# Patient Record
Sex: Male | Born: 1955
Health system: Southern US, Community
[De-identification: ages and names within clinical notes are randomized; demographics above are authoritative.]

## PROBLEM LIST (undated history)

## (undated) DIAGNOSIS — N189 Chronic kidney disease, unspecified: Secondary | ICD-10-CM

## (undated) DIAGNOSIS — I10 Essential (primary) hypertension: Secondary | ICD-10-CM

## (undated) DIAGNOSIS — D649 Anemia, unspecified: Secondary | ICD-10-CM

## (undated) DIAGNOSIS — G4733 Obstructive sleep apnea (adult) (pediatric): Secondary | ICD-10-CM

## (undated) DIAGNOSIS — G709 Myoneural disorder, unspecified: Secondary | ICD-10-CM

## (undated) DIAGNOSIS — K219 Gastro-esophageal reflux disease without esophagitis: Secondary | ICD-10-CM

## (undated) DIAGNOSIS — Z87442 Personal history of urinary calculi: Secondary | ICD-10-CM

## (undated) DIAGNOSIS — G4730 Sleep apnea, unspecified: Secondary | ICD-10-CM

## (undated) DIAGNOSIS — M1990 Unspecified osteoarthritis, unspecified site: Secondary | ICD-10-CM

## (undated) DIAGNOSIS — M810 Age-related osteoporosis without current pathological fracture: Secondary | ICD-10-CM

## (undated) DIAGNOSIS — L905 Scar conditions and fibrosis of skin: Secondary | ICD-10-CM

## (undated) DIAGNOSIS — M4800 Spinal stenosis, site unspecified: Secondary | ICD-10-CM

## (undated) DIAGNOSIS — L282 Other prurigo: Secondary | ICD-10-CM

## (undated) HISTORY — DX: Anemia, unspecified: D64.9

## (undated) HISTORY — DX: Unspecified osteoarthritis, unspecified site: M19.90

## (undated) HISTORY — PX: SPINE SURGERY: SHX786

## (undated) HISTORY — DX: Obstructive sleep apnea (adult) (pediatric): G47.33

## (undated) HISTORY — DX: Myoneural disorder, unspecified: G70.9

## (undated) HISTORY — DX: Sleep apnea, unspecified: G47.30

## (undated) HISTORY — DX: Morbid (severe) obesity due to excess calories: E66.01

## (undated) HISTORY — PX: CIRCUMCISION: SUR203

## (undated) HISTORY — DX: Spinal stenosis, site unspecified: M48.00

---

## 2000-10-14 DIAGNOSIS — L282 Other prurigo: Secondary | ICD-10-CM

## 2000-10-14 HISTORY — DX: Other prurigo: L28.2

## 2003-03-16 ENCOUNTER — Encounter: Payer: Self-pay | Admitting: Pulmonary Disease

## 2003-04-11 ENCOUNTER — Encounter: Payer: Self-pay | Admitting: Pulmonary Disease

## 2003-07-07 ENCOUNTER — Ambulatory Visit (HOSPITAL_COMMUNITY): Admission: RE | Admit: 2003-07-07 | Discharge: 2003-07-07 | Payer: Self-pay | Admitting: *Deleted

## 2003-07-07 ENCOUNTER — Encounter (INDEPENDENT_AMBULATORY_CARE_PROVIDER_SITE_OTHER): Payer: Self-pay | Admitting: Specialist

## 2008-03-07 ENCOUNTER — Inpatient Hospital Stay (HOSPITAL_COMMUNITY): Admission: EM | Admit: 2008-03-07 | Discharge: 2008-03-13 | Payer: Self-pay | Admitting: Emergency Medicine

## 2008-03-11 ENCOUNTER — Ambulatory Visit: Payer: Self-pay | Admitting: Vascular Surgery

## 2008-03-11 ENCOUNTER — Encounter (INDEPENDENT_AMBULATORY_CARE_PROVIDER_SITE_OTHER): Payer: Self-pay | Admitting: Internal Medicine

## 2008-08-07 ENCOUNTER — Emergency Department (HOSPITAL_COMMUNITY): Admission: EM | Admit: 2008-08-07 | Discharge: 2008-08-07 | Payer: Self-pay | Admitting: Emergency Medicine

## 2008-08-10 ENCOUNTER — Inpatient Hospital Stay (HOSPITAL_COMMUNITY): Admission: EM | Admit: 2008-08-10 | Discharge: 2008-08-23 | Payer: Self-pay | Admitting: Emergency Medicine

## 2008-08-10 ENCOUNTER — Ambulatory Visit: Payer: Self-pay | Admitting: Pulmonary Disease

## 2008-08-15 ENCOUNTER — Ambulatory Visit: Payer: Self-pay | Admitting: Thoracic Surgery

## 2008-08-30 ENCOUNTER — Ambulatory Visit: Payer: Self-pay | Admitting: Pulmonary Disease

## 2008-08-30 DIAGNOSIS — J869 Pyothorax without fistula: Secondary | ICD-10-CM | POA: Insufficient documentation

## 2008-08-30 DIAGNOSIS — G4730 Sleep apnea, unspecified: Secondary | ICD-10-CM | POA: Insufficient documentation

## 2008-08-30 DIAGNOSIS — L282 Other prurigo: Secondary | ICD-10-CM | POA: Insufficient documentation

## 2008-08-31 ENCOUNTER — Telehealth (INDEPENDENT_AMBULATORY_CARE_PROVIDER_SITE_OTHER): Payer: Self-pay | Admitting: *Deleted

## 2009-02-08 ENCOUNTER — Ambulatory Visit (HOSPITAL_COMMUNITY): Admission: RE | Admit: 2009-02-08 | Discharge: 2009-02-08 | Payer: Self-pay | Admitting: Urology

## 2009-02-08 ENCOUNTER — Encounter (INDEPENDENT_AMBULATORY_CARE_PROVIDER_SITE_OTHER): Payer: Self-pay | Admitting: Urology

## 2009-03-01 ENCOUNTER — Ambulatory Visit (HOSPITAL_COMMUNITY): Admission: RE | Admit: 2009-03-01 | Discharge: 2009-03-01 | Payer: Self-pay | Admitting: Family Medicine

## 2009-03-01 ENCOUNTER — Encounter (INDEPENDENT_AMBULATORY_CARE_PROVIDER_SITE_OTHER): Payer: Self-pay | Admitting: Family Medicine

## 2009-03-01 ENCOUNTER — Ambulatory Visit: Payer: Self-pay | Admitting: Cardiovascular Disease

## 2009-05-28 ENCOUNTER — Encounter: Admission: RE | Admit: 2009-05-28 | Discharge: 2009-05-28 | Payer: Self-pay | Admitting: Family Medicine

## 2010-01-01 ENCOUNTER — Encounter: Admission: RE | Admit: 2010-01-01 | Discharge: 2010-01-01 | Payer: Self-pay | Admitting: Specialist

## 2010-01-23 ENCOUNTER — Inpatient Hospital Stay (HOSPITAL_COMMUNITY): Admission: RE | Admit: 2010-01-23 | Discharge: 2010-02-07 | Payer: Self-pay | Admitting: Specialist

## 2010-01-23 HISTORY — PX: BACK SURGERY: SHX140

## 2010-01-25 ENCOUNTER — Ambulatory Visit: Payer: Self-pay | Admitting: Physical Medicine & Rehabilitation

## 2010-01-26 ENCOUNTER — Ambulatory Visit: Payer: Self-pay | Admitting: Infectious Diseases

## 2010-11-04 ENCOUNTER — Encounter: Payer: Self-pay | Admitting: Specialist

## 2011-01-01 LAB — BASIC METABOLIC PANEL
CO2: 26 mEq/L (ref 19–32)
Calcium: 8.4 mg/dL (ref 8.4–10.5)
Creatinine, Ser: 1.06 mg/dL (ref 0.4–1.5)
GFR calc Af Amer: >60 mL/min (ref >60)
Glucose, Bld: 114 mg/dL — ABNORMAL HIGH (ref 70–99)
Potassium: 3.5 mEq/L (ref 3.5–5.1)
Sodium: 137 mEq/L (ref 135–145)

## 2011-01-01 LAB — GLUCOSE, CAPILLARY
Glucose-Capillary: 103 mg/dL — ABNORMAL HIGH (ref 70–99)
Glucose-Capillary: 104 mg/dL — ABNORMAL HIGH (ref 70–99)
Glucose-Capillary: 105 mg/dL — ABNORMAL HIGH (ref 70–99)
Glucose-Capillary: 107 mg/dL — ABNORMAL HIGH (ref 70–99)
Glucose-Capillary: 109 mg/dL — ABNORMAL HIGH (ref 70–99)
Glucose-Capillary: 109 mg/dL — ABNORMAL HIGH (ref 70–99)
Glucose-Capillary: 117 mg/dL — ABNORMAL HIGH (ref 70–99)
Glucose-Capillary: 90 mg/dL (ref 70–99)
Glucose-Capillary: 94 mg/dL (ref 70–99)

## 2011-01-01 LAB — CBC
HCT: 27.6 % — ABNORMAL LOW (ref 39.0–52.0)
Hemoglobin: 9.2 g/dL — ABNORMAL LOW (ref 13.0–17.0)
MCHC: 33.2 g/dL (ref 30.0–36.0)
MCV: 77.8 fL — ABNORMAL LOW (ref 78.0–100.0)
MCV: 78 fL (ref 78.0–100.0)
Platelets: 322 10*3/uL (ref 150–400)
RDW: 16.3 % — ABNORMAL HIGH (ref 11.5–15.5)
WBC: 7.1 10*3/uL (ref 4.0–10.5)

## 2011-01-01 LAB — HIV ANTIBODY (ROUTINE TESTING W REFLEX): HIV: NONREACTIVE

## 2011-01-01 LAB — VANCOMYCIN, TROUGH: Vancomycin Tr: 34.1 ug/mL (ref 10.0–20.0)

## 2011-01-02 LAB — CBC
HCT: 31.2 % — ABNORMAL LOW (ref 39.0–52.0)
HCT: 31.6 % — ABNORMAL LOW (ref 39.0–52.0)
HCT: 38.6 % — ABNORMAL LOW (ref 39.0–52.0)
Hemoglobin: 10.3 g/dL — ABNORMAL LOW (ref 13.0–17.0)
Hemoglobin: 12.5 g/dL — ABNORMAL LOW (ref 13.0–17.0)
Platelets: 160 10*3/uL (ref 150–400)
Platelets: 249 10*3/uL (ref 150–400)
RBC: 4 MIL/uL — ABNORMAL LOW (ref 4.22–5.81)
RBC: 4.05 MIL/uL — ABNORMAL LOW (ref 4.22–5.81)
RDW: 16.7 % — ABNORMAL HIGH (ref 11.5–15.5)
RDW: 16.8 % — ABNORMAL HIGH (ref 11.5–15.5)
WBC: 4.5 10*3/uL (ref 4.0–10.5)
WBC: 5.8 10*3/uL (ref 4.0–10.5)
WBC: 6.6 10*3/uL (ref 4.0–10.5)

## 2011-01-02 LAB — COMPREHENSIVE METABOLIC PANEL
ALT: 14 U/L (ref 0–53)
AST: 17 U/L (ref 0–37)
Albumin: 3.9 g/dL (ref 3.5–5.2)
Alkaline Phosphatase: 99 U/L (ref 39–117)
BUN: 14 mg/dL (ref 6–23)
Chloride: 105 mEq/L (ref 96–112)
Potassium: 3.9 mEq/L (ref 3.5–5.1)
Sodium: 141 mEq/L (ref 135–145)
Total Bilirubin: 0.7 mg/dL (ref 0.3–1.2)
Total Protein: 7.3 g/dL (ref 6.0–8.3)

## 2011-01-02 LAB — DIFFERENTIAL
Basophils Absolute: 0 10*3/uL (ref 0.0–0.1)
Basophils Relative: 1 % (ref 0–1)
Eosinophils Absolute: 0.2 10*3/uL (ref 0.0–0.7)
Eosinophils Relative: 5 % (ref 0–5)
Monocytes Absolute: 0.5 10*3/uL (ref 0.1–1.0)
Monocytes Relative: 10 % (ref 3–12)
Neutro Abs: 2.6 10*3/uL (ref 1.7–7.7)

## 2011-01-02 LAB — BASIC METABOLIC PANEL
BUN: 12 mg/dL (ref 6–23)
CO2: 28 mEq/L (ref 19–32)
Chloride: 104 mEq/L (ref 96–112)
Creatinine, Ser: 0.87 mg/dL (ref 0.4–1.5)
GFR calc Af Amer: >60 mL/min (ref >60)
GFR calc Af Amer: >60 mL/min (ref >60)
GFR calc non Af Amer: >60 mL/min (ref >60)
Potassium: 3.5 mEq/L (ref 3.5–5.1)
Potassium: 4 mEq/L (ref 3.5–5.1)

## 2011-01-02 LAB — TYPE AND SCREEN
ABO/RH(D): A POS
Antibody Screen: NEGATIVE
Antibody Screen: NEGATIVE

## 2011-01-02 LAB — ABO/RH: ABO/RH(D): A POS

## 2011-01-02 LAB — URINALYSIS, ROUTINE W REFLEX MICROSCOPIC
Glucose, UA: NEGATIVE mg/dL
Hgb urine dipstick: NEGATIVE
Nitrite: NEGATIVE
Protein, ur: NEGATIVE mg/dL
Specific Gravity, Urine: 1.012 (ref 1.005–1.030)
Specific Gravity, Urine: 1.031 — ABNORMAL HIGH (ref 1.005–1.030)
Urobilinogen, UA: 1 mg/dL (ref 0.0–1.0)
pH: 5.5 (ref 5.0–8.0)

## 2011-01-02 LAB — URINE MICROSCOPIC-ADD ON

## 2011-01-02 LAB — URINE CULTURE

## 2011-01-02 LAB — PROTIME-INR: Prothrombin Time: 13 seconds (ref 11.6–15.2)

## 2011-01-02 LAB — MRSA PCR SCREENING: MRSA by PCR: NEGATIVE

## 2011-01-02 LAB — POCT I-STAT 4, (NA,K, GLUC, HGB,HCT)
Hemoglobin: 10.5 g/dL — ABNORMAL LOW (ref 13.0–17.0)
Potassium: 4 mEq/L (ref 3.5–5.1)

## 2011-01-23 ENCOUNTER — Inpatient Hospital Stay (INDEPENDENT_AMBULATORY_CARE_PROVIDER_SITE_OTHER)
Admission: RE | Admit: 2011-01-23 | Discharge: 2011-01-23 | Disposition: A | Discharge status: Home / Self Care | Payer: Federal, State, Local not specified - PPO | Source: Ambulatory Visit | Attending: Family Medicine | Admitting: Family Medicine

## 2011-01-23 DIAGNOSIS — R509 Fever, unspecified: Secondary | ICD-10-CM

## 2011-01-23 LAB — CBC
HCT: 36.4 % — ABNORMAL LOW (ref 39.0–52.0)
MCHC: 32.5 g/dL (ref 30.0–36.0)
MCV: 76.9 fL — ABNORMAL LOW (ref 78.0–100.0)
Platelets: 218 10*3/uL (ref 150–400)
RBC: 4.74 MIL/uL (ref 4.22–5.81)

## 2011-01-23 LAB — BASIC METABOLIC PANEL
BUN: 16 mg/dL (ref 6–23)
CO2: 27 mEq/L (ref 19–32)
Chloride: 103 mEq/L (ref 96–112)
Creatinine, Ser: 0.79 mg/dL (ref 0.4–1.5)
Potassium: 3.6 mEq/L (ref 3.5–5.1)

## 2011-02-26 NOTE — Op Note (Signed)
[name] [name], [name] [name]                ACCOUNT NO.:  [id number]   MEDICAL RECORD NO.:  [id number]          PATIENT TYPE:  AMB   LOCATION:  DAY                          FACILITY:  [facility]   PHYSICIAN:  [name] [name], M.D.  DATE OF BIRTH:  [birth year]   DATE OF PROCEDURE:  02/08/2009  DATE OF DISCHARGE:                               OPERATIVE REPORT   PREOPERATIVE DIAGNOSIS:  Phimosis secondary to massive penile skin  edema.   POSTOPERATIVE DIAGNOSIS:  Phimosis secondary to massive penile skin  edema.   PROCEDURE PERFORMED:  Complex circumcision.   SURGEON:  [name] [name], M.D.   RESIDENTGeorgeanna [name].   ANESTHESIA:  MAC.   DRAINS:  None.   COMPLICATIONS:  None.   ESTIMATED BLOOD LOSS:  Minimal.   INDICATIONS:  The patient is a 55 year old African American male who was  referred by his primary care doctor to Dr. Isabel [name] for evaluation of  erectile dysfunction and penile skin edema with resultant phimosis.  On  evaluation, the patient was found to have massively edematous distal  penile skin.  As a result, his foreskin could not be retracted and he  had phimosis.  The patient also has erectile dysfunction and some issues  with his urinating where he dribbles post urination secondary to his  penile skin changes.  Different treatment options were discussed with  him.  The patient opted for circumcision.  The risks and benefits of the  procedure were explained.  Informed consent obtained.   DESCRIPTION OF PROCEDURE IN DETAIL:  The patient was brought to the  operating room and placed in the supine position.  The patient was  administered MAC anesthesia by the anesthesia team.  Proper time-outs  were performed identifying patient, procedure, and the site.  The  patient was given appropriate preoperative antibiotics.  The patient's  genital area was prepped and draped.  The patient was given penile  block.  After testing that the block had taken effect, we began by  noting a  position along the penile skin of his glans distal and the  glans was marked on his foreskin.  We then began by clamping the dorsal  skin between 2 Kelly clamps.  We used Bovie electrocautery to divide in  between.  His distal foreskin, while using Bovie electrocautery to  divide, was found to be fibrotic and edematous.  Once we had the dorsal  slit down enough that we could retract the foreskin, we retracted the  foreskin and evaluated his glans, which appeared normal in appearance.  We then marked out our mucosal collar.  Incision was made with a  surgical knife on the mucosal collar.  We then evaluated his penile skin  and marked the amount of foreskin to be resected.  Again, we made a  circumferential incision on the penile skin.  We then continued our  dorsal resection of the penile skin and joined our incision on the  penile skin.  We then circumferentially resected the excess foreskin via  Bovie electrocautery.  We then achieved hemostasis with the help of  Bovie  electrocautery.  We then began suturing the residual penile skin  to the mucosal collar using 4-0 Vicryl suture.  We first applied 4 stay  sutures at 12 o'clock, 6 o'clock, 9 o'clock, and 3 o'clock positions.  We then applied interrupted sutures of 4-0 Vicryl in between these  sutures.  At that point, once we had applied all the sutures, we cut the  stay sutures.  We then cleaned the patient's penis and applied Xeroform  dressing with bacitracin ointment and a coban dressing over it.  We  secured the coban dressing at the base of the penis.  This marked the  end of the procedure.  The patient was subsequently transferred in  stable condition to the recovery room.  Of note, Dr. Isabel [name] was present  and available for all aspects of the case. This case was much more  complicated and difficult then a routine circumcision due to the  severity of the penile thickening and edema.     ______________________________  [name] [name], M.D.  Electronically Signed    JJ/MEDQ  D:  02/08/2009  T:  02/08/2009  Job:  [id number]

## 2011-02-26 NOTE — Discharge Summary (Signed)
[name] [name]                ACCOUNT NO.:  [id number]   MEDICAL RECORD NO.:  [id number]          PATIENT TYPE:  INP   LOCATION:  3711                         FACILITY:  [facility]   PHYSICIAN:  Oretha Milch, MD      DATE OF BIRTH:  August 22, 1956   DATE OF ADMISSION:  08/10/2008  DATE OF DISCHARGE:  08/23/2008                               DISCHARGE SUMMARY   DISCHARGE DIAGNOSES:  Persistent left empyema with group B strep.   HISTORY OF PRESENT ILLNESS:  [name] [name] is a 55 year old morbidly obese  African American gentleman who presented with left shoulder pain, left  chest pain for two weeks and increasing dyspnea.  In the emergency  department, he was noted to have a temperature of 102 and chest x-ray  was consistent with a loculated empyema.  A left  chest tube was  inserted per critical care.  He was admitted for further evaluation and  treatment.   PAST MEDICAL HISTORY:  1. Chronic leg edema.  2. Degenerative joint arthritis.  3. Morbid obesity.   ALLERGIES:  No known drug allergies.   LABORATORY DATA:  Cultures from the chest tube demonstrated group B  strep.   RADIOGRAPHIC DATA:  Last chest x-ray on August 23, 2008 shows no  significant change.  Stable pleural fluid in the left lung base over the  left apex.  Central venous mild interstitial edema.  CT scan on November  7 demonstrated CT-guided drainage of left pleural empyema.  Sodium 136,  potassium 4, chloride 100, CO2 30, BUN 11, creatinine 0.88, glucose 101,  hemoglobin 9.8, hematocrit 29.9, WBC 6.4, platelets 432, MCV 73.5.  Calcium 8.5.  Total glucose  was less than 20 pleural fluid.  Total  protein was 5.3 in pleural fluid.  LDH of fluid was 14,912.  Fluid pH  was not done.   HOSPITAL COURSE:  1. Left empyema with group B strep.  He presented to the [facility]  with the aforementioned symptoms. He had a left chest      tube placed per Dr. Koren Bound and Zenia Resides, nurse      practitioner  on August 10, 2008.  It was draining a significant      amount of fluid.  This was positive for group B strep.  Despite the      empyema being drained, there was a loculated area, anterior portion      of the left lung.  Therefore, a consult was called with Dr. Karle Plumber.  It was his suggestion the patient was taken the      interventional radiology and a had a pig-tailed catheter placed      from November 7 to November 9 for further drainage of the fluid      which did not grow any further group B strep.  At that time he was      continued on antimicrobial therapy and will be discharged after      being treated with vancomycin, Zosyn and Unasyn and changed  to      Cleocin.  He will be on  Cleocin 600 mg p.o. b.i.d. for further      eight days to complete 21 days of antimicrobial therapy.  He will      follow up with Dr. Cyril Mourning in the office on August 30, 2008      at 10 a.m.  for chest x-ray and further evaluation of his empyema.      He will also follow up with Dr. Karle Plumber.  He is to call for      an appointment.  He is to follow with Dr. Lucie Leather and Dr. Loleta Chance in      the near future as they manage his other health problems.  2. Degenerative joint disease.  He remains on Ultram and he was      started no Clinoril.  3. For his morbid obesity, he has been suggested on diet control.  4. For his leg swelling, again he was given suggestions but not to      take Lasix at this time.   He has followup appointments.  He is on a low sodium, low carbohydrate  diet.  His left chest tube insertion site has that remain.  These will  be removed on his followup visit with Dr. Vassie Loll.   DISPOSITION:  Condition on discharge is improved.      Devra Dopp, MSN, ACNP      Oretha Milch, MD  Electronically Signed    SM/MEDQ  D:  08/23/2008  T:  08/23/2008  Job:  [id number]   cc:   Ines Bloomer, M.D.  Annia Friendly. Loleta Chance, MD  Jessica Priest, M.D.

## 2011-02-26 NOTE — Consult Note (Signed)
[name] [name], [name] [name]                ACCOUNT NO.:  [id number]   MEDICAL RECORD NO.:  [id number]          PATIENT TYPE:  INP   LOCATION:                               FACILITY:  [facility]   PHYSICIAN:  Ines Bloomer, M.D. DATE OF BIRTH:  28-Jul-1956   DATE OF CONSULTATION:  DATE OF DISCHARGE:                                 CONSULTATION   CHIEF COMPLAINT:  Empyema.   This 55 year old morbidly obese African American male was known to have  a left shoulder and left chest pain for two weeks with increased  dyspnea.  He came to the emergency room with a temp of 102, a loculated  consistent with an empyema.  Left chest tube was inserted.  A cultured  out Group B strep.  He has been on prednisone and by Dr. Lucie Leather has been  treated with degenerative arthritis, also he has poor dentition and  multiple skin lesions.  He has also had a history of ethanol abuse in  the past.  Apparently, he has a history of rheumatoid arthritis and a  history of collagen vascular disease.   PAST MEDICAL HISTORY:  Significant for chronic leg edema, degenerative  joint arthritis, and morbid obesity.   Takes furosemide, potassium, meloxicam, ibuprofen, and Vicodin.  In the  hospital, he has been started on heparin, Clinoril, Protonix, Ultram,  Unasyn, Lortab, Ambien, and fentanyl.   He has no allergies.  He also has hypertension.   SOCIAL HISTORY:  Negative for smoking and positive for rare alcohol  abuse.   FAMILY HISTORY:  Noncontributory.   REVIEW OF SYSTEMS:  CARDIAC:  No angina or atrial fibrillation.  PULMONARY:  See history of present illness.  No hemoptysis.  GI:  No  nausea, vomiting, constipation, or diarrhea.  GU:  No dysuria or  frequent urination.  VASCULAR:  No claudication, DVT, or TIA.  NEUROLOGICAL:  No dizziness, headaches, blackout, or seizures.  MUSCULOSKELETAL:  See history of present illness.  EYES/ENT:  No change  in his eyesight or hearing.  HEMATOLOGICAL:  He has had anemia.   PHYSICAL EXAMINATION:  VITAL SIGNS:  His blood pressure is 140/80, temp  99, respirations 18, and sat 92%.  GENERAL:  Alert, African American male, in no acute distress.  HEAD, EYES, EARS, NOSE, AND THROAT:  Unremarkable except for poor  dentition.  NECK:  Supple without thyromegaly.  There is no supraclavicular or  axillary adenopathy.  CHEST:  Decreased breath sounds on the left.  HEART:  Regular rate and sinus rhythm.  No murmur.  ABDOMEN:  Mildly obese, no palpable mass.  EXTREMITIES:  2+ edema, also 1+ pulses, multiple areas of excoriation  secondary to chronic itching.  NEUROLOGICAL:  He is oriented x3.   IMPRESSION:  1. Morbid obesity.  2. Left chest empyema.  3. History of ethanol abuse.  4. History of rheumatoid arthritis __________ of active disease..  5. History of chronic prednisone therapy.  6. Possible skin excoriation.      Ines Bloomer, M.D.  Electronically Signed     DPB/MEDQ  D:  08/15/2008  T:  08/16/2008  Job:  [id number]

## 2011-02-26 NOTE — H&P (Signed)
[name] [name], [name] [name]                ACCOUNT NO.:  [id number]   MEDICAL RECORD NO.:  [id number]          PATIENT TYPE:  EMS   LOCATION:  MAJO                         FACILITY:  [facility]   PHYSICIAN:  Hettie Holstein, D.O.    DATE OF BIRTH:  06-23-1956   DATE OF ADMISSION:  03/07/2008  DATE OF DISCHARGE:                              HISTORY & PHYSICAL   PRIMARY CARE PHYSICIAN:  Juline Patch, M.D.   PRIMARY ORTHOPEDIC SURGEON:  Dr. Oleta Mouse.   CHIEF COMPLAINT:  Weakness status post fall and pain in the left lower  extremity.   HISTORY OF PRESENT ILLNESS:  [name] [name] is a very pleasant though  morbidly obese 55 year old male with history significant for  hypertension and morbid obesity, who had chronic back pain who sustained  a fall last Saturday where he had been ambulating with a walker back  from his bathroom and fell to the ground on his buttocks.  He did  attempt at some prescribed nonsteroidal anti-inflammatories in any  event.  He reports that he was not able to get out of his bed for the  past 3 days because of extremity weakness.  He had sustained a abrasion  and a wound on his left knee area.  In the emergency department, he was  seen in evaluation by Dr. Bruce Donath.  A rectal exam was performed,  which was unremarkable.  [name] [name] maintains that his legs have been  weak and on clinical examination is the most specifically his proximal  musculature.  However, this only involves his lower extremities and  upper extremity proximal muscles are normal as well as his distal  strength in his lower extremities in any event.   PAST MEDICAL HISTORY:  Significant for chronic nodular and pruritc skin.   CONDITION:  1. I believe this may be prurigo for which he follows with a      dermatologist.  2. Hypertension.  3. Obesity.  4. Chronic back pain.   MEDICATIONS:  1. That he has been taking is diazepam 5 mg p.r.n.  2. Lasix 40 mg twice daily.  3. Klor-Con 20 mEq twice daily.  4. Mobic  7.5 mg twice a day.   ALLERGIES:  No known drug allergies.   SOCIAL HISTORY:  Does not work, has a 98 year old daughter.  He is  married.  He drinks only very seldom alcohol.  No recreational drugs.   FAMILY HISTORY:  His mother died at age 57 with complications of  diabetes.  His father died in his 33s with some heart disease.   REVIEW OF SYSTEMS:  Unremarkable with exception of constipation.  He has  had no bloody stools.  No urinary difficulty.  No shortness of breath or  chest pain and otherwise further review of 12 systems is negative.   PHYSICAL EXAMINATION:  VITAL SIGNS: His blood pressure is 104/67,  temperature 97.8, heart rate 86, respirations 16, O2 saturation 98%.  GENERAL: The patient to be alert and oriented x3.  HEENT: Reveals head to be normocephalic, atraumatic.  Extraocular  muscles appear to be intact.  NECK:  Supple, nontender.  No palpable thyromegaly or mass.  CARDIOVASCULAR: Reveals normal S1 and S2, very distant heart sounds  related to his large habits.  LUNGS:  Clear bilaterally and there is no dullness to percussion  exhibits normal effort.  ABDOMEN:  Soft, obese, and nontender.  LOWER EXTREMITIES: Reveal a lesion on his left lower extremity.  There  is no drainage that appears to be some early scabbing and clear dry  serofibrinous tissues was appears to be forming.  There is left lower  extremity warmth and erythema extending about his the middle of his left  leg.  There is no calf tenderness.  NEUROLOGIC: Revealed focal deficits on his examination, is quite  difficult to ascertain. He has about +3/5 muscle strength of his right  and left lower extremities.  Movement is quite limited due to this  morbid obesity.  However, distal musculature appears to be intact and  his deep tendon reflexes are present.  Patellar appeared to be intact.  I did not understand [name] [name], his habitus that puts him at the risk of  in-hospital fall.  He weighs over 400+ pounds  in event.   LABORATORY DATA:  Is as follows; sodium was 139, potassium 40, BUN 26,  creatinine 1.0, glucose is 118, ionized calcium within  normal range.  WBC of 54, hemoglobin 12, platelet count 228, and MCV of 80.   IMAGING STUDIES:  Were limited due to [name] [name] habitus unfortunately.   ASSESSMENT:  1. Cellulitis, left lower extremity.  2. Proximal lower extremity weakness.  3. Morbid obesity.  4. Nodular and pruritc dermatitis (probably prurigo).  5. Hypertension.  6. Immobility secondary to morbid obesity.  7. Constipation.   PLAN:  At this time [name] [name] will be admitted.  We will employ the  assistance of physical therapy to assess his mobility and certainly  believe great deal of his limited mobility is related to his morbid  obesity and probable deconditioning.  He was dependent upon walker prior  to arrival.  This certainly may be a decompensation exacerbated by his  recent left lower extremity injury.  We will initiate IV antibiotics and  continue his home medications.      Hettie Holstein, D.O.  Electronically Signed     ESS/MEDQ  D:  03/07/2008  T:  03/07/2008  Job:  [id number]   cc:   Juline Patch, M.D.

## 2011-02-26 NOTE — Discharge Summary (Signed)
[name] [name], [name] [name]                ACCOUNT NO.:  [id number]   MEDICAL RECORD NO.:  [id number]          PATIENT TYPE:  INP   LOCATION:  5529                         FACILITY:  [facility]   PHYSICIAN:  Altha Harm, MDDATE OF BIRTH:  1956/01/11   DATE OF ADMISSION:  03/07/2008  DATE OF DISCHARGE:  03/13/2008                               DISCHARGE SUMMARY   DISCHARGE DIAGNOSES:  1. Cellulitis of the left lower extremity.  2. Osteoarthritis of the left knee.  3. Morbid obesity.  4. Chronic back pain.  5. Prurigo.  6. Lower extremity edema, chronic.   DISCHARGE MEDICATIONS:  1. Doxycycline 100 mg p.o. b.i.d.  2. Percocet 7.5/500 mg p.o. q.4 h. p.r.n.  3. Lasix 40 mg p.o. b.i.d.  4. Potassium 20 mEq p.o. b.i.d.  5. Diazepam 5 mg p.o. t.i.d. p.r.n.  6. Meloxicam 7.5 mg p.o. one to two tablets daily.  7. Protopic topical cream applied up to t.i.d.   CONSULTANTS:  None.   PROCEDURES:  None.   DIAGNOSTIC STUDIES:  1. Duplex ultrasound of the left extremity which shows no evidence of      DVT.  2. X-ray of the left knee which shows advanced degenerative disease.      No acute findings.   CODE STATUS:  Full code.   ALLERGIES:  NO KNOWN DRUG ALLERGIES.   CHIEF COMPLAINT:  Weakness, status post fall and pain in the left lower  extremity.   HISTORY OF PRESENT ILLNESS:  Please see the H&P dictated by Dr. Garnette Czech  for details of the HPI.   HOSPITAL COURSE:  PROBLEM #1 - CELLULITIS OF THE LEFT LOWER EXTREMITY:  The patient was admitted after falling and inability to ambulate.  Upon  admission, he was found to have a cellulitis of the left lower extremity  and was started on IV antibiotics of Keflex.  The patient was  transitioned from IV Keflex to oral antibiotic; however, he had  worsening of symptoms on the oral antibiotics.  He was resumed on IV  clindamycin for 48 hours; this was then changed to p.o. form of  medications and the patient continued to improve without any  difficulty.  Thus, the patient is being discharged home on doxycycline 100 mg p.o.  b.i.d.  It is likely the patient does have some staph infection in the  left lower extremity where he has a cellulitis.  The only other issue  affecting the patient was in an area of wound in the left knee which was  secondary to the fall.  The patient was seen by wound/ostomy care nurse  and she recommended stanozolol to debride the wound daily.  The patient  is to apply stanozolol to the wound on a daily.  We will ask the home  care nurses to go out to the home to assess the patient's wound as it  improves.   PROBLEM #2 - OSTEOARTHRITIS OF THE LEFT KNEE:  The patient has had  difficulty ambulating and in particular, had a lot of pain.  Physical  Therapy saw the patient and recommended that he have bariatric  immobilizer to the left knee; however, there was none available here in  the hospital.  I am recommending that the patient have a neoprene sleeve  at the very least to the left knee and be referred for outpatient  physical therapy; however, I will defer that to his primary care  physician.   The patient states that is in the process of having a workup of his  chronic back pain and open MRI as an outpatient, which he will pursue  with his primary care physician as an outpatient.   DISCHARGE MEDICATIONS:  In terms of his other chronic medications, they  remain the same and the patient is resumed on his usual medications.   DIETARY RESTRICTIONS:  I have discussed with the patient his need for  pursuing a diet which would promote weight loss, which would be a  calorie-restricted diet.   PHYSICAL RESTRICTIONS:  As tolerated.  The patient will need a walker  for ambulating and we will order before the patient leaves for home.   FOLLOWUP:  The patient is to follow up with his primary care physician,  Dr. Ricki Miller, in 3-5 days to assess the resolution of the cellulitis and to  further evaluate his  chronic pain.      Altha Harm, MD  Electronically Signed     MAM/MEDQ  D:  03/13/2008  T:  03/13/2008  Job:  [id number]   cc:   Juline Patch, M.D.

## 2011-02-26 NOTE — H&P (Signed)
[name], [name]                ACCOUNT NO.:  [id number]   MEDICAL RECORD NO.:  [id number]          PATIENT TYPE:  INP   LOCATION:  3707                         FACILITY:  [facility]   PHYSICIAN:  Casimiro Needle B. Sherene Sires, MD, FCCPDATE OF BIRTH:  [birth year]   DATE OF ADMISSION:  08/10/2008  DATE OF DISCHARGE:                              HISTORY & PHYSICAL   CHIEF COMPLAINT:  Chest pain and dyspnea.   HISTORY:  This is a 55 year old black male with morbid obesity  complicated by degenerative arthritis mostly in the knees with  increasing left shoulder and posterior chest pain for the last 2 weeks,  worse with deep breathing, abruptly worse with dyspnea and fever the  last 24 hours, and came to the emergency room with temperature of 102  and evidence of a loculated left pleural effusion consistent with an  empyema.   The patient has a chronic skin rash that he scratches all the time with  multiple areas of broken skin, but denies any painful skin lesions.  He  denies any recent dental work or dental problems, but has poor dentition  (see exam).  He also been a heavy drinker with last drink about 2 weeks  ago got tired.  He has no history of any DTs, other rheumatoid  complaints including rheumatoid arthritis or history of a collagen  vascular disease.  Previously on prednisone under Dr. Kathyrn Lass care.  No  prednisone for the last 6 months.   PAST HISTORY:  Significant for morbid obesity, chronic leg edema, and  degenerative arthritis.   Medications include furosemide, potassium, meloxicam, ibuprofen, and  Vicodin.  The patient says the only medicine he ever takes is the one  that starts with a D.  He denies taking any Valium or nerve pills.   ALLERGIES:  None known.   SOCIAL HISTORY:  He denies history of smoking.  He has been a drinker as  noted above.   Family history is negative for respiratory disease, rheumatologic  disease, positive diabetes in mother, negative for cancer.   REVIEW OF SYSTEMS:  Taken detail and negative except as outlined above.   PHYSICAL EXAMINATION:  GENERAL:  This is a massively obese black male,  able to speak in full sentences.  VITAL SIGNS:  He is afebrile.  His temperature is 102 with a blood  pressure repeated at 134/74.  HEENT:  Remarkable for poor dentition, but no obvious abscesses.  NECK:  Supple without cervical adenopathy or tenderness.  Trachea is  midline.  There is no thyromegaly.  LUNGS:  Diminished breath sounds on the left with dullness.  HEART:  Regular rhythm with distant S1 and S2.  No rub.  ABDOMEN:  Massively obese, but otherwise benign.  EXTREMITIES:  Warm without calf tenderness, cyanosis, or clubbing.  There was trace edema bilaterally.  He had multiple excoriations on both  upper extremities with hyperpigmentation as well consistent with chronic  secondary changes from scratching.  No areas of tenderness or erythema.  He had full range of motion in the left shoulder.   Chest x-ray showed a definite loculated  left pleural effusion tracking  up along the lateral chest wall.   Lab studies are pending.   IMPRESSION:  Classic empyema in this patient with history of heavy  alcohol use, poor dentition, and also multiple skin lesions placing him  at risk of hematogenous seeding of the pleural space.  I strongly favor  aspiration mechanism given the appearance of the x-ray.   RECOMMENDATION:  1. Proceed with a diagnostic thoracentesis, but most likely this      patient will need decortication.  2. Massive obesity.  We will make his medical care difficult.  A TSH      will be checked for reversible etiology.      Charlaine Dalton. Sherene Sires, MD, [facility]  Electronically Signed     MBW/MEDQ  D:  08/10/2008  T:  08/10/2008  Job:  [id number]

## 2011-03-01 NOTE — Op Note (Signed)
   NAMEYOUSEF, HUGE                            ACCOUNT NO.:  [id number]   MEDICAL RECORD NO.:  [id number]                   PATIENT TYPE:  AMB   LOCATION:  ENDO                                 FACILITY:  [facility]   PHYSICIAN:  Georgiana Spinner, M.D.                 DATE OF BIRTH:  [birth year]   DATE OF PROCEDURE:  07/07/2003  DATE OF DISCHARGE:                                 OPERATIVE REPORT   PROCEDURE PERFORMED:  Colonoscopy.   ENDOSCOPIST:  Georgiana Spinner, M.D.   INDICATIONS FOR PROCEDURE:  Rectal bleeding.   ANESTHESIA:  None further given.   DESCRIPTION OF PROCEDURE:  With the patient mildly sedated in the left  lateral decubitus position, the Olympus video colonoscope was inserted in  the rectum and passed under direct vision to the cecum, identified by the  ileocecal valve and appendiceal orifice, both of which were photographed.  From this point the colonoscope was slowly withdrawn taking circumferential  views of the entire colonic mucosa stopping only in the rectum which  appeared normal on direct and retroflex view.  The endoscope was  straightened and withdrawn.  The patient's vital signs and pulse oximeter  remained stable.  The patient tolerated the procedure well without apparent  complications.   FINDINGS:  Unremarkable examination.   PLAN:  See endoscopy note for further details.                                                 Georgiana Spinner, M.D.    GMO/MEDQ  D:  07/07/2003  T:  07/08/2003  Job:  [id number]

## 2011-03-01 NOTE — Op Note (Signed)
   [name], [name]                            ACCOUNT NO.:  [id number]   MEDICAL RECORD NO.:  [id number]                   PATIENT TYPE:  AMB   LOCATION:  ENDO                                 FACILITY:  [facility]   PHYSICIAN:  Georgiana Spinner, M.D.                 DATE OF BIRTH:  15-Jun-1956   DATE OF PROCEDURE:  DATE OF DISCHARGE:                                 OPERATIVE REPORT   PROCEDURE:  Upper endoscopy.   INDICATIONS:  Hemoccult positivity.   ANESTHESIA:  Demerol 70, Versed 7 mg.   PROCEDURE:  With the patient mildly sedated in the left lateral decubitus  position, the Olympus videoscopic endoscope was inserted in the mouth and  passed under direct vision through the esophagus, appeared normal, into the  stomach.  The fundus and body appeared normal.  The antrum showed some small  ulcerations with surrounding erythema.  These were photographed and  biopsied.  The duodenal bulb and second portion of the duodenum appeared  normal.  From this point, the endoscope was slowly withdrawn, taking  circumferential views of the duodenal mucosa through the endoscope and  pulled back and the stomach placed in retroflexion, viewing the stomach from  below.  The endoscope was then straightened and withdrawn, taking  circumferential views in the remaining gastric and esophageal mucosa.  The  patient's vital signs and pulse oximeter remained stable.  The patient  tolerated the procedure well without apparent complications.   FINDINGS:  Ulcerations of the antrum, biopsied.  Await biopsy report.  The  patient will call me for results and follow up with me as an outpatient.  Would avoid NSAIDs and start the patient on PPI.                                               Georgiana Spinner, M.D.    GMO/MEDQ  D:  07/07/2003  T:  07/08/2003  Job:  [id number]

## 2011-03-01 NOTE — Assessment & Plan Note (Signed)
OFFICE VISIT   EVELYN, MOCH  DOB:  12/07/1955                                        September 12, 2008  CHART #:  [id number]   The patient was seen in consultation for an empyema while at [facility] and was set up to follow up of for his empyema after discharge.  He was called on and was set up for 2 appointments and he canceled both  of them and has called and states that he does not want to reschedule  and does not want to see [place] again.  He was informed that this was  without a  followup visit.  He could have a relapse of his empyema.  Hopefully, he is aware of that.   Ines Bloomer, M.D.  Electronically Signed   DPB/MEDQ  D:  09/12/2008  T:  09/12/2008  Job:  [id number]

## 2011-07-10 LAB — BASIC METABOLIC PANEL
BUN: 15
BUN: 15
BUN: 15
CO2: 28
CO2: 30
Calcium: 8.8
Calcium: 9.1
Creatinine, Ser: 0.89
Creatinine, Ser: 0.92
Creatinine, Ser: 0.99
GFR calc Af Amer: >60
GFR calc non Af Amer: >60
GFR calc non Af Amer: >60
GFR calc non Af Amer: >60
Glucose, Bld: 118 — ABNORMAL HIGH
Glucose, Bld: 120 — ABNORMAL HIGH
Potassium: 4
Sodium: 137

## 2011-07-10 LAB — DIFFERENTIAL
Basophils Absolute: 0
Basophils Absolute: 0
Basophils Relative: 0
Eosinophils Absolute: 0.1
Eosinophils Relative: 2
Eosinophils Relative: 4
Lymphocytes Relative: 24
Lymphs Abs: 0.9
Lymphs Abs: 1.1
Monocytes Absolute: 0.5
Neutro Abs: 3.8
Neutrophils Relative %: 59
Neutrophils Relative %: 76

## 2011-07-10 LAB — POCT I-STAT, CHEM 8
Creatinine, Ser: 1
HCT: 39
Hemoglobin: 13.3
Potassium: 4.2
Sodium: 139
TCO2: 31

## 2011-07-10 LAB — CBC
HCT: 37.2 — ABNORMAL LOW
Hemoglobin: 11.5 — ABNORMAL LOW
MCHC: 33.2
MCV: 80.2
Platelets: 190
Platelets: 198
Platelets: 228
RBC: 4.52
RDW: 15.5
RDW: 16 — ABNORMAL HIGH
WBC: 4.5
WBC: 5.4

## 2011-07-10 LAB — HEMOGLOBIN A1C
Hgb A1c MFr Bld: 6.4 — ABNORMAL HIGH
Mean Plasma Glucose: 151

## 2011-07-10 LAB — SEDIMENTATION RATE: Sed Rate: 13

## 2011-07-16 LAB — GLUCOSE, SEROUS FLUID: Glucose, Fluid: <20 mg/dL

## 2011-07-16 LAB — BLOOD GAS, ARTERIAL
Bicarbonate: 27.7 mEq/L — ABNORMAL HIGH (ref 20.0–24.0)
O2 Content: 6 L/min
Patient temperature: 98.6
pCO2 arterial: 45.2 mmHg — ABNORMAL HIGH (ref 35.0–45.0)
pH, Arterial: 7.404 (ref 7.350–7.450)
pO2, Arterial: 96.9 mmHg (ref 80.0–100.0)

## 2011-07-16 LAB — DIFFERENTIAL
Basophils Absolute: 0 10*3/uL (ref 0.0–0.1)
Basophils Relative: 0 % (ref 0–1)
Lymphocytes Relative: 4 % — ABNORMAL LOW (ref 12–46)
Monocytes Relative: 7 % (ref 3–12)
Neutro Abs: 24.7 10*3/uL — ABNORMAL HIGH (ref 1.7–7.7)

## 2011-07-16 LAB — BODY FLUID CULTURE: Culture: NO GROWTH

## 2011-07-16 LAB — POCT I-STAT 3, ART BLOOD GAS (G3+)
Bicarbonate: 25.7 mEq/L — ABNORMAL HIGH (ref 20.0–24.0)
O2 Saturation: 96 %
Patient temperature: 37
TCO2: 27 mmol/L (ref 0–100)
pH, Arterial: 7.443 (ref 7.350–7.450)

## 2011-07-16 LAB — BASIC METABOLIC PANEL
CO2: 30 mEq/L (ref 19–32)
Chloride: 102 mEq/L (ref 96–112)
Chloride: 100 mEq/L (ref 96–112)
Chloride: 100 mEq/L (ref 96–112)
GFR calc Af Amer: >60 mL/min (ref >60)
GFR calc Af Amer: >60 mL/min (ref >60)
GFR calc non Af Amer: >60 mL/min (ref >60)
GFR calc non Af Amer: >60 mL/min (ref >60)
Glucose, Bld: 113 mg/dL — ABNORMAL HIGH (ref 70–99)
Potassium: 4.1 mEq/L (ref 3.5–5.1)
Potassium: 4 mEq/L (ref 3.5–5.1)
Potassium: 4 mEq/L (ref 3.5–5.1)
Potassium: 4.2 mEq/L (ref 3.5–5.1)
Sodium: 135 mEq/L (ref 135–145)
Sodium: 136 mEq/L (ref 135–145)
Sodium: 136 mEq/L (ref 135–145)
Sodium: 137 mEq/L (ref 135–145)

## 2011-07-16 LAB — CBC
HCT: 36.3 % — ABNORMAL LOW (ref 39.0–52.0)
HCT: 29.9 % — ABNORMAL LOW (ref 39.0–52.0)
HCT: 30.8 % — ABNORMAL LOW (ref 39.0–52.0)
HCT: 31.6 % — ABNORMAL LOW (ref 39.0–52.0)
Hemoglobin: 11.9 g/dL — ABNORMAL LOW (ref 13.0–17.0)
Hemoglobin: 10.2 g/dL — ABNORMAL LOW (ref 13.0–17.0)
Hemoglobin: 9.8 g/dL — ABNORMAL LOW (ref 13.0–17.0)
Hemoglobin: 9.9 g/dL — ABNORMAL LOW (ref 13.0–17.0)
MCHC: 32.4 g/dL (ref 30.0–36.0)
MCHC: 32.6 g/dL (ref 30.0–36.0)
MCV: 74.1 fL — ABNORMAL LOW (ref 78.0–100.0)
MCV: 74.8 fL — ABNORMAL LOW (ref 78.0–100.0)
MCV: 73.5 fL — ABNORMAL LOW (ref 78.0–100.0)
MCV: 74.6 fL — ABNORMAL LOW (ref 78.0–100.0)
Platelets: 319 10*3/uL (ref 150–400)
RBC: 4.91 MIL/uL (ref 4.22–5.81)
RBC: 4.07 MIL/uL — ABNORMAL LOW (ref 4.22–5.81)
RDW: 20 % — ABNORMAL HIGH (ref 11.5–15.5)
RDW: 19.1 % — ABNORMAL HIGH (ref 11.5–15.5)
WBC: 27.7 10*3/uL — ABNORMAL HIGH (ref 4.0–10.5)
WBC: 8.1 10*3/uL (ref 4.0–10.5)

## 2011-07-16 LAB — BODY FLUID CELL COUNT WITH DIFFERENTIAL
Eos, Fluid: 2 %
Lymphs, Fluid: 0 %
Neutrophil Count, Fluid: 96 % — ABNORMAL HIGH (ref 0–25)
Total Nucleated Cell Count, Fluid: 16850 cu mm — ABNORMAL HIGH (ref 0–1000)

## 2011-07-16 LAB — ANAEROBIC CULTURE

## 2011-07-16 LAB — AMMONIA: Ammonia: 22 umol/L (ref 11–35)

## 2011-07-16 LAB — PROTEIN, BODY FLUID: Total protein, fluid: 5.3 g/dL

## 2011-07-16 LAB — LACTATE DEHYDROGENASE, PLEURAL OR PERITONEAL FLUID: LD, Fluid: 2869 U/L — ABNORMAL HIGH (ref 3–23)

## 2011-07-16 LAB — B-NATRIURETIC PEPTIDE (CONVERTED LAB): Pro B Natriuretic peptide (BNP): 190 pg/mL — ABNORMAL HIGH (ref 0.0–100.0)

## 2011-07-16 LAB — IRON AND TIBC
Saturation Ratios: 7 % — ABNORMAL LOW (ref 20–55)
TIBC: 170 ug/dL — ABNORMAL LOW (ref 215–435)
UIBC: 158 ug/dL

## 2011-07-16 LAB — POCT CARDIAC MARKERS: Myoglobin, poc: 344 ng/mL (ref 12–200)

## 2011-07-16 LAB — COMPREHENSIVE METABOLIC PANEL
ALT: 20 U/L (ref 0–53)
AST: 25 U/L (ref 0–37)
Albumin: 2.2 g/dL — ABNORMAL LOW (ref 3.5–5.2)
Calcium: 8.2 mg/dL — ABNORMAL LOW (ref 8.4–10.5)
Creatinine, Ser: 1.06 mg/dL (ref 0.4–1.5)
GFR calc Af Amer: >60 mL/min (ref >60)
GFR calc non Af Amer: >60 mL/min (ref >60)
Sodium: 136 mEq/L (ref 135–145)
Total Protein: 6.6 g/dL (ref 6.0–8.3)

## 2011-07-16 LAB — TRIGLYCERIDES, BODY FLUIDS: Triglycerides, Fluid: 60 mg/dL

## 2011-07-16 LAB — RETICULOCYTES: Retic Count, Absolute: 29.7 10*3/uL (ref 19.0–186.0)

## 2011-07-16 LAB — APTT: aPTT: 27 seconds (ref 24–37)

## 2011-07-16 LAB — PH, BODY FLUID

## 2011-07-16 LAB — CULTURE, ROUTINE-ABSCESS: Culture: NO GROWTH

## 2012-03-31 ENCOUNTER — Other Ambulatory Visit: Payer: Self-pay | Admitting: Gastroenterology

## 2012-04-09 ENCOUNTER — Ambulatory Visit (INDEPENDENT_AMBULATORY_CARE_PROVIDER_SITE_OTHER): Payer: Federal, State, Local not specified - PPO | Admitting: General Surgery

## 2012-04-09 ENCOUNTER — Encounter (INDEPENDENT_AMBULATORY_CARE_PROVIDER_SITE_OTHER): Payer: Self-pay | Admitting: General Surgery

## 2012-04-09 DIAGNOSIS — M129 Arthropathy, unspecified: Secondary | ICD-10-CM

## 2012-04-09 DIAGNOSIS — M1990 Unspecified osteoarthritis, unspecified site: Secondary | ICD-10-CM

## 2012-04-09 NOTE — Progress Notes (Signed)
Patient ID: [name] [name], male   DOB: [birth year], 56 y.o.   MRN: [id number]  Chief Complaint  Patient presents with  . Weight Loss Surgery    Bariatric Initial-Gastric Sleeve    HPI [name] [name] is a 56 y.o. male.   HPIThis patient is here for his initial weight loss surgery evaluation. He has a BMI of 60 with comorbidities of hypertension, sleep apnea, and back pain and spinal stenosis. He struggled his weight over the last 15 years. He has been active in sports prior to this until he began having back and hip problems and was eventually diagnosed with spinal stenosis which has limited his ability to exercise and he has gained weight since then. This has been mainly of the last 2 years. He is currently on a workout regimen with [facility] physical therapy and attends at least twice a week and tries to attend more often than this. However, he is limited to outside activities given his need for a walker. He denies any heartburn and reflux and recently underwent an EGD by Dr. Laural Benes in June for evaluation of anemia. This was noted to be essentially normal. He does use a CPAP machine for his obstructive sleep apnea.  Past Medical History  Diagnosis Date  . Anemia   . Arthritis   . Spinal stenosis     Past Surgical History  Procedure Date  . Back surgery 2011    No family history on file.  Social History History  Substance Use Topics  . Smoking status: Former Smoker    Types: Cigarettes    Quit date: 04/09/1974  . Smokeless tobacco: Not on file  . Alcohol Use: Yes    Allergies  Allergen Reactions  . Unisom (Doxylamine Succinate (Sleep)) Hives    Current Outpatient Prescriptions  Medication Sig Dispense Refill  . clobetasol ointment (TEMOVATE) 0.05 %       . doxepin (SINEQUAN) 10 MG capsule       . ferrous sulfate 325 (65 FE) MG tablet Take 325 mg by mouth daily with breakfast.      . Flaxseed, Linseed, (FLAX SEED OIL PO) Take by mouth.      Marland Kitchen oxaprozin (DAYPRO) 600 MG tablet  Take 600 mg by mouth daily.      Marland Kitchen oxyCODONE (OXY IR/ROXICODONE) 5 MG immediate release tablet       . polyethylene glycol-electrolytes (NULYTELY/GOLYTELY) 420 G solution       . pregabalin (LYRICA) 75 MG capsule Take 75 mg by mouth 2 (two) times daily.      Rolene Arbour BOWEL PREP SOLN         Review of Systems Review of Systems All other review of systems negative or noncontributory except as stated in the HPI  Blood pressure 152/94, pulse 76, temperature 98.4 F (36.9 C), temperature source Temporal, height 5\' 10"  (1.778 m), weight 419 lb 12.8 oz (190.42 kg), SpO2 96.00%.  Physical Exam Physical Exam Physical Exam  Vitals reviewed. Constitutional: He is oriented to person, place, and time. He appears well-developed and well-nourished. No distress.  HENT:  Head: Normocephalic and atraumatic.  Mouth/Throat: No oropharyngeal exudate.  Eyes: Conjunctivae and EOM are normal. Pupils are equal, round, and reactive to light. Right eye exhibits no discharge. Left eye exhibits no discharge. No scleral icterus.  Neck: Normal range of motion. No tracheal deviation present.  Cardiovascular: Normal rate, regular rhythm and normal heart sounds.   Pulmonary/Chest: Effort normal and breath sounds normal. No stridor. No respiratory distress.  He has no wheezes. He has no rales. He exhibits no tenderness.  Abdominal: Soft. Bowel sounds are normal. He exhibits no distension and no mass. There is no tenderness. There is no rebound and no guarding.  Musculoskeletal: Normal range of motion. He exhibits no edema and no tenderness.  Neurological: He is alert and oriented to person, place, and time. He does have a difficult time with gait but due to back pain. Skin: Skin is warm and dry. No rash noted. He is not diaphoretic. No erythema. No pallor.  Psychiatric: He has a normal mood and affect. His behavior is normal. Judgment and thought content normal.    Data Reviewed   Assessment    Morbid obesity with  BMI of 60 with comorbidities of hypertension, obstructive sleep apnea, and spinal stenosis and skin disorder. We discussed the LAP-BAND, sleeve gastrectomy, and Roux-en-Y gastric bypass. We discussed the pros and cons of each and the recent benefits and he is interested in proceeding with vertical sleeve gastrectomy. It sounds like you to be a fine candidate for this and we will proceed with workup. My concern for him is mainly his difficulty with mobility which may limit his results and limit his weight loss. He will really to adhere to the postoperative diet and control his calorie intake to be successful with this. Hopefully with some weight loss, this will improve his mobility. The risks of infection, bleeding, pain, scarring, weight regain, too little or too much weight loss, vitamin deficiencies and need for lifelong vitamin supplementation, hair loss, need for protein supplementation, leaks, stricture, reflux, food intolerance, need for reoperation and conversion to roux Y gastric bypass, need for open surgery, injury to spleen or surrounding structures, DVT's, PE, and death again discussed with the patient and the patient expressed understanding and desires to proceed with laparoscopic vertical sleeve gastrectomy, possible open, intraoperative endoscopy.     Plan    We will start him on the bariatric workup including nutrition labs, upper GI, psychology and nutrition evaluations.       Lodema Pilot DAVID 04/09/2012, 11:54 AM

## 2012-04-20 ENCOUNTER — Ambulatory Visit (HOSPITAL_COMMUNITY)
Admission: RE | Admit: 2012-04-20 | Discharge: 2012-04-20 | Disposition: A | Discharge status: Home / Self Care | Payer: Federal, State, Local not specified - PPO | Source: Ambulatory Visit | Attending: General Surgery | Admitting: General Surgery

## 2012-04-20 DIAGNOSIS — M1990 Unspecified osteoarthritis, unspecified site: Secondary | ICD-10-CM

## 2012-04-20 DIAGNOSIS — Z01818 Encounter for other preprocedural examination: Secondary | ICD-10-CM | POA: Insufficient documentation

## 2012-04-20 DIAGNOSIS — M129 Arthropathy, unspecified: Secondary | ICD-10-CM | POA: Insufficient documentation

## 2012-04-20 DIAGNOSIS — K219 Gastro-esophageal reflux disease without esophagitis: Secondary | ICD-10-CM | POA: Insufficient documentation

## 2012-05-01 ENCOUNTER — Encounter: Payer: Federal, State, Local not specified - PPO | Attending: General Surgery | Admitting: *Deleted

## 2012-05-01 ENCOUNTER — Encounter: Payer: Self-pay | Admitting: *Deleted

## 2012-05-01 DIAGNOSIS — Z713 Dietary counseling and surveillance: Secondary | ICD-10-CM | POA: Insufficient documentation

## 2012-05-01 DIAGNOSIS — Z01818 Encounter for other preprocedural examination: Secondary | ICD-10-CM | POA: Insufficient documentation

## 2012-05-01 LAB — CBC WITH DIFFERENTIAL/PLATELET
Basophils Absolute: 0 10*3/uL (ref 0.0–0.1)
Basophils Relative: 0 % (ref 0–1)
Eosinophils Absolute: 0.2 10*3/uL (ref 0.0–0.7)
Lymphs Abs: 1.7 10*3/uL (ref 0.7–4.0)
MCH: 24.4 pg — ABNORMAL LOW (ref 26.0–34.0)
MCHC: 32.4 g/dL (ref 30.0–36.0)
Neutrophils Relative %: 52 % (ref 43–77)
Platelets: 257 10*3/uL (ref 150–400)
RBC: 5 MIL/uL (ref 4.22–5.81)

## 2012-05-01 LAB — COMPREHENSIVE METABOLIC PANEL
ALT: 11 U/L (ref 0–53)
AST: 14 U/L (ref 0–37)
Alkaline Phosphatase: 96 U/L (ref 39–117)
Sodium: 140 mEq/L (ref 135–145)
Total Bilirubin: 0.3 mg/dL (ref 0.3–1.2)
Total Protein: 7.1 g/dL (ref 6.0–8.3)

## 2012-05-01 LAB — LIPID PANEL
HDL: 49 mg/dL (ref >39)
LDL Cholesterol: 94 mg/dL (ref 0–99)
Triglycerides: 50 mg/dL (ref <150)
VLDL: 10 mg/dL (ref 0–40)

## 2012-05-02 ENCOUNTER — Encounter: Payer: Self-pay | Admitting: *Deleted

## 2012-05-02 NOTE — Patient Instructions (Signed)
   Follow Pre-Op Nutrition Goals to prepare for Gastric Sleeve Surgery.   Call [facility] at [phone] once you have been given your surgery date to enrolled in the Pre-Op Nutrition Class. You will need to attend this nutrition class 3-4 weeks prior to your surgery.  

## 2012-05-02 NOTE — Progress Notes (Signed)
  Pre-Op Assessment Visit:  Pre-Operative Gastric Sleeve Surgery  Medical Nutrition Therapy:  Appt start time: 0930   End time:  1030.  Patient was seen on 05/01/12 for Pre-Operative Gastric Sleeve Nutrition Assessment. Assessment and letter of approval faxed to [facility] coordinator on 05/04/12.  Approval letter sent to [facility] and will be available in the chart under the media tab.  Handouts given during visit include:  Pre-Op Goals   Bariatric Surgery Protein Shakes  Patient to call for Pre-Op and Post-Op Nutrition Education at the [facility] when surgery is scheduled.  

## 2012-11-19 ENCOUNTER — Other Ambulatory Visit (INDEPENDENT_AMBULATORY_CARE_PROVIDER_SITE_OTHER): Payer: Self-pay | Admitting: General Surgery

## 2012-11-19 DIAGNOSIS — E669 Obesity, unspecified: Secondary | ICD-10-CM

## 2012-11-26 ENCOUNTER — Encounter (HOSPITAL_COMMUNITY): Payer: Self-pay | Admitting: Pharmacy Technician

## 2012-12-02 ENCOUNTER — Encounter (INDEPENDENT_AMBULATORY_CARE_PROVIDER_SITE_OTHER): Payer: Self-pay | Admitting: General Surgery

## 2012-12-02 ENCOUNTER — Encounter (HOSPITAL_COMMUNITY): Payer: Self-pay

## 2012-12-02 ENCOUNTER — Encounter (HOSPITAL_COMMUNITY)
Admission: RE | Admit: 2012-12-02 | Discharge: 2012-12-02 | Disposition: A | Discharge status: Home / Self Care | Payer: Federal, State, Local not specified - PPO | Source: Ambulatory Visit | Attending: General Surgery | Admitting: General Surgery

## 2012-12-02 ENCOUNTER — Ambulatory Visit (HOSPITAL_COMMUNITY)
Admission: RE | Admit: 2012-12-02 | Discharge: 2012-12-02 | Disposition: A | Discharge status: Home / Self Care | Payer: Federal, State, Local not specified - PPO | Source: Ambulatory Visit | Attending: General Surgery | Admitting: General Surgery

## 2012-12-02 ENCOUNTER — Ambulatory Visit (INDEPENDENT_AMBULATORY_CARE_PROVIDER_SITE_OTHER): Payer: Federal, State, Local not specified - PPO | Admitting: General Surgery

## 2012-12-02 VITALS — BP 156/92 | HR 66 | Temp 97.8°F | Resp 20 | Ht 70.0 in | Wt >= 6400 oz

## 2012-12-02 DIAGNOSIS — Z01818 Encounter for other preprocedural examination: Secondary | ICD-10-CM | POA: Insufficient documentation

## 2012-12-02 DIAGNOSIS — Z01812 Encounter for preprocedural laboratory examination: Secondary | ICD-10-CM | POA: Insufficient documentation

## 2012-12-02 DIAGNOSIS — E669 Obesity, unspecified: Secondary | ICD-10-CM

## 2012-12-02 DIAGNOSIS — J9819 Other pulmonary collapse: Secondary | ICD-10-CM | POA: Insufficient documentation

## 2012-12-02 HISTORY — DX: Other prurigo: L28.2

## 2012-12-02 HISTORY — DX: Essential (primary) hypertension: I10

## 2012-12-02 HISTORY — DX: Gastro-esophageal reflux disease without esophagitis: K21.9

## 2012-12-02 LAB — CBC WITH DIFFERENTIAL/PLATELET
Eosinophils Absolute: 0.2 10*3/uL (ref 0.0–0.7)
HCT: 38 % — ABNORMAL LOW (ref 39.0–52.0)
Hemoglobin: 12.3 g/dL — ABNORMAL LOW (ref 13.0–17.0)
Lymphs Abs: 1.5 10*3/uL (ref 0.7–4.0)
MCH: 25 pg — ABNORMAL LOW (ref 26.0–34.0)
MCV: 77.2 fL — ABNORMAL LOW (ref 78.0–100.0)
Monocytes Relative: 8 % (ref 3–12)
Neutrophils Relative %: 62 % (ref 43–77)
RBC: 4.92 MIL/uL (ref 4.22–5.81)

## 2012-12-02 LAB — COMPREHENSIVE METABOLIC PANEL
Alkaline Phosphatase: 82 U/L (ref 39–117)
BUN: 19 mg/dL (ref 6–23)
GFR calc Af Amer: >90 mL/min (ref >90)
Glucose, Bld: 87 mg/dL (ref 70–99)
Potassium: 3.8 mEq/L (ref 3.5–5.1)
Total Bilirubin: 0.4 mg/dL (ref 0.3–1.2)
Total Protein: 7.9 g/dL (ref 6.0–8.3)

## 2012-12-02 LAB — SURGICAL PCR SCREEN: MRSA, PCR: NEGATIVE

## 2012-12-02 NOTE — Progress Notes (Signed)
Patient ID: [name] [name], male   DOB: [birth year], 57 y.o.   MRN: [id number]  No chief complaint on file.   HPI [name] [name] is a 57 y.o. male.  This patient is here for his preop weight loss surgery visit.  He has BMI 61 with comorbidities of arthritis, OSA.  He is interested in the sleeve gastrectomy for obesity.  He has attended our info session and has had UGI and labs.  He has been cleared by nutrition and psychologist. He denies any reflux. He has lost 7 lbs on the preop diet. HPI  Past Medical History  Diagnosis Date  . Anemia   . Arthritis   . Spinal stenosis   . Obstructive sleep apnea   . Morbid obesity     Past Surgical History  Procedure Laterality Date  . Back surgery  01/23/2010    No family history on file.  Social History History  Substance Use Topics  . Smoking status: Former Smoker    Types: Cigarettes    Quit date: 04/09/1974  . Smokeless tobacco: Not on file  . Alcohol Use: Yes    Allergies  Allergen Reactions  . Unisom (Doxylamine Succinate (Sleep)) Hives    Current Outpatient Prescriptions  Medication Sig Dispense Refill  . calcium carbonate (TUMS - DOSED IN MG ELEMENTAL CALCIUM) 500 MG chewable tablet Chew 1 tablet by mouth daily as needed for heartburn.      . clobetasol ointment (TEMOVATE) 0.05 % Apply 1 application topically 2 (two) times daily as needed (apply after shower).       . ferrous sulfate 325 (65 FE) MG tablet Take 325 mg by mouth daily with breakfast.      . Multiple Vitamin (MULTIVITAMIN WITH MINERALS) TABS Take 1 tablet by mouth daily.      Marland Kitchen oxaprozin (DAYPRO) 600 MG tablet Take 600 mg by mouth daily as needed (for inflammation).       Marland Kitchen oxyCODONE (OXY IR/ROXICODONE) 5 MG immediate release tablet 5 mg as needed.       . pregabalin (LYRICA) 75 MG capsule Take 75 mg by mouth 2 (two) times daily as needed (for inflammation).        No current facility-administered medications for this visit.    Review of Systems Review of  Systems All other review of systems negative or noncontributory except as stated in the HPI  There were no vitals taken for this visit.  Physical Exam Physical Exam Physical Exam  Vitals reviewed. Constitutional: He is oriented to person, place, and time. He appears well-developed and well-nourished. No distress.  HENT:  Head: Normocephalic and atraumatic.  Mouth/Throat: No oropharyngeal exudate.  Eyes: Conjunctivae and EOM are normal. Pupils are equal, round, and reactive to light. Right eye exhibits no discharge. Left eye exhibits no discharge. No scleral icterus.  Neck: Normal range of motion. No tracheal deviation present.  Cardiovascular: Normal rate, regular rhythm and normal heart sounds.   Pulmonary/Chest: Effort normal and breath sounds normal. No stridor. No respiratory distress. He has no wheezes. He has no rales. He exhibits no tenderness.  Abdominal: Soft. Bowel sounds are normal. He exhibits no distension and no mass. There is no tenderness. There is no rebound and no guarding.  Musculoskeletal: Normal range of motion. He exhibits no edema and no tenderness.  Neurological: He is alert and oriented to person, place, and time.  Skin: Skin is warm and dry. No rash noted. He is not diaphoretic. No erythema. No pallor.  Psychiatric: He  has a normal mood and affect. His behavior is normal. Judgment and thought content normal.    Data Reviewed   Assessment    Morbid obesity with BMI 60 and HTN, arthritis, and OSA He remains interested in the vertical sleeve gastrectomy. He is scheduled for surgery next week. He is on the preoperative diet and has lost about 7 pounds. I reviewed with him the perioperative course and we again discussed the surgical options including the risks and benefits of the procedure. The risks of infection, bleeding, pain, scarring, weight regain, too little or too much weight loss, vitamin deficiencies and need for lifelong vitamin supplementation, hair  loss, need for protein supplementation, leaks, stricture, reflux, food intolerance, need for reoperation and conversion to roux Y gastric bypass, need for open surgery, injury to spleen or surrounding structures, DVT's, PE, and death again discussed with the patient and the patient expressed understanding and desires to proceed with laparoscopic vertical sleeve gastrectomy, possible open, intraoperative endoscopy.     Plan    We will proceed with vertical sleeve gastrectomy next week        Osmani Kersten DAVID 12/02/2012, 8:34 AM

## 2012-12-02 NOTE — Progress Notes (Signed)
ekg 7/13  EPIC

## 2012-12-02 NOTE — Patient Instructions (Addendum)
[name] [name]  12/02/2012   Your procedure is scheduled on:  12/07/12  monday  Report to [facility] at 0745      AM.  Call this number if you have problems the morning of surgery: [phone]     Bring cpap mask and tubing with you to hospital  Remember:   Do not eat food  Or drink :After Midnight.sunday night   Take these medicines the morning of surgery with A SIP OF WATER:  Oxy ir if needed   .  Contacts, dentures or partial plates can not be worn to surgery  Leave suitcase in the car. After surgery it may be brought to your room.  For patients admitted to the hospital, checkout time is 11:00 AM day of  discharge.             SPECIAL INSTRUCTIONS- SEE [place] PREPARING FOR SURGERY INSTRUCTION SHEET-     DO NOT WEAR JEWELRY, LOTIONS, POWDERS, OR PERFUMES.  WOMEN-- DO NOT SHAVE LEGS OR UNDERARMS FOR 12 HOURS BEFORE SHOWERS. MEN MAY SHAVE FACE.  Patients discharged the day of surgery will not be allowed to drive home. IF going home the day of surgery, you must have a driver and someone to stay with you for the first 24 hours  Name and phone number of your driver:      WIFE  [name]                                                                 Please read over the following fact sheets that you were given: MRSA Information, Incentive Spirometry Sheet, Blood Transfusion Sheet  Information                                                                                   [name] [name]  PST 336  [id number]                 FAILURE TO FOLLOW THESE INSTRUCTIONS MAY RESULT IN  CANCELLATION   OF YOUR SURGERY                                                  Patient Signature _____________________________

## 2012-12-02 NOTE — Progress Notes (Signed)
Pt states has had prurigo on his skin x 10 years- states is not infectious.  Applies topical med prn.  Spoke with JENNIFER in [facility] regarding need for bariatric bed and trapeze post op

## 2012-12-03 ENCOUNTER — Encounter: Payer: Federal, State, Local not specified - PPO | Attending: General Surgery | Admitting: *Deleted

## 2012-12-03 DIAGNOSIS — Z01818 Encounter for other preprocedural examination: Secondary | ICD-10-CM | POA: Insufficient documentation

## 2012-12-03 DIAGNOSIS — Z713 Dietary counseling and surveillance: Secondary | ICD-10-CM | POA: Insufficient documentation

## 2012-12-05 NOTE — Progress Notes (Signed)
Bariatric Class:  Appt start time: 0930 end time:  1030.  Pre-Operative Nutrition Class  Patient was seen on 12/03/12 for Pre-Operative Bariatric Surgery Education at [facility].   Surgery date: 12/07/12 Surgery type: Sleeve Start weight at [facility]: 422.5 lbs (05/01/12)  Weight today: 425.6 lbs Weight change: n/a Total weight lost: n/a BMI: 61.1  Samples given per MNT protocol: Bariatric Advantage Multivitamin Lot # [id number] Exp: 06/15  Bariatric Advantage Calcium Citrate Lot # [id number] Exp: 10/15  Bariatric Advantage Sublingual B12 Lot # [id number] Exp: 10/15  Celebrate Vitamins Complete Multivitamin Lot # [id number] Exp: 11/14  Celebrate Vitamins Calcium Citrate Lot # [id number] Exp: 08/15  Corliss Marcus Protein Powder Lot # [id number] Exp: 06/15  Premier Protein Shake Lot # [id number] Exp: 10/26/13  The following the learning objective met by the patient during this course:  Identifies Pre-Op Dietary Goals and will begin 2 weeks pre-operatively  Identifies appropriate sources of fluids and proteins   States protein recommendations and appropriate sources pre and post-operatively  Identifies Post-Operative Dietary Goals and will follow for 2 weeks post-operatively  Identifies appropriate multivitamin and calcium sources  Describes the need for physical activity post-operatively and will follow MD recommendations  States when to call healthcare provider regarding medication questions or post-operative complications  Handouts given during class include:  Pre-Op Bariatric Surgery Diet Handout  Protein Shake Handout  Post-Op Bariatric Surgery Nutrition Handout  [facility] Program Information Flyer  Support Group Information Flyer  [facility] Bariatric Supplements Price List  Follow-Up Plan: Patient will follow-up at [facility] 2 weeks post operatively for diet advancement per MD.

## 2012-12-07 ENCOUNTER — Ambulatory Visit (HOSPITAL_COMMUNITY): Payer: Federal, State, Local not specified - PPO | Admitting: Anesthesiology

## 2012-12-07 ENCOUNTER — Encounter (HOSPITAL_COMMUNITY): Payer: Self-pay | Admitting: Anesthesiology

## 2012-12-07 ENCOUNTER — Inpatient Hospital Stay (HOSPITAL_COMMUNITY)
Admission: RE | Admit: 2012-12-07 | Discharge: 2012-12-09 | DRG: 293 | Disposition: A | Discharge status: Home / Self Care | Payer: Federal, State, Local not specified - PPO | Source: Ambulatory Visit | Attending: General Surgery | Admitting: General Surgery

## 2012-12-07 ENCOUNTER — Encounter (HOSPITAL_COMMUNITY): Payer: Self-pay | Admitting: *Deleted

## 2012-12-07 ENCOUNTER — Encounter: Payer: Self-pay | Admitting: *Deleted

## 2012-12-07 ENCOUNTER — Encounter (HOSPITAL_COMMUNITY)
Admission: RE | Disposition: A | Discharge status: Home / Self Care | Payer: Self-pay | Source: Ambulatory Visit | Attending: General Surgery

## 2012-12-07 DIAGNOSIS — G4733 Obstructive sleep apnea (adult) (pediatric): Secondary | ICD-10-CM | POA: Diagnosis present

## 2012-12-07 DIAGNOSIS — M129 Arthropathy, unspecified: Secondary | ICD-10-CM

## 2012-12-07 DIAGNOSIS — Z6841 Body Mass Index (BMI) 40.0 and over, adult: Secondary | ICD-10-CM

## 2012-12-07 DIAGNOSIS — Z87891 Personal history of nicotine dependence: Secondary | ICD-10-CM

## 2012-12-07 DIAGNOSIS — K219 Gastro-esophageal reflux disease without esophagitis: Secondary | ICD-10-CM | POA: Diagnosis present

## 2012-12-07 DIAGNOSIS — Z79899 Other long term (current) drug therapy: Secondary | ICD-10-CM

## 2012-12-07 DIAGNOSIS — E669 Obesity, unspecified: Secondary | ICD-10-CM

## 2012-12-07 DIAGNOSIS — I10 Essential (primary) hypertension: Secondary | ICD-10-CM | POA: Diagnosis present

## 2012-12-07 HISTORY — PX: ESOPHAGOGASTRODUODENOSCOPY: SHX5428

## 2012-12-07 HISTORY — PX: LAPAROSCOPIC GASTRIC SLEEVE RESECTION: SHX5895

## 2012-12-07 SURGERY — GASTRECTOMY, SLEEVE, LAPAROSCOPIC
Anesthesia: General | Site: Esophagus | Wound class: Clean Contaminated

## 2012-12-07 MED ORDER — LIDOCAINE HCL (CARDIAC) 20 MG/ML IV SOLN
INTRAVENOUS | Status: DC | PRN
  Administered 2012-12-07: 30 mg via INTRAVENOUS

## 2012-12-07 MED ORDER — SUCCINYLCHOLINE CHLORIDE 20 MG/ML IJ SOLN
INTRAMUSCULAR | Status: DC | PRN
  Administered 2012-12-07: 200 mg via INTRAVENOUS

## 2012-12-07 MED ORDER — FENTANYL CITRATE 0.05 MG/ML IJ SOLN
INTRAMUSCULAR | Status: DC | PRN
  Administered 2012-12-07: 25 ug via INTRAVENOUS
  Administered 2012-12-07 (×2): 50 ug via INTRAVENOUS
  Administered 2012-12-07: 100 ug via INTRAVENOUS
  Administered 2012-12-07: 25 ug via INTRAVENOUS

## 2012-12-07 MED ORDER — CLOBETASOL PROPIONATE 0.05 % EX OINT
1.0000 "application " | TOPICAL_OINTMENT | Freq: Two times a day (BID) | CUTANEOUS | Status: DC | PRN
  Filled 2012-12-07: qty 15

## 2012-12-07 MED ORDER — MORPHINE SULFATE 4 MG/ML IJ SOLN
INTRAMUSCULAR | Status: AC
  Administered 2012-12-07: 4 mg
  Filled 2012-12-07: qty 1

## 2012-12-07 MED ORDER — LIDOCAINE-EPINEPHRINE 1 %-1:100000 IJ SOLN
INTRAMUSCULAR | Status: DC | PRN
  Administered 2012-12-07: 22 mL

## 2012-12-07 MED ORDER — BUPIVACAINE HCL (PF) 0.25 % IJ SOLN
INTRAMUSCULAR | Status: AC
  Filled 2012-12-07: qty 30

## 2012-12-07 MED ORDER — TISSEEL VH 10 ML EX KIT
PACK | CUTANEOUS | Status: AC
  Filled 2012-12-07: qty 1

## 2012-12-07 MED ORDER — ACETAMINOPHEN 160 MG/5ML PO SOLN
650.0000 mg | ORAL | Status: DC | PRN
  Filled 2012-12-07: qty 20.3

## 2012-12-07 MED ORDER — TISSEEL VH 10 ML EX KIT
PACK | CUTANEOUS | Status: DC | PRN
  Administered 2012-12-07: 10 mL

## 2012-12-07 MED ORDER — PROPOFOL 10 MG/ML IV EMUL
INTRAVENOUS | Status: DC | PRN
  Administered 2012-12-07: 250 mg via INTRAVENOUS

## 2012-12-07 MED ORDER — HYDROMORPHONE HCL PF 1 MG/ML IJ SOLN
INTRAMUSCULAR | Status: AC
  Filled 2012-12-07: qty 1

## 2012-12-07 MED ORDER — POTASSIUM CHLORIDE IN NACL 20-0.9 MEQ/L-% IV SOLN
INTRAVENOUS | Status: DC
  Administered 2012-12-07 – 2012-12-09 (×5): via INTRAVENOUS
  Filled 2012-12-07 (×7): qty 1000

## 2012-12-07 MED ORDER — ONDANSETRON HCL 4 MG/2ML IJ SOLN
INTRAMUSCULAR | Status: DC | PRN
  Administered 2012-12-07 (×2): 2 mg via INTRAVENOUS

## 2012-12-07 MED ORDER — LACTATED RINGERS IV SOLN
INTRAVENOUS | Status: DC | PRN
  Administered 2012-12-07 (×2): via INTRAVENOUS

## 2012-12-07 MED ORDER — OXYCODONE-ACETAMINOPHEN 5-325 MG/5ML PO SOLN: 5.0000 mL | ORAL | Status: DC | PRN

## 2012-12-07 MED ORDER — KETAMINE HCL 10 MG/ML IJ SOLN
INTRAMUSCULAR | Status: DC | PRN
  Administered 2012-12-07 (×2): 5 mg via INTRAVENOUS

## 2012-12-07 MED ORDER — UNJURY CHICKEN SOUP POWDER: 2.0000 [oz_av] | Freq: Four times a day (QID) | ORAL | Status: DC

## 2012-12-07 MED ORDER — PROMETHAZINE HCL 25 MG/ML IJ SOLN: 6.2500 mg | INTRAMUSCULAR | Status: DC | PRN

## 2012-12-07 MED ORDER — BUPIVACAINE HCL 0.25 % IJ SOLN
INTRAMUSCULAR | Status: DC | PRN
  Administered 2012-12-07: 22 mL

## 2012-12-07 MED ORDER — SODIUM CHLORIDE 0.9 % IV SOLN
INTRAVENOUS | Status: AC
  Filled 2012-12-07: qty 1

## 2012-12-07 MED ORDER — MIDAZOLAM HCL 5 MG/5ML IJ SOLN
INTRAMUSCULAR | Status: DC | PRN
  Administered 2012-12-07: 1 mg via INTRAVENOUS

## 2012-12-07 MED ORDER — ENOXAPARIN SODIUM 40 MG/0.4ML ~~LOC~~ SOLN
40.0000 mg | Freq: Two times a day (BID) | SUBCUTANEOUS | Status: DC
  Administered 2012-12-08 – 2012-12-09 (×3): 40 mg via SUBCUTANEOUS
  Filled 2012-12-07 (×5): qty 0.4

## 2012-12-07 MED ORDER — SODIUM CHLORIDE 0.9 % IV SOLN
1.0000 g | INTRAVENOUS | Status: AC
  Administered 2012-12-07: 1 g via INTRAVENOUS

## 2012-12-07 MED ORDER — HEPARIN SODIUM (PORCINE) 5000 UNIT/ML IJ SOLN
5000.0000 [IU] | Freq: Once | INTRAMUSCULAR | Status: AC
  Administered 2012-12-07: 5000 [IU] via SUBCUTANEOUS
  Filled 2012-12-07: qty 1

## 2012-12-07 MED ORDER — PNEUMOCOCCAL VAC POLYVALENT 25 MCG/0.5ML IJ INJ
0.5000 mL | INJECTION | INTRAMUSCULAR | Status: AC
  Administered 2012-12-08: 0.5 mL via INTRAMUSCULAR
  Filled 2012-12-07 (×2): qty 0.5

## 2012-12-07 MED ORDER — HYDROMORPHONE HCL PF 1 MG/ML IJ SOLN
0.2500 mg | INTRAMUSCULAR | Status: DC | PRN
  Administered 2012-12-07 (×4): 0.5 mg via INTRAVENOUS

## 2012-12-07 MED ORDER — MORPHINE SULFATE 10 MG/ML IJ SOLN
2.0000 mg | INTRAMUSCULAR | Status: DC | PRN
Start: 2012-12-07 — End: 2012-12-09
  Administered 2012-12-08 (×2): 4 mg via INTRAVENOUS
  Filled 2012-12-07 (×2): qty 1

## 2012-12-07 MED ORDER — UNJURY VANILLA POWDER: 2.0000 [oz_av] | Freq: Four times a day (QID) | ORAL | Status: DC

## 2012-12-07 MED ORDER — LACTATED RINGERS IR SOLN
Status: DC | PRN
  Administered 2012-12-07: 1000 mL

## 2012-12-07 MED ORDER — CISATRACURIUM BESYLATE (PF) 10 MG/5ML IV SOLN
INTRAVENOUS | Status: DC | PRN
  Administered 2012-12-07: 12 mg via INTRAVENOUS

## 2012-12-07 MED ORDER — LACTATED RINGERS IV SOLN
INTRAVENOUS | Status: DC
  Administered 2012-12-07: 1000 mL via INTRAVENOUS

## 2012-12-07 MED ORDER — ACETAMINOPHEN 10 MG/ML IV SOLN
INTRAVENOUS | Status: AC
Start: 2012-12-07 — End: 2012-12-07
  Filled 2012-12-07: qty 100

## 2012-12-07 MED ORDER — ONDANSETRON HCL 4 MG/2ML IJ SOLN: 4.0000 mg | INTRAMUSCULAR | Status: DC | PRN

## 2012-12-07 MED ORDER — ACETAMINOPHEN 10 MG/ML IV SOLN
INTRAVENOUS | Status: DC | PRN
  Administered 2012-12-07: 1000 mg via INTRAVENOUS

## 2012-12-07 MED ORDER — DEXAMETHASONE SODIUM PHOSPHATE 4 MG/ML IJ SOLN
INTRAMUSCULAR | Status: DC | PRN
  Administered 2012-12-07 (×2): 5 mg via INTRAVENOUS

## 2012-12-07 MED ORDER — UNJURY CHOCOLATE CLASSIC POWDER
2.0000 [oz_av] | Freq: Four times a day (QID) | ORAL | Status: DC
  Administered 2012-12-09: 2 [oz_av] via ORAL

## 2012-12-07 MED ORDER — LIDOCAINE-EPINEPHRINE 1 %-1:100000 IJ SOLN
INTRAMUSCULAR | Status: AC
  Filled 2012-12-07: qty 1

## 2012-12-07 SURGICAL SUPPLY — 50 items
APPLICATOR COTTON TIP 6IN STRL (MISCELLANEOUS) ×6 IMPLANT
APPLIER CLIP ROT 10 11.4 M/L (STAPLE)
CABLE HIGH FREQUENCY MONO STRZ (ELECTRODE) ×3 IMPLANT
CANISTER SUCTION 2500CC (MISCELLANEOUS) ×3 IMPLANT
CHLORAPREP W/TINT 26ML (MISCELLANEOUS) ×6 IMPLANT
CLIP APPLIE ROT 10 11.4 M/L (STAPLE) IMPLANT
CLOTH BEACON ORANGE TIMEOUT ST (SAFETY) ×3 IMPLANT
DERMABOND ADVANCED (GAUZE/BANDAGES/DRESSINGS) ×1
DERMABOND ADVANCED .7 DNX12 (GAUZE/BANDAGES/DRESSINGS) ×2 IMPLANT
DEVICE SUTURE ENDOST 10MM (ENDOMECHANICALS) IMPLANT
DRAIN CHANNEL 19F RND (DRAIN) ×3 IMPLANT
DRAPE INCISE IOBAN 66X45 STRL (DRAPES) ×3 IMPLANT
DRAPE LAPAROSCOPIC ABDOMINAL (DRAPES) ×3 IMPLANT
DRAPE UTILITY 15X26 (DRAPE) ×6 IMPLANT
ELECT REM PT RETURN 9FT ADLT (ELECTROSURGICAL) ×3
ELECTRODE REM PT RTRN 9FT ADLT (ELECTROSURGICAL) ×2 IMPLANT
EVACUATOR DRAINAGE 10X20 100CC (DRAIN) ×2 IMPLANT
EVACUATOR SILICONE 100CC (DRAIN) ×4 IMPLANT
GLOVE SURG SS PI 7.5 STRL IVOR (GLOVE) ×6 IMPLANT
GOWN STRL NON-REIN LRG LVL3 (GOWN DISPOSABLE) ×3 IMPLANT
GOWN STRL REIN XL XLG (GOWN DISPOSABLE) ×15 IMPLANT
HANDLE STAPLE EGIA 4 XL (STAPLE) ×3 IMPLANT
HOVERMATT SINGLE USE (MISCELLANEOUS) ×3 IMPLANT
KIT BASIN OR (CUSTOM PROCEDURE TRAY) ×3 IMPLANT
MARKER SKIN DUAL TIP RULER LAB (MISCELLANEOUS) ×3 IMPLANT
NEEDLE SPNL 22GX3.5 QUINCKE BK (NEEDLE) ×3 IMPLANT
NS IRRIG 1000ML POUR BTL (IV SOLUTION) ×3 IMPLANT
PENCIL BUTTON HOLSTER BLD 10FT (ELECTRODE) ×3 IMPLANT
POUCH SPECIMEN RETRIEVAL 10MM (ENDOMECHANICALS) IMPLANT
RELOAD EGIA 60 MED/THCK PURPLE (STAPLE) ×9 IMPLANT
RELOAD TRI 2.0 60 XTHK VAS SUL (STAPLE) ×6 IMPLANT
SCISSORS LAP 5X35 DISP (ENDOMECHANICALS) IMPLANT
SEALANT SURGICAL APPL DUAL CAN (MISCELLANEOUS) ×3 IMPLANT
SET IRRIG TUBING LAPAROSCOPIC (IRRIGATION / IRRIGATOR) ×3 IMPLANT
SHEARS CURVED HARMONIC AC 45CM (MISCELLANEOUS) ×3 IMPLANT
SLEEVE ENDOPATH XCEL 5M (ENDOMECHANICALS) ×9 IMPLANT
SOLUTION ANTI FOG 6CC (MISCELLANEOUS) ×3 IMPLANT
SPONGE GAUZE 4X4 12PLY (GAUZE/BANDAGES/DRESSINGS) IMPLANT
SPONGE LAP 18X18 X RAY DECT (DISPOSABLE) IMPLANT
SUT ETHILON 2 0 PS N (SUTURE) ×3 IMPLANT
SUT MNCRL AB 4-0 PS2 18 (SUTURE) ×3 IMPLANT
SUT VICRYL 0 UR6 27IN ABS (SUTURE) ×3 IMPLANT
SYR 50ML LL SCALE MARK (SYRINGE) ×3 IMPLANT
TRAY FOLEY CATH 14FRSI W/METER (CATHETERS) ×3 IMPLANT
TRAY LAP CHOLE (CUSTOM PROCEDURE TRAY) ×3 IMPLANT
TROCAR BLADELESS 15MM (ENDOMECHANICALS) ×3 IMPLANT
TROCAR BLADELESS OPT 5 100 (ENDOMECHANICALS) ×6 IMPLANT
TUBING CONNECTING 10 (TUBING) ×3 IMPLANT
TUBING ENDO SMARTCAP (MISCELLANEOUS) ×3 IMPLANT
TUBING FILTER THERMOFLATOR (ELECTROSURGICAL) ×3 IMPLANT

## 2012-12-07 NOTE — Anesthesia Postprocedure Evaluation (Signed)
  Anesthesia Post-op Note  Patient: [name] [name]  Procedure(s) Performed: Procedure(s) (LRB): LAPAROSCOPIC GASTRIC SLEEVE RESECTION (N/A) ESOPHAGOGASTRODUODENOSCOPY (EGD) (N/A)  Patient Location: PACU  Anesthesia Type: General  Level of Consciousness: awake and alert   Airway and Oxygen Therapy: Patient Spontanous Breathing  Post-op Pain: mild  Post-op Assessment: Post-op Vital signs reviewed, Patient's Cardiovascular Status Stable, Respiratory Function Stable, Patent Airway and No signs of Nausea or vomiting  Last Vitals:  Filed Vitals:   12/07/12 1330  BP: 142/78  Pulse: 60  Temp:   Resp: 13    Post-op Vital Signs: stable   Complications: No apparent anesthesia complications. To floor with pulse oximetry, CO2 monitoring, oxygen.

## 2012-12-07 NOTE — Interval H&P Note (Signed)
History and Physical Interval Note:  12/07/2012 10:12 AM  Scot Jun  has presented today for surgery, with the diagnosis of morbid obesity  The various methods of treatment have been discussed with the patient and family. After consideration of risks, benefits and other options for treatment, the patient has consented to  Procedure(s) with comments: LAPAROSCOPIC GASTRIC SLEEVE RESECTION (N/A) - laparoscopic sleeve gastrectomy with EGD ESOPHAGOGASTRODUODENOSCOPY (EGD) (N/A) as a surgical intervention .  The patient's history has been reviewed, patient examined, no change in status, stable for surgery.  I have reviewed the patient's chart and labs.  Questions were answered to the patient's satisfaction.  He was seen and evaluated in the preop area.  Risks of the procedure again discussed in lay terms.  The risks of infection, bleeding, pain, scarring, weight regain, too little or too much weight loss, vitamin deficiencies and need for lifelong vitamin supplementation, hair loss, need for protein supplementation, leaks, stricture, reflux, food intolerance, need for reoperation and conversion to roux Y gastric bypass, need for open surgery, injury to spleen or surrounding structures, DVT's, PE, and death again discussed with the patient and the patient expressed understanding and desires to proceed with laparoscopic vertical sleeve gastrectomy, possible open, intraoperative endoscopy.    Lodema Pilot DAVID

## 2012-12-07 NOTE — Patient Instructions (Signed)
Follow:   Pre-Op Diet per MD 2 weeks prior to surgery  Phase 2- Liquids (clear/full) 2 weeks after surgery  Vitamin/Mineral/Calcium guidelines for purchasing bariatric supplements  Exercise guidelines pre and post-op per MD  Follow-up at [facility] in 2 weeks post-op for diet advancement. Contact Doneen Ollinger as needed with questions/concerns. 

## 2012-12-07 NOTE — H&P (View-Only) (Signed)
Patient ID: [name] [name], male   DOB: 12/21/1955, 57 y.o.   MRN: [id number]  No chief complaint on file.   HPI [name] [name] is a 57 y.o. male.  This patient is here for his preop weight loss surgery visit.  He has BMI 61 with comorbidities of arthritis, OSA.  He is interested in the sleeve gastrectomy for obesity.  He has attended our info session and has had UGI and labs.  He has been cleared by nutrition and psychologist. He denies any reflux. He has lost 7 lbs on the preop diet. HPI  Past Medical History  Diagnosis Date  . Anemia   . Arthritis   . Spinal stenosis   . Obstructive sleep apnea   . Morbid obesity     Past Surgical History  Procedure Laterality Date  . Back surgery  01/23/2010    No family history on file.  Social History History  Substance Use Topics  . Smoking status: Former Smoker    Types: Cigarettes    Quit date: 04/09/1974  . Smokeless tobacco: Not on file  . Alcohol Use: Yes    Allergies  Allergen Reactions  . Unisom (Doxylamine Succinate (Sleep)) Hives    Current Outpatient Prescriptions  Medication Sig Dispense Refill  . calcium carbonate (TUMS - DOSED IN MG ELEMENTAL CALCIUM) 500 MG chewable tablet Chew 1 tablet by mouth daily as needed for heartburn.      . clobetasol ointment (TEMOVATE) 0.05 % Apply 1 application topically 2 (two) times daily as needed (apply after shower).       . ferrous sulfate 325 (65 FE) MG tablet Take 325 mg by mouth daily with breakfast.      . Multiple Vitamin (MULTIVITAMIN WITH MINERALS) TABS Take 1 tablet by mouth daily.      . oxaprozin (DAYPRO) 600 MG tablet Take 600 mg by mouth daily as needed (for inflammation).       . oxyCODONE (OXY IR/ROXICODONE) 5 MG immediate release tablet 5 mg as needed.       . pregabalin (LYRICA) 75 MG capsule Take 75 mg by mouth 2 (two) times daily as needed (for inflammation).        No current facility-administered medications for this visit.    Review of Systems Review of  Systems All other review of systems negative or noncontributory except as stated in the HPI  There were no vitals taken for this visit.  Physical Exam Physical Exam Physical Exam  Vitals reviewed. Constitutional: He is oriented to person, place, and time. He appears well-developed and well-nourished. No distress.  HENT:  Head: Normocephalic and atraumatic.  Mouth/Throat: No oropharyngeal exudate.  Eyes: Conjunctivae and EOM are normal. Pupils are equal, round, and reactive to light. Right eye exhibits no discharge. Left eye exhibits no discharge. No scleral icterus.  Neck: Normal range of motion. No tracheal deviation present.  Cardiovascular: Normal rate, regular rhythm and normal heart sounds.   Pulmonary/Chest: Effort normal and breath sounds normal. No stridor. No respiratory distress. He has no wheezes. He has no rales. He exhibits no tenderness.  Abdominal: Soft. Bowel sounds are normal. He exhibits no distension and no mass. There is no tenderness. There is no rebound and no guarding.  Musculoskeletal: Normal range of motion. He exhibits no edema and no tenderness.  Neurological: He is alert and oriented to person, place, and time.  Skin: Skin is warm and dry. No rash noted. He is not diaphoretic. No erythema. No pallor.  Psychiatric: He   has a normal mood and affect. His behavior is normal. Judgment and thought content normal.    Data Reviewed   Assessment    Morbid obesity with BMI 60 and HTN, arthritis, and OSA He remains interested in the vertical sleeve gastrectomy. He is scheduled for surgery next week. He is on the preoperative diet and has lost about 7 pounds. I reviewed with him the perioperative course and we again discussed the surgical options including the risks and benefits of the procedure. The risks of infection, bleeding, pain, scarring, weight regain, too little or too much weight loss, vitamin deficiencies and need for lifelong vitamin supplementation, hair  loss, need for protein supplementation, leaks, stricture, reflux, food intolerance, need for reoperation and conversion to roux Y gastric bypass, need for open surgery, injury to spleen or surrounding structures, DVT's, PE, and death again discussed with the patient and the patient expressed understanding and desires to proceed with laparoscopic vertical sleeve gastrectomy, possible open, intraoperative endoscopy.     Plan    We will proceed with vertical sleeve gastrectomy next week        Travor Royce DAVID 12/02/2012, 8:34 AM    

## 2012-12-07 NOTE — Transfer of Care (Signed)
Immediate Anesthesia Transfer of Care Note  Patient: [name] [name]  Procedure(s) Performed: Procedure(s) with comments: LAPAROSCOPIC GASTRIC SLEEVE RESECTION (N/A) - laparoscopic sleeve gastrectomy with EGD ESOPHAGOGASTRODUODENOSCOPY (EGD) (N/A)  Patient Location: PACU  Anesthesia Type:General  Level of Consciousness: awake, alert , oriented and patient cooperative  Airway & Oxygen Therapy: Patient Spontanous Breathing and Patient connected to face mask oxygen  Post-op Assessment: Report given to PACU RN, Post -op Vital signs reviewed and stable and Patient moving all extremities  Post vital signs: Reviewed and stable  Complications: No apparent anesthesia complications

## 2012-12-07 NOTE — Anesthesia Preprocedure Evaluation (Signed)
Anesthesia Evaluation  Patient identified by MRN, date of birth, ID band Patient awake    Reviewed: Allergy & Precautions, H&P , NPO status , Patient's Chart, lab work & pertinent test results  Airway Mallampati: II TM Distance: >3 FB Neck ROM: Full    Dental no notable dental hx.    Pulmonary sleep apnea ,  breath sounds clear to auscultation  Pulmonary exam normal       Cardiovascular hypertension, Rhythm:Regular Rate:Normal     Neuro/Psych negative neurological ROS  negative psych ROS   GI/Hepatic Neg liver ROS, GERD-  ,  Endo/Other  Morbid obesity  Renal/GU negative Renal ROS  negative genitourinary   Musculoskeletal negative musculoskeletal ROS (+)   Abdominal (+) + obese,   Peds negative pediatric ROS (+)  Hematology negative hematology ROS (+)   Anesthesia Other Findings   Reproductive/Obstetrics negative OB ROS                           Anesthesia Physical Anesthesia Plan  ASA: III  Anesthesia Plan: General   Post-op Pain Management:    Induction: Intravenous  Airway Management Planned: Oral ETT  Additional Equipment:   Intra-op Plan:   Post-operative Plan: Extubation in OR  Informed Consent: I have reviewed the patients History and Physical, chart, labs and discussed the procedure including the risks, benefits and alternatives for the proposed anesthesia with the patient or authorized representative who has indicated his/her understanding and acceptance.   Dental advisory given  Plan Discussed with: CRNA  Anesthesia Plan Comments:         Anesthesia Quick Evaluation

## 2012-12-07 NOTE — Brief Op Note (Signed)
12/07/2012  12:57 PM  PATIENT:  [name] [name]  57 y.o. male  PRE-OPERATIVE DIAGNOSIS:  morbid obesity  POST-OPERATIVE DIAGNOSIS:  morbid obesity  PROCEDURE:  Procedure(s) with comments: LAPAROSCOPIC GASTRIC SLEEVE RESECTION (N/A) - laparoscopic sleeve gastrectomy with EGD ESOPHAGOGASTRODUODENOSCOPY (EGD) (N/A)  SURGEON:  Surgeon(s) and Role:    * Lodema Pilot, DO - Primary    * Mariella Saa, MD - Assisting  PHYSICIAN ASSISTANT:   ASSISTANTS: [place]   ANESTHESIA:   general  EBL:  Total I/O In: 300 [I.V.:300] Out: 210 [Urine:150; Other:10; Blood:50]  BLOOD ADMINISTERED:none  DRAINS: (22F ) Jackson-Pratt drain(s) with closed bulb suction in the sleeve staple line   LOCAL MEDICATIONS USED:  MARCAINE    and LIDOCAINE   SPECIMEN:  Source of Specimen:  sleeve gastrectomy  DISPOSITION OF SPECIMEN:  PATHOLOGY  COUNTS:  YES  TOURNIQUET:  * No tourniquets in log *  DICTATION: .Other Dictation: Dictation Number dictated  PLAN OF CARE: Admit to inpatient   PATIENT DISPOSITION:  PACU - hemodynamically stable.   Delay start of Pharmacological VTE agent (>24hrs) due to surgical blood loss or risk of bleeding: no

## 2012-12-08 ENCOUNTER — Encounter (HOSPITAL_COMMUNITY): Payer: Self-pay | Admitting: General Surgery

## 2012-12-08 ENCOUNTER — Inpatient Hospital Stay (HOSPITAL_COMMUNITY): Payer: Federal, State, Local not specified - PPO

## 2012-12-08 LAB — CBC WITH DIFFERENTIAL/PLATELET
Eosinophils Absolute: 0 10*3/uL (ref 0.0–0.7)
Lymphs Abs: 0.8 10*3/uL (ref 0.7–4.0)
MCH: 24.8 pg — ABNORMAL LOW (ref 26.0–34.0)
Neutrophils Relative %: 78 % — ABNORMAL HIGH (ref 43–77)
Platelets: 244 10*3/uL (ref 150–400)
RBC: 4.76 MIL/uL (ref 4.22–5.81)
WBC: 6.2 10*3/uL (ref 4.0–10.5)

## 2012-12-08 LAB — COMPREHENSIVE METABOLIC PANEL
ALT: 26 U/L (ref 0–53)
Albumin: 3.6 g/dL (ref 3.5–5.2)
Alkaline Phosphatase: 82 U/L (ref 39–117)
GFR calc Af Amer: >90 mL/min (ref >90)
Glucose, Bld: 113 mg/dL — ABNORMAL HIGH (ref 70–99)
Potassium: 3.8 mEq/L (ref 3.5–5.1)
Sodium: 138 mEq/L (ref 135–145)
Total Protein: 7.2 g/dL (ref 6.0–8.3)

## 2012-12-08 MED ORDER — IOHEXOL 300 MG/ML  SOLN
50.0000 mL | Freq: Once | INTRAMUSCULAR | Status: AC | PRN
  Administered 2012-12-08: 70 mL via ORAL

## 2012-12-08 NOTE — Progress Notes (Signed)
1 Day Post-Op  Subjective: Denies any pain.  No nausea or complaints  Objective: Vital signs in last 24 hours: Temp:  [97.5 F (36.4 C)-98.9 F (37.2 C)] 98.9 F (37.2 C) (02/25 0617) Pulse Rate:  [53-83] 70 (02/25 0617) Resp:  [12-20] 20 (02/25 0617) BP: (125-176)/(65-97) 154/73 mmHg (02/25 0617) SpO2:  [95 %-100 %] 99 % (02/25 0617) Weight:  [423 lb (191.872 kg)] 423 lb (191.872 kg) (02/24 1415) Last BM Date: 12/07/12  Intake/Output from previous day: 02/24 0701 - 02/25 0700 In: 3083.3 [I.V.:3083.3] Out: 1190 [Urine:1000; Drains:130; Blood:50] Intake/Output this shift:    General appearance: alert, cooperative and no distress Resp: nonlabored Cardio: normal rate, regular GI: soft, NT, ND, wounds without infection, JP ss Extremities: SCD's bilat LE  Lab Results:   Recent Labs  12/08/12 0410  WBC 6.2  HGB 11.8*  HCT 37.0*  PLT 244   BMET  Recent Labs  12/08/12 0410  NA 138  K 3.8  CL 103  CO2 27  GLUCOSE 113*  BUN 16  CREATININE 0.82  CALCIUM 8.6   PT/INR No results found for this basename: LABPROT, INR,  in the last 72 hours ABG No results found for this basename: PHART, PCO2, PO2, HCO3,  in the last 72 hours  Studies/Results: No results found.  Anti-infectives: Anti-infectives   Start     Dose/Rate Route Frequency Ordered Stop   12/07/12 0742  ertapenem [facility]) 1 g in sodium chloride 0.9 % 50 mL IVPB     1 g 100 mL/hr over 30 Minutes Intravenous On call to O.R. 12/07/12 0742 12/07/12 1030      Assessment/Plan: s/p Procedure(s) with comments: LAPAROSCOPIC GASTRIC SLEEVE RESECTION (N/A) - laparoscopic sleeve gastrectomy with EGD ESOPHAGOGASTRODUODENOSCOPY (EGD) (N/A) He looks great this am.  feels well too.  HR normal.  HGB stable.  planning for UGI this am and advance diet as tolerated.  LOS: 1 day    Lodema Pilot DAVID 12/08/2012

## 2012-12-08 NOTE — Progress Notes (Signed)
Still doing well.  No pain. Tolerating liquids. HR 71. Abdomen appropriate

## 2012-12-08 NOTE — Progress Notes (Signed)
Patient is alert and oriented.  VSS.  Patient is sitting up in bed, denies any pain.  Patient has ambulated in hallway, using incentive spirometry, and wearing compression hose while in bed.  Patient denies passing gas or bowel movement.  Discharge instructions given to patient for review, will go over in detail prior to discharge.    Quenton Fetter, RN

## 2012-12-08 NOTE — Care Management Note (Signed)
    Page 1 of 1   12/08/2012     11:41:36 AM   CARE MANAGEMENT NOTE 12/08/2012  Patient:  [name] [name], [name] [name]   Account Number:  [id number]  Date Initiated:  12/08/2012  Documentation initiated by:  Lorenda Ishihara  Subjective/Objective Assessment:   57 yo male admitted s/p lap gastric sleeve. PTA lived at home with spouse.     Action/Plan:   Home when stable   Anticipated DC Date:  12/10/2012   Anticipated DC Plan:  HOME/SELF CARE      DC Planning Services  CM consult      Choice offered to / List presented to:             Status of service:  Completed, signed off Medicare Important Message given?   (If response is "NO", the following Medicare IM given date fields will be blank) Date Medicare IM given:   Date Additional Medicare IM given:    Discharge Disposition:  HOME/SELF CARE  Per UR Regulation:  Reviewed for med. necessity/level of care/duration of stay  If discussed at Long Length of Stay Meetings, dates discussed:    Comments:

## 2012-12-08 NOTE — Progress Notes (Signed)
He still feels well.  No complaints.  Had an assisted "fall" earlier in radiology but did not have any injury.  UGI looks okay.  Will try some clears and see how he does with this.  Continue to mobilize.

## 2012-12-08 NOTE — Op Note (Signed)
NAMEDONZELL, COLLER                ACCOUNT NO.:  [id number]  MEDICAL RECORD NO.:  [id number]  LOCATION:  1527                         FACILITY:  [facility]  PHYSICIAN:  Lodema Pilot, MD       DATE OF BIRTH:  Aug 21, 1956  DATE OF PROCEDURE:  12/07/2012 DATE OF DISCHARGE:                              OPERATIVE REPORT   PROCEDURE: 1. Laparoscopic vertical sleeve gastrectomy. 2. Intraoperative upper endoscopy.  PREOPERATIVE DIAGNOSIS:  Morbid obesity.  POSTOPERATIVE DIAGNOSIS:  Morbid obesity.  SURGEON:  Lodema Pilot, MD  ASSISTANT:  Dr. Johna Sheriff.  ANESTHESIA:  General endotracheal anesthesia with 50 mL 1% lidocaine with epinephrine and 0.25% Marcaine in 50:50 mixture.  FLUIDS:  1300 mL of crystalloid.  ESTIMATED BLOOD LOSS:  50-75 mL.  DRAINS:  A 19-French Blake drain placed along the staple line.  SPECIMENS:  Greater curvature of the stomach sent to Pathology for permanent sectioning.  COMPLICATIONS:  None apparent.  FINDINGS:  Vertical sleeve gastrectomy with 34-French bougie, 19-French Blake drain placed along the sleeve staple line.  No evidence of intraoperative leakage.  No evidence of stricture or intraluminal bleeding.  INDICATION FOR PROCEDURE:  Mr. Deutschman is a 57 year old male with a BMI of 60, who has failed medical weight loss attempts and needs the durable weight loss solution.  OPERATIVE DETAILS:  Mr. Crapps was seen and evaluated in the preoperative area and risks and benefits of procedure were discussed in lay terms. Informed consent was obtained.  He was given subcu heparin and prophylactic antibiotics and taken to the operating room, placed on the table in supine position.  General endotracheal anesthesia obtained and Foley catheter was placed.  His abdomen was prepped and draped in a standard surgical fashion.  He had several lesions noted in the skin from his chronic dermatologic condition.  So, I placed an Ioban drape as well to minimize the skin  contact with the wound.  Then the left upper quadrant was accessed with a 5-mm Optiview trocar on the first attempt and pneumoperitoneum was obtained.  Laparoscope was introduced and there was no evidence of bleeding or bowel injury and a 5-mm left rectus port was placed.  A 15-mm right rectus port and a 5 mm right upper quadrant port were placed all under direct visualization.  A Nathanson liver retractor was placed through a separate stab incision to retract the left lobe of the liver.  He had a significant amount of intra-abdominal fat.  We identified the pylorus and measured it out 5 cm from the pylorus and then began dividing the short gastric vessels using the Harmonic scalpel.  I continued the dissection along the greater curvature of the stomach using Harmonic Scalpel to divide the bowel suture and carried this up around the spleen and onto the left crus.  I cleared off it and we mobilized the greater curvature from the from the left crus of diaphragm and from the spleen and divided any posterior gastric attachments using sharp dissection.  After we were confident that we had completely mobilized the stomach.  We checked for any hiatal hernias and none were identified.  Then, we again measured 5 cm from the pylorus and began  dividing the stomach and creating the sleeve. The first 2 firings of the stapler were taken with 60 mm black Tri-Staple loads each.  All the tubes were removed from the stomach and the first firing was taking care to avoid narrowing the stomach at the angularis incisure.  The second black Tri-Staple load was placed on the stomach and angled towards the angle of His and a bougie was passed by the CRNA through the mouth and esophagus and into the stomach and a second firing was taken.  I continued the division of the sleeve in the stomach.  At this point, transitioning tubes purple 60 mm Tri-Staple loads and carrying this up towards the angle of His.  Care was  taken to avoid Christmas tree formation of the staple line and to have even lateral traction to minimize spiraling of the staple line and not to hug the bougie too tightly.  The stomach was completely transected and the final firing of the stapler.  Care was taken to not incorporate any esophagus in the stapler.  Angling slightly off towards the angle of His.  The stomach was completely transected and hemostasis was noted to be adequate along the staple line.  At this point, I filled the abdomen with sterile saline, and Dr. Johna Sheriff passed the endoscope through the mouth and esophagus into the sleeve.  He advanced this while he insufflated with air.  I was checking for any air bubbles and none were identified.  The sleeve was easily navigated with the scope. There was no evidence of internal bleeding.  No evidence of stricturing and the scope was easily driven to the pylorus and antrum.  He suctioned the air and the scope was removed.  I then applied Tisseel fibrin glue along the staple line.  Then, the 15-mm port site was enlarged to accommodate retrieval of the resected stomach and the stomach was removed and sent for Pathology.  The trocar was placed back in the abdomen and the abdomen was noted to be hemostatic.  There was no evidence of bleeding or bowel injury.  Then, a 19-French Blake drain was passed into the abdomen and placed just posterior to the 3 staple lines and exited through the left upper quadrant trocar site.  It was sutured in place with a nylon drain stitch.  The omentum was placed up over the drain and over the sleeve.  The [facility] liver retractor was removed and I then approximated the fascia at the stomach extraction site with interrupted 0 Vicryl sutures in an open fashion.  Sutures were secured and the abdomen was re-insufflated with carbon dioxide gas.  The abdominal wall closed was noted be adequate and there was no evidence of bleeding or bowel injury and the  sleeves and drain appeared to be appropriate.  The final trocars removed under direct visualization and the wounds were irrigated with sterile saline solution and injected with a total of 50 mL of 1% lidocaine with epinephrine and 0.25% Marcaine in a 50:50 mixture.  Skin was washed and dried and Dermabond was applied.  All sponge, needle, and instrument counts were correct in the case.  The patient tolerated the procedure well without apparent complication.  The Foley catheter was removed and he was hemodynamically stable and ready for transfer to recovery room in stable condition.          ______________________________ Lodema Pilot, MD     BL/MEDQ  D:  12/07/2012  T:  12/08/2012  Job:  [id number]

## 2012-12-09 MED ORDER — OXYCODONE-ACETAMINOPHEN 5-325 MG/5ML PO SOLN: 5.0000 mL | ORAL | Status: DC | PRN

## 2012-12-09 MED ORDER — ONDANSETRON 4 MG PO TBDP: 4.0000 mg | ORAL_TABLET | Freq: Three times a day (TID) | ORAL | Status: DC | PRN

## 2012-12-09 NOTE — Progress Notes (Signed)
2 Days Post-Op  Subjective: He continues to feel well.  Tolerating liquids. No pain or nausea  Objective: Vital signs in last 24 hours: Temp:  [97.8 F (36.6 C)-98.9 F (37.2 C)] 98.9 F (37.2 C) (02/26 0650) Pulse Rate:  [66-79] 69 (02/26 0650) Resp:  [18] 18 (02/26 0650) BP: (125-178)/(76-91) 178/91 mmHg (02/26 0650) SpO2:  [94 %-99 %] 97 % (02/26 0650) Last BM Date: 12/07/12  Intake/Output from previous day: 02/25 0701 - 02/26 0700 In: 2989.6 [I.V.:2989.6] Out: 850 [Urine:770; Drains:80] Intake/Output this shift:    General appearance: alert, cooperative and no distress Resp: nonlabored Cardio: normal rate, regular GI: soft, no significant tenderness, wounds okay, he does have a skin blister inferior and lateral to extraction site, JP with thin serous output, no peritoneal signs Extremities: SCD's bilat LE  Lab Results:   Recent Labs  12/08/12 0410  WBC 6.2  HGB 11.8*  HCT 37.0*  PLT 244   BMET  Recent Labs  12/08/12 0410  NA 138  K 3.8  CL 103  CO2 27  GLUCOSE 113*  BUN 16  CREATININE 0.82  CALCIUM 8.6   PT/INR No results found for this basename: LABPROT, INR,  in the last 72 hours ABG No results found for this basename: PHART, PCO2, PO2, HCO3,  in the last 72 hours  Studies/Results: Dg Ugi W/water Sol Cm  12/08/2012  *RADIOLOGY REPORT*  Clinical Data:  Postop from sleeve gastrectomy.  Nausea.  UPPER GI SERIES WITH KUB  Technique:  Routine upper GI series was performed with water- soluble Omnipaque-300  Fluoroscopy Time: 1.3 minutes  Comparison:  None.  Findings: Scout radiograph shows no evidence of dilated bowel loops.  Upper GI series performed with Omnipaque contrast shows esophageal dysmotility and esophageal stasis.  There is no evidence of esophageal mass or stricture.  The gastric sleeve is normal and the location.  There is prompt passage of contrast through the gastric sleeve, with contrast opacification of the duodenum.  There is no evidence  of contrast leak or extravasation.  IMPRESSION: Expected postoperative appearance status post sleeve gastrectomy. No evidence of postop leak or obstruction.   Original Report Authenticated By: Myles Rosenthal, M.D.     Anti-infectives: Anti-infectives   Start     Dose/Rate Route Frequency Ordered Stop   12/07/12 0742  ertapenem [facility]) 1 g in sodium chloride 0.9 % 50 mL IVPB     1 g 100 mL/hr over 30 Minutes Intravenous On call to O.R. 12/07/12 0742 12/07/12 1030      Assessment/Plan: s/p Procedure(s) with comments: LAPAROSCOPIC GASTRIC SLEEVE RESECTION (N/A) - laparoscopic sleeve gastrectomy with EGD ESOPHAGOGASTRODUODENOSCOPY (EGD) (N/A) He continues to look and feel well.  No evidence of any postop complications.  He should be okay for discharge to home later today.D/c instructions reviewed   LOS: 2 days    Lodema Pilot DAVID 12/09/2012

## 2012-12-09 NOTE — Progress Notes (Signed)
Was the fall witnessed: Yes  Patient condition before and after the fall: Amuulating   Patient's reaction to the fall:Understanding   Name of the doctor that was notified including date and time:Dr. Layne Benton, and Dr. Biagio Quint  Any interventions and vital signs:N/A

## 2012-12-09 NOTE — Progress Notes (Signed)
Pt for d/c home today. Dermabond intact to abdomen. IV d/c'd. Tolerated protein shake w/o problems. Wife at bedside to assist with d/c. D/C instructions & RX given with verbalized understanding. Bariatric RN gave d/c instructions to both pt & witfe as well.

## 2012-12-09 NOTE — Discharge Summary (Signed)
Physician Discharge Summary  Patient ID: [name] [name] MRN: [id number] DOB/AGE: Jan 27, 1956 57 y.o.  Admit date: 12/07/2012 Discharge date: 12/09/2012  Admission Diagnoses: obesity  Discharge Diagnoses: obesity Active Problems:   * No active hospital problems. *   Discharged Condition: stable  Hospital Course: to OR 12/07/12 for lap sleeve gastrectomy. No apparent complications. UGI obtained on POD 1 which was normal postop.  Diet advanced and he tolerated this well.  He was stable and ready for discharge on POD 2.  Consults: None  Significant Diagnostic Studies: radiology: UGI  Treatments: surgery: 12/07/12 sleeve gastrectomy  Disposition:   Discharge Orders   Future Appointments Provider Department Dept Phone   12/22/2012 4:00 PM [facility] Post-Op Class [facility] [phone]   12/25/2012 8:30 AM Lodema Pilot, DO [facility], [place] [phone]   Future Orders Complete By Expires     Call MD for:  difficulty breathing, headache or visual disturbances  As directed     Call MD for:  persistant dizziness or light-headedness  As directed     Call MD for:  persistant nausea and vomiting  As directed     Call MD for:  redness, tenderness, or signs of infection (pain, swelling, redness, odor or green/yellow discharge around incision site)  As directed     Call MD for:  severe uncontrolled pain  As directed     Call MD for:  temperature >100.4  As directed     Discharge instructions  As directed     Comments:      Call [phone] for follow up appointment with Dr. Biagio Quint in 3 weeks. May shower tomorrow.  Cover drain site with clean gauze until there is no leakage. Increase activity as tolerated.  Ambulate with assistance. May start protein supplements and vitamins. Use your CPAP machine as already prescribed. Full liquid diet x1 week, then pureed diet x1 week, then soft diet x1 week, then slowly advance to high protein, low fat, low  carbohydrate diet as tolerated    Increase activity slowly  As directed         Medication List    STOP taking these medications       oxyCODONE 5 MG immediate release tablet  Commonly known as:  Oxy IR/ROXICODONE      TAKE these medications       calcium carbonate 500 MG chewable tablet  Commonly known as:  TUMS - dosed in mg elemental calcium  Chew 1 tablet by mouth daily as needed for heartburn.     clobetasol ointment 0.05 %  Commonly known as:  TEMOVATE  Apply 1 application topically 2 (two) times daily as needed (apply after shower).     ferrous sulfate 325 (65 FE) MG tablet  Take 325 mg by mouth daily with breakfast.     multivitamin with minerals Tabs  Take 1 tablet by mouth daily.     ondansetron 4 MG disintegrating tablet  Commonly known as:  ZOFRAN ODT  Take 1 tablet (4 mg total) by mouth every 8 (eight) hours as needed for nausea.     oxaprozin 600 MG tablet  Commonly known as:  DAYPRO  Take 600 mg by mouth daily as needed (for inflammation). Will stop 12/06/12     oxyCODONE-acetaminophen 5-325 MG/5ML solution  Commonly known as:  ROXICET  Take 5-10 mLs by mouth every 4 (four) hours as needed.     pregabalin 75 MG capsule  Commonly known as:  LYRICA  Take 75 mg by mouth 2 (two) times daily as needed (for inflammation).         SignedLodema Pilot DAVID 12/09/2012, 7:44 AM

## 2012-12-09 NOTE — Progress Notes (Signed)
Patient is alert and oriented, sitting up in bed.  VSS. Patient denies passing gas or bowel movement.  Patient is ambulating in hallway, using incentive spirometry, and wearing compression hose while in bed.  Patient denies abdominal pain.  Discharge instructions listed below reviewed with patient and spouse.  Patient verbalized understanding.  Patient is aware of support group and BELT program.  Patient has follow appointments with [facility] and the [facility].    Quenton Fetter, Rn  GASTRIC BYPASS/SLEEVE DISCHARGE INSTRUCTIONS  Drs. Fredrik Rigger, Hoxworth, Wilson, and Chewey Call if you have any problems.   Call [phone] and ask for the surgeon on call.    If you need immediate assistance come to the ER at [facility]. Tell the ER personnel that you are a new post-op gastric bypass patient. Signs and symptoms to report:   Severe vomiting or nausea. If you cannot tolerate clear liquids for longer than 1 day, you need to call your surgeon.    Abdominal pain which does not get better after taking your pain medication   Fever greater than 101 F degree   Difficulty breathing   Chest pain    Redness, swelling, drainage, or foul odor at incision sites    If your incisions open or pull apart   Swelling or pain in calf (lower leg)   Diarrhea, frequent watery, uncontrolled bowel movements.   Constipation, (no bowel movements for 3 days) if this occurs, Take Milk of Magnesia, 2 tablespoons by mouth, 3 times a day for 2 days if needed.  Call your doctor if constipation continues. Stop taking Milk of Magnesia once you have had a bowel movement. You may also use Miralax according to the label instructions.   Anything you consider "abnormal for you".   Normal side effects after Surgery:   Unable to sleep at night or concentrate   Irritability   Being tearful (crying) or depressed   These are common complaints, possibly related to your anesthesia, stress of surgery and change in lifestyle, that usually go away  a few weeks after surgery.  If these feelings continue, call your medical doctor.  Wound Care You may have surgical glue, steri-strips, or staples over your incisions after surgery.  Surgical glue:  Looks like a clear film over your incisions and will wear off gradually. Steri-strips: Strips of tape over your incisions. You may notice a yellowish color on the skin underneath the steri-strips. This is a substance used to make the steri-strips stick better. Do not pull the steri-strips off - let them fall off.  Staples: Cherlynn Polo may be removed before you leave the hospital. If you go home with staples, call [facility] [phone]) for an appointment with your surgeon's nurse to have staples removed in 7 - 10 days. Showering: You may shower two days after your surgery unless otherwise instructed by your surgeon. Wash gently around wounds with warm soapy water, rinse well, and gently pat dry.  If you have a drain, you may need someone to hold this while you shower. Avoid tub baths until staples are removed and incisions are healed.    Medications   Medications should be liquid or crushed if larger than the size of a dime.  Extended release pills should not be crushed.   Depending on the size and number of medications you take, you may need to stagger/change the time you take your medications so that you do not over-fill your pouch.    Make sure you follow-up with your  primary care physician to make medication adjustments needed during rapid weight loss and life-style adjustment.   If you are diabetic, follow up with the doctor that prescribes your diabetes medication(s) within one week after surgery and check your blood sugar regularly.   Do not drive while taking narcotics!   Do not take acetaminophen (Tylenol) and Roxicet or Lortab Elixir at the same time since these pain medications contain acetaminophen.  Diet at home: (First 2 Weeks) You will see the nutritionist two weeks after  your surgery. She will advance your diet if you are tolerating liquids well. Once at home, if you have severe vomiting or nausea and cannot tolerate clear liquids lasting longer than 1 day, call your surgeon.  Begin high protein shake 2 ounces every 3 hours, 5 - 6 times per day.  Gradually increase the amount you drink as tolerated.  You may find it easier to slowly sip shakes throughout the day.  It is important to get your proteins in first.   Protein Shake   Drink at least 2 ounces of shake 5-6 times per day   Each serving of protein shakes should have a minimum of 15 grams of protein and no more than 5 grams of carbohydrate    Increase the amount of protein shake you drink as tolerated   Protein powder may be added to fluids such as non-fat milk or Lactaid milk (limit to 20 grams added protein powder per serving   The initial goal is to drink at least 8 ounces of protein shake/drink per day (or as directed by the nutritionist). Some examples of protein shakes are [employer], [employer]illard[employer], EAS Edge HP, and Unjury. Hydration   Gradually increase the amount of water and other liquids as tolerated (See Acceptable Fluids)   Gradually increase the amount of protein shake as tolerated     Sip fluids slowly and throughout the day   May use Sugar substitutes, use sparingly (limit to 6 - 8 packets per day). Your fluid goal is 64 ounces of fluid daily. It may take a few weeks to build up to this.         32 oz (or more) should be clear liquids and 32 oz (or more) should be full liquids.         Liquids should not contain sugar, caffeine, or carbonation! Acceptable Fluids Clear Liquids:   Water or Sugar-free flavored water, Fruit H2O   Decaffeinated coffee or tea (sugar-free)   Crystal Lite, Wyler's Lite, Minute Maid Lite   Sugar-free Jell-O   Bouillon or broth   Sugar-free Popsicle:   *Less than 20 calories each; Limit 1 per day   Full Liquids:              Protein Shakes/Drinks + 2  choices per day of other full liquids shown below.    Other full liquids must be: No more than 12 grams of Carbs per serving,  No more than 3 grams of Fat per serving   Strained low-fat cream soup   Non-Fat milk   Fat-free Lactaid Milk   Sugar-free yogurt (Dannon Lite & Fit) Vitamins and Minerals (Start 1 day after surgery unless otherwise directed)   2 Chewable Multivitamin / Multimineral Supplement (i.e. Centrum for Adults)   Chewable Calcium Citrate with Vitamin D-3. Take 1500 mg each day.           (Example: 3 Chewable Calcium Plus 600 with Vitamin D-3 can be found at [facility])  Vitamin B-12, 350 - 500 micrograms (oral tablet) each day   Do not mix multivitamins containing iron with calcium supplements; take 2 hours   apart   Do not substitute Tums (calcium carbonate) for your calcium   Menstruating women and those at risk for anemia may need extra iron. Talk with your doctor to see if you need additional iron.    If you need extra iron:  Total daily Iron recommendations (including Vitamins) = 50 - 100 mg Iron/day Do not stop taking or change any vitamins or minerals until you talk to your nutritionist or surgeon. Your nutritionist and / or physician must approve all vitamin and mineral supplements. Exercise For maximum success, begin exercising as soon as your doctor recommends. Make sure your physician approves any physical activity.   Depending on fitness level, begin with a simple walking program   Walk 5-15 minutes each day, 7 days per week.    Slowly increase until you are walking 30-45 minutes per day   Consider joining our BELT program. [phone] or email belt@[employer] .edu Things to remember:    You may have sexual relations when you feel comfortable. It is VERY important for male patients to use a reliable birth control method. Fertility often increases after surgery. Do not get pregnant for at least 18 months.   It is very important to keep all follow up appointments  with your surgeon, nutritionist, primary care physician, and behavioral health practitioner. After the first year, please follow up with your bariatric surgeon at least once a year in order to maintain best weight loss results.  [facility]: [phone] [facility]: [phone]   Free counseling is available for you and your family through collaboration between [facility] and [place]. Please call [phone] and leave a message.    Consider purchasing a medical alert bracelet that says you had gastric bypass surgery.    The [facility] has a free Bariatric Surgery Support Group that meets monthly, the 3rd Thursday, 6 pm, Classroom #1, [place]. You may register online at [email or web address], but registration is not necessary. Select Classes and Support Groups, Bariatric Surgery, or Call [phone]   Do not return to work or drive until cleared by your surgeon   Use your CPAP when sleeping if applicable   Do not lift anything greater than ten pounds for at least two weeks

## 2012-12-13 ENCOUNTER — Emergency Department (HOSPITAL_COMMUNITY)
Admission: EM | Admit: 2012-12-13 | Discharge: 2012-12-13 | Disposition: A | Discharge status: Home / Self Care | Payer: Federal, State, Local not specified - PPO | Attending: Emergency Medicine | Admitting: Emergency Medicine

## 2012-12-13 ENCOUNTER — Emergency Department (HOSPITAL_COMMUNITY): Payer: Federal, State, Local not specified - PPO

## 2012-12-13 DIAGNOSIS — Z872 Personal history of diseases of the skin and subcutaneous tissue: Secondary | ICD-10-CM | POA: Insufficient documentation

## 2012-12-13 DIAGNOSIS — Z8719 Personal history of other diseases of the digestive system: Secondary | ICD-10-CM | POA: Insufficient documentation

## 2012-12-13 DIAGNOSIS — Z79899 Other long term (current) drug therapy: Secondary | ICD-10-CM | POA: Insufficient documentation

## 2012-12-13 DIAGNOSIS — F17200 Nicotine dependence, unspecified, uncomplicated: Secondary | ICD-10-CM | POA: Insufficient documentation

## 2012-12-13 DIAGNOSIS — Y838 Other surgical procedures as the cause of abnormal reaction of the patient, or of later complication, without mention of misadventure at the time of the procedure: Secondary | ICD-10-CM | POA: Insufficient documentation

## 2012-12-13 DIAGNOSIS — T8131XA Disruption of external operation (surgical) wound, not elsewhere classified, initial encounter: Secondary | ICD-10-CM | POA: Insufficient documentation

## 2012-12-13 DIAGNOSIS — I10 Essential (primary) hypertension: Secondary | ICD-10-CM | POA: Insufficient documentation

## 2012-12-13 DIAGNOSIS — Z87891 Personal history of nicotine dependence: Secondary | ICD-10-CM | POA: Insufficient documentation

## 2012-12-13 DIAGNOSIS — Z8669 Personal history of other diseases of the nervous system and sense organs: Secondary | ICD-10-CM | POA: Insufficient documentation

## 2012-12-13 DIAGNOSIS — D649 Anemia, unspecified: Secondary | ICD-10-CM | POA: Insufficient documentation

## 2012-12-13 DIAGNOSIS — Z8739 Personal history of other diseases of the musculoskeletal system and connective tissue: Secondary | ICD-10-CM | POA: Insufficient documentation

## 2012-12-13 DIAGNOSIS — M129 Arthropathy, unspecified: Secondary | ICD-10-CM | POA: Insufficient documentation

## 2012-12-13 NOTE — ED Notes (Addendum)
Pt presents post op 2/24 from gastric sleeve surgery. Now has one open wound just above his umbilius. States wound opened on Friday evening and he started putting dry gauze over it. Has not spoken w/ his surgeon

## 2012-12-13 NOTE — ED Notes (Signed)
Dry 4x4 dressing applied to abdomen. Tolerated well

## 2012-12-13 NOTE — ED Provider Notes (Signed)
History     CSN: [id number]  Arrival date & time 12/13/12  4098   First MD Initiated Contact with Patient 12/13/12 [phone]      Chief Complaint  Patient presents with  . Post-op Problem    (Consider location/radiation/quality/duration/timing/severity/associated sxs/prior treatment) The history is provided by the patient.  [name] [name] is a 57 y.o. male history of morbid obesity status post gastric sleeve on February 24 here presenting with wound dehiscence. He had gastric sleeve surgery with no postop complications. Went home 6 days ago was tolerating by mouth. About 3 days ago he noticed that the wound is coming apart. Denies any abdominal pain or vomiting. No bowel coming out of the wound. Hasn't called his surgeon yet.     Past Medical History  Diagnosis Date  . Anemia   . Arthritis   . Spinal stenosis   . Obstructive sleep apnea   . Morbid obesity   . Hypertension   . GERD (gastroesophageal reflux disease)   . Prurigo 2002    Past Surgical History  Procedure Laterality Date  . Back surgery  01/23/2010    lumbar  . Laparoscopic gastric sleeve resection N/A 12/07/2012    Procedure: LAPAROSCOPIC GASTRIC SLEEVE RESECTION;  Surgeon: Lodema Pilot, DO;  Location: [facility];  Service: General;  Laterality: N/A;  laparoscopic sleeve gastrectomy with EGD  . Esophagogastroduodenoscopy N/A 12/07/2012    Procedure: ESOPHAGOGASTRODUODENOSCOPY (EGD);  Surgeon: Lodema Pilot, DO;  Location: [facility];  Service: General;  Laterality: N/A;    No family history on file.  History  Substance Use Topics  . Smoking status: Former Smoker    Types: Cigarettes    Quit date: 04/09/1974  . Smokeless tobacco: Never Used  . Alcohol Use: No      Review of Systems  Skin: Positive for wound.  All other systems reviewed and are negative.    Allergies  Unisom  Home Medications   Current Outpatient Rx  Name  Route  Sig  Dispense  Refill  . calcium citrate-vitamin D (CITRACAL+D) 315-200 MG-UNIT  per tablet   Oral   Take 1 tablet by mouth 3 (three) times daily.         . clobetasol ointment (TEMOVATE) 0.05 %   Topical   Apply 1 application topically 2 (two) times daily as needed (apply after shower).          . Multiple Vitamin (MULTIVITAMIN WITH MINERALS) TABS   Oral   Take 1 tablet by mouth daily.         Marland Kitchen oxyCODONE-acetaminophen (ROXICET) 5-325 MG/5ML solution   Oral   Take 5-10 mLs by mouth every 4 (four) hours as needed.   500 mL   0   . vitamin B-12 (CYANOCOBALAMIN) 1000 MCG tablet   Oral   Take 1,000 mcg by mouth daily.           BP 100/77  Pulse 74  Temp(Src) 97.9 F (36.6 C) (Oral)  Resp 18  SpO2 94%  Physical Exam  Nursing note and vitals reviewed. Constitutional: He is oriented to person, place, and time. He appears well-developed and well-nourished.  Obese, NAD   HENT:  Head: Normocephalic.  Mouth/Throat: Oropharynx is clear and moist.  Eyes: Conjunctivae are normal. Pupils are equal, round, and reactive to light.  Neck: Normal range of motion. Neck supple.  Cardiovascular: Normal rate, regular rhythm and normal heart sounds.   Pulmonary/Chest: Effort normal and breath sounds normal. No respiratory distress. He has no wheezes.  He has no rales.  Abdominal: Soft. Bowel sounds are normal.  Nontender. There is a 2 inch horizontal wound with dehiscence. Doesn't seem to involve the peritoneum. Not actively bleeding or having purulent drainage. No surrounding erythema.    Musculoskeletal: Normal range of motion.  Neurological: He is alert and oriented to person, place, and time.  Skin: Skin is warm and dry.  See abdominal exam   Psychiatric: He has a normal mood and affect. His behavior is normal. Judgment and thought content normal.    ED Course  Procedures (including critical care time)  Labs Reviewed - No data to display Dg Abd Acute W/chest  12/13/2012  *RADIOLOGY REPORT*  Clinical Data: Post gastric sleeve.  Pain.  ACUTE ABDOMEN SERIES  (ABDOMEN 2 VIEW & CHEST 1 VIEW)  Comparison: 12/08/2012  Findings: There is a nonobstructive bowel gas pattern.  No free air organomegaly.  Oral contrast material within decompressed colon.  Heart is normal size.  Small left pleural effusion with left base atelectasis.  IMPRESSION: No evidence of bowel obstruction or free air.  Small left pleural effusion with left base atelectasis.   Original Report Authenticated By: Charlett Nose, M.D.      No diagnosis found.    MDM  [name] [name] is a 57 y.o. male here with wound dehiscence. Xray showed no obstruction and patient is tolerating PO well and I am not concerned for SBO. The wound seemed superficial and doesn't appear to be infected. I called Dr. Luisa Hart who is covering the group. He said that patient can f/u in clinic tomorrow. Return precautions given.         Richardean Canal, MD 12/13/12 1026

## 2012-12-22 ENCOUNTER — Encounter (INDEPENDENT_AMBULATORY_CARE_PROVIDER_SITE_OTHER): Payer: Self-pay | Admitting: General Surgery

## 2012-12-22 ENCOUNTER — Ambulatory Visit (INDEPENDENT_AMBULATORY_CARE_PROVIDER_SITE_OTHER): Payer: Federal, State, Local not specified - PPO | Admitting: General Surgery

## 2012-12-22 ENCOUNTER — Encounter: Payer: Federal, State, Local not specified - PPO | Attending: General Surgery | Admitting: *Deleted

## 2012-12-22 VITALS — BP 150/78 | HR 84 | Temp 98.8°F | Resp 18 | Ht 70.0 in | Wt >= 6400 oz

## 2012-12-22 DIAGNOSIS — Z01818 Encounter for other preprocedural examination: Secondary | ICD-10-CM | POA: Insufficient documentation

## 2012-12-22 DIAGNOSIS — Z713 Dietary counseling and surveillance: Secondary | ICD-10-CM | POA: Insufficient documentation

## 2012-12-22 DIAGNOSIS — Z5189 Encounter for other specified aftercare: Secondary | ICD-10-CM

## 2012-12-22 NOTE — Progress Notes (Signed)
Subjective:     Patient ID: Brix Brearley, male   DOB: [birth year], 57 y.o.   MRN: [id number]  HPI This patient follows up 2 weeks status post laparoscopic vertical sleeve gastrectomy. He has been doing remarkably well. He has had good weight loss and has no food intolerances. He is walking and he says his bowels are functioning normally. He has no pain or nausea or vomiting or fevers or chills. Denies any neurologic problems  Review of Systems     Objective:   Physical Exam No distress and nontoxic-appearing His abdomen is soft and nontender and exam his incisions are healing well. He did have his extraction site opened up and he's been doing dressing changes for this. There is no evidence of residual infection and I packed this with a wet-to-dry gauze today. It is our a fairly superficial and should take much longer to heal.     Assessment:     Status post vertical sleeve gastrectomy-doing well He is doing very well and has lost about 16 pounds since his procedure. He has no food intolerances and no evidence of any postoperative complication. I recommended that he gradually increase his ambulation as much as tolerated and continue with his vitamins and protein supplementation. Otherwise I really have nothing else to add.     Plan:     Increase ambulation and physical activity and he will followup in about one month

## 2012-12-23 ENCOUNTER — Encounter: Payer: Self-pay | Admitting: *Deleted

## 2012-12-23 NOTE — Patient Instructions (Signed)
Patient to follow Phase 3A-Soft, High Protein Diet and follow-up at [facility] in 6 weeks for 2 months post-op nutrition visit for diet advancement. 

## 2012-12-23 NOTE — Progress Notes (Signed)
Bariatric Class:  Appt start time: 1600   End time: 1700.  2 Week Post-Operative Nutrition Class  Patient was seen on 12/22/12 for Post[facility]ative [facility]   Surgery date: 12/07/12  Surgery type: Sleeve  Start weight at [facility]: 422.5 lbs (05/01/12)  Pre-Op Class:  425.6 lbs (12/03/12)  Weight today: 404.0 lbs Weight change:  21.6 lbs Total weight lost: 21.6 lbs  Weight loss goal: 200-250 lbs % goal met: 10-12%  TANITA  BODY COMP RESULTS  12/22/12   BMI (kg/m^2) 58.0   Fat Mass (lbs) 172.5   Fat Free Mass (lbs) 231.5   Total Body Water (lbs) 169.5   The following the learning objectives were met by the patient during this course:  Identifies Phase 3A (Soft, High Proteins) Dietary Goals and will begin from 2 weeks post-operatively to 2 months post-operatively  Identifies appropriate sources of fluids and proteins   States protein recommendations and appropriate sources post-operatively  Identifies the need for appropriate texture modifications, mastication, and bite sizes when consuming solids  Identifies appropriate multivitamin and calcium sources post-operatively  Describes the need for physical activity post-operatively and will follow MD recommendations  States when to call healthcare provider regarding medication questions or post-operative complications  Handouts given during class include:  Phase 3A: Soft, High Protein Diet Handout  Follow-Up Plan: Patient will follow-up at [facility] in 6 weeks for 2 months post-op nutrition visit for diet advancement per MD.

## 2012-12-25 ENCOUNTER — Ambulatory Visit (INDEPENDENT_AMBULATORY_CARE_PROVIDER_SITE_OTHER): Payer: Federal, State, Local not specified - PPO | Admitting: General Surgery

## 2013-01-21 ENCOUNTER — Ambulatory Visit (INDEPENDENT_AMBULATORY_CARE_PROVIDER_SITE_OTHER): Payer: Federal, State, Local not specified - PPO | Admitting: General Surgery

## 2013-01-21 ENCOUNTER — Encounter (INDEPENDENT_AMBULATORY_CARE_PROVIDER_SITE_OTHER): Payer: Self-pay | Admitting: General Surgery

## 2013-01-21 VITALS — BP 132/84 | HR 71 | Temp 97.0°F | Resp 18 | Ht 70.0 in | Wt 386.6 lb

## 2013-01-21 DIAGNOSIS — Z5189 Encounter for other specified aftercare: Secondary | ICD-10-CM

## 2013-01-21 DIAGNOSIS — Z4889 Encounter for other specified surgical aftercare: Secondary | ICD-10-CM

## 2013-01-21 LAB — CBC WITH DIFFERENTIAL/PLATELET
Basophils Relative: 0 % (ref 0–1)
Eosinophils Absolute: 0.2 10*3/uL (ref 0.0–0.7)
Hemoglobin: 12.2 g/dL — ABNORMAL LOW (ref 13.0–17.0)
Lymphs Abs: 1.7 10*3/uL (ref 0.7–4.0)
MCHC: 33.5 g/dL (ref 30.0–36.0)
Monocytes Relative: 10 % (ref 3–12)
Neutro Abs: 3.4 10*3/uL (ref 1.7–7.7)
Neutrophils Relative %: 57 % (ref 43–77)
Platelets: 260 10*3/uL (ref 150–400)
RBC: 4.91 MIL/uL (ref 4.22–5.81)

## 2013-01-21 LAB — COMPREHENSIVE METABOLIC PANEL
ALT: 29 U/L (ref 0–53)
AST: 20 U/L (ref 0–37)
Albumin: 3.8 g/dL (ref 3.5–5.2)
Alkaline Phosphatase: 93 U/L (ref 39–117)
Glucose, Bld: 84 mg/dL (ref 70–99)
Potassium: 4.1 mEq/L (ref 3.5–5.3)
Sodium: 141 mEq/L (ref 135–145)
Total Bilirubin: 0.5 mg/dL (ref 0.3–1.2)
Total Protein: 6.9 g/dL (ref 6.0–8.3)

## 2013-01-21 NOTE — Progress Notes (Signed)
Subjective:     Patient ID: [name] [name], male   DOB: 1956-04-19, 57 y.o.   MRN: [id number]  HPI This patient follows up about 6 weeks status post vertical sleeve gastrectomy. He is doing very well has no complaints. He feels very well and has more energy. He has lost about 37 pounds. He had a postoperative wound infection which has subsequently healed. He has no food intolerances and states his bowels are functioning normally. He is taking vitamins and protein supplements and again, has no complaints.  Review of Systems     Objective:   Physical Exam No distress and nontoxic-appearing His incisions are healing well without sign of infection. His extraction site has completely healed    Assessment:     Status post vertical sleeve gastrectomy-doing well He is doing very well from weight loss standpoint. He has no complaints. I think that he is doing very well and seems very motivated. I recommended that he continue with his physical activity and high-protein diet and I will see him back in about 2 months for repeat evaluation. We will go and check some basic labs at this time.     Plan:     Continue with exercise and low-calorie diet and I'll see him back in 2 months

## 2013-02-02 ENCOUNTER — Encounter: Payer: Federal, State, Local not specified - PPO | Attending: General Surgery | Admitting: *Deleted

## 2013-02-02 ENCOUNTER — Encounter: Payer: Self-pay | Admitting: *Deleted

## 2013-02-02 DIAGNOSIS — Z01818 Encounter for other preprocedural examination: Secondary | ICD-10-CM | POA: Insufficient documentation

## 2013-02-02 DIAGNOSIS — Z713 Dietary counseling and surveillance: Secondary | ICD-10-CM | POA: Insufficient documentation

## 2013-02-02 NOTE — Patient Instructions (Addendum)
Goals:  Follow Phase 3B: High Protein + Non-Starchy Vegetables  Eat 3-6 small meals/snacks, every 3-5 hrs  Increase lean protein foods to meet 80g goal  Increase fluid intake to 64oz +  Avoid drinking 15 minutes before, during and 30 minutes after eating  Aim for >30 min of physical activity daily 

## 2013-02-02 NOTE — Progress Notes (Signed)
  Follow-up visit:  8 Weeks Post-Operative Gastric Sleeve Surgery  Medical Nutrition Therapy:  Appt start time: 0800   End time:  0830.  Primary concerns today: Post-operative Bariatric Surgery Nutrition Management. Doing well with no problems reported. Has lost 40 lbs of FAT MASS since 12/03/12!!  Surgery date: 12/07/12  Surgery type: Sleeve  Start weight at [facility]: 422.5 lbs (05/01/12)  Pre-Op Class:  425.6 lbs (12/03/12)  Weight today: 375.5 lbs Weight change: 28.5 lbs Total weight lost: 50.1 lbs  Weight loss goal: 200-250 lbs % goal met: 22-29%  TANITA  BODY COMP RESULTS  12/22/12 02/02/13   BMI (kg/m^2) 58.0 53.9   Fat Mass (lbs) 172.5 132.5   Fat Free Mass (lbs) 231.5 243.0   Total Body Water (lbs) 169.5 178.0   Fluid intake: > 64 oz Estimated total protein intake: 70-80g  Medications: See med list Supplementation: Taking regularly; Only 1 MVI noted  Using straws: No Drinking while eating: No Hair loss: No Carbonated beverages: No N/V/D/C: No Dumping syndrome: No  Recent physical activity:  5 days a week at home; strength training and walking  Progress Towards Goal(s):  In progress.  Handouts given during visit include:  Phase 3B: High Protein + Non-Starchy Vegetables   Nutritional Diagnosis:  [place]-3.3 Overweight/obesity related to past poor dietary habits and physical inactivity as evidenced by patient w/ recent gastric sleeve surgery following dietary guidelines for continued weight loss.    Intervention:  Nutrition education/diet advancement.  Monitoring/Evaluation:  Dietary intake, exercise, and body weight. Follow up in 1 months for 3 month post-op visit.

## 2013-03-02 ENCOUNTER — Ambulatory Visit: Payer: Federal, State, Local not specified - PPO | Admitting: *Deleted

## 2013-03-04 ENCOUNTER — Encounter: Payer: Self-pay | Admitting: *Deleted

## 2013-03-04 ENCOUNTER — Encounter: Payer: Federal, State, Local not specified - PPO | Attending: General Surgery | Admitting: *Deleted

## 2013-03-04 DIAGNOSIS — Z01818 Encounter for other preprocedural examination: Secondary | ICD-10-CM | POA: Insufficient documentation

## 2013-03-04 DIAGNOSIS — Z713 Dietary counseling and surveillance: Secondary | ICD-10-CM | POA: Insufficient documentation

## 2013-03-04 NOTE — Patient Instructions (Addendum)
Goals:  Follow Phase 3B: High Protein + Non-Starchy Vegetables  Eat 3-6 small meals/snacks, every 3-5 hrs  Increase lean protein foods to meet 80-100g goal  May add 15 grams of carbohydrate (fruit, whole grain, starchy vegetable) with meals  Always have a protein serving (6-10g protein) with carbs   Avoid drinking 15 minutes before, during and 30 minutes after eating  Aim for >30 min of physical activity daily

## 2013-03-04 NOTE — Progress Notes (Signed)
  Follow-up visit:  12 Weeks Post-Operative Gastric Sleeve Surgery  Medical Nutrition Therapy:  Appt start time:  400   End time:  430.  Primary concerns today: Post-operative Bariatric Surgery Nutrition Management. Doing well with no problems reported. Next goal is to be <350 lbs.  Surgery date: 12/07/12  Surgery type: Sleeve Gastrectomy  Start weight at [facility]: 422.5 lbs (05/01/12)  Pre-Op Class:  425.6 lbs (12/03/12)  Weight today: 372.0 lbs Weight change: 3.5 lbs Total weight lost: 53.6 lbs  Weight loss goal: 200-250 lbs % goal met: 24-31%  TANITA  BODY COMP RESULTS  12/22/12 02/02/13 03/04/13   BMI (kg/m^2) 58.0 53.9 53.4   Fat Mass (lbs) 172.5 132.5 131.5   Fat Free Mass (lbs) 231.5 243.0 240.5   Total Body Water (lbs) 169.5 178.0 176.0   Fluid intake: > 64 oz Estimated total protein intake: 70-80g  Medications: See med list Supplementation: Taking regularly  Using straws: No Drinking while eating: No Hair loss: No Carbonated beverages: No N/V/D/C:  One episode of GERD in April after eating sardines. No problems since.  Dumping syndrome: No  Recent physical activity:  Strength training and cardio at gym 4 days/week and 3 days at home; uses 8 lb kettle ball to do standing sit ups @ 5 sets of 10 reps  Progress Towards Goal(s):  In progress.   Nutritional Diagnosis:  [place]-3.3 Overweight/obesity related to past poor dietary habits and physical inactivity as evidenced by patient w/ recent gastric sleeve surgery following dietary guidelines for continued weight loss.    Intervention:  Nutrition education/reinforcement.  Monitoring/Evaluation:  Dietary intake, exercise, and body weight. Follow up in 3 months for 6 month post-op visit.

## 2013-03-26 ENCOUNTER — Encounter (INDEPENDENT_AMBULATORY_CARE_PROVIDER_SITE_OTHER): Payer: Self-pay | Admitting: General Surgery

## 2013-03-26 ENCOUNTER — Ambulatory Visit (INDEPENDENT_AMBULATORY_CARE_PROVIDER_SITE_OTHER): Payer: Federal, State, Local not specified - PPO | Admitting: General Surgery

## 2013-03-26 VITALS — BP 134/86 | HR 81 | Temp 97.8°F | Resp 16 | Ht 70.0 in | Wt 359.4 lb

## 2013-03-26 DIAGNOSIS — K912 Postsurgical malabsorption, not elsewhere classified: Secondary | ICD-10-CM

## 2013-03-26 DIAGNOSIS — Z09 Encounter for follow-up examination after completed treatment for conditions other than malignant neoplasm: Secondary | ICD-10-CM

## 2013-03-26 LAB — CBC WITH DIFFERENTIAL/PLATELET
Basophils Absolute: 0 10*3/uL (ref 0.0–0.1)
Basophils Relative: 0 % (ref 0–1)
Eosinophils Absolute: 0.1 10*3/uL (ref 0.0–0.7)
Eosinophils Relative: 4 % (ref 0–5)
Lymphs Abs: 1.4 10*3/uL (ref 0.7–4.0)
MCH: 24.5 pg — ABNORMAL LOW (ref 26.0–34.0)
MCHC: 32.7 g/dL (ref 30.0–36.0)
MCV: 74.8 fL — ABNORMAL LOW (ref 78.0–100.0)
Neutrophils Relative %: 47 % (ref 43–77)
Platelets: 236 10*3/uL (ref 150–400)
RBC: 5.03 MIL/uL (ref 4.22–5.81)
RDW: 18.3 % — ABNORMAL HIGH (ref 11.5–15.5)

## 2013-03-26 LAB — COMPREHENSIVE METABOLIC PANEL
ALT: 9 U/L (ref 0–53)
Alkaline Phosphatase: 106 U/L (ref 39–117)
CO2: 29 mEq/L (ref 19–32)
Creat: 0.86 mg/dL (ref 0.50–1.35)
Sodium: 141 mEq/L (ref 135–145)
Total Bilirubin: 0.6 mg/dL (ref 0.3–1.2)
Total Protein: 7 g/dL (ref 6.0–8.3)

## 2013-03-26 LAB — LIPID PANEL
HDL: 41 mg/dL (ref >39)
Total CHOL/HDL Ratio: 3.1 Ratio
VLDL: 10 mg/dL (ref 0–40)

## 2013-03-26 LAB — VITAMIN B12: Vitamin B-12: 509 pg/mL (ref 211–911)

## 2013-03-26 LAB — IRON AND TIBC: Iron: 38 ug/dL — ABNORMAL LOW (ref 42–165)

## 2013-03-26 LAB — FOLATE: Folate: >20 ng/mL

## 2013-03-26 NOTE — Progress Notes (Signed)
Subjective:     Patient ID: Brison Fiumara, male   DOB: 27-Apr-1956, 57 y.o.   MRN: [id number]  HPI This patient follows up today 3 months status post vertical sleeve gastrectomy. He has lost 64 pounds since his procedure and feels well. He says that his arthritis is much better his legs feel "90% better". He is lifting weights frequently and not doing much cardio.  He says that he is eating less than 1000 Harry's per day but did eat a whole chicken sandwich yesterday. He says it was "a struggle."  He is taking his protein supplements and vitamin supplements and his bowels are functioning and he denies any abdominal pain. Has no complaints.  Review of Systems     Objective:   Physical Exam NAD, nontoxic Abdomen soft, NT    Assessment:     Status post vertical sleeve gastrectomy-doing well He is doing very well from this procedure although he has had some episodes of stagnation with his weight loss. His doing resistance training and not doing much cardio and I recommended that he increased the cardio aspect of his training.  I also recommended that he continue with less than 1000 calories per day. Thank you for leaving a little too much and eating more for closure still than for satisfaction.    Plan:     Decreased caloric intake and increase cardio and continue with his exercise regimen. We will check some nutrition labs and I will see him back in another 3 months

## 2013-03-31 LAB — VITAMIN B1: Vitamin B1 (Thiamine): 16 nmol/L (ref 8–30)

## 2013-06-03 ENCOUNTER — Ambulatory Visit: Payer: Federal, State, Local not specified - PPO | Admitting: *Deleted

## 2013-12-27 ENCOUNTER — Encounter (HOSPITAL_COMMUNITY): Payer: Self-pay | Admitting: Emergency Medicine

## 2013-12-27 ENCOUNTER — Inpatient Hospital Stay (HOSPITAL_COMMUNITY)
Admission: EM | Admit: 2013-12-27 | Discharge: 2013-12-30 | DRG: 872 | Disposition: A | Discharge status: Home / Self Care | Payer: Federal, State, Local not specified - PPO | Attending: Internal Medicine | Admitting: Internal Medicine

## 2013-12-27 ENCOUNTER — Emergency Department (HOSPITAL_COMMUNITY): Payer: Federal, State, Local not specified - PPO

## 2013-12-27 DIAGNOSIS — R55 Syncope and collapse: Secondary | ICD-10-CM

## 2013-12-27 DIAGNOSIS — N390 Urinary tract infection, site not specified: Secondary | ICD-10-CM

## 2013-12-27 DIAGNOSIS — I10 Essential (primary) hypertension: Secondary | ICD-10-CM | POA: Diagnosis present

## 2013-12-27 DIAGNOSIS — N1330 Unspecified hydronephrosis: Secondary | ICD-10-CM | POA: Diagnosis present

## 2013-12-27 DIAGNOSIS — E872 Acidosis, unspecified: Secondary | ICD-10-CM

## 2013-12-27 DIAGNOSIS — Z87891 Personal history of nicotine dependence: Secondary | ICD-10-CM

## 2013-12-27 DIAGNOSIS — N10 Acute tubulo-interstitial nephritis: Secondary | ICD-10-CM

## 2013-12-27 DIAGNOSIS — R6520 Severe sepsis without septic shock: Secondary | ICD-10-CM

## 2013-12-27 DIAGNOSIS — Z6841 Body Mass Index (BMI) 40.0 and over, adult: Secondary | ICD-10-CM

## 2013-12-27 DIAGNOSIS — N132 Hydronephrosis with renal and ureteral calculous obstruction: Secondary | ICD-10-CM

## 2013-12-27 DIAGNOSIS — I959 Hypotension, unspecified: Secondary | ICD-10-CM | POA: Diagnosis present

## 2013-12-27 DIAGNOSIS — A419 Sepsis, unspecified organism: Principal | ICD-10-CM | POA: Diagnosis present

## 2013-12-27 DIAGNOSIS — Z888 Allergy status to other drugs, medicaments and biological substances status: Secondary | ICD-10-CM

## 2013-12-27 DIAGNOSIS — N201 Calculus of ureter: Secondary | ICD-10-CM

## 2013-12-27 DIAGNOSIS — G4733 Obstructive sleep apnea (adult) (pediatric): Secondary | ICD-10-CM | POA: Diagnosis present

## 2013-12-27 DIAGNOSIS — M129 Arthropathy, unspecified: Secondary | ICD-10-CM | POA: Diagnosis present

## 2013-12-27 DIAGNOSIS — N179 Acute kidney failure, unspecified: Secondary | ICD-10-CM

## 2013-12-27 DIAGNOSIS — Z79899 Other long term (current) drug therapy: Secondary | ICD-10-CM

## 2013-12-27 DIAGNOSIS — N200 Calculus of kidney: Secondary | ICD-10-CM | POA: Diagnosis present

## 2013-12-27 DIAGNOSIS — K219 Gastro-esophageal reflux disease without esophagitis: Secondary | ICD-10-CM | POA: Diagnosis present

## 2013-12-27 LAB — URINE MICROSCOPIC-ADD ON

## 2013-12-27 LAB — HEPATIC FUNCTION PANEL
ALT: 18 U/L (ref 0–53)
AST: 31 U/L (ref 0–37)
Albumin: 3.5 g/dL (ref 3.5–5.2)
Alkaline Phosphatase: 211 U/L — ABNORMAL HIGH (ref 39–117)
BILIRUBIN TOTAL: 0.9 mg/dL (ref 0.3–1.2)
Bilirubin, Direct: 0.3 mg/dL (ref 0.0–0.3)
Indirect Bilirubin: 0.6 mg/dL (ref 0.3–0.9)
Total Protein: 8.2 g/dL (ref 6.0–8.3)

## 2013-12-27 LAB — URINALYSIS, ROUTINE W REFLEX MICROSCOPIC
Glucose, UA: NEGATIVE mg/dL
Ketones, ur: NEGATIVE mg/dL
NITRITE: NEGATIVE
PH: 5 (ref 5.0–8.0)
PROTEIN: 100 mg/dL — AB
Specific Gravity, Urine: 1.025 (ref 1.005–1.030)
Urobilinogen, UA: 0.2 mg/dL (ref 0.0–1.0)

## 2013-12-27 LAB — BASIC METABOLIC PANEL
BUN: 27 mg/dL — AB (ref 6–23)
CALCIUM: 9.3 mg/dL (ref 8.4–10.5)
CHLORIDE: 99 meq/L (ref 96–112)
CO2: 19 mEq/L (ref 19–32)
Creatinine, Ser: 2.72 mg/dL — ABNORMAL HIGH (ref 0.50–1.35)
GFR calc non Af Amer: 25 mL/min — ABNORMAL LOW (ref >90)
GFR, EST AFRICAN AMERICAN: 28 mL/min — AB (ref >90)
Glucose, Bld: 131 mg/dL — ABNORMAL HIGH (ref 70–99)
Potassium: 3.3 mEq/L — ABNORMAL LOW (ref 3.7–5.3)
Sodium: 138 mEq/L (ref 137–147)

## 2013-12-27 LAB — I-STAT CG4 LACTIC ACID, ED: Lactic Acid, Venous: 3.88 mmol/L — ABNORMAL HIGH (ref 0.5–2.2)

## 2013-12-27 LAB — CBC
HCT: 37.7 % — ABNORMAL LOW (ref 39.0–52.0)
Hemoglobin: 13 g/dL (ref 13.0–17.0)
MCH: 27.3 pg (ref 26.0–34.0)
MCHC: 34.5 g/dL (ref 30.0–36.0)
MCV: 79.2 fL (ref 78.0–100.0)
PLATELETS: 106 10*3/uL — AB (ref 150–400)
RBC: 4.76 MIL/uL (ref 4.22–5.81)
RDW: 14.8 % (ref 11.5–15.5)
WBC: 9 10*3/uL (ref 4.0–10.5)

## 2013-12-27 LAB — PRO B NATRIURETIC PEPTIDE: Pro B Natriuretic peptide (BNP): 1097 pg/mL — ABNORMAL HIGH (ref 0–125)

## 2013-12-27 LAB — CBG MONITORING, ED: GLUCOSE-CAPILLARY: 123 mg/dL — AB (ref 70–99)

## 2013-12-27 LAB — I-STAT TROPONIN, ED: TROPONIN I, POC: 0.05 ng/mL (ref 0.00–0.08)

## 2013-12-27 MED ORDER — IOHEXOL 300 MG/ML  SOLN: 50.0000 mL | Freq: Once | INTRAMUSCULAR | Status: AC | PRN

## 2013-12-27 MED ORDER — CEFTRIAXONE SODIUM 1 G IJ SOLR
1.0000 g | Freq: Once | INTRAMUSCULAR | Status: AC
  Administered 2013-12-27: 1 g via INTRAVENOUS
  Filled 2013-12-27: qty 10

## 2013-12-27 MED ORDER — HEPARIN SODIUM (PORCINE) 5000 UNIT/ML IJ SOLN
5000.0000 [IU] | Freq: Three times a day (TID) | INTRAMUSCULAR | Status: DC
  Administered 2013-12-28 – 2013-12-30 (×6): 5000 [IU] via SUBCUTANEOUS
  Filled 2013-12-27 (×11): qty 1

## 2013-12-27 MED ORDER — SODIUM CHLORIDE 0.9 % IV BOLUS (SEPSIS)
1000.0000 mL | Freq: Once | INTRAVENOUS | Status: AC
  Administered 2013-12-27: 1000 mL via INTRAVENOUS

## 2013-12-27 MED ORDER — ACETAMINOPHEN 500 MG PO TABS
1000.0000 mg | ORAL_TABLET | Freq: Four times a day (QID) | ORAL | Status: DC | PRN
  Administered 2013-12-28 – 2013-12-30 (×5): 1000 mg via ORAL
  Filled 2013-12-27 (×5): qty 2

## 2013-12-27 MED ORDER — TECHNETIUM TO 99M ALBUMIN AGGREGATED
5.5000 | Freq: Once | INTRAVENOUS | Status: AC | PRN
  Administered 2013-12-27: 5.5 via INTRAVENOUS

## 2013-12-27 MED ORDER — SODIUM CHLORIDE 0.9 % IJ SOLN: 3.0000 mL | Freq: Two times a day (BID) | INTRAMUSCULAR | Status: DC

## 2013-12-27 MED ORDER — TECHNETIUM TC 99M DIETHYLENETRIAME-PENTAACETIC ACID: 38.9000 | Freq: Once | INTRAVENOUS | Status: AC | PRN

## 2013-12-27 MED ORDER — SODIUM CHLORIDE 0.9 % IV SOLN
INTRAVENOUS | Status: DC
  Administered 2013-12-28 (×3): via INTRAVENOUS

## 2013-12-27 MED ORDER — DEXTROSE 5 % IV SOLN
1.0000 g | INTRAVENOUS | Status: DC
  Administered 2013-12-28 – 2013-12-29 (×2): 1 g via INTRAVENOUS
  Filled 2013-12-27 (×5): qty 10

## 2013-12-27 NOTE — ED Notes (Signed)
Pt states he has had sob, chills and dizziness since Saturday. Pt states it started suddenly at noon on Saturday. Pt denies any hx of heart/lung problems. Pt states he has passed out 3 times since then. Pt also complain of abd pain. No n/v/d. Pt hypotensive.

## 2013-12-27 NOTE — ED Notes (Signed)
Pt off the floor for lung scan.

## 2013-12-27 NOTE — ED Notes (Signed)
Pt ret from lung scan at this time, VS obtained.  Dr. Betsey Holiday aware.  Pt reports dec pain, denies other complaints at this time.  Skin PWD.  Self repositioning for comfort.  NAD.

## 2013-12-27 NOTE — H&P (Signed)
Urology History and Physical Exam  CC: Left ureter stone. SIRS.  HPI: 58 year old male presents to the ER with a 3 day history of fever to 101, and abdominal and left flank pain. CT scan tonight reveals bilateral nephrolithiasis. He is a left obstructing ureter stone. This is associated with stranding of the left kidney. He does not have leukocytosis, but he has an elevated lactic acid to 3.88. He never had kidney stones before. Nothing makes it better or worse. I've explained the patient that he needs systemic inflammatory response criteria. He is admitted to the hospital service. I've explained to the patient that I would recommend drainage of his kidney. This can be done with cystoscopy and retrograde ureter stent placement versus her kidneys nephrostolithotomy. We discussed how performed. We discussed risk and benefits, side effects. I recommend proceeding to the operating room for cystoscopy, left ureter stent placement, possible left retrograde pyelogram. We discussed the risk and benefits, side effects as well as likelihood of achieving goals, which include but are not limited to, bleeding, infection, allergic reaction, heart attack, stroke, death, inability to place stent, stent discomfort, and pain. I explained to him this would not treat the stone and that we will have returned a later time to treat the stone with his infection is cleared.  PMH: Past Medical History  Diagnosis Date  . Anemia   . Arthritis   . Spinal stenosis   . Obstructive sleep apnea   . Morbid obesity   . Hypertension   . GERD (gastroesophageal reflux disease)   . Prurigo 2002    PSH: Past Surgical History  Procedure Laterality Date  . Back surgery  01/23/2010    lumbar  . Laparoscopic gastric sleeve resection N/A 12/07/2012    Procedure: LAPAROSCOPIC GASTRIC SLEEVE RESECTION;  Surgeon: Lodema PilotBrian Layton, DO;  Location: [facility];  Service: General;  Laterality: N/A;  laparoscopic sleeve gastrectomy with EGD  .  Esophagogastroduodenoscopy N/A 12/07/2012    Procedure: ESOPHAGOGASTRODUODENOSCOPY (EGD);  Surgeon: Lodema PilotBrian Layton, DO;  Location: [facility];  Service: General;  Laterality: N/A;    Allergies: Allergies  Allergen Reactions  . Unisom [Doxylamine Succinate (Sleep)] Hives    Medications:  (Not in a hospital admission)   Social History: History   Social History  . Marital Status: Married    Spouse Name: N/A    Number of Children: N/A  . Years of Education: N/A   Occupational History  . Not on file.   Social History Main Topics  . Smoking status: Former Smoker    Types: Cigarettes    Quit date: 04/09/1974  . Smokeless tobacco: Never Used  . Alcohol Use: No  . Drug Use: Yes    Special: Marijuana  . Sexual Activity: Not on file   Other Topics Concern  . Not on file   Social History Narrative  . No narrative on file    Family History: History reviewed. No pertinent family history.  Review of Systems: Positive: Sweats, fever, abdominal pain, chills. Negative: Nausea, chest pain, itching.  A further 10 point review of systems was negative except what is listed in the HPI.  Physical Exam: Filed Vitals:   12/27/13 2330  BP: 81/48  Pulse:   Temp:   Resp: 15    General: No acute distress.  Awake. Head:  Normocephalic.  Atraumatic. ENT:  EOMI.  Mucous membranes moist Neck:  Supple.  No lymphadenopathy. CV:  S1 present. S2 present. Regular rate. Pulmonary: Equal effort bilaterally.  Clear  to auscultation bilaterally. Abdomen: Soft.  Non- tender to palpation. Skin:  Normal turgor.  No visible rash. Extremity: No gross deformity of bilateral upper extremities.  No gross deformity of    bilateral lower extremities. Neurologic: Alert. Appropriate mood.    Studies:  Recent Labs     12/27/13  1715  HGB  13.0  WBC  9.0  PLT  106*    Recent Labs     12/27/13  1552  NA  138  K  3.3*  CL  99  CO2  19  BUN  27*  CREATININE  2.72*  CALCIUM  9.3  GFRNONAA   25*  GFRAA  28*     No results found for this basename: PT, INR, APTT,  in the last 72 hours   No components found with this basename: ABG,     Assessment:  Left ureter stone. SIRS.  Plan: Admit to [facility] hospitalist.  Cipro IV on call to OR.  I have notified the OR emergently.  To OR for cystoscopy, left ureter stent placement, possible left retrograde pyelogram.

## 2013-12-27 NOTE — ED Notes (Signed)
Pt c/o 10/10 diffuse lower abd pain, Dr. Blinda LeatherwoodPollina made aware, no further orders at this time.  NAD.

## 2013-12-27 NOTE — ED Notes (Signed)
Pt aware of need for urine spec, sts unable to provide at this time.  

## 2013-12-27 NOTE — ED Provider Notes (Addendum)
CSN: [id number]     Arrival date & time 12/27/13  1527 History   First MD Initiated Contact with Patient 12/27/13 1555     Chief Complaint  Patient presents with  . Shortness of Breath  . Loss of Consciousness     (Consider location/radiation/quality/duration/timing/severity/associated sxs/prior Treatment) HPI Comments: Patient brought to the emergency department for evaluation of weakness and syncope. Patient reports that he has been sick for 2 days. He has been experiencing chills, sweats, generalized weakness. He has been feeling dizzy. Patient reports that he has passed out 3 times. He has been experiencing abdominal pain. He had nausea and vomiting 2 days ago, none since. He has not had diarrhea. Patient does endorse shortness of breath but denies chest pain.  Patient is a 58 y.o. male presenting with shortness of breath and syncope.  Shortness of Breath Associated symptoms: abdominal pain, diaphoresis, syncope and vomiting   Associated symptoms: no chest pain   Loss of Consciousness Associated symptoms: diaphoresis, nausea, shortness of breath and vomiting   Associated symptoms: no chest pain     Past Medical History  Diagnosis Date  . Anemia   . Arthritis   . Spinal stenosis   . Obstructive sleep apnea   . Morbid obesity   . Hypertension   . GERD (gastroesophageal reflux disease)   . Prurigo 2002   Past Surgical History  Procedure Laterality Date  . Back surgery  01/23/2010    lumbar  . Laparoscopic gastric sleeve resection N/A 12/07/2012    Procedure: LAPAROSCOPIC GASTRIC SLEEVE RESECTION;  Surgeon: Lodema Pilot, DO;  Location: [facility];  Service: General;  Laterality: N/A;  laparoscopic sleeve gastrectomy with EGD  . Esophagogastroduodenoscopy N/A 12/07/2012    Procedure: ESOPHAGOGASTRODUODENOSCOPY (EGD);  Surgeon: Lodema Pilot, DO;  Location: [facility];  Service: General;  Laterality: N/A;   History reviewed. No pertinent family history. History  Substance Use Topics  .  Smoking status: Former Smoker    Types: Cigarettes    Quit date: 04/09/1974  . Smokeless tobacco: Never Used  . Alcohol Use: No    Review of Systems  Constitutional: Positive for chills and diaphoresis.  Respiratory: Positive for shortness of breath.   Cardiovascular: Positive for syncope. Negative for chest pain.  Gastrointestinal: Positive for nausea, vomiting and abdominal pain.  Neurological: Positive for syncope.  All other systems reviewed and are negative.      Allergies  Unisom  Home Medications   Current Outpatient Rx  Name  Route  Sig  Dispense  Refill  . acetaminophen (TYLENOL) 500 MG tablet   Oral   Take 1,000 mg by mouth every 6 (six) hours as needed for mild pain or headache.         . calcium citrate-vitamin D (CITRACAL+D) 315-200 MG-UNIT per tablet   Oral   Take 1 tablet by mouth 2 (two) times daily.          . Cyanocobalamin (NASCOBAL) 500 MCG/0.1ML SOLN   Right Nare   Place 500 mcg into right nostril every Saturday.         . Multiple Vitamin (MULTIVITAMIN WITH MINERALS) TABS   Oral   Take 1 tablet by mouth 2 (two) times daily.          Marland Kitchen oxyCODONE (OXY IR/ROXICODONE) 5 MG immediate release tablet   Oral   Take 5 mg by mouth every 6 (six) hours as needed for severe pain.          . vitamin B-12 (  CYANOCOBALAMIN) 1000 MCG tablet   Oral   Take 1,000 mcg by mouth daily.          BP 81/48  Pulse 87  Temp(Src) 98.8 F (37.1 C) (Oral)  Resp 15  SpO2 98% Physical Exam  Constitutional: He is oriented to person, place, and time. He appears well-developed and well-nourished. He appears distressed.  HENT:  Head: Normocephalic and atraumatic.  Right Ear: Hearing normal.  Left Ear: Hearing normal.  Nose: Nose normal.  Mouth/Throat: Oropharynx is clear and moist and mucous membranes are normal.  Eyes: Conjunctivae and EOM are normal. Pupils are equal, round, and reactive to light.  Neck: Normal range of motion. Neck supple.   Cardiovascular: Regular rhythm, S1 normal and S2 normal.  Exam reveals no gallop and no friction rub.   No murmur heard. Pulmonary/Chest: Effort normal and breath sounds normal. No respiratory distress. He exhibits no tenderness.  Abdominal: Soft. Normal appearance and bowel sounds are normal. There is no hepatosplenomegaly. There is no tenderness. There is no rebound, no guarding, no tenderness at McBurney's point and negative Murphy's sign. No hernia.  Musculoskeletal: Normal range of motion.  Neurological: He is alert and oriented to person, place, and time. He has normal strength. No cranial nerve deficit or sensory deficit. Coordination normal. GCS eye subscore is 4. GCS verbal subscore is 5. GCS motor subscore is 6.  Skin: Skin is warm, dry and intact. No rash noted. No cyanosis.  Psychiatric: He has a normal mood and affect. His speech is normal and behavior is normal. Thought content normal.    ED Course  Procedures (including critical care time) Labs Review Labs Reviewed  PRO B NATRIURETIC PEPTIDE - Abnormal; Notable for the following:    Pro B Natriuretic peptide (BNP) 1097.0 (*)    All other components within normal limits  BASIC METABOLIC PANEL - Abnormal; Notable for the following:    Potassium 3.3 (*)    Glucose, Bld 131 (*)    BUN 27 (*)    Creatinine, Ser 2.72 (*)    GFR calc non Af Amer 25 (*)    GFR calc Af Amer 28 (*)    All other components within normal limits  HEPATIC FUNCTION PANEL - Abnormal; Notable for the following:    Alkaline Phosphatase 211 (*)    All other components within normal limits  CBC - Abnormal; Notable for the following:    HCT 37.7 (*)    Platelets 106 (*)    All other components within normal limits  URINALYSIS, ROUTINE W REFLEX MICROSCOPIC - Abnormal; Notable for the following:    Color, Urine AMBER (*)    APPearance TURBID (*)    Hgb urine dipstick LARGE (*)    Bilirubin Urine SMALL (*)    Protein, ur 100 (*)    Leukocytes, UA  MODERATE (*)    All other components within normal limits  URINE MICROSCOPIC-ADD ON - Abnormal; Notable for the following:    Bacteria, UA MANY (*)    Casts GRANULAR CAST (*)    All other components within normal limits  CBG MONITORING, ED - Abnormal; Notable for the following:    Glucose-Capillary 123 (*)    All other components within normal limits  I-STAT CG4 LACTIC ACID, ED - Abnormal; Notable for the following:    Lactic Acid, Venous 3.88 (*)    All other components within normal limits  I-STAT TROPOININ, ED   Imaging Review Ct Abdomen Pelvis Wo Contrast  12/27/2013  CLINICAL DATA:  Pain.  Hypotension.  EXAM: CT ABDOMEN AND PELVIS WITHOUT CONTRAST  TECHNIQUE: Multidetector CT imaging of the abdomen and pelvis was performed following the standard protocol without intravenous contrast.  COMPARISON:  DG ABD ACUTE W/CHEST dated 12/13/2012  FINDINGS: Liver normal. Loops spleen normal. The pancreas normal. No biliary distention. Gallbladder sludge cannot be excluded. No gallbladder distention or gallbladder wall thickening. No pericholecystic fluid collections.  Mild right adrenal fullness consistent with adrenal hyperplasia. Nonobstructive right nephrolithiasis. 6 mm stone within the proximal left ureter with mild hydronephrosis. Nonobstructive left nephrolithiasis. Simple cyst left kidney. Left kidney is swollen. This could be from hydronephrosis and/or pyelonephritis. Perinephric fluid is present. The bladder is contracted. Bladder wall thickening is most likely secondary to contraction, bladder pathology cannot be excluded. Prostate is not enlarged. No pathologic pelvic fluid collections.  Shotty inguinal lymph nodes.  Aorta normal caliber.  Appendix normal. No bowel distention. No free air. Small midline hernia with herniation of fat. Mild soft tissue prominence in this region, most likely related to scarring.  Heart size normal. Mild pericardial thickening versus tiny effusion noted. Small  pleural effusions. Bibasilar atelectasis. Severe degenerative changes lumbar spine.  IMPRESSION: 1. 6 mm stone proximal left ureter with mild hydronephrosis. Prominent left renal swelling is present. This could be secondary to the hydronephrosis or process such as pyelonephritis.  2. Bilateral nonobstructive nephrolithiasis. 3. Right adrenal hyperplasia. 4. Mild pericardial thickening versus tiny effusion.   Electronically Signed   By: Maisie Fus  Register   On: 12/27/2013 18:44   Nm Pulmonary Perf And Vent  12/27/2013   CLINICAL DATA:  Shortness of breath and syncope.  EXAM: NUCLEAR MEDICINE VENTILATION - PERFUSION LUNG SCAN  TECHNIQUE: Ventilation images were obtained in multiple projections using inhaled aerosol technetium 99 M DTPA. Perfusion images were obtained in multiple projections after intravenous injection of Tc-89m MAA.  RADIOPHARMACEUTICALS:  38.9 mCi Tc-63m DTPA aerosol and 5.5 mCi Tc-30m MAA  COMPARISON:  12/27/2013  FINDINGS: Ventilation: No focal ventilation defect.  Perfusion: No wedge shaped peripheral perfusion defects to suggest acute pulmonary embolism.  IMPRESSION: Very low probability for acute pulmonary embolus.   Electronically Signed   By: Signa Kell M.D.   On: 12/27/2013 19:25   Dg Chest Port 1 View  12/27/2013   CLINICAL DATA:  Shortness of breath and chest pain  EXAM: PORTABLE CHEST - 1 VIEW  COMPARISON:  12/13/2012  FINDINGS: Cardiac shadow is stable. The lungs are clear bilaterally. No acute bony abnormality is seen.  IMPRESSION: No acute abnormality noted.   Electronically Signed   By: Alcide Clever M.D.   On: 12/27/2013 16:29     EKG Interpretation None      Date: 12/27/2013  Rate: 102  Rhythm: sinus tachycardia  QRS Axis: normal  Intervals: normal  ST/T Wave abnormalities: normal  Conduction Disutrbances:none  Narrative Interpretation:   Old EKG Reviewed: unchanged    MDM   Final diagnoses:  Ureterolithiasis  UTI (lower urinary tract infection)   Hypotension  AKI (acute kidney injury)  Syncope  Sepsis    Patient presented to the ER with complaints of chills, sweats, dizziness and passing out. He has been feeling ill for the last 2 days. Patient has now noticed that he has been having diffuse pain in the abdomen and has also noted shortness of breath. Arrival to the ER, patient was in great distress. He was diaphoretic and hypotensive. It was initially difficult to identify the cause of all of the patient's  symptoms. He did not have an abdominal exam that would support peritonitis. With his syncope, hypotension and shortness of breath, PE was considered. Acute abdominal process was still considered based on the patient's abdominal pain. He underwent VQ scan and an abdomen CT. VQ scan was low probability for PE. Abdominal CT dated show left ureteral stone with hydronephrosis which would explain the patient's abdominal pain. This did not, however, explain his hypotension. Patient appears to be dehydrated with acute kidney injury. BUN and creatinine are significantly elevated over baseline. It took a while to get a urinalysis, but this is consistent with infection. This is likely the source of the patient's sepsis.  Based on the concern for urinary infection, possible pyelonephritis behind a ureteral stone, urology consult placed. Doctor Margarita GrizzleWoodruff will take the patient to the operating room.  Patient also discussed with Doctor Julian ReilGardner, on-call for hospitalist service. He'll admit the patient for ongoing management.  CRITICAL CARE Performed by: Gilda CreasePOLLINA, Vito Beg J.   Total critical care time: 30min  Critical care time was exclusive of separately billable procedures and treating other patients.  Critical care was necessary to treat or prevent imminent or life-threatening deterioration.  Critical care was time spent personally by me on the following activities: development of treatment plan with patient and/or surrogate as well as nursing,  discussions with consultants, evaluation of patient's response to treatment, examination of patient, obtaining history from patient or surrogate, ordering and performing treatments and interventions, ordering and review of laboratory studies, ordering and review of radiographic studies, pulse oximetry and re-evaluation of patient's condition.   Gilda Creasehristopher J. Chauncey Bruno, MD 12/27/13 [id number]  Gilda Creasehristopher J. Argenis Kumari, MD 12/28/13 [phone]

## 2013-12-27 NOTE — ED Notes (Signed)
Pt given crackers and gingerale with verbal ok Dr. Blinda LeatherwoodPollina.  Tolerating well at this time.

## 2013-12-27 NOTE — H&P (Signed)
[facility] History and Physical  [name] [name] BOF:[id number] DOB: 21-Jun-1956 DOA: 12/27/2013  Referring physician: EDP PCP: Katy Apo, MD   Chief Complaint: SOB, LOC   HPI: [name] [name] is a 57 y.o. male who presents to the ED for evaluation of generalized weakness and syncope.  Patient reports that he has been feeling ill for the past 2 days.  He describes, chills, sweats, generalized weakness.  3 episodes of syncope at home.  He has had abdominal pain with N/V 2 days ago but none since.  No diarrhea, no chest pain, has had SOB.  Work up in the ED demonstrates severe sepsis, borderline shock, with hypotension that has marginally improved with 2L IVF but patient MAP still below 65.  UTI as seen on UA, and an obstructing L ureteral stone with hydronephrosis appear to be the most likely culprit.  Review of Systems: Systems reviewed.  As above, otherwise negative  Past Medical History  Diagnosis Date  . Anemia   . Arthritis   . Spinal stenosis   . Obstructive sleep apnea   . Morbid obesity   . Hypertension   . GERD (gastroesophageal reflux disease)   . Prurigo 2002   Past Surgical History  Procedure Laterality Date  . Back surgery  01/23/2010    lumbar  . Laparoscopic gastric sleeve resection N/A 12/07/2012    Procedure: LAPAROSCOPIC GASTRIC SLEEVE RESECTION;  Surgeon: Lodema Pilot, DO;  Location: [facility];  Service: General;  Laterality: N/A;  laparoscopic sleeve gastrectomy with EGD  . Esophagogastroduodenoscopy N/A 12/07/2012    Procedure: ESOPHAGOGASTRODUODENOSCOPY (EGD);  Surgeon: Lodema Pilot, DO;  Location: [facility];  Service: General;  Laterality: N/A;   Social History:  reports that he quit smoking about 39 years ago. His smoking use included Cigarettes. He smoked 0.00 packs per day. He has never used smokeless tobacco. He reports that he uses illicit drugs (Marijuana). He reports that he does not drink alcohol.  Allergies  Allergen Reactions  . Unisom [Doxylamine  Succinate (Sleep)] Hives    History reviewed. No pertinent family history.   Prior to Admission medications   Medication Sig Start Date End Date Taking? Authorizing Provider  acetaminophen (TYLENOL) 500 MG tablet Take 1,000 mg by mouth every 6 (six) hours as needed for mild pain or headache.   Yes Historical Provider, MD  calcium citrate-vitamin D (CITRACAL+D) 315-200 MG-UNIT per tablet Take 1 tablet by mouth 2 (two) times daily.    Yes Historical Provider, MD  Cyanocobalamin (NASCOBAL) 500 MCG/0.1ML SOLN Place 500 mcg into right nostril every Saturday.   Yes Historical Provider, MD  Multiple Vitamin (MULTIVITAMIN WITH MINERALS) TABS Take 1 tablet by mouth 2 (two) times daily.    Yes Historical Provider, MD  oxyCODONE (OXY IR/ROXICODONE) 5 MG immediate release tablet Take 5 mg by mouth every 6 (six) hours as needed for severe pain.  11/05/13  Yes Historical Provider, MD  vitamin B-12 (CYANOCOBALAMIN) 1000 MCG tablet Take 1,000 mcg by mouth daily.   Yes Historical Provider, MD   Physical Exam: Filed Vitals:   12/27/13 2330  BP: 81/48  Pulse:   Temp:   Resp: 15    BP 81/48  Pulse 87  Temp(Src) 98.8 F (37.1 C) (Oral)  Resp 15  SpO2 98%  General Appearance:    Alert, oriented, no distress, appears stated age  Head:    Normocephalic, atraumatic  Eyes:    PERRL, EOMI, sclera non-icteric        Nose:   Nares  without drainage or epistaxis. Mucosa, turbinates normal  Throat:   Moist mucous membranes. Oropharynx without erythema or exudate.  Neck:   Supple. No carotid bruits.  No thyromegaly.  No lymphadenopathy.   Back:     No CVA tenderness, no spinal tenderness  Lungs:     Clear to auscultation bilaterally, without wheezes, rhonchi or rales  Chest wall:    No tenderness to palpitation  Heart:    Regular rate and rhythm without murmurs, gallops, rubs  Abdomen:     Soft, non-tender, nondistended, normal bowel sounds, no organomegaly  Genitalia:    deferred  Rectal:    deferred   Extremities:   No clubbing, cyanosis or edema.  Pulses:   2+ and symmetric all extremities  Skin:   Skin color, texture, turgor normal, no rashes or lesions  Lymph nodes:   Cervical, supraclavicular, and axillary nodes normal  Neurologic:   CNII-XII intact. Normal strength, sensation and reflexes      throughout    Labs on Admission:  Basic Metabolic Panel:  Recent Labs Lab 12/27/13 1552  NA 138  K 3.3*  CL 99  CO2 19  GLUCOSE 131*  BUN 27*  CREATININE 2.72*  CALCIUM 9.3   Liver Function Tests:  Recent Labs Lab 12/27/13 1552  AST 31  ALT 18  ALKPHOS 211*  BILITOT 0.9  PROT 8.2  ALBUMIN 3.5   No results found for this basename: LIPASE, AMYLASE,  in the last 168 hours No results found for this basename: AMMONIA,  in the last 168 hours CBC:  Recent Labs Lab 12/27/13 1715  WBC 9.0  HGB 13.0  HCT 37.7*  MCV 79.2  PLT 106*   Cardiac Enzymes: No results found for this basename: CKTOTAL, CKMB, CKMBINDEX, TROPONINI,  in the last 168 hours  BNP (last 3 results)  Recent Labs  12/27/13 1552  PROBNP 1097.0*   CBG:  Recent Labs Lab 12/27/13 1549  GLUCAP 123*    Radiological Exams on Admission: Ct Abdomen Pelvis Wo Contrast  12/27/2013   CLINICAL DATA:  Pain.  Hypotension.  EXAM: CT ABDOMEN AND PELVIS WITHOUT CONTRAST  TECHNIQUE: Multidetector CT imaging of the abdomen and pelvis was performed following the standard protocol without intravenous contrast.  COMPARISON:  DG ABD ACUTE W/CHEST dated 12/13/2012  FINDINGS: Liver normal. Loops spleen normal. The pancreas normal. No biliary distention. Gallbladder sludge cannot be excluded. No gallbladder distention or gallbladder wall thickening. No pericholecystic fluid collections.  Mild right adrenal fullness consistent with adrenal hyperplasia. Nonobstructive right nephrolithiasis. 6 mm stone within the proximal left ureter with mild hydronephrosis. Nonobstructive left nephrolithiasis. Simple cyst left kidney. Left  kidney is swollen. This could be from hydronephrosis and/or pyelonephritis. Perinephric fluid is present. The bladder is contracted. Bladder wall thickening is most likely secondary to contraction, bladder pathology cannot be excluded. Prostate is not enlarged. No pathologic pelvic fluid collections.  Shotty inguinal lymph nodes.  Aorta normal caliber.  Appendix normal. No bowel distention. No free air. Small midline hernia with herniation of fat. Mild soft tissue prominence in this region, most likely related to scarring.  Heart size normal. Mild pericardial thickening versus tiny effusion noted. Small pleural effusions. Bibasilar atelectasis. Severe degenerative changes lumbar spine.  IMPRESSION: 1. 6 mm stone proximal left ureter with mild hydronephrosis. Prominent left renal swelling is present. This could be secondary to the hydronephrosis or process such as pyelonephritis.  2. Bilateral nonobstructive nephrolithiasis. 3. Right adrenal hyperplasia. 4. Mild pericardial thickening versus tiny effusion.  Electronically Signed   By: Maisie Fus  Register   On: 12/27/2013 18:44   Nm Pulmonary Perf And Vent  12/27/2013   CLINICAL DATA:  Shortness of breath and syncope.  EXAM: NUCLEAR MEDICINE VENTILATION - PERFUSION LUNG SCAN  TECHNIQUE: Ventilation images were obtained in multiple projections using inhaled aerosol technetium 99 M DTPA. Perfusion images were obtained in multiple projections after intravenous injection of Tc-53m MAA.  RADIOPHARMACEUTICALS:  38.9 mCi Tc-14m DTPA aerosol and 5.5 mCi Tc-50m MAA  COMPARISON:  12/27/2013  FINDINGS: Ventilation: No focal ventilation defect.  Perfusion: No wedge shaped peripheral perfusion defects to suggest acute pulmonary embolism.  IMPRESSION: Very low probability for acute pulmonary embolus.   Electronically Signed   By: Signa Kell M.D.   On: 12/27/2013 19:25   Dg Chest Port 1 View  12/27/2013   CLINICAL DATA:  Shortness of breath and chest pain  EXAM: PORTABLE  CHEST - 1 VIEW  COMPARISON:  12/13/2012  FINDINGS: Cardiac shadow is stable. The lungs are clear bilaterally. No acute bony abnormality is seen.  IMPRESSION: No acute abnormality noted.   Electronically Signed   By: Alcide Clever M.D.   On: 12/27/2013 16:29    EKG: Independently reviewed.  Assessment/Plan Principal Problem:   Severe sepsis with acute organ dysfunction Active Problems:   Acute kidney failure   Hypotension   Lactic acidosis   Sepsis secondary to UTI   1. Severe sepsis with acute organ dysfunction - rocephin, 2L IVF so far, 3rd L bolus ordered, marginal improvement in BPs with SBP up to the 80s from 60s on arrival, will continue IVF resuscitation, goal MAP > 65, if unable to achieve after 4L consult PCCM re: vasopressors for ongoing hypotension. 2. UTI with hydronephrosis and L sided stone - apparently the culprit and would explain his abdominal pain which is colicky and non-peritoneal.  Urology is on the way in to evaluate and likely take patient emergently to the OR this evening for ureteral stent placement.  Agree that this is the best course of action in this patient. 3. Lactic acidosis - repeat lactate in AM, then Q6H 4. AKF - likely pre-renal due to sepsis, creatinine 2.7 today with baseline between 0.8-0.9.  Strict intake and output, goal MAP 65, continue IVF resuscitation and again, if after 2 more L bolus goal is not reached then consult PCCM regarding vasopressor recommendations.    Code Status: Full Code  Family Communication: No family in room Disposition Plan: Admit to SDU   Time spent: 70 min  GARDNER, JARED M. [facility] Pager [phone]  If 7AM-7PM, please contact the day team taking care of the patient [email or web address] Password [facility] 12/27/2013, 11:44 PM

## 2013-12-27 NOTE — ED Notes (Signed)
Pt to radiology.

## 2013-12-27 NOTE — ED Notes (Signed)
Initial Contact - pt from triage to RM23 with family, changed to hospital gown.  Pt reports dizziness, chills and feeling SOB x2 days.  Pt denies hx of similar.  Speaking full/clear sentences, rr even/un-lab, lsctab, dim throughout.  Pt denies cough.  A+Ox4.  Neuros grossly intact.  Pt reports intermittent dizziness.  MAEI, skin pale/warm/diaphoretic.  Pt denies cp/palpitations, SR/ST on monitor, no ectopy.

## 2013-12-28 ENCOUNTER — Encounter (HOSPITAL_COMMUNITY)
Admission: EM | Disposition: A | Discharge status: Home / Self Care | Payer: Self-pay | Source: Home / Self Care | Attending: Internal Medicine

## 2013-12-28 ENCOUNTER — Encounter (HOSPITAL_COMMUNITY): Payer: Self-pay | Admitting: Certified Registered"

## 2013-12-28 ENCOUNTER — Inpatient Hospital Stay (HOSPITAL_COMMUNITY): Payer: Federal, State, Local not specified - PPO | Admitting: Certified Registered"

## 2013-12-28 ENCOUNTER — Encounter (HOSPITAL_COMMUNITY): Payer: Federal, State, Local not specified - PPO | Admitting: Certified Registered"

## 2013-12-28 ENCOUNTER — Ambulatory Visit: Admit: 2013-12-28 | Payer: Self-pay | Admitting: Urology

## 2013-12-28 DIAGNOSIS — N132 Hydronephrosis with renal and ureteral calculous obstruction: Secondary | ICD-10-CM

## 2013-12-28 DIAGNOSIS — N10 Acute tubulo-interstitial nephritis: Secondary | ICD-10-CM

## 2013-12-28 HISTORY — PX: CYSTOSCOPY WITH STENT PLACEMENT: SHX5790

## 2013-12-28 LAB — LACTIC ACID, PLASMA
LACTIC ACID, VENOUS: 2.8 mmol/L — AB (ref 0.5–2.2)
LACTIC ACID, VENOUS: 2.3 mmol/L — AB (ref 0.5–2.2)
LACTIC ACID, VENOUS: 2 mmol/L (ref 0.5–2.2)
LACTIC ACID, VENOUS: 1.5 mmol/L (ref 0.5–2.2)

## 2013-12-28 LAB — COMPREHENSIVE METABOLIC PANEL
ALT: 22 U/L (ref 0–53)
AST: 34 U/L (ref 0–37)
Albumin: 2.9 g/dL — ABNORMAL LOW (ref 3.5–5.2)
Alkaline Phosphatase: 92 U/L (ref 39–117)
BILIRUBIN TOTAL: 0.6 mg/dL (ref 0.3–1.2)
BUN: 33 mg/dL — AB (ref 6–23)
CALCIUM: 8.4 mg/dL (ref 8.4–10.5)
CO2: 23 mEq/L (ref 19–32)
CREATININE: 3.41 mg/dL — AB (ref 0.50–1.35)
Chloride: 100 mEq/L (ref 96–112)
GFR calc non Af Amer: 19 mL/min — ABNORMAL LOW (ref >90)
GFR, EST AFRICAN AMERICAN: 22 mL/min — AB (ref >90)
GLUCOSE: 130 mg/dL — AB (ref 70–99)
Potassium: 3.7 mEq/L (ref 3.7–5.3)
Sodium: 138 mEq/L (ref 137–147)
Total Protein: 6.9 g/dL (ref 6.0–8.3)

## 2013-12-28 LAB — CBC
HEMATOCRIT: 38.3 % — AB (ref 39.0–52.0)
Hemoglobin: 12.7 g/dL — ABNORMAL LOW (ref 13.0–17.0)
MCH: 27 pg (ref 26.0–34.0)
MCHC: 33.2 g/dL (ref 30.0–36.0)
MCV: 81.5 fL (ref 78.0–100.0)
PLATELETS: DECREASED 10*3/uL (ref 150–400)
RBC: 4.7 MIL/uL (ref 4.22–5.81)
RDW: 15.4 % (ref 11.5–15.5)
WBC: 18.1 10*3/uL — ABNORMAL HIGH (ref 4.0–10.5)

## 2013-12-28 LAB — MRSA PCR SCREENING: MRSA BY PCR: NEGATIVE

## 2013-12-28 SURGERY — CYSTOSCOPY, WITH STENT INSERTION
Anesthesia: General | Site: Ureter | Laterality: Left

## 2013-12-28 MED ORDER — PROPOFOL 10 MG/ML IV BOLUS
INTRAVENOUS | Status: AC
  Filled 2013-12-28: qty 20

## 2013-12-28 MED ORDER — PROPOFOL 10 MG/ML IV BOLUS
INTRAVENOUS | Status: DC | PRN
  Administered 2013-12-28: 200 mg via INTRAVENOUS

## 2013-12-28 MED ORDER — CIPROFLOXACIN IN D5W 400 MG/200ML IV SOLN
400.0000 mg | INTRAVENOUS | Status: AC
  Administered 2013-12-28: 400 mg via INTRAVENOUS

## 2013-12-28 MED ORDER — MIDAZOLAM HCL 2 MG/2ML IJ SOLN
INTRAMUSCULAR | Status: AC
  Filled 2013-12-28: qty 2

## 2013-12-28 MED ORDER — BELLADONNA ALKALOIDS-OPIUM 16.2-60 MG RE SUPP
RECTAL | Status: DC | PRN
  Administered 2013-12-28: 1 via RECTAL

## 2013-12-28 MED ORDER — BELLADONNA ALKALOIDS-OPIUM 16.2-60 MG RE SUPP
RECTAL | Status: AC
  Filled 2013-12-28: qty 1

## 2013-12-28 MED ORDER — METOCLOPRAMIDE HCL 5 MG/ML IJ SOLN
INTRAMUSCULAR | Status: AC
  Filled 2013-12-28: qty 2

## 2013-12-28 MED ORDER — FENTANYL CITRATE 0.05 MG/ML IJ SOLN
INTRAMUSCULAR | Status: AC
  Filled 2013-12-28: qty 2

## 2013-12-28 MED ORDER — SODIUM CHLORIDE 0.9 % IR SOLN
Status: DC | PRN
  Administered 2013-12-28: 3000 mL

## 2013-12-28 MED ORDER — METOCLOPRAMIDE HCL 5 MG/ML IJ SOLN
INTRAMUSCULAR | Status: DC | PRN
  Administered 2013-12-28: 10 mg via INTRAVENOUS

## 2013-12-28 MED ORDER — ONDANSETRON HCL 4 MG/2ML IJ SOLN
INTRAMUSCULAR | Status: DC | PRN
  Administered 2013-12-28: 4 mg via INTRAVENOUS

## 2013-12-28 MED ORDER — LIDOCAINE HCL 2 % EX GEL
CUTANEOUS | Status: DC | PRN
  Administered 2013-12-28: 1 via URETHRAL

## 2013-12-28 MED ORDER — FENTANYL CITRATE 0.05 MG/ML IJ SOLN: 25.0000 ug | INTRAMUSCULAR | Status: DC | PRN

## 2013-12-28 MED ORDER — CIPROFLOXACIN IN D5W 400 MG/200ML IV SOLN
INTRAVENOUS | Status: AC
  Filled 2013-12-28: qty 200

## 2013-12-28 MED ORDER — IOHEXOL 300 MG/ML  SOLN
INTRAMUSCULAR | Status: DC | PRN
  Administered 2013-12-28: 50 mL

## 2013-12-28 MED ORDER — FENTANYL CITRATE 0.05 MG/ML IJ SOLN
INTRAMUSCULAR | Status: DC | PRN
  Administered 2013-12-28 (×2): 50 ug via INTRAVENOUS

## 2013-12-28 MED ORDER — MIDAZOLAM HCL 5 MG/5ML IJ SOLN
INTRAMUSCULAR | Status: DC | PRN
  Administered 2013-12-28: 2 mg via INTRAVENOUS

## 2013-12-28 MED ORDER — PROMETHAZINE HCL 25 MG/ML IJ SOLN: 6.2500 mg | INTRAMUSCULAR | Status: DC | PRN

## 2013-12-28 MED ORDER — 0.9 % SODIUM CHLORIDE (POUR BTL) OPTIME
TOPICAL | Status: DC | PRN
  Administered 2013-12-28: 1000 mL

## 2013-12-28 MED ORDER — LIDOCAINE HCL 2 % EX GEL
CUTANEOUS | Status: AC
  Filled 2013-12-28: qty 10

## 2013-12-28 MED ORDER — EPHEDRINE SULFATE 50 MG/ML IJ SOLN
INTRAMUSCULAR | Status: DC | PRN
  Administered 2013-12-28: 10 mg via INTRAVENOUS
  Administered 2013-12-28: 5 mg via INTRAVENOUS

## 2013-12-28 SURGICAL SUPPLY — 7 items
BAG URINE DRAINAGE (UROLOGICAL SUPPLIES) ×3 IMPLANT
CATH FOLEY 2WAY SLVR  5CC 18FR (CATHETERS) ×2
CATH FOLEY 2WAY SLVR 5CC 18FR (CATHETERS) ×1 IMPLANT
DRAPE CAMERA CLOSED 9X96 (DRAPES) ×3 IMPLANT
GUIDEWIRE STR DUAL SENSOR (WIRE) ×3 IMPLANT
PACK CYSTO (CUSTOM PROCEDURE TRAY) ×3 IMPLANT
STENT CONTOUR 6FRX28X.038 (STENTS) ×3 IMPLANT

## 2013-12-28 NOTE — Anesthesia Postprocedure Evaluation (Signed)
  Anesthesia Post-op Note  Patient: [name] [name]  Procedure(s) Performed: Procedure(s) (LRB): CYSTOSCOPY WITH STENT PLACEMENT left retrograde pyleogram (Left)  Patient Location: PACU  Anesthesia Type: General  Level of Consciousness: awake and alert   Airway and Oxygen Therapy: Patient Spontanous Breathing  Post-op Pain: mild  Post-op Assessment: Post-op Vital signs reviewed, Patient's Cardiovascular Status Stable, Respiratory Function Stable, Patent Airway and No signs of Nausea or vomiting  Last Vitals:  Filed Vitals:   12/28/13 0700  BP: 98/65  Pulse: 90  Temp:   Resp: 16    Post-op Vital Signs: stable   Complications: No apparent anesthesia complications. To stepdown per plan.

## 2013-12-28 NOTE — Transfer of Care (Signed)
Immediate Anesthesia Transfer of Care Note  Patient: [name] [name]  Procedure(s) Performed: Procedure(s) (LRB): CYSTOSCOPY WITH STENT PLACEMENT left retrograde pyleogram (Left)  Patient Location: PACU  Anesthesia Type: General  Level of Consciousness: sedated, patient cooperative and responds to stimulation  Airway & Oxygen Therapy: Patient Spontanous Breathing and Patient connected to face mask oxgen  Post-op Assessment: Report given to PACU RN and Post -op Vital signs reviewed and stable  Post vital signs: Reviewed and stable  Complications: No apparent anesthesia complications

## 2013-12-28 NOTE — Preoperative (Signed)
Beta Blockers   Reason not to administer Beta Blockers:Not Applicable 

## 2013-12-28 NOTE — Anesthesia Preprocedure Evaluation (Addendum)
Anesthesia Evaluation  Patient identified by MRN, date of birth, ID band Patient awake    Reviewed: Allergy & Precautions, H&P , NPO status , Patient's Chart, lab work & pertinent test results  Airway Mallampati: II TM Distance: >3 FB Neck ROM: Full    Dental no notable dental hx.    Pulmonary sleep apnea , former smoker,  breath sounds clear to auscultation  Pulmonary exam normal       Cardiovascular Exercise Tolerance: Good hypertension, Pt. on medications Rhythm:Regular Rate:Normal     Neuro/Psych negative neurological ROS  negative psych ROS   GI/Hepatic negative GI ROS, Neg liver ROS, GERD-  ,  Endo/Other  negative endocrine ROS  Renal/GU Renal disease  negative genitourinary   Musculoskeletal negative musculoskeletal ROS (+)   Abdominal (+) + obese,   Peds negative pediatric ROS (+)  Hematology  (+) anemia ,   Anesthesia Other Findings   Reproductive/Obstetrics negative OB ROS                          Anesthesia Physical Anesthesia Plan  ASA: III  Anesthesia Plan: General   Post-op Pain Management:    Induction: Intravenous  Airway Management Planned: LMA  Additional Equipment:   Intra-op Plan:   Post-operative Plan: Extubation in OR  Informed Consent: I have reviewed the patients History and Physical, chart, labs and discussed the procedure including the risks, benefits and alternatives for the proposed anesthesia with the patient or authorized representative who has indicated his/her understanding and acceptance.   Dental advisory given  Plan Discussed with: CRNA  Anesthesia Plan Comments:         Anesthesia Quick Evaluation

## 2013-12-28 NOTE — Op Note (Signed)
Urology Operative Report  Date of Procedure: 12/28/13  Surgeon: Rolan Bucco, MD Assistant:  None  Preoperative Diagnosis: Left ureter stone. SIRS. Postoperative Diagnosis:  Same  Procedure(s): Left ureter stent placement. Left retrograde pyelogram with interpretation. Cystoscopy.  Estimated blood loss: None  Specimen: None  Drains: 90ZE catheter  Complications: None  Findings: Mild left hydronephrosis. Return of purulent material with stent placement.  History of present illness: 58 year old male presents to the ER with abdominal pain. He met SIRS criteria. CT scan revealed a left obstructing proximal ureter stone. He elected to proceed to the operating room for stent placement.   Procedure in detail: After informed consent was obtained, the patient was taken to the operating room. They were placed in the supine position. SCDs were turned on and in place. IV antibiotics were infused, and general anesthesia was induced. A timeout was performed in which the correct patient, surgical site, and procedure were identified and agreed upon by the team.  The patient was placed in a dorsolithotomy position, making sure to pad all pertinent neurovascular pressure points. The genitals were prepped and draped in the usual sterile fashion.  A rigid cystoscope was advanced through the urethra and into the bladder. The bladder was drained and then fully distended and evaluated in a systematic fashion to visualize the entire surface of the bladder. It was negative bladder tumors.  Attention was turned to the left ureter orifice. It was cannulated with a 5 [place] ureter catheter. I injected 10 cc of Omnipaque to obtain a left retrograde pyelogram. It was difficult to note any filling defect. There was noted to be some of hydronephrosis on the left side.  A sensor wire was passed through the left ureter orifice and into the left renal pelvis on fluoroscopy. A 6 x 28 double-J ureter stent  without tether was placed over the wire with ease. A good curl deployed in the left renal pelvis and a curl was noted in the bladder. There was return of purulent material.  The cystoscope was removed and I placed 10 cc of lidocaine jelly to the urethra. I then placed an 55 French Foley catheter with 10 cc of sterile water was placed into the bladder. I placed a belladonna and opium suppository into the rectum.  Scrotum and penis were negative for edema or signs of infection.  This completed the procedure. He's placed back in supine position, anesthesia reversed, and he was taken to the [facility] in stable condition. He will be admitted to the hospitalist service.  All counts were correct at the end of the case.

## 2013-12-28 NOTE — Progress Notes (Signed)
[facility] PROGRESS NOTE  [name] [name] ZOX:[id number] DOB: April 26, 1956 DOA: 12/27/2013 PCP: Katy Apo, MD  Assessment/Plan: Severe Sepsis -Due to pyelonephritis with obstructing ureteral stone. -Still hypotensive today. -Ordering fluid boluses. -See no need for vasopressors at present. -Keep in SDU.  Pyelonephritis/Left Ureteral Stone -s/p urgent left ureter stent placement/cystoscopy overnight. -Appreciate GU input. -Continue rocephin pending cx data.  ARF -2/2 above. -Hopefully will improve with fluid resuscitation.   Code Status: Full Code Family Communication: Patient only  Disposition Plan: Home when ready   Consultants:  GU, Dr. Margarita Grizzle   Antibiotics:  Rocephin 3/16   Subjective: Pain is improved. Hungry and wants to eat.  Objective: Filed Vitals:   12/28/13 0958 12/28/13 1000 12/28/13 1045 12/28/13 1200  BP: 82/49 89/42 95/50  105/56  Pulse: 87 85 92 86  Temp:    99 F (37.2 C)  TempSrc:    Oral  Resp: 10 10 21 14   Height:      Weight:      SpO2: 96% 97% 99% 96%    Intake/Output Summary (Last 24 hours) at 12/28/13 1614 Last data filed at 12/28/13 0957  Gross per 24 hour  Intake 1962.5 ml  Output    230 ml  Net 1732.5 ml   Filed Weights   12/28/13 0252  Weight: 142.9 kg (315 lb 0.6 oz)    Exam:   General:  AA Ox3  Cardiovascular: RRR  Respiratory: CTA B  Abdomen: obese/S/NT/ND  Extremities: no C/C/E   Neurologic:  Intact, non-focal  Data Reviewed: Basic Metabolic Panel:  Recent Labs Lab 12/27/13 1552 12/28/13 0335  NA 138 138  K 3.3* 3.7  CL 99 100  CO2 19 23  GLUCOSE 131* 130*  BUN 27* 33*  CREATININE 2.72* 3.41*  CALCIUM 9.3 8.4   Liver Function Tests:  Recent Labs Lab 12/27/13 1552 12/28/13 0335  AST 31 34  ALT 18 22  ALKPHOS 211* 92  BILITOT 0.9 0.6  PROT 8.2 6.9  ALBUMIN 3.5 2.9*   No results found for this basename: LIPASE, AMYLASE,  in the last 168 hours No results found for this  basename: AMMONIA,  in the last 168 hours CBC:  Recent Labs Lab 12/27/13 1715 12/28/13 0335  WBC 9.0 18.1*  HGB 13.0 12.7*  HCT 37.7* 38.3*  MCV 79.2 81.5  PLT 106* PLATELET CLUMPS NOTED ON SMEAR, COUNT APPEARS DECREASED   Cardiac Enzymes: No results found for this basename: CKTOTAL, CKMB, CKMBINDEX, TROPONINI,  in the last 168 hours BNP (last 3 results)  Recent Labs  12/27/13 1552  PROBNP 1097.0*   CBG:  Recent Labs Lab 12/27/13 1549  GLUCAP 123*    Recent Results (from the past 240 hour(s))  MRSA PCR SCREENING     Status: None   Collection Time    12/28/13  3:07 AM      Result Value Ref Range Status   MRSA by PCR NEGATIVE  NEGATIVE Final   Comment:            The GeneXpert MRSA Assay (FDA     approved for NASAL specimens     only), is one component of a     comprehensive MRSA colonization     surveillance program. It is not     intended to diagnose MRSA     infection nor to guide or     monitor treatment for     MRSA infections.     Studies: Ct Abdomen Pelvis Wo Contrast  12/27/2013  CLINICAL DATA:  Pain.  Hypotension.  EXAM: CT ABDOMEN AND PELVIS WITHOUT CONTRAST  TECHNIQUE: Multidetector CT imaging of the abdomen and pelvis was performed following the standard protocol without intravenous contrast.  COMPARISON:  DG ABD ACUTE W/CHEST dated 12/13/2012  FINDINGS: Liver normal. Loops spleen normal. The pancreas normal. No biliary distention. Gallbladder sludge cannot be excluded. No gallbladder distention or gallbladder wall thickening. No pericholecystic fluid collections.  Mild right adrenal fullness consistent with adrenal hyperplasia. Nonobstructive right nephrolithiasis. 6 mm stone within the proximal left ureter with mild hydronephrosis. Nonobstructive left nephrolithiasis. Simple cyst left kidney. Left kidney is swollen. This could be from hydronephrosis and/or pyelonephritis. Perinephric fluid is present. The bladder is contracted. Bladder wall thickening is  most likely secondary to contraction, bladder pathology cannot be excluded. Prostate is not enlarged. No pathologic pelvic fluid collections.  Shotty inguinal lymph nodes.  Aorta normal caliber.  Appendix normal. No bowel distention. No free air. Small midline hernia with herniation of fat. Mild soft tissue prominence in this region, most likely related to scarring.  Heart size normal. Mild pericardial thickening versus tiny effusion noted. Small pleural effusions. Bibasilar atelectasis. Severe degenerative changes lumbar spine.  IMPRESSION: 1. 6 mm stone proximal left ureter with mild hydronephrosis. Prominent left renal swelling is present. This could be secondary to the hydronephrosis or process such as pyelonephritis.  2. Bilateral nonobstructive nephrolithiasis. 3. Right adrenal hyperplasia. 4. Mild pericardial thickening versus tiny effusion.   Electronically Signed   By: Maisie Fushomas  Register   On: 12/27/2013 18:44   Nm Pulmonary Perf And Vent  12/27/2013   CLINICAL DATA:  Shortness of breath and syncope.  EXAM: NUCLEAR MEDICINE VENTILATION - PERFUSION LUNG SCAN  TECHNIQUE: Ventilation images were obtained in multiple projections using inhaled aerosol technetium 99 M DTPA. Perfusion images were obtained in multiple projections after intravenous injection of Tc-5729m MAA.  RADIOPHARMACEUTICALS:  38.9 mCi Tc-9129m DTPA aerosol and 5.5 mCi Tc-4329m MAA  COMPARISON:  12/27/2013  FINDINGS: Ventilation: No focal ventilation defect.  Perfusion: No wedge shaped peripheral perfusion defects to suggest acute pulmonary embolism.  IMPRESSION: Very low probability for acute pulmonary embolus.   Electronically Signed   By: Signa Kellaylor  Stroud M.D.   On: 12/27/2013 19:25   Dg Chest Port 1 View  12/27/2013   CLINICAL DATA:  Shortness of breath and chest pain  EXAM: PORTABLE CHEST - 1 VIEW  COMPARISON:  12/13/2012  FINDINGS: Cardiac shadow is stable. The lungs are clear bilaterally. No acute bony abnormality is seen.  IMPRESSION: No  acute abnormality noted.   Electronically Signed   By: Alcide CleverMark  Lukens M.D.   On: 12/27/2013 16:29    Scheduled Meds: . cefTRIAXone (ROCEPHIN)  IV  1 g Intravenous Q24H  . heparin  5,000 Units Subcutaneous 3 times per day  . sodium chloride  3 mL Intravenous Q12H   Continuous Infusions: . sodium chloride 150 mL/hr at 12/28/13 1434    Principal Problem:   Severe sepsis with acute organ dysfunction Active Problems:   Acute kidney failure   Hypotension   Lactic acidosis   Sepsis secondary to UTI   Acute pyelonephritis   Ureteral stone with hydronephrosis    Time spent: 35 minutes. Greater than 50% of this time was spent in direct contact with the patient coordinating care.    Chaya JanHERNANDEZ ACOSTA,ESTELA  [facility] Pager [phone]  If 7PM-7AM, please contact night-coverage at [email or web address], password [email or web address] 12/28/2013, 4:14 PM  LOS: 1 day

## 2013-12-28 NOTE — ED Notes (Signed)
Blood cultures obtained after antibiotics given. Dr. Julian ReilGardner made aware

## 2013-12-28 NOTE — Progress Notes (Signed)
CARE MANAGEMENT NOTE 12/28/2013  Patient:  [name] [name],[name] [name]   Account Number:  [id number]  Date Initiated:  12/28/2013  Documentation initiated by:  Gleason Ardoin  Subjective/Objective Assessment:   urolithiasis with obstruction, required or emergently for stent placement, hypotensive postop and placed in sdu with iv fld boluses, sirs present.     Action/Plan:   home when stable, has family support, is normally independant in his care.   Anticipated DC Date:  12/31/2013   Anticipated DC Plan:  HOME/SELF CARE  In-house referral  NA      DC Planning Services  NA      [facility] Choice  NA   Choice offered to / List presented to:  NA   DME arranged  NA      DME agency  NA     HH arranged  NA      HH agency  NA   Status of service:  In process, will continue to follow Medicare Important Message given?  NA - LOS <3 / Initial given by admissions (If response is "NO", the following Medicare IM given date fields will be blank) Date Medicare IM given:   Date Additional Medicare IM given:    Discharge Disposition:    Per UR Regulation:  Reviewed for med. necessity/level of care/duration of stay  If discussed at Long Length of Stay Meetings, dates discussed:    Comments:  [id number]/Darshana Curnutt Stark JockDavis, RN, BSN, [place] [phone] Chart Reviewed for discharge and hospital needs. Discharge needs at time of review:  None present will follow for needs. Review of patient progress due on [id number].

## 2013-12-29 ENCOUNTER — Encounter (HOSPITAL_COMMUNITY): Payer: Self-pay | Admitting: Urology

## 2013-12-29 DIAGNOSIS — I959 Hypotension, unspecified: Secondary | ICD-10-CM

## 2013-12-29 LAB — LACTIC ACID, PLASMA
LACTIC ACID, VENOUS: 1.3 mmol/L (ref 0.5–2.2)
LACTIC ACID, VENOUS: 1.1 mmol/L (ref 0.5–2.2)
Lactic Acid, Venous: 1.5 mmol/L (ref 0.5–2.2)

## 2013-12-29 LAB — BASIC METABOLIC PANEL
BUN: 38 mg/dL — ABNORMAL HIGH (ref 6–23)
CO2: 20 mEq/L (ref 19–32)
Calcium: 8 mg/dL — ABNORMAL LOW (ref 8.4–10.5)
Chloride: 108 mEq/L (ref 96–112)
Creatinine, Ser: 2.82 mg/dL — ABNORMAL HIGH (ref 0.50–1.35)
GFR calc Af Amer: 27 mL/min — ABNORMAL LOW (ref >90)
GFR calc non Af Amer: 23 mL/min — ABNORMAL LOW (ref >90)
Glucose, Bld: 114 mg/dL — ABNORMAL HIGH (ref 70–99)
Potassium: 3.4 mEq/L — ABNORMAL LOW (ref 3.7–5.3)
SODIUM: 141 meq/L (ref 137–147)

## 2013-12-29 LAB — CBC
HCT: 36.4 % — ABNORMAL LOW (ref 39.0–52.0)
Hemoglobin: 12.1 g/dL — ABNORMAL LOW (ref 13.0–17.0)
MCH: 26.4 pg (ref 26.0–34.0)
MCHC: 33.2 g/dL (ref 30.0–36.0)
MCV: 79.5 fL (ref 78.0–100.0)
PLATELETS: 111 10*3/uL — AB (ref 150–400)
RBC: 4.58 MIL/uL (ref 4.22–5.81)
RDW: 15.7 % — ABNORMAL HIGH (ref 11.5–15.5)
WBC: 14.8 10*3/uL — AB (ref 4.0–10.5)

## 2013-12-29 LAB — URINE CULTURE: Colony Count: 30000

## 2013-12-29 NOTE — Progress Notes (Signed)
[facility] PROGRESS NOTE  [name] [name] UJW:[id number] DOB: 02/09/1956 DOA: 12/27/2013 PCP: Katy Apo, MD  Assessment/Plan: Severe Sepsis -Secondary to pyelonephritis with obstructing ureteral stone. -Labile BP - Stable at present -Keep in SDU. - On rocephin  Pyelonephritis/Left Ureteral Stone -s/p urgent left ureter stent placement/cystoscopy overnight. -Appreciate GU input. -Continue rocephin pending cx data.  ARF -2/2 above. -Improving - Cont monitor renal fx  Code Status: Full Code Family Communication: Patient only  Disposition Plan: Home when ready   Consultants:  GU, Dr. Margarita Grizzle   Antibiotics:  Rocephin 3/16 >>>  Subjective: No acute events overnight  Objective: Filed Vitals:   12/29/13 0600 12/29/13 0700 12/29/13 0759 12/29/13 0800  BP:      Pulse: 74 70 77 77  Temp:      TempSrc:      Resp: 21 14 18 18   Height:      Weight:      SpO2: 100% 96% 100% 100%    Intake/Output Summary (Last 24 hours) at 12/29/13 0805 Last data filed at 12/29/13 0700  Gross per 24 hour  Intake 4407.5 ml  Output   1150 ml  Net 3257.5 ml   Filed Weights   12/28/13 0252 12/29/13 0400  Weight: 142.9 kg (315 lb 0.6 oz) 144.9 kg (319 lb 7.1 oz)    Exam:   General:  AA Ox3  Cardiovascular: RRR  Respiratory: CTA B  Abdomen: obese/S/NT/ND  Extremities: no C/C/E   Neurologic:  Intact, non-focal  Data Reviewed: Basic Metabolic Panel:  Recent Labs Lab 12/27/13 1552 12/28/13 0335 12/29/13 0515  NA 138 138 141  K 3.3* 3.7 3.4*  CL 99 100 108  CO2 19 23 20   GLUCOSE 131* 130* 114*  BUN 27* 33* 38*  CREATININE 2.72* 3.41* 2.82*  CALCIUM 9.3 8.4 8.0*   Liver Function Tests:  Recent Labs Lab 12/27/13 1552 12/28/13 0335  AST 31 34  ALT 18 22  ALKPHOS 211* 92  BILITOT 0.9 0.6  PROT 8.2 6.9  ALBUMIN 3.5 2.9*   No results found for this basename: LIPASE, AMYLASE,  in the last 168 hours No results found for this basename: AMMONIA,  in  the last 168 hours CBC:  Recent Labs Lab 12/27/13 1715 12/28/13 0335 12/29/13 0515  WBC 9.0 18.1* 14.8*  HGB 13.0 12.7* 12.1*  HCT 37.7* 38.3* 36.4*  MCV 79.2 81.5 79.5  PLT 106* PLATELET CLUMPS NOTED ON SMEAR, COUNT APPEARS DECREASED 111*   Cardiac Enzymes: No results found for this basename: CKTOTAL, CKMB, CKMBINDEX, TROPONINI,  in the last 168 hours BNP (last 3 results)  Recent Labs  12/27/13 1552  PROBNP 1097.0*   CBG:  Recent Labs Lab 12/27/13 1549  GLUCAP 123*    Recent Results (from the past 240 hour(s))  MRSA PCR SCREENING     Status: None   Collection Time    12/28/13  3:07 AM      Result Value Ref Range Status   MRSA by PCR NEGATIVE  NEGATIVE Final   Comment:            The GeneXpert MRSA Assay (FDA     approved for NASAL specimens     only), is one component of a     comprehensive MRSA colonization     surveillance program. It is not     intended to diagnose MRSA     infection nor to guide or     monitor treatment for     MRSA infections.  Studies: Ct Abdomen Pelvis Wo Contrast  12/27/2013   CLINICAL DATA:  Pain.  Hypotension.  EXAM: CT ABDOMEN AND PELVIS WITHOUT CONTRAST  TECHNIQUE: Multidetector CT imaging of the abdomen and pelvis was performed following the standard protocol without intravenous contrast.  COMPARISON:  DG ABD ACUTE W/CHEST dated 12/13/2012  FINDINGS: Liver normal. Loops spleen normal. The pancreas normal. No biliary distention. Gallbladder sludge cannot be excluded. No gallbladder distention or gallbladder wall thickening. No pericholecystic fluid collections.  Mild right adrenal fullness consistent with adrenal hyperplasia. Nonobstructive right nephrolithiasis. 6 mm stone within the proximal left ureter with mild hydronephrosis. Nonobstructive left nephrolithiasis. Simple cyst left kidney. Left kidney is swollen. This could be from hydronephrosis and/or pyelonephritis. Perinephric fluid is present. The bladder is contracted. Bladder  wall thickening is most likely secondary to contraction, bladder pathology cannot be excluded. Prostate is not enlarged. No pathologic pelvic fluid collections.  Shotty inguinal lymph nodes.  Aorta normal caliber.  Appendix normal. No bowel distention. No free air. Small midline hernia with herniation of fat. Mild soft tissue prominence in this region, most likely related to scarring.  Heart size normal. Mild pericardial thickening versus tiny effusion noted. Small pleural effusions. Bibasilar atelectasis. Severe degenerative changes lumbar spine.  IMPRESSION: 1. 6 mm stone proximal left ureter with mild hydronephrosis. Prominent left renal swelling is present. This could be secondary to the hydronephrosis or process such as pyelonephritis.  2. Bilateral nonobstructive nephrolithiasis. 3. Right adrenal hyperplasia. 4. Mild pericardial thickening versus tiny effusion.   Electronically Signed   By: Maisie Fushomas  Register   On: 12/27/2013 18:44   Nm Pulmonary Perf And Vent  12/27/2013   CLINICAL DATA:  Shortness of breath and syncope.  EXAM: NUCLEAR MEDICINE VENTILATION - PERFUSION LUNG SCAN  TECHNIQUE: Ventilation images were obtained in multiple projections using inhaled aerosol technetium 99 M DTPA. Perfusion images were obtained in multiple projections after intravenous injection of Tc-4073m MAA.  RADIOPHARMACEUTICALS:  38.9 mCi Tc-5673m DTPA aerosol and 5.5 mCi Tc-4373m MAA  COMPARISON:  12/27/2013  FINDINGS: Ventilation: No focal ventilation defect.  Perfusion: No wedge shaped peripheral perfusion defects to suggest acute pulmonary embolism.  IMPRESSION: Very low probability for acute pulmonary embolus.   Electronically Signed   By: Signa Kellaylor  Stroud M.D.   On: 12/27/2013 19:25   Dg Chest Port 1 View  12/27/2013   CLINICAL DATA:  Shortness of breath and chest pain  EXAM: PORTABLE CHEST - 1 VIEW  COMPARISON:  12/13/2012  FINDINGS: Cardiac shadow is stable. The lungs are clear bilaterally. No acute bony abnormality is seen.   IMPRESSION: No acute abnormality noted.   Electronically Signed   By: Alcide CleverMark  Lukens M.D.   On: 12/27/2013 16:29    Scheduled Meds: . cefTRIAXone (ROCEPHIN)  IV  1 g Intravenous Q24H  . heparin  5,000 Units Subcutaneous 3 times per day  . sodium chloride  3 mL Intravenous Q12H   Continuous Infusions: . sodium chloride 150 mL/hr at 12/28/13 1434    Principal Problem:   Severe sepsis with acute organ dysfunction Active Problems:   Acute kidney failure   Hypotension   Lactic acidosis   Sepsis secondary to UTI   Acute pyelonephritis   Ureteral stone with hydronephrosis    Time spent: 35 minutes. Greater than 50% of this time was spent in direct contact with the patient coordinating care.    Jerald KiefCHIU, STEPHEN K  [facility] Pager [phone]  If 7PM-7AM, please contact night-coverage at [email or web address], password [email or web address] 12/29/2013, 8:05 AM  LOS: 2 days

## 2013-12-29 NOTE — Progress Notes (Signed)
Gave report to 4W

## 2013-12-29 NOTE — Progress Notes (Signed)
Urology Progress Note  Subjective:     No acute urologic events overnight. No high fevers, but remain ill.  We discussed management options for his stone which include observation, shockwave lithotripsy, and ureteroscopy. He is unlikely to pass the stone on his. I do not recommend shockwave lithotripsy in stones that have been infected as I feel this increases the risk of repeat episode of sepsis. We discussed ureteroscopy along with risks, benefits, side effects which include but are not limited to bleeding, infection, allergic reaction, heart attack, stroke, death, ureteral avulsion, ureter injury, ureter stricture, and need for repeat procedure.   ROS: Positive: abdominal discomfort.  Objective:  Patient Vitals for the past 24 hrs:  BP Temp Temp src Pulse Resp SpO2 Weight  12/29/13 0800 - 98.9 F (37.2 C) Oral 77 18 100 % -  12/29/13 0759 - - - 77 18 100 % -  12/29/13 0700 - - - 70 14 96 % -  12/29/13 0600 - - - 74 21 100 % -  12/29/13 0500 - - - 86 19 98 % -  12/29/13 0400 - 98.2 F (36.8 C) Oral 75 20 95 % 144.9 kg (319 lb 7.1 oz)  12/29/13 0300 - - - 81 23 96 % -  12/29/13 0200 - - - 88 19 95 % -  12/29/13 0000 - 99.1 F (37.3 C) Oral - - - -  12/28/13 2330 124/68 mmHg - - 87 20 97 % -  12/28/13 2000 117/61 mmHg 99.9 F (37.7 C) Oral 80 19 100 % -  12/28/13 1649 92/44 mmHg - - 89 28 96 % -  12/28/13 1600 - 99.5 F (37.5 C) Oral - - - -  12/28/13 1500 91/33 mmHg - - 89 7 97 % -  12/28/13 1400 106/57 mmHg - - 96 16 98 % -  12/28/13 1200 105/56 mmHg 99 F (37.2 C) Oral 86 14 96 % -    Physical Exam: General:  No acute distress, awake Cardiovascular:    [x]   S1/S2 present, RRR  []   Irregularly irregular Chest:  CTA-B Abdomen:               []  Soft, appropriately TTP  [x]  Soft, NTTP  []  Soft, appropriately TTP, incision(s) clean/dry/intact  Genitourinary: Foley in place. Draining clear yellow urine.     I/O last 3 completed shifts: In: 5870 [P.O.:300;  I.V.:5120; Other:400; IV Piggyback:50] Out: 1380 [Urine:1380]  Recent Labs     12/28/13  0335  12/29/13  0515  HGB  12.7*  12.1*  WBC  18.1*  14.8*  PLT  PLATELET CLUMPS NOTED ON SMEAR, COUNT APPEARS DECREASED  111*    Recent Labs     12/28/13  0335  12/29/13  0515  NA  138  141  K  3.7  3.4*  CL  100  108  CO2  23  20  BUN  33*  38*  CREATININE  3.41*  2.82*  CALCIUM  8.4  8.0*  GFRNONAA  19*  23*  GFRAA  22*  27*     No results found for this basename: PT, INR, APTT,  in the last 72 hours   No components found with this basename: ABG,     Length of stay: 2 days.  Assessment: Left ureter stone. UTI. SIRS. POD#2 Left ureter stent placement.   Plan: He would like to be scheduled for cystoscopy, left ureteroscopy, laser lithotripsy, left retrograde PolyGram, and possible left ureter stent placement.  I will likely schedule this for 3-4 weeks from now to allow him to clear his infection.  Have him followup with me in clinic as an outpatient to ensure infection is cleared.  He did not wish to remove his catheter days cc he's not up and moving much. Okay to discontinue catheter at the discretion of the primary team.  Please call with questions.   Natalia Leatherwood, MD [phone]

## 2013-12-30 LAB — BASIC METABOLIC PANEL
BUN: 31 mg/dL — AB (ref 6–23)
CO2: 22 mEq/L (ref 19–32)
CREATININE: 1.94 mg/dL — AB (ref 0.50–1.35)
Calcium: 8.5 mg/dL (ref 8.4–10.5)
Chloride: 109 mEq/L (ref 96–112)
GFR, EST AFRICAN AMERICAN: 43 mL/min — AB (ref >90)
GFR, EST NON AFRICAN AMERICAN: 37 mL/min — AB (ref >90)
Glucose, Bld: 101 mg/dL — ABNORMAL HIGH (ref 70–99)
Potassium: 3.9 mEq/L (ref 3.7–5.3)
Sodium: 144 mEq/L (ref 137–147)

## 2013-12-30 LAB — CBC
HCT: 37.6 % — ABNORMAL LOW (ref 39.0–52.0)
Hemoglobin: 12.2 g/dL — ABNORMAL LOW (ref 13.0–17.0)
MCH: 26.1 pg (ref 26.0–34.0)
MCHC: 32.4 g/dL (ref 30.0–36.0)
MCV: 80.5 fL (ref 78.0–100.0)
PLATELETS: 119 10*3/uL — AB (ref 150–400)
RBC: 4.67 MIL/uL (ref 4.22–5.81)
RDW: 15.8 % — ABNORMAL HIGH (ref 11.5–15.5)
WBC: 14.7 10*3/uL — AB (ref 4.0–10.5)

## 2013-12-30 MED ORDER — CIPROFLOXACIN HCL 500 MG PO TABS: 500.0000 mg | ORAL_TABLET | Freq: Two times a day (BID) | ORAL | Status: DC

## 2013-12-30 NOTE — Progress Notes (Deleted)
Urology Progress Note  Subjective:     No acute urologic events overnight. Negative flank pain. She does not recall being told that she had hydronephrosis.  ROS: Positive: abdominal discomfort.  Objective:  Patient Vitals for the past 24 hrs:  BP Temp Temp src Pulse Resp SpO2  12/30/13 0500 151/96 mmHg 98.8 F (37.1 C) Oral 69 20 100 %  12/29/13 2055 158/89 mmHg 100.1 F (37.8 C) Oral 77 20 97 %  12/29/13 1434 133/83 mmHg 99.8 F (37.7 C) Oral 86 20 99 %  12/29/13 1300 - - - 63 21 98 %    Physical Exam: General:  No acute distress, awake Cardiovascular:    [x]   S1/S2 present, RRR  []   Irregularly irregular Chest:  CTA-B Abdomen:               []  Soft, appropriately TTP  [x]  Soft, NTTP  []  Soft, appropriately TTP, incision(s) clean/dry/intact  Genitourinary: Foley in place. Draining clear yellow urine.     I/O last 3 completed shifts: In: 5052.5 [P.O.:480; I.V.:4472.5; IV Piggyback:100] Out: 3295 [Urine:3295]  Recent Labs     12/29/13  0515  12/30/13  0453  HGB  12.1*  12.2*  WBC  14.8*  14.7*  PLT  111*  119*    Recent Labs     12/29/13  0515  12/30/13  0453  NA  141  144  K  3.4*  3.9  CL  108  109  CO2  20  22  BUN  38*  31*  CREATININE  2.82*  1.94*  CALCIUM  8.0*  8.5  GFRNONAA  23*  37*  GFRAA  27*  43*     No results found for this basename: PT, INR, APTT,  in the last 72 hours   No components found with this basename: ABG,   12/29/13: renal US shows continued right hydronephrosis, essentially unchanged.  Length of stay: 3 days.  Assessment: Left ureter stone. UTI. SIRS. POD#3 Left ureter stent placement.   Plan: This is likely a chronic issue. I explained to the patient we need to find out if she is truly obstructed. This will require a MAG3 lasix renogram. Renal function needs to continue to improve before that can be done.   I explained the risks of long term renal obstruction.  If her renal function continues to improve at its  current rate, may be able to obtain MAG3 lasix renogram while in the hospital. Will follow and order when appropriate.   Natalia Leatherwoodaniel Saxton Chain, MD [phone]

## 2013-12-30 NOTE — Evaluation (Signed)
Physical Therapy One Time Evaluation Patient Details Name: [name] [name] MRN: [id number] DOB: Feb 26, 1956 Today's Date: 12/30/2013 Time: [id number] PT Time Calculation (min): 15 min  PT Assessment / Plan / Recommendation History of Present Illness  Pt is a 58 year old male admitted with fever, sepsis, and found to have pyelonephritis/Left Ureteral Stone, also has hx of arthritis and spinal stenosis  Clinical Impression  Patient evaluated by Physical Therapy with no further acute PT needs identified. All education has been completed and the patient has no further questions.  Pt able to tolerate hallway ambulation with RW.  Pt encouraged to slowly progress mobility.  Pt reports he uses his garage gym to work out 3x/week and educated to not yet return to weight lifting and resistive exercises until discussing with MD on his f/u appt however encouraged to continue ambulation and non-resistive/AROM exercises as tolerated.  Pt declines any follow-up Physical Therapy or equipment needs. PT is signing off. Thank you for this referral.     PT Assessment  Patent does not need any further PT services    Follow Up Recommendations  No PT follow up    Does the patient have the potential to tolerate intense rehabilitation      Barriers to Discharge        Equipment Recommendations  None recommended by PT    Recommendations for Other Services     Frequency      Precautions / Restrictions     Pertinent Vitals/Pain Very agreeable to therapy and mobilization, no specific c/o pain voiced during session      Mobility  Bed Mobility General bed mobility comments: pt up in recliner on arrival Transfers Overall transfer level: Modified independent Ambulation/Gait Ambulation/Gait assistance: Supervision;Modified independent (Device/Increase time) Ambulation Distance (Feet): 400 Feet Assistive device: Rolling walker (2 wheeled) Gait Pattern/deviations: Step-through pattern Gait velocity  interpretation: Below normal speed for age/gender General Gait Details: pt reports slow pace due to only his 2nd time ambulating during this admission, states he doesn't normally use assistive device however felt he needed RW today    Exercises     PT Diagnosis:    PT Problem List:   PT Treatment Interventions:       PT Goals(Current goals can be found in the care plan section) Acute Rehab PT Goals PT Goal Formulation: No goals set, d/c therapy  Visit Information  Last PT Received On: 12/30/13 Assistance Needed: +1 History of Present Illness: Pt is a 58 year old male admitted with fever, sepsis, and found to have pyelonephritis/Left Ureteral Stone, also has hx of arthritis and spinal stenosis       Prior Functioning  Home Living Family/patient expects to be discharged to:: Private residence Living Arrangements: Spouse/significant other Type of Home: House Home Layout: One level Home Equipment: Environmental consultantWalker - 2 wheels;Cane - single point Prior Function Level of Independence: Independent Comments: pt reports he has gym set up in his garage and works out 3x/week Communication Communication: No difficulties    Copywriter, advertisingCognition  Cognition Arousal/Alertness: Awake/alert Behavior During Therapy: WFL for tasks assessed/performed Overall Cognitive Status: Within Functional Limits for tasks assessed    Extremity/Trunk Assessment Lower Extremity Assessment Lower Extremity Assessment: Overall WFL for tasks assessed   Balance    End of Session PT - End of Session Activity Tolerance: Patient tolerated treatment well Patient left: in chair;with call bell/phone within reach  GP     Adorian Gwynne,KATHrine E 12/30/2013, 3:36 PM Zenovia JarredKati Angee Gupton, PT, DPT 12/30/2013 Pager: [phone]

## 2013-12-30 NOTE — Discharge Summary (Signed)
Physician Discharge Summary  [name] [name] ZOX:[id number] DOB: Jan 11, 1956 DOA: 12/27/2013  PCP: Katy ApoPOLITE,RONALD D, MD  Admit date: 12/27/2013 Discharge date: 12/30/2013  Time spent: 35 minutes  Recommendations for Outpatient Follow-up:  Follow up with PCP in 1-2 weeks Follow up with Dr. Margarita GrizzleWoodruff as scheduled in 3-4 weeks  Discharge Diagnoses:  Principal Problem:   Severe sepsis with acute organ dysfunction Active Problems:   Acute kidney failure   Hypotension   Lactic acidosis   Sepsis secondary to UTI   Acute pyelonephritis   Ureteral stone with hydronephrosis   Discharge Condition: Improved  Diet recommendation: Regular  Filed Weights   12/28/13 0252 12/29/13 0400  Weight: 142.9 kg (315 lb 0.6 oz) 144.9 kg (319 lb 7.1 oz)    History of present illness:  58 year old male presents to the ER with a 3 day history of fever to 101, and abdominal and left flank pain. CT scan tonight reveals bilateral nephrolithiasis. He is a left obstructing ureter stone. This is associated with stranding of the left kidney. He does not have leukocytosis, but he has an elevated lactic acid to 3.88. He never had kidney stones before. Nothing makes it better or worse. I've explained the patient that he needs systemic inflammatory response criteria. He is admitted to the hospital service. I've explained to the patient that I would recommend drainage of his kidney. This can be done with cystoscopy and retrograde ureter stent placement versus her kidneys nephrostolithotomy. We discussed how performed. We discussed risk and benefits, side effects. I recommend proceeding to the operating room for cystoscopy, left ureter stent placement, possible left retrograde pyelogram. We discussed the risk and benefits, side effects as well as likelihood of achieving goals, which include but are not limited to, bleeding, infection, allergic reaction, heart attack, stroke, death, inability to place stent, stent discomfort, and  pain. I explained to him this would not treat the stone and that we will have returned a later time to treat the stone with his infection is cleared.  Hospital Course:  Severe Sepsis  -Secondary to pyelonephritis with obstructing ureteral stone.  -Improved s/p stent placement and abx -Was initially in SDU, transitioned to the medical floor - On rocephin initially, to be transitioned to PO cipro on discharge Pyelonephritis/Left Ureteral Stone  -s/p urgent left ureter stent placement/cystoscopy on 3/17.  -Appreciate GU input.  ARF  -2/2 above.  -Improving s/p stent placement  Procedures:  L ueter stent placement w/ pyelogram, cystoscopy by Dr. Margarita GrizzleWoodruff 12/28/13  Consultations:  Urology - Dr. Margarita GrizzleWoodruff  Discharge Exam: Filed Vitals:   12/29/13 1300 12/29/13 1434 12/29/13 2055 12/30/13 0500  BP:  133/83 158/89 151/96  Pulse: 63 86 77 69  Temp:  99.8 F (37.7 C) 100.1 F (37.8 C) 98.8 F (37.1 C)  TempSrc:  Oral Oral Oral  Resp: 21 20 20 20   Height:      Weight:      SpO2: 98% 99% 97% 100%    General: awake, in nad Cardiovascular: regular, s1, s2 Respiratory: normal resp effort, no wheezing  Discharge Instructions     Medication List         acetaminophen 500 MG tablet  Commonly known as:  TYLENOL  Take 1,000 mg by mouth every 6 (six) hours as needed for mild pain or headache.     calcium citrate-vitamin D 315-200 MG-UNIT per tablet  Commonly known as:  CITRACAL+D  Take 1 tablet by mouth 2 (two) times daily.  ciprofloxacin 500 MG tablet  Commonly known as:  CIPRO  Take 1 tablet (500 mg total) by mouth 2 (two) times daily.     multivitamin with minerals Tabs tablet  Take 1 tablet by mouth 2 (two) times daily.     oxyCODONE 5 MG immediate release tablet  Commonly known as:  Oxy IR/ROXICODONE  Take 5 mg by mouth every 6 (six) hours as needed for severe pain.     NASCOBAL 500 MCG/0.1ML Soln  Generic drug:  Cyanocobalamin  Place 500 mcg into right nostril  every Saturday.     vitamin B-12 1000 MCG tablet  Commonly known as:  CYANOCOBALAMIN  Take 1,000 mcg by mouth daily.       Allergies  Allergen Reactions  . Unisom [Doxylamine Succinate (Sleep)] Hives   Follow-up Information   Follow up with POLITE,RONALD D, MD. Schedule an appointment as soon as possible for a visit in 1 week.   Specialty:  Internal Medicine   Contact information:   [address] [place] [address] [phone]       Follow up with Milford Cage, MD. Call in 3 weeks.   Specialty:  Urology   Contact information:   [address] [facility]  [place] [address] [phone]        The results of significant diagnostics from this hospitalization (including imaging, microbiology, ancillary and laboratory) are listed below for reference.    Significant Diagnostic Studies: Ct Abdomen Pelvis Wo Contrast  12/27/2013   CLINICAL DATA:  Pain.  Hypotension.  EXAM: CT ABDOMEN AND PELVIS WITHOUT CONTRAST  TECHNIQUE: Multidetector CT imaging of the abdomen and pelvis was performed following the standard protocol without intravenous contrast.  COMPARISON:  DG ABD ACUTE W/CHEST dated 12/13/2012  FINDINGS: Liver normal. Loops spleen normal. The pancreas normal. No biliary distention. Gallbladder sludge cannot be excluded. No gallbladder distention or gallbladder wall thickening. No pericholecystic fluid collections.  Mild right adrenal fullness consistent with adrenal hyperplasia. Nonobstructive right nephrolithiasis. 6 mm stone within the proximal left ureter with mild hydronephrosis. Nonobstructive left nephrolithiasis. Simple cyst left kidney. Left kidney is swollen. This could be from hydronephrosis and/or pyelonephritis. Perinephric fluid is present. The bladder is contracted. Bladder wall thickening is most likely secondary to contraction, bladder pathology cannot be excluded. Prostate is not enlarged. No pathologic  pelvic fluid collections.  Shotty inguinal lymph nodes.  Aorta normal caliber.  Appendix normal. No bowel distention. No free air. Small midline hernia with herniation of fat. Mild soft tissue prominence in this region, most likely related to scarring.  Heart size normal. Mild pericardial thickening versus tiny effusion noted. Small pleural effusions. Bibasilar atelectasis. Severe degenerative changes lumbar spine.  IMPRESSION: 1. 6 mm stone proximal left ureter with mild hydronephrosis. Prominent left renal swelling is present. This could be secondary to the hydronephrosis or process such as pyelonephritis.  2. Bilateral nonobstructive nephrolithiasis. 3. Right adrenal hyperplasia. 4. Mild pericardial thickening versus tiny effusion.   Electronically Signed   By: Maisie Fus  Register   On: 12/27/2013 18:44   Nm Pulmonary Perf And Vent  12/27/2013   CLINICAL DATA:  Shortness of breath and syncope.  EXAM: NUCLEAR MEDICINE VENTILATION - PERFUSION LUNG SCAN  TECHNIQUE: Ventilation images were obtained in multiple projections using inhaled aerosol technetium 99 M DTPA. Perfusion images were obtained in multiple projections after intravenous injection of Tc-66m MAA.  RADIOPHARMACEUTICALS:  38.9 mCi Tc-79m DTPA aerosol and 5.5 mCi Tc-65m MAA  COMPARISON:  12/27/2013  FINDINGS: Ventilation: No focal ventilation defect.  Perfusion: No wedge shaped peripheral perfusion defects to suggest acute pulmonary embolism.  IMPRESSION: Very low probability for acute pulmonary embolus.   Electronically Signed   By: Signa Kell M.D.   On: 12/27/2013 19:25   Dg Chest Port 1 View  12/27/2013   CLINICAL DATA:  Shortness of breath and chest pain  EXAM: PORTABLE CHEST - 1 VIEW  COMPARISON:  12/13/2012  FINDINGS: Cardiac shadow is stable. The lungs are clear bilaterally. No acute bony abnormality is seen.  IMPRESSION: No acute abnormality noted.   Electronically Signed   By: Alcide Clever M.D.   On: 12/27/2013 16:29     Microbiology: Recent Results (from the past 240 hour(s))  CULTURE, BLOOD (ROUTINE X 2)     Status: None   Collection Time    12/28/13 12:15 AM      Result Value Ref Range Status   Specimen Description BLOOD LEFT ANTECUBITAL   Final   Special Requests BOTTLES DRAWN AEROBIC AND ANAEROBIC 5CC   Final   Culture  Setup Time     Final   Value: 12/28/2013 04:35     Performed at [facility]   Culture     Final   Value:        BLOOD CULTURE RECEIVED NO GROWTH TO DATE CULTURE WILL BE HELD FOR 5 DAYS BEFORE ISSUING A FINAL NEGATIVE REPORT     Performed at [facility]   Report Status PENDING   Incomplete  CULTURE, BLOOD (ROUTINE X 2)     Status: None   Collection Time    12/28/13 12:20 AM      Result Value Ref Range Status   Specimen Description BLOOD BLOOD LEFT FOREARM   Final   Special Requests BOTTLES DRAWN AEROBIC AND ANAEROBIC 5CC   Final   Culture  Setup Time     Final   Value: 12/28/2013 04:35     Performed at [facility]   Culture     Final   Value:        BLOOD CULTURE RECEIVED NO GROWTH TO DATE CULTURE WILL BE HELD FOR 5 DAYS BEFORE ISSUING A FINAL NEGATIVE REPORT     Performed at [facility]   Report Status PENDING   Incomplete  URINE CULTURE     Status: None   Collection Time    12/28/13  2:50 AM      Result Value Ref Range Status   Specimen Description URINE, CATHETERIZED   Final   Special Requests A   Final   Culture  Setup Time     Final   Value: 12/28/2013 08:39     Performed at [facility] Count     Final   Value: 30,000 COLONIES/ML     Performed at [facility]   Culture     Final   Value: ESCHERICHIA COLI     Performed at [facility]   Report Status 12/29/2013 FINAL   Final   Organism ID, Bacteria ESCHERICHIA COLI   Final  MRSA PCR SCREENING     Status: None   Collection Time    12/28/13  3:07 AM      Result Value Ref Range Status   MRSA by PCR NEGATIVE  NEGATIVE Final   Comment:             The GeneXpert MRSA Assay (FDA     approved  for NASAL specimens     only), is one component of a     comprehensive MRSA colonization     surveillance program. It is not     intended to diagnose MRSA     infection nor to guide or     monitor treatment for     MRSA infections.     Labs: Basic Metabolic Panel:  Recent Labs Lab 12/27/13 1552 12/28/13 0335 12/29/13 0515 12/30/13 0453  NA 138 138 141 144  [name] 3.3* 3.7 3.4* 3.9  CL 99 100 108 109  CO2 19 23 20 22   GLUCOSE 131* 130* 114* 101*  BUN 27* 33* 38* 31*  CREATININE 2.72* 3.41* 2.82* 1.94*  CALCIUM 9.3 8.4 8.0* 8.5   Liver Function Tests:  Recent Labs Lab 12/27/13 1552 12/28/13 0335  AST 31 34  ALT 18 22  ALKPHOS 211* 92  BILITOT 0.9 0.6  PROT 8.2 6.9  ALBUMIN 3.5 2.9*   No results found for this basename: LIPASE, AMYLASE,  in the last 168 hours No results found for this basename: AMMONIA,  in the last 168 hours CBC:  Recent Labs Lab 12/27/13 1715 12/28/13 0335 12/29/13 0515 12/30/13 0453  WBC 9.0 18.1* 14.8* 14.7*  HGB 13.0 12.7* 12.1* 12.2*  HCT 37.7* 38.3* 36.4* 37.6*  MCV 79.2 81.5 79.5 80.5  PLT 106* PLATELET CLUMPS NOTED ON SMEAR, COUNT APPEARS DECREASED 111* 119*   Cardiac Enzymes: No results found for this basename: CKTOTAL, CKMB, CKMBINDEX, TROPONINI,  in the last 168 hours BNP: BNP (last 3 results)  Recent Labs  12/27/13 1552  PROBNP 1097.0*   CBG:  Recent Labs Lab 12/27/13 1549  GLUCAP 123*    Signed:  Kysen Wetherington [name]  [facility] 12/30/2013, 1:41 PM

## 2014-01-03 ENCOUNTER — Other Ambulatory Visit: Payer: Self-pay | Admitting: Urology

## 2014-01-03 LAB — CULTURE, BLOOD (ROUTINE X 2)
CULTURE: NO GROWTH
Culture: NO GROWTH

## 2014-01-05 ENCOUNTER — Encounter (HOSPITAL_COMMUNITY)
Admission: RE | Admit: 2014-01-05 | Discharge: 2014-01-05 | Disposition: A | Discharge status: Home / Self Care | Payer: Federal, State, Local not specified - PPO | Source: Ambulatory Visit | Attending: Urology | Admitting: Urology

## 2014-01-05 ENCOUNTER — Encounter (HOSPITAL_COMMUNITY): Payer: Self-pay

## 2014-01-05 ENCOUNTER — Encounter (HOSPITAL_COMMUNITY): Payer: Self-pay | Admitting: Pharmacy Technician

## 2014-01-05 DIAGNOSIS — Z01810 Encounter for preprocedural cardiovascular examination: Secondary | ICD-10-CM | POA: Insufficient documentation

## 2014-01-05 DIAGNOSIS — Z01812 Encounter for preprocedural laboratory examination: Secondary | ICD-10-CM | POA: Insufficient documentation

## 2014-01-05 HISTORY — DX: Personal history of urinary calculi: Z87.442

## 2014-01-05 HISTORY — DX: Chronic kidney disease, unspecified: N18.9

## 2014-01-05 HISTORY — DX: Age-related osteoporosis without current pathological fracture: M81.0

## 2014-01-05 HISTORY — DX: Scar conditions and fibrosis of skin: L90.5

## 2014-01-05 LAB — CBC
HCT: 34.5 % — ABNORMAL LOW (ref 39.0–52.0)
Hemoglobin: 11.4 g/dL — ABNORMAL LOW (ref 13.0–17.0)
MCH: 26.1 pg (ref 26.0–34.0)
MCHC: 33 g/dL (ref 30.0–36.0)
MCV: 79.1 fL (ref 78.0–100.0)
Platelets: 357 10*3/uL (ref 150–400)
RBC: 4.36 MIL/uL (ref 4.22–5.81)
RDW: 15.5 % (ref 11.5–15.5)
WBC: 6.5 10*3/uL (ref 4.0–10.5)

## 2014-01-05 LAB — BASIC METABOLIC PANEL
BUN: 16 mg/dL (ref 6–23)
CO2: 26 mEq/L (ref 19–32)
Calcium: 8.6 mg/dL (ref 8.4–10.5)
Chloride: 103 mEq/L (ref 96–112)
Creatinine, Ser: 0.96 mg/dL (ref 0.50–1.35)
GFR calc non Af Amer: >90 mL/min (ref >90)
Glucose, Bld: 90 mg/dL (ref 70–99)
POTASSIUM: 3.8 meq/L (ref 3.7–5.3)
SODIUM: 140 meq/L (ref 137–147)

## 2014-01-05 NOTE — Patient Instructions (Addendum)
[name] [name]  01/05/2014                           YOUR PROCEDURE IS SCHEDULED ON: 01/20/14               PLEASE REPORT TO SHORT STAY CENTER AT : 5:30 AM               CALL THIS NUMBER IF ANY PROBLEMS THE DAY OF SURGERY :               [phone]                                REMEMBER:   Do not eat food or drink liquids AFTER MIDNIGHT                 Take these medicines the morning of surgery with A SIP OF WATER: OXYCODONE IF NEEDED FOR PAIN   Do not wear jewelry, make-up   Do not wear lotions, powders, or perfumes.   Do not shave legs or underarms 12 hrs. before surgery (men may shave face)  Do not bring valuables to the hospital.  Contacts, dentures or bridgework may not be worn into surgery.  Leave suitcase in the car. After surgery it may be brought to your room.  For patients admitted to the hospital more than one night, checkout time is            11:00 AM                                                       The day of discharge.   Patients discharged the day of surgery will not be allowed to drive home.            If going home same day of surgery, must have someone stay with you              FIRST 24 hrs at home and arrange for some one to drive you              home from hospital.    Special Instructions             Please read over the following fact sheets that you were given:               1. [name] PREPARING FOR SURGERY SHEET                                                X_____________________________________________________________________        Failure to follow these instructions may result in cancellation of your surgery

## 2014-01-19 MED ORDER — DEXTROSE 5 % IV SOLN
3.0000 g | INTRAVENOUS | Status: AC
  Administered 2014-01-20: 3 g via INTRAVENOUS
  Filled 2014-01-19 (×2): qty 3000

## 2014-01-19 NOTE — Anesthesia Preprocedure Evaluation (Signed)
Anesthesia Evaluation  Patient identified by MRN, date of birth, ID band Patient awake    Reviewed: Allergy & Precautions, H&P , NPO status , Patient's Chart, lab work & pertinent test results  Airway Mallampati: II TM Distance: >3 FB Neck ROM: Full    Dental no notable dental hx.    Pulmonary sleep apnea , former smoker,  breath sounds clear to auscultation  Pulmonary exam normal       Cardiovascular Exercise Tolerance: Good hypertension, Pt. on medications Rhythm:Regular Rate:Normal     Neuro/Psych negative neurological ROS  negative psych ROS   GI/Hepatic negative GI ROS, Neg liver ROS, GERD-  ,  Endo/Other  Morbid obesity  Renal/GU Renal disease     Musculoskeletal negative musculoskeletal ROS (+)   Abdominal (+) + obese,   Peds  Hematology  (+) anemia ,   Anesthesia Other Findings   Reproductive/Obstetrics negative OB ROS                           Anesthesia Physical  Anesthesia Plan  ASA: III  Anesthesia Plan: General   Post-op Pain Management:    Induction: Intravenous  Airway Management Planned: LMA  Additional Equipment:   Intra-op Plan:   Post-operative Plan: Extubation in OR  Informed Consent: I have reviewed the patients History and Physical, chart, labs and discussed the procedure including the risks, benefits and alternatives for the proposed anesthesia with the patient or authorized representative who has indicated his/her understanding and acceptance.   Dental advisory given  Plan Discussed with: CRNA  Anesthesia Plan Comments:         Anesthesia Quick Evaluation

## 2014-01-19 NOTE — H&P (Signed)
Urology History and Physical Exam  CC: Left nephrolithiasis. Left ureter stone.  HPI:  58 year old male presents today for a left ureter stone as well as left nephrolithiasis.  This was discovered on CT scan 12/27/13.  The ureter stones located in the proximal left ureter.  It is approximately once in meter in size.  It was associated with an Escherichia coli urinary tract infection.  He had a ureter stent placed 12/28/13.  Urinary tract infection was treated appropriately while he was in the hospital.  The left ureter stone was associated with hydronephrosis.  He also has bilateral nephrolithiasis.  He has a stone in the left lower pole which is 0.9 cm in size.  It is not obstructing.  Nothing makes it better or worse.  We have discussed management options along with risks, benefits, side effects, alternatives, and likelihood of achieving goals.  He presents today for cystoscopy, left ureteroscopy, laser lithotripsy, possible left retrograde pyelogram, and possible left ureter stent exchange.  Preoperative urine culture on 01/10/14 showed insignificant growth with 3000 colony-forming units, but because his stone has been associated with UTI, he was started on Keflex 4 days prior to surgery.  PMH: Past Medical History  Diagnosis Date  . Anemia   . Arthritis   . Spinal stenosis   . Morbid obesity   . Hypertension   . Prurigo 2002  . Osteoporosis   . Scars     ON ARMS FROM CHEMICAL EXPLOSION 1999  . History of kidney stones   . Obstructive sleep apnea     does not need c pap since 110 lb wt loss  . Chronic kidney disease     HX acute kidney failure / acute pyelonephritis / hydronephrosis / severe sepsis per discharge summary 12/27/13    PSH: Past Surgical History  Procedure Laterality Date  . Back surgery  01/23/2010    lumbar  . Laparoscopic gastric sleeve resection N/A 12/07/2012    Procedure: LAPAROSCOPIC GASTRIC SLEEVE RESECTION;  Surgeon: Lodema PilotBrian Layton, DO;  Location: [facility];  Service:  General;  Laterality: N/A;  laparoscopic sleeve gastrectomy with EGD  . Esophagogastroduodenoscopy N/A 12/07/2012    Procedure: ESOPHAGOGASTRODUODENOSCOPY (EGD);  Surgeon: Lodema PilotBrian Layton, DO;  Location: [facility];  Service: General;  Laterality: N/A;  . Cystoscopy with stent placement Left 12/28/2013    Procedure: CYSTOSCOPY WITH STENT PLACEMENT left retrograde pyleogram;  Surgeon: Milford Cageaniel Young Maxamilian Amadon, MD;  Location: [facility];  Service: Urology;  Laterality: Left;  . Circumcision      Allergies: Allergies  Allergen Reactions  . Unasyn [Ampicillin-Sulbactam Sodium] Hives    Medications: No prescriptions prior to admission     Social History: History   Social History  . Marital Status: Married    Spouse Name: N/A    Number of Children: N/A  . Years of Education: N/A   Occupational History  . Not on file.   Social History Main Topics  . Smoking status: Former Smoker    Types: Cigarettes    Quit date: 04/09/1974  . Smokeless tobacco: Never Used  . Alcohol Use: No  . Drug Use: No  . Sexual Activity: Not on file   Other Topics Concern  . Not on file   Social History Narrative  . No narrative on file    Family History: No family history on file.  Review of Systems: Positive: None. Negative: Fever, SOB, or chest pain.  A further 10 point review of systems was negative except what is listed in the  HPI.  Physical Exam: Filed Vitals:   01/20/14 0539  BP: 146/85  Pulse: 68  Temp: 97.8 F (36.6 C)  Resp: 18    General: No acute distress.  Awake. Head:  Normocephalic.  Atraumatic. ENT:  EOMI.  Mucous membranes moist Neck:  Supple.  No lymphadenopathy. CV:  S1 present. S2 present. Regular rate. Pulmonary: Equal effort bilaterally.  Clear to auscultation bilaterally. Abdomen: Soft.  Non- tender to palpation. Skin:  Normal turgor.  No visible rash. Extremity: No gross deformity of bilateral upper extremities.  No gross deformity of    bilateral lower  extremities. Neurologic: Alert. Appropriate mood.    Studies:  No results found for this basename: HGB, WBC, PLT,  in the last 72 hours  No results found for this basename: NA, K, CL, CO2, BUN, CREATININE, CALCIUM, MAGNESIUM, GFRNONAA, GFRAA,  in the last 72 hours   No results found for this basename: PT, INR, APTT,  in the last 72 hours   No components found with this basename: ABG,     Assessment:  Left nephrolithiasis. Left ureter stone.  Plan: To OR for cystoscopy, left ureteroscopy, laser lithotripsy, possible left retrograde pyelogram, and possible left ureter stent exchange.

## 2014-01-20 ENCOUNTER — Ambulatory Visit (HOSPITAL_COMMUNITY)
Admission: RE | Admit: 2014-01-20 | Discharge: 2014-01-20 | Disposition: A | Discharge status: Home / Self Care | Payer: Federal, State, Local not specified - PPO | Source: Ambulatory Visit | Attending: Urology | Admitting: Urology

## 2014-01-20 ENCOUNTER — Encounter (HOSPITAL_COMMUNITY): Payer: Federal, State, Local not specified - PPO | Admitting: Anesthesiology

## 2014-01-20 ENCOUNTER — Encounter (HOSPITAL_COMMUNITY): Payer: Self-pay | Admitting: *Deleted

## 2014-01-20 ENCOUNTER — Encounter (HOSPITAL_COMMUNITY)
Admission: RE | Disposition: A | Discharge status: Home / Self Care | Payer: Self-pay | Source: Ambulatory Visit | Attending: Urology

## 2014-01-20 ENCOUNTER — Ambulatory Visit (HOSPITAL_COMMUNITY): Payer: Federal, State, Local not specified - PPO | Admitting: Anesthesiology

## 2014-01-20 DIAGNOSIS — M810 Age-related osteoporosis without current pathological fracture: Secondary | ICD-10-CM | POA: Insufficient documentation

## 2014-01-20 DIAGNOSIS — N132 Hydronephrosis with renal and ureteral calculous obstruction: Secondary | ICD-10-CM

## 2014-01-20 DIAGNOSIS — K219 Gastro-esophageal reflux disease without esophagitis: Secondary | ICD-10-CM | POA: Insufficient documentation

## 2014-01-20 DIAGNOSIS — G4733 Obstructive sleep apnea (adult) (pediatric): Secondary | ICD-10-CM | POA: Insufficient documentation

## 2014-01-20 DIAGNOSIS — Z87891 Personal history of nicotine dependence: Secondary | ICD-10-CM | POA: Insufficient documentation

## 2014-01-20 DIAGNOSIS — I10 Essential (primary) hypertension: Secondary | ICD-10-CM | POA: Insufficient documentation

## 2014-01-20 DIAGNOSIS — N200 Calculus of kidney: Secondary | ICD-10-CM | POA: Insufficient documentation

## 2014-01-20 DIAGNOSIS — N201 Calculus of ureter: Secondary | ICD-10-CM | POA: Insufficient documentation

## 2014-01-20 DIAGNOSIS — D649 Anemia, unspecified: Secondary | ICD-10-CM | POA: Insufficient documentation

## 2014-01-20 DIAGNOSIS — Z87440 Personal history of urinary (tract) infections: Secondary | ICD-10-CM | POA: Insufficient documentation

## 2014-01-20 HISTORY — PX: CYSTOSCOPY WITH RETROGRADE PYELOGRAM, URETEROSCOPY AND STENT PLACEMENT: SHX5789

## 2014-01-20 HISTORY — PX: HOLMIUM LASER APPLICATION: SHX5852

## 2014-01-20 SURGERY — CYSTOURETEROSCOPY, WITH RETROGRADE PYELOGRAM AND STENT INSERTION
Anesthesia: General | Laterality: Left

## 2014-01-20 MED ORDER — PROMETHAZINE HCL 25 MG/ML IJ SOLN: 6.2500 mg | INTRAMUSCULAR | Status: DC | PRN

## 2014-01-20 MED ORDER — HYDROMORPHONE HCL PF 1 MG/ML IJ SOLN: 0.2500 mg | INTRAMUSCULAR | Status: DC | PRN

## 2014-01-20 MED ORDER — OXYCODONE HCL 5 MG PO TABS: 5.0000 mg | ORAL_TABLET | Freq: Once | ORAL | Status: DC | PRN

## 2014-01-20 MED ORDER — SODIUM CHLORIDE 0.9 % IJ SOLN
INTRAMUSCULAR | Status: AC
  Filled 2014-01-20: qty 10

## 2014-01-20 MED ORDER — PROPOFOL 10 MG/ML IV BOLUS
INTRAVENOUS | Status: DC | PRN
  Administered 2014-01-20: 200 mg via INTRAVENOUS

## 2014-01-20 MED ORDER — PROPOFOL 10 MG/ML IV BOLUS
INTRAVENOUS | Status: AC
  Filled 2014-01-20: qty 20

## 2014-01-20 MED ORDER — MIDAZOLAM HCL 5 MG/5ML IJ SOLN
INTRAMUSCULAR | Status: DC | PRN
  Administered 2014-01-20: 2 mg via INTRAVENOUS

## 2014-01-20 MED ORDER — ONDANSETRON HCL 4 MG/2ML IJ SOLN
INTRAMUSCULAR | Status: AC
  Filled 2014-01-20: qty 2

## 2014-01-20 MED ORDER — IOHEXOL 350 MG/ML SOLN
INTRAVENOUS | Status: DC | PRN
  Administered 2014-01-20: 15 mL

## 2014-01-20 MED ORDER — ONDANSETRON HCL 4 MG/2ML IJ SOLN
INTRAMUSCULAR | Status: DC | PRN
  Administered 2014-01-20: 4 mg via INTRAVENOUS

## 2014-01-20 MED ORDER — LIDOCAINE HCL 2 % EX GEL
CUTANEOUS | Status: DC | PRN
  Administered 2014-01-20: 1 via URETHRAL

## 2014-01-20 MED ORDER — BELLADONNA ALKALOIDS-OPIUM 16.2-60 MG RE SUPP
RECTAL | Status: DC | PRN
  Administered 2014-01-20: 1 via RECTAL

## 2014-01-20 MED ORDER — BELLADONNA ALKALOIDS-OPIUM 16.2-60 MG RE SUPP
RECTAL | Status: AC
  Filled 2014-01-20: qty 1

## 2014-01-20 MED ORDER — EPHEDRINE SULFATE 50 MG/ML IJ SOLN
INTRAMUSCULAR | Status: AC
  Filled 2014-01-20: qty 1

## 2014-01-20 MED ORDER — LIDOCAINE HCL 2 % EX GEL
CUTANEOUS | Status: AC
  Filled 2014-01-20: qty 10

## 2014-01-20 MED ORDER — MIDAZOLAM HCL 2 MG/2ML IJ SOLN
INTRAMUSCULAR | Status: AC
  Filled 2014-01-20: qty 2

## 2014-01-20 MED ORDER — LACTATED RINGERS IV SOLN
INTRAVENOUS | Status: DC | PRN
  Administered 2014-01-20: 07:00:00 via INTRAVENOUS

## 2014-01-20 MED ORDER — MEPERIDINE HCL 50 MG/ML IJ SOLN: 6.2500 mg | INTRAMUSCULAR | Status: DC | PRN

## 2014-01-20 MED ORDER — CIPROFLOXACIN HCL 500 MG PO TABS: 500.0000 mg | ORAL_TABLET | Freq: Two times a day (BID) | ORAL | Status: DC

## 2014-01-20 MED ORDER — FENTANYL CITRATE 0.05 MG/ML IJ SOLN
INTRAMUSCULAR | Status: DC | PRN
  Administered 2014-01-20: 25 ug via INTRAVENOUS
  Administered 2014-01-20: 50 ug via INTRAVENOUS
  Administered 2014-01-20: 25 ug via INTRAVENOUS

## 2014-01-20 MED ORDER — SODIUM CHLORIDE 0.9 % IR SOLN
Status: DC | PRN
Start: 2014-01-20 — End: 2014-01-20
  Administered 2014-01-20: 3000 mL via INTRAVESICAL

## 2014-01-20 MED ORDER — OXYCODONE HCL 5 MG/5ML PO SOLN
5.0000 mg | Freq: Once | ORAL | Status: DC | PRN
  Filled 2014-01-20: qty 5

## 2014-01-20 MED ORDER — SENNOSIDES-DOCUSATE SODIUM 8.6-50 MG PO TABS: 1.0000 | ORAL_TABLET | Freq: Two times a day (BID) | ORAL | Status: DC

## 2014-01-20 MED ORDER — LACTATED RINGERS IV SOLN: INTRAVENOUS | Status: DC

## 2014-01-20 MED ORDER — FENTANYL CITRATE 0.05 MG/ML IJ SOLN
INTRAMUSCULAR | Status: AC
  Filled 2014-01-20: qty 2

## 2014-01-20 MED ORDER — OXYCODONE HCL 5 MG PO TABS: 5.0000 mg | ORAL_TABLET | Freq: Four times a day (QID) | ORAL | Status: DC | PRN

## 2014-01-20 MED ORDER — LIDOCAINE HCL (CARDIAC) 20 MG/ML IV SOLN
INTRAVENOUS | Status: DC | PRN
  Administered 2014-01-20: 50 mg via INTRAVENOUS

## 2014-01-20 MED ORDER — EPHEDRINE SULFATE 50 MG/ML IJ SOLN
INTRAMUSCULAR | Status: DC | PRN
  Administered 2014-01-20 (×3): 10 mg via INTRAVENOUS

## 2014-01-20 SURGICAL SUPPLY — 33 items
BAG URO CATCHER STRL LF (DRAPE) ×3 IMPLANT
BASKET LASER NITINOL 1.9FR (BASKET) IMPLANT
BASKET STNLS GEMINI 4WIRE 3FR (BASKET) IMPLANT
BASKET ZERO TIP NITINOL 2.4FR (BASKET) IMPLANT
CATH CLEAR GEL 3F BACKSTOP (CATHETERS) IMPLANT
CATH URET 5FR 28IN CONE TIP (BALLOONS)
CATH URET 5FR 28IN OPEN ENDED (CATHETERS) ×3 IMPLANT
CATH URET 5FR 70CM CONE TIP (BALLOONS) IMPLANT
CATH URET DUAL LUMEN 6-10FR 50 (CATHETERS) IMPLANT
CLOTH BEACON ORANGE TIMEOUT ST (SAFETY) ×3 IMPLANT
DRAPE CAMERA CLOSED 9X96 (DRAPES) ×3 IMPLANT
ELECT REM PT RETURN 9FT ADLT (ELECTROSURGICAL) ×3
ELECTRODE REM PT RTRN 9FT ADLT (ELECTROSURGICAL) ×1 IMPLANT
EXTRACTOR STONE NITINOL NGAGE (UROLOGICAL SUPPLIES) ×3 IMPLANT
FIBER LASER FLEXIVA 200 (UROLOGICAL SUPPLIES) ×3 IMPLANT
FIBER LASER FLEXIVA 365 (UROLOGICAL SUPPLIES) IMPLANT
GLOVE BIOGEL M 7.0 STRL (GLOVE) ×3 IMPLANT
GOWN STRL REUS W/TWL LRG LVL3 (GOWN DISPOSABLE) ×3 IMPLANT
GOWN STRL REUS W/TWL XL LVL3 (GOWN DISPOSABLE) ×3 IMPLANT
GUIDEWIRE ANG ZIPWIRE 038X150 (WIRE) IMPLANT
GUIDEWIRE STR DUAL SENSOR (WIRE) ×3 IMPLANT
IV NS IRRIG 3000ML ARTHROMATIC (IV SOLUTION) ×3 IMPLANT
MANIFOLD NEPTUNE II (INSTRUMENTS) ×3 IMPLANT
PACK CYSTO (CUSTOM PROCEDURE TRAY) ×3 IMPLANT
SCRUB PCMX 4 OZ (MISCELLANEOUS) ×3 IMPLANT
SHEATH ACCESS URETERAL 38CM (SHEATH) IMPLANT
SHEATH ACCESS URETERAL 54CM (SHEATH) ×3 IMPLANT
SHEATH URET ACCESS 12FR/35CM (UROLOGICAL SUPPLIES) IMPLANT
SHEATH URET ACCESS 12FR/55CM (UROLOGICAL SUPPLIES) IMPLANT
STENT CONTOUR 6FRX28X.038 (STENTS) ×3 IMPLANT
SYRINGE IRR TOOMEY STRL 70CC (SYRINGE) IMPLANT
TUBING CONNECTING 10 (TUBING) ×2 IMPLANT
TUBING CONNECTING 10' (TUBING) ×1

## 2014-01-20 NOTE — Anesthesia Postprocedure Evaluation (Signed)
Anesthesia Post Note  Patient: [name] [name]  Procedure(s) Performed: Procedure(s) (LRB): CYSTOSCOPY WITH RETROGRADE PYELOGRAM, URETEROSCOPY AND STENT EXCHANGE (Left) HOLMIUM LASER APPLICATION (Left)  Anesthesia type: General  Patient location: PACU  Post pain: Pain level controlled  Post assessment: Post-op Vital signs reviewed  Last Vitals: BP 135/69  Pulse 60  Temp(Src) 36.3 C (Oral)  Resp 20  SpO2 99%  Post vital signs: Reviewed  Level of consciousness: sedated  Complications: No apparent anesthesia complications

## 2014-01-20 NOTE — Op Note (Signed)
Urology Operative Report  Date of Procedure: 01/20/14  Surgeon: Natalia Leatherwood, MD Assistant:  None  Preoperative Diagnosis: Left ureter stone. Left nephrolithiasis. Postoperative Diagnosis:  Same  Procedure(s): Left ureteroscopy with laser lithotripsy and stone removal. Left ureter stent placement (6 x 28, with tether). Left retrograde pyelogram with interpretation. Left ureter stent removal. Cystoscopy.  Estimated blood loss: Minimal  Specimen: Stones sent to [facility] lab for chemical analysis.  Drains: None  Complications: None  Findings: Left kidney stone had migrated into ureter behind the left ureter stone. No filling defects (other than stones) on retrograde pyelogram.  History of present illness: 58 year old male presents today for a left ureter stone and left nephrolithiasis. He presented to the hospital with urinary tract infection and required a left ureter stent placement. He presents today for ureteroscopy. His urinary tract infection has been treated appropriately and cleared.   Procedure in detail: After informed consent was obtained, the patient was taken to the operating room. They were placed in the supine position. SCDs were turned on and in place. IV antibiotics were infused, and general anesthesia was induced. A timeout was performed in which the correct patient, surgical site, and procedure were identified and agreed upon by the team.  The patient was placed in a dorsolithotomy position, making sure to pad all pertinent neurovascular pressure points. The genitals were prepped and draped in the usual sterile fashion.  A rigid cystoscope was best to the urethra and into the bladder. Attention was turned left ureter orifice. The stent was grasped with a grasper pulled the urethral meatus. A sensor wire was placed through this and up the left ureter and into the left renal pelvis. This was secured as a safety wire.  I obtained a left retrograde pyelogram by inserting  a 5 [place] ureter catheter into the left ureter and injecting 8 cc of Omnipaque. There was noted to be a filling defect consistent with the stone in the left mid ureter. Negative hydronephrosis.  I then placed a second sensor wire as a working wire up the left ureter. I placed a 12/14 ureter access sheath over the working wire into the left ureter under fluoroscopy with ease. The obturator and wire were removed.  I navigated the flexible digital ureter scope through the sheath and into the ureter. I then navigated this up into the proximal to mid ureter Y. encountered 2 stones.  Lithotripsy was carried out with a 200  holmium laser filament at 0.5 J and 20 Hz. Stone fragments were broken up into smaller pieces and then removed with a basket.  I then navigated the ureteroscope into the left renal pelvis and evaluated this in a systematic fashion to visualize the entire kidney. There was a stone fragment which had migrated up into the kidney and was removed with a basket. As noted, the left renal stone had migrated into the ureter and I perform lithotripsy on both the stones in the ureter.  I obtained another left retrograde pyelogram by positioning the left ureter scope into the left proximal ureter and injecting 10 cc of Omnipaque. No filling defects were noted. The upper pole the long infundibulum and did not drain as well as the rest of the calyces.  I then withdrew the ureter access sheath and ureter scope and visualized the entire surface of the ureter, this was negative for any injury. The mucosa remained intact.  I placed a ureter stent by loading the safety wire through a cystoscope and placing a 6 x  28 double-J stent with tethering place. This was deployed with a good curl in the left renal pelvis on fluoroscopy and a curl in the bladder. Stone fragments were washed out of the bladder.  There was an abrasion at the bladder neck noted from placing the cystoscope. Bugbee electrode was used to  gently fulgurate this area. There was good hemostasis.  I drained the bladder and removed the cystoscope. I placed 10 cc of lidocaine jelly into the urethra and the string was secured to the patient's penis. I placed a belladonna and opium suppository into the rectum. This completed the procedure, he was placed back in supine position, anesthesia was reversed, and he was taken to the PACU in stable condition.  All counts were correct at the end of the case.  He will remove the stent itself this coming Monday. He was given a prescription of ciprofloxacin to begin the day before stent removal.

## 2014-01-20 NOTE — Discharge Instructions (Signed)
DISCHARGE INSTRUCTIONS FOR KIDNEY STONES OR URETERAL STENT  MEDICATIONS:   1. Resume all your other meds from home.  ACTIVITY 1. No strenuous activity x 1week 2. No driving while on narcotic pain medications 3. Drink plenty of water 4. Continue to walk at home - you can still get blood clots when you are at home, so keep active, but don't over do it. 5. May return to work in 3 days.  BATHING 1. You can shower. 2. If you have a string coming from your urethra:  The stent string is attached to your ureteral stent.  Do not pull on this.  If the stent gets pulled our partially before it is time to remove it, go ahead and remove the entire stent.  Call if you develop significant pain that lasts more than an hour.    SIGNS/SYMPTOMS TO CALL: 1. Please call us if you have a fever greater than 101.5, uncontrolled  nausea/vomiting, uncontrolled pain, dizziness, unable to urinate, chest pain, shortness of breath, leg swelling, leg pain, redness around wound, drainage from wound, or any other concerns or questions.  You can reach us at [phone].  FOLLOW-UP 1. If you have a string attached to your stent, you may remove it on Monday, 01/24/14.  To do this, pull the string until the stent is completely removed.  You may feel an odd sensation in your back.

## 2014-01-20 NOTE — Transfer of Care (Signed)
Immediate Anesthesia Transfer of Care Note  Patient: [name] [name]  Procedure(s) Performed: Procedure(s) with comments: CYSTOSCOPY WITH RETROGRADE PYELOGRAM, URETEROSCOPY AND STENT EXCHANGE (Left) - bugbee bladder fulguration HOLMIUM LASER APPLICATION (Left)  Patient Location: PACU  Anesthesia Type:General  Level of Consciousness: awake, alert  and oriented  Airway & Oxygen Therapy: Patient Spontanous Breathing and Patient connected to face mask oxygen  Post-op Assessment: Report given to PACU RN and Post -op Vital signs reviewed and stable  Post vital signs: Reviewed and stable  Complications: No apparent anesthesia complications

## 2014-01-21 ENCOUNTER — Encounter (HOSPITAL_COMMUNITY): Payer: Self-pay | Admitting: Urology

## 2016-03-21 ENCOUNTER — Emergency Department (HOSPITAL_COMMUNITY): Payer: No Typology Code available for payment source

## 2016-03-21 ENCOUNTER — Encounter (HOSPITAL_COMMUNITY): Payer: Self-pay | Admitting: Emergency Medicine

## 2016-03-21 ENCOUNTER — Inpatient Hospital Stay (HOSPITAL_COMMUNITY)
Admission: EM | Admit: 2016-03-21 | Discharge: 2016-03-25 | DRG: 418 | Disposition: A | Discharge status: Home / Self Care | Payer: No Typology Code available for payment source | Attending: Surgery | Admitting: Surgery

## 2016-03-21 DIAGNOSIS — K8000 Calculus of gallbladder with acute cholecystitis without obstruction: Secondary | ICD-10-CM | POA: Diagnosis present

## 2016-03-21 DIAGNOSIS — Z880 Allergy status to penicillin: Secondary | ICD-10-CM

## 2016-03-21 DIAGNOSIS — K819 Cholecystitis, unspecified: Secondary | ICD-10-CM

## 2016-03-21 DIAGNOSIS — N189 Chronic kidney disease, unspecified: Secondary | ICD-10-CM | POA: Diagnosis present

## 2016-03-21 DIAGNOSIS — I129 Hypertensive chronic kidney disease with stage 1 through stage 4 chronic kidney disease, or unspecified chronic kidney disease: Secondary | ICD-10-CM | POA: Diagnosis present

## 2016-03-21 DIAGNOSIS — K8020 Calculus of gallbladder without cholecystitis without obstruction: Secondary | ICD-10-CM

## 2016-03-21 DIAGNOSIS — G4733 Obstructive sleep apnea (adult) (pediatric): Secondary | ICD-10-CM | POA: Diagnosis present

## 2016-03-21 DIAGNOSIS — M4800 Spinal stenosis, site unspecified: Secondary | ICD-10-CM | POA: Diagnosis present

## 2016-03-21 DIAGNOSIS — R1011 Right upper quadrant pain: Secondary | ICD-10-CM | POA: Diagnosis not present

## 2016-03-21 DIAGNOSIS — Z87891 Personal history of nicotine dependence: Secondary | ICD-10-CM

## 2016-03-21 DIAGNOSIS — R7989 Other specified abnormal findings of blood chemistry: Secondary | ICD-10-CM | POA: Diagnosis present

## 2016-03-21 DIAGNOSIS — K851 Biliary acute pancreatitis without necrosis or infection: Principal | ICD-10-CM | POA: Diagnosis present

## 2016-03-21 DIAGNOSIS — M810 Age-related osteoporosis without current pathological fracture: Secondary | ICD-10-CM | POA: Diagnosis present

## 2016-03-21 DIAGNOSIS — K858 Other acute pancreatitis without necrosis or infection: Secondary | ICD-10-CM

## 2016-03-21 DIAGNOSIS — Z87442 Personal history of urinary calculi: Secondary | ICD-10-CM

## 2016-03-21 DIAGNOSIS — Z9884 Bariatric surgery status: Secondary | ICD-10-CM

## 2016-03-21 LAB — CBC WITH DIFFERENTIAL/PLATELET
Basophils Absolute: 0 10*3/uL (ref 0.0–0.1)
Basophils Relative: 0 %
Eosinophils Absolute: 0 10*3/uL (ref 0.0–0.7)
Eosinophils Relative: 1 %
HCT: 43.6 % (ref 39.0–52.0)
Hemoglobin: 14.5 g/dL (ref 13.0–17.0)
LYMPHS PCT: 15 %
Lymphs Abs: 1.3 10*3/uL (ref 0.7–4.0)
MCH: 27.1 pg (ref 26.0–34.0)
MCHC: 33.3 g/dL (ref 30.0–36.0)
MCV: 81.5 fL (ref 78.0–100.0)
Monocytes Absolute: 0.8 10*3/uL (ref 0.1–1.0)
Monocytes Relative: 9 %
NEUTROS ABS: 6.8 10*3/uL (ref 1.7–7.7)
NEUTROS PCT: 75 %
Platelets: 221 10*3/uL (ref 150–400)
RBC: 5.35 MIL/uL (ref 4.22–5.81)
RDW: 15.5 % (ref 11.5–15.5)
WBC: 8.9 10*3/uL (ref 4.0–10.5)

## 2016-03-21 LAB — HEPATIC FUNCTION PANEL
ALT: 42 U/L (ref 17–63)
AST: 91 U/L — ABNORMAL HIGH (ref 15–41)
Albumin: 4.2 g/dL (ref 3.5–5.0)
Alkaline Phosphatase: 130 U/L — ABNORMAL HIGH (ref 38–126)
BILIRUBIN INDIRECT: 0.6 mg/dL (ref 0.3–0.9)
BILIRUBIN TOTAL: 1.6 mg/dL — AB (ref 0.3–1.2)
Bilirubin, Direct: 1 mg/dL — ABNORMAL HIGH (ref 0.1–0.5)
TOTAL PROTEIN: 7.6 g/dL (ref 6.5–8.1)

## 2016-03-21 LAB — BASIC METABOLIC PANEL
Anion gap: 7 (ref 5–15)
BUN: 16 mg/dL (ref 6–20)
CO2: 26 mmol/L (ref 22–32)
Calcium: 9 mg/dL (ref 8.9–10.3)
Chloride: 104 mmol/L (ref 101–111)
Creatinine, Ser: 0.88 mg/dL (ref 0.61–1.24)
GFR calc Af Amer: >60 mL/min (ref >60)
GFR calc non Af Amer: >60 mL/min (ref >60)
Glucose, Bld: 113 mg/dL — ABNORMAL HIGH (ref 65–99)
Potassium: 3.6 mmol/L (ref 3.5–5.1)
Sodium: 137 mmol/L (ref 135–145)

## 2016-03-21 LAB — CBC
HCT: 41.3 % (ref 39.0–52.0)
HEMOGLOBIN: 14.1 g/dL (ref 13.0–17.0)
MCH: 27.2 pg (ref 26.0–34.0)
MCHC: 34.1 g/dL (ref 30.0–36.0)
MCV: 79.6 fL (ref 78.0–100.0)
PLATELETS: 180 10*3/uL (ref 150–400)
RBC: 5.19 MIL/uL (ref 4.22–5.81)
RDW: 15.1 % (ref 11.5–15.5)
WBC: 7.6 10*3/uL (ref 4.0–10.5)

## 2016-03-21 LAB — URINALYSIS, ROUTINE W REFLEX MICROSCOPIC
Bilirubin Urine: NEGATIVE
GLUCOSE, UA: NEGATIVE mg/dL
Hgb urine dipstick: NEGATIVE
Ketones, ur: NEGATIVE mg/dL
Nitrite: NEGATIVE
PROTEIN: NEGATIVE mg/dL
Specific Gravity, Urine: 1.016 (ref 1.005–1.030)
pH: 6.5 (ref 5.0–8.0)

## 2016-03-21 LAB — URINE MICROSCOPIC-ADD ON

## 2016-03-21 LAB — LIPASE, BLOOD: LIPASE: 390 U/L — AB (ref 11–51)

## 2016-03-21 LAB — C-REACTIVE PROTEIN: CRP: 1.5 mg/dL — AB (ref <1.0)

## 2016-03-21 LAB — CREATININE, SERUM
CREATININE: 0.92 mg/dL (ref 0.61–1.24)
GFR calc Af Amer: >60 mL/min (ref >60)
GFR calc non Af Amer: >60 mL/min (ref >60)

## 2016-03-21 MED ORDER — ONDANSETRON HCL 4 MG/2ML IJ SOLN
4.0000 mg | Freq: Once | INTRAMUSCULAR | Status: AC
  Administered 2016-03-21: 4 mg via INTRAVENOUS
  Filled 2016-03-21: qty 2

## 2016-03-21 MED ORDER — METHOCARBAMOL 500 MG PO TABS: 500.0000 mg | ORAL_TABLET | Freq: Four times a day (QID) | ORAL | Status: DC | PRN

## 2016-03-21 MED ORDER — HYDROMORPHONE HCL 1 MG/ML IJ SOLN
1.0000 mg | Freq: Once | INTRAMUSCULAR | Status: AC
  Administered 2016-03-21: 1 mg via INTRAVENOUS
  Filled 2016-03-21: qty 1

## 2016-03-21 MED ORDER — ENOXAPARIN SODIUM 40 MG/0.4ML ~~LOC~~ SOLN
40.0000 mg | SUBCUTANEOUS | Status: DC
  Administered 2016-03-21 – 2016-03-24 (×4): 40 mg via SUBCUTANEOUS
  Filled 2016-03-21 (×6): qty 0.4

## 2016-03-21 MED ORDER — MORPHINE SULFATE (PF) 4 MG/ML IV SOLN
4.0000 mg | Freq: Once | INTRAVENOUS | Status: AC
  Administered 2016-03-21: 4 mg via INTRAVENOUS
  Filled 2016-03-21: qty 1

## 2016-03-21 MED ORDER — DIPHENHYDRAMINE HCL 50 MG/ML IJ SOLN
25.0000 mg | Freq: Four times a day (QID) | INTRAMUSCULAR | Status: DC | PRN
  Administered 2016-03-22 (×2): 25 mg via INTRAVENOUS
  Filled 2016-03-21 (×2): qty 1

## 2016-03-21 MED ORDER — HYDROMORPHONE HCL 1 MG/ML IJ SOLN
0.5000 mg | INTRAMUSCULAR | Status: DC | PRN
  Administered 2016-03-22 – 2016-03-25 (×7): 1 mg via INTRAVENOUS
  Filled 2016-03-21 (×7): qty 1

## 2016-03-21 MED ORDER — DIPHENHYDRAMINE HCL 25 MG PO CAPS: 25.0000 mg | ORAL_CAPSULE | Freq: Four times a day (QID) | ORAL | Status: DC | PRN

## 2016-03-21 MED ORDER — POTASSIUM CHLORIDE IN NACL 20-0.9 MEQ/L-% IV SOLN
INTRAVENOUS | Status: DC
  Administered 2016-03-21 – 2016-03-23 (×4): via INTRAVENOUS
  Filled 2016-03-21 (×10): qty 1000

## 2016-03-21 MED ORDER — HYDRALAZINE HCL 20 MG/ML IJ SOLN: 10.0000 mg | INTRAMUSCULAR | Status: DC | PRN

## 2016-03-21 MED ORDER — CIPROFLOXACIN IN D5W 400 MG/200ML IV SOLN
400.0000 mg | Freq: Two times a day (BID) | INTRAVENOUS | Status: DC
  Administered 2016-03-21 – 2016-03-25 (×9): 400 mg via INTRAVENOUS
  Filled 2016-03-21 (×9): qty 200

## 2016-03-21 MED ORDER — ONDANSETRON HCL 4 MG/2ML IJ SOLN
4.0000 mg | Freq: Four times a day (QID) | INTRAMUSCULAR | Status: DC | PRN
Start: 2016-03-21 — End: 2016-03-25

## 2016-03-21 NOTE — ED Provider Notes (Signed)
CSN: [id number]     Arrival date & time 03/21/16  2500 History   First MD Initiated Contact with Patient 03/21/16 [phone]     Chief Complaint  Patient presents with  . Flank Pain     (Consider location/radiation/quality/duration/timing/severity/associated sxs/prior Treatment) Patient is a 60 y.o. male presenting with flank pain. The history is provided by the patient and medical records.  Flank Pain   60 y.o. M with hx of anemia, arthritis, spinal stenosis, HTN, kidney stones requiring stenting, presenting to the ED for bilateral flank pain, R > L.  He states this began last night, has been persistent throughout the night.  States pain is sharp in nature, no radiation.  States he has associated nausea and vomiting.  No fever, chills, sweats.  States hard to get comfortable, could not sleep last night due to pain.  Hx of kidney stones in the past with similar symptoms.  States he did requiring stenting in the past for his kidney stones due to size.  No intervention tried PTA.  VSS.  Past Medical History  Diagnosis Date  . Anemia   . Arthritis   . Spinal stenosis   . Morbid obesity ([place])   . Hypertension   . Prurigo 2002  . Osteoporosis   . Scars     ON ARMS FROM CHEMICAL EXPLOSION 1999  . History of kidney stones   . Obstructive sleep apnea     does not need c pap since 110 lb wt loss  . Chronic kidney disease     HX acute kidney failure / acute pyelonephritis / hydronephrosis / severe sepsis per discharge summary 12/27/13   Past Surgical History  Procedure Laterality Date  . Back surgery  01/23/2010    lumbar  . Laparoscopic gastric sleeve resection N/A 12/07/2012    Procedure: LAPAROSCOPIC GASTRIC SLEEVE RESECTION;  Surgeon: Madilyn Hook, DO;  Location: [facility];  Service: General;  Laterality: N/A;  laparoscopic sleeve gastrectomy with EGD  . Esophagogastroduodenoscopy N/A 12/07/2012    Procedure: ESOPHAGOGASTRODUODENOSCOPY (EGD);  Surgeon: Madilyn Hook, DO;  Location: [facility];   Service: General;  Laterality: N/A;  . Cystoscopy with stent placement Left 12/28/2013    Procedure: CYSTOSCOPY WITH STENT PLACEMENT left retrograde pyleogram;  Surgeon: Molli Hazard, MD;  Location: [facility];  Service: Urology;  Laterality: Left;  . Circumcision    . Cystoscopy with retrograde pyelogram, ureteroscopy and stent placement Left 01/20/2014    Procedure: CYSTOSCOPY WITH RETROGRADE PYELOGRAM, URETEROSCOPY AND STENT EXCHANGE;  Surgeon: Molli Hazard, MD;  Location: [facility];  Service: Urology;  Laterality: Left;  bugbee bladder fulguration  . Holmium laser application Left 05/22/8915    Procedure: HOLMIUM LASER APPLICATION;  Surgeon: Molli Hazard, MD;  Location: [facility];  Service: Urology;  Laterality: Left;   History reviewed. No pertinent family history. Social History  Substance Use Topics  . Smoking status: Former Smoker    Types: Cigarettes    Quit date: 04/09/1974  . Smokeless tobacco: Never Used  . Alcohol Use: No    Review of Systems  Genitourinary: Positive for flank pain.  All other systems reviewed and are negative.     Allergies  Unasyn  Home Medications   Prior to Admission medications   Medication Sig Start Date End Date Taking? Authorizing Provider  acetaminophen (TYLENOL) 500 MG tablet Take 1,000 mg by mouth every 6 (six) hours as needed for mild pain or headache.    Historical Provider, MD  calcium citrate-vitamin D (  CITRACAL+D) 315-200 MG-UNIT per tablet Take 1 tablet by mouth 2 (two) times daily.     Historical Provider, MD  ciprofloxacin (CIPRO) 500 MG tablet Take 1 tablet (500 mg total) by mouth 2 (two) times daily. Begin on Monday, 01/23/14. 01/20/14   Rolan Bucco, MD  Cyanocobalamin (NASCOBAL) 500 MCG/0.1ML SOLN Place 500 mcg into right nostril every Saturday.    Historical Provider, MD  Multiple Vitamin (MULTIVITAMIN WITH MINERALS) TABS Take 1 tablet by mouth 2 (two) times daily.     Historical Provider, MD  oxyCODONE (OXY  IR/ROXICODONE) 5 MG immediate release tablet Take 1-2 tablets (5-10 mg total) by mouth every 6 (six) hours as needed for severe pain. 01/20/14   Rolan Bucco, MD  senna-docusate (SENOKOT S) 8.6-50 MG per tablet Take 1 tablet by mouth 2 (two) times daily. 01/20/14   Rolan Bucco, MD  vitamin B-12 (CYANOCOBALAMIN) 1000 MCG tablet Take 1,000 mcg by mouth daily.    Historical Provider, MD   BP 169/83 mmHg  Pulse 53  Resp 26  SpO2 97%   Physical Exam  Constitutional: He is oriented to person, place, and time. He appears well-developed and well-nourished. No distress.  Morbidly obese Appears uncomfortable, writhing in bed  HENT:  Head: Normocephalic and atraumatic.  Mouth/Throat: Oropharynx is clear and moist.  Eyes: Conjunctivae and EOM are normal. Pupils are equal, round, and reactive to light.  Neck: Normal range of motion.  Cardiovascular: Normal rate, regular rhythm and normal heart sounds.   Pulmonary/Chest: Effort normal and breath sounds normal. No respiratory distress. He has no wheezes.  Abdominal: Soft. Bowel sounds are normal. There is no tenderness. There is CVA tenderness. There is no guarding.  Bilateral CVA tenderness, R > L  Musculoskeletal: Normal range of motion.  Neurological: He is alert and oriented to person, place, and time.  Skin: Skin is warm and dry. He is not diaphoretic.  Psychiatric: He has a normal mood and affect.  Nursing note and vitals reviewed.   ED Course  Procedures (including critical care time) Labs Review Labs Reviewed  URINALYSIS, ROUTINE W REFLEX MICROSCOPIC (NOT AT [facility]) - Abnormal; Notable for the following:    Leukocytes, UA TRACE (*)    All other components within normal limits  BASIC METABOLIC PANEL - Abnormal; Notable for the following:    Glucose, Bld 113 (*)    All other components within normal limits  HEPATIC FUNCTION PANEL - Abnormal; Notable for the following:    AST 91 (*)    Alkaline Phosphatase 130 (*)    Total Bilirubin  1.6 (*)    Bilirubin, Direct 1.0 (*)    All other components within normal limits  LIPASE, BLOOD - Abnormal; Notable for the following:    Lipase 390 (*)    All other components within normal limits  URINE MICROSCOPIC-ADD ON - Abnormal; Notable for the following:    Squamous Epithelial / LPF 0-5 (*)    Bacteria, UA RARE (*)    All other components within normal limits  CBC WITH DIFFERENTIAL/PLATELET  CBC  CREATININE, SERUM  C-REACTIVE PROTEIN    Imaging Review US Abdomen Limited  03/21/2016  CLINICAL DATA:  Acute back pain, abnormal gallbladder on CT scan EXAM: US ABDOMEN LIMITED - RIGHT UPPER QUADRANT COMPARISON:  CT scan 03/21/2016 FINDINGS: Gallbladder: Gallstones are noted within gallbladder the largest measures 1.8 cm. There is thickening of gallbladder wall up to 6.6 mm. Findings are suspicious for acute cholecystitis and clinical correlation is necessary. Common bile  duct: Diameter: 10.4 mm in diameter prominent in size. Liver: No focal hepatic mass. Mild intrahepatic biliary ductal dilatation. Mild increased echogenicity of the liver suspicious for fatty infiltration. IMPRESSION: Gallstones are noted within gallbladder the largest measures 1.8 cm. There is thickening of gallbladder wall up to 6.6 mm. Findings are suspicious for acute cholecystitis and clinical correlation is necessary. Prominent size CBD measures 3.4 mm in diameter. Mild intrahepatic biliary ductal dilatation. Mild increased echogenicity of the liver suspicious for fatty infiltration. Electronically Signed   By: Lahoma Crocker M.D.   On: 03/21/2016 09:52   Ct Renal Stone Study  03/21/2016  CLINICAL DATA:  Right flank pain which began last evening. No urinary symptoms. Nausea and vomiting this morning. EXAM: CT ABDOMEN AND PELVIS WITHOUT CONTRAST TECHNIQUE: Multidetector CT imaging of the abdomen and pelvis was performed following the standard protocol without IV contrast. COMPARISON:  12/27/2013 FINDINGS: Lower chest: The lung  bases are clear of acute process. Minimal atelectasis. The heart is upper limits of normal in size. No pericardial effusion. The distal esophagus is grossly normal. Surgical changes involving the GE junction and stomach are noted. Probable partial gastrectomy. Hepatobiliary: Moderate periportal edema. The gallbladder is distended. No obvious gallstones. Possible inflammatory change around the gallbladder neck with inflammation gallbladder fossa. Could not exclude acute cholecystitis. No intra or extrahepatic biliary dilatation. Pancreas: No mass, inflammation or ductal dilatation. Spleen: Normal size.  No focal lesions. Adrenals/Urinary Tract: Nodularity of bilateral adrenal glands consistent with benign adenomas. There is a midpole right renal calculus but no obstructing ureteral calculi or bladder calculi. The left kidney is unremarkable. No hydronephrosis or renal mass. Stomach/Bowel: Stable surgical changes involving the stomach. The duodenum, small bowel and colon are grossly normal without oral contrast. No inflammatory changes of mass lesions or obstructive findings. The terminal ileum is normal. The appendix is normal. Vascular/Lymphatic: The aorta is normal in caliber. Stable atherosclerotic calcifications. Stable hazy interstitial change in the small bowel mesenteric and small scattered mesenteric lymph nodes no mass or overt adenopathy. Other: No pelvic mass or adenopathy. No free pelvic fluid collections. The bladder, prostate gland and seminal vesicles are unremarkable. No inguinal mass or adenopathy. Musculoskeletal: Stable advanced degenerative changes along the spine, particularly the facet joints. Evidence of previous lumbar surgery with decompressive laminectomies. IMPRESSION: 1. Findings suspicious for acute cholecystitis. Recommend right upper quadrant ultrasound examination for further evaluation. No intra or extrahepatic biliary dilatation. Moderate periportal edema. 2. Midpole right renal  calculus but no obstructing ureteral calculi or bladder calculi. 3. No obvious acute bowel process. 4. Stable surgical changes involving stomach and spine. Electronically Signed   By: Marijo Sanes M.D.   On: 03/21/2016 08:31   I have personally reviewed and evaluated these images and lab results as part of my medical decision-making.   EKG Interpretation None      MDM   Final diagnoses:  Cholecystitis  Gallstones  Other acute pancreatitis   60 year old male here with right flank pain which began last night. Reports nausea vomiting this morning. Patient appears uncomfortable but is in no acute distress. He does have right CVA and flank tenderness. Remainder of abdomen is overall benign. Will obtain labs, CT renal study. Dilaudid given for pain.  8:48 AM CT findings with concern for possible cholecystitis.  On repeat evaluation, patient moderately more comfortable but still reports pain.  He is not really tender in the RUQ, continues to be more so on his right flank/back which may be referred pain.  Will  obtain RUQ ultrasound.  Add hepatic function panel and lipase.  Morphine ordered.  Right upper quadrant ultrasound with gallstones noted as well as thickening of the gallbladder wall.  Alk phos, AST, and bili and lipase elevated, likely gallstone induced pancreatitis. Patient reports some continued improvement of his pain but still appears slightly uncomfortable. Additional medications ordered. Will discuss with general surgery.  General surgery has evaluated patient in the ED, will admit for further management.  Larene Pickett, PA-C 03/21/16 1242  Virgel Manifold, MD 03/22/16 [phone]

## 2016-03-21 NOTE — ED Notes (Signed)
Patient here with complaints of right flank pain 10/10 that started last night. Denies urinary symptoms. Nausea/vomiting this morning.

## 2016-03-21 NOTE — ED Notes (Signed)
Patient transported to CT 

## 2016-03-21 NOTE — H&P (Signed)
[facility] Admission Note  [name] [name] January 16, 1956  [id number].   Requesting MD: Dr. Wilson Singer Chief Complaint/Reason for Consult: Cholecystitis  HPI:  60 y/o morbidly obese male with hx of gastric sleeve 2014 Dr. Lilyan Punt, anemia, arthritis, spinal stenosis, HTN, kidney stones requiring stenting, presenting to [facility] for right flank pain, RUQ abdominal and epigastric abdominal pain radiating to the mid-back. He states this began 2 weeks ago, but at the time it was just back/flank pain, but the pain worsened yesterday and through the night.  States pain is sharp in nature.  States he has associated nausea and vomiting. No fever, chills, sweats. No CP/SOB, changes in bowel/bladder habits.  Does admit to dark urine.  States hard to get comfortable, he thought it was his kidney stones again.  States he did requiring stenting in the past for his kidney stones due to size. No precipitating/alleviating factors.  No other abdominal surgeries.  He has difficult mobilizing due to back pain from spinal stenosis and he walks with a walker.  Relatively good exercise tolerance.  He walks on the treadmill each day, but hasn't been able to for the last 2 weeks due to back pain.     ROS: All systems reviewed and otherwise negative except for as above  History reviewed. No pertinent family history.  Past Medical History  Diagnosis Date  . Anemia   . Arthritis   . Spinal stenosis   . Morbid obesity ([place])   . Hypertension   . Prurigo 2002  . Osteoporosis   . Scars     ON ARMS FROM CHEMICAL EXPLOSION 1999  . History of kidney stones   . Obstructive sleep apnea     does not need c pap since 110 lb wt loss  . Chronic kidney disease     HX acute kidney failure / acute pyelonephritis / hydronephrosis / severe sepsis per discharge summary 12/27/13    Past Surgical History  Procedure Laterality Date  . Back surgery  01/23/2010    lumbar  . Laparoscopic gastric sleeve resection N/A 12/07/2012     Procedure: LAPAROSCOPIC GASTRIC SLEEVE RESECTION;  Surgeon: Madilyn Hook, DO;  Location: [facility];  Service: General;  Laterality: N/A;  laparoscopic sleeve gastrectomy with EGD  . Esophagogastroduodenoscopy N/A 12/07/2012    Procedure: ESOPHAGOGASTRODUODENOSCOPY (EGD);  Surgeon: Madilyn Hook, DO;  Location: [facility];  Service: General;  Laterality: N/A;  . Cystoscopy with stent placement Left 12/28/2013    Procedure: CYSTOSCOPY WITH STENT PLACEMENT left retrograde pyleogram;  Surgeon: Molli Hazard, MD;  Location: [facility];  Service: Urology;  Laterality: Left;  . Circumcision    . Cystoscopy with retrograde pyelogram, ureteroscopy and stent placement Left 01/20/2014    Procedure: CYSTOSCOPY WITH RETROGRADE PYELOGRAM, URETEROSCOPY AND STENT EXCHANGE;  Surgeon: Molli Hazard, MD;  Location: [facility];  Service: Urology;  Laterality: Left;  bugbee bladder fulguration  . Holmium laser application Left 11/22/5282    Procedure: HOLMIUM LASER APPLICATION;  Surgeon: Molli Hazard, MD;  Location: [facility];  Service: Urology;  Laterality: Left;    Social History:  reports that he quit smoking about 41 years ago. His smoking use included Cigarettes. He has never used smokeless tobacco. He reports that he does not drink alcohol or use illicit drugs.  Allergies:  Allergies  Allergen Reactions  . Unasyn [Ampicillin-Sulbactam Sodium] Hives     (Not in a hospital admission)  Blood pressure 169/83, pulse 53, resp. rate 26, SpO2 97 %. Physical Exam: General:  pleasant, morbidly obese, WD/WN AA male who is laying in bed in NAD HEENT: head is normocephalic, atraumatic.  Sclera are noninjected.  PERRL.  Ears and nose without any masses or lesions.  Mouth is pink and moist Heart: regular, rate, and rhythm.  No obvious murmurs, gallops, or rubs noted.  Palpable pedal pulses bilaterally Lungs: CTAB, no wheezes, rhonchi, or rales noted.  Respiratory effort nonlabored Abd: Obese, soft, ND, tender in RUQ  and epigastrium, +BS, scars hard to see, large panus MS: all 4 extremities are symmetrical with no cyanosis, clubbing, or edema. Skin: warm and dry with no masses, lesions, or rashes Psych: A&Ox3 with an appropriate affect.   Results for orders placed or performed during the hospital encounter of 03/21/16 (from the past 48 hour(s))  CBC with Differential     Status: None   Collection Time: 03/21/16  7:54 AM  Result Value Ref Range   WBC 8.9 4.0 - 10.5 K/uL   RBC 5.35 4.22 - 5.81 MIL/uL   Hemoglobin 14.5 13.0 - 17.0 g/dL   HCT 43.6 39.0 - 52.0 %   MCV 81.5 78.0 - 100.0 fL   MCH 27.1 26.0 - 34.0 pg   MCHC 33.3 30.0 - 36.0 g/dL   RDW 15.5 11.5 - 15.5 %   Platelets 221 150 - 400 K/uL   Neutrophils Relative % 75 %   Neutro Abs 6.8 1.7 - 7.7 K/uL   Lymphocytes Relative 15 %   Lymphs Abs 1.3 0.7 - 4.0 K/uL   Monocytes Relative 9 %   Monocytes Absolute 0.8 0.1 - 1.0 K/uL   Eosinophils Relative 1 %   Eosinophils Absolute 0.0 0.0 - 0.7 K/uL   Basophils Relative 0 %   Basophils Absolute 0.0 0.0 - 0.1 K/uL  Basic metabolic panel     Status: Abnormal   Collection Time: 03/21/16  8:30 AM  Result Value Ref Range   Sodium 137 135 - 145 mmol/L   Potassium 3.6 3.5 - 5.1 mmol/L   Chloride 104 101 - 111 mmol/L   CO2 26 22 - 32 mmol/L   Glucose, Bld 113 (H) 65 - 99 mg/dL   BUN 16 6 - 20 mg/dL   Creatinine, Ser 0.88 0.61 - 1.24 mg/dL   Calcium 9.0 8.9 - 10.3 mg/dL   GFR calc non Af Amer >60 >60 mL/min   GFR calc Af Amer >60 >60 mL/min    Comment: (NOTE) The eGFR has been calculated using the CKD EPI equation. This calculation has not been validated in all clinical situations. eGFR's persistently <60 mL/min signify possible Chronic Kidney Disease.    Anion gap 7 5 - 15  Urinalysis, Routine w reflex microscopic (not at [facility])     Status: Abnormal   Collection Time: 03/21/16  8:35 AM  Result Value Ref Range   Color, Urine YELLOW YELLOW   APPearance CLEAR CLEAR   Specific Gravity, Urine  1.016 1.005 - 1.030   pH 6.5 5.0 - 8.0   Glucose, UA NEGATIVE NEGATIVE mg/dL   Hgb urine dipstick NEGATIVE NEGATIVE   Bilirubin Urine NEGATIVE NEGATIVE   Ketones, ur NEGATIVE NEGATIVE mg/dL   Protein, ur NEGATIVE NEGATIVE mg/dL   Nitrite NEGATIVE NEGATIVE   Leukocytes, UA TRACE (A) NEGATIVE  Urine microscopic-add on     Status: Abnormal   Collection Time: 03/21/16  8:35 AM  Result Value Ref Range   Squamous Epithelial / LPF 0-5 (A) NONE SEEN   WBC, UA 0-5 0 - 5 WBC/hpf  RBC / HPF 0-5 0 - 5 RBC/hpf   Bacteria, UA RARE (A) NONE SEEN   Urine-Other MUCOUS PRESENT   Hepatic function panel     Status: Abnormal   Collection Time: 03/21/16  8:41 AM  Result Value Ref Range   Total Protein 7.6 6.5 - 8.1 g/dL   Albumin 4.2 3.5 - 5.0 g/dL   AST 91 (H) 15 - 41 U/L   ALT 42 17 - 63 U/L   Alkaline Phosphatase 130 (H) 38 - 126 U/L   Total Bilirubin 1.6 (H) 0.3 - 1.2 mg/dL   Bilirubin, Direct 1.0 (H) 0.1 - 0.5 mg/dL   Indirect Bilirubin 0.6 0.3 - 0.9 mg/dL  Lipase, blood     Status: Abnormal   Collection Time: 03/21/16  8:41 AM  Result Value Ref Range   Lipase 390 (H) 11 - 51 U/L   US Abdomen Limited  03/21/2016  CLINICAL DATA:  Acute back pain, abnormal gallbladder on CT scan EXAM: US ABDOMEN LIMITED - RIGHT UPPER QUADRANT COMPARISON:  CT scan 03/21/2016 FINDINGS: Gallbladder: Gallstones are noted within gallbladder the largest measures 1.8 cm. There is thickening of gallbladder wall up to 6.6 mm. Findings are suspicious for acute cholecystitis and clinical correlation is necessary. Common bile duct: Diameter: 10.4 mm in diameter prominent in size. Liver: No focal hepatic mass. Mild intrahepatic biliary ductal dilatation. Mild increased echogenicity of the liver suspicious for fatty infiltration. IMPRESSION: Gallstones are noted within gallbladder the largest measures 1.8 cm. There is thickening of gallbladder wall up to 6.6 mm. Findings are suspicious for acute cholecystitis and clinical  correlation is necessary. Prominent size CBD measures 3.4 mm in diameter. Mild intrahepatic biliary ductal dilatation. Mild increased echogenicity of the liver suspicious for fatty infiltration. Electronically Signed   By: Lahoma Crocker M.D.   On: 03/21/2016 09:52   Ct Renal Stone Study  03/21/2016  CLINICAL DATA:  Right flank pain which began last evening. No urinary symptoms. Nausea and vomiting this morning. EXAM: CT ABDOMEN AND PELVIS WITHOUT CONTRAST TECHNIQUE: Multidetector CT imaging of the abdomen and pelvis was performed following the standard protocol without IV contrast. COMPARISON:  12/27/2013 FINDINGS: Lower chest: The lung bases are clear of acute process. Minimal atelectasis. The heart is upper limits of normal in size. No pericardial effusion. The distal esophagus is grossly normal. Surgical changes involving the GE junction and stomach are noted. Probable partial gastrectomy. Hepatobiliary: Moderate periportal edema. The gallbladder is distended. No obvious gallstones. Possible inflammatory change around the gallbladder neck with inflammation gallbladder fossa. Could not exclude acute cholecystitis. No intra or extrahepatic biliary dilatation. Pancreas: No mass, inflammation or ductal dilatation. Spleen: Normal size.  No focal lesions. Adrenals/Urinary Tract: Nodularity of bilateral adrenal glands consistent with benign adenomas. There is a midpole right renal calculus but no obstructing ureteral calculi or bladder calculi. The left kidney is unremarkable. No hydronephrosis or renal mass. Stomach/Bowel: Stable surgical changes involving the stomach. The duodenum, small bowel and colon are grossly normal without oral contrast. No inflammatory changes of mass lesions or obstructive findings. The terminal ileum is normal. The appendix is normal. Vascular/Lymphatic: The aorta is normal in caliber. Stable atherosclerotic calcifications. Stable hazy interstitial change in the small bowel mesenteric and  small scattered mesenteric lymph nodes no mass or overt adenopathy. Other: No pelvic mass or adenopathy. No free pelvic fluid collections. The bladder, prostate gland and seminal vesicles are unremarkable. No inguinal mass or adenopathy. Musculoskeletal: Stable advanced degenerative changes along the spine, particularly the  facet joints. Evidence of previous lumbar surgery with decompressive laminectomies. IMPRESSION: 1. Findings suspicious for acute cholecystitis. Recommend right upper quadrant ultrasound examination for further evaluation. No intra or extrahepatic biliary dilatation. Moderate periportal edema. 2. Midpole right renal calculus but no obstructing ureteral calculi or bladder calculi. 3. No obvious acute bowel process. 4. Stable surgical changes involving stomach and spine. Electronically Signed   By: Marijo Sanes M.D.   On: 03/21/2016 08:31      Assessment/Plan Acute calculous cholecystitis Gallstone pancreatitis  Plan: 1.  Admit to [facility] 2.  NPO, bowel rest, IVF, pain control, antiemetics, antibiotics (cipro) 3.  Medical management of pancreatitis for now, eventual lap chole this admission  Nat Christen, [facility] 03/21/2016, 10:45 AM Pager: [phone] (7am - 4:30pm M-F; 7am - 11:30am Sa/Su)

## 2016-03-22 DIAGNOSIS — I129 Hypertensive chronic kidney disease with stage 1 through stage 4 chronic kidney disease, or unspecified chronic kidney disease: Secondary | ICD-10-CM | POA: Diagnosis present

## 2016-03-22 DIAGNOSIS — Z87442 Personal history of urinary calculi: Secondary | ICD-10-CM | POA: Diagnosis not present

## 2016-03-22 DIAGNOSIS — G4733 Obstructive sleep apnea (adult) (pediatric): Secondary | ICD-10-CM | POA: Diagnosis present

## 2016-03-22 DIAGNOSIS — R1011 Right upper quadrant pain: Secondary | ICD-10-CM | POA: Diagnosis present

## 2016-03-22 DIAGNOSIS — R7989 Other specified abnormal findings of blood chemistry: Secondary | ICD-10-CM | POA: Diagnosis present

## 2016-03-22 DIAGNOSIS — Z87891 Personal history of nicotine dependence: Secondary | ICD-10-CM | POA: Diagnosis not present

## 2016-03-22 DIAGNOSIS — N189 Chronic kidney disease, unspecified: Secondary | ICD-10-CM | POA: Diagnosis present

## 2016-03-22 DIAGNOSIS — K851 Biliary acute pancreatitis without necrosis or infection: Secondary | ICD-10-CM | POA: Diagnosis present

## 2016-03-22 DIAGNOSIS — Z9884 Bariatric surgery status: Secondary | ICD-10-CM | POA: Diagnosis not present

## 2016-03-22 DIAGNOSIS — Z880 Allergy status to penicillin: Secondary | ICD-10-CM | POA: Diagnosis not present

## 2016-03-22 DIAGNOSIS — M4800 Spinal stenosis, site unspecified: Secondary | ICD-10-CM | POA: Diagnosis present

## 2016-03-22 DIAGNOSIS — K8000 Calculus of gallbladder with acute cholecystitis without obstruction: Secondary | ICD-10-CM | POA: Diagnosis present

## 2016-03-22 DIAGNOSIS — M810 Age-related osteoporosis without current pathological fracture: Secondary | ICD-10-CM | POA: Diagnosis present

## 2016-03-22 LAB — COMPREHENSIVE METABOLIC PANEL
ALK PHOS: 124 U/L (ref 38–126)
ALT: 38 U/L (ref 17–63)
AST: 44 U/L — ABNORMAL HIGH (ref 15–41)
Albumin: 3.8 g/dL (ref 3.5–5.0)
Anion gap: 6 (ref 5–15)
BILIRUBIN TOTAL: 2.2 mg/dL — AB (ref 0.3–1.2)
BUN: 14 mg/dL (ref 6–20)
CALCIUM: 8.6 mg/dL — AB (ref 8.9–10.3)
CO2: 26 mmol/L (ref 22–32)
CREATININE: 1.05 mg/dL (ref 0.61–1.24)
Chloride: 107 mmol/L (ref 101–111)
GFR calc non Af Amer: >60 mL/min (ref >60)
GLUCOSE: 120 mg/dL — AB (ref 65–99)
Potassium: 3.8 mmol/L (ref 3.5–5.1)
SODIUM: 139 mmol/L (ref 135–145)
Total Protein: 7.1 g/dL (ref 6.5–8.1)

## 2016-03-22 LAB — CBC
HCT: 41.2 % (ref 39.0–52.0)
Hemoglobin: 13.4 g/dL (ref 13.0–17.0)
MCH: 26.6 pg (ref 26.0–34.0)
MCHC: 32.5 g/dL (ref 30.0–36.0)
MCV: 81.7 fL (ref 78.0–100.0)
PLATELETS: 200 10*3/uL (ref 150–400)
RBC: 5.04 MIL/uL (ref 4.22–5.81)
RDW: 15.6 % — ABNORMAL HIGH (ref 11.5–15.5)
WBC: 7.4 10*3/uL (ref 4.0–10.5)

## 2016-03-22 LAB — LIPASE, BLOOD: LIPASE: 1217 U/L — AB (ref 11–51)

## 2016-03-22 NOTE — Progress Notes (Signed)
[facility] Progress Note     Subjective: Pt still having pain, but overall a bit better today.  NPO.  No N/V.  No Bm since 03/20/16.    Objective: Vital signs in last 24 hours: Temp:  [98.3 F (36.8 C)-99.2 F (37.3 C)] 98.6 F (37 C) (06/09 0508) Pulse Rate:  [56-86] 73 (06/09 0134) Resp:  [18-20] 20 (06/09 0508) BP: (109-149)/(68-85) 109/68 mmHg (06/09 0508) SpO2:  [96 %-100 %] 97 % (06/09 0508) Last BM Date: 03/20/16  Intake/Output from previous day: 06/08 0701 - 06/09 0700 In: 1405 [I.V.:1005; IV Piggyback:400] Out: 475 [Urine:475] Intake/Output this shift:    PE: Gen:  Alert, NAD, pleasant Abd: Obese, soft, distended, tender to palpation in epigastrium/RUQ, +BS, no HSM   Lab Results:   Recent Labs  03/21/16 1150 03/22/16 0515  WBC 7.6 7.4  HGB 14.1 13.4  HCT 41.3 41.2  PLT 180 200   BMET  Recent Labs  03/21/16 0830 03/21/16 1150 03/22/16 0515  NA 137  --  139  K 3.6  --  3.8  CL 104  --  107  CO2 26  --  26  GLUCOSE 113*  --  120*  BUN 16  --  14  CREATININE 0.88 0.92 1.05  CALCIUM 9.0  --  8.6*   PT/INR No results for input(s): LABPROT, INR in the last 72 hours. CMP     Component Value Date/Time   NA 139 03/22/2016 0515   K 3.8 03/22/2016 0515   CL 107 03/22/2016 0515   CO2 26 03/22/2016 0515   GLUCOSE 120* 03/22/2016 0515   BUN 14 03/22/2016 0515   CREATININE 1.05 03/22/2016 0515   CREATININE 0.86 03/26/2013 0907   CALCIUM 8.6* 03/22/2016 0515   PROT 7.1 03/22/2016 0515   ALBUMIN 3.8 03/22/2016 0515   AST 44* 03/22/2016 0515   ALT 38 03/22/2016 0515   ALKPHOS 124 03/22/2016 0515   BILITOT 2.2* 03/22/2016 0515   GFRNONAA >60 03/22/2016 0515   GFRAA >60 03/22/2016 0515   Lipase     Component Value Date/Time   LIPASE 1217* 03/22/2016 0515       Studies/Results: US Abdomen Limited  03/21/2016  CLINICAL DATA:  Acute back pain, abnormal gallbladder on CT scan EXAM: US ABDOMEN LIMITED - RIGHT UPPER QUADRANT  COMPARISON:  CT scan 03/21/2016 FINDINGS: Gallbladder: Gallstones are noted within gallbladder the largest measures 1.8 cm. There is thickening of gallbladder wall up to 6.6 mm. Findings are suspicious for acute cholecystitis and clinical correlation is necessary. Common bile duct: Diameter: 10.4 mm in diameter prominent in size. Liver: No focal hepatic mass. Mild intrahepatic biliary ductal dilatation. Mild increased echogenicity of the liver suspicious for fatty infiltration. IMPRESSION: Gallstones are noted within gallbladder the largest measures 1.8 cm. There is thickening of gallbladder wall up to 6.6 mm. Findings are suspicious for acute cholecystitis and clinical correlation is necessary. Prominent size CBD measures 3.4 mm in diameter. Mild intrahepatic biliary ductal dilatation. Mild increased echogenicity of the liver suspicious for fatty infiltration. Electronically Signed   By: Natasha Mead M.D.   On: 03/21/2016 09:52   Ct Renal Stone Study  03/21/2016  CLINICAL DATA:  Right flank pain which began last evening. No urinary symptoms. Nausea and vomiting this morning. EXAM: CT ABDOMEN AND PELVIS WITHOUT CONTRAST TECHNIQUE: Multidetector CT imaging of the abdomen and pelvis was performed following the standard protocol without IV contrast. COMPARISON:  12/27/2013 FINDINGS: Lower chest: The lung bases are clear of  acute process. Minimal atelectasis. The heart is upper limits of normal in size. No pericardial effusion. The distal esophagus is grossly normal. Surgical changes involving the GE junction and stomach are noted. Probable partial gastrectomy. Hepatobiliary: Moderate periportal edema. The gallbladder is distended. No obvious gallstones. Possible inflammatory change around the gallbladder neck with inflammation gallbladder fossa. Could not exclude acute cholecystitis. No intra or extrahepatic biliary dilatation. Pancreas: No mass, inflammation or ductal dilatation. Spleen: Normal size.  No focal  lesions. Adrenals/Urinary Tract: Nodularity of bilateral adrenal glands consistent with benign adenomas. There is a midpole right renal calculus but no obstructing ureteral calculi or bladder calculi. The left kidney is unremarkable. No hydronephrosis or renal mass. Stomach/Bowel: Stable surgical changes involving the stomach. The duodenum, small bowel and colon are grossly normal without oral contrast. No inflammatory changes of mass lesions or obstructive findings. The terminal ileum is normal. The appendix is normal. Vascular/Lymphatic: The aorta is normal in caliber. Stable atherosclerotic calcifications. Stable hazy interstitial change in the small bowel mesenteric and small scattered mesenteric lymph nodes no mass or overt adenopathy. Other: No pelvic mass or adenopathy. No free pelvic fluid collections. The bladder, prostate gland and seminal vesicles are unremarkable. No inguinal mass or adenopathy. Musculoskeletal: Stable advanced degenerative changes along the spine, particularly the facet joints. Evidence of previous lumbar surgery with decompressive laminectomies. IMPRESSION: 1. Findings suspicious for acute cholecystitis. Recommend right upper quadrant ultrasound examination for further evaluation. No intra or extrahepatic biliary dilatation. Moderate periportal edema. 2. Midpole right renal calculus but no obstructing ureteral calculi or bladder calculi. 3. No obvious acute bowel process. 4. Stable surgical changes involving stomach and spine. Electronically Signed   By: Rudie MeyerP.  Gallerani M.D.   On: 03/21/2016 08:31    Anti-infectives: Anti-infectives    Start     Dose/Rate Route Frequency Ordered Stop   03/21/16 1130  ciprofloxacin (CIPRO) IVPB 400 mg     400 mg 200 mL/hr over 60 Minutes Intravenous Every 12 hours 03/21/16 1121         Assessment/Plan Acute calculous cholecystitis Gallstone pancreatitis CBD and hepatic ductal dilatation H/o gastric sleeve - Dr. Biagio QuintLayton 11/2012  Plan: 1.  Admit to [facility] 2. NPO, bowel rest, IVF, pain control, antiemetics, antibiotics (cipro) 3. Medical management of pancreatitis for now, eventual lap chole this admission 4.  Lipase went up to 1217, CRP is 1.5, will need to wait until pancreatitis resolved before lap chole is considered. 5.  Bili went up to 2.2, will consult GI for consideration of MRCP vs ERCP given CBD dilatation and hepatic ductal dilatation.       Nonie HoyerMegan N Mionna Advincula 03/22/2016, 8:09 AM Pager: [phone]  (7am - 4:30pm M-F; 7am - 11:30am Sa/Su)

## 2016-03-22 NOTE — Consult Note (Signed)
[facility] Consultation Note  Referring Provider: Luretha Murphy, MD   Primary Care Physician:  Katy Apo, MD  Reason for Consultation:  Gallstone pancreatitis  HPI: [name] [name] is a 60 y.o. male admitted for right upper quadrant pain and back pain.  Initially thought symptoms were from his spinal stenosis.  Upon evaluation, found to have gallstones, possible cholecystitis, and pancreatitis.  Patient's pain has improved today compared to yesterday.  No nausea, vomiting, blood in stool.  Does have intermittent GERD symptoms.  History of gastric sleeve few years ago.     Past Medical History  Diagnosis Date  . Anemia   . Arthritis   . Spinal stenosis   . Morbid obesity (HCC)   . Hypertension   . Prurigo 2002  . Osteoporosis   . Scars     ON ARMS FROM CHEMICAL EXPLOSION 1999  . History of kidney stones   . Obstructive sleep apnea     does not need c pap since 110 lb wt loss  . Chronic kidney disease     HX acute kidney failure / acute pyelonephritis / hydronephrosis / severe sepsis per discharge summary 12/27/13    Past Surgical History  Procedure Laterality Date  . Back surgery  01/23/2010    lumbar  . Laparoscopic gastric sleeve resection N/A 12/07/2012    Procedure: LAPAROSCOPIC GASTRIC SLEEVE RESECTION;  Surgeon: Lodema Pilot, DO;  Location: [facility];  Service: General;  Laterality: N/A;  laparoscopic sleeve gastrectomy with EGD  . Esophagogastroduodenoscopy N/A 12/07/2012    Procedure: ESOPHAGOGASTRODUODENOSCOPY (EGD);  Surgeon: Lodema Pilot, DO;  Location: [facility];  Service: General;  Laterality: N/A;  . Cystoscopy with stent placement Left 12/28/2013    Procedure: CYSTOSCOPY WITH STENT PLACEMENT left retrograde pyleogram;  Surgeon: Milford Cage, MD;  Location: [facility];  Service: Urology;  Laterality: Left;  . Circumcision    . Cystoscopy with retrograde pyelogram, ureteroscopy and stent placement Left 01/20/2014    Procedure: CYSTOSCOPY WITH RETROGRADE  PYELOGRAM, URETEROSCOPY AND STENT EXCHANGE;  Surgeon: Milford Cage, MD;  Location: [facility];  Service: Urology;  Laterality: Left;  bugbee bladder fulguration  . Holmium laser application Left 01/20/2014    Procedure: HOLMIUM LASER APPLICATION;  Surgeon: Milford Cage, MD;  Location: [facility];  Service: Urology;  Laterality: Left;    Prior to Admission medications   Medication Sig Start Date End Date Taking? Authorizing Provider  acetaminophen (TYLENOL) 500 MG tablet Take 1,000 mg by mouth every 6 (six) hours as needed for mild pain or headache.   Yes Historical Provider, MD  calcium citrate-vitamin D (CITRACAL+D) 315-200 MG-UNIT per tablet Take 1 tablet by mouth 2 (two) times daily.    Yes Historical Provider, MD  CALCIUM PO Take 1 tablet by mouth daily.   Yes Historical Provider, MD  HYDROcodone-acetaminophen (NORCO/VICODIN) 5-325 MG tablet Take 1 tablet by mouth every 12 (twelve) hours as needed. pain 03/04/16  Yes Historical Provider, MD  ibuprofen (ADVIL,MOTRIN) 200 MG tablet Take 200-400 mg by mouth every 6 (six) hours as needed for moderate pain.   Yes Historical Provider, MD  Multiple Vitamin (MULTIVITAMIN WITH MINERALS) TABS Take 1 tablet by mouth daily.    Yes Historical Provider, MD  SYNVISC ONE 48 MG/6ML SOSY Inject 6 mLs into the skin once. Received on 03/15/2016 03/15/16  Yes Historical Provider, MD  vitamin B-12 (CYANOCOBALAMIN) 1000 MCG tablet Take 1,000 mcg by mouth daily.   Yes Historical Provider, MD    Current Facility-Administered Medications  Medication Dose Route Frequency Provider Last Rate Last Dose  . 0.9 % NaCl with KCl 20 mEq/ L  infusion   Intravenous Continuous Nonie HoyerMegan N Baird, PA-C 75 mL/hr at 03/21/16 1636    . ciprofloxacin (CIPRO) IVPB 400 mg  400 mg Intravenous Q12H Nonie HoyerMegan N Baird, PA-C 200 mL/hr at 03/21/16 2239 400 mg at 03/21/16 2239  . diphenhydrAMINE (BENADRYL) capsule 25 mg  25 mg Oral Q6H PRN Nonie HoyerMegan N Baird, PA-C       Or  . diphenhydrAMINE  (BENADRYL) injection 25 mg  25 mg Intravenous Q6H PRN Nonie HoyerMegan N Baird, PA-C   25 mg at 03/22/16 0515  . enoxaparin (LOVENOX) injection 40 mg  40 mg Subcutaneous Q24H Nonie HoyerMegan N Baird, PA-C   40 mg at 03/21/16 1636  . hydrALAZINE (APRESOLINE) injection 10 mg  10 mg Intravenous Q2H PRN Nonie HoyerMegan N Baird, PA-C      . HYDROmorphone (DILAUDID) injection 0.5-1 mg  0.5-1 mg Intravenous Q2H PRN Nonie HoyerMegan N Baird, PA-C   1 mg at 03/22/16 0505  . methocarbamol (ROBAXIN) tablet 500 mg  500 mg Oral Q6H PRN Nonie HoyerMegan N Baird, PA-C      . ondansetron [facility](ZOFRAN) injection 4 mg  4 mg Intravenous Q6H PRN Nonie HoyerMegan N Baird, PA-C        Allergies as of 03/21/2016 - Review Complete 03/21/2016  Allergen Reaction Noted  . Unasyn [ampicillin-sulbactam sodium] Hives, Itching, Swelling, and Rash 01/05/2014    History reviewed. No pertinent family history.  Social History   Social History  . Marital Status: Married    Spouse Name: N/A  . Number of Children: N/A  . Years of Education: N/A   Occupational History  . Not on file.   Social History Main Topics  . Smoking status: Former Smoker    Types: Cigarettes    Quit date: 04/09/1974  . Smokeless tobacco: Never Used  . Alcohol Use: No  . Drug Use: No  . Sexual Activity: Not on file   Other Topics Concern  . Not on file   Social History Narrative    Review of Systems: As per HPI, all others negative  Physical Exam: Vital signs in last 24 hours: Temp:  [98.3 F (36.8 C)-99.2 F (37.3 C)] 98.6 F (37 C) (06/09 0508) Pulse Rate:  [56-86] 73 (06/09 0134) Resp:  [18-20] 20 (06/09 0508) BP: (109-149)/(68-85) 109/68 mmHg (06/09 0508) SpO2:  [96 %-100 %] 97 % (06/09 0508) Last BM Date: 03/20/16 General:   Alert,  Well-developed, morbidly obese, NAD Head:  Normocephalic and atraumatic. Eyes:  Sclera clear, no icterus.   Conjunctiva pink. Ears:  Normal auditory acuity. Nose:  No deformity, discharge,  or lesions. Mouth:  No deformity or lesions.  Oropharynx pink &  moist. Neck:  Thick but supple; no masses or thyromegaly. Lungs:  Clear throughout to auscultation.   No wheezes, crackles, or rhonchi. No acute distress. Heart:  Regular rate and rhythm; no murmurs, clicks, rubs,  or gallops. Abdomen:  Soft,morbidly obese, right upper quadrant tenderness with some voluntary guarding, No masses, hepatosplenomegaly or hernias noted. No peritonitis Msk:  Symmetrical without gross deformities. Normal posture. Pulses:  Normal pulses noted. Extremities:  Without clubbing or edema. Neurologic:  Alert and  oriented x4; diffusely weak, otherwise grossly normal neurologically. Skin:  Intact without significant lesions or rashes. Psych:  Alert and cooperative. Normal mood and affect.   Lab Results:  Recent Labs  03/21/16 0754 03/21/16 1150 03/22/16 0515  WBC 8.9 7.6 7.4  HGB 14.5 14.1 13.4  HCT 43.6 41.3 41.2  PLT 221 180 200   BMET  Recent Labs  03/21/16 0830 03/21/16 1150 03/22/16 0515  NA 137  --  139  K 3.6  --  3.8  CL 104  --  107  CO2 26  --  26  GLUCOSE 113*  --  120*  BUN 16  --  14  CREATININE 0.88 0.92 1.05  CALCIUM 9.0  --  8.6*   LFT  Recent Labs  03/21/16 0841 03/22/16 0515  PROT 7.6 7.1  ALBUMIN 4.2 3.8  AST 91* 44*  ALT 42 38  ALKPHOS 130* 124  BILITOT 1.6* 2.2*  BILIDIR 1.0*  --   IBILI 0.6  --    PT/INR No results for input(s): LABPROT, INR in the last 72 hours.  Lipase     Component Value Date/Time   LIPASE 1217* 03/22/2016 0515     Studies/Results: US Abdomen Limited  03/21/2016  CLINICAL DATA:  Acute back pain, abnormal gallbladder on CT scan EXAM: US ABDOMEN LIMITED - RIGHT UPPER QUADRANT COMPARISON:  CT scan 03/21/2016 FINDINGS: Gallbladder: Gallstones are noted within gallbladder the largest measures 1.8 cm. There is thickening of gallbladder wall up to 6.6 mm. Findings are suspicious for acute cholecystitis and clinical correlation is necessary. Common bile duct: Diameter: 10.4 mm in diameter prominent  in size. Liver: No focal hepatic mass. Mild intrahepatic biliary ductal dilatation. Mild increased echogenicity of the liver suspicious for fatty infiltration. IMPRESSION: Gallstones are noted within gallbladder the largest measures 1.8 cm. There is thickening of gallbladder wall up to 6.6 mm. Findings are suspicious for acute cholecystitis and clinical correlation is necessary. Prominent size CBD measures 3.4 mm in diameter. Mild intrahepatic biliary ductal dilatation. Mild increased echogenicity of the liver suspicious for fatty infiltration. Electronically Signed   By: Natasha Mead M.D.   On: 03/21/2016 09:52   Ct Renal Stone Study  03/21/2016  CLINICAL DATA:  Right flank pain which began last evening. No urinary symptoms. Nausea and vomiting this morning. EXAM: CT ABDOMEN AND PELVIS WITHOUT CONTRAST TECHNIQUE: Multidetector CT imaging of the abdomen and pelvis was performed following the standard protocol without IV contrast. COMPARISON:  12/27/2013 FINDINGS: Lower chest: The lung bases are clear of acute process. Minimal atelectasis. The heart is upper limits of normal in size. No pericardial effusion. The distal esophagus is grossly normal. Surgical changes involving the GE junction and stomach are noted. Probable partial gastrectomy. Hepatobiliary: Moderate periportal edema. The gallbladder is distended. No obvious gallstones. Possible inflammatory change around the gallbladder neck with inflammation gallbladder fossa. Could not exclude acute cholecystitis. No intra or extrahepatic biliary dilatation. Pancreas: No mass, inflammation or ductal dilatation. Spleen: Normal size.  No focal lesions. Adrenals/Urinary Tract: Nodularity of bilateral adrenal glands consistent with benign adenomas. There is a midpole right renal calculus but no obstructing ureteral calculi or bladder calculi. The left kidney is unremarkable. No hydronephrosis or renal mass. Stomach/Bowel: Stable surgical changes involving the stomach.  The duodenum, small bowel and colon are grossly normal without oral contrast. No inflammatory changes of mass lesions or obstructive findings. The terminal ileum is normal. The appendix is normal. Vascular/Lymphatic: The aorta is normal in caliber. Stable atherosclerotic calcifications. Stable hazy interstitial change in the small bowel mesenteric and small scattered mesenteric lymph nodes no mass or overt adenopathy. Other: No pelvic mass or adenopathy. No free pelvic fluid collections. The bladder, prostate gland and seminal vesicles are unremarkable. No inguinal mass or  adenopathy. Musculoskeletal: Stable advanced degenerative changes along the spine, particularly the facet joints. Evidence of previous lumbar surgery with decompressive laminectomies. IMPRESSION: 1. Findings suspicious for acute cholecystitis. Recommend right upper quadrant ultrasound examination for further evaluation. No intra or extrahepatic biliary dilatation. Moderate periportal edema. 2. Midpole right renal calculus but no obstructing ureteral calculi or bladder calculi. 3. No obvious acute bowel process. 4. Stable surgical changes involving stomach and spine. Electronically Signed   By: Rudie Meyer M.D.   On: 03/21/2016 08:31   Impression:  1.  Gallstone pancreatitis. 2.  Possible cholecystitis. 3.  Elevated LFTs. 4.  Prominent bile duct. 5.  Obesity, history of gastric sleeve surgery. 6.  Multiple other medical problems.  Plan:  1.  Diet per surgical team; probably no more than sips of clears. 2.  Follow LFTs.  If LFTs go down on their own, might consider cholecystectomy with IOC; if LFTs do not go down over the next couple days, might have to consider ERCP. 3.  Medical management of pancreatitis. 4.  Patient will need a couple days for his pancreatitis to "cool down" prior to consideration of ERCP (which, as above, I would only consider if patient's LFTs do not go down on their own over the next few days).  Given  patient's gastric sleeve (which would likely not preclude ERCP but may make it more difficult) and patient's morbid obesity and history of multiple other medical problems, would probably be best to wait at least until Monday before performing ERCP (unless patient develops fulminant cholangitis, which he at present does not have), so the full complement of medical and ancillary staff are available if needs arise. 5.  Risks (up to and including bleeding, infection, perforation, pancreatitis that can be complicated by infected necrosis and death), benefits (removal of stones, alleviating blockage, decreasing risk of cholangitis or choledocholithiasis-related pancreatitis), and alternatives (watchful waiting, percutaneous transhepatic cholangiography) of ERCP were explained to patient/family in detail and patient elects to proceed.  Patient aware that we are holding off on ERCP for now, pending improvement of his pancreatitis. 6.  [facility] will follow.     Freddy Jaksch  03/22/2016, 9:05 AM  Pager [phone] If no answer or after 5 PM call [phone]

## 2016-03-23 LAB — COMPREHENSIVE METABOLIC PANEL
ALBUMIN: 3.3 g/dL — AB (ref 3.5–5.0)
ALT: 23 U/L (ref 17–63)
AST: 18 U/L (ref 15–41)
Alkaline Phosphatase: 91 U/L (ref 38–126)
Anion gap: 6 (ref 5–15)
BILIRUBIN TOTAL: 1.4 mg/dL — AB (ref 0.3–1.2)
BUN: 12 mg/dL (ref 6–20)
CHLORIDE: 108 mmol/L (ref 101–111)
CO2: 25 mmol/L (ref 22–32)
Calcium: 8.2 mg/dL — ABNORMAL LOW (ref 8.9–10.3)
Creatinine, Ser: 0.94 mg/dL (ref 0.61–1.24)
GFR calc Af Amer: >60 mL/min (ref >60)
GFR calc non Af Amer: >60 mL/min (ref >60)
GLUCOSE: 98 mg/dL (ref 65–99)
POTASSIUM: 3.7 mmol/L (ref 3.5–5.1)
SODIUM: 139 mmol/L (ref 135–145)
Total Protein: 6.4 g/dL — ABNORMAL LOW (ref 6.5–8.1)

## 2016-03-23 LAB — CBC
HEMATOCRIT: 38.4 % — AB (ref 39.0–52.0)
HEMOGLOBIN: 12.6 g/dL — AB (ref 13.0–17.0)
MCH: 27 pg (ref 26.0–34.0)
MCHC: 32.8 g/dL (ref 30.0–36.0)
MCV: 82.4 fL (ref 78.0–100.0)
Platelets: 181 10*3/uL (ref 150–400)
RBC: 4.66 MIL/uL (ref 4.22–5.81)
RDW: 15.6 % — ABNORMAL HIGH (ref 11.5–15.5)
WBC: 9.9 10*3/uL (ref 4.0–10.5)

## 2016-03-23 LAB — LIPASE, BLOOD: Lipase: 44 U/L (ref 11–51)

## 2016-03-23 NOTE — Progress Notes (Signed)
Plan lap chole tomorrow.  His wife was at the bedside.  I discussed with the patient the indications and risks of gall bladder surgery.  The primary risks of gall bladder surgery include, but are not limited to, bleeding, infection, common bile duct injury, and open surgery.  We discussed the typical post-operative recovery course. I tried to answer the patient's questions.  He is at some increased risk for open surgery because of prior surgery.  He understands this.  Plan surgery tomorrow AM around 9.  Dr. Toth will probably be primary surgeon.  [name] Kinavid Gifford Ballon, MD, LancCarolyn[facility] Pager: [phone] Office phone:  [phone]

## 2016-03-23 NOTE — Progress Notes (Signed)
Subjective: Abdominal pain much improved.  Objective: Vital signs in last 24 hours: Temp:  [99.5 F (37.5 C)-99.9 F (37.7 C)] 99.9 F (37.7 C) (06/10 0507) Pulse Rate:  [73-86] 86 (06/10 0507) Resp:  [18-20] 18 (06/10 0507) BP: (135-188)/(77-106) 135/77 mmHg (06/10 0507) SpO2:  [96 %-99 %] 96 % (06/10 0507) Weight change:  Last BM Date: 03/20/16 (per pt)  PE: GEN:  Obese, NAD, pleasant and friendly  Lab Results: CBC    Component Value Date/Time   WBC 9.9 03/23/2016 0528   RBC 4.66 03/23/2016 0528   RBC 4.24 08/16/2008 0535   HGB 12.6* 03/23/2016 0528   HCT 38.4* 03/23/2016 0528   PLT 181 03/23/2016 0528   MCV 82.4 03/23/2016 0528   MCH 27.0 03/23/2016 0528   MCHC 32.8 03/23/2016 0528   RDW 15.6* 03/23/2016 0528   LYMPHSABS 1.3 03/21/2016 0754   MONOABS 0.8 03/21/2016 0754   EOSABS 0.0 03/21/2016 0754   BASOSABS 0.0 03/21/2016 0754   CMP     Component Value Date/Time   NA 139 03/23/2016 0528   K 3.7 03/23/2016 0528   CL 108 03/23/2016 0528   CO2 25 03/23/2016 0528   GLUCOSE 98 03/23/2016 0528   BUN 12 03/23/2016 0528   CREATININE 0.94 03/23/2016 0528   CREATININE 0.86 03/26/2013 0907   CALCIUM 8.2* 03/23/2016 0528   PROT 6.4* 03/23/2016 0528   ALBUMIN 3.3* 03/23/2016 0528   AST 18 03/23/2016 0528   ALT 23 03/23/2016 0528   ALKPHOS 91 03/23/2016 0528   BILITOT 1.4* 03/23/2016 0528   GFRNONAA >60 03/23/2016 0528   GFRAA >60 03/23/2016 0528   Assessment:  1.  Gallstone pancreatitis.  Clinically much improved. 2.  Elevated LFTs, much improved. 3.  Obesity with history of gastric sleeve procedure.  Plan:  1.  In light of improvement of pain and decrease in LFTs, cholecystectomy with IOC would seem next most appropriate step in management. 2.  [facility] will follow at a distance, pending intraoperative cholangiogram findings.   Freddy JakschOUTLAW,Armine Rizzolo M 03/23/2016, 10:11 AM   Pager [phone] If no answer or after 5 PM call [phone]

## 2016-03-23 NOTE — Progress Notes (Signed)
Subjective: No complaints. Feels better. Minimal abdominal pain  Objective: Vital signs in last 24 hours: Temp:  [99.5 F (37.5 C)-99.9 F (37.7 C)] 99.9 F (37.7 C) (06/10 0507) Pulse Rate:  [73-86] 86 (06/10 0507) Resp:  [18-20] 18 (06/10 0507) BP: (135-188)/(77-106) 135/77 mmHg (06/10 0507) SpO2:  [96 %-99 %] 96 % (06/10 0507) Last BM Date: 03/22/16  Intake/Output from previous day: 06/09 0701 - 06/10 0700 In: 1100 [I.V.:900; IV Piggyback:200] Out: 450 [Urine:450] Intake/Output this shift:    Resp: clear to auscultation bilaterally Cardio: regular rate and rhythm GI: soft, nontender.  Lab Results:   Recent Labs  03/22/16 0515 03/23/16 0528  WBC 7.4 9.9  HGB 13.4 12.6*  HCT 41.2 38.4*  PLT 200 181   BMET  Recent Labs  03/22/16 0515 03/23/16 0528  NA 139 139  K 3.8 3.7  CL 107 108  CO2 26 25  GLUCOSE 120* 98  BUN 14 12  CREATININE 1.05 0.94  CALCIUM 8.6* 8.2*   PT/INR No results for input(s): LABPROT, INR in the last 72 hours. ABG No results for input(s): PHART, HCO3 in the last 72 hours.  Invalid input(s): PCO2, PO2  Studies/Results: [place] Abdomen Limited  03/21/2016  CLINICAL DATA:  Acute back pain, abnormal gallbladder on CT scan EXAM: US ABDOMEN LIMITED - RIGHT UPPER QUADRANT COMPARISON:  CT scan 03/21/2016 FINDINGS: Gallbladder: Gallstones are noted within gallbladder the largest measures 1.8 cm. There is thickening of gallbladder wall up to 6.6 mm. Findings are suspicious for acute cholecystitis and clinical correlation is necessary. Common bile duct: Diameter: 10.4 mm in diameter prominent in size. Liver: No focal hepatic mass. Mild intrahepatic biliary ductal dilatation. Mild increased echogenicity of the liver suspicious for fatty infiltration. IMPRESSION: Gallstones are noted within gallbladder the largest measures 1.8 cm. There is thickening of gallbladder wall up to 6.6 mm. Findings are suspicious for acute cholecystitis and clinical  correlation is necessary. Prominent size CBD measures 3.4 mm in diameter. Mild intrahepatic biliary ductal dilatation. Mild increased echogenicity of the liver suspicious for fatty infiltration. Electronically Signed   By: Natasha MeadLiviu  Pop M.D.   On: 03/21/2016 09:52   Ct Renal Stone Study  03/21/2016  CLINICAL DATA:  Right flank pain which began last evening. No urinary symptoms. Nausea and vomiting this morning. EXAM: CT ABDOMEN AND PELVIS WITHOUT CONTRAST TECHNIQUE: Multidetector CT imaging of the abdomen and pelvis was performed following the standard protocol without IV contrast. COMPARISON:  12/27/2013 FINDINGS: Lower chest: The lung bases are clear of acute process. Minimal atelectasis. The heart is upper limits of normal in size. No pericardial effusion. The distal esophagus is grossly normal. Surgical changes involving the GE junction and stomach are noted. Probable partial gastrectomy. Hepatobiliary: Moderate periportal edema. The gallbladder is distended. No obvious gallstones. Possible inflammatory change around the gallbladder neck with inflammation gallbladder fossa. Could not exclude acute cholecystitis. No intra or extrahepatic biliary dilatation. Pancreas: No mass, inflammation or ductal dilatation. Spleen: Normal size.  No focal lesions. Adrenals/Urinary Tract: Nodularity of bilateral adrenal glands consistent with benign adenomas. There is a midpole right renal calculus but no obstructing ureteral calculi or bladder calculi. The left kidney is unremarkable. No hydronephrosis or renal mass. Stomach/Bowel: Stable surgical changes involving the stomach. The duodenum, small bowel and colon are grossly normal without oral contrast. No inflammatory changes of mass lesions or obstructive findings. The terminal ileum is normal. The appendix is normal. Vascular/Lymphatic: The aorta is normal in caliber. Stable atherosclerotic calcifications. Stable hazy interstitial  change in the small bowel mesenteric and  small scattered mesenteric lymph nodes no mass or overt adenopathy. Other: No pelvic mass or adenopathy. No free pelvic fluid collections. The bladder, prostate gland and seminal vesicles are unremarkable. No inguinal mass or adenopathy. Musculoskeletal: Stable advanced degenerative changes along the spine, particularly the facet joints. Evidence of previous lumbar surgery with decompressive laminectomies. IMPRESSION: 1. Findings suspicious for acute cholecystitis. Recommend right upper quadrant ultrasound examination for further evaluation. No intra or extrahepatic biliary dilatation. Moderate periportal edema. 2. Midpole right renal calculus but no obstructing ureteral calculi or bladder calculi. 3. No obvious acute bowel process. 4. Stable surgical changes involving stomach and spine. Electronically Signed   By: Rudie Meyer M.D.   On: 03/21/2016 08:31    Anti-infectives: Anti-infectives    Start     Dose/Rate Route Frequency Ordered Stop   03/21/16 1130  ciprofloxacin (CIPRO) IVPB 400 mg     400 mg 200 mL/hr over 60 Minutes Intravenous Every 12 hours 03/21/16 1121        Assessment/Plan: s/p * No surgery found * continue bowel rest  Lipase and lft's have normalized. Will consider lap chole soon Continue cipro Risks and benefits of the surgery to remove the gallbladder discussed with the patient and he understands and wishes to proceed   LOS: 1 day    TOTH III,Domingos Riggi S 03/23/2016

## 2016-03-24 ENCOUNTER — Inpatient Hospital Stay (HOSPITAL_COMMUNITY): Payer: No Typology Code available for payment source | Admitting: Anesthesiology

## 2016-03-24 ENCOUNTER — Inpatient Hospital Stay (HOSPITAL_COMMUNITY): Payer: No Typology Code available for payment source

## 2016-03-24 ENCOUNTER — Encounter (HOSPITAL_COMMUNITY): Admission: EM | Disposition: A | Discharge status: Home / Self Care | Payer: Self-pay | Source: Home / Self Care

## 2016-03-24 HISTORY — PX: CHOLECYSTECTOMY: SHX55

## 2016-03-24 LAB — SURGICAL PCR SCREEN
MRSA, PCR: INVALID — AB
STAPHYLOCOCCUS AUREUS: INVALID — AB

## 2016-03-24 SURGERY — LAPAROSCOPIC CHOLECYSTECTOMY WITH INTRAOPERATIVE CHOLANGIOGRAM
Anesthesia: General | Site: Abdomen

## 2016-03-24 MED ORDER — PHENYLEPHRINE 40 MCG/ML (10ML) SYRINGE FOR IV PUSH (FOR BLOOD PRESSURE SUPPORT)
PREFILLED_SYRINGE | INTRAVENOUS | Status: AC
  Filled 2016-03-24: qty 10

## 2016-03-24 MED ORDER — PROPOFOL 10 MG/ML IV BOLUS
INTRAVENOUS | Status: AC
  Filled 2016-03-24: qty 20

## 2016-03-24 MED ORDER — SUCCINYLCHOLINE CHLORIDE 20 MG/ML IJ SOLN
INTRAMUSCULAR | Status: DC | PRN
  Administered 2016-03-24: 160 mg via INTRAVENOUS

## 2016-03-24 MED ORDER — ROCURONIUM BROMIDE 100 MG/10ML IV SOLN
INTRAVENOUS | Status: AC
  Filled 2016-03-24: qty 1

## 2016-03-24 MED ORDER — MORPHINE SULFATE (PF) 2 MG/ML IV SOLN: 1.0000 mg | INTRAVENOUS | Status: DC | PRN

## 2016-03-24 MED ORDER — OXYCODONE HCL 5 MG PO TABS: 5.0000 mg | ORAL_TABLET | Freq: Once | ORAL | Status: DC | PRN

## 2016-03-24 MED ORDER — PHENYLEPHRINE HCL 10 MG/ML IJ SOLN
INTRAMUSCULAR | Status: DC | PRN
  Administered 2016-03-24: 80 ug via INTRAVENOUS
  Administered 2016-03-24: 40 ug via INTRAVENOUS

## 2016-03-24 MED ORDER — ACETAMINOPHEN 160 MG/5ML PO SOLN: 325.0000 mg | ORAL | Status: DC | PRN

## 2016-03-24 MED ORDER — ONDANSETRON HCL 4 MG/2ML IJ SOLN
INTRAMUSCULAR | Status: DC | PRN
  Administered 2016-03-24: 4 mg via INTRAVENOUS

## 2016-03-24 MED ORDER — ROCURONIUM BROMIDE 100 MG/10ML IV SOLN
INTRAVENOUS | Status: DC | PRN
Start: 2016-03-24 — End: 2016-03-24
  Administered 2016-03-24: 50 mg via INTRAVENOUS

## 2016-03-24 MED ORDER — PHENYLEPHRINE HCL 10 MG/ML IJ SOLN
10.0000 mg | INTRAVENOUS | Status: DC | PRN
  Administered 2016-03-24: 50 ug/min via INTRAVENOUS

## 2016-03-24 MED ORDER — FENTANYL CITRATE (PF) 100 MCG/2ML IJ SOLN
25.0000 ug | INTRAMUSCULAR | Status: DC | PRN
  Administered 2016-03-24 (×2): 50 ug via INTRAVENOUS

## 2016-03-24 MED ORDER — IOPAMIDOL (ISOVUE-300) INJECTION 61%
INTRAVENOUS | Status: DC | PRN
  Administered 2016-03-24: 20 mL

## 2016-03-24 MED ORDER — MIDAZOLAM HCL 2 MG/2ML IJ SOLN
INTRAMUSCULAR | Status: AC
  Filled 2016-03-24: qty 2

## 2016-03-24 MED ORDER — SUGAMMADEX SODIUM 200 MG/2ML IV SOLN
INTRAVENOUS | Status: DC | PRN
  Administered 2016-03-24: 300 mg via INTRAVENOUS

## 2016-03-24 MED ORDER — ACETAMINOPHEN 325 MG PO TABS: 325.0000 mg | ORAL_TABLET | ORAL | Status: DC | PRN

## 2016-03-24 MED ORDER — LIDOCAINE HCL (CARDIAC) 20 MG/ML IV SOLN
INTRAVENOUS | Status: DC | PRN
  Administered 2016-03-24: 100 mg via INTRATRACHEAL

## 2016-03-24 MED ORDER — BUPIVACAINE-EPINEPHRINE (PF) 0.25% -1:200000 IJ SOLN
INTRAMUSCULAR | Status: AC
  Filled 2016-03-24: qty 30

## 2016-03-24 MED ORDER — PROPOFOL 10 MG/ML IV BOLUS
INTRAVENOUS | Status: DC | PRN
  Administered 2016-03-24: 200 mg via INTRAVENOUS

## 2016-03-24 MED ORDER — FENTANYL CITRATE (PF) 100 MCG/2ML IJ SOLN
INTRAMUSCULAR | Status: AC
  Filled 2016-03-24: qty 2

## 2016-03-24 MED ORDER — LIDOCAINE HCL (CARDIAC) 20 MG/ML IV SOLN
INTRAVENOUS | Status: AC
  Filled 2016-03-24: qty 5

## 2016-03-24 MED ORDER — SUGAMMADEX SODIUM 500 MG/5ML IV SOLN
INTRAVENOUS | Status: AC
  Filled 2016-03-24: qty 5

## 2016-03-24 MED ORDER — LACTATED RINGERS IV SOLN
INTRAVENOUS | Status: DC | PRN
  Administered 2016-03-24: 1000 mL via INTRAVENOUS

## 2016-03-24 MED ORDER — 0.9 % SODIUM CHLORIDE (POUR BTL) OPTIME
TOPICAL | Status: DC | PRN
  Administered 2016-03-24: 1000 mL

## 2016-03-24 MED ORDER — OXYCODONE HCL 5 MG/5ML PO SOLN: 5.0000 mg | Freq: Once | ORAL | Status: DC | PRN

## 2016-03-24 MED ORDER — ONDANSETRON HCL 4 MG/2ML IJ SOLN
INTRAMUSCULAR | Status: AC
  Filled 2016-03-24: qty 2

## 2016-03-24 MED ORDER — HYDROCODONE-ACETAMINOPHEN 5-325 MG PO TABS
1.0000 | ORAL_TABLET | ORAL | Status: DC | PRN
  Administered 2016-03-25: 1 via ORAL
  Filled 2016-03-24: qty 1

## 2016-03-24 MED ORDER — FENTANYL CITRATE (PF) 250 MCG/5ML IJ SOLN
INTRAMUSCULAR | Status: AC
  Filled 2016-03-24: qty 5

## 2016-03-24 MED ORDER — DEXAMETHASONE SODIUM PHOSPHATE 10 MG/ML IJ SOLN
INTRAMUSCULAR | Status: DC | PRN
  Administered 2016-03-24: 10 mg via INTRAVENOUS

## 2016-03-24 MED ORDER — DEXAMETHASONE SODIUM PHOSPHATE 10 MG/ML IJ SOLN
INTRAMUSCULAR | Status: AC
  Filled 2016-03-24: qty 1

## 2016-03-24 MED ORDER — BUPIVACAINE-EPINEPHRINE 0.25% -1:200000 IJ SOLN
INTRAMUSCULAR | Status: DC | PRN
  Administered 2016-03-24: 20 mL

## 2016-03-24 MED ORDER — MIDAZOLAM HCL 5 MG/5ML IJ SOLN
INTRAMUSCULAR | Status: DC | PRN
  Administered 2016-03-24: 2 mg via INTRAVENOUS

## 2016-03-24 MED ORDER — CIPROFLOXACIN IN D5W 400 MG/200ML IV SOLN
INTRAVENOUS | Status: AC
  Filled 2016-03-24: qty 200

## 2016-03-24 MED ORDER — IOPAMIDOL (ISOVUE-300) INJECTION 61%
INTRAVENOUS | Status: AC
  Filled 2016-03-24: qty 50

## 2016-03-24 MED ORDER — FENTANYL CITRATE (PF) 250 MCG/5ML IJ SOLN
INTRAMUSCULAR | Status: DC | PRN
  Administered 2016-03-24: 50 ug via INTRAVENOUS
  Administered 2016-03-24: 100 ug via INTRAVENOUS
  Administered 2016-03-24 (×2): 50 ug via INTRAVENOUS

## 2016-03-24 MED ORDER — LACTATED RINGERS IV SOLN
INTRAVENOUS | Status: DC | PRN
  Administered 2016-03-24 (×2): via INTRAVENOUS

## 2016-03-24 SURGICAL SUPPLY — 32 items
APPLIER CLIP 5 13 M/L LIGAMAX5 (MISCELLANEOUS) ×3
CABLE HIGH FREQUENCY MONO STRZ (ELECTRODE) ×3 IMPLANT
CATH REDDICK CHOLANGI 4FR 50CM (CATHETERS) ×3 IMPLANT
CHLORAPREP W/TINT 26ML (MISCELLANEOUS) ×3 IMPLANT
CLIP APPLIE 5 13 M/L LIGAMAX5 (MISCELLANEOUS) ×1 IMPLANT
COVER MAYO STAND STRL (DRAPES) ×3 IMPLANT
COVER SURGICAL LIGHT HANDLE (MISCELLANEOUS) ×3 IMPLANT
DECANTER SPIKE VIAL GLASS SM (MISCELLANEOUS) ×3 IMPLANT
DRAPE C-ARM 42X120 X-RAY (DRAPES) ×3 IMPLANT
DRAPE LAPAROSCOPIC ABDOMINAL (DRAPES) ×3 IMPLANT
ELECT REM PT RETURN 9FT ADLT (ELECTROSURGICAL) ×3
ELECTRODE REM PT RTRN 9FT ADLT (ELECTROSURGICAL) ×1 IMPLANT
ENDOLOOP SUT PDS II  0 18 (SUTURE) ×2
ENDOLOOP SUT PDS II 0 18 (SUTURE) ×1 IMPLANT
GLOVE BIO SURGEON STRL SZ7.5 (GLOVE) ×3 IMPLANT
GOWN STRL REUS W/TWL XL LVL3 (GOWN DISPOSABLE) ×9 IMPLANT
HEMOSTAT SURGICEL 4X8 (HEMOSTASIS) IMPLANT
IV CATH 14GX2 1/4 (CATHETERS) ×3 IMPLANT
KIT BASIN OR (CUSTOM PROCEDURE TRAY) ×3 IMPLANT
LIQUID BAND (GAUZE/BANDAGES/DRESSINGS) ×3 IMPLANT
POUCH SPECIMEN RETRIEVAL 10MM (ENDOMECHANICALS) ×3 IMPLANT
SCISSORS LAP 5X35 DISP (ENDOMECHANICALS) ×3 IMPLANT
SET IRRIG TUBING LAPAROSCOPIC (IRRIGATION / IRRIGATOR) ×3 IMPLANT
SLEEVE XCEL OPT CAN 5 100 (ENDOMECHANICALS) ×6 IMPLANT
SUT MNCRL AB 4-0 PS2 18 (SUTURE) ×3 IMPLANT
TAPE CLOTH 4X10 WHT NS (GAUZE/BANDAGES/DRESSINGS) IMPLANT
TOWEL OR 17X26 10 PK STRL BLUE (TOWEL DISPOSABLE) ×3 IMPLANT
TOWEL OR NON WOVEN STRL DISP B (DISPOSABLE) ×3 IMPLANT
TRAY LAPAROSCOPIC (CUSTOM PROCEDURE TRAY) ×3 IMPLANT
TROCAR BLADELESS OPT 5 100 (ENDOMECHANICALS) ×3 IMPLANT
TROCAR XCEL BLUNT TIP 100MML (ENDOMECHANICALS) ×3 IMPLANT
TUBING INSUF HEATED (TUBING) ×3 IMPLANT

## 2016-03-24 NOTE — Progress Notes (Signed)
Patient ID: [name] [name], male   DOB: 11-15-1955, 60 y.o.   MRN: [id number] Risks and benefits of the surgery discussed with the patient and he understands and wishes to proceed

## 2016-03-24 NOTE — Anesthesia Procedure Notes (Signed)
Procedure Name: Intubation Date/Time: 03/24/2016 9:48 AM Performed by: [place]UST[place], Freida Nebel C Patient Re-evaluated:Patient Re-evaluated prior to inductionOxygen Delivery Method: Circle system utilized Preoxygenation: Pre-oxygenation with 100% oxygen Intubation Type: IV induction and Inhalational induction Ventilation: Mask ventilation without difficulty Laryngoscope Size: Mac and 4 Grade View: Grade II Tube type: Oral Number of attempts: 1 Airway Equipment and Method: Stylet Placement Confirmation: ETT inserted through vocal cords under direct vision,  positive ETCO2,  breath sounds checked- equal and bilateral and CO2 detector Secured at: 21 cm Tube secured with: Tape Dental Injury: Teeth and Oropharynx as per pre-operative assessment

## 2016-03-24 NOTE — Transfer of Care (Signed)
Immediate Anesthesia Transfer of Care Note  Patient: [name] [name]  Procedure(s) Performed: Procedure(s): LAPAROSCOPIC CHOLECYSTECTOMY WITH INTRAOPERATIVE CHOLANGIOGRAM (N/A)  Patient Location: PACU  Anesthesia Type:General  Level of Consciousness: awake, alert  and oriented  Airway & Oxygen Therapy: Patient Spontanous Breathing and Patient connected to face mask oxygen  Post-op Assessment: Report given to RN and Post -op Vital signs reviewed and stable  Post vital signs: Reviewed and stable  Last Vitals:  Filed Vitals:   03/23/16 2259 03/24/16 0644  BP:  142/78  Pulse:  86  Temp: 37.2 C 37.2 C  Resp:  18    Last Pain:  Filed Vitals:   03/24/16 0646  PainSc: 1       Patients Stated Pain Goal: 1 (03/23/16 1953)  Complications: No apparent anesthesia complications 

## 2016-03-24 NOTE — Op Note (Signed)
03/21/2016 - 03/24/2016  11:43 AM  PATIENT:  [name] [name]  60 y.o. male  PRE-OPERATIVE DIAGNOSIS:  Gallstone pancreatitis  POST-OPERATIVE DIAGNOSIS:  Gallstone pancreatitis  PROCEDURE:  Procedure(s): LAPAROSCOPIC CHOLECYSTECTOMY WITH INTRAOPERATIVE CHOLANGIOGRAM (N/A)  SURGEON:  Surgeon(s) and Role:    * Griselda Miner, MD - Primary    * Ovidio Kin, MD - Assisting  PHYSICIAN ASSISTANT:   ASSISTANTS: Dr. Ezzard Standing   ANESTHESIA:   general  EBL:  Total I/O In: 1000 [I.V.:1000] Out: -   BLOOD ADMINISTERED:none  DRAINS: none   LOCAL MEDICATIONS USED:  MARCAINE     SPECIMEN:  Source of Specimen:  gallbladder  DISPOSITION OF SPECIMEN:  PATHOLOGY  COUNTS:  YES  TOURNIQUET:  * No tourniquets in log *  DICTATION: .Dragon Dictation   Procedure: After informed consent was obtained the patient was brought to the operating room and placed in the supine position on the operating room table. After adequate induction of general anesthesia the patient's abdomen was prepped with ChloraPrep allowed to dry and draped in usual sterile manner. The area below the umbilicus was infiltrated with quarter percent  Marcaine. A small incision was made with a 15 blade knife. The incision was carried down through the subcutaneous tissue bluntly with a hemostat and [employer] retractors. The linea alba was identified. The linea alba was incised with a 15 blade knife and each side was grasped with Coker clamps. The preperitoneal space was then probed with a hemostat until the peritoneum was opened and access was gained to the abdominal cavity. A 0 Vicryl pursestring stitch was placed in the fascia surrounding the opening. A Hassan cannula was then placed through the opening and anchored in place with the previously placed Vicryl purse string stitch. The abdomen was insufflated with carbon dioxide without difficulty. A laparoscope was inserted through the [facility] cannula in the right upper quadrant was inspected.  Next the epigastric region was infiltrated with % Marcaine. A small incision was made with a 15 blade knife. A 5 mm port was placed bluntly through this incision into the abdominal cavity under direct vision. Next 2 sites were chosen laterally on the right side of the abdomen for placement of 5 mm ports. Each of these areas was infiltrated with quarter percent Marcaine. Small stab incisions were made with a 15 blade knife. 5 mm ports were then placed bluntly through these incisions into the abdominal cavity under direct vision without difficulty. A blunt grasper was placed through the lateralmost 5 mm port and used to grasp the dome of the gallbladder and elevated anteriorly and superiorly. The gallbladder was quite thick walled and edematous.  Another blunt grasper was placed through the other 5 mm port and used to retract the body and neck of the gallbladder. A dissector was placed through the epigastric port and using the electrocautery the peritoneal reflection at the gallbladder neck was opened. Blunt dissection was then carried out in this area until the gallbladder neck-cystic duct junction was readily identified and a good window was created. A single clip was placed on the gallbladder neck. A small  ductotomy was made just below the clip with laparoscopic scissors. A 14-gauge Angiocath was then placed through the anterior abdominal wall under direct vision. A Reddick cholangiogram catheter was then placed through the Angiocath and flushed. The catheter was then placed in the cystic duct and anchored in place with a clip. A cholangiogram was obtained that showed no filling defects good emptying into the duodenum an adequate  length on the cystic duct. The anchoring clip and catheters were then removed from the patient. 2 clips were placed proximally on the cystic duct and the duct was divided between the 2 sets of clips. Several stones were spilled so the epigastric port was changed to a 10mm port and a  stone scooper was used to retrieve all the stones.  Posterior to this the cystic artery was identified and again dissected bluntly in a circumferential manner until a good window  was created. 2 clips were placed proximally and one distally on the artery and the artery was divided between the 2 sets of clips. Next a laparoscopic hook cautery device was used to separate the gallbladder from the liver bed. Prior to completely detaching the gallbladder from the liver bed the liver bed was inspected and several small bleeding points were coagulated with the electrocautery until the area was completely hemostatic. The gallbladder was then detached the rest of it from the liver bed without difficulty. A laparoscopic bag was inserted through the hassan port. The laparoscope was moved to the epigastric port. The gallbladder was placed within the bag and the bag was sealed.  The bag with the gallbladder was then [facility] through the infraumbilical port without difficulty. The fascial defect was then closed with the previously placed Vicryl pursestring stitch as well as with another figure-of-eight 0 Vicryl stitch. The liver bed was inspected again and found to be hemostatic. The abdomen was irrigated with copious amounts of saline until the effluent was clear. The ports were then removed under direct vision without difficulty and were found to be hemostatic. The gas was allowed to escape. The skin incisions were all closed with interrupted 4-0 Monocryl subcuticular stitches. Dermabond dressings were applied. The patient tolerated the procedure well. At the end of the case all needle sponge and instrument counts were correct. The patient was then awakened and taken to recovery in stable condition  PLAN OF CARE: Admit to inpatient   PATIENT DISPOSITION:  PACU - hemodynamically stable.   Delay start of Pharmacological VTE agent (>24hrs) due to surgical blood loss or risk of bleeding: no

## 2016-03-24 NOTE — Anesthesia Postprocedure Evaluation (Signed)
Anesthesia Post Note  Patient: [name] [name]  Procedure(s) Performed: Procedure(s) (LRB): LAPAROSCOPIC CHOLECYSTECTOMY WITH INTRAOPERATIVE CHOLANGIOGRAM (N/A)  Patient location during evaluation: PACU Anesthesia Type: General Level of consciousness: awake Pain management: pain level controlled Vital Signs Assessment: post-procedure vital signs reviewed and stable Respiratory status: spontaneous breathing Cardiovascular status: stable Postop Assessment: no signs of nausea or vomiting Anesthetic complications: no    Last Vitals:  Filed Vitals:   03/24/16 1829 03/24/16 2152  BP: 138/66 165/88  Pulse: 89 76  Temp:  37.2 C  Resp:  20    Last Pain:  Filed Vitals:   03/24/16 2203  PainSc: 5                  Devonda Pequignot

## 2016-03-24 NOTE — Anesthesia Preprocedure Evaluation (Signed)
Anesthesia Evaluation  Patient identified by MRN, date of birth, ID band Patient awake    Reviewed: Allergy & Precautions, NPO status , Patient's Chart, lab work & pertinent test results  History of Anesthesia Complications Negative for: history of anesthetic complications  Airway Mallampati: III  TM Distance: >3 FB Neck ROM: Full    Dental  (+) Teeth Intact   Pulmonary neg shortness of breath, neg sleep apnea, neg COPD, neg recent URI, former smoker,    breath sounds clear to auscultation       Cardiovascular hypertension, (-) angina(-) Past MI and (-) CHF (-) dysrhythmias  Rhythm:Regular     Neuro/Psych negative neurological ROS  negative psych ROS   GI/Hepatic negative GI ROS, Neg liver ROS,   Endo/Other  Morbid obesity  Renal/GU negative Renal ROS     Musculoskeletal  (+) Arthritis ,   Abdominal   Peds  Hematology negative hematology ROS (+)   Anesthesia Other Findings   Reproductive/Obstetrics                             Anesthesia Physical Anesthesia Plan  ASA: II  Anesthesia Plan: General   Post-op Pain Management:    Induction: Intravenous  Airway Management Planned: Oral ETT  Additional Equipment: None  Intra-op Plan:   Post-operative Plan: Extubation in OR  Informed Consent: I have reviewed the patients History and Physical, chart, labs and discussed the procedure including the risks, benefits and alternatives for the proposed anesthesia with the patient or authorized representative who has indicated his/her understanding and acceptance.   Dental advisory given  Plan Discussed with: CRNA  Anesthesia Plan Comments:         Anesthesia Quick Evaluation

## 2016-03-24 NOTE — Transfer of Care (Signed)
Immediate Anesthesia Transfer of Care Note  Patient: [name] [name]  Procedure(s) Performed: Procedure(s): LAPAROSCOPIC CHOLECYSTECTOMY WITH INTRAOPERATIVE CHOLANGIOGRAM (N/A)  Patient Location: PACU  Anesthesia Type:General  Level of Consciousness: awake, alert  and oriented  Airway & Oxygen Therapy: Patient Spontanous Breathing and Patient connected to face mask oxygen  Post-op Assessment: Report given to RN and Post -op Vital signs reviewed and stable  Post vital signs: Reviewed and stable  Last Vitals:  Filed Vitals:   03/23/16 2259 03/24/16 0644  BP:  142/78  Pulse:  86  Temp: 37.2 C 37.2 C  Resp:  18    Last Pain:  Filed Vitals:   03/24/16 0646  PainSc: 1       Patients Stated Pain Goal: 1 (03/23/16 1953)  Complications: No apparent anesthesia complications

## 2016-03-25 ENCOUNTER — Encounter (HOSPITAL_COMMUNITY): Payer: Self-pay | Admitting: General Surgery

## 2016-03-25 LAB — C-REACTIVE PROTEIN: CRP: 12.8 mg/dL — ABNORMAL HIGH (ref <1.0)

## 2016-03-25 MED ORDER — HYDROCODONE-ACETAMINOPHEN 5-325 MG PO TABS: 1.0000 | ORAL_TABLET | ORAL | Status: DC | PRN

## 2016-03-25 NOTE — Discharge Instructions (Signed)
Laparoscopic Cholecystectomy, Care After °Refer to this sheet in the next few weeks. These instructions provide you with information about caring for yourself after your procedure. Your health care provider may also give you more specific instructions. Your treatment has been planned according to current medical practices, but problems sometimes occur. Call your health care provider if you have any problems or questions after your procedure. °WHAT TO EXPECT AFTER THE PROCEDURE °After your procedure, it is common to have: °· Pain at your incision sites. You will be given pain medicines to control your pain. °· Mild nausea or vomiting. This should improve after the first 24 hours. °· Bloating and possible shoulder pain from the gas that was used during the procedure. This will improve after the first 24 hours. °HOME CARE INSTRUCTIONS °Incision Care °· Follow instructions from your health care provider about how to take care of your incisions. Make sure you: °¨ Wash your hands with soap and water before you change your bandage (dressing). If soap and water are not available, use hand sanitizer. °¨ Change your dressing as told by your health care provider. °¨ Leave stitches (sutures), skin glue, or adhesive strips in place. These skin closures may need to be in place for 2 weeks or longer. If adhesive strip edges start to loosen and curl up, you may trim the loose edges. Do not remove adhesive strips completely unless your health care provider tells you to do that. °· Do not take baths, swim, or use a hot tub until your health care provider approves. Ask your health care provider if you can take showers. You may only be allowed to take sponge baths for bathing. °General Instructions °· Take over-the-counter and prescription medicines only as told by your health care provider. °· Do not drive or operate heavy machinery while taking prescription pain medicine. °· Return to your normal diet as told by your health care  provider. °· Do not lift anything that is heavier than 10 lb (4.5 kg). °· Do not play contact sports for one week or until your health care provider approves. °SEEK MEDICAL CARE IF:  °· You have redness, swelling, or pain at the site of your incision. °· You have fluid, blood, or pus coming from your incision. °· You notice a bad smell coming from your incision area. °· Your surgical incisions break open. °· You have a fever. °SEEK IMMEDIATE MEDICAL CARE IF: °· You develop a rash. °· You have difficulty breathing. °· You have chest pain. °· You have increasing pain in your shoulders (shoulder strap areas). °· You faint or have dizzy episodes while you are standing. °· You have severe pain in your abdomen. °· You have nausea or vomiting that lasts for more than one day. °  °This information is not intended to replace advice given to you by your health care provider. Make sure you discuss any questions you have with your health care provider. °  °Document Released: 09/30/2005 Document Revised: 06/21/2015 Document Reviewed: 05/12/2013 °Elsevier Interactive Patient Education ©2016 [employer] °CCS ______[facility] °LAPAROSCOPIC SURGERY: POST OP INSTRUCTIONS °Always review your discharge instruction sheet given to you by the facility where your surgery was performed. °IF YOU HAVE DISABILITY OR FAMILY LEAVE FORMS, YOU MUST BRING THEM TO THE OFFICE FOR PROCESSING.   °DO NOT GIVE THEM TO YOUR DOCTOR. ° °1. A prescription for pain medication may be given to you upon discharge.  Take your pain medication as prescribed, if needed.  If narcotic   pain medicine is not needed, then you may take acetaminophen (Tylenol) or ibuprofen (Advil) as needed. °2. Take your usually prescribed medications unless otherwise directed. °3. If you need a refill on your pain medication, please contact your pharmacy.  They will contact our office to request authorization. Prescriptions will not be filled after 5pm or on  week-ends. °4. You should follow a light diet the first few days after arrival home, such as soup and crackers, etc.  Be sure to include lots of fluids daily. °5. Most patients will experience some swelling and bruising in the area of the incisions.  Ice packs will help.  Swelling and bruising can take several days to resolve.  °6. It is common to experience some constipation if taking pain medication after surgery.  Increasing fluid intake and taking a stool softener (such as Colace) will usually help or prevent this problem from occurring.  A mild laxative (Milk of Magnesia or Miralax) should be taken according to package instructions if there are no bowel movements after 48 hours. °7. Unless discharge instructions indicate otherwise, you may remove your bandages 24-48 hours after surgery, and you may shower at that time.  You may have steri-strips (small skin tapes) in place directly over the incision.  These strips should be left on the skin for 7-10 days.  If your surgeon used skin glue on the incision, you may shower in 24 hours.  The glue will flake off over the next 2-3 weeks.  Any sutures or staples will be removed at the office during your follow-up visit. °8. ACTIVITIES:  You may resume regular (light) daily activities beginning the next day--such as daily self-care, walking, climbing stairs--gradually increasing activities as tolerated.  You may have sexual intercourse when it is comfortable.  Refrain from any heavy lifting or straining until approved by your doctor. °a. You may drive when you are no longer taking prescription pain medication, you can comfortably wear a seatbelt, and you can safely maneuver your car and apply brakes. °b. RETURN TO WORK:  __________________________________________________________ °9. You should see your doctor in the office for a follow-up appointment approximately 2-3 weeks after your surgery.  Make sure that you call for this appointment within a day or two after you  arrive home to insure a convenient appointment time. °10. OTHER INSTRUCTIONS: __________________________________________________________________________________________________________________________ __________________________________________________________________________________________________________________________ °WHEN TO CALL YOUR DOCTOR: °1. Fever over 101.0 °2. Inability to urinate °3. Continued bleeding from incision. °4. Increased pain, redness, or drainage from the incision. °5. Increasing abdominal pain ° °The clinic staff is available to answer your questions during regular business hours.  Please don’t hesitate to call and ask to speak to one of the nurses for clinical concerns.  If you have a medical emergency, go to the nearest emergency room or call 911.  A surgeon from [facility] is always on call at the hospital. °[address], [address], [place], [place]  [address] ? [address] [address], [place], [place]   [address] °[phone] ? [phone] ? FAX [phone] °Web site: [email or web address] ° °

## 2016-03-25 NOTE — Progress Notes (Signed)
1 Day Post-Op  Subjective: He feels pretty good, has walked, has not tried oral pain med.  Sites look fine and he is voiding well.    Objective: Vital signs in last 24 hours: Temp:  [97.8 F (36.6 C)-98.9 F (37.2 C)] 97.9 F (36.6 C) (06/12 0806) Pulse Rate:  [65-98] 98 (06/12 0806) Resp:  [12-20] 19 (06/12 0806) BP: (138-172)/(66-101) 155/88 mmHg (06/12 0806) SpO2:  [95 %-100 %] 95 % (06/12 0806) Last BM Date: 03/24/16 720 PO Urine 1250 Afebrile, VSS BP up some No labs  Intake/Output from previous day: 06/11 0701 - 06/12 0700 In: 2220 [P.O.:720; I.V.:1500] Out: 1300 [Urine:1250; Blood:50] Intake/Output this shift: Total I/O In: 240 [P.O.:240] Out: -   General appearance: alert, cooperative and no distress Resp: clear to auscultation bilaterally GI: soft, sore, sites look good.  Lab Results:   Recent Labs  03/23/16 0528  WBC 9.9  HGB 12.6*  HCT 38.4*  PLT 181    BMET  Recent Labs  03/23/16 0528  NA 139  K 3.7  CL 108  CO2 25  GLUCOSE 98  BUN 12  CREATININE 0.94  CALCIUM 8.2*   PT/INR No results for input(s): LABPROT, INR in the last 72 hours.   Recent Labs Lab 03/21/16 0841 03/22/16 0515 03/23/16 0528  AST 91* 44* 18  ALT 42 38 23  ALKPHOS 130* 124 91  BILITOT 1.6* 2.2* 1.4*  PROT 7.6 7.1 6.4*  ALBUMIN 4.2 3.8 3.3*     Lipase     Component Value Date/Time   LIPASE 44 03/23/2016 0528     Studies/Results: Dg Cholangiogram Operative  03/24/2016  CLINICAL DATA:  Intraoperative cholangiogram. Evaluate for common bile duct stones. FLUOROSCOPY TIME:  45 seconds. EXAM: INTRAOPERATIVE CHOLANGIOGRAM TECHNIQUE: Cholangiographic images from the C-arm fluoroscopic device were submitted for interpretation post-operatively. Please see the procedural report for the amount of contrast and the fluoroscopy time utilized. COMPARISON:  None. FINDINGS: No filling defects are seen within the common bile duct by the end of the study. IMPRESSION: No filling  defects are seen in the common bile duct by the end of the study. Electronically Signed   By: Gerome Samavid  Williams III M.D   On: 03/24/2016 11:01   Prior to Admission medications   Medication Sig Start Date End Date Taking? Authorizing Provider  acetaminophen (TYLENOL) 500 MG tablet Take 1,000 mg by mouth every 6 (six) hours as needed for mild pain or headache.   Yes Historical Provider, MD  calcium citrate-vitamin D (CITRACAL+D) 315-200 MG-UNIT per tablet Take 1 tablet by mouth 2 (two) times daily.    Yes Historical Provider, MD  CALCIUM PO Take 1 tablet by mouth daily.   Yes Historical Provider, MD  HYDROcodone-acetaminophen (NORCO/VICODIN) 5-325 MG tablet Take 1 tablet by mouth every 12 (twelve) hours as needed. pain 03/04/16  Yes Historical Provider, MD  ibuprofen (ADVIL,MOTRIN) 200 MG tablet Take 200-400 mg by mouth every 6 (six) hours as needed for moderate pain.   Yes Historical Provider, MD  Multiple Vitamin (MULTIVITAMIN WITH MINERALS) TABS Take 1 tablet by mouth daily.    Yes Historical Provider, MD  SYNVISC ONE 48 MG/6ML SOSY Inject 6 mLs into the skin once. Received on 03/15/2016 03/15/16  Yes Historical Provider, MD  vitamin B-12 (CYANOCOBALAMIN) 1000 MCG tablet Take 1,000 mcg by mouth daily.   Yes Historical Provider, MD     Medications: . ciprofloxacin  400 mg Intravenous Q12H  . enoxaparin (LOVENOX) injection  40 mg Subcutaneous Q24H   .  0.9 % NaCl with KCl 20 mEq / L 75 mL/hr at 03/23/16 1509   Morbid obesity s/p Gastric sleeve 2014 OSA Spinal stenosis Hypertension arthritis Chronic kidney disease   Assessment/Plan Acute calculous cholecystitis Gallstone pancreatitis S/p laparoscopic cholecystectomy with IOC, 03/24/16, Dr. Ezzard Standing FEN:  Soft diet/IV fluids ID:  Day 5 Cipro DVT:  Lovenox/SCD  Plan:  If he does well with lunch home later today.     LOS: 3 days    JENNINGS,WILLARD 03/25/2016 [phone]  Agree with above. He plans to go home today.  Ovidio Kin,  MD, [facility] Pager: [phone] Office phone:  [phone]

## 2016-03-25 NOTE — Progress Notes (Signed)
Pt discharged from the unit via wheelchair. Discharge instructions reviewed with the pt. No questions or concerns from the pt at this time.  Cort Dragoo W Earsel Shouse, RN

## 2016-03-25 NOTE — Progress Notes (Signed)
Pt d/c instructions and scripts given with no questions/concerns voiced by pt.  Pt awaiting his wife to arrive to transport him home.

## 2016-03-25 NOTE — Discharge Summary (Signed)
Physician Discharge Summary  Patient ID: [name] [name] MRN: [id number] DOB/AGE: 11/11/1955 60 y.o.  Admit date: 03/21/2016 Discharge date: 03/25/2016  Admission Diagnoses:  Acute calculous cholecystitis Gallstone pancreatitis CBD and hepatic ductal dilatation H/o gastric sleeve - Dr. Biagio QuintLayton 11/2012 Morbid obesity   Discharge Diagnoses:  Same  Active Problems:   Biliary calculus with acute cholecystitis   PROCEDURES: S/p laparoscopic cholecystectomy with IOC, 03/24/16, Dr. Carie CaddyPaul Toth  Hospital Course:  60 y/o morbidly obese male with hx of gastric sleeve 2014 Dr. Biagio QuintLayton, anemia, arthritis, spinal stenosis, HTN, kidney stones requiring stenting, [facility], RUQ abdominal and epigastric abdominal pain radiating to the mid-back. He states this began 2 weeks ago, but at the time it was just back/flank pain, but the pain worsened yesterday and through the night. States pain is sharp in nature. States he has associated nausea and vomiting. No fever, chills, sweats. No CP/SOB, changes in bowel/bladder habits. Does admit to dark urine. States hard to get comfortable, he thought it was his kidney stones again. States he did requiring stenting in the past for his kidney stones due to size. No precipitating/alleviating factors. No other abdominal surgeries. He has difficult mobilizing due to back pain from spinal stenosis and he walks with a walker. Relatively good exercise tolerance. He walks on the treadmill each day, but hasn't been able to for the last 2 weeks due to back pain.  He was admitted and placed on fluids and bowel rest.  His symptoms from pancreatitis improved and he was taken to the OR on 03/24/16.  Post op he has done well, tolerating diet and ambulating.  He was ready to go home the following AM.  He was seen by Dr. Ezzard StandingNewman and has some issues with his weight loss.  Dr. Ezzard StandingNewman has agreed to follow up and help with his weight loss issues.     Condition on d/C:  Improved  CBC Latest Ref Rng 03/23/2016 03/22/2016 03/21/2016  WBC 4.0 - 10.5 K/uL 9.9 7.4 7.6  Hemoglobin 13.0 - 17.0 g/dL 12.6(L) 13.4 14.1  Hematocrit 39.0 - 52.0 % 38.4(L) 41.2 41.3  Platelets 150 - 400 K/uL 181 200 180   CMP Latest Ref Rng 03/23/2016 03/22/2016 03/21/2016  Glucose 65 - 99 mg/dL 98 409(W120(H) -  BUN 6 - 20 mg/dL 12 14 -  Creatinine 1.190.61 - 1.24 mg/dL 1.470.94 8.291.05 5.620.92  Sodium 135 - 145 mmol/L 139 139 -  Potassium 3.5 - 5.1 mmol/L 3.7 3.8 -  Chloride 101 - 111 mmol/L 108 107 -  CO2 22 - 32 mmol/L 25 26 -  Calcium 8.9 - 10.3 mg/dL 8.2(L) 8.6(L) -  Total Protein 6.5 - 8.1 g/dL 6.4(L) 7.1 -  Total Bilirubin 0.3 - 1.2 mg/dL 1.3(Y1.4(H) 2.2(H) -  Alkaline Phos 38 - 126 U/L 91 124 -  AST 15 - 41 U/L 18 44(H) -  ALT 17 - 63 U/L 23 38 -   Lipase:  1217 on 03/21/17   Lipase:  44 on 03/22/16  Disposition: 01-Home or Self Care     Medication List    TAKE these medications        acetaminophen 500 MG tablet  Commonly known as:  TYLENOL  Take 1,000 mg by mouth every 6 (six) hours as needed for mild pain or headache.     calcium citrate-vitamin D 315-200 MG-UNIT tablet  Commonly known as:  CITRACAL+D  Take 1 tablet by mouth 2 (two) times daily.  CALCIUM PO  Take 1 tablet by mouth daily.     HYDROcodone-acetaminophen 5-325 MG tablet  Commonly known as:  NORCO/VICODIN  Take 1-2 tablets by mouth every 4 (four) hours as needed for moderate pain.     ibuprofen 200 MG tablet  Commonly known as:  ADVIL,MOTRIN  Take 200-400 mg by mouth every 6 (six) hours as needed for moderate pain.     multivitamin with minerals Tabs tablet  Take 1 tablet by mouth daily.     SYNVISC ONE 48 MG/6ML Sosy  Generic drug:  Hylan  Inject 6 mLs into the skin once. Received on 03/15/2016     vitamin B-12 1000 MCG tablet  Commonly known as:  CYANOCOBALAMIN  Take 1,000 mcg by mouth daily.           Follow-up Information    Follow up with Katy Apo, MD.   Specialty:   Internal Medicine   Why:  Call for follow up and any medical issues.  Let him know about your surgery.   Contact information:   [address] [place] [address] [phone]       Follow up with Kandis Cocking, MD On 04/03/2016.   Specialty:  General Surgery   Why:  Call and make an appointment for follow up in 2 weeks.   Contact information:   [address] [place] [address] [phone]       Signed: Sherrie George 03/25/2016, 12:18 PM  I agree with above. He has done well from the gall bladder surgery.  I also talked to him about the C. Dif study and he will look into it.  He also has had trouble with his lap sleeve gastrectomy and I said that I would see him back to talk about weight loss - he had a sleeve by Dr. Biagio Quint.  We can get him to see the dietitian to get him back on course.  Ovidio Kin, MD, [facility] Surgery Pager: [phone] Office phone:  [phone]

## 2016-03-25 NOTE — Care Management Note (Signed)
Case Management Note  Patient Details  Name: [name] [name] MRN: [id number] Date of Birth: 25-Apr-1956  Subjective/Objective:                    Action/Plan:d/c home no needs or orders.   Expected Discharge Date:   (unknown)               Expected Discharge Plan:  Home/Self Care  In-House Referral:     Discharge planning Services  CM Consult  Post Acute Care Choice:    Choice offered to:     DME Arranged:    DME Agency:     HH Arranged:    HH Agency:     Status of Service:  Completed, signed off  Medicare Important Message Given:    Date Medicare IM Given:    Medicare IM give by:    Date Additional Medicare IM Given:    Additional Medicare Important Message give by:     If discussed at Long Length of Stay Meetings, dates discussed:    Additional Comments:  Lanier ClamMahabir, Desteni Piscopo, RN 03/25/2016, 1:11 PM

## 2016-03-26 LAB — MRSA CULTURE: CULTURE: NOT DETECTED

## 2016-05-27 ENCOUNTER — Other Ambulatory Visit: Payer: Self-pay | Admitting: Physical Medicine and Rehabilitation

## 2016-05-27 DIAGNOSIS — M545 Low back pain: Secondary | ICD-10-CM

## 2016-06-03 ENCOUNTER — Encounter (HOSPITAL_COMMUNITY): Payer: Self-pay

## 2016-06-18 ENCOUNTER — Ambulatory Visit
Admission: RE | Admit: 2016-06-18 | Discharge: 2016-06-18 | Disposition: A | Discharge status: Home / Self Care | Payer: No Typology Code available for payment source | Source: Ambulatory Visit | Attending: Physical Medicine and Rehabilitation | Admitting: Physical Medicine and Rehabilitation

## 2016-06-18 DIAGNOSIS — M545 Low back pain: Secondary | ICD-10-CM

## 2016-07-19 ENCOUNTER — Encounter (HOSPITAL_COMMUNITY): Payer: Self-pay

## 2016-09-23 ENCOUNTER — Telehealth (INDEPENDENT_AMBULATORY_CARE_PROVIDER_SITE_OTHER): Payer: Self-pay | Admitting: Physical Medicine and Rehabilitation

## 2016-09-26 ENCOUNTER — Telehealth (INDEPENDENT_AMBULATORY_CARE_PROVIDER_SITE_OTHER): Payer: Self-pay

## 2016-09-26 NOTE — Telephone Encounter (Signed)
Right L5 and S1 tf esi

## 2016-09-26 NOTE — Telephone Encounter (Signed)
Routed to Amy for auth.

## 2016-09-26 NOTE — Telephone Encounter (Signed)
Patient left vm requesting another injection. Had MRI 06/19/16. Last injection was 01/29/16 for Rt S1 Tf. Repeat?

## 2016-09-26 NOTE — Telephone Encounter (Signed)
Called [facility] and per automated system no precert required. Tried pulling up on [employer] website to see if handled by them and they do not, Pt scheduled for 09/30/16.

## 2016-09-30 ENCOUNTER — Encounter (INDEPENDENT_AMBULATORY_CARE_PROVIDER_SITE_OTHER): Payer: Self-pay | Admitting: Physical Medicine and Rehabilitation

## 2016-09-30 ENCOUNTER — Ambulatory Visit (INDEPENDENT_AMBULATORY_CARE_PROVIDER_SITE_OTHER): Payer: No Typology Code available for payment source | Admitting: Physical Medicine and Rehabilitation

## 2016-09-30 VITALS — BP 163/90 | HR 68 | Temp 97.9°F

## 2016-09-30 DIAGNOSIS — M5416 Radiculopathy, lumbar region: Secondary | ICD-10-CM | POA: Diagnosis not present

## 2016-09-30 MED ORDER — METHYLPREDNISOLONE ACETATE 80 MG/ML IJ SUSP
80.0000 mg | Freq: Once | INTRAMUSCULAR | Status: AC
  Administered 2016-09-30: 80 mg

## 2016-09-30 MED ORDER — LIDOCAINE HCL (PF) 1 % IJ SOLN: 0.3300 mL | Freq: Once | INTRAMUSCULAR | Status: DC

## 2016-09-30 MED ORDER — HYDROCODONE-ACETAMINOPHEN 5-325 MG PO TABS
1.0000 | ORAL_TABLET | Freq: Three times a day (TID) | ORAL | 0 refills | Status: DC | PRN
Start: 2016-09-30 — End: 2016-12-05

## 2016-09-30 NOTE — Patient Instructions (Signed)

## 2016-09-30 NOTE — Procedures (Signed)
Lumbosacral Transforaminal Epidural Steroid Injection - Infraneural Approach with Fluoroscopic Guidance  Patient: [name] [name]      Date of Birth: 03/29/1956 MRN: [id number] PCP: Katy ApoPOLITE,RONALD D, MD      Visit Date: 09/30/2016   Universal Protocol:    Date/Time: 12/18/178:54 AM  Consent Given By: the patient  Position: PRONE   Additional Comments: Vital signs were monitored before and after the procedure. Patient was prepped and draped in the usual sterile fashion. The correct patient, procedure, and site was verified.   Injection Procedure Details:  Procedure Site One Meds Administered:  Meds ordered this encounter  Medications  . lidocaine (PF) (XYLOCAINE) 1 % injection 0.3 mL  . methylPREDNISolone acetate (DEPO-MEDROL) injection 80 mg      Laterality: Right  Location/Site:  L5-S1 S1-2  Needle size: 22 G  Patient does require a 6 inch spinal needle for the L5 placement  Needle type: Spinal  Needle Placement: Transforaminal  Findings:  -Contrast Used: 1 mL iohexol 180 mg iodine/mL   -Comments: Excellent flow of contrast along the nerve and into the epidural space.  Procedure Details: After squaring off the end-plates of the desired vertebral level to get a true AP view, the C-arm was obliqued to the painful side so that the superior articulating process is positioned about 1/3 the length of the inferior endplate.  The needle was aimed toward the junction of the superior articular process and the transverse process of the inferior vertebrae. The needle's initial entry is in the lower third of the foramen through Kambin's triangle. The soft tissues overlying this target were infiltrated with 2-3 ml. of 1% Lidocaine without Epinephrine.  The spinal needle was then inserted and advanced toward the target using a "trajectory" view along the fluoroscope beam.  Under AP and lateral visualization, the needle was advanced so it did not puncture dura and did not traverse  medially beyond the 6 o'clock position of the pedicle. Bi-planar projections were used to confirm position. Aspiration was confirmed to be negative for CSF and/or blood. A 1-2 ml. volume of Isovue-250 was injected and flow of contrast was noted at each level. Radiographs were obtained for documentation purposes.   After attaining the desired flow of contrast documented above, a 0.5 to 1.0 ml test dose of 0.25% Marcaine was injected into each respective transforaminal space.  The patient was observed for 90 seconds post injection.  After no sensory deficits were reported, and normal lower extremity motor function was noted,   the above injectate was administered so that equal amounts of the injectate were placed at each foramen (level) into the transforaminal epidural space.   Additional Comments:  The patient tolerated the procedure well Dressing: Band-Aid    Post-procedure details: Patient was observed during the procedure. Post-procedure instructions were reviewed.  Patient left the clinic in stable condition.

## 2016-09-30 NOTE — Progress Notes (Signed)
[name] Junrnest Manning - 60 y.o. male MRN [id number]  Date of birth: [birth year]  Office Visit Note: Visit Date: 09/30/2016 PCP: Katy ApoPOLITE,RONALD D, MD Referred by: Renford DillsPolite, Ronald, MD  Subjective: Chief Complaint  Patient presents with  . Lower Back - Pain   HPI: [name] is a 60 year old gentleman who is well-known to us with history of multilevel stenosis status post laminectomies and decompression. He's had continued right hip and leg pain since that time. He intermittently gets good relief with epidural injection from an S1 approach. He's battled obesity and morbid obesity for some time. He comes in today tell me that unfortunately has gained a lot of his weight back. He still not as big as he was only first saw him. He is still ambulating with a cane. He still tries to maintain some activity level and strengthening. When we originally saw him he had bad problems with his knees and he has continued to work this orthopedic complaints. He is present today with his wife who provided some of the history. He's had no focal weakness or trauma. He has had this worsening right low back and leg pain. A repeat MRI was completed since I last saw him. This is reviewed below. This does show a level of narrowing on the right at L4-5. He is multilevel degenerative disc changes and facet hypertrophy. He does not have any central canal stenosis at this point. He's had no groin pain. He continues to take some medications at times. He has had hydrocodone in the past and is something that off and on I have given him a very small amount. He does ask for a refill medication of hydrocodone today. I have not seen him in quite some time and we did review the controlled substance list and I will give him a short-term prescription. He has tried other medications with no relief. He has had multiple bouts of physical therapy just not recently.    Driver present, no blood thinners, no contrast allergy. Right sided lower back and right  leg pain. No numbness or tingling now. Pain is at a 2 or 3. Walking/ movement makes it worse. Ambulates with cane. Review of Systems  Constitutional: Negative for chills, fever, malaise/fatigue and weight loss.  HENT: Negative for hearing loss and sinus pain.   Eyes: Negative for blurred vision, double vision and photophobia.  Respiratory: Negative for cough and shortness of breath.   Cardiovascular: Negative for chest pain, palpitations and leg swelling.  Gastrointestinal: Negative for abdominal pain, nausea and vomiting.  Genitourinary: Negative for flank pain.  Musculoskeletal: Positive for back pain and joint pain. Negative for myalgias.  Skin: Negative for itching and rash.  Neurological: Negative for tremors, focal weakness and weakness.  Endo/Heme/Allergies: Negative.   Psychiatric/Behavioral: Negative for depression.  All other systems reviewed and are negative.  Otherwise per HPI.  Assessment & Plan: Visit Diagnoses:  1. Lumbar radiculopathy   2. Morbid (severe) obesity due to excess calories (HCC)     Plan: Findings:  Chronic pain syndrome and postlaminectomy syndrome and chronic worsening left hip and leg pain consistent with an L5 and S1-type distribution. New MRI does show changes at that level. He has nothing with focal compression however. I am going to complete a left L5 and S1 transforaminal injection today due to the severity of his symptoms and the fact that he is asking for opioid medication. I did give him a 7 day supply of opioid to hopefully help him until the injection  does seem to help. I did encourage weight loss and we did talk about dietary restrictions and low carbohydrate dieting. I encouraged him to stay active. I spent more than 25 minutes speaking face-to-face with the patient with 50% of the time in counseling.    Meds & Orders:  Meds ordered this encounter  Medications  . lidocaine (PF) (XYLOCAINE) 1 % injection 0.3 mL  . methylPREDNISolone acetate  (DEPO-MEDROL) injection 80 mg  . HYDROcodone-acetaminophen (NORCO/VICODIN) 5-325 MG tablet    Sig: Take 1 tablet by mouth every 8 (eight) hours as needed for moderate pain.    Dispense:  21 tablet    Refill:  0    Orders Placed This Encounter  Procedures  . Epidural Steroid injection    Follow-up: Return if symptoms worsen or fail to improve, 2 weeks.   Procedures: No procedures performed  Lumbosacral Transforaminal Epidural Steroid Injection - Infraneural Approach with Fluoroscopic Guidance  Patient: [name] [name]      Date of Birth: 1956-04-26 MRN: [id number] PCP: Katy ApoPOLITE,RONALD D, MD      Visit Date: 09/30/2016   Universal Protocol:    Date/Time: 12/18/178:54 AM  Consent Given By: the patient  Position: PRONE   Additional Comments: Vital signs were monitored before and after the procedure. Patient was prepped and draped in the usual sterile fashion. The correct patient, procedure, and site was verified.   Injection Procedure Details:  Procedure Site One Meds Administered:  Meds ordered this encounter  Medications  . lidocaine (PF) (XYLOCAINE) 1 % injection 0.3 mL  . methylPREDNISolone acetate (DEPO-MEDROL) injection 80 mg      Laterality: Right  Location/Site:  L5-S1 S1-2  Needle size: 22 G  Patient does require a 6 inch spinal needle for the L5 placement  Needle type: Spinal  Needle Placement: Transforaminal  Findings:  -Contrast Used: 1 mL iohexol 180 mg iodine/mL   -Comments: Excellent flow of contrast along the nerve and into the epidural space.  Procedure Details: After squaring off the end-plates of the desired vertebral level to get a true AP view, the C-arm was obliqued to the painful side so that the superior articulating process is positioned about 1/3 the length of the inferior endplate.  The needle was aimed toward the junction of the superior articular process and the transverse process of the inferior vertebrae. The needle's initial entry is  in the lower third of the foramen through Kambin's triangle. The soft tissues overlying this target were infiltrated with 2-3 ml. of 1% Lidocaine without Epinephrine.  The spinal needle was then inserted and advanced toward the target using a "trajectory" view along the fluoroscope beam.  Under AP and lateral visualization, the needle was advanced so it did not puncture dura and did not traverse medially beyond the 6 o'clock position of the pedicle. Bi-planar projections were used to confirm position. Aspiration was confirmed to be negative for CSF and/or blood. A 1-2 ml. volume of Isovue-250 was injected and flow of contrast was noted at each level. Radiographs were obtained for documentation purposes.   After attaining the desired flow of contrast documented above, a 0.5 to 1.0 ml test dose of 0.25% Marcaine was injected into each respective transforaminal space.  The patient was observed for 90 seconds post injection.  After no sensory deficits were reported, and normal lower extremity motor function was noted,   the above injectate was administered so that equal amounts of the injectate were placed at each foramen (level) into the transforaminal epidural space.  Additional Comments:  The patient tolerated the procedure well Dressing: Band-Aid    Post-procedure details: Patient was observed during the procedure. Post-procedure instructions were reviewed.  Patient left the clinic in stable condition.     Clinical History: Lumbar spine MRI dated 06/20/2016  L1-2: Moderate facet hypertrophy has progressed. Mild left subarticular and moderate left foraminal narrowing is stable. Mild right foraminal narrowing is present.  L2-3: A broad-based disc protrusion is asymmetric to the left. Moderate facet hypertrophy has progressed, left greater than right. Moderate left subarticular and foraminal narrowing is present. Mild right subarticular and foraminal narrowing is noted.  L3-4: A  broad-based disc protrusion is present. The patient is status post laminectomy. Advanced facet hypertrophy has progressed. Mild subarticular narrowing is worse on the right. Mild foraminal narrowing is present bilaterally.  L4-5: A wide laminectomy is noted. The central canal is decompressed. Residual recurrent rightward disc protrusion has progressed. The posterior canal is open. Moderate right subarticular stenosis is present. Severe right and moderate left foraminal narrowing is noted. This has progressed.  L5-S1: A wide laminectomy is present. Progressive facet degenerative changes are noted. Moderate left and mild right foraminal stenosis has progressed.  He reports that he quit smoking about 42 years ago. His smoking use included Cigarettes. He has never used smokeless tobacco. No results for input(s): HGBA1C, LABURIC in the last 8760 hours.  Objective:  VS:  HT:    WT:   BMI:     BP:(!) 163/90  HR:68bpm  TEMP:97.9 F (36.6 C)(Oral)  RESP:99 % Physical Exam  Constitutional: He is oriented to person, place, and time. He appears well-developed and well-nourished. No distress.  Obese  HENT:  Head: Normocephalic and atraumatic.  Eyes: Conjunctivae are normal. Pupils are equal, round, and reactive to light.  Cardiovascular: Regular rhythm and intact distal pulses.   Pulmonary/Chest: Effort normal and breath sounds normal.  Musculoskeletal:  The patient ambulates with a walker. He does ambulate with a forward flexed spine. He has good distal strength.  Neurological: He is alert and oriented to person, place, and time.  Skin: Skin is warm.  Chronic skin condition with some small open areas of disorders. There is no sign of infection or drainage.  Psychiatric: He has a normal mood and affect.    Ortho Exam Imaging: No results found.  Past Medical/Family/Surgical/Social History: Medications & Allergies reviewed per EMR Patient Active Problem List   Diagnosis Date Noted    . Lumbar radiculopathy 09/30/2016  . Morbid (severe) obesity due to excess calories (HCC) 09/30/2016  . Biliary calculus with acute cholecystitis 03/21/2016  . Acute pyelonephritis 12/28/2013  . Ureteral stone with hydronephrosis 12/28/2013  . Acute kidney failure (HCC) 12/27/2013  . Severe sepsis with acute organ dysfunction (HCC) 12/27/2013  . Hypotension 12/27/2013  . Lactic acidosis 12/27/2013  . Sepsis secondary to UTI (HCC) 12/27/2013  . EMPYEMA CHEST 08/30/2008  . PRURIGO 08/30/2008  . OBSTRUCTIVE SLEEP APNEA 08/30/2008   Past Medical History:  Diagnosis Date  . Anemia   . Arthritis   . Chronic kidney disease    HX acute kidney failure / acute pyelonephritis / hydronephrosis / severe sepsis per discharge summary 12/27/13  . History of kidney stones   . Hypertension   . Morbid obesity (HCC)   . Obstructive sleep apnea    does not need c pap since 110 lb wt loss  . Osteoporosis   . Prurigo 2002  . Scars    ON ARMS FROM CHEMICAL EXPLOSION 1999  .  Spinal stenosis    History reviewed. No pertinent family history. Past Surgical History:  Procedure Laterality Date  . BACK SURGERY  01/23/2010   lumbar  . CHOLECYSTECTOMY N/A 03/24/2016   Procedure: LAPAROSCOPIC CHOLECYSTECTOMY WITH INTRAOPERATIVE CHOLANGIOGRAM;  Surgeon: Chevis Pretty III, MD;  Location: [facility];  Service: General;  Laterality: N/A;  . CIRCUMCISION    . CYSTOSCOPY WITH RETROGRADE PYELOGRAM, URETEROSCOPY AND STENT PLACEMENT Left 01/20/2014   Procedure: CYSTOSCOPY WITH RETROGRADE PYELOGRAM, URETEROSCOPY AND STENT EXCHANGE;  Surgeon: Milford Cage, MD;  Location: [facility];  Service: Urology;  Laterality: Left;  bugbee bladder fulguration  . CYSTOSCOPY WITH STENT PLACEMENT Left 12/28/2013   Procedure: CYSTOSCOPY WITH STENT PLACEMENT left retrograde pyleogram;  Surgeon: Milford Cage, MD;  Location: [facility];  Service: Urology;  Laterality: Left;  . ESOPHAGOGASTRODUODENOSCOPY N/A 12/07/2012   Procedure:  ESOPHAGOGASTRODUODENOSCOPY (EGD);  Surgeon: Lodema Pilot, DO;  Location: [facility];  Service: General;  Laterality: N/A;  . HOLMIUM LASER APPLICATION Left 01/20/2014   Procedure: HOLMIUM LASER APPLICATION;  Surgeon: Milford Cage, MD;  Location: [facility];  Service: Urology;  Laterality: Left;  . LAPAROSCOPIC GASTRIC SLEEVE RESECTION N/A 12/07/2012   Procedure: LAPAROSCOPIC GASTRIC SLEEVE RESECTION;  Surgeon: Lodema Pilot, DO;  Location: [facility];  Service: General;  Laterality: N/A;  laparoscopic sleeve gastrectomy with EGD   Social History   Occupational History  . Not on file.   Social History Main Topics  . Smoking status: Former Smoker    Types: Cigarettes    Quit date: 04/09/1974  . Smokeless tobacco: Never Used  . Alcohol use No  . Drug use: No  . Sexual activity: Not on file

## 2016-10-15 ENCOUNTER — Ambulatory Visit (INDEPENDENT_AMBULATORY_CARE_PROVIDER_SITE_OTHER): Payer: No Typology Code available for payment source | Admitting: Orthopaedic Surgery

## 2016-10-15 DIAGNOSIS — M25562 Pain in left knee: Secondary | ICD-10-CM | POA: Diagnosis not present

## 2016-10-15 DIAGNOSIS — M1712 Unilateral primary osteoarthritis, left knee: Secondary | ICD-10-CM

## 2016-10-15 DIAGNOSIS — G8929 Other chronic pain: Secondary | ICD-10-CM

## 2016-10-15 MED ORDER — METHYLPREDNISOLONE ACETATE 40 MG/ML IJ SUSP
40.0000 mg | INTRAMUSCULAR | Status: AC | PRN
  Administered 2016-10-15: 40 mg via INTRA_ARTICULAR

## 2016-10-15 MED ORDER — LIDOCAINE HCL 1 % IJ SOLN
3.0000 mL | INTRAMUSCULAR | Status: AC | PRN
  Administered 2016-10-15: 3 mL

## 2016-10-15 NOTE — Progress Notes (Signed)
Office Visit Note   Patient: [name] [name]           Date of Birth: 07-08-1956           MRN: [id number] Visit Date: 10/15/2016              Requested by: Renford Dills, MD [address] [place], [place] [address] PCP: Katy Apo, MD   Assessment & Plan: Visit Diagnoses:  1. Chronic pain of left knee   2. Unilateral primary osteoarthritis, left knee     Plan: He tolerated the steroid injection well on his left knee. My next step would be I hyaluronic acid injection down the road if his pain comes back again. Worse case near would be a knee replacement but since conservative treatment is still managing well we will maintain this course.  Follow-Up Instructions: Return if symptoms worsen or fail to improve.   Orders:  Orders Placed This Encounter  Procedures  . Large Joint Injection/Arthrocentesis   No orders of the defined types were placed in this encounter.     Procedures: Large Joint Inj Date/Time: 10/15/2016 10:19 AM Performed by: [name] Authorized by: [name]   Location:  Knee Site:  L knee Ultrasound Guidance: No   Fluoroscopic Guidance: No   Arthrogram: No   Medications:  3 mL lidocaine 1 %; 40 mg methylPREDNISolone acetate 40 MG/ML     Clinical Data: No additional findings.   Subjective: No chief complaint on file. The patient is well-known to me. He has known moderate osteoarthritis and degenerative joint disease left knee. We last saw him in July 2017 and he had a Synvisc injection then that did help. He comes in today wanting to have a steroid injection in his left knee to help temporize recent symptoms. He actually had a fall on that knee a couple months ago where he had a large abrasion over his patella. He wanted to wait to that he'll before getting a steroid injection today.  HPI  Review of Systems  Negative for chest pain, headache, fever, chills, nausea, vomiting, shortness of  breath Objective: Vital Signs: There were no vitals taken for this visit.  Physical Exam His alert and oriented 3. Exam related with a rolling walker. Ortho Exam Image of his left knee shows a moderate varus deformity with patellofemoral crepitation but no effusion. His knee feels MC stable. Specialty Comments:  No specialty comments available.  Imaging: No results found.   PMFS History: Patient Active Problem List   Diagnosis Date Noted  . Lumbar radiculopathy 09/30/2016  . Morbid (severe) obesity due to excess calories (HCC) 09/30/2016  . Biliary calculus with acute cholecystitis 03/21/2016  . Acute pyelonephritis 12/28/2013  . Ureteral stone with hydronephrosis 12/28/2013  . Acute kidney failure (HCC) 12/27/2013  . Severe sepsis with acute organ dysfunction (HCC) 12/27/2013  . Hypotension 12/27/2013  . Lactic acidosis 12/27/2013  . Sepsis secondary to UTI (HCC) 12/27/2013  . EMPYEMA CHEST 08/30/2008  . PRURIGO 08/30/2008  . OBSTRUCTIVE SLEEP APNEA 08/30/2008   Past Medical History:  Diagnosis Date  . Anemia   . Arthritis   . Chronic kidney disease    HX acute kidney failure / acute pyelonephritis / hydronephrosis / severe sepsis per discharge summary 12/27/13  . History of kidney stones   . Hypertension   . Morbid obesity (HCC)   . Obstructive sleep apnea    does not need c pap since 110 lb wt loss  .  Osteoporosis   . Prurigo 2002  . Scars    ON ARMS FROM CHEMICAL EXPLOSION 1999  . Spinal stenosis     No family history on file.  Past Surgical History:  Procedure Laterality Date  . BACK SURGERY  01/23/2010   lumbar  . CHOLECYSTECTOMY N/A 03/24/2016   Procedure: LAPAROSCOPIC CHOLECYSTECTOMY WITH INTRAOPERATIVE CHOLANGIOGRAM;  Surgeon: Chevis PrettyPaul Toth III, MD;  Location: [facility];  Service: General;  Laterality: N/A;  . CIRCUMCISION    . CYSTOSCOPY WITH RETROGRADE PYELOGRAM, URETEROSCOPY AND STENT PLACEMENT Left 01/20/2014   Procedure: CYSTOSCOPY WITH RETROGRADE  PYELOGRAM, URETEROSCOPY AND STENT EXCHANGE;  Surgeon: Milford Cageaniel Young Woodruff, MD;  Location: [facility];  Service: Urology;  Laterality: Left;  bugbee bladder fulguration  . CYSTOSCOPY WITH STENT PLACEMENT Left 12/28/2013   Procedure: CYSTOSCOPY WITH STENT PLACEMENT left retrograde pyleogram;  Surgeon: Milford Cageaniel Young Woodruff, MD;  Location: [facility];  Service: Urology;  Laterality: Left;  . ESOPHAGOGASTRODUODENOSCOPY N/A 12/07/2012   Procedure: ESOPHAGOGASTRODUODENOSCOPY (EGD);  Surgeon: Lodema PilotBrian Layton, DO;  Location: [facility];  Service: General;  Laterality: N/A;  . HOLMIUM LASER APPLICATION Left 01/20/2014   Procedure: HOLMIUM LASER APPLICATION;  Surgeon: Milford Cageaniel Young Woodruff, MD;  Location: [facility];  Service: Urology;  Laterality: Left;  . LAPAROSCOPIC GASTRIC SLEEVE RESECTION N/A 12/07/2012   Procedure: LAPAROSCOPIC GASTRIC SLEEVE RESECTION;  Surgeon: Lodema PilotBrian Layton, DO;  Location: [facility];  Service: General;  Laterality: N/A;  laparoscopic sleeve gastrectomy with EGD   Social History   Occupational History  . Not on file.   Social History Main Topics  . Smoking status: Former Smoker    Types: Cigarettes    Quit date: 04/09/1974  . Smokeless tobacco: Never Used  . Alcohol use No  . Drug use: No  . Sexual activity: Not on file

## 2016-12-03 ENCOUNTER — Telehealth (INDEPENDENT_AMBULATORY_CARE_PROVIDER_SITE_OTHER): Payer: Self-pay

## 2016-12-03 NOTE — Telephone Encounter (Signed)
Patient left voice mail requesting a refill on his hydrocodone, I called him back to advise you were not in the office today and see how he did with the last inj. He says he had great relief  but fell while on vacation last week and has been in a great deal of pain since then.

## 2016-12-04 NOTE — Telephone Encounter (Signed)
I am not going to refill Hydrocodone, we need to OV to discuss pain mgt.

## 2016-12-04 NOTE — Telephone Encounter (Signed)
Scheduled for 12/05/16 at 1330.

## 2016-12-05 ENCOUNTER — Encounter (INDEPENDENT_AMBULATORY_CARE_PROVIDER_SITE_OTHER): Payer: Self-pay | Admitting: Physical Medicine and Rehabilitation

## 2016-12-05 ENCOUNTER — Ambulatory Visit (INDEPENDENT_AMBULATORY_CARE_PROVIDER_SITE_OTHER): Payer: No Typology Code available for payment source | Admitting: Physical Medicine and Rehabilitation

## 2016-12-05 VITALS — BP 161/89 | HR 83

## 2016-12-05 DIAGNOSIS — M961 Postlaminectomy syndrome, not elsewhere classified: Secondary | ICD-10-CM | POA: Diagnosis not present

## 2016-12-05 DIAGNOSIS — G8929 Other chronic pain: Secondary | ICD-10-CM | POA: Diagnosis not present

## 2016-12-05 DIAGNOSIS — M5416 Radiculopathy, lumbar region: Secondary | ICD-10-CM | POA: Diagnosis not present

## 2016-12-05 DIAGNOSIS — M5441 Lumbago with sciatica, right side: Secondary | ICD-10-CM

## 2016-12-05 DIAGNOSIS — M5442 Lumbago with sciatica, left side: Secondary | ICD-10-CM | POA: Diagnosis not present

## 2016-12-05 MED ORDER — PREDNISONE 50 MG PO TABS: ORAL_TABLET | ORAL | 0 refills | Status: DC

## 2016-12-05 MED ORDER — HYDROCODONE-ACETAMINOPHEN 5-325 MG PO TABS: 1.0000 | ORAL_TABLET | Freq: Three times a day (TID) | ORAL | 0 refills | Status: DC | PRN

## 2016-12-05 NOTE — Progress Notes (Signed)
[name] [name] - 61 y.o. male MRN [id number]  Date of birth: [birth year]  Office Visit Note: Visit Date: 12/05/2016 PCP: [name] Apo, MD Referred by: [name] Dills, MD  Subjective: Chief Complaint  Patient presents with  . Lower Back - Pain   HPI: [name] [name] is a 61 year old gentleman that I have known over many years. He was originally followed by Dr. Prince [name]. He has had a history of morbid obesity as well as significant osteoarthritis of the knees bilaterally and problems with his ankles. The biggest thing we've seen him for in the past was multilevel severe stenosis which she did have surgery in 2011 with wide decompression without fusion. He is actually done fairly well with that although he has had over the years right-sided low back and buttock pain that would periodically flareup. The last time we saw him was 09/30/2016 and completed an epidural injection that he said helped him quite a bit. We have actually updated his MRI and September 2017. This is reviewed below. He does have some lateral recess narrowing at L4-5 and some progressive changes in terms of facet arthropathy throughout but central canal is still decompressed. Nonetheless he comes in today after having a fall on Saturday. He says he fell to the right side and hit his lower back and now the pain is again radiating down the leg. He is numbness in the posterior lateral leg down to the foot and more of an L5 distribution. He feels like the symptoms are in the same area as before just more intense. He is ambulating with a walker and before he was using a cane. He is very slow to arise from a seated position. Unfortunately has gained a lot of weight back over the last couple of years since we've seen him. He denies any focal weakness or bowel or bladder difficulty this new. He has not had any fevers chills or night sweats. He denies any groin pain. He can ambulate with the cane and actually drove himself here today.   Review of  Systems  Constitutional: Negative for chills, fever, malaise/fatigue and weight loss.  HENT: Negative for hearing loss and sinus pain.   Eyes: Negative for blurred vision, double vision and photophobia.  Respiratory: Negative for cough and shortness of breath.   Cardiovascular: Negative for chest pain, palpitations and leg swelling.  Gastrointestinal: Negative for abdominal pain, nausea and vomiting.  Genitourinary: Negative for flank pain.  Musculoskeletal: Positive for back pain and falls. Negative for myalgias.  Skin: Negative for itching and rash.  Neurological: Negative for tremors, focal weakness and weakness.  Endo/Heme/Allergies: Negative.   Psychiatric/Behavioral: Negative for depression.  All other systems reviewed and are negative.  Otherwise per HPI.  Assessment & Plan: Visit Diagnoses:  1. Lumbar radiculopathy   2. Post laminectomy syndrome   3. Chronic bilateral low back pain with bilateral sciatica     Plan: Findings:  Chronic worsening severe right radicular leg pain and more of an L5 distribution. MRI from 2017 does show residual recurrent right disc protrusion with facet arthropathy and moderate right lateral recess stenosis and fairly severe foraminal stenosis. He actually did well with epidural injection below this area but began starting hurting again after a fall. This was a ground-level fall and he is bearing weight. Examination does not show any signs of fracture at this point. I did not get x-rays today as they just were not warranted. No point tenderness he can ambulate and a bare body weight. He is  a large individual. At this point I want to give him prednisone for just a few days to see if that calms things down. I also want to refill his hydrocodone. He does take this very sparingly but I did refill this in December. Prior to that he has not had any hydrocodone from [place] that it is somewhat of an issue that he is asking for now. We will do this one time for just a  few days course. If he is not getting much better at that point we will look at epidural injection versus repeating lumbar spine MRI. He may be someone that we do need to refer back to a spine surgeon.    Meds & Orders:  Meds ordered this encounter  Medications  . predniSONE (DELTASONE) 50 MG tablet    Sig: Take 1 tablet daily with food for 5 days until finished    Dispense:  5 tablet    Refill:  0  . HYDROcodone-acetaminophen (NORCO/VICODIN) 5-325 MG tablet    Sig: Take 1 tablet by mouth every 8 (eight) hours as needed for moderate pain.    Dispense:  30 tablet    Refill:  0   No orders of the defined types were placed in this encounter.   Follow-up: No Follow-up on file.   Procedures: No procedures performed  No notes on file   Clinical History: Lumbar spine MRI dated 06/20/2016  L1-2: Moderate facet hypertrophy has progressed. Mild left subarticular and moderate left foraminal narrowing is stable. Mild right foraminal narrowing is present.  L2-3: A broad-based disc protrusion is asymmetric to the left. Moderate facet hypertrophy has progressed, left greater than right. Moderate left subarticular and foraminal narrowing is present. Mild right subarticular and foraminal narrowing is noted.  L3-4: A broad-based disc protrusion is present. The patient is status post laminectomy. Advanced facet hypertrophy has progressed. Mild subarticular narrowing is worse on the right. Mild foraminal narrowing is present bilaterally.  L4-5: A wide laminectomy is noted. The central canal is decompressed. Residual recurrent rightward disc protrusion has progressed. The posterior canal is open. Moderate right subarticular stenosis is present. Severe right and moderate left foraminal narrowing is noted. This has progressed.  L5-S1: A wide laminectomy is present. Progressive facet degenerative changes are noted. Moderate left and mild right foraminal stenosis has progressed.  He  reports that he quit smoking about 42 years ago. His smoking use included Cigarettes. He has never used smokeless tobacco. No results for input(s): HGBA1C, LABURIC in the last 8760 hours.  Objective:  VS:  HT:    WT:   BMI:     BP:(!) 161/89  HR:83bpm  TEMP: ( )  RESP:  Physical Exam  Constitutional: He is oriented to person, place, and time. He appears well-developed and well-nourished. No distress.  Morbidly obese  HENT:  Head: Normocephalic and atraumatic.  Eyes: Conjunctivae are normal. Pupils are equal, round, and reactive to light.  Neck: Normal range of motion. Neck supple.  Cardiovascular: Regular rhythm and intact distal pulses.   Pulmonary/Chest: Effort normal. No respiratory distress.  Musculoskeletal:  The patient ambulates with a walker. He has difficulty arising from a seated position more so mechanically from his knees but also due to pain. He does have some pain with extension rotation to the right more than left. He has good distal strength. He has a negative slump test bilaterally.  Neurological: He is alert and oriented to person, place, and time. He exhibits normal muscle tone. Coordination normal.  Skin: Skin is warm and dry. No rash noted. No erythema.  Psychiatric: He has a normal mood and affect.  Nursing note and vitals reviewed.   Ortho Exam Imaging: No results found.  Past Medical/Family/Surgical/Social History: Medications & Allergies reviewed per EMR Patient Active Problem List   Diagnosis Date Noted  . Lumbar radiculopathy 09/30/2016  . Morbid (severe) obesity due to excess calories (HCC) 09/30/2016  . Biliary calculus with acute cholecystitis 03/21/2016  . Acute pyelonephritis 12/28/2013  . Ureteral stone with hydronephrosis 12/28/2013  . Acute kidney failure (HCC) 12/27/2013  . Severe sepsis with acute organ dysfunction (HCC) 12/27/2013  . Hypotension 12/27/2013  . Lactic acidosis 12/27/2013  . Sepsis secondary to UTI (HCC) 12/27/2013  .  EMPYEMA CHEST 08/30/2008  . PRURIGO 08/30/2008  . OBSTRUCTIVE SLEEP APNEA 08/30/2008   Past Medical History:  Diagnosis Date  . Anemia   . Arthritis   . Chronic kidney disease    HX acute kidney failure / acute pyelonephritis / hydronephrosis / severe sepsis per discharge summary 12/27/13  . History of kidney stones   . Hypertension   . Morbid obesity (HCC)   . Obstructive sleep apnea    does not need c pap since 110 lb wt loss  . Osteoporosis   . Prurigo 2002  . Scars    ON ARMS FROM CHEMICAL EXPLOSION 1999  . Spinal stenosis    History reviewed. No pertinent family history. Past Surgical History:  Procedure Laterality Date  . BACK SURGERY  01/23/2010   lumbar  . CHOLECYSTECTOMY N/A 03/24/2016   Procedure: LAPAROSCOPIC CHOLECYSTECTOMY WITH INTRAOPERATIVE CHOLANGIOGRAM;  Surgeon: Chevis PrettyPaul Toth III, MD;  Location: [facility];  Service: General;  Laterality: N/A;  . CIRCUMCISION    . CYSTOSCOPY WITH RETROGRADE PYELOGRAM, URETEROSCOPY AND STENT PLACEMENT Left 01/20/2014   Procedure: CYSTOSCOPY WITH RETROGRADE PYELOGRAM, URETEROSCOPY AND STENT EXCHANGE;  Surgeon: Milford Cageaniel Young Woodruff, MD;  Location: [facility];  Service: Urology;  Laterality: Left;  bugbee bladder fulguration  . CYSTOSCOPY WITH STENT PLACEMENT Left 12/28/2013   Procedure: CYSTOSCOPY WITH STENT PLACEMENT left retrograde pyleogram;  Surgeon: Milford Cageaniel Young Woodruff, MD;  Location: [facility];  Service: Urology;  Laterality: Left;  . ESOPHAGOGASTRODUODENOSCOPY N/A 12/07/2012   Procedure: ESOPHAGOGASTRODUODENOSCOPY (EGD);  Surgeon: Lodema PilotBrian Layton, DO;  Location: [facility];  Service: General;  Laterality: N/A;  . HOLMIUM LASER APPLICATION Left 01/20/2014   Procedure: HOLMIUM LASER APPLICATION;  Surgeon: Milford Cageaniel Young Woodruff, MD;  Location: [facility];  Service: Urology;  Laterality: Left;  . LAPAROSCOPIC GASTRIC SLEEVE RESECTION N/A 12/07/2012   Procedure: LAPAROSCOPIC GASTRIC SLEEVE RESECTION;  Surgeon: Lodema PilotBrian Layton, DO;  Location: [facility];  Service:  General;  Laterality: N/A;  laparoscopic sleeve gastrectomy with EGD   Social History   Occupational History  . Not on file.   Social History Main Topics  . Smoking status: Former Smoker    Types: Cigarettes    Quit date: 04/09/1974  . Smokeless tobacco: Never Used  . Alcohol use No  . Drug use: No  . Sexual activity: Not on file

## 2016-12-09 ENCOUNTER — Encounter (INDEPENDENT_AMBULATORY_CARE_PROVIDER_SITE_OTHER): Payer: Self-pay | Admitting: Physical Medicine and Rehabilitation

## 2016-12-20 ENCOUNTER — Telehealth (INDEPENDENT_AMBULATORY_CARE_PROVIDER_SITE_OTHER): Payer: Self-pay

## 2016-12-20 NOTE — Telephone Encounter (Signed)
Ok for L5 transforaminal esi to side that is problematic

## 2016-12-20 NOTE — Telephone Encounter (Signed)
Pt left voice mail requesting another injection. States he did not want to keep taking medicine and feels that it is time for an injection instead.

## 2016-12-24 NOTE — Telephone Encounter (Signed)
Pt scheduled for 01/01/17 @ 2:30 w/driver and no BTS

## 2017-01-01 ENCOUNTER — Ambulatory Visit (INDEPENDENT_AMBULATORY_CARE_PROVIDER_SITE_OTHER): Payer: Self-pay

## 2017-01-01 ENCOUNTER — Ambulatory Visit (INDEPENDENT_AMBULATORY_CARE_PROVIDER_SITE_OTHER): Payer: No Typology Code available for payment source | Admitting: Physical Medicine and Rehabilitation

## 2017-01-01 VITALS — BP 152/88 | HR 81

## 2017-01-01 DIAGNOSIS — M5416 Radiculopathy, lumbar region: Secondary | ICD-10-CM | POA: Diagnosis not present

## 2017-01-01 MED ORDER — LIDOCAINE HCL (PF) 1 % IJ SOLN
0.3300 mL | Freq: Once | INTRAMUSCULAR | Status: AC
  Administered 2017-01-01: 0.3 mL

## 2017-01-01 MED ORDER — METHYLPREDNISOLONE ACETATE 80 MG/ML IJ SUSP
80.0000 mg | Freq: Once | INTRAMUSCULAR | Status: AC
  Administered 2017-01-01: 80 mg

## 2017-01-01 MED ORDER — MELOXICAM 15 MG PO TABS
15.0000 mg | ORAL_TABLET | Freq: Every day | ORAL | 2 refills | Status: DC
Start: 2017-01-01 — End: 2017-06-05

## 2017-01-01 NOTE — Patient Instructions (Signed)

## 2017-01-01 NOTE — Progress Notes (Signed)
[name] [name] - 61 y.o. male MRN [id number]  Date of birth: 1955-10-23  Office Visit Note: Visit Date: 01/01/2017 PCP: Katy Apo, MD Referred by: Renford Dills, MD  Subjective: Chief Complaint  Patient presents with  . Lower Back - Pain   HPI: [name] [name] is a pleasant 61 year old gentleman complaining of increasing worsening severe side low back pain. Radiates down leg to foot at times. Pain comes and goes. He has a history with [place] with having prior lumbar laminectomies with residual foraminal narrowing and lateral recess narrowing as well as facet arthropathy. He has had issues in the past with morbid obesity and deconditioning. Unfortunately seems to be gaining weight a candidate becoming less condition. Also counseled him recently on the use of Vicodin which he requested last time. He reports that he did take the prednisone that was prescribed at the last visit and it helped some. Was also  taking 1 hydrocodone a day and felt like this was too much and that is why he called to make another appointment.  He states that his wife had given him some Meloxicam of hers around a week ago and he says he took that for around 5 days and had great relief. Has not had to take anything for pain the last 2 days. He does not carry a diagnosis of diabetes or hypertension. He has had no stroke or headache issues.    ROS Otherwise per HPI.  Assessment & Plan: Visit Diagnoses:  1. Lumbar radiculopathy     Plan: Findings:  61 L5 transforaminal epidural steroid injection. I did write a prescription for meloxicam 50 mg to be taken once a day or he can take half a tablet twice a day. He is going to do this for the next few weeks of her SCI he does overall with that medication and the injection. He'll come back to see [place] of his pain returns. I do not think he would be a very good candidate for prolonged opioid management although if that is something that appeals to him that we would try to make the  appropriate referral. I feel like we need to regroup with physical therapy at some point as well. He is doing exercises in the pool.    Meds & Orders:  Meds ordered this encounter  Medications  . lidocaine (PF) (XYLOCAINE) 1 % injection 0.3 mL  . methylPREDNISolone acetate (DEPO-MEDROL) injection 80 mg  . meloxicam (MOBIC) 15 MG tablet    Sig: Take 1 tablet (15 mg total) by mouth daily. May tak 1/2 tab by mouth twice per day    Dispense:  30 tablet    Refill:  2    Orders Placed This Encounter  Procedures  . XR C-ARM NO REPORT  . Epidural Steroid injection    Follow-up: Return if symptoms worsen or fail to improve, 2 weeks.   Procedures: No procedures performed  Lumbosacral Transforaminal Epidural Steroid Injection - Infraneural Approach with Fluoroscopic Guidance  Patient: [name] [name]      Date of Birth: 16-Nov-1955 MRN: [id number] PCP: Katy Apo, MD      Visit Date: 01/01/2017   Universal Protocol:    Date/Time: 03/23/181:28 PM  Consent Given By: the patient  Position: PRONE   Additional Comments: Vital signs were monitored before and after the procedure. Patient was prepped and draped in the usual sterile fashion. The correct patient, procedure, and site was verified.   Injection Procedure Details:  Procedure Site One Meds Administered:  Meds ordered  this encounter  Medications  . lidocaine (PF) (XYLOCAINE) 1 % injection 0.3 mL  . methylPREDNISolone acetate (DEPO-MEDROL) injection 80 mg  . meloxicam (MOBIC) 15 MG tablet    Sig: Take 1 tablet (15 mg total) by mouth daily. May tak 1/2 tab by mouth twice per day    Dispense:  30 tablet    Refill:  2      Laterality: Right  Location/Site:  L5-S1  Needle size: 22 G  Needle type: Spinal  Needle Placement: Transforaminal  Findings:  -Contrast Used: 1 mL iohexol 180 mg iodine/mL   -Comments: Excellent flow of contrast along the nerve and into the epidural space.  Procedure Details: After squaring  off the end-plates of the desired vertebral level to get a true AP view, the C-arm was obliqued to the painful side so that the superior articulating process is positioned about 1/3 the length of the inferior endplate.  The needle was aimed toward the junction of the superior articular process and the transverse process of the inferior vertebrae. The needle's initial entry is in the lower third of the foramen through Kambin's triangle. The soft tissues overlying this target were infiltrated with 2-3 ml. of 1% Lidocaine without Epinephrine.  The spinal needle was then inserted and advanced toward the target using a "trajectory" view along the fluoroscope beam.  Under AP and lateral visualization, the needle was advanced so it did not puncture dura and did not traverse medially beyond the 6 o'clock position of the pedicle. Bi-planar projections were used to confirm position. Aspiration was confirmed to be negative for CSF and/or blood. A 1-2 ml. volume of Isovue-250 was injected and flow of contrast was noted at each level. Radiographs were obtained for documentation purposes.   After attaining the desired flow of contrast documented above, a 0.5 to 1.0 ml test dose of 0.25% Marcaine was injected into each respective transforaminal space.  The patient was observed for 90 seconds post injection.  After no sensory deficits were reported, and normal lower extremity motor function was noted,   the above injectate was administered so that equal amounts of the injectate were placed at each foramen (level) into the transforaminal epidural space.   Additional Comments:  The patient tolerated the procedure well Dressing: Band-Aid    Post-procedure details: Patient was observed during the procedure. Post-procedure instructions were reviewed.  Patient left the clinic in stable condition.   Clinical History: Lumbar spine MRI dated 06/20/2016  L1-2: Moderate facet hypertrophy has progressed. Mild  left subarticular and moderate left foraminal narrowing is stable. Mild right foraminal narrowing is present.  L2-3: A broad-based disc protrusion is asymmetric to the left. Moderate facet hypertrophy has progressed, left greater than right. Moderate left subarticular and foraminal narrowing is present. Mild right subarticular and foraminal narrowing is noted.  L3-4: A broad-based disc protrusion is present. The patient is status post laminectomy. Advanced facet hypertrophy has progressed. Mild subarticular narrowing is worse on the right. Mild foraminal narrowing is present bilaterally.  L4-5: A wide laminectomy is noted. The central canal is decompressed. Residual recurrent rightward disc protrusion has progressed. The posterior canal is open. Moderate right subarticular stenosis is present. Severe right and moderate left foraminal narrowing is noted. This has progressed.  L5-S1: A wide laminectomy is present. Progressive facet degenerative changes are noted. Moderate left and mild right foraminal stenosis has progressed.  He reports that he quit smoking about 42 years ago. His smoking use included Cigarettes. He has never used smokeless  tobacco. No results for input(s): HGBA1C, LABURIC in the last 8760 hours.  Objective:  VS:  HT:    WT:   BMI:     BP:(!) 152/88  HR:81bpm  TEMP: ( )  RESP:99 % Physical Exam  Musculoskeletal:  Patient ambulates with difficulty using a walker. Prior to this he was using no walker for ambulation. He has had no focal weakness.    Ortho Exam Imaging: No results found.  Past Medical/Family/Surgical/Social History: Medications & Allergies reviewed per EMR Patient Active Problem List   Diagnosis Date Noted  . Lumbar radiculopathy 09/30/2016  . Morbid (severe) obesity due to excess calories (HCC) 09/30/2016  . Biliary calculus with acute cholecystitis 03/21/2016  . Acute pyelonephritis 12/28/2013  . Ureteral stone with hydronephrosis  12/28/2013  . Acute kidney failure (HCC) 12/27/2013  . Severe sepsis with acute organ dysfunction (HCC) 12/27/2013  . Hypotension 12/27/2013  . Lactic acidosis 12/27/2013  . Sepsis secondary to UTI (HCC) 12/27/2013  . EMPYEMA CHEST 08/30/2008  . PRURIGO 08/30/2008  . OBSTRUCTIVE SLEEP APNEA 08/30/2008   Past Medical History:  Diagnosis Date  . Anemia   . Arthritis   . Chronic kidney disease    HX acute kidney failure / acute pyelonephritis / hydronephrosis / severe sepsis per discharge summary 12/27/13  . History of kidney stones   . Hypertension   . Morbid obesity (HCC)   . Obstructive sleep apnea    does not need c pap since 110 lb wt loss  . Osteoporosis   . Prurigo 2002  . Scars    ON ARMS FROM CHEMICAL EXPLOSION 1999  . Spinal stenosis    No family history on file. Past Surgical History:  Procedure Laterality Date  . BACK SURGERY  01/23/2010   lumbar  . CHOLECYSTECTOMY N/A 03/24/2016   Procedure: LAPAROSCOPIC CHOLECYSTECTOMY WITH INTRAOPERATIVE CHOLANGIOGRAM;  Surgeon: Chevis Pretty III, MD;  Location: [facility];  Service: General;  Laterality: N/A;  . CIRCUMCISION    . CYSTOSCOPY WITH RETROGRADE PYELOGRAM, URETEROSCOPY AND STENT PLACEMENT Left 01/20/2014   Procedure: CYSTOSCOPY WITH RETROGRADE PYELOGRAM, URETEROSCOPY AND STENT EXCHANGE;  Surgeon: Milford Cage, MD;  Location: [facility];  Service: Urology;  Laterality: Left;  bugbee bladder fulguration  . CYSTOSCOPY WITH STENT PLACEMENT Left 12/28/2013   Procedure: CYSTOSCOPY WITH STENT PLACEMENT left retrograde pyleogram;  Surgeon: Milford Cage, MD;  Location: [facility];  Service: Urology;  Laterality: Left;  . ESOPHAGOGASTRODUODENOSCOPY N/A 12/07/2012   Procedure: ESOPHAGOGASTRODUODENOSCOPY (EGD);  Surgeon: Lodema Pilot, DO;  Location: [facility];  Service: General;  Laterality: N/A;  . HOLMIUM LASER APPLICATION Left 01/20/2014   Procedure: HOLMIUM LASER APPLICATION;  Surgeon: Milford Cage, MD;  Location: [facility];   Service: Urology;  Laterality: Left;  . LAPAROSCOPIC GASTRIC SLEEVE RESECTION N/A 12/07/2012   Procedure: LAPAROSCOPIC GASTRIC SLEEVE RESECTION;  Surgeon: Lodema Pilot, DO;  Location: [facility];  Service: General;  Laterality: N/A;  laparoscopic sleeve gastrectomy with EGD   Social History   Occupational History  . Not on file.   Social History Main Topics  . Smoking status: Former Smoker    Types: Cigarettes    Quit date: 04/09/1974  . Smokeless tobacco: Never Used  . Alcohol use No  . Drug use: No  . Sexual activity: Not on file

## 2017-01-03 NOTE — Procedures (Signed)
Lumbosacral Transforaminal Epidural Steroid Injection - Infraneural Approach with Fluoroscopic Guidance  Patient: [name] [name]      Date of Birth: 02-13-1956 MRN: [id number] PCP: Katy ApoPOLITE,RONALD D, MD      Visit Date: 01/01/2017   Universal Protocol:    Date/Time: 03/23/181:28 PM  Consent Given By: the patient  Position: PRONE   Additional Comments: Vital signs were monitored before and after the procedure. Patient was prepped and draped in the usual sterile fashion. The correct patient, procedure, and site was verified.   Injection Procedure Details:  Procedure Site One Meds Administered:  Meds ordered this encounter  Medications  . lidocaine (PF) (XYLOCAINE) 1 % injection 0.3 mL  . methylPREDNISolone acetate (DEPO-MEDROL) injection 80 mg  . meloxicam (MOBIC) 15 MG tablet    Sig: Take 1 tablet (15 mg total) by mouth daily. May tak 1/2 tab by mouth twice per day    Dispense:  30 tablet    Refill:  2      Laterality: Right  Location/Site:  L5-S1  Needle size: 22 G  Needle type: Spinal  Needle Placement: Transforaminal  Findings:  -Contrast Used: 1 mL iohexol 180 mg iodine/mL   -Comments: Excellent flow of contrast along the nerve and into the epidural space.  Procedure Details: After squaring off the end-plates of the desired vertebral level to get a true AP view, the C-arm was obliqued to the painful side so that the superior articulating process is positioned about 1/3 the length of the inferior endplate.  The needle was aimed toward the junction of the superior articular process and the transverse process of the inferior vertebrae. The needle's initial entry is in the lower third of the foramen through Kambin's triangle. The soft tissues overlying this target were infiltrated with 2-3 ml. of 1% Lidocaine without Epinephrine.  The spinal needle was then inserted and advanced toward the target using a "trajectory" view along the fluoroscope beam.  Under AP and lateral  visualization, the needle was advanced so it did not puncture dura and did not traverse medially beyond the 6 o'clock position of the pedicle. Bi-planar projections were used to confirm position. Aspiration was confirmed to be negative for CSF and/or blood. A 1-2 ml. volume of Isovue-250 was injected and flow of contrast was noted at each level. Radiographs were obtained for documentation purposes.   After attaining the desired flow of contrast documented above, a 0.5 to 1.0 ml test dose of 0.25% Marcaine was injected into each respective transforaminal space.  The patient was observed for 90 seconds post injection.  After no sensory deficits were reported, and normal lower extremity motor function was noted,   the above injectate was administered so that equal amounts of the injectate were placed at each foramen (level) into the transforaminal epidural space.   Additional Comments:  The patient tolerated the procedure well Dressing: Band-Aid    Post-procedure details: Patient was observed during the procedure. Post-procedure instructions were reviewed.  Patient left the clinic in stable condition.

## 2017-01-29 ENCOUNTER — Ambulatory Visit (INDEPENDENT_AMBULATORY_CARE_PROVIDER_SITE_OTHER): Payer: No Typology Code available for payment source | Admitting: Physician Assistant

## 2017-01-29 DIAGNOSIS — M1712 Unilateral primary osteoarthritis, left knee: Secondary | ICD-10-CM | POA: Diagnosis not present

## 2017-01-29 MED ORDER — METHYLPREDNISOLONE ACETATE 40 MG/ML IJ SUSP
40.0000 mg | INTRAMUSCULAR | Status: AC | PRN
  Administered 2017-01-29: 40 mg via INTRA_ARTICULAR

## 2017-01-29 MED ORDER — LIDOCAINE HCL 1 % IJ SOLN
3.0000 mL | INTRAMUSCULAR | Status: AC | PRN
  Administered 2017-01-29: 3 mL

## 2017-01-29 NOTE — Progress Notes (Signed)
Office Visit Note   Patient: [name] [name]           Date of Birth: 05-04-1956           MRN: [id number] Visit Date: 01/29/2017              Requested by: [name] Dills, MD [address] [place], [place] [address] PCP: [name] Apo, MD   Assessment & Plan: Visit Diagnoses: No diagnosis found.  Plan: He'll continue work on Dance movement psychotherapist. We'll see if we can have him approved for a supplemental injection in his knee and call him when these are available. Questions encouraged and answered  Follow-Up Instructions: Return for Supplemental injection.   Orders:  Orders Placed This Encounter  Procedures  . Large Joint Injection/Arthrocentesis   No orders of the defined types were placed in this encounter.     Procedures: Large Joint Inj Date/Time: 01/29/2017 10:31 AM Performed by: [name] [name] Authorized by: [name] [name]   Consent Given by:  Patient Indications:  Pain Location:  Knee Site:  L knee Needle Size:  22 G Needle Length:  3.5 inches Approach:  Anterolateral Ultrasound Guidance: No   Fluoroscopic Guidance: No   Medications:  40 mg methylPREDNISolone acetate 40 MG/ML; 3 mL lidocaine 1 % Aspiration Attempted: No   Patient tolerance:  Patient tolerated the procedure well with no immediate complications     Clinical Data: No additional findings.   Subjective: Left knee pain  HPI [name] [name] is well known to [name] [name] has moderate arthritis and degenerative changes of his left knee. He last had a left knee injection in the left knee by [name] [name] on 10/15/2016. He reports he was doing well until he fell on the beach in February. He comes in today requesting a injection in the knee. He also would like to get a supplemental injection in the knee in the future. Review of Systems   Objective: Vital Signs: There were no vitals taken for this visit.  Physical Exam  Constitutional: He is oriented to person, place, and time. He  appears well-developed and well-nourished. No distress.  Neurological: He is alert and oriented to person, place, and time.  Psychiatric: He has a normal mood and affect.    Ortho Exam Left knee full extension and flexion. No tenderness along medial lateral joint line. Mild peripatellar tenderness with palpation No instability valgus varus stressing. No effusion abnormal warmth. Specialty Comments:  No specialty comments available.  Imaging: No results found.   PMFS History: Patient Active Problem List   Diagnosis Date Noted  . Lumbar radiculopathy 09/30/2016  . Morbid (severe) obesity due to excess calories (HCC) 09/30/2016  . Biliary calculus with acute cholecystitis 03/21/2016  . Acute pyelonephritis 12/28/2013  . Ureteral stone with hydronephrosis 12/28/2013  . Acute kidney failure (HCC) 12/27/2013  . Severe sepsis with acute organ dysfunction (HCC) 12/27/2013  . Hypotension 12/27/2013  . Lactic acidosis 12/27/2013  . Sepsis secondary to UTI (HCC) 12/27/2013  . EMPYEMA CHEST 08/30/2008  . PRURIGO 08/30/2008  . OBSTRUCTIVE SLEEP APNEA 08/30/2008   Past Medical History:  Diagnosis Date  . Anemia   . Arthritis   . Chronic kidney disease    HX acute kidney failure / acute pyelonephritis / hydronephrosis / severe sepsis per discharge summary 12/27/13  . History of kidney stones   . Hypertension   . Morbid obesity (HCC)   . Obstructive sleep apnea    does not need c pap  since 110 lb wt loss  . Osteoporosis   . Prurigo 2002  . Scars    ON ARMS FROM CHEMICAL EXPLOSION 1999  . Spinal stenosis     No family history on file.  Past Surgical History:  Procedure Laterality Date  . BACK SURGERY  01/23/2010   lumbar  . CHOLECYSTECTOMY N/A 03/24/2016   Procedure: LAPAROSCOPIC CHOLECYSTECTOMY WITH INTRAOPERATIVE CHOLANGIOGRAM;  Surgeon: [name] Pretty III, MD;  Location: [facility];  Service: General;  Laterality: N/A;  . CIRCUMCISION    . CYSTOSCOPY WITH RETROGRADE PYELOGRAM,  URETEROSCOPY AND STENT PLACEMENT Left 01/20/2014   Procedure: CYSTOSCOPY WITH RETROGRADE PYELOGRAM, URETEROSCOPY AND STENT EXCHANGE;  Surgeon: [name] Cage, MD;  Location: [facility];  Service: Urology;  Laterality: Left;  bugbee bladder fulguration  . CYSTOSCOPY WITH STENT PLACEMENT Left 12/28/2013   Procedure: CYSTOSCOPY WITH STENT PLACEMENT left retrograde pyleogram;  Surgeon: [name] Cage, MD;  Location: [facility];  Service: Urology;  Laterality: Left;  . ESOPHAGOGASTRODUODENOSCOPY N/A 12/07/2012   Procedure: ESOPHAGOGASTRODUODENOSCOPY (EGD);  Surgeon: [name] Pilot, DO;  Location: [facility];  Service: General;  Laterality: N/A;  . HOLMIUM LASER APPLICATION Left 01/20/2014   Procedure: HOLMIUM LASER APPLICATION;  Surgeon: [name] Cage, MD;  Location: [facility];  Service: Urology;  Laterality: Left;  . LAPAROSCOPIC GASTRIC SLEEVE RESECTION N/A 12/07/2012   Procedure: LAPAROSCOPIC GASTRIC SLEEVE RESECTION;  Surgeon: [name] Pilot, DO;  Location: [facility];  Service: General;  Laterality: N/A;  laparoscopic sleeve gastrectomy with EGD   Social History   Occupational History  . Not on file.   Social History Main Topics  . Smoking status: Former Smoker    Types: Cigarettes    Quit date: 04/09/1974  . Smokeless tobacco: Never Used  . Alcohol use No  . Drug use: No  . Sexual activity: Not on file

## 2017-03-06 ENCOUNTER — Telehealth (INDEPENDENT_AMBULATORY_CARE_PROVIDER_SITE_OTHER): Payer: Self-pay | Admitting: Orthopaedic Surgery

## 2017-03-06 NOTE — Telephone Encounter (Signed)
Patient states at his last visit he was told about getting the Synvisc or Monovisc in his left knee and he would like to go ahead with the process.

## 2017-03-25 ENCOUNTER — Ambulatory Visit (INDEPENDENT_AMBULATORY_CARE_PROVIDER_SITE_OTHER): Payer: No Typology Code available for payment source | Admitting: Physician Assistant

## 2017-03-26 ENCOUNTER — Ambulatory Visit (INDEPENDENT_AMBULATORY_CARE_PROVIDER_SITE_OTHER): Payer: No Typology Code available for payment source | Admitting: Physician Assistant

## 2017-03-26 DIAGNOSIS — M1712 Unilateral primary osteoarthritis, left knee: Secondary | ICD-10-CM

## 2017-03-26 MED ORDER — HYDROCODONE-ACETAMINOPHEN 5-325 MG PO TABS: 1.0000 | ORAL_TABLET | Freq: Three times a day (TID) | ORAL | 0 refills | Status: DC | PRN

## 2017-03-26 NOTE — Progress Notes (Signed)
Patient ID: [name] [name], male   DOB: October 29, 1955, 61 y.o.   MRN: [id number]  Mr. Brundage well-known Dr. Eliberto IvoryBlackman's service has known moderate osteoarthritis left knee. Comes in today for left knee model this injection. He's had no change in overall symptoms in the knee.  Left knee overall good range of motion. No abnormal warmth erythema or effusion. Small abrasions over the medial anterior aspect of the knee. No gross signs of infection.  Procedure left knee is prepped with Betadine and apical adhesiveness as can and then by a sample of monitoring this which was obtained for his use personally is injected in the left knee from anterior lateral approach patient tolerates well.  Plan: Work on Dance movement psychotherapistquad strengthening. See him back in 8 weeks to check the efficacy of the Mono-Visc.  He is having some low back pain and is wondering get back with Dr. Alvester MorinNewton for possible epidural steroid injections again. Did ask for some Norco did agree to give him 1 prescription for Norco due to his low back pain with radicular symptoms down the right leg.

## 2017-03-31 ENCOUNTER — Telehealth (INDEPENDENT_AMBULATORY_CARE_PROVIDER_SITE_OTHER): Payer: Self-pay | Admitting: Physical Medicine and Rehabilitation

## 2017-03-31 NOTE — Telephone Encounter (Signed)
Ok if helped but has been an issue with wanting increasing pain meds at the time,

## 2017-04-01 NOTE — Telephone Encounter (Signed)
Pain is in same area on same side, and last injection helped. Scheduled for 04/10/17 at 1530 with driver and no blood thinners.

## 2017-04-10 ENCOUNTER — Encounter (INDEPENDENT_AMBULATORY_CARE_PROVIDER_SITE_OTHER): Payer: Self-pay | Admitting: Physical Medicine and Rehabilitation

## 2017-04-10 ENCOUNTER — Ambulatory Visit (INDEPENDENT_AMBULATORY_CARE_PROVIDER_SITE_OTHER): Payer: No Typology Code available for payment source | Admitting: Physical Medicine and Rehabilitation

## 2017-04-10 ENCOUNTER — Ambulatory Visit (INDEPENDENT_AMBULATORY_CARE_PROVIDER_SITE_OTHER): Payer: Self-pay

## 2017-04-10 VITALS — BP 148/84 | HR 70

## 2017-04-10 DIAGNOSIS — M961 Postlaminectomy syndrome, not elsewhere classified: Secondary | ICD-10-CM

## 2017-04-10 DIAGNOSIS — M5416 Radiculopathy, lumbar region: Secondary | ICD-10-CM

## 2017-04-10 MED ORDER — METHYLPREDNISOLONE ACETATE 80 MG/ML IJ SUSP
80.0000 mg | Freq: Once | INTRAMUSCULAR | Status: AC
  Administered 2017-04-10: 80 mg

## 2017-04-10 MED ORDER — LIDOCAINE HCL (PF) 1 % IJ SOLN
2.0000 mL | Freq: Once | INTRAMUSCULAR | Status: AC
  Administered 2017-04-10: 2 mL

## 2017-04-10 NOTE — Patient Instructions (Signed)

## 2017-04-10 NOTE — Progress Notes (Deleted)
Increased right side low back pain for 1 to 2 weeks. Pain down leg to foot and tingling in foot into toes.

## 2017-04-11 NOTE — Procedures (Signed)
Lumbosacral Transforaminal Epidural Steroid Injection - Infraneural Approach with Fluoroscopic Guidance  Patient: [name] [name]      Date of Birth: 1956/08/19 MRN: [id number] PCP: Renford DillsPolite, Ronald, MD      Visit Date: 04/10/2017   Mr. [name] SheehanMeehan is a 61 year old gentleman with chronic worsening multiple orthopedic complaints and morbid obesity. His brief review of history is that he lost some weight and was doing better after lumbar decompression. He follows with Dr. Lewanda RifeBlackman andGil Clark, P.A.-C for his knees. We completed right L5 transforaminal epidural injection a couple months ago with good relief but short-lived results. MRI is reviewed again today and he does have a focal area impacting the L5 nerve root on the right. No decompressed areas otherwise. The last time I saw him is well-healed was asking for more hydrocodone. We did give him a short prescription. Now it. She did get a prescription from Rexene EdisonGil Clark. I've discussed with him and his wife on both occasions and is not or ligament be able to live on pain medications. He needs to really go through again in strength in his self and try to lose weight and become more active. He may be a surgical candidate for the L5 problem and we are to repeat the injection today. He may actually be a good candidate for medication such as duloxetine or Cymbalta. This may give him some pain relief of both his knees and back. We'll see a does with the injection and have a plan point forward. He has good distal strength. He ambulates with a walker.  Universal Protocol:     Consent Given By: the patient  Position: PRONE   Additional Comments: Vital signs were monitored before and after the procedure. Patient was prepped and draped in the usual sterile fashion. The correct patient, procedure, and site was verified.   Injection Procedure Details:  Procedure Site One Meds Administered:  Meds ordered this encounter  Medications  . lidocaine (PF) (XYLOCAINE) 1 %  injection 2 mL  . methylPREDNISolone acetate (DEPO-MEDROL) injection 80 mg      Laterality: Right  Location/Site:  L5-S1  Needle size: 22 G  Needle type: Spinal  Needle Placement: Transforaminal  Findings:  -Contrast Used: 1 mL iohexol 180 mg iodine/mL   -Comments: Excellent flow of contrast along the nerve and into the epidural space.  Procedure Details: After squaring off the end-plates of the desired vertebral level to get a true AP view, the C-arm was obliqued to the painful side so that the superior articulating process is positioned about 1/3 the length of the inferior endplate.  The needle was aimed toward the junction of the superior articular process and the transverse process of the inferior vertebrae. The needle's initial entry is in the lower third of the foramen through Kambin's triangle. The soft tissues overlying this target were infiltrated with 2-3 ml. of 1% Lidocaine without Epinephrine.  The spinal needle was then inserted and advanced toward the target using a "trajectory" view along the fluoroscope beam.  Under AP and lateral visualization, the needle was advanced so it did not puncture dura and did not traverse medially beyond the 6 o'clock position of the pedicle. Bi-planar projections were used to confirm position. Aspiration was confirmed to be negative for CSF and/or blood. A 1-2 ml. volume of Isovue-250 was injected and flow of contrast was noted at each level. Radiographs were obtained for documentation purposes.   After attaining the desired flow of contrast documented above, a 0.5 to 1.0 ml test  dose of 0.25% Marcaine was injected into each respective transforaminal space.  The patient was observed for 90 seconds post injection.  After no sensory deficits were reported, and normal lower extremity motor function was noted,   the above injectate was administered so that equal amounts of the injectate were placed at each foramen (level) into the transforaminal  epidural space.   Additional Comments:  The patient tolerated the procedure well Dressing: Band-Aid    Post-procedure details: Patient was observed during the procedure. Post-procedure instructions were reviewed.  Patient left the clinic in stable condition.

## 2017-04-22 ENCOUNTER — Telehealth (INDEPENDENT_AMBULATORY_CARE_PROVIDER_SITE_OTHER): Payer: Self-pay | Admitting: Physical Medicine and Rehabilitation

## 2017-04-23 MED ORDER — DULOXETINE HCL 60 MG PO CPEP: 60.0000 mg | ORAL_CAPSULE | Freq: Every day | ORAL | 3 refills | Status: DC

## 2017-04-23 NOTE — Telephone Encounter (Signed)
As per our last visit the patient did call back and we are prescribing duloxetine 60 mg once per day. I'll have the patient come back and see us in 3 weeks.

## 2017-04-23 NOTE — Telephone Encounter (Signed)
I went ahead and started Cymbalta/duloxetine 60 mg once per day. The patient may experience some dry mouth and he can tell him that. I want to see him back in 3 weeks.

## 2017-04-23 NOTE — Telephone Encounter (Signed)
Patient advised and scheduled for an OV in 3 weeks.

## 2017-05-15 ENCOUNTER — Ambulatory Visit (INDEPENDENT_AMBULATORY_CARE_PROVIDER_SITE_OTHER): Payer: No Typology Code available for payment source | Admitting: Physical Medicine and Rehabilitation

## 2017-05-19 ENCOUNTER — Telehealth (INDEPENDENT_AMBULATORY_CARE_PROVIDER_SITE_OTHER): Payer: Self-pay | Admitting: Radiology

## 2017-05-19 NOTE — Telephone Encounter (Signed)
Patient would like to reschedule appt he missed last week.

## 2017-05-19 NOTE — Telephone Encounter (Signed)
Pt scheduled for 05/27/17 @ 9:30

## 2017-05-26 ENCOUNTER — Ambulatory Visit (INDEPENDENT_AMBULATORY_CARE_PROVIDER_SITE_OTHER): Payer: No Typology Code available for payment source | Admitting: Physician Assistant

## 2017-05-27 ENCOUNTER — Encounter (INDEPENDENT_AMBULATORY_CARE_PROVIDER_SITE_OTHER): Payer: Self-pay | Admitting: Physical Medicine and Rehabilitation

## 2017-05-27 ENCOUNTER — Ambulatory Visit (INDEPENDENT_AMBULATORY_CARE_PROVIDER_SITE_OTHER): Payer: No Typology Code available for payment source | Admitting: Physical Medicine and Rehabilitation

## 2017-05-27 DIAGNOSIS — M5442 Lumbago with sciatica, left side: Secondary | ICD-10-CM

## 2017-05-27 DIAGNOSIS — M5416 Radiculopathy, lumbar region: Secondary | ICD-10-CM | POA: Diagnosis not present

## 2017-05-27 DIAGNOSIS — M5441 Lumbago with sciatica, right side: Secondary | ICD-10-CM | POA: Diagnosis not present

## 2017-05-27 DIAGNOSIS — G8929 Other chronic pain: Secondary | ICD-10-CM

## 2017-05-27 DIAGNOSIS — M961 Postlaminectomy syndrome, not elsewhere classified: Secondary | ICD-10-CM | POA: Diagnosis not present

## 2017-05-27 DIAGNOSIS — G894 Chronic pain syndrome: Secondary | ICD-10-CM

## 2017-05-27 NOTE — Progress Notes (Deleted)
3 week rov--Right l5-s1 Trans injection. States that his back is not great and any better, states doing worst. Meds not doing to much. States that he is going to need more.

## 2017-05-29 ENCOUNTER — Encounter (INDEPENDENT_AMBULATORY_CARE_PROVIDER_SITE_OTHER): Payer: Self-pay | Admitting: Physical Medicine and Rehabilitation

## 2017-05-29 NOTE — Progress Notes (Signed)
[name] [name] - 61 y.o. male MRN [id number]  Date of birth: 12-Sep-1956  Office Visit Note: Visit Date: 05/27/2017 PCP: Renford Dills, MD Referred by: Renford Dills, MD  Subjective: Chief Complaint  Patient presents with  . Lower Back - Follow-up   HPI: [name] [name] is a pleasant 61 year old gentleman originally seen in the office by Dr. Lavada Mesi who has suffered many complications from really morbid obesity. We initially saw him for his lumbar spine issues which included severe stenosis. He went on to have a decompression laminectomy performed at multiple levels by Dr. Otelia Sergeant with decent relief of his spine pain for quite a while. Most recently we've completed L5 transforaminal injections for his right low back and hip pain. He comes in today after right L5 transforaminal injection a couple months ago was still no relief of his back pain. We started him on Cymbalta he's been taking that now for about 4 weeks. He said initially it made him a little bit dizzy but that has gone away. He does feel like his helped his back and knee pain in general. He is not taking any narcotics at this point but it is something that he feels is beneficial for him. He also has been seeing Dr. Magnus Ivan for his knees. His knees are still doing a problem. He has gained a lot of weight back. He's had no new falls or trauma. He is back to using the walker and wheelchair and really not doing a lot of activity. He reports this is because of the pain. At the same time he reports that the medication has helped his back and knee pain. He denies any numbness or paresthesias or focal weakness. He feels like his right hip and leg are in a give out at times. He reports his pain is severe. He also takes Mobic. He does not take Tylenol regularly.    Review of Systems  Constitutional: Negative for chills, fever, malaise/fatigue and weight loss.  HENT: Negative for hearing loss and sinus pain.   Eyes: Negative for blurred vision,  double vision and photophobia.  Respiratory: Negative for cough and shortness of breath.   Cardiovascular: Negative for chest pain, palpitations and leg swelling.  Gastrointestinal: Negative for abdominal pain, nausea and vomiting.  Genitourinary: Negative for flank pain.  Musculoskeletal: Positive for back pain and joint pain. Negative for myalgias.  Skin: Negative for itching and rash.  Neurological: Negative for tremors, focal weakness and weakness.  Endo/Heme/Allergies: Negative.   Psychiatric/Behavioral: Negative for depression.  All other systems reviewed and are negative.  Otherwise per HPI.  Assessment & Plan: Visit Diagnoses:  1. Lumbar radiculopathy   2. Post laminectomy syndrome   3. Chronic bilateral low back pain with bilateral sciatica   4. Morbid (severe) obesity due to excess calories (HCC)   5. Chronic pain syndrome     Plan: Findings:  Chronic worsening mostly right low back and hip pain but generalized pain with the low back and knees. His case is very common. By morbid obesity. Done well for a while and had lost some weight and became mobile but now this is really gone back the other direction. I'll ask about physical therapy which he says he went through at least a few months ago since been fairly recent. He reports feeling weakness in the legs just try to get going. Today on exam he is not exhibiting any neurogenic weakness. MRI from 2017 does not show any severe compression centrally of the lumbar spine.  There is progressive disc herniation rightward at L4-5 which I think is causing his symptoms. I will have him see Dr. Otelia SergeantNitka for evaluation. Within a continue with the Cymbalta. He is getting a continue with the meloxicam which is followed by his primary doctor. I will also ask him to try to take Tylenol 2-3 times per day scheduled to see if that would add to this. In terms of chronic opioid medication I don't feel like to be a great candidate all the referral to pain  management may be something to look at. We talked at length today more than 15 minutes just on activity level and how to get strength of the legs. Even though I explained these to him his comment is that his brother-in-law is a physical therapist who has been helping him but it doesn't sound like [name] [name] is actually been really trying to get stronger. I've encouraged him to do this as I think is paramount for his health.    Meds & Orders: No orders of the defined types were placed in this encounter.  No orders of the defined types were placed in this encounter.   Follow-up: Return if symptoms worsen or fail to improve.   Procedures: No procedures performed  No notes on file   Clinical History: Lumbar spine MRI dated 06/20/2016  L1-2: Moderate facet hypertrophy has progressed. Mild left subarticular and moderate left foraminal narrowing is stable. Mild right foraminal narrowing is present.  L2-3: A broad-based disc protrusion is asymmetric to the left. Moderate facet hypertrophy has progressed, left greater than right. Moderate left subarticular and foraminal narrowing is present. Mild right subarticular and foraminal narrowing is noted.  L3-4: A broad-based disc protrusion is present. The patient is status post laminectomy. Advanced facet hypertrophy has progressed. Mild subarticular narrowing is worse on the right. Mild foraminal narrowing is present bilaterally.  L4-5: A wide laminectomy is noted. The central canal is decompressed. Residual recurrent rightward disc protrusion has progressed. The posterior canal is open. Moderate right subarticular stenosis is present. Severe right and moderate left foraminal narrowing is noted. This has progressed.  L5-S1: A wide laminectomy is present. Progressive facet degenerative changes are noted. Moderate left and mild right foraminal stenosis has progressed.  He reports that he quit smoking about 43 years ago. His smoking use  included Cigarettes. He has never used smokeless tobacco. No results for input(s): HGBA1C, LABURIC in the last 8760 hours.  Objective:  VS:  HT:    WT:   BMI:     BP:   HR: bpm  TEMP: ( )  RESP:  Physical Exam  Constitutional: He is oriented to person, place, and time. He appears well-developed and well-nourished. No distress.  Morbidly obese  HENT:  Head: Normocephalic and atraumatic.  Eyes: Pupils are equal, round, and reactive to light. Conjunctivae are normal.  Neck: Normal range of motion. Neck supple.  Cardiovascular: Regular rhythm and intact distal pulses.   Pulmonary/Chest: Effort normal. No respiratory distress.  Abdominal: Soft. He exhibits no distension.  Musculoskeletal:  Patient has a very difficult time standing due to body habitus. He has some pain with rotation against extension of the spine. He has some tenderness over the trochanters. He has good distal strength bilaterally.  Neurological: He is alert and oriented to person, place, and time. He exhibits normal muscle tone.  Skin: Skin is warm and dry. No erythema.  Psychiatric: He has a normal mood and affect.  Nursing note and vitals reviewed.  Ortho Exam Imaging: No results found.  Past Medical/Family/Surgical/Social History: Medications & Allergies reviewed per EMR Patient Active Problem List   Diagnosis Date Noted  . Lumbar radiculopathy 09/30/2016  . Morbid (severe) obesity due to excess calories (HCC) 09/30/2016  . Biliary calculus with acute cholecystitis 03/21/2016  . Acute pyelonephritis 12/28/2013  . Ureteral stone with hydronephrosis 12/28/2013  . Acute kidney failure (HCC) 12/27/2013  . Severe sepsis with acute organ dysfunction (HCC) 12/27/2013  . Hypotension 12/27/2013  . Lactic acidosis 12/27/2013  . Sepsis secondary to UTI (HCC) 12/27/2013  . EMPYEMA CHEST 08/30/2008  . PRURIGO 08/30/2008  . OBSTRUCTIVE SLEEP APNEA 08/30/2008   Past Medical History:  Diagnosis Date  . Anemia   .  Arthritis   . Chronic kidney disease    HX acute kidney failure / acute pyelonephritis / hydronephrosis / severe sepsis per discharge summary 12/27/13  . History of kidney stones   . Hypertension   . Morbid obesity (HCC)   . Obstructive sleep apnea    does not need c pap since 110 lb wt loss  . Osteoporosis   . Prurigo 2002  . Scars    ON ARMS FROM CHEMICAL EXPLOSION 1999  . Spinal stenosis    No family history on file. Past Surgical History:  Procedure Laterality Date  . BACK SURGERY  01/23/2010   lumbar  . CHOLECYSTECTOMY N/A 03/24/2016   Procedure: LAPAROSCOPIC CHOLECYSTECTOMY WITH INTRAOPERATIVE CHOLANGIOGRAM;  Surgeon: Chevis Pretty III, MD;  Location: [facility];  Service: General;  Laterality: N/A;  . CIRCUMCISION    . CYSTOSCOPY WITH RETROGRADE PYELOGRAM, URETEROSCOPY AND STENT PLACEMENT Left 01/20/2014   Procedure: CYSTOSCOPY WITH RETROGRADE PYELOGRAM, URETEROSCOPY AND STENT EXCHANGE;  Surgeon: Milford Cage, MD;  Location: [facility];  Service: Urology;  Laterality: Left;  bugbee bladder fulguration  . CYSTOSCOPY WITH STENT PLACEMENT Left 12/28/2013   Procedure: CYSTOSCOPY WITH STENT PLACEMENT left retrograde pyleogram;  Surgeon: Milford Cage, MD;  Location: [facility];  Service: Urology;  Laterality: Left;  . ESOPHAGOGASTRODUODENOSCOPY N/A 12/07/2012   Procedure: ESOPHAGOGASTRODUODENOSCOPY (EGD);  Surgeon: Lodema Pilot, DO;  Location: [facility];  Service: General;  Laterality: N/A;  . HOLMIUM LASER APPLICATION Left 01/20/2014   Procedure: HOLMIUM LASER APPLICATION;  Surgeon: Milford Cage, MD;  Location: [facility];  Service: Urology;  Laterality: Left;  . LAPAROSCOPIC GASTRIC SLEEVE RESECTION N/A 12/07/2012   Procedure: LAPAROSCOPIC GASTRIC SLEEVE RESECTION;  Surgeon: Lodema Pilot, DO;  Location: [facility];  Service: General;  Laterality: N/A;  laparoscopic sleeve gastrectomy with EGD   Social History   Occupational History  . Not on file.   Social History Main Topics  .  Smoking status: Former Smoker    Types: Cigarettes    Quit date: 04/09/1974  . Smokeless tobacco: Never Used  . Alcohol use No  . Drug use: No  . Sexual activity: Not on file

## 2017-06-04 ENCOUNTER — Ambulatory Visit (INDEPENDENT_AMBULATORY_CARE_PROVIDER_SITE_OTHER): Payer: No Typology Code available for payment source | Admitting: Physician Assistant

## 2017-06-04 ENCOUNTER — Ambulatory Visit (INDEPENDENT_AMBULATORY_CARE_PROVIDER_SITE_OTHER): Payer: No Typology Code available for payment source

## 2017-06-04 DIAGNOSIS — M25551 Pain in right hip: Secondary | ICD-10-CM

## 2017-06-04 DIAGNOSIS — M5416 Radiculopathy, lumbar region: Secondary | ICD-10-CM

## 2017-06-04 NOTE — Progress Notes (Signed)
Office Visit Note   Patient: [name] [name]           Date of Birth: 09/13/1956           MRN: [id number] Visit Date: 06/04/2017              Requested by: Renford Dills, MD [address] [place], [place] [address] PCP: Renford Dills, MD   Assessment & Plan: Visit Diagnoses:  1. Pain in right hip   2. Lumbar radiculopathy     Plan: We explained to Mr.Spratt that more likely his right hip pain and right buttocks pain is coming from his low back. Recommending continuing physical therapy. Also would have him at aquatic therapy. He'll follow up with [place] on an as-needed basis. He understands that he can have moderate this injections in the left knee no more frequent than every 6 months.   Follow-Up Instructions: Return if symptoms worsen or fail to improve.   Orders:  Orders Placed This Encounter  Procedures  . XR HIP UNILAT W OR W/O PELVIS 1V RIGHT   No orders of the defined types were placed in this encounter.     Procedures: No procedures performed   Clinical Data: No additional findings.   Subjective: No chief complaint on file.   HPI  Mr. Kazi turns today follow-up of this left knee status post multiple this injection 03/26/2017. He states the knee is doing great since the injection. His concern now is his low back pain and also his right hip and buttocks pain. He denies any injury. He does present today in a wheelchair. He is wondering if he notes pain is coming from his back or his hip. He's had no new injury. Last back injection was performed by Dr. Alvester Morin on 04/10/2017, this was a L5 transforaminal injection. He still having pain though. Dr. Alvester Morin as suggested he follow with Dr.Nitka who has performed decompression laminectomy at multiple levels on him in the past.  Review of Systems The history of present illness otherwise negative  Objective: Vital Signs: There were no vitals taken for this visit.  Physical Exam  Constitutional: He is oriented  to person, place, and time. He appears well-developed and well-nourished. No distress.  Pulmonary/Chest: Effort normal.  Neurological: He is alert and oriented to person, place, and time.  Skin: He is not diaphoretic.  Psychiatric: He has a normal mood and affect. His behavior is normal.    Ortho Exam Right hip he has good range of motion of the hip with some mild discomfort with extremes. No tenderness with palpation over the trochanteric region. Specialty Comments:  No specialty comments available.  Imaging: Xr Hip Unilat W Or W/o Pelvis 1v Right  Result Date: 06/04/2017 AP pelvis and lateral view of the right hip: No acute fractures. Bilateral hips well-maintained. No bony abnormalities.    PMFS History: Patient Active Problem List   Diagnosis Date Noted  . Lumbar radiculopathy 09/30/2016  . Morbid (severe) obesity due to excess calories (HCC) 09/30/2016  . Biliary calculus with acute cholecystitis 03/21/2016  . Acute pyelonephritis 12/28/2013  . Ureteral stone with hydronephrosis 12/28/2013  . Acute kidney failure (HCC) 12/27/2013  . Severe sepsis with acute organ dysfunction (HCC) 12/27/2013  . Hypotension 12/27/2013  . Lactic acidosis 12/27/2013  . Sepsis secondary to UTI (HCC) 12/27/2013  . EMPYEMA CHEST 08/30/2008  . PRURIGO 08/30/2008  . OBSTRUCTIVE SLEEP APNEA 08/30/2008   Past Medical History:  Diagnosis Date  . Anemia   .  Arthritis   . Chronic kidney disease    HX acute kidney failure / acute pyelonephritis / hydronephrosis / severe sepsis per discharge summary 12/27/13  . History of kidney stones   . Hypertension   . Morbid obesity (HCC)   . Obstructive sleep apnea    does not need c pap since 110 lb wt loss  . Osteoporosis   . Prurigo 2002  . Scars    ON ARMS FROM CHEMICAL EXPLOSION 1999  . Spinal stenosis     No family history on file.  Past Surgical History:  Procedure Laterality Date  . BACK SURGERY  01/23/2010   lumbar  . CHOLECYSTECTOMY N/A  03/24/2016   Procedure: LAPAROSCOPIC CHOLECYSTECTOMY WITH INTRAOPERATIVE CHOLANGIOGRAM;  Surgeon: Chevis Pretty III, MD;  Location: [facility];  Service: General;  Laterality: N/A;  . CIRCUMCISION    . CYSTOSCOPY WITH RETROGRADE PYELOGRAM, URETEROSCOPY AND STENT PLACEMENT Left 01/20/2014   Procedure: CYSTOSCOPY WITH RETROGRADE PYELOGRAM, URETEROSCOPY AND STENT EXCHANGE;  Surgeon: Milford Cage, MD;  Location: [facility];  Service: Urology;  Laterality: Left;  bugbee bladder fulguration  . CYSTOSCOPY WITH STENT PLACEMENT Left 12/28/2013   Procedure: CYSTOSCOPY WITH STENT PLACEMENT left retrograde pyleogram;  Surgeon: Milford Cage, MD;  Location: [facility];  Service: Urology;  Laterality: Left;  . ESOPHAGOGASTRODUODENOSCOPY N/A 12/07/2012   Procedure: ESOPHAGOGASTRODUODENOSCOPY (EGD);  Surgeon: Lodema Pilot, DO;  Location: [facility];  Service: General;  Laterality: N/A;  . HOLMIUM LASER APPLICATION Left 01/20/2014   Procedure: HOLMIUM LASER APPLICATION;  Surgeon: Milford Cage, MD;  Location: [facility];  Service: Urology;  Laterality: Left;  . LAPAROSCOPIC GASTRIC SLEEVE RESECTION N/A 12/07/2012   Procedure: LAPAROSCOPIC GASTRIC SLEEVE RESECTION;  Surgeon: Lodema Pilot, DO;  Location: [facility];  Service: General;  Laterality: N/A;  laparoscopic sleeve gastrectomy with EGD   Social History   Occupational History  . Not on file.   Social History Main Topics  . Smoking status: Former Smoker    Types: Cigarettes    Quit date: 04/09/1974  . Smokeless tobacco: Never Used  . Alcohol use No  . Drug use: No  . Sexual activity: Not on file

## 2017-06-05 ENCOUNTER — Telehealth (INDEPENDENT_AMBULATORY_CARE_PROVIDER_SITE_OTHER): Payer: Self-pay | Admitting: Physical Medicine and Rehabilitation

## 2017-06-05 MED ORDER — MELOXICAM 15 MG PO TABS: 15.0000 mg | ORAL_TABLET | Freq: Every day | ORAL | 2 refills | Status: DC

## 2017-06-05 NOTE — Telephone Encounter (Signed)
refilled 

## 2017-06-05 NOTE — Telephone Encounter (Signed)
One time refill for meloxicam

## 2017-07-03 ENCOUNTER — Ambulatory Visit (INDEPENDENT_AMBULATORY_CARE_PROVIDER_SITE_OTHER): Payer: No Typology Code available for payment source | Admitting: Orthopaedic Surgery

## 2017-07-07 ENCOUNTER — Encounter (HOSPITAL_COMMUNITY): Payer: Self-pay

## 2017-07-10 ENCOUNTER — Ambulatory Visit (INDEPENDENT_AMBULATORY_CARE_PROVIDER_SITE_OTHER): Payer: No Typology Code available for payment source | Admitting: Specialist

## 2017-07-10 ENCOUNTER — Ambulatory Visit (INDEPENDENT_AMBULATORY_CARE_PROVIDER_SITE_OTHER): Payer: No Typology Code available for payment source

## 2017-07-10 ENCOUNTER — Encounter (INDEPENDENT_AMBULATORY_CARE_PROVIDER_SITE_OTHER): Payer: Self-pay | Admitting: Specialist

## 2017-07-10 VITALS — BP 154/88 | HR 76

## 2017-07-10 DIAGNOSIS — Z9884 Bariatric surgery status: Secondary | ICD-10-CM

## 2017-07-10 DIAGNOSIS — M5416 Radiculopathy, lumbar region: Secondary | ICD-10-CM | POA: Diagnosis not present

## 2017-07-10 DIAGNOSIS — M4807 Spinal stenosis, lumbosacral region: Secondary | ICD-10-CM

## 2017-07-10 MED ORDER — TRAMADOL-ACETAMINOPHEN 37.5-325 MG PO TABS: 1.0000 | ORAL_TABLET | Freq: Four times a day (QID) | ORAL | 0 refills | Status: DC | PRN

## 2017-07-10 NOTE — Progress Notes (Signed)
Office Visit Note   Patient: [name] [name]           Date of Birth: 01-Aug-1956           MRN: [id number] Visit Date: 07/10/2017              Requested by: Renford Dills, MD [address] [place], [place] [address] PCP: Renford Dills, MD   Assessment & Plan: Visit Diagnoses:  1. Lumbar radiculopathy   2. Spinal stenosis of lumbosacral region   3. Hx of gastric bypass     Plan: Avoid bending, stooping and avoid lifting weights greater than 10 lbs. Avoid prolong standing and walking. Avoid frequent bending and stooping  No lifting greater than 10 lbs. May use ice or moist heat for pain. Weight loss is of benefit. Handicap license is approved. MRI of the lumbar spine ordered Follow-Up Instructions: Return in about 3 weeks (around 07/31/2017).   Orders:  Orders Placed This Encounter  Procedures  . XR Lumbar Spine 2-3 Views   No orders of the defined types were placed in this encounter.     Procedures: No procedures performed   Clinical Data: No additional findings.   Subjective: Chief Complaint  Patient presents with  . Lower Back - Pain    61 year old male with at least 6 month history of increasing right hip and right leg pain and weakness. Underwent MRI of the lumbar spine in 06/2016 and this showed a worsening HNP right L4-5 with right sided nerve compression. At first with severe right leg posterior leg pain into the right lateral toes. Worse with bending, stooping, lifting and sitting. No using a wheel chair intermittantly. Has fallen several times this year at least 4 times. And Last summer had 2 bad falls due to right leg weakness. Bowel and bladder functions normally. Unable to grocery shop or go to [employer] or [employer] without using an electric cart to mobilize. He has had numerous ESIs by Dr. Alvester Morin, he is taking mobic and ibuprofen. He was started on a capsule duloxitine by Dr. Alvester Morin. He is sleeping welling, he is limited in standing and  walking. He was able to walk at [facility] with a walker in the past and stopped walking in [place] about 2 years ago. Uses a W/C in the house with cooking. Underwent gastric sleeve surgery in 2014, lost upwards of 100 lbs and has regained 50lbs some due to loss of activity. He is on Vitamin supplements. No testing of B12 of folate or calcium or vitaminD. Has been hospitalized for kidney infection 2015 and for a cholecystitis 2014. Kidney function is normal right now.     Review of Systems  Constitutional: Positive for activity change, fatigue and unexpected weight change.  HENT: Negative.   Eyes: Negative for photophobia, pain, redness and visual disturbance.  Respiratory: Positive for apnea and shortness of breath. Negative for wheezing and stridor.   Cardiovascular: Negative for chest pain, palpitations and leg swelling.  Gastrointestinal: Negative.   Genitourinary: Positive for frequency. Negative for difficulty urinating, dysuria, enuresis, flank pain and hematuria.  Musculoskeletal: Positive for arthralgias, back pain and gait problem. Negative for joint swelling and myalgias.  Skin: Positive for rash and wound. Negative for color change and pallor.  Neurological: Positive for dizziness, weakness and numbness. Negative for tremors, seizures, syncope, facial asymmetry, speech difficulty, light-headedness and headaches.  Hematological: Negative for adenopathy. Does not bruise/bleed easily.  Psychiatric/Behavioral: Negative for agitation, behavioral problems, confusion, decreased concentration,  dysphoric mood, hallucinations, self-injury, sleep disturbance and suicidal ideas. The patient is not nervous/anxious and is not hyperactive.      Objective: Vital Signs: BP (!) 154/88 (BP Location: Left Arm, Patient Position: Sitting)   Pulse 76   Physical Exam  Constitutional: He is oriented to person, place, and time. He appears well-developed and well-nourished. No distress.  HENT:  Head:  Normocephalic and atraumatic.  Eyes: Pupils are equal, round, and reactive to light. EOM are normal. Right eye exhibits no discharge. Left eye exhibits no discharge. No scleral icterus.  Neck: Normal range of motion. Neck supple. No tracheal deviation present. No thyromegaly present.  Cardiovascular: Exam reveals no gallop and no friction rub.   No murmur heard. Pulmonary/Chest: Effort normal and breath sounds normal.  Abdominal: Soft. Bowel sounds are normal. He exhibits no distension and no mass. There is no tenderness. There is no rebound and no guarding.  Musculoskeletal: He exhibits no tenderness or deformity.  Neurological: He is alert and oriented to person, place, and time. No cranial nerve deficit.  Skin: Skin is warm and dry. He is not diaphoretic.  Psychiatric: He has a normal mood and affect. His behavior is normal. Judgment and thought content normal.    Back Exam   Tenderness  The patient is experiencing tenderness in the lumbar.  Range of Motion  Extension: abnormal  Flexion: abnormal  Lateral Bend Right: abnormal  Lateral Bend Left: abnormal  Rotation Right: abnormal  Rotation Left: abnormal   Muscle Strength  Right Quadriceps:  5/5  Left Quadriceps:  5/5  Right Hamstrings:  5/5  Left Hamstrings:  5/5   Tests  Straight leg raise right: negative Straight leg raise left: negative  Reflexes  Patellar: 0/4 Achilles: 0/4 Biceps: 0/4 Babinski's sign: normal   Other  Toe Walk: abnormal Heel Walk: abnormal Sensation: decreased Gait: drop-foot  Erythema: no back redness Scars: present  Comments:  Left leg foot DF 3/5 ;right foot DF 4+/5 SLR negative. Knee extension normal .       Specialty Comments:  No specialty comments available.  Imaging: Xr Lumbar Spine 2-3 Views  Result Date: 07/10/2017 AP and lateral flexion and extension radiographs with moderately severe degenerative disc narrowing L5-S1 and Minimal at L4-5, severe spondylosis of the  facets L3-4, L4-5 and L5-S1. There is diffuse spondylosis changes of the lower dorsal spine with DDD and anterior osteophyes across the lower 4 thoracic levels and the T-L segment. Mild spondylosis and degenerative disc changes. L1-2 and L2-3. Central laminectomy L3-S1.    PMFS History: Patient Active Problem List   Diagnosis Date Noted  . Lumbar radiculopathy 09/30/2016  . Morbid (severe) obesity due to excess calories (HCC) 09/30/2016  . Biliary calculus with acute cholecystitis 03/21/2016  . Acute pyelonephritis 12/28/2013  . Ureteral stone with hydronephrosis 12/28/2013  . Acute kidney failure (HCC) 12/27/2013  . Severe sepsis with acute organ dysfunction (HCC) 12/27/2013  . Hypotension 12/27/2013  . Lactic acidosis 12/27/2013  . Sepsis secondary to UTI (HCC) 12/27/2013  . EMPYEMA CHEST 08/30/2008  . PRURIGO 08/30/2008  . OBSTRUCTIVE SLEEP APNEA 08/30/2008   Past Medical History:  Diagnosis Date  . Anemia   . Arthritis   . Chronic kidney disease    HX acute kidney failure / acute pyelonephritis / hydronephrosis / severe sepsis per discharge summary 12/27/13  . History of kidney stones   . Hypertension   . Morbid obesity (HCC)   . Obstructive sleep apnea    does not need  c pap since 110 lb wt loss  . Osteoporosis   . Prurigo 2002  . Scars    ON ARMS FROM CHEMICAL EXPLOSION 1999  . Spinal stenosis     No family history on file.  Past Surgical History:  Procedure Laterality Date  . BACK SURGERY  01/23/2010   lumbar  . CHOLECYSTECTOMY N/A 03/24/2016   Procedure: LAPAROSCOPIC CHOLECYSTECTOMY WITH INTRAOPERATIVE CHOLANGIOGRAM;  Surgeon: Chevis Pretty III, MD;  Location: [facility];  Service: General;  Laterality: N/A;  . CIRCUMCISION    . CYSTOSCOPY WITH RETROGRADE PYELOGRAM, URETEROSCOPY AND STENT PLACEMENT Left 01/20/2014   Procedure: CYSTOSCOPY WITH RETROGRADE PYELOGRAM, URETEROSCOPY AND STENT EXCHANGE;  Surgeon: Milford Cage, MD;  Location: [facility];  Service: Urology;   Laterality: Left;  bugbee bladder fulguration  . CYSTOSCOPY WITH STENT PLACEMENT Left 12/28/2013   Procedure: CYSTOSCOPY WITH STENT PLACEMENT left retrograde pyleogram;  Surgeon: Milford Cage, MD;  Location: [facility];  Service: Urology;  Laterality: Left;  . ESOPHAGOGASTRODUODENOSCOPY N/A 12/07/2012   Procedure: ESOPHAGOGASTRODUODENOSCOPY (EGD);  Surgeon: Lodema Pilot, DO;  Location: [facility];  Service: General;  Laterality: N/A;  . HOLMIUM LASER APPLICATION Left 01/20/2014   Procedure: HOLMIUM LASER APPLICATION;  Surgeon: Milford Cage, MD;  Location: [facility];  Service: Urology;  Laterality: Left;  . LAPAROSCOPIC GASTRIC SLEEVE RESECTION N/A 12/07/2012   Procedure: LAPAROSCOPIC GASTRIC SLEEVE RESECTION;  Surgeon: Lodema Pilot, DO;  Location: [facility];  Service: General;  Laterality: N/A;  laparoscopic sleeve gastrectomy with EGD   Social History   Occupational History  . Not on file.   Social History Main Topics  . Smoking status: Former Smoker    Types: Cigarettes    Quit date: 04/09/1974  . Smokeless tobacco: Never Used  . Alcohol use No  . Drug use: No  . Sexual activity: Not on file

## 2017-07-10 NOTE — Patient Instructions (Addendum)
Avoid bending, stooping and avoid lifting weights greater than 10 lbs. °Avoid prolong standing and walking. °Avoid frequent bending and stooping  °No lifting greater than 10 lbs. °May use ice or moist heat for pain. °Weight loss is of benefit. °Handicap license is approved. °MRI of the lumbar spine ordered °

## 2017-07-14 LAB — VITAMIN B12: VITAMIN B 12: 269 pg/mL (ref 200–1100)

## 2017-07-14 LAB — VITAMIN D 1,25 DIHYDROXY
VITAMIN D3 1, 25 (OH): 59 pg/mL
Vitamin D 1, 25 (OH)2 Total: 59 pg/mL (ref 18–72)

## 2017-07-14 LAB — HEMOGLOBIN A1C
Hgb A1c MFr Bld: 5.1 % of total Hgb (ref <5.7)
Mean Plasma Glucose: 100 (calc)
eAG (mmol/L): 5.5 (calc)

## 2017-07-14 LAB — SEDIMENTATION RATE: Sed Rate: 9 mm/h (ref 0–20)

## 2017-07-14 LAB — VITAMIN D 25 HYDROXY (VIT D DEFICIENCY, FRACTURES): VIT D 25 HYDROXY: 13 ng/mL — AB (ref 30–100)

## 2017-07-24 ENCOUNTER — Ambulatory Visit (INDEPENDENT_AMBULATORY_CARE_PROVIDER_SITE_OTHER): Payer: No Typology Code available for payment source | Admitting: Surgery

## 2017-07-29 ENCOUNTER — Ambulatory Visit
Admission: RE | Admit: 2017-07-29 | Discharge: 2017-07-29 | Disposition: A | Discharge status: Home / Self Care | Payer: No Typology Code available for payment source | Source: Ambulatory Visit | Attending: Specialist | Admitting: Specialist

## 2017-07-29 DIAGNOSIS — M4807 Spinal stenosis, lumbosacral region: Secondary | ICD-10-CM

## 2017-07-29 MED ORDER — GADOBENATE DIMEGLUMINE 529 MG/ML IV SOLN
20.0000 mL | Freq: Once | INTRAVENOUS | Status: AC | PRN
  Administered 2017-07-29: 20 mL via INTRAVENOUS

## 2017-08-07 ENCOUNTER — Telehealth (INDEPENDENT_AMBULATORY_CARE_PROVIDER_SITE_OTHER): Payer: Self-pay | Admitting: *Deleted

## 2017-08-07 NOTE — Telephone Encounter (Signed)
Danelle Earthlyoel from [facility] called stating pt insurance denied his case for cpt code [id number], pt has already had the imaging done with the authorization, pt was authorized for [phone] [id number] before imaging got changed. Danelle Earthlyoel is needing a P2P to get approval for billing purposes. I sent a inbasket message to Carlena SaxChristy W for [employer] to do a P2P.

## 2017-08-13 ENCOUNTER — Telehealth (INDEPENDENT_AMBULATORY_CARE_PROVIDER_SITE_OTHER): Payer: Self-pay | Admitting: Specialist

## 2017-08-13 NOTE — Telephone Encounter (Signed)
Put on Cancellation

## 2017-08-13 NOTE — Telephone Encounter (Signed)
Please let pt know if an earlier appt opens up  °

## 2017-08-18 ENCOUNTER — Encounter (INDEPENDENT_AMBULATORY_CARE_PROVIDER_SITE_OTHER): Payer: Self-pay | Admitting: Specialist

## 2017-08-18 ENCOUNTER — Ambulatory Visit (INDEPENDENT_AMBULATORY_CARE_PROVIDER_SITE_OTHER): Payer: No Typology Code available for payment source | Admitting: Specialist

## 2017-08-18 VITALS — BP 140/93 | HR 76 | Ht 70.0 in | Wt 320.0 lb

## 2017-08-18 DIAGNOSIS — M4815 Ankylosing hyperostosis [Forestier], thoracolumbar region: Secondary | ICD-10-CM | POA: Diagnosis not present

## 2017-08-18 DIAGNOSIS — M5136 Other intervertebral disc degeneration, lumbar region: Secondary | ICD-10-CM | POA: Diagnosis not present

## 2017-08-18 DIAGNOSIS — M51369 Other intervertebral disc degeneration, lumbar region without mention of lumbar back pain or lower extremity pain: Secondary | ICD-10-CM

## 2017-08-18 DIAGNOSIS — M4807 Spinal stenosis, lumbosacral region: Secondary | ICD-10-CM | POA: Diagnosis not present

## 2017-08-18 DIAGNOSIS — M48062 Spinal stenosis, lumbar region with neurogenic claudication: Secondary | ICD-10-CM | POA: Diagnosis not present

## 2017-08-18 MED ORDER — TRAMADOL-ACETAMINOPHEN 37.5-325 MG PO TABS: 1.0000 | ORAL_TABLET | Freq: Four times a day (QID) | ORAL | 0 refills | Status: DC | PRN

## 2017-08-18 NOTE — Addendum Note (Signed)
Addended by: Vira BrownsNITKA, Tarhonda Hollenberg on: 08/18/2017 11:49 AM   Modules accepted: Orders

## 2017-08-18 NOTE — Progress Notes (Signed)
Office Visit Note   Patient: [name] [name]           Date of Birth: 1956/07/06           MRN: [id number] Visit Date: 08/18/2017              Requested by: Renford Dills, MD [address] [place], [place] [address] PCP: Renford Dills, MD   Assessment & Plan: Visit Diagnoses:  1. Lumbar stenosis with neurogenic claudication   2. Forestier's disease of thoracolumbar region   3. Degenerative disc disease, lumbar     Plan:Avoid bending, stooping and avoid lifting weights greater than 10 lbs. Avoid prolong standing and walking. Avoid frequent bending and stooping  No lifting greater than 10 lbs. May use ice or moist heat for pain. Weight loss is of benefit. Handicap license is approved. Lumbar myelogram and post myelogram CT scan to assess the areas of spinal narrowing unfortunately the MRI does not have good enough quality.  Follow-Up Instructions: Return in about 4 weeks (around 09/15/2017).   Orders:  No orders of the defined types were placed in this encounter.  No orders of the defined types were placed in this encounter.     Procedures: No procedures performed   Clinical Data: No additional findings.   Subjective: Chief Complaint  Patient presents with  . Lower Back - Follow-up    MRI Review L-spine    61 year old male with a history of previous lumbar laminectomies L4-5 and L5-S1 in 2011 now wth recurrent neurogenic claudication in right side greater than left and into the posterior thigh and Buttocks. Pain after walking only a short distance. No bowel or bladder difficulties. No cough or sneeze pain. Sitting relieves the pain and taking medications for pain helps.     Review of Systems  Constitutional: Negative.   HENT: Negative.   Eyes: Negative.   Respiratory: Negative.   Cardiovascular: Negative.   Gastrointestinal: Negative.   Endocrine: Negative.   Genitourinary: Negative.   Musculoskeletal: Negative.   Skin: Negative.     Allergic/Immunologic: Negative.   Neurological: Negative.   Hematological: Negative.   Psychiatric/Behavioral: Negative.      Objective: Vital Signs: BP (!) 140/93 (BP Location: Left Arm, Patient Position: Sitting)   Pulse 76   Ht 5\' 10"  (1.778 m)   Wt (!) 320 lb (145.2 kg)   BMI 45.92 kg/m   Physical Exam  Constitutional: He is oriented to person, place, and time. He appears well-developed and well-nourished.  HENT:  Head: Normocephalic and atraumatic.  Eyes: EOM are normal. Pupils are equal, round, and reactive to light.  Neck: Normal range of motion. Neck supple.  Pulmonary/Chest: Effort normal and breath sounds normal.  Abdominal: Soft. Bowel sounds are normal.  Musculoskeletal: Normal range of motion.  Neurological: He is alert and oriented to person, place, and time.  Skin: Skin is warm and dry.  Psychiatric: He has a normal mood and affect. His behavior is normal. Judgment and thought content normal.    Back Exam   Muscle Strength  Right Quadriceps:  4/5   Tests  Straight leg raise right: negative Straight leg raise left: negative  Other  Scars: present  Comments:  Left foot DF is 4/5, right 5-/5. Knee left with 4+/5 right is 5/5.      Specialty Comments:  No specialty comments available.  Imaging: No results found.   PMFS History: Patient Active Problem List   Diagnosis Date Noted  .  Lumbar stenosis with neurogenic claudication 08/18/2017  . Forestier's disease of thoracolumbar region 08/18/2017  . Degenerative disc disease, lumbar 08/18/2017  . Lumbar radiculopathy 09/30/2016  . Morbid (severe) obesity due to excess calories (HCC) 09/30/2016  . Biliary calculus with acute cholecystitis 03/21/2016  . Acute pyelonephritis 12/28/2013  . Ureteral stone with hydronephrosis 12/28/2013  . Acute kidney failure (HCC) 12/27/2013  . Severe sepsis with acute organ dysfunction (HCC) 12/27/2013  . Hypotension 12/27/2013  . Lactic acidosis 12/27/2013   . Sepsis secondary to UTI (HCC) 12/27/2013  . EMPYEMA CHEST 08/30/2008  . PRURIGO 08/30/2008  . OBSTRUCTIVE SLEEP APNEA 08/30/2008   Past Medical History:  Diagnosis Date  . Anemia   . Arthritis   . Chronic kidney disease    HX acute kidney failure / acute pyelonephritis / hydronephrosis / severe sepsis per discharge summary 12/27/13  . History of kidney stones   . Hypertension   . Morbid obesity (HCC)   . Obstructive sleep apnea    does not need c pap since 110 lb wt loss  . Osteoporosis   . Prurigo 2002  . Scars    ON ARMS FROM CHEMICAL EXPLOSION 1999  . Spinal stenosis     History reviewed. No pertinent family history.  Past Surgical History:  Procedure Laterality Date  . BACK SURGERY  01/23/2010   lumbar  . CIRCUMCISION     Social History   Occupational History  . Not on file  Tobacco Use  . Smoking status: Former Smoker    Types: Cigarettes    Last attempt to quit: 04/09/1974    Years since quitting: 43.3  . Smokeless tobacco: Never Used  Substance and Sexual Activity  . Alcohol use: No  . Drug use: No  . Sexual activity: Not on file

## 2017-08-18 NOTE — Patient Instructions (Addendum)
Avoid bending, stooping and avoid lifting weights greater than 10 lbs. Avoid prolong standing and walking. Avoid frequent bending and stooping  No lifting greater than 10 lbs. May use ice or moist heat for pain. Weight loss is of benefit. Handicap license is approved. Lumbar myelogram and post myelogram CT scan to assess the areas of spinal narrowing unfortunately the MRI does not have good enough quality.

## 2017-09-09 ENCOUNTER — Ambulatory Visit
Admission: RE | Admit: 2017-09-09 | Discharge: 2017-09-09 | Disposition: A | Discharge status: Home / Self Care | Payer: No Typology Code available for payment source | Source: Ambulatory Visit | Attending: Specialist | Admitting: Specialist

## 2017-09-09 DIAGNOSIS — M5136 Other intervertebral disc degeneration, lumbar region: Secondary | ICD-10-CM

## 2017-09-09 DIAGNOSIS — M4815 Ankylosing hyperostosis [Forestier], thoracolumbar region: Secondary | ICD-10-CM

## 2017-09-09 DIAGNOSIS — M4807 Spinal stenosis, lumbosacral region: Secondary | ICD-10-CM

## 2017-09-09 DIAGNOSIS — M48062 Spinal stenosis, lumbar region with neurogenic claudication: Secondary | ICD-10-CM

## 2017-09-09 MED ORDER — DIAZEPAM 5 MG PO TABS
10.0000 mg | ORAL_TABLET | Freq: Once | ORAL | Status: AC
  Administered 2017-09-09: 10 mg via ORAL

## 2017-09-09 MED ORDER — IOPAMIDOL (ISOVUE-M 200) INJECTION 41%
18.0000 mL | Freq: Once | INTRAMUSCULAR | Status: AC
  Administered 2017-09-09: 18 mL via INTRATHECAL

## 2017-09-09 MED ORDER — ONDANSETRON HCL 4 MG/2ML IJ SOLN
4.0000 mg | Freq: Once | INTRAMUSCULAR | Status: AC
  Administered 2017-09-09: 4 mg via INTRAMUSCULAR

## 2017-09-09 MED ORDER — MEPERIDINE HCL 100 MG/ML IJ SOLN
100.0000 mg | Freq: Once | INTRAMUSCULAR | Status: AC
  Administered 2017-09-09: 100 mg via INTRAMUSCULAR

## 2017-09-09 NOTE — Discharge Instructions (Signed)
Myelogram Discharge Instructions  1. Go home and rest quietly for the next 24 hours.  It is important to lie flat for the next 24 hours.  Get up only to go to the restroom.  You may lie in the bed or on a couch on your back, your stomach, your left side or your right side.  You may have one pillow under your head.  You may have pillows between your knees while you are on your side or under your knees while you are on your back.  2. DO NOT drive today.  Recline the seat as far back as it will go, while still wearing your seat belt, on the way home.  3. You may get up to go to the bathroom as needed.  You may sit up for 10 minutes to eat.  You may resume your normal diet and medications unless otherwise indicated.  Drink plenty of extra fluids today and tomorrow.  4. The incidence of a spinal headache with nausea and/or vomiting is about 5% (one in 20 patients).  If you develop a headache, lie flat and drink plenty of fluids until the headache goes away.  Caffeinated beverages may be helpful.  If you develop severe nausea and vomiting or a headache that does not go away with flat bed rest, call [phone].  5. You may resume normal activities after your 24 hours of bed rest is over; however, do not exert yourself strongly or do any heavy lifting tomorrow.  6. Call your physician for a follow-up appointment.    You may resume Cymbalta and Tramadol on Wednesday, September 10, 2017 after 9:30a.m.

## 2017-09-11 ENCOUNTER — Ambulatory Visit (INDEPENDENT_AMBULATORY_CARE_PROVIDER_SITE_OTHER): Payer: No Typology Code available for payment source | Admitting: Specialist

## 2017-09-14 ENCOUNTER — Other Ambulatory Visit (INDEPENDENT_AMBULATORY_CARE_PROVIDER_SITE_OTHER): Payer: Self-pay | Admitting: Physical Medicine and Rehabilitation

## 2017-09-15 NOTE — Telephone Encounter (Signed)
Please advise 

## 2017-09-17 ENCOUNTER — Ambulatory Visit (INDEPENDENT_AMBULATORY_CARE_PROVIDER_SITE_OTHER): Payer: No Typology Code available for payment source | Admitting: Specialist

## 2017-09-17 ENCOUNTER — Encounter (INDEPENDENT_AMBULATORY_CARE_PROVIDER_SITE_OTHER): Payer: Self-pay | Admitting: Specialist

## 2017-09-17 VITALS — BP 160/104 | HR 81 | Ht 70.0 in | Wt 320.0 lb

## 2017-09-17 DIAGNOSIS — M48062 Spinal stenosis, lumbar region with neurogenic claudication: Secondary | ICD-10-CM | POA: Diagnosis not present

## 2017-09-17 DIAGNOSIS — R29898 Other symptoms and signs involving the musculoskeletal system: Secondary | ICD-10-CM | POA: Diagnosis not present

## 2017-09-17 MED ORDER — TRAMADOL-ACETAMINOPHEN 37.5-325 MG PO TABS: 1.0000 | ORAL_TABLET | Freq: Four times a day (QID) | ORAL | 0 refills | Status: DC | PRN

## 2017-09-17 NOTE — Progress Notes (Signed)
Office Visit Note   Patient: [name] [name]           Date of Birth: [birth year]           MRN: [id number] Visit Date: 09/17/2017              Requested by: Renford DillsPolite, Ronald, MD [address] [place], [place] [address] PCP: Renford DillsPolite, Ronald, MD   Assessment & Plan: Visit Diagnoses:  1. Spinal stenosis of lumbar region with neurogenic claudication   2. Left leg weakness     Plan: Avoid bending, stooping and avoid lifting weights greater than 10 lbs. Avoid prolong standing and walking. Order for a new walker with wheels. Surgery scheduling secretary Tivis RingerSherri Billings, will call you in the next week to schedule for surgery.  Surgery recommended is a four level lumbar laminectomies L2-3, L3-4, L4-5 and L5-S1  this would be done with microscope and cell saver.Take hydrocodone for for pain. Risk of surgery includes risk of infection 1 in 300 patients, bleeding 1/2% chance you would need a transfusion.   Risk to the nerves is one in 10,000. A corset just for comfort post op is recommended.  Follow-Up Instructions: Return in about 4 weeks (around 10/15/2017) for post op follow up.   Orders:  No orders of the defined types were placed in this encounter.  No orders of the defined types were placed in this encounter.     Procedures: No procedures performed   Clinical Data: No additional findings.   Subjective: Chief Complaint  Patient presents with  . Lower Back - Follow-up    CT Myelogram review    61 year old male with increasing difficulties with standing and walking. Unable to stand and walk even to the front of the office from the exam room without sitting. Sitting is not painful. After  Sitting for a long time even sitting hurts with numbness into the right leg and weakness left greater than right . No bowel and bladder difficulties. He has arthritis in the left knee, wants an appointment in the left knee for osteoarthritis. He has been have steady decline in his  standing and walking tolerance. No heart, lung or kidney disease. In past he underwent left and central laminectomies L3-4. L4-5 and L5-S1. He wishes to consider intervention.    Review of Systems  Constitutional: Negative for activity change, appetite change and unexpected weight change.  HENT: Positive for congestion. Negative for dental problem, ear discharge, ear pain, facial swelling, hearing loss, mouth sores, nosebleeds, postnasal drip, rhinorrhea, sinus pressure, sinus pain, sneezing, sore throat, tinnitus, trouble swallowing and voice change.   Eyes: Negative.  Negative for photophobia, pain, discharge, redness, itching and visual disturbance.  Respiratory: Negative.  Negative for apnea, cough, choking, chest tightness and shortness of breath.   Cardiovascular: Negative.  Negative for chest pain, palpitations and leg swelling.  Gastrointestinal: Negative.  Negative for abdominal distention, abdominal pain, anal bleeding, blood in stool, constipation, diarrhea and nausea.  Endocrine: Negative.   Genitourinary: Negative.  Negative for difficulty urinating, discharge, dysuria, enuresis, flank pain, frequency, genital sores and hematuria.  Musculoskeletal: Positive for arthralgias, back pain, gait problem and joint swelling. Negative for myalgias and neck pain.  Skin: Negative.  Negative for color change, pallor, rash and wound.  Allergic/Immunologic: Negative.  Negative for environmental allergies and food allergies.  Neurological: Negative for dizziness, tremors, seizures, syncope, facial asymmetry, speech difficulty, weakness, light-headedness, numbness and headaches.  Hematological: Negative.  Negative for adenopathy. Does  not bruise/bleed easily.  Psychiatric/Behavioral: Negative.  Negative for agitation, behavioral problems, confusion, decreased concentration, dysphoric mood, hallucinations, self-injury, sleep disturbance and suicidal ideas. The patient is not nervous/anxious and is not  hyperactive.      Objective: Vital Signs: BP (!) 160/104 (BP Location: Left Arm, Patient Position: Sitting)   Pulse 81   Ht 5\' 10"  (1.778 m)   Wt (!) 320 lb (145.2 kg)   BMI 45.92 kg/m   Physical Exam  Constitutional: He is oriented to person, place, and time. He appears well-developed and well-nourished.  HENT:  Head: Normocephalic and atraumatic.  Eyes: EOM are normal. Pupils are equal, round, and reactive to light.  Neck: Normal range of motion. Neck supple.  Pulmonary/Chest: Effort normal and breath sounds normal.  Abdominal: Soft. Bowel sounds are normal.  Neurological: He is alert and oriented to person, place, and time.  Skin: Skin is warm and dry.  Psychiatric: He has a normal mood and affect. His behavior is normal. Judgment and thought content normal.    Back Exam   Range of Motion  Extension: abnormal  Flexion: normal  Lateral bend right: abnormal  Lateral bend left: abnormal  Rotation right: abnormal  Rotation left: abnormal   Muscle Strength  Right Quadriceps:  5/5  Left Quadriceps:  4/5  Right Hamstrings:  5/5  Left Hamstrings:  5/5   Tests  Straight leg raise right: negative Straight leg raise left: negative  Reflexes  Patellar: normal Achilles: normal Biceps: normal Babinski's sign: normal   Other  Toe walk: normal Heel walk: normal Sensation: normal Gait: normal   Comments:  :eft Foot DF 3/5, right foot is 5-/5 left knee strength is 4+/5 right is 5/5.       Specialty Comments:  No specialty comments available.  Imaging: No results found.   PMFS History: Patient Active Problem List   Diagnosis Date Noted  . Lumbar stenosis with neurogenic claudication 08/18/2017  . Forestier's disease of thoracolumbar region 08/18/2017  . Degenerative disc disease, lumbar 08/18/2017  . Lumbar radiculopathy 09/30/2016  . Morbid (severe) obesity due to excess calories (HCC) 09/30/2016  . Biliary calculus with acute cholecystitis 03/21/2016    . Acute pyelonephritis 12/28/2013  . Ureteral stone with hydronephrosis 12/28/2013  . Acute kidney failure (HCC) 12/27/2013  . Severe sepsis with acute organ dysfunction (HCC) 12/27/2013  . Hypotension 12/27/2013  . Lactic acidosis 12/27/2013  . Sepsis secondary to UTI (HCC) 12/27/2013  . EMPYEMA CHEST 08/30/2008  . PRURIGO 08/30/2008  . OBSTRUCTIVE SLEEP APNEA 08/30/2008   Past Medical History:  Diagnosis Date  . Anemia   . Arthritis   . Chronic kidney disease    HX acute kidney failure / acute pyelonephritis / hydronephrosis / severe sepsis per discharge summary 12/27/13  . History of kidney stones   . Hypertension   . Morbid obesity (HCC)   . Obstructive sleep apnea    does not need c pap since 110 lb wt loss  . Osteoporosis   . Prurigo 2002  . Scars    ON ARMS FROM CHEMICAL EXPLOSION 1999  . Spinal stenosis     History reviewed. No pertinent family history.  Past Surgical History:  Procedure Laterality Date  . BACK SURGERY  01/23/2010   lumbar  . CHOLECYSTECTOMY N/A 03/24/2016   Procedure: LAPAROSCOPIC CHOLECYSTECTOMY WITH INTRAOPERATIVE CHOLANGIOGRAM;  Surgeon: Chevis Pretty III, MD;  Location: [facility];  Service: General;  Laterality: N/A;  . CIRCUMCISION    . CYSTOSCOPY WITH RETROGRADE PYELOGRAM,  URETEROSCOPY AND STENT PLACEMENT Left 01/20/2014   Procedure: CYSTOSCOPY WITH RETROGRADE PYELOGRAM, URETEROSCOPY AND STENT EXCHANGE;  Surgeon: Milford Cageaniel Young Woodruff, MD;  Location: [facility];  Service: Urology;  Laterality: Left;  bugbee bladder fulguration  . CYSTOSCOPY WITH STENT PLACEMENT Left 12/28/2013   Procedure: CYSTOSCOPY WITH STENT PLACEMENT left retrograde pyleogram;  Surgeon: Milford Cageaniel Young Woodruff, MD;  Location: [facility];  Service: Urology;  Laterality: Left;  . ESOPHAGOGASTRODUODENOSCOPY N/A 12/07/2012   Procedure: ESOPHAGOGASTRODUODENOSCOPY (EGD);  Surgeon: Lodema PilotBrian Layton, DO;  Location: [facility];  Service: General;  Laterality: N/A;  . HOLMIUM LASER APPLICATION Left 01/20/2014    Procedure: HOLMIUM LASER APPLICATION;  Surgeon: Milford Cageaniel Young Woodruff, MD;  Location: [facility];  Service: Urology;  Laterality: Left;  . LAPAROSCOPIC GASTRIC SLEEVE RESECTION N/A 12/07/2012   Procedure: LAPAROSCOPIC GASTRIC SLEEVE RESECTION;  Surgeon: Lodema PilotBrian Layton, DO;  Location: [facility];  Service: General;  Laterality: N/A;  laparoscopic sleeve gastrectomy with EGD   Social History   Occupational History  . Not on file  Tobacco Use  . Smoking status: Former Smoker    Types: Cigarettes    Last attempt to quit: 04/09/1974    Years since quitting: 43.4  . Smokeless tobacco: Never Used  Substance and Sexual Activity  . Alcohol use: No  . Drug use: No  . Sexual activity: Not on file

## 2017-09-17 NOTE — Patient Instructions (Signed)
Avoid bending, stooping and avoid lifting weights greater than 10 lbs. Avoid prolong standing and walking. Order for a new walker with wheels. Surgery scheduling secretary Tivis RingerSherri Billings, will call you in the next week to schedule for surgery.  Surgery recommended is a four level lumbar laminectomies L2-3, L3-4, L4-5 and L5-S1  this would be done with microscope and cell saver.Take hydrocodone for for pain. Risk of surgery includes risk of infection 1 in 300 patients, bleeding 1/2% chance you would need a transfusion.   Risk to the nerves is one in 10,000. A corset just for comfort post op is recommended.

## 2017-09-24 ENCOUNTER — Ambulatory Visit (INDEPENDENT_AMBULATORY_CARE_PROVIDER_SITE_OTHER): Payer: No Typology Code available for payment source | Admitting: Physician Assistant

## 2017-09-24 ENCOUNTER — Encounter (INDEPENDENT_AMBULATORY_CARE_PROVIDER_SITE_OTHER): Payer: Self-pay | Admitting: Physician Assistant

## 2017-09-24 DIAGNOSIS — M1712 Unilateral primary osteoarthritis, left knee: Secondary | ICD-10-CM

## 2017-09-24 MED ORDER — METHYLPREDNISOLONE ACETATE 40 MG/ML IJ SUSP
40.0000 mg | INTRAMUSCULAR | Status: AC | PRN
  Administered 2017-09-24: 40 mg via INTRA_ARTICULAR

## 2017-09-24 MED ORDER — LIDOCAINE HCL 1 % IJ SOLN
3.0000 mL | INTRAMUSCULAR | Status: AC | PRN
  Administered 2017-09-24: 3 mL

## 2017-09-24 NOTE — Progress Notes (Signed)
   Procedure Note  Patient: [name] [name]             Date of Birth: [birth year]           MRN: [id number]             Visit Date: 09/24/2017  HPI:  [name] well-known to Dr. Magnus IvanBlackman service has known osteoarthritis of his left knee.  He comes in today wanting an injection in his  left knee.  Last injection was Monovisc injection on 03/26/2017 and did well until recently.  He has had no acute injury to the knee.  He does have upcoming surgery for his lumbar spine with Dr. Otelia SergeantNitka.  Procedures: Visit Diagnoses: Primary osteoarthritis of left knee - Plan: Large Joint Inj: L knee  Large Joint Inj: L knee on 09/24/2017 4:32 PM Indications: pain Details: 22 G 1.5 in needle, anterolateral approach  Arthrogram: No  Medications: 3 mL lidocaine 1 %; 40 mg methylPREDNISolone acetate 40 MG/ML Outcome: tolerated well, no immediate complications Procedure, treatment alternatives, risks and benefits explained, specific risks discussed. Consent was given by the patient. Immediately prior to procedure a time out was called to verify the correct patient, procedure, equipment, support staff and site/side marked as required. Patient was prepped and draped in the usual sterile fashion.     Plan: See him back on an as-needed basis.  He is reminded that he should call if he wants to have the Monovisc injection so this can be ordered for him prior to coming in..Marland Kitchen

## 2017-10-09 ENCOUNTER — Ambulatory Visit (INDEPENDENT_AMBULATORY_CARE_PROVIDER_SITE_OTHER): Payer: No Typology Code available for payment source | Admitting: Specialist

## 2017-10-18 ENCOUNTER — Other Ambulatory Visit (INDEPENDENT_AMBULATORY_CARE_PROVIDER_SITE_OTHER): Payer: Self-pay | Admitting: Physical Medicine and Rehabilitation

## 2017-10-18 ENCOUNTER — Other Ambulatory Visit (INDEPENDENT_AMBULATORY_CARE_PROVIDER_SITE_OTHER): Payer: Self-pay | Admitting: Specialist

## 2017-10-20 ENCOUNTER — Ambulatory Visit (INDEPENDENT_AMBULATORY_CARE_PROVIDER_SITE_OTHER): Payer: No Typology Code available for payment source | Admitting: Specialist

## 2017-10-20 NOTE — Telephone Encounter (Signed)
ltracet reillf request

## 2017-10-20 NOTE — Telephone Encounter (Signed)
Please advise 

## 2017-10-21 NOTE — Telephone Encounter (Signed)
Called to [facility] 

## 2017-11-12 ENCOUNTER — Other Ambulatory Visit (INDEPENDENT_AMBULATORY_CARE_PROVIDER_SITE_OTHER): Payer: Self-pay | Admitting: Specialist

## 2017-11-13 ENCOUNTER — Encounter (HOSPITAL_COMMUNITY): Payer: Self-pay

## 2017-11-13 ENCOUNTER — Encounter (HOSPITAL_COMMUNITY)
Admission: RE | Admit: 2017-11-13 | Discharge: 2017-11-13 | Disposition: A | Discharge status: Home / Self Care | Payer: No Typology Code available for payment source | Source: Ambulatory Visit | Attending: Specialist | Admitting: Specialist

## 2017-11-13 DIAGNOSIS — Z01812 Encounter for preprocedural laboratory examination: Secondary | ICD-10-CM | POA: Diagnosis present

## 2017-11-13 DIAGNOSIS — Z01810 Encounter for preprocedural cardiovascular examination: Secondary | ICD-10-CM | POA: Insufficient documentation

## 2017-11-13 DIAGNOSIS — I10 Essential (primary) hypertension: Secondary | ICD-10-CM | POA: Diagnosis present

## 2017-11-13 LAB — SURGICAL PCR SCREEN
MRSA, PCR: NEGATIVE
STAPHYLOCOCCUS AUREUS: POSITIVE — AB

## 2017-11-13 LAB — COMPREHENSIVE METABOLIC PANEL
ALK PHOS: 101 U/L (ref 38–126)
ALT: 14 U/L — ABNORMAL LOW (ref 17–63)
AST: 27 U/L (ref 15–41)
Albumin: 3.7 g/dL (ref 3.5–5.0)
Anion gap: 10 (ref 5–15)
BUN: 20 mg/dL (ref 6–20)
CALCIUM: 8.7 mg/dL — AB (ref 8.9–10.3)
CO2: 25 mmol/L (ref 22–32)
CREATININE: 1.02 mg/dL (ref 0.61–1.24)
Chloride: 104 mmol/L (ref 101–111)
GFR calc non Af Amer: >60 mL/min (ref >60)
GLUCOSE: 97 mg/dL (ref 65–99)
Potassium: 3.6 mmol/L (ref 3.5–5.1)
SODIUM: 139 mmol/L (ref 135–145)
Total Bilirubin: 0.8 mg/dL (ref 0.3–1.2)
Total Protein: 7.7 g/dL (ref 6.5–8.1)

## 2017-11-13 LAB — APTT: aPTT: 28 seconds (ref 24–36)

## 2017-11-13 LAB — PROTIME-INR
INR: 1
Prothrombin Time: 13.1 seconds (ref 11.4–15.2)

## 2017-11-13 NOTE — Pre-Procedure Instructions (Signed)
    [name] [name]  11/13/2017      [facility] [phone] - [place] - [address] AT [facility] [address] [place] [id number] Phone: [phone] Fax: [phone]    Your procedure is scheduled on 11/17/17.  [facility] at 530 A.M.  Call this number if you have problems the morning of surgery:  [phone]   Remember:  Do not eat food or drink liquids after midnight.  Take these medicines the morning of surgery with A SIP OF WATER ----tylenol,tramadol   Do not wear jewelry, make-up or nail polish.  Do not wear lotions, powders, or perfumes, or deodorant.  Do not shave 48 hours prior to surgery.  Men may shave face and neck.  Do not bring valuables to the hospital.  [facility] is not responsible for any belongings or valuables.  Contacts, dentures or bridgework may not be worn into surgery.  Leave your suitcase in the car.  After surgery it may be brought to your room.  For patients admitted to the hospital, discharge time will be determined by your treatment team.  Patients discharged the day of surgery will not be allowed to drive home.   Name and phone number of your driver:   Special instructions:  Do not take any aspirin,anti-inflammatories,vitamins,or herbal supplements 5-7 days prior to surgery.  Please read over the following fact sheets that you were given. MRSA Information

## 2017-11-13 NOTE — Progress Notes (Signed)
Main lab called back and stated that the label was not on the tube for the CBC. Patient will need a CBC dos. Order placed for RN to release.

## 2017-11-14 MED ORDER — VANCOMYCIN HCL 10 G IV SOLR
1500.0000 mg | INTRAVENOUS | Status: AC
  Administered 2017-11-17: 1500 mg via INTRAVENOUS
  Filled 2017-11-14: qty 1500

## 2017-11-17 ENCOUNTER — Inpatient Hospital Stay (HOSPITAL_COMMUNITY): Payer: No Typology Code available for payment source

## 2017-11-17 ENCOUNTER — Inpatient Hospital Stay (HOSPITAL_COMMUNITY): Payer: No Typology Code available for payment source | Admitting: Anesthesiology

## 2017-11-17 ENCOUNTER — Inpatient Hospital Stay (HOSPITAL_COMMUNITY)
Admission: RE | Admit: 2017-11-17 | Discharge: 2017-11-20 | DRG: 516 | Disposition: A | Discharge status: Home Health | Payer: No Typology Code available for payment source | Source: Ambulatory Visit | Attending: Specialist | Admitting: Specialist

## 2017-11-17 ENCOUNTER — Other Ambulatory Visit: Payer: Self-pay

## 2017-11-17 ENCOUNTER — Encounter (HOSPITAL_COMMUNITY)
Admission: RE | Disposition: A | Discharge status: Home Health | Payer: Self-pay | Source: Ambulatory Visit | Attending: Specialist

## 2017-11-17 ENCOUNTER — Encounter (HOSPITAL_COMMUNITY): Payer: Self-pay | Admitting: General Practice

## 2017-11-17 DIAGNOSIS — I129 Hypertensive chronic kidney disease with stage 1 through stage 4 chronic kidney disease, or unspecified chronic kidney disease: Secondary | ICD-10-CM | POA: Diagnosis present

## 2017-11-17 DIAGNOSIS — Z79899 Other long term (current) drug therapy: Secondary | ICD-10-CM | POA: Diagnosis not present

## 2017-11-17 DIAGNOSIS — M48061 Spinal stenosis, lumbar region without neurogenic claudication: Secondary | ICD-10-CM | POA: Diagnosis present

## 2017-11-17 DIAGNOSIS — M4807 Spinal stenosis, lumbosacral region: Secondary | ICD-10-CM | POA: Diagnosis present

## 2017-11-17 DIAGNOSIS — Z6841 Body Mass Index (BMI) 40.0 and over, adult: Secondary | ICD-10-CM | POA: Diagnosis not present

## 2017-11-17 DIAGNOSIS — N189 Chronic kidney disease, unspecified: Secondary | ICD-10-CM | POA: Diagnosis present

## 2017-11-17 DIAGNOSIS — M48062 Spinal stenosis, lumbar region with neurogenic claudication: Secondary | ICD-10-CM | POA: Diagnosis not present

## 2017-11-17 DIAGNOSIS — M549 Dorsalgia, unspecified: Secondary | ICD-10-CM | POA: Diagnosis present

## 2017-11-17 DIAGNOSIS — Z888 Allergy status to other drugs, medicaments and biological substances status: Secondary | ICD-10-CM | POA: Diagnosis not present

## 2017-11-17 DIAGNOSIS — M25511 Pain in right shoulder: Secondary | ICD-10-CM | POA: Diagnosis not present

## 2017-11-17 DIAGNOSIS — M810 Age-related osteoporosis without current pathological fracture: Secondary | ICD-10-CM | POA: Diagnosis present

## 2017-11-17 DIAGNOSIS — G4733 Obstructive sleep apnea (adult) (pediatric): Secondary | ICD-10-CM | POA: Diagnosis present

## 2017-11-17 DIAGNOSIS — Z87442 Personal history of urinary calculi: Secondary | ICD-10-CM

## 2017-11-17 DIAGNOSIS — Z9889 Other specified postprocedural states: Secondary | ICD-10-CM

## 2017-11-17 DIAGNOSIS — Z87891 Personal history of nicotine dependence: Secondary | ICD-10-CM

## 2017-11-17 DIAGNOSIS — Z419 Encounter for procedure for purposes other than remedying health state, unspecified: Secondary | ICD-10-CM

## 2017-11-17 HISTORY — PX: LUMBAR LAMINECTOMY: SHX95

## 2017-11-17 LAB — CBC
HEMATOCRIT: 43.6 % (ref 39.0–52.0)
Hemoglobin: 14.4 g/dL (ref 13.0–17.0)
MCH: 28.5 pg (ref 26.0–34.0)
MCHC: 33 g/dL (ref 30.0–36.0)
MCV: 86.3 fL (ref 78.0–100.0)
Platelets: 220 10*3/uL (ref 150–400)
RBC: 5.05 MIL/uL (ref 4.22–5.81)
RDW: 15 % (ref 11.5–15.5)
WBC: 4 10*3/uL (ref 4.0–10.5)

## 2017-11-17 SURGERY — MICRODISCECTOMY LUMBAR LAMINECTOMY
Anesthesia: General | Site: Back

## 2017-11-17 MED ORDER — ADULT MULTIVITAMIN W/MINERALS CH
1.0000 | ORAL_TABLET | Freq: Every day | ORAL | Status: DC
  Administered 2017-11-18 – 2017-11-20 (×3): 1 via ORAL
  Filled 2017-11-17 (×3): qty 1

## 2017-11-17 MED ORDER — THROMBIN (RECOMBINANT) 20000 UNITS EX SOLR
CUTANEOUS | Status: DC | PRN
  Administered 2017-11-17: 10 mL via TOPICAL

## 2017-11-17 MED ORDER — HYDROCORTISONE 1 % EX CREA
1.0000 "application " | TOPICAL_CREAM | Freq: Two times a day (BID) | CUTANEOUS | Status: DC
  Administered 2017-11-17 – 2017-11-20 (×6): 1 via TOPICAL
  Filled 2017-11-17: qty 28

## 2017-11-17 MED ORDER — SUGAMMADEX SODIUM 200 MG/2ML IV SOLN
INTRAVENOUS | Status: DC | PRN
  Administered 2017-11-17: 294 mg via INTRAVENOUS

## 2017-11-17 MED ORDER — ROCURONIUM BROMIDE 100 MG/10ML IV SOLN
INTRAVENOUS | Status: DC | PRN
  Administered 2017-11-17 (×2): 10 mg via INTRAVENOUS
  Administered 2017-11-17: 40 mg via INTRAVENOUS
  Administered 2017-11-17 (×2): 20 mg via INTRAVENOUS

## 2017-11-17 MED ORDER — TRANEXAMIC ACID 1000 MG/10ML IV SOLN
1000.0000 mg | INTRAVENOUS | Status: AC
  Administered 2017-11-17: 1000 mg via INTRAVENOUS
  Filled 2017-11-17: qty 1100

## 2017-11-17 MED ORDER — ONDANSETRON HCL 4 MG/2ML IJ SOLN: 4.0000 mg | Freq: Four times a day (QID) | INTRAMUSCULAR | Status: DC | PRN

## 2017-11-17 MED ORDER — PROPOFOL 10 MG/ML IV BOLUS
INTRAVENOUS | Status: AC
  Filled 2017-11-17: qty 40

## 2017-11-17 MED ORDER — CALCIUM CITRATE-VITAMIN D 315-200 MG-UNIT PO TABS: 1.0000 | ORAL_TABLET | Freq: Every day | ORAL | Status: DC

## 2017-11-17 MED ORDER — OXYCODONE HCL 5 MG PO TABS: 10.0000 mg | ORAL_TABLET | ORAL | Status: DC | PRN

## 2017-11-17 MED ORDER — KETAMINE HCL-SODIUM CHLORIDE 100-0.9 MG/10ML-% IV SOSY
PREFILLED_SYRINGE | INTRAVENOUS | Status: AC
  Filled 2017-11-17: qty 10

## 2017-11-17 MED ORDER — ONDANSETRON HCL 4 MG/2ML IJ SOLN
INTRAMUSCULAR | Status: DC | PRN
  Administered 2017-11-17: 4 mg via INTRAVENOUS

## 2017-11-17 MED ORDER — ACETAMINOPHEN 10 MG/ML IV SOLN
INTRAVENOUS | Status: AC
  Filled 2017-11-17: qty 100

## 2017-11-17 MED ORDER — OXYCODONE HCL 5 MG PO TABS
5.0000 mg | ORAL_TABLET | Freq: Once | ORAL | Status: AC | PRN
  Administered 2017-11-17: 5 mg via ORAL

## 2017-11-17 MED ORDER — SODIUM CHLORIDE 0.9 % IV SOLN: INTRAVENOUS | Status: DC

## 2017-11-17 MED ORDER — OXYCODONE HCL 5 MG PO TABS
ORAL_TABLET | ORAL | Status: AC
  Filled 2017-11-17: qty 1

## 2017-11-17 MED ORDER — CALCIUM CARBONATE-VITAMIN D 500-200 MG-UNIT PO TABS
1.0000 | ORAL_TABLET | Freq: Every day | ORAL | Status: DC
  Administered 2017-11-18 – 2017-11-20 (×3): 1 via ORAL
  Filled 2017-11-17 (×3): qty 1

## 2017-11-17 MED ORDER — EPHEDRINE SULFATE 50 MG/ML IJ SOLN
INTRAMUSCULAR | Status: DC | PRN
  Administered 2017-11-17 (×5): 10 mg via INTRAVENOUS

## 2017-11-17 MED ORDER — CHLORHEXIDINE GLUCONATE 4 % EX LIQD: 60.0000 mL | Freq: Once | CUTANEOUS | Status: DC

## 2017-11-17 MED ORDER — ALBUMIN HUMAN 5 % IV SOLN
INTRAVENOUS | Status: DC | PRN
Start: 2017-11-17 — End: 2017-11-17
  Administered 2017-11-17 (×3): via INTRAVENOUS

## 2017-11-17 MED ORDER — BUPIVACAINE HCL (PF) 0.5 % IJ SOLN
INTRAMUSCULAR | Status: AC
  Filled 2017-11-17: qty 30

## 2017-11-17 MED ORDER — HYDROCODONE-ACETAMINOPHEN 10-325 MG PO TABS
1.0000 | ORAL_TABLET | ORAL | Status: DC | PRN
  Administered 2017-11-17 – 2017-11-19 (×2): 1 via ORAL
  Filled 2017-11-17 (×2): qty 1

## 2017-11-17 MED ORDER — PROPOFOL 500 MG/50ML IV EMUL
INTRAVENOUS | Status: DC | PRN
  Administered 2017-11-17: 20 ug/kg/min via INTRAVENOUS

## 2017-11-17 MED ORDER — PROPOFOL 10 MG/ML IV BOLUS
INTRAVENOUS | Status: AC
  Filled 2017-11-17: qty 20

## 2017-11-17 MED ORDER — 0.9 % SODIUM CHLORIDE (POUR BTL) OPTIME
TOPICAL | Status: DC | PRN
  Administered 2017-11-17 (×2): 1000 mL

## 2017-11-17 MED ORDER — DEXAMETHASONE SODIUM PHOSPHATE 10 MG/ML IJ SOLN
INTRAMUSCULAR | Status: DC | PRN
  Administered 2017-11-17: 8 mg via INTRAVENOUS

## 2017-11-17 MED ORDER — PHENYLEPHRINE HCL 10 MG/ML IJ SOLN
INTRAMUSCULAR | Status: DC | PRN
  Administered 2017-11-17 (×5): 80 ug via INTRAVENOUS
  Administered 2017-11-17: 40 ug via INTRAVENOUS
  Administered 2017-11-17: 100 ug via INTRAVENOUS
  Administered 2017-11-17 (×2): 80 ug via INTRAVENOUS

## 2017-11-17 MED ORDER — FENTANYL CITRATE (PF) 100 MCG/2ML IJ SOLN
INTRAMUSCULAR | Status: DC | PRN
  Administered 2017-11-17: 100 ug via INTRAVENOUS
  Administered 2017-11-17: 50 ug via INTRAVENOUS

## 2017-11-17 MED ORDER — ONDANSETRON HCL 4 MG/2ML IJ SOLN: 4.0000 mg | Freq: Once | INTRAMUSCULAR | Status: DC | PRN

## 2017-11-17 MED ORDER — PROPOFOL 10 MG/ML IV BOLUS
INTRAVENOUS | Status: DC | PRN
  Administered 2017-11-17: 10 mg via INTRAVENOUS
  Administered 2017-11-17: 50 mg via INTRAVENOUS
  Administered 2017-11-17: 20 mg via INTRAVENOUS
  Administered 2017-11-17: 200 mg via INTRAVENOUS
  Administered 2017-11-17 (×2): 20 mg via INTRAVENOUS

## 2017-11-17 MED ORDER — SODIUM CHLORIDE 0.9% FLUSH
3.0000 mL | Freq: Two times a day (BID) | INTRAVENOUS | Status: DC
  Administered 2017-11-17 – 2017-11-19 (×5): 3 mL via INTRAVENOUS

## 2017-11-17 MED ORDER — GLYCOPYRROLATE 0.2 MG/ML IJ SOLN
INTRAMUSCULAR | Status: DC | PRN
  Administered 2017-11-17: 0.2 mg via INTRAVENOUS

## 2017-11-17 MED ORDER — DOCUSATE SODIUM 100 MG PO CAPS
100.0000 mg | ORAL_CAPSULE | Freq: Two times a day (BID) | ORAL | Status: DC
  Administered 2017-11-17 – 2017-11-20 (×6): 100 mg via ORAL
  Filled 2017-11-17 (×6): qty 1

## 2017-11-17 MED ORDER — ACETAMINOPHEN 10 MG/ML IV SOLN
INTRAVENOUS | Status: DC | PRN
Start: 2017-11-17 — End: 2017-11-17
  Administered 2017-11-17: 1000 mg via INTRAVENOUS

## 2017-11-17 MED ORDER — LACTATED RINGERS IV SOLN
INTRAVENOUS | Status: DC | PRN
  Administered 2017-11-17 (×2): via INTRAVENOUS

## 2017-11-17 MED ORDER — VITAMIN B-12 1000 MCG PO TABS
1000.0000 ug | ORAL_TABLET | Freq: Every day | ORAL | Status: DC
  Administered 2017-11-18 – 2017-11-20 (×3): 1000 ug via ORAL
  Filled 2017-11-17 (×3): qty 1

## 2017-11-17 MED ORDER — PROPOFOL 1000 MG/100ML IV EMUL
INTRAVENOUS | Status: AC
  Filled 2017-11-17: qty 200

## 2017-11-17 MED ORDER — GABAPENTIN 100 MG PO CAPS
100.0000 mg | ORAL_CAPSULE | Freq: Three times a day (TID) | ORAL | Status: DC
  Administered 2017-11-17 – 2017-11-20 (×9): 100 mg via ORAL
  Filled 2017-11-17 (×9): qty 1

## 2017-11-17 MED ORDER — MIDAZOLAM HCL 5 MG/5ML IJ SOLN
INTRAMUSCULAR | Status: DC | PRN
  Administered 2017-11-17: 1 mg via INTRAVENOUS

## 2017-11-17 MED ORDER — MORPHINE SULFATE (PF) 2 MG/ML IV SOLN: 1.0000 mg | INTRAVENOUS | Status: DC | PRN

## 2017-11-17 MED ORDER — SODIUM CHLORIDE 0.9 % IV SOLN
250.0000 mL | INTRAVENOUS | Status: DC
  Administered 2017-11-17: 250 mL via INTRAVENOUS

## 2017-11-17 MED ORDER — ALUM & MAG HYDROXIDE-SIMETH 200-200-20 MG/5ML PO SUSP: 30.0000 mL | Freq: Four times a day (QID) | ORAL | Status: DC | PRN

## 2017-11-17 MED ORDER — BUPIVACAINE HCL 0.5 % IJ SOLN
INTRAMUSCULAR | Status: DC | PRN
  Administered 2017-11-17: 20 mL

## 2017-11-17 MED ORDER — KETAMINE HCL 10 MG/ML IJ SOLN
INTRAMUSCULAR | Status: DC | PRN
  Administered 2017-11-17: 10 mg via INTRAVENOUS
  Administered 2017-11-17: 25 mg via INTRAVENOUS

## 2017-11-17 MED ORDER — OXYCODONE HCL 5 MG/5ML PO SOLN: 5.0000 mg | Freq: Once | ORAL | Status: AC | PRN

## 2017-11-17 MED ORDER — ACETAMINOPHEN 325 MG PO TABS
650.0000 mg | ORAL_TABLET | ORAL | Status: DC | PRN
  Administered 2017-11-19: 650 mg via ORAL
  Filled 2017-11-17: qty 2

## 2017-11-17 MED ORDER — FENTANYL CITRATE (PF) 250 MCG/5ML IJ SOLN
INTRAMUSCULAR | Status: AC
  Filled 2017-11-17: qty 5

## 2017-11-17 MED ORDER — SODIUM CHLORIDE 0.9% FLUSH
3.0000 mL | INTRAVENOUS | Status: DC | PRN
Start: 2017-11-17 — End: 2017-11-20

## 2017-11-17 MED ORDER — FENTANYL CITRATE (PF) 100 MCG/2ML IJ SOLN
INTRAMUSCULAR | Status: AC
  Filled 2017-11-17: qty 2

## 2017-11-17 MED ORDER — BISACODYL 5 MG PO TBEC
5.0000 mg | DELAYED_RELEASE_TABLET | Freq: Every day | ORAL | Status: DC | PRN
  Administered 2017-11-19: 5 mg via ORAL
  Filled 2017-11-17: qty 1

## 2017-11-17 MED ORDER — MENTHOL 3 MG MT LOZG
1.0000 | LOZENGE | OROMUCOSAL | Status: DC | PRN
Start: 2017-11-17 — End: 2017-11-20

## 2017-11-17 MED ORDER — PHENYLEPHRINE HCL 10 MG/ML IJ SOLN
INTRAVENOUS | Status: DC | PRN
  Administered 2017-11-17: 25 ug/min via INTRAVENOUS

## 2017-11-17 MED ORDER — CLINDAMYCIN PHOSPHATE 900 MG/6ML IV SOLN
900.0000 mg | INTRAVENOUS | Status: AC
  Administered 2017-11-17: 900 mg via INTRAVENOUS
  Filled 2017-11-17: qty 6

## 2017-11-17 MED ORDER — FENTANYL CITRATE (PF) 100 MCG/2ML IJ SOLN
25.0000 ug | INTRAMUSCULAR | Status: DC | PRN
  Administered 2017-11-17: 50 ug via INTRAVENOUS

## 2017-11-17 MED ORDER — POLYETHYLENE GLYCOL 3350 17 G PO PACK: 17.0000 g | PACK | Freq: Every day | ORAL | Status: DC | PRN

## 2017-11-17 MED ORDER — THROMBIN (RECOMBINANT) 20000 UNITS EX SOLR
CUTANEOUS | Status: AC
  Filled 2017-11-17: qty 20000

## 2017-11-17 MED ORDER — ACETAMINOPHEN 650 MG RE SUPP: 650.0000 mg | RECTAL | Status: DC | PRN

## 2017-11-17 MED ORDER — FLEET ENEMA 7-19 GM/118ML RE ENEM: 1.0000 | ENEMA | Freq: Once | RECTAL | Status: DC | PRN

## 2017-11-17 MED ORDER — SUCCINYLCHOLINE CHLORIDE 200 MG/10ML IV SOSY
PREFILLED_SYRINGE | INTRAVENOUS | Status: DC | PRN
  Administered 2017-11-17: 200 mg via INTRAVENOUS

## 2017-11-17 MED ORDER — METHOCARBAMOL 500 MG PO TABS
500.0000 mg | ORAL_TABLET | Freq: Four times a day (QID) | ORAL | Status: DC | PRN
  Administered 2017-11-17 – 2017-11-19 (×5): 500 mg via ORAL
  Filled 2017-11-17 (×5): qty 1

## 2017-11-17 MED ORDER — DULOXETINE HCL 60 MG PO CPEP: 60.0000 mg | ORAL_CAPSULE | Freq: Every day | ORAL | Status: DC

## 2017-11-17 MED ORDER — KETOROLAC TROMETHAMINE 15 MG/ML IJ SOLN
15.0000 mg | Freq: Four times a day (QID) | INTRAMUSCULAR | Status: AC
  Administered 2017-11-17 – 2017-11-18 (×4): 15 mg via INTRAVENOUS
  Filled 2017-11-17 (×4): qty 1

## 2017-11-17 MED ORDER — VANCOMYCIN HCL IN DEXTROSE 1-5 GM/200ML-% IV SOLN
1000.0000 mg | Freq: Once | INTRAVENOUS | Status: AC
  Administered 2017-11-17: 1000 mg via INTRAVENOUS
  Filled 2017-11-17: qty 200

## 2017-11-17 MED ORDER — BUPIVACAINE LIPOSOME 1.3 % IJ SUSP
20.0000 mL | INTRAMUSCULAR | Status: AC
  Administered 2017-11-17: 20 mL
  Filled 2017-11-17: qty 20

## 2017-11-17 MED ORDER — PHENOL 1.4 % MT LIQD: 1.0000 | OROMUCOSAL | Status: DC | PRN

## 2017-11-17 MED ORDER — HYDROCODONE-ACETAMINOPHEN 7.5-325 MG PO TABS
1.0000 | ORAL_TABLET | Freq: Four times a day (QID) | ORAL | Status: DC
  Administered 2017-11-17 – 2017-11-20 (×12): 1 via ORAL
  Filled 2017-11-17 (×12): qty 1

## 2017-11-17 MED ORDER — TRAMADOL-ACETAMINOPHEN 37.5-325 MG PO TABS: 1.0000 | ORAL_TABLET | Freq: Four times a day (QID) | ORAL | Status: DC | PRN

## 2017-11-17 MED ORDER — METHOCARBAMOL 1000 MG/10ML IJ SOLN: 500.0000 mg | Freq: Four times a day (QID) | INTRAVENOUS | Status: DC | PRN

## 2017-11-17 MED ORDER — ONDANSETRON HCL 4 MG PO TABS: 4.0000 mg | ORAL_TABLET | Freq: Four times a day (QID) | ORAL | Status: DC | PRN

## 2017-11-17 MED ORDER — MIDAZOLAM HCL 2 MG/2ML IJ SOLN
INTRAMUSCULAR | Status: AC
  Filled 2017-11-17: qty 2

## 2017-11-17 SURGICAL SUPPLY — 50 items
BENZOIN TINCTURE PRP APPL 2/3 (GAUZE/BANDAGES/DRESSINGS) ×3 IMPLANT
BUR SABER RD CUTTING 3.0 (BURR) ×2 IMPLANT
BUR SABER RD CUTTING 3.0MM (BURR) ×1
CANISTER SUCT 3000ML PPV (MISCELLANEOUS) ×3 IMPLANT
CLOSURE WOUND 1/2 X4 (GAUZE/BANDAGES/DRESSINGS) ×1
COVER MAYO STAND STRL (DRAPES) ×3 IMPLANT
COVER SURGICAL LIGHT HANDLE (MISCELLANEOUS) ×3 IMPLANT
DRAPE C-ARM 42X72 X-RAY (DRAPES) IMPLANT
DRAPE HALF SHEET 40X57 (DRAPES) IMPLANT
DRAPE MICROSCOPE LEICA (MISCELLANEOUS) ×3 IMPLANT
DRAPE SURG 17X23 STRL (DRAPES) ×12 IMPLANT
DRSG MEPILEX BORDER 4X12 (GAUZE/BANDAGES/DRESSINGS) ×3 IMPLANT
DRSG MEPILEX BORDER 4X4 (GAUZE/BANDAGES/DRESSINGS) IMPLANT
DRSG MEPILEX BORDER 4X8 (GAUZE/BANDAGES/DRESSINGS) IMPLANT
DURAPREP 26ML APPLICATOR (WOUND CARE) ×3 IMPLANT
ELECT BLADE 4.0 EZ CLEAN MEGAD (MISCELLANEOUS) ×3
ELECT CAUTERY BLADE 6.4 (BLADE) ×3 IMPLANT
ELECT REM PT RETURN 9FT ADLT (ELECTROSURGICAL) ×3
ELECTRODE BLDE 4.0 EZ CLN MEGD (MISCELLANEOUS) ×1 IMPLANT
ELECTRODE REM PT RTRN 9FT ADLT (ELECTROSURGICAL) ×1 IMPLANT
GLOVE BIOGEL PI IND STRL 8 (GLOVE) ×1 IMPLANT
GLOVE BIOGEL PI INDICATOR 8 (GLOVE) ×2
GLOVE ECLIPSE 8.5 STRL (GLOVE) ×3 IMPLANT
GLOVE ORTHO TXT STRL SZ7.5 (GLOVE) ×3 IMPLANT
GLOVE SURG 8.5 LATEX PF (GLOVE) ×3 IMPLANT
GOWN STRL REUS W/ TWL LRG LVL3 (GOWN DISPOSABLE) ×1 IMPLANT
GOWN STRL REUS W/TWL 2XL LVL3 (GOWN DISPOSABLE) ×6 IMPLANT
GOWN STRL REUS W/TWL LRG LVL3 (GOWN DISPOSABLE) ×2
KIT BASIN OR (CUSTOM PROCEDURE TRAY) ×3 IMPLANT
KIT ROOM TURNOVER OR (KITS) ×3 IMPLANT
NEEDLE SPNL 18GX3.5 QUINCKE PK (NEEDLE) ×6 IMPLANT
NS IRRIG 1000ML POUR BTL (IV SOLUTION) ×3 IMPLANT
PACK LAMINECTOMY ORTHO (CUSTOM PROCEDURE TRAY) ×3 IMPLANT
PAD ARMBOARD 7.5X6 YLW CONV (MISCELLANEOUS) ×6 IMPLANT
PATTIES SURGICAL .5 X.5 (GAUZE/BANDAGES/DRESSINGS) IMPLANT
PATTIES SURGICAL .75X.75 (GAUZE/BANDAGES/DRESSINGS) IMPLANT
SPONGE LAP 4X18 X RAY DECT (DISPOSABLE) IMPLANT
SPONGE SURGIFOAM ABS GEL 100 (HEMOSTASIS) ×3 IMPLANT
STRIP CLOSURE SKIN 1/2X4 (GAUZE/BANDAGES/DRESSINGS) ×2 IMPLANT
SUT VIC AB 1 CTX 36 (SUTURE) ×2
SUT VIC AB 1 CTX36XBRD ANBCTR (SUTURE) ×1 IMPLANT
SUT VIC AB 2-0 CT1 27 (SUTURE) ×2
SUT VIC AB 2-0 CT1 TAPERPNT 27 (SUTURE) ×1 IMPLANT
SUT VIC AB 3-0 X1 27 (SUTURE) ×3 IMPLANT
SUT VICRYL 0 UR6 27IN ABS (SUTURE) ×3 IMPLANT
SYR 20CC LL (SYRINGE) ×3 IMPLANT
SYR CONTROL 10ML LL (SYRINGE) ×3 IMPLANT
TOWEL OR 17X24 6PK STRL BLUE (TOWEL DISPOSABLE) ×3 IMPLANT
TOWEL OR 17X26 10 PK STRL BLUE (TOWEL DISPOSABLE) ×3 IMPLANT
WATER STERILE IRR 1000ML POUR (IV SOLUTION) ×3 IMPLANT

## 2017-11-17 NOTE — Interval H&P Note (Signed)
History and Physical Interval Note:  11/17/2017 7:25 AM  [name] [name]  has presented today for surgery, with the diagnosis of recurrent lumbar spinal stenosis L3-4, L4-5 and L5-S1, new lumbar spinal stenosis L2-3  The various methods of treatment have been discussed with the patient and family. After consideration of risks, benefits and other options for treatment, the patient has consented to  Procedure(s): L2-3 LAMINECTOMY AND REDO LAMINECTOMIES  L3-4, L4-5 AND L5-S1 (N/A) as a surgical intervention .  The patient's history has been reviewed, patient examined, no change in status, stable for surgery.  I have reviewed the patient's chart and labs.  Questions were answered to the patient's satisfaction.     Vira BrownsJames Hermina Barnard

## 2017-11-17 NOTE — Progress Notes (Signed)
Pharmacy Progress Note:  Pharmacy consulted to dose vancomycin for surgical prophylaxis. Patient received vancomycin 1500 mg IV once pre-op. He has no drains in place post op. Will schedule vancomycin 1 gm IV once 12 hours after previous dose and sign off.   Vinnie LevelBenjamin Mionna Advincula, PharmD., BCPS Clinical Pharmacist Pager [phone]

## 2017-11-17 NOTE — Progress Notes (Signed)
Pt has spoken with Dr Noreene LarssonJoslin x 2 re: r shoulder pain / his only complaint / no treatment ordered..hospital/o prior shoulder pain and is assumed may be rellated to positioniong

## 2017-11-17 NOTE — Op Note (Signed)
11/17/2017  1:05 PM  PATIENT:  [name] [name]  62 y.o. male  MRN: [id number]  OPERATIVE REPORT  PRE-OPERATIVE DIAGNOSIS:  recurrent lumbar spinal stenosis L3-4, L4-5 and L5-S1, new lumbar spinal stenosis L2-3  POST-OPERATIVE DIAGNOSIS:  recurrent lumbar spinal stenosis L3-4, L4-5 and L5-S1, new lumbar spinal stenosis L2-3  PROCEDURE:  Procedure(s): L2-3 LAMINECTOMY AND REDO LAMINECTOMIES  L3-4, L4-5 AND L5-S1    SURGEON:  Jessy Oto, MD     ASSISTANT:  Benjiman Core, PA-C  (Present throughout the entire procedure and necessary for completion of procedure in a timely manner)     ANESTHESIA:  General,    COMPLICATIONS:  None.     EBL 925CC  CELL SAVER RETURNED: 250CC  DRAINS: Foley to SD.       PROCEDURE: The patient was met in the holding area, and the appropriateL2-3 to right L4-5 levels identified and marked with an "X" and my initials. The patient was then transported to OR and was placed under general anesthesia without difficulty. Then placed on the operative table in a prone position. The [facility] spine OR table was used. The patient received appropriate preoperative antibiotic prophylaxis vancomycin. Nursing staff inserted a Foley catheter under sterile conditions prior to turning. Standard prep with DuraPrep solution from the mid dorsal spine to the mid sacral level.Time-out procedure was called and correct .  Patient was draped in the usual manner. Iodine Vi-Drape was used.Incision was placed at the the expected levels. Intraoperative lateral radiograph demonstrated the upper clamp at the L2-3 level and the lower needle placed posterior to the S1-S2 level. Initial incision was made at the very superior aspect of the previous incision scar and this was at first palpable process representing L1 and L2 and at the lowest aspect of the incision at the expected level of S1-S2 approximately 41/2 - 6 inches in length. The incision were infiltrated with Marcaine half percent 1:1 Exparel  1.3% . Total of 20 cc were used. Skin subcutaneous layers divide down to the lumbodorsal fascia this was incised in both L2 spinous process and lumbosacral spinous process and clamps placed at the interspinous process at L2-3 and S1-S2. A lateral radiograph demonstrated the upper clamp at the L2-3 interspinous space. Lower clamp was at the S1 spinous process. The L2-3 to L5-S1 levels were then exposed using Cobb elevators and electrocautery. These areas were then packed.Viper retractor was inserted at the incision site. Leksell rongeur used to remove the spinous process of L2 and the inferior 40% of L1.  Leksell rongeur was then used to further remove bone down to the ligamentum flavum and the central portions of the lamina and base of the spinous process at L1 and L2 were thinned. This facets were then exposed out laterally and were hypertrophic.The scar at the previous central laminectomy L2-3, L3-4, L4-5 and L5-S1 was removed where it bordered the residual laminectomy and facets at each level. A high speed 3 mm round cutting  burr thenused to thin the medial residual lamina at each level and centrally at L1,L2 and L3.  Osteotomes then used medial aspect of the L1-2, L2-3, L3-4 facets, L4-5 and L5-S1  facets bilaterally resecting approximately 10-15% of the facet bilaterally. Kerrisons were then used to resect remaining medial portions of central portions of the lamina of L4 and L3. Ligamentum flavum at the L1-2, L2-3 L3-4 and L4-5 levels were then carefully resected centrally preserving at least 7-8 mm with at the pars level L2, L# L4 and L5. The  central laminectomy was further widened performing more resection of the medial aspect of facet at both sides L1-2, L2-3, L3-4, L4-5 and L5-S1. Note that loupe magnification and headlight was used for this portion procedure.the central portions of the ligamentum flavum at the L2-3 level were resected and the medial aspect of the L2-3 facet resected over about 5-10%.  Hypertrophic flava was found to be present. The operating room microscope used then carefullyassist with the left side decompressed at each level beginning at the left L1-2 neuroforamen resect bone over the superior lamina of L2 and decompressing the left L2 foramen and reflected portion ligamentum flavum off the medial aspect of the left L1-2 facet. Woodson nerve probe could be passed out both the L1 and L2 neuroforamen. The medial aspect of facet at the L2-3 level was then carefully evaluated and hypertrophic ligamentum flavum resected using Kerrisons decompressing the lateral recess on this right side. This was done such that [facility] nerve probe could be used to pass the outer recess demonstrating patency and decompression of the L3 nerve root. Attention then turned to the L3-4 were further debridement of the lateral recess and hypertrophic ligamentum flavum and reflected portions of ligamentum flavum were carried out. Decompression was carried out in similar fashion on the right side from the right L2 nerve root to the L5-S1level. At the right L2-3 and L3-4 and L4-5 levels severe lateral recess stenosis was noted lateral recess was decompressed and the L2,L3, L4 and L5 nerve roots, These roots were freed up and hyper trophic ligamentum flavum and reflected flavum from the medial and ventral aspect of each facet was noted to be impressing on the nerve root at each of the respective neuroforamen. Under the OR microscope Derricho retractor used to retract the thecal sac on the right side and the right L4-5 facet partially resected. Retracting carefully then the right L5 and L5  nerve root were decompressed with kerrisons and  micropituitary rongeur used to remove osteotomized bone debris . Further decompression was then carried out the right side of the spinal canal decompressing the L1, L2, L3 and L4 and L5 and S1 nerve roots. Following this then a hockey-stick nerve probe could be passed out easily bilateral  L2-L3 and L4 and L5 and S1 neuroforamen. Following decompression bilaterally thrombin-soaked Gelfoam was applied to the laminotomy defects and these areas packed with small sponges. Adequate hemostasis was obtained at all levels. The viper retractor was then removed. At the end of the laminectomy procedure an approximately 15 mm the central laminotomy was present with normal pulsation of the thecal sac present. Following additional irrigation and with no active bleeding present at any level level, the incision was then closed by first approximating her lumbar muscles the midline with interrupted #1 Vicryl sutures loosely the lumbodorsal fascia then approximated with interrupted #1 Vicryl sutures. Deep subcutaneous layers approximated with interrupted #1-0 Vicryl suture more superficial layers with interrupted 2-0 Vicryl suture the skin closed with a running subcutaneous stitch of 4-0 Vicryl at both levels. All Mepilex bandage is applied to both sites. All instrument and sponge counts were correct. Patient was then reactivated returned to supine position and extubated. He was then returned to recovery room in satisfactory condition.  Benjiman Core, PA-C for the duties of assistant surgeon throughout this case He assisted using loupe and microscope magnification retracting delicate neural structures suctioning over the counter neural structures throughout the cane bilaterally at the lower bar segment and superiorly. Is present from the beginning of the  case to the end of the case. Assisted in positioning the patient having in positioning the arm and legs. He also participated in removal the patient from the operating table.   Basil Dess  11/17/2017, 1:05 PM

## 2017-11-17 NOTE — Anesthesia Postprocedure Evaluation (Signed)
Anesthesia Post Note  Patient: Scot Junrnest Westbrook  Procedure(s) Performed: L2-3 LAMINECTOMY AND REDO LAMINECTOMIES  L3-4, L4-5 AND L5-S1 (N/A Back)     Patient location during evaluation: PACU Anesthesia Type: General Level of consciousness: awake and alert Pain management: pain level controlled Vital Signs Assessment: post-procedure vital signs reviewed and stable Respiratory status: spontaneous breathing, nonlabored ventilation, respiratory function stable and patient connected to nasal cannula oxygen Cardiovascular status: blood pressure returned to baseline and stable Postop Assessment: no apparent nausea or vomiting Anesthetic complications: no    Last Vitals:  Vitals:   11/17/17 1358 11/17/17 1400  BP: 124/80 (!) 136/53  Pulse: 80 88  Resp: 18 16  Temp:  36.9 C  SpO2: 98% 98%    Last Pain:  Vitals:   11/17/17 1400  PainSc: 3                  Kenyona Rena COKER

## 2017-11-17 NOTE — Brief Op Note (Signed)
11/17/2017  12:51 PM  PATIENT:  [name] [name]  62 y.o. male  PRE-OPERATIVE DIAGNOSIS:  recurrent lumbar spinal stenosis L3-4, L4-5 and L5-S1, new lumbar spinal stenosis L2-3  POST-OPERATIVE DIAGNOSIS:  recurrent lumbar spinal stenosis L3-4, L4-5 and L5-S1, new lumbar spinal stenosis L2-3  PROCEDURE:  Procedure(s): L2-3 LAMINECTOMY AND REDO LAMINECTOMIES  L3-4, L4-5 AND L5-S1 (N/A)  SURGEON:  Surgeon(s) and Role:    * Kerrin ChampagneNitka, James E, MD - Primary  PHYSICIAN ASSISTANT: Zonia KiefJames Owens, PA-C ANESTHESIA:   local and general, Dr. Noreene LarssonJoslin.  EBL:  925 mL   BLOOD ADMINISTERED:250 CC CELLSAVER  DRAINS: Urinary Catheter (Foley)   LOCAL MEDICATIONS USED:  MARCAINE 0.5% 1:1 EXPAREL 1.3% Amount: 20 ml  SPECIMEN:  No Specimen  DISPOSITION OF SPECIMEN:  N/A  COUNTS:  YES  TOURNIQUET:  * No tourniquets in log *  DICTATION: .Dragon Dictation  PLAN OF CARE: Admit to inpatient   PATIENT DISPOSITION:  PACU - hemodynamically stable.   Delay start of Pharmacological VTE agent (>24hrs) due to surgical blood loss or risk of bleeding: yes

## 2017-11-17 NOTE — Progress Notes (Signed)
Anesthesiology Note:  62 year old male with morbid obesity S/P gastric sleeve, OSA on C-PAP, H/O sepsis and pyelonephritis with acute renal failure from ureteral stone 12/2013. He gave no previous history of shoulder pain  Today he underwent  L2-3, L3-4, L4-5, L5-S1 laminectomies for spinal stenosis. Surgery lasted 6 hours. Following induction of general anesthesia, he was placed in the prone position with usual padding and positioning precautions. Arms were placed at 90 degrees forward and padded per routine. Surgery was uneventful. In the recovery room, he has complained of right shoulder pain. Able to move the shoulder without difficulty.   Impression: Post-op R. Shoulder pain following 6 hour lumbar decompression with both arms forward at 90 degrees.  Plan: Observe, sling for comfort as needed. Morphine and oxycodone ordered for pain. Will follow.  [name] [name]

## 2017-11-17 NOTE — Anesthesia Preprocedure Evaluation (Signed)
Anesthesia Evaluation  Patient identified by MRN, date of birth, ID band Patient awake    Reviewed: Allergy & Precautions, NPO status , Patient's Chart, lab work & pertinent test results  Airway Mallampati: II  TM Distance: >3 FB Neck ROM: Full    Dental  (+) Teeth Intact, Dental Advisory Given   Pulmonary former smoker,    breath sounds clear to auscultation       Cardiovascular hypertension,  Rhythm:Regular Rate:Normal     Neuro/Psych    GI/Hepatic   Endo/Other    Renal/GU      Musculoskeletal   Abdominal (+) + obese,   Peds  Hematology   Anesthesia Other Findings   Reproductive/Obstetrics                            Anesthesia Physical Anesthesia Plan  ASA: III  Anesthesia Plan: General   Post-op Pain Management:    Induction: Intravenous  PONV Risk Score and Plan: Ondansetron and Dexamethasone  Airway Management Planned: Oral ETT  Additional Equipment:   Intra-op Plan:   Post-operative Plan: Extubation in OR  Informed Consent: I have reviewed the patients History and Physical, chart, labs and discussed the procedure including the risks, benefits and alternatives for the proposed anesthesia with the patient or authorized representative who has indicated his/her understanding and acceptance.     Dental advisory given  Plan Discussed with: CRNA and Anesthesiologist  Anesthesia Plan Comments:         Anesthesia Quick Evaluation  

## 2017-11-17 NOTE — Evaluation (Signed)
Physical Therapy Evaluation Patient Details Name: Abdias Hickam MRN: [id number] DOB: [birth year] Today's Date: 11/17/2017   History of Present Illness  Pt s/p L2-3 LAMINECTOMY AND REDO LAMINECTOMIES  L3-4, L4-5 AND L5-S1. PMH - lumbar laminectomy, Obesity, OSA, ckd, arthritis  Clinical Impression  Pt presents to PT with decr mobility due to LE weakness. Pt with lt knee beginning to buckle after short distance ambulation. Expect pt will make steady progress but will likely need to amb short frequent distances instead of longer distances. Wife very supportive.    Follow Up Recommendations Home health PT;Supervision/Assistance - 24 hour    Equipment Recommendations  None recommended by PT    Recommendations for Other Services       Precautions / Restrictions Precautions Precautions: Back;Fall Restrictions Weight Bearing Restrictions: No      Mobility  Bed Mobility Overal bed mobility: Needs Assistance Bed Mobility: Sidelying to Sit;Sit to Sidelying   Sidelying to sit: Min assist     Sit to sidelying: Min assist General bed mobility comments: Assist to have pt pull trunk up into sitting. Assist with bring legs back up into bed. Difficult for pt to get onto side due to body habitus and size of bed  Transfers Overall transfer level: Needs assistance Equipment used: Rolling walker (2 wheeled) Transfers: Sit to/from Stand Sit to Stand: Min assist         General transfer comment: Pt with extended time to rise. Assist to bring hips up  Ambulation/Gait Ambulation/Gait assistance: Min assist;Mod assist Ambulation Distance (Feet): 12 Feet(x 2) Assistive device: Rolling walker (2 wheeled) Gait Pattern/deviations: Step-through pattern;Decreased step length - right;Decreased step length - left;Trunk flexed Gait velocity: decr Gait velocity interpretation: Below normal speed for age/gender General Gait Details: Assist for balance and support. Pt with lt knee in partial flexion  with stance. Began to have incr flexion/buckling as distance increased neccesitating having a recliner brought to pt quickly so he could sit  Information systems manager Rankin (Stroke Patients Only)       Balance Overall balance assessment: Needs assistance Sitting-balance support: No upper extremity supported;Feet supported Sitting balance-Leahy Scale: Good     Standing balance support: Bilateral upper extremity supported Standing balance-Leahy Scale: Poor Standing balance comment: walker and min assist for static standing                             Pertinent Vitals/Pain Pain Assessment: Faces Faces Pain Scale: Hurts little more Pain Location: rt shoulder Pain Descriptors / Indicators: Sore Pain Intervention(s): Limited activity within patient's tolerance;Monitored during session    Home Living Family/patient expects to be discharged to:: Private residence Living Arrangements: Spouse/significant other Available Help at Discharge: Family;Available 24 hours/day Type of Home: House Home Access: Stairs to enter   [employer] of Steps: 1 Home Layout: One level Home Equipment: Walker - 2 wheels;Bedside commode;Wheelchair - manual      Prior Function Level of Independence: Needs assistance   Gait / Transfers Assistance Needed: amb with rolling walker. Distance limited           Hand Dominance        Extremity/Trunk Assessment   Upper Extremity Assessment Upper Extremity Assessment: Defer to OT evaluation    Lower Extremity Assessment Lower Extremity Assessment: LLE deficits/detail LLE Deficits / Details: functionally lt quad weakness with knee beginning to buckle with amb  Communication   Communication: No difficulties  Cognition Arousal/Alertness: Awake/alert Behavior During Therapy: WFL for tasks assessed/performed Overall Cognitive Status: Within Functional Limits for tasks assessed                                         General Comments      Exercises     Assessment/Plan    PT Assessment Patient needs continued PT services  PT Problem List Decreased strength;Decreased activity tolerance;Decreased balance;Decreased mobility;Decreased knowledge of precautions;Obesity       PT Treatment Interventions DME instruction;Gait training;Functional mobility training;Therapeutic activities;Therapeutic exercise;Balance training;Patient/family education    PT Goals (Current goals can be found in the Care Plan section)  Acute Rehab PT Goals Patient Stated Goal: return home PT Goal Formulation: With patient Time For Goal Achievement: 11/24/17 Potential to Achieve Goals: Good    Frequency Min 5X/week   Barriers to discharge Other (comment) morbid obesity    Co-evaluation               AM-PAC PT "6 Clicks" Daily Activity  Outcome Measure Difficulty turning over in bed (including adjusting bedclothes, sheets and blankets)?: Unable Difficulty moving from lying on back to sitting on the side of the bed? : Unable Difficulty sitting down on and standing up from a chair with arms (e.g., wheelchair, bedside commode, etc,.)?: Unable Help needed moving to and from a bed to chair (including a wheelchair)?: A Little Help needed walking in hospital room?: A Lot Help needed climbing 3-5 steps with a railing? : Total 6 Click Score: 9    End of Session Equipment Utilized During Treatment: Gait belt Activity Tolerance: Patient limited by fatigue Patient left: in bed;with call bell/phone within reach;with family/visitor present Nurse Communication: Mobility status PT Visit Diagnosis: Unsteadiness on feet (R26.81);Other abnormalities of gait and mobility (R26.89);Muscle weakness (generalized) (M62.81)    Time: [id number] PT Time Calculation (min) (ACUTE ONLY): 29 min   Charges:   PT Evaluation $PT Eval Moderate Complexity: 1 Mod PT Treatments $Gait Training:  8-22 mins   PT G Codes:        [facility] Jeromiah Ohalloran PT [phone]   Angelina OkCary W [facility]aycok 11/17/2017, 4:50 PM

## 2017-11-17 NOTE — Anesthesia Procedure Notes (Signed)
Procedure Name: Intubation Date/Time: 11/17/2017 7:43 AM Performed by: Wilder GladeWinn, Dhillon Comunale G, CRNA Pre-anesthesia Checklist: Patient identified, Emergency Drugs available, Suction available, Patient being monitored and Timeout performed Patient Re-evaluated:Patient Re-evaluated prior to induction Oxygen Delivery Method: Circle system utilized Preoxygenation: Pre-oxygenation with 100% oxygen Induction Type: IV induction Ventilation: Mask ventilation without difficulty Laryngoscope Size: Miller and 2 Grade View: Grade I Tube type: Oral Tube size: 8.0 mm Number of attempts: 1 (brief atraumatic ) Airway Equipment and Method: Stylet Placement Confirmation: ETT inserted through vocal cords under direct vision,  positive ETCO2 and breath sounds checked- equal and bilateral Secured at: 24 cm Tube secured with: Tape Dental Injury: Teeth and Oropharynx as per pre-operative assessment

## 2017-11-17 NOTE — H&P (Addendum)
[name] [name] is an 62 y.o. male.   Chief Complaint: back pain and leg pain HPI: patient with hx of L2-S1 stenosis and above complaint presents for surgical intervention.  Failed conservative treatment.    Past Medical History:  Diagnosis Date  . Anemia   . Arthritis   . Chronic kidney disease    HX acute kidney failure / acute pyelonephritis / hydronephrosis / severe sepsis per discharge summary 12/27/13  . History of kidney stones   . Hypertension   . Morbid obesity (HCC)   . Obstructive sleep apnea    does not need c pap since 110 lb wt loss  . Osteoporosis   . Prurigo 2002  . Scars    ON ARMS FROM CHEMICAL EXPLOSION 1999  . Spinal stenosis     Past Surgical History:  Procedure Laterality Date  . BACK SURGERY  01/23/2010   lumbar  . CHOLECYSTECTOMY N/A 03/24/2016   Procedure: LAPAROSCOPIC CHOLECYSTECTOMY WITH INTRAOPERATIVE CHOLANGIOGRAM;  Surgeon: Chevis Pretty III, MD;  Location: [facility];  Service: General;  Laterality: N/A;  . CIRCUMCISION    . CYSTOSCOPY WITH RETROGRADE PYELOGRAM, URETEROSCOPY AND STENT PLACEMENT Left 01/20/2014   Procedure: CYSTOSCOPY WITH RETROGRADE PYELOGRAM, URETEROSCOPY AND STENT EXCHANGE;  Surgeon: Milford Cage, MD;  Location: [facility];  Service: Urology;  Laterality: Left;  bugbee bladder fulguration  . CYSTOSCOPY WITH STENT PLACEMENT Left 12/28/2013   Procedure: CYSTOSCOPY WITH STENT PLACEMENT left retrograde pyleogram;  Surgeon: Milford Cage, MD;  Location: [facility];  Service: Urology;  Laterality: Left;  . ESOPHAGOGASTRODUODENOSCOPY N/A 12/07/2012   Procedure: ESOPHAGOGASTRODUODENOSCOPY (EGD);  Surgeon: Lodema Pilot, DO;  Location: [facility];  Service: General;  Laterality: N/A;  . HOLMIUM LASER APPLICATION Left 01/20/2014   Procedure: HOLMIUM LASER APPLICATION;  Surgeon: Milford Cage, MD;  Location: [facility];  Service: Urology;  Laterality: Left;  . LAPAROSCOPIC GASTRIC SLEEVE RESECTION N/A 12/07/2012   Procedure: LAPAROSCOPIC GASTRIC SLEEVE  RESECTION;  Surgeon: Lodema Pilot, DO;  Location: [facility];  Service: General;  Laterality: N/A;  laparoscopic sleeve gastrectomy with EGD    No family history on file. Social History:  reports that he quit smoking about 43 years ago. His smoking use included cigarettes. he has never used smokeless tobacco. He reports that he does not drink alcohol or use drugs.  Allergies:  Allergies  Allergen Reactions  . Unasyn [Ampicillin-Sulbactam Sodium] Hives, Itching, Swelling and Rash    Medications Prior to Admission  Medication Sig Dispense Refill  . acetaminophen (TYLENOL) 500 MG tablet Take 1,000 mg by mouth every 6 (six) hours as needed for mild pain or headache.    . calcium citrate-vitamin D (CITRACAL+D) 315-200 MG-UNIT per tablet Take 1 tablet by mouth daily.     . hydrocortisone cream 1 % Apply 1 application topically 2 (two) times daily as needed for itching.    Marland Kitchen ibuprofen (ADVIL,MOTRIN) 200 MG tablet Take 400 mg by mouth every 6 (six) hours as needed for moderate pain.     . meloxicam (MOBIC) 15 MG tablet TAKE 1 TABLET(15 MG) BY MOUTH DAILY. MAY TAKE 1/2 TABLET BY MOUTH 2 TIMES PER DAY (Patient taking differently: Take 15 mg by mouth daily) 30 tablet 0  . Multiple Vitamin (MULTIVITAMIN WITH MINERALS) TABS Take 1 tablet by mouth daily.     . traMADol-acetaminophen (ULTRACET) 37.5-325 MG tablet TAKE 1 TABLET BY MOUTH EVERY 6 HOURS AS NEEDED FOR MODERATE PAIN. 30 tablet 0  . vitamin B-12 (CYANOCOBALAMIN) 1000 MCG tablet Take  1,000 mcg by mouth daily.    . DULoxetine (CYMBALTA) 60 MG capsule TAKE 1 CAPSULE(60 MG) BY MOUTH DAILY (Patient not taking: Reported on 11/11/2017) 30 capsule 0    No results found for this or any previous visit (from the past 48 hour(s)). No results found.  Review of Systems  Constitutional: Negative.   HENT: Negative.   Eyes: Negative.   Respiratory: Negative.   Cardiovascular: Negative.   Gastrointestinal: Negative.   Genitourinary: Negative.    Musculoskeletal: Positive for back pain.  Skin: Negative.   Neurological: Positive for tingling.  Psychiatric/Behavioral: Negative.     Blood pressure (!) 156/91, pulse 63, temperature 98.1 F (36.7 C), resp. rate 20, SpO2 99 %. Physical Exam  Constitutional: He is oriented to person, place, and time. No distress.  HENT:  Head: Normocephalic.  Eyes: Pupils are equal, round, and reactive to light.  Respiratory: No respiratory distress.  GI: He exhibits no distension.  Musculoskeletal:    Range of Motion  Extension: abnormal  Flexion: normal  Lateral bend right: abnormal  Lateral bend left: abnormal  Rotation right: abnormal  Rotation left: abnormal   Muscle Strength  Right Quadriceps:  5/5  Left Quadriceps:  4/5  Right Hamstrings:  5/5  Left Hamstrings:  5/5   Tests  Straight leg raise right: negative Straight leg raise left: negative  Reflexes  Patellar: normal Achilles: normal Biceps: normal Babinski's sign: normal   Other  Toe walk: normal Heel walk: normal Sensation: normal Gait: normal   Comments:  :eft Foot DF 3/5, right foot is 5-/5 left knee strength is 4+/5 right is 5/5.     Neurological: He is alert and oriented to person, place, and time.  Skin: Skin is warm and dry.  Psychiatric: He has a normal mood and affect.     Assessment/Plan L2-S1 stenosis    We will proceed with L2-3 LAMINECTOMY AND REDO LAMINECTOMIES L3-4, L4-5 AND L5-S1 as scheduled..  Surgical procedure discussed in detail.  All questions answered.  Zonia KiefJames Owens, PA-C 11/17/2017, 6:27 AM

## 2017-11-17 NOTE — Transfer of Care (Signed)
Immediate Anesthesia Transfer of Care Note  Patient: [name] [name]  Procedure(s) Performed: L2-3 LAMINECTOMY AND REDO LAMINECTOMIES  L3-4, L4-5 AND L5-S1 (N/A Back)  Patient Location: PACU  Anesthesia Type:General  Level of Consciousness: awake, alert , oriented and patient cooperative  Airway & Oxygen Therapy: Patient Spontanous Breathing and Patient connected to face mask oxygen  Post-op Assessment: Report given to RN and Post -op Vital signs reviewed and stable  Post vital signs: Reviewed and stable  Last Vitals:  Vitals:   11/17/17 0601 11/17/17 1323  BP: (!) 156/91   Pulse: 63 84  Resp: 20 18  Temp: 36.7 C 36.5 C  SpO2: 99% 98%    Last Pain:  Vitals:   11/17/17 0642  PainSc: 5       Patients Stated Pain Goal: 3 (11/17/17 [id number])  Complications: No apparent anesthesia complications

## 2017-11-18 ENCOUNTER — Encounter (HOSPITAL_COMMUNITY): Payer: Self-pay | Admitting: Specialist

## 2017-11-18 ENCOUNTER — Other Ambulatory Visit: Payer: Self-pay

## 2017-11-18 LAB — CBC
HCT: 36 % — ABNORMAL LOW (ref 39.0–52.0)
HEMOGLOBIN: 11.6 g/dL — AB (ref 13.0–17.0)
MCH: 28 pg (ref 26.0–34.0)
MCHC: 32.2 g/dL (ref 30.0–36.0)
MCV: 86.7 fL (ref 78.0–100.0)
Platelets: 192 10*3/uL (ref 150–400)
RBC: 4.15 MIL/uL — ABNORMAL LOW (ref 4.22–5.81)
RDW: 15.1 % (ref 11.5–15.5)
WBC: 7.4 10*3/uL (ref 4.0–10.5)

## 2017-11-18 LAB — BASIC METABOLIC PANEL
Anion gap: 11 (ref 5–15)
BUN: 25 mg/dL — AB (ref 6–20)
CHLORIDE: 104 mmol/L (ref 101–111)
CO2: 24 mmol/L (ref 22–32)
CREATININE: 1.33 mg/dL — AB (ref 0.61–1.24)
Calcium: 8.6 mg/dL — ABNORMAL LOW (ref 8.9–10.3)
GFR calc Af Amer: >60 mL/min (ref >60)
GFR calc non Af Amer: 57 mL/min — ABNORMAL LOW (ref >60)
Glucose, Bld: 108 mg/dL — ABNORMAL HIGH (ref 65–99)
Potassium: 3.7 mmol/L (ref 3.5–5.1)
Sodium: 139 mmol/L (ref 135–145)

## 2017-11-18 NOTE — Progress Notes (Signed)
Placed patient on CPAP for the night via auto-mode with oxygen set at 2lpm  

## 2017-11-18 NOTE — Care Management Note (Signed)
Case Management Note  Patient Details  Name: [name] [name] MRN: [id number] Date of Birth: 1955-11-05  Subjective/Objective:  62 yr old male s/p L2-3 Laminectomy, and redo Laminectomies L3-4,4-5,L5-S1.                  Action/Plan: Case manager spoke with patient and wife concerning discharge plan and DME. Choice for Home Health Agency was offered, patient says they have used [facility] with previous surgery and want to do so now. CM called referral to Shon Milletan Phillips RN, [facility] Liaison. Patient will have family support at discharge.   Expected Discharge Date:   11/19/17               Expected Discharge Plan:  Home w Home Health Services  In-House Referral:  NA  Discharge planning Services  CM Consult  Post Acute Care Choice:  Home Health Choice offered to:  Patient, Spouse  DME Arranged:  (Has RW and 3in1) DME Agency:  NA  HH Arranged:  PT HH Agency:  [facility]  Status of Service:  Completed, signed off  If discussed at Long Length of Stay Meetings, dates discussed:    Additional Comments:  Durenda GuthrieBrady, Vicky Mccanless Naomi, RN 11/18/2017, 2:59 PM

## 2017-11-18 NOTE — Progress Notes (Signed)
Physical Therapy Treatment Patient Details Name: [name] [name] MRN: [id number] DOB: 10-05-1956 Today's Date: 11/18/2017    History of Present Illness Pt s/p L2-3 LAMINECTOMY AND REDO LAMINECTOMIES  L3-4, L4-5 AND L5-S1. PMH - lumbar laminectomy, Obesity, OSA, ckd, arthritis    PT Comments    Patient is progressing toward mobility goals. Pt was able to ambulate 5330ft bouts X4 with min A +2 and RW. Pt continues to demonstrate LE weakness. Continue to progress as tolerated.    Follow Up Recommendations  Home health PT;Supervision/Assistance - 24 hour     Equipment Recommendations  None recommended by PT    Recommendations for Other Services       Precautions / Restrictions Precautions Precautions: Back;Fall Precaution Comments: back precautions reviewed with pt and family member    Mobility  Bed Mobility               General bed mobility comments: pt sitting OOB in chair upon arrival  Transfers Overall transfer level: Needs assistance Equipment used: Rolling walker (2 wheeled) Transfers: Sit to/from Stand Sit to Stand: Min assist;Mod assist         General transfer comment: assist to power up into standing and to gain balance upon standing; pt stood X3 from recliner and needed increased assistance with last transfer  Ambulation/Gait Ambulation/Gait assistance: Min assist;+2 safety/equipment Ambulation Distance (Feet): (4630ft X 4 (13120ft total)) Assistive device: Rolling walker (2 wheeled) Gait Pattern/deviations: Step-through pattern;Decreased step length - right;Decreased step length - left;Trunk flexed Gait velocity: decr   General Gait Details: assist needed for balance due to bilat LE weakness (L > R); cues for posture and proximity to [employer]   Stairs            Wheelchair Mobility    Modified Rankin (Stroke Patients Only)       Balance Overall balance assessment: Needs assistance Sitting-balance support: No upper extremity supported;Feet  supported Sitting balance-Leahy Scale: Good     Standing balance support: Bilateral upper extremity supported Standing balance-Leahy Scale: Poor Standing balance comment: walker and min assist for static standing                            Cognition Arousal/Alertness: Awake/alert Behavior During Therapy: WFL for tasks assessed/performed Overall Cognitive Status: Within Functional Limits for tasks assessed                                        Exercises      General Comments        Pertinent Vitals/Pain Pain Assessment: No/denies pain    Home Living                      Prior Function            PT Goals (current goals can now be found in the care plan section) Acute Rehab PT Goals Patient Stated Goal: return home PT Goal Formulation: With patient Time For Goal Achievement: 11/24/17 Potential to Achieve Goals: Good Progress towards PT goals: Progressing toward goals    Frequency    Min 5X/week      PT Plan Current plan remains appropriate    Co-evaluation              AM-PAC PT "6 Clicks" Daily Activity  Outcome Measure  Difficulty turning over in bed (including  adjusting bedclothes, sheets and blankets)?: Unable Difficulty moving from lying on back to sitting on the side of the bed? : Unable Difficulty sitting down on and standing up from a chair with arms (e.g., wheelchair, bedside commode, etc,.)?: Unable Help needed moving to and from a bed to chair (including a wheelchair)?: A Little Help needed walking in hospital room?: A Lot Help needed climbing 3-5 steps with a railing? : Total 6 Click Score: 9    End of Session Equipment Utilized During Treatment: Gait belt Activity Tolerance: Patient tolerated treatment well Patient left: with call bell/phone within reach;with family/visitor present;in chair Nurse Communication: Mobility status PT Visit Diagnosis: Unsteadiness on feet (R26.81);Other abnormalities  of gait and mobility (R26.89);Muscle weakness (generalized) (M62.81)     Time: 1610-9604 PT Time Calculation (min) (ACUTE ONLY): 25 min  Charges:  $Gait Training: 8-22 mins $Therapeutic Activity: 8-22 mins                    G Codes:       Erline Levine, PTA Pager: [phone]     Carolynne Edouard 11/18/2017, 11:42 AM

## 2017-11-18 NOTE — Evaluation (Signed)
Occupational Therapy Evaluation Patient Details Name: [name] [name] MRN: [id number] DOB: [birth year] Today's Date: 11/18/2017    History of Present Illness Pt s/p L2-3 LAMINECTOMY AND REDO LAMINECTOMIES  L3-4, L4-5 AND L5-S1. PMH - lumbar laminectomy, Obesity, OSA, ckd, arthritis   Clinical Impression   Patient is s/p L2-3 Laminectomy and L3-4 4-5 L5-S1  surgery resulting in functional limitations due to the deficits listed below (see OT problem list). Pt will require (A) for LB bathing/ dressing currently but can use AE to maximize independence. Pt has AE already at home from previous surgery. In fact reports 3 reachers at home.  Patient will benefit from skilled OT acutely to increase independence and safety with ADLS to allow discharge home without follow .     Follow Up Recommendations  No OT follow up    Equipment Recommendations  None recommended by OT    Recommendations for Other Services       Precautions / Restrictions Precautions Precautions: Back;Fall Precaution Comments: back precautions reviewed with pt and family member handout provided Restrictions Weight Bearing Restrictions: No      Mobility Bed Mobility               General bed mobility comments: in chair on arrival  Transfers                      Balance                                           ADL either performed or assessed with clinical judgement   ADL Overall ADL's : Needs assistance/impaired Eating/Feeding: Independent   Grooming: [name]/dry hands   Upper Body Bathing: Independent   Lower Body Bathing: Maximal assistance Lower Body Bathing Details (indicate cue type and reason): will need AE and had AE at home from previous surg. pt familiar with sequence                 Tub/ Shower Transfer: Walk-in shower;Shower Dealer Details (indicate cue type and reason): pt educated with demo on shower transfer with wife  present. pt advised to enter backward   General ADL Comments: pt provided handout and reviewed in detail. pt reports ability to don LB clothing but when asked to demo attempting to reach for feet and again educated no bending. pt states "oh yeah" pt has AE from previous surgery adn aware of hte functional use for AE  Back handout provided and reviewed adls in detail. Pt educated on:  avoid sitting for long periods of time, correct bed positioning for sleeping, correct sequence for bed mobility, avoiding lifting more than 5 pounds and never [name] directly over incision.     Vision Baseline Vision/History: Wears glasses Wears Glasses: At all times       Perception     Praxis      Pertinent Vitals/Pain Pain Assessment: Faces Faces Pain Scale: Hurts a little bit Pain Location: back Pain Descriptors / Indicators: Sore     Hand Dominance Left   Extremity/Trunk Assessment Upper Extremity Assessment Upper Extremity Assessment: Generalized weakness   Lower Extremity Assessment Lower Extremity Assessment: Defer to PT evaluation   Cervical / Trunk Assessment Cervical / Trunk Assessment: Other exceptions(s/p back surg)   Communication Communication Communication: No difficulties   Cognition Arousal/Alertness: Awake/alert Behavior During Therapy: WFL for tasks assessed/performed Overall Cognitive Status:  Within Functional Limits for tasks assessed                                     General Comments  educated on showering and positioning    Exercises     Shoulder Instructions      Home Living Family/patient expects to be discharged to:: Private residence Living Arrangements: Spouse/significant other Available Help at Discharge: Family;Available 24 hours/day Type of Home: House Home Access: Stairs to enter Entergy CorporationEntrance Stairs-Number of Steps: 1   Home Layout: One level     Bathroom Shower/Tub: Producer, television/film/videoWalk-in shower   Bathroom Toilet: Handicapped height     Home  Equipment: Environmental consultantWalker - 2 wheels;Bedside commode;Wheelchair - manual          Prior Functioning/Environment Level of Independence: Needs assistance  Gait / Transfers Assistance Needed: amb with rolling walker. Distance limited              OT Problem List: Decreased strength;Decreased activity tolerance;Impaired balance (sitting and/or standing);Decreased safety awareness;Decreased knowledge of use of DME or AE;Decreased knowledge of precautions;Obesity;Pain      OT Treatment/Interventions: Self-care/ADL training;Therapeutic exercise;DME and/or AE instruction;Therapeutic activities;Balance training;Patient/family education    OT Goals(Current goals can be found in the care plan section) Acute Rehab OT Goals Patient Stated Goal: return home OT Goal Formulation: With patient Time For Goal Achievement: 12/02/17 Potential to Achieve Goals: Good  OT Frequency: Min 2X/week   Barriers to D/C:            Co-evaluation              AM-PAC PT "6 Clicks" Daily Activity     Outcome Measure Help from another person eating meals?: None Help from another person taking care of personal grooming?: A Little Help from another person toileting, which includes using toliet, bedpan, or urinal?: A Little Help from another person bathing (including washing, rinsing, drying)?: A Little Help from another person to put on and taking off regular upper body clothing?: A Little Help from another person to put on and taking off regular lower body clothing?: A Little 6 Click Score: 19   End of Session Equipment Utilized During Treatment: Gait belt;Rolling walker Nurse Communication: Mobility status;Precautions  Activity Tolerance: Patient tolerated treatment well Patient left: in chair;with call bell/phone within reach;with family/visitor present  OT Visit Diagnosis: Unsteadiness on feet (R26.81)                Time: 9147-82951316-1345 OT Time Calculation (min): 29 min Charges:  OT General Charges $OT  Visit: 1 Visit OT Evaluation $OT Eval Moderate Complexity: 1 Mod OT Treatments $Self Care/Home Management : 8-22 mins G-Codes:      [name] FlowJones, [name]   OTR/L Pager: [phone] Office: [phone] .   [name] [name], [name] [name] B 11/18/2017, 3:38 PM

## 2017-11-19 NOTE — Progress Notes (Addendum)
0700 Bedside shift report, pt sitting up in chair, A&Ox4, denies pain at time of shift change. Updated with POC, fall precautions in place, WCTM.   0900 Pt assessed, see flowsheet, and medicated see MAR. Dressing to back with drainage present, pt stated MD changed this AM. Wound care consult ordered for possible wound vac application. Wife at bedside, updated and all questions answered. Fall precautions in place, [facility]   1130 Pt still sitting up in chair, visiting with spouse at bedside. Denies pain, no complaints. Updated with POC. Fall precautions in place, [facility].  1245 RN received call from Pender[place]Dawn, [place] WOC. Awaiting dressing to be sent up from OR so can can place to pt's back. Will call Dawn when dressing arrives.   1415 Pt resting in bed, NAD, updated with status of wound dressing. Still awaiting dressing/wound vac from OR. No complaints, WCTM.   1600 Dawn, RN, WOC here to apply wound vac. Pt tolerated well. No complaints.   1700 Pt called RN to room, wound vac beeping, RN reinforced dressing to make air tight seal.   1915 Pt resting in bed, bedside shift report to Victorino DikeJennifer, Charity fundraiserN. Wound vac intact, no complaints.

## 2017-11-19 NOTE — Progress Notes (Signed)
Occupational Therapy Treatment Patient Details Name: [name] [name] MRN: [id number] DOB: Aug 07, 1956 Today's Date: 11/19/2017    History of present illness Pt s/p L2-3 LAMINECTOMY AND REDO LAMINECTOMIES  L3-4, L4-5 AND L5-S1. PMH - lumbar laminectomy, Obesity, OSA, ckd, arthritis   OT comments  Pt able to recall 3/3 back precautions. Able to perform bed mobility with min guard assist overall and cues for proper log roll technique. Pt required min assist for functional mobility with use of RW. Reviewed back and ADL education with pt and wife. Encouraged frequent mobility throughout the day upon return home; pt verbalized understanding. D/c plan remains appropriate. Will continue to follow acutely.   Follow Up Recommendations  No OT follow up    Equipment Recommendations  None recommended by OT    Recommendations for Other Services      Precautions / Restrictions Precautions Precautions: Back;Fall Precaution Booklet Issued: No Precaution Comments: Pt able to recall 3/3 back precautions Required Braces or Orthoses: ("No brace needed" orders) Restrictions Weight Bearing Restrictions: No       Mobility Bed Mobility Overal bed mobility: Needs Assistance Bed Mobility: Rolling;Sidelying to Sit;Sit to Sidelying Rolling: Supervision Sidelying to sit: Min guard     Sit to sidelying: Min guard General bed mobility comments: Cues throughout for log roll technique. Min guard for safety with bed mobililty. HOB flat with use of bed rails  Transfers Overall transfer level: Needs assistance Equipment used: Rolling walker (2 wheeled) Transfers: Sit to/from Stand Sit to Stand: Min guard         General transfer comment: Increased time and effort, cues for hand placement    Balance Overall balance assessment: Needs assistance Sitting-balance support: Feet supported;No upper extremity supported Sitting balance-Leahy Scale: Good     Standing balance support: Bilateral upper extremity  supported Standing balance-Leahy Scale: Poor Standing balance comment: RW for support                           ADL either performed or assessed with clinical judgement   ADL Overall ADL's : Needs assistance/impaired                         Toilet Transfer: Minimal assistance;Ambulation;RW Toilet Transfer Details (indicate cue type and reason): Simulated by sit to stand from chair with functional mobility         Functional mobility during ADLs: Minimal assistance;Rolling walker General ADL Comments: Educated pt on log roll technique for bed mobility, frequent mobility throughout the day upon return home, pillow between knees if in sidelying, pillow behind back if sitting in chair for posture     Vision       Perception     Praxis      Cognition Arousal/Alertness: Awake/alert Behavior During Therapy: WFL for tasks assessed/performed Overall Cognitive Status: Within Functional Limits for tasks assessed                                          Exercises     Shoulder Instructions       General Comments      Pertinent Vitals/ Pain       Pain Assessment: Faces Faces Pain Scale: Hurts a little bit Pain Location: back Pain Descriptors / Indicators: Sore Pain Intervention(s): Monitored during session  Home Living  Prior Functioning/Environment              Frequency  Min 2X/week        Progress Toward Goals  OT Goals(current goals can now be found in the care plan section)  Progress towards OT goals: Progressing toward goals  Acute Rehab OT Goals Patient Stated Goal: return home OT Goal Formulation: With patient  Plan Discharge plan remains appropriate    Co-evaluation                 AM-PAC PT "6 Clicks" Daily Activity     Outcome Measure   Help from another person eating meals?: None Help from another person taking care of personal  grooming?: A Little Help from another person toileting, which includes using toliet, bedpan, or urinal?: A Little Help from another person bathing (including washing, rinsing, drying)?: A Little Help from another person to put on and taking off regular upper body clothing?: A Little Help from another person to put on and taking off regular lower body clothing?: A Little 6 Click Score: 19    End of Session Equipment Utilized During Treatment: Rolling walker  OT Visit Diagnosis: Unsteadiness on feet (R26.81)   Activity Tolerance Patient tolerated treatment well   Patient Left in chair;with call bell/phone within reach;with family/visitor present   Nurse Communication          Time: 8295-62131042-1059 OT Time Calculation (min): 17 min  Charges: OT General Charges $OT Visit: 1 Visit OT Treatments $Therapeutic Activity: 8-22 mins  Courtenay Hirth A. Brett Albinooffey, M.S., OTR/L Pager: [phone]   Gaye AlkenBailey A Hakan Nudelman 11/19/2017, 11:07 AM

## 2017-11-19 NOTE — Consult Note (Addendum)
WOC Nurse wound consult note Pt is followed by neurosurgical team for assessment and plan of care. Reason for Consult: Requested to apply Prevena Vac dressing to back.  Discussed plan of care with Dr Otelia SergeantNitka and he provided the [facility] Increvena dressing supplies from the OR to the bedside. Wound type: Post-op incision to middle back; staples intact and well-approximated, small amt pink drainage leaking from incision area. Periwound: Intact skin surrounding Dressing procedure/placement/frequency: Applied Prevena dressing to portable machine and obtained good seal at 125mm cont suction.  Reviewed alarms and troubleshooting.  Prevena should remain in place up to 7 days for optimal plan of care.  Pt plans to discharge home tomorrow and should contact Neurosurgery team for further questions. Please re-consult if further assistance is needed.  Thank-you,  Cammie Mcgeeawn Brookie Wayment MSN, RN, CWOCN, LouisaWCN-AP, CNS [phone]

## 2017-11-19 NOTE — Progress Notes (Signed)
Placed patient on CPAP for the night via auto-mode with oxygen set at 2lpm  

## 2017-11-19 NOTE — Plan of Care (Signed)
  Education: Knowledge of General Education information will improve 11/19/2017 0532 - Progressing by Olena Materobinson, Kaylyne Axton G, RN Note POC reviewed with pt.

## 2017-11-19 NOTE — Progress Notes (Signed)
     Subjective:  1 Days Post-Op Patient reports pain as moderate.    Objective:   VITALS:  Temp:  [98.2 F (36.8 C)-101.5 F (38.6 C)] 98.4 F (36.9 C) (02/06 0647) Pulse Rate:  [75-94] 86 (02/06 0647) Resp:  [16-19] 18 (02/06 0647) BP: (104-156)/(48-83) 127/72 (02/06 0647) SpO2:  [94 %-100 %] 100 % (02/06 0647)  Neurologically intact ABD soft Neurovascular intact Sensation intact distally Intact pulses distally Dorsiflexion/Plantar flexion intact Incision: moderate drainage Compartment soft Dressing changed on commode but no leg numbness or weakness in legs    LABS Recent Labs    11/17/17 0625 11/18/17 0609  HGB 14.4 11.6*  WBC 4.0 7.4  PLT 220 192   Recent Labs    11/18/17 0609  NA 139  K 3.7  CL 104  CO2 24  BUN 25*  CREATININE 1.33*  GLUCOSE 108*   No results for input(s): LABPT, INR in the last 72 hours.   Assessment/Plan: 2 Days Post-Op Procedure(s) (LRB): L2-3 LAMINECTOMY AND REDO LAMINECTOMIES  L3-4, L4-5 AND L5-S1 (N/A)  Advance diet Up with therapy    Vira BrownsJames Eunice Winecoff 11/19/2017, 8:40 AM

## 2017-11-19 NOTE — Progress Notes (Signed)
PT Cancellation Note  Patient Details Name: Scot Junrnest Withington MRN: [id number] DOB: [birth year]   Cancelled Treatment:    Reason Eval/Treat Not Completed: Patient at procedure or test/unavailable Patient getting prevena vac placed. PT will check on pt later as time allows.    Derek MoundKellyn R Gordy Goar Rashawn Rayman, PTA Pager: [phone]   11/19/2017, 3:19 PM

## 2017-11-19 NOTE — Progress Notes (Signed)
Wound care VAC, Incisional prevene VAC placed in room. For  Placement.      Subjective: 2 Days Post-Op Procedure(s) (LRB): L2-3 LAMINECTOMY AND REDO LAMINECTOMIES  L3-4, L4-5 AND L5-S1 (N/A)  Patient reports pain as moderate.    Objective:   VITALS:  Temp:  [98.4 F (36.9 C)-101.5 F (38.6 C)] 99 F (37.2 C) (02/06 1100) Pulse Rate:  [75-94] 93 (02/06 1100) Resp:  [16-19] 18 (02/06 1100) BP: (104-156)/(48-79) 127/65 (02/06 1100) SpO2:  [96 %-100 %] 100 % (02/06 1100)  Neurologically intact ABD soft Neurovascular intact Sensation intact distally Intact pulses distally Dorsiflexion/Plantar flexion intact Incision: moderate drainage and weak left foot DF and left Knee extension c/w L4 rad chronic   LABS Recent Labs    11/17/17 0625 11/18/17 0609  HGB 14.4 11.6*  WBC 4.0 7.4  PLT 220 192   Recent Labs    11/18/17 0609  NA 139  K 3.7  CL 104  CO2 24  BUN 25*  CREATININE 1.33*  GLUCOSE 108*   No results for input(s): LABPT, INR in the last 72 hours.   Assessment/Plan: 2 Days Post-Op Procedure(s) (LRB): L2-3 LAMINECTOMY AND REDO LAMINECTOMIES  L3-4, L4-5 AND L5-S1 (N/A) Moderate drainage from incision, deep incision >40 BMI   Advance diet Up with therapy D/C IV fluids Will ask wound care team to place a prevene VAC. Vac brought from OR to room for use.  Vira BrownsJames Jacari Iannello 11/19/2017, 2:50 PM

## 2017-11-20 MED ORDER — OXYCODONE-ACETAMINOPHEN 7.5-325 MG PO TABS: 1.0000 | ORAL_TABLET | Freq: Four times a day (QID) | ORAL | 0 refills | Status: DC | PRN

## 2017-11-20 MED ORDER — MAGNESIUM CITRATE PO SOLN
0.5000 | Freq: Once | ORAL | Status: AC
  Administered 2017-11-20: 0.5 via ORAL
  Filled 2017-11-20: qty 296

## 2017-11-20 MED ORDER — METHOCARBAMOL 500 MG PO TABS: 500.0000 mg | ORAL_TABLET | Freq: Four times a day (QID) | ORAL | 0 refills | Status: DC | PRN

## 2017-11-20 MED FILL — Sodium Chloride IV Soln 0.9%: INTRAVENOUS | Qty: 1000 | Status: AC

## 2017-11-20 MED FILL — Heparin Sodium (Porcine) Inj 1000 Unit/ML: INTRAMUSCULAR | Qty: 30 | Status: AC

## 2017-11-20 MED FILL — Sodium Chloride Irrigation Soln 0.9%: Qty: 3000 | Status: AC

## 2017-11-20 NOTE — Progress Notes (Signed)
Patient is discharging with written and verbal instructions.  Home Health has been scheduled for follow up.  Patient is being discharged via wheel chair with his wife to the home.  Verbalizes understanding instructions and importance of follow up.

## 2017-11-20 NOTE — Progress Notes (Signed)
Physical Therapy Treatment Patient Details Name: [name] [name] MRN: [id number] DOB: [birth year] Today's Date: 11/20/2017    History of Present Illness Pt s/p L2-3 LAMINECTOMY AND REDO LAMINECTOMIES  L3-4, L4-5 AND L5-S1. PMH - lumbar laminectomy, Obesity, OSA, ckd, arthritis    PT Comments    This session focused on functional transfers and ambulation. Pt tolerated session well with pain centralized to incision site. Pt required min/mod A for sit to stand transfers (pt and wife reported this is an improvement from transfers PTA) and min guard/min A +2 for safe ambulation.Pt tolerated increased gait distance with less seated rest breaks. Pt does continue to demonstrate L knee instability at times. Continue to progress as tolerated with anticipated d/c to SNF for further skilled PT services.      Follow Up Recommendations  Home health PT;Supervision/Assistance - 24 hour     Equipment Recommendations  None recommended by PT    Recommendations for Other Services       Precautions / Restrictions Precautions Precautions: Back;Fall Precaution Booklet Issued: No Precaution Comments: Pt able to recall 3/3 back precautions Required Braces or Orthoses: ("No brace needed" orders) Restrictions Weight Bearing Restrictions: No    Mobility  Bed Mobility               General bed mobility comments: pt OOB in chair upon arrival  Transfers Overall transfer level: Needs assistance Equipment used: Rolling walker (2 wheeled) Transfers: Sit to/from Stand Sit to Stand: Min assist;Mod assist         General transfer comment: pt required varying assistance levels during session; cues for safe hand placement intially with carry over demonstrated; pt required less assist when using momentum to power up  Ambulation/Gait Ambulation/Gait assistance: Min assist;+2 safety/equipment;Min guard Ambulation Distance (Feet): (~163ft total with 2 seated rest breaks) Assistive device: Rolling walker (2  wheeled) Gait Pattern/deviations: Step-through pattern;Decreased step length - right;Decreased step length - left;Trunk flexed Gait velocity: decr   General Gait Details: +2 for chair follow; assist for balance at times as pt does continue to demonstrate L knee instability at times however less frequently than previous session   Stairs            Wheelchair Mobility    Modified Rankin (Stroke Patients Only)       Balance Overall balance assessment: Needs assistance Sitting-balance support: Feet supported;No upper extremity supported Sitting balance-Leahy Scale: Good     Standing balance support: Bilateral upper extremity supported Standing balance-Leahy Scale: Poor Standing balance comment: RW for support                            Cognition Arousal/Alertness: Awake/alert Behavior During Therapy: WFL for tasks assessed/performed Overall Cognitive Status: Within Functional Limits for tasks assessed                                        Exercises      General Comments        Pertinent Vitals/Pain Pain Assessment: Faces Faces Pain Scale: Hurts a little bit Pain Location: back Pain Descriptors / Indicators: Sore Pain Intervention(s): Limited activity within patient's tolerance;Monitored during session;Repositioned;Premedicated before session    Home Living                      Prior Function  PT Goals (current goals can now be found in the care plan section) Acute Rehab PT Goals Patient Stated Goal: return home PT Goal Formulation: With patient Time For Goal Achievement: 11/24/17 Potential to Achieve Goals: Good Progress towards PT goals: Progressing toward goals    Frequency    Min 5X/week      PT Plan Current plan remains appropriate    Co-evaluation              AM-PAC PT "6 Clicks" Daily Activity  Outcome Measure  Difficulty turning over in bed (including adjusting bedclothes, sheets  and blankets)?: A Lot Difficulty moving from lying on back to sitting on the side of the bed? : Unable Difficulty sitting down on and standing up from a chair with arms (e.g., wheelchair, bedside commode, etc,.)?: Unable Help needed moving to and from a bed to chair (including a wheelchair)?: A Little Help needed walking in hospital room?: A Lot Help needed climbing 3-5 steps with a railing? : Total 6 Click Score: 10    End of Session Equipment Utilized During Treatment: Gait belt Activity Tolerance: Patient tolerated treatment well Patient left: with call bell/phone within reach;with family/visitor present;in chair Nurse Communication: Mobility status PT Visit Diagnosis: Unsteadiness on feet (R26.81);Other abnormalities of gait and mobility (R26.89);Muscle weakness (generalized) (M62.81)     Time: 1130-1200 PT Time Calculation (min) (ACUTE ONLY): 30 min  Charges:  $Gait Training: 8-22 mins $Therapeutic Activity: 8-22 mins                    G Codes:       Erline LevineKellyn Maddisen Vought, PTA Pager: [phone]     Carolynne EdouardKellyn R Pj Zehner 11/20/2017, 12:16 PM

## 2017-11-20 NOTE — Progress Notes (Signed)
Subjective: Doing well.  Pain controlled.  preop leg pain gone.  small BM.  No voiding issues.    Objective: Vital signs in last 24 hours: Temp:  [99 F (37.2 C)-99.1 F (37.3 C)] 99 F (37.2 C) (02/07 0626) Pulse Rate:  [75-100] 75 (02/07 0626) Resp:  [18] 18 (02/07 0626) BP: (127-158)/(65-94) 158/94 (02/07 0626) SpO2:  [100 %] 100 % (02/07 0626)  Intake/Output from previous day: 02/06 0701 - 02/07 0700 In: 480 [P.O.:480] Out: -  Intake/Output this shift: No intake/output data recorded.  Recent Labs    11/18/17 0609  HGB 11.6*   Recent Labs    11/18/17 0609  WBC 7.4  RBC 4.15*  HCT 36.0*  PLT 192   Recent Labs    11/18/17 0609  NA 139  K 3.7  CL 104  CO2 24  BUN 25*  CREATININE 1.33*  GLUCOSE 108*  CALCIUM 8.6*   No results for input(s): LABPT, INR in the last 72 hours.  Exam: Very pleasant BM alert and oriented, NAD.  prevena wound vac on.  bilat calves nontender.   Left gastroc and ant tib weakness.   Assessment/Plan: Give mag citrate 1/2 bottle this morning.  Plan for D/C home this afternoon.  Scripts on chart for percocet and robaxin.  F/u with Dr Otelia SergeantNitka one week and we will remove wound vac at that time.     [name] [name] 11/20/2017, 8:59 AM

## 2017-11-21 ENCOUNTER — Telehealth (INDEPENDENT_AMBULATORY_CARE_PROVIDER_SITE_OTHER): Payer: Self-pay | Admitting: *Deleted

## 2017-11-21 NOTE — Telephone Encounter (Signed)
Jerilynn called from [facility] asking for verbal approval for PT 1 week 1, 3 week 1 and 2 week 1. CB # [phone]

## 2017-11-21 NOTE — Telephone Encounter (Signed)
I called and gave verbal auth to Lehman BrothersJerrilynn

## 2017-11-23 ENCOUNTER — Other Ambulatory Visit (INDEPENDENT_AMBULATORY_CARE_PROVIDER_SITE_OTHER): Payer: Self-pay | Admitting: Physical Medicine and Rehabilitation

## 2017-11-24 ENCOUNTER — Telehealth (INDEPENDENT_AMBULATORY_CARE_PROVIDER_SITE_OTHER): Payer: Self-pay | Admitting: Specialist

## 2017-11-24 NOTE — Telephone Encounter (Signed)
A physical therapist with [facility] (couldn't understand her name on voicemail) Wanted to let Dr. Otelia SergeantNitka know patient had a fall yesterday afternoon in his living room while walking with his walker, states he got weak and slowly went down and landed on his bottom. PT states he has no injuries, patient has follow up in office on Thursday. If you have any questions # [phone]

## 2017-11-24 NOTE — Telephone Encounter (Signed)
A physical therapist with [facility] (couldn't understand her name on voicemail) Wanted to let Dr. Otelia SergeantNitka know patient had a fall yesterday afternoon in his living room while walking with his walker, states he got weak and slowly went down and landed on his bottom. PT states he has no injuries, patient has follow up in office on Thursday

## 2017-11-24 NOTE — Telephone Encounter (Signed)
Please advise 

## 2017-11-24 NOTE — Telephone Encounter (Signed)
IF there is any concernwill see sooner but he is scheduled for an appointment in 3 days to remove the VAC.

## 2017-11-27 ENCOUNTER — Inpatient Hospital Stay (INDEPENDENT_AMBULATORY_CARE_PROVIDER_SITE_OTHER): Payer: No Typology Code available for payment source | Admitting: Surgery

## 2017-11-27 ENCOUNTER — Encounter (INDEPENDENT_AMBULATORY_CARE_PROVIDER_SITE_OTHER): Payer: Self-pay | Admitting: Specialist

## 2017-11-27 ENCOUNTER — Ambulatory Visit (INDEPENDENT_AMBULATORY_CARE_PROVIDER_SITE_OTHER): Payer: No Typology Code available for payment source | Admitting: Specialist

## 2017-11-27 VITALS — BP 116/61 | HR 82 | Ht 70.0 in | Wt 320.0 lb

## 2017-11-27 DIAGNOSIS — Z9889 Other specified postprocedural states: Secondary | ICD-10-CM

## 2017-11-27 DIAGNOSIS — M1712 Unilateral primary osteoarthritis, left knee: Secondary | ICD-10-CM

## 2017-11-27 MED ORDER — METHYLPREDNISOLONE ACETATE 40 MG/ML IJ SUSP
40.0000 mg | INTRAMUSCULAR | Status: AC | PRN
  Administered 2017-11-27: 40 mg via INTRA_ARTICULAR

## 2017-11-27 MED ORDER — BUPIVACAINE HCL 0.5 % IJ SOLN
3.0000 mL | INTRAMUSCULAR | Status: AC | PRN
  Administered 2017-11-27: 3 mL via INTRA_ARTICULAR

## 2017-11-27 NOTE — Progress Notes (Signed)
   Procedure Note  Patient: [name] [name]             Date of Birth: 1955-11-13           MRN: [id number]             Visit Date: 11/27/2017  Procedures: Visit Diagnoses: Unilateral primary osteoarthritis, left knee  S/P lumbar laminectomy  Large Joint Inj: L knee on 11/27/2017 4:08 PM Indications: pain Details: 25 G 1.5 in needle, anterolateral approach  Arthrogram: No  Medications: 40 mg methylPREDNISolone acetate 40 MG/ML; 3 mL bupivacaine 0.5 % Outcome: tolerated well, no immediate complications  bandaid applied. Procedure, treatment alternatives, risks and benefits explained, specific risks discussed. Consent was given by the patient. Immediately prior to procedure a time out was called to verify the correct patient, procedure, equipment, support staff and site/side marked as required. Patient was prepped and draped in the usual sterile fashion.

## 2017-11-27 NOTE — Progress Notes (Signed)
Post-Op Visit Note   Patient: [name] [name]           Date of Birth: February 07, 1956           MRN: [id number] Visit Date: 11/27/2017 PCP: Renford Dills, MD   Assessment & Plan: 10 days post lumbar laminectomy L2 to S1  Chief Complaint: Lumbar surgery with draining incision post op. Chief Complaint  Patient presents with  . Lower Back - Routine Post Op, Wound Check  10 days post op L2 to S1 redo lumbar laminectomies for recurrent stenosis, draining seroma post op a prevena wound VAC placed and he returns 1 week post VAC placement to have VAC removed.  Incision is dry and the VAC is removed stopped draining 2 days ago. Weak left foot DF 4/5 and left knee extension 5-/5. SLR negative. Complaints of left knee pain with history of OA. Requested injection in the hospital but not done will perform today. Visit Diagnoses: No diagnosis found.  Plan:    No lifting greater than 10 lbs. Avoid bending, stooping and twisting. Walk in house for first week them may start to get out slowly increasing distance up to one mile by 3 weeks post op. Keep incision dry for 3 days, may use tegaderm or similar water impervious dressing. Obtain AFO from [employer] for the right foot.  Knee is suffering from osteoarthritis, only real proven treatments are Weight loss, NSIADsc and exercise. Well padded shoes help. Ice the knee 2-3 times a day 15-20 mins at a time.   Follow-Up Instructions: No Follow-up on file.   Orders:  No orders of the defined types were placed in this encounter.  No orders of the defined types were placed in this encounter.   Imaging: No results found.  PMFS History: Patient Active Problem List   Diagnosis Date Noted  . Spinal stenosis of lumbar region 11/17/2017    Priority: High    Class: Chronic  . Status post lumbar laminectomy 11/17/2017  . Lumbar stenosis with neurogenic claudication 08/18/2017  . Forestier's disease of thoracolumbar region 08/18/2017  . Degenerative  disc disease, lumbar 08/18/2017  . Lumbar radiculopathy 09/30/2016  . Morbid (severe) obesity due to excess calories (HCC) 09/30/2016  . Biliary calculus with acute cholecystitis 03/21/2016  . Acute pyelonephritis 12/28/2013  . Ureteral stone with hydronephrosis 12/28/2013  . Acute kidney failure (HCC) 12/27/2013  . Severe sepsis with acute organ dysfunction (HCC) 12/27/2013  . Hypotension 12/27/2013  . Lactic acidosis 12/27/2013  . Sepsis secondary to UTI (HCC) 12/27/2013  . EMPYEMA CHEST 08/30/2008  . PRURIGO 08/30/2008  . OBSTRUCTIVE SLEEP APNEA 08/30/2008   Past Medical History:  Diagnosis Date  . Anemia   . Arthritis   . Chronic kidney disease    HX acute kidney failure / acute pyelonephritis / hydronephrosis / severe sepsis per discharge summary 12/27/13  . History of kidney stones   . Hypertension   . Morbid obesity (HCC)   . Obstructive sleep apnea    does not need c pap since 110 lb wt loss  . Osteoporosis   . Prurigo 2002  . Scars    ON ARMS FROM CHEMICAL EXPLOSION 1999  . Spinal stenosis     History reviewed. No pertinent family history.  Past Surgical History:  Procedure Laterality Date  . BACK SURGERY  01/23/2010   lumbar  . CHOLECYSTECTOMY N/A 03/24/2016   Procedure: LAPAROSCOPIC CHOLECYSTECTOMY WITH INTRAOPERATIVE CHOLANGIOGRAM;  Surgeon: Chevis Pretty III, MD;  Location: [facility];  Service: General;  Laterality: N/A;  . CIRCUMCISION    . CYSTOSCOPY WITH RETROGRADE PYELOGRAM, URETEROSCOPY AND STENT PLACEMENT Left 01/20/2014   Procedure: CYSTOSCOPY WITH RETROGRADE PYELOGRAM, URETEROSCOPY AND STENT EXCHANGE;  Surgeon: Milford Cageaniel Young Woodruff, MD;  Location: [facility];  Service: Urology;  Laterality: Left;  bugbee bladder fulguration  . CYSTOSCOPY WITH STENT PLACEMENT Left 12/28/2013   Procedure: CYSTOSCOPY WITH STENT PLACEMENT left retrograde pyleogram;  Surgeon: Milford Cageaniel Young Woodruff, MD;  Location: [facility];  Service: Urology;  Laterality: Left;  . ESOPHAGOGASTRODUODENOSCOPY  N/A 12/07/2012   Procedure: ESOPHAGOGASTRODUODENOSCOPY (EGD);  Surgeon: Lodema PilotBrian Layton, DO;  Location: [facility];  Service: General;  Laterality: N/A;  . HOLMIUM LASER APPLICATION Left 01/20/2014   Procedure: HOLMIUM LASER APPLICATION;  Surgeon: Milford Cageaniel Young Woodruff, MD;  Location: [facility];  Service: Urology;  Laterality: Left;  . LAPAROSCOPIC GASTRIC SLEEVE RESECTION N/A 12/07/2012   Procedure: LAPAROSCOPIC GASTRIC SLEEVE RESECTION;  Surgeon: Lodema PilotBrian Layton, DO;  Location: [facility];  Service: General;  Laterality: N/A;  laparoscopic sleeve gastrectomy with EGD  . LUMBAR LAMINECTOMY N/A 11/17/2017   Procedure: L2-3 LAMINECTOMY AND REDO LAMINECTOMIES  L3-4, L4-5 AND L5-S1;  Surgeon: Kerrin ChampagneNitka, James E, MD;  Location: [facility];  Service: Orthopedics;  Laterality: N/A;   Social History   Occupational History  . Not on file  Tobacco Use  . Smoking status: Former Smoker    Types: Cigarettes    Last attempt to quit: 04/09/1974    Years since quitting: 43.6  . Smokeless tobacco: Never Used  Substance and Sexual Activity  . Alcohol use: No  . Drug use: No  . Sexual activity: Not on file

## 2017-11-27 NOTE — Patient Instructions (Signed)
No lifting greater than 10 lbs. Avoid bending, stooping and twisting. Walk in house for first week them may start to get out slowly increasing distance up to one mile by 3 weeks post op. Keep incision dry for 3 days, may use tegaderm or similar water impervious dressing. Obtain AFO from [employer] for the right foot.  Knee is suffering from osteoarthritis, only real proven treatments are Weight loss, NSIADsc and exercise. Well padded shoes help. Ice the knee 2-3 times a day 15-20 mins at a time.

## 2017-12-03 ENCOUNTER — Ambulatory Visit (INDEPENDENT_AMBULATORY_CARE_PROVIDER_SITE_OTHER): Payer: No Typology Code available for payment source | Admitting: Physician Assistant

## 2017-12-04 ENCOUNTER — Encounter (INDEPENDENT_AMBULATORY_CARE_PROVIDER_SITE_OTHER): Payer: Self-pay | Admitting: Specialist

## 2017-12-04 ENCOUNTER — Ambulatory Visit (INDEPENDENT_AMBULATORY_CARE_PROVIDER_SITE_OTHER): Payer: No Typology Code available for payment source | Admitting: Surgery

## 2017-12-04 VITALS — BP 117/70 | HR 80 | Ht 70.0 in | Wt 320.0 lb

## 2017-12-04 DIAGNOSIS — Z9889 Other specified postprocedural states: Secondary | ICD-10-CM

## 2017-12-04 MED ORDER — METHOCARBAMOL 500 MG PO TABS: 500.0000 mg | ORAL_TABLET | Freq: Four times a day (QID) | ORAL | 0 refills | Status: DC | PRN

## 2017-12-04 MED ORDER — OXYCODONE-ACETAMINOPHEN 7.5-325 MG PO TABS: 1.0000 | ORAL_TABLET | Freq: Four times a day (QID) | ORAL | 0 refills | Status: DC | PRN

## 2017-12-04 NOTE — Progress Notes (Signed)
Patient comes in today for wound check.  States that he is doing well.  Has not had any drainage.  Needs refill of pain medication and muscle relaxer.  Exam Pleasant black male alert and oriented in no acute distress.  Wound is healing well.  No drainage or signs of infection.  Staples removed and Steri-Strips applied.  Patient will follow up with me in 1 week again for wound check to make sure that everything is still healing well.  He will hold off on showering.  Do not apply any creams or ointments to his incision.

## 2017-12-11 ENCOUNTER — Ambulatory Visit (INDEPENDENT_AMBULATORY_CARE_PROVIDER_SITE_OTHER): Payer: No Typology Code available for payment source | Admitting: Surgery

## 2017-12-11 ENCOUNTER — Encounter (INDEPENDENT_AMBULATORY_CARE_PROVIDER_SITE_OTHER): Payer: Self-pay | Admitting: Surgery

## 2017-12-11 VITALS — BP 117/77 | HR 86 | Ht 70.0 in | Wt 318.0 lb

## 2017-12-11 DIAGNOSIS — M48062 Spinal stenosis, lumbar region with neurogenic claudication: Secondary | ICD-10-CM

## 2017-12-11 NOTE — Progress Notes (Signed)
Patient returns today for wound check.  States that he is doing well.  Has not had any drainage from his back.  He is very pleased with his surgical result up to this point.  Exam Surgical incision is well-healed.  No signs of infection.  Plan Patient states that he has finished home health therapy.  He will continue to gradually increase his walking and activity over the next few weeks.  Follow-up with Dr. Otelia SergeantNitka in 4 weeks for recheck.  Return sooner if needed.

## 2017-12-12 NOTE — Discharge Summary (Signed)
Patient ID: [name] [name] MRN: [id number] DOB/AGE: [birth year] 62 y.o.  Admit date: 11/17/2017 Discharge date: 12/12/2017  Admission Diagnoses:  Principal Problem:   Spinal stenosis of lumbar region Active Problems:   Status post lumbar laminectomy   Discharge Diagnoses:  Principal Problem:   Spinal stenosis of lumbar region Active Problems:   Status post lumbar laminectomy  status post Procedure(s): L2-3 LAMINECTOMY AND REDO LAMINECTOMIES  L3-4, L4-5 AND L5-S1  Past Medical History:  Diagnosis Date  . Anemia   . Arthritis   . Chronic kidney disease    HX acute kidney failure / acute pyelonephritis / hydronephrosis / severe sepsis per discharge summary 12/27/13  . History of kidney stones   . Hypertension   . Morbid obesity (HCC)   . Obstructive sleep apnea    does not need c pap since 110 lb wt loss  . Osteoporosis   . Prurigo 2002  . Scars    ON ARMS FROM CHEMICAL EXPLOSION 1999  . Spinal stenosis     Surgeries: Procedure(s): L2-3 LAMINECTOMY AND REDO LAMINECTOMIES  L3-4, L4-5 AND L5-S1 on 11/17/2017   Consultants:   Discharged Condition: Improved  Hospital Course: [name] Junrnest Kurkowski is an 62 y.o. male who was admitted 11/17/2017 for operative treatment of Spinal stenosis of lumbar region. Patient failed conservative treatments (please see the history and physical for the specifics) and had severe unremitting pain that affects sleep, daily activities and work/hobbies. After pre-op clearance, the patient was taken to the operating room on 11/17/2017 and underwent  Procedure(s): L2-3 LAMINECTOMY AND REDO LAMINECTOMIES  L3-4, L4-5 AND L5-S1.    Patient was given perioperative antibiotics:  Anti-infectives (From admission, onward)   Start     Dose/Rate Route Frequency Ordered Stop   11/17/17 2000  vancomycin (VANCOCIN) IVPB 1000 mg/200 mL premix     1,000 mg 200 mL/hr over 60 Minutes Intravenous  Once 11/17/17 1522 11/18/17 0045   11/17/17 0845  clindamycin (CLEOCIN) 900 mg in  dextrose 5 % 100 mL IVPB     900 mg 200 mL/hr over 30 Minutes Intravenous To Surgery 11/17/17 0832 11/17/17 0906   11/17/17 0530  vancomycin (VANCOCIN) 1,500 mg in sodium chloride 0.9 % 500 mL IVPB     1,500 mg 250 mL/hr over 120 Minutes Intravenous To [facility]Stay Surgical 11/14/17 1117 11/17/17 0929       Patient was given sequential compression devices and early ambulation to prevent DVT.   Patient benefited maximally from hospital stay and there were no complications. At the time of discharge, the patient was urinating/moving their bowels without difficulty, tolerating a regular diet, pain is controlled with oral pain medications and they have been cleared by PT/OT.   Recent vital signs: No data found.   Recent laboratory studies: No results for input(s): WBC, HGB, HCT, PLT, NA, K, CL, CO2, BUN, CREATININE, GLUCOSE, INR, CALCIUM in the last 72 hours.  Invalid input(s): PT, 2   Discharge Medications:   Allergies as of 11/20/2017      Reactions   Unasyn [ampicillin-sulbactam Sodium] Hives, Itching, Swelling, Rash      Medication List    STOP taking these medications   acetaminophen 500 MG tablet Commonly known as:  TYLENOL   ibuprofen 200 MG tablet Commonly known as:  ADVIL,MOTRIN   traMADol-acetaminophen 37.5-325 MG tablet Commonly known as:  ULTRACET     TAKE these medications   calcium citrate-vitamin D 315-200 MG-UNIT tablet Commonly known as:  CITRACAL+D Take 1 tablet by mouth  daily.   hydrocortisone cream 1 % Apply 1 application topically 2 (two) times daily as needed for itching.   multivitamin with minerals Tabs tablet Take 1 tablet by mouth daily.   vitamin B-12 1000 MCG tablet Commonly known as:  CYANOCOBALAMIN Take 1,000 mcg by mouth daily.       Diagnostic Studies: Dg Lumbar Spine 1 View  Result Date: 11/17/2017 CLINICAL DATA:  Localization film EXAM: LUMBAR SPINE - 1 VIEW COMPARISON:  CT 09/09/2017 FINDINGS: Posterior surgical instruments are  directed at the S1 and L3 vertebral bodies. IMPRESSION: Intraoperative localization as above. Electronically Signed   By: Charlett Nose M.D.   On: 11/17/2017 16:59    Discharge Instructions    Call MD / Call 911   Complete by:  As directed    If you experience chest pain or shortness of breath, CALL 911 and be transported to the hospital emergency room.  If you develope a fever above 101 F, pus (white drainage) or increased drainage or redness at the wound, or calf pain, call your surgeon's office.   Constipation Prevention   Complete by:  As directed    Drink plenty of fluids.  Prune juice may be helpful.  You may use a stool softener, such as Colace (over the counter) 100 mg twice a day.  Use MiraLax (over the counter) for constipation as needed.   Diet - low sodium heart healthy   Complete by:  As directed    Discharge instructions   Complete by:  As directed    No lifting greater than 10 lbs. Avoid bending, stooping and twisting. Walk in house for first week them may start to get out slowly increasing distance up to one block by 3-4 weeks post op. Keep incision dry for 3 days, may use tegaderm or similar water impervious dressing. If there is concern about the [facility] dressing please call our office [phone]   Driving restrictions   Complete by:  As directed    No driving for 3 weeks   Increase activity slowly as tolerated   Complete by:  As directed    Lifting restrictions   Complete by:  As directed    No lifting for 8 weeks      Follow-up Information    Kerrin Champagne, MD Follow up in 1 week(s).   Specialty:  Orthopedic Surgery Why:  For wound re-check Contact information: [address] [place] [address] [phone]        [facility] Follow up.   Specialty:  Home Health Services Why:  A representative from [facility] will contact you to arrange start date and time for your therapy.  Contact information: [address] [place] [address] [phone]          Zonia Kief PA-C

## 2018-01-12 ENCOUNTER — Telehealth (INDEPENDENT_AMBULATORY_CARE_PROVIDER_SITE_OTHER): Payer: Self-pay | Admitting: Specialist

## 2018-01-12 NOTE — Telephone Encounter (Signed)
11/27/2017 OV Note faxed [employer] [phone]

## 2018-01-15 ENCOUNTER — Ambulatory Visit (INDEPENDENT_AMBULATORY_CARE_PROVIDER_SITE_OTHER): Payer: Self-pay

## 2018-01-15 ENCOUNTER — Encounter (INDEPENDENT_AMBULATORY_CARE_PROVIDER_SITE_OTHER): Payer: Self-pay | Admitting: Specialist

## 2018-01-15 ENCOUNTER — Ambulatory Visit (INDEPENDENT_AMBULATORY_CARE_PROVIDER_SITE_OTHER): Payer: No Typology Code available for payment source | Admitting: Specialist

## 2018-01-15 VITALS — BP 154/95 | HR 84 | Ht 70.0 in | Wt 318.0 lb

## 2018-01-15 DIAGNOSIS — M25562 Pain in left knee: Secondary | ICD-10-CM | POA: Diagnosis not present

## 2018-01-15 DIAGNOSIS — R29898 Other symptoms and signs involving the musculoskeletal system: Secondary | ICD-10-CM

## 2018-01-15 DIAGNOSIS — M1712 Unilateral primary osteoarthritis, left knee: Secondary | ICD-10-CM | POA: Diagnosis not present

## 2018-01-15 DIAGNOSIS — Z9889 Other specified postprocedural states: Secondary | ICD-10-CM

## 2018-01-15 DIAGNOSIS — G8929 Other chronic pain: Secondary | ICD-10-CM | POA: Diagnosis not present

## 2018-01-15 MED ORDER — TRAMADOL HCL 50 MG PO TABS: 100.0000 mg | ORAL_TABLET | Freq: Four times a day (QID) | ORAL | 0 refills | Status: DC | PRN

## 2018-01-15 NOTE — Progress Notes (Signed)
Office Visit Note   Patient: [name] [name]           Date of Birth: 1956/01/08           MRN: [id number] Visit Date: 01/15/2018              Requested by: Renford Dills, MD [address] [place], [place] [address] PCP: Renford Dills, MD   Assessment & Plan: Visit Diagnoses:  1. Status post lumbar laminectomy   2. Weakness of both lower extremities   3. Chronic pain of left knee   4. Unilateral primary osteoarthritis, left knee     Plan: Knee is suffering from osteoarthritis, only real proven treatments are Weight loss, NSIADs like meloxicam and exercise. Well padded shoes help. Ice the knee 2-3 times a day 15-20 mins at a time. Avoid frequent bending and stooping  No lifting greater than 10 lbs. May use ice or moist heat for pain. Weight loss is of benefit. Handicap license is approved. Start a therapy program directed at trying to improve his leg strength now that he has undergone lumbar decompressive surgery. He has a left AFO.  Would like to get him out of a wheel chair and using a walker and cane. Unfortunately the hip arthritis and knee arthritis he has may be the limiting factor, pool exercises is a cosideration and he may be able to continue at the [facility] or [facility] after starting a progressive ambulation program. He would not be a candidate for a TKR or THR unless he can effectively decrease his body mass index  Follow-Up Instructions: No follow-ups on file.   Orders:  Orders Placed This Encounter  Procedures  . XR Knee 1-2 Views Left   No orders of the defined types were placed in this encounter.     Procedures: Large Joint Inj: L knee on 01/15/2018 5:08 PM Indications: pain Details: 25 G 1.5 in needle, anterolateral approach  Arthrogram: No  Medications: 40 mg methylPREDNISolone acetate 40 MG/ML; 4 mL bupivacaine 0.25 % Outcome: tolerated well, no immediate complications  Bandaid applied.  Procedure, treatment alternatives, risks and benefits  explained, specific risks discussed. Consent was given by the patient. Immediately prior to procedure a time out was called to verify the correct patient, procedure, equipment, support staff and site/side marked as required. Patient was prepped and draped in the usual sterile fashion.       Clinical Data: No additional findings.   Subjective: Chief Complaint  Patient presents with  . Lower Back - Routine Post Op    62 year old male with history of lumbar laminectomy L3-4, L4-5 and L5-S1. He is still having weakness left leg and PT helped at home and recommended more PT on an outpatient basis. Complains of persisting left knee pain previous injection at his last visit helped.   Review of Systems  Constitutional: Negative.   HENT: Negative.   Eyes: Negative.   Respiratory: Negative.   Cardiovascular: Negative.   Gastrointestinal: Negative.   Endocrine: Negative.   Genitourinary: Negative.   Musculoskeletal: Negative.   Skin: Negative.   Allergic/Immunologic: Negative.   Neurological: Negative.   Hematological: Negative.   Psychiatric/Behavioral: Negative.      Objective: Vital Signs: BP (!) 154/95 (BP Location: Left Arm, Patient Position: Sitting)   Pulse 84   Ht 5\' 10"  (1.778 m)   Wt (!) 318 lb (144.2 kg)   BMI 45.63 kg/m   Physical Exam  Constitutional: He is oriented to person, place,  and time. He appears well-developed and well-nourished.  HENT:  Head: Normocephalic and atraumatic.  Eyes: Pupils are equal, round, and reactive to light. EOM are normal.  Neck: Normal range of motion. Neck supple.  Pulmonary/Chest: Effort normal and breath sounds normal.  Abdominal: Soft. Bowel sounds are normal.  Musculoskeletal:       Left knee: He exhibits effusion.  Neurological: He is alert and oriented to person, place, and time.  Skin: Skin is warm and dry.  Psychiatric: He has a normal mood and affect. His behavior is normal. Judgment and thought content normal.     Left Knee Exam   Muscle Strength  The patient has normal left knee strength.  Tenderness  The patient is experiencing tenderness in the medial joint line and patella.  Range of Motion  Extension:  -5 abnormal  Flexion: 110   Tests  McMurray:  Medial - negative Lateral - negative Varus: positive Valgus: negative Lachman:  Anterior - negative    Posterior - negative Drawer:  Anterior - negative     Posterior - negative Pivot shift: negative Patellar apprehension: positive  Other  Erythema: absent Scars: absent Sensation: normal Pulse: present Swelling: mild Effusion: effusion present   Back Exam   Tenderness  The patient is experiencing tenderness in the lumbar.  Range of Motion  Extension: normal  Flexion: normal   Muscle Strength  Right Quadriceps:  5/5  Left Quadriceps:  5/5  Right Hamstrings:  5/5  Left Hamstrings:  5/5   Reflexes  Patellar: normal Achilles: normal Babinski's sign: normal   Other  Toe walk: normal Heel walk: abnormal Sensation: decreased Gait: abnormal   Comments:  Left knee effusion tender medial and lateral left knee joint,       Specialty Comments:  No specialty comments available.  Imaging: No results found.   PMFS History: Patient Active Problem List   Diagnosis Date Noted  . Spinal stenosis of lumbar region 11/17/2017    Priority: High    Class: Chronic  . Status post lumbar laminectomy 11/17/2017  . Lumbar stenosis with neurogenic claudication 08/18/2017  . Forestier's disease of thoracolumbar region 08/18/2017  . Degenerative disc disease, lumbar 08/18/2017  . Lumbar radiculopathy 09/30/2016  . Morbid (severe) obesity due to excess calories (HCC) 09/30/2016  . Biliary calculus with acute cholecystitis 03/21/2016  . Acute pyelonephritis 12/28/2013  . Ureteral stone with hydronephrosis 12/28/2013  . Acute kidney failure (HCC) 12/27/2013  . Severe sepsis with acute organ dysfunction (HCC) 12/27/2013  .  Hypotension 12/27/2013  . Lactic acidosis 12/27/2013  . Sepsis secondary to UTI (HCC) 12/27/2013  . EMPYEMA CHEST 08/30/2008  . PRURIGO 08/30/2008  . OBSTRUCTIVE SLEEP APNEA 08/30/2008   Past Medical History:  Diagnosis Date  . Anemia   . Arthritis   . Chronic kidney disease    HX acute kidney failure / acute pyelonephritis / hydronephrosis / severe sepsis per discharge summary 12/27/13  . History of kidney stones   . Hypertension   . Morbid obesity (HCC)   . Obstructive sleep apnea    does not need c pap since 110 lb wt loss  . Osteoporosis   . Prurigo 2002  . Scars    ON ARMS FROM CHEMICAL EXPLOSION 1999  . Spinal stenosis     No family history on file.  Past Surgical History:  Procedure Laterality Date  . BACK SURGERY  01/23/2010   lumbar  . CHOLECYSTECTOMY N/A 03/24/2016   Procedure: LAPAROSCOPIC CHOLECYSTECTOMY WITH INTRAOPERATIVE CHOLANGIOGRAM;  Surgeon: Chevis PrettyPaul Toth III, MD;  Location: [facility];  Service: General;  Laterality: N/A;  . CIRCUMCISION    . CYSTOSCOPY WITH RETROGRADE PYELOGRAM, URETEROSCOPY AND STENT PLACEMENT Left 01/20/2014   Procedure: CYSTOSCOPY WITH RETROGRADE PYELOGRAM, URETEROSCOPY AND STENT EXCHANGE;  Surgeon: Milford Cageaniel Young Woodruff, MD;  Location: [facility];  Service: Urology;  Laterality: Left;  bugbee bladder fulguration  . CYSTOSCOPY WITH STENT PLACEMENT Left 12/28/2013   Procedure: CYSTOSCOPY WITH STENT PLACEMENT left retrograde pyleogram;  Surgeon: Milford Cageaniel Young Woodruff, MD;  Location: [facility];  Service: Urology;  Laterality: Left;  . ESOPHAGOGASTRODUODENOSCOPY N/A 12/07/2012   Procedure: ESOPHAGOGASTRODUODENOSCOPY (EGD);  Surgeon: Lodema PilotBrian Layton, DO;  Location: [facility];  Service: General;  Laterality: N/A;  . HOLMIUM LASER APPLICATION Left 01/20/2014   Procedure: HOLMIUM LASER APPLICATION;  Surgeon: Milford Cageaniel Young Woodruff, MD;  Location: [facility];  Service: Urology;  Laterality: Left;  . LAPAROSCOPIC GASTRIC SLEEVE RESECTION N/A 12/07/2012   Procedure: LAPAROSCOPIC  GASTRIC SLEEVE RESECTION;  Surgeon: Lodema PilotBrian Layton, DO;  Location: [facility];  Service: General;  Laterality: N/A;  laparoscopic sleeve gastrectomy with EGD  . LUMBAR LAMINECTOMY N/A 11/17/2017   Procedure: L2-3 LAMINECTOMY AND REDO LAMINECTOMIES  L3-4, L4-5 AND L5-S1;  Surgeon: Kerrin ChampagneNitka, James E, MD;  Location: [facility];  Service: Orthopedics;  Laterality: N/A;   Social History   Occupational History  . Not on file  Tobacco Use  . Smoking status: Former Smoker    Types: Cigarettes    Last attempt to quit: 04/09/1974    Years since quitting: 43.8  . Smokeless tobacco: Never Used  Substance and Sexual Activity  . Alcohol use: No  . Drug use: No  . Sexual activity: Not on file

## 2018-01-16 ENCOUNTER — Encounter (INDEPENDENT_AMBULATORY_CARE_PROVIDER_SITE_OTHER): Payer: Self-pay | Admitting: Specialist

## 2018-01-16 MED ORDER — METHYLPREDNISOLONE ACETATE 40 MG/ML IJ SUSP
40.0000 mg | INTRAMUSCULAR | Status: AC | PRN
  Administered 2018-01-15: 40 mg via INTRA_ARTICULAR

## 2018-01-16 MED ORDER — BUPIVACAINE HCL 0.25 % IJ SOLN
4.0000 mL | INTRAMUSCULAR | Status: AC | PRN
  Administered 2018-01-15: 4 mL via INTRA_ARTICULAR

## 2018-01-16 NOTE — Patient Instructions (Addendum)
  Knee is suffering from osteoarthritis, only real proven treatments are Weight loss, NSIADs like meloxicam and exercise. Well padded shoes help. Ice the knee 2-3 times a day 15-20 mins at a time. Avoid frequent bending and stooping  No lifting greater than 10 lbs. May use ice or moist heat for pain. Weight loss is of benefit. Handicap license is approved. Start a therapy program directed at trying to improve his leg strength now that he has undergone lumbar decompressive surgery. He has a left AFO.  Would like to get him out of a wheel chair and using a walker and cane. Unfortunately the hip arthritis and knee arthritis he has may be the limiting factor, pool exercises is a cosideration and he may be able to [facility] at [facility] or [facility] after starting a progressive ambulation program. He would not be a candidate for a TKR or THR unless he can effectively decrease his body mass index

## 2018-01-28 ENCOUNTER — Other Ambulatory Visit (INDEPENDENT_AMBULATORY_CARE_PROVIDER_SITE_OTHER): Payer: Self-pay | Admitting: Physical Medicine and Rehabilitation

## 2018-01-28 NOTE — Telephone Encounter (Signed)
He needs to talk with his PCP about prescribing this, it is not something to do everyday for a longtime without his PCP monitoring

## 2018-01-28 NOTE — Telephone Encounter (Signed)
Please advise 

## 2018-02-03 ENCOUNTER — Encounter: Payer: Self-pay | Admitting: Physical Therapy

## 2018-02-03 ENCOUNTER — Ambulatory Visit: Payer: No Typology Code available for payment source | Attending: Specialist | Admitting: Physical Therapy

## 2018-02-03 DIAGNOSIS — G8929 Other chronic pain: Secondary | ICD-10-CM | POA: Diagnosis present

## 2018-02-03 DIAGNOSIS — M545 Low back pain: Secondary | ICD-10-CM | POA: Insufficient documentation

## 2018-02-03 DIAGNOSIS — M6281 Muscle weakness (generalized): Secondary | ICD-10-CM | POA: Diagnosis present

## 2018-02-03 DIAGNOSIS — R262 Difficulty in walking, not elsewhere classified: Secondary | ICD-10-CM | POA: Insufficient documentation

## 2018-02-04 ENCOUNTER — Encounter: Payer: Self-pay | Admitting: Physical Therapy

## 2018-02-04 NOTE — Telephone Encounter (Signed)
Left message advising patient to follow up with his PCP regarding this prescription.

## 2018-02-04 NOTE — Addendum Note (Signed)
Addended by: Dessie ComaARROLL, Emmalynn Pinkham J on: 02/04/2018 02:01 PM   Modules accepted: Orders

## 2018-02-04 NOTE — Therapy (Addendum)
[facility] [address] [place], [place], [address] Phone: [phone]   Fax:  [phone]  Physical Therapy Evaluation  Patient Details  Name: [name] [name] MRN: [id number] Date of Birth: 04-May-1956 Referring Provider: Dr Vira Browns    Encounter Date: 02/03/2018  PT End of Session - 02/04/18 1046    Visit Number  1    Number of Visits  16    Date for PT Re-Evaluation  04/01/18    Authorization Type  [employer]     PT Start Time  1500    PT Stop Time  1544    PT Time Calculation (min)  44 min    Activity Tolerance  Patient tolerated treatment well    Behavior During Therapy  [facility] for tasks assessed/performed       Past Medical History:  Diagnosis Date  . Anemia   . Arthritis   . Chronic kidney disease    HX acute kidney failure / acute pyelonephritis / hydronephrosis / severe sepsis per discharge summary 12/27/13  . History of kidney stones   . Hypertension   . Morbid obesity (HCC)   . Obstructive sleep apnea    does not need c pap since 110 lb wt loss  . Osteoporosis   . Prurigo 2002  . Scars    ON ARMS FROM CHEMICAL EXPLOSION 1999  . Spinal stenosis     Past Surgical History:  Procedure Laterality Date  . BACK SURGERY  01/23/2010   lumbar  . CHOLECYSTECTOMY N/A 03/24/2016   Procedure: LAPAROSCOPIC CHOLECYSTECTOMY WITH INTRAOPERATIVE CHOLANGIOGRAM;  Surgeon: Chevis Pretty III, MD;  Location: [facility];  Service: General;  Laterality: N/A;  . CIRCUMCISION    . CYSTOSCOPY WITH RETROGRADE PYELOGRAM, URETEROSCOPY AND STENT PLACEMENT Left 01/20/2014   Procedure: CYSTOSCOPY WITH RETROGRADE PYELOGRAM, URETEROSCOPY AND STENT EXCHANGE;  Surgeon: Milford Cage, MD;  Location: [facility];  Service: Urology;  Laterality: Left;  bugbee bladder fulguration  . CYSTOSCOPY WITH STENT PLACEMENT Left 12/28/2013   Procedure: CYSTOSCOPY WITH STENT PLACEMENT left retrograde pyleogram;  Surgeon: Milford Cage, MD;  Location: [facility];   Service: Urology;  Laterality: Left;  . ESOPHAGOGASTRODUODENOSCOPY N/A 12/07/2012   Procedure: ESOPHAGOGASTRODUODENOSCOPY (EGD);  Surgeon: Lodema Pilot, DO;  [facility]: [facility];  Service: General;  Laterality: N/A;  . HOLMIUM LASER APPLICATION Left 01/20/2014   Procedure: HOLMIUM LASER APPLICATION;  Surgeon: Milford Cage, MD;  [facility];  Service: Urology;  Laterality: Left;  . LAPAROSCOPIC GASTRIC SLEEVE RESECTION N/A 12/07/2012   Procedure: LAPAROSCOPIC GASTRIC SLEEVE RESECTION;  Surgeon: Lodema Pilot, DO;  [facility]: [facility];  Service: General;  Laterality: N/A;  laparoscopic sleeve gastrectomy with EGD  . LUMBAR LAMINECTOMY N/A 11/17/2017   Procedure: L2-3 LAMINECTOMY AND REDO LAMINECTOMIES  L3-4, L4-5 AND L5-S1;  Surgeon: Kerrin Champagne, MD;  Location: [facility];  Service: Orthopedics;  Laterality: N/A;    There were no vitals filed for this visit.   Subjective Assessment - 02/03/18 1507    Subjective  Patient had low back surgery on 11/17/2017 to relive nerve root pressure that was effecting his right leg. He is currently using a wheelchair for primary mobility. He also has left knee pain. He has had three incedences recently where he flet the right leg gave out on him.  He has been having home health therapy which has helped.      Limitations  Standing;Walking    How long can you sit comfortably?  unable without significant UE  assist     How long can you stand comfortably?  Unable to maintain standing witout the walker 2-3 minutes with the walker     How long can you walk comfortably?  20-30 feet using the rolling walker     Currently in Pain?  Yes    Pain Score  4     Pain Location  Back    Pain Orientation  Left    Pain Descriptors / Indicators  Aching    Pain Type  Chronic pain;Surgical pain    Pain Onset  More than a month ago    Pain Frequency  Constant    Aggravating Factors   Standing and walking     Multiple Pain Sites  Yes    Pain Score  5    Pain Location  Back     Pain Orientation  Left    Pain Descriptors / Indicators  Aching    Pain Type  Chronic pain    Pain Onset  More than a month ago    Pain Frequency  Constant    Aggravating Factors   Nothing specific     Pain Relieving Factors  Pain medication     Effect of Pain on Daily Activities  Unable to walk distances. Using a walker.          [facility] PT Assessment - 02/04/18 0001      Assessment   Medical Diagnosis  Low Back Sx/ Left leg weakness    Referring Provider  Dr Vira Browns     Onset Date/Surgical Date  -- 11/17/2017 was surgery. Long standing low back pain     Hand Dominance  Left    Next MD Visit  -- end of May     Prior Therapy  10 visitxs of home health for his back       Precautions   Precautions  None      Restrictions   Weight Bearing Restrictions  No      Balance Screen   Has the patient fallen in the past 6 months  Yes    How many times?  3    Has the patient had a decrease in activity level because of a fear of falling?   Yes    Is the patient reluctant to leave their home because of a fear of falling?   No      Home Public house manager residence    Additional Comments  No steps in his house       Prior Function   Level of Independence  Independent    Vocation  On disability    Leisure  walking, going to the gym, cutting the grass       Cognition   Overall Cognitive Status  Within Functional Limits for tasks assessed    Attention  Focused    Focused Attention  Appears intact    Memory  Appears intact    Awareness  Appears intact    Problem Solving  Appears intact      Observation/Other Assessments   Observations  morbid obesity       Coordination   Gross Motor Movements are Fluid and Coordinated  Yes    Fine Motor Movements are Fluid and Coordinated  Yes      Posture/Postural Control   Posture Comments  rounded shoulders; forward head; flexed trunk in sitting and standing       AROM   Overall AROM Comments  lumbar active ROM not  measured because patient unable to maintain standing poisition without significant UE assist.       PROM   Overall PROM Comments  normal passive motion of bilateral hip pain with left internal rotation      Strength   Strength Assessment Site  Hip;Knee    Right/Left Hip  Right;Left    Right Hip Flexion  3+/5    Right Hip ABduction  3+/5    Right Hip ADduction  4/5    Left Hip Flexion  4+/5    Left Hip ABduction  4+/5    Left Hip ADduction  4+/5    Right/Left Knee  Right;Left    Right Knee Flexion  4/5    Right Knee Extension  3+/5    Left Knee Flexion  5/5    Left Knee Extension  5/5      Flexibility   Soft Tissue Assessment /Muscle Length  yes    Hamstrings  increased hamstring strength on the left       Palpation   Palpation comment  mild tendernes to palpation in the left lumbar psine       Special Tests   Other special tests  SLR (-) bilateral       Transfers   Comments  CGA for sit to stand transfer; cuing to back up to the tabel       Ambulation/Gait   Gait Comments  Puts significant weight into upper extremitys with ambualtion. Does not fully extensed bilateral lower extremitys with ambualtion.       High Level Balance   High Level Balance Comments  moderate assistance for narrow base of support; nothing else tested 2nd to high level assitance neeed.                 Objective measurements completed on examination: See above findings.      [facility] Adult PT Treatment/Exercise - 02/04/18 0001      Knee/Hip Exercises: Seated   Long Arc Quad Limitations  2x10 cuing to not make left knee hurt     Other Seated Knee/Hip Exercises  clamshell 2x10 yellow; ball squeeze 2x10     Other Seated Knee/Hip Exercises  2x10 bilateral              PT Education - 02/04/18 1044    Education provided  Yes    Education Details  reviewed symptom mangement; improtance of improving balcne;     Person(s) Educated  Patient    Methods  Explanation;Demonstration;Tactile  cues;Verbal cues    Comprehension  Verbalized understanding;Returned demonstration;Verbal cues required;Tactile cues required       PT Short Term Goals - 02/04/18 1053      PT SHORT TERM GOAL #1   Title  Patient will stand up straight in walker without cuing for 2 minutes    Time  4    Period  Weeks    Status  New    Target Date  03/04/18      PT SHORT TERM GOAL #2   Title  Patient will increase gross right leg strength to 4/5 `    Time  4    Status  New      PT SHORT TERM GOAL #3   Title  Patient will transfer sit to stand mod I     Time  4    Status  New    Target Date  03/04/18      PT SHORT TERM GOAL #  4   Title  Patient will be independent with inital HEP     Time  4    Period  Weeks    Status  New    Target Date  03/04/18        PT Long Term Goals - 02/04/18 1054      PT LONG TERM GOAL #1   Title  Patient will ambaulte 1000' without increased pain with LRAD     Time  8    Period  Weeks    Status  New    Target Date  04/01/18      PT LONG TERM GOAL #2   Title  Patient will be indepdnent with exercise program to promote leg strength and improve gait distance.     Time  8    Period  Weeks    Status  New    Target Date  04/01/18      PT LONG TERM GOAL #3   Title  Patient will demsotrate a 53% limitation on FOTO     Time  8    Period  Weeks    Status  New    Target Date  04/01/18             Plan - 02/04/18 1049    Clinical Impression Statement  Patient is a 62 year old male S/P L2-L3 Lumbar lamenectomy on 11/17/2017. He had significant right leg weakness prior to the surgery per patient. t this time he feels like his right leg weakness is improving. He continues to have weakness and he has baseline degeneration of his left knee. He has limited lumbar movement. He requireds guarding for balance and decreased ability to transfer without guarding. He would benefit from skilled therapy to improve over all balance and stability.     History and Personal  Factors relevant to plan of care:  morbid obesity, osteoperosis;     Clinical Presentation  Evolving    Clinical Presentation due to:  weakness and decreased balance effecting patients safety     Clinical Decision Making  Moderate    PT Frequency  2x / week    PT Duration  8 weeks    PT Treatment/Interventions  ADLs/Self Care Home Management;Electrical Stimulation;Cryotherapy;Iontophoresis 4mg /ml Dexamethasone;Ultrasound;DME Instruction;Gait training;Stair training;Therapeutic exercise;Therapeutic activities;Patient/family education;Manual techniques;Passive range of motion;Dry needling;Taping;Splinting    PT Next Visit Plan  gait training in the parellel bars; right lower extremity strengthening, core strengthening; consider supine march; standing weight shift.     PT Home Exercise Plan  seated clam shell; seated march ; LAQ; ball squeeze     Consulted and Agree with Plan of Care  Patient       Patient will benefit from skilled therapeutic intervention in order to improve the following deficits and impairments:  Pain, Decreased mobility, Decreased range of motion, Decreased endurance, Decreased activity tolerance, Decreased strength, Decreased safety awareness, Postural dysfunction  Visit Diagnosis: Difficulty in walking, not elsewhere classified - Plan: PT plan of care cert/re-cert  Muscle weakness (generalized) - Plan: PT plan of care cert/re-cert  Chronic bilateral low back pain without sciatica - Plan: PT plan of care cert/re-cert     Problem List Patient Active Problem List   Diagnosis Date Noted  . Spinal stenosis of lumbar region 11/17/2017    Class: Chronic  . Status post lumbar laminectomy 11/17/2017  . Lumbar stenosis with neurogenic claudication 08/18/2017  . Forestier's disease of thoracolumbar region 08/18/2017  . Degenerative disc disease, lumbar 08/18/2017  .  Lumbar radiculopathy 09/30/2016  . Morbid (severe) obesity due to excess calories (HCC) 09/30/2016  .  Biliary calculus with acute cholecystitis 03/21/2016  . Acute pyelonephritis 12/28/2013  . Ureteral stone with hydronephrosis 12/28/2013  . Acute kidney failure (HCC) 12/27/2013  . Severe sepsis with acute organ dysfunction (HCC) 12/27/2013  . Hypotension 12/27/2013  . Lactic acidosis 12/27/2013  . Sepsis secondary to UTI (HCC) 12/27/2013  . EMPYEMA CHEST 08/30/2008  . PRURIGO 08/30/2008  . OBSTRUCTIVE SLEEP APNEA 08/30/2008    Dessie Coma PT DPT  02/04/2018, 2:01 PM  [facility] [address] [place], [place], [address] Phone: [phone]   Fax:  [phone]  Name: Tee Richeson MRN: [id number] Date of Birth: [birth year]

## 2018-02-05 NOTE — Telephone Encounter (Signed)
11/27/2017 OV  Note refaxed to [employer] [phone]

## 2018-02-16 ENCOUNTER — Encounter (INDEPENDENT_AMBULATORY_CARE_PROVIDER_SITE_OTHER): Payer: Self-pay | Admitting: Specialist

## 2018-02-16 ENCOUNTER — Ambulatory Visit (INDEPENDENT_AMBULATORY_CARE_PROVIDER_SITE_OTHER): Payer: No Typology Code available for payment source | Admitting: Specialist

## 2018-02-16 ENCOUNTER — Ambulatory Visit: Payer: No Typology Code available for payment source | Attending: Specialist | Admitting: Physical Therapy

## 2018-02-16 ENCOUNTER — Telehealth (INDEPENDENT_AMBULATORY_CARE_PROVIDER_SITE_OTHER): Payer: Self-pay | Admitting: *Deleted

## 2018-02-16 ENCOUNTER — Encounter: Payer: Self-pay | Admitting: Physical Therapy

## 2018-02-16 VITALS — BP 143/83 | HR 82 | Ht 70.0 in | Wt 318.0 lb

## 2018-02-16 DIAGNOSIS — Z9889 Other specified postprocedural states: Secondary | ICD-10-CM | POA: Diagnosis not present

## 2018-02-16 DIAGNOSIS — M5416 Radiculopathy, lumbar region: Secondary | ICD-10-CM

## 2018-02-16 DIAGNOSIS — R262 Difficulty in walking, not elsewhere classified: Secondary | ICD-10-CM | POA: Diagnosis present

## 2018-02-16 DIAGNOSIS — M6281 Muscle weakness (generalized): Secondary | ICD-10-CM | POA: Diagnosis present

## 2018-02-16 DIAGNOSIS — M545 Low back pain: Secondary | ICD-10-CM | POA: Diagnosis present

## 2018-02-16 DIAGNOSIS — G8929 Other chronic pain: Secondary | ICD-10-CM | POA: Insufficient documentation

## 2018-02-16 MED ORDER — MELOXICAM 15 MG PO TABS: 15.0000 mg | ORAL_TABLET | Freq: Every day | ORAL | 4 refills | Status: DC

## 2018-02-16 NOTE — Patient Instructions (Signed)
Avoid frequent bending and stooping  No lifting greater than 10 lbs. May use ice or moist heat for pain. Weight loss is of benefit. Handicap license is approved.   

## 2018-02-16 NOTE — Therapy (Signed)
[facility] [address] [place], [place], [address] Phone: [phone]   Fax:  [phone]  Physical Therapy Treatment  Patient Details  Name: [name] [name] MRN: [id number] Date of Birth: 07-11-1956 Referring Provider: Dr Vira Browns    Encounter Date: 02/16/2018  PT End of Session - 02/16/18 1212    Visit Number  2    Number of Visits  16    Date for PT Re-Evaluation  04/01/18    Authorization Type  [employer]HC     PT Start Time  1145    PT Stop Time  1228    PT Time Calculation (min)  43 min    Activity Tolerance  Patient tolerated treatment well    Behavior During Therapy  [facility] for tasks assessed/performed       Past Medical History:  Diagnosis Date  . Anemia   . Arthritis   . Chronic kidney disease    HX acute kidney failure / acute pyelonephritis / hydronephrosis / severe sepsis per discharge summary 12/27/13  . History of kidney stones   . Hypertension   . Morbid obesity (HCC)   . Obstructive sleep apnea    does not need c pap since 110 lb wt loss  . Osteoporosis   . Prurigo 2002  . Scars    ON ARMS FROM CHEMICAL EXPLOSION 1999  . Spinal stenosis     Past Surgical History:  Procedure Laterality Date  . BACK SURGERY  01/23/2010   lumbar  . CHOLECYSTECTOMY N/A 03/24/2016   Procedure: LAPAROSCOPIC CHOLECYSTECTOMY WITH INTRAOPERATIVE CHOLANGIOGRAM;  Surgeon: Chevis Pretty III, MD;  [facility]: [facility];  Service: General;  Laterality: N/A;  . CIRCUMCISION    . CYSTOSCOPY WITH RETROGRADE PYELOGRAM, URETEROSCOPY AND STENT PLACEMENT Left 01/20/2014   Procedure: CYSTOSCOPY WITH RETROGRADE PYELOGRAM, URETEROSCOPY AND STENT EXCHANGE;  Surgeon: Milford Cage, MD;  [facility]: [facility];  Service: Urology;  Laterality: Left;  bugbee bladder fulguration  . CYSTOSCOPY WITH STENT PLACEMENT Left 12/28/2013   Procedure: CYSTOSCOPY WITH STENT PLACEMENT left retrograde pyleogram;  Surgeon: Milford Cage, MD;  [facility]: [facility];  Service:  Urology;  Laterality: Left;  . ESOPHAGOGASTRODUODENOSCOPY N/A 12/07/2012   Procedure: ESOPHAGOGASTRODUODENOSCOPY (EGD);  Surgeon: Lodema Pilot, DO;  [facility]: [facility];  Service: General;  Laterality: N/A;  . HOLMIUM LASER APPLICATION Left 01/20/2014   Procedure: HOLMIUM LASER APPLICATION;  Surgeon: Milford Cage, MD;  [facility]: [facility];  Service: Urology;  Laterality: Left;  . LAPAROSCOPIC GASTRIC SLEEVE RESECTION N/A 12/07/2012   Procedure: LAPAROSCOPIC GASTRIC SLEEVE RESECTION;  Surgeon: Lodema Pilot, DO;  [facility]: [facility];  Service: General;  Laterality: N/A;  laparoscopic sleeve gastrectomy with EGD  . LUMBAR LAMINECTOMY N/A 11/17/2017   Procedure: L2-3 LAMINECTOMY AND REDO LAMINECTOMIES  L3-4, L4-5 AND L5-S1;  Surgeon: Kerrin Champagne, MD;  Location: [facility];  Service: Orthopedics;  Laterality: N/A;    There were no vitals filed for this visit.  Subjective Assessment - 02/16/18 1156    Subjective  Patient comes in usingthe walker today. He has not been using the walker. He has been working on his exercises 2x per day.     Limitations  Standing;Walking    How long can you sit comfortably?  unable without significant UE assist     How long can you stand comfortably?  Unable to maintain standing witout the walker 2-3 minutes with the walker     How long can you walk comfortably?  20-30 feet using the  rolling walker     Currently in Pain?  No/denies                       [facility] Adult PT Treatment/Exercise - 02/16/18 0001      Exercises   Other Exercises   gait training in parallel bars w/ cues to correct standing posture x2      Lumbar Exercises: Supine   Ab Set  10 reps;5 seconds    Clam  20 reps;Limitations    Clam Limitations  red TB    Bent Knee Raise  20 reps w TrA cueing       Knee/Hip Exercises: Standing   Other Standing Knee Exercises  Stanging weight shift forward/backward 3x10 ; lateral 3x10      Knee/Hip Exercises: Seated   Long Arc Quad Limitations  2x10               PT Education - 02/16/18 1207    Education provided  Yes    Education Details  reviewed standing exercisesfor HEP     Person(s) Educated  Patient    Methods  Explanation;Demonstration;Tactile cues;Verbal cues    Comprehension  Verbalized understanding;Returned demonstration;Verbal cues required;Tactile cues required       PT Short Term Goals - 02/16/18 1214      PT SHORT TERM GOAL #1   Title  Patient will stand up straight in walker without cuing for 2 minutes    Baseline  fatigues in standing     Time  4    Period  Weeks    Status  On-going      PT SHORT TERM GOAL #2   Title  Patient will increase gross right leg strength to 4/5 `    Baseline  working on strengthening     Time  4    Period  Weeks    Status  On-going      PT SHORT TERM GOAL #3   Title  Patient will transfer sit to stand mod I     Baseline  wokring on sit to stand     Time  4    Period  Weeks    Status  On-going      PT SHORT TERM GOAL #4   Title  Patient will be independent with inital HEP     Baseline  working on basic HEP at home     Time  4    Period  Weeks    Status  On-going        PT Long Term Goals - 02/04/18 1054      PT LONG TERM GOAL #1   Title  Patient will ambaulte 1000' without increased pain with LRAD     Time  8    Period  Weeks    Status  New    Target Date  04/01/18      PT LONG TERM GOAL #2   Title  Patient will be indepdnent with exercise program to promote leg strength and improve gait distance.     Time  8    Period  Weeks    Status  New    Target Date  04/01/18      PT LONG TERM GOAL #3   Title  Patient will demsotrate a 53% limitation on FOTO     Time  8    Period  Weeks    Status  New    Target Date  04/01/18  Plan - 02/16/18 1213    Clinical Impression Statement  Patient was fatigued with standing exercises but was able to tolerate. Therapy gave him an updated HEP. He reports he has not had any ain since the last visit.  Therapy reviewed symptom mangment. Patient reported no pain at the end of treatment. Adendum: At the end of the treatment the patint had a witnessed fall. He reports he feels like he blacked out. He reported no pain or headache. His blood pressure was measured at 158/102. He was going to see dr Otelia Sergeant. Dr Rogelia Mire office was contacted.      Clinical Presentation  Evolving    Clinical Decision Making  Moderate    Rehab Potential  Good    PT Duration  8 weeks    PT Treatment/Interventions  ADLs/Self Care Home Management;Electrical Stimulation;Cryotherapy;Iontophoresis /ml Dexamethasone;Ultrasound;DME Instruction;Gait training;Stair training;Therapeutic exercise;Therapeutic activities;Patient/family education;Manual techniques;Passive range of motion;Dry needling;Taping;Splinting    PT Next Visit Plan  gait training in the parellel bars; right lower extremity strengthening, core strengthening; consider supine march; standing weight shift.     PT Home Exercise Plan  seated clam shell; seated march ; LAQ; ball squeeze     Consulted and Agree with Plan of Care  Patient       Patient will benefit from skilled therapeutic intervention in order to improve the following deficits and impairments:  Pain, Decreased mobility, Decreased range of motion, Decreased endurance, Decreased activity tolerance, Decreased strength, Decreased safety awareness, Postural dysfunction  Visit Diagnosis: Difficulty in walking, not elsewhere classified  Muscle weakness (generalized)  Chronic bilateral low back pain without sciatica     Problem List Patient Active Problem List   Diagnosis Date Noted  . Spinal stenosis of lumbar region 11/17/2017    Class: Chronic  . Status post lumbar laminectomy 11/17/2017  . Lumbar stenosis with neurogenic claudication 08/18/2017  . Forestier's disease of thoracolumbar region 08/18/2017  . Degenerative disc disease, lumbar 08/18/2017  . Lumbar radiculopathy 09/30/2016  . Morbid  (severe) obesity due to excess calories (HCC) 09/30/2016  . Biliary calculus with acute cholecystitis 03/21/2016  . Acute pyelonephritis 12/28/2013  . Ureteral stone with hydronephrosis 12/28/2013  . Acute kidney failure (HCC) 12/27/2013  . Severe sepsis with acute organ dysfunction (HCC) 12/27/2013  . Hypotension 12/27/2013  . Lactic acidosis 12/27/2013  . Sepsis secondary to UTI (HCC) 12/27/2013  . EMPYEMA CHEST 08/30/2008  . PRURIGO 08/30/2008  . OBSTRUCTIVE SLEEP APNEA 08/30/2008    Dessie Coma PT DPT  02/16/2018, 1:42 PM Elisabeth Pigeon SPT  02/16/2018   During this treatment session, the therapist was present, participating in and directing the treatment.  [facility] [address] [place], [place], [address] Phone: [phone]   Fax:  [phone]  Name: [name] [name] MRN: [id number] Date of Birth: 1956/06/23

## 2018-02-16 NOTE — Telephone Encounter (Signed)
Noted  

## 2018-02-16 NOTE — Progress Notes (Signed)
Office Visit Note   Patient: [name] [name]           Date of Birth: Oct 14, 1956           MRN: [id number] Visit Date: 02/16/2018              Requested by: Renford Dills, MD [address] [place], [place] [address] PCP: Renford Dills, MD   Assessment & Plan: Visit Diagnoses:  1. Status post lumbar laminectomy   2. Chronic left lumbar radiculopathy     Plan: Avoid frequent bending and stooping  No lifting greater than 10 lbs. May use ice or moist heat for pain. Weight loss is of benefit. Handicap license is approved.  Follow-Up Instructions: Return in about 6 weeks (around 03/30/2018).   Orders:  No orders of the defined types were placed in this encounter.  Meds ordered this encounter  Medications  . meloxicam (MOBIC) 15 MG tablet    Sig: Take 1 tablet (15 mg total) by mouth daily.    Dispense:  90 tablet    Refill:  4      Procedures: No procedures performed   Clinical Data: No additional findings.   Subjective: Chief Complaint  Patient presents with  . Lower Back - Follow-up    Per Onalee Hua pt had a fall today after his PT, he was checking out & had fallen backwards & hit his head really hard. Staff advised pt to go to ED but declined due to his appt w/Jemery Stacey today at 2pm, he denies H/A or back pain. They took Bp & was elevated.    62 year old male with history multiple level lumbar laminectomy 11/17/2017 L2-3, L3-4, L4-5 and L5-S1. He has history of previous lumbar laminectomy in 2012. He reports that he is Making progress in his standing and walking. He reports walking the parallel bars and he is cutting back on the use of the wheel chair. Using it as least as possible, uses it for cooking In the kitchen. He is doing the family cooking. He does his laundry. Has been going to PT 2 x per week, M and W. In the meantime he is on a home PT program.     Review of Systems  Constitutional: Positive for activity change and unexpected weight change.  HENT:  Negative for congestion, drooling, ear discharge, ear pain, facial swelling, hearing loss, mouth sores, postnasal drip, sinus pressure, sinus pain, sore throat, tinnitus and voice change.   Eyes: Negative.  Negative for photophobia, pain, discharge, redness, itching and visual disturbance.  Respiratory: Positive for shortness of breath. Negative for apnea, cough, choking, chest tightness and wheezing.   Cardiovascular: Negative.  Negative for chest pain, palpitations and leg swelling.  Gastrointestinal: Negative.  Negative for abdominal distention, abdominal pain, anal bleeding, blood in stool, constipation, diarrhea and nausea.  Endocrine: Negative.  Negative for cold intolerance, heat intolerance, polydipsia and polyphagia.  Genitourinary: Negative for discharge, dysuria, enuresis, flank pain, frequency, genital sores, hematuria and penile pain.  Musculoskeletal: Negative for arthralgias, back pain, gait problem, joint swelling, myalgias, neck pain and neck stiffness.  Skin: Negative.  Negative for color change, pallor, rash and wound.  Allergic/Immunologic: Negative.  Negative for environmental allergies, food allergies and immunocompromised state.  Neurological: Positive for dizziness, weakness, light-headedness and numbness. Negative for tremors, seizures, syncope, facial asymmetry, speech difficulty and headaches.  Hematological: Negative.  Negative for adenopathy. Does not bruise/bleed easily.  Psychiatric/Behavioral: Negative.  Negative for agitation, behavioral problems, confusion, decreased  concentration, dysphoric mood, hallucinations, self-injury, sleep disturbance and suicidal ideas. The patient is not nervous/anxious and is not hyperactive.      Objective: Vital Signs: BP (!) 143/83 (BP Location: Right Arm, Patient Position: Sitting)   Pulse 82   Ht  (1.778 m)   Wt (!) 318 lb (144.2 kg)   BMI 45.63 kg/m   Physical Exam  Constitutional: He is oriented to person, place, and  time. He appears well-developed and well-nourished.  HENT:  Head: Normocephalic and atraumatic.  Eyes: Pupils are equal, round, and reactive to light. EOM are normal.  Neck: Normal range of motion. Neck supple.  Pulmonary/Chest: Effort normal and breath sounds normal.  Abdominal: Soft. Bowel sounds are normal.  Neurological: He is alert and oriented to person, place, and time.  Skin: Skin is warm and dry.  Psychiatric: He has a normal mood and affect. His behavior is normal. Judgment and thought content normal.    Back Exam   Tenderness  The patient is experiencing tenderness in the lumbar.  Range of Motion  Extension: abnormal  Lateral bend right: normal  Rotation right: normal   Muscle Strength  Right Quadriceps:  5/5  Left Quadriceps:  5/5  Right Hamstrings:  5/5  Left Hamstrings:  5/5   Reflexes  Patellar:  Hyporeflexic abnormal Achilles: Hyporeflexic Babinski's sign: normal   Other  Toe walk: abnormal Heel walk: abnormal Gait: abnormal  Erythema: no back redness Scars: present  Comments:  Left foot DF 4/5, remainder is normal.      Specialty Comments:  No specialty comments available.  Imaging: No results found.   PMFS History: Patient Active Problem List   Diagnosis Date Noted  . Spinal stenosis of lumbar region 11/17/2017    Priority: High    Class: Chronic  . Status post lumbar laminectomy 11/17/2017  . Lumbar stenosis with neurogenic claudication 08/18/2017  . Forestier's disease of thoracolumbar region 08/18/2017  . Degenerative disc disease, lumbar 08/18/2017  . Lumbar radiculopathy 09/30/2016  . Morbid (severe) obesity due to excess calories (HCC) 09/30/2016  . Biliary calculus with acute cholecystitis 03/21/2016  . Acute pyelonephritis 12/28/2013  . Ureteral stone with hydronephrosis 12/28/2013  . Acute kidney failure (HCC) 12/27/2013  . Severe sepsis with acute organ dysfunction (HCC) 12/27/2013  . Hypotension 12/27/2013  . Lactic  acidosis 12/27/2013  . Sepsis secondary to UTI (HCC) 12/27/2013  . EMPYEMA CHEST 08/30/2008  . PRURIGO 08/30/2008  . OBSTRUCTIVE SLEEP APNEA 08/30/2008   Past Medical History:  Diagnosis Date  . Anemia   . Arthritis   . Chronic kidney disease    HX acute kidney failure / acute pyelonephritis / hydronephrosis / severe sepsis per discharge summary 12/27/13  . History of kidney stones   . Hypertension   . Morbid obesity (HCC)   . Obstructive sleep apnea    does not need c pap since 110 lb wt loss  . Osteoporosis   . Prurigo 2002  . Scars    ON ARMS FROM CHEMICAL EXPLOSION 1999  . Spinal stenosis     History reviewed. No pertinent family history.  Past Surgical History:  Procedure Laterality Date  . BACK SURGERY  01/23/2010   lumbar  . CHOLECYSTECTOMY N/A 03/24/2016   Procedure: LAPAROSCOPIC CHOLECYSTECTOMY WITH INTRAOPERATIVE CHOLANGIOGRAM;  Surgeon: Chevis Pretty III, MD;  Location: [facility];  Service: General;  Laterality: N/A;  . CIRCUMCISION    . CYSTOSCOPY WITH RETROGRADE PYELOGRAM, URETEROSCOPY AND STENT PLACEMENT Left 01/20/2014   Procedure: CYSTOSCOPY  WITH RETROGRADE PYELOGRAM, URETEROSCOPY AND STENT EXCHANGE;  Surgeon: Milford Cage, MD;  Location: [facility];  Service: Urology;  Laterality: Left;  bugbee bladder fulguration  . CYSTOSCOPY WITH STENT PLACEMENT Left 12/28/2013   Procedure: CYSTOSCOPY WITH STENT PLACEMENT left retrograde pyleogram;  Surgeon: Milford Cage, MD;  Location: [facility];  Service: Urology;  Laterality: Left;  . ESOPHAGOGASTRODUODENOSCOPY N/A 12/07/2012   Procedure: ESOPHAGOGASTRODUODENOSCOPY (EGD);  Surgeon: Lodema Pilot, DO;  Location: [facility];  Service: General;  Laterality: N/A;  . HOLMIUM LASER APPLICATION Left 01/20/2014   Procedure: HOLMIUM LASER APPLICATION;  Surgeon: Milford Cage, MD;  Location: [facility];  Service: Urology;  Laterality: Left;  . LAPAROSCOPIC GASTRIC SLEEVE RESECTION N/A 12/07/2012   Procedure: LAPAROSCOPIC GASTRIC SLEEVE  RESECTION;  Surgeon: Lodema Pilot, DO;  Location: [facility];  Service: General;  Laterality: N/A;  laparoscopic sleeve gastrectomy with EGD  . LUMBAR LAMINECTOMY N/A 11/17/2017   Procedure: L2-3 LAMINECTOMY AND REDO LAMINECTOMIES  L3-4, L4-5 AND L5-S1;  Surgeon: Kerrin Champagne, MD;  Location: [facility];  Service: Orthopedics;  Laterality: N/A;   Social History   Occupational History  . Not on file  Tobacco Use  . Smoking status: Former Smoker    Types: Cigarettes    Last attempt to quit: 04/09/1974    Years since quitting: 43.8  . Smokeless tobacco: Never Used  Substance and Sexual Activity  . Alcohol use: No  . Drug use: No  . Sexual activity: Not on file

## 2018-02-16 NOTE — Telephone Encounter (Signed)
Received vm on triage phone from Onalee Hua with [facility] outpt pharmacy informing us that pt had a fall today p his PT, he was checking out and had fallen backwards and hit his head really hard. Staff advised pt to go to ED but declined stating he has an appt with [place] today at 2pm, Onalee Hua said he denies H/A or back pain. They took pts' Bp and was elevated. Onalee Hua just wanted to let us know.

## 2018-02-18 ENCOUNTER — Ambulatory Visit: Payer: No Typology Code available for payment source | Admitting: Physical Therapy

## 2018-02-18 DIAGNOSIS — R262 Difficulty in walking, not elsewhere classified: Secondary | ICD-10-CM

## 2018-02-18 DIAGNOSIS — M6281 Muscle weakness (generalized): Secondary | ICD-10-CM

## 2018-02-18 DIAGNOSIS — G8929 Other chronic pain: Secondary | ICD-10-CM

## 2018-02-18 DIAGNOSIS — M545 Low back pain: Secondary | ICD-10-CM

## 2018-02-18 NOTE — Therapy (Signed)
[facility] [address] [place], [place], [address] Phone: [phone]   Fax:  [phone]  Physical Therapy Treatment  Patient Details  Name: [name] [name] MRN: [id number] Date of Birth: 06-14-1956 Referring Provider: Dr Vira Browns    Encounter Date: 02/18/2018  PT End of Session - 02/18/18 0852    Visit Number  3    Number of Visits  16    Date for PT Re-Evaluation  04/01/18    Authorization Type  [employer]     PT Start Time  0840    PT Stop Time  0922    PT Time Calculation (min)  42 min    Activity Tolerance  Patient tolerated treatment well    Behavior During Therapy  [facility] for tasks assessed/performed       Past Medical History:  Diagnosis Date  . Anemia   . Arthritis   . Chronic kidney disease    HX acute kidney failure / acute pyelonephritis / hydronephrosis / severe sepsis per discharge summary 12/27/13  . History of kidney stones   . Hypertension   . Morbid obesity (HCC)   . Obstructive sleep apnea    does not need c pap since 110 lb wt loss  . Osteoporosis   . Prurigo 2002  . Scars    ON ARMS FROM CHEMICAL EXPLOSION 1999  . Spinal stenosis     Past Surgical History:  Procedure Laterality Date  . BACK SURGERY  01/23/2010   lumbar  . CHOLECYSTECTOMY N/A 03/24/2016   Procedure: LAPAROSCOPIC CHOLECYSTECTOMY WITH INTRAOPERATIVE CHOLANGIOGRAM;  Surgeon: Chevis Pretty III, MD;  Location: [facility];  Service: General;  Laterality: N/A;  . CIRCUMCISION    . CYSTOSCOPY WITH RETROGRADE PYELOGRAM, URETEROSCOPY AND STENT PLACEMENT Left 01/20/2014   Procedure: CYSTOSCOPY WITH RETROGRADE PYELOGRAM, URETEROSCOPY AND STENT EXCHANGE;  Surgeon: Milford Cage, MD;  Location: [facility];  Service: Urology;  Laterality: Left;  bugbee bladder fulguration  . CYSTOSCOPY WITH STENT PLACEMENT Left 12/28/2013   Procedure: CYSTOSCOPY WITH STENT PLACEMENT left retrograde pyleogram;  Surgeon: Milford Cage, MD;  Location: [facility];  Service:  Urology;  Laterality: Left;  . ESOPHAGOGASTRODUODENOSCOPY N/A 12/07/2012   Procedure: ESOPHAGOGASTRODUODENOSCOPY (EGD);  Surgeon: Lodema Pilot, DO;  Location: [facility];  Service: General;  Laterality: N/A;  . HOLMIUM LASER APPLICATION Left 01/20/2014   Procedure: HOLMIUM LASER APPLICATION;  Surgeon: Milford Cage, MD;  Location: [facility];  Service: Urology;  Laterality: Left;  . LAPAROSCOPIC GASTRIC SLEEVE RESECTION N/A 12/07/2012   Procedure: LAPAROSCOPIC GASTRIC SLEEVE RESECTION;  Surgeon: Lodema Pilot, DO;  Location: [facility];  Service: General;  Laterality: N/A;  laparoscopic sleeve gastrectomy with EGD  . LUMBAR LAMINECTOMY N/A 11/17/2017   Procedure: L2-3 LAMINECTOMY AND REDO LAMINECTOMIES  L3-4, L4-5 AND L5-S1;  Surgeon: Kerrin Champagne, MD;  Location: [facility];  Service: Orthopedics;  Laterality: N/A;    There were no vitals filed for this visit.  Subjective Assessment - 02/18/18 0843    Subjective  Patient reported some knee pain after his fall. He has taken some ibuprofin an the knee improved. The back has been a little sore from time to time but is better now.     How long can you sit comfortably?  unable without significant UE assist     How long can you stand comfortably?  Unable to maintain standing witout the walker 2-3 minutes with the walker     How long can you walk comfortably?  20-30  feet using the rolling walker     Currently in Pain?  No/denies                       [facility] Adult PT Treatment/Exercise - 02/18/18 0001      Lumbar Exercises: Stretches   Active Hamstring Stretch  2 reps;20 seconds      Lumbar Exercises: Supine   Clam  20 reps;Limitations    Clam Limitations  green TB    Bent Knee Raise  20 reps w TrA cueing     Other Supine Lumbar Exercises  supine ball squeeze 2x10       Knee/Hip Exercises: Standing   Other Standing Knee Exercises  Stanging weight shift forward/backward 3x10 ; lateral 3x10      Knee/Hip Exercises: Seated   Other Seated  Knee/Hip Exercises  side to side weight shift 2x10; Forward and back x7 with a pop in the knee.              PT Education - 02/18/18 0851    Education provided  Yes    Education Details  reviewed postrual correction exercises     Person(s) Educated  Patient    Methods  Explanation;Demonstration;Tactile cues;Verbal cues    Comprehension  Verbalized understanding;Returned demonstration;Verbal cues required;Tactile cues required;Need further instruction       PT Short Term Goals - 02/16/18 1214      PT SHORT TERM GOAL #1   Title  Patient will stand up straight in walker without cuing for 2 minutes    Baseline  fatigues in standing     Time  4    Period  Weeks    Status  On-going      PT SHORT TERM GOAL #2   Title  Patient will increase gross right leg strength to 4/5 `    Baseline  working on strengthening     Time  4    Period  Weeks    Status  On-going      PT SHORT TERM GOAL #3   Title  Patient will transfer sit to stand mod I     Baseline  wokring on sit to stand     Time  4    Period  Weeks    Status  On-going      PT SHORT TERM GOAL #4   Title  Patient will be independent with inital HEP     Baseline  working on basic HEP at home     Time  4    Period  Weeks    Status  On-going        PT Long Term Goals - 02/04/18 1054      PT LONG TERM GOAL #1   Title  Patient will ambaulte 1000' without increased pain with LRAD     Time  8    Period  Weeks    Status  New    Target Date  04/01/18      PT LONG TERM GOAL #2   Title  Patient will be indepdnent with exercise program to promote leg strength and improve gait distance.     Time  8    Period  Weeks    Status  New    Target Date  04/01/18      PT LONG TERM GOAL #3   Title  Patient will demsotrate a 53% limitation on FOTO     Time  8    Period  Weeks    Status  New    Target Date  04/01/18            Plan - 02/18/18 0857    Clinical Impression Statement  Patient tolerated treatment better  today. He was less fatigued with exercises. Therapy gave hoim postural exercises for his HEP. He required mod cuing for technique with ther-ex. Therapy avoided exercises with excessive knee movement. He did feel a pop and some pain with standing weight shift but the pain went away Edwards. No syncope or excessive sweating today.     Clinical Presentation  Evolving    Clinical Decision Making  Moderate    Rehab Potential  Good    PT Frequency  2x / week    PT Duration  8 weeks    PT Treatment/Interventions  ADLs/Self Care Home Management;Electrical Stimulation;Cryotherapy;Iontophoresis /ml Dexamethasone;Ultrasound;DME Instruction;Gait training;Stair training;Therapeutic exercise;Therapeutic activities;Patient/family education;Manual techniques;Passive range of motion;Dry needling;Taping;Splinting    PT Next Visit Plan  gait training in the parellel bars; right lower extremity strengthening, core strengthening; consider supine march; standing weight shift.     PT Home Exercise Plan  seated clam shell; seated march ; LAQ; ball squeeze     Consulted and Agree with Plan of Care  Patient       Patient will benefit from skilled therapeutic intervention in order to improve the following deficits and impairments:  Pain, Decreased mobility, Decreased range of motion, Decreased endurance, Decreased activity tolerance, Decreased strength, Decreased safety awareness, Postural dysfunction  Visit Diagnosis: Difficulty in walking, not elsewhere classified  Muscle weakness (generalized)  Chronic bilateral low back pain without sciatica     Problem List Patient Active Problem List   Diagnosis Date Noted  . Spinal stenosis of lumbar region 11/17/2017    Class: Chronic  . Status post lumbar laminectomy 11/17/2017  . Lumbar stenosis with neurogenic claudication 08/18/2017  . Forestier's disease of thoracolumbar region 08/18/2017  . Degenerative disc disease, lumbar 08/18/2017  . Lumbar radiculopathy  09/30/2016  . Morbid (severe) obesity due to excess calories (HCC) 09/30/2016  . Biliary calculus with acute cholecystitis 03/21/2016  . Acute pyelonephritis 12/28/2013  . Ureteral stone with hydronephrosis 12/28/2013  . Acute kidney failure (HCC) 12/27/2013  . Severe sepsis with acute organ dysfunction (HCC) 12/27/2013  . Hypotension 12/27/2013  . Lactic acidosis 12/27/2013  . Sepsis secondary to UTI (HCC) 12/27/2013  . EMPYEMA CHEST 08/30/2008  . PRURIGO 08/30/2008  . OBSTRUCTIVE SLEEP APNEA 08/30/2008    Dessie Coma  PT DPT  02/18/2018, 11:23 AM   Elisabeth Pigeon SPT 02/18/2018   During this treatment session, the therapist was present, participating in and directing the treatment.   [facility] [address] [place], [place], [address] Phone: [phone]   Fax:  [phone]  Name: Vincenzo Stave MRN: [id number] Date of Birth: 07-Jan-1956

## 2018-02-21 ENCOUNTER — Other Ambulatory Visit (INDEPENDENT_AMBULATORY_CARE_PROVIDER_SITE_OTHER): Payer: Self-pay | Admitting: Physical Medicine and Rehabilitation

## 2018-02-23 ENCOUNTER — Ambulatory Visit: Payer: No Typology Code available for payment source | Admitting: Physical Therapy

## 2018-02-23 NOTE — Telephone Encounter (Signed)
Please advise 

## 2018-02-25 ENCOUNTER — Encounter: Payer: Self-pay | Admitting: Physical Therapy

## 2018-02-25 ENCOUNTER — Ambulatory Visit: Payer: No Typology Code available for payment source | Admitting: Physical Therapy

## 2018-02-25 DIAGNOSIS — R262 Difficulty in walking, not elsewhere classified: Secondary | ICD-10-CM

## 2018-02-25 DIAGNOSIS — M6281 Muscle weakness (generalized): Secondary | ICD-10-CM

## 2018-02-25 DIAGNOSIS — G8929 Other chronic pain: Secondary | ICD-10-CM

## 2018-02-25 DIAGNOSIS — M545 Low back pain: Secondary | ICD-10-CM

## 2018-02-26 NOTE — Therapy (Signed)
[facility]patient [facility] [address] [place], [place], [address] Phone: [phone]   Fax:  [phone]  Physical Therapy Treatment  Patient Details  Name: [name] [name] MRN: [id number] Date of Birth: [birth year] Referring Provider: Dr Vira Browns    Encounter Date: 02/25/2018  PT End of Session - 02/25/18 1510    Visit Number  4    Number of Visits  16    Date for PT Re-Evaluation  04/01/18    Authorization Type  [employer]     PT Start Time  0300    PT Stop Time  0344    PT Time Calculation (min)  44 min    Activity Tolerance  Patient tolerated treatment well    Behavior During Therapy  [facility] for tasks assessed/performed       Past Medical History:  Diagnosis Date  . Anemia   . Arthritis   . Chronic kidney disease    HX acute kidney failure / acute pyelonephritis / hydronephrosis / severe sepsis per discharge summary 12/27/13  . History of kidney stones   . Hypertension   . Morbid obesity (HCC)   . Obstructive sleep apnea    does not need c pap since 110 lb wt loss  . Osteoporosis   . Prurigo 2002  . Scars    ON ARMS FROM CHEMICAL EXPLOSION 1999  . Spinal stenosis     Past Surgical History:  Procedure Laterality Date  . BACK SURGERY  01/23/2010   lumbar  . CHOLECYSTECTOMY N/A 03/24/2016   Procedure: LAPAROSCOPIC CHOLECYSTECTOMY WITH INTRAOPERATIVE CHOLANGIOGRAM;  Surgeon: Chevis Pretty III, MD;  [facility];  Service: General;  Laterality: N/A;  . CIRCUMCISION    . CYSTOSCOPY WITH RETROGRADE PYELOGRAM, URETEROSCOPY AND STENT PLACEMENT Left 01/20/2014   Procedure: CYSTOSCOPY WITH RETROGRADE PYELOGRAM, URETEROSCOPY AND STENT EXCHANGE;  Surgeon: Milford Cage, MD;  [facility];  Service: Urology;  Laterality: Left;  bugbee bladder fulguration  . CYSTOSCOPY WITH STENT PLACEMENT Left 12/28/2013   Procedure: CYSTOSCOPY WITH STENT PLACEMENT left retrograde pyleogram;  Surgeon: Milford Cage, MD;  [facility];  Service:  Urology;  Laterality: Left;  . ESOPHAGOGASTRODUODENOSCOPY N/A 12/07/2012   Procedure: ESOPHAGOGASTRODUODENOSCOPY (EGD);  Surgeon: Lodema Pilot, DO;  [facility];  Service: General;  Laterality: N/A;  . HOLMIUM LASER APPLICATION Left 01/20/2014   Procedure: HOLMIUM LASER APPLICATION;  Surgeon: Milford Cage, MD;  [facility];  Service: Urology;  Laterality: Left;  . LAPAROSCOPIC GASTRIC SLEEVE RESECTION N/A 12/07/2012   Procedure: LAPAROSCOPIC GASTRIC SLEEVE RESECTION;  Surgeon: Lodema Pilot, DO;  [facility];  Service: General;  Laterality: N/A;  laparoscopic sleeve gastrectomy with EGD  . LUMBAR LAMINECTOMY N/A 11/17/2017   Procedure: L2-3 LAMINECTOMY AND REDO LAMINECTOMIES  L3-4, L4-5 AND L5-S1;  Surgeon: Kerrin Champagne, MD;  [facility];  Service: Orthopedics;  Laterality: N/A;    There were no vitals filed for this visit.  Subjective Assessment - 02/25/18 1504    Subjective  Patient reports his back has been a little sore. 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 His pain is on the right side.     How long can you sit comfortably?  unable without significant UE assist     How long can you stand comfortably?  Unable to maintain standing witout the walker 2-3 minutes with the walker     How long can you walk comfortably?  20-30 feet using the  rolling walker     Currently in Pain?  Yes    Pain Score  4     Pain Location  Back    Pain Orientation  Right    Pain Descriptors / Indicators  Aching    Pain Type  Chronic pain    Pain Onset  More than a month ago    Pain Frequency  Constant    Aggravating Factors   standing and walking                        [facility] Adult PT Treatment/Exercise - 02/26/18 0001      Lumbar Exercises: Stretches   Active Hamstring Stretch  2 reps;20 seconds    Lower Trunk Rotation  Limitations    Lower Trunk Rotation Limitations  2x10 with cuing to keep hips flat. Patient had difficulty controlling his  right LE       Lumbar Exercises: Aerobic   Nustep  5 min seated       Lumbar Exercises: Supine   Ab Set  10 reps;5 seconds    Clam  20 reps;Limitations    Clam Limitations  green TB    Bent Knee Raise  20 reps w TrA cueing     Other Supine Lumbar Exercises  supine ball squeeze 2x10       Knee/Hip Exercises: Standing   Other Standing Knee Exercises  Stanging weight shift forward/backward 3x10 ; lateral 3x10    Other Standing Knee Exercises  tennis ball release       Knee/Hip Exercises: Seated   Long Arc Quad Limitations  2x10              PT Education - 02/25/18 1509    Education provided  Yes    Education Details  symptom management     Person(s) Educated  Patient    Methods  Explanation;Demonstration;Tactile cues;Verbal cues    Comprehension  Verbalized understanding;Verbal cues required;Tactile cues required;Returned demonstration       PT Short Term Goals - 02/26/18 1043      PT SHORT TERM GOAL #1   Title  Patient will stand up straight in walker without cuing for 2 minutes    Baseline  fatigues in standing     Period  Weeks    Status  On-going      PT SHORT TERM GOAL #2   Title  Patient will increase gross right leg strength to 4/5 `    Baseline  working on strengthening     Time  4    Period  Weeks    Status  On-going      PT SHORT TERM GOAL #3   Title  Patient will transfer sit to stand mod I     Baseline  wokring on sit to stand     Time  4    Period  Weeks    Status  On-going      PT SHORT TERM GOAL #4   Title  Patient will be independent with inital HEP     Baseline  indepdnent with intial HEP     Time  4    Period  Weeks    Status  Achieved        PT Long Term Goals - 02/04/18 1054      PT LONG TERM GOAL #1   Title  Patient will ambaulte 1000' without increased pain with LRAD     Time  8  Period  Weeks    Status  New    Target Date  04/01/18      PT LONG TERM GOAL #2   Title  Patient will be indepdnent with exercise program to  promote leg strength and improve gait distance.     Time  8    Period  Weeks    Status  New    Target Date  04/01/18      PT LONG TERM GOAL #3   Title  Patient will demsotrate a 53% limitation on FOTO     Time  8    Period  Weeks    Status  New    Target Date  04/01/18            Plan - 02/26/18 1038    Clinical Impression Statement  Therapy added the new step to POC, He had no increase in pain. patient was also given a tennis ball for trigger point release. He reported nopain after treament. Therapy hopes to add in some tanding hip exercises when able.     Clinical Presentation  Evolving    Clinical Decision Making  Moderate    Rehab Potential  Good    PT Frequency  2x / week    PT Duration  8 weeks    PT Treatment/Interventions  ADLs/Self Care Home Management;Electrical Stimulation;Cryotherapy;Iontophoresis /ml Dexamethasone;Ultrasound;DME Instruction;Gait training;Stair training;Therapeutic exercise;Therapeutic activities;Patient/family education;Manual techniques;Passive range of motion;Dry needling;Taping;Splinting    PT Next Visit Plan  gait training in the parellel bars; right lower extremity strengthening, core strengthening; consider supine march; standing weight shift.     PT Home Exercise Plan  seated clam shell; seated march ; LAQ; ball squeeze     Consulted and Agree with Plan of Care  Patient       Patient will benefit from skilled therapeutic intervention in order to improve the following deficits and impairments:  Pain, Decreased mobility, Decreased range of motion, Decreased endurance, Decreased activity tolerance, Decreased strength, Decreased safety awareness, Postural dysfunction  Visit Diagnosis: Difficulty in walking, not elsewhere classified  Muscle weakness (generalized)  Chronic bilateral low back pain without sciatica     Problem List Patient Active Problem List   Diagnosis Date Noted  . Spinal stenosis of lumbar region 11/17/2017     Class: Chronic  . Status post lumbar laminectomy 11/17/2017  . Lumbar stenosis with neurogenic claudication 08/18/2017  . Forestier's disease of thoracolumbar region 08/18/2017  . Degenerative disc disease, lumbar 08/18/2017  . Lumbar radiculopathy 09/30/2016  . Morbid (severe) obesity due to excess calories (HCC) 09/30/2016  . Biliary calculus with acute cholecystitis 03/21/2016  . Acute pyelonephritis 12/28/2013  . Ureteral stone with hydronephrosis 12/28/2013  . Acute kidney failure (HCC) 12/27/2013  . Severe sepsis with acute organ dysfunction (HCC) 12/27/2013  . Hypotension 12/27/2013  . Lactic acidosis 12/27/2013  . Sepsis secondary to UTI (HCC) 12/27/2013  . EMPYEMA CHEST 08/30/2008  . PRURIGO 08/30/2008  . OBSTRUCTIVE SLEEP APNEA 08/30/2008    Dessie Coma  PT DPT  02/26/2018, 12:31 PM  [facility] [address] [place], [place], [address] Phone: [phone]   Fax:  [phone]  Name: [name] [name] MRN: [id number] Date of Birth: 01-10-1956

## 2018-03-02 ENCOUNTER — Ambulatory Visit: Payer: No Typology Code available for payment source | Admitting: Physical Therapy

## 2018-03-02 ENCOUNTER — Encounter: Payer: Self-pay | Admitting: Physical Therapy

## 2018-03-02 DIAGNOSIS — R262 Difficulty in walking, not elsewhere classified: Secondary | ICD-10-CM

## 2018-03-02 DIAGNOSIS — M545 Low back pain: Secondary | ICD-10-CM

## 2018-03-02 DIAGNOSIS — M6281 Muscle weakness (generalized): Secondary | ICD-10-CM

## 2018-03-02 DIAGNOSIS — G8929 Other chronic pain: Secondary | ICD-10-CM

## 2018-03-02 NOTE — Therapy (Signed)
[facility] Outpatient Rehabilitation [facility] [address] [place], [place], [address] Phone: [phone]   Fax:  [phone]  Physical Therapy Treatment  Patient Details  Name: [name] [name] MRN: [id number] Date of Birth: Jun 10, 1956 Referring Provider: Dr Vira Browns    Encounter Date: 03/02/2018  PT End of Session - 03/02/18 1508    Visit Number  5    Number of Visits  16    Date for PT Re-Evaluation  04/01/18    Authorization Type  UHC     PT Start Time  0300    PT Stop Time  0342    PT Time Calculation (min)  42 min    Activity Tolerance  Patient tolerated treatment well    Behavior During Therapy  [facility] for tasks assessed/performed       Past Medical History:  Diagnosis Date  . Anemia   . Arthritis   . Chronic kidney disease    HX acute kidney failure / acute pyelonephritis / hydronephrosis / severe sepsis per discharge summary 12/27/13  . History of kidney stones   . Hypertension   . Morbid obesity (HCC)   . Obstructive sleep apnea    does not need c pap since 110 lb wt loss  . Osteoporosis   . Prurigo 2002  . Scars    ON ARMS FROM CHEMICAL EXPLOSION 1999  . Spinal stenosis     Past Surgical History:  Procedure Laterality Date  . BACK SURGERY  01/23/2010   lumbar  . CHOLECYSTECTOMY N/A 03/24/2016   Procedure: LAPAROSCOPIC CHOLECYSTECTOMY WITH INTRAOPERATIVE CHOLANGIOGRAM;  Surgeon: Chevis Pretty III, MD;  [facility];  Service: General;  Laterality: N/A;  . CIRCUMCISION    . CYSTOSCOPY WITH RETROGRADE PYELOGRAM, URETEROSCOPY AND STENT PLACEMENT Left 01/20/2014   Procedure: CYSTOSCOPY WITH RETROGRADE PYELOGRAM, URETEROSCOPY AND STENT EXCHANGE;  Surgeon: Milford Cage, MD;  [facility];  Service: Urology;  Laterality: Left;  bugbee bladder fulguration  . CYSTOSCOPY WITH STENT PLACEMENT Left 12/28/2013   Procedure: CYSTOSCOPY WITH STENT PLACEMENT left retrograde pyleogram;  Surgeon: Milford Cage, MD;  [facility];  Service:  Urology;  Laterality: Left;  . ESOPHAGOGASTRODUODENOSCOPY N/A 12/07/2012   Procedure: ESOPHAGOGASTRODUODENOSCOPY (EGD);  Surgeon: Lodema Pilot, DO;  [facility];  Service: General;  Laterality: N/A;  . HOLMIUM LASER APPLICATION Left 01/20/2014   Procedure: HOLMIUM LASER APPLICATION;  Surgeon: Milford Cage, MD;  [facility];  Service: Urology;  Laterality: Left;  . LAPAROSCOPIC GASTRIC SLEEVE RESECTION N/A 12/07/2012   Procedure: LAPAROSCOPIC GASTRIC SLEEVE RESECTION;  Surgeon: Lodema Pilot, DO;  [facility];  Service: General;  Laterality: N/A;  laparoscopic sleeve gastrectomy with EGD  . LUMBAR LAMINECTOMY N/A 11/17/2017   Procedure: L2-3 LAMINECTOMY AND REDO LAMINECTOMIES  L3-4, L4-5 AND L5-S1;  Surgeon: Kerrin Champagne, MD;  [facility];  Service: Orthopedics;  Laterality: N/A;    There were no vitals filed for this visit.  Subjective Assessment - 03/02/18 1505    Subjective  Patient reports no significant back pain at this time. He was able to complete exercises over the past few days.                        [facility] Adult PT Treatment/Exercise - 03/02/18 0001      Lumbar Exercises: Stretches   Active Hamstring Stretch  2 reps;20 seconds    Lower Trunk Rotation  Limitations    Lower Trunk Rotation Limitations  2x10 with cuing to keep hips flat. Patient had difficulty controlling his right LE       Lumbar Exercises: Aerobic   Nustep  5 min seated       Lumbar Exercises: Supine   Ab Set  10 reps;5 seconds    Clam  20 reps;Limitations    Clam Limitations  green TB    Bent Knee Raise  20 reps w TrA cueing     Other Supine Lumbar Exercises  supine ball squeeze 2x10       Knee/Hip Exercises: Standing   Other Standing Knee Exercises  Stanging weight shift forward/backward 3x10 ; lateral 3x10 Standing march     Other Standing Knee Exercises  tennis ball release       Knee/Hip Exercises: Seated   Long Arc Quad Limitations  2x10                 PT Short Term Goals - 02/26/18 1043      PT SHORT TERM GOAL #1   Title  Patient will stand up straight in walker without cuing for 2 minutes    Baseline  fatigues in standing     Period  Weeks    Status  On-going      PT SHORT TERM GOAL #2   Title  Patient will increase gross right leg strength to 4/5 `    Baseline  working on strengthening     Time  4    Period  Weeks    Status  On-going      PT SHORT TERM GOAL #3   Title  Patient will transfer sit to stand mod I     Baseline  wokring on sit to stand     Time  4    Period  Weeks    Status  On-going      PT SHORT TERM GOAL #4   Title  Patient will be independent with inital HEP     Baseline  indepdnent with intial HEP     Time  4    Period  Weeks    Status  Achieved        PT Long Term Goals - 02/04/18 1054      PT LONG TERM GOAL #1   Title  Patient will ambaulte 1000' without increased pain with LRAD     Time  8    Period  Weeks    Status  New    Target Date  04/01/18      PT LONG TERM GOAL #2   Title  Patient will be indepdnent with exercise program to promote leg strength and improve gait distance.     Time  8    Period  Weeks    Status  New    Target Date  04/01/18      PT LONG TERM GOAL #3   Title  Patient will demsotrate a 53% limitation on FOTO     Time  8    Period  Weeks    Status  New    Target Date  04/01/18            Plan - 03/02/18 1524    Clinical Impression Statement  Patient tolerated ther-ex well. Therapy added the nu-step back in. He reported no increase in pain. He has been working on his standing exercises at home. Continue to progress standing exercises at tolerated.     Clinical Presentation  Evolving    Clinical Decision Making  Moderate    Rehab Potential  Good    PT Frequency  2x / week    PT Duration  8 weeks    PT Treatment/Interventions  ADLs/Self Care Home Management;Electrical Stimulation;Cryotherapy;Iontophoresis /ml  Dexamethasone;Ultrasound;DME Instruction;Gait training;Stair training;Therapeutic exercise;Therapeutic activities;Patient/family education;Manual techniques;Passive range of motion;Dry needling;Taping;Splinting    PT Next Visit Plan  gait training in the parellel bars; right lower extremity strengthening, core strengthening; consider supine march; standing weight shift.     PT Home Exercise Plan  seated clam shell; seated march ; LAQ; ball squeeze     Consulted and Agree with Plan of Care  Patient       Patient will benefit from skilled therapeutic intervention in order to improve the following deficits and impairments:  Pain, Decreased mobility, Decreased range of motion, Decreased endurance, Decreased activity tolerance, Decreased strength, Decreased safety awareness, Postural dysfunction  Visit Diagnosis: Difficulty in walking, not elsewhere classified  Muscle weakness (generalized)  Chronic bilateral low back pain without sciatica     Problem List Patient Active Problem List   Diagnosis Date Noted  . Spinal stenosis of lumbar region 11/17/2017    Class: Chronic  . Status post lumbar laminectomy 11/17/2017  . Lumbar stenosis with neurogenic claudication 08/18/2017  . Forestier's disease of thoracolumbar region 08/18/2017  . Degenerative disc disease, lumbar 08/18/2017  . Lumbar radiculopathy 09/30/2016  . Morbid (severe) obesity due to excess calories (HCC) 09/30/2016  . Biliary calculus with acute cholecystitis 03/21/2016  . Acute pyelonephritis 12/28/2013  . Ureteral stone with hydronephrosis 12/28/2013  . Acute kidney failure (HCC) 12/27/2013  . Severe sepsis with acute organ dysfunction (HCC) 12/27/2013  . Hypotension 12/27/2013  . Lactic acidosis 12/27/2013  . Sepsis secondary to UTI (HCC) 12/27/2013  . EMPYEMA CHEST 08/30/2008  . PRURIGO 08/30/2008  . OBSTRUCTIVE SLEEP APNEA 08/30/2008    Dessie Coma PT DPT  03/02/2018, 4:32 PM  [facility] [address] [place], [place], [address] Phone: [phone]   Fax:  [phone]  Name: Anita Mcadory MRN: [id number] Date of Birth: 07-20-1956

## 2018-03-04 ENCOUNTER — Ambulatory Visit: Payer: No Typology Code available for payment source | Admitting: Physical Therapy

## 2018-03-04 ENCOUNTER — Encounter: Payer: Self-pay | Admitting: Physical Therapy

## 2018-03-04 DIAGNOSIS — M545 Low back pain, unspecified: Secondary | ICD-10-CM

## 2018-03-04 DIAGNOSIS — R262 Difficulty in walking, not elsewhere classified: Secondary | ICD-10-CM | POA: Diagnosis not present

## 2018-03-04 DIAGNOSIS — G8929 Other chronic pain: Secondary | ICD-10-CM

## 2018-03-04 DIAGNOSIS — M6281 Muscle weakness (generalized): Secondary | ICD-10-CM

## 2018-03-04 NOTE — Therapy (Signed)
[place], [place], [address] Phone: [phone]   Fax:  [phone]  Physical Therapy Treatment  Patient Details  Name: Ramin Zoll MRN: [id number] Date of Birth: Dec 30, 1955 Referring Provider: Dr Basil Dess    Encounter Date: 03/04/2018  PT End of Session - 03/04/18 1652    Visit Number  6    Number of Visits  16    Date for PT Re-Evaluation  04/01/18    PT Start Time  1502    PT Stop Time  1545    PT Time Calculation (min)  43 min    Activity Tolerance  Patient tolerated treatment well    Behavior During Therapy  [facility] for tasks assessed/performed       Past Medical History:  Diagnosis Date  . Anemia   . Arthritis   . Chronic kidney disease    HX acute kidney failure / acute pyelonephritis / hydronephrosis / severe sepsis per discharge summary 12/27/13  . History of kidney stones   . Hypertension   . Morbid obesity ([place])   . Obstructive sleep apnea    does not need c pap since 110 lb wt loss  . Osteoporosis   . Prurigo 2002  . Scars    ON ARMS FROM CHEMICAL EXPLOSION 1999  . Spinal stenosis     Past Surgical History:  Procedure Laterality Date  . BACK SURGERY  01/23/2010   lumbar  . CHOLECYSTECTOMY N/A 03/24/2016   Procedure: LAPAROSCOPIC CHOLECYSTECTOMY WITH INTRAOPERATIVE CHOLANGIOGRAM;  Surgeon: Autumn Messing III, MD;  [facility];  Service: General;  Laterality: N/A;  . CIRCUMCISION    . CYSTOSCOPY WITH RETROGRADE PYELOGRAM, URETEROSCOPY AND STENT PLACEMENT Left 01/20/2014   Procedure: CYSTOSCOPY WITH RETROGRADE PYELOGRAM, URETEROSCOPY AND STENT EXCHANGE;  Surgeon: Molli Hazard, MD;  [facility];  Service: Urology;  Laterality: Left;  bugbee bladder fulguration  . CYSTOSCOPY WITH STENT PLACEMENT Left 12/28/2013   Procedure: CYSTOSCOPY WITH STENT PLACEMENT left retrograde pyleogram;  Surgeon: Molli Hazard, MD;  [facility];  Service: Urology;  Laterality: Left;   . ESOPHAGOGASTRODUODENOSCOPY N/A 12/07/2012   Procedure: ESOPHAGOGASTRODUODENOSCOPY (EGD);  Surgeon: Madilyn Hook, DO;  [facility];  Service: General;  Laterality: N/A;  . HOLMIUM LASER APPLICATION Left 0/11/4095   Procedure: HOLMIUM LASER APPLICATION;  Surgeon: Molli Hazard, MD;  [facility];  Service: Urology;  Laterality: Left;  . LAPAROSCOPIC GASTRIC SLEEVE RESECTION N/A 12/07/2012   Procedure: LAPAROSCOPIC GASTRIC SLEEVE RESECTION;  Surgeon: Madilyn Hook, DO;  [facility];  Service: General;  Laterality: N/A;  laparoscopic sleeve gastrectomy with EGD  . LUMBAR LAMINECTOMY N/A 11/17/2017   Procedure: L2-3 LAMINECTOMY AND REDO LAMINECTOMIES  L3-4, L4-5 AND L5-S1;  Surgeon: Jessy Oto, MD;  Location: [place];  Service: Orthopedics;  Laterality: N/A;    There were no vitals filed for this visit.  Subjective Assessment - 03/04/18 1506    Subjective  No pain meds this week.  last wwas ibuprophen on Sunday.  Left knee is stronger.  i don't have to think about it collapsing every step.  I feel more comfortable moving and walking    Currently in Pain?  No/denies    Pain Location  Back                       [facility] Adult PT Treatment/Exercise - 03/04/18 0001      Ambulation/Gait   Gait Comments  in  parallel barswalking knees bent,  with purposeful weight shifts and ankle,kneeglut activation and YOGA walking.  Posture with YOGA walking improved demonstrating improved use of muscles.        Lumbar Exercises: Stretches   Gastroc Stretch  3 reps;30 seconds incline board      Lumbar Exercises: Supine   Ab Set  10 reps with breaths    Bent Knee Raise  10 reps    Bridge  10 reps legs on bolster    Other Supine Lumbar Exercises  abdominal isometrics  10 x both and single hand presses      Lumbar Exercises: Sidelying   Clam  10 reps both.  min assist with hip positioning.      Knee/Hip Exercises: Seated   Marching  10 reps    Hamstring Curl  10 reps;2 sets     Hamstring Limitations  blue band red band      Ankle Exercises: Seated   Other Seated Ankle Exercises  AROM   IV/EV 10 X each manual for tibia,  decreased ROM/ STRENgth noted,  DF blue band 10 x each             PT Education - 03/04/18 1651    Education provided  Yes    Education Details  gait    Person(s) Educated  Patient    Methods  Explanation;Demonstration;Verbal cues    Comprehension  Verbalized understanding;Returned demonstration       PT Short Term Goals - 03/04/18 1654      PT SHORT TERM GOAL #1   Baseline  able to do  at end of session.  not timed    Time  4    Period  Weeks    Status  Partially Met      PT SHORT TERM GOAL #2   Title  Patient will increase gross right leg strength to 4/5 `    Baseline  working on strengthening ,   feet IV/EV  3/5    Time  4    Period  Weeks    Status  On-going      PT SHORT TERM GOAL #3   Title  Patient will transfer sit to stand mod I     Baseline  able to do     Time  4    Period  Weeks    Status  Achieved      PT SHORT TERM GOAL #4   Title  Patient will be independent with inital HEP     Baseline  indepdnent with intial HEP     Time  4    Period  Weeks    Status  Achieved        PT Long Term Goals - 02/04/18 1054      PT LONG TERM GOAL #1   Title  Patient will ambaulte 1000' without increased pain with LRAD     Time  8    Period  Weeks    Status  New    Target Date  04/01/18      PT LONG TERM GOAL #2   Title  Patient will be indepdnent with exercise program to promote leg strength and improve gait distance.     Time  8    Period  Weeks    Status  New    Target Date  04/01/18      PT LONG TERM GOAL #3   Title  Patient will demsotrate a 53% limitation on FOTO  Time  8    Period  Weeks    Status  New    Target Date  04/01/18            Plan - 03/04/18 1652    Clinical Impression Statement  Patient has no pain this week.  He was able to walk with improved posture at end of session  with "YOGA"  walking.  He is motivated and is consistant with HEP.STG#1 partially met and STG#4 met.     PT Next Visit Plan  gait training in the parellel bars; right lower extremity strengthening, core strengthening; consider supine march; standing weight shift.     PT Home Exercise Plan  seated clam shell; seated march ; LAQ; ball squeeze     Consulted and Agree with Plan of Care  Patient       Patient will benefit from skilled therapeutic intervention in order to improve the following deficits and impairments:     Visit Diagnosis: Difficulty in walking, not elsewhere classified  Muscle weakness (generalized)  Chronic bilateral low back pain without sciatica     Problem List Patient Active Problem List   Diagnosis Date Noted  . Spinal stenosis of lumbar region 11/17/2017    Class: Chronic  . Status post lumbar laminectomy 11/17/2017  . Lumbar stenosis with neurogenic claudication 08/18/2017  . Forestier's disease of thoracolumbar region 08/18/2017  . Degenerative disc disease, lumbar 08/18/2017  . Lumbar radiculopathy 09/30/2016  . Morbid (severe) obesity due to excess calories ([place]) 09/30/2016  . Biliary calculus with acute cholecystitis 03/21/2016  . Acute pyelonephritis 12/28/2013  . Ureteral stone with hydronephrosis 12/28/2013  . Acute kidney failure ([place]) 12/27/2013  . Severe sepsis with acute organ dysfunction ([place]) 12/27/2013  . Hypotension 12/27/2013  . Lactic acidosis 12/27/2013  . Sepsis secondary to UTI ([place]) 12/27/2013  . EMPYEMA CHEST 08/30/2008  . PRURIGO 08/30/2008  . OBSTRUCTIVE SLEEP APNEA 08/30/2008    Cinde Ebert PTA 03/04/2018, 4:58 PM  [facility] [address] [place], [place], [address] Phone: [phone]   Fax:  [phone]  Name: Reagen Haberman MRN: [id number] Date of Birth: 07-04-1956

## 2018-03-10 ENCOUNTER — Ambulatory Visit: Payer: No Typology Code available for payment source | Admitting: Physical Therapy

## 2018-03-12 ENCOUNTER — Ambulatory Visit: Payer: No Typology Code available for payment source | Admitting: Physical Therapy

## 2018-03-12 ENCOUNTER — Encounter: Payer: Self-pay | Admitting: Physical Therapy

## 2018-03-12 DIAGNOSIS — G8929 Other chronic pain: Secondary | ICD-10-CM

## 2018-03-12 DIAGNOSIS — M6281 Muscle weakness (generalized): Secondary | ICD-10-CM

## 2018-03-12 DIAGNOSIS — R262 Difficulty in walking, not elsewhere classified: Secondary | ICD-10-CM

## 2018-03-12 DIAGNOSIS — M545 Low back pain, unspecified: Secondary | ICD-10-CM

## 2018-03-12 NOTE — Therapy (Addendum)
[place], [place], [address] Phone: [phone]   Fax:  [phone]  Physical Therapy Treatment/Discharge  Patient Details  Name: [name] [name] MRN: [id number] Date of Birth: June 13, 1956 Referring Provider: Dr Basil Dess    Encounter Date: 03/12/2018  PT End of Session - 03/12/18 1558    Visit Number  7    Number of Visits  16    Date for PT Re-Evaluation  04/01/18    PT Start Time  1503    PT Stop Time  1545    PT Time Calculation (min)  42 min    Activity Tolerance  Patient tolerated treatment well    Behavior During Therapy  [facility] for tasks assessed/performed       Past Medical History:  Diagnosis Date  . Anemia   . Arthritis   . Chronic kidney disease    HX acute kidney failure / acute pyelonephritis / hydronephrosis / severe sepsis per discharge summary 12/27/13  . History of kidney stones   . Hypertension   . Morbid obesity ([place])   . Obstructive sleep apnea    does not need c pap since 110 lb wt loss  . Osteoporosis   . Prurigo 2002  . Scars    ON ARMS FROM CHEMICAL EXPLOSION 1999  . Spinal stenosis     Past Surgical History:  Procedure Laterality Date  . BACK SURGERY  01/23/2010   lumbar  . CHOLECYSTECTOMY N/A 03/24/2016   Procedure: LAPAROSCOPIC CHOLECYSTECTOMY WITH INTRAOPERATIVE CHOLANGIOGRAM;  Surgeon: Autumn Messing III, MD;  Location: [facility];  Service: General;  Laterality: N/A;  . CIRCUMCISION    . CYSTOSCOPY WITH RETROGRADE PYELOGRAM, URETEROSCOPY AND STENT PLACEMENT Left 01/20/2014   Procedure: CYSTOSCOPY WITH RETROGRADE PYELOGRAM, URETEROSCOPY AND STENT EXCHANGE;  Surgeon: Molli Hazard, MD;  [facility];  Service: Urology;  Laterality: Left;  bugbee bladder fulguration  . CYSTOSCOPY WITH STENT PLACEMENT Left 12/28/2013   Procedure: CYSTOSCOPY WITH STENT PLACEMENT left retrograde pyleogram;  Surgeon: Molli Hazard, MD;  [facility];  Service: Urology;   Laterality: Left;  . ESOPHAGOGASTRODUODENOSCOPY N/A 12/07/2012   Procedure: ESOPHAGOGASTRODUODENOSCOPY (EGD);  Surgeon: Madilyn Hook, DO;  [facility];  Service: General;  Laterality: N/A;  . HOLMIUM LASER APPLICATION Left 05/15/9936   Procedure: HOLMIUM LASER APPLICATION;  Surgeon: Molli Hazard, MD;  [facility];  Service: Urology;  Laterality: Left;  . LAPAROSCOPIC GASTRIC SLEEVE RESECTION N/A 12/07/2012   Procedure: LAPAROSCOPIC GASTRIC SLEEVE RESECTION;  Surgeon: Madilyn Hook, DO;  [facility];  Service: General;  Laterality: N/A;  laparoscopic sleeve gastrectomy with EGD  . LUMBAR LAMINECTOMY N/A 11/17/2017   Procedure: L2-3 LAMINECTOMY AND REDO LAMINECTOMIES  L3-4, L4-5 AND L5-S1;  Surgeon: Jessy Oto, MD;  Location: [place];  Service: Orthopedics;  Laterality: N/A;    There were no vitals filed for this visit.  Subjective Assessment - 03/12/18 1509    Subjective  No pain,  no falls.  I am walking as much as I .  i use the cardio glider , just started this weekend  Total an hour 10 minutes as a time    Currently in Pain?  No/denies                       [facility] Adult PT Treatment/Exercise - 03/12/18 0001      Ambulation/Gait   Gait Comments  in parallel bars walking knees bent,  with purposeful  weight shifts and ankle,kneeglut activation and YOGA walking.   heel lifts attempts,  toe lifts small.       Lumbar Exercises: Aerobic   Nustep  UE/LR 6 minutes L5      Lumbar Exercises: Seated   Other Seated Lumbar Exercises  gluteal walking"  forward and reverse and to right and left,  cued for minimal use of hands.  difficult    Other Seated Lumbar Exercises  Palof press 10 X each side, blue band      Knee/Hip Exercises: Standing   Functional Squat  10 reps small dips,  mod use of hands      Knee/Hip Exercises: Seated   Hamstring Curl  10 reps;2 sets    Hamstring Limitations  blue band             PT Education - 03/12/18 1557    Education  provided  Yes    Education Details  exercise form,, gait    Person(s) Educated  Patient    Methods  Demonstration;Explanation;Tactile cues;Verbal cues    Comprehension  Verbalized understanding;Returned demonstration       PT Short Term Goals - 03/12/18 1601      PT SHORT TERM GOAL #1   Title  Patient will stand up straight in walker without cuing for 2 minutes    Baseline  able to do in clinic and home    Time  4    Period  Weeks    Status  Achieved      PT SHORT TERM GOAL #2   Title  Patient will increase gross right leg strength to 4/5 `    Baseline  able to work blue band for hamstring curl    Time  4    Period  Weeks    Status  On-going      PT SHORT TERM GOAL #3   Title  Patient will transfer sit to stand mod I     Baseline  able to do     Time  4    Status  Achieved      PT SHORT TERM GOAL #4   Title  Patient will be independent with inital HEP     Baseline  indepdnent with intial HEP     Time  4    Period  Weeks    Status  Achieved        PT Long Term Goals - 02/04/18 1054      PT LONG TERM GOAL #1   Title  Patient will ambaulte 1000' without increased pain with LRAD     Time  8    Period  Weeks    Status  New    Target Date  04/01/18      PT LONG TERM GOAL #2   Title  Patient will be indepdnent with exercise program to promote leg strength and improve gait distance.     Time  8    Period  Weeks    Status  New    Target Date  04/01/18      PT LONG TERM GOAL #3   Title  Patient will demsotrate a 53% limitation on FOTO     Time  8    Period  Weeks    Status  New    Target Date  04/01/18            Plan - 03/12/18 1558    Clinical Impression Statement  No pain in back.  he  does get shoulder pain with reaching left shoulder up wall (5/10 he thinks he has a rotator cuff tear) 2 people noted that his walking posture had improved since last visit. Patient continues to work hard however he fatigues quickly and requires frequent rests.  Patient  needed to cancel one visit last week due to stomach problems from something he ate.     PT Next Visit Plan  gait training in the parellel bars; right lower extremity strengthening, core strengthening; consider supine march; standing weight shift. Check strength goal.    PT Home Exercise Plan  seated clam shell; seated march ; LAQ; ball squeeze     Consulted and Agree with Plan of Care  Patient       Patient will benefit from skilled therapeutic intervention in order to improve the following deficits and impairments:     Visit Diagnosis: Difficulty in walking, not elsewhere classified  Muscle weakness (generalized)  Chronic bilateral low back pain without sciatica  PHYSICAL THERAPY DISCHARGE SUMMARY  Visits from Start of Care: 7  Current functional level related to goals / functional outcomes: Improved posture and endurance with gait    Remaining deficits: Fatigue with gait    Education / Equipment: HEP Plan: Patient agrees to discharge.  Patient goals were met. Patient is being discharged due to not returning since the last visit.  ?????       Problem List Patient Active Problem List   Diagnosis Date Noted  . Spinal stenosis of lumbar region 11/17/2017    Class: Chronic  . Status post lumbar laminectomy 11/17/2017  . Lumbar stenosis with neurogenic claudication 08/18/2017  . Forestier's disease of thoracolumbar region 08/18/2017  . Degenerative disc disease, lumbar 08/18/2017  . Lumbar radiculopathy 09/30/2016  . Morbid (severe) obesity due to excess calories ([place]) 09/30/2016  . Biliary calculus with acute cholecystitis 03/21/2016  . Acute pyelonephritis 12/28/2013  . Ureteral stone with hydronephrosis 12/28/2013  . Acute kidney failure ([place]) 12/27/2013  . Severe sepsis with acute organ dysfunction ([place]) 12/27/2013  . Hypotension 12/27/2013  . Lactic acidosis 12/27/2013  . Sepsis secondary to UTI ([place]) 12/27/2013  . EMPYEMA CHEST 08/30/2008  . PRURIGO 08/30/2008   . OBSTRUCTIVE SLEEP APNEA 08/30/2008   Carolyne Littles PT DPT  05/19/2018 09:12   Cantrell Larouche PTA 03/12/2018, 4:04 PM  [facility] [address] [place], [place], [address] Phone: [phone]   Fax:  [phone]  Name: [name] [name] MRN: [id number] Date of Birth: 1956-09-20

## 2018-03-25 ENCOUNTER — Ambulatory Visit (INDEPENDENT_AMBULATORY_CARE_PROVIDER_SITE_OTHER): Payer: Self-pay

## 2018-03-25 ENCOUNTER — Ambulatory Visit (INDEPENDENT_AMBULATORY_CARE_PROVIDER_SITE_OTHER): Payer: No Typology Code available for payment source | Admitting: Physician Assistant

## 2018-03-25 ENCOUNTER — Encounter (INDEPENDENT_AMBULATORY_CARE_PROVIDER_SITE_OTHER): Payer: Self-pay | Admitting: Physician Assistant

## 2018-03-25 DIAGNOSIS — G8929 Other chronic pain: Secondary | ICD-10-CM

## 2018-03-25 DIAGNOSIS — M25511 Pain in right shoulder: Secondary | ICD-10-CM | POA: Diagnosis not present

## 2018-03-25 NOTE — Progress Notes (Signed)
Office Visit Note   Patient: [name] [name]           Date of Birth: 08/21/1956           MRN: [id number] Visit Date: 03/25/2018              Requested by: Renford Dills, MD [address] [place], [place] [address] PCP: Renford Dills, MD   Assessment & Plan: Visit Diagnoses:  1. Chronic right shoulder pain   2. Acute pain of right shoulder     Plan: Plan: Recommend MRI arthrogram of the right shoulder evaluate rotator cuff for planning of right total shoulder replacement.  Have him follow-up with Dr. August Saucer after the study to go over the results and discuss shoulder replacement in greater length.  Questions were encouraged and answered at length today.  Follow-Up Instructions: Return for FOLLOW UP WITH DR. DEAN  AFTER MRI.   Orders:  Orders Placed This Encounter  Procedures  . XR Shoulder Right  . Arthrogram  . MR SHOULDER RIGHT W CONTRAST   No orders of the defined types were placed in this encounter.     Procedures: No procedures performed   Clinical Data: No additional findings.   Subjective: Chief Complaint  Patient presents with  . Right Shoulder - Pain    HPI [name] [name] is well-known to Dr. Magnus Ivan service comes in today due to right shoulder pain that is been ongoing for at least the last 3 months.  He is status post multiple level laminectomy by Dr. Alvester Morin approximately 3 months out now.  He states that he has had no pain in the shoulder prior to 3 months ago.  He has had no injury.  He states the pain is been horrible over the last 2 to 3 days.  He is right-hand dominant.  He has decreased range of motion of the shoulder.  Has had no treatment for this.  Denies any radicular symptoms down the right arm. Review of Systems Please see HPI otherwise negative  Objective: Vital Signs: There were no vitals taken for this visit.  Physical Exam General: Well-developed well-nourished male in no acute distress mood and affect appropriate.  Psych alert  and oriented x3. Ortho Exam Bilateral shoulders internal rotation 5 out of 5 strength bilaterally.  He does use his deltoid with external rotation despite multiple attempts at trying to externally rotate against resistance.  He has good strength using his deltoid.  He has diminished forward flexion and external rotation of the right shoulder.  Left shoulder he has full forward flexion and external rotation causes some discomfort. Specialty Comments:  No specialty comments available.  Imaging: Xr Shoulder Right  Result Date: 03/25/2018 Right shoulder 3 views shows loose bodies versus synovial chondromatosis about the humeral head.  Glenoid humeral joint space is greatly diminished consistent with severe arthritis.  Humeral heads well located.  No acute fractures.    PMFS History: Patient Active Problem List   Diagnosis Date Noted  . Spinal stenosis of lumbar region 11/17/2017    Class: Chronic  . Status post lumbar laminectomy 11/17/2017  . Lumbar stenosis with neurogenic claudication 08/18/2017  . Forestier's disease of thoracolumbar region 08/18/2017  . Degenerative disc disease, lumbar 08/18/2017  . Lumbar radiculopathy 09/30/2016  . Morbid (severe) obesity due to excess calories ([facility]) 09/30/2016  . Biliary calculus with acute cholecystitis 03/21/2016  . Acute pyelonephritis 12/28/2013  . Ureteral stone with hydronephrosis 12/28/2013  . Acute kidney failure ([facility]) 12/27/2013  .  Severe sepsis with acute organ dysfunction ([facility]) 12/27/2013  . Hypotension 12/27/2013  . Lactic acidosis 12/27/2013  . Sepsis secondary to UTI ([facility]) 12/27/2013  . EMPYEMA CHEST 08/30/2008  . PRURIGO 08/30/2008  . OBSTRUCTIVE SLEEP APNEA 08/30/2008   Past Medical History:  Diagnosis Date  . Anemia   . Arthritis   . Chronic kidney disease    HX acute kidney failure / acute pyelonephritis / hydronephrosis / severe sepsis per discharge summary 12/27/13  . History of kidney stones   . Hypertension   .  Morbid obesity ([facility])   . Obstructive sleep apnea    does not need c pap since 110 lb wt loss  . Osteoporosis   . Prurigo 2002  . Scars    ON ARMS FROM CHEMICAL EXPLOSION 1999  . Spinal stenosis     No family history on file.  Past Surgical History:  Procedure Laterality Date  . BACK SURGERY  01/23/2010   lumbar  . CHOLECYSTECTOMY N/A 03/24/2016   Procedure: LAPAROSCOPIC CHOLECYSTECTOMY WITH INTRAOPERATIVE CHOLANGIOGRAM;  Surgeon: Chevis PrettyPaul Toth III, MD;  [facility];  Service: General;  Laterality: N/A;  . CIRCUMCISION    . CYSTOSCOPY WITH RETROGRADE PYELOGRAM, URETEROSCOPY AND STENT PLACEMENT Left 01/20/2014   Procedure: CYSTOSCOPY WITH RETROGRADE PYELOGRAM, URETEROSCOPY AND STENT EXCHANGE;  Surgeon: Milford Cageaniel Young Woodruff, MD;  [facility];  Service: Urology;  Laterality: Left;  bugbee bladder fulguration  . CYSTOSCOPY WITH STENT PLACEMENT Left 12/28/2013   Procedure: CYSTOSCOPY WITH STENT PLACEMENT left retrograde pyleogram;  Surgeon: Milford Cageaniel Young Woodruff, MD;  [facility];  Service: Urology;  Laterality: Left;  . ESOPHAGOGASTRODUODENOSCOPY N/A 12/07/2012   Procedure: ESOPHAGOGASTRODUODENOSCOPY (EGD);  Surgeon: Lodema PilotBrian Layton, DO;  [facility];  Service: General;  Laterality: N/A;  . HOLMIUM LASER APPLICATION Left 01/20/2014   Procedure: HOLMIUM LASER APPLICATION;  Surgeon: Milford Cageaniel Young Woodruff, MD;  [facility];  Service: Urology;  Laterality: Left;  . LAPAROSCOPIC GASTRIC SLEEVE RESECTION N/A 12/07/2012   Procedure: LAPAROSCOPIC GASTRIC SLEEVE RESECTION;  Surgeon: Lodema PilotBrian Layton, DO;  [facility];  Service: General;  Laterality: N/A;  laparoscopic sleeve gastrectomy with EGD  . LUMBAR LAMINECTOMY N/A 11/17/2017   Procedure: L2-3 LAMINECTOMY AND REDO LAMINECTOMIES  L3-4, L4-5 AND L5-S1;  Surgeon: Kerrin ChampagneNitka, James E, MD;  [facility];  Service: Orthopedics;  Laterality: N/A;   Social History   Occupational History  . Not on file  Tobacco Use  . Smoking status: Former  Smoker    Types: Cigarettes    Last attempt to quit: 04/09/1974    Years since quitting: 43.9  . Smokeless tobacco: Never Used  Substance and Sexual Activity  . Alcohol use: No  . Drug use: No  . Sexual activity: Not on file

## 2018-04-02 ENCOUNTER — Ambulatory Visit (INDEPENDENT_AMBULATORY_CARE_PROVIDER_SITE_OTHER): Payer: No Typology Code available for payment source | Admitting: Specialist

## 2018-04-08 ENCOUNTER — Ambulatory Visit (INDEPENDENT_AMBULATORY_CARE_PROVIDER_SITE_OTHER): Payer: No Typology Code available for payment source | Admitting: Orthopedic Surgery

## 2018-04-10 ENCOUNTER — Ambulatory Visit
Admission: RE | Admit: 2018-04-10 | Discharge: 2018-04-10 | Disposition: A | Discharge status: Home / Self Care | Payer: No Typology Code available for payment source | Source: Ambulatory Visit | Attending: Physician Assistant | Admitting: Physician Assistant

## 2018-04-10 DIAGNOSIS — M25511 Pain in right shoulder: Secondary | ICD-10-CM

## 2018-04-10 MED ORDER — IOPAMIDOL (ISOVUE-M 200) INJECTION 41%
15.0000 mL | Freq: Once | INTRAMUSCULAR | Status: AC
  Administered 2018-04-10: 15 mL via INTRA_ARTICULAR

## 2018-04-15 ENCOUNTER — Ambulatory Visit (INDEPENDENT_AMBULATORY_CARE_PROVIDER_SITE_OTHER): Payer: No Typology Code available for payment source | Admitting: Orthopedic Surgery

## 2018-04-27 ENCOUNTER — Ambulatory Visit (INDEPENDENT_AMBULATORY_CARE_PROVIDER_SITE_OTHER): Payer: No Typology Code available for payment source | Admitting: Orthopedic Surgery

## 2018-04-27 ENCOUNTER — Encounter (INDEPENDENT_AMBULATORY_CARE_PROVIDER_SITE_OTHER): Payer: Self-pay | Admitting: Orthopedic Surgery

## 2018-04-27 DIAGNOSIS — M25511 Pain in right shoulder: Secondary | ICD-10-CM

## 2018-04-27 DIAGNOSIS — G8929 Other chronic pain: Secondary | ICD-10-CM | POA: Diagnosis not present

## 2018-04-27 DIAGNOSIS — M19011 Primary osteoarthritis, right shoulder: Secondary | ICD-10-CM | POA: Diagnosis not present

## 2018-04-28 ENCOUNTER — Encounter (INDEPENDENT_AMBULATORY_CARE_PROVIDER_SITE_OTHER): Payer: Self-pay | Admitting: Orthopedic Surgery

## 2018-04-28 NOTE — Progress Notes (Signed)
Office Visit Note   Patient: [name] [name]           Date of Birth: Sep 20, 1956           MRN: [id number] Visit Date: 04/27/2018 Requested by: Renford DillsPolite, Ronald, MD [address] [place], [place] [id number] PCP: Renford DillsPolite, Ronald, MD  Subjective: Chief Complaint  Patient presents with  . Right Shoulder - Follow-up    HPI: [name] [name] is a patient with bilateral shoulder pain.  The right when actually hurts him more than the left.  Last week he had no symptoms in the right shoulder but most days he is symptomatic.  Patient states that the shoulder pain will wake him from sleep at night.  He has not had any injections.  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  He is taking ibuprofen alternating with Mobic.  He had back surgery and does weight-bear through his shoulders with a walker.  MRI scan of the shoulder has been performed.  It shows severe glenohumeral arthritis with multiple large loose bodies including some within the biceps tendon sheath.  He has a little bit of supraspinatus tendinosis as well.              ROS: All systems reviewed are negative as they relate to the chief complaint within the history of present illness.  Patient denies  fevers or chills.   Assessment & Plan: Visit Diagnoses:  1. Chronic right shoulder pain   2. Arthritis of right shoulder region     Plan: Impression is right shoulder arthritis on the right-hand side with rotator cuff arthropathy on the left.  I am to try him with an injection done by Dr. Alvester MorinNewton into the right shoulder glenohumeral joint.  We will see how long that lasts.  I think he may be heading for shoulder replacement in the future.  I would likely use a convertible glenoid for [name] [name] because of the supraspinatus tendinosis.  I will see him back a month after the injection with Dr. Alvester MorinNewton.  Follow-Up Instructions: Return if symptoms worsen or fail to improve.   Orders:  Orders Placed This Encounter  Procedures  . Ambulatory  referral to Physical Medicine Rehab   No orders of the defined types were placed in this encounter.     Procedures: No procedures performed   Clinical Data: No additional findings.  Objective: Vital Signs: There were no vitals taken for this visit.  Physical Exam:   Constitutional: Patient appears well-developed HEENT:  Head: Normocephalic Eyes:EOM are normal Neck: Normal range of motion Cardiovascular: Normal rate Pulmonary/chest: Effort normal Neurologic: Patient is alert Skin: Skin is warm Psychiatric: Patient has normal mood and affect    Ortho Exam: Orthopedic exam demonstrates pretty reasonable cervical spine range of motion with palpable radial pulses bilaterally.  On the left-hand side the patient has about 45 degrees of active forward flexion and abduction.  On the right he has forward flexion abduction just to or slightly past 90 degrees but it is painful.  External rotation on the right at 15 degrees of abduction is only about 20 degrees.  Cuff strength is intact on the right to supraspinatus supraspinatus and subscap muscle testing.  No real tenderness over the biceps tendon anteriorly.  Specialty Comments:  No specialty comments available.  Imaging: No results found.   PMFS History: Patient Active Problem List   Diagnosis Date Noted  . Spinal stenosis of lumbar region 11/17/2017    Class:  Chronic  . Status post lumbar laminectomy 11/17/2017  . Lumbar stenosis with neurogenic claudication 08/18/2017  . Forestier's disease of thoracolumbar region 08/18/2017  . Degenerative disc disease, lumbar 08/18/2017  . Lumbar radiculopathy 09/30/2016  . Morbid (severe) obesity due to excess calories ([facility]) 09/30/2016  . Biliary calculus with acute cholecystitis 03/21/2016  . Acute pyelonephritis 12/28/2013  . Ureteral stone with hydronephrosis 12/28/2013  . Acute kidney failure ([facility]) 12/27/2013  . Severe sepsis with acute organ dysfunction ([facility]) 12/27/2013  .  Hypotension 12/27/2013  . Lactic acidosis 12/27/2013  . Sepsis secondary to UTI ([facility]) 12/27/2013  . EMPYEMA CHEST 08/30/2008  . PRURIGO 08/30/2008  . OBSTRUCTIVE SLEEP APNEA 08/30/2008   Past Medical History:  Diagnosis Date  . Anemia   . Arthritis   . Chronic kidney disease    HX acute kidney failure / acute pyelonephritis / hydronephrosis / severe sepsis per discharge summary 12/27/13  . History of kidney stones   . Hypertension   . Morbid obesity ([facility])   . Obstructive sleep apnea    does not need c pap since 110 lb wt loss  . Osteoporosis   . Prurigo 2002  . Scars    ON ARMS FROM CHEMICAL EXPLOSION 1999  . Spinal stenosis     History reviewed. No pertinent family history.  Past Surgical History:  Procedure Laterality Date  . BACK SURGERY  01/23/2010   lumbar  . CHOLECYSTECTOMY N/A 03/24/2016   Procedure: LAPAROSCOPIC CHOLECYSTECTOMY WITH INTRAOPERATIVE CHOLANGIOGRAM;  Surgeon: Chevis Pretty III, MD;  [facility];  Service: General;  Laterality: N/A;  . CIRCUMCISION    . CYSTOSCOPY WITH RETROGRADE PYELOGRAM, URETEROSCOPY AND STENT PLACEMENT Left 01/20/2014   Procedure: CYSTOSCOPY WITH RETROGRADE PYELOGRAM, URETEROSCOPY AND STENT EXCHANGE;  Surgeon: Milford Cage, MD;  [facility];  Service: Urology;  Laterality: Left;  bugbee bladder fulguration  . CYSTOSCOPY WITH STENT PLACEMENT Left 12/28/2013   Procedure: CYSTOSCOPY WITH STENT PLACEMENT left retrograde pyleogram;  Surgeon: Milford Cage, MD;  [facility];  Service: Urology;  Laterality: Left;  . ESOPHAGOGASTRODUODENOSCOPY N/A 12/07/2012   Procedure: ESOPHAGOGASTRODUODENOSCOPY (EGD);  Surgeon: Lodema Pilot, DO;  [facility];  Service: General;  Laterality: N/A;  . HOLMIUM LASER APPLICATION Left 01/20/2014   Procedure: HOLMIUM LASER APPLICATION;  Surgeon: Milford Cage, MD;  [facility];  Service: Urology;  Laterality: Left;  . LAPAROSCOPIC GASTRIC SLEEVE RESECTION N/A 12/07/2012    Procedure: LAPAROSCOPIC GASTRIC SLEEVE RESECTION;  Surgeon: Lodema Pilot, DO;  [facility];  Service: General;  Laterality: N/A;  laparoscopic sleeve gastrectomy with EGD  . LUMBAR LAMINECTOMY N/A 11/17/2017   Procedure: L2-3 LAMINECTOMY AND REDO LAMINECTOMIES  L3-4, L4-5 AND L5-S1;  Surgeon: Kerrin Champagne, MD;  [facility];  Service: Orthopedics;  Laterality: N/A;   Social History   Occupational History  . Not on file  Tobacco Use  . Smoking status: Former Smoker    Types: Cigarettes    Last attempt to quit: 04/09/1974    Years since quitting: 44.0  . Smokeless tobacco: Never Used  Substance and Sexual Activity  . Alcohol use: No  . Drug use: No  . Sexual activity: Not on file

## 2018-05-11 ENCOUNTER — Ambulatory Visit (INDEPENDENT_AMBULATORY_CARE_PROVIDER_SITE_OTHER): Payer: No Typology Code available for payment source | Admitting: Physical Medicine and Rehabilitation

## 2018-05-11 ENCOUNTER — Encounter (INDEPENDENT_AMBULATORY_CARE_PROVIDER_SITE_OTHER): Payer: Self-pay | Admitting: Physical Medicine and Rehabilitation

## 2018-05-11 ENCOUNTER — Ambulatory Visit (INDEPENDENT_AMBULATORY_CARE_PROVIDER_SITE_OTHER): Payer: Self-pay

## 2018-05-11 DIAGNOSIS — M25511 Pain in right shoulder: Secondary | ICD-10-CM

## 2018-05-11 DIAGNOSIS — G8929 Other chronic pain: Secondary | ICD-10-CM

## 2018-05-11 NOTE — Progress Notes (Signed)
[name] [name] - 62 y.o. male MRN [id number]  Date of birth: 1956/09/04  Office Visit Note: Visit Date: 05/11/2018 PCP: Renford Dills, MD Referred by: Renford Dills, MD  Subjective: Chief Complaint  Patient presents with  . Right Shoulder - Pain  . Right Arm - Pain, Numbness, Tingling   HPI: [name] [name] is a 62 year old gentleman who is well-known to our office as we have treated him for his lower back problems in the past.  He comes in today with chronic worsening right shoulder pain.  Dr. August Saucer request diagnostic and hopefully therapeutic anesthetic arthrogram of the glenohumeral joint on the right.  He has pretty significant osteoarthritis with loose bodies.   ROS Otherwise per HPI.  Assessment & Plan: Visit Diagnoses:  1. Chronic right shoulder pain     Plan: Findings:  Diagnostic and therapeutic anesthetic arthrogram of the glenohumeral joint on the right.  Patient had significantly increased range of motion decrease pain after anesthetic portion of the injection.    Meds & Orders: No orders of the defined types were placed in this encounter.   Orders Placed This Encounter  Procedures  . Large Joint Inj: R glenohumeral  . XR C-ARM NO REPORT    Follow-up: Return in about 1 month (around 06/08/2018) for Dr. August Saucer.   Procedures: Large Joint Inj: R glenohumeral on 05/11/2018 8:19 AM Indications: pain and diagnostic evaluation Details: 22 G 3.5 in needle, anteromedial approach  Arthrogram: Yes  Medications: 3 mL bupivacaine 0.5 %; 60 mg triamcinolone acetonide 40 MG/ML  Arthrogram demonstrated excellent flow of contrast throughout the joint surface without extravasation or obvious defect.  The patient had relief of symptoms during the anesthetic phase of the injection.  Procedure, treatment alternatives, risks and benefits explained, specific risks discussed. Consent was given by the patient. Immediately prior to procedure a time out was called to verify the correct patient,  procedure, equipment, support staff and site/side marked as required. Patient was prepped and draped in the usual sterile fashion.      No notes on file   Clinical History: Lumbar spine MRI dated 06/20/2016  L1-2: Moderate facet hypertrophy has progressed. Mild left subarticular and moderate left foraminal narrowing is stable. Mild right foraminal narrowing is present.  L2-3: A broad-based disc protrusion is asymmetric to the left. Moderate facet hypertrophy has progressed, left greater than right. Moderate left subarticular and foraminal narrowing is present. Mild right subarticular and foraminal narrowing is noted.  L3-4: A broad-based disc protrusion is present. The patient is status post laminectomy. Advanced facet hypertrophy has progressed. Mild subarticular narrowing is worse on the right. Mild foraminal narrowing is present bilaterally.  L4-5: A wide laminectomy is noted. The central canal is decompressed. Residual recurrent rightward disc protrusion has progressed. The posterior canal is open. Moderate right subarticular stenosis is present. Severe right and moderate left foraminal narrowing is noted. This has progressed.  L5-S1: A wide laminectomy is present. Progressive facet degenerative changes are noted. Moderate left and mild right foraminal stenosis has progressed.   He reports that he quit smoking about 44 years ago. His smoking use included cigarettes. He has never used smokeless tobacco.  Recent Labs    07/10/17 1107  HGBA1C 5.1    Objective:  VS:  HT:    WT:   BMI:     BP:   HR: bpm  TEMP: ( )  RESP:  Physical Exam  Ortho Exam Imaging: Xr C-arm No Report  Result Date: 05/11/2018 Please see Notes  tab for imaging impression.   Past Medical/Family/Surgical/Social History: Medications & Allergies reviewed per EMR, new medications updated. Patient Active Problem List   Diagnosis Date Noted  . Spinal stenosis of lumbar region 11/17/2017     Class: Chronic  . Status post lumbar laminectomy 11/17/2017  . Lumbar stenosis with neurogenic claudication 08/18/2017  . Forestier's disease of thoracolumbar region 08/18/2017  . Degenerative disc disease, lumbar 08/18/2017  . Lumbar radiculopathy 09/30/2016  . Morbid (severe) obesity due to excess calories (HCC) 09/30/2016  . Biliary calculus with acute cholecystitis 03/21/2016  . Acute pyelonephritis 12/28/2013  . Ureteral stone with hydronephrosis 12/28/2013  . Acute kidney failure (HCC) 12/27/2013  . Severe sepsis with acute organ dysfunction (HCC) 12/27/2013  . Hypotension 12/27/2013  . Lactic acidosis 12/27/2013  . Sepsis secondary to UTI (HCC) 12/27/2013  . EMPYEMA CHEST 08/30/2008  . PRURIGO 08/30/2008  . OBSTRUCTIVE SLEEP APNEA 08/30/2008   Past Medical History:  Diagnosis Date  . Anemia   . Arthritis   . Chronic kidney disease    HX acute kidney failure / acute pyelonephritis / hydronephrosis / severe sepsis per discharge summary 12/27/13  . History of kidney stones   . Hypertension   . Morbid obesity (HCC)   . Obstructive sleep apnea    does not need c pap since 110 lb wt loss  . Osteoporosis   . Prurigo 2002  . Scars    ON ARMS FROM CHEMICAL EXPLOSION 1999  . Spinal stenosis    History reviewed. No pertinent family history. Past Surgical History:  Procedure Laterality Date  . BACK SURGERY  01/23/2010   lumbar  . CHOLECYSTECTOMY N/A 03/24/2016   Procedure: LAPAROSCOPIC CHOLECYSTECTOMY WITH INTRAOPERATIVE CHOLANGIOGRAM;  Surgeon: Chevis PrettyPaul Toth III, MD;  Location: [facility];  Service: General;  Laterality: N/A;  . CIRCUMCISION    . CYSTOSCOPY WITH RETROGRADE PYELOGRAM, URETEROSCOPY AND STENT PLACEMENT Left 01/20/2014   Procedure: CYSTOSCOPY WITH RETROGRADE PYELOGRAM, URETEROSCOPY AND STENT EXCHANGE;  Surgeon: Milford Cageaniel Young Woodruff, MD;  Location: [facility];  Service: Urology;  Laterality: Left;  bugbee bladder fulguration  . CYSTOSCOPY WITH STENT PLACEMENT Left 12/28/2013    Procedure: CYSTOSCOPY WITH STENT PLACEMENT left retrograde pyleogram;  Surgeon: Milford Cageaniel Young Woodruff, MD;  Location: [facility];  Service: Urology;  Laterality: Left;  . ESOPHAGOGASTRODUODENOSCOPY N/A 12/07/2012   Procedure: ESOPHAGOGASTRODUODENOSCOPY (EGD);  Surgeon: Lodema PilotBrian Layton, DO;  Location: [facility];  Service: General;  Laterality: N/A;  . HOLMIUM LASER APPLICATION Left 01/20/2014   Procedure: HOLMIUM LASER APPLICATION;  Surgeon: Milford Cageaniel Young Woodruff, MD;  [facility]: [facility];  Service: Urology;  Laterality: Left;  . LAPAROSCOPIC GASTRIC SLEEVE RESECTION N/A 12/07/2012   Procedure: LAPAROSCOPIC GASTRIC SLEEVE RESECTION;  Surgeon: Lodema PilotBrian Layton, DO;  Location: [facility];  Service: General;  Laterality: N/A;  laparoscopic sleeve gastrectomy with EGD  . LUMBAR LAMINECTOMY N/A 11/17/2017   Procedure: L2-3 LAMINECTOMY AND REDO LAMINECTOMIES  L3-4, L4-5 AND L5-S1;  Surgeon: Kerrin ChampagneNitka, James E, MD;  Location: [facility];  Service: Orthopedics;  Laterality: N/A;   Social History   Occupational History  . Not on file  Tobacco Use  . Smoking status: Former Smoker    Types: Cigarettes    Last attempt to quit: 04/09/1974    Years since quitting: 44.1  . Smokeless tobacco: Never Used  Substance and Sexual Activity  . Alcohol use: No  . Drug use: No  . Sexual activity: Not on file

## 2018-05-11 NOTE — Patient Instructions (Signed)

## 2018-05-11 NOTE — Progress Notes (Signed)
 .  Numeric Pain Rating Scale and Functional Assessment Average Pain 5   In the last MONTH (on 0-10 scale) has pain interfered with the following?  1. General activity like being  able to carry out your everyday physical activities such as walking, climbing stairs, carrying groceries, or moving a chair?  Rating(4)   -Dye Allergies.  

## 2018-05-12 MED ORDER — TRIAMCINOLONE ACETONIDE 40 MG/ML IJ SUSP
60.0000 mg | INTRAMUSCULAR | Status: AC | PRN
  Administered 2018-05-11: 60 mg via INTRA_ARTICULAR

## 2018-05-12 MED ORDER — BUPIVACAINE HCL 0.5 % IJ SOLN
3.0000 mL | INTRAMUSCULAR | Status: AC | PRN
  Administered 2018-05-11: 3 mL via INTRA_ARTICULAR

## 2018-05-19 ENCOUNTER — Encounter: Payer: Self-pay | Admitting: Physical Therapy

## 2018-05-23 ENCOUNTER — Other Ambulatory Visit (INDEPENDENT_AMBULATORY_CARE_PROVIDER_SITE_OTHER): Payer: Self-pay | Admitting: Physical Medicine and Rehabilitation

## 2018-05-25 NOTE — Telephone Encounter (Signed)
Please advise 

## 2018-06-10 ENCOUNTER — Ambulatory Visit (INDEPENDENT_AMBULATORY_CARE_PROVIDER_SITE_OTHER): Payer: No Typology Code available for payment source | Admitting: Orthopedic Surgery

## 2018-06-10 ENCOUNTER — Encounter (INDEPENDENT_AMBULATORY_CARE_PROVIDER_SITE_OTHER): Payer: Self-pay | Admitting: Orthopedic Surgery

## 2018-06-10 DIAGNOSIS — M25511 Pain in right shoulder: Secondary | ICD-10-CM

## 2018-06-10 DIAGNOSIS — G8929 Other chronic pain: Secondary | ICD-10-CM

## 2018-06-10 NOTE — Progress Notes (Signed)
re

## 2018-06-10 NOTE — Progress Notes (Signed)
Office Visit Note   Patient: [name] [name]           Date of Birth: 11-17-1955           MRN: [id number] Visit Date: 06/10/2018 Requested by: Renford Dills, MD [address] [place], [place] [address] PCP: Renford Dills, MD  Subjective: Chief Complaint  Patient presents with  . Right Shoulder - Follow-up    HPI: [name] [name] is a patient with right shoulder glenohumeral arthritis on the right and rotator cuff arthropathy on the left.  He had an injection a month ago in the glenohumeral joint by Dr. Alvester Morin.  Takes Mobic as needed.  He states he is feeling better.  Has occasional ache but no real pain.  He does have a weightbearing shoulder with the use of the walker as well as when he gets up.              ROS: All systems reviewed are negative as they relate to the chief complaint within the history of present illness.  Patient denies  fevers or chills.   Assessment & Plan: Visit Diagnoses: No diagnosis found.  Plan: Impression is bilateral shoulder arthritis with osteoarthritis on the right and rotator cuff arthropathy on the left.  Left foot is not symptomatic and the right one has improved significantly following injection.  I think we could do about 2 to 3 injections/year in the joint for pain relief.  We will plan on having him see Dr. Alvester Morin again in November for another right shoulder injection.  I will see him back as needed.  Follow-Up Instructions: No follow-ups on file.   Orders:  No orders of the defined types were placed in this encounter.  No orders of the defined types were placed in this encounter.     Procedures: No procedures performed   Clinical Data: No additional findings.  Objective: Vital Signs: There were no vitals taken for this visit.  Physical Exam:   Constitutional: Patient appears well-developed HEENT:  Head: Normocephalic Eyes:EOM are normal Neck: Normal range of motion Cardiovascular: Normal rate Pulmonary/chest: Effort  normal Neurologic: Patient is alert Skin: Skin is warm Psychiatric: Patient has normal mood and affect    Ortho Exam: Ortho exam demonstrates some restricted range of motion on the right consistent with his known diagnosis of arthritis.  Deltoid is functional.  He does do a fair amount of weightbearing in that right shoulder when he gets up from a chair.  He also weight bears a significant degree when he is using his walker.  Motion is restricted below shoulder level with both arms.  Specialty Comments:  No specialty comments available.  Imaging: No results found.   PMFS History: Patient Active Problem List   Diagnosis Date Noted  . Spinal stenosis of lumbar region 11/17/2017    Class: Chronic  . Status post lumbar laminectomy 11/17/2017  . Lumbar stenosis with neurogenic claudication 08/18/2017  . Forestier's disease of thoracolumbar region 08/18/2017  . Degenerative disc disease, lumbar 08/18/2017  . Lumbar radiculopathy 09/30/2016  . Morbid (severe) obesity due to excess calories (HCC) 09/30/2016  . Biliary calculus with acute cholecystitis 03/21/2016  . Acute pyelonephritis 12/28/2013  . Ureteral stone with hydronephrosis 12/28/2013  . Acute kidney failure (HCC) 12/27/2013  . Severe sepsis with acute organ dysfunction (HCC) 12/27/2013  . Hypotension 12/27/2013  . Lactic acidosis 12/27/2013  . Sepsis secondary to UTI (HCC) 12/27/2013  . EMPYEMA CHEST 08/30/2008  . PRURIGO 08/30/2008  . OBSTRUCTIVE  SLEEP APNEA 08/30/2008   Past Medical History:  Diagnosis Date  . Anemia   . Arthritis   . Chronic kidney disease    HX acute kidney failure / acute pyelonephritis / hydronephrosis / severe sepsis per discharge summary 12/27/13  . History of kidney stones   . Hypertension   . Morbid obesity (HCC)   . Obstructive sleep apnea    does not need c pap since 110 lb wt loss  . Osteoporosis   . Prurigo 2002  . Scars    ON ARMS FROM CHEMICAL EXPLOSION 1999  . Spinal stenosis       History reviewed. No pertinent family history.  Past Surgical History:  Procedure Laterality Date  . BACK SURGERY  01/23/2010   lumbar  . CHOLECYSTECTOMY N/A 03/24/2016   Procedure: LAPAROSCOPIC CHOLECYSTECTOMY WITH INTRAOPERATIVE CHOLANGIOGRAM;  Surgeon: Chevis PrettyPaul Toth III, MD;  Location: [facility];  Service: General;  Laterality: N/A;  . CIRCUMCISION    . CYSTOSCOPY WITH RETROGRADE PYELOGRAM, URETEROSCOPY AND STENT PLACEMENT Left 01/20/2014   Procedure: CYSTOSCOPY WITH RETROGRADE PYELOGRAM, URETEROSCOPY AND STENT EXCHANGE;  Surgeon: Milford Cageaniel Young Woodruff, MD;  [facility];  Service: Urology;  Laterality: Left;  bugbee bladder fulguration  . CYSTOSCOPY WITH STENT PLACEMENT Left 12/28/2013   Procedure: CYSTOSCOPY WITH STENT PLACEMENT left retrograde pyleogram;  Surgeon: Milford Cageaniel Young Woodruff, MD;  [facility];  Service: Urology;  Laterality: Left;  . ESOPHAGOGASTRODUODENOSCOPY N/A 12/07/2012   Procedure: ESOPHAGOGASTRODUODENOSCOPY (EGD);  Surgeon: Lodema PilotBrian Layton, DO;  [facility];  Service: General;  Laterality: N/A;  . HOLMIUM LASER APPLICATION Left 01/20/2014   Procedure: HOLMIUM LASER APPLICATION;  Surgeon: Milford Cageaniel Young Woodruff, MD;  [facility];  Service: Urology;  Laterality: Left;  . LAPAROSCOPIC GASTRIC SLEEVE RESECTION N/A 12/07/2012   Procedure: LAPAROSCOPIC GASTRIC SLEEVE RESECTION;  Surgeon: Lodema PilotBrian Layton, DO;  Location: [facility];  Service: General;  Laterality: N/A;  laparoscopic sleeve gastrectomy with EGD  . LUMBAR LAMINECTOMY N/A 11/17/2017   Procedure: L2-3 LAMINECTOMY AND REDO LAMINECTOMIES  L3-4, L4-5 AND L5-S1;  Surgeon: Kerrin ChampagneNitka, James E, MD;  Location: [facility];  Service: Orthopedics;  Laterality: N/A;   Social History   Occupational History  . Not on file  Tobacco Use  . Smoking status: Former Smoker    Types: Cigarettes    Last attempt to quit: 04/09/1974    Years since quitting: 44.2  . Smokeless tobacco: Never Used  Substance and Sexual Activity  . Alcohol use: No   . Drug use: No  . Sexual activity: Not on file

## 2018-06-19 ENCOUNTER — Ambulatory Visit (INDEPENDENT_AMBULATORY_CARE_PROVIDER_SITE_OTHER): Payer: Self-pay | Admitting: Physical Medicine and Rehabilitation

## 2018-06-27 ENCOUNTER — Other Ambulatory Visit: Payer: Self-pay

## 2018-06-27 ENCOUNTER — Encounter (HOSPITAL_COMMUNITY): Payer: Self-pay | Admitting: Emergency Medicine

## 2018-06-27 ENCOUNTER — Inpatient Hospital Stay (HOSPITAL_COMMUNITY)
Admission: EM | Admit: 2018-06-27 | Discharge: 2018-07-09 | DRG: 853 | Disposition: A | Discharge status: Rehabilitation Facility | Payer: No Typology Code available for payment source | Attending: Specialist | Admitting: Specialist

## 2018-06-27 ENCOUNTER — Emergency Department (HOSPITAL_COMMUNITY): Payer: No Typology Code available for payment source

## 2018-06-27 DIAGNOSIS — Z23 Encounter for immunization: Secondary | ICD-10-CM | POA: Diagnosis not present

## 2018-06-27 DIAGNOSIS — Z87891 Personal history of nicotine dependence: Secondary | ICD-10-CM | POA: Diagnosis not present

## 2018-06-27 DIAGNOSIS — G061 Intraspinal abscess and granuloma: Secondary | ICD-10-CM | POA: Diagnosis present

## 2018-06-27 DIAGNOSIS — Z87442 Personal history of urinary calculi: Secondary | ICD-10-CM

## 2018-06-27 DIAGNOSIS — M4722 Other spondylosis with radiculopathy, cervical region: Secondary | ICD-10-CM | POA: Diagnosis not present

## 2018-06-27 DIAGNOSIS — M79672 Pain in left foot: Secondary | ICD-10-CM

## 2018-06-27 DIAGNOSIS — E440 Moderate protein-calorie malnutrition: Secondary | ICD-10-CM | POA: Diagnosis not present

## 2018-06-27 DIAGNOSIS — R296 Repeated falls: Secondary | ICD-10-CM | POA: Diagnosis present

## 2018-06-27 DIAGNOSIS — Z6841 Body Mass Index (BMI) 40.0 and over, adult: Secondary | ICD-10-CM | POA: Diagnosis not present

## 2018-06-27 DIAGNOSIS — E1122 Type 2 diabetes mellitus with diabetic chronic kidney disease: Secondary | ICD-10-CM | POA: Diagnosis present

## 2018-06-27 DIAGNOSIS — M5136 Other intervertebral disc degeneration, lumbar region: Secondary | ICD-10-CM | POA: Diagnosis present

## 2018-06-27 DIAGNOSIS — M4802 Spinal stenosis, cervical region: Secondary | ICD-10-CM | POA: Diagnosis present

## 2018-06-27 DIAGNOSIS — M19011 Primary osteoarthritis, right shoulder: Secondary | ICD-10-CM | POA: Diagnosis not present

## 2018-06-27 DIAGNOSIS — R52 Pain, unspecified: Secondary | ICD-10-CM

## 2018-06-27 DIAGNOSIS — Z9049 Acquired absence of other specified parts of digestive tract: Secondary | ICD-10-CM | POA: Diagnosis not present

## 2018-06-27 DIAGNOSIS — M4714 Other spondylosis with myelopathy, thoracic region: Secondary | ICD-10-CM | POA: Diagnosis present

## 2018-06-27 DIAGNOSIS — D72829 Elevated white blood cell count, unspecified: Secondary | ICD-10-CM

## 2018-06-27 DIAGNOSIS — Z833 Family history of diabetes mellitus: Secondary | ICD-10-CM

## 2018-06-27 DIAGNOSIS — M25462 Effusion, left knee: Secondary | ICD-10-CM

## 2018-06-27 DIAGNOSIS — M25532 Pain in left wrist: Secondary | ICD-10-CM

## 2018-06-27 DIAGNOSIS — R7881 Bacteremia: Secondary | ICD-10-CM

## 2018-06-27 DIAGNOSIS — M009 Pyogenic arthritis, unspecified: Secondary | ICD-10-CM | POA: Diagnosis not present

## 2018-06-27 DIAGNOSIS — M21371 Foot drop, right foot: Secondary | ICD-10-CM | POA: Diagnosis present

## 2018-06-27 DIAGNOSIS — M6250 Muscle wasting and atrophy, not elsewhere classified, unspecified site: Secondary | ICD-10-CM | POA: Diagnosis present

## 2018-06-27 DIAGNOSIS — G4733 Obstructive sleep apnea (adult) (pediatric): Secondary | ICD-10-CM | POA: Diagnosis not present

## 2018-06-27 DIAGNOSIS — D638 Anemia in other chronic diseases classified elsewhere: Secondary | ICD-10-CM | POA: Diagnosis present

## 2018-06-27 DIAGNOSIS — M19012 Primary osteoarthritis, left shoulder: Secondary | ICD-10-CM | POA: Diagnosis not present

## 2018-06-27 DIAGNOSIS — G8220 Paraplegia, unspecified: Secondary | ICD-10-CM | POA: Diagnosis present

## 2018-06-27 DIAGNOSIS — M25512 Pain in left shoulder: Secondary | ICD-10-CM

## 2018-06-27 DIAGNOSIS — R339 Retention of urine, unspecified: Secondary | ICD-10-CM | POA: Diagnosis present

## 2018-06-27 DIAGNOSIS — N319 Neuromuscular dysfunction of bladder, unspecified: Secondary | ICD-10-CM | POA: Diagnosis present

## 2018-06-27 DIAGNOSIS — M75122 Complete rotator cuff tear or rupture of left shoulder, not specified as traumatic: Secondary | ICD-10-CM | POA: Diagnosis present

## 2018-06-27 DIAGNOSIS — N170 Acute kidney failure with tubular necrosis: Secondary | ICD-10-CM | POA: Diagnosis not present

## 2018-06-27 DIAGNOSIS — M25511 Pain in right shoulder: Secondary | ICD-10-CM

## 2018-06-27 DIAGNOSIS — Z9884 Bariatric surgery status: Secondary | ICD-10-CM | POA: Diagnosis not present

## 2018-06-27 DIAGNOSIS — I129 Hypertensive chronic kidney disease with stage 1 through stage 4 chronic kidney disease, or unspecified chronic kidney disease: Secondary | ICD-10-CM | POA: Diagnosis not present

## 2018-06-27 DIAGNOSIS — R531 Weakness: Secondary | ICD-10-CM | POA: Diagnosis present

## 2018-06-27 DIAGNOSIS — Z881 Allergy status to other antibiotic agents status: Secondary | ICD-10-CM | POA: Diagnosis not present

## 2018-06-27 DIAGNOSIS — R338 Other retention of urine: Secondary | ICD-10-CM

## 2018-06-27 DIAGNOSIS — N179 Acute kidney failure, unspecified: Secondary | ICD-10-CM

## 2018-06-27 DIAGNOSIS — N182 Chronic kidney disease, stage 2 (mild): Secondary | ICD-10-CM | POA: Diagnosis not present

## 2018-06-27 DIAGNOSIS — Z791 Long term (current) use of non-steroidal anti-inflammatories (NSAID): Secondary | ICD-10-CM

## 2018-06-27 DIAGNOSIS — G9763 Postprocedural seroma of a nervous system organ or structure following a nervous system procedure: Secondary | ICD-10-CM

## 2018-06-27 DIAGNOSIS — M25432 Effusion, left wrist: Secondary | ICD-10-CM

## 2018-06-27 DIAGNOSIS — N200 Calculus of kidney: Secondary | ICD-10-CM | POA: Diagnosis present

## 2018-06-27 DIAGNOSIS — M5412 Radiculopathy, cervical region: Secondary | ICD-10-CM

## 2018-06-27 DIAGNOSIS — T40605A Adverse effect of unspecified narcotics, initial encounter: Secondary | ICD-10-CM | POA: Diagnosis not present

## 2018-06-27 DIAGNOSIS — A4101 Sepsis due to Methicillin susceptible Staphylococcus aureus: Principal | ICD-10-CM | POA: Diagnosis present

## 2018-06-27 DIAGNOSIS — K5903 Drug induced constipation: Secondary | ICD-10-CM | POA: Diagnosis not present

## 2018-06-27 DIAGNOSIS — M810 Age-related osteoporosis without current pathological fracture: Secondary | ICD-10-CM | POA: Diagnosis present

## 2018-06-27 DIAGNOSIS — G959 Disease of spinal cord, unspecified: Secondary | ICD-10-CM

## 2018-06-27 MED ORDER — OXYCODONE-ACETAMINOPHEN 5-325 MG PO TABS
1.0000 | ORAL_TABLET | Freq: Once | ORAL | Status: AC
  Administered 2018-06-27: 1 via ORAL
  Filled 2018-06-27: qty 1

## 2018-06-27 MED ORDER — OXYCODONE-ACETAMINOPHEN 5-325 MG PO TABS
2.0000 | ORAL_TABLET | Freq: Once | ORAL | Status: AC
  Administered 2018-06-27: 2 via ORAL
  Filled 2018-06-27: qty 2

## 2018-06-27 MED ORDER — KETOROLAC TROMETHAMINE 30 MG/ML IJ SOLN
60.0000 mg | Freq: Once | INTRAMUSCULAR | Status: AC
  Administered 2018-06-27: 60 mg via INTRAMUSCULAR
  Filled 2018-06-27: qty 2

## 2018-06-27 NOTE — ED Notes (Signed)
Contacted MRI regarding delay. Shared situation with pt.

## 2018-06-27 NOTE — ED Provider Notes (Signed)
[facility]-EMERGENCY DEPT Provider Note   CSN: [id number] Arrival date & time: 06/27/18  1318     History   Chief Complaint Chief Complaint  Patient presents with  . Fall  . Hip Pain    HPI [name] [name] is a 62 y.o. male.  HPI   He presents for evaluation of worsening right lower back pain, radiating to both legs as a numb feeling for the last several months.  Today he got out of bed and tried to walk but fell to the floor because of a sense of weakness in his right leg.  He states that he has "paralysis of both legs", for the last 2 days.  He denies fever, chills, nausea or vomiting.  He feels he worsened his low back pain when he fell today.  He denies bowel or bladder incontinence.  He had lumbar surgery, January 2019 and went to rehab afterwards.  He has been using a walker, to ambulate since that time.  There are no other known modifying factors.   Past Medical History:  Diagnosis Date  . Anemia   . Arthritis   . Chronic kidney disease    HX acute kidney failure / acute pyelonephritis / hydronephrosis / severe sepsis per discharge summary 12/27/13  . History of kidney stones   . Hypertension   . Morbid obesity (HCC)   . Obstructive sleep apnea    does not need c pap since 110 lb wt loss  . Osteoporosis   . Prurigo 2002  . Scars    ON ARMS FROM CHEMICAL EXPLOSION 1999  . Spinal stenosis     Patient Active Problem List   Diagnosis Date Noted  . Spinal stenosis of lumbar region 11/17/2017    Class: Chronic  . Status post lumbar laminectomy 11/17/2017  . Lumbar stenosis with neurogenic claudication 08/18/2017  . Forestier's disease of thoracolumbar region 08/18/2017  . Degenerative disc disease, lumbar 08/18/2017  . Lumbar radiculopathy 09/30/2016  . Morbid (severe) obesity due to excess calories (HCC) 09/30/2016  . Biliary calculus with acute cholecystitis 03/21/2016  . Acute pyelonephritis 12/28/2013  . Ureteral stone with hydronephrosis  12/28/2013  . Acute kidney failure (HCC) 12/27/2013  . Severe sepsis with acute organ dysfunction (HCC) 12/27/2013  . Hypotension 12/27/2013  . Lactic acidosis 12/27/2013  . Sepsis secondary to UTI (HCC) 12/27/2013  . EMPYEMA CHEST 08/30/2008  . PRURIGO 08/30/2008  . OBSTRUCTIVE SLEEP APNEA 08/30/2008    Past Surgical History:  Procedure Laterality Date  . BACK SURGERY  01/23/2010   lumbar  . CHOLECYSTECTOMY N/A 03/24/2016   Procedure: LAPAROSCOPIC CHOLECYSTECTOMY WITH INTRAOPERATIVE CHOLANGIOGRAM;  Surgeon: Chevis Pretty III, MD;  Location: [facility];  Service: General;  Laterality: N/A;  . CIRCUMCISION    . CYSTOSCOPY WITH RETROGRADE PYELOGRAM, URETEROSCOPY AND STENT PLACEMENT Left 01/20/2014   Procedure: CYSTOSCOPY WITH RETROGRADE PYELOGRAM, URETEROSCOPY AND STENT EXCHANGE;  Surgeon: Milford Cage, MD;  Location: [facility];  Service: Urology;  Laterality: Left;  bugbee bladder fulguration  . CYSTOSCOPY WITH STENT PLACEMENT Left 12/28/2013   Procedure: CYSTOSCOPY WITH STENT PLACEMENT left retrograde pyleogram;  Surgeon: Milford Cage, MD;  Location: [facility];  Service: Urology;  Laterality: Left;  . ESOPHAGOGASTRODUODENOSCOPY N/A 12/07/2012   Procedure: ESOPHAGOGASTRODUODENOSCOPY (EGD);  Surgeon: Lodema Pilot, DO;  Location: [facility];  Service: General;  Laterality: N/A;  . HOLMIUM LASER APPLICATION Left 01/20/2014   Procedure: HOLMIUM LASER APPLICATION;  Surgeon: Milford Cage, MD;  [facility]: [facility];  Service:  Urology;  Laterality: Left;  . LAPAROSCOPIC GASTRIC SLEEVE RESECTION N/A 12/07/2012   Procedure: LAPAROSCOPIC GASTRIC SLEEVE RESECTION;  Surgeon: Lodema PilotBrian Layton, DO;  Location: [facility];  Service: General;  Laterality: N/A;  laparoscopic sleeve gastrectomy with EGD  . LUMBAR LAMINECTOMY N/A 11/17/2017   Procedure: L2-3 LAMINECTOMY AND REDO LAMINECTOMIES  L3-4, L4-5 AND L5-S1;  Surgeon: Kerrin ChampagneNitka, James E, MD;  Location: [facility];  Service: Orthopedics;  Laterality: N/A;        Home  Medications    Prior to Admission medications   Medication Sig Start Date End Date Taking? Authorizing Provider  calcium citrate-vitamin D (CITRACAL+D) 315-200 MG-UNIT per tablet Take 1 tablet by mouth daily.    Yes [provider]  DULoxetine (CYMBALTA) 60 MG capsule TAKE 1 CAPSULE(60 MG) BY MOUTH DAILY 05/25/18  Yes Tyrell AntonioNewton, Frederic, MD  meloxicam (MOBIC) 15 MG tablet Take 1 tablet (15 mg total) by mouth daily. 02/16/18  Yes Kerrin ChampagneNitka, James E, MD  vitamin B-12 (CYANOCOBALAMIN) 1000 MCG tablet Take 1,000 mcg by mouth daily.   Yes [provider]    Family History History reviewed. No pertinent family history.  Social History Social History   Tobacco Use  . Smoking status: Former Smoker    Types: Cigarettes    Last attempt to quit: 04/09/1974    Years since quitting: 44.2  . Smokeless tobacco: Never Used  Substance Use Topics  . Alcohol use: No  . Drug use: No     Allergies   Unasyn [ampicillin-sulbactam sodium]   Review of Systems Review of Systems  All other systems reviewed and are negative.    Physical Exam Updated Vital Signs BP 127/70 (BP Location: Right Arm)   Pulse (!) 102   Temp 98.4 F (36.9 C) (Oral)   Resp 18   SpO2 98%   Physical Exam  Constitutional: He is oriented to person, place, and time. He appears well-developed and well-nourished.  HENT:  Head: Normocephalic and atraumatic.  Right Ear: External ear normal.  Left Ear: External ear normal.  Eyes: Pupils are equal, round, and reactive to light. Conjunctivae and EOM are normal.  Neck: Normal range of motion and phonation normal. Neck supple.  Cardiovascular: Normal rate, regular rhythm and normal heart sounds.  Pulmonary/Chest: Effort normal and breath sounds normal. He exhibits no bony tenderness.  Abdominal: Soft. There is no tenderness.  Musculoskeletal: Normal range of motion.  Decrease active mobility legs bilaterally with increase in low back pain with attempts to move legs.   No right hip deformity or localized tenderness at the right hip.  Neurological: He is alert and oriented to person, place, and time. No cranial nerve deficit. He exhibits normal muscle tone. Coordination normal.  Skin: Skin is warm, dry and intact.  Excoriations with superficial ulcers of the lower legs bilaterally.  Psychiatric: He has a normal mood and affect. His behavior is normal. Judgment and thought content normal.  Nursing note and vitals reviewed.    ED Treatments / Results  Labs (all labs ordered are listed, but only abnormal results are displayed) Labs Reviewed - No data to display  EKG None  Radiology No results found.  Procedures Procedures (including critical care time)  Medications Ordered in ED Medications  ketorolac (TORADOL) 30 MG/ML injection 60 mg (60 mg Intramuscular Given 06/27/18 1410)  oxyCODONE-acetaminophen (PERCOCET/ROXICET) 5-325 MG per tablet 1 tablet (1 tablet Oral Given 06/27/18 1410)     Initial Impression / Assessment and Plan / ED Course  I have  reviewed the triage vital signs and the nursing notes.  Pertinent labs & imaging results that were available during my care of the patient were reviewed by me and considered in my medical decision making (see chart for details).  Clinical Course as of Jun 27 1634  Sat Jun 27, 2018  1354 Initial evaluation is concerning for lumbar myelopathy therefore will evaluate with lumbar MR.   [EW]  1608 Attempt to do MRI lumbar, failed because the patient would not fit on the table.  We are proceeding to assess for possibility of doing the procedure at [facility], urgently.  Physical examination unchanged at this time.   [EW]  1634 Case discussed with Dr. Lockie Mola, at [facility] ED, he accepts patient in transfer.   [EW]    Clinical Course User Index [EW] Mancel Bale, MD     Patient Vitals for the past 24 hrs:  BP Temp Temp src Pulse Resp SpO2  06/27/18 1613 127/70 - - (!) 102 18 98 %    06/27/18 1602 - - - 100 - 98 %  06/27/18 1600 127/70 - - - - -  06/27/18 1400 (!) 152/88 - - (!) 114 16 96 %  06/27/18 1345 (!) 149/92 - - - - -  06/27/18 1330 136/66 - - - - -  06/27/18 1326 (!) 146/88 98.4 F (36.9 C) Oral (!) 108 18 100 %    4:35 PM Reevaluation with update and discussion. After initial assessment and treatment, an updated evaluation reveals patient remains fairly comfortable, he understands he needs transfer for MRI imaging to evaluate for the cause of his pain and disability. Mancel Bale   Medical Decision Making: Evaluation consistent with lumbar myelopathy, requiring urgent assessment with MRI imaging.  Patient is status post prior lumbar surgery.  CRITICAL CARE-no Performed by: Mancel Bale   Nursing Notes Reviewed/ Care Coordinated Applicable Imaging Reviewed Interpretation of Laboratory Data incorporated into ED treatment   Plan-transfer to [facility] emergency department for lumbar MRI imaging.   Final Clinical Impressions(s) / ED Diagnoses   Final diagnoses:  Lumbar myelopathy [facility])    ED Discharge Orders    None       Mancel Bale, MD 06/27/18 [phone]

## 2018-06-27 NOTE — ED Triage Notes (Signed)
Per [facility] here with mechanical fall but cc is hip pain starting Thursday. No other complaints present.

## 2018-06-27 NOTE — ED Notes (Signed)
Patient transported to MRI 

## 2018-06-27 NOTE — ED Provider Notes (Signed)
Sent for MRI back for bilateral LE myelopathy. Pain treated in ER. Nontoxic. Well appearing. MRI pending.  Care to Dr Manus Gunningancour @11 :33 PM    Azalia Bilisampos, Shelagh Rayman, MD 06/27/18 2333

## 2018-06-27 NOTE — ED Notes (Signed)
Bed: WA03 Expected date:  Expected time:  Means of arrival:  Comments: Fall

## 2018-06-28 ENCOUNTER — Inpatient Hospital Stay (HOSPITAL_COMMUNITY): Payer: No Typology Code available for payment source

## 2018-06-28 ENCOUNTER — Emergency Department (HOSPITAL_COMMUNITY): Payer: No Typology Code available for payment source

## 2018-06-28 DIAGNOSIS — M25511 Pain in right shoulder: Secondary | ICD-10-CM | POA: Diagnosis not present

## 2018-06-28 DIAGNOSIS — M25512 Pain in left shoulder: Secondary | ICD-10-CM

## 2018-06-28 DIAGNOSIS — M009 Pyogenic arthritis, unspecified: Secondary | ICD-10-CM | POA: Diagnosis present

## 2018-06-28 DIAGNOSIS — Z4789 Encounter for other orthopedic aftercare: Secondary | ICD-10-CM | POA: Diagnosis not present

## 2018-06-28 DIAGNOSIS — G4733 Obstructive sleep apnea (adult) (pediatric): Secondary | ICD-10-CM | POA: Diagnosis present

## 2018-06-28 DIAGNOSIS — G959 Disease of spinal cord, unspecified: Secondary | ICD-10-CM | POA: Diagnosis not present

## 2018-06-28 DIAGNOSIS — M25432 Effusion, left wrist: Secondary | ICD-10-CM | POA: Diagnosis not present

## 2018-06-28 DIAGNOSIS — M19012 Primary osteoarthritis, left shoulder: Secondary | ICD-10-CM | POA: Diagnosis present

## 2018-06-28 DIAGNOSIS — Z791 Long term (current) use of non-steroidal anti-inflammatories (NSAID): Secondary | ICD-10-CM | POA: Diagnosis not present

## 2018-06-28 DIAGNOSIS — R338 Other retention of urine: Secondary | ICD-10-CM

## 2018-06-28 DIAGNOSIS — M4714 Other spondylosis with myelopathy, thoracic region: Secondary | ICD-10-CM | POA: Diagnosis present

## 2018-06-28 DIAGNOSIS — R7881 Bacteremia: Secondary | ICD-10-CM | POA: Diagnosis not present

## 2018-06-28 DIAGNOSIS — D62 Acute posthemorrhagic anemia: Secondary | ICD-10-CM | POA: Diagnosis not present

## 2018-06-28 DIAGNOSIS — R339 Retention of urine, unspecified: Secondary | ICD-10-CM | POA: Diagnosis present

## 2018-06-28 DIAGNOSIS — R5381 Other malaise: Secondary | ICD-10-CM | POA: Diagnosis not present

## 2018-06-28 DIAGNOSIS — M4722 Other spondylosis with radiculopathy, cervical region: Secondary | ICD-10-CM | POA: Diagnosis present

## 2018-06-28 DIAGNOSIS — I10 Essential (primary) hypertension: Secondary | ICD-10-CM | POA: Diagnosis not present

## 2018-06-28 DIAGNOSIS — R531 Weakness: Secondary | ICD-10-CM | POA: Diagnosis present

## 2018-06-28 DIAGNOSIS — G8220 Paraplegia, unspecified: Secondary | ICD-10-CM | POA: Diagnosis present

## 2018-06-28 DIAGNOSIS — M00062 Staphylococcal arthritis, left knee: Secondary | ICD-10-CM | POA: Diagnosis not present

## 2018-06-28 DIAGNOSIS — N182 Chronic kidney disease, stage 2 (mild): Secondary | ICD-10-CM | POA: Diagnosis present

## 2018-06-28 DIAGNOSIS — Z87442 Personal history of urinary calculi: Secondary | ICD-10-CM | POA: Diagnosis not present

## 2018-06-28 DIAGNOSIS — G8929 Other chronic pain: Secondary | ICD-10-CM

## 2018-06-28 DIAGNOSIS — I129 Hypertensive chronic kidney disease with stage 1 through stage 4 chronic kidney disease, or unspecified chronic kidney disease: Secondary | ICD-10-CM | POA: Diagnosis present

## 2018-06-28 DIAGNOSIS — G894 Chronic pain syndrome: Secondary | ICD-10-CM | POA: Diagnosis not present

## 2018-06-28 DIAGNOSIS — N170 Acute kidney failure with tubular necrosis: Secondary | ICD-10-CM | POA: Diagnosis not present

## 2018-06-28 DIAGNOSIS — M75122 Complete rotator cuff tear or rupture of left shoulder, not specified as traumatic: Secondary | ICD-10-CM | POA: Diagnosis not present

## 2018-06-28 DIAGNOSIS — M4815 Ankylosing hyperostosis [Forestier], thoracolumbar region: Secondary | ICD-10-CM | POA: Diagnosis not present

## 2018-06-28 DIAGNOSIS — G061 Intraspinal abscess and granuloma: Secondary | ICD-10-CM | POA: Diagnosis present

## 2018-06-28 DIAGNOSIS — D72828 Other elevated white blood cell count: Secondary | ICD-10-CM

## 2018-06-28 DIAGNOSIS — M810 Age-related osteoporosis without current pathological fracture: Secondary | ICD-10-CM | POA: Diagnosis present

## 2018-06-28 DIAGNOSIS — D638 Anemia in other chronic diseases classified elsewhere: Secondary | ICD-10-CM | POA: Diagnosis not present

## 2018-06-28 DIAGNOSIS — E440 Moderate protein-calorie malnutrition: Secondary | ICD-10-CM | POA: Diagnosis not present

## 2018-06-28 DIAGNOSIS — Z9884 Bariatric surgery status: Secondary | ICD-10-CM | POA: Diagnosis not present

## 2018-06-28 DIAGNOSIS — A4101 Sepsis due to Methicillin susceptible Staphylococcus aureus: Secondary | ICD-10-CM | POA: Diagnosis present

## 2018-06-28 DIAGNOSIS — R0989 Other specified symptoms and signs involving the circulatory and respiratory systems: Secondary | ICD-10-CM | POA: Diagnosis not present

## 2018-06-28 DIAGNOSIS — M5412 Radiculopathy, cervical region: Secondary | ICD-10-CM | POA: Diagnosis not present

## 2018-06-28 DIAGNOSIS — M7989 Other specified soft tissue disorders: Secondary | ICD-10-CM | POA: Diagnosis not present

## 2018-06-28 DIAGNOSIS — M25462 Effusion, left knee: Secondary | ICD-10-CM | POA: Diagnosis not present

## 2018-06-28 DIAGNOSIS — Z23 Encounter for immunization: Secondary | ICD-10-CM | POA: Diagnosis not present

## 2018-06-28 DIAGNOSIS — Z881 Allergy status to other antibiotic agents status: Secondary | ICD-10-CM | POA: Diagnosis not present

## 2018-06-28 DIAGNOSIS — M5136 Other intervertebral disc degeneration, lumbar region: Secondary | ICD-10-CM | POA: Diagnosis present

## 2018-06-28 DIAGNOSIS — E876 Hypokalemia: Secondary | ICD-10-CM | POA: Diagnosis not present

## 2018-06-28 DIAGNOSIS — N179 Acute kidney failure, unspecified: Secondary | ICD-10-CM | POA: Diagnosis not present

## 2018-06-28 DIAGNOSIS — M19011 Primary osteoarthritis, right shoulder: Secondary | ICD-10-CM | POA: Diagnosis present

## 2018-06-28 DIAGNOSIS — K5900 Constipation, unspecified: Secondary | ICD-10-CM | POA: Diagnosis not present

## 2018-06-28 DIAGNOSIS — M4626 Osteomyelitis of vertebra, lumbar region: Secondary | ICD-10-CM | POA: Diagnosis not present

## 2018-06-28 DIAGNOSIS — Z9049 Acquired absence of other specified parts of digestive tract: Secondary | ICD-10-CM | POA: Diagnosis not present

## 2018-06-28 DIAGNOSIS — G9763 Postprocedural seroma of a nervous system organ or structure following a nervous system procedure: Secondary | ICD-10-CM

## 2018-06-28 DIAGNOSIS — D72829 Elevated white blood cell count, unspecified: Secondary | ICD-10-CM | POA: Diagnosis not present

## 2018-06-28 DIAGNOSIS — Z87891 Personal history of nicotine dependence: Secondary | ICD-10-CM | POA: Diagnosis not present

## 2018-06-28 DIAGNOSIS — Z6841 Body Mass Index (BMI) 40.0 and over, adult: Secondary | ICD-10-CM | POA: Diagnosis not present

## 2018-06-28 DIAGNOSIS — B9561 Methicillin susceptible Staphylococcus aureus infection as the cause of diseases classified elsewhere: Secondary | ICD-10-CM | POA: Diagnosis not present

## 2018-06-28 LAB — CBC WITH DIFFERENTIAL/PLATELET
BASOS PCT: 0 %
Basophils Absolute: 0 10*3/uL (ref 0.0–0.1)
EOS ABS: 0 10*3/uL (ref 0.0–0.7)
EOS PCT: 0 %
HEMATOCRIT: 44.5 % (ref 39.0–52.0)
HEMOGLOBIN: 14.2 g/dL (ref 13.0–17.0)
LYMPHS PCT: 2 %
Lymphs Abs: 0.3 10*3/uL — ABNORMAL LOW (ref 0.7–4.0)
MCH: 28.5 pg (ref 26.0–34.0)
MCHC: 31.9 g/dL (ref 30.0–36.0)
MCV: 89.4 fL (ref 78.0–100.0)
Monocytes Absolute: 0.7 10*3/uL (ref 0.1–1.0)
Monocytes Relative: 5 %
NEUTROS ABS: 12.5 10*3/uL — AB (ref 1.7–7.7)
Neutrophils Relative %: 93 %
Platelets: 137 10*3/uL — ABNORMAL LOW (ref 150–400)
RBC: 4.98 MIL/uL (ref 4.22–5.81)
RDW: 15.6 % — ABNORMAL HIGH (ref 11.5–15.5)
WBC: 13.5 10*3/uL — ABNORMAL HIGH (ref 4.0–10.5)

## 2018-06-28 LAB — URINALYSIS, ROUTINE W REFLEX MICROSCOPIC
BILIRUBIN URINE: NEGATIVE
Glucose, UA: NEGATIVE mg/dL
Ketones, ur: NEGATIVE mg/dL
Leukocytes, UA: NEGATIVE
Nitrite: NEGATIVE
PROTEIN: 30 mg/dL — AB
SPECIFIC GRAVITY, URINE: 1.025 (ref 1.005–1.030)
pH: 5 (ref 5.0–8.0)

## 2018-06-28 LAB — PROTIME-INR
INR: 1.05
Prothrombin Time: 13.7 seconds (ref 11.4–15.2)

## 2018-06-28 LAB — COMPREHENSIVE METABOLIC PANEL
ALT: 21 U/L (ref 0–44)
ANION GAP: 13 (ref 5–15)
AST: 47 U/L — ABNORMAL HIGH (ref 15–41)
Albumin: 3 g/dL — ABNORMAL LOW (ref 3.5–5.0)
Alkaline Phosphatase: 93 U/L (ref 38–126)
BUN: 26 mg/dL — ABNORMAL HIGH (ref 6–20)
CHLORIDE: 101 mmol/L (ref 98–111)
CO2: 27 mmol/L (ref 22–32)
Calcium: 9 mg/dL (ref 8.9–10.3)
Creatinine, Ser: 1.77 mg/dL — ABNORMAL HIGH (ref 0.61–1.24)
GFR calc non Af Amer: 40 mL/min — ABNORMAL LOW (ref >60)
GFR, EST AFRICAN AMERICAN: 46 mL/min — AB (ref >60)
Glucose, Bld: 147 mg/dL — ABNORMAL HIGH (ref 70–99)
Potassium: 4 mmol/L (ref 3.5–5.1)
SODIUM: 141 mmol/L (ref 135–145)
Total Bilirubin: 1.7 mg/dL — ABNORMAL HIGH (ref 0.3–1.2)
Total Protein: 6.9 g/dL (ref 6.5–8.1)

## 2018-06-28 LAB — SEDIMENTATION RATE: SED RATE: 56 mm/h — AB (ref 0–16)

## 2018-06-28 LAB — C-REACTIVE PROTEIN: CRP: 30.9 mg/dL — ABNORMAL HIGH (ref <1.0)

## 2018-06-28 LAB — PROCALCITONIN: Procalcitonin: 3.69 ng/mL

## 2018-06-28 MED ORDER — SODIUM CHLORIDE 0.9 % IV SOLN
INTRAVENOUS | Status: DC
  Administered 2018-06-28 – 2018-06-29 (×2): via INTRAVENOUS

## 2018-06-28 MED ORDER — FLEET ENEMA 7-19 GM/118ML RE ENEM: 1.0000 | ENEMA | Freq: Once | RECTAL | Status: DC | PRN

## 2018-06-28 MED ORDER — BISACODYL 5 MG PO TBEC: 5.0000 mg | DELAYED_RELEASE_TABLET | Freq: Every day | ORAL | Status: DC | PRN

## 2018-06-28 MED ORDER — MORPHINE SULFATE (PF) 2 MG/ML IV SOLN: 1.0000 mg | INTRAVENOUS | Status: DC | PRN

## 2018-06-28 MED ORDER — DOCUSATE SODIUM 100 MG PO CAPS
100.0000 mg | ORAL_CAPSULE | Freq: Two times a day (BID) | ORAL | Status: DC
  Administered 2018-06-29 – 2018-07-02 (×6): 100 mg via ORAL
  Filled 2018-06-28 (×7): qty 1

## 2018-06-28 MED ORDER — SODIUM CHLORIDE 0.9 % IV BOLUS
1000.0000 mL | Freq: Once | INTRAVENOUS | Status: AC
  Administered 2018-06-28: 1000 mL via INTRAVENOUS

## 2018-06-28 MED ORDER — ACETAMINOPHEN 650 MG RE SUPP: 650.0000 mg | Freq: Four times a day (QID) | RECTAL | Status: DC | PRN

## 2018-06-28 MED ORDER — MIDAZOLAM HCL 2 MG/2ML IJ SOLN
INTRAMUSCULAR | Status: AC
  Filled 2018-06-28: qty 6

## 2018-06-28 MED ORDER — FENTANYL CITRATE (PF) 100 MCG/2ML IJ SOLN
INTRAMUSCULAR | Status: AC
  Filled 2018-06-28: qty 4

## 2018-06-28 MED ORDER — METHOCARBAMOL 500 MG PO TABS
500.0000 mg | ORAL_TABLET | Freq: Four times a day (QID) | ORAL | Status: DC | PRN
  Administered 2018-06-28 – 2018-07-08 (×7): 500 mg via ORAL
  Filled 2018-06-28 (×7): qty 1

## 2018-06-28 MED ORDER — HYDROCODONE-ACETAMINOPHEN 7.5-325 MG PO TABS
1.0000 | ORAL_TABLET | Freq: Four times a day (QID) | ORAL | Status: DC
  Administered 2018-06-29 – 2018-07-04 (×18): 1 via ORAL
  Filled 2018-06-28 (×19): qty 1

## 2018-06-28 MED ORDER — SODIUM CHLORIDE 0.9 % IV SOLN
30.0000 mg/kg | Freq: Once | INTRAVENOUS | Status: AC
  Administered 2018-06-28: 2190 mg via INTRAVENOUS
  Filled 2018-06-28: qty 17.52

## 2018-06-28 MED ORDER — OXYCODONE-ACETAMINOPHEN 5-325 MG PO TABS: 2.0000 | ORAL_TABLET | Freq: Once | ORAL | Status: DC

## 2018-06-28 MED ORDER — METHOCARBAMOL 1000 MG/10ML IJ SOLN
500.0000 mg | Freq: Four times a day (QID) | INTRAVENOUS | Status: DC | PRN
  Filled 2018-06-28: qty 5

## 2018-06-28 MED ORDER — SODIUM CHLORIDE 0.9 % IV SOLN
5.4000 mg/kg/h | INTRAVENOUS | Status: DC
  Administered 2018-06-28: 5.4 mg/kg/h via INTRAVENOUS
  Filled 2018-06-28 (×2): qty 40

## 2018-06-28 MED ORDER — DIPHENHYDRAMINE HCL 12.5 MG/5ML PO ELIX: 12.5000 mg | ORAL_SOLUTION | ORAL | Status: DC | PRN

## 2018-06-28 MED ORDER — ZOLPIDEM TARTRATE 5 MG PO TABS: 5.0000 mg | ORAL_TABLET | Freq: Every evening | ORAL | Status: DC | PRN

## 2018-06-28 MED ORDER — ONDANSETRON HCL 4 MG PO TABS: 4.0000 mg | ORAL_TABLET | Freq: Four times a day (QID) | ORAL | Status: DC | PRN

## 2018-06-28 MED ORDER — FENTANYL CITRATE (PF) 100 MCG/2ML IJ SOLN
50.0000 ug | Freq: Once | INTRAMUSCULAR | Status: AC
  Administered 2018-06-28: 50 ug via INTRAVENOUS
  Filled 2018-06-28: qty 2

## 2018-06-28 MED ORDER — LIDOCAINE HCL 1 % IJ SOLN
INTRAMUSCULAR | Status: AC
  Filled 2018-06-28: qty 20

## 2018-06-28 MED ORDER — ACETAMINOPHEN 325 MG PO TABS: 650.0000 mg | ORAL_TABLET | Freq: Four times a day (QID) | ORAL | Status: DC | PRN

## 2018-06-28 MED ORDER — SODIUM CHLORIDE 0.9 % IV SOLN
INTRAVENOUS | Status: DC
  Administered 2018-06-28 – 2018-07-05 (×9): via INTRAVENOUS

## 2018-06-28 MED ORDER — HYDROCODONE-ACETAMINOPHEN 10-325 MG PO TABS
1.0000 | ORAL_TABLET | ORAL | Status: DC | PRN
  Administered 2018-06-28 – 2018-07-02 (×3): 1 via ORAL
  Filled 2018-06-28 (×3): qty 1

## 2018-06-28 MED ORDER — INFLUENZA VAC SPLIT QUAD 0.5 ML IM SUSY
0.5000 mL | PREFILLED_SYRINGE | INTRAMUSCULAR | Status: AC
  Administered 2018-06-29: 0.5 mL via INTRAMUSCULAR
  Filled 2018-06-28: qty 0.5

## 2018-06-28 MED ORDER — ACETAMINOPHEN 500 MG PO TABS
1000.0000 mg | ORAL_TABLET | Freq: Four times a day (QID) | ORAL | Status: DC
  Administered 2018-06-28 – 2018-07-09 (×26): 1000 mg via ORAL
  Filled 2018-06-28 (×31): qty 2

## 2018-06-28 MED ORDER — ONDANSETRON HCL 4 MG/2ML IJ SOLN
4.0000 mg | Freq: Four times a day (QID) | INTRAMUSCULAR | Status: DC | PRN
  Administered 2018-06-29: 4 mg via INTRAVENOUS

## 2018-06-28 MED ORDER — FENTANYL CITRATE (PF) 100 MCG/2ML IJ SOLN
INTRAMUSCULAR | Status: AC | PRN
  Administered 2018-06-28 (×3): 50 ug via INTRAVENOUS

## 2018-06-28 MED ORDER — POLYETHYLENE GLYCOL 3350 17 G PO PACK: 17.0000 g | PACK | Freq: Every day | ORAL | Status: DC | PRN

## 2018-06-28 NOTE — Consult Note (Signed)
Chief Complaint: Patient was seen in consultation today for epidural abscess of lumbar spine.  Referring Physician(s): Kerrin Champagne  Supervising Physician: Simonne Come  Patient Status: [facility] - In-pt  History of Present Illness: [name] [name] is a 62 y.o. male with a past medical history of hypertension, nephrolithiasis, CKD, osteoporosis, spinal stenosis, arthritis, OSA, morbid obesity, and anemia. He presented to ED 06/27/2018 with complaint of fall and hip pain. He was found to have a epidural abscess of lumbar spine and is being admitted for management.  MRI thoracic/lumbar spine 06/28/2018: 1. No fracture, malalignment or acute osseous process. 2. Subcentimeter cord edema T7-8, less likely myelomalacia. 12 mm segment suspected myelomalacia T9-10. No syrinx. 3. Degenerative change of the lumbar spine resulting in moderate canal stenosis T9-10 and T10-11. Multilevel mild canal stenosis. 4. Multilevel neural foraminal narrowing: Severe at T2-3, T9-10 and T10-11. 5. Moderate to severe paraspinal muscle atrophy. 6. Habitus and motion degraded examination. Similar grade 1 L2-3 retrolisthesis and grade 1 L4-5 anterolisthesis. No fracture. 7. L2-3 through L4-5 laminectomies. 2.9 x 4.3 x 6.1 cm fluid collection along surgical approach contiguous with RIGHT L4-5 facet effusion, septic arthropathy possible. Consider sampling. 8. Moderate to severe suspected canal stenosis L4-5 impart due to fluid collection, potentially overestimated. Moderate canal stenosis L5-S1. 9. Neural foraminal narrowing all lumbar levels: Severe at L4-5. 10. Severe paraspinal muscle atrophy, early LEFT psoas muscle atrophy.  IR requested by Dr. Otelia Sergeant for possible image-guided lumbar 4-5 aspiration. Patient awake and alert laying in bed. Complains of low back pain, rated 5/10 at this time. Denies fever, chills, chest pain, dyspnea, abdominal pain, headache, or dizziness.   Past Medical History:  Diagnosis Date  .  Anemia   . Arthritis   . Chronic kidney disease    HX acute kidney failure / acute pyelonephritis / hydronephrosis / severe sepsis per discharge summary 12/27/13  . History of kidney stones   . Hypertension   . Morbid obesity (HCC)   . Obstructive sleep apnea    does not need c pap since 110 lb wt loss  . Osteoporosis   . Prurigo 2002  . Scars    ON ARMS FROM CHEMICAL EXPLOSION 1999  . Spinal stenosis     Past Surgical History:  Procedure Laterality Date  . BACK SURGERY  01/23/2010   lumbar  . CHOLECYSTECTOMY N/A 03/24/2016   Procedure: LAPAROSCOPIC CHOLECYSTECTOMY WITH INTRAOPERATIVE CHOLANGIOGRAM;  Surgeon: Chevis Pretty III, MD;  Location: [facility];  Service: General;  Laterality: N/A;  . CIRCUMCISION    . CYSTOSCOPY WITH RETROGRADE PYELOGRAM, URETEROSCOPY AND STENT PLACEMENT Left 01/20/2014   Procedure: CYSTOSCOPY WITH RETROGRADE PYELOGRAM, URETEROSCOPY AND STENT EXCHANGE;  Surgeon: Milford Cage, MD;  Location: [facility];  Service: Urology;  Laterality: Left;  bugbee bladder fulguration  . CYSTOSCOPY WITH STENT PLACEMENT Left 12/28/2013   Procedure: CYSTOSCOPY WITH STENT PLACEMENT left retrograde pyleogram;  Surgeon: Milford Cage, MD;  Location: [facility];  Service: Urology;  Laterality: Left;  . ESOPHAGOGASTRODUODENOSCOPY N/A 12/07/2012   Procedure: ESOPHAGOGASTRODUODENOSCOPY (EGD);  Surgeon: Lodema Pilot, DO;  Location: [facility];  Service: General;  Laterality: N/A;  . HOLMIUM LASER APPLICATION Left 01/20/2014   Procedure: HOLMIUM LASER APPLICATION;  Surgeon: Milford Cage, MD;  Location: [facility];  Service: Urology;  Laterality: Left;  . LAPAROSCOPIC GASTRIC SLEEVE RESECTION N/A 12/07/2012   Procedure: LAPAROSCOPIC GASTRIC SLEEVE RESECTION;  Surgeon: Lodema Pilot, DO;  Location: [facility];  Service: General;  Laterality: N/A;  laparoscopic sleeve gastrectomy  with EGD  . LUMBAR LAMINECTOMY N/A 11/17/2017   Procedure: L2-3 LAMINECTOMY AND REDO LAMINECTOMIES  L3-4, L4-5 AND L5-S1;   Surgeon: Kerrin Champagne, MD;  Location: [facility];  Service: Orthopedics;  Laterality: N/A;    Allergies: Unasyn [ampicillin-sulbactam sodium]  Medications: Prior to Admission medications   Medication Sig Start Date End Date Taking? Authorizing Provider  calcium citrate-vitamin D (CITRACAL+D) 315-200 MG-UNIT per tablet Take 1 tablet by mouth daily.    Yes [provider]  DULoxetine (CYMBALTA) 60 MG capsule TAKE 1 CAPSULE(60 MG) BY MOUTH DAILY 05/25/18  Yes Tyrell Antonio, MD  meloxicam (MOBIC) 15 MG tablet Take 1 tablet (15 mg total) by mouth daily. 02/16/18  Yes Kerrin Champagne, MD  vitamin B-12 (CYANOCOBALAMIN) 1000 MCG tablet Take 1,000 mcg by mouth daily.   Yes [provider]     History reviewed. No pertinent family history.  Social History   Socioeconomic History  . Marital status: Married    Spouse name: Not on file  . Number of children: Not on file  . Years of education: Not on file  . Highest education level: Not on file  Occupational History  . Not on file  Social Needs  . Financial resource strain: Not on file  . Food insecurity:    Worry: Not on file    Inability: Not on file  . Transportation needs:    Medical: Not on file    Non-medical: Not on file  Tobacco Use  . Smoking status: Former Smoker    Types: Cigarettes    Last attempt to quit: 04/09/1974    Years since quitting: 44.2  . Smokeless tobacco: Never Used  Substance and Sexual Activity  . Alcohol use: No  . Drug use: No  . Sexual activity: Not on file  Lifestyle  . Physical activity:    Days per week: Not on file    Minutes per session: Not on file  . Stress: Not on file  Relationships  . Social connections:    Talks on phone: Not on file    Gets together: Not on file    Attends religious service: Not on file    Active member of club or organization: Not on file    Attends meetings of clubs or organizations: Not on file    Relationship status: Not on file  Other Topics  Concern  . Not on file  Social History Narrative  . Not on file     Review of Systems: A 12 point ROS discussed and pertinent positives are indicated in the HPI above.  All other systems are negative.  Review of Systems  Constitutional: Negative for chills and fever.  Respiratory: Negative for shortness of breath and wheezing.   Cardiovascular: Negative for chest pain and palpitations.  Gastrointestinal: Negative for abdominal pain.  Musculoskeletal: Positive for back pain.  Neurological: Negative for dizziness and headaches.  Psychiatric/Behavioral: Negative for behavioral problems and confusion.    Vital Signs: BP (!) 170/95   Pulse 89   Temp 98 F (36.7 C) (Oral)   Resp 18   SpO2 100%   Physical Exam  Constitutional: He is oriented to person, place, and time. He appears well-developed and well-nourished. No distress.  Cardiovascular: Normal rate, regular rhythm and normal heart sounds.  No murmur heard. Pulmonary/Chest: Effort normal and breath sounds normal. No respiratory distress. He has no wheezes.  Musculoskeletal:  Mild-moderate tenderness of midline lower spine.  Neurological: He is alert and oriented to  person, place, and time.  Skin: Skin is warm and dry.  Psychiatric: He has a normal mood and affect. His behavior is normal. Judgment and thought content normal.  Nursing note and vitals reviewed.    MD Evaluation Airway: WNL Heart: WNL Abdomen: WNL Chest/ Lungs: WNL ASA  Classification: 3 Mallampati/Airway Score: One   Imaging: Mr Thoracic Spine Wo Contrast  Result Date: 06/28/2018 CLINICAL DATA:  Worsening low back pain radiating to bilateral lower extremity with numbness for several months. RIGHT leg weakness today, bilateral lower extremity paralysis for 2 days. Status post lumbar spine surgery February 2019. EXAM: MRI THORACIC AND LUMBAR SPINE WITHOUT CONTRAST TECHNIQUE: Multiplanar and multiecho pulse sequences of the thoracic and lumbar spine were  obtained without intravenous contrast. COMPARISON:  MRI lumbar spine July 29, 2017 FINDINGS: MRI THORACIC SPINE FINDINGS ALIGNMENT: Maintenance of the thoracic kyphosis. No malalignment. VERTEBRAE/DISCS: Vertebral bodies are intact. Severe disc height loss T3-4 through T 910 with diffuse disc desiccation and moderate subacute discogenic endplate changes. No acute or abnormal bone marrow signal. Borderline congenital canal narrowing on the basis of foreshortened pedicles. CORD: Subcentimeter holo cord T2 bright signal at T7-8. 12 mm segment T2 bright signal spinal cord at T9-10, potential myelomalacia. No syrinx. PREVERTEBRAL AND PARASPINAL SOFT TISSUES: Symmetric moderate to severe paraspinal muscle atrophy. DISC LEVELS: T1-2: No disc bulge. Mild facet arthropathy. No canal stenosis. Mild neural foraminal narrowing. T2-3: Small broad-based disc bulge with superimposed 4 mm LEFT central disc protrusion. Mild facet arthropathy. Moderate canal stenosis. Severe RIGHT and moderate LEFT neural foraminal narrowing. T3-4, T4-5: No disc bulge, canal stenosis nor neural foraminal narrowing. T5-6: Bilateral subarticular disc protrusions measuring to 5 mm on the LEFT. Mild facet arthropathy. Mild canal stenosis. Moderate to severe RIGHT and moderate LEFT neural foraminal narrowing. T6-7: Small broad-based disc bulge minimal facet arthropathy. No canal stenosis. Moderate RIGHT neural foraminal narrowing. T7-8: 3 mm broad-based disc bulge. Mild facet arthropathy. Mild canal stenosis. Mild neural foraminal narrowing. T8-9: Small RIGHT subarticular disc protrusion. No canal stenosis. Moderate RIGHT and mild LEFT neural foraminal narrowing. T9-10: 5 mm broad-based disc bulge, moderate facet arthropathy. Moderate canal stenosis. Severe bilateral neural foraminal narrowing. T10-11: 3 mm broad-based disc bulge. Severe facet arthropathy. Moderate canal stenosis. Severe bilateral neural foraminal narrowing. T11-12: Annular bulging.  Mild facet arthropathy. No canal stenosis or neural foraminal narrowing. T12-L1: No disc bulge. Moderate facet arthropathy without canal stenosis. Mild LEFT neural foraminal narrowing. MRI LUMBAR SPINE FINDINGS-large body habitus results in decreased signal to noise ratio, mild motion degraded examination. SEGMENTATION: For the purposes of this report, the last well-formed intervertebral disc is reported as L5-S1. ALIGNMENT: Maintained lumbar lordosis. Similar minimal grade 1 L5-S1 anterolisthesis. Grade 1 L2-3 retrolisthesis. VERTEBRAE:Vertebral bodies are intact. Intervertebral discs demonstrate normal morphology, mild desiccation and superimposed mild disc edema lower lumbar discs. Moderate subacute discogenic endplate changes L5-S1. Multilevel mild subacute to chronic discogenic endplate changes. No suspicious bone marrow signal. CONUS MEDULLARIS AND CAUDA EQUINA: Conus medullaris terminates at L1-2 and demonstrates normal signal. Cauda equina not discretely identified. PARASPINAL AND OTHER SOFT TISSUES: Severe paraspinal muscle denervation/atrophy. Asymmetric LEFT distal iliopsoas muscle atrophy. 2.9 x 4.3 x 6.1 cm central paraspinal fluid collection L4 through S1 along surgical approach. DISC LEVELS: L1-2: Small broad-based disc bulge. Moderate RIGHT and moderate to severe LEFT facet arthropathy. No canal stenosis. Mild LEFT neural foraminal narrowing. L2-3: Retrolisthesis. Small broad-based disc bulge. Status post LEFT hemilaminectomy. Moderate facet arthropathy without canal stenosis. Moderate bilateral neural foraminal narrowing. L3-4:  No disc bulge. LEFT and possible RIGHT laminectomies. Severe facet arthropathy. No canal stenosis. Mild neural foraminal narrowing. L4-5: LEFT and possibly RIGHT laminectomies. Severe facet arthropathy with RIGHT facet effusion contiguous with paraspinal fluid collection. RIGHT facet is widened to 3 mm. 5 mm central disc protrusion versus extrusion. Moderate to severe canal  stenosis predominately due to fluid collection suspected though, this may be overestimated. Severe bilateral neural foraminal narrowing. L5-S1: Anterolisthesis. Annular bulging. Severe facet arthropathy. Moderate canal stenosis. Moderate to severe bilateral neural foraminal narrowing. IMPRESSION: MR THORACIC SPINE IMPRESSION 1. No fracture, malalignment or acute osseous process. 2. Subcentimeter cord edema T7-8, less likely myelomalacia. 12 mm segment suspected myelomalacia T9-10. No syrinx. 3. Degenerative change of the lumbar spine resulting in moderate canal stenosis T9-10 and T10-11. Multilevel mild canal stenosis. 4. Multilevel neural foraminal narrowing: Severe at T2-3, T9-10 and T10-11. 5. Moderate to severe paraspinal muscle atrophy. MR LUMBAR SPINE IMPRESSION 1. Habitus and motion degraded examination. Similar grade 1 L2-3 retrolisthesis and grade 1 L4-5 anterolisthesis. No fracture. 2. L2-3 through L4-5 laminectomies. 2.9 x 4.3 x 6.1 cm fluid collection along surgical approach contiguous with RIGHT L4-5 facet effusion, septic arthropathy possible. Consider sampling. 3. Moderate to severe suspected canal stenosis L4-5 impart due to fluid collection, potentially overestimated. Moderate canal stenosis L5-S1. 4. Neural foraminal narrowing all lumbar levels: Severe at L4-5. 5. Severe paraspinal muscle atrophy, early LEFT psoas muscle atrophy. Electronically Signed   By: Awilda Metroourtnay  Bloomer M.D.   On: 06/28/2018 03:10   Mr Lumbar Spine Wo Contrast  Result Date: 06/28/2018 CLINICAL DATA:  Worsening low back pain radiating to bilateral lower extremity with numbness for several months. RIGHT leg weakness today, bilateral lower extremity paralysis for 2 days. Status post lumbar spine surgery February 2019. EXAM: MRI THORACIC AND LUMBAR SPINE WITHOUT CONTRAST TECHNIQUE: Multiplanar and multiecho pulse sequences of the thoracic and lumbar spine were obtained without intravenous contrast. COMPARISON:  MRI lumbar  spine July 29, 2017 FINDINGS: MRI THORACIC SPINE FINDINGS ALIGNMENT: Maintenance of the thoracic kyphosis. No malalignment. VERTEBRAE/DISCS: Vertebral bodies are intact. Severe disc height loss T3-4 through T 910 with diffuse disc desiccation and moderate subacute discogenic endplate changes. No acute or abnormal bone marrow signal. Borderline congenital canal narrowing on the basis of foreshortened pedicles. CORD: Subcentimeter holo cord T2 bright signal at T7-8. 12 mm segment T2 bright signal spinal cord at T9-10, potential myelomalacia. No syrinx. PREVERTEBRAL AND PARASPINAL SOFT TISSUES: Symmetric moderate to severe paraspinal muscle atrophy. DISC LEVELS: T1-2: No disc bulge. Mild facet arthropathy. No canal stenosis. Mild neural foraminal narrowing. T2-3: Small broad-based disc bulge with superimposed 4 mm LEFT central disc protrusion. Mild facet arthropathy. Moderate canal stenosis. Severe RIGHT and moderate LEFT neural foraminal narrowing. T3-4, T4-5: No disc bulge, canal stenosis nor neural foraminal narrowing. T5-6: Bilateral subarticular disc protrusions measuring to 5 mm on the LEFT. Mild facet arthropathy. Mild canal stenosis. Moderate to severe RIGHT and moderate LEFT neural foraminal narrowing. T6-7: Small broad-based disc bulge minimal facet arthropathy. No canal stenosis. Moderate RIGHT neural foraminal narrowing. T7-8: 3 mm broad-based disc bulge. Mild facet arthropathy. Mild canal stenosis. Mild neural foraminal narrowing. T8-9: Small RIGHT subarticular disc protrusion. No canal stenosis. Moderate RIGHT and mild LEFT neural foraminal narrowing. T9-10: 5 mm broad-based disc bulge, moderate facet arthropathy. Moderate canal stenosis. Severe bilateral neural foraminal narrowing. T10-11: 3 mm broad-based disc bulge. Severe facet arthropathy. Moderate canal stenosis. Severe bilateral neural foraminal narrowing. T11-12: Annular bulging. Mild facet arthropathy. No canal stenosis or  neural foraminal  narrowing. T12-L1: No disc bulge. Moderate facet arthropathy without canal stenosis. Mild LEFT neural foraminal narrowing. MRI LUMBAR SPINE FINDINGS-large body habitus results in decreased signal to noise ratio, mild motion degraded examination. SEGMENTATION: For the purposes of this report, the last well-formed intervertebral disc is reported as L5-S1. ALIGNMENT: Maintained lumbar lordosis. Similar minimal grade 1 L5-S1 anterolisthesis. Grade 1 L2-3 retrolisthesis. VERTEBRAE:Vertebral bodies are intact. Intervertebral discs demonstrate normal morphology, mild desiccation and superimposed mild disc edema lower lumbar discs. Moderate subacute discogenic endplate changes L5-S1. Multilevel mild subacute to chronic discogenic endplate changes. No suspicious bone marrow signal. CONUS MEDULLARIS AND CAUDA EQUINA: Conus medullaris terminates at L1-2 and demonstrates normal signal. Cauda equina not discretely identified. PARASPINAL AND OTHER SOFT TISSUES: Severe paraspinal muscle denervation/atrophy. Asymmetric LEFT distal iliopsoas muscle atrophy. 2.9 x 4.3 x 6.1 cm central paraspinal fluid collection L4 through S1 along surgical approach. DISC LEVELS: L1-2: Small broad-based disc bulge. Moderate RIGHT and moderate to severe LEFT facet arthropathy. No canal stenosis. Mild LEFT neural foraminal narrowing. L2-3: Retrolisthesis. Small broad-based disc bulge. Status post LEFT hemilaminectomy. Moderate facet arthropathy without canal stenosis. Moderate bilateral neural foraminal narrowing. L3-4: No disc bulge. LEFT and possible RIGHT laminectomies. Severe facet arthropathy. No canal stenosis. Mild neural foraminal narrowing. L4-5: LEFT and possibly RIGHT laminectomies. Severe facet arthropathy with RIGHT facet effusion contiguous with paraspinal fluid collection. RIGHT facet is widened to 3 mm. 5 mm central disc protrusion versus extrusion. Moderate to severe canal stenosis predominately due to fluid collection suspected  though, this may be overestimated. Severe bilateral neural foraminal narrowing. L5-S1: Anterolisthesis. Annular bulging. Severe facet arthropathy. Moderate canal stenosis. Moderate to severe bilateral neural foraminal narrowing. IMPRESSION: MR THORACIC SPINE IMPRESSION 1. No fracture, malalignment or acute osseous process. 2. Subcentimeter cord edema T7-8, less likely myelomalacia. 12 mm segment suspected myelomalacia T9-10. No syrinx. 3. Degenerative change of the lumbar spine resulting in moderate canal stenosis T9-10 and T10-11. Multilevel mild canal stenosis. 4. Multilevel neural foraminal narrowing: Severe at T2-3, T9-10 and T10-11. 5. Moderate to severe paraspinal muscle atrophy. MR LUMBAR SPINE IMPRESSION 1. Habitus and motion degraded examination. Similar grade 1 L2-3 retrolisthesis and grade 1 L4-5 anterolisthesis. No fracture. 2. L2-3 through L4-5 laminectomies. 2.9 x 4.3 x 6.1 cm fluid collection along surgical approach contiguous with RIGHT L4-5 facet effusion, septic arthropathy possible. Consider sampling. 3. Moderate to severe suspected canal stenosis L4-5 impart due to fluid collection, potentially overestimated. Moderate canal stenosis L5-S1. 4. Neural foraminal narrowing all lumbar levels: Severe at L4-5. 5. Severe paraspinal muscle atrophy, early LEFT psoas muscle atrophy. Electronically Signed   By: Awilda Metro M.D.   On: 06/28/2018 03:10    Labs:  CBC: Recent Labs    11/17/17 0625 11/18/17 0609 06/28/18 0002  WBC 4.0 7.4 13.5*  HGB 14.4 11.6* 14.2  HCT 43.6 36.0* 44.5  PLT 220 192 137*    COAGS: Recent Labs    11/13/17 1512  INR 1.00  APTT 28    BMP: Recent Labs    11/13/17 1512 11/18/17 0609 06/28/18 0002  NA 139 139 141  K 3.6 3.7 4.0  CL 104 104 101  CO2 25 24 27   GLUCOSE 97 108* 147*  BUN 20 25* 26*  CALCIUM 8.7* 8.6* 9.0  CREATININE 1.02 1.33* 1.77*  GFRNONAA >60 57* 40*  GFRAA >60 >60 46*    LIVER FUNCTION TESTS: Recent Labs     11/13/17 1512 06/28/18 0002  BILITOT 0.8 1.7*  AST 27 47*  ALT 14* 21  ALKPHOS 101 93  PROT 7.7 6.9  ALBUMIN 3.7 3.0*    TUMOR MARKERS: No results for input(s): AFPTM, CEA, CA199, CHROMGRNA in the last 8760 hours.  Assessment and Plan:  Epidural abscess of lumbar spine. Plan for image-guided lumbar 4-5 aspiration today with Dr. Grace Isaac. Patient just ate- ok to proceed with just Fentanyl per Dr. Grace Isaac. Denies fever. He does not take blood thinners. INR pending.  Risks and benefits discussed with the patient including, but not limited to bleeding, infection, damage to adjacent structures or low yield requiring additional tests. All of the patient's questions were answered, patient is agreeable to proceed. Consent signed and in chart.   Thank you for this interesting consult.  I greatly enjoyed meeting [name] [name] and look forward to participating in their care.  A copy of this report was sent to the requesting provider on this date.  Electronically Signed: Elwin Mocha, PA-C 06/28/2018, 1:34 PM   I spent a total of 20 Minutes in face to face in clinical consultation, greater than 50% of which was counseling/coordinating care for epidural abscess of lumbar spine.

## 2018-06-28 NOTE — Consult Note (Addendum)
Reason for Consult:Weakness both lower extremities. Referring Physician: Dr. Zenia Resides,  Zollie Beckers, MD.  Consulting Physician:James Louanne Skye, MD  Orthopedic Diagnosis:1)Flaccid paralysis both lower extremities, left arm weakness C5, C6. Thoracic myelopathy, MRI thoracic spine with intense T2 holocord fingings at T9-10.  Chronic lumbar radiculopathy. Clinically worsening lower extremity weakness and left arm weakness new in  C5, C6 pattern.  S/P redo multilevel lumbar central laminectomies L2-3, L3-4, L4-5 and L5-S1 12/06/2017. Fluid collection at the L4-5 level may represent infection or seroma. Clinically no warm or fluctuance over the lumbar area, no significant, lumbosacral  tenderness.  2)History of Urosepsis 2015 with nephrolithiasis, need to assess for UTI, may have both UTI and lumbar infection though the lumbar spine clinically is benign. Last void was early this AM ?2 AM,  No void since then and no urge to void. 3)Left knee effusion, bilateral knee osteoarthritis and bilateral hip osteoarthritis. 4) Morbid Obesity There is no height or weight on file to calculate BMI.  5)Renal insufficiency, Cr 1.7, reports poor po intake past 2 days.  6) Bilateral shoulder osteoarthritis.   YHC:WCBJSE Kishi is an 62 y.o. male.History of lumbar multilevel central laminectomy for recurrent lumbar spinal Stenosis 11/2107, surgery L2-3 through L5-S1. Previous lumbar laminectomy L4-5 and L5-S1 in the distance past. He had severe weakness preop in both legs, with right foot drop. He has used a walker for short distances up to 20 feet And a wheel chair in his house. The electric scooter in the grocery shop. He has had multiple falls over the past one  Year the most recent occurring this past week. He has severe bilateral knee and hip osteoarthritis which also limits his Ability to stand and walk. Due to his size he is not considered a surgical candidate for knee or hip replacement. He underwent Previous  bariatric surgery and lost nearly 100 lbs but had regained much of the weight he lost. He underwent PT following  Surgery and reported only modest improvement in his ability to ambulate. 2 days ago he began experiencing worsening  Weakness in his legs and difficulty getting out of bed due to legs being weak. He reports that he took ibuprofen and mobic And had some relief of pain but remained in bed most of Friday 2 days ago. Yesterday he reports that he attempted to get up Out of bed and found the weakness in his legs to be profound. He is having back pain at the L-S junction. He has been seen Over the past 2-3 months by Dr. Marlou Sa for bilateral shoulder pain and weakness and has severe osteoarthritis of both shoulders. Dr. Marlou Sa is considering a shoulder replacement but is concerned due to his size and obvious dependency of his arms for  Assisting in transfers. MR. Deloatch has noticed weakness in the left arm and numbness since the most recent fall this\ Past week. No chills, but felt like he could have a fever, did not check with thermometer. Here he is afebrile, no history of Diabetes.      Past Medical History:  Diagnosis Date  . Anemia   . Arthritis   . Chronic kidney disease    HX acute kidney failure / acute pyelonephritis / hydronephrosis / severe sepsis per discharge summary 12/27/13  . History of kidney stones   . Hypertension   . Morbid obesity ([place])   . Obstructive sleep apnea    does not need c pap since 110 lb wt loss  . Osteoporosis   .  Prurigo 2002  . Scars    ON ARMS FROM CHEMICAL EXPLOSION 1999  . Spinal stenosis     Past Surgical History:  Procedure Laterality Date  . BACK SURGERY  01/23/2010   lumbar  . CHOLECYSTECTOMY N/A 03/24/2016   Procedure: LAPAROSCOPIC CHOLECYSTECTOMY WITH INTRAOPERATIVE CHOLANGIOGRAM;  Surgeon: Autumn Messing III, MD;  Location: [facility];  Service: General;  Laterality: N/A;  . CIRCUMCISION    . CYSTOSCOPY WITH RETROGRADE PYELOGRAM, URETEROSCOPY AND  STENT PLACEMENT Left 01/20/2014   Procedure: CYSTOSCOPY WITH RETROGRADE PYELOGRAM, URETEROSCOPY AND STENT EXCHANGE;  Surgeon: Molli Hazard, MD;  Location: [facility];  Service: Urology;  Laterality: Left;  bugbee bladder fulguration  . CYSTOSCOPY WITH STENT PLACEMENT Left 12/28/2013   Procedure: CYSTOSCOPY WITH STENT PLACEMENT left retrograde pyleogram;  Surgeon: Molli Hazard, MD;  Location: [facility];  Service: Urology;  Laterality: Left;  . ESOPHAGOGASTRODUODENOSCOPY N/A 12/07/2012   Procedure: ESOPHAGOGASTRODUODENOSCOPY (EGD);  Surgeon: Madilyn Hook, DO;  Location: [facility];  Service: General;  Laterality: N/A;  . HOLMIUM LASER APPLICATION Left 03/19/2946   Procedure: HOLMIUM LASER APPLICATION;  Surgeon: Molli Hazard, MD;  Location: [facility];  Service: Urology;  Laterality: Left;  . LAPAROSCOPIC GASTRIC SLEEVE RESECTION N/A 12/07/2012   Procedure: LAPAROSCOPIC GASTRIC SLEEVE RESECTION;  Surgeon: Madilyn Hook, DO;  Location: [facility];  Service: General;  Laterality: N/A;  laparoscopic sleeve gastrectomy with EGD  . LUMBAR LAMINECTOMY N/A 11/17/2017   Procedure: L2-3 LAMINECTOMY AND REDO LAMINECTOMIES  L3-4, L4-5 AND L5-S1;  Surgeon: Jessy Oto, MD;  Location: [facility];  Service: Orthopedics;  Laterality: N/A;    History reviewed. No pertinent family history.  Social History:  reports that he quit smoking about 44 years ago. His smoking use included cigarettes. He has never used smokeless tobacco. He reports that he does not drink alcohol or use drugs.  Allergies:  Allergies  Allergen Reactions  . Unasyn [Ampicillin-Sulbactam Sodium] Hives, Itching, Swelling and Rash    Medications:  I have reviewed the patient's current medications. Prior to Admission:  Medications Prior to Admission  Medication Sig Dispense Refill Last Dose  . calcium citrate-vitamin D (CITRACAL+D) 315-200 MG-UNIT per tablet Take 1 tablet by mouth daily.    06/26/2018 at Unknown time  . DULoxetine (CYMBALTA) 60  MG capsule TAKE 1 CAPSULE(60 MG) BY MOUTH DAILY 90 capsule 0 06/26/2018 at Unknown time  . meloxicam (MOBIC) 15 MG tablet Take 1 tablet (15 mg total) by mouth daily. 90 tablet 4 06/26/2018 at Unknown time  . vitamin B-12 (CYANOCOBALAMIN) 1000 MCG tablet Take 1,000 mcg by mouth daily.   06/26/2018 at Unknown time   Scheduled: . acetaminophen  1,000 mg Oral Q6H  . docusate sodium  100 mg Oral BID  . HYDROcodone-acetaminophen  1 tablet Oral Q6H  . [START ON 06/29/2018] Influenza vac split quadrivalent PF  0.5 mL Intramuscular Tomorrow-1000   Continuous: . sodium chloride 125 mL/hr at 06/28/18 0831  . sodium chloride 125 mL/hr at 06/28/18 1412  . methocarbamol (ROBAXIN) IV    . methylPREDNISolone (SOLU-MEDROL) LOAD IVPB     Followed by  . methylPREDNISolone (STEROID PROTOCOL) infusion      Results for orders placed or performed during the hospital encounter of 06/27/18 (from the past 48 hour(s))  CBC with Differential/Platelet     Status: Abnormal   Collection Time: 06/28/18 12:02 AM  Result Value Ref Range   WBC 13.5 (H) 4.0 - 10.5 K/uL   RBC 4.98 4.22 - 5.81 MIL/uL  Hemoglobin 14.2 13.0 - 17.0 g/dL   HCT 44.5 39.0 - 52.0 %   MCV 89.4 78.0 - 100.0 fL   MCH 28.5 26.0 - 34.0 pg   MCHC 31.9 30.0 - 36.0 g/dL   RDW 15.6 (H) 11.5 - 15.5 %   Platelets 137 (L) 150 - 400 K/uL   Neutrophils Relative % 93 %   Lymphocytes Relative 2 %   Monocytes Relative 5 %   Eosinophils Relative 0 %   Basophils Relative 0 %   Neutro Abs 12.5 (H) 1.7 - 7.7 K/uL   Lymphs Abs 0.3 (L) 0.7 - 4.0 K/uL   Monocytes Absolute 0.7 0.1 - 1.0 K/uL   Eosinophils Absolute 0.0 0.0 - 0.7 K/uL   Basophils Absolute 0.0 0.0 - 0.1 K/uL   WBC Morphology MILD LEFT SHIFT (1-5% METAS, OCC MYELO, OCC BANDS)     Comment: Performed at [facility] [address] [place], [place] [address]  Comprehensive metabolic panel     Status: Abnormal   Collection Time: 06/28/18 12:02 AM  Result Value Ref Range   Sodium 141 135 -  145 mmol/L   Potassium 4.0 3.5 - 5.1 mmol/L    Comment: SLIGHT HEMOLYSIS   Chloride 101 98 - 111 mmol/L   CO2 27 22 - 32 mmol/L   Glucose, Bld 147 (H) 70 - 99 mg/dL   BUN 26 (H) 6 - 20 mg/dL   Creatinine, Ser 1.77 (H) 0.61 - 1.24 mg/dL   Calcium 9.0 8.9 - 10.3 mg/dL   Total Protein 6.9 6.5 - 8.1 g/dL   Albumin 3.0 (L) 3.5 - 5.0 g/dL   AST 47 (H) 15 - 41 U/L   ALT 21 0 - 44 U/L   Alkaline Phosphatase 93 38 - 126 U/L   Total Bilirubin 1.7 (H) 0.3 - 1.2 mg/dL   GFR calc non Af Amer 40 (L) >60 mL/min   GFR calc Af Amer 46 (L) >60 mL/min    Comment: (NOTE) The eGFR has been calculated using the CKD EPI equation. This calculation has not been validated in all clinical situations. eGFR's persistently <60 mL/min signify possible Chronic Kidney Disease.    Anion gap 13 5 - 15    Comment: Performed at [facility] [address], [place], [place] [address]  C-reactive protein     Status: Abnormal   Collection Time: 06/28/18  9:15 AM  Result Value Ref Range   CRP 30.9 (H) <1.0 mg/dL    Comment: Performed at [facility] [address] [place], [place] [address]  Sedimentation rate     Status: Abnormal   Collection Time: 06/28/18  9:15 AM  Result Value Ref Range   Sed Rate 56 (H) 0 - 16 mm/hr    Comment: Performed at [facility] [address] [place], [place] [address]    Mr Thoracic Spine Wo Contrast  Result Date: 06/28/2018 CLINICAL DATA:  Worsening low back pain radiating to bilateral lower extremity with numbness for several months. RIGHT leg weakness today, bilateral lower extremity paralysis for 2 days. Status post lumbar spine surgery February 2019. EXAM: MRI THORACIC AND LUMBAR SPINE WITHOUT CONTRAST TECHNIQUE: Multiplanar and multiecho pulse sequences of the thoracic and lumbar spine were obtained without intravenous contrast. COMPARISON:  MRI lumbar spine July 29, 2017 FINDINGS: MRI THORACIC SPINE FINDINGS ALIGNMENT: Maintenance of the thoracic  kyphosis. No malalignment. VERTEBRAE/DISCS: Vertebral bodies are intact. Severe disc height loss T3-4 through T 910  with diffuse disc desiccation and moderate subacute discogenic endplate changes. No acute or abnormal bone marrow signal. Borderline congenital canal narrowing on the basis of foreshortened pedicles. CORD: Subcentimeter holo cord T2 bright signal at T7-8. 12 mm segment T2 bright signal spinal cord at T9-10, potential myelomalacia. No syrinx. PREVERTEBRAL AND PARASPINAL SOFT TISSUES: Symmetric moderate to severe paraspinal muscle atrophy. DISC LEVELS: T1-2: No disc bulge. Mild facet arthropathy. No canal stenosis. Mild neural foraminal narrowing. T2-3: Small broad-based disc bulge with superimposed 4 mm LEFT central disc protrusion. Mild facet arthropathy. Moderate canal stenosis. Severe RIGHT and moderate LEFT neural foraminal narrowing. T3-4, T4-5: No disc bulge, canal stenosis nor neural foraminal narrowing. T5-6: Bilateral subarticular disc protrusions measuring to 5 mm on the LEFT. Mild facet arthropathy. Mild canal stenosis. Moderate to severe RIGHT and moderate LEFT neural foraminal narrowing. T6-7: Small broad-based disc bulge minimal facet arthropathy. No canal stenosis. Moderate RIGHT neural foraminal narrowing. T7-8: 3 mm broad-based disc bulge. Mild facet arthropathy. Mild canal stenosis. Mild neural foraminal narrowing. T8-9: Small RIGHT subarticular disc protrusion. No canal stenosis. Moderate RIGHT and mild LEFT neural foraminal narrowing. T9-10: 5 mm broad-based disc bulge, moderate facet arthropathy. Moderate canal stenosis. Severe bilateral neural foraminal narrowing. T10-11: 3 mm broad-based disc bulge. Severe facet arthropathy. Moderate canal stenosis. Severe bilateral neural foraminal narrowing. T11-12: Annular bulging. Mild facet arthropathy. No canal stenosis or neural foraminal narrowing. T12-L1: No disc bulge. Moderate facet arthropathy without canal stenosis. Mild LEFT neural  foraminal narrowing. MRI LUMBAR SPINE FINDINGS-large body habitus results in decreased signal to noise ratio, mild motion degraded examination. SEGMENTATION: For the purposes of this report, the last well-formed intervertebral disc is reported as L5-S1. ALIGNMENT: Maintained lumbar lordosis. Similar minimal grade 1 L5-S1 anterolisthesis. Grade 1 L2-3 retrolisthesis. VERTEBRAE:Vertebral bodies are intact. Intervertebral discs demonstrate normal morphology, mild desiccation and superimposed mild disc edema lower lumbar discs. Moderate subacute discogenic endplate changes Z6-S0. Multilevel mild subacute to chronic discogenic endplate changes. No suspicious bone marrow signal. CONUS MEDULLARIS AND CAUDA EQUINA: Conus medullaris terminates at L1-2 and demonstrates normal signal. Cauda equina not discretely identified. PARASPINAL AND OTHER SOFT TISSUES: Severe paraspinal muscle denervation/atrophy. Asymmetric LEFT distal iliopsoas muscle atrophy. 2.9 x 4.3 x 6.1 cm central paraspinal fluid collection L4 through S1 along surgical approach. DISC LEVELS: L1-2: Small broad-based disc bulge. Moderate RIGHT and moderate to severe LEFT facet arthropathy. No canal stenosis. Mild LEFT neural foraminal narrowing. L2-3: Retrolisthesis. Small broad-based disc bulge. Status post LEFT hemilaminectomy. Moderate facet arthropathy without canal stenosis. Moderate bilateral neural foraminal narrowing. L3-4: No disc bulge. LEFT and possible RIGHT laminectomies. Severe facet arthropathy. No canal stenosis. Mild neural foraminal narrowing. L4-5: LEFT and possibly RIGHT laminectomies. Severe facet arthropathy with RIGHT facet effusion contiguous with paraspinal fluid collection. RIGHT facet is widened to 3 mm. 5 mm central disc protrusion versus extrusion. Moderate to severe canal stenosis predominately due to fluid collection suspected though, this may be overestimated. Severe bilateral neural foraminal narrowing. L5-S1: Anterolisthesis.  Annular bulging. Severe facet arthropathy. Moderate canal stenosis. Moderate to severe bilateral neural foraminal narrowing. IMPRESSION: MR THORACIC SPINE IMPRESSION 1. No fracture, malalignment or acute osseous process. 2. Subcentimeter cord edema T7-8, less likely myelomalacia. 12 mm segment suspected myelomalacia T9-10. No syrinx. 3. Degenerative change of the lumbar spine resulting in moderate canal stenosis T9-10 and T10-11. Multilevel mild canal stenosis. 4. Multilevel neural foraminal narrowing: Severe at T2-3, T9-10 and T10-11. 5. Moderate to severe paraspinal muscle atrophy. MR LUMBAR SPINE IMPRESSION 1. Habitus and motion  degraded examination. Similar grade 1 L2-3 retrolisthesis and grade 1 L4-5 anterolisthesis. No fracture. 2. L2-3 through L4-5 laminectomies. 2.9 x 4.3 x 6.1 cm fluid collection along surgical approach contiguous with RIGHT L4-5 facet effusion, septic arthropathy possible. Consider sampling. 3. Moderate to severe suspected canal stenosis L4-5 impart due to fluid collection, potentially overestimated. Moderate canal stenosis L5-S1. 4. Neural foraminal narrowing all lumbar levels: Severe at L4-5. 5. Severe paraspinal muscle atrophy, early LEFT psoas muscle atrophy. Electronically Signed   By: Elon Alas M.D.   On: 06/28/2018 03:10   Mr Lumbar Spine Wo Contrast  Result Date: 06/28/2018 CLINICAL DATA:  Worsening low back pain radiating to bilateral lower extremity with numbness for several months. RIGHT leg weakness today, bilateral lower extremity paralysis for 2 days. Status post lumbar spine surgery February 2019. EXAM: MRI THORACIC AND LUMBAR SPINE WITHOUT CONTRAST TECHNIQUE: Multiplanar and multiecho pulse sequences of the thoracic and lumbar spine were obtained without intravenous contrast. COMPARISON:  MRI lumbar spine July 29, 2017 FINDINGS: MRI THORACIC SPINE FINDINGS ALIGNMENT: Maintenance of the thoracic kyphosis. No malalignment. VERTEBRAE/DISCS: Vertebral bodies are  intact. Severe disc height loss T3-4 through T 910 with diffuse disc desiccation and moderate subacute discogenic endplate changes. No acute or abnormal bone marrow signal. Borderline congenital canal narrowing on the basis of foreshortened pedicles. CORD: Subcentimeter holo cord T2 bright signal at T7-8. 12 mm segment T2 bright signal spinal cord at T9-10, potential myelomalacia. No syrinx. PREVERTEBRAL AND PARASPINAL SOFT TISSUES: Symmetric moderate to severe paraspinal muscle atrophy. DISC LEVELS: T1-2: No disc bulge. Mild facet arthropathy. No canal stenosis. Mild neural foraminal narrowing. T2-3: Small broad-based disc bulge with superimposed 4 mm LEFT central disc protrusion. Mild facet arthropathy. Moderate canal stenosis. Severe RIGHT and moderate LEFT neural foraminal narrowing. T3-4, T4-5: No disc bulge, canal stenosis nor neural foraminal narrowing. T5-6: Bilateral subarticular disc protrusions measuring to 5 mm on the LEFT. Mild facet arthropathy. Mild canal stenosis. Moderate to severe RIGHT and moderate LEFT neural foraminal narrowing. T6-7: Small broad-based disc bulge minimal facet arthropathy. No canal stenosis. Moderate RIGHT neural foraminal narrowing. T7-8: 3 mm broad-based disc bulge. Mild facet arthropathy. Mild canal stenosis. Mild neural foraminal narrowing. T8-9: Small RIGHT subarticular disc protrusion. No canal stenosis. Moderate RIGHT and mild LEFT neural foraminal narrowing. T9-10: 5 mm broad-based disc bulge, moderate facet arthropathy. Moderate canal stenosis. Severe bilateral neural foraminal narrowing. T10-11: 3 mm broad-based disc bulge. Severe facet arthropathy. Moderate canal stenosis. Severe bilateral neural foraminal narrowing. T11-12: Annular bulging. Mild facet arthropathy. No canal stenosis or neural foraminal narrowing. T12-L1: No disc bulge. Moderate facet arthropathy without canal stenosis. Mild LEFT neural foraminal narrowing. MRI LUMBAR SPINE FINDINGS-large body habitus  results in decreased signal to noise ratio, mild motion degraded examination. SEGMENTATION: For the purposes of this report, the last well-formed intervertebral disc is reported as L5-S1. ALIGNMENT: Maintained lumbar lordosis. Similar minimal grade 1 L5-S1 anterolisthesis. Grade 1 L2-3 retrolisthesis. VERTEBRAE:Vertebral bodies are intact. Intervertebral discs demonstrate normal morphology, mild desiccation and superimposed mild disc edema lower lumbar discs. Moderate subacute discogenic endplate changes W6-F6. Multilevel mild subacute to chronic discogenic endplate changes. No suspicious bone marrow signal. CONUS MEDULLARIS AND CAUDA EQUINA: Conus medullaris terminates at L1-2 and demonstrates normal signal. Cauda equina not discretely identified. PARASPINAL AND OTHER SOFT TISSUES: Severe paraspinal muscle denervation/atrophy. Asymmetric LEFT distal iliopsoas muscle atrophy. 2.9 x 4.3 x 6.1 cm central paraspinal fluid collection L4 through S1 along surgical approach. DISC LEVELS: L1-2: Small broad-based disc bulge. Moderate RIGHT  and moderate to severe LEFT facet arthropathy. No canal stenosis. Mild LEFT neural foraminal narrowing. L2-3: Retrolisthesis. Small broad-based disc bulge. Status post LEFT hemilaminectomy. Moderate facet arthropathy without canal stenosis. Moderate bilateral neural foraminal narrowing. L3-4: No disc bulge. LEFT and possible RIGHT laminectomies. Severe facet arthropathy. No canal stenosis. Mild neural foraminal narrowing. L4-5: LEFT and possibly RIGHT laminectomies. Severe facet arthropathy with RIGHT facet effusion contiguous with paraspinal fluid collection. RIGHT facet is widened to 3 mm. 5 mm central disc protrusion versus extrusion. Moderate to severe canal stenosis predominately due to fluid collection suspected though, this may be overestimated. Severe bilateral neural foraminal narrowing. L5-S1: Anterolisthesis. Annular bulging. Severe facet arthropathy. Moderate canal stenosis.  Moderate to severe bilateral neural foraminal narrowing. IMPRESSION: MR THORACIC SPINE IMPRESSION 1. No fracture, malalignment or acute osseous process. 2. Subcentimeter cord edema T7-8, less likely myelomalacia. 12 mm segment suspected myelomalacia T9-10. No syrinx. 3. Degenerative change of the lumbar spine resulting in moderate canal stenosis T9-10 and T10-11. Multilevel mild canal stenosis. 4. Multilevel neural foraminal narrowing: Severe at T2-3, T9-10 and T10-11. 5. Moderate to severe paraspinal muscle atrophy. MR LUMBAR SPINE IMPRESSION 1. Habitus and motion degraded examination. Similar grade 1 L2-3 retrolisthesis and grade 1 L4-5 anterolisthesis. No fracture. 2. L2-3 through L4-5 laminectomies. 2.9 x 4.3 x 6.1 cm fluid collection along surgical approach contiguous with RIGHT L4-5 facet effusion, septic arthropathy possible. Consider sampling. 3. Moderate to severe suspected canal stenosis L4-5 impart due to fluid collection, potentially overestimated. Moderate canal stenosis L5-S1. 4. Neural foraminal narrowing all lumbar levels: Severe at L4-5. 5. Severe paraspinal muscle atrophy, early LEFT psoas muscle atrophy. Electronically Signed   By: Elon Alas M.D.   On: 06/28/2018 03:10    Review of Systems  Constitutional: Positive for malaise/fatigue. Negative for chills, diaphoresis, fever and weight loss.  HENT: Negative.   Eyes: Negative for blurred vision, double vision, photophobia, pain, discharge and redness.  Respiratory: Negative.   Cardiovascular: Negative for chest pain, palpitations, orthopnea, claudication, leg swelling and PND.  Gastrointestinal: Negative for abdominal pain, blood in stool, constipation, diarrhea, heartburn, melena, nausea and vomiting.  Genitourinary: Positive for flank pain. Negative for dysuria, frequency, hematuria and urgency.  Musculoskeletal: Positive for back pain, falls and joint pain. Negative for myalgias and neck pain.  Skin: Negative for itching and  rash.  Neurological: Positive for tingling, sensory change, focal weakness, seizures and weakness. Negative for dizziness, tremors, speech change, loss of consciousness and headaches.  Endo/Heme/Allergies: Negative for environmental allergies and polydipsia. Does not bruise/bleed easily.  Psychiatric/Behavioral: Negative for depression, hallucinations, memory loss, substance abuse and suicidal ideas. The patient is not nervous/anxious and does not have insomnia.    Blood pressure (!) 132/94, pulse 81, temperature 98 F (36.7 C), temperature source Oral, resp. rate 18, SpO2 96 %. Physical Exam   hysical Exam: General: Alert, no acute distress Cardiovascular: No pedal edema Respiratory: No cyanosis, no use of accessory musculature GI: No organomegaly, abdomen is soft and non-tender, large, distented and tympanic to percussion, decreased bowel sounds, has not voided since incontinence during MRI of the thoracic and lumbar areas in scanner, order for UA 06/27/2018 at 2300, no Report.  Skin: Areas of scar over the thorax and arms and legs, previous lqarge areas of skin sores are healed. Neurologic: Decreased sensation left hand and bilateral lower extremities. Weak diffusely both legs proximal and distal right greater than left. Right foot DF and PF 0/5, right knee extension 1/5, left leg with left foot DF  2/5, left knee extension 2/5 hip flexion is weak 2/5 bilaterally. Cervical spine ROM  Is pain free, There is weakness left biceps 2/5, right biceps 5/5 Numbness left C5  Triceps 5/5 finger extension 5/5 bilaterally Psychiatric: Patient is competent for consent with normal mood and affect Lymphatic: No axillary or cervical lymphadenopathy   Orthopaedic Exam: Morbidly Obese Neurologic: Decreased sensation left hand and bilateral lower extremities. Weak diffusely both legs proximal and distal right greater than left. Right foot DF and PF 0/5, right knee extension 1/5, left leg with left foot DF 2/5,  left knee extension 2/5 hip flexion is weak 2/5 bilaterally. Cervical spine ROM  Is pain free, There is weakness left biceps 2/5, right biceps 5/5 Numbness left C5  Triceps 5/5 finger extension 5/5 bilaterally  Assessment/Plan:1)Flaccid paralysis both lower extremities, left arm weakness C5, C6. Thoracic myelopathy, chronic lumbar radiculopathy. Clinically worsening lower extremity  Weakness and left arm weakness new in  C5, C6 pattern.  S/P redo multilevel lumbar central laminectomies L2-3, L3-4, L4-5 and L5-S1 12/06/2017. Fluid collection at the L4-5 level may represent infection or seroma. Clinically no warm or fluctuance over the lumbar area, no significant, lumbosacral  tenderness.  2)History of Urosepsis 2015 with nephrolithiasis, need to assess for UTI, may have both UTI and lumbar infection though the lumbar spine clinically is benign. Last void was early this AM ?2 AM,  No void since then and no urge to void. 3)Left knee effusion, bilateral knee osteoarthritis and bilateral hip osteoarthritis. 4) Morbid Obesity There is no height or weight on file to calculate BMI.  5)Renal insufficiency, Cr 1.7, reports poor po intake past 2 days.  6) Bilateral shoulder osteoarthritis.    Plan:Plan: I will admit Mr. Kronberg to my service as he has likely thoracic myelopathy and chronic lumbar radiculopathy, clinically there is concern for a cervical radiculopathy as well. Acute spinal cord injury steroid protocol will be started, discussed with neurologist on call. Due to his size the MRI is limited in terms of resolution but suggests a fluid collection contiguous with fluid within the right L4-5 facet. Aspiration of the fluid is ordered to determine if this is Abscess or seroma. A foley catheter is placed In the ER to assess for urinary retention and send specimens for UA and urine C&S.  Blood cultures and serum uric acid will be accessed.  Admit for work up of the numberous neurologic deficits, MRI of the  cervical spine.  Hydrate, assess renal function, discontinue use of NSAIDs. MRI of cervical spine to assess for cause of left biceps weakness. Radiograph evaluation, C-spine, left shoulder and left knee.   Basil Dess 06/28/2018, 11:27 AM

## 2018-06-28 NOTE — Progress Notes (Addendum)
Patient ID: [name] [name], male   DOB: [birth year], 62 y.o.   MRN: [id number] Sed rate 56 CRP 30.9 Procalcitonin 3.69 consistent with sepsis Aspiration of Lumbar fluid collection shows abundant WBC with gram positive cocci in clusters. Likely staph Start vancomycin and zosyn until results of culture and sensitivities are available.Has unasyn allergy so avoid cephalosporins.  Plain radiographs of the left knee with moderate effusion, severe tricompartment osteoarthritis with varus deformity and medial subluxation femur on tibia, no acute changes.  Dx: Lumbar abscess nearly 7 months post multilevel lumbar decompressive laminectomy.  Plan: Will obtain MRI of the cervical spine. Discontinue methylprednisone due to results of aspiration and procalcitonin. Nursing contacted to stop IV prednisone.  Appreciate the aspiration of this lumbar fluid collection by interventional radiology.

## 2018-06-28 NOTE — ED Provider Notes (Signed)
MRI results noted.  Patient with myelomalacia of thoracic spine as well as canal stenosis of thoracic and lumbar spine.  There is fluid collection along previous laminectomy site concerning for septic arthropathy.  Patient's lumbar surgery was done by Dr. Otelia SergeantNitka of orthopedics in February.  Discussed with Dr. Magnus IvanBlackman who is covering for him tonight.  He is uncertain whether Dr. Otelia SergeantNitka available and will have to call him later this morning.  Discussed with neurosurgery Dr. Maurice Smallstergard states he will evaluate patient. Does not recommend any antibiotics or steroids at this time.    [name] [name], [name] Lovelady, MD 06/28/18 [phone]

## 2018-06-28 NOTE — ED Notes (Signed)
Ice chips given per MD

## 2018-06-28 NOTE — Sedation Documentation (Signed)
Pt to go to [facility] for X rays.

## 2018-06-28 NOTE — Plan of Care (Signed)

## 2018-06-28 NOTE — Procedures (Signed)
Pre procedural Dx: Paraspinal fluid collection Post procedural Dx: Same  Technically successful CT guided aspiration of approximately 7 cc of blood tinged fluid from the dominant fluid collection posterior to the L4-L5 vertebral bodies.  All aspirated samples sent to the laboratory for analysis.    EBL: None  Complications: None immediate  Katherina RightJay Emoni Whitworth, MD Pager #: [phone]

## 2018-06-28 NOTE — Progress Notes (Signed)
Patient ID: [name] [name], male   DOB: 1955-12-07, 62 y.o.   MRN: [id number] I have seen and examined the patient.  He reports a 2 day history of progressive weakness in his legs as well as back pain.  Thoracic and lumbar spine MRI studies were obtained.  These have been reviewed.  He is non-septic appearing and has normal vital signs.  His follows commands well and does not appear uncomfortable.  He does have significant lower extremity weakness.  Base on our office notes, I am unsure as to his baseline.  When he is in the office, he is usually in a wheelchair.  His WBC here is 13,000.  Will order a CR-P and ESR.  I have spoken to Dr. Louanne Skye who knows this patients well.  Dr. Louanne Skye has graciously agreed to come and see the patient this morning.

## 2018-06-28 NOTE — H&P (Addendum)
ADMISSION H&P  Chief Complaint:Profound weakness both legs. HPI: [name] [name] is a 62 y.o. male who presents for evaluation and physical with a diagnosis of thoracic myelopathy, flaccid paralysic both legs, left cervical radiculopathy. Symptoms are rated as severe, and have been worsening.  This is severely impairing activities of daily living.  EAV:WUJWJX Corpus is an 62 y.o. male.History of lumbar multilevel central laminectomy for recurrent lumbar spinal Stenosis 11/2107, surgery L2-3 through L5-S1. Previous lumbar laminectomy L4-5 and L5-S1 in the distance past. He had severe weakness preop in both legs, with right foot drop. He has used a walker for short distances up to 20 feet And a wheel chair in his house. The electric scooter in the grocery shop. He has had multiple falls over the past one  year the most recent occurring this past week. He has severe bilateral knee and hip osteoarthritis which also limits his ability to stand and walk. Due to his size he is not considered a surgical candidate for knee or hip replacement. He underwent Previous bariatric surgery and lost nearly 100 lbs but had regained much of the weight he lost. He underwent PT following  Surgery and reported only modest improvement in his ability to ambulate. 2 days ago he began experiencing worsening  Weakness in his legs and difficulty getting out of bed due to legs being weak. He reports that he took ibuprofen and mobic And had some relief of pain but remained in bed most of Friday 2 days ago. Yesterday he reports that he attempted to get up Out of bed and found the weakness in his legs to be profound. He is having back pain at the L-S junction. He has been seen Over the past 2-3 months by Dr. August Saucer for bilateral shoulder pain and weakness and has severe osteoarthritis of both shoulders. Dr. August Saucer is considering a shoulder replacement but is concerned due to his size and obvious dependency of his arms for  Assisting in  transfers. [name] [name] has noticed weakness in the left arm and numbness since the most recent fall this\ Past week. No chills, but felt like he could have a fever, did not check with thermometer. Here he is afebrile, no history of diabetes.     Presented to the ER last evening, my partner was contacted, I was contacted to see [name] [name] this AM. The results of MRI of thoracic spine and lumbar spine most significant for intense cord T2 signal at T9-10, fluid collection At L4-5 level contiguous with right L4-5 facet may represent Abscess, aspiration recommended for further analysis.   Past Medical History:  Diagnosis Date  . Anemia   . Arthritis   . Chronic kidney disease    HX acute kidney failure / acute pyelonephritis / hydronephrosis / severe sepsis per discharge summary 12/27/13  . History of kidney stones   . Hypertension   . Morbid obesity (HCC)   . Obstructive sleep apnea    does not need c pap since 110 lb wt loss  . Osteoporosis   . Prurigo 2002  . Scars    ON ARMS FROM CHEMICAL EXPLOSION 1999  . Spinal stenosis    Past Surgical History:  Procedure Laterality Date  . BACK SURGERY  01/23/2010   lumbar  . CHOLECYSTECTOMY N/A 03/24/2016   Procedure: LAPAROSCOPIC CHOLECYSTECTOMY WITH INTRAOPERATIVE CHOLANGIOGRAM;  Surgeon: Chevis Pretty III, MD;  Location: [facility];  Service: General;  Laterality: N/A;  . CIRCUMCISION    . CYSTOSCOPY WITH RETROGRADE PYELOGRAM, URETEROSCOPY  AND STENT PLACEMENT Left 01/20/2014   Procedure: CYSTOSCOPY WITH RETROGRADE PYELOGRAM, URETEROSCOPY AND STENT EXCHANGE;  Surgeon: Milford Cageaniel Young Woodruff, MD;  Location: [facility];  Service: Urology;  Laterality: Left;  bugbee bladder fulguration  . CYSTOSCOPY WITH STENT PLACEMENT Left 12/28/2013   Procedure: CYSTOSCOPY WITH STENT PLACEMENT left retrograde pyleogram;  Surgeon: Milford Cageaniel Young Woodruff, MD;  Location: [facility];  Service: Urology;  Laterality: Left;  . ESOPHAGOGASTRODUODENOSCOPY N/A 12/07/2012   Procedure:  ESOPHAGOGASTRODUODENOSCOPY (EGD);  Surgeon: Lodema PilotBrian Layton, DO;  Location: [facility];  Service: General;  Laterality: N/A;  . HOLMIUM LASER APPLICATION Left 01/20/2014   Procedure: HOLMIUM LASER APPLICATION;  Surgeon: Milford Cageaniel Young Woodruff, MD;  Location: [facility];  Service: Urology;  Laterality: Left;  . LAPAROSCOPIC GASTRIC SLEEVE RESECTION N/A 12/07/2012   Procedure: LAPAROSCOPIC GASTRIC SLEEVE RESECTION;  Surgeon: Lodema PilotBrian Layton, DO;  Location: [facility];  Service: General;  Laterality: N/A;  laparoscopic sleeve gastrectomy with EGD  . LUMBAR LAMINECTOMY N/A 11/17/2017   Procedure: L2-3 LAMINECTOMY AND REDO LAMINECTOMIES  L3-4, L4-5 AND L5-S1;  Surgeon: Kerrin ChampagneNitka, Bindu Docter E, MD;  Location: [facility];  Service: Orthopedics;  Laterality: N/A;   Social History   Socioeconomic History  . Marital status: Married    Spouse name: Not on file  . Number of children: Not on file  . Years of education: Not on file  . Highest education level: Not on file  Occupational History  . Not on file  Social Needs  . Financial resource strain: Not on file  . Food insecurity:    Worry: Not on file    Inability: Not on file  . Transportation needs:    Medical: Not on file    Non-medical: Not on file  Tobacco Use  . Smoking status: Former Smoker    Types: Cigarettes    Last attempt to quit: 04/09/1974    Years since quitting: 44.2  . Smokeless tobacco: Never Used  Substance and Sexual Activity  . Alcohol use: No  . Drug use: No  . Sexual activity: Not on file  Lifestyle  . Physical activity:    Days per week: Not on file    Minutes per session: Not on file  . Stress: Not on file  Relationships  . Social connections:    Talks on phone: Not on file    Gets together: Not on file    Attends religious service: Not on file    Active member of club or organization: Not on file    Attends meetings of clubs or organizations: Not on file    Relationship status: Not on file  Other Topics Concern  . Not on file  Social  History Narrative  . Not on file   History reviewed. No pertinent family history. Allergies  Allergen Reactions  . Unasyn [Ampicillin-Sulbactam Sodium] Hives, Itching, Swelling and Rash   Prior to Admission medications   Medication Sig Start Date End Date Taking? Authorizing Provider  calcium citrate-vitamin D (CITRACAL+D) 315-200 MG-UNIT per tablet Take 1 tablet by mouth daily.    Yes [provider]  DULoxetine (CYMBALTA) 60 MG capsule TAKE 1 CAPSULE(60 MG) BY MOUTH DAILY 05/25/18  Yes Tyrell AntonioNewton, Frederic, MD  meloxicam (MOBIC) 15 MG tablet Take 1 tablet (15 mg total) by mouth daily. 02/16/18  Yes Kerrin ChampagneNitka, Levita Monical E, MD  vitamin B-12 (CYANOCOBALAMIN) 1000 MCG tablet Take 1,000 mcg by mouth daily.   Yes [provider]     Positive ROS: All other systems have been reviewed and  were otherwise negative with the exception of those mentioned in the HPI and as above.  Physical Exam: General: Alert, no acute distress Cardiovascular: No pedal edema Respiratory: No cyanosis, no use of accessory musculature GI: No organomegaly, abdomen is soft and non-tender Skin: No lesions in the area of chief complaint Neurologic: Sensation intact distally Psychiatric: Patient is competent for consent with normal mood and affect Lymphatic: No axillary or cervical lymphadenopathy  MUSCULOSKELETAL:Orthopaedic Exam: Morbidly Obese Neurologic: Decreased sensation left hand and bilateral lower extremities. Weak diffusely both legs proximal and distal right greater than left. Right foot DF and PF 0/5, right knee extension 1/5, left leg with left foot DF 2/5, left knee extension 2/5 hip flexion is weak 2/5 bilaterally. Cervical spine ROM  Is pain free, There is weakness left biceps 2/5, right biceps 5/5 Numbness left C5  Triceps 5/5 finger extension 5/5 bilaterally   Assessment: Orthopedic Diagnosis:1)Flaccid paralysis both lower extremities, left arm weakness C5, C6. Thoracic myelopathy, MRI thoracic  spine with intense T2 holocord fingings at T9-10.  Chronic lumbar radiculopathy. Clinically worsening lower extremity weakness and left arm weakness new in  C5, C6 pattern.  S/P redo multilevel lumbar central laminectomies L2-3, L3-4, L4-5 and L5-S1 12/06/2017. Fluid collection at the L4-5 level may represent infection or seroma. Clinically no warm or fluctuance over the lumbar area, no significant, lumbosacral  tenderness.  2)History of Urosepsis 2015 with nephrolithiasis, need to assess for UTI, may have both UTI and lumbar infection though the lumbar spine clinically is benign. Last void was early this AM ?2 AM,  No void since then and no urge to void. 3)Left knee effusion, bilateral knee osteoarthritis and bilateral hip osteoarthritis. 4) Morbid Obesity There is no height or weight on file to calculate BMI.  5)Renal insufficiency, Cr 1.7, reports poor po intake past 2 days.  6) Bilateral shoulder osteoarthritis.   Plan:Plan: I will admit [name] [name] to my service as he has likely thoracic myelopathy and chronic lumbar radiculopathy, clinically there is concern for a cervical radiculopathy as well.Acute spinal cord injury steroid protocol will be started, discussed with neurologist on call. Due to his size the MRI is limited in terms of resolution but suggests a fluid collection contiguous with fluid within the right L4-5 facet. Aspiration of the fluid is ordered to determine if this is Abscess or seroma. A foley catheter is placed In the ER to assess for urinary retention and send specimens for UA and urine C&S.  Blood cultures and serum uric acid will be accessed.  Admit for work up of the numberous neurologic deficits, MRI of the cervical spine.  Hydrate, assess renal function, discontinue use of NSAIDs. MRI of cervical spine to assess for cause of left biceps weakness. Radiograph evaluation, C-spine, left shoulder and left knee.   Vira Browns, MD Cell [phone] Office  [phone] 06/28/2018 2:57 PM

## 2018-06-29 ENCOUNTER — Inpatient Hospital Stay (HOSPITAL_COMMUNITY): Payer: No Typology Code available for payment source | Admitting: Certified Registered Nurse Anesthetist

## 2018-06-29 ENCOUNTER — Inpatient Hospital Stay (HOSPITAL_COMMUNITY): Payer: No Typology Code available for payment source

## 2018-06-29 ENCOUNTER — Other Ambulatory Visit (HOSPITAL_COMMUNITY): Payer: No Typology Code available for payment source

## 2018-06-29 ENCOUNTER — Encounter (HOSPITAL_COMMUNITY): Payer: Self-pay

## 2018-06-29 ENCOUNTER — Encounter (HOSPITAL_COMMUNITY)
Admission: EM | Disposition: A | Discharge status: Rehabilitation Facility | Payer: Self-pay | Source: Home / Self Care | Attending: Specialist

## 2018-06-29 DIAGNOSIS — K5900 Constipation, unspecified: Secondary | ICD-10-CM

## 2018-06-29 DIAGNOSIS — Z981 Arthrodesis status: Secondary | ICD-10-CM

## 2018-06-29 DIAGNOSIS — R7881 Bacteremia: Secondary | ICD-10-CM

## 2018-06-29 DIAGNOSIS — G061 Intraspinal abscess and granuloma: Secondary | ICD-10-CM

## 2018-06-29 DIAGNOSIS — M4714 Other spondylosis with myelopathy, thoracic region: Secondary | ICD-10-CM

## 2018-06-29 DIAGNOSIS — M00062 Staphylococcal arthritis, left knee: Secondary | ICD-10-CM

## 2018-06-29 DIAGNOSIS — N179 Acute kidney failure, unspecified: Secondary | ICD-10-CM

## 2018-06-29 DIAGNOSIS — M25511 Pain in right shoulder: Secondary | ICD-10-CM

## 2018-06-29 DIAGNOSIS — B9561 Methicillin susceptible Staphylococcus aureus infection as the cause of diseases classified elsewhere: Secondary | ICD-10-CM

## 2018-06-29 DIAGNOSIS — D72829 Elevated white blood cell count, unspecified: Secondary | ICD-10-CM

## 2018-06-29 DIAGNOSIS — M48061 Spinal stenosis, lumbar region without neurogenic claudication: Secondary | ICD-10-CM

## 2018-06-29 DIAGNOSIS — N189 Chronic kidney disease, unspecified: Secondary | ICD-10-CM

## 2018-06-29 DIAGNOSIS — Z881 Allergy status to other antibiotic agents status: Secondary | ICD-10-CM

## 2018-06-29 DIAGNOSIS — G4730 Sleep apnea, unspecified: Secondary | ICD-10-CM

## 2018-06-29 DIAGNOSIS — M25512 Pain in left shoulder: Secondary | ICD-10-CM

## 2018-06-29 DIAGNOSIS — G9763 Postprocedural seroma of a nervous system organ or structure following a nervous system procedure: Secondary | ICD-10-CM

## 2018-06-29 DIAGNOSIS — M25462 Effusion, left knee: Secondary | ICD-10-CM

## 2018-06-29 DIAGNOSIS — Z87891 Personal history of nicotine dependence: Secondary | ICD-10-CM

## 2018-06-29 HISTORY — PX: LUMBAR WOUND DEBRIDEMENT: SHX1988

## 2018-06-29 HISTORY — PX: KNEE ARTHROSCOPY: SHX127

## 2018-06-29 LAB — URINE CULTURE: SPECIAL REQUESTS: NORMAL

## 2018-06-29 LAB — COMPREHENSIVE METABOLIC PANEL
ALK PHOS: 99 U/L (ref 38–126)
ALT: 28 U/L (ref 0–44)
AST: 59 U/L — AB (ref 15–41)
Albumin: 2.6 g/dL — ABNORMAL LOW (ref 3.5–5.0)
Anion gap: 10 (ref 5–15)
BUN: 42 mg/dL — AB (ref 6–20)
CO2: 24 mmol/L (ref 22–32)
CREATININE: 2.12 mg/dL — AB (ref 0.61–1.24)
Calcium: 8.6 mg/dL — ABNORMAL LOW (ref 8.9–10.3)
Chloride: 107 mmol/L (ref 98–111)
GFR calc non Af Amer: 32 mL/min — ABNORMAL LOW (ref >60)
GFR, EST AFRICAN AMERICAN: 37 mL/min — AB (ref >60)
GLUCOSE: 187 mg/dL — AB (ref 70–99)
Potassium: 3.6 mmol/L (ref 3.5–5.1)
SODIUM: 141 mmol/L (ref 135–145)
Total Bilirubin: 1.6 mg/dL — ABNORMAL HIGH (ref 0.3–1.2)
Total Protein: 6.7 g/dL (ref 6.5–8.1)

## 2018-06-29 LAB — CBC
HCT: 42.6 % (ref 39.0–52.0)
HEMOGLOBIN: 13.5 g/dL (ref 13.0–17.0)
MCH: 28 pg (ref 26.0–34.0)
MCHC: 31.7 g/dL (ref 30.0–36.0)
MCV: 88.4 fL (ref 78.0–100.0)
Platelets: 144 10*3/uL — ABNORMAL LOW (ref 150–400)
RBC: 4.82 MIL/uL (ref 4.22–5.81)
RDW: 15.3 % (ref 11.5–15.5)
WBC: 12.6 10*3/uL — AB (ref 4.0–10.5)

## 2018-06-29 LAB — ECHOCARDIOGRAM COMPLETE
HEIGHTINCHES: 70 in
WEIGHTICAEL: 5213.44 [oz_av]

## 2018-06-29 LAB — SYNOVIAL CELL COUNT + DIFF, W/ CRYSTALS
Crystals, Fluid: NONE SEEN
Eosinophils-Synovial: 0 % (ref 0–1)
Lymphocytes-Synovial Fld: 6 % (ref 0–20)
MONOCYTE-MACROPHAGE-SYNOVIAL FLUID: 14 % — AB (ref 50–90)
Neutrophil, Synovial: 80 % — ABNORMAL HIGH (ref 0–25)
WBC, Synovial: 93000 /mm3 — ABNORMAL HIGH (ref 0–200)

## 2018-06-29 LAB — BLOOD CULTURE ID PANEL (REFLEXED)
Acinetobacter baumannii: NOT DETECTED
CANDIDA ALBICANS: NOT DETECTED
CANDIDA GLABRATA: NOT DETECTED
CANDIDA KRUSEI: NOT DETECTED
Candida parapsilosis: NOT DETECTED
Candida tropicalis: NOT DETECTED
ENTEROBACTER CLOACAE COMPLEX: NOT DETECTED
ENTEROBACTERIACEAE SPECIES: NOT DETECTED
ESCHERICHIA COLI: NOT DETECTED
Enterococcus species: NOT DETECTED
Haemophilus influenzae: NOT DETECTED
KLEBSIELLA OXYTOCA: NOT DETECTED
KLEBSIELLA PNEUMONIAE: NOT DETECTED
Listeria monocytogenes: NOT DETECTED
Methicillin resistance: NOT DETECTED
Neisseria meningitidis: NOT DETECTED
Proteus species: NOT DETECTED
Pseudomonas aeruginosa: NOT DETECTED
STREPTOCOCCUS PYOGENES: NOT DETECTED
Serratia marcescens: NOT DETECTED
Staphylococcus aureus (BCID): DETECTED — AB
Staphylococcus species: DETECTED — AB
Streptococcus agalactiae: NOT DETECTED
Streptococcus pneumoniae: NOT DETECTED
Streptococcus species: NOT DETECTED

## 2018-06-29 LAB — HIV ANTIBODY (ROUTINE TESTING W REFLEX): HIV SCREEN 4TH GENERATION: NONREACTIVE

## 2018-06-29 LAB — MRSA PCR SCREENING: MRSA BY PCR: NEGATIVE

## 2018-06-29 LAB — TYPE AND SCREEN
ABO/RH(D): A POS
ANTIBODY SCREEN: NEGATIVE

## 2018-06-29 LAB — PROCALCITONIN: PROCALCITONIN: 3.98 ng/mL

## 2018-06-29 LAB — URIC ACID: Uric Acid, Serum: 6.6 mg/dL (ref 3.7–8.6)

## 2018-06-29 SURGERY — LUMBAR WOUND DEBRIDEMENT
Anesthesia: General

## 2018-06-29 MED ORDER — CALCIUM CARBONATE-VITAMIN D 500-200 MG-UNIT PO TABS
1.0000 | ORAL_TABLET | Freq: Every day | ORAL | Status: DC
  Administered 2018-06-30 – 2018-07-09 (×10): 1 via ORAL
  Filled 2018-06-29 (×10): qty 1

## 2018-06-29 MED ORDER — VANCOMYCIN HCL 10 G IV SOLR
2000.0000 mg | Freq: Once | INTRAVENOUS | Status: AC
  Administered 2018-06-29: 2000 mg via INTRAVENOUS
  Filled 2018-06-29: qty 2000

## 2018-06-29 MED ORDER — PANTOPRAZOLE SODIUM 40 MG PO TBEC
40.0000 mg | DELAYED_RELEASE_TABLET | Freq: Two times a day (BID) | ORAL | Status: DC
  Administered 2018-06-30 – 2018-07-09 (×19): 40 mg via ORAL
  Filled 2018-06-29 (×19): qty 1

## 2018-06-29 MED ORDER — MENTHOL 3 MG MT LOZG: 1.0000 | LOZENGE | OROMUCOSAL | Status: DC | PRN

## 2018-06-29 MED ORDER — GLYCOPYRROLATE 0.2 MG/ML IJ SOLN
INTRAMUSCULAR | Status: DC | PRN
  Administered 2018-06-29: 0.2 mg via INTRAVENOUS

## 2018-06-29 MED ORDER — BUPIVACAINE HCL (PF) 0.5 % IJ SOLN
INTRAMUSCULAR | Status: AC
  Filled 2018-06-29: qty 30

## 2018-06-29 MED ORDER — VANCOMYCIN HCL 1000 MG IV SOLR
INTRAVENOUS | Status: DC | PRN
  Administered 2018-06-29: 1000 mg via TOPICAL

## 2018-06-29 MED ORDER — SODIUM CHLORIDE 0.9 % IV SOLN
INTRAVENOUS | Status: AC
  Filled 2018-06-29: qty 500000

## 2018-06-29 MED ORDER — SODIUM CHLORIDE 0.9% FLUSH: 3.0000 mL | INTRAVENOUS | Status: DC | PRN

## 2018-06-29 MED ORDER — DEXAMETHASONE SODIUM PHOSPHATE 4 MG/ML IJ SOLN
INTRAMUSCULAR | Status: DC | PRN
  Administered 2018-06-29: 5 mg via INTRAVENOUS

## 2018-06-29 MED ORDER — PERFLUTREN LIPID MICROSPHERE
1.0000 mL | INTRAVENOUS | Status: DC | PRN
  Administered 2018-06-29: 3 mL via INTRAVENOUS
  Filled 2018-06-29: qty 10

## 2018-06-29 MED ORDER — ALUM & MAG HYDROXIDE-SIMETH 200-200-20 MG/5ML PO SUSP: 30.0000 mL | Freq: Four times a day (QID) | ORAL | Status: DC | PRN

## 2018-06-29 MED ORDER — SODIUM CHLORIDE 0.9 % IV SOLN
INTRAVENOUS | Status: DC | PRN
  Administered 2018-06-29: 500 mL

## 2018-06-29 MED ORDER — SODIUM CHLORIDE 0.9% FLUSH
3.0000 mL | Freq: Two times a day (BID) | INTRAVENOUS | Status: DC
  Administered 2018-06-30 – 2018-07-08 (×6): 3 mL via INTRAVENOUS

## 2018-06-29 MED ORDER — POVIDONE-IODINE 10 % EX SWAB: 2.0000 "application " | Freq: Once | CUTANEOUS | Status: DC

## 2018-06-29 MED ORDER — PHENOL 1.4 % MT LIQD: 1.0000 | OROMUCOSAL | Status: DC | PRN

## 2018-06-29 MED ORDER — SODIUM CHLORIDE 0.9 % IV SOLN
INTRAVENOUS | Status: DC
  Administered 2018-06-29: 17:00:00 via INTRAVENOUS

## 2018-06-29 MED ORDER — LACTATED RINGERS IV SOLN
INTRAVENOUS | Status: DC | PRN
  Administered 2018-06-29 (×2): via INTRAVENOUS

## 2018-06-29 MED ORDER — ENOXAPARIN SODIUM 30 MG/0.3ML ~~LOC~~ SOLN
30.0000 mg | SUBCUTANEOUS | Status: DC
  Administered 2018-06-30 – 2018-07-09 (×10): 30 mg via SUBCUTANEOUS
  Filled 2018-06-29 (×10): qty 0.3

## 2018-06-29 MED ORDER — FENTANYL CITRATE (PF) 100 MCG/2ML IJ SOLN
INTRAMUSCULAR | Status: DC | PRN
  Administered 2018-06-29: 100 ug via INTRAVENOUS

## 2018-06-29 MED ORDER — SUCCINYLCHOLINE CHLORIDE 20 MG/ML IJ SOLN
INTRAMUSCULAR | Status: DC | PRN
  Administered 2018-06-29: 100 mg via INTRAVENOUS

## 2018-06-29 MED ORDER — TAMSULOSIN HCL 0.4 MG PO CAPS
0.4000 mg | ORAL_CAPSULE | Freq: Every day | ORAL | Status: DC
  Administered 2018-06-29 – 2018-07-09 (×11): 0.4 mg via ORAL
  Filled 2018-06-29 (×11): qty 1

## 2018-06-29 MED ORDER — VANCOMYCIN HCL IN DEXTROSE 1-5 GM/200ML-% IV SOLN: 1000.0000 mg | Freq: Once | INTRAVENOUS | Status: DC

## 2018-06-29 MED ORDER — CHLORHEXIDINE GLUCONATE 4 % EX LIQD
60.0000 mL | Freq: Once | CUTANEOUS | Status: AC
  Administered 2018-06-29: 4 via TOPICAL
  Filled 2018-06-29: qty 15

## 2018-06-29 MED ORDER — FENTANYL CITRATE (PF) 250 MCG/5ML IJ SOLN
INTRAMUSCULAR | Status: AC
  Filled 2018-06-29: qty 5

## 2018-06-29 MED ORDER — SODIUM CHLORIDE 0.9 % IV SOLN: 250.0000 mL | INTRAVENOUS | Status: DC

## 2018-06-29 MED ORDER — PROPOFOL 10 MG/ML IV BOLUS
INTRAVENOUS | Status: DC | PRN
  Administered 2018-06-29: 30 mg via INTRAVENOUS
  Administered 2018-06-29: 120 mg via INTRAVENOUS
  Administered 2018-06-29: 30 mg via INTRAVENOUS

## 2018-06-29 MED ORDER — SODIUM CHLORIDE 0.9 % IV SOLN
2.0000 g | Freq: Three times a day (TID) | INTRAVENOUS | Status: DC
  Administered 2018-06-29 (×2): 2 g via INTRAVENOUS
  Filled 2018-06-29 (×2): qty 2

## 2018-06-29 MED ORDER — MIDAZOLAM HCL 5 MG/5ML IJ SOLN
INTRAMUSCULAR | Status: DC | PRN
  Administered 2018-06-29: 2 mg via INTRAVENOUS

## 2018-06-29 MED ORDER — VITAMIN B-12 1000 MCG PO TABS
1000.0000 ug | ORAL_TABLET | Freq: Every day | ORAL | Status: DC
  Administered 2018-06-30 – 2018-07-09 (×10): 1000 ug via ORAL
  Filled 2018-06-29 (×10): qty 1

## 2018-06-29 MED ORDER — PROPOFOL 10 MG/ML IV BOLUS
INTRAVENOUS | Status: AC
  Filled 2018-06-29: qty 20

## 2018-06-29 MED ORDER — MIDAZOLAM HCL 2 MG/2ML IJ SOLN
INTRAMUSCULAR | Status: AC
  Filled 2018-06-29: qty 2

## 2018-06-29 MED ORDER — DULOXETINE HCL 60 MG PO CPEP
60.0000 mg | ORAL_CAPSULE | Freq: Every day | ORAL | Status: DC
  Administered 2018-06-30 – 2018-07-09 (×10): 60 mg via ORAL
  Filled 2018-06-29 (×10): qty 1

## 2018-06-29 MED ORDER — VANCOMYCIN HCL 1000 MG IV SOLR
INTRAVENOUS | Status: AC
  Filled 2018-06-29: qty 1000

## 2018-06-29 MED ORDER — ROCURONIUM BROMIDE 100 MG/10ML IV SOLN
INTRAVENOUS | Status: DC | PRN
  Administered 2018-06-29: 40 mg via INTRAVENOUS
  Administered 2018-06-29: 10 mg via INTRAVENOUS

## 2018-06-29 MED ORDER — FENTANYL CITRATE (PF) 100 MCG/2ML IJ SOLN: 25.0000 ug | INTRAMUSCULAR | Status: DC | PRN

## 2018-06-29 MED ORDER — SODIUM CHLORIDE 0.9 % IV SOLN
INTRAVENOUS | Status: DC | PRN
  Administered 2018-06-29: 30 ug/min via INTRAVENOUS

## 2018-06-29 MED ORDER — CEFAZOLIN SODIUM-DEXTROSE 2-4 GM/100ML-% IV SOLN
2.0000 g | Freq: Three times a day (TID) | INTRAVENOUS | Status: DC
  Administered 2018-06-29 – 2018-07-09 (×31): 2 g via INTRAVENOUS
  Filled 2018-06-29 (×32): qty 100

## 2018-06-29 MED ORDER — SUGAMMADEX SODIUM 200 MG/2ML IV SOLN
INTRAVENOUS | Status: DC | PRN
  Administered 2018-06-29: 300 mg via INTRAVENOUS

## 2018-06-29 MED ORDER — LIDOCAINE HCL (CARDIAC) PF 100 MG/5ML IV SOSY
PREFILLED_SYRINGE | INTRAVENOUS | Status: DC | PRN
  Administered 2018-06-29: 80 mg via INTRAVENOUS

## 2018-06-29 SURGICAL SUPPLY — 66 items
AIRSTRIP 3X4 (GAUZE/BANDAGES/DRESSINGS) IMPLANT
BANDAGE ACE 6X5 VEL STRL LF (GAUZE/BANDAGES/DRESSINGS) ×4 IMPLANT
BUR RND FLUTED 2.5 (BURR) IMPLANT
BUR SABER RD CUTTING 3.0 (BURR) IMPLANT
BUR SABER RD CUTTING 3.0MM (BURR)
DERMABOND ADVANCED (GAUZE/BANDAGES/DRESSINGS)
DERMABOND ADVANCED .7 DNX12 (GAUZE/BANDAGES/DRESSINGS) IMPLANT
DRAPE HALF SHEET 40X57 (DRAPES) IMPLANT
DRAPE INCISE IOBAN 66X45 STRL (DRAPES) IMPLANT
DRAPE MICROSCOPE LEICA (MISCELLANEOUS) ×4 IMPLANT
DRAPE POUCH INSTRU U-SHP 10X18 (DRAPES) ×4 IMPLANT
DRAPE SURG 17X23 STRL (DRAPES) ×16 IMPLANT
DRSG ADAPTIC 3X8 NADH LF (GAUZE/BANDAGES/DRESSINGS) ×4 IMPLANT
DRSG MEPILEX BORDER 4X4 (GAUZE/BANDAGES/DRESSINGS) IMPLANT
DRSG MEPILEX BORDER 4X8 (GAUZE/BANDAGES/DRESSINGS) IMPLANT
DRSG VAC ATS MED SENSATRAC (GAUZE/BANDAGES/DRESSINGS) ×4 IMPLANT
DURAPREP 26ML APPLICATOR (WOUND CARE) ×4 IMPLANT
ELECT BLADE 4.0 EZ CLEAN MEGAD (MISCELLANEOUS) ×4
ELECT CAUTERY BLADE 6.4 (BLADE) ×4 IMPLANT
ELECT REM PT RETURN 9FT ADLT (ELECTROSURGICAL) ×4
ELECTRODE BLDE 4.0 EZ CLN MEGD (MISCELLANEOUS) ×2 IMPLANT
ELECTRODE REM PT RTRN 9FT ADLT (ELECTROSURGICAL) ×2 IMPLANT
EVACUATOR 1/8 PVC DRAIN (DRAIN) ×4 IMPLANT
GAUZE SPONGE 4X4 12PLY STRL LF (GAUZE/BANDAGES/DRESSINGS) ×4 IMPLANT
GAUZE SPONGE 4X4 16PLY XRAY LF (GAUZE/BANDAGES/DRESSINGS) ×4 IMPLANT
GLOVE BIOGEL PI IND STRL 8 (GLOVE) ×2 IMPLANT
GLOVE BIOGEL PI INDICATOR 8 (GLOVE) ×2
GLOVE ECLIPSE 9.0 STRL (GLOVE) ×4 IMPLANT
GLOVE ORTHO TXT STRL SZ7.5 (GLOVE) ×4 IMPLANT
GLOVE SURG 8.5 LATEX PF (GLOVE) ×4 IMPLANT
GOWN STRL REUS W/ TWL LRG LVL3 (GOWN DISPOSABLE) ×2 IMPLANT
GOWN STRL REUS W/TWL 2XL LVL3 (GOWN DISPOSABLE) ×8 IMPLANT
GOWN STRL REUS W/TWL LRG LVL3 (GOWN DISPOSABLE) ×2
KIT BASIN OR (CUSTOM PROCEDURE TRAY) ×4 IMPLANT
KIT TURNOVER KIT B (KITS) ×4 IMPLANT
MANIFOLD NEPTUNE II (INSTRUMENTS) ×4 IMPLANT
NEEDLE 22X1 1/2 (OR ONLY) (NEEDLE) ×4 IMPLANT
NEEDLE SPNL 18GX3.5 QUINCKE PK (NEEDLE) ×8 IMPLANT
NS IRRIG 1000ML POUR BTL (IV SOLUTION) ×4 IMPLANT
PACK LAMINECTOMY ORTHO (CUSTOM PROCEDURE TRAY) ×4 IMPLANT
PAD ABD 8X10 STRL (GAUZE/BANDAGES/DRESSINGS) ×4 IMPLANT
PAD ARMBOARD 7.5X6 YLW CONV (MISCELLANEOUS) ×16 IMPLANT
PAD NEG PRESSURE SENSATRAC (MISCELLANEOUS) ×4 IMPLANT
PADDING CAST COTTON 6X4 STRL (CAST SUPPLIES) ×4 IMPLANT
PATTIES SURGICAL .5 X.5 (GAUZE/BANDAGES/DRESSINGS) IMPLANT
PATTIES SURGICAL .75X.75 (GAUZE/BANDAGES/DRESSINGS) IMPLANT
PATTIES SURGICAL 1X1 (DISPOSABLE) IMPLANT
SPONGE LAP 4X18 RFD (DISPOSABLE) ×4 IMPLANT
SPONGE SURGIFOAM ABS GEL 100 (HEMOSTASIS) IMPLANT
SUT ETHILON 5 0 PS 2 18 (SUTURE) ×4 IMPLANT
SUT VIC AB 0 CT1 27 (SUTURE) ×4
SUT VIC AB 0 CT1 27XBRD ANBCTR (SUTURE) ×4 IMPLANT
SUT VIC AB 1 CT1 27 (SUTURE) ×4
SUT VIC AB 1 CT1 27XBRD ANBCTR (SUTURE) ×4 IMPLANT
SUT VIC AB 2-0 CT1 27 (SUTURE)
SUT VIC AB 2-0 CT1 TAPERPNT 27 (SUTURE) IMPLANT
SUT VIC AB 3-0 FS2 27 (SUTURE) ×4 IMPLANT
SUT VIC AB 3-0 X1 27 (SUTURE) ×4 IMPLANT
SUT VICRYL 0 UR6 27IN ABS (SUTURE) IMPLANT
SYR CONTROL 10ML LL (SYRINGE) ×4 IMPLANT
TOWEL OR 17X24 6PK STRL BLUE (TOWEL DISPOSABLE) ×4 IMPLANT
TOWEL OR 17X26 10 PK STRL BLUE (TOWEL DISPOSABLE) ×4 IMPLANT
TRAY FOLEY MTR SLVR 16FR STAT (SET/KITS/TRAYS/PACK) IMPLANT
WATER STERILE IRR 1000ML POUR (IV SOLUTION) ×4 IMPLANT
WND VAC CANISTER 500ML (MISCELLANEOUS) ×4 IMPLANT
YANKAUER SUCT BULB TIP NO VENT (SUCTIONS) ×8 IMPLANT

## 2018-06-29 NOTE — Progress Notes (Signed)
     Subjective:   I feel better in my legs today. Patient has foley to SD, results of UA 6-10 WBC phpf, moderate Hgb. Urine culture is pending. Blood cultures pending. Did revieve spinal cord injury protocol steroid bolus and was on 24 hours IV steriods until Aspiration of lumbar fluid collection yesterday gram stain with gram+cocci in clusters and pairs. Started Vancomycin/Maxipime early this AM. Steroid IV was discontinued.   Patient reports pain as moderate.    Objective:   VITALS:  Temp:  [97.5 F (36.4 C)-99.3 F (37.4 C)] 97.5 F (36.4 C) (09/16 0405) Pulse Rate:  [74-134] 74 (09/16 0405) Resp:  [17-20] 18 (09/16 0405) BP: (111-170)/(77-117) 131/85 (09/16 0405) SpO2:  [95 %-100 %] 99 % (09/16 0405) Weight:  [147.8 kg] 147.8 kg (09/15 1424)  ABD soft Intact pulses distally Compartment soft Motor right foot DF0/5, left 4/5, left knee 4/5 these are an improvement compared with yesterday in the ER   LABS Recent Labs    06/28/18 0002 06/29/18 0310  HGB 14.2 13.5  WBC 13.5* 12.6*  PLT 137* 144*   Recent Labs    06/28/18 0002 06/29/18 0310  NA 141 141  K 4.0 3.6  CL 101 107  CO2 27 24  BUN 26* 42*  CREATININE 1.77* 2.12*  GLUCOSE 147* 187*   Recent Labs    06/28/18 1410  INR 1.05  Radiographs left knee with severe osteoarthritis. Radiographs left shoulder with moderately severe DJD of  AC joint, mild glenoid arthrosis changes and hypertrophic changes of the left greater tuberosity. CXR no acute cardiopulmonary process. Sed rate, CBC with diff, Procalcitonin all suggest infectious process. Gram stain of lumbar fluid collection is showing gram +cocci in pairs and clusters.  MRI of thoracic area show holocord T2 signal consistent  Myelomalacia done 06/27/2018 Moderately severe stenosis due to disc degeneration and spondylosis T7 to T10, MRI of cervical spine done last evening with areas of T2  Signal within cervical cord at C4-5 and C5-6. Moderate and Severe  cord compression due to disc osteophytes at C4-5, C5-6 and C6-7.    Assessment/Plan: Likely combined myelopathy of cervical and thoracic areas and lumbar radiculopathy. Lumbar abscess likely due to staph organism. Creatinine and BUN are elevated today may be due to sepsis. Indwelling foley catheter, I suspect urinary retention, foley placed in ER UA with mild changes 6-10 WBC/hpf Morbid obesity. Plan: Day #1 antibiotics, I have requested ID consult with Dr. Vernia BuffJeffery Hatcher. Will plan to perform I&D of  Lumbar area tomorrow.  Cervical and thoracic areas of myelomalacia will need to be addressed but infection of lumbar area prevents Surgical treatment. . Increased creatinine may reflect response to lumbar infection vs urinary retention( obstructive nephropathy Probable neurogenic bladder. I will get urology evaluation.      Antibiiotics for lumbar infection. Will schedule for Incision, drainage and debridement of the lumbar abscess. Medicine and ID and Urology consults. Will probably need SNF vs In patient CIR.    Vira BrownsJames Malic Rosten 06/29/2018, 10:06 AMPatient ID: [name] JunErnest Njoku, male   DOB: 01-05-1956, 62 y.o.   MRN: [id number]

## 2018-06-29 NOTE — Transfer of Care (Signed)
Immediate Anesthesia Transfer of Care Note  Patient: [name] [name]  Procedure(s) Performed: LUMBAR WOUND DEBRIDEMENT DRAINAGE AND IRRIGATION; AND ASPIRATION OF LEFT KNEE (N/A ) ARTHROSCOPY KNEE (Left )  Patient Location: PACU  Anesthesia Type:General  Level of Consciousness: oriented, drowsy and patient cooperative  Airway & Oxygen Therapy: Patient Spontanous Breathing  Post-op Assessment: Report given to RN and Post -op Vital signs reviewed and stable  Post vital signs: Reviewed and stable  Last Vitals:  Vitals Value Taken Time  BP 101/56 06/29/2018  9:33 PM  Temp 36.6 C 06/29/2018  9:24 PM  Pulse 89 06/29/2018  9:33 PM  Resp 14 06/29/2018  9:33 PM  SpO2 97 % 06/29/2018  9:33 PM  Vitals shown include unvalidated device data.  Last Pain:  Vitals:   06/29/18 1314  TempSrc: Oral  PainSc:          Complications: No apparent anesthesia complications

## 2018-06-29 NOTE — Anesthesia Procedure Notes (Signed)
Procedure Name: Intubation Date/Time: 06/29/2018 6:33 PM Performed by: Oletta Lamas, CRNA Pre-anesthesia Checklist: Patient identified, Emergency Drugs available, Suction available and Patient being monitored Patient Re-evaluated:Patient Re-evaluated prior to induction Oxygen Delivery Method: Circle System Utilized Preoxygenation: Pre-oxygenation with 100% oxygen Induction Type: IV induction and Cricoid Pressure applied Ventilation: Mask ventilation without difficulty Laryngoscope Size: Mac, 4, Miller and 3 Grade View: Grade III Tube type: Oral Tube size: 7.5 mm Number of attempts: 1 Airway Equipment and Method: Stylet Placement Confirmation: ETT inserted through vocal cords under direct vision,  positive ETCO2 and breath sounds checked- equal and bilateral Secured at: 23 cm Tube secured with: Tape Dental Injury: Teeth and Oropharynx as per pre-operative assessment

## 2018-06-29 NOTE — Progress Notes (Signed)
  Echocardiogram 2D Echocardiogram has been performed.  Dorena Dewiffany G Kyandra Mcclaine 06/29/2018, 4:37 PM

## 2018-06-29 NOTE — Progress Notes (Signed)
Assistd echo tech with 2D echo. Pushed 3ml of definity per orders. Pt tolerated well. IV to R forearm flushes well after procedure. No complications observed or reported.

## 2018-06-29 NOTE — Consult Note (Signed)
[facility]    Date of Admission:  06/27/2018     Total days of antibiotics                Reason for Consult: Lumbar abscess  Referring Provider: Otelia Sergeant Primary Care Provider: Renford Dills, MD   Assessment/Plan: Epidural Abscess L2-5 Lumbar Prosthesis MSSA bacteremia Constipation AKI on CKD  Will continue ancef Not clear he has hardware (none on MRI), so will hold rifampin Will have CV eval for TEE Will repeat BCx   Principal Problem:   Spondylosis, thoracic, with myelopathy Active Problems:   Thoracic myelopathy   Abscess in epidural space of L2-L5 lumbar spine   Acute urinary retention   Radiculopathy, cervical region   . acetaminophen  1,000 mg Oral Q6H  . docusate sodium  100 mg Oral BID  . HYDROcodone-acetaminophen  1 tablet Oral Q6H     HPI: [name] [name] is a 62 y.o. male with previous history listed below with relevant history including sleep apnea and spinal stenosis with previous lumbar laminectomy in the past (2011, and redo 11-2017) who was admitted following a fall while attempting to get out of bed. He had been experiencing several months of worsening right lower back pain with radiation to both legs and a numb feeling. MRI of lumbar and thoracic with a 2.9 x 4.3 x 6.1 cm fluid collection with right L4-5 facet effusion. Lumbar abscess cultures and blood cultures positive for Staphylococcus aureus. CRP is elevated at 30.   Review of Systems: Review of Systems  Constitutional: Positive for chills and fever.  Eyes: Negative for blurred vision.  Respiratory: Positive for shortness of breath. Negative for cough.   Gastrointestinal: Positive for constipation.  Genitourinary: Negative for dysuria.  Musculoskeletal: Positive for back pain.  Neurological: Positive for weakness.  Please see HPI. All other systems reviewed and negative.    Past Medical History:  Diagnosis Date  . Anemia   . Arthritis   . Chronic kidney  disease    HX acute kidney failure / acute pyelonephritis / hydronephrosis / severe sepsis per discharge summary 12/27/13  . History of kidney stones   . Hypertension   . Morbid obesity (HCC)   . Obstructive sleep apnea    does not need c pap since 110 lb wt loss  . Osteoporosis   . Prurigo 2002  . Scars    ON ARMS FROM CHEMICAL EXPLOSION 1999  . Spinal stenosis     Social History   Tobacco Use  . Smoking status: Former Smoker    Types: Cigarettes    Last attempt to quit: 04/09/1974    Years since quitting: 44.2  . Smokeless tobacco: Never Used  Substance Use Topics  . Alcohol use: No  . Drug use: No    History reviewed. No pertinent family history.  Allergies  Allergen Reactions  . Unasyn [Ampicillin-Sulbactam Sodium] Hives, Itching, Swelling and Rash    OBJECTIVE: Blood pressure 131/85, pulse 74, temperature (!) 97.5 F (36.4 C), temperature source Oral, resp. rate 18, height 5\' 10"  (1.778 m), weight (!) 147.8 kg, SpO2 99 %.  Physical Exam  Constitutional: He appears well-developed and well-nourished.  HENT:  Mouth/Throat: No oropharyngeal exudate.  Eyes: Pupils are equal, round, and reactive to light. EOM are normal.  Neck: Normal range of motion. Neck supple.  Cardiovascular: Normal rate, regular rhythm and normal heart sounds.  No murmur heard. Pulmonary/Chest: Effort normal and breath sounds normal.  Abdominal: Soft.  Bowel sounds are normal. He exhibits distension.  Lymphadenopathy:    He has no cervical adenopathy.  Neurological: He exhibits abnormal muscle tone.  Decreased strength BLE, 2/5 plantar flexion, 0/5 dorsiflexion.     Lab Results Lab Results  Component Value Date   WBC 12.6 (H) 06/29/2018   HGB 13.5 06/29/2018   HCT 42.6 06/29/2018   MCV 88.4 06/29/2018   PLT 144 (L) 06/29/2018    Lab Results  Component Value Date   CREATININE 2.12 (H) 06/29/2018   BUN 42 (H) 06/29/2018   NA 141 06/29/2018   K 3.6 06/29/2018   CL 107 06/29/2018    CO2 24 06/29/2018    Lab Results  Component Value Date   ALT 28 06/29/2018   AST 59 (H) 06/29/2018   ALKPHOS 99 06/29/2018   BILITOT 1.6 (H) 06/29/2018     Microbiology: Recent Results (from the past 240 hour(s))  Culture, blood (routine x 2)     Status: None (Preliminary result)   Collection Time: 06/28/18  3:30 PM  Result Value Ref Range Status   Specimen Description BLOOD LEFT ANTECUBITAL  Final   Special Requests   Final    BOTTLES DRAWN AEROBIC AND ANAEROBIC Blood Culture adequate volume   Culture  Setup Time   Final    GRAM POSITIVE COCCI IN BOTH AEROBIC AND ANAEROBIC BOTTLES CRITICAL RESULT CALLED TO, READ BACK BY AND VERIFIED WITH: Evelena PeatG. ABBOTT PHARMD, AT 0724 06/29/18 BY Renato Shin. VANHOOK Performed at [facility], [address] [place], [place] [address]    Culture PENDING  Incomplete   Report Status PENDING  Incomplete  Culture, blood (routine x 2)     Status: None (Preliminary result)   Collection Time: 06/28/18  3:35 PM  Result Value Ref Range Status   Specimen Description BLOOD LEFT HAND  Final   Special Requests   Final    BOTTLES DRAWN AEROBIC AND ANAEROBIC Blood Culture adequate volume   Culture  Setup Time   Final    GRAM POSITIVE COCCI IN BOTH AEROBIC AND ANAEROBIC BOTTLES CRITICAL RESULT CALLED TO, READ BACK BY AND VERIFIED WITH: Lieutenant DiegoG. ABBOTT PHARMD, AT 0724 06/29/18 BY Renato Shin. VANHOOK Performed at [facility], [address] [place], [place] [address]    Culture GRAM POSITIVE COCCI  Final   Report Status PENDING  Incomplete  Blood Culture ID Panel (Reflexed)     Status: Abnormal   Collection Time: 06/28/18  3:35 PM  Result Value Ref Range Status   Enterococcus species NOT DETECTED NOT DETECTED Final   Listeria monocytogenes NOT DETECTED NOT DETECTED Final   Staphylococcus species DETECTED (A) NOT DETECTED Final    Comment: CRITICAL RESULT CALLED TO, READ BACK BY AND VERIFIED WITH: G. ABBOTT PHARMD, AT 0724 06/29/18 BY D. VANHOOK    Staphylococcus  aureus DETECTED (A) NOT DETECTED Final    Comment: Methicillin (oxacillin) susceptible Staphylococcus aureus (MSSA). Preferred therapy is anti staphylococcal beta lactam antibiotic (Cefazolin or Nafcillin), unless clinically contraindicated. CRITICAL RESULT CALLED TO, READ BACK BY AND VERIFIED WITH: G. ABBOTT PHARMD, AT 0724 06/29/18 BY D. VANHOOK    Methicillin resistance NOT DETECTED NOT DETECTED Final   Streptococcus species NOT DETECTED NOT DETECTED Final   Streptococcus agalactiae NOT DETECTED NOT DETECTED Final   Streptococcus pneumoniae NOT DETECTED NOT DETECTED Final   Streptococcus pyogenes NOT DETECTED NOT DETECTED Final   Acinetobacter baumannii NOT DETECTED NOT DETECTED Final   Enterobacteriaceae species NOT DETECTED NOT DETECTED Final  Enterobacter cloacae complex NOT DETECTED NOT DETECTED Final   Escherichia coli NOT DETECTED NOT DETECTED Final   Klebsiella oxytoca NOT DETECTED NOT DETECTED Final   Klebsiella pneumoniae NOT DETECTED NOT DETECTED Final   Proteus species NOT DETECTED NOT DETECTED Final   Serratia marcescens NOT DETECTED NOT DETECTED Final   Haemophilus influenzae NOT DETECTED NOT DETECTED Final   Neisseria meningitidis NOT DETECTED NOT DETECTED Final   Pseudomonas aeruginosa NOT DETECTED NOT DETECTED Final   Candida albicans NOT DETECTED NOT DETECTED Final   Candida glabrata NOT DETECTED NOT DETECTED Final   Candida krusei NOT DETECTED NOT DETECTED Final   Candida parapsilosis NOT DETECTED NOT DETECTED Final   Candida tropicalis NOT DETECTED NOT DETECTED Final    Comment: Performed at [facility], [address] [place], [place] [address]  Aerobic/Anaerobic Culture (surgical/deep wound)     Status: None (Preliminary result)   Collection Time: 06/28/18  5:01 PM  Result Value Ref Range Status   Specimen Description ABSCESS LUMBAR  Final   Special Requests NONE  Final   Gram Stain   Final    ABUNDANT WBC PRESENT, PREDOMINANTLY PMN ABUNDANT GRAM  POSITIVE COCCI IN PAIRS IN CLUSTERS    Culture   Final    MODERATE STAPHYLOCOCCUS AUREUS SUSCEPTIBILITIES TO FOLLOW Performed at [facility], [address], [place], [place] [address]    Report Status PENDING  Incomplete     Marcos Eke, NP [facility] [phone] Pager  06/29/2018        [place] [facility] Staphylococcus aureus bacteremia   Staphylococcus aureus bacteremia (SAB) is associated with a high rate of complications and mortality.  Specific aspects of clinical management are critical to optimizing the outcome of patients with SAB.  Therefore, the [facility] Antimicrobial Management Team [facility]) has initiated an intervention aimed at improving the management of SAB at [facility].  To do so, Infectious Diseases physicians are providing an evidence-based consult for the management of all patients with SAB.     Yes No Comments  Perform follow-up blood cultures (even if the patient is afebrile) to ensure clearance of bacteremia [x]  []    Remove vascular catheter and obtain follow-up blood cultures after the removal of the catheter []  [x]    Perform echocardiography to evaluate for endocarditis (transthoracic ECHO is 40-50% sensitive, TEE is > 90% sensitive) [x]  []  Please keep in mind, that neither test can definitively EXCLUDE endocarditis, and that should clinical suspicion remain high for endocarditis the patient should then still be treated with an "endocarditis" duration of therapy = 6 weeks  Consult electrophysiologist to evaluate implanted cardiac device (pacemaker, ICD) []  [x]    Ensure source control [x]  []  Have all abscesses been drained effectively? Have deep seeded infections (septic joints or osteomyelitis) had appropriate surgical debridement?  Investigate for "metastatic" sites of infection [x]  []  Does the patient have ANY symptom or physical exam finding that would suggest a deeper  infection (back or neck pain that may be suggestive of vertebral osteomyelitis or epidural abscess, muscle pain that could be a symptom of pyomyositis)?  Keep in mind that for deep seeded infections MRI imaging with contrast is preferred rather than other often insensitive tests such as plain x-rays, especially early in a patient's presentation.  Change antibiotic therapy to __________________ [x]  []  Beta-lactam antibiotics are preferred for MSSA due to higher cure rates.   If on Vancomycin, goal trough should be 15 -  20 mcg/mL  Estimated duration of IV antibiotic therapy:   []  []  Consult case management for probably prolonged outpatient IV antibiotic therapy     10:27 AM

## 2018-06-29 NOTE — Plan of Care (Signed)

## 2018-06-29 NOTE — Brief Op Note (Signed)
06/29/2018  9:22 PM  PATIENT:  [name] [name]  62 y.o. male  PRE-OPERATIVE DIAGNOSIS:  LUMBAR ABCESS, FLUID ON THE LEFT KNEE  POST-OPERATIVE DIAGNOSIS:  LUMBAR ABCESS, FLUID ON THE LEFT KNEE  PROCEDURE:  Procedure(s): LUMBAR WOUND DEBRIDEMENT DRAINAGE AND IRRIGATION; AND ASPIRATION OF LEFT KNEE (N/A) ARTHROSCOPY KNEE (Left)  SURGEON:  Surgeon(s) and Role:    * Kerrin ChampagneNitka, Winnie Barsky E, MD - Primary  PHYSICIAN ASSISTANT:Mamta Rimmer Michaelyn Barterwen PA-C for Lumbar abscess incision drainage and excisional debridement .  ANESTHESIA:   general, Dr. Maple HudsonMoser.  EBL:  150cc   BLOOD ADMINISTERED:none  DRAINS: (4mm) Hemovact drain(s) in the Left medial knee sewn in place. with  Suction Open, Urinary Catheter (Foley) and Vac wound managment system to the lumbar spine.    LOCAL MEDICATIONS USED:  Vancomycin powder 1 gram to lumbar incision.  SPECIMEN:  Source of Specimen:  Swab of purulent drainage lumbar spine abscess for anaerobic and aerobic C and S. Left aspirate sent for stat gram stain and cell count and culture and sensitivities.  DISPOSITION OF SPECIMEN:  Microbiology for cultures of lumbar abscess and stat gram stain.   COUNTS:  YES  TOURNIQUET:  * No tourniquets in log *  DICTATION: .Dragon Dictation  PLAN OF CARE: Admit to inpatient   PATIENT DISPOSITION:  PACU - hemodynamically stable.   Delay start of Pharmacological VTE agent (>24hrs) due to surgical blood loss or risk of bleeding: no

## 2018-06-29 NOTE — Consult Note (Signed)
Reason for Consult: Weakness Referring Physician: [name] is an 62 y.o. male.  HPI: 62 year old male presents with worsening bilateral lower extremity weakness.  Patient situation is complicated and long-standing.  He states that he has had difficulty ambulating since 2009.  He has been unable to ambulate without physical aids since that time.  The patient underwent lumbar decompressive surgery for worsening pain and weakness a few years ago with good results.  The patient recovered significant strength and had good pain relief after decompressive surgery but still required a walker and significant assist for ambulation.  The patient's symptoms gradually returned.  He underwent redo decompressive surgery in February of this year.  Again his pain was improved postop.  He was once again walking with a walker but still very limited.  The patient does note that he has difficulty with controlling his legs.  He also has upper extremity numbness paresthesias and weakness particularly in his left arm.  The symptoms have been long-standing.  The patient reports suffering a fall approximately 5 days ago.  Shortly after this fall his lower extremity weakness became much more severe as did his pain.  He was admitted to the hospital.  Follow-up MRI scanning demonstrates a dorsal fluid collection worrisome for abscess with some element of hemorrhage.  There is no new evidence of fracture dislocation.  The patient does have severe facet arthropathy on the right at L4-5 with some residual stenosis.  I have been asked to see the patient with regard to his cervical and thoracic stenosis.  The patient does have evidence of significant cervical stenosis which is worse at the C5-6 level but also significant at C3-4 and C45.  There is high signal present at C5-6 consistent with chronic myelomalacia.  He has some foraminal narrowing at C6-7 but no high-grade central stenosis at this level.  I have also reviewed the  patient's thoracic imaging.  The patient has severe multifactorial stenosis at T9-T10 with spinal cord signal abnormality consistent with chronic myelomalacia.  He also has significant disc degeneration and a moderately severe stenosis at T7-T8.  He also has some stenosis at T5-T6.  Past Medical History:  Diagnosis Date  . Anemia   . Arthritis   . Chronic kidney disease    HX acute kidney failure / acute pyelonephritis / hydronephrosis / severe sepsis per discharge summary 12/27/13  . History of kidney stones   . Hypertension   . Morbid obesity ([place])   . Obstructive sleep apnea    does not need c pap since 110 lb wt loss  . Osteoporosis   . Prurigo 2002  . Scars    ON ARMS FROM CHEMICAL EXPLOSION 1999  . Spinal stenosis     Past Surgical History:  Procedure Laterality Date  . BACK SURGERY  01/23/2010   lumbar  . CHOLECYSTECTOMY N/A 03/24/2016   Procedure: LAPAROSCOPIC CHOLECYSTECTOMY WITH INTRAOPERATIVE CHOLANGIOGRAM;  Surgeon: Autumn Messing III, MD;  Location: [facility];  Service: General;  Laterality: N/A;  . CIRCUMCISION    . CYSTOSCOPY WITH RETROGRADE PYELOGRAM, URETEROSCOPY AND STENT PLACEMENT Left 01/20/2014   Procedure: CYSTOSCOPY WITH RETROGRADE PYELOGRAM, URETEROSCOPY AND STENT EXCHANGE;  Surgeon: Molli Hazard, MD;  Location: [facility];  Service: Urology;  Laterality: Left;  bugbee bladder fulguration  . CYSTOSCOPY WITH STENT PLACEMENT Left 12/28/2013   Procedure: CYSTOSCOPY WITH STENT PLACEMENT left retrograde pyleogram;  Surgeon: Molli Hazard, MD;  Location: [facility];  Service: Urology;  Laterality: Left;  .  ESOPHAGOGASTRODUODENOSCOPY N/A 12/07/2012   Procedure: ESOPHAGOGASTRODUODENOSCOPY (EGD);  Surgeon: Madilyn Hook, DO;  Location: [facility];  Service: General;  Laterality: N/A;  . HOLMIUM LASER APPLICATION Left 12/21/7671   Procedure: HOLMIUM LASER APPLICATION;  Surgeon: Molli Hazard, MD;  Location[facility];  Service: Urology;  Laterality: Left;  . LAPAROSCOPIC  GASTRIC SLEEVE RESECTION N/A 12/07/2012   Procedure: LAPAROSCOPIC GASTRIC SLEEVE RESECTION;  Surgeon: Madilyn Hook, DO;  Location[facility];  Service: General;  Laterality: N/A;  laparoscopic sleeve gastrectomy with EGD  . LUMBAR LAMINECTOMY N/A 11/17/2017   Procedure: L2-3 LAMINECTOMY AND REDO LAMINECTOMIES  L3-4, L4-5 AND L5-S1;  Surgeon: Jessy Oto, MD;  Location: [facility];  Service: Orthopedics;  Laterality: N/A;    History reviewed. No pertinent family history.  Social History:  reports that he quit smoking about 44 years ago. His smoking use included cigarettes. He has never used smokeless tobacco. He reports that he does not drink alcohol or use drugs.  Allergies:  Allergies  Allergen Reactions  . Unasyn [Ampicillin-Sulbactam Sodium] Hives, Itching, Swelling and Rash    Medications: I have reviewed the patient's current medications.  Results for orders placed or performed during the hospital encounter of 06/27/18 (from the past 48 hour(s))  CBC with Differential/Platelet     Status: Abnormal   Collection Time: 06/28/18 12:02 AM  Result Value Ref Range   WBC 13.5 (H) 4.0 - 10.5 K/uL   RBC 4.98 4.22 - 5.81 MIL/uL   Hemoglobin 14.2 13.0 - 17.0 g/dL   HCT 44.5 39.0 - 52.0 %   MCV 89.4 78.0 - 100.0 fL   MCH 28.5 26.0 - 34.0 pg   MCHC 31.9 30.0 - 36.0 g/dL   RDW 15.6 (H) 11.5 - 15.5 %   Platelets 137 (L) 150 - 400 K/uL   Neutrophils Relative % 93 %   Lymphocytes Relative 2 %   Monocytes Relative 5 %   Eosinophils Relative 0 %   Basophils Relative 0 %   Neutro Abs 12.5 (H) 1.7 - 7.7 K/uL   Lymphs Abs 0.3 (L) 0.7 - 4.0 K/uL   Monocytes Absolute 0.7 0.1 - 1.0 K/uL   Eosinophils Absolute 0.0 0.0 - 0.7 K/uL   Basophils Absolute 0.0 0.0 - 0.1 K/uL   WBC Morphology MILD LEFT SHIFT (1-5% METAS, OCC MYELO, OCC BANDS)     Comment: Performed at [facility], [address], [place], [place] [address]  Comprehensive metabolic panel     Status: Abnormal   Collection Time: 06/28/18  12:02 AM  Result Value Ref Range   Sodium 141 135 - 145 mmol/L   Potassium 4.0 3.5 - 5.1 mmol/L    Comment: SLIGHT HEMOLYSIS   Chloride 101 98 - 111 mmol/L   CO2 27 22 - 32 mmol/L   Glucose, Bld 147 (H) 70 - 99 mg/dL   BUN 26 (H) 6 - 20 mg/dL   Creatinine, Ser 1.77 (H) 0.61 - 1.24 mg/dL   Calcium 9.0 8.9 - 10.3 mg/dL   Total Protein 6.9 6.5 - 8.1 g/dL   Albumin 3.0 (L) 3.5 - 5.0 g/dL   AST 47 (H) 15 - 41 U/L   ALT 21 0 - 44 U/L   Alkaline Phosphatase 93 38 - 126 U/L   Total Bilirubin 1.7 (H) 0.3 - 1.2 mg/dL   GFR calc non Af Amer 40 (L) >60 mL/min   GFR calc Af Amer 46 (L) >60 mL/min    Comment: (NOTE) The eGFR  has been calculated using the CKD EPI equation. This calculation has not been validated in all clinical situations. eGFR's persistently <60 mL/min signify possible Chronic Kidney Disease.    Anion gap 13 5 - 15    Comment: Performed at [facility] [address] [place], [place] [address]  C-reactive protein     Status: Abnormal   Collection Time: 06/28/18  9:15 AM  Result Value Ref Range   CRP 30.9 (H) <1.0 mg/dL    Comment: Performed at [facility] [address] [place], [place] [address]  Sedimentation rate     Status: Abnormal   Collection Time: 06/28/18  9:15 AM  Result Value Ref Range   Sed Rate 56 (H) 0 - 16 mm/hr    Comment: Performed at [facility] [address], [place], [place] [address]  Urinalysis, Routine w reflex microscopic     Status: Abnormal   Collection Time: 06/28/18 11:57 AM  Result Value Ref Range   Color, Urine AMBER (A) YELLOW    Comment: BIOCHEMICALS MAY BE AFFECTED BY COLOR   APPearance HAZY (A) CLEAR   Specific Gravity, Urine 1.025 1.005 - 1.030   pH 5.0 5.0 - 8.0   Glucose, UA NEGATIVE NEGATIVE mg/dL   Hgb urine dipstick MODERATE (A) NEGATIVE   Bilirubin Urine NEGATIVE NEGATIVE   Ketones, ur NEGATIVE NEGATIVE mg/dL   Protein, ur 30 (A) NEGATIVE mg/dL   Nitrite NEGATIVE NEGATIVE   Leukocytes, UA NEGATIVE  NEGATIVE   RBC / HPF 0-5 0 - 5 RBC/hpf   WBC, UA 6-10 0 - 5 WBC/hpf   Bacteria, UA RARE (A) NONE SEEN   Squamous Epithelial / LPF 0-5 0 - 5   Mucus PRESENT    Sperm, UA PRESENT     Comment: Performed at [facility], [address], [place], [place] [address]  Culture, Urine     Status: Abnormal   Collection Time: 06/28/18 11:59 AM  Result Value Ref Range   Specimen Description URINE, CATHETERIZED    Special Requests      Normal Performed at [facility] [address], [place], [place] [address]    Culture MULTIPLE SPECIES PRESENT, SUGGEST RECOLLECTION (A)    Report Status 06/29/2018 FINAL   HIV antibody (Routine Testing)     Status: None   Collection Time: 06/28/18  2:10 PM  Result Value Ref Range   HIV Screen 4th Generation wRfx Non Reactive Non Reactive    Comment: (NOTE) Performed At: [facility] [place], [place] [id number] Rush Farmer MD PP:[phone]   Protime-INR     Status: None   Collection Time: 06/28/18  2:10 PM  Result Value Ref Range   Prothrombin Time 13.7 11.4 - 15.2 seconds   INR 1.05     Comment: Performed at [facility], [place] [address], [place],  [address]  Procalcitonin - Baseline     Status: None   Collection Time: 06/28/18  2:10 PM  Result Value Ref Range   Procalcitonin 3.69 ng/mL    Comment:        Interpretation: PCT > 2 ng/mL: Systemic infection (sepsis) is likely, unless other causes are known. (NOTE)       Sepsis PCT Algorithm           Lower Respiratory Tract  Infection PCT Algorithm    ----------------------------     ----------------------------         PCT < 0.25 ng/mL                PCT < 0.10 ng/mL         Strongly encourage             Strongly discourage   discontinuation of antibiotics    initiation of antibiotics    ----------------------------     -----------------------------       PCT 0.25 - 0.50 ng/mL            PCT 0.10 - 0.25 ng/mL                OR       >80% decrease in PCT            Discourage initiation of                                            antibiotics      Encourage discontinuation           of antibiotics    ----------------------------     -----------------------------         PCT >= 0.50 ng/mL              PCT 0.26 - 0.50 ng/mL               AND       <80% decrease in PCT              Encourage initiation of                                             antibiotics       Encourage continuation           of antibiotics    ----------------------------     -----------------------------        PCT >= 0.50 ng/mL                  PCT > 0.50 ng/mL               AND         increase in PCT                  Strongly encourage                                      initiation of antibiotics    Strongly encourage escalation           of antibiotics                                     -----------------------------                                           PCT <= 0.25 ng/mL  OR                                        > 80% decrease in PCT                                     Discontinue / Do not initiate                                             antibiotics Performed at [facility], [place] [address] [place], [place] [address]   Culture, blood (routine x 2)     Status: None (Preliminary result)   Collection Time: 06/28/18  3:30 PM  Result Value Ref Range   Specimen Description BLOOD LEFT ANTECUBITAL    Special Requests      BOTTLES DRAWN AEROBIC AND ANAEROBIC Blood Culture adequate volume   Culture  Setup Time      GRAM POSITIVE COCCI IN BOTH AEROBIC AND ANAEROBIC BOTTLES CRITICAL RESULT CALLED TO, READ BACK BY AND VERIFIED WITH: D[place] Brick PHARMD, AT 0724 06/29/18 BY D. VANHOOK    Culture      NO GROWTH < 24 HOURS Performed at [facility], [place] [address], [place], [place] [address]    Report Status PENDING   Culture, blood (routine x 2)      Status: None (Preliminary result)   Collection Time: 06/28/18  3:35 PM  Result Value Ref Range   Specimen Description BLOOD LEFT HAND    Special Requests      BOTTLES DRAWN AEROBIC AND ANAEROBIC Blood Culture adequate volume   Culture  Setup Time      GRAM POSITIVE COCCI IN BOTH AEROBIC AND ANAEROBIC BOTTLES CRITICAL RESULT CALLED TO, READ BACK BY AND VERIFIED WITH: Salli Real, AT 0724 06/29/18 BY Rush Landmark Performed at [facility], [place] [address] [place], [place] [address]    Culture GRAM POSITIVE COCCI    Report Status PENDING   Blood Culture ID Panel (Reflexed)     Status: Abnormal   Collection Time: 06/28/18  3:35 PM  Result Value Ref Range   Enterococcus species NOT DETECTED NOT DETECTED   Listeria monocytogenes NOT DETECTED NOT DETECTED   Staphylococcus species DETECTED (A) NOT DETECTED    Comment: CRITICAL RESULT CALLED TO, READ BACK BY AND VERIFIED WITH: G. ABBOTT PHARMD, AT 0724 06/29/18 BY D. VANHOOK    Staphylococcus aureus DETECTED (A) NOT DETECTED    Comment: Methicillin (oxacillin) susceptible Staphylococcus aureus (MSSA). Preferred therapy is anti staphylococcal beta lactam antibiotic (Cefazolin or Nafcillin), unless clinically contraindicated. CRITICAL RESULT CALLED TO, READ BACK BY AND VERIFIED WITH: [place] Brick PHARMD, AT 0724 06/29/18 BY D. VANHOOK    Methicillin resistance NOT DETECTED NOT DETECTED   Streptococcus species NOT DETECTED NOT DETECTED   Streptococcus agalactiae NOT DETECTED NOT DETECTED   Streptococcus pneumoniae NOT DETECTED NOT DETECTED   Streptococcus pyogenes NOT DETECTED NOT DETECTED   Acinetobacter baumannii NOT DETECTED NOT DETECTED   Enterobacteriaceae species NOT DETECTED NOT DETECTED   Enterobacter cloacae complex NOT DETECTED NOT DETECTED   Escherichia coli NOT DETECTED NOT DETECTED   Klebsiella oxytoca NOT DETECTED NOT DETECTED   Klebsiella  pneumoniae NOT DETECTED NOT DETECTED   Proteus species NOT DETECTED NOT DETECTED    Serratia marcescens NOT DETECTED NOT DETECTED   Haemophilus influenzae NOT DETECTED NOT DETECTED   Neisseria meningitidis NOT DETECTED NOT DETECTED   Pseudomonas aeruginosa NOT DETECTED NOT DETECTED   Candida albicans NOT DETECTED NOT DETECTED   Candida glabrata NOT DETECTED NOT DETECTED   Candida krusei NOT DETECTED NOT DETECTED   Candida parapsilosis NOT DETECTED NOT DETECTED   Candida tropicalis NOT DETECTED NOT DETECTED    Comment: Performed at [facility], [place] [address], [place], [place] [address]  Aerobic/Anaerobic Culture (surgical/deep wound)     Status: None (Preliminary result)   Collection Time: 06/28/18  5:01 PM  Result Value Ref Range   Specimen Description ABSCESS LUMBAR    Special Requests NONE    Gram Stain      ABUNDANT WBC PRESENT, PREDOMINANTLY PMN ABUNDANT GRAM POSITIVE COCCI IN PAIRS IN CLUSTERS    Culture      MODERATE STAPHYLOCOCCUS AUREUS SUSCEPTIBILITIES TO FOLLOW Performed at [facility], [place] [address] [place], [place] [address]    Report Status PENDING   CBC     Status: Abnormal   Collection Time: 06/29/18  3:10 AM  Result Value Ref Range   WBC 12.6 (H) 4.0 - 10.5 K/uL   RBC 4.82 4.22 - 5.81 MIL/uL   Hemoglobin 13.5 13.0 - 17.0 g/dL   HCT 42.6 39.0 - 52.0 %   MCV 88.4 78.0 - 100.0 fL   MCH 28.0 26.0 - 34.0 pg   MCHC 31.7 30.0 - 36.0 g/dL   RDW 15.3 11.5 - 15.5 %   Platelets 144 (L) 150 - 400 K/uL    Comment: Performed at [facility], [place] [address] [place], [place] [address]  Comprehensive metabolic panel     Status: Abnormal   Collection Time: 06/29/18  3:10 AM  Result Value Ref Range   Sodium 141 135 - 145 mmol/L   Potassium 3.6 3.5 - 5.1 mmol/L   Chloride 107 98 - 111 mmol/L   CO2 24 22 - 32 mmol/L   Glucose, Bld 187 (H) 70 - 99 mg/dL   BUN 42 (H) 6 - 20 mg/dL   Creatinine, Ser 2.12 (H) 0.61 - 1.24 mg/dL   Calcium 8.6 (L) 8.9 - 10.3 mg/dL   Total Protein 6.7 6.5 - 8.1 g/dL   Albumin 2.6 (L) 3.5 - 5.0 g/dL   AST  59 (H) 15 - 41 U/L   ALT 28 0 - 44 U/L   Alkaline Phosphatase 99 38 - 126 U/L   Total Bilirubin 1.6 (H) 0.3 - 1.2 mg/dL   GFR calc non Af Amer 32 (L) >60 mL/min   GFR calc Af Amer 37 (L) >60 mL/min    Comment: (NOTE) The eGFR has been calculated using the CKD EPI equation. This calculation has not been validated in all clinical situations. eGFR's persistently <60 mL/min signify possible Chronic Kidney Disease.    Anion gap 10 5 - 15    Comment: Performed at [facility] [address], [place], [place] [address]  Uric acid     Status: None   Collection Time: 06/29/18  3:10 AM  Result Value Ref Range   Uric Acid, Serum 6.6 3.7 - 8.6 mg/dL    Comment: Performed at [facility] [address], [place], [place] [address]  Procalcitonin     Status: None   Collection  Time: 06/29/18  3:10 AM  Result Value Ref Range   Procalcitonin 3.98 ng/mL    Comment:        Interpretation: PCT > 2 ng/mL: Systemic infection (sepsis) is likely, unless other causes are known. (NOTE)       Sepsis PCT Algorithm           Lower Respiratory Tract                                      Infection PCT Algorithm    ----------------------------     ----------------------------         PCT < 0.25 ng/mL                PCT < 0.10 ng/mL         Strongly encourage             Strongly discourage   discontinuation of antibiotics    initiation of antibiotics    ----------------------------     -----------------------------       PCT 0.25 - 0.50 ng/mL            PCT 0.10 - 0.25 ng/mL               OR       >80% decrease in PCT            Discourage initiation of                                            antibiotics      Encourage discontinuation           of antibiotics    ----------------------------     -----------------------------         PCT >= 0.50 ng/mL              PCT 0.26 - 0.50 ng/mL               AND       <80% decrease in PCT              Encourage initiation of                                              antibiotics       Encourage continuation           of antibiotics    ----------------------------     -----------------------------        PCT >= 0.50 ng/mL                  PCT > 0.50 ng/mL               AND         increase in PCT                  Strongly encourage                                      initiation of antibiotics    Strongly encourage escalation  of antibiotics                                     -----------------------------                                           PCT <= 0.25 ng/mL                                                 OR                                        > 80% decrease in PCT                                     Discontinue / Do not initiate                                             antibiotics Performed at [facility], [place] [address], [place], [place] [address]   Type and screen Order type and screen if day of surgery is less than 15 days from draw of preadmission visit or order morning of surgery if day of surgery is greater than 6 days from preadmission visit.     Status: None   Collection Time: 06/29/18  2:58 PM  Result Value Ref Range   ABO/RH(D) A POS    Antibody Screen NEG    Sample Expiration      07/02/2018 Performed at [facility], [place] [address], [place], [place] [address]     Dg Chest 2 View  Result Date: 06/28/2018 CLINICAL DATA:  [place] yesterday.  Chest pain. EXAM: CHEST - 2 VIEW COMPARISON:  12/29/2013 FINDINGS: The cardiac silhouette, mediastinal and hilar contours are within normal limits and stable. The lungs are clear. The bony thorax is grossly intact. Stable advanced right-sided glenohumeral joint degenerative changes. IMPRESSION: No acute cardiopulmonary findings. Electronically Signed   By: Marijo Sanes M.D.   On: 06/28/2018 17:30   Dg Cervical Spine With Flex & Extend  Result Date: 06/28/2018 CLINICAL DATA:  Fall yesterday with neck pain. EXAM: CERVICAL SPINE COMPLETE WITH  FLEXION AND EXTENSION VIEWS COMPARISON:  None. FINDINGS: Note that the cervical spine can only be visualized down to the C6 level. Vertebral body alignment and heights are normal. There is mild to moderate spondylosis of the cervical spine. There is mild disc space narrowing at the C3-4, C4-5 and C5-6 levels. Prevertebral soft tissues are normal. No visualized instability on flexion and extension. Atlantoaxial articulation is unremarkable. No acute fracture or subluxation. IMPRESSION: No acute findings. Note that the cervical spine is only visualized down to the C6 level. Mild spondylosis throughout the cervical spinal multilevel disc disease as described. Electronically Signed   By: Marin Olp M.D.   On: 06/28/2018 17:31   Dg Wrist 2 Views Left  Result  Date: 06/29/2018 CLINICAL DATA:  Wrist pain.  Initial encounter. EXAM: LEFT WRIST - 2 VIEW COMPARISON:  None. FINDINGS: Marked soft tissue swelling is present over the dorsum of the wrist. This appears to be worse on the ulnar side. IMPRESSION: Extensive soft tissue swelling over the dorsum of the wrist without underlying fracture. Electronically Signed   By: [place] Morelle M.D.   On: 06/29/2018 15:56   Mr Cervical Spine Wo Contrast  Result Date: 06/29/2018 CLINICAL DATA:  Neck pain, LEFT biceps weakness. History of spinal cord injury. EXAM: MRI CERVICAL SPINE WITHOUT CONTRAST TECHNIQUE: Multiplanar, multisequence MR imaging of the cervical spine was performed. No intravenous contrast was administered. COMPARISON:  Cervical spine radiograph June 28, 2018 and MRI thoracic spine June 28, 2018 FINDINGS: Mild motion degraded examination. ALIGNMENT: Straightened cervical lordosis.  No malalignment. VERTEBRAE/DISCS: Vertebral bodies are intact. Moderate C3-4 and C4-5 disc height loss, moderate to severe at C5-6 with proportional subacute on chronic discogenic endplate changes. Diffuse mild disc desiccation. No abnormal or acute bone marrow  signal. Congenital canal narrowing on the basis of foreshortened pedicles. CORD:1 cm segment myelomalacia C5-6, faint subcentimeter myelomalacia C4-5. No cord edema or syrinx. POSTERIOR FOSSA, VERTEBRAL ARTERIES, PARASPINAL TISSUES: No MR findings of ligamentous injury. Vertebral artery flow voids present. Included posterior fossa and paraspinal soft tissues are normal. DISC LEVELS: C2-3: Annular bulging and uncovertebral hypertrophy. Mild facet arthropathy. No canal stenosis. Moderate bilateral neural foraminal narrowing. C3-4: Moderate broad-based disc bulge, uncovertebral hypertrophy mild facet arthropathy. Moderate canal stenosis. Severe bilateral neural foraminal narrowing. C4-5: Moderate broad-based disc bulge, uncovertebral hypertrophy and mild facet arthropathy. Moderate canal stenosis. Severe bilateral neural foraminal narrowing. C5-6: Moderate broad-based disc bulge, uncovertebral hypertrophy and mild facet arthropathy. Severe canal stenosis, AP dimension of the canal is 4 mm. Severe bilateral neural foraminal narrowing. C6-7: Small broad-based disc bulge, uncovertebral hypertrophy and mild facet arthropathy. Moderate canal stenosis. Moderate RIGHT and severe LEFT neural foraminal narrowing. C7-T1: No disc bulge, canal stenosis nor neural foraminal narrowing. Mild LEFT facet arthropathy. IMPRESSION: 1. Motion degraded examination. 2. Short segment myelomalacia at C4-5 and C5-6.  No syrinx. 3. Degenerative change of the cervical spine superimposed on congenital canal narrowing. Severe canal stenosis C5-6; moderate canal stenosis C3-4, C4-5 and C6-7. 4. Neural foraminal narrowing C2-3 through C6-7: Severe from C3-4 through C6-7. Electronically Signed   By: Elon Alas M.D.   On: 06/29/2018 01:03   Mr Thoracic Spine Wo Contrast  Result Date: 06/28/2018 CLINICAL DATA:  Worsening low back pain radiating to bilateral lower extremity with numbness for several months. RIGHT leg weakness today, bilateral  lower extremity paralysis for 2 days. Status post lumbar spine surgery February 2019. EXAM: MRI THORACIC AND LUMBAR SPINE WITHOUT CONTRAST TECHNIQUE: Multiplanar and multiecho pulse sequences of the thoracic and lumbar spine were obtained without intravenous contrast. COMPARISON:  MRI lumbar spine July 29, 2017 FINDINGS: MRI THORACIC SPINE FINDINGS ALIGNMENT: Maintenance of the thoracic kyphosis. No malalignment. VERTEBRAE/DISCS: Vertebral bodies are intact. Severe disc height loss T3-4 through T 910 with diffuse disc desiccation and moderate subacute discogenic endplate changes. No acute or abnormal bone marrow signal. Borderline congenital canal narrowing on the basis of foreshortened pedicles. CORD: Subcentimeter holo cord T2 bright signal at T7-8. 12 mm segment T2 bright signal spinal cord at T9-10, potential myelomalacia. No syrinx. PREVERTEBRAL AND PARASPINAL SOFT TISSUES: Symmetric moderate to severe paraspinal muscle atrophy. DISC LEVELS: T1-2: No disc bulge. Mild facet arthropathy. No canal stenosis. Mild neural foraminal narrowing. T2-3: Small broad-based disc  bulge with superimposed 4 mm LEFT central disc protrusion. Mild facet arthropathy. Moderate canal stenosis. Severe RIGHT and moderate LEFT neural foraminal narrowing. T3-4, T4-5: No disc bulge, canal stenosis nor neural foraminal narrowing. T5-6: Bilateral subarticular disc protrusions measuring to 5 mm on the LEFT. Mild facet arthropathy. Mild canal stenosis. Moderate to severe RIGHT and moderate LEFT neural foraminal narrowing. T6-7: Small broad-based disc bulge minimal facet arthropathy. No canal stenosis. Moderate RIGHT neural foraminal narrowing. T7-8: 3 mm broad-based disc bulge. Mild facet arthropathy. Mild canal stenosis. Mild neural foraminal narrowing. T8-9: Small RIGHT subarticular disc protrusion. No canal stenosis. Moderate RIGHT and mild LEFT neural foraminal narrowing. T9-10: 5 mm broad-based disc bulge, moderate facet arthropathy.  Moderate canal stenosis. Severe bilateral neural foraminal narrowing. T10-11: 3 mm broad-based disc bulge. Severe facet arthropathy. Moderate canal stenosis. Severe bilateral neural foraminal narrowing. T11-12: Annular bulging. Mild facet arthropathy. No canal stenosis or neural foraminal narrowing. T12-L1: No disc bulge. Moderate facet arthropathy without canal stenosis. Mild LEFT neural foraminal narrowing. MRI LUMBAR SPINE FINDINGS-large body habitus results in decreased signal to noise ratio, mild motion degraded examination. SEGMENTATION: For the purposes of this report, the last well-formed intervertebral disc is reported as L5-S1. ALIGNMENT: Maintained lumbar lordosis. Similar minimal grade 1 L5-S1 anterolisthesis. Grade 1 L2-3 retrolisthesis. VERTEBRAE:Vertebral bodies are intact. Intervertebral discs demonstrate normal morphology, mild desiccation and superimposed mild disc edema lower lumbar discs. Moderate subacute discogenic endplate changes Q9-V6. Multilevel mild subacute to chronic discogenic endplate changes. No suspicious bone marrow signal. CONUS MEDULLARIS AND CAUDA EQUINA: Conus medullaris terminates at L1-2 and demonstrates normal signal. Cauda equina not discretely identified. PARASPINAL AND OTHER SOFT TISSUES: Severe paraspinal muscle denervation/atrophy. Asymmetric LEFT distal iliopsoas muscle atrophy. 2.9 x 4.3 x 6.1 cm central paraspinal fluid collection L4 through S1 along surgical approach. DISC LEVELS: L1-2: Small broad-based disc bulge. Moderate RIGHT and moderate to severe LEFT facet arthropathy. No canal stenosis. Mild LEFT neural foraminal narrowing. L2-3: Retrolisthesis. Small broad-based disc bulge. Status post LEFT hemilaminectomy. Moderate facet arthropathy without canal stenosis. Moderate bilateral neural foraminal narrowing. L3-4: No disc bulge. LEFT and possible RIGHT laminectomies. Severe facet arthropathy. No canal stenosis. Mild neural foraminal narrowing. L4-5: LEFT and  possibly RIGHT laminectomies. Severe facet arthropathy with RIGHT facet effusion contiguous with paraspinal fluid collection. RIGHT facet is widened to 3 mm. 5 mm central disc protrusion versus extrusion. Moderate to severe canal stenosis predominately due to fluid collection suspected though, this may be overestimated. Severe bilateral neural foraminal narrowing. L5-S1: Anterolisthesis. Annular bulging. Severe facet arthropathy. Moderate canal stenosis. Moderate to severe bilateral neural foraminal narrowing. IMPRESSION: MR THORACIC SPINE IMPRESSION 1. No fracture, malalignment or acute osseous process. 2. Subcentimeter cord edema T7-8, less likely myelomalacia. 12 mm segment suspected myelomalacia T9-10. No syrinx. 3. Degenerative change of the lumbar spine resulting in moderate canal stenosis T9-10 and T10-11. Multilevel mild canal stenosis. 4. Multilevel neural foraminal narrowing: Severe at T2-3, T9-10 and T10-11. 5. Moderate to severe paraspinal muscle atrophy. MR LUMBAR SPINE IMPRESSION 1. Habitus and motion degraded examination. Similar grade 1 L2-3 retrolisthesis and grade 1 L4-5 anterolisthesis. No fracture. 2. L2-3 through L4-5 laminectomies. 2.9 x 4.3 x 6.1 cm fluid collection along surgical approach contiguous with RIGHT L4-5 facet effusion, septic arthropathy possible. Consider sampling. 3. Moderate to severe suspected canal stenosis L4-5 impart due to fluid collection, potentially overestimated. Moderate canal stenosis L5-S1. 4. Neural foraminal narrowing all lumbar levels: Severe at L4-5. 5. Severe paraspinal muscle atrophy, early LEFT psoas muscle atrophy. Electronically Signed  By: Elon Alas M.D.   On: 06/28/2018 03:10   Mr Lumbar Spine Wo Contrast  Result Date: 06/28/2018 CLINICAL DATA:  Worsening low back pain radiating to bilateral lower extremity with numbness for several months. RIGHT leg weakness today, bilateral lower extremity paralysis for 2 days. Status post lumbar spine  surgery February 2019. EXAM: MRI THORACIC AND LUMBAR SPINE WITHOUT CONTRAST TECHNIQUE: Multiplanar and multiecho pulse sequences of the thoracic and lumbar spine were obtained without intravenous contrast. COMPARISON:  MRI lumbar spine July 29, 2017 FINDINGS: MRI THORACIC SPINE FINDINGS ALIGNMENT: Maintenance of the thoracic kyphosis. No malalignment. VERTEBRAE/DISCS: Vertebral bodies are intact. Severe disc height loss T3-4 through T 910 with diffuse disc desiccation and moderate subacute discogenic endplate changes. No acute or abnormal bone marrow signal. Borderline congenital canal narrowing on the basis of foreshortened pedicles. CORD: Subcentimeter holo cord T2 bright signal at T7-8. 12 mm segment T2 bright signal spinal cord at T9-10, potential myelomalacia. No syrinx. PREVERTEBRAL AND PARASPINAL SOFT TISSUES: Symmetric moderate to severe paraspinal muscle atrophy. DISC LEVELS: T1-2: No disc bulge. Mild facet arthropathy. No canal stenosis. Mild neural foraminal narrowing. T2-3: Small broad-based disc bulge with superimposed 4 mm LEFT central disc protrusion. Mild facet arthropathy. Moderate canal stenosis. Severe RIGHT and moderate LEFT neural foraminal narrowing. T3-4, T4-5: No disc bulge, canal stenosis nor neural foraminal narrowing. T5-6: Bilateral subarticular disc protrusions measuring to 5 mm on the LEFT. Mild facet arthropathy. Mild canal stenosis. Moderate to severe RIGHT and moderate LEFT neural foraminal narrowing. T6-7: Small broad-based disc bulge minimal facet arthropathy. No canal stenosis. Moderate RIGHT neural foraminal narrowing. T7-8: 3 mm broad-based disc bulge. Mild facet arthropathy. Mild canal stenosis. Mild neural foraminal narrowing. T8-9: Small RIGHT subarticular disc protrusion. No canal stenosis. Moderate RIGHT and mild LEFT neural foraminal narrowing. T9-10: 5 mm broad-based disc bulge, moderate facet arthropathy. Moderate canal stenosis. Severe bilateral neural foraminal  narrowing. T10-11: 3 mm broad-based disc bulge. Severe facet arthropathy. Moderate canal stenosis. Severe bilateral neural foraminal narrowing. T11-12: Annular bulging. Mild facet arthropathy. No canal stenosis or neural foraminal narrowing. T12-L1: No disc bulge. Moderate facet arthropathy without canal stenosis. Mild LEFT neural foraminal narrowing. MRI LUMBAR SPINE FINDINGS-large body habitus results in decreased signal to noise ratio, mild motion degraded examination. SEGMENTATION: For the purposes of this report, the last well-formed intervertebral disc is reported as L5-S1. ALIGNMENT: Maintained lumbar lordosis. Similar minimal grade 1 L5-S1 anterolisthesis. Grade 1 L2-3 retrolisthesis. VERTEBRAE:Vertebral bodies are intact. Intervertebral discs demonstrate normal morphology, mild desiccation and superimposed mild disc edema lower lumbar discs. Moderate subacute discogenic endplate changes P9-J0. Multilevel mild subacute to chronic discogenic endplate changes. No suspicious bone marrow signal. CONUS MEDULLARIS AND CAUDA EQUINA: Conus medullaris terminates at L1-2 and demonstrates normal signal. Cauda equina not discretely identified. PARASPINAL AND OTHER SOFT TISSUES: Severe paraspinal muscle denervation/atrophy. Asymmetric LEFT distal iliopsoas muscle atrophy. 2.9 x 4.3 x 6.1 cm central paraspinal fluid collection L4 through S1 along surgical approach. DISC LEVELS: L1-2: Small broad-based disc bulge. Moderate RIGHT and moderate to severe LEFT facet arthropathy. No canal stenosis. Mild LEFT neural foraminal narrowing. L2-3: Retrolisthesis. Small broad-based disc bulge. Status post LEFT hemilaminectomy. Moderate facet arthropathy without canal stenosis. Moderate bilateral neural foraminal narrowing. L3-4: No disc bulge. LEFT and possible RIGHT laminectomies. Severe facet arthropathy. No canal stenosis. Mild neural foraminal narrowing. L4-5: LEFT and possibly RIGHT laminectomies. Severe facet arthropathy with  RIGHT facet effusion contiguous with paraspinal fluid collection. RIGHT facet is widened to 3 mm. 5 mm central  disc protrusion versus extrusion. Moderate to severe canal stenosis predominately due to fluid collection suspected though, this may be overestimated. Severe bilateral neural foraminal narrowing. L5-S1: Anterolisthesis. Annular bulging. Severe facet arthropathy. Moderate canal stenosis. Moderate to severe bilateral neural foraminal narrowing. IMPRESSION: MR THORACIC SPINE IMPRESSION 1. No fracture, malalignment or acute osseous process. 2. Subcentimeter cord edema T7-8, less likely myelomalacia. 12 mm segment suspected myelomalacia T9-10. No syrinx. 3. Degenerative change of the lumbar spine resulting in moderate canal stenosis T9-10 and T10-11. Multilevel mild canal stenosis. 4. Multilevel neural foraminal narrowing: Severe at T2-3, T9-10 and T10-11. 5. Moderate to severe paraspinal muscle atrophy. MR LUMBAR SPINE IMPRESSION 1. Habitus and motion degraded examination. Similar grade 1 L2-3 retrolisthesis and grade 1 L4-5 anterolisthesis. No fracture. 2. L2-3 through L4-5 laminectomies. 2.9 x 4.3 x 6.1 cm fluid collection along surgical approach contiguous with RIGHT L4-5 facet effusion, septic arthropathy possible. Consider sampling. 3. Moderate to severe suspected canal stenosis L4-5 impart due to fluid collection, potentially overestimated. Moderate canal stenosis L5-S1. 4. Neural foraminal narrowing all lumbar levels: Severe at L4-5. 5. Severe paraspinal muscle atrophy, early LEFT psoas muscle atrophy. Electronically Signed   By: Elon Alas M.D.   On: 06/28/2018 03:10   Ct Aspiration  Result Date: 06/28/2018 INDICATION: History of spinal surgery in February 2019, now with bilateral lower extremity paralysis for the past 2 days. Lumbar spine MRI demonstrated an indeterminate fluid collection posterior to the L4-L5 vertebral bodies. Request made for image guided fluid aspiration for Gram stain  and culture analysis. EXAM: CT GUIDANCE NEEDLE PLACEMENT COMPARISON:  Lumbar spine MRI - 06/28/2018 MEDICATIONS: The patient is currently admitted to the hospital and receiving intravenous antibiotics. The antibiotics were administered within an appropriate time frame prior to the initiation of the procedure. ANESTHESIA/SEDATION: Moderate (conscious) sedation was employed during this procedure. A total of Fentanyl 150 mcg was administered intravenously. Moderate Sedation Time: 15 minutes. The patient's level of consciousness and vital signs were monitored continuously by radiology nursing throughout the procedure under my direct supervision. CONTRAST:  None COMPLICATIONS: None immediate. PROCEDURE: Informed written consent was obtained from the patient after a discussion of the risks, benefits and alternatives to treatment. The patient was placed prone on the CT gantry and a pre procedural CT was performed re-demonstrating the known abscess/fluid collection within the soft tissues posterior to the L4-L5 vertebral body with dominant air in fluid containing collection measuring approximately 5.9 x 2.7 cm (image 37, series 2). The procedure was planned. A timeout was performed prior to the initiation of the procedure. The skin overlying the midline of the lower back was prepped and draped in the usual sterile fashion. The overlying soft tissues were anesthetized with 1% lidocaine with epinephrine. Appropriate trajectory was planned with the use of a 22 gauge spinal needle. An 18 gauge trocar needle was advanced into the abscess/fluid collection. Appropriate positioning was confirmed with a limited CT scan. Approximately 7 cc of bloody fluid was aspirated as the needle was slowly retracted. All aspirated fluid was capped and sent to the laboratory for analysis. A dressing was placed. The patient tolerated the procedure well without immediate post procedural complication. IMPRESSION: Successful CT guided aspiration of  approximately 7 cc of bloody fluid from dominant fluid collection posterior to the L4 and L5 vertebral bodies. Samples were sent to the laboratory as requested by the ordering clinical team. Electronically Signed   By: Sandi Mariscal M.D.   On: 06/28/2018 18:12   Dg Shoulder Left  Result Date: 06/28/2018 CLINICAL DATA:  Fall yesterday with left shoulder pain. EXAM: LEFT SHOULDER - 2+ VIEW COMPARISON:  None. FINDINGS: Mild degenerate change of the [facility] joint and glenohumeral joints. No acute fracture or dislocation. IMPRESSION: No acute findings. Electronically Signed   By: Marin Olp M.D.   On: 06/28/2018 17:34   Dg Knee Complete 4 Views Left  Result Date: 06/28/2018 CLINICAL DATA:  Fall yesterday with left knee pain. EXAM: LEFT KNEE - COMPLETE 4+ VIEW COMPARISON:  None. FINDINGS: Moderate degenerative change of the lateral compartment with moderate to severe degenerative changes of the medial compartment and patellofemoral joints. No acute fracture or dislocation. Joint effusion is present. IMPRESSION: No acute fracture. Moderate to severe osteoarthritic change.  Joint effusion. Electronically Signed   By: Marin Olp M.D.   On: 06/28/2018 17:33    Pertinent items noted in HPI and remainder of comprehensive ROS otherwise negative. Blood pressure 125/82, pulse 77, temperature 97.7 F (36.5 C), temperature source Oral, resp. rate 18, height '5\' 10"'  (1.778 m), weight (!) 147.8 kg, SpO2 97 %. The patient is an overweight middle-aged male who does not appear to be particularly uncomfortable at present.  In general he is awake and alert.  He is oriented and appropriate.  His cranial nerve function is normal bilateral.  Motor examination reveals weakness of his left deltoid muscle group grading out at 4-/5.  He has weakness of his left biceps muscle group grading out at 4-/5.  He has weakness of his left wrist extensors grading at 4/5.  His weakness of his triceps grading out at 4-/5.  His weakness of his  left grips and intrinsics grading out of 4/5.  Right upper extremity strength is normal or very near normal.  Lower extremity strength reveals good hip flexion strength bilaterally.  The patient has sepsis strength bilaterally with a little bit more weakness on the left side.  He has absent dorsiflexion bilaterally.  He has diminished plantar flexion grading out at 4/5 on the left and 4+/5 on the right.  Lower extremity reflexes are absent.  Upper extremity reflexes are equivocal.  No evidence of long track signs.  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  I agree with the plan of returning to the operating room for irrigation and debridement this afternoon.  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  I would like the patient to recover from his lumbar infectious process prior to consider any type of surgical intervention but I do think the patient will likely require 3 level anterior cervical decompression and fusion for treatment of his severe stenosis.  With regard to his thoracic spine this is similar to his cervical spine in that I would like the patient to recover from his infectious process first and then consider possible decompressive surgery probably just at the T9-T10 level.  Cooper Render Danicia Terhaar 06/29/2018, 5:02 PM

## 2018-06-29 NOTE — Progress Notes (Addendum)
Pharmacy Antibiotic Note  Scot Junrnest Starkes is a 62 y.o. male admitted on 06/27/2018 with B LE weakness s/p lumbar fluid aspiration .  Pharmacy has been consulted for Vancomycin and Zosyn dosing.  Plan: Vancomycin 2 g IV now, then 1 g IV q12h Cefepime 2 g IV q8h  Height: 5\' 10"  (177.8 cm) Weight: (!) 325 lb 13.4 oz (147.8 kg) IBW/kg (Calculated) : 73  Temp (24hrs), Avg:98.6 F (37 C), Min:98 F (36.7 C), Max:99.3 F (37.4 C)  Recent Labs  Lab 06/28/18 0002  WBC 13.5*  CREATININE 1.77*    Estimated Creatinine Clearance: 64.6 mL/min (A) (by C-G formula based on SCr of 1.77 mg/dL (H)).    Allergies  Allergen Reactions  . Unasyn [Ampicillin-Sulbactam Sodium] Hives, Itching, Swelling and Rash    Eddie Candlebbott, Krystalle Pilkington Vernon 06/29/2018 12:07 AM

## 2018-06-29 NOTE — Progress Notes (Signed)
D; notified Dr. Maple HudsonMoser to go to floor  Alert, o2 sat is 96% with room air, respiratory tx will bring CPAP to the room. Pt is not using CPAP at home.  , ok to dc to floor per Dr. Maple HudsonMoser.

## 2018-06-29 NOTE — Anesthesia Preprocedure Evaluation (Signed)
Anesthesia Evaluation  Patient identified by MRN, date of birth, ID band Patient awake    Airway Mallampati: II  TM Distance: >3 FB     Dental   Pulmonary former smoker,    breath sounds clear to auscultation       Cardiovascular hypertension,  Rhythm:Regular Rate:Normal     Neuro/Psych    GI/Hepatic negative GI ROS, Neg liver ROS,   Endo/Other    Renal/GU Renal disease     Musculoskeletal  (+) Arthritis ,   Abdominal   Peds  Hematology  (+) anemia ,   Anesthesia Other Findings   Reproductive/Obstetrics                             Anesthesia Physical Anesthesia Plan  ASA: III  Anesthesia Plan: General   Post-op Pain Management:    Induction: Intravenous  PONV Risk Score and Plan: Ondansetron, Dexamethasone and Midazolam  Airway Management Planned: Oral ETT  Additional Equipment:   Intra-op Plan:   Post-operative Plan: Possible Post-op intubation/ventilation  Informed Consent: I have reviewed the patients History and Physical, chart, labs and discussed the procedure including the risks, benefits and alternatives for the proposed anesthesia with the patient or authorized representative who has indicated his/her understanding and acceptance.   Dental advisory given  Plan Discussed with: CRNA and Anesthesiologist  Anesthesia Plan Comments:         Anesthesia Quick Evaluation

## 2018-06-29 NOTE — Progress Notes (Addendum)
Patient ID: [name] [name], male   DOB: [birth year], 62 y.o.   MRN: [id number] Blood cultures are positive, lumbar fluid collection aspirate is culture positive for MSSA. Urine negative thus far. I will make patient NPO and plan to perform Incision, drainage and debridement of the posterior lumbar abscess this evening. Findings of cervical and thoracic myelopathic cord changes discussed with Dr. Lelon PerlaHenry Poole and he has kindly agreed to help me with this patient cervical and thoracic condition.

## 2018-06-29 NOTE — Progress Notes (Signed)
Pharmacy Antibiotic Note  [name] [name] is a 62 y.o. male admitted on 06/27/2018 with MSSA bacteremia and lumbar abscess. Pharmacy has been consulted for cefazolin dosing. Noted allergy to Unasyn but has tolerated cefazolin in the past. WBC 12.6, currently AF. Scr 2.12 (BL ~1), estimated CrCl ~54 mL/min.  Plan: D/c vanc and cefepime Start cefazolin 2g IV q8h F/u clinical status, repeat cx, ECHO, renal function Pharmacy will sign off and follow peripherally for dose adjustments  Height: 5\' 10"  (177.8 cm) Weight: (!) 325 lb 13.4 oz (147.8 kg) IBW/kg (Calculated) : 73  Temp (24hrs), Avg:98.4 F (36.9 C), Min:97.5 F (36.4 C), Max:99.3 F (37.4 C)  Recent Labs  Lab 06/28/18 0002 06/29/18 0310  WBC 13.5* 12.6*  CREATININE 1.77* 2.12*    Estimated Creatinine Clearance: 53.9 mL/min (A) (by C-G formula based on SCr of 2.12 mg/dL (H)).    Allergies  Allergen Reactions  . Unasyn [Ampicillin-Sulbactam Sodium] Hives, Itching, Swelling and Rash   Antimicrobials this admission: Vanc 9/16 Cefepime 9/16 Cefazolin 9/16 >>  Microbiology results: 9/15 BCx: GPC (BCID MSSA) 9/15 lumbar abscess cx: SA  9/15 UCx: multiple species  Thank you for allowing pharmacy to be a part of this patient's care.  Roderic ScarceErin N. Zigmund Danieleja, PharmD PGY2 Infectious Diseases Pharmacy Resident Phone: [phone] 06/29/2018 10:43 AM

## 2018-06-29 NOTE — Progress Notes (Signed)
[facility]  [facility] will follow pt with ID team to support home IV ABX if needed at DC to home.  If patient discharges after hours, please call [phone].   Sedalia Mutaamela S Chandler 06/29/2018, 3:36 PM

## 2018-06-29 NOTE — Discharge Instructions (Addendum)
° ° °  No lifting greater than 10 lbs. Avoid frequent bending, stooping and twisting. Rehabilitation will work with you to improve lower leg strength, endurance and balance, work on transfers and on bladder incontinence intermittant catheterization And on a bowel regimen. Keep incision dry with VAC, VAC changes every 3 days until wound in the lumbar area is healed impervious dressing.    Keep left knee incisions dry for and apply bandaids. The sutures in the left knee may be removed 2 weeks following the surgery on 9/31/2019. Call if fever or chills or increased drainage. Go to ER if acutely short of breath or call for ambulance. Return for follow up in 2 weeks. May full weight bear on the surgical leg unless told otherwise.Take anticoagulation daily to prevent blood clots.

## 2018-06-29 NOTE — Consult Note (Signed)
Medical Consultation   [name] [name]  IBB:[id number]  DOB: November 07, 1955  DOA: 06/27/2018  PCP: Seward Carol, MD    Requesting physician: Dr Basil Dess  Reason for consultation: AKI , medical management   History of Present Illness: [name] [name] is an 62 y.o. male  with h/o Morbid obesity,nephrolithiasis, urosepsis in 2015,  previous lumbar laminectomy L4-5 and L5-S1 in the distance past, s/p redo multilevel lumbar central laminectomy for recurrent lumbar spinal stenosis 11/2107,severe bilateral knee and hip osteoarthritis but not considered to be a candidate for joint replacement due to his size presented on 9/15 with  worsening weakness in his legs with difficulty getting out of bed associated with lumbosacral pain unrelieved by ibuprofen and mobic that he took at home. He follows Dr. Marlou Sa for bilateral shoulder pain and weakness and has severe osteoarthritis of both shoulders. Dr. Marlou Sa is considering a shoulder replacement but is concerned due to his size and obvious dependency of his arms for assisting in transfers. Patient has noticed weakness in the left arm and numbness since the most recent fall last week.MRI of thoracic spine obtained on admission showed and lumbar spine most significant for intense cord T2 signal at T9-10, fluid collection at L4-5 level contiguous with right L4-5 facet suspicious for abscess. Due to new UE weakness , MRI C -spine was obtained on admission which revealed short segment myelomalacia at C4-5 and C5-6.  and degenerative changes superimposed on congenital canal narrowing with severe canal stenosis C5-6; moderate canal stenosis C3-4, C4-5 and C6-7.  He underwent IR assisted drainage of L2-L5 epidural bscess and is being followed by Infectious diseases for this as well as MSSA bacteremia, started on Ancef. On admission his labs showed elevated creatinine at 1.7 (baseline 0.8 to 1.3) which is now worsening with creatinine upto 2. IV fluids started  by primary team and  Hospitalist team consulted for further medical management.   Patient is awake alert oriented x3.  Wife is bedside today.  He states he was able to ambulate with a walker on Thursday.  He did have a mechanical fall when he was in his garage on Thursday due to slippery floor but states he kind of slid down and landed on his buttock.  He was able to ambulate after that but Friday morning he could not get out of bed due to severe pain and weakness in lower extremities.  He took 6 tablets of ibuprofen 200 mg that day.  He urinated once that day into a urinal, about 200 mL.  Saturday morning when he tried to get out of the bed, he could not bear weight and had another fall, again slid down to the floor per patient.  Wife and neighbors together got him back to bed and called 911.  He states he did not urinate all day Saturday.  He states he was sedated for MRI and wet himself while in the MRI Sunday early morning around 1 AM.  Yesterday afternoon indwelling Foley catheter was placed with a "bag full" of output.  He currently rates his pain at 2/10 mostly in the left shoulder.  Last bowel movement was on Friday   Review of Systems:  ROS As per HPI otherwise 10 point review of systems negative.     Past Medical History: Past Medical History:  Diagnosis Date  . Anemia   . Arthritis   . Chronic kidney disease    HX  acute kidney failure / acute pyelonephritis / hydronephrosis / severe sepsis per discharge summary 12/27/13  . History of kidney stones   . Hypertension   . Morbid obesity ([place])   . Obstructive sleep apnea    does not need c pap since 110 lb wt loss  . Osteoporosis   . Prurigo 2002  . Scars    ON ARMS FROM CHEMICAL EXPLOSION 1999  . Spinal stenosis     Past Surgical History: Past Surgical History:  Procedure Laterality Date  . BACK SURGERY  01/23/2010   lumbar  . CHOLECYSTECTOMY N/A 03/24/2016   Procedure: LAPAROSCOPIC CHOLECYSTECTOMY WITH INTRAOPERATIVE  CHOLANGIOGRAM;  Surgeon: Autumn Messing III, MD;  Location: [facility];  Service: General;  Laterality: N/A;  . CIRCUMCISION    . CYSTOSCOPY WITH RETROGRADE PYELOGRAM, URETEROSCOPY AND STENT PLACEMENT Left 01/20/2014   Procedure: CYSTOSCOPY WITH RETROGRADE PYELOGRAM, URETEROSCOPY AND STENT EXCHANGE;  Surgeon: Molli Hazard, MD;  Location: [facility];  Service: Urology;  Laterality: Left;  bugbee bladder fulguration  . CYSTOSCOPY WITH STENT PLACEMENT Left 12/28/2013   Procedure: CYSTOSCOPY WITH STENT PLACEMENT left retrograde pyleogram;  Surgeon: Molli Hazard, MD;  Location: [facility];  Service: Urology;  Laterality: Left;  . ESOPHAGOGASTRODUODENOSCOPY N/A 12/07/2012   Procedure: ESOPHAGOGASTRODUODENOSCOPY (EGD);  Surgeon: Madilyn Hook, DO;  Location: [facility];  Service: General;  Laterality: N/A;  . HOLMIUM LASER APPLICATION Left 03/17/4033   Procedure: HOLMIUM LASER APPLICATION;  Surgeon: Molli Hazard, MD;  Location: [facility];  Service: Urology;  Laterality: Left;  . LAPAROSCOPIC GASTRIC SLEEVE RESECTION N/A 12/07/2012   Procedure: LAPAROSCOPIC GASTRIC SLEEVE RESECTION;  Surgeon: Madilyn Hook, DO;  Location: [facility];  Service: General;  Laterality: N/A;  laparoscopic sleeve gastrectomy with EGD  . LUMBAR LAMINECTOMY N/A 11/17/2017   Procedure: L2-3 LAMINECTOMY AND REDO LAMINECTOMIES  L3-4, L4-5 AND L5-S1;  Surgeon: Jessy Oto, MD;  Location: [place];  Service: Orthopedics;  Laterality: N/A;     Allergies:   Allergies  Allergen Reactions  . Unasyn [Ampicillin-Sulbactam Sodium] Hives, Itching, Swelling and Rash     Social History:  reports that he quit smoking about 44 years ago. His smoking use included cigarettes. He has never used smokeless tobacco. He reports that he does not  use drugs.  He does drink beer occasionally but not on a regular basis   Family History: Patient reports history of diabetes in maternal side of the family   Physical Exam: Vitals:   06/28/18 1747 06/28/18  1929 06/29/18 0405 06/29/18 1157  BP: (!) 150/117 119/80 131/85 (!) 144/83  Pulse: 89 92 74 81  Resp: '19 18 18 16  ' Temp:  98.4 F (36.9 C) (!) 97.5 F (36.4 C) 98.3 F (36.8 C)  TempSrc:  Oral Oral Oral  SpO2: 98% 97% 99% 97%  Weight:      Height:        Constitutional: Morbidly obese while laying in bed, no acute distress.  Alert and awake, oriented x3.  Eyes: PERLA, EOMI, irises appear normal, anicteric sclera,  ENMT: external ears and nose appear normal, normal hearing  Lips appears normal, oropharynx mucosa, tongue, posterior pharynx appear normal  Neck: neck appears normal, no masses, normal ROM, no thyromegaly, no JVD  CVS: S1-S2 clear, no murmur rubs or gallops, no LE edema, normal pedal pulses  Respiratory: Distant breath sounds.  Clear to auscultation bilaterally, no wheezing, rales or rhonchi. Respiratory effort normal. No accessory muscle use.  Abdomen: Obese, soft nontender, nondistended, normal  bowel sounds Musculoskeletal: Decreased range of motion along left shoulder abduction, soft tissue swelling 2x 2 cm along the left wrist joint/dorsum of, tender with no surrounding redness. Neuro: Cranial nerves II-XII intact, good handgrip bilaterally, strength 2-3/5 in lower extremities, right greater than left (able to move along the bed spontaneously but not against gravity), strength 4/5 in upper extremities, sensations to crude touch intact.  Psych: judgement and insight appear normal, stable mood and affect, mental status Skin/Catheters: no rashes or ulcers noted.  Hyperpigmented chronic lesions along upper arms.  Data reviewed:  I have personally reviewed following labs and imaging studies Labs:  CBC: Recent Labs  Lab 06/28/18 0002 06/29/18 0310  WBC 13.5* 12.6*  NEUTROABS 12.5*  --   HGB 14.2 13.5  HCT 44.5 42.6  MCV 89.4 88.4  PLT 137* 144*    Basic Metabolic Panel: Recent Labs  Lab 06/28/18 0002 06/29/18 0310  NA 141 141  K 4.0 3.6  CL 101 107  CO2 27  24  GLUCOSE 147* 187*  BUN 26* 42*  CREATININE 1.77* 2.12*  CALCIUM 9.0 8.6*   GFR Estimated Creatinine Clearance: 53.9 mL/min (A) (by C-G formula based on SCr of 2.12 mg/dL (H)). Liver Function Tests: Recent Labs  Lab 06/28/18 0002 06/29/18 0310  AST 47* 59*  ALT 21 28  ALKPHOS 93 99  BILITOT 1.7* 1.6*  PROT 6.9 6.7  ALBUMIN 3.0* 2.6*   No results for input(s): LIPASE, AMYLASE in the last 168 hours. No results for input(s): AMMONIA in the last 168 hours. Coagulation profile Recent Labs  Lab 06/28/18 1410  INR 1.05   Cardiac Enzymes: No results for input(s): CKTOTAL, CKMB, CKMBINDEX, TROPONINI in the last 168 hours. BNP: Invalid input(s): POCBNP CBG: No results for input(s): GLUCAP in the last 168 hours. D-Dimer No results for input(s): DDIMER in the last 72 hours. Hgb A1c No results for input(s): HGBA1C in the last 72 hours. Lipid Profile No results for input(s): CHOL, HDL, LDLCALC, TRIG, CHOLHDL, LDLDIRECT in the last 72 hours. Thyroid function studies No results for input(s): TSH, T4TOTAL, T3FREE, THYROIDAB in the last 72 hours.  Invalid input(s): FREET3 Anemia work up No results for input(s): VITAMINB12, FOLATE, FERRITIN, TIBC, IRON, RETICCTPCT in the last 72 hours. Urinalysis    Component Value Date/Time   COLORURINE AMBER (A) 06/28/2018 1157   APPEARANCEUR HAZY (A) 06/28/2018 1157   LABSPEC 1.025 06/28/2018 1157   PHURINE 5.0 06/28/2018 1157   GLUCOSEU NEGATIVE 06/28/2018 1157   HGBUR MODERATE (A) 06/28/2018 1157   BILIRUBINUR NEGATIVE 06/28/2018 1157   KETONESUR NEGATIVE 06/28/2018 1157   PROTEINUR 30 (A) 06/28/2018 1157   UROBILINOGEN 0.2 12/27/2013 2115   NITRITE NEGATIVE 06/28/2018 1157   LEUKOCYTESUR NEGATIVE 06/28/2018 1157     Microbiology Recent Results (from the past 240 hour(s))  Culture, Urine     Status: Abnormal   Collection Time: 06/28/18 11:59 AM  Result Value Ref Range Status   Specimen Description URINE, CATHETERIZED  Final     Special Requests   Final    Normal Performed at [facility], [address], [place],  [address]    Culture MULTIPLE SPECIES PRESENT, SUGGEST RECOLLECTION (A)  Final   Report Status 06/29/2018 FINAL  Final  Culture, blood (routine x 2)     Status: None (Preliminary result)   Collection Time: 06/28/18  3:30 PM  Result Value Ref Range Status   Specimen Description BLOOD LEFT ANTECUBITAL  Final   Special Requests  Final    BOTTLES DRAWN AEROBIC AND ANAEROBIC Blood Culture adequate volume   Culture  Setup Time   Final    GRAM POSITIVE COCCI IN BOTH AEROBIC AND ANAEROBIC BOTTLES CRITICAL RESULT CALLED TO, READ BACK BY AND VERIFIED WITH: Salli Real, AT 0724 06/29/18 BY Rush Landmark Performed at [facility], [place] [address] [place], [place] [address]    Culture PENDING  Incomplete   Report Status PENDING  Incomplete  Culture, blood (routine x 2)     Status: None (Preliminary result)   Collection Time: 06/28/18  3:35 PM  Result Value Ref Range Status   Specimen Description BLOOD LEFT HAND  Final   Special Requests   Final    BOTTLES DRAWN AEROBIC AND ANAEROBIC Blood Culture adequate volume   Culture  Setup Time   Final    GRAM POSITIVE COCCI IN BOTH AEROBIC AND ANAEROBIC BOTTLES CRITICAL RESULT CALLED TO, READ BACK BY AND VERIFIED WITH: Salli Real, AT 0724 06/29/18 BY Rush Landmark Performed at [facility], [place] [address], [place],  [address]    Culture GRAM POSITIVE COCCI  Final   Report Status PENDING  Incomplete  Blood Culture ID Panel (Reflexed)     Status: Abnormal   Collection Time: 06/28/18  3:35 PM  Result Value Ref Range Status   Enterococcus species NOT DETECTED NOT DETECTED Final   Listeria monocytogenes NOT DETECTED NOT DETECTED Final   Staphylococcus species DETECTED (A) NOT DETECTED Final    Comment: CRITICAL RESULT CALLED TO, READ BACK BY AND VERIFIED WITH: G. ABBOTT PHARMD, AT 0724 06/29/18 BY D. VANHOOK    Staphylococcus  aureus DETECTED (A) NOT DETECTED Final    Comment: Methicillin (oxacillin) susceptible Staphylococcus aureus (MSSA). Preferred therapy is anti staphylococcal beta lactam antibiotic (Cefazolin or Nafcillin), unless clinically contraindicated. CRITICAL RESULT CALLED TO, READ BACK BY AND VERIFIED WITH: G. ABBOTT PHARMD, AT 0724 06/29/18 BY D. VANHOOK    Methicillin resistance NOT DETECTED NOT DETECTED Final   Streptococcus species NOT DETECTED NOT DETECTED Final   Streptococcus agalactiae NOT DETECTED NOT DETECTED Final   Streptococcus pneumoniae NOT DETECTED NOT DETECTED Final   Streptococcus pyogenes NOT DETECTED NOT DETECTED Final   Acinetobacter baumannii NOT DETECTED NOT DETECTED Final   Enterobacteriaceae species NOT DETECTED NOT DETECTED Final   Enterobacter cloacae complex NOT DETECTED NOT DETECTED Final   Escherichia coli NOT DETECTED NOT DETECTED Final   Klebsiella oxytoca NOT DETECTED NOT DETECTED Final   Klebsiella pneumoniae NOT DETECTED NOT DETECTED Final   Proteus species NOT DETECTED NOT DETECTED Final   Serratia marcescens NOT DETECTED NOT DETECTED Final   Haemophilus influenzae NOT DETECTED NOT DETECTED Final   Neisseria meningitidis NOT DETECTED NOT DETECTED Final   Pseudomonas aeruginosa NOT DETECTED NOT DETECTED Final   Candida albicans NOT DETECTED NOT DETECTED Final   Candida glabrata NOT DETECTED NOT DETECTED Final   Candida krusei NOT DETECTED NOT DETECTED Final   Candida parapsilosis NOT DETECTED NOT DETECTED Final   Candida tropicalis NOT DETECTED NOT DETECTED Final    Comment: Performed at [facility], [place] [address], [place],  [address]  Aerobic/Anaerobic Culture (surgical/deep wound)     Status: None (Preliminary result)   Collection Time: 06/28/18  5:01 PM  Result Value Ref Range Status   Specimen Description ABSCESS LUMBAR  Final   Special Requests NONE  Final   Gram Stain   Final    ABUNDANT WBC PRESENT, PREDOMINANTLY PMN  ABUNDANT GRAM  POSITIVE COCCI IN PAIRS IN CLUSTERS    Culture   Final    MODERATE STAPHYLOCOCCUS AUREUS SUSCEPTIBILITIES TO FOLLOW Performed at [facility], [place] [address], [place], [place] [address]    Report Status PENDING  Incomplete       Inpatient Medications:   Scheduled Meds: . acetaminophen  1,000 mg Oral Q6H  . docusate sodium  100 mg Oral BID  . HYDROcodone-acetaminophen  1 tablet Oral Q6H   Continuous Infusions: . sodium chloride 125 mL/hr at 06/28/18 0831  . sodium chloride 125 mL/hr at 06/28/18 1737  .  ceFAZolin (ANCEF) IV 2 g (06/29/18 1202)  . methocarbamol (ROBAXIN) IV       Radiological Exams on Admission: Dg Chest 2 View  Result Date: 06/28/2018 CLINICAL DATA:  [place] yesterday.  Chest pain. EXAM: CHEST - 2 VIEW COMPARISON:  12/29/2013 FINDINGS: The cardiac silhouette, mediastinal and hilar contours are within normal limits and stable. The lungs are clear. The bony thorax is grossly intact. Stable advanced right-sided glenohumeral joint degenerative changes. IMPRESSION: No acute cardiopulmonary findings. Electronically Signed   By: Marijo Sanes M.D.   On: 06/28/2018 17:30   Dg Cervical Spine With Flex & Extend  Result Date: 06/28/2018 CLINICAL DATA:  Fall yesterday with neck pain. EXAM: CERVICAL SPINE COMPLETE WITH FLEXION AND EXTENSION VIEWS COMPARISON:  None. FINDINGS: Note that the cervical spine can only be visualized down to the C6 level. Vertebral body alignment and heights are normal. There is mild to moderate spondylosis of the cervical spine. There is mild disc space narrowing at the C3-4, C4-5 and C5-6 levels. Prevertebral soft tissues are normal. No visualized instability on flexion and extension. Atlantoaxial articulation is unremarkable. No acute fracture or subluxation. IMPRESSION: No acute findings. Note that the cervical spine is only visualized down to the C6 level. Mild spondylosis throughout the cervical spinal multilevel disc disease as described.  Electronically Signed   By: Marin Olp M.D.   On: 06/28/2018 17:31   Mr Cervical Spine Wo Contrast  Result Date: 06/29/2018 CLINICAL DATA:  Neck pain, LEFT biceps weakness. History of spinal cord injury. EXAM: MRI CERVICAL SPINE WITHOUT CONTRAST TECHNIQUE: Multiplanar, multisequence MR imaging of the cervical spine was performed. No intravenous contrast was administered. COMPARISON:  Cervical spine radiograph June 28, 2018 and MRI thoracic spine June 28, 2018 FINDINGS: Mild motion degraded examination. ALIGNMENT: Straightened cervical lordosis.  No malalignment. VERTEBRAE/DISCS: Vertebral bodies are intact. Moderate C3-4 and C4-5 disc height loss, moderate to severe at C5-6 with proportional subacute on chronic discogenic endplate changes. Diffuse mild disc desiccation. No abnormal or acute bone marrow signal. Congenital canal narrowing on the basis of foreshortened pedicles. CORD:1 cm segment myelomalacia C5-6, faint subcentimeter myelomalacia C4-5. No cord edema or syrinx. POSTERIOR FOSSA, VERTEBRAL ARTERIES, PARASPINAL TISSUES: No MR findings of ligamentous injury. Vertebral artery flow voids present. Included posterior fossa and paraspinal soft tissues are normal. DISC LEVELS: C2-3: Annular bulging and uncovertebral hypertrophy. Mild facet arthropathy. No canal stenosis. Moderate bilateral neural foraminal narrowing. C3-4: Moderate broad-based disc bulge, uncovertebral hypertrophy mild facet arthropathy. Moderate canal stenosis. Severe bilateral neural foraminal narrowing. C4-5: Moderate broad-based disc bulge, uncovertebral hypertrophy and mild facet arthropathy. Moderate canal stenosis. Severe bilateral neural foraminal narrowing. C5-6: Moderate broad-based disc bulge, uncovertebral hypertrophy and mild facet arthropathy. Severe canal stenosis, AP dimension of the canal is 4 mm. Severe bilateral neural foraminal narrowing. C6-7: Small broad-based disc bulge, uncovertebral hypertrophy and mild  facet arthropathy. Moderate canal stenosis.  Moderate RIGHT and severe LEFT neural foraminal narrowing. C7-T1: No disc bulge, canal stenosis nor neural foraminal narrowing. Mild LEFT facet arthropathy. IMPRESSION: 1. Motion degraded examination. 2. Short segment myelomalacia at C4-5 and C5-6.  No syrinx. 3. Degenerative change of the cervical spine superimposed on congenital canal narrowing. Severe canal stenosis C5-6; moderate canal stenosis C3-4, C4-5 and C6-7. 4. Neural foraminal narrowing C2-3 through C6-7: Severe from C3-4 through C6-7. Electronically Signed   By: Elon Alas M.D.   On: 06/29/2018 01:03   Mr Thoracic Spine Wo Contrast  Result Date: 06/28/2018 CLINICAL DATA:  Worsening low back pain radiating to bilateral lower extremity with numbness for several months. RIGHT leg weakness today, bilateral lower extremity paralysis for 2 days. Status post lumbar spine surgery February 2019. EXAM: MRI THORACIC AND LUMBAR SPINE WITHOUT CONTRAST TECHNIQUE: Multiplanar and multiecho pulse sequences of the thoracic and lumbar spine were obtained without intravenous contrast. COMPARISON:  MRI lumbar spine July 29, 2017 FINDINGS: MRI THORACIC SPINE FINDINGS ALIGNMENT: Maintenance of the thoracic kyphosis. No malalignment. VERTEBRAE/DISCS: Vertebral bodies are intact. Severe disc height loss T3-4 through T 910 with diffuse disc desiccation and moderate subacute discogenic endplate changes. No acute or abnormal bone marrow signal. Borderline congenital canal narrowing on the basis of foreshortened pedicles. CORD: Subcentimeter holo cord T2 bright signal at T7-8. 12 mm segment T2 bright signal spinal cord at T9-10, potential myelomalacia. No syrinx. PREVERTEBRAL AND PARASPINAL SOFT TISSUES: Symmetric moderate to severe paraspinal muscle atrophy. DISC LEVELS: T1-2: No disc bulge. Mild facet arthropathy. No canal stenosis. Mild neural foraminal narrowing. T2-3: Small broad-based disc bulge with superimposed 4 mm  LEFT central disc protrusion. Mild facet arthropathy. Moderate canal stenosis. Severe RIGHT and moderate LEFT neural foraminal narrowing. T3-4, T4-5: No disc bulge, canal stenosis nor neural foraminal narrowing. T5-6: Bilateral subarticular disc protrusions measuring to 5 mm on the LEFT. Mild facet arthropathy. Mild canal stenosis. Moderate to severe RIGHT and moderate LEFT neural foraminal narrowing. T6-7: Small broad-based disc bulge minimal facet arthropathy. No canal stenosis. Moderate RIGHT neural foraminal narrowing. T7-8: 3 mm broad-based disc bulge. Mild facet arthropathy. Mild canal stenosis. Mild neural foraminal narrowing. T8-9: Small RIGHT subarticular disc protrusion. No canal stenosis. Moderate RIGHT and mild LEFT neural foraminal narrowing. T9-10: 5 mm broad-based disc bulge, moderate facet arthropathy. Moderate canal stenosis. Severe bilateral neural foraminal narrowing. T10-11: 3 mm broad-based disc bulge. Severe facet arthropathy. Moderate canal stenosis. Severe bilateral neural foraminal narrowing. T11-12: Annular bulging. Mild facet arthropathy. No canal stenosis or neural foraminal narrowing. T12-L1: No disc bulge. Moderate facet arthropathy without canal stenosis. Mild LEFT neural foraminal narrowing. MRI LUMBAR SPINE FINDINGS-large body habitus results in decreased signal to noise ratio, mild motion degraded examination. SEGMENTATION: For the purposes of this report, the last well-formed intervertebral disc is reported as L5-S1. ALIGNMENT: Maintained lumbar lordosis. Similar minimal grade 1 L5-S1 anterolisthesis. Grade 1 L2-3 retrolisthesis. VERTEBRAE:Vertebral bodies are intact. Intervertebral discs demonstrate normal morphology, mild desiccation and superimposed mild disc edema lower lumbar discs. Moderate subacute discogenic endplate changes F6-O1. Multilevel mild subacute to chronic discogenic endplate changes. No suspicious bone marrow signal. CONUS MEDULLARIS AND CAUDA EQUINA: Conus  medullaris terminates at L1-2 and demonstrates normal signal. Cauda equina not discretely identified. PARASPINAL AND OTHER SOFT TISSUES: Severe paraspinal muscle denervation/atrophy. Asymmetric LEFT distal iliopsoas muscle atrophy. 2.9 x 4.3 x 6.1 cm central paraspinal fluid collection L4 through S1 along surgical approach. DISC LEVELS: L1-2: Small broad-based disc bulge. Moderate RIGHT and moderate to severe LEFT facet arthropathy.  No canal stenosis. Mild LEFT neural foraminal narrowing. L2-3: Retrolisthesis. Small broad-based disc bulge. Status post LEFT hemilaminectomy. Moderate facet arthropathy without canal stenosis. Moderate bilateral neural foraminal narrowing. L3-4: No disc bulge. LEFT and possible RIGHT laminectomies. Severe facet arthropathy. No canal stenosis. Mild neural foraminal narrowing. L4-5: LEFT and possibly RIGHT laminectomies. Severe facet arthropathy with RIGHT facet effusion contiguous with paraspinal fluid collection. RIGHT facet is widened to 3 mm. 5 mm central disc protrusion versus extrusion. Moderate to severe canal stenosis predominately due to fluid collection suspected though, this may be overestimated. Severe bilateral neural foraminal narrowing. L5-S1: Anterolisthesis. Annular bulging. Severe facet arthropathy. Moderate canal stenosis. Moderate to severe bilateral neural foraminal narrowing. IMPRESSION: MR THORACIC SPINE IMPRESSION 1. No fracture, malalignment or acute osseous process. 2. Subcentimeter cord edema T7-8, less likely myelomalacia. 12 mm segment suspected myelomalacia T9-10. No syrinx. 3. Degenerative change of the lumbar spine resulting in moderate canal stenosis T9-10 and T10-11. Multilevel mild canal stenosis. 4. Multilevel neural foraminal narrowing: Severe at T2-3, T9-10 and T10-11. 5. Moderate to severe paraspinal muscle atrophy. MR LUMBAR SPINE IMPRESSION 1. Habitus and motion degraded examination. Similar grade 1 L2-3 retrolisthesis and grade 1 L4-5  anterolisthesis. No fracture. 2. L2-3 through L4-5 laminectomies. 2.9 x 4.3 x 6.1 cm fluid collection along surgical approach contiguous with RIGHT L4-5 facet effusion, septic arthropathy possible. Consider sampling. 3. Moderate to severe suspected canal stenosis L4-5 impart due to fluid collection, potentially overestimated. Moderate canal stenosis L5-S1. 4. Neural foraminal narrowing all lumbar levels: Severe at L4-5. 5. Severe paraspinal muscle atrophy, early LEFT psoas muscle atrophy. Electronically Signed   By: Elon Alas M.D.   On: 06/28/2018 03:10   Mr Lumbar Spine Wo Contrast  Result Date: 06/28/2018 CLINICAL DATA:  Worsening low back pain radiating to bilateral lower extremity with numbness for several months. RIGHT leg weakness today, bilateral lower extremity paralysis for 2 days. Status post lumbar spine surgery February 2019. EXAM: MRI THORACIC AND LUMBAR SPINE WITHOUT CONTRAST TECHNIQUE: Multiplanar and multiecho pulse sequences of the thoracic and lumbar spine were obtained without intravenous contrast. COMPARISON:  MRI lumbar spine July 29, 2017 FINDINGS: MRI THORACIC SPINE FINDINGS ALIGNMENT: Maintenance of the thoracic kyphosis. No malalignment. VERTEBRAE/DISCS: Vertebral bodies are intact. Severe disc height loss T3-4 through T 910 with diffuse disc desiccation and moderate subacute discogenic endplate changes. No acute or abnormal bone marrow signal. Borderline congenital canal narrowing on the basis of foreshortened pedicles. CORD: Subcentimeter holo cord T2 bright signal at T7-8. 12 mm segment T2 bright signal spinal cord at T9-10, potential myelomalacia. No syrinx. PREVERTEBRAL AND PARASPINAL SOFT TISSUES: Symmetric moderate to severe paraspinal muscle atrophy. DISC LEVELS: T1-2: No disc bulge. Mild facet arthropathy. No canal stenosis. Mild neural foraminal narrowing. T2-3: Small broad-based disc bulge with superimposed 4 mm LEFT central disc protrusion. Mild facet arthropathy.  Moderate canal stenosis. Severe RIGHT and moderate LEFT neural foraminal narrowing. T3-4, T4-5: No disc bulge, canal stenosis nor neural foraminal narrowing. T5-6: Bilateral subarticular disc protrusions measuring to 5 mm on the LEFT. Mild facet arthropathy. Mild canal stenosis. Moderate to severe RIGHT and moderate LEFT neural foraminal narrowing. T6-7: Small broad-based disc bulge minimal facet arthropathy. No canal stenosis. Moderate RIGHT neural foraminal narrowing. T7-8: 3 mm broad-based disc bulge. Mild facet arthropathy. Mild canal stenosis. Mild neural foraminal narrowing. T8-9: Small RIGHT subarticular disc protrusion. No canal stenosis. Moderate RIGHT and mild LEFT neural foraminal narrowing. T9-10: 5 mm broad-based disc bulge, moderate facet arthropathy. Moderate canal stenosis. Severe bilateral  neural foraminal narrowing. T10-11: 3 mm broad-based disc bulge. Severe facet arthropathy. Moderate canal stenosis. Severe bilateral neural foraminal narrowing. T11-12: Annular bulging. Mild facet arthropathy. No canal stenosis or neural foraminal narrowing. T12-L1: No disc bulge. Moderate facet arthropathy without canal stenosis. Mild LEFT neural foraminal narrowing. MRI LUMBAR SPINE FINDINGS-large body habitus results in decreased signal to noise ratio, mild motion degraded examination. SEGMENTATION: For the purposes of this report, the last well-formed intervertebral disc is reported as L5-S1. ALIGNMENT: Maintained lumbar lordosis. Similar minimal grade 1 L5-S1 anterolisthesis. Grade 1 L2-3 retrolisthesis. VERTEBRAE:Vertebral bodies are intact. Intervertebral discs demonstrate normal morphology, mild desiccation and superimposed mild disc edema lower lumbar discs. Moderate subacute discogenic endplate changes O3-J0. Multilevel mild subacute to chronic discogenic endplate changes. No suspicious bone marrow signal. CONUS MEDULLARIS AND CAUDA EQUINA: Conus medullaris terminates at L1-2 and demonstrates normal  signal. Cauda equina not discretely identified. PARASPINAL AND OTHER SOFT TISSUES: Severe paraspinal muscle denervation/atrophy. Asymmetric LEFT distal iliopsoas muscle atrophy. 2.9 x 4.3 x 6.1 cm central paraspinal fluid collection L4 through S1 along surgical approach. DISC LEVELS: L1-2: Small broad-based disc bulge. Moderate RIGHT and moderate to severe LEFT facet arthropathy. No canal stenosis. Mild LEFT neural foraminal narrowing. L2-3: Retrolisthesis. Small broad-based disc bulge. Status post LEFT hemilaminectomy. Moderate facet arthropathy without canal stenosis. Moderate bilateral neural foraminal narrowing. L3-4: No disc bulge. LEFT and possible RIGHT laminectomies. Severe facet arthropathy. No canal stenosis. Mild neural foraminal narrowing. L4-5: LEFT and possibly RIGHT laminectomies. Severe facet arthropathy with RIGHT facet effusion contiguous with paraspinal fluid collection. RIGHT facet is widened to 3 mm. 5 mm central disc protrusion versus extrusion. Moderate to severe canal stenosis predominately due to fluid collection suspected though, this may be overestimated. Severe bilateral neural foraminal narrowing. L5-S1: Anterolisthesis. Annular bulging. Severe facet arthropathy. Moderate canal stenosis. Moderate to severe bilateral neural foraminal narrowing. IMPRESSION: MR THORACIC SPINE IMPRESSION 1. No fracture, malalignment or acute osseous process. 2. Subcentimeter cord edema T7-8, less likely myelomalacia. 12 mm segment suspected myelomalacia T9-10. No syrinx. 3. Degenerative change of the lumbar spine resulting in moderate canal stenosis T9-10 and T10-11. Multilevel mild canal stenosis. 4. Multilevel neural foraminal narrowing: Severe at T2-3, T9-10 and T10-11. 5. Moderate to severe paraspinal muscle atrophy. MR LUMBAR SPINE IMPRESSION 1. Habitus and motion degraded examination. Similar grade 1 L2-3 retrolisthesis and grade 1 L4-5 anterolisthesis. No fracture. 2. L2-3 through L4-5 laminectomies.  2.9 x 4.3 x 6.1 cm fluid collection along surgical approach contiguous with RIGHT L4-5 facet effusion, septic arthropathy possible. Consider sampling. 3. Moderate to severe suspected canal stenosis L4-5 impart due to fluid collection, potentially overestimated. Moderate canal stenosis L5-S1. 4. Neural foraminal narrowing all lumbar levels: Severe at L4-5. 5. Severe paraspinal muscle atrophy, early LEFT psoas muscle atrophy. Electronically Signed   By: Elon Alas M.D.   On: 06/28/2018 03:10   Ct Aspiration  Result Date: 06/28/2018 INDICATION: History of spinal surgery in February 2019, now with bilateral lower extremity paralysis for the past 2 days. Lumbar spine MRI demonstrated an indeterminate fluid collection posterior to the L4-L5 vertebral bodies. Request made for image guided fluid aspiration for Gram stain and culture analysis. EXAM: CT GUIDANCE NEEDLE PLACEMENT COMPARISON:  Lumbar spine MRI - 06/28/2018 MEDICATIONS: The patient is currently admitted to the hospital and receiving intravenous antibiotics. The antibiotics were administered within an appropriate time frame prior to the initiation of the procedure. ANESTHESIA/SEDATION: Moderate (conscious) sedation was employed during this procedure. A total of Fentanyl 150 mcg was administered intravenously.  Moderate Sedation Time: 15 minutes. The patient's level of consciousness and vital signs were monitored continuously by radiology nursing throughout the procedure under my direct supervision. CONTRAST:  None COMPLICATIONS: None immediate. PROCEDURE: Informed written consent was obtained from the patient after a discussion of the risks, benefits and alternatives to treatment. The patient was placed prone on the CT gantry and a pre procedural CT was performed re-demonstrating the known abscess/fluid collection within the soft tissues posterior to the L4-L5 vertebral body with dominant air in fluid containing collection measuring approximately 5.9 x  2.7 cm (image 37, series 2). The procedure was planned. A timeout was performed prior to the initiation of the procedure. The skin overlying the midline of the lower back was prepped and draped in the usual sterile fashion. The overlying soft tissues were anesthetized with 1% lidocaine with epinephrine. Appropriate trajectory was planned with the use of a 22 gauge spinal needle. An 18 gauge trocar needle was advanced into the abscess/fluid collection. Appropriate positioning was confirmed with a limited CT scan. Approximately 7 cc of bloody fluid was aspirated as the needle was slowly retracted. All aspirated fluid was capped and sent to the laboratory for analysis. A dressing was placed. The patient tolerated the procedure well without immediate post procedural complication. IMPRESSION: Successful CT guided aspiration of approximately 7 cc of bloody fluid from dominant fluid collection posterior to the L4 and L5 vertebral bodies. Samples were sent to the laboratory as requested by the ordering clinical team. Electronically Signed   By: Sandi Mariscal M.D.   On: 06/28/2018 18:12   Dg Shoulder Left  Result Date: 06/28/2018 CLINICAL DATA:  Fall yesterday with left shoulder pain. EXAM: LEFT SHOULDER - 2+ VIEW COMPARISON:  None. FINDINGS: Mild degenerate change of the [facility] joint and glenohumeral joints. No acute fracture or dislocation. IMPRESSION: No acute findings. Electronically Signed   By: Marin Olp M.D.   On: 06/28/2018 17:34   Dg Knee Complete 4 Views Left  Result Date: 06/28/2018 CLINICAL DATA:  Fall yesterday with left knee pain. EXAM: LEFT KNEE - COMPLETE 4+ VIEW COMPARISON:  None. FINDINGS: Moderate degenerative change of the lateral compartment with moderate to severe degenerative changes of the medial compartment and patellofemoral joints. No acute fracture or dislocation. Joint effusion is present. IMPRESSION: No acute fracture. Moderate to severe osteoarthritic change.  Joint effusion.  Electronically Signed   By: Marin Olp M.D.   On: 06/28/2018 17:33    Impression/Recommendations Principal Problem:   Spondylosis, thoracic, with myelopathy Active Problems:   Thoracic myelopathy   Abscess in epidural space of L2-L5 lumbar spine   Acute urinary retention   Left cervical radiculopathy   Leukocytosis   Postoperative seroma involving nervous system after nervous system procedure   Shoulder pain, bilateral  Acute renal failure: secondary to ATN in setting of infection and NSAID use vs urinary retention related vs pre renal. Also received contrasted imaging studies. Obtain renal USG, continue hydration. He had AKI in 12/2013 with creatinine peaking to 3.4 in the setting of ureteric stone (normalized to 0.9 subsequently) and in 2/19 his creatinine was 1.3. Urology consulted by primary team for possible neurogenic bladder.  Will add Flomax for now.  Had indwelling foley placed yesterday..Echo for EF . Labs in am. Avoid NSAIDS and other nephrotoxic agents. Off vancomycin now.  MSSA sepsis due to epidural abscess: White count improving. CRP is elevated at 30 and ESR at 56. Blood cultures are positive for MSSA, lumbar fluid collection aspirate is  culture positive for MSSA as well.Continue Ancef per ID recommendations. 2D echo ordered. Defer need for TEE to ID. Planned for Incision, drainage and debridement of the posterior lumbar abscess  By primary team this evening.  Morbid Obesity: s/p bariatric surgery in the past and lost nearly 100 lbs but had regained much of the weight he lost plan to perform Incision, drainage and debridement of the posterior lumbar abscess.   Lumbosacral spinal stenosis , recent surgery and now with infection: Mx per primary team. Planned for Incision, drainage and debridement of the posterior lumbar abscess this evening  Cervical and thoracic myelopathic cord:  Dr. Deri Fuelling consulted by primary team  Left wrist swelling : Lipoma versus septic  embolus/abscess.  Tender on exam with limited range of motion along the wrist joint.  Patient did not notice this until yesterday.  Will obtain x-ray/ultrasonogram   CODE STATUS: Full code. Discussed with patient and wife in detail about inference and plan.  All questions answered. Thank you for this consultation.  Our [facility] hospitalist team will follow the patient with you.   Time Spent: 59 minutes  [place] Shi M.D. [facility] 06/29/2018, 12:43 PM

## 2018-06-30 ENCOUNTER — Inpatient Hospital Stay (HOSPITAL_COMMUNITY): Payer: No Typology Code available for payment source

## 2018-06-30 ENCOUNTER — Encounter (HOSPITAL_COMMUNITY): Payer: Self-pay | Admitting: Specialist

## 2018-06-30 DIAGNOSIS — M009 Pyogenic arthritis, unspecified: Secondary | ICD-10-CM | POA: Diagnosis present

## 2018-06-30 DIAGNOSIS — Z978 Presence of other specified devices: Secondary | ICD-10-CM

## 2018-06-30 LAB — BASIC METABOLIC PANEL
ANION GAP: 11 (ref 5–15)
BUN: 58 mg/dL — ABNORMAL HIGH (ref 6–20)
CHLORIDE: 108 mmol/L (ref 98–111)
CO2: 22 mmol/L (ref 22–32)
Calcium: 8.4 mg/dL — ABNORMAL LOW (ref 8.9–10.3)
Creatinine, Ser: 2.47 mg/dL — ABNORMAL HIGH (ref 0.61–1.24)
GFR, EST AFRICAN AMERICAN: 31 mL/min — AB (ref >60)
GFR, EST NON AFRICAN AMERICAN: 27 mL/min — AB (ref >60)
Glucose, Bld: 158 mg/dL — ABNORMAL HIGH (ref 70–99)
POTASSIUM: 3.5 mmol/L (ref 3.5–5.1)
SODIUM: 141 mmol/L (ref 135–145)

## 2018-06-30 LAB — CBC
HCT: 38.9 % — ABNORMAL LOW (ref 39.0–52.0)
HEMOGLOBIN: 12.5 g/dL — AB (ref 13.0–17.0)
MCH: 28.1 pg (ref 26.0–34.0)
MCHC: 32.1 g/dL (ref 30.0–36.0)
MCV: 87.4 fL (ref 78.0–100.0)
Platelets: 124 10*3/uL — ABNORMAL LOW (ref 150–400)
RBC: 4.45 MIL/uL (ref 4.22–5.81)
RDW: 15.6 % — ABNORMAL HIGH (ref 11.5–15.5)
WBC: 13.7 10*3/uL — AB (ref 4.0–10.5)

## 2018-06-30 LAB — GLUCOSE, BODY FLUID OTHER: Glucose, Body Fluid Other: <2 mg/dL

## 2018-06-30 LAB — PROTEIN, BODY FLUID (OTHER): TOTAL PROTEIN, BODY FLUID OTHER: 3.9 g/dL

## 2018-06-30 LAB — PROCALCITONIN: PROCALCITONIN: 5.07 ng/mL

## 2018-06-30 NOTE — NC FL2 (Signed)
[place] MEDICAID FL2 LEVEL OF CARE SCREENING TOOL     IDENTIFICATION  Patient Name: [name] [name] Birthdate: May 25, 1956 Sex: male Admission Date (Current Location): 06/27/2018  [facility] [place]   Facility and Address:  [facility], [address], [place], [place] [address]      Provider Number: [id number]  Attending Physician Name and Address:  Kerrin ChampagneNitka, James E, MD  Relative Name and Phone Number:       Current Level of Care: Hospital Recommended Level of Care: Skilled Nursing Facility Prior Approval Number:    Date Approved/Denied:   PASRR Number: [id number]  Discharge Plan: SNF    Current Diagnoses: Patient Active Problem List   Diagnosis Date Noted  . Leukocytosis   . Postoperative seroma involving nervous system after nervous system procedure   . Shoulder pain, bilateral   . Thoracic myelopathy 06/28/2018  . Spondylosis, thoracic, with myelopathy 06/28/2018    Class: Acute  . Abscess in epidural space of L2-L5 lumbar spine 06/28/2018    Class: Acute  . Acute urinary retention 06/28/2018  . Left cervical radiculopathy 06/28/2018    Class: Acute  . Spinal stenosis of lumbar region 11/17/2017    Class: Chronic  . Status post lumbar laminectomy 11/17/2017  . Lumbar stenosis with neurogenic claudication 08/18/2017  . Forestier's disease of thoracolumbar region 08/18/2017  . Degenerative disc disease, lumbar 08/18/2017  . Lumbar radiculopathy 09/30/2016  . Morbid (severe) obesity due to excess calories (HCC) 09/30/2016  . Biliary calculus with acute cholecystitis 03/21/2016  . Acute pyelonephritis 12/28/2013  . Ureteral stone with hydronephrosis 12/28/2013  . Acute kidney failure (HCC) 12/27/2013  . Severe sepsis with acute organ dysfunction (HCC) 12/27/2013  . Hypotension 12/27/2013  . Lactic acidosis 12/27/2013  . Sepsis secondary to UTI (HCC) 12/27/2013  . EMPYEMA CHEST 08/30/2008  . PRURIGO 08/30/2008  .  OBSTRUCTIVE SLEEP APNEA 08/30/2008    Orientation RESPIRATION BLADDER Height & Weight     Self, Time, Situation, Place  Normal Continent, External catheter Weight: (!) 325 lb 13.4 oz (147.8 kg) Height:  5\' 10"  (177.8 cm)  BEHAVIORAL SYMPTOMS/MOOD NEUROLOGICAL BOWEL NUTRITION STATUS      Continent Diet(see discharge summary)  AMBULATORY STATUS COMMUNICATION OF NEEDS Skin   Limited Assist Verbally Surgical wounds, Wound Vac(sugical wound left knee incision, wound vac on spine and hemo vac on knee)                       Personal Care Assistance Level of Assistance  Bathing, Feeding, Dressing Bathing Assistance: Limited assistance Feeding assistance: Independent Dressing Assistance: Limited assistance     Functional Limitations Info  Sight, Hearing, Speech Sight Info: Adequate Hearing Info: Adequate Speech Info: Adequate    SPECIAL CARE FACTORS FREQUENCY  PT (By licensed PT), OT (By licensed OT)     PT Frequency: 5x weekly OT Frequency: 5x weekly            Contractures Contractures Info: Not present    Additional Factors Info  Code Status, Allergies Code Status Info: Full Allergies Info: Allergies:  Unasyn Ampicillin-sulbactam Sodium           Current Medications (06/30/2018):  This is the current hospital active medication list Current Facility-Administered Medications  Medication Dose Route Frequency Provider Last Rate Last Dose  . 0.9 %  sodium chloride infusion   Intravenous Continuous Kerrin ChampagneNitka, James E, MD 125 mL/hr at 06/30/18 0910    . 0.9 %  sodium chloride infusion  250 mL Intravenous Continuous Kerrin Champagne, MD      . acetaminophen (TYLENOL) tablet 650 mg  650 mg Oral Q6H PRN Kerrin Champagne, MD       Or  . acetaminophen (TYLENOL) suppository 650 mg  650 mg Rectal Q6H PRN Kerrin Champagne, MD      . acetaminophen (TYLENOL) tablet 1,000 mg  1,000 mg Oral Q6H Kerrin Champagne, MD   1,000 mg at 06/30/18 1209  . alum & mag hydroxide-simeth (MAALOX/MYLANTA)  200-200-20 MG/5ML suspension 30 mL  30 mL Oral Q6H PRN Kerrin Champagne, MD      . bisacodyl (DULCOLAX) EC tablet 5 mg  5 mg Oral Daily PRN Kerrin Champagne, MD      . calcium-vitamin D (OSCAL WITH D) 500-200 MG-UNIT per tablet 1 tablet  1 tablet Oral Daily Kerrin Champagne, MD   1 tablet at 06/30/18 0904  . ceFAZolin (ANCEF) IVPB 2g/100 mL premix  2 g Intravenous Q8H Kerrin Champagne, MD 200 mL/hr at 06/30/18 0522 2 g at 06/30/18 0522  . docusate sodium (COLACE) capsule 100 mg  100 mg Oral BID Kerrin Champagne, MD   100 mg at 06/30/18 0905  . DULoxetine (CYMBALTA) DR capsule 60 mg  60 mg Oral Daily Kerrin Champagne, MD   60 mg at 06/30/18 0904  . enoxaparin (LOVENOX) injection 30 mg  30 mg Subcutaneous Q24H Kerrin Champagne, MD   30 mg at 06/30/18 0903  . HYDROcodone-acetaminophen (NORCO) 10-325 MG per tablet 1-2 tablet  1-2 tablet Oral Q4H PRN Kerrin Champagne, MD   1 tablet at 06/29/18 0520  . HYDROcodone-acetaminophen (NORCO) 7.5-325 MG per tablet 1 tablet  1 tablet Oral Q6H Kerrin Champagne, MD   1 tablet at 06/30/18 0028  . menthol-cetylpyridinium (CEPACOL) lozenge 3 mg  1 lozenge Oral PRN Kerrin Champagne, MD       Or  . phenol (CHLORASEPTIC) mouth spray 1 spray  1 spray Mouth/Throat PRN Kerrin Champagne, MD      . methocarbamol (ROBAXIN) tablet 500 mg  500 mg Oral Q6H PRN Kerrin Champagne, MD   500 mg at 06/28/18 2238   Or  . methocarbamol (ROBAXIN) 500 mg in dextrose 5 % 50 mL IVPB  500 mg Intravenous Q6H PRN Kerrin Champagne, MD      . morphine 2 MG/ML injection 1 mg  1 mg Intravenous Q2H PRN Kerrin Champagne, MD      . ondansetron [facility]) tablet 4 mg  4 mg Oral Q6H PRN Kerrin Champagne, MD       Or  . ondansetron [facility]) injection 4 mg  4 mg Intravenous Q6H PRN Kerrin Champagne, MD   4 mg at 06/29/18 2111  . pantoprazole (PROTONIX) EC tablet 40 mg  40 mg Oral BID AC Kerrin Champagne, MD   40 mg at 06/30/18 0903  . polyethylene glycol (MIRALAX / GLYCOLAX) packet 17 g  17 g Oral Daily PRN Kerrin Champagne, MD      .  sodium chloride flush (NS) 0.9 % injection 3 mL  3 mL Intravenous Q12H Kerrin Champagne, MD   3 mL at 06/30/18 0905  . sodium chloride flush (NS) 0.9 % injection 3 mL  3 mL Intravenous PRN Kerrin Champagne, MD      . sodium phosphate (FLEET) 7-19 GM/118ML enema 1 enema  1 enema Rectal Once PRN Vira Browns  E, MD      . tamsulosin (FLOMAX) capsule 0.4 mg  0.4 mg Oral Daily Kerrin Champagne, MD   0.4 mg at 06/30/18 0904  . vitamin B-12 (CYANOCOBALAMIN) tablet 1,000 mcg  1,000 mcg Oral Daily Kerrin Champagne, MD   1,000 mcg at 06/30/18 1610  . zolpidem (AMBIEN) tablet 5 mg  5 mg Oral QHS PRN,MR X 1 Kerrin Champagne, MD         Discharge Medications: Please see discharge summary for a list of discharge medications.  Relevant Imaging Results:  Relevant Lab Results:   Additional Information SSN: [id number]  Gildardo Griffes, LCSW

## 2018-06-30 NOTE — Progress Notes (Addendum)
    [facility] has been requested to perform a transesophageal echocardiogram on [name] [name] for bacteremia.  After careful review of history and examination, the risks and benefits of transesophageal echocardiogram have been explained including risks of esophageal damage, perforation (1:10,000 risk), bleeding, pharyngeal hematoma as well as other potential complications associated with conscious sedation including aspiration, arrhythmia, respiratory failure and death. Alternatives to treatment were discussed, questions were answered. Patient is willing to proceed.   [name] has severe cervical stenosis but has normal ROM.  He has infection in the lumbar spine but no evidence of infection in the cervical spine.   The TEE is scheduled for 07/01/18 at 12:00 with Dr. Shirlee LatchMcLean.   Berton BonJanine Charma Mocarski, NP  06/30/2018 4:09 PM

## 2018-06-30 NOTE — Progress Notes (Signed)
CSW provided wife and pt with list of SNFs. Wife reports they will choose and CSW will follow up tomorrow morning for decision.   Parshall[place]ley Sheria [place], [place] [phone]

## 2018-06-30 NOTE — Progress Notes (Signed)
[facility]  Date of Admission:  06/27/2018     Total days of antibiotics 3         ASSESSMENT/PLAN  [name] [name] has MSSA bacteremia with lumbar abscess being the likely source of infection. He is POD #1 from lumbar wound drainage and debridement with VAC placement as well as left knee aspiration. Additional cultures have been sent. TTE with no vegetations. He appears to have acute kidney injury with elevating creatinine as his baseline was normal in January of this year. He will likely need a central line placed secondary to his kidney function for prolonged antibiotics. Repeat blood cultures drawn this morning and are pending. Neurosurgery recommends additional surgical intervention once infection clears.   1. Continue current dose of Cefazolin. 2. Continue to monitor cultures. No central line placement until blood cultures are confirmed clear.  3. Renal function per internal medicine.     Principal Problem:   Spondylosis, thoracic, with myelopathy Active Problems:   Thoracic myelopathy   Abscess in epidural space of L2-L5 lumbar spine   Acute urinary retention   Left cervical radiculopathy   Leukocytosis   Postoperative seroma involving nervous system after nervous system procedure   Shoulder pain, bilateral   . acetaminophen  1,000 mg Oral Q6H  . calcium-vitamin D  1 tablet Oral Daily  . docusate sodium  100 mg Oral BID  . DULoxetine  60 mg Oral Daily  . enoxaparin (LOVENOX) injection  30 mg Subcutaneous Q24H  . HYDROcodone-acetaminophen  1 tablet Oral Q6H  . pantoprazole  40 mg Oral BID AC  . sodium chloride flush  3 mL Intravenous Q12H  . tamsulosin  0.4 mg Oral Daily  . vitamin B-12  1,000 mcg Oral Daily    SUBJECTIVE:  Afebrile overnight. Vital signs stable. POD #1 from lumbar wound debridement and drainage and irrigation with wound VAC placement to the lumbar spine and Hemovac drain placed for aspiration of the left knee. Neurosurgery  consulted and recommends additional surgery when infection clears. Creatinine elevated today to 2.41. WBC count of 13.7. Currently having no pain and doing well.   Allergies  Allergen Reactions  . Unasyn [Ampicillin-Sulbactam Sodium] Hives, Itching, Swelling and Rash     Review of Systems: Review of Systems  Constitutional: Negative for chills, fever and malaise/fatigue.  Respiratory: Negative for cough, sputum production, shortness of breath and wheezing.   Cardiovascular: Negative for chest pain and leg swelling.  Gastrointestinal: Negative for abdominal pain, constipation, diarrhea, nausea and vomiting.  Musculoskeletal: Negative for back pain and joint pain.  Skin: Negative for rash.  Neurological: Negative for headaches.    OBJECTIVE: Vitals:   06/29/18 2221 06/29/18 2242 06/30/18 0044 06/30/18 0334  BP:  117/67 120/70 114/72  Pulse: 96 98 97 88  Resp: 17 18 18 18   Temp:  98.2 F (36.8 C) 98.5 F (36.9 C) 97.7 F (36.5 C)  TempSrc:  Oral Oral Oral  SpO2: 94% 100% 100% 99%  Weight:      Height:       Body mass index is 46.75 kg/m.  Physical Exam  Constitutional: He is oriented to person, place, and time. He appears well-developed and well-nourished. No distress.  Obese male lying in the bed with head of bed elevated.   Cardiovascular: Normal rate, regular rhythm, normal heart sounds and intact distal pulses. Exam reveals no gallop and no friction rub.  No murmur heard. Pulmonary/Chest: Effort normal and breath sounds normal.  Abdominal:  Soft. Bowel sounds are normal. He exhibits no distension.  Musculoskeletal:  Left knee with hemovac drain in place with serous drainage noted. Lumbar spine with VAC in place with serosanguinous drainage of about 180 ml since surgery.  Neurological: He is alert and oriented to person, place, and time.  Decreased dorsiflexion and plantar flexion at the ankle with decreased sensation of of the distal extremity. Pulses are intact.     Skin: Skin is warm and dry.  Psychiatric: He has a normal mood and affect. His behavior is normal. Judgment and thought content normal.    Lab Results Lab Results  Component Value Date   WBC 13.7 (H) 06/30/2018   HGB 12.5 (L) 06/30/2018   HCT 38.9 (L) 06/30/2018   MCV 87.4 06/30/2018   PLT 124 (L) 06/30/2018    Lab Results  Component Value Date   CREATININE 2.47 (H) 06/30/2018   BUN 58 (H) 06/30/2018   NA 141 06/30/2018   K 3.5 06/30/2018   CL 108 06/30/2018   CO2 22 06/30/2018    Lab Results  Component Value Date   ALT 28 06/29/2018   AST 59 (H) 06/29/2018   ALKPHOS 99 06/29/2018   BILITOT 1.6 (H) 06/29/2018     Microbiology: Recent Results (from the past 240 hour(s))  Culture, Urine     Status: Abnormal   Collection Time: 06/28/18 11:59 AM  Result Value Ref Range Status   Specimen Description URINE, CATHETERIZED  Final   Special Requests   Final    Normal Performed at [facility], [address], [place], [place] [address]    Culture MULTIPLE SPECIES PRESENT, SUGGEST RECOLLECTION (A)  Final   Report Status 06/29/2018 FINAL  Final  Culture, blood (routine x 2)     Status: None (Preliminary result)   Collection Time: 06/28/18  3:30 PM  Result Value Ref Range Status   Specimen Description BLOOD LEFT ANTECUBITAL  Final   Special Requests   Final    BOTTLES DRAWN AEROBIC AND ANAEROBIC Blood Culture adequate volume   Culture  Setup Time   Final    GRAM POSITIVE COCCI IN BOTH AEROBIC AND ANAEROBIC BOTTLES CRITICAL RESULT CALLED TO, READ BACK BY AND VERIFIED WITH: G. ABBOTT PHARMD, AT 0724 06/29/18 BY D. VANHOOK    Culture   Final    NO GROWTH < 24 HOURS Performed at [facility], [address], [place], [place] [address]    Report Status PENDING  Incomplete  Culture, blood (routine x 2)     Status: Abnormal (Preliminary result)   Collection Time: 06/28/18  3:35 PM  Result Value Ref Range Status   Specimen Description BLOOD LEFT HAND  Final    Special Requests   Final    BOTTLES DRAWN AEROBIC AND ANAEROBIC Blood Culture adequate volume   Culture  Setup Time   Final    GRAM POSITIVE COCCI IN BOTH AEROBIC AND ANAEROBIC BOTTLES CRITICAL RESULT CALLED TO, READ BACK BY AND VERIFIED WITH: Lieutenant Diego, AT 0724 06/29/18 BY Renato Shin Performed at [facility], [address], [place], [place] [address]    Culture STAPHYLOCOCCUS AUREUS (A)  Final   Report Status PENDING  Incomplete  Blood Culture ID Panel (Reflexed)     Status: Abnormal   Collection Time: 06/28/18  3:35 PM  Result Value Ref Range Status   Enterococcus species NOT DETECTED NOT DETECTED Final   Listeria monocytogenes NOT DETECTED NOT DETECTED Final  Staphylococcus species DETECTED (A) NOT DETECTED Final    Comment: CRITICAL RESULT CALLED TO, READ BACK BY AND VERIFIED WITH: G. ABBOTT PHARMD, AT 0724 06/29/18 BY D. VANHOOK    Staphylococcus aureus DETECTED (A) NOT DETECTED Final    Comment: Methicillin (oxacillin) susceptible Staphylococcus aureus (MSSA). Preferred therapy is anti staphylococcal beta lactam antibiotic (Cefazolin or Nafcillin), unless clinically contraindicated. CRITICAL RESULT CALLED TO, READ BACK BY AND VERIFIED WITH: G. ABBOTT PHARMD, AT 0724 06/29/18 BY D. VANHOOK    Methicillin resistance NOT DETECTED NOT DETECTED Final   Streptococcus species NOT DETECTED NOT DETECTED Final   Streptococcus agalactiae NOT DETECTED NOT DETECTED Final   Streptococcus pneumoniae NOT DETECTED NOT DETECTED Final   Streptococcus pyogenes NOT DETECTED NOT DETECTED Final   Acinetobacter baumannii NOT DETECTED NOT DETECTED Final   Enterobacteriaceae species NOT DETECTED NOT DETECTED Final   Enterobacter cloacae complex NOT DETECTED NOT DETECTED Final   Escherichia coli NOT DETECTED NOT DETECTED Final   Klebsiella oxytoca NOT DETECTED NOT DETECTED Final   Klebsiella pneumoniae NOT DETECTED NOT DETECTED Final   Proteus species NOT DETECTED NOT DETECTED Final    Serratia marcescens NOT DETECTED NOT DETECTED Final   Haemophilus influenzae NOT DETECTED NOT DETECTED Final   Neisseria meningitidis NOT DETECTED NOT DETECTED Final   Pseudomonas aeruginosa NOT DETECTED NOT DETECTED Final   Candida albicans NOT DETECTED NOT DETECTED Final   Candida glabrata NOT DETECTED NOT DETECTED Final   Candida krusei NOT DETECTED NOT DETECTED Final   Candida parapsilosis NOT DETECTED NOT DETECTED Final   Candida tropicalis NOT DETECTED NOT DETECTED Final    Comment: Performed at [facility], [address] [place], [place] [address]  Aerobic/Anaerobic Culture (surgical/deep wound)     Status: None (Preliminary result)   Collection Time: 06/28/18  5:01 PM  Result Value Ref Range Status   Specimen Description ABSCESS LUMBAR  Final   Special Requests NONE  Final   Gram Stain   Final    ABUNDANT WBC PRESENT, PREDOMINANTLY PMN ABUNDANT GRAM POSITIVE COCCI IN PAIRS IN CLUSTERS    Culture   Final    MODERATE STAPHYLOCOCCUS AUREUS SUSCEPTIBILITIES TO FOLLOW Performed at [facility], [address], [place], [place] [address]    Report Status PENDING  Incomplete  MRSA PCR Screening     Status: None   Collection Time: 06/29/18  4:14 PM  Result Value Ref Range Status   MRSA by PCR NEGATIVE NEGATIVE Final    Comment:        The GeneXpert MRSA Assay (FDA approved for NASAL specimens only), is one component of a comprehensive MRSA colonization surveillance program. It is not intended to diagnose MRSA infection nor to guide or monitor treatment for MRSA infections. Performed at [facility], [address], [place], [place] [address]   Aerobic/Anaerobic Culture (surgical/deep wound)     Status: None (Preliminary result)   Collection Time: 06/29/18  6:08 PM  Result Value Ref Range Status   Specimen Description FLUID LEFT KNEE  Final   Special Requests NONE  Final   Gram Stain   Final    ABUNDANT WBC PRESENT, PREDOMINANTLY PMN RARE GRAM  POSITIVE COCCI RESULT CALLED TO, READ BACK BY AND VERIFIED WITH: DR Otelia Sergeant 06/29/18 2012 Performed at [facility], [address], [place], [place] [address]    Culture PENDING  Incomplete   Report Status PENDING  Incomplete     Marcos Eke, NP [facility] [phone] Pager  06/30/2018  9:07 AM

## 2018-06-30 NOTE — Op Note (Addendum)
06/29/2018  10:56 AM  PATIENT:  [name] [name]  62 y.o. male  MRN: [id number]  OPERATIVE REPORT  PRE-OPERATIVE DIAGNOSIS:  LUMBAR ABCESS(STAPH. A MS), FLUID ON THE LEFT KNEE(RIGHT KNEE EFFUSION)   POST-OPERATIVE DIAGNOSIS:  LUMBAR ABCESS, FLUID ON THE LEFT KNEE CONSISTENT WITH KNEE PYARTHROSIS.   PROCEDURE:  Procedure(s): LUMBAR WOUND DEBRIDEMENT DRAINAGE AND IRRIGATION; AND ASPIRATION OF LEFT KNEE ARTHROSCOPY KNEE UTILIZING PORTALS FOR IRRIGATION AND DRAINAGE OF LEFT KNEE PYARTHROSIS.    SURGEON:  Jessy Oto, MD     ASSISTANT:  Benjiman Core, PA-C  (Present throughout the lumbar procedure and necessary for completion of procedure in a timely manner)     ANESTHESIA:  General, Dr. Ermalene Postin.    COMPLICATIONS:  None.  SPECIMENS: Swab lumbar abcess drainage material for anaerobic and aerobic C&S. Aspirate left knee purulent synovial fluid for Stat gram stain and anerobic and aerobic C&S.   FINDINGS: Left knee pyarthrosis gram stain confirmed intraoperatively. Central and right L4-5 lumbar abscess cavity superficial to the right L4-5 spinal canal entering the right L4-5 facet.     DRAINS: Foley to SD, 32m hemovac left knee, VAC wound dressing lumbar spine.  CONSENT: Verbal consent was obtained from this patient's spouse prior to performing arthroscopic lavage of the left knee pyarthrosis intraoperatively. The patient's intial consent was for aspiration of the left knee and incision, drainage and debridement of the lumbar abscess however due to patients known myelopathy from thoracic and cervical areas of stenosis a further anesthetic was not recommended and immediate drainage and irrigation of the infected left knee was indicated. Therefore emergency consent was obtained from [name] of kin, his spouse.   PROCEDURE:The patient was met in the holding area, and the appropriate left knee and lumbar level L4-5 identified and left knee marked with "x" and my initials, lumbar surgery is  midline and no mark made in keeping with standard marking of surgical areas.The patient was then transported to OR and was placed under general anesthesia without difficulty. The patient received appropriate preoperative therapeutic ancef for staphlococcus areus methacillin sensitive cultures from blood and aspiration of lumbar abscess 06/29/2018. The patient after intubation, the left knee prepped with betadiene solution and standard time out protocol identified the left knee for aspiration. The left knee the aspirated with 18 gauge 1 1/2 inch needle sterilely via an anterolateral approach deep to the superolateral patella. There was immediate return of purulent synovial fluid. This had a creamy yellow grey thick appearance suggestive of  A left knee pyarthrosis. This material was sent to microbiology for anaerobic and aerobic C&S and a stat gram stain. Total of 8Tuliaof purulent synovial fluid drained from the left knee. Bandaid applied to the aspiration site. The patient then atraumatically was transferred to the [facility], carefully protecting the neck from hyperextension due to know findings of cervical and thoracic myelopathic findings on MRI. He was positioned in a prone position, on the [facility] table. All pressure points were well padded. The arms in 90-90 well-padded at the elbows. Standard prep with DuraPrep solution lower dorsal spine to the mid sacral segment. Draped in the usual manner iodine Vi-Drape was used. Time-out procedure was called and correct. The skin incision scar at L4-5 marked.An incision approximately an 4 and a half inches in length was then made through skin and subcutaneous layers ellipsing the old skin incision in line with the  expected midline centered over the puncture wound from previous days aspiration of  the lumbar abscess. An incision made into the midline lumbosacral fascia approximately 4 inches in length along the central add right L4-5  level.an abscess cavity was entered central and right-sided extending down to the right-sided residual lamina of L4 and the medial aspect of the right L4-5 facet. Boss McCollough retractor then placed  The posterior right L4-5 interspace carefully debrided the small amount of scar and purulent material here and microcurettes used to define the previous laminotomy right L4-5. Leksell rongeur was used to resect a small portion of bone off the medial aspect of the right L4-5 facet.55m Kerrison then used to debride the lateral posterior laminotomy site overlying the right spinal canal over the medial aspect of the rightL4 inferior articular process carefully using the Kerrison to debris the attachment as a curet.  This allowed for identification of the thecal sac. An extension abscess cavity clearly entered the right inferior L4-5 facet. This area was debrided with curettes a loose bone fragment of the inferior articular process of L4 was identified and resected with a pituitary rongeur. medially and retracted using a Derricho retractor. The abscess cavity was clearly defined and there were no further extensions deeper and then the L4-5 facet. The abscess did not extend into the lateral recess or of the spinal canal itself appeared to be primarily posterior element and soft tissue superficial to the right laminotomy area at L4-5. Irrigation was then carried out with 12 L of irrigant solution. 500 mL of double antibiotic solution was then used to irrigate the open incision wound. 1 g of vancomycin powder was then spread throughout the deep structures as well as the superficial fascial layers subcutaneously obtaining his layers. A VAC dressing was then applied using foam placed deep in the inferior aspect of the incision site down to the right-sided facet region. This extended up to the very superficial skin continuous areas to engage the sponge on the skin surface. Additional sponge was placed then in the incision site  subcutaneous areas of filling these areas with punch material at least 2 large pieces were used to accomplish this and that a portion of sponge and 2 additional a right-sided sponges placed on the skin level and then the right light dressing material was then applied. Being made onto the sponge and then the exit vacuum and the attachment then applied. This obtained excellent vacuum. Note that it was initial entry into the abscess below the superficial areas and the lumbodorsal fascia with the bullet entry into this abscess cultures were obtained for both anaerobic and aerobic cultures as well as Gram stain  No suture material was used in the dressing only a VAC dressing was applied then from the depths of the wound and this superficial to the right L4-5 facet to the level of the skin. Results of the Gram stain of this patient's left knee returned from microbiology indicating numerous white cells seen with presence of gram-positive cocci on the aspirated material. This is consistent with pyarthrosis of the left knee. Patient's initial consent had been primarily for aspiration of left knee and no consent for arthroscopic surgery had been obtained so that this patient's wife was contacted while he was under general anesthetic and informed of the findings on aspiration of this left knee and she kindly provided consent to allow for left knee arthroscopic irrigation and debridement of the knee using cannulas normally used for arthroscopic surgery. This patient was then carefully transferred to a regular OR table in order to perform the surgical  treatment of the left knee pyarthrosis in the emergency due to the results of this patient's aspiration. Permission had been obtained via phone call to this patient's wife from the operating room.  The left lower extremity was then prepped with a Betadine scrub prep solutions from the left ankle to the left upper thigh. Left leg was then draped in the usual manner for  arthroscopic surgery of the left knee. Timeout protocol was carried out with a entered consent had not been performed with this patient but was obtained via phone with the patient's wife. Initial entry made via a stab incision in the superior medial anterior portal region of the left knee just superior and medial to the superior medial patella with an 11 blade scalpel. Irrigant portal with a 27m inflow cannula was then placed bluntly into the superior medial region of the left knee joint and supra patella area. Immediate purulent material did return from the portal cannula. Additional stab incision was then made in the region of the inferior anterior lateral joint line region of the left knee. This approximately a centimeter and a half below the inferior aspect of the patella initially defining the joint line using an 18-gauge spinal needle. Stab incision made and then a hemostat used to spread the subcutaneous layers down to the knee joint level using the trocar and a high inflow cannula then the inflow cannula was placed into the lateral joint line. This was then exchanged standard arthroscope sleeve. Sleeve directed and into the joint. Through the superior medial inflow cannula and then irrigation was begun using energy irrigation solution a total of 12 L of irrigant solution passed through the left knee clear drainage was obtained. Placed the range of motion flexion extension in order to the debris or purulent debris from the joint. Finally 250 mL of antibiotic solution was then placed into the left knee joint performing the irrigation and increments then allowed for the irrigation to fill and distend the knee joint the knee joint was then milked and then drained of the irrigant solution this was done about 3 or 4 times. No further purulent matter material was noted. Scopic evaluation using the arthroscope was not performed during this procedure as this was a lavage type procedure for a pyarthrosis of the left  knee joint.4 mm Hemovac drain was then placed through the superior medial cannula into the knee joint line along its medial and deep to the patella area in order to drain the knee joint. The cannula was then removed over the drain leaving the drain in place. The cannula for the arthroscope was then removed from the inferior lateral anterior portal region. The knee was milked of any residual drainage or fluid within the knee joint. The 4 mm Hemovac drain was then sewn in place using a 3-0 nylon suture. The inferior anterior lateral portal closed with a single horizontal mattress suture of 3-0 nylon. Dressing using Adaptic 4 x 4's ABDs pad fixed the skin with a sterile web roll and then 6 inch Ace wraps from the left foot to the upper thigh applied. Patient was then reactivated after he was transferred to the [facility] was returned to the recovery room in satisfactory condition all instrument and sponge counts were correct. The estimated blood loss was 150 mL for the area is then to 10 mL for the area of the left knee. No local anesthetic used in any of these areas due to infection. Culture material from both left knee  and lumbar areas worst sent for culture and sensitivities.  Benjiman Core, PA-C perform the duties of assistant surgeon during this case. He was present from the beginning of the case to the end of the case assisting in transfer the patient from his stretcher to the OR table and back to the stretcher at the end of the case. Assisted in careful retraction and suction of the laminectomy site delicate neural structures.     Basil Dess  06/30/2018, 10:56 AM

## 2018-06-30 NOTE — Progress Notes (Signed)
PT Cancellation Note  Patient Details Name: [name] [name] MRN: [id number] DOB: Sep 12, 1956   Cancelled Treatment:    Reason Eval/Treat Not Completed: Patient at procedure or test/unavailable. Pt off unit for ultrasound at this time. Will check back as schedule allows to initiate PT eval.    Marylynn PearsonLaura D Verenice Westrich 06/30/2018, 10:16 AM   Conni SlipperLaura Wanza Szumski, PT, DPT Acute Rehabilitation Services Pager: [phone] Office: [phone]

## 2018-06-30 NOTE — Clinical Social Work Note (Signed)
Clinical Social Work Assessment  Patient Details  Name: [name] [name] MRN: [id number] Date of Birth: 1955/11/16  Date of referral:  06/30/18               Reason for consult:  Facility Placement                Permission sought to share information with:  Family Supports, Magazine features editoracility Contact Representative Permission granted to share information::     Name::     Brett AlbinoBeverly Willmott   Agency::  [facility]  Relationship::  spouse   Contact Information:  [phone]  Housing/Transportation Living arrangements for the past 2 months:  Single Family Home Source of Information:  Patient, Spouse Patient Interpreter Needed:  None Criminal Activity/Legal Involvement Pertinent to Current Situation/Hospitalization:  No - Comment as needed Significant Relationships:  Spouse Lives with:  Spouse Do you feel safe going back to the place where you live?  No Need for family participation in patient care:  Yes (Comment)  Care giving concerns:  CSW received consult for discharge needs. CSW spoke with patient and his wife  regarding PT recommendation of SNF placement at time of discharge. Patient's spouse states she is unable to provide the level of care needed for patient and was in agreement with SNF placement.    Social Worker assessment / plan:  CS[facility]  Employment status:  Disabled (Comment on whether or not currently receiving Disability) Insurance information:  Medicare PT Recommendations:  Skilled Nursing Facility Information / Referral to community resources:  Skilled Nursing Facility  Patient/Family's Response to care:  Patient is realistic regarding therapy needs and expressed being hopeful for SNF placement. Patient expressed understanding of CSW role and discharge process as well as medical condition. No questions/concerns about plan or treatment.   Patient/Family's Understanding of and Emotional Response to Diagnosis,  Current Treatment, and Prognosis:  Patient is realistic regarding therapy needs and expressed being hopeful for SNF placement. Patient expressed understanding of CSW role and discharge process as well as medical condition. No questions/concerns about plan or treatment.   Emotional Assessment Appearance:  Appears stated age Attitude/Demeanor/Rapport:  Engaged(cooperative, pleasant) Affect (typically observed):  Appropriate, Hopeful Orientation:  Oriented to Self, Oriented to Place, Oriented to  Time, Oriented to Situation Alcohol / Substance use:  Not Applicable Psych involvement (Current and /or in the community):  No (Comment)  Discharge Needs  Concerns to be addressed:  Basic Needs Readmission within the last 30 days:  No Current discharge risk:  Dependent with Mobility Barriers to Discharge:  Continued Medical Work up   Enterprise ProductsCynthia N Floreen Teegarden, LCSWA 06/30/2018, 12:24 PM

## 2018-06-30 NOTE — Progress Notes (Signed)
PT Cancellation Note  Patient Details Name: [name] [name] MRN: [id number] DOB: Jun 01, 1956   Cancelled Treatment:    Reason Eval/Treat Not Completed: Pain limiting ability to participate. Second attempt to initiate PT eval this date. Pt refusing to participate, stating he is "too tired and in too much pain". Max encouragement provided but pt continued to politely decline. Will continue to follow and complete PT eval when pt agreeable.   Marylynn PearsonLaura D Bethannie Iglehart 06/30/2018, 11:49 AM   Conni SlipperLaura Kapri Nero, PT, DPT Acute Rehabilitation Services Pager: [phone] Office: [phone]

## 2018-06-30 NOTE — Progress Notes (Signed)
OT Cancellation Note  Patient Details Name: [name] [name] MRN: [id number] DOB: 05-24-1956   Cancelled Treatment:      Reason Eval/Treat Not Completed: Patient at procedure or test/unavailable. Pt off unit for ultrasound at this time. Will check back as schedule allows to initiate OT eval.   Danaria Larsen Beth Dixon, OTR/L  06/30/2018, 10:40 AM

## 2018-06-30 NOTE — Progress Notes (Signed)
PROGRESS NOTE                                                                                                                                                                                                             Patient Demographics:    [name] [name], is a 62 y.o. male, DOB - 1956/08/17, HYQ:[id number]  Admit date - 06/27/2018   Admitting Physician Kerrin Champagne, MD  Outpatient Primary MD for the patient is Renford Dills, MD  LOS - 2    Chief Complaint  Patient presents with  . Fall  . Hip Pain       Brief Narrative   62 year old morbidly obese male with history of lumbar laminectomy of L4-L5 and L5-S1 with redo multilevel lumbar central laminectomy for recurrent lumbar spinal stenosis in February this year, severe bilateral knee and hip osteoarthritis, nephrolithiasis who was admitted to the hospital under orthopedic service on 9/15 with worsened weakness of his legs and difficulty getting out of bed associated with low back pain not relieved with ibuprofen and Mobic he was taking at home.  He was being evaluated by Dr. August Saucer (orthopedics) for bilateral shoulder pain and weakness and being considered for shoulder replacement but concerned on doing so due to his size and dependency of his arm for transfer postop.  Patient started noticing weakness in the left arm and numbness since his recent fall 1 week back.  MRI of the thoracic and lumbar spine on admission showed intense cord T2 signal at T9-10 with fluid collection at L4-5 level contiguous with right L4-5 facet suspicious for abscess.  MRI of the cervical spine was done given new upper extremity weakness showing short segment myelomalacia at C4-5 and C5-6 with degenerative changes and canal narrowing with severe canal stenosis at C5-6. Patient underwent Eiad assisted drainage of L2-L5 epidural abscess.  Also had MSSA bacteremia for which she was started on  Ancef. Patient was taken to the OR on 9/16 for I&D of the lumbar abscess.  Hospital course also complicated with development of acute kidney injury. Hospitalist consulted for further management of this.   Subjective:   Patient seen and examined.  Reports some pain, left lower leg and left upper arm.   Assessment  & Plan :    Principal Problem: Acute kidney injury (HCC) Likely ATN secondary to combination of  acute infection, NSAIDs use at home and acute urinary retention.  He had also received contrast for imaging. Continue IV hydration.  Creatinine further worsened this morning.  Avoid all nephrotoxins.  Added Flomax.  Urology was consulted by primary team possible neurogenic bladder. IV vancomycin discontinued.  Follow UA and urine lites.  Renal ultrasound negative for hydronephrosis and shows nonobstructing right lower pole calculus. Monitor strict I/O.  If no further improvement in a.m. or worsening will need nephrology consult.  Sepsis secondary to MSSA bacteremia and lumbar epidural abscess. WBC improving.  Afebrile.  Blood culture pansensitive.  Continue Ancef.  Antibiotic duration per ID.  2D echo without evidence of vegetation.  Active Problems:    Acute urinary retention Continue Foley.  Added Flomax.  Monitor urine output.  cervical and thoracic myelopathy. Likely contributing to his progressive left upper extremity weakness.  Seen by neurosurgery who recommends some form of decompressive surgery once acute issues have resolved.  Left wrist swelling Has some tenderness but no obvious joint tenderness.  X-ray shows soft tissue swelling only.  If symptoms persist and may need synovial aspiration of the joint by primary.     Shoulder pain, bilateral Ordered MRI of the left shoulder by primary.  Left knee effusion Underwent arthroscopy with Gram stain and culture.  Significant leukocytosis suggestive of septic joint.  Continue empiric antibiotic for now.      Code Status  : Full code  Family Communication  : Wife at bedside  Disposition Plan  : Prolonged hospital course.  Per primary.  Barriers For Discharge : Active symptoms    Procedures  : I&D of lumbar abscess, MR cervical, thoracic and lumbar spine.  Renal ultrasound  DVT Prophylaxis  : Subcu heparin  Lab Results  Component Value Date   PLT 124 (L) 06/30/2018    Antibiotics  :  Anti-infectives (From admission, onward)   Start     Dose/Rate Route Frequency Ordered Stop   06/29/18 1936  vancomycin (VANCOCIN) powder  Status:  Discontinued       As needed 06/29/18 1936 06/29/18 2121   06/29/18 1928  polymyxin B 500,000 Units, bacitracin 50,000 Units in sodium chloride 0.9 % 500 mL irrigation  Status:  Discontinued       As needed 06/29/18 1928 06/29/18 2121   06/29/18 1800  vancomycin (VANCOCIN) IVPB 1000 mg/200 mL premix  Status:  Discontinued     1,000 mg 200 mL/hr over 60 Minutes Intravenous  Once 06/29/18 0032 06/29/18 1042   06/29/18 1400  ceFAZolin (ANCEF) IVPB 2g/100 mL premix     2 g 200 mL/hr over 30 Minutes Intravenous Every 8 hours 06/29/18 1042     06/29/18 0200  ceFEPIme (MAXIPIME) 2 g in sodium chloride 0.9 % 100 mL IVPB  Status:  Discontinued     2 g 200 mL/hr over 30 Minutes Intravenous Every 8 hours 06/29/18 0031 06/29/18 1042   06/29/18 0130  vancomycin (VANCOCIN) 2,000 mg in sodium chloride 0.9 % 500 mL IVPB     2,000 mg 250 mL/hr over 120 Minutes Intravenous  Once 06/29/18 0032 06/29/18 0257        Objective:   Vitals:   06/29/18 2221 06/29/18 2242 06/30/18 0044 06/30/18 0334  BP:  117/67 120/70 114/72  Pulse: 96 98 97 88  Resp: 17 18 18 18   Temp:  98.2 F (36.8 C) 98.5 F (36.9 C) 97.7 F (36.5 C)  TempSrc:  Oral Oral Oral  SpO2: 94% 100% 100% 99%  Weight:      Height:        Wt Readings from Last 3 Encounters:  06/28/18 (!) 147.8 kg  02/16/18 (!) 144.2 kg  01/15/18 (!) 144.2 kg     Intake/Output Summary (Last 24 hours) at 06/30/2018 1229 Last  data filed at 06/30/2018 0900 Gross per 24 hour  Intake 5410 ml  Output 985 ml  Net 4425 ml     Physical Exam  Gen: Middle-aged obese male not in distress HEENT: moist mucosa, supple neck Chest: clear b/l, no added sounds CVS: N S1&S2, no murmurs,  GI: soft, NT, ND, BS+, Foley draining clear urine Musculoskeletal: Warm, clean dressing over the back, wound VAC in place,, Ace wrap over left knee CNS: AAOX3, weakness over left upper extremity    Data Review:    CBC Recent Labs  Lab 06/28/18 0002 06/29/18 0310 06/30/18 0452  WBC 13.5* 12.6* 13.7*  HGB 14.2 13.5 12.5*  HCT 44.5 42.6 38.9*  PLT 137* 144* 124*  MCV 89.4 88.4 87.4  MCH 28.5 28.0 28.1  MCHC 31.9 31.7 32.1  RDW 15.6* 15.3 15.6*  LYMPHSABS 0.3*  --   --   MONOABS 0.7  --   --   EOSABS 0.0  --   --   BASOSABS 0.0  --   --     Chemistries  Recent Labs  Lab 06/28/18 0002 06/29/18 0310 06/30/18 0452  NA 141 141 141  K 4.0 3.6 3.5  CL 101 107 108  CO2 27 24 22   GLUCOSE 147* 187* 158*  BUN 26* 42* 58*  CREATININE 1.77* 2.12* 2.47*  CALCIUM 9.0 8.6* 8.4*  AST 47* 59*  --   ALT 21 28  --   ALKPHOS 93 99  --   BILITOT 1.7* 1.6*  --    ------------------------------------------------------------------------------------------------------------------ No results for input(s): CHOL, HDL, LDLCALC, TRIG, CHOLHDL, LDLDIRECT in the last 72 hours.  Lab Results  Component Value Date   HGBA1C 5.1 07/10/2017   ------------------------------------------------------------------------------------------------------------------ No results for input(s): TSH, T4TOTAL, T3FREE, THYROIDAB in the last 72 hours.  Invalid input(s): FREET3 ------------------------------------------------------------------------------------------------------------------ No results for input(s): VITAMINB12, FOLATE, FERRITIN, TIBC, IRON, RETICCTPCT in the last 72 hours.  Coagulation profile Recent Labs  Lab 06/28/18 1410  INR 1.05     No results for input(s): DDIMER in the last 72 hours.  Cardiac Enzymes No results for input(s): CKMB, TROPONINI, MYOGLOBIN in the last 168 hours.  Invalid input(s): CK ------------------------------------------------------------------------------------------------------------------ No results found for: BNP  Inpatient Medications  Scheduled Meds: . acetaminophen  1,000 mg Oral Q6H  . calcium-vitamin D  1 tablet Oral Daily  . docusate sodium  100 mg Oral BID  . DULoxetine  60 mg Oral Daily  . enoxaparin (LOVENOX) injection  30 mg Subcutaneous Q24H  . HYDROcodone-acetaminophen  1 tablet Oral Q6H  . pantoprazole  40 mg Oral BID AC  . sodium chloride flush  3 mL Intravenous Q12H  . tamsulosin  0.4 mg Oral Daily  . vitamin B-12  1,000 mcg Oral Daily   Continuous Infusions: . sodium chloride 125 mL/hr at 06/30/18 0910  . sodium chloride    .  ceFAZolin (ANCEF) IV 2 g (06/30/18 0522)  . methocarbamol (ROBAXIN) IV     PRN Meds:.acetaminophen **OR** acetaminophen, alum & mag hydroxide-simeth, bisacodyl, HYDROcodone-acetaminophen, menthol-cetylpyridinium **OR** phenol, methocarbamol **OR** methocarbamol (ROBAXIN) IV, morphine injection, ondansetron **OR** ondansetron (ZOFRAN) IV, polyethylene glycol, sodium chloride flush, sodium phosphate, zolpidem  Micro Results Recent Results (from the past 240  hour(s))  Culture, Urine     Status: Abnormal   Collection Time: 06/28/18 11:59 AM  Result Value Ref Range Status   Specimen Description URINE, CATHETERIZED  Final   Special Requests   Final    Normal Performed at [facility], [address], [place], [place] [address]    Culture MULTIPLE SPECIES PRESENT, SUGGEST RECOLLECTION (A)  Final   Report Status 06/29/2018 FINAL  Final  Culture, blood (routine x 2)     Status: None (Preliminary result)   Collection Time: 06/28/18  3:30 PM  Result Value Ref Range Status   Specimen Description BLOOD LEFT ANTECUBITAL  Final   Special  Requests   Final    BOTTLES DRAWN AEROBIC AND ANAEROBIC Blood Culture adequate volume   Culture  Setup Time   Final    GRAM POSITIVE COCCI IN BOTH AEROBIC AND ANAEROBIC BOTTLES CRITICAL RESULT CALLED TO, READ BACK BY AND VERIFIED WITH: Evelena Peat PHARMD, AT 0724 06/29/18 BY D. VANHOOK    Culture   Final    NO GROWTH 2 DAYS Performed at [facility], [address] [place], [place] [address]    Report Status PENDING  Incomplete  Culture, blood (routine x 2)     Status: Abnormal (Preliminary result)   Collection Time: 06/28/18  3:35 PM  Result Value Ref Range Status   Specimen Description BLOOD LEFT HAND  Final   Special Requests   Final    BOTTLES DRAWN AEROBIC AND ANAEROBIC Blood Culture adequate volume   Culture  Setup Time   Final    GRAM POSITIVE COCCI IN BOTH AEROBIC AND ANAEROBIC BOTTLES CRITICAL RESULT CALLED TO, READ BACK BY AND VERIFIED WITH: Lieutenant Diego, AT 0724 06/29/18 BY Renato Shin Performed at [facility], [address], [place], [place] [address]    Culture STAPHYLOCOCCUS AUREUS (A)  Final   Report Status PENDING  Incomplete  Blood Culture ID Panel (Reflexed)     Status: Abnormal   Collection Time: 06/28/18  3:35 PM  Result Value Ref Range Status   Enterococcus species NOT DETECTED NOT DETECTED Final   Listeria monocytogenes NOT DETECTED NOT DETECTED Final   Staphylococcus species DETECTED (A) NOT DETECTED Final    Comment: CRITICAL RESULT CALLED TO, READ BACK BY AND VERIFIED WITH: G. ABBOTT PHARMD, AT 9147 06/29/18 BY D. VANHOOK    Staphylococcus aureus DETECTED (A) NOT DETECTED Final    Comment: Methicillin (oxacillin) susceptible Staphylococcus aureus (MSSA). Preferred therapy is anti staphylococcal beta lactam antibiotic (Cefazolin or Nafcillin), unless clinically contraindicated. CRITICAL RESULT CALLED TO, READ BACK BY AND VERIFIED WITH: G. ABBOTT PHARMD, AT 0724 06/29/18 BY D. VANHOOK    Methicillin resistance NOT DETECTED NOT DETECTED Final    Streptococcus species NOT DETECTED NOT DETECTED Final   Streptococcus agalactiae NOT DETECTED NOT DETECTED Final   Streptococcus pneumoniae NOT DETECTED NOT DETECTED Final   Streptococcus pyogenes NOT DETECTED NOT DETECTED Final   Acinetobacter baumannii NOT DETECTED NOT DETECTED Final   Enterobacteriaceae species NOT DETECTED NOT DETECTED Final   Enterobacter cloacae complex NOT DETECTED NOT DETECTED Final   Escherichia coli NOT DETECTED NOT DETECTED Final   Klebsiella oxytoca NOT DETECTED NOT DETECTED Final   Klebsiella pneumoniae NOT DETECTED NOT DETECTED Final   Proteus species NOT DETECTED NOT DETECTED Final   Serratia marcescens NOT DETECTED NOT DETECTED Final   Haemophilus influenzae NOT DETECTED NOT DETECTED Final   Neisseria meningitidis NOT DETECTED NOT DETECTED Final  Pseudomonas aeruginosa NOT DETECTED NOT DETECTED Final   Candida albicans NOT DETECTED NOT DETECTED Final   Candida glabrata NOT DETECTED NOT DETECTED Final   Candida krusei NOT DETECTED NOT DETECTED Final   Candida parapsilosis NOT DETECTED NOT DETECTED Final   Candida tropicalis NOT DETECTED NOT DETECTED Final    Comment: Performed at [facility], [address], [place], [place] [address]  Aerobic/Anaerobic Culture (surgical/deep wound)     Status: None (Preliminary result)   Collection Time: 06/28/18  5:01 PM  Result Value Ref Range Status   Specimen Description ABSCESS LUMBAR  Final   Special Requests NONE  Final   Gram Stain   Final    ABUNDANT WBC PRESENT, PREDOMINANTLY PMN ABUNDANT GRAM POSITIVE COCCI IN PAIRS IN CLUSTERS Performed at [facility], [address] [place], [place] [address]    Culture   Final    MODERATE STAPHYLOCOCCUS AUREUS NO ANAEROBES ISOLATED; CULTURE IN PROGRESS FOR 5 DAYS    Report Status PENDING  Incomplete   Organism ID, Bacteria STAPHYLOCOCCUS AUREUS  Final      Susceptibility   Staphylococcus aureus - MIC*    CIPROFLOXACIN <=0.5 SENSITIVE Sensitive      ERYTHROMYCIN <=0.25 SENSITIVE Sensitive     GENTAMICIN <=0.5 SENSITIVE Sensitive     OXACILLIN <=0.25 SENSITIVE Sensitive     TETRACYCLINE <=1 SENSITIVE Sensitive     VANCOMYCIN <=0.5 SENSITIVE Sensitive     TRIMETH/SULFA <=10 SENSITIVE Sensitive     CLINDAMYCIN <=0.25 SENSITIVE Sensitive     RIFAMPIN <=0.5 SENSITIVE Sensitive     Inducible Clindamycin NEGATIVE Sensitive     * MODERATE STAPHYLOCOCCUS AUREUS  MRSA PCR Screening     Status: None   Collection Time: 06/29/18  4:14 PM  Result Value Ref Range Status   MRSA by PCR NEGATIVE NEGATIVE Final    Comment:        The GeneXpert MRSA Assay (FDA approved for NASAL specimens only), is one component of a comprehensive MRSA colonization surveillance program. It is not intended to diagnose MRSA infection nor to guide or monitor treatment for MRSA infections. Performed at [facility], [address] [place], [place] [address]   Aerobic/Anaerobic Culture (surgical/deep wound)     Status: None (Preliminary result)   Collection Time: 06/29/18  6:08 PM  Result Value Ref Range Status   Specimen Description FLUID LEFT KNEE  Final   Special Requests NONE  Final   Gram Stain   Final    ABUNDANT WBC PRESENT, PREDOMINANTLY PMN RARE GRAM POSITIVE COCCI RESULT CALLED TO, READ BACK BY AND VERIFIED WITH: DR Otelia Sergeant 06/29/18 2012 Performed at [facility], [address], [place], [place] [address]    Culture PENDING  Incomplete   Report Status PENDING  Incomplete    Radiology Reports Dg Chest 2 View  Result Date: 06/28/2018 CLINICAL DATA:  Larey Seat yesterday.  Chest pain. EXAM: CHEST - 2 VIEW COMPARISON:  12/29/2013 FINDINGS: The cardiac silhouette, mediastinal and hilar contours are within normal limits and stable. The lungs are clear. The bony thorax is grossly intact. Stable advanced right-sided glenohumeral joint degenerative changes. IMPRESSION: No acute cardiopulmonary findings. Electronically Signed   By: Rudie Meyer M.D.    On: 06/28/2018 17:30   Dg Cervical Spine With Flex & Extend  Result Date: 06/28/2018 CLINICAL DATA:  Fall yesterday with neck pain. EXAM: CERVICAL SPINE COMPLETE WITH FLEXION AND EXTENSION VIEWS COMPARISON:  None. FINDINGS: Note that the cervical  spine can only be visualized down to the C6 level. Vertebral body alignment and heights are normal. There is mild to moderate spondylosis of the cervical spine. There is mild disc space narrowing at the C3-4, C4-5 and C5-6 levels. Prevertebral soft tissues are normal. No visualized instability on flexion and extension. Atlantoaxial articulation is unremarkable. No acute fracture or subluxation. IMPRESSION: No acute findings. Note that the cervical spine is only visualized down to the C6 level. Mild spondylosis throughout the cervical spinal multilevel disc disease as described. Electronically Signed   By: Elberta Fortis M.D.   On: 06/28/2018 17:31   Dg Wrist 2 Views Left  Result Date: 06/29/2018 CLINICAL DATA:  Wrist pain.  Initial encounter. EXAM: LEFT WRIST - 2 VIEW COMPARISON:  None. FINDINGS: Marked soft tissue swelling is present over the dorsum of the wrist. This appears to be worse on the ulnar side. IMPRESSION: Extensive soft tissue swelling over the dorsum of the wrist without underlying fracture. Electronically Signed   By: Marin Roberts M.D.   On: 06/29/2018 15:56   Mr Cervical Spine Wo Contrast  Result Date: 06/29/2018 CLINICAL DATA:  Neck pain, LEFT biceps weakness. History of spinal cord injury. EXAM: MRI CERVICAL SPINE WITHOUT CONTRAST TECHNIQUE: Multiplanar, multisequence MR imaging of the cervical spine was performed. No intravenous contrast was administered. COMPARISON:  Cervical spine radiograph June 28, 2018 and MRI thoracic spine June 28, 2018 FINDINGS: Mild motion degraded examination. ALIGNMENT: Straightened cervical lordosis.  No malalignment. VERTEBRAE/DISCS: Vertebral bodies are intact. Moderate C3-4 and C4-5 disc  height loss, moderate to severe at C5-6 with proportional subacute on chronic discogenic endplate changes. Diffuse mild disc desiccation. No abnormal or acute bone marrow signal. Congenital canal narrowing on the basis of foreshortened pedicles. CORD:1 cm segment myelomalacia C5-6, faint subcentimeter myelomalacia C4-5. No cord edema or syrinx. POSTERIOR FOSSA, VERTEBRAL ARTERIES, PARASPINAL TISSUES: No MR findings of ligamentous injury. Vertebral artery flow voids present. Included posterior fossa and paraspinal soft tissues are normal. DISC LEVELS: C2-3: Annular bulging and uncovertebral hypertrophy. Mild facet arthropathy. No canal stenosis. Moderate bilateral neural foraminal narrowing. C3-4: Moderate broad-based disc bulge, uncovertebral hypertrophy mild facet arthropathy. Moderate canal stenosis. Severe bilateral neural foraminal narrowing. C4-5: Moderate broad-based disc bulge, uncovertebral hypertrophy and mild facet arthropathy. Moderate canal stenosis. Severe bilateral neural foraminal narrowing. C5-6: Moderate broad-based disc bulge, uncovertebral hypertrophy and mild facet arthropathy. Severe canal stenosis, AP dimension of the canal is 4 mm. Severe bilateral neural foraminal narrowing. C6-7: Small broad-based disc bulge, uncovertebral hypertrophy and mild facet arthropathy. Moderate canal stenosis. Moderate RIGHT and severe LEFT neural foraminal narrowing. C7-T1: No disc bulge, canal stenosis nor neural foraminal narrowing. Mild LEFT facet arthropathy. IMPRESSION: 1. Motion degraded examination. 2. Short segment myelomalacia at C4-5 and C5-6.  No syrinx. 3. Degenerative change of the cervical spine superimposed on congenital canal narrowing. Severe canal stenosis C5-6; moderate canal stenosis C3-4, C4-5 and C6-7. 4. Neural foraminal narrowing C2-3 through C6-7: Severe from C3-4 through C6-7. Electronically Signed   By: Awilda Metro M.D.   On: 06/29/2018 01:03   Mr Thoracic Spine Wo  Contrast  Result Date: 06/28/2018 CLINICAL DATA:  Worsening low back pain radiating to bilateral lower extremity with numbness for several months. RIGHT leg weakness today, bilateral lower extremity paralysis for 2 days. Status post lumbar spine surgery February 2019. EXAM: MRI THORACIC AND LUMBAR SPINE WITHOUT CONTRAST TECHNIQUE: Multiplanar and multiecho pulse sequences of the thoracic and lumbar spine were obtained without intravenous contrast. COMPARISON:  MRI lumbar  spine July 29, 2017 FINDINGS: MRI THORACIC SPINE FINDINGS ALIGNMENT: Maintenance of the thoracic kyphosis. No malalignment. VERTEBRAE/DISCS: Vertebral bodies are intact. Severe disc height loss T3-4 through T 910 with diffuse disc desiccation and moderate subacute discogenic endplate changes. No acute or abnormal bone marrow signal. Borderline congenital canal narrowing on the basis of foreshortened pedicles. CORD: Subcentimeter holo cord T2 bright signal at T7-8. 12 mm segment T2 bright signal spinal cord at T9-10, potential myelomalacia. No syrinx. PREVERTEBRAL AND PARASPINAL SOFT TISSUES: Symmetric moderate to severe paraspinal muscle atrophy. DISC LEVELS: T1-2: No disc bulge. Mild facet arthropathy. No canal stenosis. Mild neural foraminal narrowing. T2-3: Small broad-based disc bulge with superimposed 4 mm LEFT central disc protrusion. Mild facet arthropathy. Moderate canal stenosis. Severe RIGHT and moderate LEFT neural foraminal narrowing. T3-4, T4-5: No disc bulge, canal stenosis nor neural foraminal narrowing. T5-6: Bilateral subarticular disc protrusions measuring to 5 mm on the LEFT. Mild facet arthropathy. Mild canal stenosis. Moderate to severe RIGHT and moderate LEFT neural foraminal narrowing. T6-7: Small broad-based disc bulge minimal facet arthropathy. No canal stenosis. Moderate RIGHT neural foraminal narrowing. T7-8: 3 mm broad-based disc bulge. Mild facet arthropathy. Mild canal stenosis. Mild neural foraminal narrowing.  T8-9: Small RIGHT subarticular disc protrusion. No canal stenosis. Moderate RIGHT and mild LEFT neural foraminal narrowing. T9-10: 5 mm broad-based disc bulge, moderate facet arthropathy. Moderate canal stenosis. Severe bilateral neural foraminal narrowing. T10-11: 3 mm broad-based disc bulge. Severe facet arthropathy. Moderate canal stenosis. Severe bilateral neural foraminal narrowing. T11-12: Annular bulging. Mild facet arthropathy. No canal stenosis or neural foraminal narrowing. T12-L1: No disc bulge. Moderate facet arthropathy without canal stenosis. Mild LEFT neural foraminal narrowing. MRI LUMBAR SPINE FINDINGS-large body habitus results in decreased signal to noise ratio, mild motion degraded examination. SEGMENTATION: For the purposes of this report, the last well-formed intervertebral disc is reported as L5-S1. ALIGNMENT: Maintained lumbar lordosis. Similar minimal grade 1 L5-S1 anterolisthesis. Grade 1 L2-3 retrolisthesis. VERTEBRAE:Vertebral bodies are intact. Intervertebral discs demonstrate normal morphology, mild desiccation and superimposed mild disc edema lower lumbar discs. Moderate subacute discogenic endplate changes L5-S1. Multilevel mild subacute to chronic discogenic endplate changes. No suspicious bone marrow signal. CONUS MEDULLARIS AND CAUDA EQUINA: Conus medullaris terminates at L1-2 and demonstrates normal signal. Cauda equina not discretely identified. PARASPINAL AND OTHER SOFT TISSUES: Severe paraspinal muscle denervation/atrophy. Asymmetric LEFT distal iliopsoas muscle atrophy. 2.9 x 4.3 x 6.1 cm central paraspinal fluid collection L4 through S1 along surgical approach. DISC LEVELS: L1-2: Small broad-based disc bulge. Moderate RIGHT and moderate to severe LEFT facet arthropathy. No canal stenosis. Mild LEFT neural foraminal narrowing. L2-3: Retrolisthesis. Small broad-based disc bulge. Status post LEFT hemilaminectomy. Moderate facet arthropathy without canal stenosis. Moderate  bilateral neural foraminal narrowing. L3-4: No disc bulge. LEFT and possible RIGHT laminectomies. Severe facet arthropathy. No canal stenosis. Mild neural foraminal narrowing. L4-5: LEFT and possibly RIGHT laminectomies. Severe facet arthropathy with RIGHT facet effusion contiguous with paraspinal fluid collection. RIGHT facet is widened to 3 mm. 5 mm central disc protrusion versus extrusion. Moderate to severe canal stenosis predominately due to fluid collection suspected though, this may be overestimated. Severe bilateral neural foraminal narrowing. L5-S1: Anterolisthesis. Annular bulging. Severe facet arthropathy. Moderate canal stenosis. Moderate to severe bilateral neural foraminal narrowing. IMPRESSION: MR THORACIC SPINE IMPRESSION 1. No fracture, malalignment or acute osseous process. 2. Subcentimeter cord edema T7-8, less likely myelomalacia. 12 mm segment suspected myelomalacia T9-10. No syrinx. 3. Degenerative change of the lumbar spine resulting in moderate canal stenosis T9-10 and T10-11.  Multilevel mild canal stenosis. 4. Multilevel neural foraminal narrowing: Severe at T2-3, T9-10 and T10-11. 5. Moderate to severe paraspinal muscle atrophy. MR LUMBAR SPINE IMPRESSION 1. Habitus and motion degraded examination. Similar grade 1 L2-3 retrolisthesis and grade 1 L4-5 anterolisthesis. No fracture. 2. L2-3 through L4-5 laminectomies. 2.9 x 4.3 x 6.1 cm fluid collection along surgical approach contiguous with RIGHT L4-5 facet effusion, septic arthropathy possible. Consider sampling. 3. Moderate to severe suspected canal stenosis L4-5 impart due to fluid collection, potentially overestimated. Moderate canal stenosis L5-S1. 4. Neural foraminal narrowing all lumbar levels: Severe at L4-5. 5. Severe paraspinal muscle atrophy, early LEFT psoas muscle atrophy. Electronically Signed   By: Awilda Metro M.D.   On: 06/28/2018 03:10   Mr Lumbar Spine Wo Contrast  Result Date: 06/28/2018 CLINICAL DATA:  Worsening  low back pain radiating to bilateral lower extremity with numbness for several months. RIGHT leg weakness today, bilateral lower extremity paralysis for 2 days. Status post lumbar spine surgery February 2019. EXAM: MRI THORACIC AND LUMBAR SPINE WITHOUT CONTRAST TECHNIQUE: Multiplanar and multiecho pulse sequences of the thoracic and lumbar spine were obtained without intravenous contrast. COMPARISON:  MRI lumbar spine July 29, 2017 FINDINGS: MRI THORACIC SPINE FINDINGS ALIGNMENT: Maintenance of the thoracic kyphosis. No malalignment. VERTEBRAE/DISCS: Vertebral bodies are intact. Severe disc height loss T3-4 through T 910 with diffuse disc desiccation and moderate subacute discogenic endplate changes. No acute or abnormal bone marrow signal. Borderline congenital canal narrowing on the basis of foreshortened pedicles. CORD: Subcentimeter holo cord T2 bright signal at T7-8. 12 mm segment T2 bright signal spinal cord at T9-10, potential myelomalacia. No syrinx. PREVERTEBRAL AND PARASPINAL SOFT TISSUES: Symmetric moderate to severe paraspinal muscle atrophy. DISC LEVELS: T1-2: No disc bulge. Mild facet arthropathy. No canal stenosis. Mild neural foraminal narrowing. T2-3: Small broad-based disc bulge with superimposed 4 mm LEFT central disc protrusion. Mild facet arthropathy. Moderate canal stenosis. Severe RIGHT and moderate LEFT neural foraminal narrowing. T3-4, T4-5: No disc bulge, canal stenosis nor neural foraminal narrowing. T5-6: Bilateral subarticular disc protrusions measuring to 5 mm on the LEFT. Mild facet arthropathy. Mild canal stenosis. Moderate to severe RIGHT and moderate LEFT neural foraminal narrowing. T6-7: Small broad-based disc bulge minimal facet arthropathy. No canal stenosis. Moderate RIGHT neural foraminal narrowing. T7-8: 3 mm broad-based disc bulge. Mild facet arthropathy. Mild canal stenosis. Mild neural foraminal narrowing. T8-9: Small RIGHT subarticular disc protrusion. No canal  stenosis. Moderate RIGHT and mild LEFT neural foraminal narrowing. T9-10: 5 mm broad-based disc bulge, moderate facet arthropathy. Moderate canal stenosis. Severe bilateral neural foraminal narrowing. T10-11: 3 mm broad-based disc bulge. Severe facet arthropathy. Moderate canal stenosis. Severe bilateral neural foraminal narrowing. T11-12: Annular bulging. Mild facet arthropathy. No canal stenosis or neural foraminal narrowing. T12-L1: No disc bulge. Moderate facet arthropathy without canal stenosis. Mild LEFT neural foraminal narrowing. MRI LUMBAR SPINE FINDINGS-large body habitus results in decreased signal to noise ratio, mild motion degraded examination. SEGMENTATION: For the purposes of this report, the last well-formed intervertebral disc is reported as L5-S1. ALIGNMENT: Maintained lumbar lordosis. Similar minimal grade 1 L5-S1 anterolisthesis. Grade 1 L2-3 retrolisthesis. VERTEBRAE:Vertebral bodies are intact. Intervertebral discs demonstrate normal morphology, mild desiccation and superimposed mild disc edema lower lumbar discs. Moderate subacute discogenic endplate changes L5-S1. Multilevel mild subacute to chronic discogenic endplate changes. No suspicious bone marrow signal. CONUS MEDULLARIS AND CAUDA EQUINA: Conus medullaris terminates at L1-2 and demonstrates normal signal. Cauda equina not discretely identified. PARASPINAL AND OTHER SOFT TISSUES: Severe paraspinal muscle denervation/atrophy. Asymmetric  LEFT distal iliopsoas muscle atrophy. 2.9 x 4.3 x 6.1 cm central paraspinal fluid collection L4 through S1 along surgical approach. DISC LEVELS: L1-2: Small broad-based disc bulge. Moderate RIGHT and moderate to severe LEFT facet arthropathy. No canal stenosis. Mild LEFT neural foraminal narrowing. L2-3: Retrolisthesis. Small broad-based disc bulge. Status post LEFT hemilaminectomy. Moderate facet arthropathy without canal stenosis. Moderate bilateral neural foraminal narrowing. L3-4: No disc bulge. LEFT  and possible RIGHT laminectomies. Severe facet arthropathy. No canal stenosis. Mild neural foraminal narrowing. L4-5: LEFT and possibly RIGHT laminectomies. Severe facet arthropathy with RIGHT facet effusion contiguous with paraspinal fluid collection. RIGHT facet is widened to 3 mm. 5 mm central disc protrusion versus extrusion. Moderate to severe canal stenosis predominately due to fluid collection suspected though, this may be overestimated. Severe bilateral neural foraminal narrowing. L5-S1: Anterolisthesis. Annular bulging. Severe facet arthropathy. Moderate canal stenosis. Moderate to severe bilateral neural foraminal narrowing. IMPRESSION: MR THORACIC SPINE IMPRESSION 1. No fracture, malalignment or acute osseous process. 2. Subcentimeter cord edema T7-8, less likely myelomalacia. 12 mm segment suspected myelomalacia T9-10. No syrinx. 3. Degenerative change of the lumbar spine resulting in moderate canal stenosis T9-10 and T10-11. Multilevel mild canal stenosis. 4. Multilevel neural foraminal narrowing: Severe at T2-3, T9-10 and T10-11. 5. Moderate to severe paraspinal muscle atrophy. MR LUMBAR SPINE IMPRESSION 1. Habitus and motion degraded examination. Similar grade 1 L2-3 retrolisthesis and grade 1 L4-5 anterolisthesis. No fracture. 2. L2-3 through L4-5 laminectomies. 2.9 x 4.3 x 6.1 cm fluid collection along surgical approach contiguous with RIGHT L4-5 facet effusion, septic arthropathy possible. Consider sampling. 3. Moderate to severe suspected canal stenosis L4-5 impart due to fluid collection, potentially overestimated. Moderate canal stenosis L5-S1. 4. Neural foraminal narrowing all lumbar levels: Severe at L4-5. 5. Severe paraspinal muscle atrophy, early LEFT psoas muscle atrophy. Electronically Signed   By: Awilda Metroourtnay  Bloomer M.D.   On: 06/28/2018 03:10   [place] Renal  Result Date: 06/30/2018 CLINICAL DATA:  Acute tubular necrosis, hypertension, obesity, chronic kidney disease EXAM: RENAL / URINARY  TRACT ULTRASOUND COMPLETE COMPARISON:  CT abdomen pelvis of 03/21/2016 FINDINGS: Right Kidney: Length: 13.6 cm. No hydronephrosis is seen. The renal parenchyma appears normal. There is a nonobstructing calculus in the lower right kidney measuring 10 x 6 mm. Left Kidney: Length: 14.2 cm. No hydronephrosis is seen. There are small hypoechoic structures in the mid and lower lateral left kidney consistent with cysts of 2.7 and 1.3 cm. Bladder: The urinary bladder is decompressed with Foley catheter present. IMPRESSION: 1. No hydronephrosis. 2. Nonobstructing lower pole right renal calculus of 10 x 6 mm. 3. Small hypoechoic structures consistent with cysts on the left. No left renal calculi are seen. Electronically Signed   By: Dwyane DeePaul  Barry M.D.   On: 06/30/2018 10:47   Ct Aspiration  Result Date: 06/28/2018 INDICATION: History of spinal surgery in February 2019, now with bilateral lower extremity paralysis for the past 2 days. Lumbar spine MRI demonstrated an indeterminate fluid collection posterior to the L4-L5 vertebral bodies. Request made for image guided fluid aspiration for Gram stain and culture analysis. EXAM: CT GUIDANCE NEEDLE PLACEMENT COMPARISON:  Lumbar spine MRI - 06/28/2018 MEDICATIONS: The patient is currently admitted to the hospital and receiving intravenous antibiotics. The antibiotics were administered within an appropriate time frame prior to the initiation of the procedure. ANESTHESIA/SEDATION: Moderate (conscious) sedation was employed during this procedure. A total of Fentanyl 150 mcg was administered intravenously. Moderate Sedation Time: 15 minutes. The patient's level of consciousness and vital signs  were monitored continuously by radiology nursing throughout the procedure under my direct supervision. CONTRAST:  None COMPLICATIONS: None immediate. PROCEDURE: Informed written consent was obtained from the patient after a discussion of the risks, benefits and alternatives to treatment. The  patient was placed prone on the CT gantry and a pre procedural CT was performed re-demonstrating the known abscess/fluid collection within the soft tissues posterior to the L4-L5 vertebral body with dominant air in fluid containing collection measuring approximately 5.9 x 2.7 cm (image 37, series 2). The procedure was planned. A timeout was performed prior to the initiation of the procedure. The skin overlying the midline of the lower back was prepped and draped in the usual sterile fashion. The overlying soft tissues were anesthetized with 1% lidocaine with epinephrine. Appropriate trajectory was planned with the use of a 22 gauge spinal needle. An 18 gauge trocar needle was advanced into the abscess/fluid collection. Appropriate positioning was confirmed with a limited CT scan. Approximately 7 cc of bloody fluid was aspirated as the needle was slowly retracted. All aspirated fluid was capped and sent to the laboratory for analysis. A dressing was placed. The patient tolerated the procedure well without immediate post procedural complication. IMPRESSION: Successful CT guided aspiration of approximately 7 cc of bloody fluid from dominant fluid collection posterior to the L4 and L5 vertebral bodies. Samples were sent to the laboratory as requested by the ordering clinical team. Electronically Signed   By: Simonne Come M.D.   On: 06/28/2018 18:12   Dg Shoulder Left  Result Date: 06/28/2018 CLINICAL DATA:  Fall yesterday with left shoulder pain. EXAM: LEFT SHOULDER - 2+ VIEW COMPARISON:  None. FINDINGS: Mild degenerate change of the [facility] joint and glenohumeral joints. No acute fracture or dislocation. IMPRESSION: No acute findings. Electronically Signed   By: Elberta Fortis M.D.   On: 06/28/2018 17:34   Dg Knee Complete 4 Views Left  Result Date: 06/28/2018 CLINICAL DATA:  Fall yesterday with left knee pain. EXAM: LEFT KNEE - COMPLETE 4+ VIEW COMPARISON:  None. FINDINGS: Moderate degenerative change of the lateral  compartment with moderate to severe degenerative changes of the medial compartment and patellofemoral joints. No acute fracture or dislocation. Joint effusion is present. IMPRESSION: No acute fracture. Moderate to severe osteoarthritic change.  Joint effusion. Electronically Signed   By: Elberta Fortis M.D.   On: 06/28/2018 17:33   Dg Foot Complete Left  Result Date: 06/30/2018 CLINICAL DATA:  Left great toe ulcer. EXAM: LEFT FOOT - COMPLETE 3+ VIEW COMPARISON:  No recent. FINDINGS: No acute soft tissue bony abnormality identified. Degenerative changes noted about the left foot, most prominent about the first MTP joint. No radiopaque foreign body. IMPRESSION: No acute abnormality identified. Specifically no erosive abnormalities are identified. Diffuse degenerative change, most prominent the first MTP joint. Electronically Signed   By: Maisie Fus  Register   On: 06/30/2018 11:15    Time Spent in minutes  25   Aesha Agrawal M.D on 06/30/2018 at 12:29 PM  Between 7am to 7pm - Pager - [phone]  After 7pm go to [email or web address] - password [facility]  [facility] -  Office  [phone]

## 2018-06-30 NOTE — Progress Notes (Signed)
PHARMACY - PHYSICIAN COMMUNICATION CRITICAL VALUE ALERT - BLOOD CULTURE IDENTIFICATION (BCID)  [name] [name] is an 62 y.o. male [facility]  Assessment: 62 yo M found to have MSSA bacteremia. Blood cx is now showing bacillus species. Unsure of significance but cefazolin should be able to cover it. We could ask for susceptibilities if needed.  Name of physician (or Provider) Contacted: Jolayne HainesJ. Hatcher  Current antibiotics: Cefazolin  Changes to prescribed antibiotics recommended:  Continue cefazolin No changes needed currently  Results for orders placed or performed during the hospital encounter of 06/27/18  Blood Culture ID Panel (Reflexed) (Collected: 06/28/2018  3:35 PM)  Result Value Ref Range   Enterococcus species NOT DETECTED NOT DETECTED   Listeria monocytogenes NOT DETECTED NOT DETECTED   Staphylococcus species DETECTED (A) NOT DETECTED   Staphylococcus aureus DETECTED (A) NOT DETECTED   Methicillin resistance NOT DETECTED NOT DETECTED   Streptococcus species NOT DETECTED NOT DETECTED   Streptococcus agalactiae NOT DETECTED NOT DETECTED   Streptococcus pneumoniae NOT DETECTED NOT DETECTED   Streptococcus pyogenes NOT DETECTED NOT DETECTED   Acinetobacter baumannii NOT DETECTED NOT DETECTED   Enterobacteriaceae species NOT DETECTED NOT DETECTED   Enterobacter cloacae complex NOT DETECTED NOT DETECTED   Escherichia coli NOT DETECTED NOT DETECTED   Klebsiella oxytoca NOT DETECTED NOT DETECTED   Klebsiella pneumoniae NOT DETECTED NOT DETECTED   Proteus species NOT DETECTED NOT DETECTED   Serratia marcescens NOT DETECTED NOT DETECTED   Haemophilus influenzae NOT DETECTED NOT DETECTED   Neisseria meningitidis NOT DETECTED NOT DETECTED   Pseudomonas aeruginosa NOT DETECTED NOT DETECTED   Candida albicans NOT DETECTED NOT DETECTED   Candida glabrata NOT DETECTED NOT DETECTED   Candida krusei NOT DETECTED NOT DETECTED   Candida parapsilosis NOT DETECTED  NOT DETECTED   Candida tropicalis NOT DETECTED NOT DETECTED    Armandina StammerBATCHELDER,Shevonne Wolf J 06/30/2018  3:19 PM

## 2018-06-30 NOTE — Progress Notes (Signed)
Subjective: Patient doing well this morning.  Good pain control from his surgery.  He is complaining of pain in the left shoulder which was the result of his fall.   Objective: Vital signs in last 24 hours: Temp:  [97.7 F (36.5 C)-98.5 F (36.9 C)] 97.7 F (36.5 C) (09/17 0334) Pulse Rate:  [77-98] 88 (09/17 0334) Resp:  [14-18] 18 (09/17 0334) BP: (99-144)/(56-90) 114/72 (09/17 0334) SpO2:  [93 %-100 %] 99 % (09/17 0334)  Intake/Output from previous day: 09/16 0701 - 09/17 0700 In: 5410 [I.V.:5183.3; IV Piggyback:226.7] Out: 1785 [Urine:1375; Drains:360; Blood:50] Intake/Output this shift: No intake/output data recorded.  Recent Labs    06/28/18 0002 06/29/18 0310 06/30/18 0452  HGB 14.2 13.5 12.5*   Recent Labs    06/29/18 0310 06/30/18 0452  WBC 12.6* 13.7*  RBC 4.82 4.45  HCT 42.6 38.9*  PLT 144* 124*   Recent Labs    06/29/18 0310 06/30/18 0452  NA 141 141  K 3.6 3.5  CL 107 108  CO2 24 22  BUN 42* 58*  CREATININE 2.12* 2.47*  GLUCOSE 187* 158*  CALCIUM 8.6* 8.4*   Recent Labs    06/28/18 1410  INR 1.05    Exam Very pleasant male alert and oriented in no acute distress.  Wound VAC intact.  Left knee dressing and Hemovac intact.      Assessment/Plan: We are awaiting culture and sensitivities.  We will see about getting infectious disease to do a consult.  Patient may need MRI of the left shoulder to rule out rotator cuff tear at some point.  Previous shoulder x-rays that June 28, 2018 was negative for fracture.   Zonia KiefJames Owens 06/30/2018, 9:04 AM

## 2018-06-30 NOTE — H&P (View-Only) (Signed)
    [facility] has been requested to perform a transesophageal echocardiogram on [name] [name] for bacteremia.  After careful review of history and examination, the risks and benefits of transesophageal echocardiogram have been explained including risks of esophageal damage, perforation (1:10,000 risk), bleeding, pharyngeal hematoma as well as other potential complications associated with conscious sedation including aspiration, arrhythmia, respiratory failure and death. Alternatives to treatment were discussed, questions were answered. Patient is willing to proceed.   [name] has severe cervical stenosis but has normal ROM.  He has infection in the lumbar spine but no evidence of infection in the cervical spine.   The TEE is scheduled for 07/01/18 at 12:00 with Dr. McLean.   Aliya Sol, NP  06/30/2018 4:09 PM   

## 2018-07-01 ENCOUNTER — Inpatient Hospital Stay (HOSPITAL_COMMUNITY): Payer: No Typology Code available for payment source

## 2018-07-01 ENCOUNTER — Encounter (HOSPITAL_COMMUNITY)
Admission: EM | Disposition: A | Discharge status: Rehabilitation Facility | Payer: Self-pay | Source: Home / Self Care | Attending: Specialist

## 2018-07-01 ENCOUNTER — Encounter (HOSPITAL_COMMUNITY): Payer: Self-pay | Admitting: *Deleted

## 2018-07-01 ENCOUNTER — Ambulatory Visit (INDEPENDENT_AMBULATORY_CARE_PROVIDER_SITE_OTHER): Payer: Self-pay | Admitting: Physical Medicine and Rehabilitation

## 2018-07-01 DIAGNOSIS — M75102 Unspecified rotator cuff tear or rupture of left shoulder, not specified as traumatic: Secondary | ICD-10-CM

## 2018-07-01 DIAGNOSIS — R7881 Bacteremia: Secondary | ICD-10-CM

## 2018-07-01 HISTORY — PX: TEE WITHOUT CARDIOVERSION: SHX5443

## 2018-07-01 LAB — BASIC METABOLIC PANEL
Anion gap: 12 (ref 5–15)
BUN: 79 mg/dL — ABNORMAL HIGH (ref 6–20)
CHLORIDE: 104 mmol/L (ref 98–111)
CO2: 21 mmol/L — ABNORMAL LOW (ref 22–32)
CREATININE: 2.99 mg/dL — AB (ref 0.61–1.24)
Calcium: 8.1 mg/dL — ABNORMAL LOW (ref 8.9–10.3)
GFR, EST AFRICAN AMERICAN: 25 mL/min — AB (ref >60)
GFR, EST NON AFRICAN AMERICAN: 21 mL/min — AB (ref >60)
Glucose, Bld: 138 mg/dL — ABNORMAL HIGH (ref 70–99)
POTASSIUM: 3.8 mmol/L (ref 3.5–5.1)
SODIUM: 137 mmol/L (ref 135–145)

## 2018-07-01 LAB — PATHOLOGIST SMEAR REVIEW

## 2018-07-01 LAB — CULTURE, BLOOD (ROUTINE X 2)
SPECIAL REQUESTS: ADEQUATE
Special Requests: ADEQUATE

## 2018-07-01 LAB — SODIUM, URINE, RANDOM: Sodium, Ur: 11 mmol/L

## 2018-07-01 LAB — CK: CK TOTAL: 89 U/L (ref 49–397)

## 2018-07-01 LAB — CREATININE, URINE, RANDOM: CREATININE, URINE: 110.06 mg/dL

## 2018-07-01 SURGERY — ECHOCARDIOGRAM, TRANSESOPHAGEAL
Anesthesia: Moderate Sedation

## 2018-07-01 MED ORDER — BISACODYL 10 MG RE SUPP
10.0000 mg | Freq: Every day | RECTAL | Status: AC
  Administered 2018-07-01 – 2018-07-02 (×2): 10 mg via RECTAL
  Filled 2018-07-01 (×2): qty 1

## 2018-07-01 MED ORDER — FENTANYL CITRATE (PF) 100 MCG/2ML IJ SOLN
INTRAMUSCULAR | Status: DC | PRN
  Administered 2018-07-01 (×2): 25 ug via INTRAVENOUS

## 2018-07-01 MED ORDER — SODIUM CHLORIDE 0.9 % IV BOLUS
1000.0000 mL | Freq: Once | INTRAVENOUS | Status: AC
  Administered 2018-07-01: 1000 mL via INTRAVENOUS

## 2018-07-01 MED ORDER — BUTAMBEN-TETRACAINE-BENZOCAINE 2-2-14 % EX AERO
INHALATION_SPRAY | CUTANEOUS | Status: DC | PRN
  Administered 2018-07-01: 2 via TOPICAL

## 2018-07-01 MED ORDER — SODIUM CHLORIDE 0.9 % IV SOLN: INTRAVENOUS | Status: DC

## 2018-07-01 MED ORDER — MIDAZOLAM HCL 10 MG/2ML IJ SOLN
INTRAMUSCULAR | Status: DC | PRN
  Administered 2018-07-01 (×2): 1 mg via INTRAVENOUS
  Administered 2018-07-01 (×2): 2 mg via INTRAVENOUS

## 2018-07-01 MED ORDER — FENTANYL CITRATE (PF) 100 MCG/2ML IJ SOLN
INTRAMUSCULAR | Status: AC
  Filled 2018-07-01: qty 2

## 2018-07-01 MED ORDER — MIDAZOLAM HCL 5 MG/ML IJ SOLN
INTRAMUSCULAR | Status: AC
  Filled 2018-07-01: qty 2

## 2018-07-01 NOTE — Progress Notes (Signed)
OT Cancellation Note  Patient Details Name: [name] [name] MRN: [id number] DOB: 1956/02/28   Cancelled Treatment:    Reason Eval/Treat Not Completed: Patient at procedure or test/ unavailable  Will check on later on next day as schedule allows  Lise AuerLori Odas Ozer, OT Acute Rehabilitation Services [phone]- [phone] Office- [phone]     Joslin Doell, Karin GoldenLorraine D 07/01/2018, 1:57 PM

## 2018-07-01 NOTE — Interval H&P Note (Signed)
History and Physical Interval Note:  07/01/2018 12:14 PM  [name] [name]  has presented today for surgery, with the diagnosis of BACTEREMIA  The various methods of treatment have been discussed with the patient and family. After consideration of risks, benefits and other options for treatment, the patient has consented to  Procedure(s): TRANSESOPHAGEAL ECHOCARDIOGRAM (TEE) (N/A) as a surgical intervention .  The patient's history has been reviewed, patient examined, no change in status, stable for surgery.  I have reviewed the patient's chart and labs.  Questions were answered to the patient's satisfaction.     Dalton Chesapeake EnergyMcLean

## 2018-07-01 NOTE — Plan of Care (Signed)
Problem: Education: Goal: Knowledge of General Education information will improve Description Including pain rating scale, medication(s)/side effects and non-pharmacologic comfort measures Outcome: Progressing   Problem: Health Behavior/Discharge Planning: Goal: Ability to manage health-related needs will improve Outcome: Progressing   Problem: Clinical Measurements: Goal: Respiratory complications will improve Outcome: Progressing   Problem: Nutrition: Goal: Adequate nutrition will be maintained Outcome: Progressing   Problem: Coping: Goal: Level of anxiety will decrease Outcome: Progressing   Problem: Elimination: Goal: Will not experience complications related to urinary retention Outcome: Progressing   Problem: Safety: Goal: Ability to remain free from injury will improve Outcome: Progressing

## 2018-07-01 NOTE — Progress Notes (Signed)
PT Cancellation Note  Patient Details Name: Scot Junrnest Barner MRN: [id number] DOB: [birth year]   Cancelled Treatment:    Reason Eval/Treat Not Completed: Patient at procedure or test/unavailable. Pt off unit for testing. Will continue to follow and initiate PT eval when able.   Marylynn PearsonLaura D Delyla Sandeen 07/01/2018, 11:17 AM   Conni SlipperLaura Jerrye Seebeck, PT, DPT Acute Rehabilitation Services Pager: [phone] Office: [phone]

## 2018-07-01 NOTE — Consult Note (Signed)
Reason for Consult: AKI/CKD Referring Physician:  Erlinda Hong, MD  [name] [name] is an 62 y.o. male.  HPI: [name] [name] is a 62 yo AAM with PMH significant for HTN, DM, obesity, nephrolithiasis, OSA (not on CPAP), and significant, multilevel cervical and lumbar spinal stenosis s/p multiple procedures in the past (lumbar laminectomy in 2011 and redo in February 2019) who presented on 06/27/18 with bilateral leg weakness that progressed to flaccid paralysis of both legs.  Workup revealed a fluid collection at L4-L5.  This was found to be a lumbar abscess with aspiration and blood cultures positive for MSSA.  He underwent lumbar wound debridement/drainage/irrigation on 06/29/18 as well as aspiration of left knee which was consistent with septic arthritis.  TEE was performed today and was negative for vegetations.  He also had some acute urinary retention which was treated with foley catheter placement.  We were consulted due to the development of AKI during his hospitalization despite IVF's.  His trend in Scr is seen below.  He admits to taking NSAIDs regularly for his back pain prior to admission and also received a dose of Toradol upon admission as well as 2 grams of vancomycin and 2 doses of maxipime.  Renal US showed non-obstructing stones and no hydro with foley in bladder.  He denies any previous history of CKD, however he had a previous episode of AKI in March 2015 when he had obstructive nephrolithiasis and sepsis.  His creatinine returned to normal after that admission but started to rise again in February 2019.    He denies any family history of CKD, no dysuria, pyuria, hematuria, urgency, frequency but did have retention upon presentation.  He has noted some "foamy" urine when he drinks beer and has had documented proteinuria since 2011.  Trend in Creatinine: Creatinine, Ser  Date/Time Value Ref Range Status  07/01/2018 05:01 AM 2.99 (H) 0.61 - 1.24 mg/dL Final  06/30/2018 04:52 AM 2.47 (H) 0.61 - 1.24 mg/dL  Final  06/29/2018 03:10 AM 2.12 (H) 0.61 - 1.24 mg/dL Final  06/28/2018 12:02 AM 1.77 (H) 0.61 - 1.24 mg/dL Final  11/18/2017 06:09 AM 1.33 (H) 0.61 - 1.24 mg/dL Final  11/13/2017 03:12 PM 1.02 0.61 - 1.24 mg/dL Final  03/23/2016 05:28 AM 0.94 0.61 - 1.24 mg/dL Final  03/22/2016 05:15 AM 1.05 0.61 - 1.24 mg/dL Final  03/21/2016 11:50 AM 0.92 0.61 - 1.24 mg/dL Final  03/21/2016 08:30 AM 0.88 0.61 - 1.24 mg/dL Final  01/05/2014 11:45 AM 0.96 0.50 - 1.35 mg/dL Final  12/30/2013 04:53 AM 1.94 (H) 0.50 - 1.35 mg/dL Final  12/29/2013 05:15 AM 2.82 (H) 0.50 - 1.35 mg/dL Final  12/28/2013 03:35 AM 3.41 (H) 0.50 - 1.35 mg/dL Final  12/27/2013 03:52 PM 2.72 (H) 0.50 - 1.35 mg/dL Final  03/26/2013 09:07 AM 0.86 0.50 - 1.35 mg/dL Final  01/21/2013 01:10 PM 0.77 0.50 - 1.35 mg/dL Final  12/08/2012 04:10 AM 0.82 0.50 - 1.35 mg/dL Final  12/02/2012 11:45 AM 0.83 0.50 - 1.35 mg/dL Final  05/01/2012 11:53 AM 0.98 0.50 - 1.35 mg/dL Final  01/27/2010 06:35 AM 1.06 0.4 - 1.5 mg/dL Final  01/25/2010 10:15 AM 0.93 0.4 - 1.5 mg/dL Final  01/24/2010 03:54 AM 0.87 0.4 - 1.5 mg/dL Final  01/17/2010 11:46 AM 0.68 0.4 - 1.5 mg/dL Final  02/07/2009 08:30 AM 0.79 0.4 - 1.5 mg/dL Final  08/20/2008 04:10 AM 0.88 0.4 - 1.5 mg/dL Final  08/16/2008 05:35 AM 0.80 0.4 - 1.5 mg/dL Final  08/15/2008 04:29 AM 0.87 0.4 -  1.5 mg/dL Final  08/11/2008 05:05 AM 1.06 0.4 - 1.5 mg/dL Final  08/10/2008 10:00 AM 0.99 0.4 - 1.5 mg/dL Final  03/11/2008 07:00 AM 0.89  Final  03/10/2008 03:35 AM 0.92  Final  03/08/2008 03:55 AM 0.99  Final  03/07/2008 08:18 AM 1.0  Final    PMH:   Past Medical History:  Diagnosis Date  . Anemia   . Arthritis   . Chronic kidney disease    HX acute kidney failure / acute pyelonephritis / hydronephrosis / severe sepsis per discharge summary 12/27/13  . History of kidney stones   . Hypertension   . Morbid obesity ([place])   . Obstructive sleep apnea    does not need c pap since 110 lb wt loss  .  Osteoporosis   . Prurigo 2002  . Scars    ON ARMS FROM CHEMICAL EXPLOSION 1999  . Spinal stenosis     PSH:   Past Surgical History:  Procedure Laterality Date  . BACK SURGERY  01/23/2010   lumbar  . CHOLECYSTECTOMY N/A 03/24/2016   Procedure: LAPAROSCOPIC CHOLECYSTECTOMY WITH INTRAOPERATIVE CHOLANGIOGRAM;  Surgeon: Autumn Messing III, MD;  Location: [facility];  Service: General;  Laterality: N/A;  . CIRCUMCISION    . CYSTOSCOPY WITH RETROGRADE PYELOGRAM, URETEROSCOPY AND STENT PLACEMENT Left 01/20/2014   Procedure: CYSTOSCOPY WITH RETROGRADE PYELOGRAM, URETEROSCOPY AND STENT EXCHANGE;  Surgeon: Molli Hazard, MD;  Location: [facility];  Service: Urology;  Laterality: Left;  bugbee bladder fulguration  . CYSTOSCOPY WITH STENT PLACEMENT Left 12/28/2013   Procedure: CYSTOSCOPY WITH STENT PLACEMENT left retrograde pyleogram;  Surgeon: Molli Hazard, MD;  Location: [facility];  Service: Urology;  Laterality: Left;  . ESOPHAGOGASTRODUODENOSCOPY N/A 12/07/2012   Procedure: ESOPHAGOGASTRODUODENOSCOPY (EGD);  Surgeon: Madilyn Hook, DO;  Location: [facility];  Service: General;  Laterality: N/A;  . HOLMIUM LASER APPLICATION Left 0/10/7508   Procedure: HOLMIUM LASER APPLICATION;  Surgeon: Molli Hazard, MD;  Location: [facility];  Service: Urology;  Laterality: Left;  . KNEE ARTHROSCOPY Left 06/29/2018   Procedure: ARTHROSCOPY KNEE;  Surgeon: Jessy Oto, MD;  Location: [place];  Service: Orthopedics;  Laterality: Left;  . LAPAROSCOPIC GASTRIC SLEEVE RESECTION N/A 12/07/2012   Procedure: LAPAROSCOPIC GASTRIC SLEEVE RESECTION;  Surgeon: Madilyn Hook, DO;  Location: [facility];  Service: General;  Laterality: N/A;  laparoscopic sleeve gastrectomy with EGD  . LUMBAR LAMINECTOMY N/A 11/17/2017   Procedure: L2-3 LAMINECTOMY AND REDO LAMINECTOMIES  L3-4, L4-5 AND L5-S1;  Surgeon: Jessy Oto, MD;  Location: [place];  Service: Orthopedics;  Laterality: N/A;  . LUMBAR WOUND DEBRIDEMENT N/A 06/29/2018   Procedure:  LUMBAR WOUND DEBRIDEMENT DRAINAGE AND IRRIGATION; AND ASPIRATION OF LEFT KNEE;  Surgeon: Jessy Oto, MD;  Location: [place];  Service: Orthopedics;  Laterality: N/A;    Allergies:  Allergies  Allergen Reactions  . Unasyn [Ampicillin-Sulbactam Sodium] Hives, Itching, Swelling and Rash    Medications:   Prior to Admission medications   Medication Sig Start Date End Date Taking? Authorizing Provider  calcium citrate-vitamin D (CITRACAL+D) 315-200 MG-UNIT per tablet Take 1 tablet by mouth daily.    Yes [provider]  DULoxetine (CYMBALTA) 60 MG capsule TAKE 1 CAPSULE(60 MG) BY MOUTH DAILY 05/25/18  Yes Magnus Sinning, MD  meloxicam (MOBIC) 15 MG tablet Take 1 tablet (15 mg total) by mouth daily. 02/16/18  Yes Jessy Oto, MD  vitamin B-12 (CYANOCOBALAMIN) 1000 MCG tablet Take 1,000 mcg by mouth daily.   Yes [provider]    Inpatient medications: . acetaminophen  1,000 mg Oral Q6H  . bisacodyl  10 mg Rectal Daily  . calcium-vitamin D  1 tablet Oral Daily  . docusate sodium  100 mg Oral BID  . DULoxetine  60 mg Oral Daily  . enoxaparin (LOVENOX) injection  30 mg Subcutaneous Q24H  . HYDROcodone-acetaminophen  1 tablet Oral Q6H  . pantoprazole  40 mg Oral BID AC  . sodium chloride flush  3 mL Intravenous Q12H  . tamsulosin  0.4 mg Oral Daily  . vitamin B-12  1,000 mcg Oral Daily    Discontinued Meds:   Medications Discontinued During This Encounter  Medication Reason  . Multiple Vitamin (MULTIVITAMIN WITH MINERALS) TABS Patient Preference  . hydrocortisone cream 1 % Patient Preference  . oxyCODONE-acetaminophen (PERCOCET/ROXICET) 5-325 MG per tablet 2 tablet   . midazolam (VERSED) 2 MG/2ML injection Returned to ADS  . methylPREDNISolone sodium succinate (SOLU-MEDROL) 5,000 mg in sodium chloride 0.9 % 290 mL infusion   . ceFEPIme (MAXIPIME) 2 g in sodium chloride 0.9 % 100 mL IVPB   . vancomycin (VANCOCIN) IVPB 1000 mg/200 mL premix   . vancomycin  (VANCOCIN) powder Patient Discharge  . polymyxin B 500,000 Units, bacitracin 50,000 Units in sodium chloride 0.9 % 500 mL irrigation Patient Discharge  . povidone-iodine 10 % swab 2 application Patient Transfer  . fentaNYL (SUBLIMAZE) injection 25-50 mcg Patient Transfer  . 0.9 %  sodium chloride infusion Patient Transfer  . diphenhydrAMINE (BENADRYL) 12.5 MG/5ML elixir 12.5-25 mg Patient Transfer  . perflutren lipid microspheres (DEFINITY) IV suspension Patient Transfer  . 0.9 %  sodium chloride infusion Patient Transfer  . midazolam (VERSED) injection Patient Discharge  . fentaNYL (SUBLIMAZE) injection Patient Discharge  . butamben-tetracaine-benzocaine (CETACAINE) spray Patient Discharge  . 0.9 %  sodium chloride infusion Patient Transfer    Social History:  reports that he quit smoking about 44 years ago. His smoking use included cigarettes. He has never used smokeless tobacco. He reports that he does drink 2 beers daily but no illicit drug use.  Family History:  History reviewed. No pertinent family history.  A comprehensive review of systems was negative except for: Constitutional: positive for anorexia and malaise Gastrointestinal: positive for nausea and vomiting Genitourinary: positive for urinary retention Musculoskeletal: positive for arthralgias, back pain and muscle weakness Neurological: positive for bilateral leg weakness Weight change:   Intake/Output Summary (Last 24 hours) at 07/01/2018 1628 Last data filed at 07/01/2018 1300 Gross per 24 hour  Intake 4875.36 ml  Output 1105 ml  Net 3770.36 ml   BP (!) 157/96 (BP Location: Left Arm)   Pulse 88   Temp (!) 97.5 F (36.4 C) (Oral)   Resp 16   Ht 5' 10" (1.778 m)   Wt (!) 147.8 kg   SpO2 99%   BMI 46.75 kg/m  Vitals:   07/01/18 1225 07/01/18 1230 07/01/18 1239 07/01/18 1338  BP: (!) 147/107 (!) 156/86 138/86 (!) 157/96  Pulse: 87 84  88  Resp: _0 Temp:  98.7 F (37.1 C)  (!) 97.5 F (36.4 C)   TempSrc:  Oral  Oral  SpO2: 98% 100% 97% 99%  Weight:      Height:         General appearance: alert, cooperative and morbidly obese Head: Normocephalic, without obvious abnormality, atraumatic Eyes: negative findings: lids and lashes normal, conjunctivae and sclerae normal and corneas clear Neck: no adenopathy, no carotid bruit, no JVD, supple,  symmetrical, trachea midline and thyroid not enlarged, symmetric, no tenderness/mass/nodules Resp: clear to auscultation bilaterally Cardio: regular rate and rhythm, S1, S2 normal, no murmur, click, rub or gallop GI: obese, +BS, soft, Nontender Extremities: extremities normal, atraumatic, no cyanosis or edema and bandage over left knee aspiration site  Labs: Basic Metabolic Panel: Recent Labs  Lab 06/28/18 0002 06/29/18 0310 06/30/18 0452 07/01/18 0501  NA 141 141 141 137  K 4.0 3.6 3.5 3.8  CL 101 107 108 104  CO2 _0 21*  GLUCOSE 147* 187* 158* 138*  BUN 26* 42* 58* 79*  CREATININE 1.77* 2.12* 2.47* 2.99*  ALBUMIN 3.0* 2.6*  --   --   CALCIUM 9.0 8.6* 8.4* 8.1*   Liver Function Tests: Recent Labs  Lab 06/28/18 0002 06/29/18 0310  AST 47* 59*  ALT 21 28  ALKPHOS 93 99  BILITOT 1.7* 1.6*  PROT 6.9 6.7  ALBUMIN 3.0* 2.6*   No results for input(s): LIPASE, AMYLASE in the last 168 hours. No results for input(s): AMMONIA in the last 168 hours. CBC: Recent Labs  Lab 06/28/18 0002 06/29/18 0310 06/30/18 0452  WBC 13.5* 12.6* 13.7*  NEUTROABS 12.5*  --   --   HGB 14.2 13.5 12.5*  HCT 44.5 42.6 38.9*  MCV 89.4 88.4 87.4  PLT 137* 144* 124*   PT/INR: _1 (inr:5) Cardiac Enzymes: )No results for input(s): CKTOTAL, CKMB, CKMBINDEX, TROPONINI in the last 168 hours. CBG: No results for input(s): GLUCAP in the last 168 hours.  Iron Studies: No results for input(s): IRON, TIBC, TRANSFERRIN, FERRITIN in the last 168 hours.  Xrays/Other Studies: US Renal  Result Date: 06/30/2018 CLINICAL DATA:  Acute tubular  necrosis, hypertension, obesity, chronic kidney disease EXAM: RENAL / URINARY TRACT ULTRASOUND COMPLETE COMPARISON:  CT abdomen pelvis of 03/21/2016 FINDINGS: Right Kidney: Length: 13.6 cm. No hydronephrosis is seen. The renal parenchyma appears normal. There is a nonobstructing calculus in the lower right kidney measuring 10 x 6 mm. Left Kidney: Length: 14.2 cm. No hydronephrosis is seen. There are small hypoechoic structures in the mid and lower lateral left kidney consistent with cysts of 2.7 and 1.3 cm. Bladder: The urinary bladder is decompressed with Foley catheter present. IMPRESSION: 1. No hydronephrosis. 2. Nonobstructing lower pole right renal calculus of 10 x 6 mm. 3. Small hypoechoic structures consistent with cysts on the left. No left renal calculi are seen. Electronically Signed   By: Ivar Drape M.D.   On: 06/30/2018 10:47   Mr Shoulder Left Wo Contrast  Result Date: 06/30/2018 CLINICAL DATA:  [place] 1 week ago.  Left shoulder pain. EXAM: MRI OF THE LEFT SHOULDER WITHOUT CONTRAST TECHNIQUE: Multiplanar, multisequence MR imaging of the shoulder was performed. No intravenous contrast was administered. COMPARISON:  Left shoulder radiographs 06/28/2018 FINDINGS: Examination is quite limited due to the patient's size and motion. Rotator cuff: There is a large, full-thickness retracted supraspinatus tendon tear. Maximum retraction is 28 mm and the tear is approximately 22 mm wide. Moderate infraspinatus tendinopathy and mild subscapularis tendinopathy. Muscles: Diffuse fatty infiltrative changes involving all of the shoulder muscles. There is also moderate edema like signal change involving the rotator cuff muscles and the posterior deltoid musculature. Possible muscle strains or tears. Biceps long head:  Intact Acromioclavicular Joint: Moderate to advanced degenerative changes with inferior spurring. Type 2 acromion. No lateral downsloping or undersurface spurring. Glenohumeral Joint: Moderate  degenerative changes with moderate cartilage loss, joint space narrowing and early spurring changes. There is also mild subchondral  cystic change involving the glenoid. There is a small joint effusion. No significant synovitis. Labrum:  No obvious labral tears but exam is quite limited for that. Bones:  No acute bony findings.  No fracture or bone lesions. Other: Expected fluid in the subacromial/subdeltoid bursa. IMPRESSION: 1. Full-thickness retracted supraspinatus tendon tear measuring 28 x 22 mm. The infraspinatus and subscapularis tendons are intact. 2. Diffuse fatty infiltrative changes involving all the shoulder muscles. There is also moderate edema in the rotator cuff muscles and also in the posterior deltoid muscle which could be muscle strains partial tears. 3. Intact long head biceps tendon and grossly normal glenoid labrum. 4. Moderate glenohumeral joint degenerative changes and moderate to advanced AC joint degenerative changes. Electronically Signed   By: Marijo Sanes M.D.   On: 06/30/2018 15:48   Dg Foot Complete Left  Result Date: 06/30/2018 CLINICAL DATA:  Left great toe ulcer. EXAM: LEFT FOOT - COMPLETE 3+ VIEW COMPARISON:  No recent. FINDINGS: No acute soft tissue bony abnormality identified. Degenerative changes noted about the left foot, most prominent about the first MTP joint. No radiopaque foreign body. IMPRESSION: No acute abnormality identified. Specifically no erosive abnormalities are identified. Diffuse degenerative change, most prominent the first MTP joint. Electronically Signed   By: Marcello Moores  Register   On: 06/30/2018 11:15     Assessment/Plan: 1.  AKI/CKD stage 2 (presumably due to diabetic nephropathy)- in setting of MSSA bacteremia and lumbar abscess.  He had been receiving NSAIDs prior to admission and had some nephrotoxic agents at admission but none since.  His FeNa was <1% consistent with pre-renal but could also be seen in acute GN.  Will increase IVF"s to 142m/hr  and start acute GN workup.  Likely had some NSAID nephropathy upon admission which worsened with surgery and acute illness and urinary retention.  No indication for HD at this time and will continue to follow.  Renal dose meds for eGFR of <10 ml/min as his Cr continues to climb.  Will also check CK level to r/o rhabdo following lumbar surgery. 2. MSSA bacteremia/septic arthritis/lumbar epidural abscess- on Ancef per ID and TEE negative 3. Epidural abscess wound- will require wound vac, WOC nurse consulted 4. Urinary retention- due to epidural abscess s/p foley catheter without obstruction 5. Nephrolithiasis- non-obstructing stones 6. DM- poorly controlled, insulin per primary svc 7. Morbid obesity 8. HTN- avoid ACE/ARB for now 9. Proteinuria- will check prot/creat ratio and check SPEP/UPEP 10. Moderate protein malnutrition 11. OSA not treated with CPAP   JDonetta Potts9/18/2019, 4:28 PM

## 2018-07-01 NOTE — Progress Notes (Signed)
Consultation PROGRESS NOTE  [name] [name] ZOX:[id number] DOB: 13-May-1956 DOA: 06/27/2018 PCP: Renford Dills, MD  HPI/Recap of past 24 hours:  Patient is seen after returning from[facility]   Wound vac to his back with bloody drainage Left knee drain has removed He c/o scheduled left shoulder pain  He reports being constipated  Cr continue to worsen, urine output decreased 650cc documented last 24hrs   Wife at bedside  Assessment/Plan: Principal Problem:   Spondylosis, thoracic, with myelopathy Active Problems:   Thoracic myelopathy   Abscess in epidural space of L2-L5 lumbar spine   Acute urinary retention   Left cervical radiculopathy   Leukocytosis   Postoperative seroma involving nervous system after nervous system procedure   Shoulder pain, bilateral   Septic arthritis of knee, left (HCC)  Sepsis secondary to MSSA bacteremia and lumbar epidural abscess (presenting symptom). -s/pLUMBAR WOUND DEBRIDEMENT DRAINAGE AND IRRIGATION on 9/17 -[facility] no endocarditis - WBC improving.  Afebrile.  Blood culture pansensitive.  Continue Ancef.  Antibiotic duration per ID.  -plan per ID and primary team  Septic arthritis, left knee S/p ARTHROSCOPY KNEE UTILIZING PORTALS FOR IRRIGATION AND DRAINAGE OF LEFT KNEE PYARTHROSIS. Left knee fluids culture+ MSSA pan sensitivy Plan per ID and primary team  Left shoulder pain -mri left shoulder "Full-thickness retracted supraspinatus tendon tear measuring 28 x 22 mm. " -plan per primary team  Left wrist swelling Has some tenderness but no obvious joint tenderness.  X-ray shows soft tissue swelling only.  If symptoms persist and may need synovial aspiration of the joint by primary.  cervical and thoracic myelopathy.  Seen by neurosurgery Dr Jordan Likes who recommends some form of decompressive surgery once acute issues have resolved.   AKI on CKDII Renal US no obstruction He did has urinary retention on admission with required foley placement, foley  still in place  He has been on ns 125cc/hr since 9/15 Renal function continue to worse, case discussed with nephrology Dr Lewis Moccasin who graciously accepted the consult, will see patient today  Urinary retention Foley placed on admission, he is started on flomax    Morbid obesity: Body mass index is 46.75 kg/m.   Code Status: full  Family Communication: patient and wife  Disposition Plan: per primary team   Consultants:  [facility]  Infectious disease   nephrology  Procedures:  As above  Antibiotics:  As above   Objective: BP (!) 142/84 (BP Location: Left Arm)   Pulse 84   Temp 97.6 F (36.4 C) (Oral)   Resp 16   Ht 5\' 10"  (1.778 m)   Wt (!) 147.8 kg   SpO2 98%   BMI 46.75 kg/m   Intake/Output Summary (Last 24 hours) at 07/01/2018 0825 Last data filed at 07/01/2018 8119 Gross per 24 hour  Intake 4015.36 ml  Output 755 ml  Net 3260.36 ml   Filed Weights   06/28/18 1424  Weight: (!) 147.8 kg    Exam: Patient is examined daily including today on 07/01/2018, exams remain the same as of yesterday except that has changed    General:  NAD, obese, wound vac to back  Cardiovascular: RRR  Respiratory: CTABL  Abdomen: Soft/ND/NT, positive BS  Musculoskeletal: left knee wrapped, no significant  Edema  Neuro: alert, oriented   Data Reviewed: Basic Metabolic Panel: Recent Labs  Lab 06/28/18 0002 06/29/18 0310 06/30/18 0452 07/01/18 0501  NA 141 141 141 137  K 4.0 3.6 3.5 3.8  CL 101 107 108 104  CO2 27 24 22  21*  GLUCOSE 147* 187* 158* 138*  BUN 26* 42* 58* 79*  CREATININE 1.77* 2.12* 2.47* 2.99*  CALCIUM 9.0 8.6* 8.4* 8.1*   Liver Function Tests: Recent Labs  Lab 06/28/18 0002 06/29/18 0310  AST 47* 59*  ALT 21 28  ALKPHOS 93 99  BILITOT 1.7* 1.6*  PROT 6.9 6.7  ALBUMIN 3.0* 2.6*   No results for input(s): LIPASE, AMYLASE in the last 168 hours. No results for input(s): AMMONIA in the last 168 hours. CBC: Recent Labs    Lab 06/28/18 0002 06/29/18 0310 06/30/18 0452  WBC 13.5* 12.6* 13.7*  NEUTROABS 12.5*  --   --   HGB 14.2 13.5 12.5*  HCT 44.5 42.6 38.9*  MCV 89.4 88.4 87.4  PLT 137* 144* 124*   Cardiac Enzymes:   No results for input(s): CKTOTAL, CKMB, CKMBINDEX, TROPONINI in the last 168 hours. BNP (last 3 results) No results for input(s): BNP in the last 8760 hours.  ProBNP (last 3 results) No results for input(s): PROBNP in the last 8760 hours.  CBG: No results for input(s): GLUCAP in the last 168 hours.  Recent Results (from the past 240 hour(s))  Culture, Urine     Status: Abnormal   Collection Time: 06/28/18 11:59 AM  Result Value Ref Range Status   Specimen Description URINE, CATHETERIZED  Final   Special Requests   Final    Normal Performed at [facility], [address] [place], [place] [address]    Culture MULTIPLE SPECIES PRESENT, SUGGEST RECOLLECTION (A)  Final   Report Status 06/29/2018 FINAL  Final  Culture, blood (routine x 2)     Status: Abnormal   Collection Time: 06/28/18  3:30 PM  Result Value Ref Range Status   Specimen Description BLOOD LEFT ANTECUBITAL  Final   Special Requests   Final    BOTTLES DRAWN AEROBIC AND ANAEROBIC Blood Culture adequate volume   Culture  Setup Time   Final    GRAM POSITIVE COCCI IN BOTH AEROBIC AND ANAEROBIC BOTTLES CRITICAL RESULT CALLED TO, READ BACK BY AND VERIFIED WITH: G. ABBOTT PHARMD, AT 0724 06/29/18 BY D. VANHOOK    Culture (A)  Final    STAPHYLOCOCCUS AUREUS SUSCEPTIBILITIES PERFORMED ON PREVIOUS CULTURE WITHIN THE LAST 5 DAYS. Performed at [facility], [address] [place], [place] [address]    Report Status 07/01/2018 FINAL  Final  Culture, blood (routine x 2)     Status: Abnormal   Collection Time: 06/28/18  3:35 PM  Result Value Ref Range Status   Specimen Description BLOOD LEFT HAND  Final   Special Requests   Final    BOTTLES DRAWN AEROBIC AND ANAEROBIC Blood Culture adequate volume   Culture   Setup Time   Final    GRAM POSITIVE COCCI IN BOTH AEROBIC AND ANAEROBIC BOTTLES CRITICAL RESULT CALLED TO, READ BACK BY AND VERIFIED WITH: G. ABBOTT PHARMD, AT 0724 06/29/18 BY D. VANHOOK    Culture (A)  Final    STAPHYLOCOCCUS AUREUS BACILLUS SPECIES Standardized susceptibility testing for this organism is not available. CRITICAL RESULT CALLED TO, READ BACK BY AND VERIFIED WITH: Patrick North PHARMD, AT 1519 06/30/18 BY D. VANHOOK REGARDING CULTURE GROWTH Performed at [facility], [address] [place], [place] [address]    Report Status 07/01/2018 FINAL  Final   Organism ID, Bacteria STAPHYLOCOCCUS AUREUS  Final      Susceptibility   Staphylococcus aureus - MIC*    CIPROFLOXACIN <=0.5  SENSITIVE Sensitive     ERYTHROMYCIN <=0.25 SENSITIVE Sensitive     GENTAMICIN <=0.5 SENSITIVE Sensitive     OXACILLIN 0.5 SENSITIVE Sensitive     TETRACYCLINE <=1 SENSITIVE Sensitive     VANCOMYCIN <=0.5 SENSITIVE Sensitive     TRIMETH/SULFA <=10 SENSITIVE Sensitive     CLINDAMYCIN <=0.25 SENSITIVE Sensitive     RIFAMPIN <=0.5 SENSITIVE Sensitive     Inducible Clindamycin NEGATIVE Sensitive     * STAPHYLOCOCCUS AUREUS  Blood Culture ID Panel (Reflexed)     Status: Abnormal   Collection Time: 06/28/18  3:35 PM  Result Value Ref Range Status   Enterococcus species NOT DETECTED NOT DETECTED Final   Listeria monocytogenes NOT DETECTED NOT DETECTED Final   Staphylococcus species DETECTED (A) NOT DETECTED Final    Comment: CRITICAL RESULT CALLED TO, READ BACK BY AND VERIFIED WITH: G. ABBOTT PHARMD, AT 1610 06/29/18 BY D. VANHOOK    Staphylococcus aureus DETECTED (A) NOT DETECTED Final    Comment: Methicillin (oxacillin) susceptible Staphylococcus aureus (MSSA). Preferred therapy is anti staphylococcal beta lactam antibiotic (Cefazolin or Nafcillin), unless clinically contraindicated. CRITICAL RESULT CALLED TO, READ BACK BY AND VERIFIED WITH: G. ABBOTT PHARMD, AT 0724 06/29/18 BY D. VANHOOK     Methicillin resistance NOT DETECTED NOT DETECTED Final   Streptococcus species NOT DETECTED NOT DETECTED Final   Streptococcus agalactiae NOT DETECTED NOT DETECTED Final   Streptococcus pneumoniae NOT DETECTED NOT DETECTED Final   Streptococcus pyogenes NOT DETECTED NOT DETECTED Final   Acinetobacter baumannii NOT DETECTED NOT DETECTED Final   Enterobacteriaceae species NOT DETECTED NOT DETECTED Final   Enterobacter cloacae complex NOT DETECTED NOT DETECTED Final   Escherichia coli NOT DETECTED NOT DETECTED Final   Klebsiella oxytoca NOT DETECTED NOT DETECTED Final   Klebsiella pneumoniae NOT DETECTED NOT DETECTED Final   Proteus species NOT DETECTED NOT DETECTED Final   Serratia marcescens NOT DETECTED NOT DETECTED Final   Haemophilus influenzae NOT DETECTED NOT DETECTED Final   Neisseria meningitidis NOT DETECTED NOT DETECTED Final   Pseudomonas aeruginosa NOT DETECTED NOT DETECTED Final   Candida albicans NOT DETECTED NOT DETECTED Final   Candida glabrata NOT DETECTED NOT DETECTED Final   Candida krusei NOT DETECTED NOT DETECTED Final   Candida parapsilosis NOT DETECTED NOT DETECTED Final   Candida tropicalis NOT DETECTED NOT DETECTED Final    Comment: Performed at [facility], [address], [place], [place] [address]  Aerobic/Anaerobic Culture (surgical/deep wound)     Status: None (Preliminary result)   Collection Time: 06/28/18  5:01 PM  Result Value Ref Range Status   Specimen Description ABSCESS LUMBAR  Final   Special Requests NONE  Final   Gram Stain   Final    ABUNDANT WBC PRESENT, PREDOMINANTLY PMN ABUNDANT GRAM POSITIVE COCCI IN PAIRS IN CLUSTERS Performed at [facility], [address], [place], [place] [address]    Culture   Final    MODERATE STAPHYLOCOCCUS AUREUS NO ANAEROBES ISOLATED; CULTURE IN PROGRESS FOR 5 DAYS    Report Status PENDING  Incomplete   Organism ID, Bacteria STAPHYLOCOCCUS AUREUS  Final      Susceptibility   Staphylococcus  aureus - MIC*    CIPROFLOXACIN <=0.5 SENSITIVE Sensitive     ERYTHROMYCIN <=0.25 SENSITIVE Sensitive     GENTAMICIN <=0.5 SENSITIVE Sensitive     OXACILLIN <=0.25 SENSITIVE Sensitive     TETRACYCLINE <=1 SENSITIVE Sensitive     VANCOMYCIN <=0.5 SENSITIVE Sensitive  TRIMETH/SULFA <=10 SENSITIVE Sensitive     CLINDAMYCIN <=0.25 SENSITIVE Sensitive     RIFAMPIN <=0.5 SENSITIVE Sensitive     Inducible Clindamycin NEGATIVE Sensitive     * MODERATE STAPHYLOCOCCUS AUREUS  MRSA PCR Screening     Status: None   Collection Time: 06/29/18  4:14 PM  Result Value Ref Range Status   MRSA by PCR NEGATIVE NEGATIVE Final    Comment:        The GeneXpert MRSA Assay (FDA approved for NASAL specimens only), is one component of a comprehensive MRSA colonization surveillance program. It is not intended to diagnose MRSA infection nor to guide or monitor treatment for MRSA infections. Performed at [facility], [address], [place], [place] [address]   Aerobic/Anaerobic Culture (surgical/deep wound)     Status: None (Preliminary result)   Collection Time: 06/29/18  6:08 PM  Result Value Ref Range Status   Specimen Description FLUID LEFT KNEE  Final   Special Requests NONE  Final   Gram Stain   Final    ABUNDANT WBC PRESENT, PREDOMINANTLY PMN RARE GRAM POSITIVE COCCI RESULT CALLED TO, READ BACK BY AND VERIFIED WITH: DR Otelia Sergeant 06/29/18 2012 Performed at [facility], [address] [place], [place] [address]    Culture RARE STAPHYLOCOCCUS AUREUS  Final   Report Status PENDING  Incomplete  Aerobic/Anaerobic Culture (surgical/deep wound)     Status: None (Preliminary result)   Collection Time: 06/29/18  7:31 PM  Result Value Ref Range Status   Specimen Description   Final    ABSCESS Performed at [facility], [address] [place], [place] [address]    Special Requests LUMBAR ABSCESS  Final   Gram Stain PENDING  Incomplete   Culture ABUNDANT STAPHYLOCOCCUS AUREUS  Final    Report Status PENDING  Incomplete  Culture, blood (routine x 2)     Status: None (Preliminary result)   Collection Time: 06/30/18  4:52 AM  Result Value Ref Range Status   Specimen Description BLOOD LEFT ARM  Final   Special Requests   Final    BOTTLES DRAWN AEROBIC AND ANAEROBIC Blood Culture adequate volume   Culture   Final    NO GROWTH 1 DAY Performed at [facility], [address] [place], [place] [address]    Report Status PENDING  Incomplete  Culture, blood (routine x 2)     Status: None (Preliminary result)   Collection Time: 06/30/18  4:55 AM  Result Value Ref Range Status   Specimen Description BLOOD LEFT WRIST  Final   Special Requests   Final    BOTTLES DRAWN AEROBIC AND ANAEROBIC Blood Culture adequate volume   Culture   Final    NO GROWTH 1 DAY Performed at [facility], [address], [place], [place] [address]    Report Status PENDING  Incomplete     Studies: US Renal  Result Date: 06/30/2018 CLINICAL DATA:  Acute tubular necrosis, hypertension, obesity, chronic kidney disease EXAM: RENAL / URINARY TRACT ULTRASOUND COMPLETE COMPARISON:  CT abdomen pelvis of 03/21/2016 FINDINGS: Right Kidney: Length: 13.6 cm. No hydronephrosis is seen. The renal parenchyma appears normal. There is a nonobstructing calculus in the lower right kidney measuring 10 x 6 mm. Left Kidney: Length: 14.2 cm. No hydronephrosis is seen. There are small hypoechoic structures in the mid and lower lateral left kidney consistent with cysts of 2.7 and 1.3 cm. Bladder: The urinary bladder is decompressed with  Foley catheter present. IMPRESSION: 1. No hydronephrosis. 2. Nonobstructing lower pole right renal calculus of 10 x 6 mm. 3. Small hypoechoic structures consistent with cysts on the left. No left renal calculi are seen. Electronically Signed   By: Dwyane Dee M.D.   On: 06/30/2018 10:47   Mr Shoulder Left Wo Contrast  Result Date: 06/30/2018 CLINICAL DATA:  Larey Seat 1 week ago.  Left  shoulder pain. EXAM: MRI OF THE LEFT SHOULDER WITHOUT CONTRAST TECHNIQUE: Multiplanar, multisequence MR imaging of the shoulder was performed. No intravenous contrast was administered. COMPARISON:  Left shoulder radiographs 06/28/2018 FINDINGS: Examination is quite limited due to the patient's size and motion. Rotator cuff: There is a large, full-thickness retracted supraspinatus tendon tear. Maximum retraction is 28 mm and the tear is approximately 22 mm wide. Moderate infraspinatus tendinopathy and mild subscapularis tendinopathy. Muscles: Diffuse fatty infiltrative changes involving all of the shoulder muscles. There is also moderate edema like signal change involving the rotator cuff muscles and the posterior deltoid musculature. Possible muscle strains or tears. Biceps long head:  Intact Acromioclavicular Joint: Moderate to advanced degenerative changes with inferior spurring. Type 2 acromion. No lateral downsloping or undersurface spurring. Glenohumeral Joint: Moderate degenerative changes with moderate cartilage loss, joint space narrowing and early spurring changes. There is also mild subchondral cystic change involving the glenoid. There is a small joint effusion. No significant synovitis. Labrum:  No obvious labral tears but exam is quite limited for that. Bones:  No acute bony findings.  No fracture or bone lesions. Other: Expected fluid in the subacromial/subdeltoid bursa. IMPRESSION: 1. Full-thickness retracted supraspinatus tendon tear measuring 28 x 22 mm. The infraspinatus and subscapularis tendons are intact. 2. Diffuse fatty infiltrative changes involving all the shoulder muscles. There is also moderate edema in the rotator cuff muscles and also in the posterior deltoid muscle which could be muscle strains partial tears. 3. Intact long head biceps tendon and grossly normal glenoid labrum. 4. Moderate glenohumeral joint degenerative changes and moderate to advanced AC joint degenerative changes.  Electronically Signed   By: Rudie Meyer M.D.   On: 06/30/2018 15:48   Dg Foot Complete Left  Result Date: 06/30/2018 CLINICAL DATA:  Left great toe ulcer. EXAM: LEFT FOOT - COMPLETE 3+ VIEW COMPARISON:  No recent. FINDINGS: No acute soft tissue bony abnormality identified. Degenerative changes noted about the left foot, most prominent about the first MTP joint. No radiopaque foreign body. IMPRESSION: No acute abnormality identified. Specifically no erosive abnormalities are identified. Diffuse degenerative change, most prominent the first MTP joint. Electronically Signed   By: Maisie Fus  Register   On: 06/30/2018 11:15    Scheduled Meds: . acetaminophen  1,000 mg Oral Q6H  . calcium-vitamin D  1 tablet Oral Daily  . docusate sodium  100 mg Oral BID  . DULoxetine  60 mg Oral Daily  . enoxaparin (LOVENOX) injection  30 mg Subcutaneous Q24H  . HYDROcodone-acetaminophen  1 tablet Oral Q6H  . pantoprazole  40 mg Oral BID AC  . sodium chloride flush  3 mL Intravenous Q12H  . tamsulosin  0.4 mg Oral Daily  . vitamin B-12  1,000 mcg Oral Daily    Continuous Infusions: . sodium chloride 125 mL/hr at 06/30/18 1723  . sodium chloride    . [START ON 07/02/2018] sodium chloride    .  ceFAZolin (ANCEF) IV 2 g (07/01/18 1610)  . methocarbamol (ROBAXIN) IV       Time spent: 35 mins I have personally reviewed and interpreted  on  07/01/2018 daily labs, imagings as discussed above under date review session and assessment and plans.  I reviewed all nursing notes, pharmacy notes, consultant notes,  vitals, pertinent old records  I have discussed plan of care as described above with RN , patient and family on 07/01/2018   Albertine GratesFang Kaelin Bonelli MD, PhD  [facility] Pager [phone]. If 7PM-7AM, please contact night-coverage at [email or web address], password [email or web address] 07/01/2018, 8:25 AM  LOS: 3 days

## 2018-07-01 NOTE — Progress Notes (Signed)
Patient ID: [name] [name], male   DOB: August 29, 1956, 62 y.o.   MRN: [id number]   Patient seen with wound care RN.  some bleeding around wound bed.  Patient currently on lovenox for DVT prophylaxis.  No purulence.  New wound vac applied.  Wound RN concerned about  Vac tube clotting off due to bleeding.  She will put order in for wet to dry dressings if this is the case.  Discussed with Dr Otelia SergeantNitka.

## 2018-07-01 NOTE — Progress Notes (Signed)
INFECTIOUS DISEASE PROGRESS NOTE  ID: [name] [name] is a 62 y.o. male with  Principal Problem:   Spondylosis, thoracic, with myelopathy Active Problems:   Thoracic myelopathy   Abscess in epidural space of L2-L5 lumbar spine   Acute urinary retention   Left cervical radiculopathy   Leukocytosis   Postoperative seroma involving nervous system after nervous system procedure   Shoulder pain, bilateral   Septic arthritis of knee, left (HCC)  Subjective: complains of pain in L shoulder, would like his scheduled quarterly injection.    Abtx:  Anti-infectives (From admission, onward)   Start     Dose/Rate Route Frequency Ordered Stop   06/29/18 1936  vancomycin (VANCOCIN) powder  Status:  Discontinued       As needed 06/29/18 1936 06/29/18 2121   06/29/18 1928  polymyxin B 500,000 Units, bacitracin 50,000 Units in sodium chloride 0.9 % 500 mL irrigation  Status:  Discontinued       As needed 06/29/18 1928 06/29/18 2121   06/29/18 1800  vancomycin (VANCOCIN) IVPB 1000 mg/200 mL premix  Status:  Discontinued     1,000 mg 200 mL/hr over 60 Minutes Intravenous  Once 06/29/18 0032 06/29/18 1042   06/29/18 1400  ceFAZolin (ANCEF) IVPB 2g/100 mL premix     2 g 200 mL/hr over 30 Minutes Intravenous Every 8 hours 06/29/18 1042     06/29/18 0200  ceFEPIme (MAXIPIME) 2 g in sodium chloride 0.9 % 100 mL IVPB  Status:  Discontinued     2 g 200 mL/hr over 30 Minutes Intravenous Every 8 hours 06/29/18 0031 06/29/18 1042   06/29/18 0130  vancomycin (VANCOCIN) 2,000 mg in sodium chloride 0.9 % 500 mL IVPB     2,000 mg 250 mL/hr over 120 Minutes Intravenous  Once 06/29/18 0032 06/29/18 0257      Medications:  Scheduled: . acetaminophen  1,000 mg Oral Q6H  . calcium-vitamin D  1 tablet Oral Daily  . docusate sodium  100 mg Oral BID  . DULoxetine  60 mg Oral Daily  . enoxaparin (LOVENOX) injection  30 mg Subcutaneous Q24H  . HYDROcodone-acetaminophen  1 tablet Oral Q6H  . pantoprazole  40 mg  Oral BID AC  . sodium chloride flush  3 mL Intravenous Q12H  . tamsulosin  0.4 mg Oral Daily  . vitamin B-12  1,000 mcg Oral Daily    Objective: Vital signs in last 24 hours: Temp:  [97.6 F (36.4 C)-98.3 F (36.8 C)] 97.6 F (36.4 C) (09/18 0339) Pulse Rate:  [77-84] 84 (09/18 0339) Resp:  [16-18] 16 (09/18 0339) BP: (129-144)/(58-84) 142/84 (09/18 0339) SpO2:  [98 %-100 %] 98 % (09/18 0339)   General appearance: alert, cooperative and no distress Resp: clear to auscultation bilaterally Cardio: regular rate and rhythm GI: normal findings: bowel sounds normal and soft, non-tender and abnormal findings:  distended Extremities: no tenderness L should, mild swelling.   Lab Results Recent Labs    06/29/18 0310 06/30/18 0452 07/01/18 0501  WBC 12.6* 13.7*  --   HGB 13.5 12.5*  --   HCT 42.6 38.9*  --   NA 141 141 137  K 3.6 3.5 3.8  CL 107 108 104  CO2 24 22 21*  BUN 42* 58* 79*  CREATININE 2.12* 2.47* 2.99*   Liver Panel Recent Labs    06/29/18 0310  PROT 6.7  ALBUMIN 2.6*  AST 59*  ALT 28  ALKPHOS 99  BILITOT 1.6*   Sedimentation Rate No results  for input(s): ESRSEDRATE in the last 72 hours. C-Reactive Protein No results for input(s): CRP in the last 72 hours.  Microbiology: Recent Results (from the past 240 hour(s))  Culture, Urine     Status: Abnormal   Collection Time: 06/28/18 11:59 AM  Result Value Ref Range Status   Specimen Description URINE, CATHETERIZED  Final   Special Requests   Final    Normal Performed at [facility], [address] [place], [place] [address]    Culture MULTIPLE SPECIES PRESENT, SUGGEST RECOLLECTION (A)  Final   Report Status 06/29/2018 FINAL  Final  Culture, blood (routine x 2)     Status: Abnormal   Collection Time: 06/28/18  3:30 PM  Result Value Ref Range Status   Specimen Description BLOOD LEFT ANTECUBITAL  Final   Special Requests   Final    BOTTLES DRAWN AEROBIC AND ANAEROBIC Blood Culture adequate  volume   Culture  Setup Time   Final    GRAM POSITIVE COCCI IN BOTH AEROBIC AND ANAEROBIC BOTTLES CRITICAL RESULT CALLED TO, READ BACK BY AND VERIFIED WITH: G. ABBOTT PHARMD, AT 0724 06/29/18 BY D. VANHOOK    Culture (A)  Final    STAPHYLOCOCCUS AUREUS SUSCEPTIBILITIES PERFORMED ON PREVIOUS CULTURE WITHIN THE LAST 5 DAYS. Performed at [facility], [address] [place], [place] [address]    Report Status 07/01/2018 FINAL  Final  Culture, blood (routine x 2)     Status: Abnormal   Collection Time: 06/28/18  3:35 PM  Result Value Ref Range Status   Specimen Description BLOOD LEFT HAND  Final   Special Requests   Final    BOTTLES DRAWN AEROBIC AND ANAEROBIC Blood Culture adequate volume   Culture  Setup Time   Final    GRAM POSITIVE COCCI IN BOTH AEROBIC AND ANAEROBIC BOTTLES CRITICAL RESULT CALLED TO, READ BACK BY AND VERIFIED WITH: G. ABBOTT PHARMD, AT 0724 06/29/18 BY D. VANHOOK    Culture (A)  Final    STAPHYLOCOCCUS AUREUS BACILLUS SPECIES Standardized susceptibility testing for this organism is not available. CRITICAL RESULT CALLED TO, READ BACK BY AND VERIFIED WITH: Patrick North. BATCHELDER PHARMD, AT 1519 06/30/18 BY D. VANHOOK REGARDING CULTURE GROWTH Performed at [facility], [address] [place], [place] [address]    Report Status 07/01/2018 FINAL  Final   Organism ID, Bacteria STAPHYLOCOCCUS AUREUS  Final      Susceptibility   Staphylococcus aureus - MIC*    CIPROFLOXACIN <=0.5 SENSITIVE Sensitive     ERYTHROMYCIN <=0.25 SENSITIVE Sensitive     GENTAMICIN <=0.5 SENSITIVE Sensitive     OXACILLIN 0.5 SENSITIVE Sensitive     TETRACYCLINE <=1 SENSITIVE Sensitive     VANCOMYCIN <=0.5 SENSITIVE Sensitive     TRIMETH/SULFA <=10 SENSITIVE Sensitive     CLINDAMYCIN <=0.25 SENSITIVE Sensitive     RIFAMPIN <=0.5 SENSITIVE Sensitive     Inducible Clindamycin NEGATIVE Sensitive     * STAPHYLOCOCCUS AUREUS  Blood Culture ID Panel (Reflexed)     Status: Abnormal    Collection Time: 06/28/18  3:35 PM  Result Value Ref Range Status   Enterococcus species NOT DETECTED NOT DETECTED Final   Listeria monocytogenes NOT DETECTED NOT DETECTED Final   Staphylococcus species DETECTED (A) NOT DETECTED Final    Comment: CRITICAL RESULT CALLED TO, READ BACK BY AND VERIFIED WITH: G. ABBOTT PHARMD, AT 0724 06/29/18 BY D. VANHOOK    Staphylococcus aureus DETECTED (A) NOT DETECTED Final  Comment: Methicillin (oxacillin) susceptible Staphylococcus aureus (MSSA). Preferred therapy is anti staphylococcal beta lactam antibiotic (Cefazolin or Nafcillin), unless clinically contraindicated. CRITICAL RESULT CALLED TO, READ BACK BY AND VERIFIED WITH: G. ABBOTT PHARMD, AT 0724 06/29/18 BY D. VANHOOK    Methicillin resistance NOT DETECTED NOT DETECTED Final   Streptococcus species NOT DETECTED NOT DETECTED Final   Streptococcus agalactiae NOT DETECTED NOT DETECTED Final   Streptococcus pneumoniae NOT DETECTED NOT DETECTED Final   Streptococcus pyogenes NOT DETECTED NOT DETECTED Final   Acinetobacter baumannii NOT DETECTED NOT DETECTED Final   Enterobacteriaceae species NOT DETECTED NOT DETECTED Final   Enterobacter cloacae complex NOT DETECTED NOT DETECTED Final   Escherichia coli NOT DETECTED NOT DETECTED Final   Klebsiella oxytoca NOT DETECTED NOT DETECTED Final   Klebsiella pneumoniae NOT DETECTED NOT DETECTED Final   Proteus species NOT DETECTED NOT DETECTED Final   Serratia marcescens NOT DETECTED NOT DETECTED Final   Haemophilus influenzae NOT DETECTED NOT DETECTED Final   Neisseria meningitidis NOT DETECTED NOT DETECTED Final   Pseudomonas aeruginosa NOT DETECTED NOT DETECTED Final   Candida albicans NOT DETECTED NOT DETECTED Final   Candida glabrata NOT DETECTED NOT DETECTED Final   Candida krusei NOT DETECTED NOT DETECTED Final   Candida parapsilosis NOT DETECTED NOT DETECTED Final   Candida tropicalis NOT DETECTED NOT DETECTED Final    Comment: Performed at  [facility], [address], [place], [place] [address]  Aerobic/Anaerobic Culture (surgical/deep wound)     Status: None (Preliminary result)   Collection Time: 06/28/18  5:01 PM  Result Value Ref Range Status   Specimen Description ABSCESS LUMBAR  Final   Special Requests NONE  Final   Gram Stain   Final    ABUNDANT WBC PRESENT, PREDOMINANTLY PMN ABUNDANT GRAM POSITIVE COCCI IN PAIRS IN CLUSTERS Performed at [facility], [address], [place], [place] [address]    Culture   Final    MODERATE STAPHYLOCOCCUS AUREUS NO ANAEROBES ISOLATED; CULTURE IN PROGRESS FOR 5 DAYS    Report Status PENDING  Incomplete   Organism ID, Bacteria STAPHYLOCOCCUS AUREUS  Final      Susceptibility   Staphylococcus aureus - MIC*    CIPROFLOXACIN <=0.5 SENSITIVE Sensitive     ERYTHROMYCIN <=0.25 SENSITIVE Sensitive     GENTAMICIN <=0.5 SENSITIVE Sensitive     OXACILLIN <=0.25 SENSITIVE Sensitive     TETRACYCLINE <=1 SENSITIVE Sensitive     VANCOMYCIN <=0.5 SENSITIVE Sensitive     TRIMETH/SULFA <=10 SENSITIVE Sensitive     CLINDAMYCIN <=0.25 SENSITIVE Sensitive     RIFAMPIN <=0.5 SENSITIVE Sensitive     Inducible Clindamycin NEGATIVE Sensitive     * MODERATE STAPHYLOCOCCUS AUREUS  MRSA PCR Screening     Status: None   Collection Time: 06/29/18  4:14 PM  Result Value Ref Range Status   MRSA by PCR NEGATIVE NEGATIVE Final    Comment:        The GeneXpert MRSA Assay (FDA approved for NASAL specimens only), is one component of a comprehensive MRSA colonization surveillance program. It is not intended to diagnose MRSA infection nor to guide or monitor treatment for MRSA infections. Performed at [facility], [address] [place], [place] [address]   Aerobic/Anaerobic Culture (surgical/deep wound)     Status: None (Preliminary result)   Collection Time: 06/29/18  6:08 PM  Result Value Ref Range Status   Specimen Description FLUID LEFT KNEE  Final   Special Requests NONE  Final   Gram Stain   Final    ABUNDANT WBC PRESENT, PREDOMINANTLY PMN RARE GRAM POSITIVE COCCI RESULT CALLED TO, READ BACK BY AND VERIFIED WITH: DR Otelia Sergeant 06/29/18 2012 Performed at [facility], [address], [place], [place] [address]    Culture RARE STAPHYLOCOCCUS AUREUS  Final   Report Status PENDING  Incomplete  Aerobic/Anaerobic Culture (surgical/deep wound)     Status: None (Preliminary result)   Collection Time: 06/29/18  7:31 PM  Result Value Ref Range Status   Specimen Description   Final    ABSCESS Performed at [facility], [address] [place], [place] [address]    Special Requests LUMBAR ABSCESS  Final   Gram Stain PENDING  Incomplete   Culture ABUNDANT STAPHYLOCOCCUS AUREUS  Final   Report Status PENDING  Incomplete  Culture, blood (routine x 2)     Status: None (Preliminary result)   Collection Time: 06/30/18  4:52 AM  Result Value Ref Range Status   Specimen Description BLOOD LEFT ARM  Final   Special Requests   Final    BOTTLES DRAWN AEROBIC AND ANAEROBIC Blood Culture adequate volume   Culture   Final    NO GROWTH 1 DAY Performed at [facility], [address], [place], [place] [address]    Report Status PENDING  Incomplete  Culture, blood (routine x 2)     Status: None (Preliminary result)   Collection Time: 06/30/18  4:55 AM  Result Value Ref Range Status   Specimen Description BLOOD LEFT WRIST  Final   Special Requests   Final    BOTTLES DRAWN AEROBIC AND ANAEROBIC Blood Culture adequate volume   Culture   Final    NO GROWTH 1 DAY Performed at [facility], [address], [place], [place] [address]    Report Status PENDING  Incomplete    Studies/Results: Dg Wrist 2 Views Left  Result Date: 06/29/2018 CLINICAL DATA:  Wrist pain.  Initial encounter. EXAM: LEFT WRIST - 2 VIEW COMPARISON:  None. FINDINGS: Marked soft tissue swelling is present over the dorsum of the wrist. This appears to be worse on the ulnar side.  IMPRESSION: Extensive soft tissue swelling over the dorsum of the wrist without underlying fracture. Electronically Signed   By: Marin Roberts M.D.   On: 06/29/2018 15:56   US Renal  Result Date: 06/30/2018 CLINICAL DATA:  Acute tubular necrosis, hypertension, obesity, chronic kidney disease EXAM: RENAL / URINARY TRACT ULTRASOUND COMPLETE COMPARISON:  CT abdomen pelvis of 03/21/2016 FINDINGS: Right Kidney: Length: 13.6 cm. No hydronephrosis is seen. The renal parenchyma appears normal. There is a nonobstructing calculus in the lower right kidney measuring 10 x 6 mm. Left Kidney: Length: 14.2 cm. No hydronephrosis is seen. There are small hypoechoic structures in the mid and lower lateral left kidney consistent with cysts of 2.7 and 1.3 cm. Bladder: The urinary bladder is decompressed with Foley catheter present. IMPRESSION: 1. No hydronephrosis. 2. Nonobstructing lower pole right renal calculus of 10 x 6 mm. 3. Small hypoechoic structures consistent with cysts on the left. No left renal calculi are seen. Electronically Signed   By: Dwyane Dee M.D.   On: 06/30/2018 10:47   Mr Shoulder Left Wo Contrast  Result Date: 06/30/2018 CLINICAL DATA:  Larey Seat 1 week ago.  Left shoulder pain. EXAM: MRI OF THE LEFT SHOULDER WITHOUT CONTRAST TECHNIQUE: Multiplanar, multisequence MR imaging of the shoulder was performed. No intravenous contrast was administered. COMPARISON:  Left shoulder radiographs 06/28/2018 FINDINGS: Examination is quite limited due to the patient's size and motion. Rotator cuff: There is a large, full-thickness retracted supraspinatus tendon tear. Maximum retraction is 28 mm and the tear is approximately 22 mm wide. Moderate infraspinatus tendinopathy and mild subscapularis tendinopathy. Muscles: Diffuse fatty infiltrative changes involving all of the shoulder muscles. There is also moderate edema like signal change involving the rotator cuff muscles and the posterior deltoid musculature.  Possible muscle strains or tears. Biceps long head:  Intact Acromioclavicular Joint: Moderate to advanced degenerative changes with inferior spurring. Type 2 acromion. No lateral downsloping or undersurface spurring. Glenohumeral Joint: Moderate degenerative changes with moderate cartilage loss, joint space narrowing and early spurring changes. There is also mild subchondral cystic change involving the glenoid. There is a small joint effusion. No significant synovitis. Labrum:  No obvious labral tears but exam is quite limited for that. Bones:  No acute bony findings.  No fracture or bone lesions. Other: Expected fluid in the subacromial/subdeltoid bursa. IMPRESSION: 1. Full-thickness retracted supraspinatus tendon tear measuring 28 x 22 mm. The infraspinatus and subscapularis tendons are intact. 2. Diffuse fatty infiltrative changes involving all the shoulder muscles. There is also moderate edema in the rotator cuff muscles and also in the posterior deltoid muscle which could be muscle strains partial tears. 3. Intact long head biceps tendon and grossly normal glenoid labrum. 4. Moderate glenohumeral joint degenerative changes and moderate to advanced AC joint degenerative changes. Electronically Signed   By: Rudie Meyer M.D.   On: 06/30/2018 15:48   Dg Foot Complete Left  Result Date: 06/30/2018 CLINICAL DATA:  Left great toe ulcer. EXAM: LEFT FOOT - COMPLETE 3+ VIEW COMPARISON:  No recent. FINDINGS: No acute soft tissue bony abnormality identified. Degenerative changes noted about the left foot, most prominent about the first MTP joint. No radiopaque foreign body. IMPRESSION: No acute abnormality identified. Specifically no erosive abnormalities are identified. Diffuse degenerative change, most prominent the first MTP joint. Electronically Signed   By: Maisie Fus  Register   On: 06/30/2018 11:15     Assessment/Plan: Lumbar abscess (debrided 9-16) MSSA bacteremia AKI L supraspinatus cuff tear  Total  days of antibiotics: 2 (ancef)  For TEE today Place PIC when BCx clear, repeat BCx pending.  Would defer steroid injection.  Would have renal see pt due to increasing Cr.          Johny Sax MD, FACP Infectious Diseases (pager) [phone] [email or web address] 07/01/2018, 9:18 AM  LOS: 3 days

## 2018-07-01 NOTE — CV Procedure (Signed)
Procedure: TEE  Indication: Endocarditis  Sedation: Versed 6 mg IV, fentanyl 50 mcg IV  Findings: Please see echo section for full report.  Normal LV size with mild LV hypertrophy.  EF 60-65%.  Normal wall motion.  Normal RV size and systolic function.  Normal left atrial size, no LA appendage thrombus.  Normal right atrium.  No ASD or PFO by color doppler.  No significant tricuspid regurgitation, no TV vegetation.  No PV vegetation.  No significant mitral regurgitation, no MV vegetation.  Trileaflet aortic valve without stenosis or regurgitation, no vegetation.  Normal caliber thoracic aorta with mild plaque.   Impression: No endocarditis.   Marca AnconaDalton Precious Gilchrest 07/01/2018 12:31 PM

## 2018-07-01 NOTE — Consult Note (Signed)
WOC Nurse wound consult note NPWT first post op dressing changed [facility] in the presence of PA, [name] KiefJames [name] and the patient's wife.  [name] AndrewV. Smith RN assisting. Four pieces of black foam dressing removed from the wound bed.  Continuous bright red bleeding occurring from wound bed.  [name] [name], [place] observed.  He spoke via phone to Dr. Otelia SergeantNitka, and [facility]. Reason for Consult: VAC dressing change Wound type: Surgical lumbar incision Measurement: 10.5 cm x 3.5 cm x 8 cm.  Two pieces of black foam placed into the wound bed. Trac pad bridged to patient's left side. Wound bed: 100% pink, granlulating, bleeding tissue. Drainage:  As described.  No odor, no induration Periwound: intact except for two very small, superficial skin tears that occurred to the left side of the wound during tape/drape removal. Dressing procedure/placement/frequency: MWF VAC dressing changes by [facility] team.  If bleeding causes blockage of NPWT, I have entered an order for twice daily saline moistened gauze packings after discussing with [name] [name], [place]. Additionally, I have ordered a bariatric bed with air mattress.  [name], RN, MSN, CWOCN, CNS-BC, pager [phone]

## 2018-07-01 NOTE — Progress Notes (Signed)
Echocardiogram Echocardiogram Transesophageal has been performed.  [name] [name] 07/01/2018, 12:56 PM

## 2018-07-02 ENCOUNTER — Encounter (HOSPITAL_COMMUNITY): Payer: Self-pay | Admitting: Cardiology

## 2018-07-02 DIAGNOSIS — M4626 Osteomyelitis of vertebra, lumbar region: Secondary | ICD-10-CM

## 2018-07-02 LAB — COMPREHENSIVE METABOLIC PANEL
ALBUMIN: 1.9 g/dL — AB (ref 3.5–5.0)
ALK PHOS: 102 U/L (ref 38–126)
ALT: UNDETERMINED U/L (ref 0–44)
AST: UNDETERMINED U/L (ref 15–41)
Anion gap: 9 (ref 5–15)
BILIRUBIN TOTAL: UNDETERMINED mg/dL (ref 0.3–1.2)
BUN: 81 mg/dL — ABNORMAL HIGH (ref 6–20)
CALCIUM: 7.9 mg/dL — AB (ref 8.9–10.3)
CO2: 19 mmol/L — ABNORMAL LOW (ref 22–32)
CREATININE: 2.43 mg/dL — AB (ref 0.61–1.24)
Chloride: 112 mmol/L — ABNORMAL HIGH (ref 98–111)
GFR, EST AFRICAN AMERICAN: 32 mL/min — AB (ref >60)
GFR, EST NON AFRICAN AMERICAN: 27 mL/min — AB (ref >60)
Glucose, Bld: 96 mg/dL (ref 70–99)
POTASSIUM: 4.5 mmol/L (ref 3.5–5.1)
Sodium: 140 mmol/L (ref 135–145)
Total Protein: 5.6 g/dL — ABNORMAL LOW (ref 6.5–8.1)

## 2018-07-02 LAB — PROTEIN ELECTROPHORESIS, SERUM
A/G RATIO SPE: 0.6 — AB (ref 0.7–1.7)
ALPHA-2-GLOBULIN: 0.7 g/dL (ref 0.4–1.0)
Albumin ELP: 2.1 g/dL — ABNORMAL LOW (ref 2.9–4.4)
Alpha-1-Globulin: 0.5 g/dL — ABNORMAL HIGH (ref 0.0–0.4)
BETA GLOBULIN: 0.9 g/dL (ref 0.7–1.3)
GLOBULIN, TOTAL: 3.4 g/dL (ref 2.2–3.9)
Gamma Globulin: 1.3 g/dL (ref 0.4–1.8)
Total Protein ELP: 5.5 g/dL — ABNORMAL LOW (ref 6.0–8.5)

## 2018-07-02 LAB — CBC
HEMATOCRIT: 35.1 % — AB (ref 39.0–52.0)
HEMOGLOBIN: 11.4 g/dL — AB (ref 13.0–17.0)
MCH: 28.1 pg (ref 26.0–34.0)
MCHC: 32.5 g/dL (ref 30.0–36.0)
MCV: 86.5 fL (ref 78.0–100.0)
Platelets: 105 10*3/uL — ABNORMAL LOW (ref 150–400)
RBC: 4.06 MIL/uL — ABNORMAL LOW (ref 4.22–5.81)
RDW: 15.9 % — AB (ref 11.5–15.5)
WBC: 11.4 10*3/uL — ABNORMAL HIGH (ref 4.0–10.5)

## 2018-07-02 LAB — KAPPA/LAMBDA LIGHT CHAINS
Kappa free light chain: 129.1 mg/L — ABNORMAL HIGH (ref 3.3–19.4)
Kappa, lambda light chain ratio: 2.11 — ABNORMAL HIGH (ref 0.26–1.65)
Lambda free light chains: 61.1 mg/L — ABNORMAL HIGH (ref 5.7–26.3)

## 2018-07-02 LAB — URINALYSIS, COMPLETE (UACMP) WITH MICROSCOPIC
BILIRUBIN URINE: NEGATIVE
Glucose, UA: NEGATIVE mg/dL
Ketones, ur: NEGATIVE mg/dL
NITRITE: NEGATIVE
Protein, ur: 30 mg/dL — AB
Specific Gravity, Urine: 1.015 (ref 1.005–1.030)
pH: 6 (ref 5.0–8.0)

## 2018-07-02 LAB — PROTEIN / CREATININE RATIO, URINE
Creatinine, Urine: 62.26 mg/dL
Protein Creatinine Ratio: 1.14 mg/mg{Cre} — ABNORMAL HIGH (ref 0.00–0.15)
Total Protein, Urine: 71 mg/dL

## 2018-07-02 LAB — MPO/PR-3 (ANCA) ANTIBODIES
ANCA Proteinase 3: <3.5 U/mL (ref 0.0–3.5)
Myeloperoxidase Abs: <9 U/mL (ref 0.0–9.0)

## 2018-07-02 LAB — ANTISTREPTOLYSIN O TITER: ASO: 23 [IU]/mL (ref 0.0–200.0)

## 2018-07-02 LAB — C4 COMPLEMENT: Complement C4, Body Fluid: 27 mg/dL (ref 14–44)

## 2018-07-02 LAB — ALT: ALT: 16 U/L (ref 0–44)

## 2018-07-02 LAB — ANTI-DNA ANTIBODY, DOUBLE-STRANDED

## 2018-07-02 LAB — BILIRUBIN, TOTAL: Total Bilirubin: 0.7 mg/dL (ref 0.3–1.2)

## 2018-07-02 LAB — C3 COMPLEMENT: C3 COMPLEMENT: 116 mg/dL (ref 82–167)

## 2018-07-02 LAB — ANTINUCLEAR ANTIBODIES, IFA: ANA Ab, IFA: NEGATIVE

## 2018-07-02 LAB — COMPLEMENT, TOTAL

## 2018-07-02 LAB — AST: AST: 55 U/L — AB (ref 15–41)

## 2018-07-02 MED ORDER — SENNOSIDES-DOCUSATE SODIUM 8.6-50 MG PO TABS
1.0000 | ORAL_TABLET | Freq: Two times a day (BID) | ORAL | Status: DC
  Administered 2018-07-02 – 2018-07-08 (×9): 1 via ORAL
  Filled 2018-07-02 (×13): qty 1

## 2018-07-02 MED ORDER — POLYETHYLENE GLYCOL 3350 17 G PO PACK
17.0000 g | PACK | Freq: Every day | ORAL | Status: DC
  Administered 2018-07-02 – 2018-07-03 (×2): 17 g via ORAL
  Filled 2018-07-02 (×2): qty 1

## 2018-07-02 NOTE — Progress Notes (Signed)
Pt transferred from bariatric bed to standard hospital bed per Lestine MountJames Owen, PA. Pt tolerated well. Will continue to monitor.

## 2018-07-02 NOTE — Progress Notes (Signed)
S: Didn't sleep well last night.  "I want the old bed back" O:BP 129/74 (BP Location: Right Wrist)   Pulse 73   Temp 97.7 F (36.5 C) (Oral)   Resp 20   Ht 5\' 10"  (1.778 m)   Wt (!) 147.8 kg   SpO2 98%   BMI 46.75 kg/m   Intake/Output Summary (Last 24 hours) at 07/02/2018 1252 Last data filed at 07/02/2018 0900 Gross per 24 hour  Intake 840 ml  Output 2140 ml  Net -1300 ml   Intake/Output: I/O last 3 completed shifts: In: 5115.4 [P.O.:600; I.V.:2822.7; IV Piggyback:1692.7] Out: 2840 [Urine:2600; Drains:240]  Intake/Output this shift:  Total I/O In: 240 [P.O.:240] Out: 400 [Urine:400] Weight change:  Gen: obese AAM in NAD CVS: no rub Resp: cta Abd: obese, +BS, soft, NT Ext: no edema  Recent Labs  Lab 06/28/18 0002 06/29/18 0310 06/30/18 0452 07/01/18 0501 07/02/18 0318 07/02/18 0701  NA 141 141 141 137 140  --   K 4.0 3.6 3.5 3.8 4.5  --   CL 101 107 108 104 112*  --   CO2 27 24 22  21* 19*  --   GLUCOSE 147* 187* 158* 138* 96  --   BUN 26* 42* 58* 79* 81*  --   CREATININE 1.77* 2.12* 2.47* 2.99* 2.43*  --   ALBUMIN 3.0* 2.6*  --   --  1.9*  --   CALCIUM 9.0 8.6* 8.4* 8.1* 7.9*  --   AST 47* 59*  --   --  QUANTITY NOT SUFFICIENT, UNABLE TO PERFORM TEST 55*  ALT 21 28  --   --  QUANTITY NOT SUFFICIENT, UNABLE TO PERFORM TEST 16   Liver Function Tests: Recent Labs  Lab 06/28/18 0002 06/29/18 0310 07/02/18 0318 07/02/18 0701  AST 47* 59* QUANTITY NOT SUFFICIENT, UNABLE TO PERFORM TEST 55*  ALT 21 28 QUANTITY NOT SUFFICIENT, UNABLE TO PERFORM TEST 16  ALKPHOS 93 99 102  --   BILITOT 1.7* 1.6* QUANTITY NOT SUFFICIENT, UNABLE TO PERFORM TEST 0.7  PROT 6.9 6.7 5.6*  --   ALBUMIN 3.0* 2.6* 1.9*  --    No results for input(s): LIPASE, AMYLASE in the last 168 hours. No results for input(s): AMMONIA in the last 168 hours. CBC: Recent Labs  Lab 06/28/18 0002 06/29/18 0310 06/30/18 0452 07/02/18 0318  WBC 13.5* 12.6* 13.7* 11.4*  NEUTROABS 12.5*  --   --    --   HGB 14.2 13.5 12.5* 11.4*  HCT 44.5 42.6 38.9* 35.1*  MCV 89.4 88.4 87.4 86.5  PLT 137* 144* 124* 105*   Cardiac Enzymes: Recent Labs  Lab 07/01/18 1651  CKTOTAL 89   CBG: No results for input(s): GLUCAP in the last 168 hours.  Iron Studies: No results for input(s): IRON, TIBC, TRANSFERRIN, FERRITIN in the last 72 hours. Studies/Results: Mr Shoulder Left Wo Contrast  Result Date: 06/30/2018 CLINICAL DATA:  Larey Seat 1 week ago.  Left shoulder pain. EXAM: MRI OF THE LEFT SHOULDER WITHOUT CONTRAST TECHNIQUE: Multiplanar, multisequence MR imaging of the shoulder was performed. No intravenous contrast was administered. COMPARISON:  Left shoulder radiographs 06/28/2018 FINDINGS: Examination is quite limited due to the patient's size and motion. Rotator cuff: There is a large, full-thickness retracted supraspinatus tendon tear. Maximum retraction is 28 mm and the tear is approximately 22 mm wide. Moderate infraspinatus tendinopathy and mild subscapularis tendinopathy. Muscles: Diffuse fatty infiltrative changes involving all of the shoulder muscles. There is also moderate edema like signal change involving the  rotator cuff muscles and the posterior deltoid musculature. Possible muscle strains or tears. Biceps long head:  Intact Acromioclavicular Joint: Moderate to advanced degenerative changes with inferior spurring. Type 2 acromion. No lateral downsloping or undersurface spurring. Glenohumeral Joint: Moderate degenerative changes with moderate cartilage loss, joint space narrowing and early spurring changes. There is also mild subchondral cystic change involving the glenoid. There is a small joint effusion. No significant synovitis. Labrum:  No obvious labral tears but exam is quite limited for that. Bones:  No acute bony findings.  No fracture or bone lesions. Other: Expected fluid in the subacromial/subdeltoid bursa. IMPRESSION: 1. Full-thickness retracted supraspinatus tendon tear measuring 28 x 22  mm. The infraspinatus and subscapularis tendons are intact. 2. Diffuse fatty infiltrative changes involving all the shoulder muscles. There is also moderate edema in the rotator cuff muscles and also in the posterior deltoid muscle which could be muscle strains partial tears. 3. Intact long head biceps tendon and grossly normal glenoid labrum. 4. Moderate glenohumeral joint degenerative changes and moderate to advanced AC joint degenerative changes. Electronically Signed   By: Rudie MeyerP.  Gallerani M.D.   On: 06/30/2018 15:48   . acetaminophen  1,000 mg Oral Q6H  . calcium-vitamin D  1 tablet Oral Daily  . DULoxetine  60 mg Oral Daily  . enoxaparin (LOVENOX) injection  30 mg Subcutaneous Q24H  . HYDROcodone-acetaminophen  1 tablet Oral Q6H  . pantoprazole  40 mg Oral BID AC  . polyethylene glycol  17 g Oral Daily  . senna-docusate  1 tablet Oral BID  . sodium chloride flush  3 mL Intravenous Q12H  . tamsulosin  0.4 mg Oral Daily  . vitamin B-12  1,000 mcg Oral Daily    BMET    Component Value Date/Time   NA 140 07/02/2018 0318   K 4.5 07/02/2018 0318   CL 112 (H) 07/02/2018 0318   CO2 19 (L) 07/02/2018 0318   GLUCOSE 96 07/02/2018 0318   BUN 81 (H) 07/02/2018 0318   CREATININE 2.43 (H) 07/02/2018 0318   CREATININE 0.86 03/26/2013 0907   CALCIUM 7.9 (L) 07/02/2018 0318   GFRNONAA 27 (L) 07/02/2018 0318   GFRAA 32 (L) 07/02/2018 0318   CBC    Component Value Date/Time   WBC 11.4 (H) 07/02/2018 0318   RBC 4.06 (L) 07/02/2018 0318   HGB 11.4 (L) 07/02/2018 0318   HCT 35.1 (L) 07/02/2018 0318   PLT 105 (L) 07/02/2018 0318   MCV 86.5 07/02/2018 0318   MCH 28.1 07/02/2018 0318   MCHC 32.5 07/02/2018 0318   RDW 15.9 (H) 07/02/2018 0318   LYMPHSABS 0.3 (L) 06/28/2018 0002   MONOABS 0.7 06/28/2018 0002   EOSABS 0.0 06/28/2018 0002   BASOSABS 0.0 06/28/2018 0002     Assessment/Plan: 1.  AKI/CKD stage 2 (presumably due to diabetic nephropathy)- in setting of MSSA bacteremia and lumbar  abscess.  He had been receiving NSAIDs prior to admission and had some nephrotoxic agents at admission but none since.  His FeNa was <1% consistent with pre-renal but could also be seen in acute GN.   1. Responding to increased IVF"s at 13050ml/hr  2. acute GN workup pending, negative ASO and normal complements.   3. Likely had some NSAID nephropathy upon admission which worsened with surgery and acute illness and urinary retention.   4. No indication for HD at this time and will continue to follow.   5. Renal dose meds and avoid nephrotoxic agents  2. MSSA bacteremia/septic arthritis/lumbar  epidural abscess- on Ancef per ID and TEE negative 3. Epidural abscess wound- will require wound vac, WOC nurse consulted 4. Urinary retention- due to epidural abscess s/p foley catheter without obstruction 5. Nephrolithiasis- non-obstructing stones 6. DM- poorly controlled, insulin per primary svc 7. Morbid obesity 8. HTN- avoid ACE/ARB for now 9. Proteinuria- will check prot/creat ratio and check SPEP/UPEP 10. Moderate protein malnutrition 11. OSA not treated with CPAP  Irena Cords, MD [facility] [phone]

## 2018-07-02 NOTE — Progress Notes (Signed)
Consultation PROGRESS NOTE  Scot Junrnest Fuelling ZOX:[id number] DOB: [birth year] DOA: 06/27/2018 PCP: Renford DillsPolite, Ronald, MD  HPI/Recap of past 24 hours:  Cr improving, 1.9 liter urine output last 24hrs bp stable, no fever   He reports being constipated He does not feel comfortable in the bed, c/o back pain    Wife at bedside    Assessment/Plan: Principal Problem:   Spondylosis, thoracic, with myelopathy Active Problems:   Thoracic myelopathy   Abscess in epidural space of L2-L5 lumbar spine   Acute urinary retention   Left cervical radiculopathy   Leukocytosis   Postoperative seroma involving nervous system after nervous system procedure   Shoulder pain, bilateral   Septic arthritis of knee, left (HCC)  Sepsis secondary to MSSA bacteremia and lumbar epidural abscess (presenting symptom). -s/pLUMBAR WOUND DEBRIDEMENT DRAINAGE AND IRRIGATION on 9/17 -TEE no endocarditis - WBC improving.  Afebrile.  Blood culture pansensitive.  Continue Ancef.  Antibiotic duration per ID.  -plan per ID and primary team  Septic arthritis, left knee S/p ARTHROSCOPY KNEE UTILIZING PORTALS FOR IRRIGATION AND DRAINAGE OF LEFT KNEE PYARTHROSIS. Left knee fluids culture+ MSSA pan sensitivy Plan per ID and primary team  Left shoulder pain -mri left shoulder "Full-thickness retracted supraspinatus tendon tear measuring 28 x 22 mm. " -plan per primary team  Left wrist swelling Has some tenderness but no obvious joint tenderness.  X-ray shows soft tissue swelling only.  If symptoms persist and may need synovial aspiration of the joint by primary.  cervical and thoracic myelopathy.  Seen by neurosurgery Dr Jordan LikesPool who recommends some form of decompressive surgery once acute issues have resolved.   AKI on CKDII Renal us no obstruction He did has urinary retention on admission with required foley placement, foley still in place  He has been on ns 125cc/hr since 9/15 He received one liter ns bolus on  9/18 Renal function continue to worse, nephrology Dr Lewis Moccasinoladonado consulted, patient ivf increased to 150cc/hr per nephrology recommendaion. Cr appear improving, will follow nephrology recommendation  Urinary retention Foley placed on admission, he is started on flomax  Constipation: start senokot and miralax  Morbid obesity: Body mass index is 46.75 kg/m.   Code Status: full  Family Communication: patient and wife  Disposition Plan: per primary team   Consultants:  [facility]  Infectious disease   nephrology  Procedures:  As above  Antibiotics:  As above   Objective: BP 129/74 (BP Location: Right Wrist)   Pulse 73   Temp 97.7 F (36.5 C) (Oral)   Resp 20   Ht 5\' 10"  (1.778 m)   Wt (!) 147.8 kg   SpO2 98%   BMI 46.75 kg/m   Intake/Output Summary (Last 24 hours) at 07/02/2018 1210 Last data filed at 07/02/2018 0900 Gross per 24 hour  Intake 1340 ml  Output 2140 ml  Net -800 ml   Filed Weights   06/28/18 1424  Weight: (!) 147.8 kg    Exam: Patient is examined daily including today on 07/02/2018, exams remain the same as of yesterday except that has changed    General:  NAD, obese, wound vac to back  Cardiovascular: RRR  Respiratory: CTABL  Abdomen: Soft/ND/NT, positive BS  Musculoskeletal:  no significant  Edema  Neuro: alert, oriented   Data Reviewed: Basic Metabolic Panel: Recent Labs  Lab 06/28/18 0002 06/29/18 0310 06/30/18 0452 07/01/18 0501 07/02/18 0318  NA 141 141 141 137 140  K 4.0 3.6 3.5 3.8 4.5  CL 101 107 108 104  112*  CO2 27 24 22  21* 19*  GLUCOSE 147* 187* 158* 138* 96  BUN 26* 42* 58* 79* 81*  CREATININE 1.77* 2.12* 2.47* 2.99* 2.43*  CALCIUM 9.0 8.6* 8.4* 8.1* 7.9*   Liver Function Tests: Recent Labs  Lab 06/28/18 0002 06/29/18 0310 07/02/18 0318 07/02/18 0701  AST 47* 59* QUANTITY NOT SUFFICIENT, UNABLE TO PERFORM TEST 55*  ALT 21 28 QUANTITY NOT SUFFICIENT, UNABLE TO PERFORM TEST 16  ALKPHOS 93  99 102  --   BILITOT 1.7* 1.6* QUANTITY NOT SUFFICIENT, UNABLE TO PERFORM TEST 0.7  PROT 6.9 6.7 5.6*  --   ALBUMIN 3.0* 2.6* 1.9*  --    No results for input(s): LIPASE, AMYLASE in the last 168 hours. No results for input(s): AMMONIA in the last 168 hours. CBC: Recent Labs  Lab 06/28/18 0002 06/29/18 0310 06/30/18 0452 07/02/18 0318  WBC 13.5* 12.6* 13.7* 11.4*  NEUTROABS 12.5*  --   --   --   HGB 14.2 13.5 12.5* 11.4*  HCT 44.5 42.6 38.9* 35.1*  MCV 89.4 88.4 87.4 86.5  PLT 137* 144* 124* 105*   Cardiac Enzymes:   Recent Labs  Lab 07/01/18 1651  CKTOTAL 89   BNP (last 3 results) No results for input(s): BNP in the last 8760 hours.  ProBNP (last 3 results) No results for input(s): PROBNP in the last 8760 hours.  CBG: No results for input(s): GLUCAP in the last 168 hours.  Recent Results (from the past 240 hour(s))  Culture, Urine     Status: Abnormal   Collection Time: 06/28/18 11:59 AM  Result Value Ref Range Status   Specimen Description URINE, CATHETERIZED  Final   Special Requests   Final    Normal Performed at [facility], [address] [place], [place] [address]    Culture MULTIPLE SPECIES PRESENT, SUGGEST RECOLLECTION (A)  Final   Report Status 06/29/2018 FINAL  Final  Culture, blood (routine x 2)     Status: Abnormal   Collection Time: 06/28/18  3:30 PM  Result Value Ref Range Status   Specimen Description BLOOD LEFT ANTECUBITAL  Final   Special Requests   Final    BOTTLES DRAWN AEROBIC AND ANAEROBIC Blood Culture adequate volume   Culture  Setup Time   Final    GRAM POSITIVE COCCI IN BOTH AEROBIC AND ANAEROBIC BOTTLES CRITICAL RESULT CALLED TO, READ BACK BY AND VERIFIED WITH: G. ABBOTT PHARMD, AT 0724 06/29/18 BY D. VANHOOK    Culture (A)  Final    STAPHYLOCOCCUS AUREUS SUSCEPTIBILITIES PERFORMED ON PREVIOUS CULTURE WITHIN THE LAST 5 DAYS. Performed at [facility], [address] [place], [place] [address]    Report Status  07/01/2018 FINAL  Final  Culture, blood (routine x 2)     Status: Abnormal   Collection Time: 06/28/18  3:35 PM  Result Value Ref Range Status   Specimen Description BLOOD LEFT HAND  Final   Special Requests   Final    BOTTLES DRAWN AEROBIC AND ANAEROBIC Blood Culture adequate volume   Culture  Setup Time   Final    GRAM POSITIVE COCCI IN BOTH AEROBIC AND ANAEROBIC BOTTLES CRITICAL RESULT CALLED TO, READ BACK BY AND VERIFIED WITH: G. ABBOTT PHARMD, AT 0724 06/29/18 BY D. VANHOOK    Culture (A)  Final    STAPHYLOCOCCUS AUREUS BACILLUS SPECIES Standardized susceptibility testing for this organism is not available. CRITICAL RESULT CALLED TO, READ BACK BY AND VERIFIED WITH: N. Cora Collum  PHARMD, AT 1519 06/30/18 BY D. VANHOOK REGARDING CULTURE GROWTH Performed at [facility] Lab, [address] [place], [place] [address]    Report Status 07/01/2018 FINAL  Final   Organism ID, Bacteria STAPHYLOCOCCUS AUREUS  Final      Susceptibility   Staphylococcus aureus - MIC*    CIPROFLOXACIN <=0.5 SENSITIVE Sensitive     ERYTHROMYCIN <=0.25 SENSITIVE Sensitive     GENTAMICIN <=0.5 SENSITIVE Sensitive     OXACILLIN 0.5 SENSITIVE Sensitive     TETRACYCLINE <=1 SENSITIVE Sensitive     VANCOMYCIN <=0.5 SENSITIVE Sensitive     TRIMETH/SULFA <=10 SENSITIVE Sensitive     CLINDAMYCIN <=0.25 SENSITIVE Sensitive     RIFAMPIN <=0.5 SENSITIVE Sensitive     Inducible Clindamycin NEGATIVE Sensitive     * STAPHYLOCOCCUS AUREUS  Blood Culture ID Panel (Reflexed)     Status: Abnormal   Collection Time: 06/28/18  3:35 PM  Result Value Ref Range Status   Enterococcus species NOT DETECTED NOT DETECTED Final   Listeria monocytogenes NOT DETECTED NOT DETECTED Final   Staphylococcus species DETECTED (A) NOT DETECTED Final    Comment: CRITICAL RESULT CALLED TO, READ BACK BY AND VERIFIED WITH: G. ABBOTT PHARMD, AT 7829 06/29/18 BY D. VANHOOK    Staphylococcus aureus DETECTED (A) NOT DETECTED Final    Comment:  Methicillin (oxacillin) susceptible Staphylococcus aureus (MSSA). Preferred therapy is anti staphylococcal beta lactam antibiotic (Cefazolin or Nafcillin), unless clinically contraindicated. CRITICAL RESULT CALLED TO, READ BACK BY AND VERIFIED WITH: G. ABBOTT PHARMD, AT 0724 06/29/18 BY D. VANHOOK    Methicillin resistance NOT DETECTED NOT DETECTED Final   Streptococcus species NOT DETECTED NOT DETECTED Final   Streptococcus agalactiae NOT DETECTED NOT DETECTED Final   Streptococcus pneumoniae NOT DETECTED NOT DETECTED Final   Streptococcus pyogenes NOT DETECTED NOT DETECTED Final   Acinetobacter baumannii NOT DETECTED NOT DETECTED Final   Enterobacteriaceae species NOT DETECTED NOT DETECTED Final   Enterobacter cloacae complex NOT DETECTED NOT DETECTED Final   Escherichia coli NOT DETECTED NOT DETECTED Final   Klebsiella oxytoca NOT DETECTED NOT DETECTED Final   Klebsiella pneumoniae NOT DETECTED NOT DETECTED Final   Proteus species NOT DETECTED NOT DETECTED Final   Serratia marcescens NOT DETECTED NOT DETECTED Final   Haemophilus influenzae NOT DETECTED NOT DETECTED Final   Neisseria meningitidis NOT DETECTED NOT DETECTED Final   Pseudomonas aeruginosa NOT DETECTED NOT DETECTED Final   Candida albicans NOT DETECTED NOT DETECTED Final   Candida glabrata NOT DETECTED NOT DETECTED Final   Candida krusei NOT DETECTED NOT DETECTED Final   Candida parapsilosis NOT DETECTED NOT DETECTED Final   Candida tropicalis NOT DETECTED NOT DETECTED Final    Comment: Performed at [facility], [address] [place], [place] [address]  Aerobic/Anaerobic Culture (surgical/deep wound)     Status: None (Preliminary result)   Collection Time: 06/28/18  5:01 PM  Result Value Ref Range Status   Specimen Description ABSCESS LUMBAR  Final   Special Requests NONE  Final   Gram Stain   Final    ABUNDANT WBC PRESENT, PREDOMINANTLY PMN ABUNDANT GRAM POSITIVE COCCI IN PAIRS IN CLUSTERS Performed at [facility], [address] [place], [place] [address]    Culture   Final    MODERATE STAPHYLOCOCCUS AUREUS NO ANAEROBES ISOLATED; CULTURE IN PROGRESS FOR 5 DAYS    Report Status PENDING  Incomplete   Organism ID, Bacteria STAPHYLOCOCCUS AUREUS  Final  Susceptibility   Staphylococcus aureus - MIC*    CIPROFLOXACIN <=0.5 SENSITIVE Sensitive     ERYTHROMYCIN <=0.25 SENSITIVE Sensitive     GENTAMICIN <=0.5 SENSITIVE Sensitive     OXACILLIN <=0.25 SENSITIVE Sensitive     TETRACYCLINE <=1 SENSITIVE Sensitive     VANCOMYCIN <=0.5 SENSITIVE Sensitive     TRIMETH/SULFA <=10 SENSITIVE Sensitive     CLINDAMYCIN <=0.25 SENSITIVE Sensitive     RIFAMPIN <=0.5 SENSITIVE Sensitive     Inducible Clindamycin NEGATIVE Sensitive     * MODERATE STAPHYLOCOCCUS AUREUS  MRSA PCR Screening     Status: None   Collection Time: 06/29/18  4:14 PM  Result Value Ref Range Status   MRSA by PCR NEGATIVE NEGATIVE Final    Comment:        The GeneXpert MRSA Assay (FDA approved for NASAL specimens only), is one component of a comprehensive MRSA colonization surveillance program. It is not intended to diagnose MRSA infection nor to guide or monitor treatment for MRSA infections. Performed at [facility], [address], [place], [place] [address]   Aerobic/Anaerobic Culture (surgical/deep wound)     Status: None (Preliminary result)   Collection Time: 06/29/18  6:08 PM  Result Value Ref Range Status   Specimen Description FLUID LEFT KNEE  Final   Special Requests NONE  Final   Gram Stain   Final    ABUNDANT WBC PRESENT, PREDOMINANTLY PMN RARE GRAM POSITIVE COCCI RESULT CALLED TO, READ BACK BY AND VERIFIED WITH: DR Otelia Sergeant 06/29/18 2012 Performed at [facility], [address], [place], [place] [address]    Culture   Final    RARE STAPHYLOCOCCUS AUREUS NO ANAEROBES ISOLATED; CULTURE IN PROGRESS FOR 5 DAYS    Report Status PENDING  Incomplete   Organism ID, Bacteria STAPHYLOCOCCUS  AUREUS  Final      Susceptibility   Staphylococcus aureus - MIC*    CIPROFLOXACIN <=0.5 SENSITIVE Sensitive     ERYTHROMYCIN <=0.25 SENSITIVE Sensitive     GENTAMICIN <=0.5 SENSITIVE Sensitive     OXACILLIN 0.5 SENSITIVE Sensitive     TETRACYCLINE <=1 SENSITIVE Sensitive     VANCOMYCIN <=0.5 SENSITIVE Sensitive     TRIMETH/SULFA <=10 SENSITIVE Sensitive     CLINDAMYCIN <=0.25 SENSITIVE Sensitive     RIFAMPIN <=0.5 SENSITIVE Sensitive     Inducible Clindamycin NEGATIVE Sensitive     * RARE STAPHYLOCOCCUS AUREUS  Aerobic/Anaerobic Culture (surgical/deep wound)     Status: None (Preliminary result)   Collection Time: 06/29/18  7:31 PM  Result Value Ref Range Status   Specimen Description ABSCESS  Final   Special Requests LUMBAR ABSCESS  Final   Gram Stain   Final    ABUNDANT WBC PRESENT,BOTH PMN AND MONONUCLEAR ABUNDANT GRAM POSITIVE COCCI Performed at [facility], [address], [place], [place] [address]    Culture   Final    ABUNDANT STAPHYLOCOCCUS AUREUS NO ANAEROBES ISOLATED; CULTURE IN PROGRESS FOR 5 DAYS    Report Status PENDING  Incomplete   Organism ID, Bacteria STAPHYLOCOCCUS AUREUS  Final      Susceptibility   Staphylococcus aureus - MIC*    CIPROFLOXACIN <=0.5 SENSITIVE Sensitive     ERYTHROMYCIN <=0.25 SENSITIVE Sensitive     GENTAMICIN <=0.5 SENSITIVE Sensitive     OXACILLIN 0.5 SENSITIVE Sensitive     TETRACYCLINE <=1 SENSITIVE Sensitive     VANCOMYCIN 1 SENSITIVE Sensitive     TRIMETH/SULFA <=10 SENSITIVE Sensitive  CLINDAMYCIN <=0.25 SENSITIVE Sensitive     RIFAMPIN <=0.5 SENSITIVE Sensitive     Inducible Clindamycin NEGATIVE Sensitive     * ABUNDANT STAPHYLOCOCCUS AUREUS  Culture, blood (routine x 2)     Status: None (Preliminary result)   Collection Time: 06/30/18  4:52 AM  Result Value Ref Range Status   Specimen Description BLOOD LEFT ARM  Final   Special Requests   Final    BOTTLES DRAWN AEROBIC AND ANAEROBIC Blood Culture adequate volume     Culture   Final    NO GROWTH 2 DAYS Performed at [facility], [address], [place], [place] [address]    Report Status PENDING  Incomplete  Culture, blood (routine x 2)     Status: None (Preliminary result)   Collection Time: 06/30/18  4:55 AM  Result Value Ref Range Status   Specimen Description BLOOD LEFT WRIST  Final   Special Requests   Final    BOTTLES DRAWN AEROBIC AND ANAEROBIC Blood Culture adequate volume   Culture   Final    NO GROWTH 2 DAYS Performed at [facility], [address], [place], [place] [address]    Report Status PENDING  Incomplete     Studies: No results found.  Scheduled Meds: . acetaminophen  1,000 mg Oral Q6H  . calcium-vitamin D  1 tablet Oral Daily  . docusate sodium  100 mg Oral BID  . DULoxetine  60 mg Oral Daily  . enoxaparin (LOVENOX) injection  30 mg Subcutaneous Q24H  . HYDROcodone-acetaminophen  1 tablet Oral Q6H  . pantoprazole  40 mg Oral BID AC  . sodium chloride flush  3 mL Intravenous Q12H  . tamsulosin  0.4 mg Oral Daily  . vitamin B-12  1,000 mcg Oral Daily    Continuous Infusions: . sodium chloride 150 mL/hr at 07/02/18 1018  . sodium chloride    .  ceFAZolin (ANCEF) IV 2 g (07/02/18 0528)  . methocarbamol (ROBAXIN) IV       Time spent: 25 mins I have personally reviewed and interpreted on  07/02/2018 daily labs, imagings as discussed above under date review session and assessment and plans.  I reviewed all nursing notes, pharmacy notes, consultant notes,  vitals, pertinent old records  I have discussed plan of care as described above with RN , patient and family on 07/02/2018   Albertine Grates MD, PhD  [facility] Pager [phone]. If 7PM-7AM, please contact night-coverage at [email or web address], password [facility] 07/02/2018, 12:10 PM  LOS: 4 days

## 2018-07-02 NOTE — Progress Notes (Signed)
Patient complains of back pain.  No new lower extremity symptoms.  Status post I&D of lumbar wound.  Continue IV antibiotics.  No plans for intervention with regard to his cervical or thoracic spine until lumbar issues are resolved.

## 2018-07-02 NOTE — Evaluation (Signed)
Physical Therapy Evaluation Patient Details Name: [name] [name] MRN: [id number] DOB: [birth year] Today's Date: 07/02/2018   History of Present Illness  [name] [name] is a 62 y.o. male admitted secondary to profound bilateral LE weakness. Pt with multiple diagnoses including thoracic myelopathy, flaccid paralysic both legs, left cervical radiculopathy, acute spinal cord injury; Pt is now s/p LUMBAR WOUND DEBRIDEMENT DRAINAGE AND IRRIGATION; AND ASPIRATION OF LEFT KNEE ARTHROSCOPY KNEE (Left).    Clinical Impression  Pt presented supine in bed with HOB elevated, awake and willing to participate in therapy session. Prior to admission, pt reported that he was previously ambulating with use of RW, independent with ADLs and was participating in OP PT. Pt lives with his wife in a single level home with a level entry. His wife is available to provide 24/7 supervision/assistance upon d/c. Pt currently requires max-total A x2-3 for bed mobility and attempted transfers. Pt would currently require use of a mechanical lift for safety with any transfers OOB. Pt would continue to benefit from skilled physical therapy services at this time while admitted and after d/c to address the below listed limitations in order to improve overall safety and independence with functional mobility.     Follow Up Recommendations SNF;Supervision/Assistance - 24 hour    Equipment Recommendations  None recommended by PT    Recommendations for Other Services       Precautions / Restrictions Precautions Precautions: Back;Fall Precaution Booklet Issued: No Precaution Comments: wound VAC; reviewed back precautions and log roll with pt Restrictions Weight Bearing Restrictions: No      Mobility  Bed Mobility Overal bed mobility: Needs Assistance Bed Mobility: Rolling;Sidelying to Sit;Sit to Supine Rolling: Max assist;+2 for physical assistance Sidelying to sit: Max assist;+2 for physical assistance(+3)   Sit to supine:  Total assist;+2 for physical assistance(+3)   General bed mobility comments: max-total A x2-3 for all aspects with use of bed pads when able. cueing for use of log roll technique  Transfers Overall transfer level: Needs assistance Equipment used: Rolling walker (2 wheeled) Transfers: Sit to/from Stand Sit to Stand: From elevated surface;+2 physical assistance;Max assist         General transfer comment: pt attempted x2 to achieve full erect standing position from EOB; however, pt with minimal clearance of buttocks from bed even with max A x2-3 people; pt very limited secondary to pain and weakness  Ambulation/Gait             General Gait Details: unable  Stairs            Wheelchair Mobility    Modified Rankin (Stroke Patients Only)       Balance Overall balance assessment: Needs assistance Sitting-balance support: Feet supported;Single extremity supported;Bilateral upper extremity supported Sitting balance-Leahy Scale: Poor     Standing balance support: Bilateral upper extremity supported Standing balance-Leahy Scale: Poor                               Pertinent Vitals/Pain Pain Assessment: 0-10 Pain Score: 8  Pain Location: back and L knee Pain Descriptors / Indicators: Grimacing;Sore;Tingling Pain Intervention(s): Monitored during session;Repositioned    Home Living Family/patient expects to be discharged to:: Private residence Living Arrangements: Spouse/significant other Available Help at Discharge: Family;Available 24 hours/day Type of Home: House Home Access: Level entry     Home Layout: One level Home Equipment: Walker - 2 wheels;Bedside commode;Wheelchair - manual;Shower seat;Grab bars - tub/shower;Hand held shower head  Prior Function Level of Independence: Needs assistance   Gait / Transfers Assistance Needed: amb with rolling walker. working with [employer]  ADL's / Homemaking Assistance Needed: reports independent         Hand Dominance   Dominant Hand: Left("ambidextrous")    Extremity/Trunk Assessment   Upper Extremity Assessment Upper Extremity Assessment: Defer to OT evaluation LUE Deficits / Details: limited FF - MRI revealed rotator cuff complete tear LUE: Unable to fully assess due to pain LUE Sensation: WNL LUE Coordination: decreased fine motor;decreased gross motor    Lower Extremity Assessment Lower Extremity Assessment: Generalized weakness    Cervical / Trunk Assessment Cervical / Trunk Assessment: Other exceptions Cervical / Trunk Exceptions: body habitus and wound VAC on Lumbar spine  Communication   Communication: No difficulties  Cognition Arousal/Alertness: Awake/alert Behavior During Therapy: WFL for tasks assessed/performed Overall Cognitive Status: Within Functional Limits for tasks assessed                                        General Comments      Exercises     Assessment/Plan    PT Assessment Patient needs continued PT services  PT Problem List Decreased strength;Decreased activity tolerance;Decreased balance;Decreased range of motion;Decreased mobility;Decreased coordination;Decreased knowledge of use of DME;Decreased safety awareness;Decreased knowledge of precautions;Pain       PT Treatment Interventions DME instruction;Gait training;Stair training;Functional mobility training;Therapeutic activities;Therapeutic exercise;Neuromuscular re-education;Balance training;Patient/family education    PT Goals (Current goals can be found in the Care Plan section)  Acute Rehab PT Goals Patient Stated Goal: to walk in the parallel bars at OP PT PT Goal Formulation: With patient Time For Goal Achievement: 07/16/18 Potential to Achieve Goals: Good    Frequency Min 3X/week   Barriers to discharge        Co-evaluation PT/OT/SLP Co-Evaluation/Treatment: Yes Reason for Co-Treatment: Complexity of the patient's impairments (multi-system  involvement);For patient/therapist safety;To address functional/ADL transfers PT goals addressed during session: Mobility/safety with mobility;Balance;Proper use of DME;Strengthening/ROM OT goals addressed during session: ADL's and self-care;Strengthening/ROM       AM-PAC PT "6 Clicks" Daily Activity  Outcome Measure Difficulty turning over in bed (including adjusting bedclothes, sheets and blankets)?: Unable Difficulty moving from lying on back to sitting on the side of the bed? : Unable Difficulty sitting down on and standing up from a chair with arms (e.g., wheelchair, bedside commode, etc,.)?: Unable Help needed moving to and from a bed to chair (including a wheelchair)?: Total Help needed walking in hospital room?: Total Help needed climbing 3-5 steps with a railing? : Total 6 Click Score: 6    End of Session Equipment Utilized During Treatment: Gait belt Activity Tolerance: Patient limited by fatigue;Patient limited by pain Patient left: in bed;with call bell/phone within reach;with family/visitor present Nurse Communication: Mobility status;Need for lift equipment PT Visit Diagnosis: Other abnormalities of gait and mobility (R26.89);Pain Pain - Right/Left: Left Pain - part of body: Knee(and back)    Time: [id number] PT Time Calculation (min) (ACUTE ONLY): 29 min   Charges:   PT Evaluation $PT Eval Moderate Complexity: 1 Mod          Deborah ChalkJennifer Amorina Doerr, PT, DPT  Acute Rehabilitation Services Pager [phone] Office [phone]    Alessandra BevelsJennifer M Anitha Kreiser 07/02/2018, 3:38 PM

## 2018-07-02 NOTE — Progress Notes (Addendum)
INFECTIOUS DISEASE PROGRESS NOTE  ID: [name] [name] is a 62 y.o. male with  Principal Problem:   Spondylosis, thoracic, with myelopathy Active Problems:   Thoracic myelopathy   Abscess in epidural space of L2-L5 lumbar spine   Acute urinary retention   Left cervical radiculopathy   Leukocytosis   Postoperative seroma involving nervous system after nervous system procedure   Shoulder pain, bilateral   Septic arthritis of knee, left (HCC)  Subjective: C/o back pain, uncomfortable in new bed.   Abtx:  Anti-infectives (From admission, onward)   Start     Dose/Rate Route Frequency Ordered Stop   06/29/18 1936  vancomycin (VANCOCIN) powder  Status:  Discontinued       As needed 06/29/18 1936 06/29/18 2121   06/29/18 1928  polymyxin B 500,000 Units, bacitracin 50,000 Units in sodium chloride 0.9 % 500 mL irrigation  Status:  Discontinued       As needed 06/29/18 1928 06/29/18 2121   06/29/18 1800  vancomycin (VANCOCIN) IVPB 1000 mg/200 mL premix  Status:  Discontinued     1,000 mg 200 mL/hr over 60 Minutes Intravenous  Once 06/29/18 0032 06/29/18 1042   06/29/18 1400  ceFAZolin (ANCEF) IVPB 2g/100 mL premix     2 g 200 mL/hr over 30 Minutes Intravenous Every 8 hours 06/29/18 1042     06/29/18 0200  ceFEPIme (MAXIPIME) 2 g in sodium chloride 0.9 % 100 mL IVPB  Status:  Discontinued     2 g 200 mL/hr over 30 Minutes Intravenous Every 8 hours 06/29/18 0031 06/29/18 1042   06/29/18 0130  vancomycin (VANCOCIN) 2,000 mg in sodium chloride 0.9 % 500 mL IVPB     2,000 mg 250 mL/hr over 120 Minutes Intravenous  Once 06/29/18 0032 06/29/18 0257      Medications:  Scheduled: . acetaminophen  1,000 mg Oral Q6H  . calcium-vitamin D  1 tablet Oral Daily  . docusate sodium  100 mg Oral BID  . DULoxetine  60 mg Oral Daily  . enoxaparin (LOVENOX) injection  30 mg Subcutaneous Q24H  . HYDROcodone-acetaminophen  1 tablet Oral Q6H  . pantoprazole  40 mg Oral BID AC  . sodium chloride flush   3 mL Intravenous Q12H  . tamsulosin  0.4 mg Oral Daily  . vitamin B-12  1,000 mcg Oral Daily    Objective: Vital signs in last 24 hours: Temp:  [97.5 F (36.4 C)-98.7 F (37.1 C)] 97.7 F (36.5 C) (09/19 0530) Pulse Rate:  [73-88] 73 (09/19 0530) Resp:  [11-22] 20 (09/19 0530) BP: (124-184)/(68-107) 129/74 (09/19 0530) SpO2:  [97 %-100 %] 98 % (09/19 0530)   General appearance: alert, cooperative and moderate distress Resp: clear to auscultation bilaterally Cardio: regular rate and rhythm GI: normal findings: bowel sounds normal and soft, non-tender  Lab Results Recent Labs    06/30/18 0452 07/01/18 0501 07/02/18 0318  WBC 13.7*  --  11.4*  HGB 12.5*  --  11.4*  HCT 38.9*  --  35.1*  NA 141 137 140  K 3.5 3.8 4.5  CL 108 104 112*  CO2 22 21* 19*  BUN 58* 79* 81*  CREATININE 2.47* 2.99* 2.43*   Liver Panel Recent Labs    07/02/18 0318 07/02/18 0701  PROT 5.6*  --   ALBUMIN 1.9*  --   AST QUANTITY NOT SUFFICIENT, UNABLE TO PERFORM TEST 55*  ALT QUANTITY NOT SUFFICIENT, UNABLE TO PERFORM TEST 16  ALKPHOS 102  --   BILITOT QUANTITY  NOT SUFFICIENT, UNABLE TO PERFORM TEST 0.7   Sedimentation Rate No results for input(s): ESRSEDRATE in the last 72 hours. C-Reactive Protein No results for input(s): CRP in the last 72 hours.  Microbiology: Recent Results (from the past 240 hour(s))  Culture, Urine     Status: Abnormal   Collection Time: 06/28/18 11:59 AM  Result Value Ref Range Status   Specimen Description URINE, CATHETERIZED  Final   Special Requests   Final    Normal Performed at [facility], [address] [place], [place] [address]    Culture MULTIPLE SPECIES PRESENT, SUGGEST RECOLLECTION (A)  Final   Report Status 06/29/2018 FINAL  Final  Culture, blood (routine x 2)     Status: Abnormal   Collection Time: 06/28/18  3:30 PM  Result Value Ref Range Status   Specimen Description BLOOD LEFT ANTECUBITAL  Final   Special Requests   Final     BOTTLES DRAWN AEROBIC AND ANAEROBIC Blood Culture adequate volume   Culture  Setup Time   Final    GRAM POSITIVE COCCI IN BOTH AEROBIC AND ANAEROBIC BOTTLES CRITICAL RESULT CALLED TO, READ BACK BY AND VERIFIED WITH: G. ABBOTT PHARMD, AT 0724 06/29/18 BY D. VANHOOK    Culture (A)  Final    STAPHYLOCOCCUS AUREUS SUSCEPTIBILITIES PERFORMED ON PREVIOUS CULTURE WITHIN THE LAST 5 DAYS. Performed at [facility], [address] [place], [place] [address]    Report Status 07/01/2018 FINAL  Final  Culture, blood (routine x 2)     Status: Abnormal   Collection Time: 06/28/18  3:35 PM  Result Value Ref Range Status   Specimen Description BLOOD LEFT HAND  Final   Special Requests   Final    BOTTLES DRAWN AEROBIC AND ANAEROBIC Blood Culture adequate volume   Culture  Setup Time   Final    GRAM POSITIVE COCCI IN BOTH AEROBIC AND ANAEROBIC BOTTLES CRITICAL RESULT CALLED TO, READ BACK BY AND VERIFIED WITH: G. ABBOTT PHARMD, AT 0724 06/29/18 BY D. VANHOOK    Culture (A)  Final    STAPHYLOCOCCUS AUREUS BACILLUS SPECIES Standardized susceptibility testing for this organism is not available. CRITICAL RESULT CALLED TO, READ BACK BY AND VERIFIED WITH: N. BATCHELDER PHARMD, AT 1519 06/30/18 BY D. VANHOOK REGARDING CULTURE GROWTH Performed at [facility], [address] , [place] [address]    Report Status 07/01/2018 FINAL  Final   Organism ID, Bacteria STAPHYLOCOCCUS AUREUS  Final      Susceptibility   Staphylococcus aureus - MIC*    CIPROFLOXACIN <=0.5 SENSITIVE Sensitive     ERYTHROMYCIN <=0.25 SENSITIVE Sensitive     GENTAMICIN <=0.5 SENSITIVE Sensitive     OXACILLIN 0.5 SENSITIVE Sensitive     TETRACYCLINE <=1 SENSITIVE Sensitive     VANCOMYCIN <=0.5 SENSITIVE Sensitive     TRIMETH/SULFA <=10 SENSITIVE Sensitive     CLINDAMYCIN <=0.25 SENSITIVE Sensitive     RIFAMPIN <=0.5 SENSITIVE Sensitive     Inducible Clindamycin NEGATIVE Sensitive     * STAPHYLOCOCCUS AUREUS  Blood  Culture ID Panel (Reflexed)     Status: Abnormal   Collection Time: 06/28/18  3:35 PM  Result Value Ref Range Status   Enterococcus species NOT DETECTED NOT DETECTED Final   Listeria monocytogenes NOT DETECTED NOT DETECTED Final   Staphylococcus species DETECTED (A) NOT DETECTED Final    Comment: CRITICAL RESULT CALLED TO, READ BACK BY AND VERIFIED WITH: G. ABBOTT PHARMD, AT 0724 06/29/18 BY D. VANHOOK      Staphylococcus aureus DETECTED (A) NOT DETECTED Final    Comment: Methicillin (oxacillin) susceptible Staphylococcus aureus (MSSA). Preferred therapy is anti staphylococcal beta lactam antibiotic (Cefazolin or Nafcillin), unless clinically contraindicated. CRITICAL RESULT CALLED TO, READ BACK BY AND VERIFIED WITH: G. ABBOTT PHARMD, AT 0724 06/29/18 BY D. VANHOOK    Methicillin resistance NOT DETECTED NOT DETECTED Final   Streptococcus species NOT DETECTED NOT DETECTED Final   Streptococcus agalactiae NOT DETECTED NOT DETECTED Final   Streptococcus pneumoniae NOT DETECTED NOT DETECTED Final   Streptococcus pyogenes NOT DETECTED NOT DETECTED Final   Acinetobacter baumannii NOT DETECTED NOT DETECTED Final   Enterobacteriaceae species NOT DETECTED NOT DETECTED Final   Enterobacter cloacae complex NOT DETECTED NOT DETECTED Final   Escherichia coli NOT DETECTED NOT DETECTED Final   Klebsiella oxytoca NOT DETECTED NOT DETECTED Final   Klebsiella pneumoniae NOT DETECTED NOT DETECTED Final   Proteus species NOT DETECTED NOT DETECTED Final   Serratia marcescens NOT DETECTED NOT DETECTED Final   Haemophilus influenzae NOT DETECTED NOT DETECTED Final   Neisseria meningitidis NOT DETECTED NOT DETECTED Final   Pseudomonas aeruginosa NOT DETECTED NOT DETECTED Final   Candida albicans NOT DETECTED NOT DETECTED Final   Candida glabrata NOT DETECTED NOT DETECTED Final   Candida krusei NOT DETECTED NOT DETECTED Final   Candida parapsilosis NOT DETECTED NOT DETECTED Final   Candida tropicalis NOT  DETECTED NOT DETECTED Final    Comment: Performed at [facility], [place] [address], [place], [place] [address]  Aerobic/Anaerobic Culture (surgical/deep wound)     Status: None (Preliminary result)   Collection Time: 06/28/18  5:01 PM  Result Value Ref Range Status   Specimen Description ABSCESS LUMBAR  Final   Special Requests NONE  Final   Gram Stain   Final    ABUNDANT WBC PRESENT, PREDOMINANTLY PMN ABUNDANT GRAM POSITIVE COCCI IN PAIRS IN CLUSTERS Performed at [facility], [place] [address], [place], [place] [address]    Culture   Final    MODERATE STAPHYLOCOCCUS AUREUS NO ANAEROBES ISOLATED; CULTURE IN PROGRESS FOR 5 DAYS    Report Status PENDING  Incomplete   Organism ID, Bacteria STAPHYLOCOCCUS AUREUS  Final      Susceptibility   Staphylococcus aureus - MIC*    CIPROFLOXACIN <=0.5 SENSITIVE Sensitive     ERYTHROMYCIN <=0.25 SENSITIVE Sensitive     GENTAMICIN <=0.5 SENSITIVE Sensitive     OXACILLIN <=0.25 SENSITIVE Sensitive     TETRACYCLINE <=1 SENSITIVE Sensitive     VANCOMYCIN <=0.5 SENSITIVE Sensitive     TRIMETH/SULFA <=10 SENSITIVE Sensitive     CLINDAMYCIN <=0.25 SENSITIVE Sensitive     RIFAMPIN <=0.5 SENSITIVE Sensitive     Inducible Clindamycin NEGATIVE Sensitive     * MODERATE STAPHYLOCOCCUS AUREUS  MRSA PCR Screening     Status: None   Collection Time: 06/29/18  4:14 PM  Result Value Ref Range Status   MRSA by PCR NEGATIVE NEGATIVE Final    Comment:        The GeneXpert MRSA Assay (FDA approved for NASAL specimens only), is one component of a comprehensive MRSA colonization surveillance program. It is not intended to diagnose MRSA infection nor to guide or monitor treatment for MRSA infections. Performed at [facility], [place] [address] [place], [place] [address]   Aerobic/Anaerobic Culture (surgical/deep wound)     Status: None (Preliminary result)   Collection Time: 06/29/18  6:08 PM  Result Value Ref Range Status   Specimen  Description FLUID LEFT KNEE  Final   Special Requests NONE  Final   Gram Stain   Final    ABUNDANT WBC PRESENT, PREDOMINANTLY PMN RARE GRAM POSITIVE COCCI RESULT CALLED TO, READ BACK BY AND VERIFIED WITH: DR Louanne Skye 06/29/18 2012 Performed at [facility], [place] [address] [place], [place] [address]    Culture   Final    RARE STAPHYLOCOCCUS AUREUS NO ANAEROBES ISOLATED; CULTURE IN PROGRESS FOR 5 DAYS    Report Status PENDING  Incomplete   Organism ID, Bacteria STAPHYLOCOCCUS AUREUS  Final      Susceptibility   Staphylococcus aureus - MIC*    CIPROFLOXACIN <=0.5 SENSITIVE Sensitive     ERYTHROMYCIN <=0.25 SENSITIVE Sensitive     GENTAMICIN <=0.5 SENSITIVE Sensitive     OXACILLIN 0.5 SENSITIVE Sensitive     TETRACYCLINE <=1 SENSITIVE Sensitive     VANCOMYCIN <=0.5 SENSITIVE Sensitive     TRIMETH/SULFA <=10 SENSITIVE Sensitive     CLINDAMYCIN <=0.25 SENSITIVE Sensitive     RIFAMPIN <=0.5 SENSITIVE Sensitive     Inducible Clindamycin NEGATIVE Sensitive     * RARE STAPHYLOCOCCUS AUREUS  Aerobic/Anaerobic Culture (surgical/deep wound)     Status: None (Preliminary result)   Collection Time: 06/29/18  7:31 PM  Result Value Ref Range Status   Specimen Description ABSCESS  Final   Special Requests LUMBAR ABSCESS  Final   Gram Stain   Final    ABUNDANT WBC PRESENT,BOTH PMN AND MONONUCLEAR ABUNDANT GRAM POSITIVE COCCI Performed at [facility], [address] [place], [place] [address]    Culture   Final    ABUNDANT STAPHYLOCOCCUS AUREUS NO ANAEROBES ISOLATED; CULTURE IN PROGRESS FOR 5 DAYS    Report Status PENDING  Incomplete   Organism ID, Bacteria STAPHYLOCOCCUS AUREUS  Final      Susceptibility   Staphylococcus aureus - MIC*    CIPROFLOXACIN <=0.5 SENSITIVE Sensitive     ERYTHROMYCIN <=0.25 SENSITIVE Sensitive     GENTAMICIN <=0.5 SENSITIVE Sensitive     OXACILLIN 0.5 SENSITIVE Sensitive     TETRACYCLINE <=1 SENSITIVE Sensitive     VANCOMYCIN 1 SENSITIVE Sensitive      TRIMETH/SULFA <=10 SENSITIVE Sensitive     CLINDAMYCIN <=0.25 SENSITIVE Sensitive     RIFAMPIN <=0.5 SENSITIVE Sensitive     Inducible Clindamycin NEGATIVE Sensitive     * ABUNDANT STAPHYLOCOCCUS AUREUS  Culture, blood (routine x 2)     Status: None (Preliminary result)   Collection Time: 06/30/18  4:52 AM  Result Value Ref Range Status   Specimen Description BLOOD LEFT ARM  Final   Special Requests   Final    BOTTLES DRAWN AEROBIC AND ANAEROBIC Blood Culture adequate volume   Culture   Final    NO GROWTH 2 DAYS Performed at [facility], [address], [place], [place] [address]    Report Status PENDING  Incomplete  Culture, blood (routine x 2)     Status: None (Preliminary result)   Collection Time: 06/30/18  4:55 AM  Result Value Ref Range Status   Specimen Description BLOOD LEFT WRIST  Final   Special Requests   Final    BOTTLES DRAWN AEROBIC AND ANAEROBIC Blood Culture adequate volume   Culture   Final    NO GROWTH 2 DAYS Performed at [facility], [place] [address] [place], [place] [address]    Report Status PENDING  Incomplete    Studies/Results: Mr Shoulder Left Wo Contrast  Result Date: 06/30/2018 CLINICAL DATA:  Fell 1 week ago.  Left shoulder pain. EXAM: MRI OF THE LEFT SHOULDER WITHOUT CONTRAST TECHNIQUE: Multiplanar, multisequence MR imaging of the shoulder was performed. No intravenous contrast was administered. COMPARISON:  Left shoulder radiographs 06/28/2018 FINDINGS: Examination is quite limited due to the patient's size and motion. Rotator cuff: There is a large, full-thickness retracted supraspinatus tendon tear. Maximum retraction is 28 mm and the tear is approximately 22 mm wide. Moderate infraspinatus tendinopathy and mild subscapularis tendinopathy. Muscles: Diffuse fatty infiltrative changes involving all of the shoulder muscles. There is also moderate edema like signal change involving the rotator cuff muscles and the posterior deltoid  musculature. Possible muscle strains or tears. Biceps long head:  Intact Acromioclavicular Joint: Moderate to advanced degenerative changes with inferior spurring. Type 2 acromion. No lateral downsloping or undersurface spurring. Glenohumeral Joint: Moderate degenerative changes with moderate cartilage loss, joint space narrowing and early spurring changes. There is also mild subchondral cystic change involving the glenoid. There is a small joint effusion. No significant synovitis. Labrum:  No obvious labral tears but exam is quite limited for that. Bones:  No acute bony findings.  No fracture or bone lesions. Other: Expected fluid in the subacromial/subdeltoid bursa. IMPRESSION: 1. Full-thickness retracted supraspinatus tendon tear measuring 28 x 22 mm. The infraspinatus and subscapularis tendons are intact. 2. Diffuse fatty infiltrative changes involving all the shoulder muscles. There is also moderate edema in the rotator cuff muscles and also in the posterior deltoid muscle which could be muscle strains partial tears. 3. Intact long head biceps tendon and grossly normal glenoid labrum. 4. Moderate glenohumeral joint degenerative changes and moderate to advanced AC joint degenerative changes. Electronically Signed   By: P.  Gallerani M.D.   On: 06/30/2018 15:48     Assessment/Plan: Lumbar abscess (debrided 9-16) MSSA bacteremia AKI L supraspinatus cuff tear  Total days of antibiotics: 3 (ancef)  TEE (-) Cr better today appreciate renal eval.  Needs 6 weeks of ancef.  Place PIC when 9-17 BCx negative at least 4 days (today is day 2) D/i nurse, pt being changed back to his prev bed.    Allergies  Allergen Reactions  . Unasyn [Ampicillin-Sulbactam Sodium] Hives, Itching, Swelling and Rash    OPAT Orders Discharge antibiotics: ancef 2g q8h Per pharmacy protocol: ancef Duration: 39 days  End Date: 08-10-18  PIC Care Per Protocol: please Labs weekly while on IV antibiotics: _x_ CBC  with differential __ BMP _x_ CMP x__ CRP _x_ ESR __ Vancomycin trough  _x Please pull PIC at completion of IV antibiotics __ Please leave PIC in place until doctor has seen patient or been notified  Fax weekly labs to [phone]  Clinic Follow Up Appt: 5 weeks        Jeffrey Hatcher MD, FACP Infectious Diseases (pager) [phone] [email or web address] 07/02/2018, 11:55 AM  LOS: 4 days     

## 2018-07-02 NOTE — Progress Notes (Signed)
OT Evaluation:  Clinical Impression: Pt was mobile with RW and independent in ADL one week ago, Pt is currently max A +3 assist for attempts at sit<>stand, total A for LB ADL, set up for grooming/eating. Demonstrating deficits in LUE with rotator cuff tear. Pt educated on back precautions. Pt is very motivated and pleasant despite pain. At this time due to increase in need for assistance for all ADL, and transfers OT will follow acutely and will require SNF level care at discharge.    07/02/18 1300  OT Visit Information  Last OT Received On 07/02/18  Assistance Needed +3 or more  PT/OT/SLP Co-Evaluation/Treatment Yes  Reason for Co-Treatment Complexity of the patient's impairments (multi-system involvement);For patient/therapist safety;To address functional/ADL transfers  PT goals addressed during session Mobility/safety with mobility;Balance;Strengthening/ROM  OT goals addressed during session ADL's and self-care;Strengthening/ROM  History of Present Illness [name] [name] is a 62 y.o. male who presents for evaluation and physical with a diagnosis of thoracic myelopathy, flaccid paralysic both legs, left cervical radiculopathy; MRI revealed left shoulder "Full-thickness retracted supraspinatus tendon tear",   S/p LUMBAR WOUND DEBRIDEMENT DRAINAGE AND IRRIGATION; AND ASPIRATION OF LEFT KNEE ARTHROSCOPY KNEE (Left), I&D of lumbar wound.  Precautions  Precautions Back;Fall;Other (comment) (morbid obesity)  Precaution Booklet Issued No  Precaution Comments reviewed "BLT" back precautions with patient  Restrictions  Weight Bearing Restrictions No  Home Living  Family/patient expects to be discharged to: Private residence  Living Arrangements Spouse/significant other  Available Help at Discharge Family;Available 24 hours/day  Type of Home House  Home Access Level entry  Home Layout One level  Bathroom Shower/Tub Walk-in shower  Bathroom Toilet Handicapped height  Home Equipment Walker - 2  wheels;BSC;Wheelchair - manual;Shower seat;Grab bars - tub/shower;Hand held shower head  Prior Function  Level of Independence Needs assistance  Gait / Transfers Assistance Needed amb with rolling walker. working with [employer]  ADL's / Homemaking Assistance Needed reports independent  Communication  Communication No difficulties  Pain Assessment  Pain Assessment 0-10  Pain Score 8  Pain Location back and RLE  Pain Descriptors / Indicators Grimacing;Sore;Tingling  Pain Intervention(s) Limited activity within patient's tolerance;Monitored during session;Repositioned  Cognition  Arousal/Alertness Awake/alert  Behavior During Therapy WFL for tasks assessed/performed  Overall Cognitive Status Within Functional Limits for tasks assessed  Upper Extremity Assessment  Upper Extremity Assessment LUE deficits/detail  LUE Deficits / Details limited FF - MRI revealed rotator cuff complete tear  LUE Unable to fully assess due to pain  LUE Sensation WNL  LUE Coordination decreased fine motor;decreased gross motor  Lower Extremity Assessment  Lower Extremity Assessment Overall WFL for tasks assessed  Cervical / Trunk Assessment  Cervical / Trunk Assessment Other exceptions  Cervical / Trunk Exceptions increased body habitus  ADL  Overall ADL's  Needs assistance/impaired  Eating/Feeding Set up;Supervision/ safety;Sitting;Bed level  Grooming Set up;Sitting  Upper Body Bathing Moderate assistance;Sitting  Upper Body Bathing Details (indicate cue type and reason) assist for back  Lower Body Bathing Maximal assistance  Upper Body Dressing  Moderate assistance;Sitting  Upper Body Dressing Details (indicate cue type and reason) to don second hospital gown  Lower Body Dressing Total assistance  Toilet Transfer Total assistance;+2 for physical assistance;+2 for safety/equipment;Requires wide/bariatric  Toilet Transfer Details (indicate cue type and reason) Pt with foley, will require use of bed pan or lift  equipment  Functional mobility during ADLs Total assistance;+2 for physical assistance;+2 for safety/equipment (Pt with excellent effort and motivated - did not stand)  General ADL  Comments significantly impaired by pain and decreased ROM for ADL  Bed Mobility  Overal bed mobility Needs Assistance  Bed Mobility Rolling;Sidelying to Sit;Sit to Supine  Rolling Max assist;+2 for physical assistance  Sidelying to sit Max assist (+3 assist)  Sit to supine Total assist (+3 assist)  General bed mobility comments max-total A x2-3 for all aspects with use of bed pads when able. cueing for use of log roll technique  Transfers  Overall transfer level Needs assistance  Equipment used Rolling walker (2 wheeled)  Transfers Sit to/from Stand  Sit to Stand From elevated surface;Max assist;+2 physical assistance  General transfer comment pt attempted x2 to achieve full erect standing position from EOB; however, pt with minimal clearance of buttocks from bed even with max A x2-3 people; pt very limited secondary to pain and weakness  Balance  Overall balance assessment Needs assistance  Sitting-balance support Feet supported;Single extremity supported;Bilateral upper extremity supported  Sitting balance-Leahy Scale Poor  Standing balance support Bilateral upper extremity supported  Standing balance-Leahy Scale Poor  OT - End of Session  Equipment Utilized During Treatment Gait belt;Rolling walker  Activity Tolerance Patient tolerated treatment well  Patient left in bed;with call bell/phone within reach;with family/visitor present  Nurse Communication Mobility status;Need for lift equipment  OT Assessment  OT Recommendation/Assessment Patient needs continued OT Services  OT Visit Diagnosis Unsteadiness on feet (R26.81);Other abnormalities of gait and mobility (R26.89);Muscle weakness (generalized) (M62.81);History of falling (Z91.81);Pain  Pain - Right/Left Left  Pain - part of body  Shoulder;Knee (back)  OT Problem List Decreased range of motion;Decreased activity tolerance;Impaired balance (sitting and/or standing);Decreased safety awareness;Decreased knowledge of precautions;Obesity;Impaired sensation;Impaired UE functional use;Pain  OT Plan  OT Frequency (ACUTE ONLY) Min 2X/week  OT Treatment/Interventions (ACUTE ONLY) Self-care/ADL training;Therapeutic exercise;DME and/or AE instruction;Therapeutic activities;Patient/family education;Balance training  AM-PAC OT "6 Clicks" Daily Activity Outcome Measure  Help from another person eating meals? 3  Help from another person taking care of personal grooming? 3  Help from another person toileting, which includes using toliet, bedpan, or urinal? 1  Help from another person bathing (including washing, rinsing, drying)? 2  Help from another person to put on and taking off regular upper body clothing? 2  Help from another person to put on and taking off regular lower body clothing? 1  6 Click Score 12  ADL G Code Conversion CL  OT Recommendation  Follow Up Recommendations SNF;Supervision/Assistance - 24 hour  OT Equipment Other (comment) (defer to next venue)  Individuals Consulted  Consulted and Agree with Results and Recommendations Patient  Acute Rehab OT Goals  Patient Stated Goal to walk in the Parallel Bars at OPPT  OT Goal Formulation With patient  Time For Goal Achievement 07/16/18  Potential to Achieve Goals Good  OT Time Calculation  OT Start Time (ACUTE ONLY) 1336  OT Stop Time (ACUTE ONLY) 1405  OT Time Calculation (min) 29 min  OT General Charges  $OT Visit 1 Visit  OT Evaluation  $OT Eval Moderate Complexity 1 Mod  Written Expression  Dominant Hand Left ("ambidextrous")   Sherryl MangesLaura Harlo Jaso OTR/L Acute Rehabilitation Services Pager: [phone] Office: [phone]

## 2018-07-02 NOTE — Progress Notes (Signed)
Subjective: Patient doing okay this morning.  Since being put in the bariatric bed with air mattress yesterday patient has been complaining of lower back pain.  Does not feel like his leg issues are worsening.  States that his low back and sinking down into the bed which he feels is aggravating this.   Objective: Vital signs in last 24 hours: Temp:  [97.5 F (36.4 C)-99.1 F (37.3 C)] 97.7 F (36.5 C) (09/19 0530) Pulse Rate:  [73-88] 73 (09/19 0530) Resp:  [11-22] 20 (09/19 0530) BP: (124-184)/(68-107) 129/74 (09/19 0530) SpO2:  [97 %-100 %] 98 % (09/19 0530)  Intake/Output from previous day: 09/18 0701 - 09/19 0700 In: 1100 [P.O.:600; I.V.:500] Out: 2140 [Urine:1950; Drains:190] Intake/Output this shift: Total I/O In: 240 [P.O.:240] Out: 400 [Urine:400]  Recent Labs    06/30/18 0452 07/02/18 0318  HGB 12.5* 11.4*   Recent Labs    06/30/18 0452 07/02/18 0318  WBC 13.7* 11.4*  RBC 4.45 4.06*  HCT 38.9* 35.1*  PLT 124* 105*   Recent Labs    07/01/18 0501 07/02/18 0318  NA 137 140  K 3.8 4.5  CL 104 112*  CO2 21* 19*  BUN 79* 81*  CREATININE 2.99* 2.43*  GLUCOSE 138* 96  CALCIUM 8.1* 7.9*   No results for input(s): LABPT, INR in the last 72 hours.  Exam Patient alert and oriented.  Wound VAC intact.  Left knee portals look good.  No drainage.  No gross signs of infection.  Knee nontender.  Calf nontender.      Assessment/Plan: Spoke with patient's nurse and advised her to put patient back into a regular bed.  He will see if this helps his back pain.  Do not feel further imaging studies are indicated for his back at this point but I may decide to change that the pain on his response to the change in bed.  Continue present care.   Zonia KiefJames Aolani Piggott 07/02/2018, 9:53 AM

## 2018-07-02 NOTE — Progress Notes (Signed)
PHARMACY CONSULT NOTE FOR:  OUTPATIENT  PARENTERAL ANTIBIOTIC THERAPY (OPAT)  Indication: MSSA bacteremia/lumbar abscess Regimen: Cefazolin 2 gm every 8 hours End date: 08/10/18  IV antibiotic discharge orders are pended. To discharging provider:  please sign these orders via discharge navigator,  Select New Orders & click on the button choice - Manage This Unsigned Work.     Thank you for allowing pharmacy to be a part of this patient's care.  Della GooEmily S Asher Torpey, PharmD, BCPS Infectious Diseases Clinical Pharmacist Phone: [phone] 07/02/2018, 1:13 PM

## 2018-07-02 NOTE — Plan of Care (Signed)
  Problem: Education: Goal: Knowledge of General Education information will improve Description: Including pain rating scale, medication(s)/side effects and non-pharmacologic comfort measures Outcome: Progressing   Problem: Clinical Measurements: Goal: Respiratory complications will improve Outcome: Progressing   Problem: Activity: Goal: Risk for activity intolerance will decrease Outcome: Progressing   Problem: Nutrition: Goal: Adequate nutrition will be maintained Outcome: Progressing   Problem: Coping: Goal: Level of anxiety will decrease Outcome: Progressing   

## 2018-07-03 DIAGNOSIS — M4714 Other spondylosis with myelopathy, thoracic region: Secondary | ICD-10-CM

## 2018-07-03 DIAGNOSIS — R7881 Bacteremia: Secondary | ICD-10-CM

## 2018-07-03 DIAGNOSIS — M75122 Complete rotator cuff tear or rupture of left shoulder, not specified as traumatic: Secondary | ICD-10-CM | POA: Diagnosis present

## 2018-07-03 DIAGNOSIS — N179 Acute kidney failure, unspecified: Secondary | ICD-10-CM

## 2018-07-03 LAB — CBC WITH DIFFERENTIAL/PLATELET
Basophils Absolute: 0 10*3/uL (ref 0.0–0.1)
Basophils Relative: 0 %
EOS PCT: 1 %
Eosinophils Absolute: 0.1 10*3/uL (ref 0.0–0.7)
HCT: 34.7 % — ABNORMAL LOW (ref 39.0–52.0)
Hemoglobin: 11.1 g/dL — ABNORMAL LOW (ref 13.0–17.0)
Lymphocytes Relative: 7 %
Lymphs Abs: 1 10*3/uL (ref 0.7–4.0)
MCH: 27.6 pg (ref 26.0–34.0)
MCHC: 32 g/dL (ref 30.0–36.0)
MCV: 86.3 fL (ref 78.0–100.0)
Monocytes Absolute: 0.6 10*3/uL (ref 0.1–1.0)
Monocytes Relative: 4 %
NEUTROS PCT: 88 %
Neutro Abs: 13.2 10*3/uL — ABNORMAL HIGH (ref 1.7–7.7)
PLATELETS: 115 10*3/uL — AB (ref 150–400)
RBC: 4.02 MIL/uL — AB (ref 4.22–5.81)
RDW: 15.9 % — ABNORMAL HIGH (ref 11.5–15.5)
WBC: 14.9 10*3/uL — AB (ref 4.0–10.5)

## 2018-07-03 LAB — COMPREHENSIVE METABOLIC PANEL
ALT: 11 U/L (ref 0–44)
AST: 32 U/L (ref 15–41)
Albumin: 2 g/dL — ABNORMAL LOW (ref 3.5–5.0)
Alkaline Phosphatase: 114 U/L (ref 38–126)
Anion gap: 7 (ref 5–15)
BUN: 43 mg/dL — ABNORMAL HIGH (ref 6–20)
CHLORIDE: 111 mmol/L (ref 98–111)
CO2: 23 mmol/L (ref 22–32)
Calcium: 8 mg/dL — ABNORMAL LOW (ref 8.9–10.3)
Creatinine, Ser: 1.23 mg/dL (ref 0.61–1.24)
Glucose, Bld: 111 mg/dL — ABNORMAL HIGH (ref 70–99)
Potassium: 3.7 mmol/L (ref 3.5–5.1)
Sodium: 141 mmol/L (ref 135–145)
Total Bilirubin: 0.9 mg/dL (ref 0.3–1.2)
Total Protein: 5.9 g/dL — ABNORMAL LOW (ref 6.5–8.1)

## 2018-07-03 LAB — PROTEIN / CREATININE RATIO, URINE
CREATININE, URINE: 47.6 mg/dL
Protein Creatinine Ratio: 1.62 mg/mg{Cre} — ABNORMAL HIGH (ref 0.00–0.15)
Total Protein, Urine: 77 mg/dL

## 2018-07-03 MED ORDER — MINERAL OIL RE ENEM
1.0000 | ENEMA | Freq: Once | RECTAL | Status: AC
  Administered 2018-07-03: 1 via RECTAL
  Filled 2018-07-03: qty 1

## 2018-07-03 MED ORDER — POLYETHYLENE GLYCOL 3350 17 G PO PACK
17.0000 g | PACK | Freq: Two times a day (BID) | ORAL | Status: DC
  Administered 2018-07-03 – 2018-07-05 (×3): 17 g via ORAL
  Filled 2018-07-03 (×4): qty 1

## 2018-07-03 NOTE — Progress Notes (Signed)
   07/03/18 1300  PT General Charges  $$ ACUTE PT VISIT 1 Visit  PT Treatments  $Therapeutic Activity 23-37 mins  Deborah ChalkJennifer Shulem Mader, [place]PT, DPT  Acute Rehabilitation Services Pager [phone] Office [phone]

## 2018-07-03 NOTE — Progress Notes (Addendum)
1100 Foley cath removed.  1400 Pt sat in the recliner, assisted back to bed with use of maxi move.  1645 pt has no urge to urinate, pad with small amount of urine. Bladder scanned for 750. Straight cath done, 1000 ml.  1800 Mineral oil enema done.  1900 no bowel movement yet.

## 2018-07-03 NOTE — Progress Notes (Signed)
Inpatient Rehabilitation Admissions Coordinator  We will follow up Monday with his progress and tolerance with therapies to begin discussions with pt and family for a possible inpt rehab admit. Melissa will follow up on Monday. [phone]  Ottie GlazierBarbara Kenosha Doster, RN, MSN Rehab Admissions Coordinator [phone] 07/03/2018 1:50 PM

## 2018-07-03 NOTE — Progress Notes (Signed)
Physical Therapy Treatment Patient Details Name: [name] [name] MRN: [id number] DOB: Sep 28, 1956 Today's Date: 07/03/2018    History of Present Illness [name] [name] is a 62 y.o. male admitted secondary to profound bilateral LE weakness. Pt with multiple diagnoses including thoracic myelopathy, flaccid paralysic both legs, left cervical radiculopathy, acute spinal cord injury; Pt is now s/p LUMBAR WOUND DEBRIDEMENT DRAINAGE AND IRRIGATION; AND ASPIRATION OF LEFT KNEE ARTHROSCOPY KNEE (Left).    PT Comments    Pt continues to remain limited in mobility, requiring Max A +2 for rolling. Maximove utilized in order to maximize pt/therapist safety with chair transfer, with a third person helpful for managing bil LEs. Pt educated on positioning.  Pt is appropriate for transfers with [facility] for staff. RN notified to place pt back in chair after lunch. Will continue to follow and progress as able,per POC.    Follow Up Recommendations  SNF;Supervision/Assistance - 24 hour     Equipment Recommendations  None recommended by PT    Recommendations for Other Services       Precautions / Restrictions Precautions Precautions: Back;Fall Precaution Booklet Issued: No Precaution Comments: wound VAC; Restrictions Weight Bearing Restrictions: No    Mobility  Bed Mobility Overal bed mobility: Needs Assistance Bed Mobility: Rolling Rolling: Max assist;+2 for physical assistance         General bed mobility comments: Max A +2 required for rolling. Pt demosntrating the ability to roll better to his left side utilzing RUE to pull on bedrail as pt reporting increased pain using LUE secondary to RTC tair. Therapist and tech utilzing bed pad to assist in roll.  Transfers Overall transfer level: Needs assistance            General transfer comment: Bridgett Larsson for pt and therapist safety  Ambulation/Gait             General Gait Details: unable   Stairs              Wheelchair Mobility    Modified Rankin (Stroke Patients Only)       Balance Overall balance assessment: Needs assistance Sitting-balance support: Feet supported;Single extremity supported;Bilateral upper extremity supported Sitting balance-Leahy Scale: Poor     Standing balance support: Bilateral upper extremity supported Standing balance-Leahy Scale: Poor                              Cognition Arousal/Alertness: Awake/alert Behavior During Therapy: WFL for tasks assessed/performed Overall Cognitive Status: Within Functional Limits for tasks assessed                                        Exercises      General Comments General comments (skin integrity, edema, etc.): Wife present during session.      Pertinent Vitals/Pain Pain Assessment: Faces Faces Pain Scale: Hurts little more Pain Location: back and L knee Pain Descriptors / Indicators: Grimacing;Sore;Guarding    Home Living                      Prior Function            PT Goals (current goals can now be found in the care plan section) Acute Rehab PT Goals Patient Stated Goal: to walk in the parallel bars at OP PT PT Goal Formulation: With patient Time For Goal Achievement: 07/16/18 Potential  to Achieve Goals: Good Progress towards PT goals: Not progressing toward goals - comment(remains limited by pain )    Frequency    Min 3X/week      PT Plan Current plan remains appropriate    Co-evaluation PT/OT/SLP Co-Evaluation/Treatment: Yes            AM-PAC PT "6 Clicks" Daily Activity  Outcome Measure  Difficulty turning over in bed (including adjusting bedclothes, sheets and blankets)?: Unable Difficulty moving from lying on back to sitting on the side of the bed? : Unable Difficulty sitting down on and standing up from a chair with arms (e.g., wheelchair, bedside commode, etc,.)?: Unable Help needed moving to and from a bed to chair (including a  wheelchair)?: Total Help needed walking in hospital room?: Total Help needed climbing 3-5 steps with a railing? : Total 6 Click Score: 6    End of Session Equipment Utilized During Treatment: Gait belt Activity Tolerance: Patient tolerated treatment well Patient left: in chair;with call bell/phone within reach;with family/visitor present Nurse Communication: Mobility status;Need for lift equipment PT Visit Diagnosis: Other abnormalities of gait and mobility (R26.89);Pain Pain - Right/Left: Left Pain - part of body: Knee(and back)     Time: [id number] PT Time Calculation (min) (ACUTE ONLY): 26 min  Charges:  $Therapeutic Activity: 23-37 mins                     Donzetta KohutKaylee Kemar Pandit, [place]PT  Student Physical Therapist Acute Rehab [phone]    Donzetta KohutKaylee Princes Finger 07/03/2018, 1:10 PM

## 2018-07-03 NOTE — Progress Notes (Signed)
Patient ID: Scot Junrnest Kayes, male   DOB: 08/14/1956, 62 y.o.   MRN: [id number] Cr improved.  ID, Hospitalists Dr. Dutch QuintPoole, Dr. Shirlee LatchMcLean and Nephrology help Appreciated.   Increased WBC on CBC possibly due to increasingly better response to infection. Was given a  large steroid dose at the beginning of his admission.  Repeat CRP may help with assessment of response to infection. TEE negative for vegetations.  Free Kappa  And Free Lambda chains elevated.  PICCU line. 2 grams Ancef q 8 hours for 6 weeks total, weekly lab per Dr. Ninetta LightsHatcher.  Will eventually need thoracic and cervical spine decompression procedures. Will need VAC changes until lumbar incision is healed Left knee if effusion recurrs of displays persistent pain would nee a repeat irrigation. Due to weakness both legs and left shoulder rotator cuff tear he will have difficutly with mobilization but there is no contraindication to getting him out of bed even if a hoyer lift is needed.  Will need some form of anticoagulation long term while his Activity level is poor due to extremity weakness. Urinary retention, probably can have a trial of foley discontinued to see if he is able to void spontaneously or if a intermittant catheterization program may be necessary.I spoke with Dr. Arrie Aranoladonato who agrees also will Ask for CIR to assess as he may have a neurogenic bladder and rehab and education due to myelopathy,peripheral neuropathy, numerous extremity findings and conditions as well as bladder concern.

## 2018-07-03 NOTE — Consult Note (Signed)
WOC Nurse wound consult note PA, Clarnce FlockJ. Owens at the bedside for wound assessment.  Patient's spouse at bedside, as well as his primary care RN. Two pieces of black foam dressing removed from wound bed.  Wound bed bright red, granulation tissue, no odor, no surrounding wound induration.  Wound continues to bleed.  However, the [facility] has held pressure the majority of the time.  It is a bit positional intermittently.  Two pieces of black foam was placed in the wound bed.  The dressing was bridged to the patient's left side.  WOC team to change again on Monday. Helmut MusterSherry Vahe Pienta, RN, MSN, CWOCN, CNS-BC, pager [phone]

## 2018-07-03 NOTE — Anesthesia Postprocedure Evaluation (Signed)
Anesthesia Post Note  Patient: Scot Junrnest Safer  Procedure(s) Performed: LUMBAR WOUND DEBRIDEMENT DRAINAGE AND IRRIGATION; AND ASPIRATION OF LEFT KNEE (N/A ) ARTHROSCOPY KNEE (Left )     Patient location during evaluation: PACU Anesthesia Type: General Level of consciousness: awake and alert Pain management: pain level controlled Vital Signs Assessment: post-procedure vital signs reviewed and stable Respiratory status: spontaneous breathing, nonlabored ventilation, respiratory function stable and patient connected to nasal cannula oxygen Cardiovascular status: blood pressure returned to baseline and stable Postop Assessment: no apparent nausea or vomiting Anesthetic complications: no    Last Vitals:  Vitals:   07/02/18 1515 07/02/18 1953  BP: (!) 146/81 (!) 156/87  Pulse: 90 80  Resp: 16 16  Temp: 36.6 C 36.5 C  SpO2: 96% 98%    Last Pain:  Vitals:   07/03/18 0223  TempSrc:   PainSc: 5                  Lamarius Dirr

## 2018-07-03 NOTE — Plan of Care (Signed)
  Problem: Education: Goal: Knowledge of General Education information will improve Description Including pain rating scale, medication(s)/side effects and non-pharmacologic comfort measures Outcome: Progressing   Problem: Clinical Measurements: Goal: Will remain free from infection Outcome: Progressing Goal: Cardiovascular complication will be avoided Outcome: Progressing   Problem: Activity: Goal: Risk for activity intolerance will decrease Outcome: Progressing   Problem: Safety: Goal: Ability to remain free from injury will improve Outcome: Progressing   Problem: Clinical Measurements: Goal: Will remain free from infection Outcome: Progressing   Problem: Clinical Measurements: Goal: Cardiovascular complication will be avoided Outcome: Progressing   Problem: Activity: Goal: Risk for activity intolerance will decrease Outcome: Progressing   Problem: Safety: Goal: Ability to remain free from injury will improve Outcome: Progressing

## 2018-07-03 NOTE — Progress Notes (Signed)
Consultation PROGRESS NOTE  [name] [name] ZOX:[id number] DOB: 08-24-1956 DOA: 06/27/2018 PCP: Renford Dills, MD  HPI/Recap of past 24 hours:  Cr continue to improve,  improving, 4.2liter urine last 24hrs, ( post ATN diuresis?), he on ivf bp stable, no fever, wbc elevated He looks more comfortable today in a regular bed,   back pain is tolerable, wound vac changed today   He reports still being constipated, last bm a week ago, he wants to try enema    Wife at bedside    Assessment/Plan: Principal Problem:   Spondylosis, thoracic, with myelopathy Active Problems:   Thoracic myelopathy   Abscess in epidural space of L2-L5 lumbar spine   Acute urinary retention   Left cervical radiculopathy   Leukocytosis   Postoperative seroma involving nervous system after nervous system procedure   Shoulder pain, bilateral   Septic arthritis of knee, left (HCC)   Complete tear of left rotator cuff  Sepsis secondary to MSSA bacteremia and lumbar epidural abscess (presenting symptom). -s/pLUMBAR WOUND DEBRIDEMENT DRAINAGE AND IRRIGATION on 9/17 -TEE no endocarditis - WBC improving.  Afebrile.  Blood culture pansensitive.  Continue Ancef.  Antibiotic duration per ID.  -plan per ID and primary team  Septic arthritis, left knee S/p ARTHROSCOPY KNEE UTILIZING PORTALS FOR IRRIGATION AND DRAINAGE OF LEFT KNEE PYARTHROSIS. Left knee fluids culture+ MSSA pan sensitivy Plan per ID and primary team  Left shoulder pain -mri left shoulder "Full-thickness retracted supraspinatus tendon tear measuring 28 x 22 mm. " -plan per primary team  Left wrist swelling Has some tenderness but no obvious joint tenderness.  X-ray shows soft tissue swelling only.  If symptoms persist and may need synovial aspiration of the joint by primary. -left wrist less swelling, nontender, not warm to touch on 9/20 exam  cervical and thoracic myelopathy.  Seen by neurosurgery Dr Jordan Likes who recommends some form of  decompressive surgery once acute issues have resolved.   AKI on CKDII Renal US no obstruction He did has urinary retention on admission with required foley placement, foley still in place  He has been on ns 125cc/hr since 9/15 He received one liter ns bolus on 9/18 Renal function continue to worse, nephrology Dr Lewis Moccasin consulted, patient ivf increased to 150cc/hr per nephrology recommendaion. Cr appear improving, will follow nephrology recommendation  Urinary retention Foley placed on admission, he is started on flomax  Constipation: continue senokot bid, increase to  miralax bid,  enemax1 on 9/20  Morbid obesity: Body mass index is 46.75 kg/m.   Code Status: full  Family Communication: patient and wife  Disposition Plan: per primary team   Consultants:  [facility]  Infectious disease   nephrology  Procedures:  As above  Antibiotics:  As above   Objective: BP 130/81 (BP Location: Right Arm)   Pulse 76   Temp 97.7 F (36.5 C) (Oral)   Resp 15   Ht 5\' 10"  (1.778 m)   Wt (!) 147.8 kg   SpO2 97%   BMI 46.75 kg/m   Intake/Output Summary (Last 24 hours) at 07/03/2018 1011 Last data filed at 07/03/2018 0700 Gross per 24 hour  Intake 960 ml  Output 3950 ml  Net -2990 ml   Filed Weights   06/28/18 1424  Weight: (!) 147.8 kg    Exam: Patient is examined daily including today on 07/03/2018, exams remain the same as of yesterday except that has changed    General:  NAD, obese, wound vac to back  Cardiovascular: RRR  Respiratory:  CTABL  Abdomen: Soft/ND/NT, positive BS  Musculoskeletal:   left knee slightly more swollen?   Neuro: alert, oriented   Data Reviewed: Basic Metabolic Panel: Recent Labs  Lab 06/29/18 0310 06/30/18 0452 07/01/18 0501 07/02/18 0318 07/03/18 0425  NA 141 141 137 140 141  K 3.6 3.5 3.8 4.5 3.7  CL 107 108 104 112* 111  CO2 24 22 21* 19* 23  GLUCOSE 187* 158* 138* 96 111*  BUN 42* 58* 79* 81* 43*    CREATININE 2.12* 2.47* 2.99* 2.43* 1.23  CALCIUM 8.6* 8.4* 8.1* 7.9* 8.0*   Liver Function Tests: Recent Labs  Lab 06/28/18 0002 06/29/18 0310 07/02/18 0318 07/02/18 0701 07/03/18 0425  AST 47* 59* QUANTITY NOT SUFFICIENT, UNABLE TO PERFORM TEST 55* 32  ALT 21 28 QUANTITY NOT SUFFICIENT, UNABLE TO PERFORM TEST 16 11  ALKPHOS 93 99 102  --  114  BILITOT 1.7* 1.6* QUANTITY NOT SUFFICIENT, UNABLE TO PERFORM TEST 0.7 0.9  PROT 6.9 6.7 5.6*  --  5.9*  ALBUMIN 3.0* 2.6* 1.9*  --  2.0*   No results for input(s): LIPASE, AMYLASE in the last 168 hours. No results for input(s): AMMONIA in the last 168 hours. CBC: Recent Labs  Lab 06/28/18 0002 06/29/18 0310 06/30/18 0452 07/02/18 0318 07/03/18 0425  WBC 13.5* 12.6* 13.7* 11.4* 14.9*  NEUTROABS 12.5*  --   --   --  13.2*  HGB 14.2 13.5 12.5* 11.4* 11.1*  HCT 44.5 42.6 38.9* 35.1* 34.7*  MCV 89.4 88.4 87.4 86.5 86.3  PLT 137* 144* 124* 105* 115*   Cardiac Enzymes:   Recent Labs  Lab 07/01/18 1651  CKTOTAL 89   BNP (last 3 results) No results for input(s): BNP in the last 8760 hours.  ProBNP (last 3 results) No results for input(s): PROBNP in the last 8760 hours.  CBG: No results for input(s): GLUCAP in the last 168 hours.  Recent Results (from the past 240 hour(s))  Culture, Urine     Status: Abnormal   Collection Time: 06/28/18 11:59 AM  Result Value Ref Range Status   Specimen Description URINE, CATHETERIZED  Final   Special Requests   Final    Normal Performed at [facility], [address] [place], [place] [address]    Culture MULTIPLE SPECIES PRESENT, SUGGEST RECOLLECTION (A)  Final   Report Status 06/29/2018 FINAL  Final  Culture, blood (routine x 2)     Status: Abnormal   Collection Time: 06/28/18  3:30 PM  Result Value Ref Range Status   Specimen Description BLOOD LEFT ANTECUBITAL  Final   Special Requests   Final    BOTTLES DRAWN AEROBIC AND ANAEROBIC Blood Culture adequate volume   Culture   Setup Time   Final    GRAM POSITIVE COCCI IN BOTH AEROBIC AND ANAEROBIC BOTTLES CRITICAL RESULT CALLED TO, READ BACK BY AND VERIFIED WITH: G. ABBOTT PHARMD, AT 0724 06/29/18 BY D. VANHOOK    Culture (A)  Final    STAPHYLOCOCCUS AUREUS SUSCEPTIBILITIES PERFORMED ON PREVIOUS CULTURE WITHIN THE LAST 5 DAYS. Performed at [facility], [address] [place], [place] [address]    Report Status 07/01/2018 FINAL  Final  Culture, blood (routine x 2)     Status: Abnormal   Collection Time: 06/28/18  3:35 PM  Result Value Ref Range Status   Specimen Description BLOOD LEFT HAND  Final   Special Requests   Final    BOTTLES DRAWN AEROBIC AND ANAEROBIC Blood  Culture adequate volume   Culture  Setup Time   Final    GRAM POSITIVE COCCI IN BOTH AEROBIC AND ANAEROBIC BOTTLES CRITICAL RESULT CALLED TO, READ BACK BY AND VERIFIED WITH: G. ABBOTT PHARMD, AT 0724 06/29/18 BY D. VANHOOK    Culture (A)  Final    STAPHYLOCOCCUS AUREUS BACILLUS SPECIES Standardized susceptibility testing for this organism is not available. CRITICAL RESULT CALLED TO, READ BACK BY AND VERIFIED WITH: Patrick North PHARMD, AT 1519 06/30/18 BY D. VANHOOK REGARDING CULTURE GROWTH Performed at [facility], [address] [place], [place] [address]    Report Status 07/01/2018 FINAL  Final   Organism ID, Bacteria STAPHYLOCOCCUS AUREUS  Final      Susceptibility   Staphylococcus aureus - MIC*    CIPROFLOXACIN <=0.5 SENSITIVE Sensitive     ERYTHROMYCIN <=0.25 SENSITIVE Sensitive     GENTAMICIN <=0.5 SENSITIVE Sensitive     OXACILLIN 0.5 SENSITIVE Sensitive     TETRACYCLINE <=1 SENSITIVE Sensitive     VANCOMYCIN <=0.5 SENSITIVE Sensitive     TRIMETH/SULFA <=10 SENSITIVE Sensitive     CLINDAMYCIN <=0.25 SENSITIVE Sensitive     RIFAMPIN <=0.5 SENSITIVE Sensitive     Inducible Clindamycin NEGATIVE Sensitive     * STAPHYLOCOCCUS AUREUS  Blood Culture ID Panel (Reflexed)     Status: Abnormal   Collection Time: 06/28/18   3:35 PM  Result Value Ref Range Status   Enterococcus species NOT DETECTED NOT DETECTED Final   Listeria monocytogenes NOT DETECTED NOT DETECTED Final   Staphylococcus species DETECTED (A) NOT DETECTED Final    Comment: CRITICAL RESULT CALLED TO, READ BACK BY AND VERIFIED WITH: G. ABBOTT PHARMD, AT 4034 06/29/18 BY D. VANHOOK    Staphylococcus aureus DETECTED (A) NOT DETECTED Final    Comment: Methicillin (oxacillin) susceptible Staphylococcus aureus (MSSA). Preferred therapy is anti staphylococcal beta lactam antibiotic (Cefazolin or Nafcillin), unless clinically contraindicated. CRITICAL RESULT CALLED TO, READ BACK BY AND VERIFIED WITH: G. ABBOTT PHARMD, AT 0724 06/29/18 BY D. VANHOOK    Methicillin resistance NOT DETECTED NOT DETECTED Final   Streptococcus species NOT DETECTED NOT DETECTED Final   Streptococcus agalactiae NOT DETECTED NOT DETECTED Final   Streptococcus pneumoniae NOT DETECTED NOT DETECTED Final   Streptococcus pyogenes NOT DETECTED NOT DETECTED Final   Acinetobacter baumannii NOT DETECTED NOT DETECTED Final   Enterobacteriaceae species NOT DETECTED NOT DETECTED Final   Enterobacter cloacae complex NOT DETECTED NOT DETECTED Final   Escherichia coli NOT DETECTED NOT DETECTED Final   Klebsiella oxytoca NOT DETECTED NOT DETECTED Final   Klebsiella pneumoniae NOT DETECTED NOT DETECTED Final   Proteus species NOT DETECTED NOT DETECTED Final   Serratia marcescens NOT DETECTED NOT DETECTED Final   Haemophilus influenzae NOT DETECTED NOT DETECTED Final   Neisseria meningitidis NOT DETECTED NOT DETECTED Final   Pseudomonas aeruginosa NOT DETECTED NOT DETECTED Final   Candida albicans NOT DETECTED NOT DETECTED Final   Candida glabrata NOT DETECTED NOT DETECTED Final   Candida krusei NOT DETECTED NOT DETECTED Final   Candida parapsilosis NOT DETECTED NOT DETECTED Final   Candida tropicalis NOT DETECTED NOT DETECTED Final    Comment: Performed at [facility], [address] [place], [place] [address]  Aerobic/Anaerobic Culture (surgical/deep wound)     Status: None (Preliminary result)   Collection Time: 06/28/18  5:01 PM  Result Value Ref Range Status   Specimen Description ABSCESS LUMBAR  Final   Special Requests NONE  Final  Gram Stain   Final    ABUNDANT WBC PRESENT, PREDOMINANTLY PMN ABUNDANT GRAM POSITIVE COCCI IN PAIRS IN CLUSTERS Performed at [facility], [address], [place], [place] [address]    Culture   Final    MODERATE STAPHYLOCOCCUS AUREUS NO ANAEROBES ISOLATED; CULTURE IN PROGRESS FOR 5 DAYS    Report Status PENDING  Incomplete   Organism ID, Bacteria STAPHYLOCOCCUS AUREUS  Final      Susceptibility   Staphylococcus aureus - MIC*    CIPROFLOXACIN <=0.5 SENSITIVE Sensitive     ERYTHROMYCIN <=0.25 SENSITIVE Sensitive     GENTAMICIN <=0.5 SENSITIVE Sensitive     OXACILLIN <=0.25 SENSITIVE Sensitive     TETRACYCLINE <=1 SENSITIVE Sensitive     VANCOMYCIN <=0.5 SENSITIVE Sensitive     TRIMETH/SULFA <=10 SENSITIVE Sensitive     CLINDAMYCIN <=0.25 SENSITIVE Sensitive     RIFAMPIN <=0.5 SENSITIVE Sensitive     Inducible Clindamycin NEGATIVE Sensitive     * MODERATE STAPHYLOCOCCUS AUREUS  MRSA PCR Screening     Status: None   Collection Time: 06/29/18  4:14 PM  Result Value Ref Range Status   MRSA by PCR NEGATIVE NEGATIVE Final    Comment:        The GeneXpert MRSA Assay (FDA approved for NASAL specimens only), is one component of a comprehensive MRSA colonization surveillance program. It is not intended to diagnose MRSA infection nor to guide or monitor treatment for MRSA infections. Performed at [facility], [address] [place], [place] [address]   Aerobic/Anaerobic Culture (surgical/deep wound)     Status: None (Preliminary result)   Collection Time: 06/29/18  6:08 PM  Result Value Ref Range Status   Specimen Description FLUID LEFT KNEE  Final   Special Requests NONE  Final   Gram Stain   Final     ABUNDANT WBC PRESENT, PREDOMINANTLY PMN RARE GRAM POSITIVE COCCI RESULT CALLED TO, READ BACK BY AND VERIFIED WITH: DR Otelia Sergeant 06/29/18 2012 Performed at [facility], [address], [place], [place] [address]    Culture   Final    RARE STAPHYLOCOCCUS AUREUS NO ANAEROBES ISOLATED; CULTURE IN PROGRESS FOR 5 DAYS    Report Status PENDING  Incomplete   Organism ID, Bacteria STAPHYLOCOCCUS AUREUS  Final      Susceptibility   Staphylococcus aureus - MIC*    CIPROFLOXACIN <=0.5 SENSITIVE Sensitive     ERYTHROMYCIN <=0.25 SENSITIVE Sensitive     GENTAMICIN <=0.5 SENSITIVE Sensitive     OXACILLIN 0.5 SENSITIVE Sensitive     TETRACYCLINE <=1 SENSITIVE Sensitive     VANCOMYCIN <=0.5 SENSITIVE Sensitive     TRIMETH/SULFA <=10 SENSITIVE Sensitive     CLINDAMYCIN <=0.25 SENSITIVE Sensitive     RIFAMPIN <=0.5 SENSITIVE Sensitive     Inducible Clindamycin NEGATIVE Sensitive     * RARE STAPHYLOCOCCUS AUREUS  Aerobic/Anaerobic Culture (surgical/deep wound)     Status: None (Preliminary result)   Collection Time: 06/29/18  7:31 PM  Result Value Ref Range Status   Specimen Description ABSCESS  Final   Special Requests LUMBAR ABSCESS  Final   Gram Stain   Final    ABUNDANT WBC PRESENT,BOTH PMN AND MONONUCLEAR ABUNDANT GRAM POSITIVE COCCI Performed at [facility], [address], [place], [place] [address]    Culture   Final    ABUNDANT STAPHYLOCOCCUS AUREUS NO ANAEROBES ISOLATED; CULTURE IN PROGRESS FOR 5 DAYS    Report Status PENDING  Incomplete  Organism ID, Bacteria STAPHYLOCOCCUS AUREUS  Final      Susceptibility   Staphylococcus aureus - MIC*    CIPROFLOXACIN <=0.5 SENSITIVE Sensitive     ERYTHROMYCIN <=0.25 SENSITIVE Sensitive     GENTAMICIN <=0.5 SENSITIVE Sensitive     OXACILLIN 0.5 SENSITIVE Sensitive     TETRACYCLINE <=1 SENSITIVE Sensitive     VANCOMYCIN 1 SENSITIVE Sensitive     TRIMETH/SULFA <=10 SENSITIVE Sensitive     CLINDAMYCIN <=0.25 SENSITIVE Sensitive      RIFAMPIN <=0.5 SENSITIVE Sensitive     Inducible Clindamycin NEGATIVE Sensitive     * ABUNDANT STAPHYLOCOCCUS AUREUS  Culture, blood (routine x 2)     Status: None (Preliminary result)   Collection Time: 06/30/18  4:52 AM  Result Value Ref Range Status   Specimen Description BLOOD LEFT ARM  Final   Special Requests   Final    BOTTLES DRAWN AEROBIC AND ANAEROBIC Blood Culture adequate volume   Culture   Final    NO GROWTH 3 DAYS Performed at [facility], [address] [place], [place] [address]    Report Status PENDING  Incomplete  Culture, blood (routine x 2)     Status: None (Preliminary result)   Collection Time: 06/30/18  4:55 AM  Result Value Ref Range Status   Specimen Description BLOOD LEFT WRIST  Final   Special Requests   Final    BOTTLES DRAWN AEROBIC AND ANAEROBIC Blood Culture adequate volume   Culture   Final    NO GROWTH 3 DAYS Performed at [facility], [address] [place], [place] [address]    Report Status PENDING  Incomplete     Studies: No results found.  Scheduled Meds: . acetaminophen  1,000 mg Oral Q6H  . calcium-vitamin D  1 tablet Oral Daily  . DULoxetine  60 mg Oral Daily  . enoxaparin (LOVENOX) injection  30 mg Subcutaneous Q24H  . HYDROcodone-acetaminophen  1 tablet Oral Q6H  . pantoprazole  40 mg Oral BID AC  . polyethylene glycol  17 g Oral Daily  . senna-docusate  1 tablet Oral BID  . sodium chloride flush  3 mL Intravenous Q12H  . tamsulosin  0.4 mg Oral Daily  . vitamin B-12  1,000 mcg Oral Daily    Continuous Infusions: . sodium chloride 150 mL/hr at 07/03/18 0731  . sodium chloride    .  ceFAZolin (ANCEF) IV 2 g (07/03/18 [id number])  . methocarbamol (ROBAXIN) IV       Time spent: 25 mins, case discussed with ID I have personally reviewed and interpreted on  07/03/2018 daily labs, imagings as discussed above under date review session and assessment and plans.  I reviewed all nursing notes, pharmacy notes, consultant  notes,  vitals, pertinent old records  I have discussed plan of care as described above with RN , patient and family on 07/03/2018   Albertine GratesFang Ashtin Rosner MD, PhD  [facility] Pager [phone]. If 7PM-7AM, please contact night-coverage at [email or web address], password [email or web address] 07/03/2018, 10:11 AM  LOS: 5 days

## 2018-07-03 NOTE — Progress Notes (Signed)
S: Feels better O:BP 130/81 (BP Location: Right Arm)   Pulse 76   Temp 97.7 F (36.5 C) (Oral)   Resp 15   Ht 5\' 10"  (1.778 m)   Wt (!) 147.8 kg   SpO2 97%   BMI 46.75 kg/m   Intake/Output Summary (Last 24 hours) at 07/03/2018 1255 Last data filed at 07/03/2018 0700 Gross per 24 hour  Intake 960 ml  Output 3950 ml  Net -2990 ml   Intake/Output: I/O last 3 completed shifts: In: 1200 [P.O.:1200] Out: 5440 [Urine:5200; Drains:240]  Intake/Output this shift:  No intake/output data recorded. Weight change:  Gen: NAD CVS: no rub Resp: cta Abd: obese, +BS, soft, NT Ext: no edema  Recent Labs  Lab 06/28/18 0002 06/29/18 0310 06/30/18 0452 07/01/18 0501 07/02/18 0318 07/02/18 0701 07/03/18 0425  NA 141 141 141 137 140  --  141  K 4.0 3.6 3.5 3.8 4.5  --  3.7  CL 101 107 108 104 112*  --  111  CO2 27 24 22  21* 19*  --  23  GLUCOSE 147* 187* 158* 138* 96  --  111*  BUN 26* 42* 58* 79* 81*  --  43*  CREATININE 1.77* 2.12* 2.47* 2.99* 2.43*  --  1.23  ALBUMIN 3.0* 2.6*  --   --  1.9*  --  2.0*  CALCIUM 9.0 8.6* 8.4* 8.1* 7.9*  --  8.0*  AST 47* 59*  --   --  QUANTITY NOT SUFFICIENT, UNABLE TO PERFORM TEST 55* 32  ALT 21 28  --   --  QUANTITY NOT SUFFICIENT, UNABLE TO PERFORM TEST 16 11   Liver Function Tests: Recent Labs  Lab 06/29/18 0310 07/02/18 0318 07/02/18 0701 07/03/18 0425  AST 59* QUANTITY NOT SUFFICIENT, UNABLE TO PERFORM TEST 55* 32  ALT 28 QUANTITY NOT SUFFICIENT, UNABLE TO PERFORM TEST 16 11  ALKPHOS 99 102  --  114  BILITOT 1.6* QUANTITY NOT SUFFICIENT, UNABLE TO PERFORM TEST 0.7 0.9  PROT 6.7 5.6*  --  5.9*  ALBUMIN 2.6* 1.9*  --  2.0*   No results for input(s): LIPASE, AMYLASE in the last 168 hours. No results for input(s): AMMONIA in the last 168 hours. CBC: Recent Labs  Lab 06/28/18 0002 06/29/18 0310 06/30/18 0452 07/02/18 0318 07/03/18 0425  WBC 13.5* 12.6* 13.7* 11.4* 14.9*  NEUTROABS 12.5*  --   --   --  13.2*  HGB 14.2 13.5 12.5*  11.4* 11.1*  HCT 44.5 42.6 38.9* 35.1* 34.7*  MCV 89.4 88.4 87.4 86.5 86.3  PLT 137* 144* 124* 105* 115*   Cardiac Enzymes: Recent Labs  Lab 07/01/18 1651  CKTOTAL 89   CBG: No results for input(s): GLUCAP in the last 168 hours.  Iron Studies: No results for input(s): IRON, TIBC, TRANSFERRIN, FERRITIN in the last 72 hours. Studies/Results: No results found. Marland Kitchen. acetaminophen  1,000 mg Oral Q6H  . calcium-vitamin D  1 tablet Oral Daily  . DULoxetine  60 mg Oral Daily  . enoxaparin (LOVENOX) injection  30 mg Subcutaneous Q24H  . HYDROcodone-acetaminophen  1 tablet Oral Q6H  . mineral oil  1 enema Rectal Once  . pantoprazole  40 mg Oral BID AC  . polyethylene glycol  17 g Oral BID  . senna-docusate  1 tablet Oral BID  . sodium chloride flush  3 mL Intravenous Q12H  . tamsulosin  0.4 mg Oral Daily  . vitamin B-12  1,000 mcg Oral Daily    BMET  Component Value Date/Time   NA 141 07/03/2018 0425   K 3.7 07/03/2018 0425   CL 111 07/03/2018 0425   CO2 23 07/03/2018 0425   GLUCOSE 111 (H) 07/03/2018 0425   BUN 43 (H) 07/03/2018 0425   CREATININE 1.23 07/03/2018 0425   CREATININE 0.86 03/26/2013 0907   CALCIUM 8.0 (L) 07/03/2018 0425   GFRNONAA >60 07/03/2018 0425   GFRAA >60 07/03/2018 0425   CBC    Component Value Date/Time   WBC 14.9 (H) 07/03/2018 0425   RBC 4.02 (L) 07/03/2018 0425   HGB 11.1 (L) 07/03/2018 0425   HCT 34.7 (L) 07/03/2018 0425   PLT 115 (L) 07/03/2018 0425   MCV 86.3 07/03/2018 0425   MCH 27.6 07/03/2018 0425   MCHC 32.0 07/03/2018 0425   RDW 15.9 (H) 07/03/2018 0425   LYMPHSABS 1.0 07/03/2018 0425   MONOABS 0.6 07/03/2018 0425   EOSABS 0.1 07/03/2018 0425   BASOSABS 0.0 07/03/2018 0425     Assessment/Plan: 1. AKI/CKD stage 2 (presumably due to diabetic nephropathy)- in setting of MSSA bacteremia and lumbar abscess. He had been receiving NSAIDs prior to admission and had some nephrotoxic agents at admission but none since. His FeNa was  <1% consistent with pre-renal but could also be seen in acute GN.  1. Responding to increased IVF"s at 145ml/hr ok to decrease rate as he is taking po. 2. acute GN workup; negative ASO, ANA, dsDNA, ANCA, and normal complements.  Free light chains elevated but SPEP normal and consistent with inflammation. 3. Likely had some NSAID nephropathy upon admission which worsened with surgery and acute illness and urinary retention.  4. Creatinine has returned to baseline 5. avoid nephrotoxic agents, especially NSAIDs  6. He will need f/u with his PCP and referral to our practice for follow up if renal function declines 7. Nothing further to add.  Will sign off.  Please call with any questions or concerns.  2. MSSA bacteremia/septic arthritis/lumbar epidural abscess- on Ancef per ID and TEE negative 3. Epidural abscess wound- will require wound vac, WOC nurse consulted 4. Urinary retention- due to epidural abscess s/p foley catheter without obstruction 1. Spoke with Dr. Otelia Sergeant and agree with trial off of foley and follow bladder scans 5. Nephrolithiasis- non-obstructing stones 6. DM- poorly controlled, insulin per primary svc 7. Morbid obesity 8. HTN- avoid ACE/ARB for now 9. Proteinuria- will check prot/creat ratio and check SPEP/UPEP 10. Moderate protein malnutrition 11. OSA not treated with CPAP 12. Disposition- agree with CIR consult as he will need rehab  Irena Cords, MD [facility] [phone]

## 2018-07-03 NOTE — Progress Notes (Addendum)
Subjective: This morning patient states he is doing better.  Back pain improved after switching back to a regular hospital bed.  Wound care nurse in the room for VAC change.   Objective: Vital signs in last 24 hours: Temp:  [97.7 F (36.5 C)-97.9 F (36.6 C)] 97.7 F (36.5 C) (09/19 1953) Pulse Rate:  [76-90] 76 (09/20 0628) Resp:  [15-16] 15 (09/20 0628) BP: (130-156)/(81-87) 130/81 (09/20 0628) SpO2:  [96 %-98 %] 97 % (09/20 0628)  Intake/Output from previous day: 09/19 0701 - 09/20 0700 In: 1200 [P.O.:1200] Out: 4350 [Urine:4200; Drains:150] Intake/Output this shift: No intake/output data recorded.  Recent Labs    07/02/18 0318 07/03/18 0425  HGB 11.4* 11.1*   Recent Labs    07/02/18 0318 07/03/18 0425  WBC 11.4* 14.9*  RBC 4.06* 4.02*  HCT 35.1* 34.7*  PLT 105* 115*   Recent Labs    07/02/18 0318 07/03/18 0425  NA 140 141  K 4.5 3.7  CL 112* 111  CO2 19* 23  BUN 81* 43*  CREATININE 2.43* 1.23  GLUCOSE 96 111*  CALCIUM 7.9* 8.0*   No results for input(s): LABPT, INR in the last 72 hours.  Exam Again his wound is looking good.  Less bleeding around wound bed.  No gross signs of infection.  Left knee no gross signs of infection.  Nontender.      Assessment/Plan: New wound VAC applied.  Continue present care.  Creatinine much improved.  On-call team to follow over the weekend. I spoke with Dr Ophelia CharterYates and we discussed patients care up to this point since Dr Otelia SergeantNitka is out.     Zonia KiefJames Brittain Hosie 07/03/2018, 9:56 AM

## 2018-07-03 NOTE — Progress Notes (Signed)
[facility]  Date of Admission:  06/27/2018   Total days of antibiotics 5        Day 5 Ancef         ASSESSMENT: Lumbar abscess (debrided 9-16) MSSA bacteremia AKI (resolving, Cr 1.2) L supraspinatus cuff tear  PLAN: 1. Continue Ancef IV for 6 weeks until 10/28, OPAT order placed 2. Place PIC once BCx NG at 4 days which is 9/21  3. Appreciate nephrology input 4. F/u in ID clinic in 4 weeks  Principal Problem:   Spondylosis, thoracic, with myelopathy Active Problems:   Thoracic myelopathy   Abscess in epidural space of L2-L5 lumbar spine   Acute urinary retention   Left cervical radiculopathy   Leukocytosis   Postoperative seroma involving nervous system after nervous system procedure   Shoulder pain, bilateral   Septic arthritis of knee, left (HCC)   Complete tear of left rotator cuff   Scheduled Meds: . acetaminophen  1,000 mg Oral Q6H  . calcium-vitamin D  1 tablet Oral Daily  . DULoxetine  60 mg Oral Daily  . enoxaparin (LOVENOX) injection  30 mg Subcutaneous Q24H  . HYDROcodone-acetaminophen  1 tablet Oral Q6H  . pantoprazole  40 mg Oral BID AC  . polyethylene glycol  17 g Oral Daily  . senna-docusate  1 tablet Oral BID  . sodium chloride flush  3 mL Intravenous Q12H  . tamsulosin  0.4 mg Oral Daily  . vitamin B-12  1,000 mcg Oral Daily   Continuous Infusions: . sodium chloride 150 mL/hr at 07/03/18 0731  . sodium chloride    .  ceFAZolin (ANCEF) IV 2 g (07/03/18 [id number])  . methocarbamol (ROBAXIN) IV     PRN Meds:.acetaminophen **OR** acetaminophen, alum & mag hydroxide-simeth, bisacodyl, HYDROcodone-acetaminophen, menthol-cetylpyridinium **OR** phenol, methocarbamol **OR** methocarbamol (ROBAXIN) IV, morphine injection, ondansetron **OR** ondansetron (ZOFRAN) IV, sodium chloride flush, sodium phosphate, zolpidem   SUBJECTIVE: He feels much better today. No complaints. He is happy about TEE results and improved kidney  function. Much more comfortable in new bed.   Review of Systems: Review of Systems  Constitutional: Negative.  Negative for chills, diaphoresis and fever.  Respiratory: Negative.   Cardiovascular: Negative.   Gastrointestinal: Negative.   Musculoskeletal: Negative for back pain.    Allergies  Allergen Reactions  . Unasyn [Ampicillin-Sulbactam Sodium] Hives, Itching, Swelling and Rash    OBJECTIVE: Vitals:   07/02/18 0530 07/02/18 1515 07/02/18 1953 07/03/18 0628  BP: 129/74 (!) 146/81 (!) 156/87 130/81  Pulse: 73 90 80 76  Resp: 20 16 16 15   Temp: 97.7 F (36.5 C) 97.9 F (36.6 C) 97.7 F (36.5 C)   TempSrc: Oral Oral Oral Oral  SpO2: 98% 96% 98% 97%  Weight:      Height:       Body mass index is 46.75 kg/m.  Physical Exam  Constitutional: He is oriented to person, place, and time. He appears well-developed. No distress.  Eyes: Conjunctivae are normal.  Cardiovascular: Normal rate and regular rhythm.  No murmur heard. Pulmonary/Chest: Effort normal and breath sounds normal.  Abdominal: Soft. Bowel sounds are normal. He exhibits no distension. There is no tenderness.  Musculoskeletal:  Back nontender, wound dressed, wound vac in place draining serosanguinous fluid  Neurological: He is alert and oriented to person, place, and time.  Skin: Skin is warm.    Lab Results Lab Results  Component Value Date   WBC 14.9 (H) 07/03/2018  HGB 11.1 (L) 07/03/2018   HCT 34.7 (L) 07/03/2018   MCV 86.3 07/03/2018   PLT 115 (L) 07/03/2018    Lab Results  Component Value Date   CREATININE 1.23 07/03/2018   BUN 43 (H) 07/03/2018   NA 141 07/03/2018   K 3.7 07/03/2018   CL 111 07/03/2018   CO2 23 07/03/2018    Lab Results  Component Value Date   ALT 11 07/03/2018   AST 32 07/03/2018   ALKPHOS 114 07/03/2018   BILITOT 0.9 07/03/2018     Microbiology: Recent Results (from the past 240 hour(s))  Culture, Urine     Status: Abnormal   Collection Time: 06/28/18 11:59  AM  Result Value Ref Range Status   Specimen Description URINE, CATHETERIZED  Final   Special Requests   Final    Normal Performed at [facility], [address], [place], [place] [address]    Culture MULTIPLE SPECIES PRESENT, SUGGEST RECOLLECTION (A)  Final   Report Status 06/29/2018 FINAL  Final  Culture, blood (routine x 2)     Status: Abnormal   Collection Time: 06/28/18  3:30 PM  Result Value Ref Range Status   Specimen Description BLOOD LEFT ANTECUBITAL  Final   Special Requests   Final    BOTTLES DRAWN AEROBIC AND ANAEROBIC Blood Culture adequate volume   Culture  Setup Time   Final    GRAM POSITIVE COCCI IN BOTH AEROBIC AND ANAEROBIC BOTTLES CRITICAL RESULT CALLED TO, READ BACK BY AND VERIFIED WITH: G. ABBOTT PHARMD, AT 0724 06/29/18 BY D. VANHOOK    Culture (A)  Final    STAPHYLOCOCCUS AUREUS SUSCEPTIBILITIES PERFORMED ON PREVIOUS CULTURE WITHIN THE LAST 5 DAYS. Performed at [facility], [address], [place], [place] [address]    Report Status 07/01/2018 FINAL  Final  Culture, blood (routine x 2)     Status: Abnormal   Collection Time: 06/28/18  3:35 PM  Result Value Ref Range Status   Specimen Description BLOOD LEFT HAND  Final   Special Requests   Final    BOTTLES DRAWN AEROBIC AND ANAEROBIC Blood Culture adequate volume   Culture  Setup Time   Final    GRAM POSITIVE COCCI IN BOTH AEROBIC AND ANAEROBIC BOTTLES CRITICAL RESULT CALLED TO, READ BACK BY AND VERIFIED WITH: G. ABBOTT PHARMD, AT 0724 06/29/18 BY D. VANHOOK    Culture (A)  Final    STAPHYLOCOCCUS AUREUS BACILLUS SPECIES Standardized susceptibility testing for this organism is not available. CRITICAL RESULT CALLED TO, READ BACK BY AND VERIFIED WITH: Patrick North PHARMD, AT 1519 06/30/18 BY D. VANHOOK REGARDING CULTURE GROWTH Performed at [facility], [address], [place], [place] [address]    Report Status 07/01/2018 FINAL  Final   Organism ID, Bacteria STAPHYLOCOCCUS AUREUS   Final      Susceptibility   Staphylococcus aureus - MIC*    CIPROFLOXACIN <=0.5 SENSITIVE Sensitive     ERYTHROMYCIN <=0.25 SENSITIVE Sensitive     GENTAMICIN <=0.5 SENSITIVE Sensitive     OXACILLIN 0.5 SENSITIVE Sensitive     TETRACYCLINE <=1 SENSITIVE Sensitive     VANCOMYCIN <=0.5 SENSITIVE Sensitive     TRIMETH/SULFA <=10 SENSITIVE Sensitive     CLINDAMYCIN <=0.25 SENSITIVE Sensitive     RIFAMPIN <=0.5 SENSITIVE Sensitive     Inducible Clindamycin NEGATIVE Sensitive     * STAPHYLOCOCCUS AUREUS  Blood Culture ID Panel (Reflexed)     Status: Abnormal   Collection Time:  06/28/18  3:35 PM  Result Value Ref Range Status   Enterococcus species NOT DETECTED NOT DETECTED Final   Listeria monocytogenes NOT DETECTED NOT DETECTED Final   Staphylococcus species DETECTED (A) NOT DETECTED Final    Comment: CRITICAL RESULT CALLED TO, READ BACK BY AND VERIFIED WITH: G. ABBOTT PHARMD, AT 1610 06/29/18 BY D. VANHOOK    Staphylococcus aureus DETECTED (A) NOT DETECTED Final    Comment: Methicillin (oxacillin) susceptible Staphylococcus aureus (MSSA). Preferred therapy is anti staphylococcal beta lactam antibiotic (Cefazolin or Nafcillin), unless clinically contraindicated. CRITICAL RESULT CALLED TO, READ BACK BY AND VERIFIED WITH: G. ABBOTT PHARMD, AT 0724 06/29/18 BY D. VANHOOK    Methicillin resistance NOT DETECTED NOT DETECTED Final   Streptococcus species NOT DETECTED NOT DETECTED Final   Streptococcus agalactiae NOT DETECTED NOT DETECTED Final   Streptococcus pneumoniae NOT DETECTED NOT DETECTED Final   Streptococcus pyogenes NOT DETECTED NOT DETECTED Final   Acinetobacter baumannii NOT DETECTED NOT DETECTED Final   Enterobacteriaceae species NOT DETECTED NOT DETECTED Final   Enterobacter cloacae complex NOT DETECTED NOT DETECTED Final   Escherichia coli NOT DETECTED NOT DETECTED Final   Klebsiella oxytoca NOT DETECTED NOT DETECTED Final   Klebsiella pneumoniae NOT DETECTED NOT DETECTED  Final   Proteus species NOT DETECTED NOT DETECTED Final   Serratia marcescens NOT DETECTED NOT DETECTED Final   Haemophilus influenzae NOT DETECTED NOT DETECTED Final   Neisseria meningitidis NOT DETECTED NOT DETECTED Final   Pseudomonas aeruginosa NOT DETECTED NOT DETECTED Final   Candida albicans NOT DETECTED NOT DETECTED Final   Candida glabrata NOT DETECTED NOT DETECTED Final   Candida krusei NOT DETECTED NOT DETECTED Final   Candida parapsilosis NOT DETECTED NOT DETECTED Final   Candida tropicalis NOT DETECTED NOT DETECTED Final    Comment: Performed at [facility], [address], [place], [place] [address]  Aerobic/Anaerobic Culture (surgical/deep wound)     Status: None (Preliminary result)   Collection Time: 06/28/18  5:01 PM  Result Value Ref Range Status   Specimen Description ABSCESS LUMBAR  Final   Special Requests NONE  Final   Gram Stain   Final    ABUNDANT WBC PRESENT, PREDOMINANTLY PMN ABUNDANT GRAM POSITIVE COCCI IN PAIRS IN CLUSTERS Performed at [facility], [address], [place], [place] [address]    Culture   Final    MODERATE STAPHYLOCOCCUS AUREUS NO ANAEROBES ISOLATED; CULTURE IN PROGRESS FOR 5 DAYS    Report Status PENDING  Incomplete   Organism ID, Bacteria STAPHYLOCOCCUS AUREUS  Final      Susceptibility   Staphylococcus aureus - MIC*    CIPROFLOXACIN <=0.5 SENSITIVE Sensitive     ERYTHROMYCIN <=0.25 SENSITIVE Sensitive     GENTAMICIN <=0.5 SENSITIVE Sensitive     OXACILLIN <=0.25 SENSITIVE Sensitive     TETRACYCLINE <=1 SENSITIVE Sensitive     VANCOMYCIN <=0.5 SENSITIVE Sensitive     TRIMETH/SULFA <=10 SENSITIVE Sensitive     CLINDAMYCIN <=0.25 SENSITIVE Sensitive     RIFAMPIN <=0.5 SENSITIVE Sensitive     Inducible Clindamycin NEGATIVE Sensitive     * MODERATE STAPHYLOCOCCUS AUREUS  MRSA PCR Screening     Status: None   Collection Time: 06/29/18  4:14 PM  Result Value Ref Range Status   MRSA by PCR NEGATIVE NEGATIVE Final     Comment:        The GeneXpert MRSA Assay (FDA approved for NASAL specimens only), is one component of a comprehensive MRSA colonization  surveillance program. It is not intended to diagnose MRSA infection nor to guide or monitor treatment for MRSA infections. Performed at [facility], [address], [place], [place] [address]   Aerobic/Anaerobic Culture (surgical/deep wound)     Status: None (Preliminary result)   Collection Time: 06/29/18  6:08 PM  Result Value Ref Range Status   Specimen Description FLUID LEFT KNEE  Final   Special Requests NONE  Final   Gram Stain   Final    ABUNDANT WBC PRESENT, PREDOMINANTLY PMN RARE GRAM POSITIVE COCCI RESULT CALLED TO, READ BACK BY AND VERIFIED WITH: DR Otelia Sergeant 06/29/18 2012 Performed at [facility], [address], [place], [place] [address]    Culture   Final    RARE STAPHYLOCOCCUS AUREUS NO ANAEROBES ISOLATED; CULTURE IN PROGRESS FOR 5 DAYS    Report Status PENDING  Incomplete   Organism ID, Bacteria STAPHYLOCOCCUS AUREUS  Final      Susceptibility   Staphylococcus aureus - MIC*    CIPROFLOXACIN <=0.5 SENSITIVE Sensitive     ERYTHROMYCIN <=0.25 SENSITIVE Sensitive     GENTAMICIN <=0.5 SENSITIVE Sensitive     OXACILLIN 0.5 SENSITIVE Sensitive     TETRACYCLINE <=1 SENSITIVE Sensitive     VANCOMYCIN <=0.5 SENSITIVE Sensitive     TRIMETH/SULFA <=10 SENSITIVE Sensitive     CLINDAMYCIN <=0.25 SENSITIVE Sensitive     RIFAMPIN <=0.5 SENSITIVE Sensitive     Inducible Clindamycin NEGATIVE Sensitive     * RARE STAPHYLOCOCCUS AUREUS  Aerobic/Anaerobic Culture (surgical/deep wound)     Status: None (Preliminary result)   Collection Time: 06/29/18  7:31 PM  Result Value Ref Range Status   Specimen Description ABSCESS  Final   Special Requests LUMBAR ABSCESS  Final   Gram Stain   Final    ABUNDANT WBC PRESENT,BOTH PMN AND MONONUCLEAR ABUNDANT GRAM POSITIVE COCCI Performed at [facility], [address] [place],  [place] [address]    Culture   Final    ABUNDANT STAPHYLOCOCCUS AUREUS NO ANAEROBES ISOLATED; CULTURE IN PROGRESS FOR 5 DAYS    Report Status PENDING  Incomplete   Organism ID, Bacteria STAPHYLOCOCCUS AUREUS  Final      Susceptibility   Staphylococcus aureus - MIC*    CIPROFLOXACIN <=0.5 SENSITIVE Sensitive     ERYTHROMYCIN <=0.25 SENSITIVE Sensitive     GENTAMICIN <=0.5 SENSITIVE Sensitive     OXACILLIN 0.5 SENSITIVE Sensitive     TETRACYCLINE <=1 SENSITIVE Sensitive     VANCOMYCIN 1 SENSITIVE Sensitive     TRIMETH/SULFA <=10 SENSITIVE Sensitive     CLINDAMYCIN <=0.25 SENSITIVE Sensitive     RIFAMPIN <=0.5 SENSITIVE Sensitive     Inducible Clindamycin NEGATIVE Sensitive     * ABUNDANT STAPHYLOCOCCUS AUREUS  Culture, blood (routine x 2)     Status: None (Preliminary result)   Collection Time: 06/30/18  4:52 AM  Result Value Ref Range Status   Specimen Description BLOOD LEFT ARM  Final   Special Requests   Final    BOTTLES DRAWN AEROBIC AND ANAEROBIC Blood Culture adequate volume   Culture   Final    NO GROWTH 3 DAYS Performed at [facility], [address] [place], [place] [address]    Report Status PENDING  Incomplete  Culture, blood (routine x 2)     Status: None (Preliminary result)   Collection Time: 06/30/18  4:55 AM  Result Value Ref Range Status   Specimen Description BLOOD LEFT WRIST  Final  Special Requests   Final    BOTTLES DRAWN AEROBIC AND ANAEROBIC Blood Culture adequate volume   Culture   Final    NO GROWTH 3 DAYS Performed at [facility], [address] [place], [place] [address]    Report Status PENDING  Incomplete    Gweneth Fritter, [facility] for Infectious [facility]  07/03/2018, 10:10 AM

## 2018-07-03 NOTE — Consult Note (Signed)
Physical Medicine and Rehabilitation Consult Reason for Consult: Decreased functional mobility Referring Physician: Dr. Otelia Sergeant   HPI: [name] [name] is a 62 y.o. right-handed male admitted 06/28/2018 with progressive lower extremity weakness as well as falls.  Per chart review patient lives with spouse.  Needed assistance for ADLs prior to admission ambulated short distance with a rolling walker was receiving outpatient therapies.  Noted history of lumbar multilevel central laminectomy for recurrent lumbar spinal stenosis with multiple back surgeries and resultant foot drop.  MRI thoracic lumbar spine showed no fracture or malalignment.  Subcentimeter cord edema T7-8 no syrinx.  Moderate to severe paraspinal muscle atrophy.  Multilevel neuroforaminal narrowing severe at T2-3, T9-10 and T10-11 as well as 2.9 x 4.3 x 6.1 fluid collection right L4-5 facet effusion..  Patient underwent lumbar wound debridement drainage irrigation 06/29/2018 per Dr. Otelia Sergeant as well as aspiration of the left knee consistent with septic arthritis.  Aspiration lumbar abscess positive for Staphylococcus aureus.  Infectious disease consulted.  TEE completed negative for vegetation.  Currently on IV Ancef awaiting plan of duration.  Nephrology consulted for elevated creatinine 2.43 and renal ultrasound showed no hydronephrosis.  Patient with complaints of left shoulder pain MRI completed showing full-thickness retracted supraspinatus tendon tear with current conservative care recommended.  A follow-up MRI cervical spine completed for neck pain left bicep weakness showing neuroforaminal narrowing C2-3 through C6-7.  Severe from C3-4 through C6-7.  Current plan is to follow-up outpatient with neurosurgery.  Patient currently on subcutaneous Lovenox for DVT prophylaxis.  Wound care nurse follow-up for surgical lumbar incision currently with a wound VAC in place and dressing changes as recommended.  Bouts of urinary retention plan voiding  trial.  Therapy evaluations have been completed.  MD requesting physical medicine rehab consult.   Review of Systems  Constitutional: Negative for fever.  HENT: Negative for hearing loss.   Eyes: Negative for blurred vision and double vision.  Respiratory: Negative for cough and shortness of breath.   Cardiovascular: Negative for chest pain and palpitations.  Gastrointestinal: Positive for constipation. Negative for nausea and vomiting.  Genitourinary: Negative for dysuria and hematuria.       Urinary retention  Musculoskeletal: Positive for back pain, falls, myalgias and neck pain.  Skin: Negative for rash.  Neurological: Positive for focal weakness.  All other systems reviewed and are negative.  Past Medical History:  Diagnosis Date  . Anemia   . Arthritis   . Chronic kidney disease    HX acute kidney failure / acute pyelonephritis / hydronephrosis / severe sepsis per discharge summary 12/27/13  . History of kidney stones   . Hypertension   . Morbid obesity (HCC)   . Obstructive sleep apnea    does not need c pap since 110 lb wt loss  . Osteoporosis   . Prurigo 2002  . Scars    ON ARMS FROM CHEMICAL EXPLOSION 1999  . Spinal stenosis    Past Surgical History:  Procedure Laterality Date  . BACK SURGERY  01/23/2010   lumbar  . CHOLECYSTECTOMY N/A 03/24/2016   Procedure: LAPAROSCOPIC CHOLECYSTECTOMY WITH INTRAOPERATIVE CHOLANGIOGRAM;  Surgeon: Chevis Pretty III, MD;  Location: [facility];  Service: General;  Laterality: N/A;  . CIRCUMCISION    . CYSTOSCOPY WITH RETROGRADE PYELOGRAM, URETEROSCOPY AND STENT PLACEMENT Left 01/20/2014   Procedure: CYSTOSCOPY WITH RETROGRADE PYELOGRAM, URETEROSCOPY AND STENT EXCHANGE;  Surgeon: Milford Cage, MD;  Location: [facility];  Service: Urology;  Laterality: Left;  bugbee bladder  fulguration  . CYSTOSCOPY WITH STENT PLACEMENT Left 12/28/2013   Procedure: CYSTOSCOPY WITH STENT PLACEMENT left retrograde pyleogram;  Surgeon: Milford Cageaniel Young Woodruff,  MD;  Location: [facility];  Service: Urology;  Laterality: Left;  . ESOPHAGOGASTRODUODENOSCOPY N/A 12/07/2012   Procedure: ESOPHAGOGASTRODUODENOSCOPY (EGD);  Surgeon: Lodema PilotBrian Layton, DO;  Location: [facility];  Service: General;  Laterality: N/A;  . HOLMIUM LASER APPLICATION Left 01/20/2014   Procedure: HOLMIUM LASER APPLICATION;  Surgeon: Milford Cageaniel Young Woodruff, MD;  Location: [facility];  Service: Urology;  Laterality: Left;  . KNEE ARTHROSCOPY Left 06/29/2018   Procedure: ARTHROSCOPY KNEE;  Surgeon: Kerrin ChampagneNitka, James E, MD;  [facility];  Service: Orthopedics;  Laterality: Left;  . LAPAROSCOPIC GASTRIC SLEEVE RESECTION N/A 12/07/2012   Procedure: LAPAROSCOPIC GASTRIC SLEEVE RESECTION;  Surgeon: Lodema PilotBrian Layton, DO;  Location: [facility];  Service: General;  Laterality: N/A;  laparoscopic sleeve gastrectomy with EGD  . LUMBAR LAMINECTOMY N/A 11/17/2017   Procedure: L2-3 LAMINECTOMY AND REDO LAMINECTOMIES  L3-4, L4-5 AND L5-S1;  Surgeon: Kerrin ChampagneNitka, James E, MD;  Location: [facility];  Service: Orthopedics;  Laterality: N/A;  . LUMBAR WOUND DEBRIDEMENT N/A 06/29/2018   Procedure: LUMBAR WOUND DEBRIDEMENT DRAINAGE AND IRRIGATION; AND ASPIRATION OF LEFT KNEE;  Surgeon: Kerrin ChampagneNitka, James E, MD;  Location: [facility];  Service: Orthopedics;  Laterality: N/A;  . TEE WITHOUT CARDIOVERSION N/A 07/01/2018   Procedure: TRANSESOPHAGEAL ECHOCARDIOGRAM (TEE);  Surgeon: Laurey MoraleMcLean, Dalton S, MD;  [facility];  Service: Cardiovascular;  Laterality: N/A;   History reviewed. No pertinent family history. Social History:  reports that he quit smoking about 44 years ago. His smoking use included cigarettes. He has never used smokeless tobacco. He reports that he does not drink alcohol or use drugs. Allergies:  Allergies  Allergen Reactions  . Unasyn [Ampicillin-Sulbactam Sodium] Hives, Itching, Swelling and Rash   Medications Prior to Admission  Medication Sig Dispense Refill  . calcium citrate-vitamin D (CITRACAL+D) 315-200 MG-UNIT per tablet Take 1  tablet by mouth daily.     . DULoxetine (CYMBALTA) 60 MG capsule TAKE 1 CAPSULE(60 MG) BY MOUTH DAILY 90 capsule 0  . meloxicam (MOBIC) 15 MG tablet Take 1 tablet (15 mg total) by mouth daily. 90 tablet 4  . vitamin B-12 (CYANOCOBALAMIN) 1000 MCG tablet Take 1,000 mcg by mouth daily.      Home: Home Living Family/patient expects to be discharged to:: Private residence Living Arrangements: Spouse/significant other Available Help at Discharge: Family, Available 24 hours/day Type of Home: House Home Access: Level entry Home Layout: One level Bathroom Shower/Tub: Health visitorWalk-in shower Bathroom Toilet: Handicapped height Home Equipment: Environmental consultantWalker - 2 wheels, Bedside commode, Wheelchair - manual, Shower seat, Grab bars - tub/shower, Hand held shower head  Functional History: Prior Function Level of Independence: Needs assistance Gait / Transfers Assistance Needed: amb with rolling walker. working with [employer] ADL's / Homemaking Assistance Needed: reports independent Functional Status:  Mobility: Bed Mobility Overal bed mobility: Needs Assistance Bed Mobility: Rolling, Sidelying to Sit, Sit to Supine Rolling: Max assist, +2 for physical assistance Sidelying to sit: Max assist, +2 for physical assistance(+3) Sit to supine: Total assist, +2 for physical assistance(+3) General bed mobility comments: max-total A x2-3 for all aspects with use of bed pads when able. cueing for use of log roll technique Transfers Overall transfer level: Needs assistance Equipment used: Rolling walker (2 wheeled) Transfers: Sit to/from Stand Sit to Stand: From elevated surface, +2 physical assistance, Max assist General transfer comment: pt attempted x2 to achieve full erect standing position from  EOB; however, pt with minimal clearance of buttocks from bed even with max A x2-3 people; pt very limited secondary to pain and weakness Ambulation/Gait General Gait Details: unable    ADL: ADL Overall ADL's : Needs  assistance/impaired Eating/Feeding: Set up, Supervision/ safety, Sitting, Bed level Grooming: Set up, Sitting Upper Body Bathing: Moderate assistance, Sitting Upper Body Bathing Details (indicate cue type and reason): assist for back Lower Body Bathing: Maximal assistance Upper Body Dressing : Moderate assistance, Sitting Upper Body Dressing Details (indicate cue type and reason): to don second hospital gown Lower Body Dressing: Total assistance Toilet Transfer: Total assistance, +2 for physical assistance, +2 for safety/equipment, Requires wide/bariatric Toilet Transfer Details (indicate cue type and reason): Pt with foley, will require use of bed pan or lift equipment Functional mobility during ADLs: Total assistance, +2 for physical assistance, +2 for safety/equipment(Pt with excellent effort and motivated - did not stand) General ADL Comments: significantly impaired by pain and decreased ROM for ADL  Cognition: Cognition Overall Cognitive Status: Within Functional Limits for tasks assessed Orientation Level: Oriented X4 Cognition Arousal/Alertness: Awake/alert Behavior During Therapy: WFL for tasks assessed/performed Overall Cognitive Status: Within Functional Limits for tasks assessed  Blood pressure 130/81, pulse 76, temperature 97.7 F (36.5 C), temperature source Oral, resp. rate 15, height 5\' 10"  (1.778 m), weight (!) 147.8 kg, SpO2 97 %. Physical Exam  Constitutional: He is oriented to person, place, and time.  Morbidly obese  HENT:  Head: Normocephalic.  Eyes: Pupils are equal, round, and reactive to light.  Neck: Normal range of motion.  Cardiovascular: Normal rate.  Respiratory: Effort normal.  GI: Soft.  Musculoskeletal: He exhibits edema (LE 1+).  Left shoulder limited in AROM due RTC injury  Neurological: He is alert and oriented to person, place, and time.  RUE 4-5/5. LUE 2 to 5/5 prox to distal. RLE 2/5, LLE 3/5. Decreased LT in bilateral legs  Skin: Skin is  warm.  Back incision is dressed  Psychiatric: He has a normal mood and affect. His behavior is normal.    Results for orders placed or performed during the hospital encounter of 06/27/18 (from the past 24 hour(s))  Protein / creatinine ratio, urine     Status: Abnormal   Collection Time: 07/02/18  2:05 PM  Result Value Ref Range   Creatinine, Urine 62.26 mg/dL   Total Protein, Urine 71 mg/dL   Protein Creatinine Ratio 1.14 (H) 0.00 - 0.15 mg/mg[Cre]  Urinalysis, Complete w Microscopic     Status: Abnormal   Collection Time: 07/02/18  2:06 PM  Result Value Ref Range   Color, Urine YELLOW YELLOW   APPearance CLEAR CLEAR   Specific Gravity, Urine 1.015 1.005 - 1.030   pH 6.0 5.0 - 8.0   Glucose, UA NEGATIVE NEGATIVE mg/dL   Hgb urine dipstick MODERATE (A) NEGATIVE   Bilirubin Urine NEGATIVE NEGATIVE   Ketones, ur NEGATIVE NEGATIVE mg/dL   Protein, ur 30 (A) NEGATIVE mg/dL   Nitrite NEGATIVE NEGATIVE   Leukocytes, UA TRACE (A) NEGATIVE   RBC / HPF 0-5 0 - 5 RBC/hpf   WBC, UA 0-5 0 - 5 WBC/hpf   Bacteria, UA RARE (A) NONE SEEN   Mucus PRESENT   CBC with Differential/Platelet     Status: Abnormal   Collection Time: 07/03/18  4:25 AM  Result Value Ref Range   WBC 14.9 (H) 4.0 - 10.5 K/uL   RBC 4.02 (L) 4.22 - 5.81 MIL/uL   Hemoglobin 11.1 (L) 13.0 - 17.0 g/dL  HCT 34.7 (L) 39.0 - 52.0 %   MCV 86.3 78.0 - 100.0 fL   MCH 27.6 26.0 - 34.0 pg   MCHC 32.0 30.0 - 36.0 g/dL   RDW 40.9 (H) 81.1 - 91.4 %   Platelets 115 (L) 150 - 400 K/uL   Neutrophils Relative % 88 %   Lymphocytes Relative 7 %   Monocytes Relative 4 %   Eosinophils Relative 1 %   Basophils Relative 0 %   Neutro Abs 13.2 (H) 1.7 - 7.7 K/uL   Lymphs Abs 1.0 0.7 - 4.0 K/uL   Monocytes Absolute 0.6 0.1 - 1.0 K/uL   Eosinophils Absolute 0.1 0.0 - 0.7 K/uL   Basophils Absolute 0.0 0.0 - 0.1 K/uL   WBC Morphology TOXIC GRANULATION   Comprehensive metabolic panel     Status: Abnormal   Collection Time: 07/03/18  4:25  AM  Result Value Ref Range   Sodium 141 135 - 145 mmol/L   Potassium 3.7 3.5 - 5.1 mmol/L   Chloride 111 98 - 111 mmol/L   CO2 23 22 - 32 mmol/L   Glucose, Bld 111 (H) 70 - 99 mg/dL   BUN 43 (H) 6 - 20 mg/dL   Creatinine, Ser 7.82 0.61 - 1.24 mg/dL   Calcium 8.0 (L) 8.9 - 10.3 mg/dL   Total Protein 5.9 (L) 6.5 - 8.1 g/dL   Albumin 2.0 (L) 3.5 - 5.0 g/dL   AST 32 15 - 41 U/L   ALT 11 0 - 44 U/L   Alkaline Phosphatase 114 38 - 126 U/L   Total Bilirubin 0.9 0.3 - 1.2 mg/dL   GFR calc non Af Amer >60 >60 mL/min   GFR calc Af Amer >60 >60 mL/min   Anion gap 7 5 - 15  Protein / creatinine ratio, urine     Status: Abnormal   Collection Time: 07/03/18  7:13 AM  Result Value Ref Range   Creatinine, Urine 47.60 mg/dL   Total Protein, Urine 77 mg/dL   Protein Creatinine Ratio 1.62 (H) 0.00 - 0.15 mg/mg[Cre]   No results found.   Assessment/Plan: Diagnosis: Recent falls, Thoracic cord contusion with myelopathy, lumbar spondylosis with chronic radiculopathy, septic arthritis left knee 1. Does the need for close, 24 hr/day medical supervision in concert with the patient's rehab needs make it unreasonable for this patient to be served in a less intensive setting? Yes 2. Co-Morbidities requiring supervision/potential complications: morbid obesity, pain, left RTC syndrome,  3. Due to bladder management, bowel management, safety, skin/wound care, disease management, medication administration, pain management and patient education, does the patient require 24 hr/day rehab nursing? Yes 4. Does the patient require coordinated care of a physician, rehab nurse, PT (1-2 hrs/day, 5 days/week) and OT (1-2 hrs/day, 5 days/week) to address physical and functional deficits in the context of the above medical diagnosis(es)? Yes Addressing deficits in the following areas: balance, endurance, locomotion, strength, transferring, bowel/bladder control, bathing, dressing, feeding, grooming and psychosocial  support 5. Can the patient actively participate in an intensive therapy program of at least 3 hrs of therapy per day at least 5 days per week? Yes 6. The potential for patient to make measurable gains while on inpatient rehab is excellent 7. Anticipated functional outcomes upon discharge from inpatient rehab are supervision to min assist and mod assist  with PT, supervision, min assist and mod assist with OT, n/a with SLP. 8. Estimated rehab length of stay to reach the above functional goals is: 20-27 days 9.  Anticipated D/C setting: Home 10. Anticipated post D/C treatments: HH therapy 11. Overall Rehab/Functional Prognosis: excellent  RECOMMENDATIONS: This patient's condition is appropriate for continued rehabilitative care in the following setting: CIR Patient has agreed to participate in recommended program. Yes Note that insurance prior authorization may be required for reimbursement for recommended care.  Comment: Rehab Admissions Coordinator to follow up.  Thanks,  Ranelle Oyster, MD, [place] [facility]  I have personally performed a face to face diagnostic evaluation of this patient. Additionally, I have reviewed and concur with the physician assistant's documentation above.    Mcarthur Rossetti Angiulli, PA-C 07/03/2018

## 2018-07-04 LAB — AEROBIC/ANAEROBIC CULTURE W GRAM STAIN (SURGICAL/DEEP WOUND)

## 2018-07-04 LAB — CBC
HCT: 31 % — ABNORMAL LOW (ref 39.0–52.0)
Hemoglobin: 10.1 g/dL — ABNORMAL LOW (ref 13.0–17.0)
MCH: 27.9 pg (ref 26.0–34.0)
MCHC: 32.6 g/dL (ref 30.0–36.0)
MCV: 85.6 fL (ref 78.0–100.0)
Platelets: 150 10*3/uL (ref 150–400)
RBC: 3.62 MIL/uL — ABNORMAL LOW (ref 4.22–5.81)
RDW: 15.8 % — AB (ref 11.5–15.5)
WBC: 23.8 10*3/uL — ABNORMAL HIGH (ref 4.0–10.5)

## 2018-07-04 LAB — BASIC METABOLIC PANEL
Anion gap: 7 (ref 5–15)
BUN: 20 mg/dL (ref 6–20)
CALCIUM: 8 mg/dL — AB (ref 8.9–10.3)
CO2: 25 mmol/L (ref 22–32)
CREATININE: 0.87 mg/dL (ref 0.61–1.24)
Chloride: 110 mmol/L (ref 98–111)
GFR calc Af Amer: >60 mL/min (ref >60)
GFR calc non Af Amer: >60 mL/min (ref >60)
Glucose, Bld: 120 mg/dL — ABNORMAL HIGH (ref 70–99)
Potassium: 4.1 mmol/L (ref 3.5–5.1)
Sodium: 142 mmol/L (ref 135–145)

## 2018-07-04 LAB — AEROBIC/ANAEROBIC CULTURE (SURGICAL/DEEP WOUND)

## 2018-07-04 LAB — C-REACTIVE PROTEIN: CRP: 19 mg/dL — AB (ref <1.0)

## 2018-07-04 MED ORDER — HYDROCODONE-ACETAMINOPHEN 7.5-325 MG PO TABS
1.0000 | ORAL_TABLET | Freq: Four times a day (QID) | ORAL | Status: DC | PRN
  Administered 2018-07-04 – 2018-07-09 (×10): 1 via ORAL
  Filled 2018-07-04 (×10): qty 1

## 2018-07-04 MED ORDER — SORBITOL 70 % SOLN: 20.0000 mL | Freq: Every day | Status: DC | PRN

## 2018-07-04 MED ORDER — BISACODYL 10 MG RE SUPP: 10.0000 mg | Freq: Every day | RECTAL | Status: DC | PRN

## 2018-07-04 NOTE — Progress Notes (Signed)
Subjective: 3 Days Post-Op Procedure(s) (LRB): TRANSESOPHAGEAL ECHOCARDIOGRAM (TEE) (N/A) Patient reports pain as moderate.  Has been having significant constipation and difficulty voiding, most of which is likely due to limited mobility and narcotics use.  The VAC continues to have significant output.  Has mobilized some.  Objective: Vital signs in last 24 hours: Temp:  [97.7 F (36.5 C)-98.2 F (36.8 C)] 98.2 F (36.8 C) (09/21 0530) Pulse Rate:  [85-105] 105 (09/21 0530) Resp:  [16] 16 (09/20 1406) BP: (139-189)/(82-95) 163/85 (09/21 0530) SpO2:  [98 %-100 %] 98 % (09/21 0530)  Intake/Output from previous day: 09/20 0701 - 09/21 0700 In: 240 [P.O.:240] Out: 2976 [Urine:2876; Drains:100] Intake/Output this shift: Total I/O In: -  Out: 90 [Drains:90]  Recent Labs    07/02/18 0318 07/03/18 0425 07/04/18 0843  HGB 11.4* 11.1* 10.1*   Recent Labs    07/03/18 0425 07/04/18 0843  WBC 14.9* 23.8*  RBC 4.02* 3.62*  HCT 34.7* 31.0*  PLT 115* 150   Recent Labs    07/03/18 0425 07/04/18 0843  NA 141 142  K 3.7 4.1  CL 111 110  CO2 23 25  BUN 43* 20  CREATININE 1.23 0.87  GLUCOSE 111* 120*  CALCIUM 8.0* 8.0*   No results for input(s): LABPT, INR in the last 72 hours.  Is able to move his lower extremities, but weak    Assessment/Plan: 3 Days Post-Op Procedure(s) (LRB): TRANSESOPHAGEAL ECHOCARDIOGRAM (TEE) (N/A) Continue attempts to mobilize. Will have foley cath placed since has had to in-and-out 3 times. Work on bowel meds.    Kathryne HitchChristopher Y Dwyne [name] 07/04/2018, 9:38 AM

## 2018-07-04 NOTE — Plan of Care (Signed)
  Problem: Education: Goal: Knowledge of General Education information will improve Description: Including pain rating scale, medication(s)/side effects and non-pharmacologic comfort measures Outcome: Progressing   Problem: Activity: Goal: Risk for activity intolerance will decrease Outcome: Progressing   Problem: Elimination: Goal: Will not experience complications related to bowel motility Outcome: Progressing   Problem: Safety: Goal: Ability to remain free from injury will improve Outcome: Progressing   Problem: Skin Integrity: Goal: Risk for impaired skin integrity will decrease Outcome: Progressing   

## 2018-07-04 NOTE — Progress Notes (Signed)
Foley catheter inserted per order. 950cc of clear yellow urine noted. Patient verbalized relief. Patient has poor po intake, eats less than 25% of meals. States that he doesn't have any appetite. Had a small watery/bowel smear on bed pad. Passing gas. Bowel sounds active. Will cont to monitor.

## 2018-07-04 NOTE — Progress Notes (Signed)
Consultation PROGRESS NOTE  [name] [name] NGE:[id number] DOB: Oct 24, 1955 DOA: 06/27/2018 PCP: Renford Dills, MD  HPI/Recap of past 24 hours:  Cr normalized,   He could not urinate after foley removal, foley reinserted today  Has mineral oil enema yesterday, has a very small bm today  Wbc worsening, bp stable, no fever,  C/o left knee more painful than before, left wrist pain is improving, back pain is improving   Wife at bedside    Assessment/Plan: Principal Problem:   Spondylosis, thoracic, with myelopathy Active Problems:   Thoracic myelopathy   Abscess in epidural space of L2-L5 lumbar spine   Acute urinary retention   Left cervical radiculopathy   Leukocytosis   Postoperative seroma involving nervous system after nervous system procedure   Shoulder pain, bilateral   Septic arthritis of knee, left (HCC)   Complete tear of left rotator cuff   AKI (acute kidney injury) (HCC)   MSSA bacteremia  Sepsis secondary to MSSA bacteremia and lumbar epidural abscess (presenting symptom). -s/pLUMBAR WOUND DEBRIDEMENT DRAINAGE AND IRRIGATION on 9/17 -TEE no endocarditis Wound vac to back last changed on 9/20 - WBC improving.  Afebrile.  Blood culture pansensitive.  Continue Ancef.  Antibiotic duration per ID.  -plan per ID and primary team  Septic arthritis, left knee S/p ARTHROSCOPY KNEE UTILIZING PORTALS FOR IRRIGATION AND DRAINAGE OF LEFT KNEE PYARTHROSIS. Left knee fluids culture+ MSSA pan sensitivy Plan per ID and primary team  Left shoulder pain -mri left shoulder "Full-thickness retracted supraspinatus tendon tear measuring 28 x 22 mm. " -plan per primary team  Left wrist swelling Has some tenderness but no obvious joint tenderness.  X-ray shows soft tissue swelling only.  If symptoms persist and may need synovial aspiration of the joint by primary. -left wrist less swelling, nontender, not warm to touch on 9/20 exam  cervical and thoracic myelopathy.  Seen by  neurosurgery Dr Jordan Likes who recommends some form of decompressive surgery once acute issues have resolved.   AKI on CKDII Renal US no obstruction He did has urinary retention on admission with required foley placement, foley still in place  He was on NSAIDS at home Nephrology consulted, renal function normalized with hydration Close monitor urine output, continue gentle hydration for another 24hrs  Urinary retention Foley reinserted on 9/21 after failed voiding trial continue flomax] Mobilize   Constipation: continue senokot bid, increase to  miralax bid,  enemax1 on 9/20  Morbid obesity: Body mass index is 46.75 kg/m.   Code Status: full  Family Communication: patient and wife  Disposition Plan: per primary team   Consultants:  [facility]  Infectious disease   nephrology  Procedures:  As above  Antibiotics:  As above   Objective: BP 110/67 (BP Location: Right Arm)   Pulse 99   Temp 97.9 F (36.6 C) (Oral)   Resp 16   Ht 5\' 10"  (1.778 m)   Wt (!) 147.8 kg   SpO2 99%   BMI 46.75 kg/m   Intake/Output Summary (Last 24 hours) at 07/04/2018 1441 Last data filed at 07/04/2018 1353 Gross per 24 hour  Intake 240 ml  Output 4081 ml  Net -3841 ml   Filed Weights   06/28/18 1424  Weight: (!) 147.8 kg    Exam: Patient is examined daily including today on 07/04/2018, exams remain the same as of yesterday except that has changed    General:  NAD, obese, wound vac to back  Cardiovascular: RRR  Respiratory: CTABL  Abdomen: Soft/ND/NT, positive  BS  Musculoskeletal:   left knee slightly more swollen?   Neuro: alert, oriented   Data Reviewed: Basic Metabolic Panel: Recent Labs  Lab 06/30/18 0452 07/01/18 0501 07/02/18 0318 07/03/18 0425 07/04/18 0843  NA 141 137 140 141 142  K 3.5 3.8 4.5 3.7 4.1  CL 108 104 112* 111 110  CO2 22 21* 19* 23 25  GLUCOSE 158* 138* 96 111* 120*  BUN 58* 79* 81* 43* 20  CREATININE 2.47* 2.99* 2.43* 1.23  0.87  CALCIUM 8.4* 8.1* 7.9* 8.0* 8.0*   Liver Function Tests: Recent Labs  Lab 06/28/18 0002 06/29/18 0310 07/02/18 0318 07/02/18 0701 07/03/18 0425  AST 47* 59* QUANTITY NOT SUFFICIENT, UNABLE TO PERFORM TEST 55* 32  ALT 21 28 QUANTITY NOT SUFFICIENT, UNABLE TO PERFORM TEST 16 11  ALKPHOS 93 99 102  --  114  BILITOT 1.7* 1.6* QUANTITY NOT SUFFICIENT, UNABLE TO PERFORM TEST 0.7 0.9  PROT 6.9 6.7 5.6*  --  5.9*  ALBUMIN 3.0* 2.6* 1.9*  --  2.0*   No results for input(s): LIPASE, AMYLASE in the last 168 hours. No results for input(s): AMMONIA in the last 168 hours. CBC: Recent Labs  Lab 06/28/18 0002 06/29/18 0310 06/30/18 0452 07/02/18 0318 07/03/18 0425 07/04/18 0843  WBC 13.5* 12.6* 13.7* 11.4* 14.9* 23.8*  NEUTROABS 12.5*  --   --   --  13.2*  --   HGB 14.2 13.5 12.5* 11.4* 11.1* 10.1*  HCT 44.5 42.6 38.9* 35.1* 34.7* 31.0*  MCV 89.4 88.4 87.4 86.5 86.3 85.6  PLT 137* 144* 124* 105* 115* 150   Cardiac Enzymes:   Recent Labs  Lab 07/01/18 1651  CKTOTAL 89   BNP (last 3 results) No results for input(s): BNP in the last 8760 hours.  ProBNP (last 3 results) No results for input(s): PROBNP in the last 8760 hours.  CBG: No results for input(s): GLUCAP in the last 168 hours.  Recent Results (from the past 240 hour(s))  Culture, Urine     Status: Abnormal   Collection Time: 06/28/18 11:59 AM  Result Value Ref Range Status   Specimen Description URINE, CATHETERIZED  Final   Special Requests   Final    Normal Performed at [facility], [address] [place], [place] [address]    Culture MULTIPLE SPECIES PRESENT, SUGGEST RECOLLECTION (A)  Final   Report Status 06/29/2018 FINAL  Final  Culture, blood (routine x 2)     Status: Abnormal   Collection Time: 06/28/18  3:30 PM  Result Value Ref Range Status   Specimen Description BLOOD LEFT ANTECUBITAL  Final   Special Requests   Final    BOTTLES DRAWN AEROBIC AND ANAEROBIC Blood Culture adequate volume    Culture  Setup Time   Final    GRAM POSITIVE COCCI IN BOTH AEROBIC AND ANAEROBIC BOTTLES CRITICAL RESULT CALLED TO, READ BACK BY AND VERIFIED WITH: G. ABBOTT PHARMD, AT 0724 06/29/18 BY D. VANHOOK    Culture (A)  Final    STAPHYLOCOCCUS AUREUS SUSCEPTIBILITIES PERFORMED ON PREVIOUS CULTURE WITHIN THE LAST 5 DAYS. Performed at [facility] Lab, [address], [place], [place] [address]    Report Status 07/01/2018 FINAL  Final  Culture, blood (routine x 2)     Status: Abnormal   Collection Time: 06/28/18  3:35 PM  Result Value Ref Range Status   Specimen Description BLOOD LEFT HAND  Final   Special Requests   Final    BOTTLES DRAWN  AEROBIC AND ANAEROBIC Blood Culture adequate volume   Culture  Setup Time   Final    GRAM POSITIVE COCCI IN BOTH AEROBIC AND ANAEROBIC BOTTLES CRITICAL RESULT CALLED TO, READ BACK BY AND VERIFIED WITH: G. ABBOTT PHARMD, AT 0724 06/29/18 BY D. VANHOOK    Culture (A)  Final    STAPHYLOCOCCUS AUREUS BACILLUS SPECIES Standardized susceptibility testing for this organism is not available. CRITICAL RESULT CALLED TO, READ BACK BY AND VERIFIED WITH: Patrick North PHARMD, AT 1519 06/30/18 BY D. VANHOOK REGARDING CULTURE GROWTH Performed at [facility] Lab, [address] [place], [place] [address]    Report Status 07/01/2018 FINAL  Final   Organism ID, Bacteria STAPHYLOCOCCUS AUREUS  Final      Susceptibility   Staphylococcus aureus - MIC*    CIPROFLOXACIN <=0.5 SENSITIVE Sensitive     ERYTHROMYCIN <=0.25 SENSITIVE Sensitive     GENTAMICIN <=0.5 SENSITIVE Sensitive     OXACILLIN 0.5 SENSITIVE Sensitive     TETRACYCLINE <=1 SENSITIVE Sensitive     VANCOMYCIN <=0.5 SENSITIVE Sensitive     TRIMETH/SULFA <=10 SENSITIVE Sensitive     CLINDAMYCIN <=0.25 SENSITIVE Sensitive     RIFAMPIN <=0.5 SENSITIVE Sensitive     Inducible Clindamycin NEGATIVE Sensitive     * STAPHYLOCOCCUS AUREUS  Blood Culture ID Panel (Reflexed)     Status: Abnormal   Collection Time:  06/28/18  3:35 PM  Result Value Ref Range Status   Enterococcus species NOT DETECTED NOT DETECTED Final   Listeria monocytogenes NOT DETECTED NOT DETECTED Final   Staphylococcus species DETECTED (A) NOT DETECTED Final    Comment: CRITICAL RESULT CALLED TO, READ BACK BY AND VERIFIED WITH: G. ABBOTT PHARMD, AT 8119 06/29/18 BY D. VANHOOK    Staphylococcus aureus DETECTED (A) NOT DETECTED Final    Comment: Methicillin (oxacillin) susceptible Staphylococcus aureus (MSSA). Preferred therapy is anti staphylococcal beta lactam antibiotic (Cefazolin or Nafcillin), unless clinically contraindicated. CRITICAL RESULT CALLED TO, READ BACK BY AND VERIFIED WITH: G. ABBOTT PHARMD, AT 0724 06/29/18 BY D. VANHOOK    Methicillin resistance NOT DETECTED NOT DETECTED Final   Streptococcus species NOT DETECTED NOT DETECTED Final   Streptococcus agalactiae NOT DETECTED NOT DETECTED Final   Streptococcus pneumoniae NOT DETECTED NOT DETECTED Final   Streptococcus pyogenes NOT DETECTED NOT DETECTED Final   Acinetobacter baumannii NOT DETECTED NOT DETECTED Final   Enterobacteriaceae species NOT DETECTED NOT DETECTED Final   Enterobacter cloacae complex NOT DETECTED NOT DETECTED Final   Escherichia coli NOT DETECTED NOT DETECTED Final   Klebsiella oxytoca NOT DETECTED NOT DETECTED Final   Klebsiella pneumoniae NOT DETECTED NOT DETECTED Final   Proteus species NOT DETECTED NOT DETECTED Final   Serratia marcescens NOT DETECTED NOT DETECTED Final   Haemophilus influenzae NOT DETECTED NOT DETECTED Final   Neisseria meningitidis NOT DETECTED NOT DETECTED Final   Pseudomonas aeruginosa NOT DETECTED NOT DETECTED Final   Candida albicans NOT DETECTED NOT DETECTED Final   Candida glabrata NOT DETECTED NOT DETECTED Final   Candida krusei NOT DETECTED NOT DETECTED Final   Candida parapsilosis NOT DETECTED NOT DETECTED Final   Candida tropicalis NOT DETECTED NOT DETECTED Final    Comment: Performed at [facility]  Lab, [address], [place], [place] [address]  Aerobic/Anaerobic Culture (surgical/deep wound)     Status: None (Preliminary result)   Collection Time: 06/28/18  5:01 PM  Result Value Ref Range Status   Specimen Description ABSCESS LUMBAR  Final   Special Requests NONE  Final   Gram Stain   Final    ABUNDANT WBC PRESENT, PREDOMINANTLY PMN ABUNDANT GRAM POSITIVE COCCI IN PAIRS IN CLUSTERS Performed at [facility] Lab, [address] [place], [place] [address]    Culture   Final    MODERATE STAPHYLOCOCCUS AUREUS NO ANAEROBES ISOLATED; CULTURE IN PROGRESS FOR 5 DAYS    Report Status PENDING  Incomplete   Organism ID, Bacteria STAPHYLOCOCCUS AUREUS  Final      Susceptibility   Staphylococcus aureus - MIC*    CIPROFLOXACIN <=0.5 SENSITIVE Sensitive     ERYTHROMYCIN <=0.25 SENSITIVE Sensitive     GENTAMICIN <=0.5 SENSITIVE Sensitive     OXACILLIN <=0.25 SENSITIVE Sensitive     TETRACYCLINE <=1 SENSITIVE Sensitive     VANCOMYCIN <=0.5 SENSITIVE Sensitive     TRIMETH/SULFA <=10 SENSITIVE Sensitive     CLINDAMYCIN <=0.25 SENSITIVE Sensitive     RIFAMPIN <=0.5 SENSITIVE Sensitive     Inducible Clindamycin NEGATIVE Sensitive     * MODERATE STAPHYLOCOCCUS AUREUS  MRSA PCR Screening     Status: None   Collection Time: 06/29/18  4:14 PM  Result Value Ref Range Status   MRSA by PCR NEGATIVE NEGATIVE Final    Comment:        The GeneXpert MRSA Assay (FDA approved for NASAL specimens only), is one component of a comprehensive MRSA colonization surveillance program. It is not intended to diagnose MRSA infection nor to guide or monitor treatment for MRSA infections. Performed at [facility] Lab, [address], [place], [place] [address]   Aerobic/Anaerobic Culture (surgical/deep wound)     Status: None (Preliminary result)   Collection Time: 06/29/18  6:08 PM  Result Value Ref Range Status   Specimen Description FLUID LEFT KNEE  Final   Special Requests NONE  Final   Gram Stain    Final    ABUNDANT WBC PRESENT, PREDOMINANTLY PMN RARE GRAM POSITIVE COCCI RESULT CALLED TO, READ BACK BY AND VERIFIED WITH: DR Otelia Sergeant 06/29/18 2012 Performed at [facility] Lab, [address], [place], [place] [address]    Culture   Final    RARE STAPHYLOCOCCUS AUREUS NO ANAEROBES ISOLATED; CULTURE IN PROGRESS FOR 5 DAYS    Report Status PENDING  Incomplete   Organism ID, Bacteria STAPHYLOCOCCUS AUREUS  Final      Susceptibility   Staphylococcus aureus - MIC*    CIPROFLOXACIN <=0.5 SENSITIVE Sensitive     ERYTHROMYCIN <=0.25 SENSITIVE Sensitive     GENTAMICIN <=0.5 SENSITIVE Sensitive     OXACILLIN 0.5 SENSITIVE Sensitive     TETRACYCLINE <=1 SENSITIVE Sensitive     VANCOMYCIN <=0.5 SENSITIVE Sensitive     TRIMETH/SULFA <=10 SENSITIVE Sensitive     CLINDAMYCIN <=0.25 SENSITIVE Sensitive     RIFAMPIN <=0.5 SENSITIVE Sensitive     Inducible Clindamycin NEGATIVE Sensitive     * RARE STAPHYLOCOCCUS AUREUS  Aerobic/Anaerobic Culture (surgical/deep wound)     Status: None (Preliminary result)   Collection Time: 06/29/18  7:31 PM  Result Value Ref Range Status   Specimen Description ABSCESS  Final   Special Requests LUMBAR ABSCESS  Final   Gram Stain   Final    ABUNDANT WBC PRESENT,BOTH PMN AND MONONUCLEAR ABUNDANT GRAM POSITIVE COCCI Performed at [facility], [address] [place], [place] [address]    Culture   Final    ABUNDANT STAPHYLOCOCCUS AUREUS NO ANAEROBES ISOLATED; CULTURE IN PROGRESS FOR 5 DAYS    Report Status PENDING  Incomplete   Organism ID, Bacteria STAPHYLOCOCCUS AUREUS  Final      Susceptibility   Staphylococcus aureus - MIC*    CIPROFLOXACIN <=0.5 SENSITIVE Sensitive     ERYTHROMYCIN <=0.25 SENSITIVE Sensitive     GENTAMICIN <=0.5 SENSITIVE Sensitive     OXACILLIN 0.5 SENSITIVE Sensitive     TETRACYCLINE <=1 SENSITIVE Sensitive     VANCOMYCIN 1 SENSITIVE Sensitive     TRIMETH/SULFA <=10 SENSITIVE Sensitive     CLINDAMYCIN <=0.25 SENSITIVE  Sensitive     RIFAMPIN <=0.5 SENSITIVE Sensitive     Inducible Clindamycin NEGATIVE Sensitive     * ABUNDANT STAPHYLOCOCCUS AUREUS  Culture, blood (routine x 2)     Status: None (Preliminary result)   Collection Time: 06/30/18  4:52 AM  Result Value Ref Range Status   Specimen Description BLOOD LEFT ARM  Final   Special Requests   Final    BOTTLES DRAWN AEROBIC AND ANAEROBIC Blood Culture adequate volume   Culture   Final    NO GROWTH 4 DAYS Performed at [facility], [address] [place], [place] [address]    Report Status PENDING  Incomplete  Culture, blood (routine x 2)     Status: None (Preliminary result)   Collection Time: 06/30/18  4:55 AM  Result Value Ref Range Status   Specimen Description BLOOD LEFT WRIST  Final   Special Requests   Final    BOTTLES DRAWN AEROBIC AND ANAEROBIC Blood Culture adequate volume   Culture   Final    NO GROWTH 4 DAYS Performed at [facility], [address] [place], [place] [address]    Report Status PENDING  Incomplete     Studies: No results found.  Scheduled Meds: . acetaminophen  1,000 mg Oral Q6H  . calcium-vitamin D  1 tablet Oral Daily  . DULoxetine  60 mg Oral Daily  . enoxaparin (LOVENOX) injection  30 mg Subcutaneous Q24H  . pantoprazole  40 mg Oral BID AC  . polyethylene glycol  17 g Oral BID  . senna-docusate  1 tablet Oral BID  . sodium chloride flush  3 mL Intravenous Q12H  . tamsulosin  0.4 mg Oral Daily  . vitamin B-12  1,000 mcg Oral Daily    Continuous Infusions: . sodium chloride 75 mL/hr at 07/04/18 0612  . sodium chloride    .  ceFAZolin (ANCEF) IV 2 g (07/04/18 1332)  . methocarbamol (ROBAXIN) IV       Time spent: 25 mins, case discussed with ID I have personally reviewed and interpreted on  07/04/2018 daily labs, imagings as discussed above under date review session and assessment and plans.  I reviewed all nursing notes, pharmacy notes, consultant notes,  vitals, pertinent old  records  I have discussed plan of care as described above with RN , patient and family on 07/04/2018   Albertine GratesFang Vasil Juhasz MD, PhD  [facility] Pager [phone]. If 7PM-7AM, please contact night-coverage at [email or web address], password [email or web address] 07/04/2018, 2:41 PM  LOS: 6 days

## 2018-07-04 NOTE — Plan of Care (Signed)
  Problem: Health Behavior/Discharge Planning: Goal: Ability to manage health-related needs will improve Outcome: Progressing   Problem: Coping: Goal: Level of anxiety will decrease Outcome: Progressing   Problem: Safety: Goal: Ability to remain free from injury will improve Outcome: Progressing   Problem: Skin Integrity: Goal: Risk for impaired skin integrity will decrease Outcome: Progressing   

## 2018-07-05 LAB — BASIC METABOLIC PANEL
ANION GAP: 7 (ref 5–15)
BUN: 15 mg/dL (ref 6–20)
CALCIUM: 8 mg/dL — AB (ref 8.9–10.3)
CHLORIDE: 110 mmol/L (ref 98–111)
CO2: 27 mmol/L (ref 22–32)
Creatinine, Ser: 0.82 mg/dL (ref 0.61–1.24)
GFR calc Af Amer: >60 mL/min (ref >60)
GFR calc non Af Amer: >60 mL/min (ref >60)
GLUCOSE: 126 mg/dL — AB (ref 70–99)
Potassium: 3.9 mmol/L (ref 3.5–5.1)
Sodium: 144 mmol/L (ref 135–145)

## 2018-07-05 LAB — CBC WITH DIFFERENTIAL/PLATELET
BASOS ABS: 0 10*3/uL (ref 0.0–0.1)
Basophils Relative: 0 %
Eosinophils Absolute: 0.3 10*3/uL (ref 0.0–0.7)
Eosinophils Relative: 1 %
HCT: 31.8 % — ABNORMAL LOW (ref 39.0–52.0)
HEMOGLOBIN: 10.2 g/dL — AB (ref 13.0–17.0)
LYMPHS ABS: 2.5 10*3/uL (ref 0.7–4.0)
Lymphocytes Relative: 10 %
MCH: 27.9 pg (ref 26.0–34.0)
MCHC: 32.1 g/dL (ref 30.0–36.0)
MCV: 87.1 fL (ref 78.0–100.0)
MONO ABS: 0.7 10*3/uL (ref 0.1–1.0)
MONOS PCT: 3 %
Neutro Abs: 21.4 10*3/uL — ABNORMAL HIGH (ref 1.7–7.7)
Neutrophils Relative %: 86 %
Platelets: 195 10*3/uL (ref 150–400)
RBC: 3.65 MIL/uL — AB (ref 4.22–5.81)
RDW: 15.9 % — AB (ref 11.5–15.5)
WBC: 24.9 10*3/uL — AB (ref 4.0–10.5)

## 2018-07-05 LAB — CULTURE, BLOOD (ROUTINE X 2)
CULTURE: NO GROWTH
Culture: NO GROWTH
SPECIAL REQUESTS: ADEQUATE
Special Requests: ADEQUATE

## 2018-07-05 LAB — HEPATITIS PANEL, ACUTE
Hep A IgM: NEGATIVE
Hep B C IgM: NEGATIVE
Hepatitis B Surface Ag: NEGATIVE

## 2018-07-05 MED ORDER — SORBITOL 70 % SOLN
960.0000 mL | TOPICAL_OIL | Freq: Once | ORAL | Status: AC
  Administered 2018-07-05: 960 mL via RECTAL
  Filled 2018-07-05: qty 473

## 2018-07-05 NOTE — Plan of Care (Signed)
?  Problem: Nutrition: ?Goal: Adequate nutrition will be maintained ?Outcome: Progressing ?  ?Problem: Coping: ?Goal: Level of anxiety will decrease ?Outcome: Progressing ?  ?Problem: Elimination: ?Goal: Will not experience complications related to bowel motility ?Outcome: Progressing ?  ?Problem: Safety: ?Goal: Ability to remain free from injury will improve ?Outcome: Progressing ?  ?Problem: Skin Integrity: ?Goal: Risk for impaired skin integrity will decrease ?Outcome: Progressing ?  ?

## 2018-07-05 NOTE — Progress Notes (Signed)
Consultation PROGRESS NOTE  [name] [name] ZOX:[id number] DOB: 01-02-1956 DOA: 06/27/2018 PCP: Renford Dills, MD  HPI/Recap of past 24 hours:  Cr normalized, Urine output 2.7liter,    Report appetite has improved a litter today, but Still no bm, denies ab pain,   Wbc worsening, bp stable, no fever   Report left knee pain and back pain    Assessment/Plan: Principal Problem:   Spondylosis, thoracic, with myelopathy Active Problems:   Thoracic myelopathy   Abscess in epidural space of L2-L5 lumbar spine   Acute urinary retention   Left cervical radiculopathy   Leukocytosis   Postoperative seroma involving nervous system after nervous system procedure   Shoulder pain, bilateral   Septic arthritis of knee, left (HCC)   Complete tear of left rotator cuff   AKI (acute kidney injury) (HCC)   MSSA bacteremia  Sepsis secondary to MSSA bacteremia and lumbar epidural abscess (presenting symptom). -s/pLUMBAR WOUND DEBRIDEMENT DRAINAGE AND IRRIGATION on 9/17 -TEE no endocarditis, repeat Blood culture no growth -Wound vac to back last changed on 9/20 - .on ancef,  Afebrile.  but leukocytosis worsening -  plan per ID and primary team  Septic arthritis, left knee S/p ARTHROSCOPY KNEE UTILIZING PORTALS FOR IRRIGATION AND DRAINAGE OF LEFT KNEE PYARTHROSIS. Left knee fluids culture+ MSSA pan sensitivy C/o to c/o left knee pain Plan per ID and primary team  Left shoulder pain -mri left shoulder "Full-thickness retracted supraspinatus tendon tear measuring 28 x 22 mm. " -plan per primary team  Left wrist swelling Has some tenderness but no obvious joint tenderness.  X-ray shows soft tissue swelling only.  If symptoms persist and may need synovial aspiration of the joint by primary. -left wrist less swelling, nontender, not warm to touch on 9/20 exam -plan per ortho  cervical and thoracic myelopathy.  Seen by neurosurgery Dr Jordan Likes who recommends some form of decompressive surgery  once acute issues have resolved.   AKI on CKDII Renal US no obstruction He did has urinary retention on admission with required foley placement, foley still in place  He was on NSAIDS at home Nephrology consulted, renal function normalized with hydration D/c ivf , encourage oral intake, repeat bmp in am  Urinary retention Foley reinserted on 9/21 after failed voiding trial continue flomax Mobilize   Constipation: still no bm, smog enema x1 today   Morbid obesity: Body mass index is 46.75 kg/m.   Code Status: full  Family Communication: patient and wife  Disposition Plan: per primary team   Consultants:  [facility]  Infectious disease   nephrology  Procedures:  As above  Antibiotics:  As above   Objective: BP (!) 146/104 (BP Location: Right Arm)   Pulse 92   Temp 98.8 F (37.1 C) (Oral)   Resp 16   Ht 5\' 10"  (1.778 m)   Wt (!) 147.8 kg   SpO2 100%   BMI 46.75 kg/m   Intake/Output Summary (Last 24 hours) at 07/05/2018 1522 Last data filed at 07/05/2018 0500 Gross per 24 hour  Intake 480 ml  Output 1800 ml  Net -1320 ml   Filed Weights   06/28/18 1424  Weight: (!) 147.8 kg    Exam: Patient is examined daily including today on 07/05/2018, exams remain the same as of yesterday except that has changed    General:  NAD, obese, wound vac to back  Cardiovascular: RRR  Respiratory: CTABL  Abdomen: Soft/ND/NT, positive BS  Musculoskeletal:   left knee slightly more swollen?  Neuro: alert, oriented   Data Reviewed: Basic Metabolic Panel: Recent Labs  Lab 07/01/18 0501 07/02/18 0318 07/03/18 0425 07/04/18 0843 07/05/18 0409  NA 137 140 141 142 144  K 3.8 4.5 3.7 4.1 3.9  CL 104 112* 111 110 110  CO2 21* 19* 23 25 27   GLUCOSE 138* 96 111* 120* 126*  BUN 79* 81* 43* 20 15  CREATININE 2.99* 2.43* 1.23 0.87 0.82  CALCIUM 8.1* 7.9* 8.0* 8.0* 8.0*   Liver Function Tests: Recent Labs  Lab 06/29/18 0310 07/02/18 0318  07/02/18 0701 07/03/18 0425  AST 59* QUANTITY NOT SUFFICIENT, UNABLE TO PERFORM TEST 55* 32  ALT 28 QUANTITY NOT SUFFICIENT, UNABLE TO PERFORM TEST 16 11  ALKPHOS 99 102  --  114  BILITOT 1.6* QUANTITY NOT SUFFICIENT, UNABLE TO PERFORM TEST 0.7 0.9  PROT 6.7 5.6*  --  5.9*  ALBUMIN 2.6* 1.9*  --  2.0*   No results for input(s): LIPASE, AMYLASE in the last 168 hours. No results for input(s): AMMONIA in the last 168 hours. CBC: Recent Labs  Lab 06/30/18 0452 07/02/18 0318 07/03/18 0425 07/04/18 0843 07/05/18 0409  WBC 13.7* 11.4* 14.9* 23.8* 24.9*  NEUTROABS  --   --  13.2*  --  21.4*  HGB 12.5* 11.4* 11.1* 10.1* 10.2*  HCT 38.9* 35.1* 34.7* 31.0* 31.8*  MCV 87.4 86.5 86.3 85.6 87.1  PLT 124* 105* 115* 150 195   Cardiac Enzymes:   Recent Labs  Lab 07/01/18 1651  CKTOTAL 89   BNP (last 3 results) No results for input(s): BNP in the last 8760 hours.  ProBNP (last 3 results) No results for input(s): PROBNP in the last 8760 hours.  CBG: No results for input(s): GLUCAP in the last 168 hours.  Recent Results (from the past 240 hour(s))  Culture, Urine     Status: Abnormal   Collection Time: 06/28/18 11:59 AM  Result Value Ref Range Status   Specimen Description URINE, CATHETERIZED  Final   Special Requests   Final    Normal Performed at [facility], [address], [place], [place] [address]    Culture MULTIPLE SPECIES PRESENT, SUGGEST RECOLLECTION (A)  Final   Report Status 06/29/2018 FINAL  Final  Culture, blood (routine x 2)     Status: Abnormal   Collection Time: 06/28/18  3:30 PM  Result Value Ref Range Status   Specimen Description BLOOD LEFT ANTECUBITAL  Final   Special Requests   Final    BOTTLES DRAWN AEROBIC AND ANAEROBIC Blood Culture adequate volume   Culture  Setup Time   Final    GRAM POSITIVE COCCI IN BOTH AEROBIC AND ANAEROBIC BOTTLES CRITICAL RESULT CALLED TO, READ BACK BY AND VERIFIED WITH: G. ABBOTT PHARMD, AT 0724 06/29/18 BY D.  VANHOOK    Culture (A)  Final    STAPHYLOCOCCUS AUREUS SUSCEPTIBILITIES PERFORMED ON PREVIOUS CULTURE WITHIN THE LAST 5 DAYS. Performed at [facility], [address] [place], [place] [address]    Report Status 07/01/2018 FINAL  Final  Culture, blood (routine x 2)     Status: Abnormal   Collection Time: 06/28/18  3:35 PM  Result Value Ref Range Status   Specimen Description BLOOD LEFT HAND  Final   Special Requests   Final    BOTTLES DRAWN AEROBIC AND ANAEROBIC Blood Culture adequate volume   Culture  Setup Time   Final    GRAM POSITIVE COCCI IN BOTH AEROBIC AND ANAEROBIC BOTTLES CRITICAL RESULT  CALLED TO, READ BACK BY AND VERIFIED WITH: G. ABBOTT PHARMD, AT 0724 06/29/18 BY D. VANHOOK    Culture (A)  Final    STAPHYLOCOCCUS AUREUS BACILLUS SPECIES Standardized susceptibility testing for this organism is not available. CRITICAL RESULT CALLED TO, READ BACK BY AND VERIFIED WITH: Patrick North PHARMD, AT 1519 06/30/18 BY D. VANHOOK REGARDING CULTURE GROWTH Performed at [facility] Lab, [address], [place], [place] [address]    Report Status 07/01/2018 FINAL  Final   Organism ID, Bacteria STAPHYLOCOCCUS AUREUS  Final      Susceptibility   Staphylococcus aureus - MIC*    CIPROFLOXACIN <=0.5 SENSITIVE Sensitive     ERYTHROMYCIN <=0.25 SENSITIVE Sensitive     GENTAMICIN <=0.5 SENSITIVE Sensitive     OXACILLIN 0.5 SENSITIVE Sensitive     TETRACYCLINE <=1 SENSITIVE Sensitive     VANCOMYCIN <=0.5 SENSITIVE Sensitive     TRIMETH/SULFA <=10 SENSITIVE Sensitive     CLINDAMYCIN <=0.25 SENSITIVE Sensitive     RIFAMPIN <=0.5 SENSITIVE Sensitive     Inducible Clindamycin NEGATIVE Sensitive     * STAPHYLOCOCCUS AUREUS  Blood Culture ID Panel (Reflexed)     Status: Abnormal   Collection Time: 06/28/18  3:35 PM  Result Value Ref Range Status   Enterococcus species NOT DETECTED NOT DETECTED Final   Listeria monocytogenes NOT DETECTED NOT DETECTED Final   Staphylococcus species  DETECTED (A) NOT DETECTED Final    Comment: CRITICAL RESULT CALLED TO, READ BACK BY AND VERIFIED WITH: G. ABBOTT PHARMD, AT 6045 06/29/18 BY D. VANHOOK    Staphylococcus aureus DETECTED (A) NOT DETECTED Final    Comment: Methicillin (oxacillin) susceptible Staphylococcus aureus (MSSA). Preferred therapy is anti staphylococcal beta lactam antibiotic (Cefazolin or Nafcillin), unless clinically contraindicated. CRITICAL RESULT CALLED TO, READ BACK BY AND VERIFIED WITH: G. ABBOTT PHARMD, AT 0724 06/29/18 BY D. VANHOOK    Methicillin resistance NOT DETECTED NOT DETECTED Final   Streptococcus species NOT DETECTED NOT DETECTED Final   Streptococcus agalactiae NOT DETECTED NOT DETECTED Final   Streptococcus pneumoniae NOT DETECTED NOT DETECTED Final   Streptococcus pyogenes NOT DETECTED NOT DETECTED Final   Acinetobacter baumannii NOT DETECTED NOT DETECTED Final   Enterobacteriaceae species NOT DETECTED NOT DETECTED Final   Enterobacter cloacae complex NOT DETECTED NOT DETECTED Final   Escherichia coli NOT DETECTED NOT DETECTED Final   Klebsiella oxytoca NOT DETECTED NOT DETECTED Final   Klebsiella pneumoniae NOT DETECTED NOT DETECTED Final   Proteus species NOT DETECTED NOT DETECTED Final   Serratia marcescens NOT DETECTED NOT DETECTED Final   Haemophilus influenzae NOT DETECTED NOT DETECTED Final   Neisseria meningitidis NOT DETECTED NOT DETECTED Final   Pseudomonas aeruginosa NOT DETECTED NOT DETECTED Final   Candida albicans NOT DETECTED NOT DETECTED Final   Candida glabrata NOT DETECTED NOT DETECTED Final   Candida krusei NOT DETECTED NOT DETECTED Final   Candida parapsilosis NOT DETECTED NOT DETECTED Final   Candida tropicalis NOT DETECTED NOT DETECTED Final    Comment: Performed at [facility], [address] [place], [place] [address]  Aerobic/Anaerobic Culture (surgical/deep wound)     Status: None   Collection Time: 06/28/18  5:01 PM  Result Value Ref Range Status   Specimen  Description ABSCESS LUMBAR  Final   Special Requests NONE  Final   Gram Stain   Final    ABUNDANT WBC PRESENT, PREDOMINANTLY PMN ABUNDANT GRAM POSITIVE COCCI IN PAIRS IN CLUSTERS    Culture   Final  MODERATE STAPHYLOCOCCUS AUREUS NO ANAEROBES ISOLATED Performed at [facility], [address], [place], [place] [address]    Report Status 07/04/2018 FINAL  Final   Organism ID, Bacteria STAPHYLOCOCCUS AUREUS  Final      Susceptibility   Staphylococcus aureus - MIC*    CIPROFLOXACIN <=0.5 SENSITIVE Sensitive     ERYTHROMYCIN <=0.25 SENSITIVE Sensitive     GENTAMICIN <=0.5 SENSITIVE Sensitive     OXACILLIN <=0.25 SENSITIVE Sensitive     TETRACYCLINE <=1 SENSITIVE Sensitive     VANCOMYCIN <=0.5 SENSITIVE Sensitive     TRIMETH/SULFA <=10 SENSITIVE Sensitive     CLINDAMYCIN <=0.25 SENSITIVE Sensitive     RIFAMPIN <=0.5 SENSITIVE Sensitive     Inducible Clindamycin NEGATIVE Sensitive     * MODERATE STAPHYLOCOCCUS AUREUS  MRSA PCR Screening     Status: None   Collection Time: 06/29/18  4:14 PM  Result Value Ref Range Status   MRSA by PCR NEGATIVE NEGATIVE Final    Comment:        The GeneXpert MRSA Assay (FDA approved for NASAL specimens only), is one component of a comprehensive MRSA colonization surveillance program. It is not intended to diagnose MRSA infection nor to guide or monitor treatment for MRSA infections. Performed at [facility], [address] [place], [place] [address]   Aerobic/Anaerobic Culture (surgical/deep wound)     Status: None   Collection Time: 06/29/18  6:08 PM  Result Value Ref Range Status   Specimen Description FLUID LEFT KNEE  Final   Special Requests NONE  Final   Gram Stain   Final    ABUNDANT WBC PRESENT, PREDOMINANTLY PMN RARE GRAM POSITIVE COCCI RESULT CALLED TO, READ BACK BY AND VERIFIED WITH: DR Otelia Sergeant 06/29/18 2012    Culture   Final    RARE STAPHYLOCOCCUS AUREUS NO ANAEROBES ISOLATED Performed at [facility]  Lab, [address] [place], [place] [address]    Report Status 07/04/2018 FINAL  Final   Organism ID, Bacteria STAPHYLOCOCCUS AUREUS  Final      Susceptibility   Staphylococcus aureus - MIC*    CIPROFLOXACIN <=0.5 SENSITIVE Sensitive     ERYTHROMYCIN <=0.25 SENSITIVE Sensitive     GENTAMICIN <=0.5 SENSITIVE Sensitive     OXACILLIN 0.5 SENSITIVE Sensitive     TETRACYCLINE <=1 SENSITIVE Sensitive     VANCOMYCIN <=0.5 SENSITIVE Sensitive     TRIMETH/SULFA <=10 SENSITIVE Sensitive     CLINDAMYCIN <=0.25 SENSITIVE Sensitive     RIFAMPIN <=0.5 SENSITIVE Sensitive     Inducible Clindamycin NEGATIVE Sensitive     * RARE STAPHYLOCOCCUS AUREUS  Aerobic/Anaerobic Culture (surgical/deep wound)     Status: None   Collection Time: 06/29/18  7:31 PM  Result Value Ref Range Status   Specimen Description ABSCESS  Final   Special Requests LUMBAR ABSCESS  Final   Gram Stain   Final    ABUNDANT WBC PRESENT,BOTH PMN AND MONONUCLEAR ABUNDANT GRAM POSITIVE COCCI    Culture   Final    ABUNDANT STAPHYLOCOCCUS AUREUS NO ANAEROBES ISOLATED Performed at [facility], [address], [place], [place] [address]    Report Status 07/04/2018 FINAL  Final   Organism ID, Bacteria STAPHYLOCOCCUS AUREUS  Final      Susceptibility   Staphylococcus aureus - MIC*    CIPROFLOXACIN <=0.5 SENSITIVE Sensitive     ERYTHROMYCIN <=0.25 SENSITIVE Sensitive     GENTAMICIN <=0.5 SENSITIVE Sensitive     OXACILLIN 0.5 SENSITIVE Sensitive  TETRACYCLINE <=1 SENSITIVE Sensitive     VANCOMYCIN 1 SENSITIVE Sensitive     TRIMETH/SULFA <=10 SENSITIVE Sensitive     CLINDAMYCIN <=0.25 SENSITIVE Sensitive     RIFAMPIN <=0.5 SENSITIVE Sensitive     Inducible Clindamycin NEGATIVE Sensitive     * ABUNDANT STAPHYLOCOCCUS AUREUS  Culture, blood (routine x 2)     Status: None   Collection Time: 06/30/18  4:52 AM  Result Value Ref Range Status   Specimen Description BLOOD LEFT ARM  Final   Special Requests   Final    BOTTLES  DRAWN AEROBIC AND ANAEROBIC Blood Culture adequate volume   Culture   Final    NO GROWTH 5 DAYS Performed at [facility], [address] [place], [place] [address]    Report Status 07/05/2018 FINAL  Final  Culture, blood (routine x 2)     Status: None   Collection Time: 06/30/18  4:55 AM  Result Value Ref Range Status   Specimen Description BLOOD LEFT WRIST  Final   Special Requests   Final    BOTTLES DRAWN AEROBIC AND ANAEROBIC Blood Culture adequate volume   Culture   Final    NO GROWTH 5 DAYS Performed at [facility], [address] [place], [place] [address]    Report Status 07/05/2018 FINAL  Final     Studies: No results found.  Scheduled Meds: . acetaminophen  1,000 mg Oral Q6H  . calcium-vitamin D  1 tablet Oral Daily  . DULoxetine  60 mg Oral Daily  . enoxaparin (LOVENOX) injection  30 mg Subcutaneous Q24H  . pantoprazole  40 mg Oral BID AC  . polyethylene glycol  17 g Oral BID  . senna-docusate  1 tablet Oral BID  . sodium chloride flush  3 mL Intravenous Q12H  . tamsulosin  0.4 mg Oral Daily  . vitamin B-12  1,000 mcg Oral Daily    Continuous Infusions: . sodium chloride 75 mL/hr at 07/05/18 1104  . sodium chloride    .  ceFAZolin (ANCEF) IV 2 g (07/05/18 1356)  . methocarbamol (ROBAXIN) IV       Time spent: 25 mins,  I have personally reviewed and interpreted on  07/05/2018 daily labs, imagings as discussed above under date review session and assessment and plans.  I reviewed all nursing notes, pharmacy notes, consultant notes,  vitals, pertinent old records  I have discussed plan of care as described above with RN , patient and family on 07/05/2018   Albertine GratesFang Meko Bellanger MD, PhD  [facility] Pager [phone]. If 7PM-7AM, please contact night-coverage at [email or web address], password [email or web address] 07/05/2018, 3:22 PM  LOS: 7 days

## 2018-07-06 DIAGNOSIS — M7989 Other specified soft tissue disorders: Secondary | ICD-10-CM

## 2018-07-06 DIAGNOSIS — G061 Intraspinal abscess and granuloma: Secondary | ICD-10-CM

## 2018-07-06 DIAGNOSIS — M25532 Pain in left wrist: Secondary | ICD-10-CM

## 2018-07-06 DIAGNOSIS — D72829 Elevated white blood cell count, unspecified: Secondary | ICD-10-CM

## 2018-07-06 DIAGNOSIS — M25432 Effusion, left wrist: Secondary | ICD-10-CM

## 2018-07-06 DIAGNOSIS — M25462 Effusion, left knee: Secondary | ICD-10-CM

## 2018-07-06 LAB — BASIC METABOLIC PANEL
ANION GAP: 6 (ref 5–15)
BUN: 11 mg/dL (ref 6–20)
CO2: 28 mmol/L (ref 22–32)
Calcium: 7.8 mg/dL — ABNORMAL LOW (ref 8.9–10.3)
Chloride: 108 mmol/L (ref 98–111)
Creatinine, Ser: 0.86 mg/dL (ref 0.61–1.24)
GFR calc non Af Amer: >60 mL/min (ref >60)
Glucose, Bld: 118 mg/dL — ABNORMAL HIGH (ref 70–99)
Potassium: 4.2 mmol/L (ref 3.5–5.1)
SODIUM: 142 mmol/L (ref 135–145)

## 2018-07-06 LAB — CBC WITH DIFFERENTIAL/PLATELET
Basophils Absolute: 0 10*3/uL (ref 0.0–0.1)
Basophils Relative: 0 %
EOS PCT: 0 %
Eosinophils Absolute: 0 10*3/uL (ref 0.0–0.7)
HEMATOCRIT: 28.9 % — AB (ref 39.0–52.0)
Hemoglobin: 9.2 g/dL — ABNORMAL LOW (ref 13.0–17.0)
Lymphocytes Relative: 7 %
Lymphs Abs: 1.9 10*3/uL (ref 0.7–4.0)
MCH: 27.6 pg (ref 26.0–34.0)
MCHC: 31.8 g/dL (ref 30.0–36.0)
MCV: 86.8 fL (ref 78.0–100.0)
MONO ABS: 1.3 10*3/uL — AB (ref 0.1–1.0)
Monocytes Relative: 5 %
NEUTROS PCT: 88 %
Neutro Abs: 23.6 10*3/uL — ABNORMAL HIGH (ref 1.7–7.7)
Platelets: 226 10*3/uL (ref 150–400)
RBC: 3.33 MIL/uL — AB (ref 4.22–5.81)
RDW: 15.9 % — AB (ref 11.5–15.5)
WBC: 26.8 10*3/uL — AB (ref 4.0–10.5)

## 2018-07-06 LAB — IMMUNOFIXATION, URINE

## 2018-07-06 LAB — RETICULOCYTES
RBC.: 3.2 MIL/uL — AB (ref 4.22–5.81)
RETIC COUNT ABSOLUTE: 35.2 10*3/uL (ref 19.0–186.0)
RETIC CT PCT: 1.1 % (ref 0.4–3.1)

## 2018-07-06 LAB — MAGNESIUM: MAGNESIUM: 1.9 mg/dL (ref 1.7–2.4)

## 2018-07-06 LAB — URIC ACID: Uric Acid, Serum: 4.4 mg/dL (ref 3.7–8.6)

## 2018-07-06 MED ORDER — DOCUSATE SODIUM 100 MG PO CAPS
100.0000 mg | ORAL_CAPSULE | Freq: Two times a day (BID) | ORAL | Status: DC
  Administered 2018-07-08 (×2): 100 mg via ORAL
  Filled 2018-07-06 (×6): qty 1

## 2018-07-06 MED ORDER — ENSURE ENLIVE PO LIQD
237.0000 mL | Freq: Three times a day (TID) | ORAL | Status: DC
  Administered 2018-07-06 – 2018-07-09 (×7): 237 mL via ORAL

## 2018-07-06 MED ORDER — BISACODYL 10 MG RE SUPP: 10.0000 mg | RECTAL | Status: DC

## 2018-07-06 MED ORDER — FERROUS GLUCONATE 324 (38 FE) MG PO TABS
324.0000 mg | ORAL_TABLET | Freq: Three times a day (TID) | ORAL | Status: DC
  Administered 2018-07-06 – 2018-07-09 (×10): 324 mg via ORAL
  Filled 2018-07-06 (×10): qty 1

## 2018-07-06 MED ORDER — ADULT MULTIVITAMIN W/MINERALS CH
1.0000 | ORAL_TABLET | Freq: Every day | ORAL | Status: DC
  Administered 2018-07-07 – 2018-07-09 (×3): 1 via ORAL
  Filled 2018-07-06 (×3): qty 1

## 2018-07-06 NOTE — Progress Notes (Addendum)
Physical Therapy Treatment Patient Details Name: [name] Junrnest Incorvaia MRN: [id number] DOB: [birth year] Today's Date: 07/06/2018    History of Present Illness [name] [name] is a 62 y.o. male admitted secondary to profound bilateral LE weakness. Pt with multiple diagnoses including thoracic myelopathy, flaccid paralysic both legs, left cervical radiculopathy, acute spinal cord injury; Pt is now s/p LUMBAR WOUND DEBRIDEMENT DRAINAGE AND IRRIGATION; AND ASPIRATION OF LEFT KNEE ARTHROSCOPY KNEE (Left).    PT Comments    Pt supine in bed on arrival and eager to mobilize.  Minimal complaints of pain during session.  Pt continues to present with LE strength and required max +3 for standing activities.  However he was able to achieve standing with flexed hips and trunk for 10 sec periods.  Post transfer training opted for lateral scoot from bed to recliner for OOB transfer.  Pt did not require use of lift equipment during this session.  Plan remains appropriate for rehab in a post acute setting for a lengthier recovery time to improve strength and function before returning home.    Follow Up Recommendations  Supervision/Assistance - 24 hour;CIR     Equipment Recommendations  None recommended by PT    Recommendations for Other Services       Precautions / Restrictions Precautions Precautions: Back;Fall Precaution Booklet Issued: No Precaution Comments: wound VAC; Restrictions Weight Bearing Restrictions: No    Mobility  Bed Mobility Overal bed mobility: Needs Assistance Bed Mobility: Rolling Rolling: Max assist;+2 for physical assistance Sidelying to sit: Max assist;+2 for physical assistance       General bed mobility comments: Max assistance +2 for rolling and sidelying to sit.  facilitation at hip and scapula to roll, poor ability to reach with LUE until on his side.  Pt performed sidelying to sit with assistance to advance B LEs and elevate trunk into sitting.  pt able to maintain sitting  edge of bed once B feet on floor.    Transfers Overall transfer level: Needs assistance Equipment used: Rolling walker (2 wheeled) Transfers: Sit to/from Stand;Lateral/Scoot Transfers Sit to Stand: Max assist;+2 physical assistance;+2 safety/equipment(+3 assist, +2 for boosting into standing and 3rd person to facilitate B knee extension due to weak quads.  )        Lateral/Scoot Transfers: Max assist;+2 physical assistance;+2 safety/equipment(+3 for safety to keep chair from moving.  ) General transfer comment: Pt able to clear bottom but unable to stand upright.  RLE significantly weaker than the L and required max assistance to extend B knees into standing.  Pt able to stand for 10 sec before needing to sit due to weakness.  Opted for lateral scoot to drop arm chair for transfer OOB.  +3 assistance to scoot from higher bed to lower chair with use of bed pad.  +3 to brace chair as it began to move despite being locked due to force of the patients weight.    Ambulation/Gait Ambulation/Gait assistance: (NT)               Stairs             Wheelchair Mobility    Modified Rankin (Stroke Patients Only)       Balance Overall balance assessment: Needs assistance Sitting-balance support: Feet supported;Single extremity supported;Bilateral upper extremity supported Sitting balance-Leahy Scale: Poor       Standing balance-Leahy Scale: Poor  Cognition Arousal/Alertness: Awake/alert Behavior During Therapy: WFL for tasks assessed/performed Overall Cognitive Status: Within Functional Limits for tasks assessed                                        Exercises      General Comments        Pertinent Vitals/Pain Pain Assessment: Faces Faces Pain Scale: Hurts a little bit Pain Location: back and L knee Pain Descriptors / Indicators: Grimacing;Sore;Guarding Pain Intervention(s): Monitored during  session;Repositioned    Home Living                      Prior Function            PT Goals (current goals can now be found in the care plan section) Acute Rehab PT Goals Patient Stated Goal: to walk in the parallel bars at OP PT Potential to Achieve Goals: Good Progress towards PT goals: Progressing toward goals    Frequency    Min 4X/week      PT Plan Current plan remains appropriate    Co-evaluation   Reason for Co-Treatment: Complexity of the patient's impairments (multi-system involvement);Necessary to address cognition/behavior during functional activity;For patient/therapist safety;To address functional/ADL transfers PT goals addressed during session: Mobility/safety with mobility OT goals addressed during session: ADL's and self-care      AM-PAC PT "6 Clicks" Daily Activity  Outcome Measure  Difficulty turning over in bed (including adjusting bedclothes, sheets and blankets)?: Unable Difficulty moving from lying on back to sitting on the side of the bed? : Unable Difficulty sitting down on and standing up from a chair with arms (e.g., wheelchair, bedside commode, etc,.)?: Unable Help needed moving to and from a bed to chair (including a wheelchair)?: A Lot Help needed walking in hospital room?: Total Help needed climbing 3-5 steps with a railing? : Total 6 Click Score: 7    End of Session Equipment Utilized During Treatment: Gait belt Activity Tolerance: Patient tolerated treatment well Patient left: in chair;with call bell/phone within reach;with family/visitor present Nurse Communication: Mobility status;Need for lift equipment PT Visit Diagnosis: Other abnormalities of gait and mobility (R26.89);Pain Pain - Right/Left: Left Pain - part of body: Knee(and back)     Time: 1040-1103 PT Time Calculation (min) (ACUTE ONLY): 23 min  Charges:  $Therapeutic Activity: 8-22 mins                     Joycelyn Rua, PTA Acute Rehabilitation  Services Pager [phone] Office [phone]     [name] [name] Artis Delay 07/06/2018, 11:27 AM

## 2018-07-06 NOTE — Progress Notes (Signed)
INFECTIOUS DISEASE PROGRESS NOTE  ID: [name] [name] is a 62 y.o. male with  Principal Problem:   Spondylosis, thoracic, with myelopathy Active Problems:   Thoracic myelopathy   Abscess in epidural space of L2-L5 lumbar spine   Acute urinary retention   Left cervical radiculopathy   Leukocytosis   Postoperative seroma involving nervous system after nervous system procedure   Shoulder pain, bilateral   Septic arthritis of knee, left (HCC)   Complete tear of left rotator cuff   AKI (acute kidney injury) (HCC)   MSSA bacteremia   Abscess in epidural space of lumbar spine   Effusion, left knee  Subjective: No complaints.   Abtx:  Anti-infectives (From admission, onward)   Start     Dose/Rate Route Frequency Ordered Stop   06/29/18 1936  vancomycin (VANCOCIN) powder  Status:  Discontinued       As needed 06/29/18 1936 06/29/18 2121   06/29/18 1928  polymyxin B 500,000 Units, bacitracin 50,000 Units in sodium chloride 0.9 % 500 mL irrigation  Status:  Discontinued       As needed 06/29/18 1928 06/29/18 2121   06/29/18 1800  vancomycin (VANCOCIN) IVPB 1000 mg/200 mL premix  Status:  Discontinued     1,000 mg 200 mL/hr over 60 Minutes Intravenous  Once 06/29/18 0032 06/29/18 1042   06/29/18 1400  ceFAZolin (ANCEF) IVPB 2g/100 mL premix     2 g 200 mL/hr over 30 Minutes Intravenous Every 8 hours 06/29/18 1042     06/29/18 0200  ceFEPIme (MAXIPIME) 2 g in sodium chloride 0.9 % 100 mL IVPB  Status:  Discontinued     2 g 200 mL/hr over 30 Minutes Intravenous Every 8 hours 06/29/18 0031 06/29/18 1042   06/29/18 0130  vancomycin (VANCOCIN) 2,000 mg in sodium chloride 0.9 % 500 mL IVPB     2,000 mg 250 mL/hr over 120 Minutes Intravenous  Once 06/29/18 0032 06/29/18 0257      Medications:  Scheduled: . acetaminophen  1,000 mg Oral Q6H  . bisacodyl  10 mg Rectal Q3 days  . calcium-vitamin D  1 tablet Oral Daily  . docusate sodium  100 mg Oral BID  . DULoxetine  60 mg Oral Daily    . enoxaparin (LOVENOX) injection  30 mg Subcutaneous Q24H  . feeding supplement (ENSURE ENLIVE)  237 mL Oral TID BM  . ferrous gluconate  324 mg Oral TID WC  . multivitamin with minerals  1 tablet Oral Daily  . pantoprazole  40 mg Oral BID AC  . senna-docusate  1 tablet Oral BID  . sodium chloride flush  3 mL Intravenous Q12H  . tamsulosin  0.4 mg Oral Daily  . vitamin B-12  1,000 mcg Oral Daily    Objective: Vital signs in last 24 hours: Temp:  [98.2 F (36.8 C)-100.6 F (38.1 C)] 98.2 F (36.8 C) (09/23 1419) Pulse Rate:  [82-90] 87 (09/23 1419) Resp:  [16-17] 16 (09/23 1419) BP: (129-136)/(64-74) 131/64 (09/23 1419) SpO2:  [99 %-100 %] 100 % (09/23 1419)   General appearance: alert, cooperative and no distress Resp: clear to auscultation bilaterally Cardio: regular rate and rhythm GI: normal findings: bowel sounds normal and soft, non-tender Extremities: wrist swelling on L, to mid-forearm. limited mobility.   Lab Results Recent Labs    07/05/18 0409 07/06/18 0540  WBC 24.9* 26.8*  HGB 10.2* 9.2*  HCT 31.8* 28.9*  NA 144 142  K 3.9 4.2  CL 110 108  CO2 27  28  BUN 15 11  CREATININE 0.82 0.86   Liver Panel No results for input(s): PROT, ALBUMIN, AST, ALT, ALKPHOS, BILITOT, BILIDIR, IBILI in the last 72 hours. Sedimentation Rate No results for input(s): ESRSEDRATE in the last 72 hours. C-Reactive Protein Recent Labs    07/04/18 0442  CRP 19.0*    Microbiology: Recent Results (from the past 240 hour(s))  Culture, Urine     Status: Abnormal   Collection Time: 06/28/18 11:59 AM  Result Value Ref Range Status   Specimen Description URINE, CATHETERIZED  Final   Special Requests   Final    Normal Performed at [facility], [address] [place], [place] [address]    Culture MULTIPLE SPECIES PRESENT, SUGGEST RECOLLECTION (A)  Final   Report Status 06/29/2018 FINAL  Final  Culture, blood (routine x 2)     Status: Abnormal   Collection Time:  06/28/18  3:30 PM  Result Value Ref Range Status   Specimen Description BLOOD LEFT ANTECUBITAL  Final   Special Requests   Final    BOTTLES DRAWN AEROBIC AND ANAEROBIC Blood Culture adequate volume   Culture  Setup Time   Final    GRAM POSITIVE COCCI IN BOTH AEROBIC AND ANAEROBIC BOTTLES CRITICAL RESULT CALLED TO, READ BACK BY AND VERIFIED WITH: G. ABBOTT PHARMD, AT 0724 06/29/18 BY D. VANHOOK    Culture (A)  Final    STAPHYLOCOCCUS AUREUS SUSCEPTIBILITIES PERFORMED ON PREVIOUS CULTURE WITHIN THE LAST 5 DAYS. Performed at [facility], [address], [place], [place] [address]    Report Status 07/01/2018 FINAL  Final  Culture, blood (routine x 2)     Status: Abnormal   Collection Time: 06/28/18  3:35 PM  Result Value Ref Range Status   Specimen Description BLOOD LEFT HAND  Final   Special Requests   Final    BOTTLES DRAWN AEROBIC AND ANAEROBIC Blood Culture adequate volume   Culture  Setup Time   Final    GRAM POSITIVE COCCI IN BOTH AEROBIC AND ANAEROBIC BOTTLES CRITICAL RESULT CALLED TO, READ BACK BY AND VERIFIED WITH: G. ABBOTT PHARMD, AT 0724 06/29/18 BY D. VANHOOK    Culture (A)  Final    STAPHYLOCOCCUS AUREUS BACILLUS SPECIES Standardized susceptibility testing for this organism is not available. CRITICAL RESULT CALLED TO, READ BACK BY AND VERIFIED WITH: Patrick North PHARMD, AT 1519 06/30/18 BY D. VANHOOK REGARDING CULTURE GROWTH Performed at [facility], [address] [place], [place] [address]    Report Status 07/01/2018 FINAL  Final   Organism ID, Bacteria STAPHYLOCOCCUS AUREUS  Final      Susceptibility   Staphylococcus aureus - MIC*    CIPROFLOXACIN <=0.5 SENSITIVE Sensitive     ERYTHROMYCIN <=0.25 SENSITIVE Sensitive     GENTAMICIN <=0.5 SENSITIVE Sensitive     OXACILLIN 0.5 SENSITIVE Sensitive     TETRACYCLINE <=1 SENSITIVE Sensitive     VANCOMYCIN <=0.5 SENSITIVE Sensitive     TRIMETH/SULFA <=10 SENSITIVE Sensitive     CLINDAMYCIN <=0.25  SENSITIVE Sensitive     RIFAMPIN <=0.5 SENSITIVE Sensitive     Inducible Clindamycin NEGATIVE Sensitive     * STAPHYLOCOCCUS AUREUS  Blood Culture ID Panel (Reflexed)     Status: Abnormal   Collection Time: 06/28/18  3:35 PM  Result Value Ref Range Status   Enterococcus species NOT DETECTED NOT DETECTED Final   Listeria monocytogenes NOT DETECTED NOT DETECTED Final   Staphylococcus species DETECTED (A) NOT DETECTED Final  Comment: CRITICAL RESULT CALLED TO, READ BACK BY AND VERIFIED WITH: G. ABBOTT PHARMD, AT 0724 06/29/18 BY D. VANHOOK    Staphylococcus aureus DETECTED (A) NOT DETECTED Final    Comment: Methicillin (oxacillin) susceptible Staphylococcus aureus (MSSA). Preferred therapy is anti staphylococcal beta lactam antibiotic (Cefazolin or Nafcillin), unless clinically contraindicated. CRITICAL RESULT CALLED TO, READ BACK BY AND VERIFIED WITH: G. ABBOTT PHARMD, AT 0724 06/29/18 BY D. VANHOOK    Methicillin resistance NOT DETECTED NOT DETECTED Final   Streptococcus species NOT DETECTED NOT DETECTED Final   Streptococcus agalactiae NOT DETECTED NOT DETECTED Final   Streptococcus pneumoniae NOT DETECTED NOT DETECTED Final   Streptococcus pyogenes NOT DETECTED NOT DETECTED Final   Acinetobacter baumannii NOT DETECTED NOT DETECTED Final   Enterobacteriaceae species NOT DETECTED NOT DETECTED Final   Enterobacter cloacae complex NOT DETECTED NOT DETECTED Final   Escherichia coli NOT DETECTED NOT DETECTED Final   Klebsiella oxytoca NOT DETECTED NOT DETECTED Final   Klebsiella pneumoniae NOT DETECTED NOT DETECTED Final   Proteus species NOT DETECTED NOT DETECTED Final   Serratia marcescens NOT DETECTED NOT DETECTED Final   Haemophilus influenzae NOT DETECTED NOT DETECTED Final   Neisseria meningitidis NOT DETECTED NOT DETECTED Final   Pseudomonas aeruginosa NOT DETECTED NOT DETECTED Final   Candida albicans NOT DETECTED NOT DETECTED Final   Candida glabrata NOT DETECTED NOT DETECTED  Final   Candida krusei NOT DETECTED NOT DETECTED Final   Candida parapsilosis NOT DETECTED NOT DETECTED Final   Candida tropicalis NOT DETECTED NOT DETECTED Final    Comment: Performed at [facility], [address] [place], [place] [address]  Aerobic/Anaerobic Culture (surgical/deep wound)     Status: None   Collection Time: 06/28/18  5:01 PM  Result Value Ref Range Status   Specimen Description ABSCESS LUMBAR  Final   Special Requests NONE  Final   Gram Stain   Final    ABUNDANT WBC PRESENT, PREDOMINANTLY PMN ABUNDANT GRAM POSITIVE COCCI IN PAIRS IN CLUSTERS    Culture   Final    MODERATE STAPHYLOCOCCUS AUREUS NO ANAEROBES ISOLATED Performed at [facility], [address], [place], [place] [address]    Report Status 07/04/2018 FINAL  Final   Organism ID, Bacteria STAPHYLOCOCCUS AUREUS  Final      Susceptibility   Staphylococcus aureus - MIC*    CIPROFLOXACIN <=0.5 SENSITIVE Sensitive     ERYTHROMYCIN <=0.25 SENSITIVE Sensitive     GENTAMICIN <=0.5 SENSITIVE Sensitive     OXACILLIN <=0.25 SENSITIVE Sensitive     TETRACYCLINE <=1 SENSITIVE Sensitive     VANCOMYCIN <=0.5 SENSITIVE Sensitive     TRIMETH/SULFA <=10 SENSITIVE Sensitive     CLINDAMYCIN <=0.25 SENSITIVE Sensitive     RIFAMPIN <=0.5 SENSITIVE Sensitive     Inducible Clindamycin NEGATIVE Sensitive     * MODERATE STAPHYLOCOCCUS AUREUS  MRSA PCR Screening     Status: None   Collection Time: 06/29/18  4:14 PM  Result Value Ref Range Status   MRSA by PCR NEGATIVE NEGATIVE Final    Comment:        The GeneXpert MRSA Assay (FDA approved for NASAL specimens only), is one component of a comprehensive MRSA colonization surveillance program. It is not intended to diagnose MRSA infection nor to guide or monitor treatment for MRSA infections. Performed at [facility], [address] [place], [place] [address]   Aerobic/Anaerobic Culture (surgical/deep wound)     Status: None   Collection Time:  06/29/18  6:08 PM  Result Value Ref Range Status   Specimen Description FLUID LEFT KNEE  Final   Special Requests NONE  Final   Gram Stain   Final    ABUNDANT WBC PRESENT, PREDOMINANTLY PMN RARE GRAM POSITIVE COCCI RESULT CALLED TO, READ BACK BY AND VERIFIED WITH: DR Otelia Sergeant 06/29/18 2012    Culture   Final    RARE STAPHYLOCOCCUS AUREUS NO ANAEROBES ISOLATED Performed at [facility], [address] [place], [place] [address]    Report Status 07/04/2018 FINAL  Final   Organism ID, Bacteria STAPHYLOCOCCUS AUREUS  Final      Susceptibility   Staphylococcus aureus - MIC*    CIPROFLOXACIN <=0.5 SENSITIVE Sensitive     ERYTHROMYCIN <=0.25 SENSITIVE Sensitive     GENTAMICIN <=0.5 SENSITIVE Sensitive     OXACILLIN 0.5 SENSITIVE Sensitive     TETRACYCLINE <=1 SENSITIVE Sensitive     VANCOMYCIN <=0.5 SENSITIVE Sensitive     TRIMETH/SULFA <=10 SENSITIVE Sensitive     CLINDAMYCIN <=0.25 SENSITIVE Sensitive     RIFAMPIN <=0.5 SENSITIVE Sensitive     Inducible Clindamycin NEGATIVE Sensitive     * RARE STAPHYLOCOCCUS AUREUS  Aerobic/Anaerobic Culture (surgical/deep wound)     Status: None   Collection Time: 06/29/18  7:31 PM  Result Value Ref Range Status   Specimen Description ABSCESS  Final   Special Requests LUMBAR ABSCESS  Final   Gram Stain   Final    ABUNDANT WBC PRESENT,BOTH PMN AND MONONUCLEAR ABUNDANT GRAM POSITIVE COCCI    Culture   Final    ABUNDANT STAPHYLOCOCCUS AUREUS NO ANAEROBES ISOLATED Performed at [facility], [address], [place], [place] [address]    Report Status 07/04/2018 FINAL  Final   Organism ID, Bacteria STAPHYLOCOCCUS AUREUS  Final      Susceptibility   Staphylococcus aureus - MIC*    CIPROFLOXACIN <=0.5 SENSITIVE Sensitive     ERYTHROMYCIN <=0.25 SENSITIVE Sensitive     GENTAMICIN <=0.5 SENSITIVE Sensitive     OXACILLIN 0.5 SENSITIVE Sensitive     TETRACYCLINE <=1 SENSITIVE Sensitive     VANCOMYCIN 1 SENSITIVE Sensitive      TRIMETH/SULFA <=10 SENSITIVE Sensitive     CLINDAMYCIN <=0.25 SENSITIVE Sensitive     RIFAMPIN <=0.5 SENSITIVE Sensitive     Inducible Clindamycin NEGATIVE Sensitive     * ABUNDANT STAPHYLOCOCCUS AUREUS  Culture, blood (routine x 2)     Status: None   Collection Time: 06/30/18  4:52 AM  Result Value Ref Range Status   Specimen Description BLOOD LEFT ARM  Final   Special Requests   Final    BOTTLES DRAWN AEROBIC AND ANAEROBIC Blood Culture adequate volume   Culture   Final    NO GROWTH 5 DAYS Performed at [facility], [address], [place], [place] [address]    Report Status 07/05/2018 FINAL  Final  Culture, blood (routine x 2)     Status: None   Collection Time: 06/30/18  4:55 AM  Result Value Ref Range Status   Specimen Description BLOOD LEFT WRIST  Final   Special Requests   Final    BOTTLES DRAWN AEROBIC AND ANAEROBIC Blood Culture adequate volume   Culture   Final    NO GROWTH 5 DAYS Performed at [facility], [address], [place], [place] [address]    Report Status 07/05/2018 FINAL  Final    Studies/Results: No results found.   Assessment/Plan: Lumbar  abscess (debrided 9-16) MSSA bacteremia AKI (resolving, Cr 1.2) L supraspinatus cuff tear L wrist pain and swelling Leukocytosis  Total days of antibiotics: 8 ancef  Agree with L wrist MRI, consider adding forearm. Suspect he seeded when he was bacteremic.  He may need debridement, appreciate ortho f/u.  Continue ancef No problem with PIC, would consider placing on R side though         Johny Sax MD, FACP Infectious Diseases (pager) [phone] [email or web address] 07/06/2018, 4:31 PM  LOS: 8 days

## 2018-07-06 NOTE — Progress Notes (Addendum)
Consultation PROGRESS NOTE  [name] [name] ZOX:[id number] DOB: 01-10-1956 DOA: 06/27/2018 PCP: Renford Dills, MD  HPI/Recap of past 24 hours:  Cr normalized, Urine output 1.3liter,  Appetite is improving  States the enema worked "too good", had watery bm several time after the enema  C/o left wrist pain, left knee pain, back pain is improving, wound vac in place  Wbc worsening, bp stable, tmax 100.6 last 24hrs  Wife at bedside    Assessment/Plan: Principal Problem:   Spondylosis, thoracic, with myelopathy Active Problems:   Thoracic myelopathy   Abscess in epidural space of L2-L5 lumbar spine   Acute urinary retention   Left cervical radiculopathy   Leukocytosis   Postoperative seroma involving nervous system after nervous system procedure   Shoulder pain, bilateral   Septic arthritis of knee, left (HCC)   Complete tear of left rotator cuff   AKI (acute kidney injury) (HCC)   MSSA bacteremia  Sepsis secondary to MSSA bacteremia and lumbar epidural abscess (presenting symptom). -s/pLUMBAR WOUND DEBRIDEMENT DRAINAGE AND IRRIGATION on 9/17 -TEE no endocarditis, repeat Blood culture no growth -Wound vac to back last changed on 9/20 - .on ancef,  Afebrile.  but leukocytosis worsening, ortho is getting mri of the wrist -  case discussed with ID Dr Ninetta Lights who is ok to proceed with picc line placement once repeat blood culture is negative, Continue Ancef IV for 6 weeks until 10/28 , and follow up with ID NP Marcos Eke or MD Dr Ninetta Lights   Septic arthritis, left knee S/p ARTHROSCOPY KNEE UTILIZING PORTALS FOR IRRIGATION AND DRAINAGE OF LEFT KNEE PYARTHROSIS. Left knee fluids culture+ MSSA pan sensitivy C/o to c/o left knee pain Plan per ID and primary team  Left shoulder pain -mri left shoulder "Full-thickness retracted supraspinatus tendon tear measuring 28 x 22 mm. " -plan per primary team  Left wrist swelling Has some tenderness but no obvious joint tenderness.  X-ray  shows soft tissue swelling only.  If symptoms persist and may need synovial aspiration of the joint by primary. -left wrist more swollen, warm to touch and mild tender today on 9/23 -plan per ortho, mri left  Wrist ordered by ortho  cervical and thoracic myelopathy.  Seen by neurosurgery Dr Jordan Likes who recommends some form of decompressive surgery once acute issues have resolved.   AKI on CKDII Renal US no obstruction He did has urinary retention on admission with required foley placement, foley still in place  He was on NSAIDS at home Nephrology consulted, renal function normalized with hydration D/c ivf , encourage oral intake, repeat bmp in am  Urinary retention/neurogenic bladder? Foley reinserted on 9/21 after failed voiding trial continue flomax Mobilize   Constipation: resolved after smog enema, back down on stool softener   Morbid obesity: Body mass index is 46.75 kg/m.   Code Status: full  Family Communication: patient and wife  Disposition Plan: per primary team, cir vs snf, need to place picc line for iv abx prior to discharge   Consultants:  [facility]  Infectious disease   nephrology  Procedures:  As above  Antibiotics:  As above   Objective: BP 136/74 (BP Location: Right Arm)   Pulse 82   Temp 99.1 F (37.3 C) (Oral)   Resp 17   Ht 5\' 10"  (1.778 m)   Wt (!) 147.8 kg   SpO2 100%   BMI 46.75 kg/m   Intake/Output Summary (Last 24 hours) at 07/06/2018 0947 Last data filed at 07/06/2018 0431 Gross per 24  hour  Intake 240 ml  Output 1400 ml  Net -1160 ml   Filed Weights   06/28/18 1424  Weight: (!) 147.8 kg    Exam: Patient is examined daily including today on 07/06/2018, exams remain the same as of yesterday except that has changed    General:  NAD, obese, wound vac to back  Cardiovascular: RRR  Respiratory: CTABL  Abdomen: Soft/ND/NT, positive BS  Musculoskeletal:  Left dorsal hand/wrist  Warm to touch, more swollen  today, left knee slightly more swollen?   Neuro: alert, oriented   Data Reviewed: Basic Metabolic Panel: Recent Labs  Lab 07/02/18 0318 07/03/18 0425 07/04/18 0843 07/05/18 0409 07/06/18 0540  NA 140 141 142 144 142  K 4.5 3.7 4.1 3.9 4.2  CL 112* 111 110 110 108  CO2 19* 23 25 27 28   GLUCOSE 96 111* 120* 126* 118*  BUN 81* 43* 20 15 11   CREATININE 2.43* 1.23 0.87 0.82 0.86  CALCIUM 7.9* 8.0* 8.0* 8.0* 7.8*  MG  --   --   --   --  1.9   Liver Function Tests: Recent Labs  Lab 07/02/18 0318 07/02/18 0701 07/03/18 0425  AST QUANTITY NOT SUFFICIENT, UNABLE TO PERFORM TEST 55* 32  ALT QUANTITY NOT SUFFICIENT, UNABLE TO PERFORM TEST 16 11  ALKPHOS 102  --  114  BILITOT QUANTITY NOT SUFFICIENT, UNABLE TO PERFORM TEST 0.7 0.9  PROT 5.6*  --  5.9*  ALBUMIN 1.9*  --  2.0*   No results for input(s): LIPASE, AMYLASE in the last 168 hours. No results for input(s): AMMONIA in the last 168 hours. CBC: Recent Labs  Lab 07/02/18 0318 07/03/18 0425 07/04/18 0843 07/05/18 0409 07/06/18 0540  WBC 11.4* 14.9* 23.8* 24.9* 26.8*  NEUTROABS  --  13.2*  --  21.4* 23.6*  HGB 11.4* 11.1* 10.1* 10.2* 9.2*  HCT 35.1* 34.7* 31.0* 31.8* 28.9*  MCV 86.5 86.3 85.6 87.1 86.8  PLT 105* 115* 150 195 226   Cardiac Enzymes:   Recent Labs  Lab 07/01/18 1651  CKTOTAL 89   BNP (last 3 results) No results for input(s): BNP in the last 8760 hours.  ProBNP (last 3 results) No results for input(s): PROBNP in the last 8760 hours.  CBG: No results for input(s): GLUCAP in the last 168 hours.  Recent Results (from the past 240 hour(s))  Culture, Urine     Status: Abnormal   Collection Time: 06/28/18 11:59 AM  Result Value Ref Range Status   Specimen Description URINE, CATHETERIZED  Final   Special Requests   Final    Normal Performed at [facility], [address] [place], [place] [address]    Culture MULTIPLE SPECIES PRESENT, SUGGEST RECOLLECTION (A)  Final   Report Status  06/29/2018 FINAL  Final  Culture, blood (routine x 2)     Status: Abnormal   Collection Time: 06/28/18  3:30 PM  Result Value Ref Range Status   Specimen Description BLOOD LEFT ANTECUBITAL  Final   Special Requests   Final    BOTTLES DRAWN AEROBIC AND ANAEROBIC Blood Culture adequate volume   Culture  Setup Time   Final    GRAM POSITIVE COCCI IN BOTH AEROBIC AND ANAEROBIC BOTTLES CRITICAL RESULT CALLED TO, READ BACK BY AND VERIFIED WITH: G. ABBOTT PHARMD, AT 0724 06/29/18 BY D. VANHOOK    Culture (A)  Final    STAPHYLOCOCCUS AUREUS SUSCEPTIBILITIES PERFORMED ON PREVIOUS CULTURE WITHIN THE LAST 5 DAYS. Performed at [facility], [address] [place], [place] [address]    Report Status 07/01/2018 FINAL  Final  Culture, blood (routine x 2)     Status: Abnormal   Collection Time: 06/28/18  3:35 PM  Result Value Ref Range Status   Specimen Description BLOOD LEFT HAND  Final   Special Requests   Final    BOTTLES DRAWN AEROBIC AND ANAEROBIC Blood Culture adequate volume   Culture  Setup Time   Final    GRAM POSITIVE COCCI IN BOTH AEROBIC AND ANAEROBIC BOTTLES CRITICAL RESULT CALLED TO, READ BACK BY AND VERIFIED WITH: G. ABBOTT PHARMD, AT 0724 06/29/18 BY D. VANHOOK    Culture (A)  Final    STAPHYLOCOCCUS AUREUS BACILLUS SPECIES Standardized susceptibility testing for this organism is not available. CRITICAL RESULT CALLED TO, READ BACK BY AND VERIFIED WITH: Patrick North PHARMD, AT 1519 06/30/18 BY D. VANHOOK REGARDING CULTURE GROWTH Performed at [facility], [address] [place], [place] [address]    Report Status 07/01/2018 FINAL  Final   Organism ID, Bacteria STAPHYLOCOCCUS AUREUS  Final      Susceptibility   Staphylococcus aureus - MIC*    CIPROFLOXACIN <=0.5 SENSITIVE Sensitive     ERYTHROMYCIN <=0.25 SENSITIVE Sensitive     GENTAMICIN <=0.5 SENSITIVE Sensitive     OXACILLIN 0.5 SENSITIVE Sensitive     TETRACYCLINE <=1 SENSITIVE Sensitive     VANCOMYCIN <=0.5  SENSITIVE Sensitive     TRIMETH/SULFA <=10 SENSITIVE Sensitive     CLINDAMYCIN <=0.25 SENSITIVE Sensitive     RIFAMPIN <=0.5 SENSITIVE Sensitive     Inducible Clindamycin NEGATIVE Sensitive     * STAPHYLOCOCCUS AUREUS  Blood Culture ID Panel (Reflexed)     Status: Abnormal   Collection Time: 06/28/18  3:35 PM  Result Value Ref Range Status   Enterococcus species NOT DETECTED NOT DETECTED Final   Listeria monocytogenes NOT DETECTED NOT DETECTED Final   Staphylococcus species DETECTED (A) NOT DETECTED Final    Comment: CRITICAL RESULT CALLED TO, READ BACK BY AND VERIFIED WITH: G. ABBOTT PHARMD, AT 0981 06/29/18 BY D. VANHOOK    Staphylococcus aureus DETECTED (A) NOT DETECTED Final    Comment: Methicillin (oxacillin) susceptible Staphylococcus aureus (MSSA). Preferred therapy is anti staphylococcal beta lactam antibiotic (Cefazolin or Nafcillin), unless clinically contraindicated. CRITICAL RESULT CALLED TO, READ BACK BY AND VERIFIED WITH: G. ABBOTT PHARMD, AT 0724 06/29/18 BY D. VANHOOK    Methicillin resistance NOT DETECTED NOT DETECTED Final   Streptococcus species NOT DETECTED NOT DETECTED Final   Streptococcus agalactiae NOT DETECTED NOT DETECTED Final   Streptococcus pneumoniae NOT DETECTED NOT DETECTED Final   Streptococcus pyogenes NOT DETECTED NOT DETECTED Final   Acinetobacter baumannii NOT DETECTED NOT DETECTED Final   Enterobacteriaceae species NOT DETECTED NOT DETECTED Final   Enterobacter cloacae complex NOT DETECTED NOT DETECTED Final   Escherichia coli NOT DETECTED NOT DETECTED Final   Klebsiella oxytoca NOT DETECTED NOT DETECTED Final   Klebsiella pneumoniae NOT DETECTED NOT DETECTED Final   Proteus species NOT DETECTED NOT DETECTED Final   Serratia marcescens NOT DETECTED NOT DETECTED Final   Haemophilus influenzae NOT DETECTED NOT DETECTED Final   Neisseria meningitidis NOT DETECTED NOT DETECTED Final   Pseudomonas aeruginosa NOT DETECTED NOT DETECTED Final   Candida  albicans NOT DETECTED NOT DETECTED Final   Candida glabrata NOT DETECTED NOT DETECTED Final   Candida krusei NOT DETECTED NOT DETECTED Final   Candida parapsilosis NOT DETECTED NOT DETECTED Final  Candida tropicalis NOT DETECTED NOT DETECTED Final    Comment: Performed at [facility], [address], [place], [place] [address]  Aerobic/Anaerobic Culture (surgical/deep wound)     Status: None   Collection Time: 06/28/18  5:01 PM  Result Value Ref Range Status   Specimen Description ABSCESS LUMBAR  Final   Special Requests NONE  Final   Gram Stain   Final    ABUNDANT WBC PRESENT, PREDOMINANTLY PMN ABUNDANT GRAM POSITIVE COCCI IN PAIRS IN CLUSTERS    Culture   Final    MODERATE STAPHYLOCOCCUS AUREUS NO ANAEROBES ISOLATED Performed at [facility], [address] [place], [place] [address]    Report Status 07/04/2018 FINAL  Final   Organism ID, Bacteria STAPHYLOCOCCUS AUREUS  Final      Susceptibility   Staphylococcus aureus - MIC*    CIPROFLOXACIN <=0.5 SENSITIVE Sensitive     ERYTHROMYCIN <=0.25 SENSITIVE Sensitive     GENTAMICIN <=0.5 SENSITIVE Sensitive     OXACILLIN <=0.25 SENSITIVE Sensitive     TETRACYCLINE <=1 SENSITIVE Sensitive     VANCOMYCIN <=0.5 SENSITIVE Sensitive     TRIMETH/SULFA <=10 SENSITIVE Sensitive     CLINDAMYCIN <=0.25 SENSITIVE Sensitive     RIFAMPIN <=0.5 SENSITIVE Sensitive     Inducible Clindamycin NEGATIVE Sensitive     * MODERATE STAPHYLOCOCCUS AUREUS  MRSA PCR Screening     Status: None   Collection Time: 06/29/18  4:14 PM  Result Value Ref Range Status   MRSA by PCR NEGATIVE NEGATIVE Final    Comment:        The GeneXpert MRSA Assay (FDA approved for NASAL specimens only), is one component of a comprehensive MRSA colonization surveillance program. It is not intended to diagnose MRSA infection nor to guide or monitor treatment for MRSA infections. Performed at [facility], [address] [place], [place] [address]     Aerobic/Anaerobic Culture (surgical/deep wound)     Status: None   Collection Time: 06/29/18  6:08 PM  Result Value Ref Range Status   Specimen Description FLUID LEFT KNEE  Final   Special Requests NONE  Final   Gram Stain   Final    ABUNDANT WBC PRESENT, PREDOMINANTLY PMN RARE GRAM POSITIVE COCCI RESULT CALLED TO, READ BACK BY AND VERIFIED WITH: DR Otelia Sergeant 06/29/18 2012    Culture   Final    RARE STAPHYLOCOCCUS AUREUS NO ANAEROBES ISOLATED Performed at [facility], [address], [place], [place] [address]    Report Status 07/04/2018 FINAL  Final   Organism ID, Bacteria STAPHYLOCOCCUS AUREUS  Final      Susceptibility   Staphylococcus aureus - MIC*    CIPROFLOXACIN <=0.5 SENSITIVE Sensitive     ERYTHROMYCIN <=0.25 SENSITIVE Sensitive     GENTAMICIN <=0.5 SENSITIVE Sensitive     OXACILLIN 0.5 SENSITIVE Sensitive     TETRACYCLINE <=1 SENSITIVE Sensitive     VANCOMYCIN <=0.5 SENSITIVE Sensitive     TRIMETH/SULFA <=10 SENSITIVE Sensitive     CLINDAMYCIN <=0.25 SENSITIVE Sensitive     RIFAMPIN <=0.5 SENSITIVE Sensitive     Inducible Clindamycin NEGATIVE Sensitive     * RARE STAPHYLOCOCCUS AUREUS  Aerobic/Anaerobic Culture (surgical/deep wound)     Status: None   Collection Time: 06/29/18  7:31 PM  Result Value Ref Range Status   Specimen Description ABSCESS  Final   Special Requests LUMBAR ABSCESS  Final   Gram Stain   Final    ABUNDANT WBC  PRESENT,BOTH PMN AND MONONUCLEAR ABUNDANT GRAM POSITIVE COCCI    Culture   Final    ABUNDANT STAPHYLOCOCCUS AUREUS NO ANAEROBES ISOLATED Performed at [facility], [address] [place], [place] [address]    Report Status 07/04/2018 FINAL  Final   Organism ID, Bacteria STAPHYLOCOCCUS AUREUS  Final      Susceptibility   Staphylococcus aureus - MIC*    CIPROFLOXACIN <=0.5 SENSITIVE Sensitive     ERYTHROMYCIN <=0.25 SENSITIVE Sensitive     GENTAMICIN <=0.5 SENSITIVE Sensitive     OXACILLIN 0.5 SENSITIVE Sensitive      TETRACYCLINE <=1 SENSITIVE Sensitive     VANCOMYCIN 1 SENSITIVE Sensitive     TRIMETH/SULFA <=10 SENSITIVE Sensitive     CLINDAMYCIN <=0.25 SENSITIVE Sensitive     RIFAMPIN <=0.5 SENSITIVE Sensitive     Inducible Clindamycin NEGATIVE Sensitive     * ABUNDANT STAPHYLOCOCCUS AUREUS  Culture, blood (routine x 2)     Status: None   Collection Time: 06/30/18  4:52 AM  Result Value Ref Range Status   Specimen Description BLOOD LEFT ARM  Final   Special Requests   Final    BOTTLES DRAWN AEROBIC AND ANAEROBIC Blood Culture adequate volume   Culture   Final    NO GROWTH 5 DAYS Performed at [facility], [address] [place], [place] [address]    Report Status 07/05/2018 FINAL  Final  Culture, blood (routine x 2)     Status: None   Collection Time: 06/30/18  4:55 AM  Result Value Ref Range Status   Specimen Description BLOOD LEFT WRIST  Final   Special Requests   Final    BOTTLES DRAWN AEROBIC AND ANAEROBIC Blood Culture adequate volume   Culture   Final    NO GROWTH 5 DAYS Performed at [facility], [address], [place], [place] [address]    Report Status 07/05/2018 FINAL  Final     Studies: No results found.  Scheduled Meds: . acetaminophen  1,000 mg Oral Q6H  . bisacodyl  10 mg Rectal Q3 days  . calcium-vitamin D  1 tablet Oral Daily  . docusate sodium  100 mg Oral BID  . DULoxetine  60 mg Oral Daily  . enoxaparin (LOVENOX) injection  30 mg Subcutaneous Q24H  . ferrous gluconate  324 mg Oral TID WC  . pantoprazole  40 mg Oral BID AC  . senna-docusate  1 tablet Oral BID  . sodium chloride flush  3 mL Intravenous Q12H  . tamsulosin  0.4 mg Oral Daily  . vitamin B-12  1,000 mcg Oral Daily    Continuous Infusions: . sodium chloride    .  ceFAZolin (ANCEF) IV 2 g (07/06/18 0537)  . methocarbamol (ROBAXIN) IV       Time spent: 25 mins,  I have personally reviewed and interpreted on  07/06/2018 daily labs, imagings as discussed above under date review  session and assessment and plans.  I reviewed all nursing notes, pharmacy notes, consultant notes,  vitals, pertinent old records  I have discussed plan of care as described above with RN , patient and family on 07/06/2018   Albertine Grates MD, PhD  [facility] Pager [phone]. If 7PM-7AM, please contact night-coverage at [email or web address], password [facility] 07/06/2018, 9:47 AM  LOS: 8 days

## 2018-07-06 NOTE — Consult Note (Signed)
WOC Nurse wound follow up Wound type: Surgical Measurement: 10cm x 5cm x 5cm  Wound ZOX:WRUEAbed:Beefy red, bleeds easily Drainage (amount, consistency, odor) serosanguinous Periwound:intact, dry Dressing procedure/placement/frequency:  Dr. Otelia SergeantNitka at bedside for assessment of wound this am and assisted with removal of two pieces of black foam from wound bed. Wound and surrounding skin cleansed and gently patted dry.  Defect filled with two pieces of black foam, covered with drape. periwound skin protected with drape. Black foam "bridge" taken to left hip, secured with drape.  T.R.A.K. Pad attached and negative pressure initiated.  Seal achieved immediately at 125mmHg continuous pressure.  Patient tolerated procedure well.   I was assisted today by Bedside RN, Marcelino DusterMichelle. Her expertise and assistance was appreciated. She will change the patient's VAC cannister later today as it has not yet arrived to the bedside.  Patient and his wife are in agreement with the current POC.  WOC nursing team will follow for M/W/F VAC changes, and will remain available to this patient, the nursing, orthopedic and medical teams.   Thanks, Ladona MowLaurie Talin Feister, MSN, RN, GNP, Hans EdenCWOCN, CWON-AP, FAAN  Pager# [phone]

## 2018-07-06 NOTE — Plan of Care (Signed)
  Problem: Nutrition: Goal: Adequate nutrition will be maintained Outcome: Progressing   Problem: Coping: Goal: Level of anxiety will decrease Outcome: Progressing   Problem: Safety: Goal: Ability to remain free from injury will improve Outcome: Progressing   Problem: Skin Integrity: Goal: Risk for impaired skin integrity will decrease Outcome: Progressing   

## 2018-07-06 NOTE — Progress Notes (Signed)
Inpatient Rehabilitation  Met with patient and spouse at bedside to discuss team's recommendation for IP Rehab.  Shared booklets, insurance verification letter, and answered initial questions.  Patient and spouse are in favor of his care and rehab being managed here.  I have also discussed that case with [facility] discharge planner and she referred me to 3rd party management company for SNF and CIR rehab.  Plan to initiate insurance authorization and follow for timing of medical readiness, insurance authorization, and IP Rehab bed availability.  Call if questions.    Carmelia Roller., CCC/SLP Admission Coordinator  [facility]  Cell [phone]

## 2018-07-06 NOTE — Progress Notes (Signed)
Occupational Therapy Treatment Patient Details Name: [name] [name] MRN: [id number] DOB: [birth year] Today's Date: 07/06/2018    History of present illness [name] Junrnest Cubillos is a 62 y.o. male admitted secondary to profound bilateral LE weakness. Pt with multiple diagnoses including thoracic myelopathy, flaccid paralysic both legs, left cervical radiculopathy, acute spinal cord injury; Pt is now s/p LUMBAR WOUND DEBRIDEMENT DRAINAGE AND IRRIGATION; AND ASPIRATION OF LEFT KNEE ARTHROSCOPY KNEE (Left).   OT comments  Pt making progress with adls and functional mobility. Pt was able to sit on EOB for awhile today and maintain balance. Pt transferred to chair with lateral scoot and max assist x2 instead of 3 today. Pt very motivated and more independent with ads when in the chair.  Pt did partial sit to stand transfers today getting more weight into BLEs with PT blocking knees and OT/tech assisting pt to shift weight up onto a walker.  L shoulder limits pts ability to assist in standing. Wife very supportive.   Follow Up Recommendations  CIR;Supervision/Assistance - 24 hour    Equipment Recommendations  Other (comment)(tbd)    Recommendations for Other Services Rehab consult    Precautions / Restrictions Precautions Precautions: Back;Fall Precaution Booklet Issued: No Precaution Comments: wound VAC; Restrictions Weight Bearing Restrictions: No       Mobility Bed Mobility Overal bed mobility: Needs Assistance Bed Mobility: Rolling;Supine to Sit Rolling: Max assist;+2 for physical assistance Sidelying to sit: Max assist;+2 for physical assistance       General bed mobility comments: Max assistance +2 for rolling and sidelying to sit.  facilitation at hip and scapula to roll, poor ability to reach with LUE until on his side.  Pt performed sidelying to sit with assistance to advance B LEs and elevate trunk into sitting.  pt able to maintain sitting edge of bed once B feet on floor.     Transfers Overall transfer level: Needs assistance Equipment used: Rolling walker (2 wheeled) Transfers: Sit to/from Stand;Lateral/Scoot Transfers Sit to Stand: Max assist;+2 physical assistance;+2 safety/equipment        Lateral/Scoot Transfers: Max assist;+2 physical assistance;+2 safety/equipment General transfer comment: Pt able to clear bottom but unable to stand upright.  RLE significantly weaker than the L and required max assistance to extend B knees into standing.  Pt able to stand for 10 sec before needing to sit due to weakness.  Opted for lateral scoot to drop arm chair for transfer OOB.  +3 assistance to scoot from higher bed to lower chair with use of bed pad.  +3 to brace chair as it began to move despite being locked due to force of the patients weight.      Balance Overall balance assessment: Needs assistance Sitting-balance support: Feet supported;Single extremity supported;Bilateral upper extremity supported Sitting balance-Leahy Scale: Poor Sitting balance - Comments: Pt states once he is comfortable, he can sit EOB for 20 minutes without challenges.   Standing balance support: Bilateral upper extremity supported Standing balance-Leahy Scale: Zero Standing balance comment: Pt unable to achieve full standing but did tranfer weigth onto BLEs with both knees blocked and +3 assist.                           ADL either performed or assessed with clinical judgement   ADL Overall ADL's : Needs assistance/impaired Eating/Feeding: Independent;Sitting Eating/Feeding Details (indicate cue type and reason): Does much better sitting in chair or at side of bed. Grooming: Set up;Sitting   Upper Body  Bathing: Moderate assistance;Sitting Upper Body Bathing Details (indicate cue type and reason): pt able to use L UE To assist when propped on a pillow. Lower Body Bathing: Maximal assistance;+2 for physical assistance Lower Body Bathing Details (indicate cue type and  reason): Pt unable to fully stand at this time. Pt can lean side to side when on EOB to wash back side.  Pt reached to mid calf to bathe LEs from chair. Upper Body Dressing : Moderate assistance;Sitting Upper Body Dressing Details (indicate cue type and reason): Pt instructed to dress LUE first and how to do so with weakness in shoulder. Lower Body Dressing: Maximal assistance;Sit to/from stand;Cueing for compensatory techniques;With adaptive equipment Lower Body Dressing Details (indicate cue type and reason): Pt has reacher, sock aid at home to assist with LE dressing.  Pt unable to stand at this time to stand at this time to pull pants up.  Must roll in bed. Toilet Transfer: Total assistance;+2 for physical assistance;+2 for safety/equipment;Requires wide/bariatric Toilet Transfer Details (indicate cue type and reason): drop arm bariatric BSC will be necessary. Toileting- Clothing Manipulation and Hygiene: Total assistance;+2 for physical assistance       Functional mobility during ADLs: Total assistance;+2 for physical assistance;+2 for safety/equipment General ADL Comments: significantly impaired by pain and decreased ROM for ADL     Vision   Vision Assessment?: No apparent visual deficits   Perception     Praxis      Cognition Arousal/Alertness: Awake/alert Behavior During Therapy: WFL for tasks assessed/performed Overall Cognitive Status: Within Functional Limits for tasks assessed                                 General Comments: Pt very motivated.  Works hard.  Said surgery on L rotator cuff should take place when he recovers from this.         Exercises     Shoulder Instructions       General Comments Pt with wound vac to L flank.    Pertinent Vitals/ Pain       Pain Assessment: 0-10 Pain Score: 2  Faces Pain Scale: Hurts a little bit Pain Location: back and L knee Pain Descriptors / Indicators: Grimacing;Sore;Guarding Pain Intervention(s):  Limited activity within patient's tolerance;Monitored during session;Repositioned  Home Living                                          Prior Functioning/Environment              Frequency  Min 2X/week        Progress Toward Goals  OT Goals(current goals can now be found in the care plan section)  Progress towards OT goals: Progressing toward goals  Acute Rehab OT Goals Patient Stated Goal: to walk in the parallel bars at OP PT OT Goal Formulation: With patient Time For Goal Achievement: 07/16/18 Potential to Achieve Goals: Good ADL Goals Pt Will Perform Grooming: with set-up;sitting Pt Will Perform Lower Body Bathing: with supervision;sitting/lateral leans;with adaptive equipment Pt Will Perform Lower Body Dressing: with max assist;with adaptive equipment;sit to/from stand;with caregiver independent in assisting Pt Will Transfer to Toilet: with max assist;stand pivot transfer;bedside commode  Plan Discharge plan needs to be updated    Co-evaluation    PT/OT/SLP Co-Evaluation/Treatment: Yes Reason for Co-Treatment: Complexity of the patient's impairments (  multi-system involvement);Necessary to address cognition/behavior during functional activity;For patient/therapist safety;To address functional/ADL transfers PT goals addressed during session: Mobility/safety with mobility OT goals addressed during session: ADL's and self-care      AM-PAC PT "6 Clicks" Daily Activity     Outcome Measure   Help from another person eating meals?: None Help from another person taking care of personal grooming?: None Help from another person toileting, which includes using toliet, bedpan, or urinal?: Total Help from another person bathing (including washing, rinsing, drying)?: A Lot Help from another person to put on and taking off regular upper body clothing?: A Lot Help from another person to put on and taking off regular lower body clothing?: Total 6 Click  Score: 14    End of Session Equipment Utilized During Treatment: Gait belt;Rolling walker  OT Visit Diagnosis: Unsteadiness on feet (R26.81);Other abnormalities of gait and mobility (R26.89);Muscle weakness (generalized) (M62.81);History of falling (Z91.81);Pain Pain - Right/Left: Left Pain - part of body: Shoulder;Knee   Activity Tolerance Patient tolerated treatment well   Patient Left in chair;with call bell/phone within reach;with family/visitor present   Nurse Communication Mobility status;Need for lift equipment        Time: 1040-1104 OT Time Calculation (min): 24 min  Charges: OT General Charges $OT Visit: 1 Visit OT Treatments $Self Care/Home Management : 8-22 mins  Tory Emerald, OTR/L [phone]   Hope Budds 07/06/2018, 11:37 AM

## 2018-07-06 NOTE — Progress Notes (Signed)
CIR following, signing off.   Sugar Bush KnollsAshley Muskaan Smet, KentuckyLCSW [phone]

## 2018-07-06 NOTE — Progress Notes (Addendum)
     Subjective: 5 Days Post-Op Procedure(s) (LRB): TRANSESOPHAGEAL ECHOCARDIOGRAM (TEE) (N/A) Awake, alert and oriented x 4. Leukocytosis to 26.8, anemia, may need to check reticulocyte count as his CRP and vital signs and Cr as well as his glucose tolerance suggest clinical improvement.  Void trial unsuccessful, worrisome for neurogenic bladder, he does not sense the urinary catheter. Difficulty with BM improved with enema and Will probably need a bowel regimen colace and q 3 day dulcolax.No chills, minimal fever. Appreciate medicine, infectous disease and rehabilitation input. Still with swelling left wrist, notes numbness left hand diffuse. Has C5 and C6 weakness probably due to cord compression from cervical spondylosis and stenosis.    Patient reports pain as moderate.    Objective:   VITALS:  Temp:  [98.8 F (37.1 C)-100.6 F (38.1 C)] 99.1 F (37.3 C) (09/23 0429) Pulse Rate:  [82-92] 82 (09/23 0429) Resp:  [16-17] 17 (09/23 0429) BP: (129-146)/(74-104) 136/74 (09/23 0429) SpO2:  [99 %-100 %] 100 % (09/23 0429)  ABD soft Intact pulses distally Incision: moderate drainage and VAC dressing changed, Present for change, serosangineous drainage, no purulence.  Weak left biceps and left thumb abduction. Swelling left wrist volar and dorsal. Swelling about the  LABS Recent Labs    07/04/18 0843 07/05/18 0409 07/06/18 0540  HGB 10.1* 10.2* 9.2*  WBC 23.8* 24.9* 26.8*  PLT 150 195 226   Recent Labs    07/05/18 0409 07/06/18 0540  NA 144 142  K 3.9 4.2  CL 110 108  CO2 27 28  BUN 15 11  CREATININE 0.82 0.86  GLUCOSE 126* 118*   No results for input(s): LABPT, INR in the last 72 hours.   Assessment/Plan: 5 Days Post-Op Procedure(s) (LRB): TRANSESOPHAGEAL ECHOCARDIOGRAM (TEE) (N/A) Urinary retention, likely neurogenic bladder. Leukocytosis, steroid exposure, anemia and possible elevated reticulocyte, worsening infection though other clinical indicators are not  worsening, CRP 19 decreased from 30.  Advance diet, obtain nutrition consult due to low albumin.  Up with therapy need to promote bowel function and pulmonary toilet.   Continue ABX therapy due to Staphloccocus  infection Continue foley due to acute urinary retention and likely neurogenic bladder. Start bowel regimen, probable neurogenic bowel if neurogenic bladder. MRI left wrist due to persistent increased WBC and left wrist swelling assess of effusion left wrist. Check reticulocyte count, as patient has elevated WBC, anemia.   Vira BrownsJames Nitka 07/06/2018, 9:05 AMPatient ID: Scot JunErnest Lonsway, male   DOB: 22-Sep-1956, [age] y.o.   MRN: [id number]

## 2018-07-06 NOTE — Progress Notes (Signed)
RN spoke with MRI. Pt is on list but was unable to give time of it it might be done today.

## 2018-07-06 NOTE — Progress Notes (Signed)
Initial Nutrition Assessment  DOCUMENTATION CODES:   Morbid obesity  INTERVENTION:   -Ensure Enlive po TID, each supplement provides 350 kcal and 20 grams of protein -MVI with minerals daily  NUTRITION DIAGNOSIS:   Increased nutrient needs related to post-op healing, wound healing as evidenced by estimated needs.  GOAL:   Patient will meet greater than or equal to 90% of their needs  MONITOR:   PO intake, Supplement acceptance, Labs, Weight trends, Skin, I & O's  REASON FOR ASSESSMENT:   Consult Assessment of nutrition requirement/status, Poor PO  ASSESSMENT:   [name] [name] is a 62 y.o. male who presents for evaluation and physical with a diagnosis of thoracic myelopathy, flaccid paralysic both legs, left cervical radiculopathy. Symptoms are rated as severe, and have been worsening.  This is severely impairing activities of daily living.  9/15- s/p paraspinal fluid collection 9/16- s/p lumbar wound debridement drainage and irrigation, aspiration of lt knee, and arthroscopy of lt knee utilizing portals for irrigation and drainage of lt knee pyarthrosis  9/18- s/p TEE  Reviewed I/O's: -920 ml x 24 hours and +1/4 L since admission  Case discussed with physical therapy assistant and RN prior to visit. Pt is progressing well, but has decreased appetite (but improving).   Spoke with pt and wife; pt sitting in recliner chair at time of visit. Pt shares that PTA he could "eat like a horse". However, pt reports that appetite has been variable during hospitalization. He has been consuming a lot of fruit lately (noted 75% completed fruit cup on tray table). Meal completion 10-75% (averaging around 50% meal completion). Pt reports he has consumed 3 Ensure supplements during hospitalization and has tolerated well. Per pt wife, pt consumed Premier Protein supplements at home.   Pt reports UBW of around 350#. Pt estimates that he has lost "a few pounds" over the past week related to  diuresis poor oral oral intake. Reviewed wt hx; wt has been stable over the past year. Discussed with pt and wife importance of good nutritional intake as well as increased nutrient needs due to post-op healing and wound vac. They are amenable to Ensure supplements. Encouraged pt to consume as much of meals as possible.   Albumin has a half-life of 21 days and is strongly affected by stress response and inflammatory process, therefore, do not expect to see an improvement in this lab value during acute hospitalization. When a patient presents with low albumin, it is likely skewed due to the acute inflammatory response.  Unless it is suspected that patient had poor PO intake or malnutrition prior to admission, then RD should not be consulted solely for low albumin. Note that low albumin is no longer used to diagnose malnutrition; [facility] uses the new malnutrition guidelines published by the American Society for Parenteral and Enteral Nutrition (A.S.P.E.N.) and the Academy of Nutrition and Dietetics (AND).    Labs reviewed.   NUTRITION - FOCUSED PHYSICAL EXAM:    Most Recent Value  Orbital Region  No depletion  Upper Arm Region  No depletion  Thoracic and Lumbar Region  No depletion  Buccal Region  No depletion  Temple Region  No depletion  Clavicle Bone Region  No depletion  Clavicle and Acromion Bone Region  No depletion  Scapular Bone Region  No depletion  Dorsal Hand  No depletion  Patellar Region  No depletion  Anterior Thigh Region  No depletion  Posterior Calf Region  No depletion  Edema (RD Assessment)  Mild  Hair  Reviewed  Eyes  Reviewed  Mouth  Reviewed  Skin  Reviewed  Nails  Reviewed       Diet Order:   Diet Order            Diet regular Room service appropriate? Yes; Fluid consistency: Thin  Diet effective now              EDUCATION NEEDS:   Education needs have been addressed  Skin:  Skin Assessment: Skin Integrity Issues: Skin Integrity Issues::  Incisions Incisions: lt knee  Last BM:  07/06/18  Height:   Ht Readings from Last 1 Encounters:  06/28/18 5\' 10"  (1.778 m)    Weight:   Wt Readings from Last 1 Encounters:  06/28/18 (!) 147.8 kg    Ideal Body Weight:  75.5 kg  BMI:  Body mass index is 46.75 kg/m.  Estimated Nutritional Needs:   Kcal:  2200-2400  Protein:  125-150 kcals  Fluid:  >2.2 L    [name] [name] A. Mayford Knife, RD, LDN, CDE Pager: [phone] After hours Pager: [phone]

## 2018-07-07 ENCOUNTER — Inpatient Hospital Stay (HOSPITAL_COMMUNITY): Payer: No Typology Code available for payment source

## 2018-07-07 DIAGNOSIS — M25532 Pain in left wrist: Secondary | ICD-10-CM

## 2018-07-07 DIAGNOSIS — M75122 Complete rotator cuff tear or rupture of left shoulder, not specified as traumatic: Secondary | ICD-10-CM

## 2018-07-07 DIAGNOSIS — M25462 Effusion, left knee: Secondary | ICD-10-CM

## 2018-07-07 LAB — CBC WITH DIFFERENTIAL/PLATELET
BASOS PCT: 0 %
Basophils Absolute: 0 10*3/uL (ref 0.0–0.1)
EOS PCT: 1 %
Eosinophils Absolute: 0.2 10*3/uL (ref 0.0–0.7)
HEMATOCRIT: 26.4 % — AB (ref 39.0–52.0)
HEMOGLOBIN: 8.4 g/dL — AB (ref 13.0–17.0)
Lymphocytes Relative: 8 %
Lymphs Abs: 1.6 10*3/uL (ref 0.7–4.0)
MCH: 27.7 pg (ref 26.0–34.0)
MCHC: 31.8 g/dL (ref 30.0–36.0)
MCV: 87.1 fL (ref 78.0–100.0)
MONO ABS: 1.4 10*3/uL — AB (ref 0.1–1.0)
MONOS PCT: 7 %
Neutro Abs: 16.9 10*3/uL — ABNORMAL HIGH (ref 1.7–7.7)
Neutrophils Relative %: 84 %
Platelets: 284 10*3/uL (ref 150–400)
RBC: 3.03 MIL/uL — ABNORMAL LOW (ref 4.22–5.81)
RDW: 15.7 % — AB (ref 11.5–15.5)
WBC: 20.1 10*3/uL — ABNORMAL HIGH (ref 4.0–10.5)

## 2018-07-07 LAB — BASIC METABOLIC PANEL
ANION GAP: 8 (ref 5–15)
BUN: 11 mg/dL (ref 6–20)
CALCIUM: 7.9 mg/dL — AB (ref 8.9–10.3)
CHLORIDE: 106 mmol/L (ref 98–111)
CO2: 26 mmol/L (ref 22–32)
CREATININE: 0.8 mg/dL (ref 0.61–1.24)
GFR calc Af Amer: >60 mL/min (ref >60)
GFR calc non Af Amer: >60 mL/min (ref >60)
GLUCOSE: 104 mg/dL — AB (ref 70–99)
Potassium: 3.9 mmol/L (ref 3.5–5.1)
Sodium: 140 mmol/L (ref 135–145)

## 2018-07-07 LAB — PATHOLOGIST SMEAR REVIEW

## 2018-07-07 NOTE — Progress Notes (Signed)
Physical Therapy Treatment Patient Details Name: [name] [name] MRN: [id number] DOB: 1956-08-23 Today's Date: 07/07/2018    History of Present Illness Kush Farabee is a 62 y.o. male admitted secondary to profound bilateral LE weakness. Pt with multiple diagnoses including thoracic myelopathy, flaccid paralysic both legs, left cervical radiculopathy, acute spinal cord injury; Pt is now s/p LUMBAR WOUND DEBRIDEMENT DRAINAGE AND IRRIGATION; AND ASPIRATION OF LEFT KNEE ARTHROSCOPY KNEE (Left).    PT Comments    Pt performed transfer training and bed mobility during session.  Pt appears more fatigued in the pm and will likely do better in am.  Pt unable to clear bottom during session and lacks knee extension.  Plan for intensive therapy remains appropriate for patient to improve strength and function before returning home.     Follow Up Recommendations  Supervision/Assistance - 24 hour;CIR     Equipment Recommendations  None recommended by PT    Recommendations for Other Services       Precautions / Restrictions Precautions Precautions: Back;Fall Precaution Booklet Issued: No Precaution Comments: wound VAC; Restrictions Weight Bearing Restrictions: No    Mobility  Bed Mobility Overal bed mobility: Needs Assistance Bed Mobility: Rolling Rolling: Max assist(progressed to +1 assistance for bed mobility.  )         General bed mobility comments: Pt able to roll and move from sidelying to sit with +1 assistance.    Transfers Overall transfer level: Needs assistance Equipment used: Rolling walker (2 wheeled) Transfers: Sit to/from Stand;Lateral/Scoot Transfers Sit to Stand: Max assist;+2 physical assistance;+2 safety/equipment(+3 for sit to stand.  +2 for lift assistance and +3 to block B knees.  )        Lateral/Scoot Transfers: Max assist;+2 physical assistance;+2 safety/equipment General transfer comment: Pt with increased difficulty attempting to stand, Pt unable to achieve  knee extension in standing and only slightly clearing buttocks from edge of bed.  Attempted x3 before attempting Lateral scoot from bed to recliner chair.  Pt required multiple reps of scooting with posterior lean due to poor core stability.    Ambulation/Gait Ambulation/Gait assistance: (NT)               Stairs             Wheelchair Mobility    Modified Rankin (Stroke Patients Only)       Balance Overall balance assessment: Needs assistance   Sitting balance-Leahy Scale: Poor       Standing balance-Leahy Scale: Poor Standing balance comment: Pt unable to achieve full standing but did transfer weigth onto BLEs with both knees blocked and +3 assist.                            Cognition Arousal/Alertness: Awake/alert Behavior During Therapy: WFL for tasks assessed/performed Overall Cognitive Status: Within Functional Limits for tasks assessed                                 General Comments: Pt very motivated.  Works hard.  Said surgery on L rotator cuff should take place when he recovers from this.       Exercises      General Comments        Pertinent Vitals/Pain Pain Assessment: 0-10 Pain Score: 5  Pain Location: back and L knee Pain Descriptors / Indicators: Grimacing;Sore;Guarding Pain Intervention(s): Monitored during session;Repositioned    Home Living  Prior Function            PT Goals (current goals can now be found in the care plan section) Acute Rehab PT Goals Patient Stated Goal: to walk in the parallel bars at OP PT Potential to Achieve Goals: Good Progress towards PT goals: Progressing toward goals    Frequency    Min 4X/week      PT Plan Current plan remains appropriate    Co-evaluation              AM-PAC PT "6 Clicks" Daily Activity  Outcome Measure  Difficulty turning over in bed (including adjusting bedclothes, sheets and blankets)?:  Unable Difficulty moving from lying on back to sitting on the side of the bed? : Unable Difficulty sitting down on and standing up from a chair with arms (e.g., wheelchair, bedside commode, etc,.)?: Unable Help needed moving to and from a bed to chair (including a wheelchair)?: A Lot Help needed walking in hospital room?: Total Help needed climbing 3-5 steps with a railing? : Total 6 Click Score: 7    End of Session Equipment Utilized During Treatment: Gait belt Activity Tolerance: Patient tolerated treatment well Patient left: in chair;with call bell/phone within reach;with family/visitor present Nurse Communication: Mobility status;Need for lift equipment PT Visit Diagnosis: Other abnormalities of gait and mobility (R26.89);Pain Pain - Right/Left: Left Pain - part of body: Knee(and back)     Time: [id number] PT Time Calculation (min) (ACUTE ONLY): 29 min  Charges:  $Therapeutic Activity: 23-37 mins                     Joycelyn RuaAimee Rajendra Spiller, PTA Acute Rehabilitation Services Pager [phone] Office [phone]     Novalynn Branaman Artis DelayJ Ineta Sinning 07/07/2018, 5:46 PM

## 2018-07-07 NOTE — Progress Notes (Signed)
     Subjective: 6 Days Post-Op Procedure(s) (LRB): TRANSESOPHAGEAL ECHOCARDIOGRAM (TEE) (N/A) Vac changed yesterday, today's WBC is 20K, patient is anemic Hgb is 8.4, Hct Patient reports pain as moderate.    Objective:   VITALS:  Temp:  [98.2 F (36.8 C)-98.7 F (37.1 C)] 98.3 F (36.8 C) (09/24 0737) Pulse Rate:  [86-90] 90 (09/24 0737) Resp:  [16-18] 18 (09/24 0737) BP: (110-132)/(64-77) 132/77 (09/24 0737) SpO2:  [99 %-100 %] 99 % (09/24 0737)  ABD soft Incision: moderate drainage Vac changed.   LABS Recent Labs    07/05/18 0409 07/06/18 0540 07/07/18 0448  HGB 10.2* 9.2* 8.4*  WBC 24.9* 26.8* 20.1*  PLT 195 226 284   Recent Labs    07/06/18 0540 07/07/18 0448  NA 142 140  K 4.2 3.9  CL 108 106  CO2 28 26  BUN 11 11  CREATININE 0.86 0.80  GLUCOSE 118* 104*   No results for input(s): LABPT, INR in the last 72 hours.   Assessment/Plan: 6 Days Post-Op Procedure(s) (LRB): TRANSESOPHAGEAL ECHOCARDIOGRAM (TEE) (N/A)  Advance diet Up with therapy D/C IV fluids Continue ABX therapy due to Post-op infection  Vira BrownsJames Nitka 07/07/2018, 8:54 AMPatient ID: [name] [name], male   DOB: January 31, 1956, 62 y.o.   MRN: [id number]

## 2018-07-07 NOTE — Progress Notes (Signed)
Inpatient Rehabilitation  Continuing to follow for timing of medical readiness, insurance authorization and IP Rehab bed availability.  Call if questions.   Charlane FerrettiMelissa Aysia Lowder, M.A., CCC/SLP Admission Coordinator  [facility]  Cell [phone]

## 2018-07-07 NOTE — Progress Notes (Signed)
MRI of wrist attempted on patient.  He is unable to get his arm above his head.  Unable to scan with arm down beside him due to body habitus.

## 2018-07-07 NOTE — Plan of Care (Signed)

## 2018-07-07 NOTE — Progress Notes (Signed)
Received call from radiology stating that they were unable do MRI d/t patient not able to get arm above his head. MD notified. Patient continue to have edema to Lt hand but has gone down since am. Patient continue to elevate extremity. Will continue to monitor.

## 2018-07-07 NOTE — Progress Notes (Signed)
INFECTIOUS DISEASE PROGRESS NOTE  ID: [name] [name] is a 62 y.o. male with  Principal Problem:   Spondylosis, thoracic, with myelopathy Active Problems:   Thoracic myelopathy   Abscess in epidural space of L2-L5 lumbar spine   Acute urinary retention   Left cervical radiculopathy   Leukocytosis   Postoperative seroma involving nervous system after nervous system procedure   Shoulder pain, bilateral   Septic arthritis of knee, left (HCC)   Complete tear of left rotator cuff   AKI (acute kidney injury) (HCC)   MSSA bacteremia   Abscess in epidural space of lumbar spine   Effusion, left knee   Pain and swelling of wrist, left  Subjective: C/o back pain and L knee pain.   Abtx:  Anti-infectives (From admission, onward)   Start     Dose/Rate Route Frequency Ordered Stop   06/29/18 1936  vancomycin (VANCOCIN) powder  Status:  Discontinued       As needed 06/29/18 1936 06/29/18 2121   06/29/18 1928  polymyxin B 500,000 Units, bacitracin 50,000 Units in sodium chloride 0.9 % 500 mL irrigation  Status:  Discontinued       As needed 06/29/18 1928 06/29/18 2121   06/29/18 1800  vancomycin (VANCOCIN) IVPB 1000 mg/200 mL premix  Status:  Discontinued     1,000 mg 200 mL/hr over 60 Minutes Intravenous  Once 06/29/18 0032 06/29/18 1042   06/29/18 1400  ceFAZolin (ANCEF) IVPB 2g/100 mL premix     2 g 200 mL/hr over 30 Minutes Intravenous Every 8 hours 06/29/18 1042     06/29/18 0200  ceFEPIme (MAXIPIME) 2 g in sodium chloride 0.9 % 100 mL IVPB  Status:  Discontinued     2 g 200 mL/hr over 30 Minutes Intravenous Every 8 hours 06/29/18 0031 06/29/18 1042   06/29/18 0130  vancomycin (VANCOCIN) 2,000 mg in sodium chloride 0.9 % 500 mL IVPB     2,000 mg 250 mL/hr over 120 Minutes Intravenous  Once 06/29/18 0032 06/29/18 0257      Medications:  Scheduled: . acetaminophen  1,000 mg Oral Q6H  . bisacodyl  10 mg Rectal Q3 days  . calcium-vitamin D  1 tablet Oral Daily  . docusate sodium   100 mg Oral BID  . DULoxetine  60 mg Oral Daily  . enoxaparin (LOVENOX) injection  30 mg Subcutaneous Q24H  . feeding supplement (ENSURE ENLIVE)  237 mL Oral TID BM  . ferrous gluconate  324 mg Oral TID WC  . multivitamin with minerals  1 tablet Oral Daily  . pantoprazole  40 mg Oral BID AC  . senna-docusate  1 tablet Oral BID  . sodium chloride flush  3 mL Intravenous Q12H  . tamsulosin  0.4 mg Oral Daily  . vitamin B-12  1,000 mcg Oral Daily    Objective: Vital signs in last 24 hours: Temp:  [98.2 F (36.8 C)-98.7 F (37.1 C)] 98.3 F (36.8 C) (09/24 0737) Pulse Rate:  [86-90] 90 (09/24 0737) Resp:  [16-18] 18 (09/24 0737) BP: (110-132)/(64-77) 132/77 (09/24 0737) SpO2:  [99 %-100 %] 99 % (09/24 0737)   General appearance: alert, cooperative and no distress Resp: clear to auscultation bilaterally Cardio: regular rate and rhythm GI: normal findings: bowel sounds normal and soft, non-tender Extremities: L knee swollen, no heat. L wrist swollen, improved movement, less pain.   Lab Results Recent Labs    07/06/18 0540 07/07/18 0448  WBC 26.8* 20.1*  HGB 9.2* 8.4*  HCT 28.9*  26.4*  NA 142 140  K 4.2 3.9  CL 108 106  CO2 28 26  BUN 11 11  CREATININE 0.86 0.80   Liver Panel No results for input(s): PROT, ALBUMIN, AST, ALT, ALKPHOS, BILITOT, BILIDIR, IBILI in the last 72 hours. Sedimentation Rate No results for input(s): ESRSEDRATE in the last 72 hours. C-Reactive Protein No results for input(s): CRP in the last 72 hours.  Microbiology: Recent Results (from the past 240 hour(s))  Culture, Urine     Status: Abnormal   Collection Time: 06/28/18 11:59 AM  Result Value Ref Range Status   Specimen Description URINE, CATHETERIZED  Final   Special Requests   Final    Normal Performed at [facility], [address], [place], [place] [address]    Culture MULTIPLE SPECIES PRESENT, SUGGEST RECOLLECTION (A)  Final   Report Status 06/29/2018 FINAL  Final  Culture,  blood (routine x 2)     Status: Abnormal   Collection Time: 06/28/18  3:30 PM  Result Value Ref Range Status   Specimen Description BLOOD LEFT ANTECUBITAL  Final   Special Requests   Final    BOTTLES DRAWN AEROBIC AND ANAEROBIC Blood Culture adequate volume   Culture  Setup Time   Final    GRAM POSITIVE COCCI IN BOTH AEROBIC AND ANAEROBIC BOTTLES CRITICAL RESULT CALLED TO, READ BACK BY AND VERIFIED WITH: G. ABBOTT PHARMD, AT 0724 06/29/18 BY D. VANHOOK    Culture (A)  Final    STAPHYLOCOCCUS AUREUS SUSCEPTIBILITIES PERFORMED ON PREVIOUS CULTURE WITHIN THE LAST 5 DAYS. Performed at [facility], [address], [place], [place] [address]    Report Status 07/01/2018 FINAL  Final  Culture, blood (routine x 2)     Status: Abnormal   Collection Time: 06/28/18  3:35 PM  Result Value Ref Range Status   Specimen Description BLOOD LEFT HAND  Final   Special Requests   Final    BOTTLES DRAWN AEROBIC AND ANAEROBIC Blood Culture adequate volume   Culture  Setup Time   Final    GRAM POSITIVE COCCI IN BOTH AEROBIC AND ANAEROBIC BOTTLES CRITICAL RESULT CALLED TO, READ BACK BY AND VERIFIED WITH: G. ABBOTT PHARMD, AT 0724 06/29/18 BY D. VANHOOK    Culture (A)  Final    STAPHYLOCOCCUS AUREUS BACILLUS SPECIES Standardized susceptibility testing for this organism is not available. CRITICAL RESULT CALLED TO, READ BACK BY AND VERIFIED WITH: Patrick North PHARMD, AT 1519 06/30/18 BY D. VANHOOK REGARDING CULTURE GROWTH Performed at [facility], [address] [place], [place] [address]    Report Status 07/01/2018 FINAL  Final   Organism ID, Bacteria STAPHYLOCOCCUS AUREUS  Final      Susceptibility   Staphylococcus aureus - MIC*    CIPROFLOXACIN <=0.5 SENSITIVE Sensitive     ERYTHROMYCIN <=0.25 SENSITIVE Sensitive     GENTAMICIN <=0.5 SENSITIVE Sensitive     OXACILLIN 0.5 SENSITIVE Sensitive     TETRACYCLINE <=1 SENSITIVE Sensitive     VANCOMYCIN <=0.5 SENSITIVE Sensitive      TRIMETH/SULFA <=10 SENSITIVE Sensitive     CLINDAMYCIN <=0.25 SENSITIVE Sensitive     RIFAMPIN <=0.5 SENSITIVE Sensitive     Inducible Clindamycin NEGATIVE Sensitive     * STAPHYLOCOCCUS AUREUS  Blood Culture ID Panel (Reflexed)     Status: Abnormal   Collection Time: 06/28/18  3:35 PM  Result Value Ref Range Status   Enterococcus species NOT DETECTED NOT DETECTED Final   Listeria monocytogenes  NOT DETECTED NOT DETECTED Final   Staphylococcus species DETECTED (A) NOT DETECTED Final    Comment: CRITICAL RESULT CALLED TO, READ BACK BY AND VERIFIED WITH: G. ABBOTT PHARMD, AT 9604 06/29/18 BY D. VANHOOK    Staphylococcus aureus DETECTED (A) NOT DETECTED Final    Comment: Methicillin (oxacillin) susceptible Staphylococcus aureus (MSSA). Preferred therapy is anti staphylococcal beta lactam antibiotic (Cefazolin or Nafcillin), unless clinically contraindicated. CRITICAL RESULT CALLED TO, READ BACK BY AND VERIFIED WITH: G. ABBOTT PHARMD, AT 0724 06/29/18 BY D. VANHOOK    Methicillin resistance NOT DETECTED NOT DETECTED Final   Streptococcus species NOT DETECTED NOT DETECTED Final   Streptococcus agalactiae NOT DETECTED NOT DETECTED Final   Streptococcus pneumoniae NOT DETECTED NOT DETECTED Final   Streptococcus pyogenes NOT DETECTED NOT DETECTED Final   Acinetobacter baumannii NOT DETECTED NOT DETECTED Final   Enterobacteriaceae species NOT DETECTED NOT DETECTED Final   Enterobacter cloacae complex NOT DETECTED NOT DETECTED Final   Escherichia coli NOT DETECTED NOT DETECTED Final   Klebsiella oxytoca NOT DETECTED NOT DETECTED Final   Klebsiella pneumoniae NOT DETECTED NOT DETECTED Final   Proteus species NOT DETECTED NOT DETECTED Final   Serratia marcescens NOT DETECTED NOT DETECTED Final   Haemophilus influenzae NOT DETECTED NOT DETECTED Final   Neisseria meningitidis NOT DETECTED NOT DETECTED Final   Pseudomonas aeruginosa NOT DETECTED NOT DETECTED Final   Candida albicans NOT DETECTED NOT  DETECTED Final   Candida glabrata NOT DETECTED NOT DETECTED Final   Candida krusei NOT DETECTED NOT DETECTED Final   Candida parapsilosis NOT DETECTED NOT DETECTED Final   Candida tropicalis NOT DETECTED NOT DETECTED Final    Comment: Performed at [facility], [address], [place], [place] [address]  Aerobic/Anaerobic Culture (surgical/deep wound)     Status: None   Collection Time: 06/28/18  5:01 PM  Result Value Ref Range Status   Specimen Description ABSCESS LUMBAR  Final   Special Requests NONE  Final   Gram Stain   Final    ABUNDANT WBC PRESENT, PREDOMINANTLY PMN ABUNDANT GRAM POSITIVE COCCI IN PAIRS IN CLUSTERS    Culture   Final    MODERATE STAPHYLOCOCCUS AUREUS NO ANAEROBES ISOLATED Performed at [facility], [address], [place], [place] [address]    Report Status 07/04/2018 FINAL  Final   Organism ID, Bacteria STAPHYLOCOCCUS AUREUS  Final      Susceptibility   Staphylococcus aureus - MIC*    CIPROFLOXACIN <=0.5 SENSITIVE Sensitive     ERYTHROMYCIN <=0.25 SENSITIVE Sensitive     GENTAMICIN <=0.5 SENSITIVE Sensitive     OXACILLIN <=0.25 SENSITIVE Sensitive     TETRACYCLINE <=1 SENSITIVE Sensitive     VANCOMYCIN <=0.5 SENSITIVE Sensitive     TRIMETH/SULFA <=10 SENSITIVE Sensitive     CLINDAMYCIN <=0.25 SENSITIVE Sensitive     RIFAMPIN <=0.5 SENSITIVE Sensitive     Inducible Clindamycin NEGATIVE Sensitive     * MODERATE STAPHYLOCOCCUS AUREUS  MRSA PCR Screening     Status: None   Collection Time: 06/29/18  4:14 PM  Result Value Ref Range Status   MRSA by PCR NEGATIVE NEGATIVE Final    Comment:        The GeneXpert MRSA Assay (FDA approved for NASAL specimens only), is one component of a comprehensive MRSA colonization surveillance program. It is not intended to diagnose MRSA infection nor to guide or monitor treatment for MRSA infections. Performed at [facility], [address], [place], [place]  [address]   Aerobic/Anaerobic Culture  (surgical/deep wound)     Status: None   Collection Time: 06/29/18  6:08 PM  Result Value Ref Range Status   Specimen Description FLUID LEFT KNEE  Final   Special Requests NONE  Final   Gram Stain   Final    ABUNDANT WBC PRESENT, PREDOMINANTLY PMN RARE GRAM POSITIVE COCCI RESULT CALLED TO, READ BACK BY AND VERIFIED WITH: DR Otelia Sergeant 06/29/18 2012    Culture   Final    RARE STAPHYLOCOCCUS AUREUS NO ANAEROBES ISOLATED Performed at [facility], [address], [place], [place] [address]    Report Status 07/04/2018 FINAL  Final   Organism ID, Bacteria STAPHYLOCOCCUS AUREUS  Final      Susceptibility   Staphylococcus aureus - MIC*    CIPROFLOXACIN <=0.5 SENSITIVE Sensitive     ERYTHROMYCIN <=0.25 SENSITIVE Sensitive     GENTAMICIN <=0.5 SENSITIVE Sensitive     OXACILLIN 0.5 SENSITIVE Sensitive     TETRACYCLINE <=1 SENSITIVE Sensitive     VANCOMYCIN <=0.5 SENSITIVE Sensitive     TRIMETH/SULFA <=10 SENSITIVE Sensitive     CLINDAMYCIN <=0.25 SENSITIVE Sensitive     RIFAMPIN <=0.5 SENSITIVE Sensitive     Inducible Clindamycin NEGATIVE Sensitive     * RARE STAPHYLOCOCCUS AUREUS  Aerobic/Anaerobic Culture (surgical/deep wound)     Status: None   Collection Time: 06/29/18  7:31 PM  Result Value Ref Range Status   Specimen Description ABSCESS  Final   Special Requests LUMBAR ABSCESS  Final   Gram Stain   Final    ABUNDANT WBC PRESENT,BOTH PMN AND MONONUCLEAR ABUNDANT GRAM POSITIVE COCCI    Culture   Final    ABUNDANT STAPHYLOCOCCUS AUREUS NO ANAEROBES ISOLATED Performed at [facility], [address], [place], [place] [address]    Report Status 07/04/2018 FINAL  Final   Organism ID, Bacteria STAPHYLOCOCCUS AUREUS  Final      Susceptibility   Staphylococcus aureus - MIC*    CIPROFLOXACIN <=0.5 SENSITIVE Sensitive     ERYTHROMYCIN <=0.25 SENSITIVE Sensitive     GENTAMICIN <=0.5 SENSITIVE Sensitive     OXACILLIN 0.5 SENSITIVE Sensitive     TETRACYCLINE <=1 SENSITIVE  Sensitive     VANCOMYCIN 1 SENSITIVE Sensitive     TRIMETH/SULFA <=10 SENSITIVE Sensitive     CLINDAMYCIN <=0.25 SENSITIVE Sensitive     RIFAMPIN <=0.5 SENSITIVE Sensitive     Inducible Clindamycin NEGATIVE Sensitive     * ABUNDANT STAPHYLOCOCCUS AUREUS  Culture, blood (routine x 2)     Status: None   Collection Time: 06/30/18  4:52 AM  Result Value Ref Range Status   Specimen Description BLOOD LEFT ARM  Final   Special Requests   Final    BOTTLES DRAWN AEROBIC AND ANAEROBIC Blood Culture adequate volume   Culture   Final    NO GROWTH 5 DAYS Performed at [facility], [address] [place], [place] [address]    Report Status 07/05/2018 FINAL  Final  Culture, blood (routine x 2)     Status: None   Collection Time: 06/30/18  4:55 AM  Result Value Ref Range Status   Specimen Description BLOOD LEFT WRIST  Final   Special Requests   Final    BOTTLES DRAWN AEROBIC AND ANAEROBIC Blood Culture adequate volume   Culture   Final    NO GROWTH 5 DAYS Performed at [facility], [address], [place], [place] [address]  Report Status 07/05/2018 FINAL  Final    Studies/Results: No results found.   Assessment/Plan: Lumbar abscess (debrided 9-16) MSSA bacteremia AKI- resolved L supraspinatus cuff tear L wrist pain and swelling Leukocytosis  Total days of antibiotics: 9 ancef  Wbc better, afeb.  He was unable to get MRI due to size and inability to move his arm.  Will send him for CT of his L forearm with contrast. discussed with pt           [name] SaxJeffrey Monzerrath Mcburney MD, FACP Infectious Diseases (pager) [phone] [email or web address] 07/07/2018, 10:21 AM  LOS: 9 days

## 2018-07-07 NOTE — Progress Notes (Signed)
VAST RN to bedside to assess pt for IV access. No appropriate veins visible in either arm by this RN. Pt verbalized he is a hard stick and IV team has used US in past. Pt currently has no IV access and is in need of PIV for pain medication d/t increased pain not being alleviated by oral pain medication.

## 2018-07-07 NOTE — Progress Notes (Signed)
Consultation PROGRESS NOTE  [name] [name] ZOX:[id number] DOB: 06/08/1956 DOA: 06/27/2018 PCP: Renford Dills, MD  HPI/Recap of past 24 hours:  Cr normalized, Urine output 1.3liter,  Appetite is improving No fever last 24hrs left knee pain, left shoulder pain, back pain is improving, wound vac in place with serosanguinous drainange  c/o left wrist swollen and pain, He could not tolerate mri due to not able to lift left arm, he is to get a CT left wrist instead   Assessment/Plan: Principal Problem:   Spondylosis, thoracic, with myelopathy Active Problems:   Thoracic myelopathy   Abscess in epidural space of L2-L5 lumbar spine   Acute urinary retention   Left cervical radiculopathy   Leukocytosis   Postoperative seroma involving nervous system after nervous system procedure   Shoulder pain, bilateral   Septic arthritis of knee, left (HCC)   Complete tear of left rotator cuff   AKI (acute kidney injury) (HCC)   MSSA bacteremia   Abscess in epidural space of lumbar spine   Effusion, left knee   Pain and swelling of wrist, left  Sepsis secondary to MSSA bacteremia and lumbar epidural abscess (presenting symptom). -s/pLUMBAR WOUND DEBRIDEMENT DRAINAGE AND IRRIGATION on 9/17 -TEE no endocarditis, repeat Blood culture no growth -Wound vac to back last changed on 9/20 - on ancef,  Afebrile.  but remain to have leukocytosis, ortho is getting mri of the wrist, he could not tolerate mri due to not able to lift left arm, he is to get a ct left wrist in steady -  case discussed with ID Dr Ninetta Lights who is ok to proceed with picc line placement once repeat blood culture is negative, Continue Ancef IV for 6 weeks until 10/28 , and follow up with ID NP Marcos Eke or MD Dr Ninetta Lights  Left wrist swelling -Has some tenderness but no obvious joint tenderness.  X-ray shows soft tissue swelling only.  If symptoms persist and may need synovial aspiration of the joint by primary. -left wrist more  swollen, warm to touch and mild tender today on 9/23 -plan per ortho  Septic arthritis, left knee S/p ARTHROSCOPY KNEE UTILIZING PORTALS FOR IRRIGATION AND DRAINAGE OF LEFT KNEE PYARTHROSIS. Left knee fluids culture+ MSSA pan sensitivy Plan per ID and primary team  Left shoulder pain -mri left shoulder "Full-thickness retracted supraspinatus tendon tear measuring 28 x 22 mm. " -plan per primary team  cervical and thoracic myelopathy.  Seen by neurosurgery Dr Jordan Likes who recommends some form of decompressive surgery once acute issues have resolved.   Acute anemia with baseline anemia of chronic disease: -baseline hgb range from 11-12 -hgb continue to  drift down after  ivf stopped on 9/22  -wound vac with serosanguinous drain -no other sign of blood loss - hgb today is 8.4 on 9/24 -repeat cbc in am,  transfuse to keep hgb >7   AKI on CKDII Renal US no obstruction He did has urinary retention on admission with required foley placement, foley still in place  He was on NSAIDS at home Nephrology consulted, he received hydration, cr peaked at 2.99, cr normalized since 9/21  ivf d/ced on 9/22, encourage oral intake,   Urinary retention/neurogenic bladder? Foley reinserted on 9/21 after failed voiding trial continue flomax Mobilize   Constipation: resolved    Morbid obesity: Body mass index is 46.75 kg/m.   Code Status: full  Family Communication: patient   Disposition Plan: per primary team, cir vs snf, need to place picc line for iv abx  prior to discharge   Consultants:  [facility]  Infectious disease   nephrology  Procedures:  As above  Antibiotics:  As above   Objective: BP (!) 125/56 (BP Location: Left Arm)   Pulse (!) 102   Temp 98.8 F (37.1 C) (Oral)   Resp (!) 22   Ht 5\' 10"  (1.778 m)   Wt (!) 147.8 kg   SpO2 96%   BMI 46.75 kg/m   Intake/Output Summary (Last 24 hours) at 07/07/2018 1522 Last data filed at 07/07/2018 1428 Gross  per 24 hour  Intake 1320 ml  Output 1825 ml  Net -505 ml   Filed Weights   06/28/18 1424  Weight: (!) 147.8 kg    Exam: Patient is examined daily including today on 07/07/2018, exams remain the same as of yesterday except that has changed    General:  NAD, obese, wound vac to back  Cardiovascular: RRR  Respiratory: CTABL  Abdomen: Soft/ND/NT, positive BS  Musculoskeletal:  Left dorsal hand/wrist  Warm to touch, more swollen today, left knee lateral side slightly swollen   Neuro: alert, oriented   Data Reviewed: Basic Metabolic Panel: Recent Labs  Lab 07/03/18 0425 07/04/18 0843 07/05/18 0409 07/06/18 0540 07/07/18 0448  NA 141 142 144 142 140  K 3.7 4.1 3.9 4.2 3.9  CL 111 110 110 108 106  CO2 23 25 27 28 26   GLUCOSE 111* 120* 126* 118* 104*  BUN 43* 20 15 11 11   CREATININE 1.23 0.87 0.82 0.86 0.80  CALCIUM 8.0* 8.0* 8.0* 7.8* 7.9*  MG  --   --   --  1.9  --    Liver Function Tests: Recent Labs  Lab 07/02/18 0318 07/02/18 0701 07/03/18 0425  AST QUANTITY NOT SUFFICIENT, UNABLE TO PERFORM TEST 55* 32  ALT QUANTITY NOT SUFFICIENT, UNABLE TO PERFORM TEST 16 11  ALKPHOS 102  --  114  BILITOT QUANTITY NOT SUFFICIENT, UNABLE TO PERFORM TEST 0.7 0.9  PROT 5.6*  --  5.9*  ALBUMIN 1.9*  --  2.0*   No results for input(s): LIPASE, AMYLASE in the last 168 hours. No results for input(s): AMMONIA in the last 168 hours. CBC: Recent Labs  Lab 07/03/18 0425 07/04/18 0843 07/05/18 0409 07/06/18 0540 07/07/18 0448  WBC 14.9* 23.8* 24.9* 26.8* 20.1*  NEUTROABS 13.2*  --  21.4* 23.6* 16.9*  HGB 11.1* 10.1* 10.2* 9.2* 8.4*  HCT 34.7* 31.0* 31.8* 28.9* 26.4*  MCV 86.3 85.6 87.1 86.8 87.1  PLT 115* 150 195 226 284   Cardiac Enzymes:   Recent Labs  Lab 07/01/18 1651  CKTOTAL 89   BNP (last 3 results) No results for input(s): BNP in the last 8760 hours.  ProBNP (last 3 results) No results for input(s): PROBNP in the last 8760 hours.  CBG: No results for  input(s): GLUCAP in the last 168 hours.  Recent Results (from the past 240 hour(s))  Culture, Urine     Status: Abnormal   Collection Time: 06/28/18 11:59 AM  Result Value Ref Range Status   Specimen Description URINE, CATHETERIZED  Final   Special Requests   Final    Normal Performed at [facility], [address], [place], [place] [address]    Culture MULTIPLE SPECIES PRESENT, SUGGEST RECOLLECTION (A)  Final   Report Status 06/29/2018 FINAL  Final  Culture, blood (routine x 2)     Status: Abnormal   Collection Time: 06/28/18  3:30 PM  Result Value Ref Range Status  Specimen Description BLOOD LEFT ANTECUBITAL  Final   Special Requests   Final    BOTTLES DRAWN AEROBIC AND ANAEROBIC Blood Culture adequate volume   Culture  Setup Time   Final    GRAM POSITIVE COCCI IN BOTH AEROBIC AND ANAEROBIC BOTTLES CRITICAL RESULT CALLED TO, READ BACK BY AND VERIFIED WITH: G. ABBOTT PHARMD, AT 0724 06/29/18 BY D. VANHOOK    Culture (A)  Final    STAPHYLOCOCCUS AUREUS SUSCEPTIBILITIES PERFORMED ON PREVIOUS CULTURE WITHIN THE LAST 5 DAYS. Performed at [facility], [address], [place], [place] [address]    Report Status 07/01/2018 FINAL  Final  Culture, blood (routine x 2)     Status: Abnormal   Collection Time: 06/28/18  3:35 PM  Result Value Ref Range Status   Specimen Description BLOOD LEFT HAND  Final   Special Requests   Final    BOTTLES DRAWN AEROBIC AND ANAEROBIC Blood Culture adequate volume   Culture  Setup Time   Final    GRAM POSITIVE COCCI IN BOTH AEROBIC AND ANAEROBIC BOTTLES CRITICAL RESULT CALLED TO, READ BACK BY AND VERIFIED WITH: G. ABBOTT PHARMD, AT 0724 06/29/18 BY D. VANHOOK    Culture (A)  Final    STAPHYLOCOCCUS AUREUS BACILLUS SPECIES Standardized susceptibility testing for this organism is not available. CRITICAL RESULT CALLED TO, READ BACK BY AND VERIFIED WITH: Patrick North PHARMD, AT 1519 06/30/18 BY D. VANHOOK REGARDING CULTURE GROWTH Performed  at [facility], [address], [place], [place] [address]    Report Status 07/01/2018 FINAL  Final   Organism ID, Bacteria STAPHYLOCOCCUS AUREUS  Final      Susceptibility   Staphylococcus aureus - MIC*    CIPROFLOXACIN <=0.5 SENSITIVE Sensitive     ERYTHROMYCIN <=0.25 SENSITIVE Sensitive     GENTAMICIN <=0.5 SENSITIVE Sensitive     OXACILLIN 0.5 SENSITIVE Sensitive     TETRACYCLINE <=1 SENSITIVE Sensitive     VANCOMYCIN <=0.5 SENSITIVE Sensitive     TRIMETH/SULFA <=10 SENSITIVE Sensitive     CLINDAMYCIN <=0.25 SENSITIVE Sensitive     RIFAMPIN <=0.5 SENSITIVE Sensitive     Inducible Clindamycin NEGATIVE Sensitive     * STAPHYLOCOCCUS AUREUS  Blood Culture ID Panel (Reflexed)     Status: Abnormal   Collection Time: 06/28/18  3:35 PM  Result Value Ref Range Status   Enterococcus species NOT DETECTED NOT DETECTED Final   Listeria monocytogenes NOT DETECTED NOT DETECTED Final   Staphylococcus species DETECTED (A) NOT DETECTED Final    Comment: CRITICAL RESULT CALLED TO, READ BACK BY AND VERIFIED WITH: G. ABBOTT PHARMD, AT 0981 06/29/18 BY D. VANHOOK    Staphylococcus aureus DETECTED (A) NOT DETECTED Final    Comment: Methicillin (oxacillin) susceptible Staphylococcus aureus (MSSA). Preferred therapy is anti staphylococcal beta lactam antibiotic (Cefazolin or Nafcillin), unless clinically contraindicated. CRITICAL RESULT CALLED TO, READ BACK BY AND VERIFIED WITH: G. ABBOTT PHARMD, AT 0724 06/29/18 BY D. VANHOOK    Methicillin resistance NOT DETECTED NOT DETECTED Final   Streptococcus species NOT DETECTED NOT DETECTED Final   Streptococcus agalactiae NOT DETECTED NOT DETECTED Final   Streptococcus pneumoniae NOT DETECTED NOT DETECTED Final   Streptococcus pyogenes NOT DETECTED NOT DETECTED Final   Acinetobacter baumannii NOT DETECTED NOT DETECTED Final   Enterobacteriaceae species NOT DETECTED NOT DETECTED Final   Enterobacter cloacae complex NOT DETECTED NOT DETECTED Final    Escherichia coli NOT DETECTED NOT DETECTED Final   Klebsiella oxytoca NOT DETECTED NOT DETECTED  Final   Klebsiella pneumoniae NOT DETECTED NOT DETECTED Final   Proteus species NOT DETECTED NOT DETECTED Final   Serratia marcescens NOT DETECTED NOT DETECTED Final   Haemophilus influenzae NOT DETECTED NOT DETECTED Final   Neisseria meningitidis NOT DETECTED NOT DETECTED Final   Pseudomonas aeruginosa NOT DETECTED NOT DETECTED Final   Candida albicans NOT DETECTED NOT DETECTED Final   Candida glabrata NOT DETECTED NOT DETECTED Final   Candida krusei NOT DETECTED NOT DETECTED Final   Candida parapsilosis NOT DETECTED NOT DETECTED Final   Candida tropicalis NOT DETECTED NOT DETECTED Final    Comment: Performed at [facility], [address] [place], [place] [address]  Aerobic/Anaerobic Culture (surgical/deep wound)     Status: None   Collection Time: 06/28/18  5:01 PM  Result Value Ref Range Status   Specimen Description ABSCESS LUMBAR  Final   Special Requests NONE  Final   Gram Stain   Final    ABUNDANT WBC PRESENT, PREDOMINANTLY PMN ABUNDANT GRAM POSITIVE COCCI IN PAIRS IN CLUSTERS    Culture   Final    MODERATE STAPHYLOCOCCUS AUREUS NO ANAEROBES ISOLATED Performed at [facility], [address] [place], [place] [address]    Report Status 07/04/2018 FINAL  Final   Organism ID, Bacteria STAPHYLOCOCCUS AUREUS  Final      Susceptibility   Staphylococcus aureus - MIC*    CIPROFLOXACIN <=0.5 SENSITIVE Sensitive     ERYTHROMYCIN <=0.25 SENSITIVE Sensitive     GENTAMICIN <=0.5 SENSITIVE Sensitive     OXACILLIN <=0.25 SENSITIVE Sensitive     TETRACYCLINE <=1 SENSITIVE Sensitive     VANCOMYCIN <=0.5 SENSITIVE Sensitive     TRIMETH/SULFA <=10 SENSITIVE Sensitive     CLINDAMYCIN <=0.25 SENSITIVE Sensitive     RIFAMPIN <=0.5 SENSITIVE Sensitive     Inducible Clindamycin NEGATIVE Sensitive     * MODERATE STAPHYLOCOCCUS AUREUS  MRSA PCR Screening     Status: None    Collection Time: 06/29/18  4:14 PM  Result Value Ref Range Status   MRSA by PCR NEGATIVE NEGATIVE Final    Comment:        The GeneXpert MRSA Assay (FDA approved for NASAL specimens only), is one component of a comprehensive MRSA colonization surveillance program. It is not intended to diagnose MRSA infection nor to guide or monitor treatment for MRSA infections. Performed at [facility], [address] [place], [place] [address]   Aerobic/Anaerobic Culture (surgical/deep wound)     Status: None   Collection Time: 06/29/18  6:08 PM  Result Value Ref Range Status   Specimen Description FLUID LEFT KNEE  Final   Special Requests NONE  Final   Gram Stain   Final    ABUNDANT WBC PRESENT, PREDOMINANTLY PMN RARE GRAM POSITIVE COCCI RESULT CALLED TO, READ BACK BY AND VERIFIED WITH: DR Otelia SergeantNITKA 06/29/18 2012    Culture   Final    RARE STAPHYLOCOCCUS AUREUS NO ANAEROBES ISOLATED Performed at [facility], [address] [place], [place] [address]    Report Status 07/04/2018 FINAL  Final   Organism ID, Bacteria STAPHYLOCOCCUS AUREUS  Final      Susceptibility   Staphylococcus aureus - MIC*    CIPROFLOXACIN <=0.5 SENSITIVE Sensitive     ERYTHROMYCIN <=0.25 SENSITIVE Sensitive     GENTAMICIN <=0.5 SENSITIVE Sensitive     OXACILLIN 0.5 SENSITIVE Sensitive     TETRACYCLINE <=1 SENSITIVE Sensitive     VANCOMYCIN <=0.5 SENSITIVE Sensitive  TRIMETH/SULFA <=10 SENSITIVE Sensitive     CLINDAMYCIN <=0.25 SENSITIVE Sensitive     RIFAMPIN <=0.5 SENSITIVE Sensitive     Inducible Clindamycin NEGATIVE Sensitive     * RARE STAPHYLOCOCCUS AUREUS  Aerobic/Anaerobic Culture (surgical/deep wound)     Status: None   Collection Time: 06/29/18  7:31 PM  Result Value Ref Range Status   Specimen Description ABSCESS  Final   Special Requests LUMBAR ABSCESS  Final   Gram Stain   Final    ABUNDANT WBC PRESENT,BOTH PMN AND MONONUCLEAR ABUNDANT GRAM POSITIVE COCCI    Culture   Final     ABUNDANT STAPHYLOCOCCUS AUREUS NO ANAEROBES ISOLATED Performed at [facility], [address], [place], [place] [address]    Report Status 07/04/2018 FINAL  Final   Organism ID, Bacteria STAPHYLOCOCCUS AUREUS  Final      Susceptibility   Staphylococcus aureus - MIC*    CIPROFLOXACIN <=0.5 SENSITIVE Sensitive     ERYTHROMYCIN <=0.25 SENSITIVE Sensitive     GENTAMICIN <=0.5 SENSITIVE Sensitive     OXACILLIN 0.5 SENSITIVE Sensitive     TETRACYCLINE <=1 SENSITIVE Sensitive     VANCOMYCIN 1 SENSITIVE Sensitive     TRIMETH/SULFA <=10 SENSITIVE Sensitive     CLINDAMYCIN <=0.25 SENSITIVE Sensitive     RIFAMPIN <=0.5 SENSITIVE Sensitive     Inducible Clindamycin NEGATIVE Sensitive     * ABUNDANT STAPHYLOCOCCUS AUREUS  Culture, blood (routine x 2)     Status: None   Collection Time: 06/30/18  4:52 AM  Result Value Ref Range Status   Specimen Description BLOOD LEFT ARM  Final   Special Requests   Final    BOTTLES DRAWN AEROBIC AND ANAEROBIC Blood Culture adequate volume   Culture   Final    NO GROWTH 5 DAYS Performed at [facility], [address] [place], [place] [address]    Report Status 07/05/2018 FINAL  Final  Culture, blood (routine x 2)     Status: None   Collection Time: 06/30/18  4:55 AM  Result Value Ref Range Status   Specimen Description BLOOD LEFT WRIST  Final   Special Requests   Final    BOTTLES DRAWN AEROBIC AND ANAEROBIC Blood Culture adequate volume   Culture   Final    NO GROWTH 5 DAYS Performed at [facility], [address], [place], [place] [address]    Report Status 07/05/2018 FINAL  Final     Studies: No results found.  Scheduled Meds: . acetaminophen  1,000 mg Oral Q6H  . bisacodyl  10 mg Rectal Q3 days  . calcium-vitamin D  1 tablet Oral Daily  . docusate sodium  100 mg Oral BID  . DULoxetine  60 mg Oral Daily  . enoxaparin (LOVENOX) injection  30 mg Subcutaneous Q24H  . feeding supplement (ENSURE ENLIVE)  237 mL Oral TID BM    . ferrous gluconate  324 mg Oral TID WC  . multivitamin with minerals  1 tablet Oral Daily  . pantoprazole  40 mg Oral BID AC  . senna-docusate  1 tablet Oral BID  . sodium chloride flush  3 mL Intravenous Q12H  . tamsulosin  0.4 mg Oral Daily  . vitamin B-12  1,000 mcg Oral Daily    Continuous Infusions: . sodium chloride    .  ceFAZolin (ANCEF) IV 2 g (07/07/18 1407)  . methocarbamol (ROBAXIN) IV       Time spent: 25 mins,  I have  personally reviewed and interpreted on  07/07/2018 daily labs, imagings as discussed above under date review session and assessment and plans.  I reviewed all nursing notes, pharmacy notes, consultant notes,  vitals, pertinent old records  I have discussed plan of care as described above with RN , patient  on 07/07/2018   Albertine Grates MD, PhD  [facility] Pager [phone]. If 7PM-7AM, please contact night-coverage at [email or web address], password [facility] 07/07/2018, 3:22 PM  LOS: 9 days

## 2018-07-08 ENCOUNTER — Telehealth (INDEPENDENT_AMBULATORY_CARE_PROVIDER_SITE_OTHER): Payer: Self-pay | Admitting: *Deleted

## 2018-07-08 ENCOUNTER — Inpatient Hospital Stay: Payer: Self-pay

## 2018-07-08 ENCOUNTER — Inpatient Hospital Stay (HOSPITAL_COMMUNITY): Payer: No Typology Code available for payment source

## 2018-07-08 DIAGNOSIS — G959 Disease of spinal cord, unspecified: Secondary | ICD-10-CM

## 2018-07-08 LAB — BASIC METABOLIC PANEL
ANION GAP: 8 (ref 5–15)
BUN: 12 mg/dL (ref 6–20)
CALCIUM: 7.7 mg/dL — AB (ref 8.9–10.3)
CHLORIDE: 105 mmol/L (ref 98–111)
CO2: 26 mmol/L (ref 22–32)
CREATININE: 0.87 mg/dL (ref 0.61–1.24)
GFR calc non Af Amer: >60 mL/min (ref >60)
GLUCOSE: 105 mg/dL — AB (ref 70–99)
Potassium: 4 mmol/L (ref 3.5–5.1)
Sodium: 139 mmol/L (ref 135–145)

## 2018-07-08 LAB — CBC WITH DIFFERENTIAL/PLATELET
BASOS PCT: 0 %
Basophils Absolute: 0 10*3/uL (ref 0.0–0.1)
EOS PCT: 1 %
Eosinophils Absolute: 0.2 10*3/uL (ref 0.0–0.7)
HEMATOCRIT: 25.5 % — AB (ref 39.0–52.0)
HEMOGLOBIN: 8.1 g/dL — AB (ref 13.0–17.0)
LYMPHS ABS: 1.5 10*3/uL (ref 0.7–4.0)
Lymphocytes Relative: 9 %
MCH: 27.8 pg (ref 26.0–34.0)
MCHC: 31.8 g/dL (ref 30.0–36.0)
MCV: 87.6 fL (ref 78.0–100.0)
MONO ABS: 1.3 10*3/uL — AB (ref 0.1–1.0)
MONOS PCT: 8 %
Neutro Abs: 13.2 10*3/uL — ABNORMAL HIGH (ref 1.7–7.7)
Neutrophils Relative %: 82 %
Platelets: 339 10*3/uL (ref 150–400)
RBC: 2.91 MIL/uL — AB (ref 4.22–5.81)
RDW: 15.5 % (ref 11.5–15.5)
WBC: 16.2 10*3/uL — ABNORMAL HIGH (ref 4.0–10.5)

## 2018-07-08 LAB — MAGNESIUM: Magnesium: 1.8 mg/dL (ref 1.7–2.4)

## 2018-07-08 MED ORDER — SODIUM CHLORIDE 0.9% FLUSH: 10.0000 mL | INTRAVENOUS | Status: DC | PRN

## 2018-07-08 MED ORDER — IOHEXOL 300 MG/ML  SOLN
75.0000 mL | Freq: Once | INTRAMUSCULAR | Status: AC | PRN
  Administered 2018-07-08: 75 mL via INTRAVENOUS

## 2018-07-08 NOTE — Plan of Care (Signed)

## 2018-07-08 NOTE — Progress Notes (Signed)
Peripherally Inserted Central Catheter/Midline Placement  The IV Nurse has discussed with the patient and/or persons authorized to consent for the patient, the purpose of this procedure and the potential benefits and risks involved with this procedure.  The benefits include less needle sticks, lab draws from the catheter, and the patient may be discharged home with the catheter. Risks include, but not limited to, infection, bleeding, blood clot (thrombus formation), and puncture of an artery; nerve damage and irregular heartbeat and possibility to perform a PICC exchange if needed/ordered by physician.  Alternatives to this procedure were also discussed.  Bard Power PICC patient education guide, fact sheet on infection prevention and patient information card has been provided to patient /or left at bedside.    PICC/Midline Placement Documentation        [name] [name] 07/08/2018, 6:10 PM

## 2018-07-08 NOTE — Progress Notes (Signed)
Physical Therapy Treatment Patient Details Name: [name] [name] MRN: [id number] DOB: 1956-06-25 Today's Date: 07/08/2018    History of Present Illness [name] [name] is a 62 y.o. male admitted secondary to profound bilateral LE weakness. Pt with multiple diagnoses including thoracic myelopathy, flaccid paralysic both legs, left cervical radiculopathy, acute spinal cord injury; Pt is now s/p LUMBAR WOUND DEBRIDEMENT DRAINAGE AND IRRIGATION; AND ASPIRATION OF LEFT KNEE ARTHROSCOPY KNEE (Left).    PT Comments    Pt on arrival after first bout of rolling presents with bowel incontinence which significantly changed direction of tx.  Deferred transfer training and focused on bed mobility and rolling to clean patient of bowel incontinence and place maximove sling for OOB transfer to focus on WC mobility.  Pt able to perform short bouts of WC mobility with use of B UEs and LLE but fatigues quickly and required several rest breaks.  Pt remains motivated and eager to progress with each session and continues to be an excellent candidate for CIR therapies to achieve improvement in function and decrease caregiver burden before returning home.   Follow Up Recommendations  Supervision/Assistance - 24 hour;CIR     Equipment Recommendations  None recommended by PT    Recommendations for Other Services       Precautions / Restrictions Precautions Precautions: Back;Fall Precaution Booklet Issued: No Precaution Comments: wound VAC; Restrictions Weight Bearing Restrictions: No    Mobility  Bed Mobility Overal bed mobility: Needs Assistance Bed Mobility: Rolling Rolling: Max assist;+2 for physical assistance(performed multiple reps of rolling to R and to L for pericare.  Rolling performed to R side and upon rolling noticed large loose BM.  This required multiple reps of rolling to clean patient.  )         General bed mobility comments: Pt required cues for hand placement, and foot placement to achieve  rolling.  Bed pad used for rolling assistance.  After multiple reps of rolling for pericare and placement of pad, opted for maxi move lift to focus on WC mobility.    Transfers                 General transfer comment: Deffered lateral scoot and sit to stand transfers based on increased time needed to perform pericare.  Will progress transfers next session, focused on OOB mobility and progression to [facility] for locomotion.    Ambulation/Gait Ambulation/Gait assistance: (NT)           General Gait Details: unable   Psychologist, counselling mobility: Yes Wheelchair propulsion: Both upper extremities;Left lower extremity Distance: Pt able to perform 10-15 ft bouts before requiring a 30 sec rest break between each bout Wheelchair Assistance Details (indicate cue type and reason): Pt required min to moderate assistance based on level of fatigue.    Modified Rankin (Stroke Patients Only)       Balance Overall balance assessment: Needs assistance Sitting-balance support: Feet supported;Single extremity supported;Bilateral upper extremity supported Sitting balance-Leahy Scale: Poor Sitting balance - Comments: Poor but progresses to fair with back support and B foot support.         Standing balance comment: NT today.  But likely remains poor.                              Cognition Arousal/Alertness: Awake/alert Behavior During Therapy: WFL for tasks assessed/performed Overall Cognitive Status:  Within Functional Limits for tasks assessed                                 General Comments: Pt very motivated continues to work hard with therapy.      Exercises      General Comments        Pertinent Vitals/Pain Pain Assessment: 0-10 Pain Score: 5  Pain Location: back and L knee Pain Descriptors / Indicators: Grimacing;Sore;Guarding Pain Intervention(s): Monitored during session;Repositioned     Home Living                      Prior Function            PT Goals (current goals can now be found in the care plan section) Acute Rehab PT Goals Patient Stated Goal: to walk in the parallel bars at OP PT Potential to Achieve Goals: Good Additional Goals Additional Goal #1: Pt will be able to negotiate wheelchair with supervision for >125 feet Progress towards PT goals: Progressing toward goals    Frequency    Min 4X/week      PT Plan Current plan remains appropriate    Co-evaluation PT/OT/SLP Co-Evaluation/Treatment: Yes Reason for Co-Treatment: Complexity of the patient's impairments (multi-system involvement);For patient/therapist safety;To address functional/ADL transfers;Necessary to address cognition/behavior during functional activity          AM-PAC PT "6 Clicks" Daily Activity  Outcome Measure  Difficulty turning over in bed (including adjusting bedclothes, sheets and blankets)?: Unable Difficulty moving from lying on back to sitting on the side of the bed? : Unable Difficulty sitting down on and standing up from a chair with arms (e.g., wheelchair, bedside commode, etc,.)?: Unable Help needed moving to and from a bed to chair (including a wheelchair)?: A Lot Help needed walking in hospital room?: Total Help needed climbing 3-5 steps with a railing? : Total 6 Click Score: 7    End of Session   Activity Tolerance: Patient tolerated treatment well(Tx limited due to bowel incontinence on arrival.  ) Patient left: in chair;with call bell/phone within reach;with family/visitor present Nurse Communication: Mobility status;Need for lift equipment(maximove placed under patient in WC.  ) PT Visit Diagnosis: Other abnormalities of gait and mobility (R26.89);Pain Pain - Right/Left: Left Pain - part of body: Knee(and back )     Time: 1112-1212 PT Time Calculation (min) (ACUTE ONLY): 60 min  Charges:  $Therapeutic Activity: 8-22 mins $Wheel Chair  Management: 8-22 mins                     Joycelyn Rua, PTA Acute Rehabilitation Services Pager [phone] Office [phone]     Kasidee Voisin Artis Delay 07/08/2018, 1:26 PM

## 2018-07-08 NOTE — Progress Notes (Signed)
PROGRESS NOTE    [name] [name]  NWG:[id number] DOB: May 28, 1956 DOA: 06/27/2018 PCP: Renford Dills, MD    Brief Narrative:  62 year old male who was admitted September 15 with worsening weakness of his lower extremities, difficulty ambulating associated with lumbosacral pain, refractive to outpatient therapy.  Patient was diagnosed with L4, L5 epidural abscess and underwent IR drainage.  His blood cultures turned positive for MSSA.  He was noted to have worsening kidney function, and hospital medicine was consulted.  At the time of the consultation blood pressure 119/80, heart rate 92, respiratory rate 18, temperature 98.4, oxygen saturation 97%, lungs are clear to auscultation, heart S1-S2 present and rhythmic, abdomen protuberant, positive nonpitting lower extremity edema.  Sodium 141, potassium 3.6, chloride 1 7, bicarb 24, glucose 187, BUN 42, creatinine 2.12.    Assessment & Plan:   Principal Problem:   Spondylosis, thoracic, with myelopathy Active Problems:   Thoracic myelopathy   Abscess in epidural space of L2-L5 lumbar spine   Acute urinary retention   Left cervical radiculopathy   Leukocytosis   Postoperative seroma involving nervous system after nervous system procedure   Shoulder pain, bilateral   Septic arthritis of knee, left (HCC)   Complete tear of left rotator cuff   AKI (acute kidney injury) (HCC)   MSSA bacteremia   Abscess in epidural space of lumbar spine   Effusion, left knee   Pain and swelling of wrist, left   1. Sepsis due to MSSA bacteremia and lumbar epidural abscess (rained 09.16). Will continue cefazolin IV, patient has remained afebrile, wbc at 16 from 20. Repeat blood cultures from 09/17 with no growth. TEE negative for vegetations. Will continue ID recommendations. Place PICC line.   2. Left Knee septic arthritis. Continue antibiotic therapy with Anxcef IV, pain control, out of bed as tolerated and physical therapy.   3. Cervical and thoracic  myelopathy. Continue physical therapy evaluation.   4. Anemia of chronic disease. Hb and hct have been stable, will continue to follow on cell count.   5. AKI. Clinically has resolved, serum cr down to 0,87 with K at 4.0 and serum bicarbonate at 26. Patient tolerating po well, avoid hypotension or nephrotoxic medications.   6. Morbid obesity. Will need outpatient follow up. Continue nutritional supplements.    DVT prophylaxis: enoxaparin   Code Status:  full Family Communication: I spoke with patient's wife at the bedside and all questions were addressed.  Disposition Plan/ discharge barriers: CIR    Consultants:   ID  Procedures:     Antimicrobials:       Subjective: Patient is feeling better, knee pain is stable, no nausea or vomiting, no chest pain or dyspnea. Continue to be very weak and deconditioned.   Objective: Vitals:   07/07/18 2040 07/07/18 2042 07/08/18 0452 07/08/18 1252  BP: (!) 91/48 (!) 119/51 (!) 153/86 113/76  Pulse: (!) 103 (!) 104 91 (!) 111  Resp: 20  17   Temp: (!) 100.8 F (38.2 C)  98.7 F (37.1 C) (!) 100.4 F (38 C)  TempSrc: Oral  Oral Oral  SpO2: 100%  99% (!) 88%  Weight:      Height:        Intake/Output Summary (Last 24 hours) at 07/08/2018 1413 Last data filed at 07/08/2018 1030 Gross per 24 hour  Intake 720 ml  Output 1000 ml  Net -280 ml   Filed Weights   06/28/18 1424  Weight: (!) 147.8 kg    Examination:  General: deconditioned  Neurology: Awake and alert, non focal  E ENT: mild pallor, no icterus, oral mucosa moist Cardiovascular: No JVD. S1-S2 present, rhythmic, no gallops, rubs, or murmurs. No lower extremity edema. Pulmonary: positive breath sounds bilaterally, adequate air movement, no wheezing, rhonchi or rales. Gastrointestinal. Abdomen protuberant, no organomegaly, non tender, no rebound or guarding Skin. Wound vac at the right flank.  Musculoskeletal: no joint deformities     Data Reviewed: I have  personally reviewed following labs and imaging studies  CBC: Recent Labs  Lab 07/03/18 0425 07/04/18 0843 07/05/18 0409 07/06/18 0540 07/07/18 0448 07/08/18 0416  WBC 14.9* 23.8* 24.9* 26.8* 20.1* 16.2*  NEUTROABS 13.2*  --  21.4* 23.6* 16.9* 13.2*  HGB 11.1* 10.1* 10.2* 9.2* 8.4* 8.1*  HCT 34.7* 31.0* 31.8* 28.9* 26.4* 25.5*  MCV 86.3 85.6 87.1 86.8 87.1 87.6  PLT 115* 150 195 226 284 339   Basic Metabolic Panel: Recent Labs  Lab 07/04/18 0843 07/05/18 0409 07/06/18 0540 07/07/18 0448 07/08/18 0416  NA 142 144 142 140 139  K 4.1 3.9 4.2 3.9 4.0  CL 110 110 108 106 105  CO2 25 27 28 26 26   GLUCOSE 120* 126* 118* 104* 105*  BUN 20 15 11 11 12   CREATININE 0.87 0.82 0.86 0.80 0.87  CALCIUM 8.0* 8.0* 7.8* 7.9* 7.7*  MG  --   --  1.9  --  1.8   GFR: Estimated Creatinine Clearance: 131.4 mL/min (by C-G formula based on SCr of 0.87 mg/dL). Liver Function Tests: Recent Labs  Lab 07/02/18 0318 07/02/18 0701 07/03/18 0425  AST QUANTITY NOT SUFFICIENT, UNABLE TO PERFORM TEST 55* 32  ALT QUANTITY NOT SUFFICIENT, UNABLE TO PERFORM TEST 16 11  ALKPHOS 102  --  114  BILITOT QUANTITY NOT SUFFICIENT, UNABLE TO PERFORM TEST 0.7 0.9  PROT 5.6*  --  5.9*  ALBUMIN 1.9*  --  2.0*   No results for input(s): LIPASE, AMYLASE in the last 168 hours. No results for input(s): AMMONIA in the last 168 hours. Coagulation Profile: No results for input(s): INR, PROTIME in the last 168 hours. Cardiac Enzymes: Recent Labs  Lab 07/01/18 1651  CKTOTAL 89   BNP (last 3 results) No results for input(s): PROBNP in the last 8760 hours. HbA1C: No results for input(s): HGBA1C in the last 72 hours. CBG: No results for input(s): GLUCAP in the last 168 hours. Lipid Profile: No results for input(s): CHOL, HDL, LDLCALC, TRIG, CHOLHDL, LDLDIRECT in the last 72 hours. Thyroid Function Tests: No results for input(s): TSH, T4TOTAL, FREET4, T3FREE, THYROIDAB in the last 72 hours. Anemia  Panel: Recent Labs    07/06/18 0952  RETICCTPCT 1.1      Radiology Studies: I have reviewed all of the imaging during this hospital visit personally     Scheduled Meds: . acetaminophen  1,000 mg Oral Q6H  . bisacodyl  10 mg Rectal Q3 days  . calcium-vitamin D  1 tablet Oral Daily  . docusate sodium  100 mg Oral BID  . DULoxetine  60 mg Oral Daily  . enoxaparin (LOVENOX) injection  30 mg Subcutaneous Q24H  . feeding supplement (ENSURE ENLIVE)  237 mL Oral TID BM  . ferrous gluconate  324 mg Oral TID WC  . multivitamin with minerals  1 tablet Oral Daily  . pantoprazole  40 mg Oral BID AC  . senna-docusate  1 tablet Oral BID  . sodium chloride flush  3 mL Intravenous Q12H  . tamsulosin  0.4 mg  Oral Daily  . vitamin B-12  1,000 mcg Oral Daily   Continuous Infusions: . sodium chloride    .  ceFAZolin (ANCEF) IV 2 g (07/08/18 0549)  . methocarbamol (ROBAXIN) IV       LOS: 10 days        Jaynie Hitch Annett Gula, MD [facility] Pager [phone]
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
## 2018-07-08 NOTE — Telephone Encounter (Signed)
Mellissa with [facility] inpt rehab called wanting to know when will pt be ready for inpatient rehab and also when he is ready physician needs to document in chart that pt is ready for inpatient rehab for insurance purposes.   Pt is in 5N07  Please call Melissa at [phone] 

## 2018-07-08 NOTE — Plan of Care (Signed)
  Problem: Education: Goal: Knowledge of General Education information will improve Description: Including pain rating scale, medication(s)/side effects and non-pharmacologic comfort measures Outcome: Progressing   Problem: Health Behavior/Discharge Planning: Goal: Ability to manage health-related needs will improve Outcome: Progressing   Problem: Clinical Measurements: Goal: Ability to maintain clinical measurements within normal limits will improve Outcome: Progressing   Problem: Activity: Goal: Risk for activity intolerance will decrease Outcome: Progressing   Problem: Nutrition: Goal: Adequate nutrition will be maintained Outcome: Progressing   Problem: Coping: Goal: Level of anxiety will decrease Outcome: Progressing   Problem: Elimination: Goal: Will not experience complications related to bowel motility Outcome: Progressing Goal: Will not experience complications related to urinary retention Outcome: Progressing   Problem: Safety: Goal: Ability to remain free from injury will improve Outcome: Progressing   Problem: Skin Integrity: Goal: Risk for impaired skin integrity will decrease Outcome: Progressing   

## 2018-07-08 NOTE — Telephone Encounter (Signed)
Mellissa with [facility] inpt rehab called wanting to know when will pt be ready for inpatient rehab and also when he is ready physician needs to document in chart that pt is ready for inpatient rehab for insurance purposes.   Pt is in 5N07  Please call Melissa at [phone]

## 2018-07-08 NOTE — Consult Note (Addendum)
WOC Nurse wound follow up Wound type:Surgical Measurement:per Monday, 10cm x 5cm x 5cm Wound bed: Red, moist, bleeds easily, but stops much more quickly than it did on Monday Drainage (amount, consistency, odor) serosanguinous in tubing Periwound:intact, dry Dressing procedure/placement/frequency: Dr. Otelia Sergeant in room at the time of my visit. Two pieces of foam removed from defect, one as foam bridge.Wound rinsed with NS, patted gently dry. Periwound skin protected with drape, also where I was going to place foam bridge.  Two pieces of foam used to fill defect due to depth, one used as bridge to left hip for total of 3 pieces black foam.  Drape applied to seal and an immediate seal achieved at continuous negative pressure.  Next dressing change is due on Friday, 9/27.  Patient anticipating discharge to inpatient Rehab. Supplies in room.  WOC nursing team will follow while in house for 3xW dressing changes on M/W/F, and will remain available to this patient, the nursing and medical teams.   Thanks, Ladona Mow, MSN, RN, GNP, [place]  Pager# [phone]

## 2018-07-08 NOTE — Progress Notes (Signed)
Occupational Therapy Treatment Patient Details Name: [name] [name] MRN: [id number] DOB: 1955-11-28 Today's Date: 07/08/2018    History of present illness [name] [name] is a 62 y.o. male admitted secondary to profound bilateral LE weakness. Pt with multiple diagnoses including thoracic myelopathy, flaccid paralysic both legs, left cervical radiculopathy, acute spinal cord injury; Pt is now s/p LUMBAR WOUND DEBRIDEMENT DRAINAGE AND IRRIGATION; AND ASPIRATION OF LEFT KNEE ARTHROSCOPY KNEE (Left).   OT comments  Pt progressing towards OT goals this session. Pt continues to be so positive, so motivated, and determined to maximize independence. Pt about to assist with rolling in the bed for significant peri care clean up multiple times noting good use of log roll, using legs to assist. Pt then rolling again for maximove placement for OOB to wheelchair. Pt noted to be so thankful and happy upright and mobile in wheelchair. Pt able to perform seated grooming in wc at set up level. Current POC remains appropriate and Pt is hopeful for CIR level therapy.   Follow Up Recommendations  CIR;Supervision/Assistance - 24 hour    Equipment Recommendations  Other (comment)(defer to next venue)    Recommendations for Other Services      Precautions / Restrictions Precautions Precautions: Back;Fall Precaution Booklet Issued: No Precaution Comments: wound VAC; Restrictions Weight Bearing Restrictions: No       Mobility Bed Mobility Overal bed mobility: Needs Assistance Bed Mobility: Rolling Rolling: Max assist;+2 for physical assistance(performed multiple reps of rolling to R and to L for pericar)         General bed mobility comments: Pt required cues for hand placement, and foot placement to achieve rolling.  Bed pad used for rolling assistance.  After multiple reps of rolling for pericare and placement of pad, opted for maxi move lift to focus on WC mobility.    Transfers Overall transfer level:  Needs assistance               General transfer comment: Deffered lateral scoot and sit to stand transfers based on increased time needed to perform pericare.  Will progress transfers next session, focused on OOB mobility and progression to [facility] for locomotion.      Balance Overall balance assessment: Needs assistance Sitting-balance support: Feet supported;Single extremity supported;Bilateral upper extremity supported Sitting balance-Leahy Scale: Poor Sitting balance - Comments: Poor but progresses to fair with back support and B foot support.         Standing balance comment: NT today.  But likely remains poor.                             ADL either performed or assessed with clinical judgement   ADL Overall ADL's : Needs assistance/impaired     Grooming: Wash/dry hands;Wash/dry face;Set up;Sitting                   Toilet Transfer: Total assistance;+2 for physical assistance;+2 for safety/equipment;Requires wide/bariatric Toilet Transfer Details (indicate cue type and reason): drop arm bariatric BSC will be necessary. Toileting- Clothing Manipulation and Hygiene: Total assistance;+2 for physical assistance               Vision       Perception     Praxis      Cognition Arousal/Alertness: Awake/alert Behavior During Therapy: WFL for tasks assessed/performed Overall Cognitive Status: Within Functional Limits for tasks assessed  General Comments: Pt very motivated continues to work hard with therapy.        Exercises     Shoulder Instructions       General Comments      Pertinent Vitals/ Pain       Pain Assessment: Faces Pain Score: 5  Faces Pain Scale: Hurts a little bit Pain Location: back Pain Descriptors / Indicators: Grimacing;Sore;Guarding Pain Intervention(s): Monitored during session;Repositioned  Home Living                                          Prior  Functioning/Environment Level of Independence: Independent with assistive device(s)  Gait / Transfers Assistance Needed: amb with rolling walker. working with [employer] ADL's / Homemaking Assistance Needed: reports independent-Mod I        Frequency  Min 2X/week        Progress Toward Goals  OT Goals(current goals can now be found in the care plan section)  Progress towards OT goals: Progressing toward goals  Acute Rehab OT Goals Patient Stated Goal: to walk in the parallel bars at OP PT OT Goal Formulation: With patient Time For Goal Achievement: 07/16/18 Potential to Achieve Goals: Good  Plan Discharge plan remains appropriate;Frequency remains appropriate    Co-evaluation    PT/OT/SLP Co-Evaluation/Treatment: Yes Reason for Co-Treatment: Complexity of the patient's impairments (multi-system involvement);For patient/therapist safety;To address functional/ADL transfers PT goals addressed during session: Mobility/safety with mobility;Balance;Proper use of DME;Strengthening/ROM OT goals addressed during session: ADL's and self-care;Proper use of Adaptive equipment and DME;Strengthening/ROM      AM-PAC PT "6 Clicks" Daily Activity     Outcome Measure   Help from another person eating meals?: None Help from another person taking care of personal grooming?: None Help from another person toileting, which includes using toliet, bedpan, or urinal?: Total Help from another person bathing (including washing, rinsing, drying)?: A Lot Help from another person to put on and taking off regular upper body clothing?: A Lot Help from another person to put on and taking off regular lower body clothing?: Total 6 Click Score: 14    End of Session    OT Visit Diagnosis: Unsteadiness on feet (R26.81);Other abnormalities of gait and mobility (R26.89);Muscle weakness (generalized) (M62.81);History of falling (Z91.81);Pain Pain - Right/Left: Left Pain - part of body: Shoulder;Knee   Activity  Tolerance Patient tolerated treatment well   Patient Left Other (comment)(in WC)   Nurse Communication Mobility status;Need for lift equipment        Time: 1610-9604 OT Time Calculation (min): 44 min  Charges: OT General Charges $OT Visit: 1 Visit OT Treatments $Self Care/Home Management : 8-22 mins $Therapeutic Activity: 8-22 mins  Sherryl Manges OTR/L Acute Rehabilitation Services Pager: [phone] Office: [phone]   Evern Bio Wayden Schwertner 07/08/2018, 4:44 PM

## 2018-07-08 NOTE — Progress Notes (Signed)
INFECTIOUS DISEASE PROGRESS NOTE  ID: [name] [name] is a 62 y.o. male with  Principal Problem:   Spondylosis, thoracic, with myelopathy Active Problems:   Thoracic myelopathy   Abscess in epidural space of L2-L5 lumbar spine   Acute urinary retention   Left cervical radiculopathy   Leukocytosis   Postoperative seroma involving nervous system after nervous system procedure   Shoulder pain, bilateral   Septic arthritis of knee, left (HCC)   Complete tear of left rotator cuff   AKI (acute kidney injury) (HCC)   MSSA bacteremia   Abscess in epidural space of lumbar spine   Effusion, left knee   Pain and swelling of wrist, left  Subjective: No complaints  Abtx:  Anti-infectives (From admission, onward)   Start     Dose/Rate Route Frequency Ordered Stop   06/29/18 1936  vancomycin (VANCOCIN) powder  Status:  Discontinued       As needed 06/29/18 1936 06/29/18 2121   06/29/18 1928  polymyxin B 500,000 Units, bacitracin 50,000 Units in sodium chloride 0.9 % 500 mL irrigation  Status:  Discontinued       As needed 06/29/18 1928 06/29/18 2121   06/29/18 1800  vancomycin (VANCOCIN) IVPB 1000 mg/200 mL premix  Status:  Discontinued     1,000 mg 200 mL/hr over 60 Minutes Intravenous  Once 06/29/18 0032 06/29/18 1042   06/29/18 1400  ceFAZolin (ANCEF) IVPB 2g/100 mL premix     2 g 200 mL/hr over 30 Minutes Intravenous Every 8 hours 06/29/18 1042     06/29/18 0200  ceFEPIme (MAXIPIME) 2 g in sodium chloride 0.9 % 100 mL IVPB  Status:  Discontinued     2 g 200 mL/hr over 30 Minutes Intravenous Every 8 hours 06/29/18 0031 06/29/18 1042   06/29/18 0130  vancomycin (VANCOCIN) 2,000 mg in sodium chloride 0.9 % 500 mL IVPB     2,000 mg 250 mL/hr over 120 Minutes Intravenous  Once 06/29/18 0032 06/29/18 0257      Medications:  Scheduled: . acetaminophen  1,000 mg Oral Q6H  . bisacodyl  10 mg Rectal Q3 days  . calcium-vitamin D  1 tablet Oral Daily  . docusate sodium  100 mg Oral BID    . DULoxetine  60 mg Oral Daily  . enoxaparin (LOVENOX) injection  30 mg Subcutaneous Q24H  . feeding supplement (ENSURE ENLIVE)  237 mL Oral TID BM  . ferrous gluconate  324 mg Oral TID WC  . multivitamin with minerals  1 tablet Oral Daily  . pantoprazole  40 mg Oral BID AC  . senna-docusate  1 tablet Oral BID  . sodium chloride flush  3 mL Intravenous Q12H  . tamsulosin  0.4 mg Oral Daily  . vitamin B-12  1,000 mcg Oral Daily    Objective: Vital signs in last 24 hours: Temp:  [98.7 F (37.1 C)-100.8 F (38.2 C)] 98.7 F (37.1 C) (09/25 0452) Pulse Rate:  [91-104] 91 (09/25 0452) Resp:  [17-22] 17 (09/25 0452) BP: (91-153)/(48-86) 153/86 (09/25 0452) SpO2:  [96 %-100 %] 99 % (09/25 0452)   General appearance: alert, cooperative and no distress Resp: clear to auscultation bilaterally Cardio: regular rate and rhythm GI: normal findings: bowel sounds normal and soft, non-tender and abnormal findings:  distended Extremities: decreased wrist swelling on R  Lab Results Recent Labs    07/07/18 0448 07/08/18 0416  WBC 20.1* 16.2*  HGB 8.4* 8.1*  HCT 26.4* 25.5*  NA 140 139  K 3.9  4.0  CL 106 105  CO2 26 26  BUN 11 12  CREATININE 0.80 0.87   Liver Panel No results for input(s): PROT, ALBUMIN, AST, ALT, ALKPHOS, BILITOT, BILIDIR, IBILI in the last 72 hours. Sedimentation Rate No results for input(s): ESRSEDRATE in the last 72 hours. C-Reactive Protein No results for input(s): CRP in the last 72 hours.  Microbiology: Recent Results (from the past 240 hour(s))  Culture, Urine     Status: Abnormal   Collection Time: 06/28/18 11:59 AM  Result Value Ref Range Status   Specimen Description URINE, CATHETERIZED  Final   Special Requests   Final    Normal Performed at [facility], [address] [place], [place] [address]    Culture MULTIPLE SPECIES PRESENT, SUGGEST RECOLLECTION (A)  Final   Report Status 06/29/2018 FINAL  Final  Culture, blood (routine x 2)      Status: Abnormal   Collection Time: 06/28/18  3:30 PM  Result Value Ref Range Status   Specimen Description BLOOD LEFT ANTECUBITAL  Final   Special Requests   Final    BOTTLES DRAWN AEROBIC AND ANAEROBIC Blood Culture adequate volume   Culture  Setup Time   Final    GRAM POSITIVE COCCI IN BOTH AEROBIC AND ANAEROBIC BOTTLES CRITICAL RESULT CALLED TO, READ BACK BY AND VERIFIED WITH: G. ABBOTT PHARMD, AT 0724 06/29/18 BY D. VANHOOK    Culture (A)  Final    STAPHYLOCOCCUS AUREUS SUSCEPTIBILITIES PERFORMED ON PREVIOUS CULTURE WITHIN THE LAST 5 DAYS. Performed at [facility], [address], [place], [place] [address]    Report Status 07/01/2018 FINAL  Final  Culture, blood (routine x 2)     Status: Abnormal   Collection Time: 06/28/18  3:35 PM  Result Value Ref Range Status   Specimen Description BLOOD LEFT HAND  Final   Special Requests   Final    BOTTLES DRAWN AEROBIC AND ANAEROBIC Blood Culture adequate volume   Culture  Setup Time   Final    GRAM POSITIVE COCCI IN BOTH AEROBIC AND ANAEROBIC BOTTLES CRITICAL RESULT CALLED TO, READ BACK BY AND VERIFIED WITH: G. ABBOTT PHARMD, AT 0724 06/29/18 BY D. VANHOOK    Culture (A)  Final    STAPHYLOCOCCUS AUREUS BACILLUS SPECIES Standardized susceptibility testing for this organism is not available. CRITICAL RESULT CALLED TO, READ BACK BY AND VERIFIED WITH: Patrick North PHARMD, AT 1519 06/30/18 BY D. VANHOOK REGARDING CULTURE GROWTH Performed at [facility], [address] [place], [place] [address]    Report Status 07/01/2018 FINAL  Final   Organism ID, Bacteria STAPHYLOCOCCUS AUREUS  Final      Susceptibility   Staphylococcus aureus - MIC*    CIPROFLOXACIN <=0.5 SENSITIVE Sensitive     ERYTHROMYCIN <=0.25 SENSITIVE Sensitive     GENTAMICIN <=0.5 SENSITIVE Sensitive     OXACILLIN 0.5 SENSITIVE Sensitive     TETRACYCLINE <=1 SENSITIVE Sensitive     VANCOMYCIN <=0.5 SENSITIVE Sensitive     TRIMETH/SULFA <=10 SENSITIVE  Sensitive     CLINDAMYCIN <=0.25 SENSITIVE Sensitive     RIFAMPIN <=0.5 SENSITIVE Sensitive     Inducible Clindamycin NEGATIVE Sensitive     * STAPHYLOCOCCUS AUREUS  Blood Culture ID Panel (Reflexed)     Status: Abnormal   Collection Time: 06/28/18  3:35 PM  Result Value Ref Range Status   Enterococcus species NOT DETECTED NOT DETECTED Final   Listeria monocytogenes NOT DETECTED NOT DETECTED Final   Staphylococcus  species DETECTED (A) NOT DETECTED Final    Comment: CRITICAL RESULT CALLED TO, READ BACK BY AND VERIFIED WITH: G. ABBOTT PHARMD, AT 0724 06/29/18 BY D. VANHOOK    Staphylococcus aureus DETECTED (A) NOT DETECTED Final    Comment: Methicillin (oxacillin) susceptible Staphylococcus aureus (MSSA). Preferred therapy is anti staphylococcal beta lactam antibiotic (Cefazolin or Nafcillin), unless clinically contraindicated. CRITICAL RESULT CALLED TO, READ BACK BY AND VERIFIED WITH: G. ABBOTT PHARMD, AT 0724 06/29/18 BY D. VANHOOK    Methicillin resistance NOT DETECTED NOT DETECTED Final   Streptococcus species NOT DETECTED NOT DETECTED Final   Streptococcus agalactiae NOT DETECTED NOT DETECTED Final   Streptococcus pneumoniae NOT DETECTED NOT DETECTED Final   Streptococcus pyogenes NOT DETECTED NOT DETECTED Final   Acinetobacter baumannii NOT DETECTED NOT DETECTED Final   Enterobacteriaceae species NOT DETECTED NOT DETECTED Final   Enterobacter cloacae complex NOT DETECTED NOT DETECTED Final   Escherichia coli NOT DETECTED NOT DETECTED Final   Klebsiella oxytoca NOT DETECTED NOT DETECTED Final   Klebsiella pneumoniae NOT DETECTED NOT DETECTED Final   Proteus species NOT DETECTED NOT DETECTED Final   Serratia marcescens NOT DETECTED NOT DETECTED Final   Haemophilus influenzae NOT DETECTED NOT DETECTED Final   Neisseria meningitidis NOT DETECTED NOT DETECTED Final   Pseudomonas aeruginosa NOT DETECTED NOT DETECTED Final   Candida albicans NOT DETECTED NOT DETECTED Final   Candida  glabrata NOT DETECTED NOT DETECTED Final   Candida krusei NOT DETECTED NOT DETECTED Final   Candida parapsilosis NOT DETECTED NOT DETECTED Final   Candida tropicalis NOT DETECTED NOT DETECTED Final    Comment: Performed at [facility], [address], [place], [place] [address]  Aerobic/Anaerobic Culture (surgical/deep wound)     Status: None   Collection Time: 06/28/18  5:01 PM  Result Value Ref Range Status   Specimen Description ABSCESS LUMBAR  Final   Special Requests NONE  Final   Gram Stain   Final    ABUNDANT WBC PRESENT, PREDOMINANTLY PMN ABUNDANT GRAM POSITIVE COCCI IN PAIRS IN CLUSTERS    Culture   Final    MODERATE STAPHYLOCOCCUS AUREUS NO ANAEROBES ISOLATED Performed at [facility], [address], [place], [place] [address]    Report Status 07/04/2018 FINAL  Final   Organism ID, Bacteria STAPHYLOCOCCUS AUREUS  Final      Susceptibility   Staphylococcus aureus - MIC*    CIPROFLOXACIN <=0.5 SENSITIVE Sensitive     ERYTHROMYCIN <=0.25 SENSITIVE Sensitive     GENTAMICIN <=0.5 SENSITIVE Sensitive     OXACILLIN <=0.25 SENSITIVE Sensitive     TETRACYCLINE <=1 SENSITIVE Sensitive     VANCOMYCIN <=0.5 SENSITIVE Sensitive     TRIMETH/SULFA <=10 SENSITIVE Sensitive     CLINDAMYCIN <=0.25 SENSITIVE Sensitive     RIFAMPIN <=0.5 SENSITIVE Sensitive     Inducible Clindamycin NEGATIVE Sensitive     * MODERATE STAPHYLOCOCCUS AUREUS  MRSA PCR Screening     Status: None   Collection Time: 06/29/18  4:14 PM  Result Value Ref Range Status   MRSA by PCR NEGATIVE NEGATIVE Final    Comment:        The GeneXpert MRSA Assay (FDA approved for NASAL specimens only), is one component of a comprehensive MRSA colonization surveillance program. It is not intended to diagnose MRSA infection nor to guide or monitor treatment for MRSA infections. Performed at [facility], [address], [place], [place] [address]   Aerobic/Anaerobic Culture (surgical/deep wound)  Status: None   Collection Time: 06/29/18  6:08 PM  Result Value Ref Range Status   Specimen Description FLUID LEFT KNEE  Final   Special Requests NONE  Final   Gram Stain   Final    ABUNDANT WBC PRESENT, PREDOMINANTLY PMN RARE GRAM POSITIVE COCCI RESULT CALLED TO, READ BACK BY AND VERIFIED WITH: DR Otelia Sergeant 06/29/18 2012    Culture   Final    RARE STAPHYLOCOCCUS AUREUS NO ANAEROBES ISOLATED Performed at [facility], [address], [place], [place] [address]    Report Status 07/04/2018 FINAL  Final   Organism ID, Bacteria STAPHYLOCOCCUS AUREUS  Final      Susceptibility   Staphylococcus aureus - MIC*    CIPROFLOXACIN <=0.5 SENSITIVE Sensitive     ERYTHROMYCIN <=0.25 SENSITIVE Sensitive     GENTAMICIN <=0.5 SENSITIVE Sensitive     OXACILLIN 0.5 SENSITIVE Sensitive     TETRACYCLINE <=1 SENSITIVE Sensitive     VANCOMYCIN <=0.5 SENSITIVE Sensitive     TRIMETH/SULFA <=10 SENSITIVE Sensitive     CLINDAMYCIN <=0.25 SENSITIVE Sensitive     RIFAMPIN <=0.5 SENSITIVE Sensitive     Inducible Clindamycin NEGATIVE Sensitive     * RARE STAPHYLOCOCCUS AUREUS  Aerobic/Anaerobic Culture (surgical/deep wound)     Status: None   Collection Time: 06/29/18  7:31 PM  Result Value Ref Range Status   Specimen Description ABSCESS  Final   Special Requests LUMBAR ABSCESS  Final   Gram Stain   Final    ABUNDANT WBC PRESENT,BOTH PMN AND MONONUCLEAR ABUNDANT GRAM POSITIVE COCCI    Culture   Final    ABUNDANT STAPHYLOCOCCUS AUREUS NO ANAEROBES ISOLATED Performed at [facility], [address], [place], [place] [address]    Report Status 07/04/2018 FINAL  Final   Organism ID, Bacteria STAPHYLOCOCCUS AUREUS  Final      Susceptibility   Staphylococcus aureus - MIC*    CIPROFLOXACIN <=0.5 SENSITIVE Sensitive     ERYTHROMYCIN <=0.25 SENSITIVE Sensitive     GENTAMICIN <=0.5 SENSITIVE Sensitive     OXACILLIN 0.5 SENSITIVE Sensitive     TETRACYCLINE <=1 SENSITIVE Sensitive     VANCOMYCIN 1  SENSITIVE Sensitive     TRIMETH/SULFA <=10 SENSITIVE Sensitive     CLINDAMYCIN <=0.25 SENSITIVE Sensitive     RIFAMPIN <=0.5 SENSITIVE Sensitive     Inducible Clindamycin NEGATIVE Sensitive     * ABUNDANT STAPHYLOCOCCUS AUREUS  Culture, blood (routine x 2)     Status: None   Collection Time: 06/30/18  4:52 AM  Result Value Ref Range Status   Specimen Description BLOOD LEFT ARM  Final   Special Requests   Final    BOTTLES DRAWN AEROBIC AND ANAEROBIC Blood Culture adequate volume   Culture   Final    NO GROWTH 5 DAYS Performed at [facility], [address], [place], [place] [address]    Report Status 07/05/2018 FINAL  Final  Culture, blood (routine x 2)     Status: None   Collection Time: 06/30/18  4:55 AM  Result Value Ref Range Status   Specimen Description BLOOD LEFT WRIST  Final   Special Requests   Final    BOTTLES DRAWN AEROBIC AND ANAEROBIC Blood Culture adequate volume   Culture   Final    NO GROWTH 5 DAYS Performed at [facility], [address] [place], [place] [address]    Report Status 07/05/2018 FINAL  Final    Studies/Results: No  results found.   Assessment/Plan: Lumbar abscess (debrided 9-16) MSSA bacteremia AKI- resolved L supraspinatus cuff tear L wrist pain and swelling Leukocytosis  Total days of antibiotics:10 ancef  Wbc continue to improve, temp 100.8 last 24h.  CT of his L forearm shows no osteo or abscess.  Will cont to watch.           Johny Sax MD, FACP Infectious Diseases (pager) [phone] [email or web address] 07/08/2018, 10:44 AM  LOS: 10 days

## 2018-07-09 ENCOUNTER — Other Ambulatory Visit: Payer: Self-pay

## 2018-07-09 ENCOUNTER — Inpatient Hospital Stay (HOSPITAL_COMMUNITY)
Admission: RE | Admit: 2018-07-09 | Discharge: 2018-08-07 | DRG: 560 | Disposition: A | Discharge status: Skilled Nursing Facility | Payer: No Typology Code available for payment source | Source: Intra-hospital | Attending: Physical Medicine & Rehabilitation | Admitting: Physical Medicine & Rehabilitation

## 2018-07-09 ENCOUNTER — Encounter (HOSPITAL_COMMUNITY): Payer: Self-pay | Admitting: *Deleted

## 2018-07-09 ENCOUNTER — Telehealth (INDEPENDENT_AMBULATORY_CARE_PROVIDER_SITE_OTHER): Payer: Self-pay

## 2018-07-09 DIAGNOSIS — B9561 Methicillin susceptible Staphylococcus aureus infection as the cause of diseases classified elsewhere: Secondary | ICD-10-CM | POA: Diagnosis present

## 2018-07-09 DIAGNOSIS — I129 Hypertensive chronic kidney disease with stage 1 through stage 4 chronic kidney disease, or unspecified chronic kidney disease: Secondary | ICD-10-CM | POA: Diagnosis present

## 2018-07-09 DIAGNOSIS — D638 Anemia in other chronic diseases classified elsewhere: Secondary | ICD-10-CM | POA: Diagnosis not present

## 2018-07-09 DIAGNOSIS — I10 Essential (primary) hypertension: Secondary | ICD-10-CM

## 2018-07-09 DIAGNOSIS — M5136 Other intervertebral disc degeneration, lumbar region: Secondary | ICD-10-CM | POA: Diagnosis present

## 2018-07-09 DIAGNOSIS — R5381 Other malaise: Secondary | ICD-10-CM | POA: Diagnosis present

## 2018-07-09 DIAGNOSIS — T8140XA Infection following a procedure, unspecified, initial encounter: Secondary | ICD-10-CM | POA: Diagnosis present

## 2018-07-09 DIAGNOSIS — M4714 Other spondylosis with myelopathy, thoracic region: Secondary | ICD-10-CM | POA: Diagnosis not present

## 2018-07-09 DIAGNOSIS — M75122 Complete rotator cuff tear or rupture of left shoulder, not specified as traumatic: Secondary | ICD-10-CM | POA: Diagnosis present

## 2018-07-09 DIAGNOSIS — M4815 Ankylosing hyperostosis [Forestier], thoracolumbar region: Secondary | ICD-10-CM | POA: Diagnosis present

## 2018-07-09 DIAGNOSIS — G894 Chronic pain syndrome: Secondary | ICD-10-CM

## 2018-07-09 DIAGNOSIS — M5412 Radiculopathy, cervical region: Secondary | ICD-10-CM | POA: Diagnosis present

## 2018-07-09 DIAGNOSIS — N401 Enlarged prostate with lower urinary tract symptoms: Secondary | ICD-10-CM | POA: Diagnosis present

## 2018-07-09 DIAGNOSIS — M51369 Other intervertebral disc degeneration, lumbar region without mention of lumbar back pain or lower extremity pain: Secondary | ICD-10-CM | POA: Diagnosis present

## 2018-07-09 DIAGNOSIS — D6489 Other specified anemias: Secondary | ICD-10-CM | POA: Diagnosis present

## 2018-07-09 DIAGNOSIS — Z4789 Encounter for other orthopedic aftercare: Secondary | ICD-10-CM | POA: Diagnosis not present

## 2018-07-09 DIAGNOSIS — D62 Acute posthemorrhagic anemia: Secondary | ICD-10-CM | POA: Diagnosis present

## 2018-07-09 DIAGNOSIS — Z6841 Body Mass Index (BMI) 40.0 and over, adult: Secondary | ICD-10-CM

## 2018-07-09 DIAGNOSIS — R339 Retention of urine, unspecified: Secondary | ICD-10-CM

## 2018-07-09 DIAGNOSIS — K5900 Constipation, unspecified: Secondary | ICD-10-CM | POA: Diagnosis present

## 2018-07-09 DIAGNOSIS — Z79899 Other long term (current) drug therapy: Secondary | ICD-10-CM

## 2018-07-09 DIAGNOSIS — G061 Intraspinal abscess and granuloma: Secondary | ICD-10-CM | POA: Diagnosis present

## 2018-07-09 DIAGNOSIS — M25432 Effusion, left wrist: Secondary | ICD-10-CM

## 2018-07-09 DIAGNOSIS — R0989 Other specified symptoms and signs involving the circulatory and respiratory systems: Secondary | ICD-10-CM

## 2018-07-09 DIAGNOSIS — Z87891 Personal history of nicotine dependence: Secondary | ICD-10-CM

## 2018-07-09 DIAGNOSIS — G8220 Paraplegia, unspecified: Secondary | ICD-10-CM | POA: Diagnosis present

## 2018-07-09 DIAGNOSIS — M810 Age-related osteoporosis without current pathological fracture: Secondary | ICD-10-CM | POA: Diagnosis present

## 2018-07-09 DIAGNOSIS — E876 Hypokalemia: Secondary | ICD-10-CM | POA: Diagnosis not present

## 2018-07-09 DIAGNOSIS — R338 Other retention of urine: Secondary | ICD-10-CM | POA: Diagnosis present

## 2018-07-09 DIAGNOSIS — N189 Chronic kidney disease, unspecified: Secondary | ICD-10-CM | POA: Diagnosis present

## 2018-07-09 DIAGNOSIS — G4733 Obstructive sleep apnea (adult) (pediatric): Secondary | ICD-10-CM | POA: Diagnosis present

## 2018-07-09 DIAGNOSIS — M7989 Other specified soft tissue disorders: Secondary | ICD-10-CM | POA: Diagnosis not present

## 2018-07-09 DIAGNOSIS — Y848 Other medical procedures as the cause of abnormal reaction of the patient, or of later complication, without mention of misadventure at the time of the procedure: Secondary | ICD-10-CM | POA: Diagnosis present

## 2018-07-09 DIAGNOSIS — N319 Neuromuscular dysfunction of bladder, unspecified: Secondary | ICD-10-CM | POA: Diagnosis present

## 2018-07-09 DIAGNOSIS — R7881 Bacteremia: Secondary | ICD-10-CM | POA: Diagnosis present

## 2018-07-09 DIAGNOSIS — M25462 Effusion, left knee: Secondary | ICD-10-CM

## 2018-07-09 LAB — CBC WITH DIFFERENTIAL/PLATELET
ABS IMMATURE GRANULOCYTES: 0.4 10*3/uL — AB (ref 0.0–0.1)
BASOS ABS: 0.1 10*3/uL (ref 0.0–0.1)
BASOS PCT: 0 %
EOS PCT: 1 %
Eosinophils Absolute: 0.1 10*3/uL (ref 0.0–0.7)
HCT: 23.3 % — ABNORMAL LOW (ref 39.0–52.0)
HEMOGLOBIN: 7.3 g/dL — AB (ref 13.0–17.0)
Immature Granulocytes: 2 %
LYMPHS PCT: 8 %
Lymphs Abs: 1.2 10*3/uL (ref 0.7–4.0)
MCH: 27.9 pg (ref 26.0–34.0)
MCHC: 31.3 g/dL (ref 30.0–36.0)
MCV: 88.9 fL (ref 78.0–100.0)
MONO ABS: 1.4 10*3/uL — AB (ref 0.1–1.0)
Monocytes Relative: 9 %
NEUTROS ABS: 12.4 10*3/uL — AB (ref 1.7–7.7)
Neutrophils Relative %: 80 %
PLATELETS: 403 10*3/uL — AB (ref 150–400)
RBC: 2.62 MIL/uL — AB (ref 4.22–5.81)
RDW: 15.4 % (ref 11.5–15.5)
WBC: 15.4 10*3/uL — ABNORMAL HIGH (ref 4.0–10.5)

## 2018-07-09 LAB — CBC
HCT: 24.6 % — ABNORMAL LOW (ref 39.0–52.0)
Hemoglobin: 7.6 g/dL — ABNORMAL LOW (ref 13.0–17.0)
MCH: 27.4 pg (ref 26.0–34.0)
MCHC: 30.9 g/dL (ref 30.0–36.0)
MCV: 88.8 fL (ref 78.0–100.0)
Platelets: 480 K/uL — ABNORMAL HIGH (ref 150–400)
RBC: 2.77 MIL/uL — ABNORMAL LOW (ref 4.22–5.81)
RDW: 15.5 % (ref 11.5–15.5)
WBC: 14.5 K/uL — ABNORMAL HIGH (ref 4.0–10.5)

## 2018-07-09 LAB — HEMOGLOBIN AND HEMATOCRIT, BLOOD
HCT: 23.1 % — ABNORMAL LOW (ref 39.0–52.0)
HEMOGLOBIN: 7.2 g/dL — AB (ref 13.0–17.0)

## 2018-07-09 LAB — CREATININE, SERUM
Creatinine, Ser: 0.83 mg/dL (ref 0.61–1.24)
GFR calc Af Amer: >60 mL/min
GFR calc non Af Amer: >60 mL/min

## 2018-07-09 MED ORDER — ENSURE ENLIVE PO LIQD: 237.0000 mL | Freq: Three times a day (TID) | ORAL | Status: DC

## 2018-07-09 MED ORDER — SORBITOL 70 % SOLN: 30.0000 mL | Freq: Every day | Status: DC | PRN

## 2018-07-09 MED ORDER — TAMSULOSIN HCL 0.4 MG PO CAPS
0.4000 mg | ORAL_CAPSULE | Freq: Every day | ORAL | Status: DC
  Administered 2018-07-10 – 2018-08-07 (×29): 0.4 mg via ORAL
  Filled 2018-07-09 (×29): qty 1

## 2018-07-09 MED ORDER — BISACODYL 5 MG PO TBEC: 5.0000 mg | DELAYED_RELEASE_TABLET | Freq: Every day | ORAL | Status: DC | PRN

## 2018-07-09 MED ORDER — ALUM & MAG HYDROXIDE-SIMETH 200-200-20 MG/5ML PO SUSP: 30.0000 mL | Freq: Four times a day (QID) | ORAL | Status: DC | PRN

## 2018-07-09 MED ORDER — DULOXETINE HCL 60 MG PO CPEP
60.0000 mg | ORAL_CAPSULE | Freq: Every day | ORAL | Status: DC
  Administered 2018-07-10 – 2018-08-07 (×28): 60 mg via ORAL
  Filled 2018-07-09 (×29): qty 1

## 2018-07-09 MED ORDER — ENSURE ENLIVE PO LIQD: 237.0000 mL | Freq: Two times a day (BID) | ORAL | Status: DC

## 2018-07-09 MED ORDER — ENOXAPARIN SODIUM 30 MG/0.3ML ~~LOC~~ SOLN
30.0000 mg | SUBCUTANEOUS | Status: DC
  Administered 2018-07-10 – 2018-08-07 (×29): 30 mg via SUBCUTANEOUS
  Filled 2018-07-09 (×29): qty 0.3

## 2018-07-09 MED ORDER — VITAMIN B-12 1000 MCG PO TABS
1000.0000 ug | ORAL_TABLET | Freq: Every day | ORAL | Status: DC
  Administered 2018-07-10 – 2018-08-07 (×29): 1000 ug via ORAL
  Filled 2018-07-09 (×29): qty 1

## 2018-07-09 MED ORDER — SENNOSIDES-DOCUSATE SODIUM 8.6-50 MG PO TABS
1.0000 | ORAL_TABLET | Freq: Two times a day (BID) | ORAL | Status: DC
  Administered 2018-07-11: 1 via ORAL
  Filled 2018-07-09 (×6): qty 1

## 2018-07-09 MED ORDER — METHOCARBAMOL 1000 MG/10ML IJ SOLN
500.0000 mg | Freq: Four times a day (QID) | INTRAVENOUS | Status: DC | PRN
  Filled 2018-07-09: qty 5

## 2018-07-09 MED ORDER — FERROUS GLUCONATE 324 (38 FE) MG PO TABS
324.0000 mg | ORAL_TABLET | Freq: Three times a day (TID) | ORAL | Status: DC
  Administered 2018-07-09 – 2018-08-07 (×87): 324 mg via ORAL
  Filled 2018-07-09 (×90): qty 1

## 2018-07-09 MED ORDER — ACETAMINOPHEN 650 MG RE SUPP: 650.0000 mg | Freq: Four times a day (QID) | RECTAL | Status: DC | PRN

## 2018-07-09 MED ORDER — METHOCARBAMOL 500 MG PO TABS
500.0000 mg | ORAL_TABLET | Freq: Four times a day (QID) | ORAL | Status: DC | PRN
  Administered 2018-07-11 – 2018-08-06 (×26): 500 mg via ORAL
  Filled 2018-07-09 (×26): qty 1

## 2018-07-09 MED ORDER — ONDANSETRON HCL 4 MG/2ML IJ SOLN: 4.0000 mg | Freq: Four times a day (QID) | INTRAMUSCULAR | Status: DC | PRN

## 2018-07-09 MED ORDER — ONDANSETRON HCL 4 MG PO TABS: 4.0000 mg | ORAL_TABLET | Freq: Four times a day (QID) | ORAL | Status: DC | PRN

## 2018-07-09 MED ORDER — ENOXAPARIN SODIUM 30 MG/0.3ML ~~LOC~~ SOLN: 30.0000 mg | SUBCUTANEOUS | Status: DC

## 2018-07-09 MED ORDER — CALCIUM CARBONATE-VITAMIN D 500-200 MG-UNIT PO TABS
1.0000 | ORAL_TABLET | Freq: Every day | ORAL | Status: DC
  Administered 2018-07-10 – 2018-08-07 (×29): 1 via ORAL
  Filled 2018-07-09 (×29): qty 1

## 2018-07-09 MED ORDER — ACETAMINOPHEN 325 MG PO TABS
650.0000 mg | ORAL_TABLET | Freq: Four times a day (QID) | ORAL | Status: DC | PRN
  Administered 2018-07-10 – 2018-07-31 (×13): 650 mg via ORAL
  Filled 2018-07-09 (×15): qty 2

## 2018-07-09 MED ORDER — BISACODYL 10 MG RE SUPP
10.0000 mg | RECTAL | Status: DC
  Administered 2018-07-15 – 2018-08-02 (×2): 10 mg via RECTAL
  Filled 2018-07-09 (×2): qty 1

## 2018-07-09 MED ORDER — ADULT MULTIVITAMIN W/MINERALS CH
1.0000 | ORAL_TABLET | Freq: Every day | ORAL | Status: DC
  Administered 2018-07-10 – 2018-08-07 (×29): 1 via ORAL
  Filled 2018-07-09 (×29): qty 1

## 2018-07-09 MED ORDER — DOCUSATE SODIUM 100 MG PO CAPS
100.0000 mg | ORAL_CAPSULE | Freq: Two times a day (BID) | ORAL | Status: DC
  Administered 2018-07-11: 100 mg via ORAL
  Filled 2018-07-09 (×5): qty 1

## 2018-07-09 MED ORDER — PANTOPRAZOLE SODIUM 40 MG PO TBEC
40.0000 mg | DELAYED_RELEASE_TABLET | Freq: Two times a day (BID) | ORAL | Status: DC
  Administered 2018-07-09 – 2018-08-07 (×58): 40 mg via ORAL
  Filled 2018-07-09 (×59): qty 1

## 2018-07-09 MED ORDER — CEFAZOLIN SODIUM-DEXTROSE 2-4 GM/100ML-% IV SOLN
2.0000 g | Freq: Three times a day (TID) | INTRAVENOUS | Status: DC
  Administered 2018-07-09 – 2018-07-14 (×14): 2 g via INTRAVENOUS
  Filled 2018-07-09 (×15): qty 100

## 2018-07-09 MED ORDER — HYDROCODONE-ACETAMINOPHEN 7.5-325 MG PO TABS
1.0000 | ORAL_TABLET | Freq: Four times a day (QID) | ORAL | Status: DC | PRN
  Administered 2018-07-09 – 2018-07-23 (×30): 1 via ORAL
  Filled 2018-07-09 (×30): qty 1

## 2018-07-09 NOTE — Progress Notes (Signed)
Physical Therapy Treatment Patient Details Name: [name] [name] MRN: [id number] DOB: Feb 04, 1956 Today's Date: 07/09/2018    History of Present Illness [name] [name] is a 62 y.o. male admitted secondary to profound bilateral LE weakness. Pt with multiple diagnoses including thoracic myelopathy, flaccid paralysic both legs, left cervical radiculopathy, acute spinal cord injury; Pt is now s/p LUMBAR WOUND DEBRIDEMENT DRAINAGE AND IRRIGATION; AND ASPIRATION OF LEFT KNEE ARTHROSCOPY KNEE (Left).    PT Comments    Pt performed multiple transfer training reattempted standing trials.  Pt remains limited and unable to lift hips from bed.  Pt would benefit from  Tilt therapies or vital go lift bed to encourage weight bearing in B LEs.  Pt able to transfer OOB with assistance via lateral scoot.  Scooting remains difficult.  Pt continues to benefit from skilled rehab in a post acute setting where intense rehab is provided.  Pt is highly motivated to return home.   Follow Up Recommendations  Supervision/Assistance - 24 hour;CIR     Equipment Recommendations  None recommended by PT    Recommendations for Other Services       Precautions / Restrictions Precautions Precautions: Back;Fall Precaution Booklet Issued: No Precaution Comments: wound VAC; Restrictions Weight Bearing Restrictions: No    Mobility  Bed Mobility Overal bed mobility: Needs Assistance Bed Mobility: Rolling Rolling: Max assist;+2 for physical assistance Sidelying to sit: Max assist;+2 for physical assistance       General bed mobility comments: Pt required cues for hand placement, and foot placement to achieve rolling.  Bed pad used for scooting to edge of bed and assistance to elevate trunk into sitting.    Transfers Overall transfer level: Needs assistance Equipment used: Rolling walker (2 wheeled) Transfers: Sit to/from Stand;Lateral/Scoot Transfers Sit to Stand: Max assist;+2 physical assistance;+2  safety/equipment(+3 but unable to clear bottom.  B knees  extended on 1st transfer,  But during second transfer  patient unable to extend knees or move bottom from ned.  )        Lateral/Scoot Transfers: Max assist;+2 physical assistance;+2 safety/equipment(+3 for lateral scoot, +1 to brace WC, +2 to use pad to scoot from bed to WC.  ) General transfer comment: Pt performed various transfers including attempted sit to stand, lateral scoot and maximove (for positioning correctly into WC).Pt required max assist +3 for sit to stand trials.  Pt with improved knee extension on first attempt but unable to stand.  Opted for lateracl scoot from bed to [facility] with max +3 for safety due to patient size.  Post transfer training used maximove to reposition patient and pads for improved comfort in sitting.     Ambulation/Gait Ambulation/Gait assistance: (NT)               Psychologist, counselling mobility: Yes Wheelchair propulsion: Both upper extremities;Left lower extremity(RLE placed on R foot plate.  ) Wheelchair parts: Needs assistance(Pt able to lock brakes but required total assitance for arm rests and leg rests.  Pt is able to reach drive wheels.  Pt required min to mod assistance with WC mobility when fatigued, brief periods during straight propulsion he can perform Supervision.  ) Distance: Pt continues to fatigue with activity and remains to require rest breaks after bouts of 10 to 15 ft.  Pt slow yet determined.  Pt propelling forwards and backwards during session this am.  DOE noted with activity.   Wheelchair Assistance  Details (indicate cue type and reason): Pt required min to moderate assistance based on level of fatigue.    Modified Rankin (Stroke Patients Only)       Balance Overall balance assessment: Needs assistance Sitting-balance support: Feet supported;Single extremity supported;Bilateral upper extremity supported Sitting  balance-Leahy Scale: Poor Sitting balance - Comments: Poor but progresses to fair with back support and B foot support.     Standing balance support: Bilateral upper extremity supported Standing balance-Leahy Scale: Poor Standing balance comment: NT today.  But likely remains poor.                              Cognition Arousal/Alertness: Awake/alert Behavior During Therapy: WFL for tasks assessed/performed Overall Cognitive Status: Within Functional Limits for tasks assessed                                 General Comments: Pt very motivated continues to work hard with therapy.      Exercises      General Comments        Pertinent Vitals/Pain Pain Assessment: Faces Pain Score: 5  Pain Location: back Pain Descriptors / Indicators: Grimacing;Sore;Guarding Pain Intervention(s): Monitored during session;Repositioned;Ice applied    Home Living                      Prior Function            PT Goals (current goals can now be found in the care plan section) Acute Rehab PT Goals Patient Stated Goal: to walk in the parallel bars at OP PT Potential to Achieve Goals: Good Additional Goals Additional Goal #1: Pt will be able to negotiate wheelchair with supervision for >125 feet Progress towards PT goals: Progressing toward goals    Frequency    Min 4X/week      PT Plan Current plan remains appropriate    Co-evaluation              AM-PAC PT "6 Clicks" Daily Activity  Outcome Measure  Difficulty turning over in bed (including adjusting bedclothes, sheets and blankets)?: Unable Difficulty moving from lying on back to sitting on the side of the bed? : Unable Difficulty sitting down on and standing up from a chair with arms (e.g., wheelchair, bedside commode, etc,.)?: Unable Help needed moving to and from a bed to chair (including a wheelchair)?: A Lot Help needed walking in hospital room?: Total Help needed climbing 3-5 steps  with a railing? : Total 6 Click Score: 7    End of Session Equipment Utilized During Treatment: Gait belt Activity Tolerance: Patient tolerated treatment well Patient left: in chair;with call bell/phone within reach;with family/visitor present Nurse Communication: Mobility status;Need for lift equipment PT Visit Diagnosis: Other abnormalities of gait and mobility (R26.89);Pain Pain - Right/Left: Left Pain - part of body: Knee     Time: 0900-0950 PT Time Calculation (min) (ACUTE ONLY): 50 min  Charges:  $Therapeutic Activity: 23-37 mins $Wheel Chair Management: 8-22 mins                     Joycelyn Rua, PTA Acute Rehabilitation Services Pager [phone] Office [phone]     Quinlee Sciarra Artis Delay 07/09/2018, 10:30 AM

## 2018-07-09 NOTE — Progress Notes (Signed)
Physical Medicine and Rehabilitation Consult Reason for Consult: Decreased functional mobility Referring Physician: Dr. Otelia Sergeant   HPI: [name] [name] is a 62 y.o. right-handed male admitted 06/28/2018 with progressive lower extremity weakness as well as falls.  Per chart review patient lives with spouse.  Needed assistance for ADLs prior to admission ambulated short distance with a rolling walker was receiving outpatient therapies.  Noted history of lumbar multilevel central laminectomy for recurrent lumbar spinal stenosis with multiple back surgeries and resultant foot drop.  MRI thoracic lumbar spine showed no fracture or malalignment.  Subcentimeter cord edema T7-8 no syrinx.  Moderate to severe paraspinal muscle atrophy.  Multilevel neuroforaminal narrowing severe at T2-3, T9-10 and T10-11 as well as 2.9 x 4.3 x 6.1 fluid collection right L4-5 facet effusion..  Patient underwent lumbar wound debridement drainage irrigation 06/29/2018 per Dr. Otelia Sergeant as well as aspiration of the left knee consistent with septic arthritis.  Aspiration lumbar abscess positive for Staphylococcus aureus.  Infectious disease consulted.  TEE completed negative for vegetation.  Currently on IV Ancef awaiting plan of duration.  Nephrology consulted for elevated creatinine 2.43 and renal ultrasound showed no hydronephrosis.  Patient with complaints of left shoulder pain MRI completed showing full-thickness retracted supraspinatus tendon tear with current conservative care recommended.  A follow-up MRI cervical spine completed for neck pain left bicep weakness showing neuroforaminal narrowing C2-3 through C6-7.  Severe from C3-4 through C6-7.  Current plan is to follow-up outpatient with neurosurgery.  Patient currently on subcutaneous Lovenox for DVT prophylaxis.  Wound care nurse follow-up for surgical lumbar incision currently with a wound VAC in place and dressing changes as recommended.  Bouts of urinary retention plan  voiding trial.  Therapy evaluations have been completed.  MD requesting physical medicine rehab consult.   Review of Systems  Constitutional: Negative for fever.  HENT: Negative for hearing loss.   Eyes: Negative for blurred vision and double vision.  Respiratory: Negative for cough and shortness of breath.   Cardiovascular: Negative for chest pain and palpitations.  Gastrointestinal: Positive for constipation. Negative for nausea and vomiting.  Genitourinary: Negative for dysuria and hematuria.       Urinary retention  Musculoskeletal: Positive for back pain, falls, myalgias and neck pain.  Skin: Negative for rash.  Neurological: Positive for focal weakness.  All other systems reviewed and are negative.      Past Medical History:  Diagnosis Date  . Anemia   . Arthritis   . Chronic kidney disease    HX acute kidney failure / acute pyelonephritis / hydronephrosis / severe sepsis per discharge summary 12/27/13  . History of kidney stones   . Hypertension   . Morbid obesity (HCC)   . Obstructive sleep apnea    does not need c pap since 110 lb wt loss  . Osteoporosis   . Prurigo 2002  . Scars    ON ARMS FROM CHEMICAL EXPLOSION 1999  . Spinal stenosis         Past Surgical History:  Procedure Laterality Date  . BACK SURGERY  01/23/2010   lumbar  . CHOLECYSTECTOMY N/A 03/24/2016   Procedure: LAPAROSCOPIC CHOLECYSTECTOMY WITH INTRAOPERATIVE CHOLANGIOGRAM;  Surgeon: Chevis Pretty III, MD;  Location: [facility];  Service: General;  Laterality: N/A;  . CIRCUMCISION    . CYSTOSCOPY WITH RETROGRADE PYELOGRAM, URETEROSCOPY AND STENT PLACEMENT Left 01/20/2014   Procedure: CYSTOSCOPY WITH RETROGRADE PYELOGRAM, URETEROSCOPY AND STENT EXCHANGE;  Surgeon: Milford Cage, MD;  Location: [facility];  Service: Urology;  Laterality: Left;  bugbee bladder fulguration  . CYSTOSCOPY WITH STENT PLACEMENT Left 12/28/2013   Procedure: CYSTOSCOPY WITH STENT PLACEMENT left retrograde  pyleogram;  Surgeon: Milford Cage, MD;  Location: [facility];  Service: Urology;  Laterality: Left;  . ESOPHAGOGASTRODUODENOSCOPY N/A 12/07/2012   Procedure: ESOPHAGOGASTRODUODENOSCOPY (EGD);  Surgeon: Lodema Pilot, DO;  Location: [facility];  Service: General;  Laterality: N/A;  . HOLMIUM LASER APPLICATION Left 01/20/2014   Procedure: HOLMIUM LASER APPLICATION;  Surgeon: Milford Cage, MD;  Location: [facility];  Service: Urology;  Laterality: Left;  . KNEE ARTHROSCOPY Left 06/29/2018   Procedure: ARTHROSCOPY KNEE;  Surgeon: Kerrin Champagne, MD;  Location: [facility];  Service: Orthopedics;  Laterality: Left;  . LAPAROSCOPIC GASTRIC SLEEVE RESECTION N/A 12/07/2012   Procedure: LAPAROSCOPIC GASTRIC SLEEVE RESECTION;  Surgeon: Lodema Pilot, DO;  Location: [facility];  Service: General;  Laterality: N/A;  laparoscopic sleeve gastrectomy with EGD  . LUMBAR LAMINECTOMY N/A 11/17/2017   Procedure: L2-3 LAMINECTOMY AND REDO LAMINECTOMIES  L3-4, L4-5 AND L5-S1;  Surgeon: Kerrin Champagne, MD;  Location: [facility];  Service: Orthopedics;  Laterality: N/A;  . LUMBAR WOUND DEBRIDEMENT N/A 06/29/2018   Procedure: LUMBAR WOUND DEBRIDEMENT DRAINAGE AND IRRIGATION; AND ASPIRATION OF LEFT KNEE;  Surgeon: Kerrin Champagne, MD;  Location: [facility] OR;  Service: Orthopedics;  Laterality: N/A;  . TEE WITHOUT CARDIOVERSION N/A 07/01/2018   Procedure: TRANSESOPHAGEAL ECHOCARDIOGRAM (TEE);  Surgeon: Laurey Morale, MD;  Location: [facility];  Service: Cardiovascular;  Laterality: N/A;   History reviewed. No pertinent family history. Social History:  reports that he quit smoking about 44 years ago. His smoking use included cigarettes. He has never used smokeless tobacco. He reports that he does not drink alcohol or use drugs. Allergies:      Allergies  Allergen Reactions  . Unasyn [Ampicillin-Sulbactam Sodium] Hives, Itching, Swelling and Rash         Medications Prior to Admission  Medication Sig Dispense Refill  . calcium  citrate-vitamin D (CITRACAL+D) 315-200 MG-UNIT per tablet Take 1 tablet by mouth daily.     . DULoxetine (CYMBALTA) 60 MG capsule TAKE 1 CAPSULE(60 MG) BY MOUTH DAILY 90 capsule 0  . meloxicam (MOBIC) 15 MG tablet Take 1 tablet (15 mg total) by mouth daily. 90 tablet 4  . vitamin B-12 (CYANOCOBALAMIN) 1000 MCG tablet Take 1,000 mcg by mouth daily.      Home: Home Living Family/patient expects to be discharged to:: Private residence Living Arrangements: Spouse/significant other Available Help at Discharge: Family, Available 24 hours/day Type of Home: House Home Access: Level entry Home Layout: One level Bathroom Shower/Tub: Health visitor: Handicapped height Home Equipment: Environmental consultant - 2 wheels, Bedside commode, Wheelchair - manual, Shower seat, Grab bars - tub/shower, Hand held shower head  Functional History: Prior Function Level of Independence: Needs assistance Gait / Transfers Assistance Needed: amb with rolling walker. working with [employer] ADL's / Homemaking Assistance Needed: reports independent Functional Status:  Mobility: Bed Mobility Overal bed mobility: Needs Assistance Bed Mobility: Rolling, Sidelying to Sit, Sit to Supine Rolling: Max assist, +2 for physical assistance Sidelying to sit: Max assist, +2 for physical assistance(+3) Sit to supine: Total assist, +2 for physical assistance(+3) General bed mobility comments: max-total A x2-3 for all aspects with use of bed pads when able. cueing for use of log roll technique Transfers Overall transfer level: Needs assistance Equipment used: Rolling walker (2 wheeled) Transfers: Sit to/from Stand Sit to Stand: From elevated surface, +  2 physical assistance, Max assist General transfer comment: pt attempted x2 to achieve full erect standing position from EOB; however, pt with minimal clearance of buttocks from bed even with max A x2-3 people; pt very limited secondary to pain and  weakness Ambulation/Gait General Gait Details: unable  ADL: ADL Overall ADL's : Needs assistance/impaired Eating/Feeding: Set up, Supervision/ safety, Sitting, Bed level Grooming: Set up, Sitting Upper Body Bathing: Moderate assistance, Sitting Upper Body Bathing Details (indicate cue type and reason): assist for back Lower Body Bathing: Maximal assistance Upper Body Dressing : Moderate assistance, Sitting Upper Body Dressing Details (indicate cue type and reason): to don second hospital gown Lower Body Dressing: Total assistance Toilet Transfer: Total assistance, +2 for physical assistance, +2 for safety/equipment, Requires wide/bariatric Toilet Transfer Details (indicate cue type and reason): Pt with foley, will require use of bed pan or lift equipment Functional mobility during ADLs: Total assistance, +2 for physical assistance, +2 for safety/equipment(Pt with excellent effort and motivated - did not stand) General ADL Comments: significantly impaired by pain and decreased ROM for ADL  Cognition: Cognition Overall Cognitive Status: Within Functional Limits for tasks assessed Orientation Level: Oriented X4 Cognition Arousal/Alertness: Awake/alert Behavior During Therapy: WFL for tasks assessed/performed Overall Cognitive Status: Within Functional Limits for tasks assessed  Blood pressure 130/81, pulse 76, temperature 97.7 F (36.5 C), temperature source Oral, resp. rate 15, height 5\' 10"  (1.778 m), weight (!) 147.8 kg, SpO2 97 %. Physical Exam  Constitutional: He is oriented to person, place, and time.  Morbidly obese  HENT:  Head: Normocephalic.  Eyes: Pupils are equal, round, and reactive to light.  Neck: Normal range of motion.  Cardiovascular: Normal rate.  Respiratory: Effort normal.  GI: Soft.  Musculoskeletal: He exhibits edema (LE 1+).  Left shoulder limited in AROM due RTC injury  Neurological: He is alert and oriented to person, place, and time.  RUE 4-5/5.  LUE 2 to 5/5 prox to distal. RLE 2/5, LLE 3/5. Decreased LT in bilateral legs  Skin: Skin is warm.  Back incision is dressed  Psychiatric: He has a normal mood and affect. His behavior is normal.  Assessment/Plan: Diagnosis: Recent falls, Thoracic cord contusion with myelopathy, lumbar spondylosis with chronic radiculopathy, septic arthritis left knee 1. Does the need for close, 24 hr/day medical supervision in concert with the patient's rehab needs make it unreasonable for this patient to be served in a less intensive setting? Yes 2. Co-Morbidities requiring supervision/potential complications: morbid obesity, pain, left RTC syndrome,  3. Due to bladder management, bowel management, safety, skin/wound care, disease management, medication administration, pain management and patient education, does the patient require 24 hr/day rehab nursing? Yes 4. Does the patient require coordinated care of a physician, rehab nurse, PT (1-2 hrs/day, 5 days/week) and OT (1-2 hrs/day, 5 days/week) to address physical and functional deficits in the context of the above medical diagnosis(es)? Yes Addressing deficits in the following areas: balance, endurance, locomotion, strength, transferring, bowel/bladder control, bathing, dressing, feeding, grooming and psychosocial support 5. Can the patient actively participate in an intensive therapy program of at least 3 hrs of therapy per day at least 5 days per week? Yes 6. The potential for patient to make measurable gains while on inpatient rehab is excellent 7. Anticipated functional outcomes upon discharge from inpatient rehab are supervision to min assist and mod assist  with PT, supervision, min assist and mod assist with OT, n/a with SLP. 8. Estimated rehab length of stay to reach the above functional goals  is: 20-27 days 9. Anticipated D/C setting: Home 10. Anticipated post D/C treatments: HH therapy 11. Overall Rehab/Functional Prognosis:  excellent  RECOMMENDATIONS: This patient's condition is appropriate for continued rehabilitative care in the following setting: CIR Patient has agreed to participate in recommended program. Yes Note that insurance prior authorization may be required for reimbursement for recommended care.  Comment: Rehab Admissions Coordinator to follow up.  Thanks,  Ranelle Oyster, MD, [place] [facility]  I have personally performed a face to face diagnostic evaluation of this patient. Additionally, I have reviewed and concur with the physician assistant's documentation above.    Mcarthur Rossetti Angiulli, PA-C 07/03/2018        Revision History                        Routing History
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
## 2018-07-09 NOTE — Plan of Care (Signed)

## 2018-07-09 NOTE — Progress Notes (Signed)
Pt arrived to 4W14 with family at bedside. Pt alert and oriented. Pt complains of 3/4 pain. Pt has all belongings and vitals are WDL. Continue plan of care.

## 2018-07-09 NOTE — Progress Notes (Signed)
PROGRESS NOTE    [name] [name]  ZOX:[id number] DOB: 1956-05-02 DOA: 06/27/2018 PCP: Renford Dills, MD    Brief Narrative:  62 year old male who was admitted September 15 with worsening weakness of his lower extremities, difficulty ambulating associated with lumbosacral pain, refractive to outpatient therapy.  Patient was diagnosed with L4, L5 epidural abscess and underwent IR drainage.  His blood cultures turned positive for MSSA.  He was noted to have worsening kidney function, and hospital medicine was consulted.  At the time of the consultation blood pressure 119/80, heart rate 92, respiratory rate 18, temperature 98.4, oxygen saturation 97%, lungs are clear to auscultation, heart S1-S2 present and rhythmic, abdomen protuberant, positive nonpitting lower extremity edema.  Sodium 141, potassium 3.6, chloride 1 7, bicarb 24, glucose 187, BUN 42, creatinine 2.12.    Assessment & Plan:   Principal Problem:   Spondylosis, thoracic, with myelopathy Active Problems:   Thoracic myelopathy   Abscess in epidural space of L2-L5 lumbar spine   Acute urinary retention   Left cervical radiculopathy   Leukocytosis   Postoperative seroma involving nervous system after nervous system procedure   Shoulder pain, bilateral   Septic arthritis of knee, left (HCC)   Complete tear of left rotator cuff   AKI (acute kidney injury) (HCC)   MSSA bacteremia   Abscess in epidural space of lumbar spine   Effusion, left knee   Pain and swelling of wrist, left  1. Sepsis due to MSSA bacteremia and lumbar epidural abscess (rained 09.16). On cefazolin IV, PICC line has been placed. End date for antibiotic therapy on 08/11/2018. Repeat cultures continue with no growth. WBC at 15.4.   2. Left Knee septic arthritis. Tolerating well antibiotic therapy with Anxcef IV. Plan for inpatient rehab. There is a bed today.  3. Cervical and thoracic myelopathy. Plan to transfer to Inpatient rehab.   4. Anemia of chronic  disease. Hb today down to 7,3. Patient has remained asymptomatic, will recommend to follow on cbc in 48 hours. Hold on PRBC transfusion for now, keep Hb above 7. Follow a restrictive PRBC transfusion strategy. Patient with no known heart disease. Recheck H&H.   5. AKI. Resolved, patient tolerating po well, avoid hypotension or nephrotoxic medications.   6. Morbid obesity. Nutritional supplements, out patient follow up.     DVT prophylaxis: enoxaparin   Code Status:  full Family Communication: I spoke with patient's wife at the bedside and all questions were addressed.  Disposition Plan/ discharge barriers: CIR/ patient is medically stable for transfer to inpatient rehab.    Consultants:   ID  Procedures:     Antimicrobials:    Subjective: Patient is feeling better, no dyspnea or chest pain, no nausea or vomiting. Knee pain is well controlled.   Objective: Vitals:   07/08/18 0452 07/08/18 1252 07/08/18 2142 07/09/18 0500  BP: (!) 153/86 113/76 (!) 125/57 125/65  Pulse: 91 (!) 111 96 91  Resp: 17  18 17   Temp: 98.7 F (37.1 C) (!) 100.4 F (38 C) 100 F (37.8 C) 99.1 F (37.3 C)  TempSrc: Oral Oral Oral Oral  SpO2: 99% (!) 88% 98% 100%  Weight:      Height:        Intake/Output Summary (Last 24 hours) at 07/09/2018 0841 Last data filed at 07/09/2018 0500 Gross per 24 hour  Intake 840 ml  Output 1575 ml  Net -735 ml   Filed Weights   06/28/18 1424  Weight: (!) 147.8 kg  Examination:   General: Not in pain or dyspnea.  Neurology: Awake and alert, non focal  E ENT: no pallor, no icterus, oral mucosa moist Cardiovascular: No JVD. S1-S2 present, rhythmic, no gallops, rubs, or murmurs. No lower extremity edema. Pulmonary: positive breath sounds bilaterally, adequate air movement, no wheezing, rhonchi or rales. Gastrointestinal. Abdomen protuberant, no organomegaly, non tender, no rebound or guarding Skin. No rashes Musculoskeletal: no joint  deformities     Data Reviewed: I have personally reviewed following labs and imaging studies  CBC: Recent Labs  Lab 07/05/18 0409 07/06/18 0540 07/07/18 0448 07/08/18 0416 07/09/18 0321  WBC 24.9* 26.8* 20.1* 16.2* 15.4*  NEUTROABS 21.4* 23.6* 16.9* 13.2* 12.4*  HGB 10.2* 9.2* 8.4* 8.1* 7.3*  HCT 31.8* 28.9* 26.4* 25.5* 23.3*  MCV 87.1 86.8 87.1 87.6 88.9  PLT 195 226 284 339 403*   Basic Metabolic Panel: Recent Labs  Lab 07/04/18 0843 07/05/18 0409 07/06/18 0540 07/07/18 0448 07/08/18 0416  NA 142 144 142 140 139  K 4.1 3.9 4.2 3.9 4.0  CL 110 110 108 106 105  CO2 25 27 28 26 26   GLUCOSE 120* 126* 118* 104* 105*  BUN 20 15 11 11 12   CREATININE 0.87 0.82 0.86 0.80 0.87  CALCIUM 8.0* 8.0* 7.8* 7.9* 7.7*  MG  --   --  1.9  --  1.8   GFR: Estimated Creatinine Clearance: 131.4 mL/min (by C-G formula based on SCr of 0.87 mg/dL). Liver Function Tests: Recent Labs  Lab 07/03/18 0425  AST 32  ALT 11  ALKPHOS 114  BILITOT 0.9  PROT 5.9*  ALBUMIN 2.0*   No results for input(s): LIPASE, AMYLASE in the last 168 hours. No results for input(s): AMMONIA in the last 168 hours. Coagulation Profile: No results for input(s): INR, PROTIME in the last 168 hours. Cardiac Enzymes: No results for input(s): CKTOTAL, CKMB, CKMBINDEX, TROPONINI in the last 168 hours. BNP (last 3 results) No results for input(s): PROBNP in the last 8760 hours. HbA1C: No results for input(s): HGBA1C in the last 72 hours. CBG: No results for input(s): GLUCAP in the last 168 hours. Lipid Profile: No results for input(s): CHOL, HDL, LDLCALC, TRIG, CHOLHDL, LDLDIRECT in the last 72 hours. Thyroid Function Tests: No results for input(s): TSH, T4TOTAL, FREET4, T3FREE, THYROIDAB in the last 72 hours. Anemia Panel: Recent Labs    07/06/18 0952  RETICCTPCT 1.1      Radiology Studies: I have reviewed all of the imaging during this hospital visit personally     Scheduled Meds: .  acetaminophen  1,000 mg Oral Q6H  . bisacodyl  10 mg Rectal Q3 days  . calcium-vitamin D  1 tablet Oral Daily  . docusate sodium  100 mg Oral BID  . DULoxetine  60 mg Oral Daily  . enoxaparin (LOVENOX) injection  30 mg Subcutaneous Q24H  . feeding supplement (ENSURE ENLIVE)  237 mL Oral TID BM  . ferrous gluconate  324 mg Oral TID WC  . multivitamin with minerals  1 tablet Oral Daily  . pantoprazole  40 mg Oral BID AC  . senna-docusate  1 tablet Oral BID  . sodium chloride flush  3 mL Intravenous Q12H  . tamsulosin  0.4 mg Oral Daily  . vitamin B-12  1,000 mcg Oral Daily   Continuous Infusions: . sodium chloride    .  ceFAZolin (ANCEF) IV 2 g (07/09/18 0503)  . methocarbamol (ROBAXIN) IV       LOS: 11 days  Kadesha Virrueta Gerome Apley, MD [facility] Pager [phone]

## 2018-07-09 NOTE — H&P (Signed)
Physical Medicine and Rehabilitation Admission H&P        Chief Complaint  Patient presents with  . Fall  . Hip Pain  : HPI: [name] [name] is a 62 year old right-handed male admitted 06/28/2018 with progressive lower extremity weakness as well as falls.  Per chart review patient lives with spouse.  Needed assistance for ADLs prior to admission ambulating short distances with a rolling walker and was receiving outpatient therapies.  Noted history of lumbar multilevel central laminectomy for recurrent lumbar spinal stenosis with multiple back surgeries and resultant foot drop.  MRI thoracic lumbar spine showed no fracture or malalignment.  Subcentimeter cord edema T7-8 no syrinx.  Moderate to severe paraspinal muscle atrophy.  Multilevel neuroforaminal narrowing severe at T2-3, T9-10 and T10-11 as well as a 2.9 x 4.3 x 6.1 fluid collection right L4-5 facet effusion.  Patient underwent lumbar wound debridement drainage irrigation 06/29/2018 per Dr. Louanne Skye as well as aspiration of left knee consistent with septic arthritis.  Aspiration lumbar abscess positive for Staphylococcus aureus.  Infectious disease consulted.  TEE completed negative for vegetation.  Placed on intravenous Ancef per infectious disease and plan to continue through 08/11/2018.  Nephrology consulted for elevated creatinine 2.43 and renal ultrasound showed no hydronephrosis.  Placed on gentle IV fluids creatinine has improved to 0.87.  Patient with complaints of left shoulder pain MRI completed showing full-thickness retracted supraspinatus tendon tear with current conservative care recommended.  A follow-up MRI cervical spine completed for neck pain as well as left bicep weakness showing neuroforaminal narrowing C2-3 through C6-7.  Severe from C3-4 through C6-7.  Current plans to follow-up outpatient neurosurgery Dr. Annette Stable.  Patient also with persistent left wrist pain as well as swelling.  X-rays of left wrist 06/29/2018 showing extensive soft  tissue swelling over the dorsum of the wrist without underlying fracture.  CT of wrist ordered 07/08/2018 showed no evidence of septic joint or osteomyelitis.  Nonspecific soft tissue edema without focal fluid collection.  Patient currently on subcutaneous Lovenox for DVT prophylaxis.  Wound care nurse follow-up for surgical lumbar incision with wound VAC dressing changes as recommended.  Patient with bouts of urinary retention working with voiding trials and maintained on Flomax.  Patient is tolerating a regular diet.  Therapy evaluations completed with recommendations of physical medicine rehab consult.  Patient was admitted for a comprehensive rehab program.   Review of Systems  Constitutional: Positive for fever. Negative for chills.  HENT: Negative for hearing loss.   Eyes: Negative for blurred vision and double vision.  Respiratory: Negative for cough and shortness of breath.   Cardiovascular: Positive for leg swelling. Negative for chest pain and palpitations.  Gastrointestinal: Positive for constipation. Negative for nausea and vomiting.  Genitourinary: Negative for dysuria and hematuria.       Urinary retention  Musculoskeletal: Positive for back pain, falls, myalgias and neck pain.  Skin: Negative for rash.  Neurological: Positive for focal weakness.  All other systems reviewed and are negative.       Past Medical History:  Diagnosis Date  . Anemia    . Arthritis    . Chronic kidney disease      HX acute kidney failure / acute pyelonephritis / hydronephrosis / severe sepsis per discharge summary 12/27/13  . History of kidney stones    . Hypertension    . Morbid obesity ([place])    . Obstructive sleep apnea      does not need c pap since 110 lb wt loss  .  Osteoporosis    . Prurigo 2002  . Scars      ON ARMS FROM CHEMICAL EXPLOSION 1999  . Spinal stenosis           Past Surgical History:  Procedure Laterality Date  . BACK SURGERY   01/23/2010    lumbar  . CHOLECYSTECTOMY N/A  03/24/2016    Procedure: LAPAROSCOPIC CHOLECYSTECTOMY WITH INTRAOPERATIVE CHOLANGIOGRAM;  Surgeon: Autumn Messing III, MD;  Location: [facility];  Service: General;  Laterality: N/A;  . CIRCUMCISION      . CYSTOSCOPY WITH RETROGRADE PYELOGRAM, URETEROSCOPY AND STENT PLACEMENT Left 01/20/2014    Procedure: CYSTOSCOPY WITH RETROGRADE PYELOGRAM, URETEROSCOPY AND STENT EXCHANGE;  Surgeon: Molli Hazard, MD;  Location: [facility];  Service: Urology;  Laterality: Left;  bugbee bladder fulguration  . CYSTOSCOPY WITH STENT PLACEMENT Left 12/28/2013    Procedure: CYSTOSCOPY WITH STENT PLACEMENT left retrograde pyleogram;  Surgeon: Molli Hazard, MD;  Location: [facility];  Service: Urology;  Laterality: Left;  . ESOPHAGOGASTRODUODENOSCOPY N/A 12/07/2012    Procedure: ESOPHAGOGASTRODUODENOSCOPY (EGD);  Surgeon: Madilyn Hook, DO;  Location: [facility];  Service: General;  Laterality: N/A;  . HOLMIUM LASER APPLICATION Left 04/15/4192    Procedure: HOLMIUM LASER APPLICATION;  Surgeon: Molli Hazard, MD;  [facility]: [facility];  Service: Urology;  Laterality: Left;  . KNEE ARTHROSCOPY Left 06/29/2018    Procedure: ARTHROSCOPY KNEE;  Surgeon: Jessy Oto, MD;  Location: [facility] [place];  Service: Orthopedics;  Laterality: Left;  . LAPAROSCOPIC GASTRIC SLEEVE RESECTION N/A 12/07/2012    Procedure: LAPAROSCOPIC GASTRIC SLEEVE RESECTION;  Surgeon: Madilyn Hook, DO;  Location[facility];  Service: General;  Laterality: N/A;  laparoscopic sleeve gastrectomy with EGD  . LUMBAR LAMINECTOMY N/A 11/17/2017    Procedure: L2-3 LAMINECTOMY AND REDO LAMINECTOMIES  L3-4, L4-5 AND L5-S1;  Surgeon: Jessy Oto, MD;  Location: [place];  Service: Orthopedics;  Laterality: N/A;  . LUMBAR WOUND DEBRIDEMENT N/A 06/29/2018    Procedure: LUMBAR WOUND DEBRIDEMENT DRAINAGE AND IRRIGATION; AND ASPIRATION OF LEFT KNEE;  Surgeon: Jessy Oto, MD;  Location: [place];  Service: Orthopedics;  Laterality: N/A;  . TEE WITHOUT CARDIOVERSION N/A 07/01/2018     Procedure: TRANSESOPHAGEAL ECHOCARDIOGRAM (TEE);  Surgeon: Larey Dresser, MD;  Location[facility];  Service: Cardiovascular;  Laterality: N/A;    History reviewed. No pertinent family history. Social History:  reports that he quit smoking about 44 years ago. His smoking use included cigarettes. He has never used smokeless tobacco. He reports that he does not drink alcohol or use drugs. Allergies:      Allergies  Allergen Reactions  . Unasyn [Ampicillin-Sulbactam Sodium] Hives, Itching, Swelling and Rash          Medications Prior to Admission  Medication Sig Dispense Refill  . calcium citrate-vitamin D (CITRACAL+D) 315-200 MG-UNIT per tablet Take 1 tablet by mouth daily.       . DULoxetine (CYMBALTA) 60 MG capsule TAKE 1 CAPSULE(60 MG) BY MOUTH DAILY 90 capsule 0  . meloxicam (MOBIC) 15 MG tablet Take 1 tablet (15 mg total) by mouth daily. 90 tablet 4  . vitamin B-12 (CYANOCOBALAMIN) 1000 MCG tablet Take 1,000 mcg by mouth daily.          Drug Regimen Review Drug regimen was reviewed and remains appropriate with no significant issues identified   Home: Home Living Family/patient expects to be discharged to:: Private residence Living Arrangements: Spouse/significant other Available Help at Discharge: Family, Available 24 hours/day Type of Home:  House Home Access: Level entry Home Layout: One level Bathroom Shower/Tub: Multimedia programmer: Handicapped height Home Equipment: Environmental consultant - 2 wheels, Bedside commode, Wheelchair - manual, Shower seat, Grab bars - tub/shower, Hand held shower head   Functional History: Prior Function Level of Independence: Needs assistance Gait / Transfers Assistance Needed: amb with rolling walker. working with [employer] ADL's / Homemaking Assistance Needed: reports independent   Functional Status:  Mobility: Bed Mobility Overal bed mobility: Needs Assistance Bed Mobility: Rolling Rolling: Max assist(progressed to +1 assistance for bed  mobility.  ) Sidelying to sit: Max assist, +2 for physical assistance Sit to supine: Total assist, +2 for physical assistance(+3) General bed mobility comments: Pt able to roll and move from sidelying to sit with +1 assistance.   Transfers Overall transfer level: Needs assistance Equipment used: Rolling walker (2 wheeled) Transfer via Lift Equipment: Maximove Transfers: Sit to/from Stand, Audiological scientist to Stand: Max assist, +2 physical assistance, +2 safety/equipment(+3 for sit to stand.  +2 for lift assistance and +3 to block B knees.  )  Lateral/Scoot Transfers: Max assist, +2 physical assistance, +2 safety/equipment General transfer comment: Pt with increased difficulty attempting to stand, Pt unable to achieve knee extension in standing and only slightly clearing buttocks from edge of bed.  Attempted x3 before attempting Lateral scoot from bed to recliner chair.  Pt required multiple reps of scooting with posterior lean due to poor core stability.   Ambulation/Gait Ambulation/Gait assistance: (NT) General Gait Details: unable   ADL: ADL Overall ADL's : Needs assistance/impaired Eating/Feeding: Independent, Sitting Eating/Feeding Details (indicate cue type and reason): Does much better sitting in chair or at side of bed. Grooming: Set up, Sitting Upper Body Bathing: Moderate assistance, Sitting Upper Body Bathing Details (indicate cue type and reason): pt able to use L UE To assist when propped on a pillow. Lower Body Bathing: Maximal assistance, +2 for physical assistance Lower Body Bathing Details (indicate cue type and reason): Pt unable to fully stand at this time. Pt can lean side to side when on EOB to wash back side.  Pt reached to mid calf to bathe LEs from chair. Upper Body Dressing : Moderate assistance, Sitting Upper Body Dressing Details (indicate cue type and reason): Pt instructed to dress LUE first and how to do so with weakness in shoulder. Lower Body  Dressing: Maximal assistance, Sit to/from stand, Cueing for compensatory techniques, With adaptive equipment Lower Body Dressing Details (indicate cue type and reason): Pt has reacher, sock aid at home to assist with LE dressing.  Pt unable to stand at this time to stand at this time to pull pants up.  Must roll in bed. Toilet Transfer: Total assistance, +2 for physical assistance, +2 for safety/equipment, Requires wide/bariatric Toilet Transfer Details (indicate cue type and reason): drop arm bariatric BSC will be necessary. Toileting- Clothing Manipulation and Hygiene: Total assistance, +2 for physical assistance Functional mobility during ADLs: Total assistance, +2 for physical assistance, +2 for safety/equipment General ADL Comments: significantly impaired by pain and decreased ROM for ADL   Cognition: Cognition Overall Cognitive Status: Within Functional Limits for tasks assessed Orientation Level: Oriented X4 Cognition Arousal/Alertness: Awake/alert Behavior During Therapy: WFL for tasks assessed/performed Overall Cognitive Status: Within Functional Limits for tasks assessed General Comments: Pt very motivated.  Works hard.  Said surgery on L rotator cuff should take place when he recovers from this.    Physical Exam: Blood pressure (!) 153/86, pulse 91, temperature 98.7 F (37.1 C), temperature source  Oral, resp. rate 17, height '5\' 10"'  (1.778 m), weight (!) 147.8 kg, SpO2 99 %. Physical Exam  Vitals reviewed. Constitutional: He is oriented to person, place, and time.  HENT:  Head: Normocephalic.  Eyes: EOM are normal. Right eye exhibits no discharge. Left eye exhibits no discharge.  Neck: Normal range of motion. Neck supple. No thyromegaly present.  Cardiovascular: Normal rate and regular rhythm.  Respiratory: Effort normal and breath sounds normal. No respiratory distress.  GI: Soft. Bowel sounds are normal. He exhibits no distension.  Neurological: He is alert and oriented to  person, place, and time.  Skin:  Back incision is dressed with wound VAC   Motor strength is 5/5 in the right deltoid, bicep, tricep, grip Trace left deltoid left bicep 4- tricep 4- grip 3/5 right hip knee extensor synergy with plantar flexion 2/5 left hip knee extensor synergy with plantar flexion. Trace bilateral hip flexion Sensation is intact in bilateral upper limbs and absent at the feet bilaterally in the lower limbs to light touch and proprioception   Lab Results Last 48 Hours        Results for orders placed or performed during the hospital encounter of 06/27/18 (from the past 48 hour(s))  CBC with Differential/Platelet     Status: Abnormal    Collection Time: 07/07/18  4:48 AM  Result Value Ref Range    WBC 20.1 (H) 4.0 - 10.5 K/uL      Comment: REPEATED TO VERIFY    RBC 3.03 (L) 4.22 - 5.81 MIL/uL    Hemoglobin 8.4 (L) 13.0 - 17.0 g/dL      Comment: REPEATED TO VERIFY    HCT 26.4 (L) 39.0 - 52.0 %    MCV 87.1 78.0 - 100.0 fL    MCH 27.7 26.0 - 34.0 pg    MCHC 31.8 30.0 - 36.0 g/dL    RDW 15.7 (H) 11.5 - 15.5 %    Platelets 284 150 - 400 K/uL    Neutrophils Relative % 84 %    Lymphocytes Relative 8 %    Monocytes Relative 7 %    Eosinophils Relative 1 %    Basophils Relative 0 %    Neutro Abs 16.9 (H) 1.7 - 7.7 K/uL    Lymphs Abs 1.6 0.7 - 4.0 K/uL    Monocytes Absolute 1.4 (H) 0.1 - 1.0 K/uL    Eosinophils Absolute 0.2 0.0 - 0.7 K/uL    Basophils Absolute 0.0 0.0 - 0.1 K/uL    RBC Morphology POLYCHROMASIA PRESENT      WBC Morphology MILD LEFT SHIFT (1-5% METAS, OCC MYELO, OCC BANDS)        Comment: TOXIC GRANULATION Performed at [facility], [address] [place], [place] [address]    Basic metabolic panel     Status: Abnormal    Collection Time: 07/07/18  4:48 AM  Result Value Ref Range    Sodium 140 135 - 145 mmol/L    Potassium 3.9 3.5 - 5.1 mmol/L    Chloride 106 98 - 111 mmol/L    CO2 26 22 - 32 mmol/L    Glucose, Bld 104 (H) 70 - 99 mg/dL     BUN 11 6 - 20 mg/dL    Creatinine, Ser 0.80 0.61 - 1.24 mg/dL    Calcium 7.9 (L) 8.9 - 10.3 mg/dL    GFR calc non Af Amer >60 >60 mL/min    GFR calc Af Amer >60 >60 mL/min  Comment: (NOTE) The eGFR has been calculated using the CKD EPI equation. This calculation has not been validated in all clinical situations. eGFR's persistently <60 mL/min signify possible Chronic Kidney Disease.      Anion gap 8 5 - 15      Comment: Performed at [facility] [address] [place], [place] [address]  CBC with Differential/Platelet     Status: Abnormal    Collection Time: 07/08/18  4:16 AM  Result Value Ref Range    WBC 16.2 (H) 4.0 - 10.5 K/uL      Comment: REPEATED TO VERIFY    RBC 2.91 (L) 4.22 - 5.81 MIL/uL    Hemoglobin 8.1 (L) 13.0 - 17.0 g/dL      Comment: REPEATED TO VERIFY    HCT 25.5 (L) 39.0 - 52.0 %    MCV 87.6 78.0 - 100.0 fL    MCH 27.8 26.0 - 34.0 pg    MCHC 31.8 30.0 - 36.0 g/dL    RDW 15.5 11.5 - 15.5 %    Platelets 339 150 - 400 K/uL      Comment: REPEATED TO VERIFY    Neutrophils Relative % 82 %    Lymphocytes Relative 9 %    Monocytes Relative 8 %    Eosinophils Relative 1 %    Basophils Relative 0 %    Neutro Abs 13.2 (H) 1.7 - 7.7 K/uL    Lymphs Abs 1.5 0.7 - 4.0 K/uL    Monocytes Absolute 1.3 (H) 0.1 - 1.0 K/uL    Eosinophils Absolute 0.2 0.0 - 0.7 K/uL    Basophils Absolute 0.0 0.0 - 0.1 K/uL    RBC Morphology POLYCHROMASIA PRESENT      WBC Morphology TOXIC GRANULATION        Comment: Performed at [facility], [place] [address] [place], [place] [address]  Basic metabolic panel     Status: Abnormal    Collection Time: 07/08/18  4:16 AM  Result Value Ref Range    Sodium 139 135 - 145 mmol/L    Potassium 4.0 3.5 - 5.1 mmol/L    Chloride 105 98 - 111 mmol/L    CO2 26 22 - 32 mmol/L    Glucose, Bld 105 (H) 70 - 99 mg/dL    BUN 12 6 - 20 mg/dL    Creatinine, Ser 0.87 0.61 - 1.24 mg/dL    Calcium 7.7 (L) 8.9 - 10.3 mg/dL    GFR calc non Af Amer  >60 >60 mL/min    GFR calc Af Amer >60 >60 mL/min      Comment: (NOTE) The eGFR has been calculated using the CKD EPI equation. This calculation has not been validated in all clinical situations. eGFR's persistently <60 mL/min signify possible Chronic Kidney Disease.      Anion gap 8 5 - 15      Comment: Performed at [facility] [address], [place], [place] [address]  Magnesium     Status: None    Collection Time: 07/08/18  4:16 AM  Result Value Ref Range    Magnesium 1.8 1.7 - 2.4 mg/dL      Comment: Performed at [facility] [address], [place], [place] [address]      Imaging Results (Last 48 hours)  No results found.           Medical Problem List and Plan: 1.  Decreased functional mobility secondary to thoracic cord contusion with  myelopathy/chronic radiculopathy-status post lumbar wound debridement drainage irrigation 06/30/2018 with wound VAC placement, septic arthritis left knee MSSA bacteremia 2.  DVT Prophylaxis/Anticoagulation: Subcutaneous Lovenox.  Check vascular study 3. Pain Management:  Robaxin and hydrocodone as needed 4. Mood: Cymbalta 60 mg daily.  Provide emotional support 5. Neuropsych: This patient is capable of making decisions on his own behalf. 6. Skin/Wound Care: Routine skin checks/wound VAC 7. Fluids/Electrolytes/Nutrition: Routine in and outs with follow-up chemistries 8.  ID/MSSA bacteremia.  IV Ancef 2 g every 8 hours through 08/11/2018 and stop.  Follow-up infectious disease. 9.  Septic arthritis left knee.  Status post arthroscopy irrigation and drainage 10.  Left shoulder full-thickness retracted supraspinatus tendon tear.  Conservative care 11.  Acute on chronic anemia.  Continue ferrous gluconate 12.  BPH/urinary retention.  Check PVR.  Flomax 0.4 mg daily 13.  Constipation.  Laxative assistance   Post Admission Physician Evaluation: 1. Functional deficits secondary  to Paraparesis and left C5 radiculopathy. 2. Patient  admitted to receive collaborative, interdisciplinary care between the physiatrist, rehab nursing staff, and therapy team. 3. Patient's level of medical complexity and substantial therapy needs in context of that medical necessity cannot be provided at a lesser intensity of care. 4. Patient has experienced substantial functional loss from his/her baseline.Judging by the patient's diagnosis, physical exam, and functional history, the patient has potential for functional progress which will result in measurable gains while on inpatient rehab.  These gains will be of substantial and practical use upon discharge in facilitating mobility and self-care at the household level. 5. Physiatrist will provide 24 hour management of medical needs as well as oversight of the therapy plan/treatment and provide guidance as appropriate regarding the interaction of the two. 6. 24 hour rehab nursing will assist in the management of  bladder management, bowel management, safety, skin/wound care, disease management, medication administration, pain management and patient education  and help integrate therapy concepts, techniques,education, etc. 7. PT will assess and treat for:pre gait, gait training, endurance , safety, equipment, neuromuscular re education  .  Goals are: minimal assist. 8. OT will assess and treat for ADLs, Cognitive perceptual skills, Neuromuscular re education, safety, endurance, equipment  .  Goals are: minimal assist.  9. SLP will assess and treat for  .  Goals are: N/A. 10. Case Management and Social Worker will assess and treat for psychological issues and discharge planning. 11. Team conference will be held weekly to assess progress toward goals and to determine barriers to discharge. 12.  Patient will receive at least 3 hours of therapy per day at least 5 days per week. 13. ELOS and Prognosis: 20-24d good   "I have personally performed a face to face diagnostic evaluation of this patient.   Additionally, I have reviewed and concur with the physician assistant's documentation above."  Charlett Blake M.D. [facility] FAAPM&R (Sports Med, Neuromuscular Med) Diplomate Am Board of Electrodiagnostic Med  Elizabeth Sauer 07/08/2018

## 2018-07-09 NOTE — Telephone Encounter (Signed)
[name] [name] with [facility] called stated that she needs order today for patient to be discharged to inpatient rehab facility. Patient is in Rm [id number] Please call her if any additional information is needed. Thanks [phone]

## 2018-07-09 NOTE — Progress Notes (Addendum)
Inpatient Rehabilitation  Received call from Dr. Otelia Sergeant given verbal permission to place discharge order to facilitate IP Rehab admission.  Order has been placed.  Updated team.  Call if questions.   Charlane Ferretti., CCC/SLP Admission Coordinator  [facility]  Cell [phone]

## 2018-07-09 NOTE — Telephone Encounter (Signed)
Dr. Otelia Sergeant called and spoke with her

## 2018-07-09 NOTE — Progress Notes (Addendum)
     Subjective: 8 Days Post-Op Procedure(s) (LRB): TRANSESOPHAGEAL ECHOCARDIOGRAM (TEE) (N/A) Awake, alert and oriented x 4. OOB to recliner for 5 hours yesterday. PICCU line placed right arm. May discontinue left elbow cubital IV. Patient reports pain as moderate.    Objective:   VITALS:  Temp:  [99.1 F (37.3 C)-100.4 F (38 C)] 99.1 F (37.3 C) (09/26 0500) Pulse Rate:  [91-111] 91 (09/26 0500) Resp:  [17-18] 17 (09/26 0500) BP: (113-125)/(57-76) 125/65 (09/26 0500) SpO2:  [88 %-100 %] 100 % (09/26 0500)  ABD soft Intact pulses distally Incision: dressing C/D/I and VACintact   LABS Recent Labs    07/07/18 0448 07/08/18 0416 07/09/18 0321  HGB 8.4* 8.1* 7.3*  WBC 20.1* 16.2* 15.4*  PLT 284 339 403*   Recent Labs    07/07/18 0448 07/08/18 0416  NA 140 139  K 3.9 4.0  CL 106 105  CO2 26 26  BUN 11 12  CREATININE 0.80 0.87  GLUCOSE 104* 105*   No results for input(s): LABPT, INR in the last 72 hours.   Assessment/Plan: 8 Days Post-Op Procedure(s) (LRB): TRANSESOPHAGEAL ECHOCARDIOGRAM (TEE) (N/A)  Advance diet Up with therapy Continue ABX therapy due to Culture taken of surgical site during incision and drainage of lumbar abscess and left knee pyarthrosis Orthopaedically he is stable and ready for transfer to CIR vs SNF.  Vira Browns 07/09/2018, 8:43 AMPatient ID: [name] [name], male   DOB: 1955-10-24, 62 y.o.   MRN: [id number]

## 2018-07-09 NOTE — Progress Notes (Signed)
Inpatient Rehabilitation  I note that MD has stated that patient is medically ready for IP Rehab today.  I will fax and notify case manager with [facility] and update team when I have a decision.  Hopeful for admit day.  Call if questions.   Charlane Ferretti., CCC/SLP Admission Coordinator  [facility]  Cell [phone]

## 2018-07-09 NOTE — Progress Notes (Signed)
INFECTIOUS DISEASE PROGRESS NOTE  ID: [name] [name] is a 62 y.o. male with  Principal Problem:   Spondylosis, thoracic, with myelopathy Active Problems:   Thoracic myelopathy   Abscess in epidural space of L2-L5 lumbar spine   Acute urinary retention   Left cervical radiculopathy   Leukocytosis   Postoperative seroma involving nervous system after nervous system procedure   Shoulder pain, bilateral   Septic arthritis of knee, left (HCC)   Complete tear of left rotator cuff   AKI (acute kidney injury) (HCC)   MSSA bacteremia   Abscess in epidural space of lumbar spine   Effusion, left knee   Pain and swelling of wrist, left  Subjective: Feels better, limited range of motion of L wrist.   Abtx:  Anti-infectives (From admission, onward)   Start     Dose/Rate Route Frequency Ordered Stop   06/29/18 1936  vancomycin (VANCOCIN) powder  Status:  Discontinued       As needed 06/29/18 1936 06/29/18 2121   06/29/18 1928  polymyxin B 500,000 Units, bacitracin 50,000 Units in sodium chloride 0.9 % 500 mL irrigation  Status:  Discontinued       As needed 06/29/18 1928 06/29/18 2121   06/29/18 1800  vancomycin (VANCOCIN) IVPB 1000 mg/200 mL premix  Status:  Discontinued     1,000 mg 200 mL/hr over 60 Minutes Intravenous  Once 06/29/18 0032 06/29/18 1042   06/29/18 1400  ceFAZolin (ANCEF) IVPB 2g/100 mL premix     2 g 200 mL/hr over 30 Minutes Intravenous Every 8 hours 06/29/18 1042     06/29/18 0200  ceFEPIme (MAXIPIME) 2 g in sodium chloride 0.9 % 100 mL IVPB  Status:  Discontinued     2 g 200 mL/hr over 30 Minutes Intravenous Every 8 hours 06/29/18 0031 06/29/18 1042   06/29/18 0130  vancomycin (VANCOCIN) 2,000 mg in sodium chloride 0.9 % 500 mL IVPB     2,000 mg 250 mL/hr over 120 Minutes Intravenous  Once 06/29/18 0032 06/29/18 0257      Medications:  Scheduled: . acetaminophen  1,000 mg Oral Q6H  . bisacodyl  10 mg Rectal Q3 days  . calcium-vitamin D  1 tablet Oral Daily    . docusate sodium  100 mg Oral BID  . DULoxetine  60 mg Oral Daily  . enoxaparin (LOVENOX) injection  30 mg Subcutaneous Q24H  . feeding supplement (ENSURE ENLIVE)  237 mL Oral TID BM  . ferrous gluconate  324 mg Oral TID WC  . multivitamin with minerals  1 tablet Oral Daily  . pantoprazole  40 mg Oral BID AC  . senna-docusate  1 tablet Oral BID  . sodium chloride flush  3 mL Intravenous Q12H  . tamsulosin  0.4 mg Oral Daily  . vitamin B-12  1,000 mcg Oral Daily    Objective: Vital signs in last 24 hours: Temp:  [99.1 F (37.3 C)-100.4 F (38 C)] 99.1 F (37.3 C) (09/26 0500) Pulse Rate:  [91-111] 91 (09/26 0500) Resp:  [17-18] 17 (09/26 0500) BP: (113-125)/(57-76) 125/65 (09/26 0500) SpO2:  [88 %-100 %] 100 % (09/26 0500)   General appearance: alert, cooperative and no distress Resp: clear to auscultation bilaterally Cardio: regular rate and rhythm GI: normal findings: bowel sounds normal and soft, non-tender Extremities: swelling of L wrist. limitation of movement.   Lab Results Recent Labs    07/07/18 0448 07/08/18 0416 07/09/18 0321  WBC 20.1* 16.2* 15.4*  HGB 8.4* 8.1* 7.3*  HCT 26.4* 25.5* 23.3*  NA 140 139  --   K 3.9 4.0  --   CL 106 105  --   CO2 26 26  --   BUN 11 12  --   CREATININE 0.80 0.87  --    Liver Panel No results for input(s): PROT, ALBUMIN, AST, ALT, ALKPHOS, BILITOT, BILIDIR, IBILI in the last 72 hours. Sedimentation Rate No results for input(s): ESRSEDRATE in the last 72 hours. C-Reactive Protein No results for input(s): CRP in the last 72 hours.  Microbiology: Recent Results (from the past 240 hour(s))  MRSA PCR Screening     Status: None   Collection Time: 06/29/18  4:14 PM  Result Value Ref Range Status   MRSA by PCR NEGATIVE NEGATIVE Final    Comment:        The GeneXpert MRSA Assay (FDA approved for NASAL specimens only), is one component of a comprehensive MRSA colonization surveillance program. It is not intended to  diagnose MRSA infection nor to guide or monitor treatment for MRSA infections. Performed at [facility], [address] [place], [place] [address]   Aerobic/Anaerobic Culture (surgical/deep wound)     Status: None   Collection Time: 06/29/18  6:08 PM  Result Value Ref Range Status   Specimen Description FLUID LEFT KNEE  Final   Special Requests NONE  Final   Gram Stain   Final    ABUNDANT WBC PRESENT, PREDOMINANTLY PMN RARE GRAM POSITIVE COCCI RESULT CALLED TO, READ BACK BY AND VERIFIED WITH: DR Otelia Sergeant 06/29/18 2012    Culture   Final    RARE STAPHYLOCOCCUS AUREUS NO ANAEROBES ISOLATED Performed at [facility], [address], [place], [place] [address]    Report Status 07/04/2018 FINAL  Final   Organism ID, Bacteria STAPHYLOCOCCUS AUREUS  Final      Susceptibility   Staphylococcus aureus - MIC*    CIPROFLOXACIN <=0.5 SENSITIVE Sensitive     ERYTHROMYCIN <=0.25 SENSITIVE Sensitive     GENTAMICIN <=0.5 SENSITIVE Sensitive     OXACILLIN 0.5 SENSITIVE Sensitive     TETRACYCLINE <=1 SENSITIVE Sensitive     VANCOMYCIN <=0.5 SENSITIVE Sensitive     TRIMETH/SULFA <=10 SENSITIVE Sensitive     CLINDAMYCIN <=0.25 SENSITIVE Sensitive     RIFAMPIN <=0.5 SENSITIVE Sensitive     Inducible Clindamycin NEGATIVE Sensitive     * RARE STAPHYLOCOCCUS AUREUS  Aerobic/Anaerobic Culture (surgical/deep wound)     Status: None   Collection Time: 06/29/18  7:31 PM  Result Value Ref Range Status   Specimen Description ABSCESS  Final   Special Requests LUMBAR ABSCESS  Final   Gram Stain   Final    ABUNDANT WBC PRESENT,BOTH PMN AND MONONUCLEAR ABUNDANT GRAM POSITIVE COCCI    Culture   Final    ABUNDANT STAPHYLOCOCCUS AUREUS NO ANAEROBES ISOLATED Performed at [facility], [address], [place], [place] [address]    Report Status 07/04/2018 FINAL  Final   Organism ID, Bacteria STAPHYLOCOCCUS AUREUS  Final      Susceptibility   Staphylococcus aureus - MIC*    CIPROFLOXACIN  <=0.5 SENSITIVE Sensitive     ERYTHROMYCIN <=0.25 SENSITIVE Sensitive     GENTAMICIN <=0.5 SENSITIVE Sensitive     OXACILLIN 0.5 SENSITIVE Sensitive     TETRACYCLINE <=1 SENSITIVE Sensitive     VANCOMYCIN 1 SENSITIVE Sensitive     TRIMETH/SULFA <=10 SENSITIVE Sensitive     CLINDAMYCIN <=0.25 SENSITIVE Sensitive  RIFAMPIN <=0.5 SENSITIVE Sensitive     Inducible Clindamycin NEGATIVE Sensitive     * ABUNDANT STAPHYLOCOCCUS AUREUS  Culture, blood (routine x 2)     Status: None   Collection Time: 06/30/18  4:52 AM  Result Value Ref Range Status   Specimen Description BLOOD LEFT ARM  Final   Special Requests   Final    BOTTLES DRAWN AEROBIC AND ANAEROBIC Blood Culture adequate volume   Culture   Final    NO GROWTH 5 DAYS Performed at [facility], [address], [place], [place] [address]    Report Status 07/05/2018 FINAL  Final  Culture, blood (routine x 2)     Status: None   Collection Time: 06/30/18  4:55 AM  Result Value Ref Range Status   Specimen Description BLOOD LEFT WRIST  Final   Special Requests   Final    BOTTLES DRAWN AEROBIC AND ANAEROBIC Blood Culture adequate volume   Culture   Final    NO GROWTH 5 DAYS Performed at [facility], [address] [place], [place] [address]    Report Status 07/05/2018 FINAL  Final    Studies/Results: Ct Wrist Left W Contrast  Result Date: 07/08/2018 CLINICAL DATA:  Wrist pain and swelling since falling a couple weeks ago. Staph bacteremia. Septic arthritis suspected. EXAM: CT OF THE UPPER LEFT EXTREMITY WITH CONTRAST TECHNIQUE: Multidetector CT imaging of the left wrist and distal forearm was performed according to the standard protocol following intravenous contrast administration. Patient was unable to raise his arm over his head. The study was performed with the arm at his side. This results in significant degradation of image quality. COMPARISON:  Radiographs 06/29/2018. CONTRAST:  75mL OMNIPAQUE IOHEXOL 300 MG/ML   SOLN FINDINGS: Bones/Joint/Cartilage No acute fracture, dislocation or bone destruction identified. No large joint effusions identified. Ligaments Suboptimally assessed by CT. Muscles and Tendons No focal muscular abnormality or significant tenosynovitis identified. Soft tissues No evidence of foreign body or soft tissue emphysema. Probable mild generalized soft tissue edema without apparent focal fluid collection. IMPRESSION: 1. No evidence of septic joint or osteomyelitis. 2. Nonspecific soft tissue edema without focal fluid collection. 3. This study is significantly limited, with image quality degraded by body habitus and positioning. Accordingly, sensitivity is reduced. Electronically Signed   By: Carey Bullocks M.D.   On: 07/08/2018 13:36   [place] Ekg Site Rite  Result Date: 07/08/2018 If Site Rite image not attached, placement could not be confirmed due to current cardiac rhythm.    Assessment/Plan: Lumbar abscess (debrided 9-16) MSSA bacteremia AKI- resolved L supraspinatus cuff tear L wrist pain and swelling Leukocytosis  Total days of antibiotics:11ancef     WBC improving , still with low grade temps.  Will be available as needed after transfer to rehab Will need close f/u of his L wrist. Despite negative CT, it is still concerning.  Needs to continue ancef for 42 days, end date of August 11, 2018      Johny Sax MD, FACP Infectious Diseases (pager) [phone] [email or web address] 07/09/2018, 10:47 AM  LOS: 11 days

## 2018-07-09 NOTE — Progress Notes (Addendum)
Inpatient Rehabilitation  I have insurance authorization to admit patient to IP Rehab.  I await MD clearance, order to discharge.  Attempted to call office of Dr. Otelia Sergeant but office is closed for lunch.  Will re-attempt in a little.  Notified nurse case manager, Darl Pikes.  Call if questions.   Update: I have called Dr. Barbaraann Faster office and left a message requesting an order to discharge to process admission.   Charlane Ferretti., CCC/SLP Admission Coordinator  [facility] Inpatient Rehabilitation  Cell [phone]

## 2018-07-10 ENCOUNTER — Inpatient Hospital Stay (HOSPITAL_COMMUNITY): Payer: No Typology Code available for payment source

## 2018-07-10 ENCOUNTER — Encounter (HOSPITAL_COMMUNITY): Payer: No Typology Code available for payment source

## 2018-07-10 ENCOUNTER — Inpatient Hospital Stay (HOSPITAL_COMMUNITY): Payer: No Typology Code available for payment source | Admitting: Physical Therapy

## 2018-07-10 DIAGNOSIS — M5412 Radiculopathy, cervical region: Secondary | ICD-10-CM

## 2018-07-10 DIAGNOSIS — M4714 Other spondylosis with myelopathy, thoracic region: Secondary | ICD-10-CM

## 2018-07-10 DIAGNOSIS — G061 Intraspinal abscess and granuloma: Secondary | ICD-10-CM

## 2018-07-10 LAB — CBC WITH DIFFERENTIAL/PLATELET
ABS IMMATURE GRANULOCYTES: 0.2 10*3/uL — AB (ref 0.0–0.1)
BASOS ABS: 0 10*3/uL (ref 0.0–0.1)
BASOS PCT: 0 %
Eosinophils Absolute: 0.1 10*3/uL (ref 0.0–0.7)
Eosinophils Relative: 1 %
HCT: 22.8 % — ABNORMAL LOW (ref 39.0–52.0)
Hemoglobin: 7.2 g/dL — ABNORMAL LOW (ref 13.0–17.0)
IMMATURE GRANULOCYTES: 2 %
LYMPHS PCT: 8 %
Lymphs Abs: 0.9 10*3/uL (ref 0.7–4.0)
MCH: 28.3 pg (ref 26.0–34.0)
MCHC: 31.6 g/dL (ref 30.0–36.0)
MCV: 89.8 fL (ref 78.0–100.0)
Monocytes Absolute: 1.2 10*3/uL — ABNORMAL HIGH (ref 0.1–1.0)
Monocytes Relative: 10 %
NEUTROS ABS: 9.3 10*3/uL — AB (ref 1.7–7.7)
NEUTROS PCT: 79 %
PLATELETS: 453 10*3/uL — AB (ref 150–400)
RBC: 2.54 MIL/uL — AB (ref 4.22–5.81)
RDW: 15.3 % (ref 11.5–15.5)
WBC: 11.8 10*3/uL — AB (ref 4.0–10.5)

## 2018-07-10 LAB — COMPREHENSIVE METABOLIC PANEL
ALBUMIN: 1.7 g/dL — AB (ref 3.5–5.0)
ALT: 10 U/L (ref 0–44)
AST: 27 U/L (ref 15–41)
Alkaline Phosphatase: 68 U/L (ref 38–126)
Anion gap: 7 (ref 5–15)
BUN: 11 mg/dL (ref 6–20)
CHLORIDE: 103 mmol/L (ref 98–111)
CO2: 26 mmol/L (ref 22–32)
CREATININE: 0.84 mg/dL (ref 0.61–1.24)
Calcium: 7.8 mg/dL — ABNORMAL LOW (ref 8.9–10.3)
GFR calc non Af Amer: >60 mL/min (ref >60)
GLUCOSE: 104 mg/dL — AB (ref 70–99)
Potassium: 3.9 mmol/L (ref 3.5–5.1)
SODIUM: 136 mmol/L (ref 135–145)
Total Bilirubin: 0.6 mg/dL (ref 0.3–1.2)
Total Protein: 6.2 g/dL — ABNORMAL LOW (ref 6.5–8.1)

## 2018-07-10 MED ORDER — SODIUM CHLORIDE 0.9% FLUSH
10.0000 mL | INTRAVENOUS | Status: DC | PRN
  Administered 2018-07-10 – 2018-07-21 (×9): 10 mL
  Administered 2018-07-22: 20 mL
  Administered 2018-07-25 – 2018-08-07 (×8): 10 mL
  Filled 2018-07-10 (×18): qty 40

## 2018-07-10 NOTE — Evaluation (Signed)
Physical Therapy Assessment and Plan  Patient Details  Name: [name] [name] MRN: [id number] Date of Birth: 09/10/1956  PT Diagnosis: Abnormal posture, Difficulty walking and Paraplegia Rehab Potential: Fair ELOS: 25-28 days   Today's Date: 07/10/2018 PT Individual Time: 1430-1500 PT Individual Time Calculation (min): 30 min    Problem List:  Patient Active Problem List   Diagnosis Date Noted  . Abscess in epidural space of lumbar spine   . Effusion, left knee   . Pain and swelling of wrist, left   . Complete tear of left rotator cuff 07/03/2018    Class: Chronic  . AKI (acute kidney injury) ([place])   . MSSA bacteremia   . Septic arthritis of knee, left ([employer]) 06/30/2018    Class: Acute  . Leukocytosis   . Postoperative seroma involving nervous system after nervous system procedure   . Shoulder pain, bilateral   . Thoracic myelopathy 06/28/2018  . Spondylosis, thoracic, with myelopathy 06/28/2018    Class: Acute  . Abscess in epidural space of L2-L5 lumbar spine 06/28/2018    Class: Acute  . Acute urinary retention 06/28/2018  . Left cervical radiculopathy 06/28/2018    Class: Acute  . Spinal stenosis of lumbar region 11/17/2017    Class: Chronic  . Status post lumbar laminectomy 11/17/2017  . Lumbar stenosis with neurogenic claudication 08/18/2017  . Forestier's disease of thoracolumbar region 08/18/2017  . Degenerative disc disease, lumbar 08/18/2017  . Lumbar radiculopathy 09/30/2016  . Morbid (severe) obesity due to excess calories ([place]) 09/30/2016  . Biliary calculus with acute cholecystitis 03/21/2016  . Acute pyelonephritis 12/28/2013  . Ureteral stone with hydronephrosis 12/28/2013  . Acute kidney failure ([place]) 12/27/2013  . Severe sepsis with acute organ dysfunction ([place]) 12/27/2013  . Hypotension 12/27/2013  . Lactic acidosis 12/27/2013  . Sepsis secondary to UTI ([place]) 12/27/2013  . EMPYEMA CHEST 08/30/2008  . PRURIGO 08/30/2008  . OBSTRUCTIVE SLEEP APNEA  08/30/2008    Past Medical History:  Past Medical History:  Diagnosis Date  . Anemia   . Arthritis   . Chronic kidney disease    HX acute kidney failure / acute pyelonephritis / hydronephrosis / severe sepsis per discharge summary 12/27/13  . History of kidney stones   . Hypertension   . Morbid obesity ([place])   . Obstructive sleep apnea    does not need c pap since 110 lb wt loss  . Osteoporosis   . Prurigo 2002  . Scars    ON ARMS FROM CHEMICAL EXPLOSION 1999  . Spinal stenosis    Past Surgical History:  Past Surgical History:  Procedure Laterality Date  . BACK SURGERY  01/23/2010   lumbar  . CHOLECYSTECTOMY N/A 03/24/2016   Procedure: LAPAROSCOPIC CHOLECYSTECTOMY WITH INTRAOPERATIVE CHOLANGIOGRAM;  Surgeon: Autumn Messing III, MD;  Location: [facility];  Service: General;  Laterality: N/A;  . CIRCUMCISION    . CYSTOSCOPY WITH RETROGRADE PYELOGRAM, URETEROSCOPY AND STENT PLACEMENT Left 01/20/2014   Procedure: CYSTOSCOPY WITH RETROGRADE PYELOGRAM, URETEROSCOPY AND STENT EXCHANGE;  Surgeon: Molli Hazard, MD;  Location: [facility];  Service: Urology;  Laterality: Left;  bugbee bladder fulguration  . CYSTOSCOPY WITH STENT PLACEMENT Left 12/28/2013   Procedure: CYSTOSCOPY WITH STENT PLACEMENT left retrograde pyleogram;  Surgeon: Molli Hazard, MD;  Location: [facility];  Service: Urology;  Laterality: Left;  . ESOPHAGOGASTRODUODENOSCOPY N/A 12/07/2012   Procedure: ESOPHAGOGASTRODUODENOSCOPY (EGD);  Surgeon: Madilyn Hook, DO;  Location: [facility];  Service: General;  Laterality: N/A;  . HOLMIUM LASER APPLICATION Left  01/20/2014   Procedure: HOLMIUM LASER APPLICATION;  Surgeon: Molli Hazard, MD;  Location: [facility];  Service: Urology;  Laterality: Left;  . KNEE ARTHROSCOPY Left 06/29/2018   Procedure: ARTHROSCOPY KNEE;  Surgeon: Jessy Oto, MD;  Location: [facility];  Service: Orthopedics;  Laterality: Left;  . LAPAROSCOPIC GASTRIC SLEEVE RESECTION N/A 12/07/2012   Procedure: LAPAROSCOPIC  GASTRIC SLEEVE RESECTION;  Surgeon: Madilyn Hook, DO;  Location: [facility];  Service: General;  Laterality: N/A;  laparoscopic sleeve gastrectomy with EGD  . LUMBAR LAMINECTOMY N/A 11/17/2017   Procedure: L2-3 LAMINECTOMY AND REDO LAMINECTOMIES  L3-4, L4-5 AND L5-S1;  Surgeon: Jessy Oto, MD;  Location: [facility];  Service: Orthopedics;  Laterality: N/A;  . LUMBAR WOUND DEBRIDEMENT N/A 06/29/2018   Procedure: LUMBAR WOUND DEBRIDEMENT DRAINAGE AND IRRIGATION; AND ASPIRATION OF LEFT KNEE;  Surgeon: Jessy Oto, MD;  Location: [facility];  Service: Orthopedics;  Laterality: N/A;  . TEE WITHOUT CARDIOVERSION N/A 07/01/2018   Procedure: TRANSESOPHAGEAL ECHOCARDIOGRAM (TEE);  Surgeon: Larey Dresser, MD;  Location: [facility];  Service: Cardiovascular;  Laterality: N/A;    Assessment & Plan Clinical Impression: [name] [name] is a 62 year old right-handed male admitted 06/28/2018 with progressive lower extremity weakness as well as falls.  Per chart review patient lives with spouse.  Needed assistance for ADLs prior to admission ambulating short distances with a rolling walker and was receiving outpatient therapies.  Noted history of lumbar multilevel central laminectomy for recurrent lumbar spinal stenosis with multiple back surgeries and resultant foot drop.  MRI thoracic lumbar spine showed no fracture or malalignment.  Subcentimeter cord edema T7-8 no syrinx.  Moderate to severe paraspinal muscle atrophy.  Multilevel neuroforaminal narrowing severe at T2-3, T9-10 and T10-11 as well as a 2.9 x 4.3 x 6.1 fluid collection right L4-5 facet effusion.  Patient underwent lumbar wound debridement drainage irrigation 06/29/2018 per Dr. Louanne Skye as well as aspiration of left knee consistent with septic arthritis.  Aspiration lumbar abscess positive for Staphylococcus aureus.  Infectious disease consulted.  TEE completed negative for vegetation.  Placed on intravenous Ancef per infectious disease and plan to continue through  08/10/2018.  Nephrology consulted for elevated creatinine 2.43 and renal ultrasound showed no hydronephrosis.  Placed on gentle IV fluids creatinine has improved to 0.87.  Patient with complaints of left shoulder pain MRI completed showing full-thickness retracted supraspinatus tendon tear with current conservative care recommended.  A follow-up MRI cervical spine completed for neck pain as well as left bicep weakness showing neuroforaminal narrowing C2-3 through C6-7.  Severe from C3-4 through C6-7.  Current plans to follow-up outpatient neurosurgery Dr. Annette Stable.  Patient also with persistent left wrist pain as well as swelling.  X-rays of left wrist 06/29/2018 showing extensive soft tissue swelling over the dorsum of the wrist without underlying fracture.  CT of wrist ordered 07/08/2018 showed no evidence of septic joint or osteomyelitis.  Nonspecific soft tissue edema without focal fluid collection.  Patient currently on subcutaneous Lovenox for DVT prophylaxis.  Wound care nurse follow-up for surgical lumbar incision with wound VAC dressing changes as recommended M,W,F.  Patient with bouts of urinary retention working with voiding trials and maintained on Flomax.  Patient is tolerating a regular diet.  Therapy evaluations completed with recommendations of physical medicine rehab consult.  Patient was admitted for a comprehensive rehab program 07/09/18.     Patient currently requires total +2 assist with mobility secondary to muscle weakness, muscle paralysis and L supraspinatus tear, decreased cardiorespiratoy endurance, abnormal  tone and decreased coordination and decreased sitting balance, decreased standing balance, decreased postural control, decreased balance strategies and paraplegia.  Prior to hospitalization, patient was modified independent  with mobility and lived with Spouse in a House home.  Home access is threshholdStairs to enter.  Patient will benefit from skilled PT intervention to maximize safe  functional mobility, minimize fall risk and decrease caregiver burden for planned discharge home with 24 hour assist.  Anticipate patient will benefit from follow up [facility] at discharge.  PT - End of Session Activity Tolerance: Tolerates 10 - 20 min activity with multiple rests Endurance Deficit: Yes PT Assessment Rehab Potential (ACUTE/IP ONLY): Fair PT Barriers to Discharge: Other (comments)(level of care) PT Patient demonstrates impairments in the following area(s): Balance;Motor;Endurance PT Transfers Functional Problem(s): Bed Mobility;Bed to Chair;Car PT Locomotion Functional Problem(s): Ambulation;Wheelchair Mobility;Stairs PT Plan PT Intensity: Minimum of 1-2 x/day ,45 to 90 minutes PT Frequency: 5 out of 7 days PT Duration Estimated Length of Stay: 25-28 days PT Treatment/Interventions: Ambulation/gait training;Balance/vestibular training;Community reintegration;Discharge planning;DME/adaptive equipment instruction;Functional electrical stimulation;Functional mobility training;Neuromuscular re-education;Patient/family education;Skin care/wound management;Stair training;Therapeutic Activities;Therapeutic Exercise;UE/LE Strength taining/ROM;UE/LE Coordination activities;Wheelchair propulsion/positioning PT Transfers Anticipated Outcome(s): mod assist PT Locomotion Anticipated Outcome(s): supervision w/c level PT Recommendation Recommendations for Other Services: Therapeutic Recreation consult Follow Up Recommendations: Home health PT;24 hour supervision/assistance Patient destination: Home Equipment Recommended: To be determined  Skilled Therapeutic Intervention Pt with no c/o pain throughout session.  Session focus on initial PT evaluation and pt education in rehab process, role of PT, goals of therapy, and plan of care.  Functional mobility as described below.  Pt able to tolerate supported standing in stedy x8 minutes (limited by time of session), and complete 2 sit<>stands from  elevated stedy seat with +2 assist using sheet around hips to facilitate forward translation of pelvis over feet.  Pt positioned in w/c at end of session and educated on pressure relief and need to ask for help back to bed if feeling pain in feet or bottom.  Left upright with call bell in reach and needs met.   PT Evaluation Precautions/Restrictions Precautions Precautions: Fall Precaution Comments: wound VAC; Restrictions Weight Bearing Restrictions: No Home Living/Prior Functioning Home Living Available Help at Discharge: Family;Available 24 hours/day Type of Home: House Home Access: Stairs to enter [employer] of Steps: threshhold Entrance Stairs-Rails: None Home Layout: One level  Lives With: Spouse Prior Function Level of Independence: Requires assistive device for independence Driving: Yes Vocation: Retired Art gallery manager: Within Advertising copywriter Praxis Praxis: Intact  Cognition Overall Cognitive Status: Within Functional Limits for tasks assessed Arousal/Alertness: Awake/alert Orientation Level: Oriented X4 Memory: Appears intact Awareness: Appears intact Problem Solving: Appears intact Safety/Judgment: Appears intact Sensation Sensation Light Touch: Appears Intact(reports intact sensation to LEs) Coordination Gross Motor Movements are Fluid and Coordinated: No Fine Motor Movements are Fluid and Coordinated: No Coordination and Movement Description: decreased coordination in all extremities d/t profound weakness and rotator cuff tear in L shoulder Motor  Motor Motor: Paraplegia Motor - Skilled Clinical Observations: global weakness on top of paraparesis  Mobility Bed Mobility Bed Mobility: Supine to Sit Supine to Sit: 2 Helpers Transfers Transfers: Sit to Stand;Stand to Sit;Transfer via [place] to Stand: 2 Helpers Stand to Sit: 2 Press photographer (Assistive device): Other (Comment) Transfer via Lift  Equipment: Animal nutritionist: No Gait Gait: No Stairs / Additional Locomotion Stairs: No Wheelchair Mobility Wheelchair Mobility: No  Trunk/Postural Assessment  Cervical Assessment Cervical Assessment: Within Psychologist, forensic  Assessment Thoracic Assessment: Exceptions to WFL(rounded shoulders, mild kyphosis) Lumbar Assessment Lumbar Assessment: Within Functional Limits Postural Control Postural Control: Deficits on evaluation Trunk Control: decreased during supine>sit, but improved with static sitting EOB and supported standing in stedy  Balance Balance Balance Assessed: Yes Static Sitting Balance Static Sitting - Balance Support: Right upper extremity supported;Left upper extremity supported;Feet unsupported Static Sitting - Level of Assistance: 4: Min assist Dynamic Sitting Balance Dynamic Sitting - Balance Support: Feet unsupported;During functional activity;Left upper extremity supported;Right upper extremity supported Dynamic Sitting - Level of Assistance: 4: Min assist Extremity Assessment      RLE Assessment RLE Assessment: (not formally assessed) General Strength Comments: difficulty powering up into standing even from highly elevated surface LLE Assessment LLE Assessment: (not formally assessed) General Strength Comments: difficulty powering up to standing even from highly elevated surface   See Function Navigator for Current Functional Status.   Refer to Care Plan for Long Term Goals  Recommendations for other services: Therapeutic Recreation  Discharge Criteria: Patient will be discharged from PT if patient refuses treatment 3 consecutive times without medical reason, if treatment goals not met, if there is a change in medical status, if patient makes no progress towards goals or if patient is discharged from hospital.  The above assessment, treatment plan, treatment alternatives and goals were discussed and mutually agreed upon:  by patient  [name] 07/10/2018, 4:04 PM

## 2018-07-10 NOTE — Progress Notes (Addendum)
[place] PHYSICAL MEDICINE & REHABILITATION     PROGRESS NOTE    Subjective/Complaints: Pt states he had a reasonable night. Ready to get going with therapies. States that pain is controlled  ROS: Patient denies fever, rash, sore throat, blurred vision, nausea, vomiting, diarrhea, cough, shortness of breath or chest pain, joint or back pain, headache, or mood change.   Objective:  [place] Ekg Site Rite  Result Date: 07/08/2018 If Site Rite image not attached, placement could not be confirmed due to current cardiac rhythm.  Recent Labs    07/09/18 2003 07/10/18 0631  WBC 14.5* 11.8*  HGB 7.6* 7.2*  HCT 24.6* 22.8*  PLT 480* 453*   Recent Labs    07/08/18 0416 07/09/18 2003 07/10/18 0631  NA 139  --  136  K 4.0  --  3.9  CL 105  --  103  GLUCOSE 105*  --  104*  BUN 12  --  11  CREATININE 0.87 0.83 0.84  CALCIUM 7.7*  --  7.8*   CBG (last 3)  No results for input(s): GLUCAP in the last 72 hours.  Wt Readings from Last 3 Encounters:  07/09/18 (!) 156.9 kg  06/28/18 (!) 147.8 kg  02/16/18 (!) 144.2 kg     Intake/Output Summary (Last 24 hours) at 07/10/2018 0947 Last data filed at 07/10/2018 0730 Gross per 24 hour  Intake 220 ml  Output 1100 ml  Net -880 ml    Vital Signs: Blood pressure (!) 146/55, pulse (!) 101, temperature (!) 100.6 F (38.1 C), temperature source Axillary, resp. rate 20, height 5\' 10"  (1.778 m), weight (!) 156.9 kg, SpO2 98 %. Physical Exam:  Constitutional: No distress . Vital signs reviewed. obese HEENT: EOMI, oral membranes moist Neck: supple Cardiovascular: RRR without murmur. No JVD    Respiratory: CTA Bilaterally without wheezes or rales. Normal effort    GI: BS +, non-tender, non-distended  Neurological: He isalertand oriented to person, place, and time.  Skin: Back incision  with wound VAC sealed Motor strength is 5/5 in the right deltoid, bicep, tricep, grip Trace left deltoid left bicep,  4- tricep 4- grip--stable 3- to  3/5 right hip knee extensor synergy with plantar flexion 2/5 left hip knee extensor synergy with plantar flexion. Decreased LT in feet, senses pain and LT elsewhere   Assessment/Plan: 1. Functional and mobility deficits secondary to thoracic cord contusion and left C5 radiculopathy which require 3+ hours per day of interdisciplinary therapy in a comprehensive inpatient rehab setting. Physiatrist is providing close team supervision and 24 hour management of active medical problems listed below. Physiatrist and rehab team continue to assess barriers to discharge/monitor patient progress toward functional and medical goals.  Function:  Bathing Bathing position      Bathing parts      Bathing assist        Upper Body Dressing/Undressing Upper body dressing                    Upper body assist        Lower Body Dressing/Undressing Lower body dressing                                  Lower body assist        Toileting Toileting          Toileting assist     Transfers Chair/bed transfer  Secondary school teacher Comprehension    Expression    Social Interaction    Problem Solving    Memory      Medical Problem List and Plan: 1.Decreased functional mobilitysecondary to left C5 radiculopathy,  thoracic cord contusion with myelopathy/chronic radiculopathy-status post lumbar wound debridement drainage irrigation 9/17/2019with wound VAC placement, septic arthritis left knee MSSA bacteremia  -beginning therapies today 2. DVT Prophylaxis/Anticoagulation: Subcutaneous Lovenox.  -dopplers negative 3. Pain Management:Robaxin and hydrocodone as needed 4. Mood:Cymbalta 60 mg daily. Provide emotional support 5. Neuropsych: This patientiscapable of making decisions on hisown behalf. 6. Skin/Wound Care:Routine skin checks/wound VAC being followed by WOC RN 7.  Fluids/Electrolytes/Nutrition: encourage PO  -I personally reviewed the patient's labs today.   8.ID/MSSA bacteremia. IV Ancef 2 g every 8 hours through 08/11/2018 and stop. Follow-up infectious disease.   -continued low grade temp 9.Septic arthritis left knee. Status post arthroscopy irrigation and drainage 10.Left shoulder full-thickness retracted supraspinatus tendon tear. Conservative care 11.Acute on chronic anemia. Continue ferrous gluconate  -hgb holding around 7.2-7.6 12.BPH/urinary retention. Check PVR. Flomax 0.4 mg daily 13.Constipation. Laxative assistance   LOS (Days) 1 A FACE TO FACE EVALUATION WAS PERFORMED  Ranelle Oyster, MD 07/10/2018 9:47 AM

## 2018-07-10 NOTE — IPOC Note (Signed)
Overall Plan of Care [facility]) Patient Details Name: [name] [name] MRN: [id number] DOB: 30-May-1956  Admitting Diagnosis: Thoracic myelopathy  Hospital Problems: Principal Problem:   Thoracic myelopathy Active Problems:   Forestier's disease of thoracolumbar region   Degenerative disc disease, lumbar   Abscess in epidural space of L2-L5 lumbar spine   Left cervical radiculopathy   Complete tear of left rotator cuff   MSSA bacteremia   Effusion, left knee     Functional Problem List: Nursing Bladder, Bowel, Edema, Endurance, Motor, Pain, Skin Integrity, Sensory  PT Balance, Motor, Endurance  OT Balance, Edema, Endurance, Pain, Skin Integrity, Sensory, Safety, Motor  SLP    TR         Basic ADL's: OT Eating, Grooming, Bathing, Toileting, Dressing     Advanced  ADL's: OT       Transfers: PT Bed Mobility, Bed to Chair, IT sales professional: PT Ambulation, Psychologist, prison and probation services, Stairs     Additional Impairments: OT Fuctional Use of Upper Extremity  SLP        TR      Anticipated Outcomes Item Anticipated Outcome  Self Feeding Set up  Swallowing      Basic self-care  MIN A UB; MOD A LB dressing  Toileting  Bathing- min A; MOD A toileting   Bathroom Transfers MOD A toilet  Bowel/Bladder  Pt will manage bowel and bladder with mod assist.   Transfers  mod assist  Locomotion  supervision w/c level  Communication     Cognition     Pain  Pt will manage pain at 3 or less on a scale of 0-10.   Safety/Judgment  Pt will follow safety precautions with min assist/cues while in rehab.    Therapy Plan: PT Intensity: Minimum of 1-2 x/day ,45 to 90 minutes PT Frequency: 5 out of 7 days PT Duration Estimated Length of Stay: 25-28 days OT Intensity: Minimum of 1-2 x/day, 45 to 90 minutes OT Frequency: 5 out of 7 days OT Duration/Estimated Length of Stay: 4 weeks      Team Interventions: Nursing Interventions Patient/Family Education, Bladder Management,  Bowel Management, Disease Management/Prevention, Skin Care/Wound Management, Medication Management, Pain Management, Discharge Planning  PT interventions Ambulation/gait training, Balance/vestibular training, Community reintegration, Discharge planning, DME/adaptive equipment instruction, Functional electrical stimulation, Functional mobility training, Neuromuscular re-education, Patient/family education, Skin care/wound management, Stair training, Therapeutic Activities, Therapeutic Exercise, UE/LE Strength taining/ROM, UE/LE Coordination activities, Wheelchair propulsion/positioning  OT Interventions Balance/vestibular training, Discharge planning, Functional electrical stimulation, Pain management, Self Care/advanced ADL retraining, Therapeutic Activities, UE/LE Coordination activities, Disease mangement/prevention, Functional mobility training, Patient/family education, Skin care/wound managment, Therapeutic Exercise, DME/adaptive equipment instruction, Neuromuscular re-education, Psychosocial support, Splinting/orthotics, UE/LE Strength taining/ROM, Wheelchair propulsion/positioning, Community reintegration  SLP Interventions    TR Interventions    SW/CM Interventions Discharge Planning, Psychosocial Support, Patient/Family Education   Barriers to Discharge MD  Medical stability  Nursing      PT Other (comments)(level of care)    OT Weight    SLP      SW       Team Discharge Planning: Destination: PT-Home ,OT- Home , SLP-  Projected Follow-up: PT-Home health PT, 24 hour supervision/assistance, OT-  Home health OT, SLP-  Projected Equipment Needs: PT-To be determined, OT- Other (comment), Tub/shower bench, SLP-  Equipment Details: PT- , OT-Bariatric DAC Patient/family involved in discharge planning: PT- Patient,  OT-Patient, SLP-   MD ELOS: 21-28 days Medical Rehab Prognosis:  Excellent Assessment: The patient has  been admitted for CIR therapies with the diagnosis of thoracic  myelopathy, cervical radic, multiple medical. The team will be addressing functional mobility, strength, stamina, balance, safety, adaptive techniques and equipment, self-care, bowel and bladder mgt, patient and caregiver education, NMR, wound care, orthotics, w/c use, pain contro, community reentry. Goals have been set at min to mod assist with basic mobility and self-care at w/c level, supervision w/c mobility    Ranelle Oyster, MD, [facility]      See Team Conference Notes for weekly updates to the plan of care

## 2018-07-10 NOTE — Progress Notes (Signed)
Occupational Therapy Session Note  Patient Details  Name: [name] [name] MRN: [id number] Date of Birth: 12-04-1955  Today's Date: 07/10/2018 OT Individual Time: 2956-2130 OT Individual Time Calculation (min): 55 min    Short Term Goals: Week 1:  OT Short Term Goal 1 (Week 1): Pt will transfer to w/c MAX A of 2 to w/c in prep for [name] transfer OT Short Term Goal 2 (Week 1): Pt will don shirt MOD A OT Short Term Goal 3 (Week 1): Pt will recall hemi dressing techniques for UB dressing with min question cues OT Short Term Goal 4 (Week 1): Pt will groom at sink wiht supervision and AE PRN  Skilled Therapeutic Interventions/Progress Updates:  Pt resting in w/c upon arrival and agreeable to therapy.  OT intervention with focus on increased BUE function and edema management.  Pt with L shoulder rotator cuff tear with limited AROM (15 degrees) and minimal L hand grasp secondary to edema.  R shoulder with approx 90 degrees AROM and overall strength of 4/5. Kinesio tape applied to L hand/fingers and L knee for edema management. Unable to engaged in UB dressing tasks secondary to PICC line running.  Pt remained in w/c with all needs within reach.  Therapy Documentation Precautions:  Precautions Precautions: Back, Fall Precaution Booklet Issued: No Precaution Comments: wound VAC; Restrictions Weight Bearing Restrictions: No  Pain: Pt c/o 3/10 back pain and L shoulder pain; RN aware, meds admins, and repositioned  See Function Navigator for Current Functional Status.   Therapy/Group: Individual Therapy  [name] 07/10/2018, 2:34 PM

## 2018-07-10 NOTE — Progress Notes (Addendum)
Physical Therapy Session Note  Patient Details  Name: [name] [name] MRN: [id number] Date of Birth: 11-02-1955  Today's Date: 07/10/2018 PT Individual Time: 1430-1500 PT Individual Time Calculation (min): 30 min   Short Term Goals: Week 1:  PT Short Term Goal 1 (Week 1): Pt will demo rolling L/R with bed features and min assist PT Short Term Goal 2 (Week 1): Pt will transfer sit<>stand with +1 assist PT Short Term Goal 3 (Week 1): Pt will transfer in/out of wheelchair with LRAD and consistent +2 assist or better PT Short Term Goal 4 (Week 1): Pt will propel w/c x30' for strengthening and cardiovascular endurance   Skilled Therapeutic Interventions/Progress Updates:   Pt in w/c and agreeable to therapy, denies pain but reports increased fatigue. Session focused on functional standing, standing tolerance, and bed mobility. Requesting to return to supine. Performed transfer back to EOB via stedy. Attempted multiple sit<>stands from w/c, +2, but ultimately needed +3 to boost into standing. Unable to reach full upright w/ manual and verbal cues to increase trunk and hip extension. Transferred to supine w/ max assist +2. Total assist +2 to reposition in supine, however pt able to initiate appropriate technique using UEs and LEs to boost up. Total assist to take off LE garments per pt's request. Ended session in supine, call bell within reach and all needs met.   Therapy Documentation Precautions:  Precautions Precautions: Fall Precaution Booklet Issued: No Precaution Comments: wound VAC; Restrictions Weight Bearing Restrictions: No  See Function Navigator for Current Functional Status.   Therapy/Group: Individual Therapy  Tannya Gonet Clent Demark 07/10/2018, 3:44 PM

## 2018-07-10 NOTE — Evaluation (Signed)
Occupational Therapy Assessment and Plan  Patient Details  Name: [name] [name] MRN: [id number] Date of Birth: 12/24/1955  OT Diagnosis: abnormal posture, lumbago (low back pain), muscle weakness (generalized), swelling of limb and spinal cord involment  throughout cervical, thoracic and lumbar spine Rehab Potential:   ELOS: 4 weeks   Today's Date: 07/10/2018 OT Individual Time: 5397-6734 OT Individual Time Calculation (min): 60 min     Problem List:  Patient Active Problem List   Diagnosis Date Noted  . Abscess in epidural space of lumbar spine   . Effusion, left knee   . Pain and swelling of wrist, left   . Complete tear of left rotator cuff 07/03/2018    Class: Chronic  . AKI (acute kidney injury) ([place])   . MSSA bacteremia   . Septic arthritis of knee, left ([place]) 06/30/2018    Class: Acute  . Leukocytosis   . Postoperative seroma involving nervous system after nervous system procedure   . Shoulder pain, bilateral   . Thoracic myelopathy 06/28/2018  . Spondylosis, thoracic, with myelopathy 06/28/2018    Class: Acute  . Abscess in epidural space of L2-L5 lumbar spine 06/28/2018    Class: Acute  . Acute urinary retention 06/28/2018  . Left cervical radiculopathy 06/28/2018    Class: Acute  . Spinal stenosis of lumbar region 11/17/2017    Class: Chronic  . Status post lumbar laminectomy 11/17/2017  . Lumbar stenosis with neurogenic claudication 08/18/2017  . Forestier's disease of thoracolumbar region 08/18/2017  . Degenerative disc disease, lumbar 08/18/2017  . Lumbar radiculopathy 09/30/2016  . Morbid (severe) obesity due to excess calories ([place]) 09/30/2016  . Biliary calculus with acute cholecystitis 03/21/2016  . Acute pyelonephritis 12/28/2013  . Ureteral stone with hydronephrosis 12/28/2013  . Acute kidney failure ([place]) 12/27/2013  . Severe sepsis with acute organ dysfunction ([place]) 12/27/2013  . Hypotension 12/27/2013  . Lactic acidosis 12/27/2013  . Sepsis  secondary to UTI ([place]) 12/27/2013  . EMPYEMA CHEST 08/30/2008  . PRURIGO 08/30/2008  . OBSTRUCTIVE SLEEP APNEA 08/30/2008    Past Medical History:  Past Medical History:  Diagnosis Date  . Anemia   . Arthritis   . Chronic kidney disease    HX acute kidney failure / acute pyelonephritis / hydronephrosis / severe sepsis per discharge summary 12/27/13  . History of kidney stones   . Hypertension   . Morbid obesity ([place])   . Obstructive sleep apnea    does not need c pap since 110 lb wt loss  . Osteoporosis   . Prurigo 2002  . Scars    ON ARMS FROM CHEMICAL EXPLOSION 1999  . Spinal stenosis    Past Surgical History:  Past Surgical History:  Procedure Laterality Date  . BACK SURGERY  01/23/2010   lumbar  . CHOLECYSTECTOMY N/A 03/24/2016   Procedure: LAPAROSCOPIC CHOLECYSTECTOMY WITH INTRAOPERATIVE CHOLANGIOGRAM;  Surgeon: Autumn Messing III, MD;  Location: [facility];  Service: General;  Laterality: N/A;  . CIRCUMCISION    . CYSTOSCOPY WITH RETROGRADE PYELOGRAM, URETEROSCOPY AND STENT PLACEMENT Left 01/20/2014   Procedure: CYSTOSCOPY WITH RETROGRADE PYELOGRAM, URETEROSCOPY AND STENT EXCHANGE;  Surgeon: Molli Hazard, MD;  Location: [facility];  Service: Urology;  Laterality: Left;  bugbee bladder fulguration  . CYSTOSCOPY WITH STENT PLACEMENT Left 12/28/2013   Procedure: CYSTOSCOPY WITH STENT PLACEMENT left retrograde pyleogram;  Surgeon: Molli Hazard, MD;  Location: [facility];  Service: Urology;  Laterality: Left;  . ESOPHAGOGASTRODUODENOSCOPY N/A 12/07/2012   Procedure: ESOPHAGOGASTRODUODENOSCOPY (EGD);  Surgeon:  Madilyn Hook, DO;  Location: [facility];  Service: General;  Laterality: N/A;  . HOLMIUM LASER APPLICATION Left 10/22/4172   Procedure: HOLMIUM LASER APPLICATION;  Surgeon: Molli Hazard, MD;  Location: [facility];  Service: Urology;  Laterality: Left;  . KNEE ARTHROSCOPY Left 06/29/2018   Procedure: ARTHROSCOPY KNEE;  Surgeon: Jessy Oto, MD;  Location: [place];  Service:  Orthopedics;  Laterality: Left;  . LAPAROSCOPIC GASTRIC SLEEVE RESECTION N/A 12/07/2012   Procedure: LAPAROSCOPIC GASTRIC SLEEVE RESECTION;  Surgeon: Madilyn Hook, DO;  Location: [facility];  Service: General;  Laterality: N/A;  laparoscopic sleeve gastrectomy with EGD  . LUMBAR LAMINECTOMY N/A 11/17/2017   Procedure: L2-3 LAMINECTOMY AND REDO LAMINECTOMIES  L3-4, L4-5 AND L5-S1;  Surgeon: Jessy Oto, MD;  Location: [place];  Service: Orthopedics;  Laterality: N/A;  . LUMBAR WOUND DEBRIDEMENT N/A 06/29/2018   Procedure: LUMBAR WOUND DEBRIDEMENT DRAINAGE AND IRRIGATION; AND ASPIRATION OF LEFT KNEE;  Surgeon: Jessy Oto, MD;  Location: [place];  Service: Orthopedics;  Laterality: N/A;  . TEE WITHOUT CARDIOVERSION N/A 07/01/2018   Procedure: TRANSESOPHAGEAL ECHOCARDIOGRAM (TEE);  Surgeon: Larey Dresser, MD;  [facility];  Service: Cardiovascular;  Laterality: N/A;    Assessment & Plan Clinical Impression: [name] [name] a 62 year old right-handed male admitted 06/28/2018 with progressive lower extremity weakness as well as falls. Per chart review patient lives with spouse. Needed assistance for ADLs prior to admission ambulating short distances with a rolling walker and was receiving outpatient therapies. Noted history of lumbar multilevel central laminectomy for recurrent lumbar spinal stenosis with multiple back surgeries and resultant foot drop. MRI thoracic lumbar spine showed no fracture or malalignment. Subcentimeter cord edema T7-8 no syrinx. Moderate to severe paraspinal muscle atrophy. Multilevel neuroforaminal narrowing severe at T2-3, T9-10 and T10-11 as well as a 2.9 x 4.3 x 6.1 fluid collection right L4-5 facet effusion. Patient underwent lumbar wound debridement drainage irrigation 06/29/2018 per Dr. Louanne Skye as well as aspiration of left knee consistent with septic arthritis. Aspiration lumbar abscess positive for Staphylococcus aureus. Infectious disease consulted. TEE  completed negative for vegetation. Placed on intravenous Ancef per infectious disease and plan to continue through 08/11/2018. Nephrology consulted for elevated creatinine 2.43 and renal ultrasound showed no hydronephrosis. Placed on gentle IV fluids creatinine has improved to 0.87. Patient with complaints of left shoulder pain MRI completed showing full-thickness retracted supraspinatus tendon tear with current conservative care recommended. A follow-up MRI cervical spine completed for neck pain as well as left bicep weakness showing neuroforaminal narrowing C2-3 through C6-7. Severe from C3-4 through C6-7. Current plans to follow-up outpatient neurosurgery Dr. Annette Stable. Patient also with persistent left wrist pain as well as swelling. X-rays of left wrist 06/29/2018 showing extensive soft tissue swelling over the dorsum of the wrist without underlying fracture. CT of wrist ordered 07/08/2018 showed no evidence of septic joint or osteomyelitis. Nonspecific soft tissue edema without focal fluid collection. Patient currently on subcutaneous Lovenox for DVT prophylaxis. Wound care nurse follow-up for surgical lumbar incision with wound VAC dressing changes as recommended. Patient with bouts of urinary retention working with voiding trials and maintained on Flomax. Patient is tolerating a regular diet. Therapy evaluations completed with recommendations of physical medicine rehab consult. Patient was admitted for a comprehensive rehab program.  Patient currently requires total +2 with basic self-care skills secondary to muscle weakness, decreased cardiorespiratoy endurance, abnormal tone, unbalanced muscle activation and decreased coordination, decreased safety awareness and decreased sitting balance, decreased standing balance, decreased postural control  and decreased balance strategies.  Prior to hospitalization, patient could complete BADL with min (per chart review, pt reporting MOD I).  Patient will  benefit from skilled intervention to decrease level of assist with basic self-care skills and increase independence with basic self-care skills prior to discharge home with care partner.  Anticipate patient will require 24 hour supervision and moderate physical assestance and follow up home health.  OT - End of Session Activity Tolerance: Tolerates 30+ min activity with multiple rests Endurance Deficit: Yes OT Assessment Rehab Potential (ACUTE ONLY): Fair OT Barriers to Discharge: Weight OT Patient demonstrates impairments in the following area(s): Balance;Edema;Endurance;Pain;Skin Integrity;Sensory;Safety;Motor OT Basic ADL's Functional Problem(s): Eating;Grooming;Bathing;Toileting;Dressing OT Transfers Functional Problem(s): Toilet OT Additional Impairment(s): Fuctional Use of Upper Extremity OT Plan OT Intensity: Minimum of 1-2 x/day, 45 to 90 minutes OT Frequency: 5 out of 7 days OT Duration/Estimated Length of Stay: 4 weeks OT Treatment/Interventions: Balance/vestibular training;Discharge planning;Functional electrical stimulation;Pain management;Self Care/advanced ADL retraining;Therapeutic Activities;UE/LE Coordination activities;Disease mangement/prevention;Functional mobility training;Patient/family education;Skin care/wound managment;Therapeutic Exercise;DME/adaptive equipment instruction;Neuromuscular re-education;Psychosocial support;Splinting/orthotics;UE/LE Strength taining/ROM;Wheelchair propulsion/positioning;Community reintegration OT Self Feeding Anticipated Outcome(s): Set up OT Basic Self-Care Anticipated Outcome(s): MIN A UB; MOD A LB dressing OT Toileting Anticipated Outcome(s): Bathing- min A; MOD A toileting OT Bathroom Transfers Anticipated Outcome(s): MOD A toilet OT Recommendation Patient destination: Home Follow Up Recommendations: Home health OT Equipment Recommended: Other (comment);Tub/shower bench Equipment Details: Bariatric [facility]   Skilled Therapeutic  Intervention 1:1. Pt educated on role/purpose of OT, CIR, ELOS and POC. Pt completes bathing with total A for LB rolling B directions with use of bed rail and VC for techniwue for OT to complete posterior hygiene. Pt completes LB dressing total A with OT threading BLE into pants and donning non slip socks. Pt unable to bridge hips at this time and OT advances pants rolling B as stated above. Pt washes UB with HOH A to wash RUE with LUE d/t decreased ROM/strength in LUE d/t rotator cuff tear. Pt unable to don shirt at this time d/t PICC running. RN alerted as pt wound vac dressing was torn. OT selects bariatric [facility] for safety. Exited session with RN chanign dressing  OT Evaluation Precautions/Restrictions  Precautions Precautions: Back;Fall Precaution Booklet Issued: No Precaution Comments: wound VAC; Restrictions Weight Bearing Restrictions: No General   Vital Signs  Pain Pain Assessment Pain Scale: 0-10 Pain Score: 5  Pain Location: Knee Pain Orientation: Left Home Living/Prior Functioning Home Living Family/patient expects to be discharged to:: Private residence Living Arrangements: Spouse/significant other Available Help at Discharge: Family, Available 24 hours/day Type of Home: House Home Access: Level entry Entrance Stairs-Number of Steps: 1 Home Layout: One level Bathroom Shower/Tub: Multimedia programmer: Handicapped height Additional Comments: shower chair IADL History Homemaking Responsibilities: Yes Meal Prep Responsibility: Secondary Laundry Responsibility: Secondary Cleaning Responsibility: Secondary Bill Paying/Finance Responsibility: Secondary Shopping Responsibility: Secondary Current License: Yes Mode of Transportation: Car Type of Occupation: deep sea fishing; hiking Prior Function Level of Independence: Independent with basic ADLs Driving: Yes Vocation: Retired ADL   Vision Baseline Vision/History: Wears glasses Wears Glasses: Reading  only Patient Visual Report: No change from baseline Vision Assessment?: No apparent visual deficits Perception    Praxis   Cognition Overall Cognitive Status: Within Functional Limits for tasks assessed Arousal/Alertness: Awake/alert Orientation Level: Person;Place;Situation Person: Oriented Place: Oriented Situation: Oriented Year: 2019 Month: September Day of Week: Correct Immediate Memory Recall: Bed;Blue;Sock Memory Recall: Sock;Blue;Bed Memory Recall Sock: With Cue Memory Recall Blue: With Cue Memory Recall Bed: Without Cue Safety/Judgment:  Appears intact Sensation Sensation Light Touch: Impaired by gross assessment Hot/Cold: Impaired Detail Hot/Cold Impaired Details: Absent LLE;Absent RLE Coordination Gross Motor Movements are Fluid and Coordinated: No Fine Motor Movements are Fluid and Coordinated: No Motor  Motor Motor: Paraplegia Motor - Skilled Clinical Observations: generalized weakness in all 4 limbs; reports spasms in LLE Mobility  Bed Mobility Bed Mobility: Rolling Right;Rolling Left;Supine to Sit;Sit to Supine Rolling Right: Moderate Assistance - Patient 50-74% Rolling Left: Maximal Assistance - Patient 25-49% Supine to Sit: 2 Helpers Sit to Supine: 2 Helpers  Trunk/Postural Assessment  Cervical Assessment Cervical Assessment: Exceptions to WFL(head forward) Thoracic Assessment Thoracic Assessment: Exceptions to WFL(rounded shoulders) Lumbar Assessment Lumbar Assessment: Exceptions to WFL(posterior pelvic tilt) Postural Control Postural Control: Deficits on evaluation(insufficient)  Balance Balance Balance Assessed: Yes Static Sitting Balance Static Sitting - Level of Assistance: 5: Stand by assistance Static Sitting - Comment/# of Minutes: requires mod A with perturbations LOB to R Extremity/Trunk Assessment RUE Assessment RUE Assessment: Exceptions to [facility] Active Range of Motion (AROM) Comments: 0-100 shoulder flex/ab, WNL elbow, wrist  digit General Strength Comments: generalized weakness LUE Assessment LUE Assessment: Exceptions to [facility] Active Range of Motion (AROM) Comments: 0-20 shoulder d/t rotator cuff tear, elbow, wrist and digit WNL exxcept unable to opose little finger to thumb General Strength Comments: generalized weakness   See Function Navigator for Current Functional Status.   Refer to Care Plan for Long Term Goals  Recommendations for other services: None    Discharge Criteria: Patient will be discharged from OT if patient refuses treatment 3 consecutive times without medical reason, if treatment goals not met, if there is a change in medical status, if patient makes no progress towards goals or if patient is discharged from hospital.  The above assessment, treatment plan, treatment alternatives and goals were discussed and mutually agreed upon: by patient  Tonny Branch 07/10/2018, 10:03 AM

## 2018-07-10 NOTE — Progress Notes (Signed)
Initial Nutrition Assessment  DOCUMENTATION CODES:   Morbid obesity  INTERVENTION:   -Continue Ensure Enlive po BID, each supplement provides 350 kcal and 20 grams of protein  -Continue MVI with minerals daily  NUTRITION DIAGNOSIS:   Increased nutrient needs related to wound healing, post-op healing as evidenced by estimated needs.  GOAL:   Patient will meet greater than or equal to 90% of their needs  MONITOR:   PO intake, Supplement acceptance, Labs, Weight trends, Skin, I & O's  REASON FOR ASSESSMENT:   Malnutrition Screening Tool    ASSESSMENT:   [name] [name] is a 62 year old right-handed male admitted 06/28/2018 with progressive lower extremity weakness as well as falls  Pt admitted to CIR for decreased functional weakness.   9/15- s/p paraspinal fluid collection 9/16- s/p lumbar wound debridement drainage and irrigation, aspiration of lt knee, and arthroscopy of lt knee utilizing portals for irrigation and drainage of lt knee pyarthrosis  9/18- s/p TEE  Reviewed I/O's: -1 L x 24 hours  Pt familiar to this RD due to hospital admission.   Spoke with pt at bedside, who is in good spirits today. He shares that his appetite has improved greatly this week. Observed lunch meal- pt consumed about 75% of lunch time meal. Meal completion 50%. He is also consuming Ensure supplements per his report.   Reviewed wt hx; noted slight weight increase, likely related to fluid retention.   Discussed with pt importance of good meal and supplement intake to promote healing and progression with therapy. Pt amenable to continue Ensure supplements.   Labs reviewed.   NUTRITION - FOCUSED PHYSICAL EXAM:    Most Recent Value  Orbital Region  No depletion  Upper Arm Region  No depletion  Thoracic and Lumbar Region  No depletion  Buccal Region  No depletion  Temple Region  No depletion  Clavicle Bone Region  No depletion  Clavicle and Acromion Bone Region  No depletion  Scapular  Bone Region  No depletion  Dorsal Hand  No depletion  Patellar Region  No depletion  Anterior Thigh Region  No depletion  Posterior Calf Region  No depletion  Edema (RD Assessment)  Mild  Hair  Reviewed  Eyes  Reviewed  Mouth  Reviewed  Skin  Reviewed  Nails  Reviewed       Diet Order:   Diet Order            Diet regular Room service appropriate? Yes; Fluid consistency: Thin  Diet effective now              EDUCATION NEEDS:   Education needs have been addressed  Skin:  Skin Assessment: Skin Integrity Issues: Skin Integrity Issues:: Incisions, Wound VAC Wound Vac: back Incisions: lt knee  Last BM:  07/10/18  Height:   Ht Readings from Last 1 Encounters:  07/09/18 5\' 10"  (1.778 m)    Weight:   Wt Readings from Last 1 Encounters:  07/09/18 (!) 156.9 kg    Ideal Body Weight:  75.4 kg  BMI:  Body mass index is 49.63 kg/m.  Estimated Nutritional Needs:   Kcal:  2200-2400  Protein:  125-150 grams  Fluid:  > 2.2 L    Deoni Cosey A. Mayford Knife, RD, LDN, CDE Pager: [phone] After hours Pager: [phone]

## 2018-07-10 NOTE — Consult Note (Addendum)
WOC Nurse wound consult note Reason for Consult:NEgative pressure (VAC) therapy to lumbar spine.  Changes Mon/Wed/Fri. Connected to hospital device. Patient states his home device is at home.  Wound type:Nonhealing surgical wound Pressure Injury POA: NA Measurement: 14 cm x 6 cm x 7.3 cm  Wound ZOX:WRUEA red Drainage (amount, consistency, odor) Heavy serosanguinous drainage.  Canister is changed at this time.  Old canister was full.  Periwound:Intact.  Dressing bridged to right flank. Dressing procedure/placement/frequency: 2 pieces black foam removed from wound bed.  1 piece black foam removed from bridge to left flank. Cleansed with NS.  New dressing:  1 piece black foam to wound bed.  Another piece black foam for bridge. Covered with drape  Immediately achieved seal.  WOC team will follow.  Maple Hudson MSN, RN, FNP-BC CWON Wound, Ostomy, Continence Nurse Pager [phone]

## 2018-07-10 NOTE — Discharge Instructions (Signed)
Inpatient Rehab Discharge Instructions  [name] [name] Discharge date and time: No discharge date for patient encounter.   Activities/Precautions/ Functional Status: Activity: activity as tolerated Diet: regular diet Wound Care: keep wound clean and dry Functional status:  ___ No restrictions     ___ Walk up steps independently ___ 24/7 supervision/assistance   ___ Walk up steps with assistance ___ Intermittent supervision/assistance  ___ Bathe/dress independently ___ Walk with walker     _x__ Bathe/dress with assistance ___ Walk Independently    ___ Shower independently ___ Walk with assistance    ___ Shower with assistance ___ No alcohol     ___ Return to work/school ________  Special Instructions: Continue intravenous Ancef 2 g every 8 hours through 08/11/2018 and stop   My questions have been answered and I understand these instructions. I will adhere to these goals and the provided educational materials after my discharge from the hospital.  Patient/Caregiver Signature _______________________________ Date __________  Clinician Signature _______________________________________ Date __________  Please bring this form and your medication list with you to all your follow-up doctor's appointments.

## 2018-07-11 ENCOUNTER — Inpatient Hospital Stay (HOSPITAL_COMMUNITY): Payer: No Typology Code available for payment source | Admitting: Occupational Therapy

## 2018-07-11 ENCOUNTER — Inpatient Hospital Stay (HOSPITAL_COMMUNITY): Payer: No Typology Code available for payment source | Admitting: Physical Therapy

## 2018-07-11 NOTE — Progress Notes (Signed)
Physical Therapy Session Note  Patient Details  Name: [name] [name] MRN: [id number] Date of Birth: 01-28-1956  Today's Date: 07/11/2018 PT Individual Time: 0800-0857 PT Individual Time Calculation (min): 57 min   Short Term Goals: Week 1:  PT Short Term Goal 1 (Week 1): Pt will demo rolling L/R with bed features and min assist PT Short Term Goal 2 (Week 1): Pt will transfer sit<>stand with +1 assist PT Short Term Goal 3 (Week 1): Pt will transfer in/out of wheelchair with LRAD and consistent +2 assist or better PT Short Term Goal 4 (Week 1): Pt will propel w/c x30' for strengthening and cardiovascular endurance   Skilled Therapeutic Interventions/Progress Updates:   Pt in supine and agreeable to therapy, denies pain. Session focused on OOB tolerance, global strengthening, and aerobic conditioning. Pt performed rolling in both directions w/ min assist to manually place LEs, placed maxisky sling and transferred to w/c via [facility] for time management. Instructed pt on w/c mobility using BUEs and weight shifting anteriorly. Pt self-propelled w/c in multiple 25-50' bouts to/from gym w/ supervision to work on UE strengthening and endurance. Min manual assist at turns 2/2 LUE weakness. Transferred to/from edge of mat via maxisky. Worked on maintaining static sitting w/o back support, able to do so w/ close supervision for 20+ minutes. Performed strengthening exercises including ankle pumps 2x10, LAQs 3x10, and UE push ups 3x5. Unable to clear any aspect of bottom using UEs and LEs to push up from seated position, but able to activate correct musculature. Returned to room via w/c. Ended session resting comfortably in w/c, call bell in reach and all needs met.   Therapy Documentation Precautions:  Precautions Precautions: Fall Precaution Booklet Issued: No Precaution Comments: wound VAC; Restrictions Weight Bearing Restrictions: No  See Function Navigator for Current Functional  Status.   Therapy/Group: Individual Therapy  Odester Nilson K Rebekha Diveley 07/11/2018, 8:59 AM

## 2018-07-11 NOTE — Progress Notes (Signed)
Occupational Therapy Session Note  Patient Details  Name: [name] [name] MRN: [id number] Date of Birth: [birth year]  Today's Date: 07/11/2018 OT Individual Time: 1100-1210 and 1420-1430 OT Individual Time Calculation (min): 70 min  and 10 min  Today's Date: 07/11/2018 OT Missed Time: 50 Minutes Missed Time Reason: Pain   Short Term Goals: Week 1:  OT Short Term Goal 1 (Week 1): Pt will transfer to w/c MAX A of 2 to w/c in prep for [facility] transfer OT Short Term Goal 2 (Week 1): Pt will don shirt MOD A OT Short Term Goal 3 (Week 1): Pt will recall hemi dressing techniques for UB dressing with min question cues OT Short Term Goal 4 (Week 1): Pt will groom at sink wiht supervision and AE PRN  Skilled Therapeutic Interventions/Progress Updates:    Session 1:  Upon entering the room, pt seated in wheelchair awaiting therapist with wife present in room. OT assisted pt to sink via wheelchair. Pt able to complete grooming tasks such as shaving and brushing teeth with min A for forward weight shift to obtain needed items from back of sink. Pt bathing UB with focus on using B UEs. Pt did need hand over hand assistance to wash R UE with L UE thoroughly. OT threading pants onto B feet and use of STEDY to attempt standing. Pt unable to clear bottom with 5 attempts made to stand into STEDY with sheet under hips to pull forwards as well. Maximove utilized to transfer pt back to bed. Rolling L <> R with min A and therapist washing pt's buttocks for hygiene. Total A to pull pants over B hips. Pt remaining in bed at end of session. Call bell and all needed items within reach upon exiting the room.   Session 2: Upon entering the room, NT present and finishing scheduled cath. Pt visibility shaking and grimacing and reports increased pain in back. RN notified and pain medication given this session. Pt declines OT intervention secondary to increased pain level. OT repositions pt in bed with all needs within reach.    Therapy Documentation Precautions:  Precautions Precautions: Fall Precaution Booklet Issued: No Precaution Comments: wound VAC; Restrictions Weight Bearing Restrictions: No General: General OT Amount of Missed Time: 65 Minutes Vital Signs: Therapy Vitals Temp: 98.4 F (36.9 C) Pulse Rate: 83 Resp: 20 BP: (!) 129/95 Patient Position (if appropriate): Lying Oxygen Therapy SpO2: (!) 81 % O2 Device: Room Air  See Function Navigator for Current Functional Status.   Therapy/Group: Individual Therapy  Alen Bleacher 07/11/2018, 2:42 PM

## 2018-07-11 NOTE — Progress Notes (Signed)
[place] PHYSICAL MEDICINE & REHABILITATION     PROGRESS NOTE    Subjective/Complaints: Patient sore from therapy.  Low back and left knee in particular are problematic but he is happy with therapy thus far  ROS: Patient denies fever, rash, sore throat, blurred vision, nausea, vomiting, diarrhea, cough, shortness of breath or chest pain, joint or back pain, headache, or mood change.    Objective:  No results found. Recent Labs    07/09/18 2003 07/10/18 0631  WBC 14.5* 11.8*  HGB 7.6* 7.2*  HCT 24.6* 22.8*  PLT 480* 453*   Recent Labs    07/09/18 2003 07/10/18 0631  NA  --  136  K  --  3.9  CL  --  103  GLUCOSE  --  104*  BUN  --  11  CREATININE 0.83 0.84  CALCIUM  --  7.8*   CBG (last 3)  No results for input(s): GLUCAP in the last 72 hours.  Wt Readings from Last 3 Encounters:  07/09/18 (!) 156.9 kg  06/28/18 (!) 147.8 kg  02/16/18 (!) 144.2 kg     Intake/Output Summary (Last 24 hours) at 07/11/2018 0825 Last data filed at 07/11/2018 0119 Gross per 24 hour  Intake 580 ml  Output 1200 ml  Net -620 ml    Vital Signs: Blood pressure (!) 144/81, pulse 93, temperature 98.2 F (36.8 C), temperature source Oral, resp. rate 19, height 5\' 10"  (1.778 m), weight (!) 156.9 kg, SpO2 96 %. Physical Exam:  Constitutional: No distress . Vital signs reviewed.  Obese HEENT: EOMI, oral membranes moist Neck: supple Cardiovascular: RRR without murmur. No JVD    Respiratory: CTA Bilaterally without wheezes or rales. Normal effort    GI: BS +, non-tender, non-distended  Neurological: He isalertand oriented to person, place, and time.  Skin: Back incision  with wound VAC sealed Musc: Left knee with effusion and tenderness Motor strength is 5/5 in the right deltoid, bicep, tricep, grip Trace left deltoid left bicep,  4- tricep 4- grip--stable 3- to 3/5 right hip knee extensor synergy with plantar flexion 2/5 left hip knee extensor synergy with plantar  flexion. Decreased LT in feet, senses pain and LT elsewhere--stable motor and sensory exam   Assessment/Plan: 1. Functional and mobility deficits secondary to thoracic cord contusion and left C5 radiculopathy which require 3+ hours per day of interdisciplinary therapy in a comprehensive inpatient rehab setting. Physiatrist is providing close team supervision and 24 hour management of active medical problems listed below. Physiatrist and rehab team continue to assess barriers to discharge/monitor patient progress toward functional and medical goals.  Function:  Bathing Bathing position   Position: Bed  Bathing parts Body parts bathed by patient: Left arm, Chest, Abdomen Body parts bathed by helper: Front perineal area, Buttocks, Right upper leg, Left upper leg, Right lower leg, Left lower leg, Back  Bathing assist Assist Level: 2 helpers      Upper Body Dressing/Undressing Upper body dressing   What is the patient wearing?: Hospital gown(PICC connected)                Upper body assist        Lower Body Dressing/Undressing Lower body dressing   What is the patient wearing?: Underwear, Pants, Non-skid slipper socks   Underwear - Performed by helper: Thread/unthread right underwear leg, Thread/unthread left underwear leg, Pull underwear up/down   Pants- Performed by helper: Thread/unthread right pants leg, Thread/unthread left pants leg, Pull pants up/down   Non-skid  slipper socks- Performed by helper: Don/doff right sock, Don/doff left sock                  Lower body assist Assist for lower body dressing: 2 Helpers      Toileting Toileting Toileting activity did not occur: No continent bowel/bladder event        Toileting assist     Transfers Chair/bed Optician, dispensing          Cognition Comprehension Comprehension assist level: Follows basic conversation/direction with no assist  Expression  Expression assist level: Expresses basic needs/ideas: With no assist  Social Interaction Social Interaction assist level: Interacts appropriately with others - No medications needed.  Problem Solving    Memory      Medical Problem List and Plan: 1.Decreased functional mobilitysecondary to left C5 radiculopathy,  thoracic cord contusion with myelopathy/chronic radiculopathy-status post lumbar wound debridement drainage irrigation 9/17/2019with wound VAC placement, septic arthritis left knee MSSA bacteremia  -Continue therapies 2. DVT Prophylaxis/Anticoagulation: Subcutaneous Lovenox.  -dopplers negative 3. Pain Management:Robaxin and hydrocodone as needed 4. Mood:Cymbalta 60 mg daily. Provide emotional support 5. Neuropsych: This patientiscapable of making decisions on hisown behalf. 6. Skin/Wound Care:Routine skin checks/wound VAC being followed by WOC RN 7. Fluids/Electrolytes/Nutrition: encourage PO  -Encourage p.o. 8.ID/MSSA bacteremia. IV Ancef 2 g every 8 hours through 08/11/2018 and stop. Follow-up infectious disease.   -continued low grade temp but white blood cell count slightly decreased yesterday 9.Septic arthritis left knee. Status post arthroscopy irrigation and drainage 10.Left shoulder full-thickness retracted supraspinatus tendon tear. Conservative care 11.Acute on chronic anemia. Continue ferrous gluconate  -hgb holding around 7.2-7.6 12.BPH/urinary retention. Check PVR. Flomax 0.4 mg daily 13.Constipation. Laxative assistance   LOS (Days) 2 A FACE TO FACE EVALUATION WAS PERFORMED  Ranelle Oyster, MD 07/11/2018 8:25 AM

## 2018-07-12 ENCOUNTER — Inpatient Hospital Stay (HOSPITAL_COMMUNITY): Payer: No Typology Code available for payment source

## 2018-07-12 ENCOUNTER — Inpatient Hospital Stay (HOSPITAL_COMMUNITY): Payer: No Typology Code available for payment source | Admitting: Physical Therapy

## 2018-07-12 DIAGNOSIS — M7989 Other specified soft tissue disorders: Secondary | ICD-10-CM

## 2018-07-12 NOTE — Progress Notes (Signed)
Physical Therapy Session Note  Patient Details  Name: [name] [name] MRN: [id number] Date of Birth: 1956/03/01  Today's Date: 07/12/2018 PT Individual Time: 1410-1435 PT Individual Time Calculation (min): 25 min  and Today's Date: 07/12/2018 PT Missed Time: 35 Minutes Missed Time Reason: Unavailable (Comment);Pain(missed 25 min when pt was off unit for imaging, missed 10 min 2/2 pain)  Short Term Goals: Week 1:  PT Short Term Goal 1 (Week 1): Pt will demo rolling L/R with bed features and min assist PT Short Term Goal 2 (Week 1): Pt will transfer sit<>stand with +1 assist PT Short Term Goal 3 (Week 1): Pt will transfer in/out of wheelchair with LRAD and consistent +2 assist or better PT Short Term Goal 4 (Week 1): Pt will propel w/c x30' for strengthening and cardiovascular endurance   Skilled Therapeutic Interventions/Progress Updates:   Pt missed 25 min of skilled PT at beginning of session, off unit for imaging. Missed 10 min 2/2 pain. Pt sweating, grimacing, and shaking 2/2 pain in L knee upon therapist's arrival. Provided skilled assist w/ repositioning in bed to minimize pain and to promote decreased swelling in L knee. Wrapped LLE w/ ACE wraps for swelling management and pain relief as well. Educated pt on clinical reasoning behind rest, ice, compression, and elevation as well as bed positioning. Ended session in supine, ice applied to L knee w/ some pain relief. RN made aware of pain. All needs met, pt appreciative of assistance and education.    Therapy Documentation Precautions:  Precautions Precautions: Fall Precaution Booklet Issued: No Precaution Comments: wound VAC; Restrictions Weight Bearing Restrictions: No General: PT Amount of Missed Time (min): 35 Minutes PT Missed Treatment Reason: Unavailable (Comment);Pain(missed 25 min when pt was off unit for imaging, missed 10 min 2/2 pain) Pain: Pain Assessment Pain Scale: 0-10 Pain Score: 8  Pain Type: Acute pain Pain  Location: Knee Pain Orientation: Left Pain Descriptors / Indicators: Grimacing;Guarding;Sharp;Aching Pain Onset: On-going Pain Intervention(s): Cold applied;Repositioned;RN made aware  See Function Navigator for Current Functional Status.   Therapy/Group: Individual Therapy  [name] [name] 07/12/2018, 2:39 PM

## 2018-07-12 NOTE — Progress Notes (Signed)
Pt started running a fever this evening of 101.0 oral. Left arm feels slightly warmer than the right. Dr. Riley Kill was contacted and updated about fever. MD wants to monitor pt closely, make pt comfortable and treat off and on fever with tylenol as ordered. Pt denies any pain or abnormal feelings. Stated that he does not even feel warm. Tylenol will be given.  136/60 lower right leg, 102 pulse, 20 resp, 97%RA.

## 2018-07-12 NOTE — Progress Notes (Signed)
VASCULAR LAB PRELIMINARY  PRELIMINARY  PRELIMINARY  PRELIMINARY  Bilateral lower extremity venous duplex completed.    Preliminary report:  There is no obvious evidence of DVT or SVT noted in the visualized veins of the bilateral lower extremities.   Chanese Hartsough, RVT 07/12/2018, 1:37 PM

## 2018-07-12 NOTE — Progress Notes (Signed)
Inpatient Rehabilitation Center Individual Statement of Services  Patient Name:  [name] [name]  Date:  07/12/2018  Welcome to the Inpatient Rehabilitation Center.  Our goal is to provide you with an individualized program based on your diagnosis and situation, designed to meet your specific needs.  With this comprehensive rehabilitation program, you will be expected to participate in at least 3 hours of rehabilitation therapies Monday-Friday, with modified therapy programming on the weekends.  Your rehabilitation program will include the following services:  Physical Therapy (PT), Occupational Therapy (OT), 24 hour per day rehabilitation nursing, Neuropsychology, Case Management (Social Worker), Rehabilitation Medicine, Nutrition Services and Pharmacy Services  Weekly team conferences will be held on Tuesdays to discuss your progress.  Your Social Worker will talk with you frequently to get your input and to update you on team discussions.  Team conferences with you and your family in attendance may also be held.  Expected length of stay: 25 to 28 days  Overall anticipated outcome: Minimal assistance for bed mobility, bathing, and upper body dressing; Moderate assistance for lower body dressing, toileting, and transfers  Depending on your progress and recovery, your program may change. Your Social Worker will coordinate services and will keep you informed of any changes. Your Social Worker's name and contact numbers are listed  below.  The following services may also be recommended but are not provided by the Inpatient Rehabilitation Center:   Driving Evaluations  Home Health Rehabiltiation Services  Outpatient Rehabilitation Services   Arrangements will be made to provide these services after discharge if needed.  Arrangements include referral to agencies that provide these services.  Your insurance has been verified to be:  [employer] Your primary doctor is:  Dr. Renford Dills  Pertinent  information will be shared with your doctor and your insurance company.  Social Worker:  Staci Acosta, LCSW  [phone] or [phone]  Information discussed with and copy given to patient by: Elvera Lennox, 07/12/2018, 2:10 PM

## 2018-07-12 NOTE — Progress Notes (Signed)
Social Work Social Work Assessment and Plan  Patient Details  Name: [name] [name] MRN: [id number] Date of Birth: [birth year]  Today's Date: 07/10/2018  Problem List:  Patient Active Problem List   Diagnosis Date Noted  . Abscess in epidural space of lumbar spine   . Effusion, left knee   . Pain and swelling of wrist, left   . Complete tear of left rotator cuff 07/03/2018    Class: Chronic  . AKI (acute kidney injury) ([place])   . MSSA bacteremia   . Septic arthritis of knee, left ([place]) 06/30/2018    Class: Acute  . Leukocytosis   . Postoperative seroma involving nervous system after nervous system procedure   . Shoulder pain, bilateral   . Thoracic myelopathy 06/28/2018  . Spondylosis, thoracic, with myelopathy 06/28/2018    Class: Acute  . Abscess in epidural space of L2-L5 lumbar spine 06/28/2018    Class: Acute  . Acute urinary retention 06/28/2018  . Left cervical radiculopathy 06/28/2018    Class: Acute  . Spinal stenosis of lumbar region 11/17/2017    Class: Chronic  . Status post lumbar laminectomy 11/17/2017  . Lumbar stenosis with neurogenic claudication 08/18/2017  . Forestier's disease of thoracolumbar region 08/18/2017  . Degenerative disc disease, lumbar 08/18/2017  . Lumbar radiculopathy 09/30/2016  . Morbid (severe) obesity due to excess calories ([place]) 09/30/2016  . Biliary calculus with acute cholecystitis 03/21/2016  . Acute pyelonephritis 12/28/2013  . Ureteral stone with hydronephrosis 12/28/2013  . Acute kidney failure ([place]) 12/27/2013  . Severe sepsis with acute organ dysfunction ([place]) 12/27/2013  . Hypotension 12/27/2013  . Lactic acidosis 12/27/2013  . Sepsis secondary to UTI ([place]) 12/27/2013  . EMPYEMA CHEST 08/30/2008  . PRURIGO 08/30/2008  . OBSTRUCTIVE SLEEP APNEA 08/30/2008   Past Medical History:  Past Medical History:  Diagnosis Date  . Anemia   . Arthritis   . Chronic kidney disease    HX acute kidney failure / acute pyelonephritis /  hydronephrosis / severe sepsis per discharge summary 12/27/13  . History of kidney stones   . Hypertension   . Morbid obesity ([place])   . Obstructive sleep apnea    does not need c pap since 110 lb wt loss  . Osteoporosis   . Prurigo 2002  . Scars    ON ARMS FROM CHEMICAL EXPLOSION 1999  . Spinal stenosis    Past Surgical History:  Past Surgical History:  Procedure Laterality Date  . BACK SURGERY  01/23/2010   lumbar  . CHOLECYSTECTOMY N/A 03/24/2016   Procedure: LAPAROSCOPIC CHOLECYSTECTOMY WITH INTRAOPERATIVE CHOLANGIOGRAM;  Surgeon: Autumn Messing III, MD;  Location: [facility];  Service: General;  Laterality: N/A;  . CIRCUMCISION    . CYSTOSCOPY WITH RETROGRADE PYELOGRAM, URETEROSCOPY AND STENT PLACEMENT Left 01/20/2014   Procedure: CYSTOSCOPY WITH RETROGRADE PYELOGRAM, URETEROSCOPY AND STENT EXCHANGE;  Surgeon: Molli Hazard, MD;  Location: [facility];  Service: Urology;  Laterality: Left;  bugbee bladder fulguration  . CYSTOSCOPY WITH STENT PLACEMENT Left 12/28/2013   Procedure: CYSTOSCOPY WITH STENT PLACEMENT left retrograde pyleogram;  Surgeon: Molli Hazard, MD;  Location: [facility];  Service: Urology;  Laterality: Left;  . ESOPHAGOGASTRODUODENOSCOPY N/A 12/07/2012   Procedure: ESOPHAGOGASTRODUODENOSCOPY (EGD);  Surgeon: Madilyn Hook, DO;  Location: [facility];  Service: General;  Laterality: N/A;  . HOLMIUM LASER APPLICATION Left 04/18/7340   Procedure: HOLMIUM LASER APPLICATION;  Surgeon: Molli Hazard, MD;  Location: [facility];  Service: Urology;  Laterality: Left;  . KNEE ARTHROSCOPY Left  06/29/2018   Procedure: ARTHROSCOPY KNEE;  Surgeon: Jessy Oto, MD;  Location: [place];  Service: Orthopedics;  Laterality: Left;  . LAPAROSCOPIC GASTRIC SLEEVE RESECTION N/A 12/07/2012   Procedure: LAPAROSCOPIC GASTRIC SLEEVE RESECTION;  Surgeon: Madilyn Hook, DO;  Location[facility];  Service: General;  Laterality: N/A;  laparoscopic sleeve gastrectomy with EGD  . LUMBAR LAMINECTOMY N/A 11/17/2017    Procedure: L2-3 LAMINECTOMY AND REDO LAMINECTOMIES  L3-4, L4-5 AND L5-S1;  Surgeon: Jessy Oto, MD;  Location: [facility];  Service: Orthopedics;  Laterality: N/A;  . LUMBAR WOUND DEBRIDEMENT N/A 06/29/2018   Procedure: LUMBAR WOUND DEBRIDEMENT DRAINAGE AND IRRIGATION; AND ASPIRATION OF LEFT KNEE;  Surgeon: Jessy Oto, MD;  Location: [place];  Service: Orthopedics;  Laterality: N/A;  . TEE WITHOUT CARDIOVERSION N/A 07/01/2018   Procedure: TRANSESOPHAGEAL ECHOCARDIOGRAM (TEE);  Surgeon: Larey Dresser, MD;  Location: [facility];  Service: Cardiovascular;  Laterality: N/A;   Social History:  reports that he quit smoking about 44 years ago. His smoking use included cigarettes. He has never used smokeless tobacco. He reports that he does not drink alcohol or use drugs.  Family / Support Systems Marital Status: Married Patient Roles: Spouse, Parent, Other (Comment)(grandparent; uncle) Spouse/Significant Other: Arnie Clingenpeel - wife - [phone] (h); [phone] (m) Children: sons Other Supports: nephews Anticipated Caregiver: Spouse per Admissions Coordinator - CSW to meet with her to confirm she can do expected level of care which is mostly mod A Ability/Limitations of Caregiver: Able to provide Min-Mod A, per [facility] Caregiver Availability: 24/7 Family Dynamics: Pt reports having lots of family support and that they will help him when he goes home.  Social History Preferred language: English Religion: Baptist Read: Yes Write: Yes Employment Status: Retired Date Retired/Disabled/Unemployed:  2009 - retired for [employer] Age Retired: 34 Public relations account executive Issues: none reported Guardian/Conservator: N/A - MD has determined that pt is capable of making his own decisions.   Abuse/Neglect Abuse/Neglect Assessment Can Be Completed: Yes Physical Abuse: Denies Verbal Abuse: Denies Sexual Abuse: Denies Exploitation of patient/patient's resources: Denies Self-Neglect:  Denies  Emotional Status Pt's affect, behavior and adjustment status: Pt is in good spirits and is motivated to work hard in therapy. Recent Psychosocial Issues: Pt has been feeling poorly for a while and is looking forward to recovery. Psychiatric History: none reported Substance Abuse History: none reported  Patient / Family Perceptions, Expectations & Goals Pt/Family understanding of illness & functional limitations: Pt reports a good understanding of his condition and feels the doctors have answered his questions. Premorbid pt/family roles/activities: Pt likes to travel, watch sports, and spend time with his family. Anticipated changes in roles/activities/participation: Pt would like to resume activities as he is able. Pt/family expectations/goals: Pt would like to be able to regain his independence and to throw the ball with his grandchildren.  Pt would also like to reschedule his trip to [place] he had to cancel due to medical bills and condition.  Community [employer] Agencies: None Premorbid Home Care/DME Agencies: Other (Comment)(Pt has had [facility] in the past and would like to use them again.  Pt aslo has a w/c, rolling walker, rollator, reacher, sock aide, shower seat with walk in shower, grab bars, and handicap height toilets.) Transportation available at discharge: family Resource referrals recommended: Neuropsychology  Discharge Planning Living Arrangements: Spouse/significant other Support Systems: Spouse/significant other, Children, Other relatives Type of Residence: Foster home, Private residence Administrator, sports: Multimedia programmer (specify)([employer]) Financial Resources: Other (Comment)(retirement)  Financial Screen Referred: No Money Management: Patient, Spouse Does the patient have any problems obtaining your medications?: No Home Management: Pt and wife where sharing this, but it seems recently that wife is having to manage more. Patient/Family  Preliminary Plans: Pt plans to return to his home with his wife and nephews to assist him as needed.  CSW to confirm. Social Work Anticipated Follow Up Needs: HH/OP Expected length of stay: 25 to 28 days  Clinical Impression CSW met with pt to introduce self and role of CSW, as well as to complete assessment.  Pt was talkative with CSW and he's optimistic about his recovery.  He's had some moments where he's been a little down or frustrated, but works through these and feels positive most of the time.  Pt reports good family support and that they will provide hands on care at home.  CSW to confirm this with pt's wife.  Pt had HH in the past and has most DME already at home.  Home also has a ramped entrance.  No current concerns, needs, questions.  CSW will continue to follow pt and assist as needed.  Prevatt, Silvestre Mesi 07/12/2018, 2:33 PM

## 2018-07-12 NOTE — Progress Notes (Signed)
[place] PHYSICAL MEDICINE & REHABILITATION     PROGRESS NOTE    Subjective/Complaints: Still sore from therapy but pleased with his progress. Would like ACE wrap for left knee  ROS: Patient denies fever, rash, sore throat, blurred vision, nausea, vomiting, diarrhea, cough, shortness of breath or chest pain,  headache, or mood change.   Objective:  No results found. Recent Labs    07/09/18 2003 07/10/18 0631  WBC 14.5* 11.8*  HGB 7.6* 7.2*  HCT 24.6* 22.8*  PLT 480* 453*   Recent Labs    07/09/18 2003 07/10/18 0631  NA  --  136  K  --  3.9  CL  --  103  GLUCOSE  --  104*  BUN  --  11  CREATININE 0.83 0.84  CALCIUM  --  7.8*   CBG (last 3)  No results for input(s): GLUCAP in the last 72 hours.  Wt Readings from Last 3 Encounters:  07/09/18 (!) 156.9 kg  06/28/18 (!) 147.8 kg  02/16/18 (!) 144.2 kg     Intake/Output Summary (Last 24 hours) at 07/12/2018 0805 Last data filed at 07/12/2018 0551 Gross per 24 hour  Intake 490 ml  Output 1175 ml  Net -685 ml    Vital Signs: Blood pressure 101/62, pulse (!) 105, temperature 98 F (36.7 C), resp. rate 18, height 5\' 10"  (1.778 m), weight (!) 156.9 kg, SpO2 99 %. Physical Exam:  Constitutional: No distress . Vital signs reviewed. HEENT: EOMI, oral membranes moist Neck: supple Cardiovascular: RRR without murmur. No JVD    Respiratory: CTA Bilaterally without wheezes or rales. Normal effort    GI: BS +, non-tender, non-distended  Neurological: He isalertand oriented to person, place, and time.  Skin: Back incision  with wound VAC sealed Musc: Left knee with effusion and tenderness persistent Motor strength is 5/5 in the right deltoid, bicep, tricep, grip Trace left deltoid left bicep,  4- tricep 4- grip--stable 3- to 3/5 right hip knee extensor synergy with plantar flexion 2/5 left hip knee extensor synergy with plantar flexion. Decreased LT in feet, senses pain and LT  elsewhere--stable  Assessment/Plan: 1. Functional and mobility deficits secondary to thoracic cord contusion and left C5 radiculopathy which require 3+ hours per day of interdisciplinary therapy in a comprehensive inpatient rehab setting. Physiatrist is providing close team supervision and 24 hour management of active medical problems listed below. Physiatrist and rehab team continue to assess barriers to discharge/monitor patient progress toward functional and medical goals.  Function:  Bathing Bathing position   Position: Other (comment)(bed for buttocks and sink for the rest)  Bathing parts Body parts bathed by patient: Left arm, Chest, Abdomen, Right upper leg, Left upper leg Body parts bathed by helper: Right arm, Front perineal area, Buttocks, Right lower leg, Left lower leg, Back  Bathing assist Assist Level: 2 helpers      Upper Body Dressing/Undressing Upper body dressing   What is the patient wearing?: Hospital gown                Upper body assist        Lower Body Dressing/Undressing Lower body dressing   What is the patient wearing?: Pants, Non-skid slipper socks   Underwear - Performed by helper: Thread/unthread right underwear leg, Thread/unthread left underwear leg, Pull underwear up/down   Pants- Performed by helper: Thread/unthread right pants leg, Thread/unthread left pants leg, Pull pants up/down   Non-skid slipper socks- Performed by helper: Don/doff right sock, Don/doff left sock  Lower body assist Assist for lower body dressing: 2 Helpers      Toileting Toileting Toileting activity did not occur: No continent bowel/bladder event        Toileting assist     Transfers Chair/bed transfer     Chair/bed transfer assist level: 2 helpers Chair/bed transfer assistive device: Mechanical lift Mechanical lift: Engineer, manufacturing systems   Type: Manual Max wheelchair distance: 2' Assist  Level: Supervision or verbal cues  Cognition Comprehension Comprehension assist level: Follows basic conversation/direction with no assist  Expression Expression assist level: Expresses basic needs/ideas: With no assist  Social Interaction Social Interaction assist level: Interacts appropriately with others - No medications needed.  Problem Solving    Memory      Medical Problem List and Plan: 1.Decreased functional mobilitysecondary to left C5 radiculopathy,  thoracic cord contusion with myelopathy/chronic radiculopathy-status post lumbar wound debridement drainage irrigation 9/17/2019with wound VAC placement, septic arthritis left knee MSSA bacteremia  -Continue therapies 2. DVT Prophylaxis/Anticoagulation: Subcutaneous Lovenox.  -dopplers negative 3. Pain Management:Robaxin and hydrocodone as needed  -ACE wrap left knee 4. Mood:Cymbalta 60 mg daily. Provide emotional support 5. Neuropsych: This patientiscapable of making decisions on hisown behalf. 6. Skin/Wound Care:Routine skin checks/wound VAC being followed by WOC RN 7. Fluids/Electrolytes/Nutrition: encourage PO  -Encourage p.o. 8.ID/MSSA bacteremia. IV Ancef 2 g every 8 hours through 08/11/2018 and stop. Follow-up infectious disease.   -off and on low grade temp but white blood cell count slightly decreased   9.Septic arthritis left knee. Status post arthroscopy irrigation and drainage 10.Left shoulder full-thickness retracted supraspinatus tendon tear. Conservative care 11.Acute on chronic anemia. Continue ferrous gluconate  -hgb holding around 7.2-7.6 12.BPH/urinary retention. Check PVR. Flomax 0.4 mg daily 13.Constipation. Laxative assistance   LOS (Days) 3 A FACE TO FACE EVALUATION WAS PERFORMED  Ranelle Oyster, MD 07/12/2018 8:05 AM

## 2018-07-13 ENCOUNTER — Inpatient Hospital Stay (HOSPITAL_COMMUNITY): Payer: No Typology Code available for payment source | Admitting: Physical Therapy

## 2018-07-13 ENCOUNTER — Inpatient Hospital Stay (HOSPITAL_COMMUNITY): Payer: No Typology Code available for payment source | Admitting: Occupational Therapy

## 2018-07-13 NOTE — Progress Notes (Signed)
Wound VAC dressing leaking, attempts to patch dressing not successful. [name], RN changed VAC dressing. At 0445, VAC started leaking again, unable to fix. VAC dc'd and moist to dry dressing applied. C/O left knee pain, PRN vicodin given at 0240 and ice pack applied. 2 incontinent stools during night. No void 8 hours, I & O cath at 0330=700cc's, malodorous and cloudy. LUE edema, warm to touch. Alfredo Martinez A

## 2018-07-13 NOTE — Progress Notes (Signed)
Physical Therapy Session Note  Patient Details  Name: [name] [name] MRN: [id number] Date of Birth: [birth year]  Today's Date: 07/13/2018 PT Individual Time: 1100-1200 PT Individual Time Calculation (min): 60 min   Short Term Goals: Week 1:  PT Short Term Goal 1 (Week 1): Pt will demo rolling L/R with bed features and min assist PT Short Term Goal 2 (Week 1): Pt will transfer sit<>stand with +1 assist PT Short Term Goal 3 (Week 1): Pt will transfer in/out of wheelchair with LRAD and consistent +2 assist or better PT Short Term Goal 4 (Week 1): Pt will propel w/c x30' for strengthening and cardiovascular endurance   Skilled Therapeutic Interventions/Progress Updates:    no c/o pain.    Pt sliding out of w/c on arrival, PT assisted RN, 2 NTs, and 2nd PT to reposition pt in chair with maxisky.  Educated pt throughout session on importance of maintaining forward weight shift whenever mobilizing (whether performing LE therex or functional mobility) to reduce risk of pelvis sliding forward.  Education to continue.  Session focus on functional transfers.  Pt completes slide board transfer to therapy mat on R with max/total +2 assist.  Max multimodal cues for head/hips relationship, foot placement, and forward weight shift.  Beasy board transfer back to w/c with total +3 assist due to fatigue and smaller target.  Pt requires cues as above.  +2 assist to scoot back in w/c.  PT provided pt with ELRs for BLEs for improved positioning in chair, increased sitting tolerance, and decreased risk of skin breakdown.  Returned to room at end of session and positioned upright with call bell in reach and needs met.   Therapy Documentation Precautions:  Precautions Precautions: Fall Precaution Booklet Issued: No Precaution Comments: wound VAC; Restrictions Weight Bearing Restrictions: No   See Function Navigator for Current Functional Status.   Therapy/Group: Individual Therapy  [name] 07/13/2018,  12:14 PM

## 2018-07-13 NOTE — Progress Notes (Signed)
Physical Therapy Session Note  Patient Details  Name: [name] [name] MRN: [id number] Date of Birth: 10-26-1955  Today's Date: 07/13/2018 PT Individual Time: 1300-1330 PT Individual Time Calculation (min): 30 min   Short Term Goals: Week 1:  PT Short Term Goal 1 (Week 1): Pt will demo rolling L/R with bed features and min assist PT Short Term Goal 2 (Week 1): Pt will transfer sit<>stand with +1 assist PT Short Term Goal 3 (Week 1): Pt will transfer in/out of wheelchair with LRAD and consistent +2 assist or better PT Short Term Goal 4 (Week 1): Pt will propel w/c x30' for strengthening and cardiovascular endurance   Skilled Therapeutic Interventions/Progress Updates:    Pt received seated in w/c in room, agreeable to PT. Pt reports pain in low back at surgical site and in L knee, not rated and declines intervention. Seated BLE strengthening therex x 10 reps with AAROM for RLE, AROM for LLE: marches, LAQ, HS curls, heel/toe raises, hip abd/add, glute squeezes. Seated BUE strengthening therex x 10 reps with 1# dowel: bicep curls, tricep ext, wrist ext. Pt left seated in w/c in room with needs in reach and wife present at end of therapy session.  Therapy Documentation Precautions:  Precautions Precautions: Fall Precaution Booklet Issued: No Precaution Comments: wound VAC; Restrictions Weight Bearing Restrictions: No  See Function Navigator for Current Functional Status.   Therapy/Group: Individual Therapy  Peter Congo, PT, DPT  07/13/2018, 1:37 PM

## 2018-07-13 NOTE — Progress Notes (Signed)
[place] PHYSICAL MEDICINE & REHABILITATION     PROGRESS NOTE    Subjective/Complaints: VAC with leakage last night. RN placed dressing for now. Pt states that he slept fairly well otherwise. + loose stools, cloudy urine/malodorous  ROS: Patient denies fever, rash, sore throat, blurred vision, nausea, vomiting,   cough, shortness of breath or chest pain, joint or back pain, headache, or mood change.   Objective:  No results found. No results for input(s): WBC, HGB, HCT, PLT in the last 72 hours. No results for input(s): NA, K, CL, GLUCOSE, BUN, CREATININE, CALCIUM in the last 72 hours.  Invalid input(s): CO CBG (last 3)  No results for input(s): GLUCAP in the last 72 hours.  Wt Readings from Last 3 Encounters:  07/09/18 (!) 156.9 kg  06/28/18 (!) 147.8 kg  02/16/18 (!) 144.2 kg     Intake/Output Summary (Last 24 hours) at 07/13/2018 0845 Last data filed at 07/13/2018 0649 Gross per 24 hour  Intake 860 ml  Output 1725 ml  Net -865 ml    Vital Signs: Blood pressure 97/71, pulse (!) 104, temperature 99.2 F (37.3 C), temperature source Oral, resp. rate 19, height 5\' 10"  (1.778 m), weight (!) 156.9 kg, SpO2 100 %. Physical Exam:  Constitutional: No distress . Vital signs reviewed. obese HEENT: EOMI, oral membranes moist Neck: supple Cardiovascular: RRR without murmur. No JVD    Respiratory: CTA Bilaterally without wheezes or rales. Normal effort    GI: BS +, non-tender, non-distended  Neurological: He isalertand oriented to person, place, and time.  Skin: Back incision  with dry dressing Musc: Left knee with effusion and tenderness persistent, tender Motor strength is 5/5 in the right deltoid, bicep, tricep, grip Trace left deltoid left bicep,  4- tricep 4- grip--stable 3- to 3/5 right HE, KE 2/5 LLE Decreased LT in feet, senses pain and LT elsewhere  Psych: pleasant  Assessment/Plan: 1. Functional and mobility deficits secondary to thoracic cord contusion  and left C5 radiculopathy which require 3+ hours per day of interdisciplinary therapy in a comprehensive inpatient rehab setting. Physiatrist is providing close team supervision and 24 hour management of active medical problems listed below. Physiatrist and rehab team continue to assess barriers to discharge/monitor patient progress toward functional and medical goals.  Function:  Bathing Bathing position   Position: Other (comment)(bed for buttocks and sink for the rest)  Bathing parts Body parts bathed by patient: Left arm, Chest, Abdomen, Right upper leg, Left upper leg Body parts bathed by helper: Right arm, Front perineal area, Buttocks, Right lower leg, Left lower leg, Back  Bathing assist Assist Level: 2 helpers      Upper Body Dressing/Undressing Upper body dressing   What is the patient wearing?: Hospital gown                Upper body assist        Lower Body Dressing/Undressing Lower body dressing   What is the patient wearing?: Pants, Non-skid slipper socks   Underwear - Performed by helper: Thread/unthread right underwear leg, Thread/unthread left underwear leg, Pull underwear up/down   Pants- Performed by helper: Thread/unthread right pants leg, Thread/unthread left pants leg, Pull pants up/down   Non-skid slipper socks- Performed by helper: Don/doff right sock, Don/doff left sock                  Lower body assist Assist for lower body dressing: 2 Helpers      Toileting Toileting Toileting activity did not occur:  No continent bowel/bladder event        Toileting assist     Transfers Chair/bed transfer     Chair/bed transfer assist level: 2 helpers Chair/bed transfer assistive device: Mechanical lift Mechanical lift: Engineer, manufacturing systems   Type: Manual Max wheelchair distance: 62' Assist Level: Supervision or verbal cues  Cognition Comprehension Comprehension assist level: Follows basic  conversation/direction with no assist  Expression Expression assist level: Expresses basic needs/ideas: With no assist  Social Interaction Social Interaction assist level: Interacts appropriately with others - No medications needed.  Problem Solving    Memory      Medical Problem List and Plan: 1.Decreased functional mobilitysecondary to left C5 radiculopathy,  thoracic cord contusion with myelopathy/chronic radiculopathy-status post lumbar wound debridement drainage irrigation 9/17/2019with wound VAC placement, septic arthritis left knee MSSA bacteremia  -Continue therapies 2. DVT Prophylaxis/Anticoagulation: Subcutaneous Lovenox.  -dopplers negative 3. Pain Management:Robaxin and hydrocodone as needed  -ACE wrap left knee for comfort 4. Mood:Cymbalta 60 mg daily. Provide emotional support 5. Neuropsych: This patientiscapable of making decisions on hisown behalf. 6. Skin/Wound Care:dressing in place for back wound at present  -request WOC RN follow up 7. Fluids/Electrolytes/Nutrition: encourage PO  -Encourage p.o. 8.ID/MSSA bacteremia. IV Ancef 2 g every 8 hours through 08/11/2018 and stop. Follow-up infectious disease.   -recurrent temp last night  -nursing reported odor with recent bladder emptying  -observe today, check cbc tomorrow 9.Septic arthritis left knee. Status post arthroscopy irrigation and drainage 10.Left shoulder full-thickness retracted supraspinatus tendon tear. Conservative care 11.Acute on chronic anemia. Continue ferrous gluconate  -hgb holding around 7.2-7.6  -recheck tomorrow 12.BPH/urinary retention. Check PVR. Flomax 0.4 mg daily 13.Constipation. stool "mushy" per staff---hold senokot-s   LOS (Days) 4 A FACE TO FACE EVALUATION WAS PERFORMED  Ranelle Oyster, MD 07/13/2018 8:45 AM

## 2018-07-13 NOTE — Progress Notes (Addendum)
Physical Therapy Session Note  Patient Details  Name: [name] [name] MRN: [id number] Date of Birth: 16-Mar-1956  Today's Date: 07/13/2018 PT Individual Time: 4098-1191 PT Individual Time Calculation (min): 38 min   Short Term Goals: Week 1:  PT Short Term Goal 1 (Week 1): Pt will demo rolling L/R with bed features and min assist PT Short Term Goal 2 (Week 1): Pt will transfer sit<>stand with +1 assist PT Short Term Goal 3 (Week 1): Pt will transfer in/out of wheelchair with LRAD and consistent +2 assist or better PT Short Term Goal 4 (Week 1): Pt will propel w/c x30' for strengthening and cardiovascular endurance   Skilled Therapeutic Interventions/Progress Updates:  Pt received in w/c & agreeable to tx. Pt reports 5/10 in back & reports he can feel pain in his L knee but does not rate; pt reports he is premedicated and motivated to participate in session. Provided pt with w/c gloves but pt with difficulty fully donning L glove 2/2 edema & required assistance to orient R glove. Placed pillow behind pt to prevent sacral sitting & more upright posture then pt propelled w/c room>bathroom by nurses station with assistance and cuing for steering as pt frequently veering L 2/2 decreased ability to propel with LUE. Pt requires frequent rest breaks then reports significant pain in L shoulder so activity ended. Pt set up at cybex kinetron from w/c level with max assist to place BLE on pedals. Pt utilized cybex kinetron up to 60 cm/sec with task focusing on BLE strengthening; pt even with active movement in RLE during task. Pt then assisted back to bed via maxisky and +2 assist. Pt required +2 assist for rolling L/R to remove sling. Pt left in bed with alarm set & all needs in reach.   Therapy Documentation Precautions:  Precautions Precautions: Fall Precaution Booklet Issued: No Precaution Comments: wound VAC; Restrictions Weight Bearing Restrictions: No   See Function Navigator for Current Functional  Status.   Therapy/Group: Individual Therapy  Sandi Mariscal 07/13/2018, 3:40 PM

## 2018-07-13 NOTE — Consult Note (Addendum)
WOC Nurse wound consult note Reason for Consult: Vac dressing change to middle back wound.  Pt had a Vac prior to admission and the machine is at home, according to the patient. Previous Vac dressing has been removed and there is a moist gauze dressing in place, progress notes indicate it was leaking last night. Chronic full thickness wound to middle back Wound bed: Beefy red Drainage (amount, consistency, odor) Mod amt yellow drainage.  Periwound: Small partial thickness abrasion located near the wound; appears to be related to shear, .2X.2X.1cm, pink and moist.  Covered with Vac drape to protect from further injury. Pt denies need for pain meds.  Applied one piece black foam and barrier ring to attempt to maintain a seal, then bridged the track pad to his right flank.  Cont suction on at 100 mm. WOC team will plan to change dressing Q M/W/F at 0800. Discussed plan of care with patient and he can resume use of home Vac after discharge. Cammie Mcgee MSN, RN, CWOCN, Center, CNS [phone]

## 2018-07-13 NOTE — Progress Notes (Signed)
Occupational Therapy Session Note  Patient Details  Name: [name] [name] MRN: [id number] Date of Birth: Aug 29, 1956  Today's Date: 07/13/2018 OT Individual Time: 0905-1000 OT Individual Time Calculation (min): 55 min   Short Term Goals: Week 1:  OT Short Term Goal 1 (Week 1): Pt will transfer to w/c MAX A of 2 to w/c in prep for [facility] transfer OT Short Term Goal 2 (Week 1): Pt will don shirt MOD A OT Short Term Goal 3 (Week 1): Pt will recall hemi dressing techniques for UB dressing with min question cues OT Short Term Goal 4 (Week 1): Pt will groom at sink wiht supervision and AE PRN  Skilled Therapeutic Interventions/Progress Updates:    Pt greeted in bed. Reported RN staff had just bathed him. Wanted to get dressed and transfer to w/c. Started session with perihygiene completion bedlevel after incontinent BM. 2 helpers required for safe rolling Rt>Lt at this time, and while donning pants after. Maxi Sky transfer completed to w/c with 2 assist. Tried to use recriprical scooting technique for moving hips back in w/c, but unable to with 2 assist. Therefore we used maxi sky to elevate him again, and moved hips back this way. UB bathing and dressing completed w/c level after with Min A for reaching underarms to wash. Supervision for donning overhead shirt. Oral care/grooming tasks completed with steady assist for forward weight shifts to reach items. He tended to lean to Lt when leaning forward, using Lt elbow for support. Tried to have him work on bringing body forward with trunk strength vs. compensating with sink or elbow. At end of session pt was positioned at bedside in w/c. Ice applied to Lt knee for pain relief. He was left with all needs within reach.   Therapy Documentation Precautions:  Precautions Precautions: Fall Precaution Booklet Issued: No Precaution Comments: wound VAC; Restrictions Weight Bearing Restrictions: No Pain: A little in Lt knee. Reported relief with cryotherapy.  Pain  Assessment Pain Scale: 0-10 Pain Score: 10-Worst pain ever Pain Type: Chronic pain Pain Location: Back Pain Descriptors / Indicators: Aching;Constant Pain Frequency: Constant Pain Onset: On-going Patients Stated Pain Goal: 2 Pain Intervention(s): Medication (See eMAR) ADL:      See Function Navigator for Current Functional Status.   Therapy/Group: Individual Therapy  Vida Nicol A [place] Agent 07/13/2018, 12:20 PM

## 2018-07-14 ENCOUNTER — Inpatient Hospital Stay (HOSPITAL_COMMUNITY): Payer: No Typology Code available for payment source | Admitting: Physical Therapy

## 2018-07-14 ENCOUNTER — Inpatient Hospital Stay (HOSPITAL_COMMUNITY): Payer: No Typology Code available for payment source | Admitting: Occupational Therapy

## 2018-07-14 LAB — CBC WITH DIFFERENTIAL/PLATELET
ABS IMMATURE GRANULOCYTES: 0 10*3/uL (ref 0.0–0.1)
Basophils Absolute: 0 10*3/uL (ref 0.0–0.1)
Basophils Relative: 1 %
Eosinophils Absolute: 0.1 10*3/uL (ref 0.0–0.7)
Eosinophils Relative: 2 %
HCT: 20.5 % — ABNORMAL LOW (ref 39.0–52.0)
HEMOGLOBIN: 6.3 g/dL — AB (ref 13.0–17.0)
IMMATURE GRANULOCYTES: 1 %
LYMPHS PCT: 11 %
Lymphs Abs: 0.8 10*3/uL (ref 0.7–4.0)
MCH: 27.6 pg (ref 26.0–34.0)
MCHC: 30.7 g/dL (ref 30.0–36.0)
MCV: 89.9 fL (ref 78.0–100.0)
MONO ABS: 0.9 10*3/uL (ref 0.1–1.0)
MONOS PCT: 11 %
NEUTROS ABS: 5.8 10*3/uL (ref 1.7–7.7)
NEUTROS PCT: 76 %
Platelets: 413 10*3/uL — ABNORMAL HIGH (ref 150–400)
RBC: 2.28 MIL/uL — ABNORMAL LOW (ref 4.22–5.81)
RDW: 14.9 % (ref 11.5–15.5)
WBC: 7.6 10*3/uL (ref 4.0–10.5)

## 2018-07-14 LAB — BASIC METABOLIC PANEL
Anion gap: 9 (ref 5–15)
BUN: 8 mg/dL (ref 6–20)
CO2: 26 mmol/L (ref 22–32)
CREATININE: 0.81 mg/dL (ref 0.61–1.24)
Calcium: 8.2 mg/dL — ABNORMAL LOW (ref 8.9–10.3)
Chloride: 103 mmol/L (ref 98–111)
Glucose, Bld: 101 mg/dL — ABNORMAL HIGH (ref 70–99)
POTASSIUM: 3.2 mmol/L — AB (ref 3.5–5.1)
SODIUM: 138 mmol/L (ref 135–145)

## 2018-07-14 LAB — PREPARE RBC (CROSSMATCH)

## 2018-07-14 LAB — CBC
HEMATOCRIT: 20.8 % — AB (ref 39.0–52.0)
Hemoglobin: 6.4 g/dL — CL (ref 13.0–17.0)
MCH: 27.5 pg (ref 26.0–34.0)
MCHC: 30.8 g/dL (ref 30.0–36.0)
MCV: 89.3 fL (ref 78.0–100.0)
Platelets: 418 10*3/uL — ABNORMAL HIGH (ref 150–400)
RBC: 2.33 MIL/uL — AB (ref 4.22–5.81)
RDW: 14.7 % (ref 11.5–15.5)
WBC: 7.9 10*3/uL (ref 4.0–10.5)

## 2018-07-14 MED ORDER — FUROSEMIDE 10 MG/ML IJ SOLN
20.0000 mg | Freq: Once | INTRAMUSCULAR | Status: AC
  Administered 2018-07-14: 20 mg via INTRAVENOUS

## 2018-07-14 MED ORDER — CEFAZOLIN SODIUM-DEXTROSE 2-4 GM/100ML-% IV SOLN
2.0000 g | Freq: Three times a day (TID) | INTRAVENOUS | Status: DC
  Administered 2018-07-14 – 2018-08-07 (×72): 2 g via INTRAVENOUS
  Filled 2018-07-14 (×80): qty 100

## 2018-07-14 MED ORDER — SODIUM CHLORIDE 0.9% IV SOLUTION
Freq: Once | INTRAVENOUS | Status: AC
  Administered 2018-07-14: 06:00:00 via INTRAVENOUS

## 2018-07-14 MED ORDER — FUROSEMIDE 10 MG/ML IJ SOLN
INTRAMUSCULAR | Status: AC
  Administered 2018-07-14: 20 mg
  Filled 2018-07-14: qty 2

## 2018-07-14 NOTE — Progress Notes (Signed)
Physical Therapy Note  Patient Details  Name: [name] [name] MRN: [id number] Date of Birth: [birth year] Today's Date: 07/14/2018    Pt receiving blood transfusion for Hgb 6.4  Will hold PT this session and f/u per POC.    Stephania Fragmin 07/14/2018, 3:47 PM

## 2018-07-14 NOTE — Progress Notes (Signed)
Physical Therapy Session Note  Patient Details  Name: [name] [name] MRN: [id number] Date of Birth: Feb 03, 1956  Today's Date: 07/14/2018 PT Individual Time: 1300-1315 PT Individual Time Calculation (min): 15 min   Short Term Goals: Week 1:  PT Short Term Goal 1 (Week 1): Pt will demo rolling L/R with bed features and min assist PT Short Term Goal 2 (Week 1): Pt will transfer sit<>stand with +1 assist PT Short Term Goal 3 (Week 1): Pt will transfer in/out of wheelchair with LRAD and consistent +2 assist or better PT Short Term Goal 4 (Week 1): Pt will propel w/c x30' for strengthening and cardiovascular endurance   Skilled Therapeutic Interventions/Progress Updates:   pt rec'd scooting fwd in w/c. Pt requires +3 assist to scoot back in w/c for proper and safe positioning.  pt receiving blood and pt with BP 67/47, further treatment held, RN made aware.   Therapy Documentation Precautions:  Precautions Precautions: Fall Precaution Booklet Issued: No Precaution Comments: wound VAC; Restrictions Weight Bearing Restrictions: No General: PT Amount of Missed Time (min): 15 Minutes PT Missed Treatment Reason: Patient ill (Comment)(BP 67/47 and pt recieving blood) Pain: Pt states "I just don't feel right" but no c/o pain   Therapy/Group: Individual Therapy  DONAWERTH,KAREN 07/14/2018, 1:18 PM

## 2018-07-14 NOTE — Progress Notes (Signed)
Physical Therapy Session Note  Patient Details  Name: [name] [name] MRN: [id number] Date of Birth: [birth year]  Today's Date: 07/14/2018 PT Individual Time: 1000-1100 PT Individual Time Calculation (min): 60 min   Short Term Goals: Week 1:  PT Short Term Goal 1 (Week 1): Pt will demo rolling L/R with bed features and min assist PT Short Term Goal 2 (Week 1): Pt will transfer sit<>stand with +1 assist PT Short Term Goal 3 (Week 1): Pt will transfer in/out of wheelchair with LRAD and consistent +2 assist or better PT Short Term Goal 4 (Week 1): Pt will propel w/c x30' for strengthening and cardiovascular endurance   Skilled Therapeutic Interventions/Progress Updates:    Pt seated in w/c upon PT arrival, agreeable to therapy tx and denies pain. Pt performed sit>stand within stedy from w/c with total assist +2 and using sheet around hips to pull up into extension. Pt performed sit>stand from stedy seat with total assist +2, poor placement of R knee on stedy bar despite use of pillows. Pt transferred to bed with stedy dependently. Pt transferred sit>supine with total assist +2. Pt performed rolling in both directions x3 with max assist +2 in order to adjust wound vac, remove sheet and place maxislide. Cushion of bed creating barrier to attempt maxislide transfer over to tilt table. Used maximove to transfer pt from bed<>tilt table, dependent. On the table pt brought up 45 degrees from parallel to ground, in this position worked on knee flexion/extension for quad activation, and worked on tolerating weightbearing through LEs. Pt transferred to w/c with maximove and left seated with needs in reach and chair alarm set.   Therapy Documentation Precautions:  Precautions Precautions: Fall Precaution Booklet Issued: No Precaution Comments: wound VAC; Restrictions Weight Bearing Restrictions: No   See Function Navigator for Current Functional Status.   Therapy/Group: Individual Therapy  Cresenciano Genre, PT, DPT 07/14/2018, 7:57 AM

## 2018-07-14 NOTE — Progress Notes (Signed)
[place] PHYSICAL MEDICINE & REHABILITATION     PROGRESS NOTE    Subjective/Complaints: Pt without changes. Slept well. Hgb low this morning. Pt asymptomatic. Pain levels tolerable at present  ROS: Patient denies fever, rash, sore throat, blurred vision, nausea, vomiting, diarrhea, cough, shortness of breath or chest pain,  headache, or mood change.    Objective:  No results found. Recent Labs    07/14/18 0415  WBC 7.9  HGB 6.4*  HCT 20.8*  PLT 418*   Recent Labs    07/14/18 0415  NA 138  K 3.2*  CL 103  GLUCOSE 101*  BUN 8  CREATININE 0.81  CALCIUM 8.2*   CBG (last 3)  No results for input(s): GLUCAP in the last 72 hours.  Wt Readings from Last 3 Encounters:  07/09/18 (!) 156.9 kg  06/28/18 (!) 147.8 kg  02/16/18 (!) 144.2 kg     Intake/Output Summary (Last 24 hours) at 07/14/2018 0843 Last data filed at 07/14/2018 0200 Gross per 24 hour  Intake 660 ml  Output 950 ml  Net -290 ml    Vital Signs: Blood pressure 129/74, pulse 97, temperature 99.1 F (37.3 C), temperature source Oral, resp. rate 18, height 5\' 10"  (1.778 m), weight (!) 156.9 kg, SpO2 98 %. Physical Exam:  Constitutional: No distress . Vital signs reviewed. HEENT: EOMI, oral membranes moist Neck: supple Cardiovascular: RRR without murmur. No JVD    Respiratory: CTA Bilaterally without wheezes or rales. Normal effort    GI: BS +, non-tender, non-distended  Neurological: He isalertand oriented to person, place, and time.  Skin: Back incision with vac in place Musc: Left knee with effusion and tenderness persistent, tender Motor strength is 5/5 in the right deltoid, bicep, tricep, grip Trace left deltoid left bicep,  4- tricep 4- grip--stable 3- to 3/5 right HE, KE 2- to 2+/5 LLE Decreased LT in bilateral LE  Psych: pleasant  Assessment/Plan: 1. Functional and mobility deficits secondary to thoracic cord contusion and left C5 radiculopathy which require 3+ hours per day of  interdisciplinary therapy in a comprehensive inpatient rehab setting. Physiatrist is providing close team supervision and 24 hour management of active medical problems listed below. Physiatrist and rehab team continue to assess barriers to discharge/monitor patient progress toward functional and medical goals.  Function:  Bathing Bathing position   Position: Other (comment)(bed for buttocks and sink for the rest)  Bathing parts Body parts bathed by patient: Left arm, Chest, Abdomen, Right upper leg, Left upper leg Body parts bathed by helper: Right arm, Front perineal area, Buttocks, Right lower leg, Left lower leg, Back  Bathing assist Assist Level: 2 helpers      Upper Body Dressing/Undressing Upper body dressing   What is the patient wearing?: Pull over shirt/dress     Pull over shirt/dress - Perfomed by patient: Thread/unthread right sleeve, Put head through opening, Pull shirt over trunk Pull over shirt/dress - Perfomed by helper: Thread/unthread left sleeve        Upper body assist Assist Level: Touching or steadying assistance(Pt > 75%)      Lower Body Dressing/Undressing Lower body dressing   What is the patient wearing?: Pants, Non-skid slipper socks   Underwear - Performed by helper: Thread/unthread right underwear leg, Thread/unthread left underwear leg, Pull underwear up/down   Pants- Performed by helper: Thread/unthread right pants leg, Thread/unthread left pants leg, Pull pants up/down   Non-skid slipper socks- Performed by helper: Don/doff right sock, Don/doff left sock  Lower body assist Assist for lower body dressing: 2 Helpers      Toileting Toileting Toileting activity did not occur: No continent bowel/bladder event        Toileting assist     Transfers Chair/bed transfer   Chair/bed transfer method: Lateral scoot Chair/bed transfer assist level: (3 helpers) Chair/bed transfer assistive device: Sliding board Mechanical  lift: Maximove   Locomotion Ambulation Ambulation activity did not occur: Safety/medical Investment banker, operational activity did not occur: Safety/medical concerns Type: Manual Max wheelchair distance: 76' Assist Level: Supervision or verbal cues  Cognition Comprehension Comprehension assist level: Follows basic conversation/direction with no assist  Expression Expression assist level: Expresses basic needs/ideas: With no assist  Social Interaction Social Interaction assist level: Interacts appropriately with others - No medications needed.  Problem Solving Problem solving assist level: Solves basic problems with no assist  Memory Memory assist level: More than reasonable amount of time    Medical Problem List and Plan: 1.Decreased functional mobilitysecondary to left C5 radiculopathy,  thoracic cord contusion with myelopathy/chronic radiculopathy-status post lumbar wound debridement drainage irrigation 9/17/2019with wound VAC placement, septic arthritis left knee MSSA bacteremia  -Continue therapies 2. DVT Prophylaxis/Anticoagulation: Subcutaneous Lovenox.  -dopplers negative 3. Pain Management:Robaxin and hydrocodone as needed  -ACE wrap left knee for comfort 4. Mood:Cymbalta 60 mg daily. Provide emotional support 5. Neuropsych: This patientiscapable of making decisions on hisown behalf. 6. Skin/Wound Care:continue vac therapy, sealed this morning 7. Fluids/Electrolytes/Nutrition: encourage PO  -Encourage p.o. 8.ID/MSSA bacteremia. IV Ancef 2 g every 8 hours through 08/11/2018 and stop. Follow-up infectious disease.   -persistent temps, ,wbc's down to 7.9    -observe today, check cbc tomorrow 9.Septic arthritis left knee. Status post arthroscopy irrigation and drainage 10.Left shoulder full-thickness retracted supraspinatus tendon tear. Conservative care 11.Acute on chronic anemia. Continue ferrous gluconate  -hgb holding around 7.2-7.6,  6.4 today---re-check today to confirm    12.BPH/urinary retention. Check PVR. Flomax 0.4 mg daily 13.Constipation. stool "mushy" per staff---holding senokot-s   LOS (Days) 5 A FACE TO FACE EVALUATION WAS PERFORMED  Ranelle Oyster, MD 07/14/2018 8:43 AM

## 2018-07-14 NOTE — Progress Notes (Signed)
Received a call from lab about a critical value on pt- hemoglobin 6.4. Contacted on-call NP and received an order to type and cross for 2 units of blood. Order put in and acknowledged.

## 2018-07-14 NOTE — Progress Notes (Signed)
Occupational Therapy Session Note  Patient Details  Name: Aveion Nguyen MRN: [id number] Date of Birth: 05/02/1956  Today's Date: 07/14/2018 OT Individual Time: 4098-1191 OT Individual Time Calculation (min): 72 min    Short Term Goals: Week 1:  OT Short Term Goal 1 (Week 1): Pt will transfer to w/c MAX A of 2 to w/c in prep for [facility] transfer OT Short Term Goal 2 (Week 1): Pt will don shirt MOD A OT Short Term Goal 3 (Week 1): Pt will recall hemi dressing techniques for UB dressing with min question cues OT Short Term Goal 4 (Week 1): Pt will groom at sink wiht supervision and AE PRN  Skilled Therapeutic Interventions/Progress Updates:    Upon entering the room, pt supine in bed awaiting OT arrival. Skilled OT intervention with focus on self care tasks, endurance, and activity tolerance. LB bathing tasks performed from bed level for safety. Pt rolling L <> R with mod A to complete hygiene. Maxi sky utilized to transfer pt from bed >wheelchair. Pt seated in wheelchair at sink for grooming, bathing, and dressing tasks. Pt required hand over hand assistance to utilize L UE in bathing tasks. Pt required increased time and min A for anterior weight shifts at sink while seated in wheelchair. Pt remained seated in wheelchair with RN present with call bell and all needs within reach.   Therapy Documentation Precautions:  Precautions Precautions: Fall Precaution Booklet Issued: No Precaution Comments: wound VAC; Restrictions Weight Bearing Restrictions: No General:   Vital Signs: Therapy Vitals Temp: 98.7 F (37.1 C) Temp Source: Oral Pulse Rate: 100 Resp: 18 BP: 95/85 Oxygen Therapy SpO2: 100 % O2 Device: Room Air Pain: Pain Assessment Pain Scale: 0-10 Pain Score: 10-Worst pain ever Pain Type: Chronic pain Pain Location: Back Pain Descriptors / Indicators: Aching;Constant Pain Frequency: Constant Pain Onset: On-going Patients Stated Pain Goal: 2 Pain Intervention(s): Medication  (See eMAR)   Therapy/Group: Individual Therapy  Alen Bleacher 07/14/2018, 12:27 PM

## 2018-07-15 ENCOUNTER — Encounter (HOSPITAL_COMMUNITY): Payer: No Typology Code available for payment source | Admitting: Psychology

## 2018-07-15 ENCOUNTER — Inpatient Hospital Stay (HOSPITAL_COMMUNITY): Payer: No Typology Code available for payment source | Admitting: Physical Therapy

## 2018-07-15 ENCOUNTER — Telehealth (INDEPENDENT_AMBULATORY_CARE_PROVIDER_SITE_OTHER): Payer: Self-pay | Admitting: Specialist

## 2018-07-15 ENCOUNTER — Inpatient Hospital Stay (HOSPITAL_COMMUNITY): Payer: No Typology Code available for payment source | Admitting: Occupational Therapy

## 2018-07-15 LAB — BPAM RBC
BLOOD PRODUCT EXPIRATION DATE: 201910252359
Blood Product Expiration Date: 201910252359
ISSUE DATE / TIME: 201910011154
ISSUE DATE / TIME: 201910011359
UNIT TYPE AND RH: 6200
Unit Type and Rh: 6200

## 2018-07-15 LAB — TYPE AND SCREEN
ABO/RH(D): A POS
ANTIBODY SCREEN: NEGATIVE
UNIT DIVISION: 0
Unit division: 0

## 2018-07-15 LAB — CBC
HCT: 23.9 % — ABNORMAL LOW (ref 39.0–52.0)
Hemoglobin: 7.5 g/dL — ABNORMAL LOW (ref 13.0–17.0)
MCH: 27.8 pg (ref 26.0–34.0)
MCHC: 31.4 g/dL (ref 30.0–36.0)
MCV: 88.5 fL (ref 78.0–100.0)
PLATELETS: 399 10*3/uL (ref 150–400)
RBC: 2.7 MIL/uL — AB (ref 4.22–5.81)
RDW: 15.1 % (ref 11.5–15.5)
WBC: 8.4 10*3/uL (ref 4.0–10.5)

## 2018-07-15 LAB — OCCULT BLOOD X 1 CARD TO LAB, STOOL: Fecal Occult Bld: NEGATIVE

## 2018-07-15 MED ORDER — POTASSIUM CHLORIDE CRYS ER 20 MEQ PO TBCR
20.0000 meq | EXTENDED_RELEASE_TABLET | Freq: Two times a day (BID) | ORAL | Status: DC
  Administered 2018-07-15 – 2018-08-07 (×47): 20 meq via ORAL
  Filled 2018-07-15 (×48): qty 1

## 2018-07-15 MED ORDER — LIDOCAINE HCL 1 % IJ SOLN
5.0000 mL | Freq: Once | INTRAMUSCULAR | Status: AC
  Administered 2018-07-16: 5 mL
  Filled 2018-07-15: qty 5

## 2018-07-15 MED ORDER — TRIAMCINOLONE ACETONIDE 40 MG/ML IJ SUSP
40.0000 mg | Freq: Once | INTRAMUSCULAR | Status: AC
  Administered 2018-07-16: 40 mg via INTRA_ARTICULAR
  Filled 2018-07-15: qty 1

## 2018-07-15 MED ORDER — LIDOCAINE 1% INJECTION FOR CIRCUMCISION
5.0000 mL | INJECTION | Freq: Once | INTRAVENOUS | Status: DC
  Filled 2018-07-15: qty 5

## 2018-07-15 NOTE — Progress Notes (Signed)
Physical Therapy Session Note  Patient Details  Name: [name] [name] MRN: [id number] Date of Birth: 05/28/1956  Today's Date: 07/15/2018 PT Individual Time: 1400-1455 PT Individual Time Calculation (min): 55 min   Short Term Goals: Week 1:  PT Short Term Goal 1 (Week 1): Pt will demo rolling L/R with bed features and min assist PT Short Term Goal 2 (Week 1): Pt will transfer sit<>stand with +1 assist PT Short Term Goal 3 (Week 1): Pt will transfer in/out of wheelchair with LRAD and consistent +2 assist or better PT Short Term Goal 4 (Week 1): Pt will propel w/c x30' for strengthening and cardiovascular endurance   Skilled Therapeutic Interventions/Progress Updates:    Pt received supine on mat in therapy gym from previous therapy session with OT, agreeable to PT. No complaints of pain. Supine to sit with assist x 2. Sit to stand x 2 reps with walking sling and maxisky to side of // bars, max manual cues for WBing through BLE in standing. Pt unable to achieve full upright standing and remains in half-stand with hips and knees flexed. Sliding board transfer mat table to level w/c while in maxisky sling with max A x 2 for weight shift and BLE WBing. Maxisky transfer bed to w/c. Rolling L/R with assist x 2 to remove sling. Pt left semi-reclined in bed with needs in reach, wife present.  Therapy Documentation Precautions:  Precautions Precautions: Fall Precaution Booklet Issued: No Precaution Comments: wound VAC; Restrictions Weight Bearing Restrictions: No Vital Signs: Therapy Vitals Temp: 98.1 F (36.7 C) Pulse Rate: 96 BP: 138/76 Patient Position (if appropriate): Lying Oxygen Therapy SpO2: 97 % O2 Device: Room Air  Therapy/Group: Individual Therapy  Peter Congo, PT, DPT  07/15/2018, 2:57 PM
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
## 2018-07-15 NOTE — Progress Notes (Signed)
Occupational Therapy Session Note  Patient Details  Name: [name] [name] MRN: [id number] Date of Birth: Feb 21, 1956  Today's Date: 07/15/2018 OT Individual Time: 1045-1200 OT Individual Time Calculation (min): 75 min    Short Term Goals: Week 1:  OT Short Term Goal 1 (Week 1): Pt will transfer to w/c MAX A of 2 to w/c in prep for [facility] transfer OT Short Term Goal 2 (Week 1): Pt will don shirt MOD A OT Short Term Goal 3 (Week 1): Pt will recall hemi dressing techniques for UB dressing with min question cues OT Short Term Goal 4 (Week 1): Pt will groom at sink wiht supervision and AE PRN  Skilled Therapeutic Interventions/Progress Updates:    Upon entering the room, pt supine in bed with no c/o pain this session. Pt reports recently receiving suppository and requesting to be put on bed pan. Pt rolling to L with max A for placement of bed pan but already incontinent of BM this session. Pt required total A for hygiene and LB clothing management with rolls L <> R x 6 for placement of sling and self care tasks. Pt needing +2 for hygiene and clothing management for pt assistance and therapist safety. Maxi move utilized to transfer pt from bed >wheelchair with pt reporting fatigue from bed mobility. Pt propelled wheelchair to sink for grooming tasks with increased time. Pt propelled wheelchair outside 60' towards day room with min A for management of small spaces and turning. Pt taking rest break and returning to room at end of session. Call bell and all needed items within reach. Lunch tray set up.  Therapy Documentation Precautions:  Precautions Precautions: Fall Precaution Booklet Issued: No Precaution Comments: wound VAC; Restrictions Weight Bearing Restrictions: No   Therapy/Group: Individual Therapy  Alen Bleacher 07/15/2018, 12:30 PM

## 2018-07-15 NOTE — Consult Note (Addendum)
WOC Nurse wound consult note Patient seen and NPWT changed in [facility] 4W14 Reason for Consult: VAC dressing changed Wound type: Surgical Measurement: 9.8 cm x 4 cm x 5.2 cm Wound bed: 100% pink granulation Drainage (amount, consistency, odor) Serosanginous Periwound: Intact, normal color and texture Dressing procedure/placement/frequency: VAC MWF by WOC  One piece of black foam removed, one piece of black foam placed into wound bed.  Bridged to right side. Note:  The wound VAC machine was off and when I asked the patient how long it had been off, he stated "since yesterday".  And, when I asked him why it had been turned off, he replied, "because it kept beeping".  I empowered the patient to question anyone touching the [facility] machine to find out what they are doing.  And, if they tell him they are turning it off, it is okay to tell them not to. I spoke to his primary RN, Clydie Braun, and informed her of this finding.  The results of having the VAC off for hours are that the wound and surrounding tissues were macerated, and he did not get the needed NPWT. Helmut Muster, RN, MSN, CWOCN, CNS-BC, pager [phone]

## 2018-07-15 NOTE — Telephone Encounter (Signed)
Received call from Jesusita Oka -(PA) from [facility]  to ask Dr. Otelia Sergeant how long is the wound vac needed. The number to contact Jesusita Oka is [phone]
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
## 2018-07-15 NOTE — Telephone Encounter (Signed)
Received call from Jesusita Oka -(PA) from [facility]  to ask Dr. Otelia Sergeant how long is the wound vac needed.----Please advise and I will call.

## 2018-07-15 NOTE — Progress Notes (Signed)
Occupational Therapy Session Note  Patient Details  Name: [name] [name] MRN: [id number] Date of Birth: [birth year]  Today's Date: 07/15/2018 OT Individual Time: 1310-1400 OT Individual Time Calculation (min): 50 min    Short Term Goals: Week 1:  OT Short Term Goal 1 (Week 1): Pt will transfer to w/c MAX A of 2 to w/c in prep for [facility] transfer OT Short Term Goal 2 (Week 1): Pt will don shirt MOD A OT Short Term Goal 3 (Week 1): Pt will recall hemi dressing techniques for UB dressing with min question cues OT Short Term Goal 4 (Week 1): Pt will groom at sink wiht supervision and AE PRN  Skilled Therapeutic Interventions/Progress Updates:    1:1 Pt transferred from bed to w/c via maxi sky. Transported to the gym and transferred with maxi sky to the mat table.  With parallel bar horizontally in front of him and maxi walking sling donned; focus on weight shifts forward for weight bearing through LEs. PT able to maintain standing wih weight support for ~2 min at a time with focus on glut contraction and knee extension. PT able to initiate movement but difficulty sustaining activity. Pt able to some into standing 7 times. Pt did have an incontent bowel movement in session requiring total A to clean up (on the mat). Focus on rolling side to side to assist caregiver.   Therapy Documentation Precautions:  Precautions Precautions: Fall Precaution Booklet Issued: No Precaution Comments: wound VAC; Restrictions Weight Bearing Restrictions: No General: General OT Amount of Missed Time: 10 Minutes due to needing to be cathed with education with wife from nursing Pain: Pain Assessment Pain Scale: 0-10 Pain Score: 7  Pain Type: Chronic pain Pain Location: Back Pain Orientation: Lower Pain Descriptors / Indicators: Discomfort;Dull;Aching Pain Frequency: Constant Pain Onset: On-going Pain Intervention(s): Medication (See eMAR);Repositioned / changed positions   Other Treatments:      Therapy/Group: Individual Therapy  Roney Mans [facility] 07/15/2018, 3:17 PM

## 2018-07-15 NOTE — Progress Notes (Signed)
[facility] PHYSICAL MEDICINE & REHABILITATION     PROGRESS NOTE    Subjective/Complaints: Had a reasonable night. Complains of ongoing left shoulder pain. Asked if he could have left shoulder taped  ROS: Patient denies fever, rash, sore throat, blurred vision, nausea, vomiting, diarrhea, cough, shortness of breath or chest pain, joint or back pain, headache, or mood change.   Objective:  No results found. Recent Labs    07/14/18 0832 07/15/18 0420  WBC 7.6 8.4  HGB 6.3* 7.5*  HCT 20.5* 23.9*  PLT 413* 399   Recent Labs    07/14/18 0415  NA 138  K 3.2*  CL 103  GLUCOSE 101*  BUN 8  CREATININE 0.81  CALCIUM 8.2*   CBG (last 3)  No results for input(s): GLUCAP in the last 72 hours.  Wt Readings from Last 3 Encounters:  07/09/18 (!) 156.9 kg  06/28/18 (!) 147.8 kg  02/16/18 (!) 144.2 kg     Intake/Output Summary (Last 24 hours) at 07/15/2018 0905 Last data filed at 07/15/2018 0600 Gross per 24 hour  Intake 1461 ml  Output 1950 ml  Net -489 ml    Vital Signs: Blood pressure (!) 162/81, pulse 90, temperature 98.5 F (36.9 C), temperature source Oral, resp. rate 18, height 5\' 10"  (1.778 m), weight (!) 156.9 kg, SpO2 99 %. Physical Exam:  Constitutional: No distress . Vital signs reviewed. HEENT: EOMI, oral membranes moist Neck: supple Cardiovascular: RRR without murmur. No JVD    Respiratory: CTA Bilaterally without wheezes or rales. Normal effort    GI: BS +, non-tender, non-distended  Neurological: He isalertand oriented to person, place, and time.  Skin: Back incision with vac in place Musc: Left knee with effusion and tenderness persistent, tender. Left shoulder tender with abduction and IR/ER passively Motor strength is 5/5 in the right deltoid, bicep, tricep, grip Trace left deltoid left bicep,  4- tricep 4- grip--stable 3- to 3/5 right HE, KE 2- to 2+/5 LLE---stable Decreased LT in bilateral LE  Psych: pleasant  Assessment/Plan: 1.  Functional and mobility deficits secondary to thoracic cord contusion and left C5 radiculopathy which require 3+ hours per day of interdisciplinary therapy in a comprehensive inpatient rehab setting. Physiatrist is providing close team supervision and 24 hour management of active medical problems listed below. Physiatrist and rehab team continue to assess barriers to discharge/monitor patient progress toward functional and medical goals.  Function:  Bathing Bathing position   Position: Other (comment)(bed for buttocks and sink for the rest)  Bathing parts Body parts bathed by patient: Left arm, Chest, Abdomen, Right upper leg, Left upper leg Body parts bathed by helper: Right arm, Front perineal area, Buttocks, Right lower leg, Left lower leg, Back  Bathing assist Assist Level: 2 helpers      Upper Body Dressing/Undressing Upper body dressing   What is the patient wearing?: Pull over shirt/dress     Pull over shirt/dress - Perfomed by patient: Thread/unthread right sleeve, Put head through opening, Pull shirt over trunk Pull over shirt/dress - Perfomed by helper: Thread/unthread left sleeve        Upper body assist Assist Level: Touching or steadying assistance(Pt > 75%)      Lower Body Dressing/Undressing Lower body dressing   What is the patient wearing?: Pants, Non-skid slipper socks   Underwear - Performed by helper: Thread/unthread right underwear leg, Thread/unthread left underwear leg, Pull underwear up/down   Pants- Performed by helper: Thread/unthread right pants leg, Thread/unthread left pants leg, Pull pants  up/down   Non-skid slipper socks- Performed by helper: Don/doff right sock, Don/doff left sock                  Lower body assist Assist for lower body dressing: 2 Helpers      Toileting Toileting Toileting activity did not occur: No continent bowel/bladder event        Toileting assist     Transfers Chair/bed transfer   Chair/bed transfer  method: Lateral scoot Chair/bed transfer assist level: dependent (Pt equals 0%) Chair/bed transfer assistive device: Mechanical lift Mechanical lift: Stedy   Locomotion Ambulation Ambulation activity did not occur: Safety/medical Investment banker, operational activity did not occur: Safety/medical concerns Type: Manual Max wheelchair distance: 69' Assist Level: Supervision or verbal cues  Cognition Comprehension Comprehension assist level: Follows basic conversation/direction with no assist  Expression Expression assist level: Expresses basic needs/ideas: With no assist  Social Interaction Social Interaction assist level: Interacts appropriately with others - No medications needed.  Problem Solving Problem solving assist level: Solves basic problems with no assist  Memory Memory assist level: More than reasonable amount of time    Medical Problem List and Plan: 1.Decreased functional mobilitysecondary to left C5 radiculopathy,  thoracic cord contusion with myelopathy/chronic radiculopathy-status post lumbar wound debridement drainage irrigation 9/17/2019with wound VAC placement, septic arthritis left knee MSSA bacteremia  -Continue therapies 2. DVT Prophylaxis/Anticoagulation: Subcutaneous Lovenox.  -dopplers negative 3. Pain Management:Robaxin and hydrocodone as needed  -ACE wrap left knee for comfort 4. Mood:Cymbalta 60 mg daily. Provide emotional support 5. Neuropsych: This patientiscapable of making decisions on hisown behalf. 6. Skin/Wound Care:continue vac therapy, had been turned off due to loss of suction.   -continue to work to maintain seal, d/w surgical and WOC team 7. Fluids/Electrolytes/Nutrition: encourage PO  -Encourage p.o. 8.ID/MSSA bacteremia. IV Ancef 2 g every 8 hours through 08/11/2018 and stop. Follow-up infectious disease.   -persistent temps, ,wbc's down to 7.9  9.Septic arthritis left knee. Status post arthroscopy  irrigation and drainage 10.Left shoulder full-thickness retracted supraspinatus tendon tear. Conservative care  -will inject left shoulder tomorrow to see if we can help with activity tolerance 11.Acute on chronic anemia. Continue ferrous gluconate  -hgb holding around 7.2-7.6, 6.3 yesterday----7.5 today  -check stool for OB   -continue serial CBC's 12.BPH/urinary retention.  Flomax 0.4 mg daily, I/O cath prn 13.Constipation. stool "mushy" per staff---holding senokot-s   LOS (Days) 6 A FACE TO FACE EVALUATION WAS PERFORMED  Ranelle Oyster, MD 07/15/2018 9:05 AM

## 2018-07-16 ENCOUNTER — Inpatient Hospital Stay (HOSPITAL_COMMUNITY): Payer: No Typology Code available for payment source | Admitting: Physical Therapy

## 2018-07-16 ENCOUNTER — Inpatient Hospital Stay (HOSPITAL_COMMUNITY): Payer: No Typology Code available for payment source | Admitting: Occupational Therapy

## 2018-07-16 LAB — CBC
HEMATOCRIT: 23.8 % — AB (ref 39.0–52.0)
Hemoglobin: 7.3 g/dL — ABNORMAL LOW (ref 13.0–17.0)
MCH: 27.3 pg (ref 26.0–34.0)
MCHC: 30.7 g/dL (ref 30.0–36.0)
MCV: 89.1 fL (ref 78.0–100.0)
Platelets: 361 10*3/uL (ref 150–400)
RBC: 2.67 MIL/uL — AB (ref 4.22–5.81)
RDW: 14.9 % (ref 11.5–15.5)
WBC: 5.8 10*3/uL (ref 4.0–10.5)

## 2018-07-16 MED ORDER — ENSURE ENLIVE PO LIQD
237.0000 mL | ORAL | Status: DC
  Administered 2018-07-19 – 2018-07-24 (×2): 237 mL via ORAL

## 2018-07-16 NOTE — Progress Notes (Signed)
Physical Therapy Session Note  Patient Details  Name: [name] [name] MRN: [id number] Date of Birth: [birth year]  Today's Date: 07/16/2018 PT Individual Time: 1605-1700 PT Individual Time Calculation (min): 55 min   Short Term Goals: Week 1:  PT Short Term Goal 1 (Week 1): Pt will demo rolling L/R with bed features and min assist PT Short Term Goal 2 (Week 1): Pt will transfer sit<>stand with +1 assist PT Short Term Goal 3 (Week 1): Pt will transfer in/out of wheelchair with LRAD and consistent +2 assist or better PT Short Term Goal 4 (Week 1): Pt will propel w/c x30' for strengthening and cardiovascular endurance   Skilled Therapeutic Interventions/Progress Updates:   Pt received sitting in WC and agreeable to PT  WC mobility instructed by PT with min assist to prevent loss of progress x 167f using BUE propulsion technique. Cues for posture to improve force of wheel movement.   PT instructed pt in seated therex with AAROM due to strength deficits.  LAQ  x10 Calf raises x12 Ankle DF x20 Hip abduction x10 Hip flexionx 12 Hip extension x 15 Moderate cues from PT for proper ROM, speed, and increased sustained contraction to improve strengthening aspects of movement.   Pt returned to room and performed maxisky transfer to bed with total assist +2 for safety. Pt left supine in bed with call bell in reach and all needs met.         Therapy Documentation Precautions:  Precautions Precautions: Fall Precaution Booklet Issued: No Precaution Comments: wound VAC; Restrictions Weight Bearing Restrictions: No Vital Signs: Therapy Vitals Temp: 97.8 F (36.6 C) Pulse Rate: 89 Resp: 16 BP: 132/80 Patient Position (if appropriate): Sitting Oxygen Therapy SpO2: 99 % O2 Device: Room Air Pain: Pain Assessment Pain Scale: 0-10 Pain Score: 8  Pain Type: Acute pain Pain Location: Back Pain Orientation: Lower Pain Descriptors / Indicators: Aching Pain Frequency: Constant Pain  Onset: On-going Pain Intervention(s): Medication (See eMAR)    Therapy/Group: Individual Therapy  ALorie Phenix10/12/2017, 4:36 PM

## 2018-07-16 NOTE — Progress Notes (Signed)
[place] PHYSICAL MEDICINE & REHABILITATION     PROGRESS NOTE    Subjective/Complaints: No new issues. Let shoulder still sore  ROS: Patient denies fever, rash, sore throat, blurred vision, nausea, vomiting, diarrhea, cough, shortness of breath or chest pain, joint or back pain, headache, or mood change.   Objective:  No results found. Recent Labs    07/15/18 0420 07/16/18 0442  WBC 8.4 5.8  HGB 7.5* 7.3*  HCT 23.9* 23.8*  PLT 399 361   Recent Labs    07/14/18 0415  NA 138  K 3.2*  CL 103  GLUCOSE 101*  BUN 8  CREATININE 0.81  CALCIUM 8.2*   CBG (last 3)  No results for input(s): GLUCAP in the last 72 hours.  Wt Readings from Last 3 Encounters:  07/15/18 (!) 189.6 kg  06/28/18 (!) 147.8 kg  02/16/18 (!) 144.2 kg     Intake/Output Summary (Last 24 hours) at 07/16/2018 0947 Last data filed at 07/16/2018 0837 Gross per 24 hour  Intake 910 ml  Output 1375 ml  Net -465 ml    Vital Signs: Blood pressure (!) 144/86, pulse 83, temperature (!) 97.5 F (36.4 C), temperature source Oral, resp. rate 18, height 5\' 10"  (1.778 m), weight (!) 189.6 kg, SpO2 96 %. Physical Exam:  Constitutional: No distress . Vital signs reviewed. HEENT: EOMI, oral membranes moist Neck: supple Cardiovascular: RRR without murmur. No JVD    Respiratory: CTA Bilaterally without wheezes or rales. Normal effort    GI: BS +, non-tender, non-distended  Neurological: He isalertand oriented to person, place, and time.  Skin: Back incision with vac in place Musc: Left knee with effusion and tenderness persistent, tender. Left shoulder tender with abduction and IR/ER passively Motor strength is 5/5 in the right deltoid, bicep, tricep, grip Trace left deltoid left bicep,  4- tricep 4- grip--stable 3- to 3/5 right HE, KE 2- to 2+/5 LLE---no change Decreased LT in bilateral LE  Psych: pleasant  Assessment/Plan: 1. Functional and mobility deficits secondary to thoracic cord contusion  and left C5 radiculopathy which require 3+ hours per day of interdisciplinary therapy in a comprehensive inpatient rehab setting. Physiatrist is providing close team supervision and 24 hour management of active medical problems listed below. Physiatrist and rehab team continue to assess barriers to discharge/monitor patient progress toward functional and medical goals.  Function:  Bathing Bathing position   Position: Other (comment)(bed for buttocks and sink for the rest)  Bathing parts Body parts bathed by patient: Left arm, Chest, Abdomen, Right upper leg, Left upper leg Body parts bathed by helper: Right arm, Front perineal area, Buttocks, Right lower leg, Left lower leg, Back  Bathing assist Assist Level: 2 helpers      Upper Body Dressing/Undressing Upper body dressing   What is the patient wearing?: Pull over shirt/dress     Pull over shirt/dress - Perfomed by patient: Thread/unthread right sleeve, Put head through opening, Pull shirt over trunk Pull over shirt/dress - Perfomed by helper: Thread/unthread left sleeve        Upper body assist Assist Level: Touching or steadying assistance(Pt > 75%)      Lower Body Dressing/Undressing Lower body dressing   What is the patient wearing?: Pants, Non-skid slipper socks   Underwear - Performed by helper: Thread/unthread right underwear leg, Thread/unthread left underwear leg, Pull underwear up/down   Pants- Performed by helper: Thread/unthread right pants leg, Thread/unthread left pants leg, Pull pants up/down   Non-skid slipper socks- Performed by helper:  Don/doff right sock, Don/doff left sock                  Lower body assist Assist for lower body dressing: 2 Helpers      Toileting Toileting Toileting activity did not occur: No continent bowel/bladder event        Toileting assist     Transfers Chair/bed transfer   Chair/bed transfer method: Lateral scoot Chair/bed transfer assist level: dependent (Pt equals  0%) Chair/bed transfer assistive device: Mechanical lift Mechanical lift: Stedy   Locomotion Ambulation Ambulation activity did not occur: Safety/medical Investment banker, operational activity did not occur: Safety/medical concerns Type: Manual Max wheelchair distance: 38' Assist Level: Supervision or verbal cues  Cognition Comprehension Comprehension assist level: Follows basic conversation/direction with no assist  Expression Expression assist level: Expresses basic needs/ideas: With no assist  Social Interaction Social Interaction assist level: Interacts appropriately with others - No medications needed.  Problem Solving Problem solving assist level: Solves basic problems with no assist  Memory Memory assist level: More than reasonable amount of time    Medical Problem List and Plan: 1.Decreased functional mobilitysecondary to left C5 radiculopathy,  thoracic cord contusion with myelopathy/chronic radiculopathy-status post lumbar wound debridement drainage irrigation 9/17/2019with wound VAC placement, septic arthritis left knee MSSA bacteremia  -Continue therapies 2. DVT Prophylaxis/Anticoagulation: Subcutaneous Lovenox.  -dopplers negative 3. Pain Management:Robaxin and hydrocodone as needed  -ACE wrap left knee for comfort 4. Mood:Cymbalta 60 mg daily. Provide emotional support 5. Neuropsych: This patientiscapable of making decisions on hisown behalf. 6. Skin/Wound Care:continue vac therapy, had been turned off due to loss of suction.   -continue to work to maintain seal, d/w surgical and WOC team 7. Fluids/Electrolytes/Nutrition: encourage PO  -Encourage p.o. 8.ID/MSSA bacteremia. IV Ancef 2 g every 8 hours through 08/11/2018 and stop. Follow-up infectious disease.   -persistent temps, ,wbc's down to 7.9  9.Septic arthritis left knee. Status post arthroscopy irrigation and drainage 10.Left shoulder full-thickness retracted supraspinatus  tendon tear. Conservative care  -After informed consent and preparation of the skin with betadine and isopropyl alcohol, I injected 6mg  (1cc) of celestone and 4cc of 1% lidocaine into the left subacromial space via lateral approach. Additionally, aspiration was performed prior to injection. The patient tolerated well, and no complications were encountered. Afterward the area was cleaned and dressed. Post- injection instructions were provided.   11.Acute on chronic anemia. Continue ferrous gluconate  -hgb holding around 7.2-7.6, 6.3 yesterday----7.5  10/2, 7.3 10/3  -stool OB negative  -continue serial CBC's 12.BPH/urinary retention.  Flomax 0.4 mg daily, I/O cath prn 13.Constipation. stool "mushy" per staff---holding senokot-s   LOS (Days) 7 A FACE TO FACE EVALUATION WAS PERFORMED  Ranelle Oyster, MD 07/16/2018 9:47 AM

## 2018-07-16 NOTE — Progress Notes (Signed)
Occupational Therapy Session Note  Patient Details  Name: [name] [name] MRN: [id number] Date of Birth: [birth year]  Today's Date: 07/16/2018 OT Individual Time: 7829-5621 OT Individual Time Calculation (min): 71 min    Short Term Goals: Week 1:  OT Short Term Goal 1 (Week 1): Pt will transfer to w/c MAX A of 2 to w/c in prep for [facility] transfer OT Short Term Goal 2 (Week 1): Pt will don shirt MOD A OT Short Term Goal 3 (Week 1): Pt will recall hemi dressing techniques for UB dressing with min question cues OT Short Term Goal 4 (Week 1): Pt will groom at sink wiht supervision and AE PRN  Skilled Therapeutic Interventions/Progress Updates:    Upon entering the room, pt supine in bed and RN present in the room giving medications. Pt rolling L <> R with +2 assistance and use of bed rails for LB hygiene and to don brief and LB clothing. Pt transferred from bed > wheelchair with use of maxisky. Pt's wound vac becoming loosened and RN notified and was able to reinforce. +2 assistance to facilitate weight shift to further scoot pt back into wheelchair. B shoes donned with total A. Pt propelled wheelchair to sink with increased time and OT managed wound vac and IV pole for safety. Pt performed grooming tasks at sink with supervision/set up A to obtain all needed items. Pt backing chair up and call bell placed within reach. No further needs at this time.   Therapy Documentation Precautions:  Precautions Precautions: Fall Precaution Booklet Issued: No Precaution Comments: wound VAC; Restrictions Weight Bearing Restrictions: No   Pain: Pain Assessment Pain Scale: 0-10 Pain Score: 3  Faces Pain Scale: Hurts a little bit Pain Type: Chronic pain Pain Location: Back Pain Orientation: Lower Pain Descriptors / Indicators: Aching Pain Frequency: Constant Pain Onset: On-going Patients Stated Pain Goal: 2   Therapy/Group: Individual Therapy  Alen Bleacher 07/16/2018, 11:10 AM

## 2018-07-16 NOTE — Progress Notes (Signed)
Called all pagers for wound care. Left contact number for return call.

## 2018-07-16 NOTE — Progress Notes (Signed)
Social Work Patient ID: Azim Gillingham, male   DOB: Jan 11, 1956, 62 y.o.   MRN: [id number]   CSW met with pt and his wife 07-15-18 to update them on team conference discussion and estimated length of stay of 4 weeks.  Pt/wife were accepting of this and pt remains positive and motivated.  He is experiencing a lot of pain and fatigue right now which are limiting, but he works through both.  Pt's wife plans to be his main caregiver with some help from friends.  Wife needs for pt to be as close to min A as possible.  CSW explained that some tasks may require more assistance and she seems to be comfortable with that, but would want to see where he is closer to d/c.  Family education will be offered closer to d/c.  No questions/concerns/needs at this time.  Pt's wife is working on Fish farm manager disability application for pt now and will ask CSW if she needs any assistance.  CSW remains available to assist as needed.

## 2018-07-16 NOTE — Progress Notes (Signed)
Nutrition Follow-up  DOCUMENTATION CODES:   Morbid obesity  INTERVENTION:   - Decrease Ensure Enlive to po daily, each supplement provides 350 kcal and 20 grams of protein (chocolate or vanilla flavor)  - Continue MVI with minerals daily  NUTRITION DIAGNOSIS:   Increased nutrient needs related to wound healing, post-op healing as evidenced by estimated needs.  Ongoing  GOAL:   Patient will meet greater than or equal to 90% of their needs  Progressing  MONITOR:   PO intake, Supplement acceptance, Labs, Weight trends, Skin, I & O's  REASON FOR ASSESSMENT:   Malnutrition Screening Tool    ASSESSMENT:   [name] [name] is a 62 year old right-handed male admitted 06/28/2018 with progressive lower extremity weakness as well as falls  9/15 - s/p paraspinal fluid collection 9/16 - s/p lumbar wound debridement drainage and irrigation, aspiration of lt knee, and arthroscopy of lt knee utilizing portals for irrigation and drainage of lt knee pyarthrosis  9/18 - s/p TEE  Spoke with pt and wife at bedside. Pt eating lunch at time of visit and had completed >75% of meal tray.  Pt states that he does not drink the Ensure Enlive as much since his appetite is much improved and he is eating 100% at most meals. RD to decrease order to daily as pt states he may drink one if he misses a meal.  RD continued to encourage adequate PO intake due to increased nutrient needs from wound healing and therapies.  Pt states that he is tired form therapies and sore but that he is overall doing well.  Meal Completion: 100% x past 8 meals  Medications reviewed and include: Dulcolax q 3 days, Oscal with D daily, Ensure Enlive BID, ferrous gluconate TID with meals, MVI with minerals daily, Protonix 40 mg BID, K-dur 20 mEq BID, vitamin B-12 1000 mcg daily, IV antibiotics  Labs reviewed: hemoglobin 7.3 (L), HCT 23.8 (L)  UOP: 1050 ml x 24 hours Wound VAC: 325 ml x 24 hours I/O's: -3.3 L since  admit  Diet Order:   Diet Order            Diet regular Room service appropriate? Yes; Fluid consistency: Thin  Diet effective now              EDUCATION NEEDS:   Education needs have been addressed  Skin:  Skin Assessment: Skin Integrity Issues: Skin Integrity Issues:: Incisions, Wound VAC Wound Vac: back Incisions: lt knee  Last BM:  10/2 - medium type 6  Height:   Ht Readings from Last 1 Encounters:  07/09/18 5\' 10"  (1.778 m)    Weight:   Wt Readings from Last 1 Encounters:  07/15/18 (!) 189.6 kg    Ideal Body Weight:  75.4 kg  BMI:  Body mass index is 59.98 kg/m.  Estimated Nutritional Needs:   Kcal:  2200-2400  Protein:  125-150 grams  Fluid:  > 2.2 L    Earma Reading, MS, RD, LDN Inpatient Clinical Dietitian Pager: [phone] Weekend/After Hours: [phone]

## 2018-07-16 NOTE — Progress Notes (Signed)
Occupational Therapy Session Note  Patient Details  Name: Urie Loughner MRN: [id number] Date of Birth: 1956/04/04  Today's Date: 07/16/2018 OT Individual Time: 1191-4782 OT Individual Time Calculation (min): 30 min    Short Term Goals: Week 1:  OT Short Term Goal 1 (Week 1): Pt will transfer to w/c MAX A of 2 to w/c in prep for [facility] transfer OT Short Term Goal 2 (Week 1): Pt will don shirt MOD A OT Short Term Goal 3 (Week 1): Pt will recall hemi dressing techniques for UB dressing with min question cues OT Short Term Goal 4 (Week 1): Pt will groom at sink wiht supervision and AE PRN  Skilled Therapeutic Interventions/Progress Updates:    Pt worked on BUE AROM and strengthening exercises from the wheelchair during session.  Mod demonstrational cueing for scapula retraction 1 set of 10 repetitions.  He completed 2 sets of 15 reps for shoulder extension bilaterally with use of the light resistance orange band.  He needed mod support of the LUE when completing one set with arms slightly elevated, but could complete the second set with arms at his side.  Supervision for completion of all exercises with the RUE.  He also completed 2 sets of 15 biecps curls with both UEs.  First set just AROM on the right and AAROM on the left.  Second set resistance band was added to the right where he completed 15 reps.  AAROM on the left still needed to complete repetitions.  Session finished with one set of tricep extension bilaterally 15 reps with supervision and use of band as well as external rotation with each UE, min assist on the left and mod facilitation on the right.  Pt left with spouse in room and call button and phone in reach.    Therapy Documentation Precautions:  Precautions Precautions: Fall Precaution Booklet Issued: No Precaution Comments: wound VAC; Restrictions Weight Bearing Restrictions: No   Vital Signs: Therapy Vitals Temp: 97.8 F (36.6 C) Pulse Rate: 89 Resp: 16 BP:  132/80 Patient Position (if appropriate): Sitting Oxygen Therapy SpO2: 99 % O2 Device: Room Air Pain: Pain Assessment Pain Scale: 0-10 Pain Score: 8  Faces Pain Scale: Hurts a little bit Pain Type: Acute pain Pain Location: Back Pain Orientation: Lower Pain Descriptors / Indicators: Aching Pain Frequency: Constant Pain Onset: On-going Pain Intervention(s): Medication (See eMAR)  Therapy/Group: Individual Therapy  Demont Linford OTR/L 07/16/2018, 5:24 PM

## 2018-07-16 NOTE — Progress Notes (Signed)
Physical Therapy Session Note  Patient Details  Name: [name] [name] MRN: [id number] Date of Birth: [birth year]  Today's Date: 07/16/2018 PT Individual Time: 1300-1400 PT Individual Time Calculation (min): 60 min   Short Term Goals: Week 1:  PT Short Term Goal 1 (Week 1): Pt will demo rolling L/R with bed features and min assist PT Short Term Goal 2 (Week 1): Pt will transfer sit<>stand with +1 assist PT Short Term Goal 3 (Week 1): Pt will transfer in/out of wheelchair with LRAD and consistent +2 assist or better PT Short Term Goal 4 (Week 1): Pt will propel w/c x30' for strengthening and cardiovascular endurance   Skilled Therapeutic Interventions/Progress Updates:    Pt up in w/c upon arrival. Dependent transfer to gym for time conservation. Maxisky transfer w/c to mat table. Pt incontinent of bowel during session. Rolling performed with max assist on firm surface to assist with cleaning of patient. Able to proceed to standing with walking sling and maxisky X7 attempts. Support provided to bilateral LEs to maximize knee extension and weightbearing. Parallel bars placed in front of pt for UE assist to standing as well as elevation of mat table. Following session, pt transferred back to w/c via lift. Left up in w/c with spouse present and all needs in reach.   Therapy Documentation Precautions:  Precautions Precautions: Fall Precaution Booklet Issued: No Precaution Comments: wound VAC; Restrictions Weight Bearing Restrictions: No General:   Vital Signs:  Pain:  2/10 in lumbar region, monitored during session.    Therapy/Group: Individual Therapy  Delton See, PT 07/16/2018, 2:21 PM

## 2018-07-16 NOTE — Progress Notes (Signed)
No return call from [facility] today.  Dressing to wound vac has remained intact via reinforcement. Will continue to monitor.

## 2018-07-16 NOTE — Progress Notes (Signed)
Pt getting up via sky lift with therapy and vac pulled loose.  Reinforced wound vac dressing and currently functioning correctly. Notifying wound care of incident and to suggest that be reassessed. Will continue to monitor.

## 2018-07-17 ENCOUNTER — Inpatient Hospital Stay (HOSPITAL_COMMUNITY): Payer: No Typology Code available for payment source | Admitting: Physical Therapy

## 2018-07-17 ENCOUNTER — Inpatient Hospital Stay (HOSPITAL_COMMUNITY): Payer: No Typology Code available for payment source | Admitting: Occupational Therapy

## 2018-07-17 LAB — CBC
HCT: 27.1 % — ABNORMAL LOW (ref 39.0–52.0)
HEMOGLOBIN: 8.6 g/dL — AB (ref 13.0–17.0)
MCH: 27.8 pg (ref 26.0–34.0)
MCHC: 31.7 g/dL (ref 30.0–36.0)
MCV: 87.7 fL (ref 78.0–100.0)
PLATELETS: 469 10*3/uL — AB (ref 150–400)
RBC: 3.09 MIL/uL — ABNORMAL LOW (ref 4.22–5.81)
RDW: 14.6 % (ref 11.5–15.5)
WBC: 6.4 10*3/uL (ref 4.0–10.5)

## 2018-07-17 MED ORDER — SACCHAROMYCES BOULARDII 250 MG PO CAPS
250.0000 mg | ORAL_CAPSULE | Freq: Two times a day (BID) | ORAL | Status: DC
  Administered 2018-07-17 – 2018-08-07 (×43): 250 mg via ORAL
  Filled 2018-07-17 (×43): qty 1

## 2018-07-17 NOTE — Progress Notes (Signed)
[place] PHYSICAL MEDICINE & REHABILITATION     PROGRESS NOTE    Subjective/Complaints: Had a good day with therapies yesterday. Shoulder feels better after injection  ROS: Patient denies fever, rash, sore throat, blurred vision, nausea, vomiting, diarrhea, cough, shortness of breath or chest pain, joint or back pain, headache, or mood change.  Objective:  No results found. Recent Labs    07/15/18 0420 07/16/18 0442  WBC 8.4 5.8  HGB 7.5* 7.3*  HCT 23.9* 23.8*  PLT 399 361   No results for input(s): NA, K, CL, GLUCOSE, BUN, CREATININE, CALCIUM in the last 72 hours.  Invalid input(s): CO CBG (last 3)  No results for input(s): GLUCAP in the last 72 hours.  Wt Readings from Last 3 Encounters:  07/15/18 (!) 189.6 kg  06/28/18 (!) 147.8 kg  02/16/18 (!) 144.2 kg     Intake/Output Summary (Last 24 hours) at 07/17/2018 0924 Last data filed at 07/17/2018 1610 Gross per 24 hour  Intake 1240 ml  Output 1325 ml  Net -85 ml    Vital Signs: Blood pressure (!) 153/86, pulse 84, temperature 98 F (36.7 C), temperature source Oral, resp. rate 12, height 5\' 10"  (1.778 m), weight (!) 189.6 kg, SpO2 99 %. Physical Exam:  Constitutional: No distress . Vital signs reviewed. obese HEENT: EOMI, oral membranes moist Neck: supple Cardiovascular: RRR without murmur. No JVD    Respiratory: CTA Bilaterally without wheezes or rales. Normal effort    GI: BS +, non-tender, non-distended  Neurological: He isalertand oriented to person, place, and time.  Skin: Back incision with vac in placewith seal Musc: Left knee with effusion and tenderness persistent, tender. Left shoulder tender with abduction and IR/ER passively Motor strength is 5/5 in the right deltoid, bicep, tricep, grip Trace left deltoid left bicep.   4- tricep 4- grip--stable 3- to 3/5 right HE, KE 2- to 2+/5 LLE--stable motor exam Decreased LT in bilateral LE  Psych: pleasant  Assessment/Plan: 1. Functional and  mobility deficits secondary to thoracic cord contusion and left C5 radiculopathy which require 3+ hours per day of interdisciplinary therapy in a comprehensive inpatient rehab setting. Physiatrist is providing close team supervision and 24 hour management of active medical problems listed below. Physiatrist and rehab team continue to assess barriers to discharge/monitor patient progress toward functional and medical goals.  Function:  Bathing Bathing position   Position: Other (comment)(bed for buttocks and sink for the rest)  Bathing parts Body parts bathed by patient: Left arm, Chest, Abdomen, Right upper leg, Left upper leg Body parts bathed by helper: Right arm, Front perineal area, Buttocks, Right lower leg, Left lower leg, Back  Bathing assist Assist Level: 2 helpers      Upper Body Dressing/Undressing Upper body dressing   What is the patient wearing?: Pull over shirt/dress     Pull over shirt/dress - Perfomed by patient: Thread/unthread right sleeve, Put head through opening, Pull shirt over trunk Pull over shirt/dress - Perfomed by helper: Thread/unthread left sleeve        Upper body assist Assist Level: Touching or steadying assistance(Pt > 75%)      Lower Body Dressing/Undressing Lower body dressing   What is the patient wearing?: Pants, Non-skid slipper socks   Underwear - Performed by helper: Thread/unthread right underwear leg, Thread/unthread left underwear leg, Pull underwear up/down   Pants- Performed by helper: Thread/unthread right pants leg, Thread/unthread left pants leg, Pull pants up/down   Non-skid slipper socks- Performed by helper: Don/doff right sock,  Don/doff left sock                  Lower body assist Assist for lower body dressing: 2 Helpers      Toileting Toileting Toileting activity did not occur: No continent bowel/bladder event        Toileting assist     Transfers Chair/bed transfer   Chair/bed transfer method: Lateral  scoot Chair/bed transfer assist level: dependent (Pt equals 0%) Chair/bed transfer assistive device: Mechanical lift Mechanical lift: Stedy   Locomotion Ambulation Ambulation activity did not occur: Safety/medical Investment banker, operational activity did not occur: Safety/medical concerns Type: Manual Max wheelchair distance: 35' Assist Level: Supervision or verbal cues  Cognition Comprehension Comprehension assist level: Follows basic conversation/direction with no assist  Expression Expression assist level: Expresses basic needs/ideas: With no assist  Social Interaction Social Interaction assist level: Interacts appropriately with others - No medications needed.  Problem Solving Problem solving assist level: Solves basic problems with no assist  Memory Memory assist level: More than reasonable amount of time    Medical Problem List and Plan: 1.Decreased functional mobilitysecondary to left C5 radiculopathy,  thoracic cord contusion with myelopathy/chronic radiculopathy-status post lumbar wound debridement drainage irrigation 9/17/2019with wound VAC placement, septic arthritis left knee MSSA bacteremia  -Continue therapies 2. DVT Prophylaxis/Anticoagulation: Subcutaneous Lovenox.  -dopplers negative 3. Pain Management:Robaxin and hydrocodone as needed  -ACE wrap left knee for comfort 4. Mood:Cymbalta 60 mg daily. Provide emotional support 5. Neuropsych: This patientiscapable of making decisions on hisown behalf. 6. Skin/Wound Care:continue vac therapy, had been turned off due to loss of suction.   -WOC RN assisting with vac 7. Fluids/Electrolytes/Nutrition: encourage PO  -Encourage p.o. 8.ID/MSSA bacteremia. IV Ancef 2 g every 8 hours through 08/11/2018 and stop. Follow-up infectious disease.   -persistent temps, ,wbc's down to 5.8 most recently 9.Septic arthritis left knee. Status post arthroscopy irrigation and drainage 10.Left shoulder  full-thickness retracted supraspinatus tendon tear. Conservative care  -pt reports improvement in left shoulder pain s/p steroid injection 10/3   11.Acute on chronic anemia. Continue ferrous gluconate  -hgb holding around 7.2-7.6, 6.3 yesterday----7.5  10/2, 7.3 10/3  -re-check CBC today  -stool OB negative  -continue serial CBC's 12.BPH/urinary retention.  Flomax 0.4 mg daily, I/O cath prn 13.Constipation. stool "mushy" per staff--held senokot-s  -likely antibiotic effect  -begin probiotic   LOS (Days) 8 A FACE TO FACE EVALUATION WAS PERFORMED  Ranelle Oyster, MD 07/17/2018 9:24 AM

## 2018-07-17 NOTE — Consult Note (Signed)
WOC Nurse wound follow up Wound type:surgical wound vac Measurement:see wed measurement Wound bed:100% red granulation Drainage (amount, consistency, odor) moderate serosanguinous, no odor Periwound: intact Dressing procedure/placement/frequency: VAC MWF by WOC team. One piece of black foam in wound bridge to flank. Continuous suction at 100 mmHg with immediate seal achieved. Bedside RN states it beeped yesterday and she was able to find the leak and seal it. States that the lift they use to get him into chair catches the suction piece and that is why it beeps. A dressing package was opened in room with the suction piece missing so that was changed at some point. More dressing kits, barrier rings and canister in room for Monday. Barnett Hatter, RN-C, WTA-C, OCA Wound Treatment Associate Ostomy Care Associate

## 2018-07-17 NOTE — Progress Notes (Signed)
Physical Therapy Session Note  Patient Details  Name: [name] [name] MRN: [id number] Date of Birth: Oct 23, 1955  Today's Date: 07/17/2018 PT Individual Time: 3032-2019 PT Individual Time Calculation (min): 53 min   Short Term Goals: Week 1:  PT Short Term Goal 1 (Week 1): Pt will demo rolling L/R with bed features and min assist PT Short Term Goal 2 (Week 1): Pt will transfer sit<>stand with +1 assist PT Short Term Goal 3 (Week 1): Pt will transfer in/out of wheelchair with LRAD and consistent +2 assist or better PT Short Term Goal 4 (Week 1): Pt will propel w/c x30' for strengthening and cardiovascular endurance   Skilled Therapeutic Interventions/Progress Updates:     Patient received up in [facility], pleasant and willing to participate but limited by fatigue this afternoon due to therapy earlier in day. Transported him to PT gym via [place] in [facility] and applied walking sling with totalA. Able to stand x3 with use of overhead lift and walking sling, also with parallel bars in front of patient so that he could use UEs to assist with pushing up to full standing position. Able to maintain standing for no longer than 1 minute today due to fatigue, also unable to come to full standing position even with pushing up from parallel bars with UEs so patient often remained in crouched position with PT and tech giving Mod facilitation to B quads to assist in knee extension. TotalA for maxisky transfer back to [facility], and returned to room with totalA in [facility] due to fatigue. He was left up in the chair with all needs met, wife present this afternoon.   Therapy Documentation Precautions:  Precautions Precautions: Fall Precaution Booklet Issued: No Precaution Comments: wound VAC; Restrictions Weight Bearing Restrictions: No General: PT Amount of Missed Time (min): 7 Minutes PT Missed Treatment Reason: Patient fatigue Vital Signs:  Pain: Pain Assessment Pain Scale: 0-10 Pain Score: 0-No pain Faces Pain Scale: No  hurt    Therapy/Group: Individual Therapy  Deniece Ree PT, DPT, CBIS  Supplemental Physical Therapist [facility]    Pager [phone] Acute Rehab Office [phone]   07/17/2018, 4:24 PM

## 2018-07-17 NOTE — Progress Notes (Signed)
Physical Therapy Session Note  Patient Details  Name: [name] [name] MRN: [id number] Date of Birth: 11-Dec-1955  Today's Date: 07/17/2018 PT Individual Time: 0900-0929 PT Individual Time Calculation (min): 29 min   Short Term Goals: Week 1:  PT Short Term Goal 1 (Week 1): Pt will demo rolling L/R with bed features and min assist PT Short Term Goal 2 (Week 1): Pt will transfer sit<>stand with +1 assist PT Short Term Goal 3 (Week 1): Pt will transfer in/out of wheelchair with LRAD and consistent +2 assist or better PT Short Term Goal 4 (Week 1): Pt will propel w/c x30' for strengthening and cardiovascular endurance   Skilled Therapeutic Interventions/Progress Updates:    Patient received in bed, very pleasant and willing to participate with PT today. TotalA to thread LEs through pants, then able to roll with MaxA both ways to allow therapy staff to pull up pants all the way, and for placement of MaxiSky pad. Used MaxiSky lift for dependent transfer from bed to [facility] and required MaxAx2 for positioning in chair this morning. Noted scabbed area on top of R foot, non-painful to touch and no puss or signs of infection but does appear to be slow/delayed healing taking place- will follow. He was left up in the chair with all needs met this morning.   Therapy Documentation Precautions:  Precautions Precautions: Fall Precaution Booklet Issued: No Precaution Comments: wound VAC; Restrictions Weight Bearing Restrictions: No General:   Vital Signs:  Pain: Pain Assessment Pain Scale: 0-10 Pain Score: 0-No pain    Therapy/Group: Individual Therapy  Deniece Ree PT, DPT, CBIS  Supplemental Physical Therapist [facility]    Pager [phone] Acute Rehab Office [phone]   07/17/2018, 3:43 PM

## 2018-07-17 NOTE — Progress Notes (Addendum)
Occupational Therapy Weekly Progress Note  Patient Details  Name: [name] [name] MRN: [id number] Date of Birth: 04/21/1956  Beginning of progress report period: 07/10/18 End of progress report period: 07/17/18  Today's Date: 07/17/2018 OT Individual Time: 9937-1696 OT Individual Time Calculation (min): 57 min   Patient has met 3 of 4 short term goals.   Pt is making steady progress towards LTG achievement. He is very motivated during tx sessions and exhibits high levels of participation. Pt continues to require 2 assist for LB self care bedlevel, however he has increased functional independence with UB dressing and grooming task completion since time of evaluation. Along with global strengthening, pt is presently working on supported standing using equipment to improve functional transfer abilities. Continue with POC.   Patient continues to demonstrate the following deficits: muscle weakness and muscle paralysis, decreased cardiorespiratoy endurance, abnormal tone and unbalanced muscle activation and decreased sitting balance, decreased standing balance and decreased postural control and therefore will continue to benefit from skilled OT intervention to enhance overall performance with BADL.  Patient progressing toward long term goals..  Continue plan of care.  OT Short Term Goals Week 1:  OT Short Term Goal 1 (Week 1): Pt will transfer to w/c MAX A of 2 to w/c in prep for BSC transfer OT Short Term Goal 1 - Progress (Week 1): Progressing toward goal OT Short Term Goal 2 (Week 1): Pt will don shirt MOD A OT Short Term Goal 2 - Progress (Week 1): Met OT Short Term Goal 3 (Week 1): Pt will recall hemi dressing techniques for UB dressing with min question cues OT Short Term Goal 3 - Progress (Week 1): Met OT Short Term Goal 4 (Week 1): Pt will groom at sink wiht supervision and AE PRN OT Short Term Goal 4 - Progress (Week 1): Met Week 2:  OT Short Term Goal 1 (Week 2): Pt will transfer to w/c  with Max A of 2 in prep for [facility] transfer  OT Short Term Goal 2 (Week 2): Pt will complete 1/3 components of donning pants with AE as needed OT Short Term Goal 3 (Week 2): Pt will don footwear with Mod A using AE  Skilled Therapeutic Interventions/Progress Updates:    Pt greeted in w/c and premedicated for pain. ADL needs met and ready to go. Worked on UE/LE strengthening and LE weightbearing with use of tilt table. Used [employer] for transfer to table. Positioned pt in 25 degrees and he completed 30 mini squats, 3 sets. Manual assist required for preventing knee abduction. Emphasized slow, mindful movements and activating core musculature for strength building in midranges. In 40 degrees, he completed mini squats 10 reps 4 sets with similar cuing and assist. Rest breaks required more frequently. While in 40-45 degrees, also had pt complete A/AROM UE/shoulder exercises including shoulder flexion, bicep curls, shoulder abduction, shoulder rolls, and shoulder shrugs (10 reps each). Pt required active assist 50% of the time with L UE at elbow. At end of session pt was transferred back to w/c via Maxi sky. He scooted back in w/c using recriprical scooting method, and was left with all needs within reach. Ice pack applied to Lt knee for pain mgt.   No c/o dizziness or increased pain while on tilt table    Therapy Documentation Precautions:  Precautions Precautions: Fall Precaution Booklet Issued: No Precaution Comments: wound VAC; Restrictions Weight Bearing Restrictions: No Pain: Pain Assessment Pain Scale: 0-10 Pain Score: 0-No pain Faces Pain Scale: No hurt  ADL:       Therapy/Group: Individual Therapy  Derrich Gaby A Cervando Durnin 07/17/2018, 4:26 PM

## 2018-07-17 NOTE — Progress Notes (Signed)
Physical Therapy Session Note  Patient Details  Name: [name] [name] MRN: [id number] Date of Birth: [birth year]  Today's Date: 07/17/2018 PT Individual Time: 4098-1191 PT Individual Time Calculation (min): 54 min   Short Term Goals: Week 1:  PT Short Term Goal 1 (Week 1): Pt will demo rolling L/R with bed features and min assist PT Short Term Goal 2 (Week 1): Pt will transfer sit<>stand with +1 assist PT Short Term Goal 3 (Week 1): Pt will transfer in/out of wheelchair with LRAD and consistent +2 assist or better PT Short Term Goal 4 (Week 1): Pt will propel w/c x30' for strengthening and cardiovascular endurance   Skilled Therapeutic Interventions/Progress Updates:    session focused on wt bearing through LEs with use of tilt table.  Pt transferred with maxi sky to/from tilt table for total A transfer.  Pt able to stand in tilt table at 60 degrees 15 minutes, 10 minutes.  In tilt table pt performs mini squats, quad sets, wt shifts, glute squeezes and ball toss.  Pt with improve LE strength and endurance.  Pt left in w/c with family present, needs at hand.  Therapy Documentation Precautions:  Precautions Precautions: Fall Precaution Booklet Issued: No Precaution Comments: wound VAC; Restrictions Weight Bearing Restrictions: No Pain: Pt c/o pain in Lt knee, ice applied before and after session   Therapy/Group: Individual Therapy  Erubiel Manasco 07/17/2018, 1:54 PM

## 2018-07-18 ENCOUNTER — Inpatient Hospital Stay (HOSPITAL_COMMUNITY): Payer: No Typology Code available for payment source | Admitting: Physical Therapy

## 2018-07-18 DIAGNOSIS — E876 Hypokalemia: Secondary | ICD-10-CM

## 2018-07-18 DIAGNOSIS — I10 Essential (primary) hypertension: Secondary | ICD-10-CM

## 2018-07-18 DIAGNOSIS — D62 Acute posthemorrhagic anemia: Secondary | ICD-10-CM

## 2018-07-18 DIAGNOSIS — D638 Anemia in other chronic diseases classified elsewhere: Secondary | ICD-10-CM

## 2018-07-18 LAB — CBC
HCT: 26.6 % — ABNORMAL LOW (ref 39.0–52.0)
Hemoglobin: 8 g/dL — ABNORMAL LOW (ref 13.0–17.0)
MCH: 26.9 pg (ref 26.0–34.0)
MCHC: 30.1 g/dL (ref 30.0–36.0)
MCV: 89.6 fL (ref 78.0–100.0)
PLATELETS: 466 10*3/uL — AB (ref 150–400)
RBC: 2.97 MIL/uL — AB (ref 4.22–5.81)
RDW: 14.7 % (ref 11.5–15.5)
WBC: 7.4 10*3/uL (ref 4.0–10.5)

## 2018-07-18 NOTE — Progress Notes (Signed)
Physical Therapy Weekly Progress Note  Patient Details  Name: [name] [name] MRN: [id number] Date of Birth: [birth year]  Beginning of progress report period: July 11, 2018 End of progress report period: July 18, 2018  Today's Date: 07/18/2018 PT Individual Time: 0900-0945 PT Individual Time Calculation (min): 45 min   Patient has met 1 of 4 short term goals.  Pt consistently requires assist x 2 for transfer bed to w/c. Pt has performed maxisky transfers with assist x 2 and occasional sliding board transfers with assist x 2 with use of maxisky sling for safety. Pt is able to stand with assist x2 with use of maxisky walking sling for safety and to assist with standing. Pt remains max assist for bed mobility with use of rails. Pt remains limited due to body habitus, muscle weakness, decreased endurance, and decreased activity tolerance. Pt is very motivated and exhibits good participation in therapy sessions.  Patient continues to demonstrate the following deficits muscle weakness, decreased cardiorespiratoy endurance and decreased sitting balance, decreased standing balance, decreased postural control and decreased balance strategies and therefore will continue to benefit from skilled PT intervention to increase functional independence with mobility.  Patient progressing toward long term goals..  Continue plan of care.  PT Short Term Goals Week 1:  PT Short Term Goal 1 (Week 1): Pt will demo rolling L/R with bed features and min assist PT Short Term Goal 1 - Progress (Week 1): Not met PT Short Term Goal 2 (Week 1): Pt will transfer sit<>stand with +1 assist PT Short Term Goal 2 - Progress (Week 1): Not met PT Short Term Goal 3 (Week 1): Pt will transfer in/out of wheelchair with LRAD and consistent +2 assist or better PT Short Term Goal 3 - Progress (Week 1): Met PT Short Term Goal 4 (Week 1): Pt will propel w/c x30' for strengthening and cardiovascular endurance  PT Short Term Goal 4 -  Progress (Week 1): Progressing toward goal Week 2:  PT Short Term Goal 1 (Week 2): Pt will complete rolling L/R with bed features and min A PT Short Term Goal 2 (Week 2): Pt will transfer sit to stand with assist x 1  PT Short Term Goal 3 (Week 2): Pt will complete least restrictive transfer bed to/from w/c with assist x 1 PT Short Term Goal 4 (Week 2): Pt will propel manual w/c x 30 ft for strengthening and cardiovascular endurance  Skilled Therapeutic Interventions/Progress Updates:  Pt received supine in bed, agreeable to PT. Pt reports some soreness from previous days sessions, not rated. Pt reports need to have a BM. Rolling L with max A and use of bedrail to place bedpan. Pt has some continent bowel in bedpan followed by incontinence of bowel on bedclothes. Rolling L/R with max A for dependent pericare and to don brief and pants and to place maxisky sling. Maxisky transfer bed to w/c. Pt left seated in w/c in room with needs in reach.  Therapy Documentation Precautions:  Precautions Precautions: Fall Precaution Booklet Issued: No Precaution Comments: wound VAC; Restrictions Weight Bearing Restrictions: No  Therapy/Group: Individual Therapy  Excell Seltzer, PT, DPT  07/18/2018, 4:03 PM

## 2018-07-18 NOTE — Progress Notes (Signed)
PHYSICAL MEDICINE & REHABILITATION     PROGRESS NOTE    Subjective/Complaints: Patient seen laying in bed this morning.  He states he slept well overnight.  He is in good spirits.  ROS: Denies CP, SOB, N/V/D  Objective:  No results found. Recent Labs    07/17/18 0948 07/18/18 0309  WBC 6.4 7.4  HGB 8.6* 8.0*  HCT 27.1* 26.6*  PLT 469* 466*   No results for input(s): NA, K, CL, GLUCOSE, BUN, CREATININE, CALCIUM in the last 72 hours.  Invalid input(s): CO CBG (last 3)  No results for input(s): GLUCAP in the last 72 hours.  Wt Readings from Last 3 Encounters:  07/15/18 (!) 189.6 kg  06/28/18 (!) 147.8 kg  02/16/18 (!) 144.2 kg     Intake/Output Summary (Last 24 hours) at 07/18/2018 0946 Last data filed at 07/18/2018 0353 Gross per 24 hour  Intake 600 ml  Output 1350 ml  Net -750 ml    Vital Signs: Blood pressure (!) 146/91, pulse 79, temperature 98.1 F (36.7 C), temperature source Oral, resp. rate 17, height 5\' 10"  (1.778 m), weight (!) 189.6 kg, SpO2 96 %. Physical Exam:  Constitutional: No distress . Vital signs reviewed.  Obese. HENT: Normocephalic.  Atraumatic. Eyes: EOMI. No discharge. Cardiovascular: RRR. No JVD. Respiratory: CTA Bilaterally. Normal effort. GI: BS +. Non-distended. Musc: Lower extremity edema. Neurological: He isalertand oriented  Motor:  5/5 in the right deltoid, bicep, tricep, grip 2/5 right HE, KE, ADF 2-/5 LLE Skin:Intact.  Warm and dry.  Lower extremity vascular changes. Psych: pleasant.  Normal mood.  Normal behavior.  Assessment/Plan: 1. Functional and mobility deficits secondary to thoracic cord contusion and left C5 radiculopathy which require 3+ hours per day of interdisciplinary therapy in a comprehensive inpatient rehab setting. Physiatrist is providing close team supervision and 24 hour management of active medical problems listed below. Physiatrist and rehab team continue to assess barriers to  discharge/monitor patient progress toward functional and medical goals.  Function:  Bathing Bathing position   Position: Other (comment)(bed for buttocks and sink for the rest)  Bathing parts Body parts bathed by patient: Left arm, Chest, Abdomen, Right upper leg, Left upper leg Body parts bathed by helper: Right arm, Front perineal area, Buttocks, Right lower leg, Left lower leg, Back  Bathing assist Assist Level: 2 helpers      Upper Body Dressing/Undressing Upper body dressing   What is the patient wearing?: Pull over shirt/dress     Pull over shirt/dress - Perfomed by patient: Thread/unthread right sleeve, Put head through opening, Pull shirt over trunk Pull over shirt/dress - Perfomed by helper: Thread/unthread left sleeve        Upper body assist Assist Level: Touching or steadying assistance(Pt > 75%)      Lower Body Dressing/Undressing Lower body dressing   What is the patient wearing?: Pants, Non-skid slipper socks   Underwear - Performed by helper: Thread/unthread right underwear leg, Thread/unthread left underwear leg, Pull underwear up/down   Pants- Performed by helper: Thread/unthread right pants leg, Thread/unthread left pants leg, Pull pants up/down   Non-skid slipper socks- Performed by helper: Don/doff right sock, Don/doff left sock                  Lower body assist Assist for lower body dressing: 2 Helpers      Toileting Toileting Toileting activity did not occur: No continent bowel/bladder event        Toileting assist  Transfers Chair/bed transfer   Chair/bed transfer method: Lateral scoot Chair/bed transfer assist level: dependent (Pt equals 0%) Chair/bed transfer assistive device: Mechanical lift Mechanical lift: Stedy   Locomotion Ambulation Ambulation activity did not occur: Safety/medical Investment banker, operational activity did not occur: Safety/medical concerns Type: Manual Max wheelchair distance:  43' Assist Level: Supervision or verbal cues  Cognition Comprehension Comprehension assist level: Follows basic conversation/direction with no assist  Expression Expression assist level: Expresses basic needs/ideas: With no assist  Social Interaction Social Interaction assist level: Interacts appropriately with others - No medications needed.  Problem Solving Problem solving assist level: Solves basic problems with no assist  Memory Memory assist level: More than reasonable amount of time    Medical Problem List and Plan: 1.Decreased functional mobilitysecondary to left C5 radiculopathy,  thoracic cord contusion with myelopathy/chronic radiculopathy-status post lumbar wound debridement drainage irrigation 9/17/2019with wound VAC placement, septic arthritis left knee MSSA bacteremia  -Continue CIR  Notes reviewed- multilevel neuroforaminal narrowing with plans for cervical follow-up, images reviewed-multilevel spinal disease, labs reviewed 2. DVT Prophylaxis/Anticoagulation: Subcutaneous Lovenox.  -dopplers negative 3. Pain Management:Robaxin and hydrocodone as needed  -ACE wrap left knee for comfort 4. Mood:Cymbalta 60 mg daily. Provide emotional support 5. Neuropsych: This patientiscapable of making decisions on hisown behalf. 6. Skin/Wound Care:   -WOC RN assisting with vac 7. Fluids/Electrolytes/Nutrition: encourage PO  -Encourage p.o. 8.ID/MSSA bacteremia. IV Ancef 2 g every 8 hours through 08/11/2018 and stoped. Follow-up infectious disease.  9.Septic arthritis left knee. Status post arthroscopy irrigation and drainage 10.Left shoulder full-thickness retracted supraspinatus tendon tear. Conservative care  -pt reports improvement in left shoulder pain s/p steroid injection 10/3   11.Acute on chronic anemia. Continue ferrous gluconate  Hemoglobin 8.0 on 10/5  Continue to monitor  -stool OB negative  -continue serial CBC's 12.BPH/urinary retention.   Flomax 0.4 mg daily, I/O cath prn 13.Constipation. stool "mushy" per staff--held senokot-s  -likely antibiotic effect  -begin probiotic 13.  HTN  Continue to moderate consider medication if necessary 14.  Hypokalemia  Potassium 3.2 on 10/1  Continue supplementation  Ordered for Monday  LOS (Days) 9 A FACE TO FACE EVALUATION WAS PERFORMED  Zelphia Glover Karis Juba, MD 07/18/2018 9:46 AM

## 2018-07-19 ENCOUNTER — Inpatient Hospital Stay (HOSPITAL_COMMUNITY): Payer: No Typology Code available for payment source | Admitting: Occupational Therapy

## 2018-07-19 DIAGNOSIS — R339 Retention of urine, unspecified: Secondary | ICD-10-CM

## 2018-07-19 DIAGNOSIS — R0989 Other specified symptoms and signs involving the circulatory and respiratory systems: Secondary | ICD-10-CM

## 2018-07-19 DIAGNOSIS — R7881 Bacteremia: Secondary | ICD-10-CM

## 2018-07-19 NOTE — Progress Notes (Addendum)
[place] PHYSICAL MEDICINE & REHABILITATION     PROGRESS NOTE    Subjective/Complaints: Patient seen laying in bed this morning.  He is about to be cathed by nursing.  He states he slept well overnight.  Denies complaints.  He is in good spirits.  ROS: Denies CP, SOB, N/V/D  Objective:  No results found. Recent Labs    07/17/18 0948 07/18/18 0309  WBC 6.4 7.4  HGB 8.6* 8.0*  HCT 27.1* 26.6*  PLT 469* 466*   No results for input(s): NA, K, CL, GLUCOSE, BUN, CREATININE, CALCIUM in the last 72 hours.  Invalid input(s): CO CBG (last 3)  No results for input(s): GLUCAP in the last 72 hours.  Wt Readings from Last 3 Encounters:  07/15/18 (!) 189.6 kg  06/28/18 (!) 147.8 kg  02/16/18 (!) 144.2 kg     Intake/Output Summary (Last 24 hours) at 07/19/2018 0742 Last data filed at 07/19/2018 0600 Gross per 24 hour  Intake 700 ml  Output 2400 ml  Net -1700 ml    Vital Signs: Blood pressure (!) 178/93, pulse 76, temperature 98.2 F (36.8 C), temperature source Oral, resp. rate 16, height 5\' 10"  (1.778 m), weight (!) 189.6 kg, SpO2 100 %. Physical Exam:  Constitutional: No distress . Vital signs reviewed.  Obese. HENT: Normocephalic.  Atraumatic. Eyes: EOMI. No discharge. Cardiovascular: RRR.  No JVD. Respiratory: CTA bilaterally.  Normal effort. GI: BS +. Non-distended. Musc: Lower extremity edema. Neurological: He isalertand oriented  Motor:  5/5 in the right deltoid, bicep, tricep, grip 2/5 right HE, KE, ADF, stable 2-/5 LLE, stable Skin:Intact.  Warm and dry.  Lower extremity vascular changes. Psych: pleasant.  Normal mood.  Normal behavior.  Assessment/Plan: 1. Functional and mobility deficits secondary to thoracic cord contusion and left C5 radiculopathy which require 3+ hours per day of interdisciplinary therapy in a comprehensive inpatient rehab setting. Physiatrist is providing close team supervision and 24 hour management of active medical problems listed  below. Physiatrist and rehab team continue to assess barriers to discharge/monitor patient progress toward functional and medical goals.  Function:  Bathing Bathing position   Position: Other (comment)(bed for buttocks and sink for the rest)  Bathing parts Body parts bathed by patient: Left arm, Chest, Abdomen, Right upper leg, Left upper leg Body parts bathed by helper: Right arm, Front perineal area, Buttocks, Right lower leg, Left lower leg, Back  Bathing assist Assist Level: 2 helpers      Upper Body Dressing/Undressing Upper body dressing   What is the patient wearing?: Pull over shirt/dress     Pull over shirt/dress - Perfomed by patient: Thread/unthread right sleeve, Put head through opening, Pull shirt over trunk Pull over shirt/dress - Perfomed by helper: Thread/unthread left sleeve        Upper body assist Assist Level: Touching or steadying assistance(Pt > 75%)      Lower Body Dressing/Undressing Lower body dressing   What is the patient wearing?: Pants, Non-skid slipper socks   Underwear - Performed by helper: Thread/unthread right underwear leg, Thread/unthread left underwear leg, Pull underwear up/down   Pants- Performed by helper: Thread/unthread right pants leg, Thread/unthread left pants leg, Pull pants up/down   Non-skid slipper socks- Performed by helper: Don/doff right sock, Don/doff left sock                  Lower body assist Assist for lower body dressing: 2 Helpers      Toileting Toileting Toileting activity did not occur: No  continent bowel/bladder event        Toileting assist     Transfers Chair/bed transfer   Chair/bed transfer method: Lateral scoot Chair/bed transfer assist level: dependent (Pt equals 0%) Chair/bed transfer assistive device: Mechanical lift Mechanical lift: Stedy   Locomotion Ambulation Ambulation activity did not occur: Safety/medical Investment banker, operational activity did not occur:  Safety/medical concerns Type: Manual Max wheelchair distance: 80' Assist Level: Supervision or verbal cues  Cognition Comprehension Comprehension assist level: Follows basic conversation/direction with no assist  Expression Expression assist level: Expresses basic needs/ideas: With no assist  Social Interaction Social Interaction assist level: Interacts appropriately with others - No medications needed.  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 DVT Prophylaxis/Anticoagulation: Subcutaneous Lovenox.  -dopplers negative 3. Pain Management:Robaxin and hydrocodone as needed  -ACE wrap left knee for comfort 4. Mood:Cymbalta 60 mg daily. Provide emotional support 5. Neuropsych: This patientiscapable of making decisions on hisown behalf. 6. Skin/Wound Care:   -WOC RN assisting with vac 7. Fluids/Electrolytes/Nutrition: encourage PO  -Encourage p.o. 8.ID/MSSA bacteremia. IV Ancef 2 g every 8 hours through 08/11/2018 and stoped. Follow-up infectious disease.  9.Septic arthritis left knee. Status post arthroscopy irrigation and drainage 10.Left shoulder full-thickness retracted supraspinatus tendon tear. Conservative care  -pt reports improvement in left shoulder pain s/p steroid injection 10/3   11.Acute on chronic anemia. Continue ferrous gluconate  Hemoglobin 8.0 on 10/5  Continue to monitor  -stool OB negative  -continue serial CBC's 12.BPH/urinary retention.  Flomax 0.4 mg daily, I/O cath prn 13.Constipation. stool "mushy" per  staff--held senokot-s  -likely antibiotic effect  -Continue probiotic 13.  HTN  Labile on 10/6, monitor for trend 14.  Hypokalemia  Potassium 3.2 on 10/1  Continue supplementation  Labs ordered for tomorrow  LOS (Days) 10 A FACE TO FACE EVALUATION WAS PERFORMED  Drevion Offord Karis Juba, MD 07/19/2018 7:42 AM

## 2018-07-19 NOTE — Progress Notes (Signed)
Occupational Therapy Session Note  Patient Details  Name: [name] [name] MRN: [id number] Date of Birth: Jan 22, 1956  Today's Date: 07/19/2018 OT Group Time: 1131-1201 OT Group Time Calculation (min): 30 min   Skilled Therapeutic Interventions/Progress Updates:    Pt participated in therapeutic w/c level dance group with focus on UE/LE strengthening, activity tolerance, and social participation for carryover during self care tasks. Pt was guided through various dance-based exercises involving UB/LB and trunk. Emphasis placed on NMR/general strengthening and endurance. 30 minutes missed due to RN care. When he arrived, pt opted to work on his arms vs legs. Pt bilaterally integrated UEs during claps, am rolls, punches, and hand-holding with others. Pt closing his eyes and singing to familiar music, often smiling. At end of session pt was returned to room by RT.    Therapy Documentation Precautions:  Precautions Precautions: Fall Precaution Booklet Issued: No Precaution Comments: wound VAC; Restrictions Weight Bearing Restrictions: No Vital Signs: Therapy Vitals Temp: 98 F (36.7 C) Temp Source: Oral Pulse Rate: (!) 105 Resp: 20 BP: 111/79 Patient Position (if appropriate): Lying Oxygen Therapy SpO2: 99 % O2 Device: Room Air Pain: No s/s pain during session  Pain Assessment Pain Scale: 0-10 Pain Score: 5  Pain Type: Acute pain Pain Location: Back Pain Orientation: Lower Pain Descriptors / Indicators: Aching Pain Frequency: Intermittent Pain Onset: Gradual Patients Stated Pain Goal: 2 Pain Intervention(s): Medication (See eMAR) ADL:       Therapy/Group: Group Therapy  Darlys Buis A Duchess Armendarez 07/19/2018, 12:53 PM

## 2018-07-20 ENCOUNTER — Inpatient Hospital Stay (HOSPITAL_COMMUNITY): Payer: No Typology Code available for payment source | Admitting: Physical Therapy

## 2018-07-20 ENCOUNTER — Inpatient Hospital Stay (HOSPITAL_COMMUNITY): Payer: No Typology Code available for payment source | Admitting: Occupational Therapy

## 2018-07-20 LAB — BASIC METABOLIC PANEL
ANION GAP: 7 (ref 5–15)
BUN: 9 mg/dL (ref 6–20)
CO2: 26 mmol/L (ref 22–32)
Calcium: 8 mg/dL — ABNORMAL LOW (ref 8.9–10.3)
Chloride: 105 mmol/L (ref 98–111)
Creatinine, Ser: 0.66 mg/dL (ref 0.61–1.24)
GFR calc Af Amer: >60 mL/min (ref >60)
GFR calc non Af Amer: >60 mL/min (ref >60)
GLUCOSE: 99 mg/dL (ref 70–99)
POTASSIUM: 3.9 mmol/L (ref 3.5–5.1)
Sodium: 138 mmol/L (ref 135–145)

## 2018-07-20 MED ORDER — AMLODIPINE BESYLATE 5 MG PO TABS
5.0000 mg | ORAL_TABLET | Freq: Every day | ORAL | Status: DC
  Administered 2018-07-20 – 2018-08-06 (×17): 5 mg via ORAL
  Filled 2018-07-20 (×19): qty 1

## 2018-07-20 NOTE — Progress Notes (Signed)
[place] PHYSICAL MEDICINE & REHABILITATION PROGRESS NOTE   Subjective/Complaints: Had a reasonable weekend. Still working through [facility]  ROS: Patient denies fever, rash, sore throat, blurred vision, nausea, vomiting, diarrhea, cough, shortness of breath or chest pain,  headache, or mood change.    Objective:   No results found. Recent Labs    07/17/18 0948 07/18/18 0309  WBC 6.4 7.4  HGB 8.6* 8.0*  HCT 27.1* 26.6*  PLT 469* 466*   Recent Labs    07/20/18 0400  NA 138  K 3.9  CL 105  CO2 26  GLUCOSE 99  BUN 9  CREATININE 0.66  CALCIUM 8.0*    Intake/Output Summary (Last 24 hours) at 07/20/2018 0921 Last data filed at 07/20/2018 0750 Gross per 24 hour  Intake 800 ml  Output 3849 ml  Net -3049 ml     Physical Exam: Vital Signs Blood pressure (!) 172/95, pulse 74, temperature 98.1 F (36.7 C), temperature source Oral, resp. rate 16, height 5\' 10"  (1.778 m), weight (!) 189.6 kg, SpO2 100 %.  Constitutional: No distress . Vital signs reviewed. HEENT: EOMI, oral membranes moist Neck: supple Cardiovascular: RRR without murmur. No JVD    Respiratory: CTA Bilaterally without wheezes or rales. Normal effort    GI: BS +, non-tender, non-distended  Musc: Lower extremity edema. Left shoulder with improved PROM/AROM Neurological: He isalertand oriented  Motor:  5/5 in the right deltoid, bicep, tricep, grip 2/5 right HE, KE, ADF, stable 2-/5 LLE, stable Skin:Intact.  Warm and dry.  vac in place. Psych: pleasant.  Normal mood.  Normal behavior.   Assessment/Plan: 1. Functional deficits secondary to thoracic SCI, septic arthritis which require 3+ hours per day of interdisciplinary therapy in a comprehensive inpatient rehab setting.  Physiatrist is providing close team supervision and 24 hour management of active medical problems listed below.  Physiatrist and rehab team continue to assess barriers to discharge/monitor patient progress toward functional and medical  goals  Care Tool:  Bathing    Body parts bathed by patient: Left arm, Chest, Abdomen, Face   Body parts bathed by helper: Front perineal area, Buttocks, Right arm, Right upper leg, Left upper leg, Right lower leg, Left lower leg     Bathing assist Assist Level: Maximal Assistance - Patient 24 - 49%     Upper Body Dressing/Undressing Upper body dressing Upper body dressing/undressing activity did not occur (including orthotics): Safety/medical concerns What is the patient wearing?: Pull over shirt    Upper body assist Assist Level: Minimal Assistance - Patient > 75%    Lower Body Dressing/Undressing Lower body dressing      What is the patient wearing?: Pants     Lower body assist Assist for lower body dressing: 2 Helpers     Toileting Toileting Toileting Activity did not occur (Clothing management and hygiene only): N/A (no void or bm)  Toileting assist Assist for toileting: 2 Helpers     Transfers Chair/bed transfer  Transfers assist     Chair/bed transfer assist level: Dependent - mechanical lift     Locomotion Ambulation   Ambulation assist   Ambulation activity did not occur: Safety/medical concerns          Walk 10 feet activity   Assist           Walk 50 feet activity   Assist           Walk 150 feet activity   Assist  Walk 10 feet on uneven surface  activity   Assist           Wheelchair     Assist   Type of Wheelchair: Manual Wheelchair activity did not occur: Safety/medical concerns  Wheelchair assist level: Minimal Assistance - Patient > 75% Max wheelchair distance: 21'    Wheelchair 50 feet with 2 turns activity    Assist            Wheelchair 150 feet activity     Assist          Medical Problem List and Plan: 1.Decreased functional mobilitysecondary to left C5 radiculopathy,  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 DVT Prophylaxis/Anticoagulation: Subcutaneous Lovenox.             -dopplers negative 3. Pain Management:Robaxin and hydrocodone as needed             -ACE wrap left knee for comfort as needed 4. Mood:Cymbalta 60 mg daily. Provide emotional support 5. Neuropsych: This patientiscapable of making decisions on hisown behalf. 6. Skin/Wound Care:              -WOC RN assisting with vac---appreciate help! 7. Fluids/Electrolytes/Nutrition: encourage PO             -I personally reviewed all of the patient's labs today, and lab work is within normal limits 10/7. 8.ID/MSSA bacteremia. IV Ancef 2 g every 8 hours through 08/11/2018 and stoped. Follow-up infectious disease.  9.Septic arthritis left knee. Status post arthroscopy irrigation and drainage 10.Left shoulder full-thickness retracted supraspinatus tendon tear. Conservative care             - improvement in left shoulder pain s/p steroid injection 10/3   11.Acute on chronic anemia. Continue ferrous gluconate             Hemoglobin 8.0 on 10/5             Continue to monitor             -stool OB negative             -continue serial CBC's--check tomorrow 12.BPH/urinary retention.  Flomax 0.4 mg daily, I/O cath prn 13.Constipation. stool "mushy" per staff--held senokot-s             -likely antibiotic effect             -Continue probiotic 13.  HTN             increased BP persistently  -initiate norvasc 5mg  daily 14.  Hypokalemia             Potassium 3.9 10/7                  LOS: 11 days A FACE TO FACE EVALUATION WAS PERFORMED  Ranelle Oyster 07/20/2018, 9:21 AM

## 2018-07-20 NOTE — Progress Notes (Signed)
Physical Therapy Session Note  Patient Details  Name: [name] [name] MRN: [id number] Date of Birth: February 01, 1956  Today's Date: 07/20/2018 PT Individual Time: 4098-1191; 4782-9562 PT Individual Time Calculation (min): 40 min and 15 min PT Missed Time: 15 min PT Missed Time Reason: pain  Short Term Goals: Week 2:  PT Short Term Goal 1 (Week 2): Pt will complete rolling L/R with bed features and min A PT Short Term Goal 2 (Week 2): Pt will transfer sit to stand with assist x 1  PT Short Term Goal 3 (Week 2): Pt will complete least restrictive transfer bed to/from w/c with assist x 1 PT Short Term Goal 4 (Week 2): Pt will propel manual w/c x 30 ft for strengthening and cardiovascular endurance  Skilled Therapeutic Interventions/Progress Updates:  Session 1:   Pt received seated in w/c in room, agreeable to PT. Pt reports 9/10 pain in low back at rest this AM, RN notified and pain details below. Pt receives Tylenol from RN for pain management. Pt has no relief of pain with repositioning. Attempt sit to stand to stedy with sheet around hips and assist x 2, pt is able to come to half-stand before increase in low back pain requires him to sit. Pt declines any further participation in standing attempts due to pain. Manual w/c propulsion 3 x 30 ft, 1 x 40 ft with BUE and Supervision for global strengthening and endurance. Pt left seated in w/c in room at end of therapy session set up for lunch, call button in reach, wife present.  Session 2: Pt received seated in w/c in room, reports continued 9/10 lower back pain with no relief from Tylenol and is not able to receive any further medication until later this evening. Pt requests to lay back down in bed. Maxisky transfer w/c to bed. Rolling L/R with max A and use of bedrails to remove maxisky sling and place pad on bed. Pt left semi-reclined in bed with needs in reach and wife present. Pt missed 15 min of scheduled therapy session due to pain.  Therapy  Documentation Precautions:  Precautions Precautions: Fall Precaution Booklet Issued: No Precaution Comments: wound VAC; Restrictions Weight Bearing Restrictions: No  Pain: Pain Assessment Pain Scale: 0-10 Pain Score: 9  Pain Type: Acute pain Pain Location: Back Pain Orientation: Lower Pain Descriptors / Indicators: Aching;Throbbing Pain Frequency: Intermittent Pain Onset: Progressive Patients Stated Pain Goal: 2 Pain Intervention(s): Medication (See eMAR)   Therapy/Group: Individual Therapy  Peter Congo, PT, DPT  07/20/2018, 12:02 PM

## 2018-07-20 NOTE — Progress Notes (Signed)
     Subjective:   3 weeks post lumbar I&D and lavage left knee for Staph infection. PICCU line in place. Day # 19 Ancef. In good spirits, OT in room this afternoon.   Patient reports pain as moderate.    Objective:   VITALS:  Temp:  [98 F (36.7 C)-98.4 F (36.9 C)] 98.1 F (36.7 C) (10/07 0420) Pulse Rate:  [74-81] 74 (10/07 0420) Resp:  [16-20] 16 (10/07 0420) BP: (156-173)/(79-95) 172/95 (10/07 0420) SpO2:  [98 %-100 %] 100 % (10/07 0420)  ABD soft Dorsiflexion/Plantar flexion intact Incision: VAC changed this AM. Wound is contracting.    LABS Recent Labs    07/18/18 0309  HGB 8.0*  WBC 7.4  PLT 466*   Recent Labs    07/20/18 0400  NA 138  K 3.9  CL 105  CO2 26  BUN 9  CREATININE 0.66  GLUCOSE 99   No results for input(s): LABPT, INR in the last 72 hours.   Assessment/Plan: 3 weeks post op I&D lumbar and lavage of left knee for staph m     Advance diet Up with therapy Continue ABX therapy due to Post-op infection Suture removed left lateral knee portal without diss Vira Browns 07/20/2018, 1:55 PMPatient ID: [name] [name], male   DOB: Nov 16, 1955, 62 y.o.   MRN: [id number]

## 2018-07-20 NOTE — Progress Notes (Signed)
Occupational Therapy Session Note  Patient Details  Name: [name] [name] MRN: [id number] Date of Birth: [birth year]  Today's Date: 07/20/2018 OT Individual Time: 4098-1191 OT Individual Time Calculation (min): 70 min    Short Term Goals: Week 2:  OT Short Term Goal 1 (Week 2): Pt will transfer to w/c with Max A of 2 in prep for [facility] transfer  OT Short Term Goal 2 (Week 2): Pt will complete 1/3 components of donning pants with AE as needed OT Short Term Goal 3 (Week 2): Pt will don footwear with Mod A using AE  Skilled Therapeutic Interventions/Progress Updates:    Upon entering the room, pt supine in bed awaiting OT arrival. Pt reports soreness but agreeable to OT intervention. OT rolling pt to L to don LB clothing but pt began urinating. Pt unaware of urination and does not feel "full". Pt rolled a second time to remove soiled brief and pt began urinating again. OT notified RN and requested bladder scan. I & O done to empty bladder with OT providing assistance for positioning and reviewed goals. Rolling L <> R with max A and use of bed rails for hygiene and LB clothing management with +2 assistance. Maxi move to transfer pt from bed >wheelchair. Pt propelled himself to sink for grooming tasks with supervision/set up. Pt able to don pull over shirt with set up A this session!. Pt remained seated in wheelchair for B UE strengthening exercises. Pt performing 2 sets of 10 scapular retraction, depression,and elevation with rest breaks as needed. B UE AROM for alternating punches from chest and bicep curls 2 sets of 10. Pt remained seated in wheelchair at end of session with call bell and all needed items within reach.   Therapy Documentation Precautions:  Precautions Precautions: Fall Precaution Booklet Issued: No Precaution Comments: wound VAC; Restrictions Weight Bearing Restrictions: No   Pain: Pain Assessment Pain Scale: 0-10 Pain Score: 9  Pain Type: Acute pain Pain Location: Back Pain  Orientation: Lower Pain Descriptors / Indicators: Aching;Throbbing Pain Frequency: Intermittent Pain Onset: Progressive Patients Stated Pain Goal: 2 Pain Intervention(s): Medication (See eMAR)   Therapy/Group: Individual Therapy  Alen Bleacher 07/20/2018, 11:50 AM

## 2018-07-20 NOTE — Consult Note (Signed)
WOC Nurse wound follow up Wound type: Surgical  Measurement: 9cm x 4cm x 5cm Wound bed: beefy red, moist Drainage (amount, consistency, odor) small amount serosanguinous Periwound: intact Dressing procedure/placement/frequency: NPWT dressing removed, two pieces of black foam removed (one from wound and one as bridge). Wound rinsed with NS, gently patted dry. One pice of black foam used to obliterate dead space, one piece used to bridge TRAC pad to right hip. Drape applied and TRAC pad attached to distal end of bridged foam. An immediate seal, is achieved.  Cannister requires changing today (>75% full), but there are none in the room.  Bedside RN to change cannister later today (one is ordered).  There is supply for next dressing change at bedside.  WOC nursing team will follow along for three times weekly NPWT dressing changes with bridging to hip, and will remain available to this patient, the nursing and medical teams.   Thanks, Ladona Mow, MSN, RN, GNP, [place]  Pager# [phone]

## 2018-07-20 NOTE — Progress Notes (Signed)
Physical Therapy Session Note  Patient Details  Name: [name] [name] MRN: [id number] Date of Birth: 10/01/1956  Today's Date: 07/20/2018 PT Individual Time: 4098-1191 PT Individual Time Calculation (min): 54 min   Short Term Goals: Week 2:  PT Short Term Goal 1 (Week 2): Pt will complete rolling L/R with bed features and min A PT Short Term Goal 2 (Week 2): Pt will transfer sit to stand with assist x 1  PT Short Term Goal 3 (Week 2): Pt will complete least restrictive transfer bed to/from w/c with assist x 1 PT Short Term Goal 4 (Week 2): Pt will propel manual w/c x 30 ft for strengthening and cardiovascular endurance  Skilled Therapeutic Interventions/Progress Updates:    pt in bed c/o pain but willing to participate. Maxi sky for all transfers during session.  Seated on edge of mat with maxi slide underneath pt, pt able to perform lateral scoots with min A, assist to move LEs laterally and cues for wt shifts. Pt limited in this activity due to back pain.  Pt performs seated ball toss and ball kick for core and LE strength and balance. Pt able to maintain sitting balance without LE support with CGA. Pt able to perform ball toss/kick x 2 minutes at a time before fatigued and requiring rest.  Pt left in w/c with needs at hand, wife present.  Therapy Documentation Precautions:  Precautions Precautions: Fall Precaution Booklet Issued: No Precaution Comments: wound VAC; Restrictions Weight Bearing Restrictions: No Pain: Pt c/o 8/10 in Rt lower back. Pt repositioned and given rests during the session. PT offered heat or ice, pt refuses at this time. Pain meds not due until later.   Therapy/Group: Individual Therapy  Duval Macleod 07/20/2018, 1:54 PM

## 2018-07-21 ENCOUNTER — Inpatient Hospital Stay (HOSPITAL_COMMUNITY): Payer: No Typology Code available for payment source | Admitting: Physical Therapy

## 2018-07-21 ENCOUNTER — Inpatient Hospital Stay (HOSPITAL_COMMUNITY): Payer: No Typology Code available for payment source | Admitting: Occupational Therapy

## 2018-07-21 ENCOUNTER — Ambulatory Visit (HOSPITAL_COMMUNITY): Payer: No Typology Code available for payment source | Admitting: Occupational Therapy

## 2018-07-21 LAB — CBC
HCT: 26.2 % — ABNORMAL LOW (ref 39.0–52.0)
Hemoglobin: 7.9 g/dL — ABNORMAL LOW (ref 13.0–17.0)
MCH: 27 pg (ref 26.0–34.0)
MCHC: 30.2 g/dL (ref 30.0–36.0)
MCV: 89.4 fL (ref 80.0–100.0)
PLATELETS: 415 10*3/uL — AB (ref 150–400)
RBC: 2.93 MIL/uL — AB (ref 4.22–5.81)
RDW: 15.4 % (ref 11.5–15.5)
WBC: 7.9 10*3/uL (ref 4.0–10.5)

## 2018-07-21 NOTE — Progress Notes (Signed)
Nutrition Follow-up  DOCUMENTATION CODES:   Morbid obesity  INTERVENTION:   - Ensure Enlive to po daily, each supplement provides 350 kcal and 20 grams of protein (chocolate or vanilla flavor)  - Continue MVI with minerals daily  NUTRITION DIAGNOSIS:   Increased nutrient needs related to wound healing, post-op healing as evidenced by estimated needs.  Ongoing  GOAL:   Patient will meet greater than or equal to 90% of their needs  Progressing  MONITOR:   PO intake, Supplement acceptance, Labs, Weight trends, Skin, I & O's  REASON FOR ASSESSMENT:   Malnutrition Screening Tool    ASSESSMENT:   [name] [name] is a 62 year old right-handed male admitted 06/28/2018 with progressive lower extremity weakness as well as falls.  9/15 - s/p paraspinal fluid collection 9/16 - s/p lumbar wound debridement drainage and irrigation, aspiration of lt knee, and arthroscopy of lt knee utilizing portals for irrigation and drainage of lt knee pyarthrosis  9/18 - s/p TEE  Spoke with pt who was watching TV in wheelchair at time of visit. Pt states that he is doing well even though he is still experiencing pain.  Pt reports that his appetite is back to his baseline. However, have noticed meal completion has decreased over the past 3 days. Will continue Ensure Enlive daily. Pt states he is drinking the Ensure "every now and then." RD encouraged PO intake and supplement intake.  Meal Completion: 25-100% (decreased recently)  Medications reviewed and include: Dulcolax daily, Oscal with D daily, Ensure Enlive daily, Fergon daily, MVI with minerals daily, Protonix 40 mg BID, K-dur 20 mEq BID, Florastor, vitamin B-12 1000 mcg daily, IV antibiotics  Labs reviewed: hemoglobin 7.9 (L), HCT 26.2 (L)  UOP: 4375 ml x 24 hours I/O's: -12.3 L since admit  Diet Order:   Diet Order            Diet regular Room service appropriate? Yes; Fluid consistency: Thin  Diet effective now               EDUCATION NEEDS:   Education needs have been addressed  Skin:  Skin Assessment: Skin Integrity Issues: Wound Vac: back Incisions: lt knee  Last BM:  10/7  Height:   Ht Readings from Last 1 Encounters:  07/09/18 5\' 10"  (1.778 m)    Weight:   Wt Readings from Last 1 Encounters:  07/15/18 (!) 189.6 kg    Ideal Body Weight:  75.4 kg  BMI:  Body mass index is 59.98 kg/m.  Estimated Nutritional Needs:   Kcal:  2200-2400  Protein:  125-150 grams  Fluid:  > 2.2 L    [name] Reading, MS, RD, LDN Inpatient Clinical Dietitian Pager: [phone] Weekend/After Hours: [phone]

## 2018-07-21 NOTE — Progress Notes (Signed)
Occupational Therapy Session Note  Patient Details  Name: [name] [name] MRN: [id number] Date of Birth: 1955/11/26  Today's Date: 07/21/2018 OT Individual Time: 6962-9528 and 4132-4401 OT Individual Time Calculation (min): 70 min and 27 min   Short Term Goals: Week 2:  OT Short Term Goal 1 (Week 2): Pt will transfer to w/c with Max A of 2 in prep for [facility] transfer  OT Short Term Goal 2 (Week 2): Pt will complete 1/3 components of donning pants with AE as needed OT Short Term Goal 3 (Week 2): Pt will don footwear with Mod A using AE  Skilled Therapeutic Interventions/Progress Updates:    Session 1:  Upon entering the room, pt supine in bed with no c/o pain this session and agreeable to OT intervention. OT educated pt on use of LH reacher to thread pants onto  B LEs from bed level. Pt required min A to pull pants over heel and then able to adjust and pull towards hips. Pt rolling L <> R with max A and assistance to pull pants over B hips. Maxi sky sling placed under pt and transferred to wheelchair. Pt propelled wheelchair to sink for UB dressing tasks with set up A and pt grooming with supervision overall. Pt remained in wheelchair with call bell and all needed items within reach upon exiting the room.   Session 2: Upon entering the room, pt seated in wheelchair with no c/o pain this session. Skilled OT intervention with focus on B UE and B LE strengthening. Pt utilized leg lifter to assist himself with LE exercises x 10 reps each. Pt taking rest breaks secondary to fatigue and min cues given for pursed lip breathing with exercise. Plans made for tomorrows session to utilize leg lifter to manipulate LEs in bed to come into sitting. Pt remained seated in wheelchair with wife present in room. Call bell and all needed items within reach.   Therapy Documentation Precautions:  Precautions Precautions: Fall Precaution Booklet Issued: No Precaution Comments: wound VAC; Restrictions Weight Bearing  Restrictions: No  Pain: Pain Assessment Pain Scale: 0-10 Pain Score: 6  Pain Type: Acute pain Pain Location: Back Pain Orientation: Lower Pain Descriptors / Indicators: Aching;Dull;Discomfort Pain Frequency: Constant Pain Onset: On-going Pain Intervention(s): Medication (See eMAR)   Therapy/Group: Individual Therapy  Alen Bleacher 07/21/2018, 12:51 PM

## 2018-07-21 NOTE — Progress Notes (Signed)
Physical Therapy Session Note  Patient Details  Name: [name] [name] MRN: [id number] Date of Birth: 05/22/1956  Today's Date: 07/21/2018 PT Individual Time: 1400-1430 PT Individual Time Calculation (min): 30 min   Short Term Goals: Week 2:  PT Short Term Goal 1 (Week 2): Pt will complete rolling L/R with bed features and min A PT Short Term Goal 2 (Week 2): Pt will transfer sit to stand with assist x 1  PT Short Term Goal 3 (Week 2): Pt will complete least restrictive transfer bed to/from w/c with assist x 1 PT Short Term Goal 4 (Week 2): Pt will propel manual w/c x 30 ft for strengthening and cardiovascular endurance  Skilled Therapeutic Interventions/Progress Updates:    pt transferred with maxi sky to nustep. Pt able to perform nustep 2 x 5 minutes.  Pt performs rolling in bed with mod A to remove sling after transferred back to bed. Pt left in bed with wife present, needs at hand.  Therapy Documentation Precautions:  Precautions Precautions: Fall Precaution Booklet Issued: No Precaution Comments: wound VAC; Restrictions Weight Bearing Restrictions: No Pain: Pt with no c/o pain during session   Therapy/Group: Individual Therapy  Meilin Brosh 07/21/2018, 2:32 PM

## 2018-07-21 NOTE — Progress Notes (Signed)
Physical Therapy Session Note  Patient Details  Name: [name] [name] MRN: [id number] Date of Birth: 03-15-1956  Today's Date: 07/21/2018 PT Individual Time: 1000-1100 PT Individual Time Calculation (min): 60 min   Short Term Goals: Week 2:  PT Short Term Goal 1 (Week 2): Pt will complete rolling L/R with bed features and min A PT Short Term Goal 2 (Week 2): Pt will transfer sit to stand with assist x 1  PT Short Term Goal 3 (Week 2): Pt will complete least restrictive transfer bed to/from w/c with assist x 1 PT Short Term Goal 4 (Week 2): Pt will propel manual w/c x 30 ft for strengthening and cardiovascular endurance  Skilled Therapeutic Interventions/Progress Updates:    c/o 5/10 pain in L knee and back, declines intervention.  Session focus on activity tolerance, LE NMR, and transfers.    Pt transferred w/c<>nustep with maxisky and +2 assist.  Nustep 2x5 minutes focus on active hip adduction and quad activation with manual facilitation to achieve full range.  Rest break between trials due to fatigue and pain.  Slide board transfer w/c<>mat (L>R and R>L) with +2 assist for boosting going towards the R, and +2 for safety (max +1 for boosting) going towards the L.  Pt completes chest presses with 1# dowel rod for UE and core strengthening.  PT adjusted w/c for dumped position to improve sitting tolerance and safety when in w/c.  Pt returned to room at end of session and positioned in w/c with call bell in reach and needs met.   Therapy Documentation Precautions:  Precautions Precautions: Fall Precaution Booklet Issued: No Precaution Comments: wound VAC; Restrictions Weight Bearing Restrictions: No    Therapy/Group: Individual Therapy  [name] 07/21/2018, 12:13 PM

## 2018-07-21 NOTE — Progress Notes (Signed)
[facility] PHYSICAL MEDICINE & REHABILITATION PROGRESS NOTE   Subjective/Complaints: Had back pain yesterday which inhibited his participation in therapy  ROS: Patient denies fever, rash, sore throat, blurred vision, nausea, vomiting, diarrhea, cough, shortness of breath or chest pain, joint or back pain, headache, or mood change.   Objective:   No results found. Recent Labs    07/21/18 0335  WBC 7.9  HGB 7.9*  HCT 26.2*  PLT 415*   Recent Labs    07/20/18 0400  NA 138  K 3.9  CL 105  CO2 26  GLUCOSE 99  BUN 9  CREATININE 0.66  CALCIUM 8.0*    Intake/Output Summary (Last 24 hours) at 07/21/2018 0906 Last data filed at 07/21/2018 0515 Gross per 24 hour  Intake 540 ml  Output 4375 ml  Net -3835 ml     Physical Exam: Vital Signs Blood pressure (!) 155/95, pulse 91, temperature 98.9 F (37.2 C), temperature source Oral, resp. rate 18, height 5\' 10"  (1.778 m), weight (!) 189.6 kg, SpO2 98 %.  Constitutional: No distress . Vital signs reviewed. HEENT: EOMI, oral membranes moist Neck: supple Cardiovascular: RRR without murmur. No JVD    Respiratory: CTA Bilaterally without wheezes or rales. Normal effort    GI: BS +, non-tender, non-distended  Musc: Lower extremity edema. Left shoulder with less pain during PROM/AROM Neurological: He isalertand oriented  Motor:  5/5 in the right deltoid, bicep, tricep, grip 2/5 right HE, KE, ADF, no change 2-/5 LLE, no change Skin:Intact.  Warm and dry.  vac in place. Psych:pleasant and cooperative.   Assessment/Plan: 1. Functional deficits secondary to thoracic SCI, septic arthritis which require 3+ hours per day of interdisciplinary therapy in a comprehensive inpatient rehab setting.  Physiatrist is providing close team supervision and 24 hour management of active medical problems listed below.  Physiatrist and rehab team continue to assess barriers to discharge/monitor patient progress toward functional and medical  goals  Care Tool:  Bathing    Body parts bathed by patient: Left arm, Chest, Abdomen, Face   Body parts bathed by helper: Front perineal area, Buttocks, Right arm, Right upper leg, Left upper leg, Right lower leg, Left lower leg     Bathing assist Assist Level: Maximal Assistance - Patient 24 - 49%     Upper Body Dressing/Undressing Upper body dressing Upper body dressing/undressing activity did not occur (including orthotics): Safety/medical concerns What is the patient wearing?: Pull over shirt    Upper body assist Assist Level: Set up assist    Lower Body Dressing/Undressing Lower body dressing      What is the patient wearing?: Pants     Lower body assist Assist for lower body dressing: 2 Helpers     Toileting Toileting Toileting Activity did not occur (Clothing management and hygiene only): N/A (no void or bm)  Toileting assist Assist for toileting: 2 Helpers     Transfers Chair/bed transfer  Transfers assist     Chair/bed transfer assist level: Dependent - mechanical lift     Locomotion Ambulation   Ambulation assist   Ambulation activity did not occur: Safety/medical concerns          Walk 10 feet activity   Assist           Walk 50 feet activity   Assist           Walk 150 feet activity   Assist           Walk 10 feet on uneven  surface  activity   Assist           Wheelchair     Assist   Type of Wheelchair: Manual Wheelchair activity did not occur: Safety/medical concerns  Wheelchair assist level: Supervision/Verbal cueing Max wheelchair distance: 19'    Wheelchair 50 feet with 2 turns activity    Assist            Wheelchair 150 feet activity     Assist          Medical Problem List and Plan: 1.Decreased functional mobilitysecondary to left C5 radiculopathy,  thoracic cord contusion with myelopathy/chronic radiculopathy-status post lumbar wound debridement drainage irrigation  9/17/2019with wound VAC placement, septic arthritis left knee MSSA bacteremia             -Continue CIR therapies, team conference 2. DVT Prophylaxis/Anticoagulation: Subcutaneous Lovenox.             -dopplers negative 3. Pain Management:Robaxin and hydrocodone as needed             -ACE wrap left knee for comfort as needed  -discussed back pain which will be an ongoing issue given complexity of situation 4. Mood:Cymbalta 60 mg daily. Provide emotional support 5. Neuropsych: This patientiscapable of making decisions on hisown behalf. 6. Skin/Wound Care:              -WOC RN assisting with vac---appreciate help! 7. Fluids/Electrolytes/Nutrition: encourage PO            8.ID/MSSA bacteremia. IV Ancef 2 g every 8 hours through 08/11/2018 and stoped. Follow-up infectious disease.  9.Septic arthritis left knee. Status post arthroscopy irrigation and drainage 10.Left shoulder full-thickness retracted supraspinatus tendon tear. Conservative care             - improvement in left shoulder pain s/p steroid injection 10/3   11.Acute on chronic anemia. Continue ferrous gluconate             Hemoglobin 8.0 on 10/5--> 7.9 today             Continue to monitor             -stool OB negative x1, repeat             -continue serial CBC's--check again tomorrow 12.BPH/urinary retention.  Flomax 0.4 mg daily, I/O cath prn 13.Constipation. stool "mushy" per staff--held senokot-s             -likely antibiotic effect             -Continue probiotic 13.  HTN             increased BP persistently  -initiated norvasc 5mg  daily 10/7 14.  Hypokalemia             Potassium 3.9 10/7                  LOS: 12 days A FACE TO FACE EVALUATION WAS PERFORMED  Ranelle Oyster 07/21/2018, 9:06 AM

## 2018-07-22 ENCOUNTER — Inpatient Hospital Stay (HOSPITAL_COMMUNITY): Payer: No Typology Code available for payment source | Admitting: Occupational Therapy

## 2018-07-22 ENCOUNTER — Inpatient Hospital Stay (HOSPITAL_COMMUNITY): Payer: No Typology Code available for payment source

## 2018-07-22 LAB — CBC
HEMATOCRIT: 24.7 % — AB (ref 39.0–52.0)
HEMOGLOBIN: 7.3 g/dL — AB (ref 13.0–17.0)
MCH: 26.2 pg (ref 26.0–34.0)
MCHC: 29.6 g/dL — ABNORMAL LOW (ref 30.0–36.0)
MCV: 88.5 fL (ref 80.0–100.0)
Platelets: 430 10*3/uL — ABNORMAL HIGH (ref 150–400)
RBC: 2.79 MIL/uL — AB (ref 4.22–5.81)
RDW: 15.4 % (ref 11.5–15.5)
WBC: 8.2 10*3/uL (ref 4.0–10.5)
nRBC: 0 % (ref 0.0–0.2)

## 2018-07-22 LAB — OCCULT BLOOD X 1 CARD TO LAB, STOOL: Fecal Occult Bld: NEGATIVE

## 2018-07-22 NOTE — Progress Notes (Signed)
Wound vac alarming and pt going to therapy.  Page to [facility], will come and see pt.

## 2018-07-22 NOTE — Progress Notes (Signed)
Occupational Therapy Session Note  Patient Details  Name: [name] [name] MRN: [id number] Date of Birth: March 19, 1956  Today's Date: 07/22/2018 OT Individual Time: 1100-1159 OT Individual Time Calculation (min): 59 min    Short Term Goals: Week 2:  OT Short Term Goal 1 (Week 2): Pt will transfer to w/c with Max A of 2 in prep for [facility] transfer  OT Short Term Goal 2 (Week 2): Pt will complete 1/3 components of donning pants with AE as needed OT Short Term Goal 3 (Week 2): Pt will don footwear with Mod A using AE  Skilled Therapeutic Interventions/Progress Updates:    Upon entering the room, pt seated in wheelchair and reports 7/10 pain in back. Pain medication given prior to OT arrival. Maxi sky utilized to transfer pt back to bed to assist with pain management. OT provided verbal cuing and encouragement for pursed lip breathing exercises. Pt verbalized decrease this session to 5/10. Wound vac leak this session requiring pt to roll to R with max A and use of bed rails while RN addressed concerns. Pt returning to supine. Pt utilized leg lifter to perform R LE ankle pumps x 20 reps and able to performing against gravity with L LE. Pt needing multiple rest breaks secondary to fatigue with all therapeutic exercise. Pt attempting to utilize leg lifter to bring LEs closer to EOB for bed mobility tasks. Pt able to bring LEs into figure four position but unable to move laterally towards EOB with lifter. Pt able to doff B socks from bed level with use of LH reacher and min verbal cuing for technique. Pt remained in bed to rest and for pain management. Call bell and all needed items within reach upon exiting the room.   Therapy Documentation Precautions:  Precautions Precautions: Fall Precaution Booklet Issued: No Precaution Comments: wound VAC; Restrictions Weight Bearing Restrictions: No General:   Vital Signs:   Pain: Pain Assessment Pain Scale: 0-10 Pain Score: 7  Pain Type: Acute pain Pain  Location: Back Pain Orientation: Lower Pain Descriptors / Indicators: Aching Pain Frequency: Constant Pain Onset: Other (Comment)(after walking simulation) Pain Intervention(s): Medication (See eMAR);Cold applied ADL:   Vision   Perception    Praxis   Exercises:   Other Treatments:     Therapy/Group: Individual Therapy  Alen Bleacher 07/22/2018, 12:32 PM

## 2018-07-22 NOTE — Progress Notes (Signed)
Pt reported that still interested in pain medication to take between 6hr time pd that can take PRN pain medications.  Pt reported 10 out of 10 pain.  Pt was with therapy and had been doing leg stretches.  Pain in lower back. Gv PRN pain medication and applied ice pack to lower with some relief.  Do believe that pain related to wound vac dressing change and possible not type of therapy.  Pt had refused to be medicated prior to dressing change. Pt reports that dressing change doesn't cause pain.  Will continue to monitor and reported to PA. He to discuss with MD.

## 2018-07-22 NOTE — Consult Note (Signed)
[facility] Nurse wound follow up:  Dr. Louanne Skye in to see at time of dressing change today Wound type: Surgical Measurement:8cm x 4xm x 4xm Wound PPH:KFEXM red, no bleeding today Drainage (amount, consistency, odor) moderate amount pink-tinged serous exudate in tubing Periwound:intact, dry Dressing procedure/placement/frequency: Dressing removed and 2 pieces of black foam discarded. Wound cleansed with NS, and gently patted dry. Periwound skin protected with drape, 1 piece of black foam used to fill dead space and 1 piece of black foam used to form bridge to right hip (2 pieces black foam total).  Dressings sealed with drape and TRAC pad applied. An immediate seal is achieved at 142mHg continuous negative pressure. Patient tolerated procedure well.  Supply for next dressing change in room (1 Medium black foam dressing kit and cannister.  Next dressing change is due on Friday, 07/24/18.  NB:  Dr. NLouanne Skyewould like to be present for dressing change on Wednesday, 07/29/18 at 8[place] will not follow, but will remain available to this patient, the nursing and medical teams.  Please re-consult if needed. Thanks, LMaudie Flakes MSN, RN, GGem CArther Abbott Pager# [phone]

## 2018-07-22 NOTE — Progress Notes (Signed)
     Subjective:   POD #23 Lumbar abscess incision drainage and debridement. Left Knee lavage with arthroscopic portals.   Patient reports pain as moderate.    Objective:   VITALS:  Temp:  [98.7 F (37.1 C)-99.9 F (37.7 C)] 99.2 F (37.3 C) (10/09 0424) Pulse Rate:  [86-89] 89 (10/09 0424) Resp:  [20-23] 20 (10/09 0424) BP: (110-143)/(64-72) 137/72 (10/09 0424) SpO2:  [98 %-100 %] 98 % (10/09 0424)  ABD soft Intact pulses distally Incision: VAC changed today, excellent 100% granulation. Neurologic unchanged right foot drop and gobally weak left leg with 4/5 motor return, neurogenic bladder.    LABS Recent Labs    07/21/18 0335 07/22/18 0453  HGB 7.9* 7.3*  WBC 7.9 8.2  PLT 415* 430*   Recent Labs    07/20/18 0400  NA 138  K 3.9  CL 105  CO2 26  BUN 9  CREATININE 0.66  GLUCOSE 99   No results for input(s): LABPT, INR in the last 72 hours.   Assessment/Plan:     Up with therapy Continue ABX therapy due to Post-op infection  Urinary intermittant cath for neurogenic bladder. Bowel regimen. SNF for post rehab care until incision and d/c of PICCU are Completed.    [name] [name] 07/22/2018, 8:34 AMPatient ID: [name] [name], male   DOB: [birth year], 62 y.o.   MRN: [id number]

## 2018-07-22 NOTE — Progress Notes (Signed)
Physical Therapy Session Note  Patient Details  Name: [name] [name] MRN: [id number] Date of Birth: October 09, 1956  Today's Date: 07/22/2018 PT Individual Time: 0915-1000 PT Individual Time Calculation (min): 45 min   Short Term Goals: Week 2:  PT Short Term Goal 1 (Week 2): Pt will complete rolling L/R with bed features and min A PT Short Term Goal 2 (Week 2): Pt will transfer sit to stand with assist x 1  PT Short Term Goal 3 (Week 2): Pt will complete least restrictive transfer bed to/from w/c with assist x 1 PT Short Term Goal 4 (Week 2): Pt will propel manual w/c x 30 ft for strengthening and cardiovascular endurance  Skilled Therapeutic Interventions/Progress Updates:    Pt supine in bed upon PT arrival, agreeable to therapy tx and denies pain. Pt performed rolling in both directions x 2 each way with max assist +2 in order to don brief and pants. Pt performed rolling in each direction with max assist +2 in order to place maxi sling. Pt transferred dependently using maximove with +2 assist from bed>w/c. While sitting in w/c pt able to posteriorly scoot with max assist. Seated in w/c pt dons shirt with supervision/set up to thread IV through. Seated in w/c therapist performed manual hamstring and heel cord stretches bilaterally. Pt performed partial ROM heel slides without shoes to slide x 10 bilaterally, isometric adduction holds 10x 5 sec each, hip abduction against slight manual resistance x 10. Pt left seated in w/c at end of session with needs in reach.   Therapy Documentation Precautions:  Precautions Precautions: Fall Precaution Booklet Issued: No Precaution Comments: wound VAC; Restrictions Weight Bearing Restrictions: No    Therapy/Group: Individual Therapy  Cresenciano Genre, PT, DPT 07/22/2018, 7:54 AM

## 2018-07-22 NOTE — Progress Notes (Signed)
Checked on pt, reset vac again, pt in bed and vac resealed.  Called WOC informed that wound vac was resealed. Will continue to monitor.

## 2018-07-22 NOTE — Progress Notes (Signed)
CHG done. Pt has not had any BM on this shift yet

## 2018-07-22 NOTE — Progress Notes (Addendum)
Physical Therapy Session Note  Patient Details  Name: [name] [name] MRN: [id number] Date of Birth: 19-Dec-1955  Today's Date: 07/22/2018 PT Individual Time: 1330-1456 PT Individual Time Calculation (min): 86 min   Short Term Goals:  Week 2:  PT Short Term Goal 1 (Week 2): Pt will complete rolling L/R with bed features and min A PT Short Term Goal 2 (Week 2): Pt will transfer sit to stand with assist x 1  PT Short Term Goal 3 (Week 2): Pt will complete least restrictive transfer bed to/from w/c with assist x 1 PT Short Term Goal 4 (Week 2): Pt will propel manual w/c x 30 ft for strengthening and cardiovascular endurance      Skilled Therapeutic Interventions/Progress Updates:   bed> NUSTep via MaxiSky lift +2.    Activity tolerance on NUStep at level 7 x 5 min x 2, using bil UEs and bil LEs, with limited excursion due to body habitus.  Gait belt around thighs to prevent R hip external rotation /knee bumping arm componenet of machine.  Transfer back to bed to readjust sling position.  Transfer to w/c.  Pt scooted backwards as far as possible in seat, then 2 pillows placed behind him to decrease sliding forward.    Therapeutic activity sitting in w/c, reaching out of BOS with R hand and reaching across midline to match playing cards onto board in front of pt, with L hand, pt to lean L and grasp card, but not reach across midline due to rotator cuff injury PTA.   W/c propulsion over level tile using bil UEs x 18', x 30'.  W/c back cushion added to increase support and decrease tendency to slide forward unsafely in w/c.   PT instructed pt on diaphragmatic breathing, using quick sniffs to activate the diaphragm, with fair caryy-over throughout session.   Pt transferred back to bed.  Rolling L><R using rail and mod assist to allow removal of sling.  Pt left resting in supine with needs at hand.    Therapy Documentation Precautions:  Precautions Precautions: Fall Precaution Booklet  Issued: No Precaution Comments: wound VAC; Restrictions Weight Bearing Restrictions: No  Pain: Pain Assessment "a bi of back pain.  I can work through it; I don't need medicine."     Therapy/Group: Individual Therapy  Liron Eissler 07/22/2018, 3:04 PM

## 2018-07-22 NOTE — Progress Notes (Signed)
Checked on pt in therapy, noticed wound vac not alarming.  Reset, informed therapy that wound vac must remain on despite alarm.  Wound vac can not be left in position that is off or turned off.

## 2018-07-22 NOTE — Progress Notes (Addendum)
[facility] PHYSICAL MEDICINE & REHABILITATION PROGRESS NOTE   Subjective/Complaints: No new complaints. Had a better day yesterday.   ROS: Patient denies fever, rash, sore throat, blurred vision, nausea, vomiting, diarrhea, cough, shortness of breath or chest pain, joint or back pain, headache, or mood change.    Objective:   No results found. Recent Labs    07/21/18 0335 07/22/18 0453  WBC 7.9 8.2  HGB 7.9* 7.3*  HCT 26.2* 24.7*  PLT 415* 430*   Recent Labs    07/20/18 0400  NA 138  K 3.9  CL 105  CO2 26  GLUCOSE 99  BUN 9  CREATININE 0.66  CALCIUM 8.0*    Intake/Output Summary (Last 24 hours) at 07/22/2018 0857 Last data filed at 07/22/2018 0424 Gross per 24 hour  Intake 480 ml  Output 2880 ml  Net -2400 ml     Physical Exam: Vital Signs Blood pressure 137/72, pulse 89, temperature 99.2 F (37.3 C), temperature source Oral, resp. rate 20, height 5\' 10"  (1.778 m), weight (!) 189.6 kg, SpO2 98 %.  Constitutional: No distress . Vital signs reviewed. HEENT: EOMI, oral membranes moist Neck: supple Cardiovascular: RRR without murmur. No JVD    Respiratory: CTA Bilaterally without wheezes or rales. Normal effort    GI: BS +, non-tender, non-distended  Musc: Lower extremity edema. Left shoulder more mobile with PROM/AROM Neurological: He isalertand oriented  Motor:  5/5 in the right deltoid, bicep, tricep, grip 2/5 right HE, KE, ADF, no change 2-/5 LLE, no change Skin:Intact.  Warm and dry. Back wound with pink granulation tissue. Psych:pleasant and cooperative.   Assessment/Plan: 1. Functional deficits secondary to thoracic SCI, septic arthritis which require 3+ hours per day of interdisciplinary therapy in a comprehensive inpatient rehab setting.  Physiatrist is providing close team supervision and 24 hour management of active medical problems listed below.  Physiatrist and rehab team continue to assess barriers to discharge/monitor patient progress  toward functional and medical goals  Care Tool:  Bathing    Body parts bathed by patient: Left arm, Chest, Abdomen, Face   Body parts bathed by helper: Front perineal area, Buttocks, Right arm, Right upper leg, Left upper leg, Right lower leg, Left lower leg     Bathing assist Assist Level: Maximal Assistance - Patient 24 - 49%     Upper Body Dressing/Undressing Upper body dressing Upper body dressing/undressing activity did not occur (including orthotics): Safety/medical concerns What is the patient wearing?: Pull over shirt    Upper body assist Assist Level: Set up assist    Lower Body Dressing/Undressing Lower body dressing      What is the patient wearing?: Pants     Lower body assist Assist for lower body dressing: Maximal Assistance - Patient 25 - 49%     Toileting Toileting Toileting Activity did not occur (Clothing management and hygiene only): N/A (no void or bm)  Toileting assist Assist for toileting: 2 Helpers     Transfers Chair/bed transfer  Transfers assist     Chair/bed transfer assist level: Dependent - mechanical lift     Locomotion Ambulation   Ambulation assist   Ambulation activity did not occur: Safety/medical concerns          Walk 10 feet activity   Assist           Walk 50 feet activity   Assist           Walk 150 feet activity   Assist  Walk 10 feet on uneven surface  activity   Assist           Wheelchair     Assist   Type of Wheelchair: Manual Wheelchair activity did not occur: Safety/medical concerns  Wheelchair assist level: Supervision/Verbal cueing Max wheelchair distance: 78'    Wheelchair 50 feet with 2 turns activity    Assist            Wheelchair 150 feet activity     Assist          Medical Problem List and Plan: 1.Decreased functional mobilitysecondary to left C5 radiculopathy,  thoracic cord contusion with myelopathy/chronic  radiculopathy-status post lumbar wound debridement drainage irrigation 9/17/2019with wound VAC placement, septic arthritis left knee MSSA bacteremia             -Continue CIR therapies including PT, OT   2. DVT Prophylaxis/Anticoagulation: Subcutaneous Lovenox.             -dopplers negative 3. Pain Management:Robaxin and hydrocodone as needed             -ACE wrap left knee for comfort as needed  -discussed back pain which will be an ongoing issue given complexity of situation 4. Mood:Cymbalta 60 mg daily. Provide emotional support 5. Neuropsych: This patientiscapable of making decisions on hisown behalf. 6. Skin/Wound Care: wound closing             -WOC RN assisting with vac---appreciate help!  -spoke with Dr. Otelia Sergeant, probably around 2 more weeks of vac therapy 7. Fluids/Electrolytes/Nutrition: encourage PO            8.ID/MSSA bacteremia. IV Ancef 2 g every 8 hours through 08/11/2018 and stoped. Follow-up infectious disease.  9.Septic arthritis left knee. Status post arthroscopy irrigation and drainage 10.Left shoulder full-thickness retracted supraspinatus tendon tear. Conservative care             - improvement in left shoulder pain s/p steroid injection 10/3   11.Acute on chronic anemia. Normocytic  -Continue ferrous gluconate  -I personally reviewed the patient's labs today.               Hemoglobin 8.0 on 10/5--> 7.9 --> 7.3 10/9             -stool OB negative x2             -continue serial CBC's  -dw ID today. Consider H/O consult  12.BPH/urinary retention.  Flomax 0.4 mg daily, I/O cath prn 13.Constipation. stool "mushy" per staff--held senokot-s             -likely antibiotic effect             -Continue probiotic 13.  HTN             increased BP persistently  -initiated norvasc 5mg  daily 10/7 14.  Hypokalemia             Potassium 3.9 10/7               Greater than 35 Minutes of time spent in examination, consultation and discussion of  treatment plan.    LOS: 13 days A FACE TO FACE EVALUATION WAS PERFORMED  Ranelle Oyster 07/22/2018, 8:57 AM

## 2018-07-23 ENCOUNTER — Inpatient Hospital Stay (HOSPITAL_COMMUNITY): Payer: No Typology Code available for payment source | Admitting: Occupational Therapy

## 2018-07-23 ENCOUNTER — Inpatient Hospital Stay (HOSPITAL_COMMUNITY): Payer: No Typology Code available for payment source | Admitting: Physical Therapy

## 2018-07-23 MED ORDER — HYDROCODONE-ACETAMINOPHEN 7.5-325 MG PO TABS
1.0000 | ORAL_TABLET | ORAL | Status: DC | PRN
  Administered 2018-07-23 – 2018-08-06 (×30): 1 via ORAL
  Filled 2018-07-23 (×31): qty 1

## 2018-07-23 NOTE — Patient Care Conference (Signed)
Inpatient RehabilitationTeam Conference and Plan of Care Update Date: 07/21/2018   Time: 2:15 PM    Patient Name: [name] [name]      Medical Record Number: [id number]  Date of Birth: November 26, 1955 Sex: Male         Room/Bed: 4W14C/4W14C-01 Payor Info: Payor: Advertising copywriter / Plan: GEHA / Product Type: *No Product type* /    Admitting Diagnosis: Epidural abcess  Admit Date/Time:  07/09/2018  5:13 PM Admission Comments: No comment available   Primary Diagnosis:  Thoracic myelopathy Principal Problem: Thoracic myelopathy  Patient Active Problem List   Diagnosis Date Noted  . Labile blood pressure   . Urinary retention   . Hypokalemia   . Hypertension   . Acute blood loss anemia   . Anemia of chronic disease   . Abscess in epidural space of lumbar spine   . Effusion, left knee   . Pain and swelling of wrist, left   . Complete tear of left rotator cuff 07/03/2018    Class: Chronic  . AKI (acute kidney injury) (HCC)   . MSSA bacteremia   . Septic arthritis of knee, left (HCC) 06/30/2018    Class: Acute  . Leukocytosis   . Postoperative seroma involving nervous system after nervous system procedure   . Shoulder pain, bilateral   . Thoracic myelopathy 06/28/2018  . Spondylosis, thoracic, with myelopathy 06/28/2018    Class: Acute  . Abscess in epidural space of L2-L5 lumbar spine 06/28/2018    Class: Acute  . Acute urinary retention 06/28/2018  . Left cervical radiculopathy 06/28/2018    Class: Acute  . Spinal stenosis of lumbar region 11/17/2017    Class: Chronic  . Status post lumbar laminectomy 11/17/2017  . Lumbar stenosis with neurogenic claudication 08/18/2017  . Forestier's disease of thoracolumbar region 08/18/2017  . Degenerative disc disease, lumbar 08/18/2017  . Lumbar radiculopathy 09/30/2016  . Morbid (severe) obesity due to excess calories (HCC) 09/30/2016  . Biliary calculus with acute cholecystitis 03/21/2016  . Acute pyelonephritis 12/28/2013  . Ureteral  stone with hydronephrosis 12/28/2013  . Acute kidney failure (HCC) 12/27/2013  . Severe sepsis with acute organ dysfunction (HCC) 12/27/2013  . Hypotension 12/27/2013  . Lactic acidosis 12/27/2013  . Sepsis secondary to UTI (HCC) 12/27/2013  . EMPYEMA CHEST 08/30/2008  . PRURIGO 08/30/2008  . OBSTRUCTIVE SLEEP APNEA 08/30/2008    Expected Discharge Date: Expected Discharge Date: (~3 weeks)  Team Members Present: Physician leading conference: Dr. Faith Rogue Social Worker Present: Staci Acosta, LCSW Nurse Present: Willey Blade, RN PT Present: Teodoro Kil, PT OT Present: Callie Fielding, OT PPS Coordinator present : Tora Duck, RN, CRRN     Current Status/Progress Goal Weekly Team Focus  Medical   slow functional progress due to pain, size, and neuro deficits  rx pain.   left shoulder pain (rx'ed with injection), wound care, blood counts   Bowel/Bladder   Incontinent of bowel, In and Out cath  - unable to urinate  Become continent of bowel and able to urinate on his own  Assess bowel and bladder needs q 4 hrs and as needed. Offer toileting q2hrs   Swallow/Nutrition/ Hydration             ADL's   +2 for Max Sky transfers + LB self care bedlevel. Supervision UB dressing. On night baths.  Min-Mod A overall   Functional transfers, general strengthening and endurance, NMR, pt education, balance,    Mobility   total +2 with maxisky for transfers  mod assist w/c level  transfers, strength, w/c mobility   Communication             Safety/Cognition/ Behavioral Observations  A/O x4,          Pain   Pain level of 5/10 on pain scale 0 to 10  pain level of 2 or less  Assess pain q 4hrs and PRN and medicate as needed   Skin   Scattered/ generalized old chemical burns and wound vac at 100 mmHg  Remains free from skin breakdown and infection       Rehab Goals Patient on target to meet rehab goals: Yes(but mainly mod A will be needed post d/c) Rehab Goals Revised: none *See  Care Plan and progress notes for long and short-term goals.     Barriers to Discharge  Current Status/Progress Possible Resolutions Date Resolved   Physician    Medical stability        see medical progress notes      Nursing                  PT                    OT                  SLP                SW                Discharge Planning/Teaching Needs:  Pt plans to return to his home at d/c with his wife and friends to assist as needed.  Wife needs for pt to be as physically capable as possible, but feels she could do min/mod assist.  Wife plans to see pt's progress over this next week to see if he progresses more.  She really hopes and plans to be able to manage pt's care at home.  Wife to come for family education closer to pt's d/c.   Team Discussion:  Pt with pain in his shoulder and his back.  His low blood count is still being monitored by Dr. Riley Kill.  Bowel and bladder function is still a problem.  Pt is needing in and out caths and every other day bowel program.  Pt is still total A for all PT tasks and using maxi sky.  Therapists want pt to get to slide board txs; taking 2 people now.  Therapists are concerned if wife can do moderate assist care.  Pt is making some progress with OT and arms are getting stronger.  He can push w/c and is doing UB self care at sink with supervision and adaptive equipment being tried.  Revisions to Treatment Plan:  none    Continued Need for Acute Rehabilitation Level of Care: The patient requires daily medical management by a physician with specialized training in physical medicine and rehabilitation for the following conditions: Daily direction of a multidisciplinary physical rehabilitation program to ensure safe treatment while eliciting the highest outcome that is of practical value to the patient.: Yes Daily medical management of patient stability for increased activity during participation in an intensive rehabilitation regime.: Yes Daily  analysis of laboratory values and/or radiology reports with any subsequent need for medication adjustment of medical intervention for : Post surgical problems;Neurological problems   I attest that I was present, lead the team conference, and concur with the assessment and plan of the team.   Elysia Grand, [place] Deck 07/23/2018,  2:01 PM

## 2018-07-23 NOTE — Progress Notes (Signed)
Social Work Patient ID: [name] [name], male   DOB: 1956/02/07, 62 y.o.   MRN: [id number]   CSW met with pt's wife at pt's bedside, pt slept during visit.  CSW explained that team was still not ready to set a date for pt, but that he would likely need 3 weeks to meet mod A goals.  She still wants to try to care for pt at home and wants to see his progress over the next week before talking about other options.  CSW told her that CSW would be able to help them with that if we need to look into alternatives to home, like SNF.  She appreciated that we don't want to put too much care on her if she can't safely take care of him physically.  At this time, however, she is still hopeful for caring for him at home.  CSW will continue to follow and assist as needed.

## 2018-07-23 NOTE — Progress Notes (Signed)
[facility] PHYSICAL MEDICINE & REHABILITATION PROGRESS NOTE   Subjective/Complaints: Low back still giving him problems. Would like something more for pain.   ROS: Patient denies fever, rash, sore throat, blurred vision, nausea, vomiting, diarrhea, cough, shortness of breath or chest pain,  headache, or mood change.   Objective:   No results found. Recent Labs    07/21/18 0335 07/22/18 0453  WBC 7.9 8.2  HGB 7.9* 7.3*  HCT 26.2* 24.7*  PLT 415* 430*   No results for input(s): NA, K, CL, CO2, GLUCOSE, BUN, CREATININE, CALCIUM in the last 72 hours.  Intake/Output Summary (Last 24 hours) at 07/23/2018 0940 Last data filed at 07/23/2018 0700 Gross per 24 hour  Intake 600 ml  Output 3325 ml  Net -2725 ml     Physical Exam: Vital Signs Blood pressure (!) 148/86, pulse 88, temperature 99.3 F (37.4 C), temperature source Oral, resp. rate 18, height 5\' 10"  (1.778 m), weight (!) 189.6 kg, SpO2 100 %.  Constitutional: No distress . Vital signs reviewed. HEENT: EOMI, oral membranes moist Neck: supple Cardiovascular: RRR without murmur. No JVD    Respiratory: CTA Bilaterally without wheezes or rales. Normal effort    GI: BS +, non-tender, non-distended  Musc: Lower extremity edema. LB tender. Left shoulder with better PROM/AROM Neurological: He isalertand oriented  Motor:  5/5 in the right deltoid, bicep, tricep, grip 2/5 right HE, KE, ADF, no change 2-/5 LLE, no change Skin:Intact.  Warm and dry. Back wound with pink granulation tissue. Psych:pleasant and cooperative.   Assessment/Plan: 1. Functional deficits secondary to thoracic SCI, septic arthritis which require 3+ hours per day of interdisciplinary therapy in a comprehensive inpatient rehab setting.  Physiatrist is providing close team supervision and 24 hour management of active medical problems listed below.  Physiatrist and rehab team continue to assess barriers to discharge/monitor patient progress toward  functional and medical goals  Care Tool:  Bathing    Body parts bathed by patient: Left arm, Chest, Abdomen, Face   Body parts bathed by helper: Front perineal area, Buttocks, Right arm, Right upper leg, Left upper leg, Right lower leg, Left lower leg     Bathing assist Assist Level: Maximal Assistance - Patient 24 - 49%     Upper Body Dressing/Undressing Upper body dressing Upper body dressing/undressing activity did not occur (including orthotics): Safety/medical concerns What is the patient wearing?: Pull over shirt    Upper body assist Assist Level: Set up assist    Lower Body Dressing/Undressing Lower body dressing      What is the patient wearing?: Pants     Lower body assist Assist for lower body dressing: Maximal Assistance - Patient 25 - 49%     Toileting Toileting Toileting Activity did not occur (Clothing management and hygiene only): N/A (no void or bm)  Toileting assist Assist for toileting: 2 Helpers     Transfers Chair/bed transfer  Transfers assist     Chair/bed transfer assist level: Dependent - mechanical lift     Locomotion Ambulation   Ambulation assist   Ambulation activity did not occur: Safety/medical concerns          Walk 10 feet activity   Assist           Walk 50 feet activity   Assist           Walk 150 feet activity   Assist           Walk 10 feet on uneven surface  activity  Assist           Wheelchair     Assist Will patient use wheelchair at discharge?: Yes Type of Wheelchair: Manual Wheelchair activity did not occur: Safety/medical concerns  Wheelchair assist level: Supervision/Verbal cueing Max wheelchair distance: 30    Wheelchair 50 feet with 2 turns activity    Assist            Wheelchair 150 feet activity     Assist          Medical Problem List and Plan: 1.Decreased functional mobilitysecondary to left C5 radiculopathy,  thoracic cord contusion  with myelopathy/chronic radiculopathy-status post lumbar wound debridement drainage irrigation 9/17/2019with wound VAC placement, septic arthritis left knee MSSA bacteremia             -Continue CIR therapies including PT, OT   2. DVT Prophylaxis/Anticoagulation: Subcutaneous Lovenox.             -dopplers negative 3. Pain Management:Robaxin and hydrocodone as needed  -will change hydrocodone to 7.5mg  q4 prn for better pain relief             -ACE wrap left knee for comfort as needed  -discussed back pain which will be an ongoing issue given complexity of situation 4. Mood:Cymbalta 60 mg daily. Provide emotional support 5. Neuropsych: This patientiscapable of making decisions on hisown behalf. 6. Skin/Wound Care: wound closing             -WOC RN assisting with vac---appreciate help!  -2 more weeks of vac? 7. Fluids/Electrolytes/Nutrition: push po  -re-check labs next monday            8.ID/MSSA bacteremia. IV Ancef 2 g every 8 hours through 08/11/2018 and stoped. Follow-up infectious disease.  9.Septic arthritis left knee. Status post arthroscopy irrigation and drainage 10.Left shoulder full-thickness retracted supraspinatus tendon tear. Conservative care             - improvement in left shoulder pain s/p steroid injection 10/3   11.Acute on chronic anemia. Normocytic  -Continue ferrous gluconate  -I personally reviewed the patient's labs today.               Hemoglobin 8.0 on 10/5--> 7.9 --> 7.3 10/9             -stool OB negative x2             -continue serial CBC's---recheck tomorrow  -  Consider H/O consult  12.BPH/urinary retention.  Flomax 0.4 mg daily, I/O cath prn 13.Constipation. stool "mushy" per staff--held senokot-s             -likely antibiotic effect             -Continue probiotic 13.  HTN             some improvement  -initiated norvasc 5mg  daily 10/7 14.  Hypokalemia             Potassium 3.9 10/7                    LOS: 14  days A FACE TO FACE EVALUATION WAS PERFORMED  Ranelle Oyster 07/23/2018, 9:40 AM

## 2018-07-23 NOTE — Progress Notes (Addendum)
Physical Therapy Session Note  Patient Details  Name: [name] [name] MRN: [id number] Date of Birth: 10-Mar-1956  Today's Date: 07/23/2018 PT Individual Time: 4098-1191 and 4782-9562 PT Individual Time Calculation (min): 23 min and 58 minutes  Short Term Goals: Week 2:  PT Short Term Goal 1 (Week 2): Pt will complete rolling L/R with bed features and min A PT Short Term Goal 2 (Week 2): Pt will transfer sit to stand with assist x 1  PT Short Term Goal 3 (Week 2): Pt will complete least restrictive transfer bed to/from w/c with assist x 1 PT Short Term Goal 4 (Week 2): Pt will propel manual w/c x 30 ft for strengthening and cardiovascular endurance  Skilled Therapeutic Interventions/Progress Updates:    session focused on w/c mobility for endurance training and UE strengthening. Pt propels up to 40' at a time with bilat UEs with supervision. Pt requires frequent rest breaks due to pain and decreased endurance. Pt left in w/c with nursing present, needs at hand.  Session 2: pt with no c/o pain during this session.  Pt transferred with maxi sky to w/c.  Pt performs sliding board transfer training to mat in gym with +2 mod A.  Pt improving ability to use UEs for scooting on sliding board.  Continues to need assistance for forward wt shifts and LE placement.  Seated on mat pt performs mini crunches and lateral bends for abdominal and core strengthening.  AAROM for Rt LE hip and knee strength in all planes.  Pt transported back to bed per nursing request at end of session.   Therapy Documentation Precautions:  Precautions Precautions: Fall Precaution Booklet Issued: No Precaution Comments: wound VAC; Restrictions Weight Bearing Restrictions: No Pain: Pain Assessment Pain Scale: 0-10 Pain Score: 7  Pain Type: Acute pain Pain Location: Back Pain Orientation: Lower Pain Descriptors / Indicators: Aching;Discomfort;Dull Pain Frequency: Constant Pain Intervention(s):RN made aware, meds  given   Therapy/Group: Individual Therapy  Empress Newmann 07/23/2018, 9:55 AM

## 2018-07-23 NOTE — Progress Notes (Signed)
Occupational Therapy Session Note  Patient Details  Name: [name] [name] MRN: [id number] Date of Birth: [birth year]  Today's Date: 07/23/2018 OT Individual Time: 5621-3086 OT Individual Time Calculation (min): 72 min    Short Term Goals: Week 2:  OT Short Term Goal 1 (Week 2): Pt will transfer to w/c with Max A of 2 in prep for [facility] transfer  OT Short Term Goal 2 (Week 2): Pt will complete 1/3 components of donning pants with AE as needed OT Short Term Goal 3 (Week 2): Pt will don footwear with Mod A using AE  Skilled Therapeutic Interventions/Progress Updates:    Upon entering the room, pt supine in bed with RN present giving medications. Pt with no c/o pain this session and agreeable to OT intervention. Pt supine in bed upon entering the room with RN present and giving medications. Pt with no c/o pain this session and motivated for OT intervention. Session focused on AE training for self care, bed mobility, and self care. Pt's HOB elevated and pt able to thread pants over B feet with reacher, increased effort, multiple rest breaks from fatigue, and increased time. Pt rolling L <> R to pull pants over B hips but pt incontinent of bowel and required total A for hygiene. Bed mobility with max A this session for rolling for clothing management to pull over hips and hygiene. Pt transferred into wheelchair with use of maxi sky for safety. Pt scooting bottom back in wheelchair for safety and wound vac coming off. RN notified. She placed consult and turned off vac. Pt propelled wheelchair with supervision and B UEs to sink for grooming tasks. Call bell and all needed items within reach upon exiting the room.   Therapy Documentation Precautions:  Precautions Precautions: Fall Precaution Booklet Issued: No Precaution Comments: wound VAC; Restrictions Weight Bearing Restrictions: No  Pain: Pain Assessment Pain Scale: 0-10 Pain Score: 7  Pain Type: Acute pain Pain Location: Back Pain Orientation:  Lower Pain Descriptors / Indicators: Aching;Discomfort;Dull Pain Frequency: Constant Pain Intervention(s): Medication (See eMAR)   Therapy/Group: Individual Therapy  Alen Bleacher 07/23/2018, 12:48 PM

## 2018-07-23 NOTE — Discharge Summary (Signed)
Patient ID: [name] [name] MRN: [id number] DOB/AGE: [birth year] 62 y.o.  Admit date: 06/27/2018 Discharge date: 07/23/2018  Admission Diagnoses:  Principal Problem:   Spondylosis, thoracic, with myelopathy Active Problems:   Thoracic myelopathy   Abscess in epidural space of L2-L5 lumbar spine   Acute urinary retention   Left cervical radiculopathy   Leukocytosis   Postoperative seroma involving nervous system after nervous system procedure   Shoulder pain, bilateral   Septic arthritis of knee, left (HCC)   Complete tear of left rotator cuff   AKI (acute kidney injury) (HCC)   MSSA bacteremia   Abscess in epidural space of lumbar spine   Effusion, left knee   Pain and swelling of wrist, left   Discharge Diagnoses:  Principal Problem:   Spondylosis, thoracic, with myelopathy Active Problems:   Thoracic myelopathy   Abscess in epidural space of L2-L5 lumbar spine   Acute urinary retention   Left cervical radiculopathy   Leukocytosis   Postoperative seroma involving nervous system after nervous system procedure   Shoulder pain, bilateral   Septic arthritis of knee, left (HCC)   Complete tear of left rotator cuff   AKI (acute kidney injury) (HCC)   MSSA bacteremia   Abscess in epidural space of lumbar spine   Effusion, left knee   Pain and swelling of wrist, left  status post Procedure(s): TRANSESOPHAGEAL ECHOCARDIOGRAM (TEE)  Past Medical History:  Diagnosis Date  . Anemia   . Arthritis   . Chronic kidney disease    HX acute kidney failure / acute pyelonephritis / hydronephrosis / severe sepsis per discharge summary 12/27/13  . History of kidney stones   . Hypertension   . Morbid obesity (HCC)   . Obstructive sleep apnea    does not need c pap since 110 lb wt loss  . Osteoporosis   . Prurigo 2002  . Scars    ON ARMS FROM CHEMICAL EXPLOSION 1999  . Spinal stenosis     Surgeries: Procedure(s): TRANSESOPHAGEAL ECHOCARDIOGRAM (TEE) on 06/27/2018 - 07/01/2018    Consultants:   Discharged Condition: Improved  Hospital Course: [name] [name] is an 62 y.o. male who was admitted 06/27/2018 for operative treatment of Spondylosis, thoracic, with myelopathy. Patient failed conservative treatments (please see the history and physical for the specifics) and had severe unremitting pain that affects sleep, daily activities and work/hobbies. After pre-op [name], the patient was taken to the operating room on 06/27/2018 - 07/01/2018 and underwent  Procedure(s): TRANSESOPHAGEAL ECHOCARDIOGRAM (TEE).    Patient was given perioperative antibiotics:  Anti-infectives (From admission, onward)   Start     Dose/Rate Route Frequency Ordered Stop   06/29/18 1936  vancomycin (VANCOCIN) powder  Status:  Discontinued       As needed 06/29/18 1936 06/29/18 2121   06/29/18 1928  polymyxin B 500,000 Units, bacitracin 50,000 Units in sodium chloride 0.9 % 500 mL irrigation  Status:  Discontinued       As needed 06/29/18 1928 06/29/18 2121   06/29/18 1800  vancomycin (VANCOCIN) IVPB 1000 mg/200 mL premix  Status:  Discontinued     1,000 mg 200 mL/hr over 60 Minutes Intravenous  Once 06/29/18 0032 06/29/18 1042   06/29/18 1400  ceFAZolin (ANCEF) IVPB 2g/100 mL premix  Status:  Discontinued     2 g 200 mL/hr over 30 Minutes Intravenous Every 8 hours 06/29/18 1042 07/09/18 1713   06/29/18 0200  ceFEPIme (MAXIPIME) 2 g in sodium chloride 0.9 % 100 mL IVPB  Status:  Discontinued     2 g 200 mL/hr over 30 Minutes Intravenous Every 8 hours 06/29/18 0031 06/29/18 1042   06/29/18 0130  vancomycin (VANCOCIN) 2,000 mg in sodium chloride 0.9 % 500 mL IVPB     2,000 mg 250 mL/hr over 120 Minutes Intravenous  Once 06/29/18 0032 06/29/18 0257       Patient was given sequential compression devices and early ambulation to prevent DVT.   Patient benefited maximally from hospital stay and there were no complications. At the time of discharge, the patient was urinating/moving their bowels  without difficulty, tolerating a regular diet, pain is controlled with oral pain medications and they have been cleared by PT/OT.   Recent vital signs: No data found.   Recent laboratory studies:  Recent Labs    07/21/18 0335 07/22/18 0453  WBC 7.9 8.2  HGB 7.9* 7.3*  HCT 26.2* 24.7*  PLT 415* 430*     Discharge Medications:   Allergies as of 07/09/2018      Reactions   Unasyn [ampicillin-sulbactam Sodium] Hives, Itching, Swelling, Rash      Medication List    ASK your doctor about these medications   calcium citrate-vitamin D 315-200 MG-UNIT tablet Commonly known as:  CITRACAL+D Take 1 tablet by mouth daily.   DULoxetine 60 MG capsule Commonly known as:  CYMBALTA TAKE 1 CAPSULE(60 MG) BY MOUTH DAILY   meloxicam 15 MG tablet Commonly known as:  MOBIC Take 1 tablet (15 mg total) by mouth daily.   vitamin B-12 1000 MCG tablet Commonly known as:  CYANOCOBALAMIN Take 1,000 mcg by mouth daily.       Diagnostic Studies: Dg Chest 2 View  Result Date: 06/28/2018 CLINICAL DATA:  [name] [name] yesterday.  Chest pain. EXAM: CHEST - 2 VIEW COMPARISON:  12/29/2013 FINDINGS: The cardiac silhouette, mediastinal and hilar contours are within normal limits and stable. The lungs are clear. The bony thorax is grossly intact. Stable advanced right-sided glenohumeral joint degenerative changes. IMPRESSION: No acute cardiopulmonary findings. Electronically Signed   By: Rudie Meyer M.D.   On: 06/28/2018 17:30   Dg Cervical Spine With Flex & Extend  Result Date: 06/28/2018 CLINICAL DATA:  Fall yesterday with neck pain. EXAM: CERVICAL SPINE COMPLETE WITH FLEXION AND EXTENSION VIEWS COMPARISON:  None. FINDINGS: Note that the cervical spine can only be visualized down to the C6 level. Vertebral body alignment and heights are normal. There is mild to moderate spondylosis of the cervical spine. There is mild disc space narrowing at the C3-4, C4-5 and C5-6 levels. Prevertebral soft tissues are normal. No  visualized instability on flexion and extension. Atlantoaxial articulation is unremarkable. No acute fracture or subluxation. IMPRESSION: No acute findings. Note that the cervical spine is only visualized down to the C6 level. Mild spondylosis throughout the cervical spinal multilevel disc disease as described. Electronically Signed   By: Elberta Fortis M.D.   On: 06/28/2018 17:31   Dg Wrist 2 Views Left  Result Date: 06/29/2018 CLINICAL DATA:  Wrist pain.  Initial encounter. EXAM: LEFT WRIST - 2 VIEW COMPARISON:  None. FINDINGS: Marked soft tissue swelling is present over the dorsum of the wrist. This appears to be worse on the ulnar side. IMPRESSION: Extensive soft tissue swelling over the dorsum of the wrist without underlying fracture. Electronically Signed   By: Marin Roberts M.D.   On: 06/29/2018 15:56   Mr Cervical Spine Wo Contrast  Result Date: 06/29/2018 CLINICAL DATA:  Neck pain, LEFT biceps weakness. History of spinal cord  injury. EXAM: MRI CERVICAL SPINE WITHOUT CONTRAST TECHNIQUE: Multiplanar, multisequence MR imaging of the cervical spine was performed. No intravenous contrast was administered. COMPARISON:  Cervical spine radiograph June 28, 2018 and MRI thoracic spine June 28, 2018 FINDINGS: Mild motion degraded examination. ALIGNMENT: Straightened cervical lordosis.  No malalignment. VERTEBRAE/DISCS: Vertebral bodies are intact. Moderate C3-4 and C4-5 disc height loss, moderate to severe at C5-6 with proportional subacute on chronic discogenic endplate changes. Diffuse mild disc desiccation. No abnormal or acute bone marrow signal. Congenital canal narrowing on the basis of foreshortened pedicles. CORD:1 cm segment myelomalacia C5-6, faint subcentimeter myelomalacia C4-5. No cord edema or syrinx. POSTERIOR FOSSA, VERTEBRAL ARTERIES, PARASPINAL TISSUES: No MR findings of ligamentous injury. Vertebral artery flow voids present. Included posterior fossa and paraspinal soft  tissues are normal. DISC LEVELS: C2-3: Annular bulging and uncovertebral hypertrophy. Mild facet arthropathy. No canal stenosis. Moderate bilateral neural foraminal narrowing. C3-4: Moderate broad-based disc bulge, uncovertebral hypertrophy mild facet arthropathy. Moderate canal stenosis. Severe bilateral neural foraminal narrowing. C4-5: Moderate broad-based disc bulge, uncovertebral hypertrophy and mild facet arthropathy. Moderate canal stenosis. Severe bilateral neural foraminal narrowing. C5-6: Moderate broad-based disc bulge, uncovertebral hypertrophy and mild facet arthropathy. Severe canal stenosis, AP dimension of the canal is 4 mm. Severe bilateral neural foraminal narrowing. C6-7: Small broad-based disc bulge, uncovertebral hypertrophy and mild facet arthropathy. Moderate canal stenosis. Moderate RIGHT and severe LEFT neural foraminal narrowing. C7-T1: No disc bulge, canal stenosis nor neural foraminal narrowing. Mild LEFT facet arthropathy. IMPRESSION: 1. Motion degraded examination. 2. Short segment myelomalacia at C4-5 and C5-6.  No syrinx. 3. Degenerative change of the cervical spine superimposed on congenital canal narrowing. Severe canal stenosis C5-6; moderate canal stenosis C3-4, C4-5 and C6-7. 4. Neural foraminal narrowing C2-3 through C6-7: Severe from C3-4 through C6-7. Electronically Signed   By: Awilda Metro M.D.   On: 06/29/2018 01:03   Mr Thoracic Spine Wo Contrast  Result Date: 06/28/2018 CLINICAL DATA:  Worsening low back pain radiating to bilateral lower extremity with numbness for several months. RIGHT leg weakness today, bilateral lower extremity paralysis for 2 days. Status post lumbar spine surgery February 2019. EXAM: MRI THORACIC AND LUMBAR SPINE WITHOUT CONTRAST TECHNIQUE: Multiplanar and multiecho pulse sequences of the thoracic and lumbar spine were obtained without intravenous contrast. COMPARISON:  MRI lumbar spine July 29, 2017 FINDINGS: MRI THORACIC SPINE FINDINGS  ALIGNMENT: Maintenance of the thoracic kyphosis. No malalignment. VERTEBRAE/DISCS: Vertebral bodies are intact. Severe disc height loss T3-4 through T 910 with diffuse disc desiccation and moderate subacute discogenic endplate changes. No acute or abnormal bone marrow signal. Borderline congenital canal narrowing on the basis of foreshortened pedicles. CORD: Subcentimeter holo cord T2 bright signal at T7-8. 12 mm segment T2 bright signal spinal cord at T9-10, potential myelomalacia. No syrinx. PREVERTEBRAL AND PARASPINAL SOFT TISSUES: Symmetric moderate to severe paraspinal muscle atrophy. DISC LEVELS: T1-2: No disc bulge. Mild facet arthropathy. No canal stenosis. Mild neural foraminal narrowing. T2-3: Small broad-based disc bulge with superimposed 4 mm LEFT central disc protrusion. Mild facet arthropathy. Moderate canal stenosis. Severe RIGHT and moderate LEFT neural foraminal narrowing. T3-4, T4-5: No disc bulge, canal stenosis nor neural foraminal narrowing. T5-6: Bilateral subarticular disc protrusions measuring to 5 mm on the LEFT. Mild facet arthropathy. Mild canal stenosis. Moderate to severe RIGHT and moderate LEFT neural foraminal narrowing. T6-7: Small broad-based disc bulge minimal facet arthropathy. No canal stenosis. Moderate RIGHT neural foraminal narrowing. T7-8: 3 mm broad-based disc bulge. Mild facet arthropathy. Mild canal stenosis. Mild neural  foraminal narrowing. T8-9: Small RIGHT subarticular disc protrusion. No canal stenosis. Moderate RIGHT and mild LEFT neural foraminal narrowing. T9-10: 5 mm broad-based disc bulge, moderate facet arthropathy. Moderate canal stenosis. Severe bilateral neural foraminal narrowing. T10-11: 3 mm broad-based disc bulge. Severe facet arthropathy. Moderate canal stenosis. Severe bilateral neural foraminal narrowing. T11-12: Annular bulging. Mild facet arthropathy. No canal stenosis or neural foraminal narrowing. T12-L1: No disc bulge. Moderate facet arthropathy  without canal stenosis. Mild LEFT neural foraminal narrowing. MRI LUMBAR SPINE FINDINGS-large body habitus results in decreased signal to noise ratio, mild motion degraded examination. SEGMENTATION: For the purposes of this report, the last well-formed intervertebral disc is reported as L5-S1. ALIGNMENT: Maintained lumbar lordosis. Similar minimal grade 1 L5-S1 anterolisthesis. Grade 1 L2-3 retrolisthesis. VERTEBRAE:Vertebral bodies are intact. Intervertebral discs demonstrate normal morphology, mild desiccation and superimposed mild disc edema lower lumbar discs. Moderate subacute discogenic endplate changes L5-S1. Multilevel mild subacute to chronic discogenic endplate changes. No suspicious bone marrow signal. CONUS MEDULLARIS AND CAUDA EQUINA: Conus medullaris terminates at L1-2 and demonstrates normal signal. Cauda equina not discretely identified. PARASPINAL AND OTHER SOFT TISSUES: Severe paraspinal muscle denervation/atrophy. Asymmetric LEFT distal iliopsoas muscle atrophy. 2.9 x 4.3 x 6.1 cm central paraspinal fluid collection L4 through S1 along surgical approach. DISC LEVELS: L1-2: Small broad-based disc bulge. Moderate RIGHT and moderate to severe LEFT facet arthropathy. No canal stenosis. Mild LEFT neural foraminal narrowing. L2-3: Retrolisthesis. Small broad-based disc bulge. Status post LEFT hemilaminectomy. Moderate facet arthropathy without canal stenosis. Moderate bilateral neural foraminal narrowing. L3-4: No disc bulge. LEFT and possible RIGHT laminectomies. Severe facet arthropathy. No canal stenosis. Mild neural foraminal narrowing. L4-5: LEFT and possibly RIGHT laminectomies. Severe facet arthropathy with RIGHT facet effusion contiguous with paraspinal fluid collection. RIGHT facet is widened to 3 mm. 5 mm central disc protrusion versus extrusion. Moderate to severe canal stenosis predominately due to fluid collection suspected though, this may be overestimated. Severe bilateral neural  foraminal narrowing. L5-S1: Anterolisthesis. Annular bulging. Severe facet arthropathy. Moderate canal stenosis. Moderate to severe bilateral neural foraminal narrowing. IMPRESSION: MR THORACIC SPINE IMPRESSION 1. No fracture, malalignment or acute osseous process. 2. Subcentimeter cord edema T7-8, less likely myelomalacia. 12 mm segment suspected myelomalacia T9-10. No syrinx. 3. Degenerative change of the lumbar spine resulting in moderate canal stenosis T9-10 and T10-11. Multilevel mild canal stenosis. 4. Multilevel neural foraminal narrowing: Severe at T2-3, T9-10 and T10-11. 5. Moderate to severe paraspinal muscle atrophy. MR LUMBAR SPINE IMPRESSION 1. Habitus and motion degraded examination. Similar grade 1 L2-3 retrolisthesis and grade 1 L4-5 anterolisthesis. No fracture. 2. L2-3 through L4-5 laminectomies. 2.9 x 4.3 x 6.1 cm fluid collection along surgical approach contiguous with RIGHT L4-5 facet effusion, septic arthropathy possible. Consider sampling. 3. Moderate to severe suspected canal stenosis L4-5 impart due to fluid collection, potentially overestimated. Moderate canal stenosis L5-S1. 4. Neural foraminal narrowing all lumbar levels: Severe at L4-5. 5. Severe paraspinal muscle atrophy, early LEFT psoas muscle atrophy. Electronically Signed   By: Awilda Metro M.D.   On: 06/28/2018 03:10   Mr Lumbar Spine Wo Contrast  Result Date: 06/28/2018 CLINICAL DATA:  Worsening low back pain radiating to bilateral lower extremity with numbness for several months. RIGHT leg weakness today, bilateral lower extremity paralysis for 2 days. Status post lumbar spine surgery February 2019. EXAM: MRI THORACIC AND LUMBAR SPINE WITHOUT CONTRAST TECHNIQUE: Multiplanar and multiecho pulse sequences of the thoracic and lumbar spine were obtained without intravenous contrast. COMPARISON:  MRI lumbar spine July 29, 2017 FINDINGS:  MRI THORACIC SPINE FINDINGS ALIGNMENT: Maintenance of the thoracic kyphosis. No  malalignment. VERTEBRAE/DISCS: Vertebral bodies are intact. Severe disc height loss T3-4 through T 910 with diffuse disc desiccation and moderate subacute discogenic endplate changes. No acute or abnormal bone marrow signal. Borderline congenital canal narrowing on the basis of foreshortened pedicles. CORD: Subcentimeter holo cord T2 bright signal at T7-8. 12 mm segment T2 bright signal spinal cord at T9-10, potential myelomalacia. No syrinx. PREVERTEBRAL AND PARASPINAL SOFT TISSUES: Symmetric moderate to severe paraspinal muscle atrophy. DISC LEVELS: T1-2: No disc bulge. Mild facet arthropathy. No canal stenosis. Mild neural foraminal narrowing. T2-3: Small broad-based disc bulge with superimposed 4 mm LEFT central disc protrusion. Mild facet arthropathy. Moderate canal stenosis. Severe RIGHT and moderate LEFT neural foraminal narrowing. T3-4, T4-5: No disc bulge, canal stenosis nor neural foraminal narrowing. T5-6: Bilateral subarticular disc protrusions measuring to 5 mm on the LEFT. Mild facet arthropathy. Mild canal stenosis. Moderate to severe RIGHT and moderate LEFT neural foraminal narrowing. T6-7: Small broad-based disc bulge minimal facet arthropathy. No canal stenosis. Moderate RIGHT neural foraminal narrowing. T7-8: 3 mm broad-based disc bulge. Mild facet arthropathy. Mild canal stenosis. Mild neural foraminal narrowing. T8-9: Small RIGHT subarticular disc protrusion. No canal stenosis. Moderate RIGHT and mild LEFT neural foraminal narrowing. T9-10: 5 mm broad-based disc bulge, moderate facet arthropathy. Moderate canal stenosis. Severe bilateral neural foraminal narrowing. T10-11: 3 mm broad-based disc bulge. Severe facet arthropathy. Moderate canal stenosis. Severe bilateral neural foraminal narrowing. T11-12: Annular bulging. Mild facet arthropathy. No canal stenosis or neural foraminal narrowing. T12-L1: No disc bulge. Moderate facet arthropathy without canal stenosis. Mild LEFT neural foraminal  narrowing. MRI LUMBAR SPINE FINDINGS-large body habitus results in decreased signal to noise ratio, mild motion degraded examination. SEGMENTATION: For the purposes of this report, the last well-formed intervertebral disc is reported as L5-S1. ALIGNMENT: Maintained lumbar lordosis. Similar minimal grade 1 L5-S1 anterolisthesis. Grade 1 L2-3 retrolisthesis. VERTEBRAE:Vertebral bodies are intact. Intervertebral discs demonstrate normal morphology, mild desiccation and superimposed mild disc edema lower lumbar discs. Moderate subacute discogenic endplate changes L5-S1. Multilevel mild subacute to chronic discogenic endplate changes. No suspicious bone marrow signal. CONUS MEDULLARIS AND CAUDA EQUINA: Conus medullaris terminates at L1-2 and demonstrates normal signal. Cauda equina not discretely identified. PARASPINAL AND OTHER SOFT TISSUES: Severe paraspinal muscle denervation/atrophy. Asymmetric LEFT distal iliopsoas muscle atrophy. 2.9 x 4.3 x 6.1 cm central paraspinal fluid collection L4 through S1 along surgical approach. DISC LEVELS: L1-2: Small broad-based disc bulge. Moderate RIGHT and moderate to severe LEFT facet arthropathy. No canal stenosis. Mild LEFT neural foraminal narrowing. L2-3: Retrolisthesis. Small broad-based disc bulge. Status post LEFT hemilaminectomy. Moderate facet arthropathy without canal stenosis. Moderate bilateral neural foraminal narrowing. L3-4: No disc bulge. LEFT and possible RIGHT laminectomies. Severe facet arthropathy. No canal stenosis. Mild neural foraminal narrowing. L4-5: LEFT and possibly RIGHT laminectomies. Severe facet arthropathy with RIGHT facet effusion contiguous with paraspinal fluid collection. RIGHT facet is widened to 3 mm. 5 mm central disc protrusion versus extrusion. Moderate to severe canal stenosis predominately due to fluid collection suspected though, this may be overestimated. Severe bilateral neural foraminal narrowing. L5-S1: Anterolisthesis. Annular  bulging. Severe facet arthropathy. Moderate canal stenosis. Moderate to severe bilateral neural foraminal narrowing. IMPRESSION: MR THORACIC SPINE IMPRESSION 1. No fracture, malalignment or acute osseous process. 2. Subcentimeter cord edema T7-8, less likely myelomalacia. 12 mm segment suspected myelomalacia T9-10. No syrinx. 3. Degenerative change of the lumbar spine resulting in moderate canal stenosis T9-10 and T10-11. Multilevel mild canal stenosis.  4. Multilevel neural foraminal narrowing: Severe at T2-3, T9-10 and T10-11. 5. Moderate to severe paraspinal muscle atrophy. MR LUMBAR SPINE IMPRESSION 1. Habitus and motion degraded examination. Similar grade 1 L2-3 retrolisthesis and grade 1 L4-5 anterolisthesis. No fracture. 2. L2-3 through L4-5 laminectomies. 2.9 x 4.3 x 6.1 cm fluid collection along surgical approach contiguous with RIGHT L4-5 facet effusion, septic arthropathy possible. Consider sampling. 3. Moderate to severe suspected canal stenosis L4-5 impart due to fluid collection, potentially overestimated. Moderate canal stenosis L5-S1. 4. Neural foraminal narrowing all lumbar levels: Severe at L4-5. 5. Severe paraspinal muscle atrophy, early LEFT psoas muscle atrophy. Electronically Signed   By: Awilda Metro M.D.   On: 06/28/2018 03:10   Ct Wrist Left W Contrast  Result Date: 07/08/2018 CLINICAL DATA:  Wrist pain and swelling since falling a couple weeks ago. Staph bacteremia. Septic arthritis suspected. EXAM: CT OF THE UPPER LEFT EXTREMITY WITH CONTRAST TECHNIQUE: Multidetector CT imaging of the left wrist and distal forearm was performed according to the standard protocol following intravenous contrast administration. Patient was unable to raise his arm over his head. The study was performed with the arm at his side. This results in significant degradation of image quality. COMPARISON:  Radiographs 06/29/2018. CONTRAST:  75mL OMNIPAQUE IOHEXOL 300 MG/ML  SOLN FINDINGS: Bones/Joint/Cartilage  No acute fracture, dislocation or bone destruction identified. No large joint effusions identified. Ligaments Suboptimally assessed by CT. Muscles and Tendons No focal muscular abnormality or significant tenosynovitis identified. Soft tissues No evidence of foreign body or soft tissue emphysema. Probable mild generalized soft tissue edema without apparent focal fluid collection. IMPRESSION: 1. No evidence of septic joint or osteomyelitis. 2. Nonspecific soft tissue edema without focal fluid collection. 3. This study is significantly limited, with image quality degraded by body habitus and positioning. Accordingly, sensitivity is reduced. Electronically Signed   By: Carey Bullocks M.D.   On: 07/08/2018 13:36   US Renal  Result Date: 06/30/2018 CLINICAL DATA:  Acute tubular necrosis, hypertension, obesity, chronic kidney disease EXAM: RENAL / URINARY TRACT ULTRASOUND COMPLETE COMPARISON:  CT abdomen pelvis of 03/21/2016 FINDINGS: Right Kidney: Length: 13.6 cm. No hydronephrosis is seen. The renal parenchyma appears normal. There is a nonobstructing calculus in the lower right kidney measuring 10 x 6 mm. Left Kidney: Length: 14.2 cm. No hydronephrosis is seen. There are small hypoechoic structures in the mid and lower lateral left kidney consistent with cysts of 2.7 and 1.3 cm. Bladder: The urinary bladder is decompressed with Foley catheter present. IMPRESSION: 1. No hydronephrosis. 2. Nonobstructing lower pole right renal calculus of 10 x 6 mm. 3. Small hypoechoic structures consistent with cysts on the left. No left renal calculi are seen. Electronically Signed   By: Dwyane Dee M.D.   On: 06/30/2018 10:47   Mr Shoulder Left Wo Contrast  Result Date: 06/30/2018 CLINICAL DATA:  [name] [name] 1 week ago.  Left shoulder pain. EXAM: MRI OF THE LEFT SHOULDER WITHOUT CONTRAST TECHNIQUE: Multiplanar, multisequence MR imaging of the shoulder was performed. No intravenous contrast was administered. COMPARISON:  Left shoulder  radiographs 06/28/2018 FINDINGS: Examination is quite limited due to the patient's size and motion. Rotator cuff: There is a large, full-thickness retracted supraspinatus tendon tear. Maximum retraction is 28 mm and the tear is approximately 22 mm wide. Moderate infraspinatus tendinopathy and mild subscapularis tendinopathy. Muscles: Diffuse fatty infiltrative changes involving all of the shoulder muscles. There is also moderate edema like signal change involving the rotator cuff muscles and the posterior deltoid musculature. Possible  muscle strains or tears. Biceps long head:  Intact Acromioclavicular Joint: Moderate to advanced degenerative changes with inferior spurring. Type 2 acromion. No lateral downsloping or undersurface spurring. Glenohumeral Joint: Moderate degenerative changes with moderate cartilage loss, joint space narrowing and early spurring changes. There is also mild subchondral cystic change involving the glenoid. There is a small joint effusion. No significant synovitis. Labrum:  No obvious labral tears but exam is quite limited for that. Bones:  No acute bony findings.  No fracture or bone lesions. Other: Expected fluid in the subacromial/subdeltoid bursa. IMPRESSION: 1. Full-thickness retracted supraspinatus tendon tear measuring 28 x 22 mm. The infraspinatus and subscapularis tendons are intact. 2. Diffuse fatty infiltrative changes involving all the shoulder muscles. There is also moderate edema in the rotator cuff muscles and also in the posterior deltoid muscle which could be muscle strains partial tears. 3. Intact long head biceps tendon and grossly normal glenoid labrum. 4. Moderate glenohumeral joint degenerative changes and moderate to advanced AC joint degenerative changes. Electronically Signed   By: Rudie Meyer M.D.   On: 06/30/2018 15:48   Ct Aspiration  Result Date: 06/28/2018 INDICATION: History of spinal surgery in February 2019, now with bilateral lower extremity paralysis  for the past 2 days. Lumbar spine MRI demonstrated an indeterminate fluid collection posterior to the L4-L5 vertebral bodies. Request made for image guided fluid aspiration for Gram stain and culture analysis. EXAM: CT GUIDANCE NEEDLE PLACEMENT COMPARISON:  Lumbar spine MRI - 06/28/2018 MEDICATIONS: The patient is currently admitted to the hospital and receiving intravenous antibiotics. The antibiotics were administered within an appropriate time frame prior to the initiation of the procedure. ANESTHESIA/SEDATION: Moderate (conscious) sedation was employed during this procedure. A total of Fentanyl 150 mcg was administered intravenously. Moderate Sedation Time: 15 minutes. The patient's level of consciousness and vital signs were monitored continuously by radiology nursing throughout the procedure under my direct supervision. CONTRAST:  None COMPLICATIONS: None immediate. PROCEDURE: Informed written consent was obtained from the patient after a discussion of the risks, benefits and alternatives to treatment. The patient was placed prone on the CT gantry and a pre procedural CT was performed re-demonstrating the known abscess/fluid collection within the soft tissues posterior to the L4-L5 vertebral body with dominant air in fluid containing collection measuring approximately 5.9 x 2.7 cm (image 37, series 2). The procedure was planned. A timeout was performed prior to the initiation of the procedure. The skin overlying the midline of the lower back was prepped and draped in the usual sterile fashion. The overlying soft tissues were anesthetized with 1% lidocaine with epinephrine. Appropriate trajectory was planned with the use of a 22 gauge spinal needle. An 18 gauge trocar needle was advanced into the abscess/fluid collection. Appropriate positioning was confirmed with a limited CT scan. Approximately 7 cc of bloody fluid was aspirated as the needle was slowly retracted. All aspirated fluid was capped and sent to  the laboratory for analysis. A dressing was placed. The patient tolerated the procedure well without immediate post procedural complication. IMPRESSION: Successful CT guided aspiration of approximately 7 cc of bloody fluid from dominant fluid collection posterior to the L4 and L5 vertebral bodies. Samples were sent to the laboratory as requested by the ordering clinical team. Electronically Signed   By: Simonne Come M.D.   On: 06/28/2018 18:12   Dg Shoulder Left  Result Date: 06/28/2018 CLINICAL DATA:  Fall yesterday with left shoulder pain. EXAM: LEFT SHOULDER - 2+ VIEW COMPARISON:  None. FINDINGS: Mild  degenerate change of the [facility] joint and glenohumeral joints. No acute fracture or dislocation. IMPRESSION: No acute findings. Electronically Signed   By: Elberta Fortis M.D.   On: 06/28/2018 17:34   Dg Knee Complete 4 Views Left  Result Date: 06/28/2018 CLINICAL DATA:  Fall yesterday with left knee pain. EXAM: LEFT KNEE - COMPLETE 4+ VIEW COMPARISON:  None. FINDINGS: Moderate degenerative change of the lateral compartment with moderate to severe degenerative changes of the medial compartment and patellofemoral joints. No acute fracture or dislocation. Joint effusion is present. IMPRESSION: No acute fracture. Moderate to severe osteoarthritic change.  Joint effusion. Electronically Signed   By: Elberta Fortis M.D.   On: 06/28/2018 17:33   Dg Foot Complete Left  Result Date: 06/30/2018 CLINICAL DATA:  Left great toe ulcer. EXAM: LEFT FOOT - COMPLETE 3+ VIEW COMPARISON:  No recent. FINDINGS: No acute soft tissue bony abnormality identified. Degenerative changes noted about the left foot, most prominent about the first MTP joint. No radiopaque foreign body. IMPRESSION: No acute abnormality identified. Specifically no erosive abnormalities are identified. Diffuse degenerative change, most prominent the first MTP joint. Electronically Signed   By: Maisie Fus  Register   On: 06/30/2018 11:15   [place] Ekg Site  Rite  Result Date: 07/08/2018 If Site Rite image not attached, placement could not be confirmed due to current cardiac rhythm.      Contact information for follow-up providers    Kerrin Champagne, MD. Schedule an appointment as soon as possible for a visit in 1 week(s).   Specialty:  Orthopedic Surgery Why:  For wound re-check Contact information: [address] [place] [address] [phone]        [facility] Follow up in 4 week(s).   Specialty:  Infectious Diseases Why:  Follow up with Marcos Eke, NP or Dr. Renetta Chalk information: [address] [place], [address] [phone] mc [address] [place] [address] [phone]       Milagros Reap, MD .   Specialty:  Cardiology Contact information: [address] [place] [address] [phone]        [facility]              Follow up in 4 week(s).   Contact information: [address] [place] [address]       Julio Sicks, MD Follow up.   Specialty:  Neurosurgery Why:  cervical and thoracic myelopathy Contact information: [address] [place] [address] [phone]            Contact information for after-discharge care    Destination    [facility] .   Service:  Skilled Nursing Contact information: [address] [place] [address] [phone]                  Discharge Plan:  discharge to home  Disposition:     Signed: Zonia Kief  07/23/2018, 4:09 PM

## 2018-07-23 NOTE — Progress Notes (Signed)
Wound vac working at this time. Will continue to monitor.

## 2018-07-23 NOTE — Progress Notes (Signed)
Pt moved with maxiski lift, wound vac disconnected during transfer.  WOC contacted, ordered connector. If unable to reconnect will do wet to dry. Will continue to monitor.

## 2018-07-23 NOTE — Progress Notes (Signed)
Physical Therapy Session Note  Patient Details  Name: [name] [name] MRN: [id number] Date of Birth: Sep 29, 1956  Today's Date: 07/23/2018 PT Individual Time: 1505-1530 PT Individual Time Calculation (min): 25 min   Short Term Goals: Week 2:  PT Short Term Goal 1 (Week 2): Pt will complete rolling L/R with bed features and min A PT Short Term Goal 2 (Week 2): Pt will transfer sit to stand with assist x 1  PT Short Term Goal 3 (Week 2): Pt will complete least restrictive transfer bed to/from w/c with assist x 1 PT Short Term Goal 4 (Week 2): Pt will propel manual w/c x 30 ft for strengthening and cardiovascular endurance  Skilled Therapeutic Interventions/Progress Updates:    Patient received in bed, PT offered getting up to chair but patient politely declines due to high pain levels (8/10 in back, premedicated) and requests focus on bed level activity instead. Focused on functional LE exercise in the bed with ModA and VC for correct form and breathing with exercise. Exercises including ankle DF against light orange band, quad sets, hamstring slides, supine hip ABD, SLRs, isometric hip extension, and resisted LE press in supine. He was fatigued at EOS and was left in bed with all needs met, family present this afternoon.   Therapy Documentation Precautions:  Precautions Precautions: Fall Precaution Booklet Issued: No Precaution Comments: wound VAC; Restrictions Weight Bearing Restrictions: No General:   Pain: Pain Assessment Pain Scale: 0-10 Pain Score: 8  Pain Type: Acute pain Pain Location: Back Pain Orientation: Lower;Medial Pain Descriptors / Indicators: Aching;Discomfort;Dull Pain Onset: On-going Patients Stated Pain Goal: 0 Pain Intervention(s): Medication (See eMAR)    Therapy/Group: Individual Therapy  Deniece Ree PT, DPT, CBIS  Supplemental Physical Therapist [facility]    Pager [phone] Acute Rehab Office [phone]   07/23/2018, 3:38 PM

## 2018-07-24 ENCOUNTER — Inpatient Hospital Stay (HOSPITAL_COMMUNITY): Payer: No Typology Code available for payment source | Admitting: Physical Therapy

## 2018-07-24 ENCOUNTER — Inpatient Hospital Stay (HOSPITAL_COMMUNITY): Payer: No Typology Code available for payment source | Admitting: Occupational Therapy

## 2018-07-24 LAB — CBC
HCT: 25.4 % — ABNORMAL LOW (ref 39.0–52.0)
Hemoglobin: 7.6 g/dL — ABNORMAL LOW (ref 13.0–17.0)
MCH: 26.6 pg (ref 26.0–34.0)
MCHC: 29.9 g/dL — ABNORMAL LOW (ref 30.0–36.0)
MCV: 88.8 fL (ref 80.0–100.0)
NRBC: 0 % (ref 0.0–0.2)
PLATELETS: 457 10*3/uL — AB (ref 150–400)
RBC: 2.86 MIL/uL — ABNORMAL LOW (ref 4.22–5.81)
RDW: 15.6 % — AB (ref 11.5–15.5)
WBC: 8 10*3/uL (ref 4.0–10.5)

## 2018-07-24 NOTE — Plan of Care (Signed)
  Problem: RH SKIN INTEGRITY Goal: RH STG SKIN FREE OF INFECTION/BREAKDOWN Description No new breakdown with min assist   Outcome: Progressing Goal: RH STG ABLE TO PERFORM INCISION/WOUND CARE W/ASSISTANCE Description STG Able To Perform Incision/Wound Care With Max Assistance.  Outcome: Progressing  Re-enforced dressing on wound r/t leaking  Problem: RH PAIN MANAGEMENT Goal: RH STG PAIN MANAGED AT OR BELOW PT'S PAIN GOAL Description <3 out of 10.   Outcome: Progressing  Administered pain regimen as needed.

## 2018-07-24 NOTE — Progress Notes (Signed)
Educated pt and spouse on clean technique in and out cath. Spouse successfully mastered task, along with verbal directions. Staff will continue to monitor and meet needs.

## 2018-07-24 NOTE — Progress Notes (Signed)
Physical Therapy Weekly Progress Note  Patient Details  Name: [name] [name] MRN: [id number] Date of Birth: [birth year]  Beginning of progress report period: July 18, 2018 End of progress report period: July 24, 2018  Today's Date: 07/24/2018 PT Individual Time: 1030-1110 PT Individual Time Calculation (min): 40 min   Pt in w/c agreeable to therapy, no c/o pain at rest. Pt performs beginning of sliding board transfer with max A, then incontinent of bowel.  Pt transferred back to bed with maxi sky.  Max A with rolling and total A for clothing management and hygiene.  Pt left in bed with nursing present.  Patient has met 0 of 3 short term goals.  Pt making slow progress towards PT goals, limited by pain, UE/LE weakness and body habitus. Pt progressing sliding board transfers and w/c mobility slowly.  Patient continues to demonstrate the following deficits muscle weakness, abnormal tone and decreased sitting balance, decreased standing balance, decreased postural control and decreased balance strategies and therefore will continue to benefit from skilled PT intervention to increase functional independence with mobility.  Patient progressing toward long term goals..  Continue plan of care.  PT Short Term Goals Week 2:  PT Short Term Goal 1 (Week 2): Pt will complete rolling L/R with bed features and min A PT Short Term Goal 1 - Progress (Week 2): Progressing toward goal PT Short Term Goal 2 (Week 2): Pt will transfer sit to stand with assist x 1  PT Short Term Goal 2 - Progress (Week 2): Progressing toward goal PT Short Term Goal 3 (Week 2): Pt will complete least restrictive transfer bed to/from w/c with assist x 1 PT Short Term Goal 3 - Progress (Week 2): Progressing toward goal PT Short Term Goal 4 (Week 2): Pt will propel manual w/c x 30 ft for strengthening and cardiovascular endurance Week 3:  PT Short Term Goal 1 (Week 3): pt will perform functional transfers with max A of 1 PT  Short Term Goal 2 (Week 3): pt will perform rolling in bed with min A  Skilled Therapeutic Interventions/Progress Updates:  Ambulation/gait training;Balance/vestibular training;Community reintegration;Discharge planning;DME/adaptive equipment instruction;Functional electrical stimulation;Functional mobility training;Neuromuscular re-education;Patient/family education;Skin care/wound management;Stair training;Therapeutic Activities;Therapeutic Exercise;UE/LE Strength taining/ROM;UE/LE Coordination activities;Wheelchair propulsion/positioning;Pain management;Splinting/orthotics   Therapy Documentation Precautions:  Precautions Precautions: Fall Precaution Booklet Issued: No Precaution Comments: wound VAC; Restrictions Weight Bearing Restrictions: No Pain:  no c/o pain during session.  Therapy/Group: Individual Therapy  Leiliana Foody 07/24/2018, 11:14 AM

## 2018-07-24 NOTE — Progress Notes (Signed)
Physical Therapy Note  Patient Details  Name: [name] [name] MRN: [id number] Date of Birth: 07/28/1956 Today's Date: 07/24/2018    Arrived for scheduled therapy session; pt reporting increased pain in back and declining therapy at this time.  Reports already medicated and with heat to sore area, and declines any further intervention.  Left positioned to comfort with call bell in reach and needs met.    [name] 07/24/2018, 4:11 PM

## 2018-07-24 NOTE — Progress Notes (Signed)
[place] PHYSICAL MEDICINE & REHABILITATION PROGRESS NOTE   Subjective/Complaints: Had a better day re: pain yesterday. Back is still limiting although left shoulder is moving better. Also encouraged about increased strength in right leg.  ROS: Patient denies fever, rash, sore throat, blurred vision, nausea, vomiting, diarrhea, cough, shortness of breath or chest pain,   headache, or mood change.   Objective:   No results found. Recent Labs    07/22/18 0453 07/24/18 0317  WBC 8.2 8.0  HGB 7.3* 7.6*  HCT 24.7* 25.4*  PLT 430* 457*   No results for input(s): NA, K, CL, CO2, GLUCOSE, BUN, CREATININE, CALCIUM in the last 72 hours.  Intake/Output Summary (Last 24 hours) at 07/24/2018 0941 Last data filed at 07/24/2018 0548 Gross per 24 hour  Intake 480 ml  Output 2450 ml  Net -1970 ml     Physical Exam: Vital Signs Blood pressure 129/79, pulse 92, temperature 98.1 F (36.7 C), resp. rate 18, height 5\' 10"  (1.778 m), weight (!) 189.6 kg, SpO2 99 %.  Constitutional: No distress . Vital signs reviewed. obese HEENT: EOMI, oral membranes moist Neck: supple Cardiovascular: RRR without murmur. No JVD    Respiratory: CTA Bilaterally without wheezes or rales. Normal effort    GI: BS +, non-tender, non-distended  Musc: Lower extremity edema. LB remains tender. Left shoulder with improved PROM/AROM Neurological: He isalertand oriented  Motor:  5/5 in the right deltoid, bicep, tricep, grip 2 to 2+/5 right HE, KE, ADF  2-/5 LLE Skin:Intact.  Warm and dry. Back wound with vac Psych:pleasant and cooperative.   Assessment/Plan: 1. Functional deficits secondary to thoracic SCI, septic arthritis which require 3+ hours per day of interdisciplinary therapy in a comprehensive inpatient rehab setting.  Physiatrist is providing close team supervision and 24 hour management of active medical problems listed below.  Physiatrist and rehab team continue to assess barriers to  discharge/monitor patient progress toward functional and medical goals  Care Tool:  Bathing    Body parts bathed by patient: Left arm, Chest, Abdomen, Face   Body parts bathed by helper: Front perineal area, Buttocks, Right arm, Right upper leg, Left upper leg, Right lower leg, Left lower leg     Bathing assist Assist Level: Maximal Assistance - Patient 24 - 49%     Upper Body Dressing/Undressing Upper body dressing Upper body dressing/undressing activity did not occur (including orthotics): Safety/medical concerns What is the patient wearing?: Pull over shirt    Upper body assist Assist Level: Set up assist    Lower Body Dressing/Undressing Lower body dressing      What is the patient wearing?: Pants     Lower body assist Assist for lower body dressing: Moderate Assistance - Patient 50 - 74%     Toileting Toileting Toileting Activity did not occur Press photographer and hygiene only): N/A (no void or bm)  Toileting assist Assist for toileting: 2 Helpers     Transfers Chair/bed transfer  Transfers assist     Chair/bed transfer assist level: Dependent - mechanical lift     Locomotion Ambulation   Ambulation assist   Ambulation activity did not occur: Safety/medical concerns          Walk 10 feet activity   Assist           Walk 50 feet activity   Assist           Walk 150 feet activity   Assist  Walk 10 feet on uneven surface  activity   Assist           Wheelchair     Assist Will patient use wheelchair at discharge?: Yes Type of Wheelchair: Manual Wheelchair activity did not occur: Safety/medical concerns  Wheelchair assist level: Supervision/Verbal cueing Max wheelchair distance: 30    Wheelchair 50 feet with 2 turns activity    Assist    Wheelchair 50 feet with 2 turns activity did not occur: Safety/medical concerns       Wheelchair 150 feet activity     Assist Wheelchair 150 feet  activity did not occur: Safety/medical concerns        Medical Problem List and Plan: 1.Decreased functional mobilitysecondary to left C5 radiculopathy,  thoracic cord contusion with myelopathy/chronic radiculopathy-status post lumbar wound debridement drainage irrigation 9/17/2019with wound VAC placement, septic arthritis left knee MSSA bacteremia             -Continue CIR therapies including PT, OT    2. DVT Prophylaxis/Anticoagulation: Subcutaneous Lovenox.             -dopplers negative 3. Pain Management:Robaxin and hydrocodone as needed  -  hydrocodone   7.5mg  q4 prn               -ACE wrap left knee for comfort as needed 4. Mood:Cymbalta 60 mg daily. Provide emotional support 5. Neuropsych: This patientiscapable of making decisions on hisown behalf. 6. Skin/Wound Care: wound closing             -WOC RN assisting with vac---appreciate help!  -1-2 more weeks of vac? 7. Fluids/Electrolytes/Nutrition: push po  -re-check labs next monday            8.ID/MSSA bacteremia. IV Ancef 2 g every 8 hours through 08/11/2018 and stoped. Follow-up infectious disease.  9.Septic arthritis left knee. Status post arthroscopy irrigation and drainage 10.Left shoulder full-thickness retracted supraspinatus tendon tear. Conservative care             - improvement in left shoulder pain s/p steroid injection 10/3   11.Acute on chronic anemia. Normocytic  -Continue ferrous gluconate  -I personally reviewed the patient's labs today.               Hemoglobin 8.0 on 10/5--> 7.9 --> 7.3 10/9--> 7.6 10/11             -stool OB negative x2             -continue serial CBC's---recheck Monday  -  Consider H/O consult if further drop  12.BPH/urinary retention.  Flomax 0.4 mg daily, I/O cath prn 13.Constipation. stool "mushy" per staff--held senokot-s             -likely antibiotic effect             -Continue probiotic, stools decreased 13.  HTN             noticeable  improvement  -initiated norvasc 5mg  daily 10/7 14.  Hypokalemia             Potassium 3.9 10/7  -recheck monday                    LOS: 15 days A FACE TO FACE EVALUATION WAS PERFORMED  Ranelle Oyster 07/24/2018, 9:41 AM

## 2018-07-24 NOTE — Consult Note (Signed)
  WOC Nurse wound consult note Patient seen at [facility] [phone]. No family present.  Reason for Consult: NPWT dressing change. 1 piece of black foam removed from wound bed. 1 piece placed, immediate seal obtained.  Wound type: Surgical Wound bed: 100% pink granulation tissue.  Drainage (amount, consistency, odor) Serosanginous drainage in canister. Periwound: Normal color and texture. A small fluid filled bulla was beneath the right flank edge of the dressing. It burst when drape was removed. Protective cream was applied.  Dressing procedure/placement/frequency: MWF vac dressing by WOC team. Monitor the wound area(s) for worsening of condition such as: Signs/symptoms of infection,  Increase in size,  Development of or worsening of odor, Development of pain, or increased pain at the affected locations.  Notify the medical team if any of these develop.  Thank you for the consult.  Discussed plan of care with the patient and bedside nurse.  WOC nurse will not follow at this time.  Please re-consult the WOC team if needed.  Helmut Muster, RN, MSN, CWOCN, CNS-BC, pager [phone]

## 2018-07-24 NOTE — Progress Notes (Signed)
Occupational Therapy Session Note  Patient Details  Name: [name] [name] MRN: [id number] Date of Birth: 1956/06/12  Today's Date: 07/24/2018 OT Individual Time: 1333-1430 OT Individual Time Calculation (min): 57 min   Short Term Goals: Week 2:  OT Short Term Goal 1 (Week 2): Pt will transfer to w/c with Max A of 2 in prep for [facility] transfer  OT Short Term Goal 2 (Week 2): Pt will complete 1/3 components of donning pants with AE as needed OT Short Term Goal 3 (Week 2): Pt will don footwear with Mod A using AE    Skilled Therapeutic Interventions/Progress Updates:    Pt greeted in bed with spouse present. Requesting to use tilt table. Donned his sneakers in bed, including Lt AFO which pt reports gives him greater support. Total A for this. 2 helpers for safe rolling Lt>Rt to place Maxi lift pad, and pt transferred to tilt table in room. He completed 10-20 mini squats, multiple sets in 20 degrees with cues for slow, controlled movements. When in 40 degrees, pt completed AROM R UE and AAROM L UE in shoulder flexion and abduction x10 reps, 3 sets. OT assisted at Lt elbow for increasing range. Also taught pt self ROM techniques for L UE. Several reclined rest breaks required due to fatigue. Manual assist required to prevent external rotation of LEs when in supported standing. Also guided pt through gentle cervical and shoulder stretches while supine to decrease UB tension. At end of session, pt opted to return to bed. 2 helpers for Maxi transfer back to bed. Pt was left with all needs within reach and spouse present.   Therapy Documentation Precautions:  Precautions Precautions: Fall Precaution Booklet Issued: No Precaution Comments: wound VAC; Restrictions Weight Bearing Restrictions: No Vital Signs: Therapy Vitals Temp: 98.2 F (36.8 C) Temp Source: Oral Pulse Rate: 94 Resp: 16 BP: 129/73 Patient Position (if appropriate): Lying Oxygen Therapy SpO2: 100 % O2 Device: Room Air Pain: In  lower right side of back. Applied MHP to this area during exercises, which pt reported provided increased relief. Also notified RN at end of session of pts request for pain medicine.    ADL:        Therapy/Group: Individual Therapy  Satya Bohall A Jimy Gates 07/24/2018, 3:58 PM

## 2018-07-24 NOTE — Progress Notes (Signed)
Occupational Therapy Session Note  Patient Details  Name: [name] [name] MRN: [id number] Date of Birth: 09-12-1956  Today's Date: 07/24/2018 OT Individual Time: 6759-1638 OT Individual Time Calculation (min): 35 min    Short Term Goals: Week 1:  OT Short Term Goal 1 (Week 1): Pt will transfer to w/c MAX A of 2 to w/c in prep for BSC transfer OT Short Term Goal 1 - Progress (Week 1): Progressing toward goal OT Short Term Goal 2 (Week 1): Pt will don shirt MOD A OT Short Term Goal 2 - Progress (Week 1): Met OT Short Term Goal 3 (Week 1): Pt will recall hemi dressing techniques for UB dressing with min question cues OT Short Term Goal 3 - Progress (Week 1): Met OT Short Term Goal 4 (Week 1): Pt will groom at sink wiht supervision and AE PRN OT Short Term Goal 4 - Progress (Week 1): Met Week 2:  OT Short Term Goal 1 (Week 2): Pt will transfer to w/c with Max A of 2 in prep for [facility] transfer  OT Short Term Goal 2 (Week 2): Pt will complete 1/3 components of donning pants with AE as needed OT Short Term Goal 3 (Week 2): Pt will don footwear with Mod A using AE  Skilled Therapeutic Interventions/Progress Updates:    1:1 Focus on come to the EOB (window side due to mattress not being centered on the bed). Pt able to come to EOB with max A of one caregiver. Pt able to maintain sitting balance on EOB with occasional LOB requiring mod A to correct. Donned shirt with min A; requiring A for putting shirt over head. Used the reacher to help thread his pants with min A. Total A for donning shoes and left AFO (had before admission). Lateral leans with min A for coming back into sitting position for OT to assist with pulling them back up. Pt performed two sit to stands with the SARA Plus with the walking sling for more support. Pt able to maintain standing position without pain with knee support. Pt able to unweight left LE to step back but unable to do the same with right LE.  With foot plate back in place- pt  transferred in standing with the SARA into the w/c.  Elevated Le rests in place and left with nursing.   Therapy Documentation Precautions:  Precautions Precautions: Fall Precaution Booklet Issued: No Precaution Comments: wound VAC; Restrictions Weight Bearing Restrictions: No Pain:  reports no back pain in treatment session today.   Therapy/Group: Individual Therapy  [name] [facility] 07/24/2018, 9:49 AM

## 2018-07-25 ENCOUNTER — Inpatient Hospital Stay (HOSPITAL_COMMUNITY): Payer: No Typology Code available for payment source | Admitting: Physical Therapy

## 2018-07-25 MED ORDER — SODIUM CHLORIDE 0.9 % IV SOLN
INTRAVENOUS | Status: DC | PRN
  Administered 2018-07-25: 17:00:00 via INTRAVENOUS
  Administered 2018-07-27 – 2018-07-31 (×3): 250 mL via INTRAVENOUS

## 2018-07-25 NOTE — Progress Notes (Signed)
Physical Therapy Session Note  Patient Details  Name: Facundo Allemand MRN: [id number] Date of Birth: 12-29-1955  Today's Date: 07/25/2018 PT Individual Time: 5956-3875 PT Individual Time Calculation (min): 43 min   Short Term Goals: Week 3:  PT Short Term Goal 1 (Week 3): pt will perform functional transfers with max A of 1 PT Short Term Goal 2 (Week 3): pt will perform rolling in bed with min A  Skilled Therapeutic Interventions/Progress Updates:   Pt in supine and agreeable to therapy, denies pain and requesting to don clothes. Worked on rolling while putting on LE garments. Verbal and tactile cues for technique. Mod assist to roll in both directions multiple times, total assist to don pants and shoes. Transferred to w/c via [facility] for time management. Supervision to don shirt once in w/c. Pt self-propelled w/c to therapy gym to work on endurance training and overall strengthening. Performed UE strengthening exercises w/ 4# weighted dowel rod including shoulder press and shoulder flexion, 3x10 reps each. Pt able to reposition himself in w/c w/o assist utilizing UE push up on armrests. Returned to room and ended session in w/c, all needs in reach.   Therapy Documentation Precautions:  Precautions Precautions: Fall Precaution Booklet Issued: No Precaution Comments: wound VAC; Restrictions Weight Bearing Restrictions: No Pain: Pain Assessment Pain Scale: 0-10 Pain Score: 6  Pain Type: Acute pain Pain Location: Back Pain Orientation: Mid;Lower Pain Descriptors / Indicators: Aching;Discomfort Pain Frequency: Intermittent Pain Onset: Gradual Patients Stated Pain Goal: 2 Pain Intervention(s): Medication (See eMAR)  Therapy/Group: Individual Therapy  Joyanna Kleman Melton Krebs 07/25/2018, 3:14 PM

## 2018-07-25 NOTE — Progress Notes (Signed)
[place] PHYSICAL MEDICINE & REHABILITATION PROGRESS NOTE   Subjective/Complaints: Patient in good spirits.  Denies any significant pains at the current time.  No new numbness weakness or shooting pains in the arms or legs  ROS: Patient denies fever, rash, sore throat, blurred vision, nausea, vomiting, diarrhea, cough, shortness of breath or chest pain,   headache, or mood change.   Objective:   No results found. Recent Labs    07/24/18 0317  WBC 8.0  HGB 7.6*  HCT 25.4*  PLT 457*   No results for input(s): NA, K, CL, CO2, GLUCOSE, BUN, CREATININE, CALCIUM in the last 72 hours.  Intake/Output Summary (Last 24 hours) at 07/25/2018 1311 Last data filed at 07/25/2018 0800 Gross per 24 hour  Intake 300 ml  Output 1400 ml  Net -1100 ml     Physical Exam: Vital Signs Blood pressure (!) 144/89, pulse 80, temperature 99 F (37.2 C), temperature source Oral, resp. rate 18, height 5\' 10"  (1.778 m), weight (!) 189.6 kg, SpO2 98 %.  Constitutional: No distress . Vital signs reviewed. obese HEENT: EOMI, oral membranes moist Neck: supple Cardiovascular: RRR without murmur. No JVD    Respiratory: CTA Bilaterally without wheezes or rales. Normal effort    GI: BS +, non-tender, non-distended  Musc: Lower extremity edema. LB remains tender. Left shoulder with improved PROM/AROM Neurological: He isalertand oriented  Motor:  5/5 in the right deltoid, bicep, tricep, grip 2 to 2+/5 right HE, KE, ADF unchanged 2-/5 LLE unchanged Skin:Intact.  Warm and dry. Back wound with vac Psych:pleasant and cooperative.   Assessment/Plan: 1. Functional deficits secondary to thoracic SCI, septic arthritis which require 3+ hours per day of interdisciplinary therapy in a comprehensive inpatient rehab setting.  Physiatrist is providing close team supervision and 24 hour management of active medical problems listed below.  Physiatrist and rehab team continue to assess barriers to discharge/monitor  patient progress toward functional and medical goals  Care Tool:  Bathing    Body parts bathed by patient: Left arm, Chest, Abdomen, Face   Body parts bathed by helper: Front perineal area, Buttocks, Right arm, Right upper leg, Left upper leg, Right lower leg, Left lower leg     Bathing assist Assist Level: Maximal Assistance - Patient 24 - 49%     Upper Body Dressing/Undressing Upper body dressing Upper body dressing/undressing activity did not occur (including orthotics): Safety/medical concerns What is the patient wearing?: Pull over shirt    Upper body assist Assist Level: Minimal Assistance - Patient > 75%    Lower Body Dressing/Undressing Lower body dressing      What is the patient wearing?: Pants     Lower body assist Assist for lower body dressing: Moderate Assistance - Patient 50 - 74%     Toileting Toileting Toileting Activity did not occur Press photographer and hygiene only): N/A (no void or bm)  Toileting assist Assist for toileting: 2 Helpers     Transfers Chair/bed transfer  Transfers assist     Chair/bed transfer assist level: Dependent - mechanical lift     Locomotion Ambulation   Ambulation assist   Ambulation activity did not occur: Safety/medical concerns          Walk 10 feet activity   Assist           Walk 50 feet activity   Assist           Walk 150 feet activity   Assist  Walk 10 feet on uneven surface  activity   Assist           Wheelchair     Assist Will patient use wheelchair at discharge?: Yes Type of Wheelchair: Manual Wheelchair activity did not occur: Safety/medical concerns  Wheelchair assist level: Supervision/Verbal cueing Max wheelchair distance: 30    Wheelchair 50 feet with 2 turns activity    Assist    Wheelchair 50 feet with 2 turns activity did not occur: Safety/medical concerns       Wheelchair 150 feet activity     Assist Wheelchair 150  feet activity did not occur: Safety/medical concerns        Medical Problem List and Plan: 1.Decreased functional mobilitysecondary to left C5 radiculopathy,  thoracic cord contusion with myelopathy/chronic radiculopathy-status post lumbar wound debridement drainage irrigation 9/17/2019with wound VAC placement, septic arthritis left knee MSSA bacteremia             -Continue CIR therapies including PT, OT    2. DVT Prophylaxis/Anticoagulation: Subcutaneous Lovenox.             -dopplers negative 3. Pain Management:Robaxin and hydrocodone as needed  -  hydrocodone   7.5mg  q4 prn               -ACE wrap left knee for comfort as needed 4. Mood:Cymbalta 60 mg daily. Provide emotional support 5. Neuropsych: This patientiscapable of making decisions on hisown behalf. 6. Skin/Wound Care: wound closing             -WOC RN assisting with vac--  -1-2 more weeks of vac? 7. Fluids/Electrolytes/Nutrition: push po  -re-check labs next monday            8.ID/MSSA bacteremia. IV Ancef 2 g every 8 hours through 08/11/2018 and stoped. Follow-up infectious disease.  9.Septic arthritis left knee. Status post arthroscopy irrigation and drainage, no swelling or pain with range of motion 10.Left shoulder full-thickness retracted supraspinatus tendon tear. Conservative care             - improvement in left shoulder pain s/p steroid injection 10/3   11.Acute on chronic anemia. Normocytic  -Continue ferrous gluconate  -I personally reviewed the patient's labs today.               Hemoglobin 8.0 on 10/5--> 7.9 --> 7.3 10/9--> 7.6 10/11             -stool OB negative x2             -continue serial CBC's---recheck Monday  -  Consider H/O consult if further drop  12.BPH/urinary retention.  Flomax 0.4 mg daily, I/O cath prn 13.Constipation. stool "mushy" per staff--held senokot-s             -likely antibiotic effect             -Continue probiotic, stools decreased 13.   HTN       Vitals:   07/24/18 1955 07/25/18 0330  BP: 129/69 (!) 144/89  Pulse: 88 80  Resp: 20 18  Temp: 98.3 F (36.8 C) 99 F (37.2 C)  SpO2: 97% 98%  Adequate control  -initiated norvasc 5mg  daily 10/7 14.  Hypokalemia             Potassium 3.9 10/7  -recheck monday                    LOS: 16 days A FACE TO FACE EVALUATION WAS PERFORMED  [name] [name]  07/25/2018, 1:11 PM

## 2018-07-26 ENCOUNTER — Inpatient Hospital Stay (HOSPITAL_COMMUNITY): Payer: No Typology Code available for payment source

## 2018-07-26 NOTE — Progress Notes (Signed)
PHYSICAL MEDICINE & REHABILITATION PROGRESS NOTE   Subjective/Complaints:   Low back pain- chronic, no new areas.No radiation to LE ROS: Patient denies fever, rash, sore throat, blurred vision, nausea, vomiting, diarrhea, cough, shortness of breath or chest pain,   headache, or mood change.   Objective:   No results found. Recent Labs    07/24/18 0317  WBC 8.0  HGB 7.6*  HCT 25.4*  PLT 457*   No results for input(s): NA, K, CL, CO2, GLUCOSE, BUN, CREATININE, CALCIUM in the last 72 hours.  Intake/Output Summary (Last 24 hours) at 07/26/2018 6962 Last data filed at 07/26/2018 0600 Gross per 24 hour  Intake 480 ml  Output 1400 ml  Net -920 ml     Physical Exam: Vital Signs Blood pressure (!) 144/86, pulse 85, temperature 97.8 F (36.6 C), temperature source Oral, resp. rate 16, height 5\' 10"  (1.778 m), weight (!) 189.6 kg, SpO2 99 %.  Constitutional: No distress . Vital signs reviewed. obese HEENT: EOMI, oral membranes moist Neck: supple Cardiovascular: RRR without murmur. No JVD    Respiratory: CTA Bilaterally without wheezes or rales. Normal effort    GI: BS +, non-tender, non-distended  Musc: Lower extremity edema. LB remains tender. Left shoulder with improved PROM/AROM Neurological: He isalertand oriented  Motor:  5/5 in the right deltoid, bicep, tricep, grip 2 to 2+/5 right HE, KE, ADF unchanged 2-/5 LLE unchanged Skin:Intact.  Warm and dry. Back wound with vac Psych:pleasant and cooperative. Pain is not in area of wound vac, inferior, no erythema or skin breakdown  Assessment/Plan: 1. Functional deficits secondary to thoracic SCI, septic arthritis which require 3+ hours per day of interdisciplinary therapy in a comprehensive inpatient rehab setting.  Physiatrist is providing close team supervision and 24 hour management of active medical problems listed below.  Physiatrist and rehab team continue to assess barriers to discharge/monitor patient  progress toward functional and medical goals  Care Tool:  Bathing    Body parts bathed by patient: Left arm, Chest, Abdomen, Face   Body parts bathed by helper: Front perineal area, Buttocks, Right arm, Right upper leg, Left upper leg, Right lower leg, Left lower leg     Bathing assist Assist Level: Maximal Assistance - Patient 24 - 49%     Upper Body Dressing/Undressing Upper body dressing Upper body dressing/undressing activity did not occur (including orthotics): Safety/medical concerns What is the patient wearing?: Pull over shirt    Upper body assist Assist Level: Minimal Assistance - Patient > 75%    Lower Body Dressing/Undressing Lower body dressing      What is the patient wearing?: Pants     Lower body assist Assist for lower body dressing: Moderate Assistance - Patient 50 - 74%     Toileting Toileting Toileting Activity did not occur Press photographer and hygiene only): N/A (no void or bm)  Toileting assist Assist for toileting: 2 Helpers     Transfers Chair/bed transfer  Transfers assist     Chair/bed transfer assist level: Dependent - mechanical lift     Locomotion Ambulation   Ambulation assist   Ambulation activity did not occur: Safety/medical concerns          Walk 10 feet activity   Assist           Walk 50 feet activity   Assist           Walk 150 feet activity   Assist  Walk 10 feet on uneven surface  activity   Assist           Wheelchair     Assist Will patient use wheelchair at discharge?: Yes Type of Wheelchair: Manual Wheelchair activity did not occur: Safety/medical concerns  Wheelchair assist level: Supervision/Verbal cueing Max wheelchair distance: 150'    Wheelchair 50 feet with 2 turns activity    Assist    Wheelchair 50 feet with 2 turns activity did not occur: Safety/medical concerns   Assist Level: Supervision/Verbal cueing   Wheelchair 150 feet activity      Assist Wheelchair 150 feet activity did not occur: Safety/medical concerns   Assist Level: Supervision/Verbal cueing    Medical Problem List and Plan: 1.Decreased functional mobilitysecondary to left C5 radiculopathy,  thoracic cord contusion with myelopathy/chronic radiculopathy-status post lumbar wound debridement drainage irrigation 9/17/2019with wound VAC placement, septic arthritis left knee MSSA bacteremia             -Continue CIR therapies including PT, OT    2. DVT Prophylaxis/Anticoagulation: Subcutaneous Lovenox.             -dopplers negative 3. Pain Management:Robaxin and hydrocodone as needed- add Kpad before med adjustment  -  hydrocodone   7.5mg  q4 prn               -ACE wrap left knee for comfort as needed 4. Mood:Cymbalta 60 mg daily. Provide emotional support 5. Neuropsych: This patientiscapable of making decisions on hisown behalf. 6. Skin/Wound Care: wound closing             -WOC RN assisting with vac--  -1-2 more weeks of vac? 7. Fluids/Electrolytes/Nutrition: push po  -re-check labs next monday            8.ID/MSSA bacteremia. IV Ancef 2 g every 8 hours through 08/11/2018 and stoped. Follow-up infectious disease.  9.Septic arthritis left knee. Status post arthroscopy irrigation and drainage, no swelling or pain with range of motion 10.Left shoulder full-thickness retracted supraspinatus tendon tear. Conservative care             - improvement in left shoulder pain s/p steroid injection 10/3   11.Acute on chronic anemia. Normocytic  -Continue ferrous gluconate  -I personally reviewed the patient's labs today.               Hemoglobin 8.0 on 10/5--> 7.9 --> 7.3 10/9--> 7.6 10/11             -stool OB negative x2             -continue serial CBC's---recheck Monday  -  Consider H/O consult if further drop  12.BPH/urinary retention.  Flomax 0.4 mg daily, I/O cath prn 13.Constipation. stool "mushy" per staff--held  senokot-s             -likely antibiotic effect             -Continue probiotic, stools decreased 13.  HTN       Vitals:   07/25/18 1927 07/26/18 0548  BP: 119/64 (!) 144/86  Pulse: 93 85  Resp: 18 16  Temp: 98.6 F (37 C) 97.8 F (36.6 C)  SpO2: 98% 99%  Adequate control 10/13  -initiated norvasc 5mg  daily 10/7 14.  Hypokalemia             Potassium 3.9 10/7  -recheck monday                    LOS: 17 days A  FACE TO FACE EVALUATION WAS PERFORMED  [name] [name] 07/26/2018, 8:22 AM

## 2018-07-26 NOTE — Progress Notes (Signed)
Physical Therapy Session Note  Patient Details  Name: [name] [name] MRN: [id number] Date of Birth: 02/16/1956  Today's Date: 07/26/2018 PT Individual Time: 1002-1100 PT Individual Time Calculation (min): 58 min   Short Term Goals: Week 3:  PT Short Term Goal 1 (Week 3): pt will perform functional transfers with max A of 1 PT Short Term Goal 2 (Week 3): pt will perform rolling in bed with min A  Skilled Therapeutic Interventions/Progress Updates:    Pt seated in w/c upon PT arrival, agreeable to therapy tx and denies pain. Therapist donned shoes total assist for time management. Pt transported to gym in w/c. Pt performed slideboard transfer to the mat with max assist +2. Pt eager to try standing this session. Pt worked on standing attempts from elevated mat within stedy. Pt performed x 4 attempts, able to clear buttocks from mat about 2-3 inches, one therapist using sheet around hips for increased hip extension and second helper assisting with boost up. Pt performed lateral leans on the mat with min assist in order to place sarah plus sling. Pt performed sit<>stand using sarah plus and able to maintain standing x 1 min. Pt transferred using sarah plus from mat>w/c dependently. Pt transported to dayroom with handoff to OT for dance group.   Therapy Documentation Precautions:  Precautions Precautions: Fall Precaution Booklet Issued: No Precaution Comments: wound VAC; Restrictions Weight Bearing Restrictions: No    Therapy/Group: Individual Therapy  Cresenciano Genre, PT, DPT 07/26/2018, 7:56 AM

## 2018-07-27 ENCOUNTER — Inpatient Hospital Stay (HOSPITAL_COMMUNITY): Payer: No Typology Code available for payment source | Admitting: Physical Therapy

## 2018-07-27 ENCOUNTER — Inpatient Hospital Stay (HOSPITAL_COMMUNITY): Payer: No Typology Code available for payment source | Admitting: Occupational Therapy

## 2018-07-27 LAB — CBC
HCT: 25.6 % — ABNORMAL LOW (ref 39.0–52.0)
Hemoglobin: 7.5 g/dL — ABNORMAL LOW (ref 13.0–17.0)
MCH: 25.7 pg — ABNORMAL LOW (ref 26.0–34.0)
MCHC: 29.3 g/dL — AB (ref 30.0–36.0)
MCV: 87.7 fL (ref 80.0–100.0)
PLATELETS: 434 10*3/uL — AB (ref 150–400)
RBC: 2.92 MIL/uL — ABNORMAL LOW (ref 4.22–5.81)
RDW: 15.7 % — AB (ref 11.5–15.5)
WBC: 9.7 10*3/uL (ref 4.0–10.5)
nRBC: 0 % (ref 0.0–0.2)

## 2018-07-27 LAB — BASIC METABOLIC PANEL
Anion gap: 6 (ref 5–15)
BUN: 7 mg/dL (ref 6–20)
CALCIUM: 8.1 mg/dL — AB (ref 8.9–10.3)
CO2: 28 mmol/L (ref 22–32)
CREATININE: 0.67 mg/dL (ref 0.61–1.24)
Chloride: 104 mmol/L (ref 98–111)
GFR calc Af Amer: >60 mL/min (ref >60)
GFR calc non Af Amer: >60 mL/min (ref >60)
Glucose, Bld: 102 mg/dL — ABNORMAL HIGH (ref 70–99)
Potassium: 3.5 mmol/L (ref 3.5–5.1)
Sodium: 138 mmol/L (ref 135–145)

## 2018-07-27 MED ORDER — BOOST / RESOURCE BREEZE PO LIQD CUSTOM
1.0000 | Freq: Two times a day (BID) | ORAL | Status: DC
  Administered 2018-07-27 – 2018-08-04 (×14): 1 via ORAL

## 2018-07-27 NOTE — Progress Notes (Signed)
Nutrition Follow-up  DOCUMENTATION CODES:   Morbid obesity  INTERVENTION:  - d/c Ensure Enlive due to pt preference  - Boost Breeze po BID, each supplement provides 250 kcal and 9 grams of protein  - yogurt daily with breakfast  NUTRITION DIAGNOSIS:   Increased nutrient needs related to wound healing, post-op healing as evidenced by estimated needs.  Ongoing  GOAL:   Patient will meet greater than or equal to 90% of their needs  Progressing  MONITOR:   PO intake, Supplement acceptance, Labs, Weight trends, Skin, I & O's  REASON FOR ASSESSMENT:   Malnutrition Screening Tool    ASSESSMENT:   [name] [name] is a 62 year old right-handed male admitted 06/28/2018 with progressive lower extremity weakness as well as falls  Spoke with pt and wife at bedside. Pt states that he is "not doing too good" due to pain. Pt expresses frustration with medication regimen not controlling his pain, stating, "I need to get some better medicines."  Pt states that he does not want to have the Ensure Enlive anymore and will not drink it. Pt reports that he is willing to try Boost Breeze. RD to d/c Ensure Enlive order and order Boost Breeze BID.  Pt's wife would like pt to have some yogurt for probiotics. Pt agreeable to having yogurt daily with breakfast. RD to order on [employer] diet order.  Pt states that his appetite remains decreased. PO intake 50-100% x last 8 meals.  Medications reviewed and include: Dulcolax q 3 days, Oscal with D daily, Ensure Enlive daily, Fergon 324 mg TID with meals, MVI with minerals daily, Protonix 40 mg BID, K-dur 20 mEq BID, Florastor 250 mg BID, vitamin B-12 daily, IV antibiotics  Labs reviewed: hemoglobin 7.5 (L), HCT 25.6 (L)  UOP: 1050 ml x 24 hours  Diet Order:   Diet Order            Diet regular Room service appropriate? Yes; Fluid consistency: Thin  Diet effective now              EDUCATION NEEDS:   Education needs have been  addressed  Skin:  Skin Assessment: Skin Integrity Issues: Wound Vac: back Incisions: lt knee  Last BM:  10/7  Height:   Ht Readings from Last 1 Encounters:  07/09/18 5\' 10"  (1.778 m)    Weight:   Wt Readings from Last 1 Encounters:  07/15/18 (!) 189.6 kg    Ideal Body Weight:  75.4 kg  BMI:  Body mass index is 59.98 kg/m.  Estimated Nutritional Needs:   Kcal:  2200-2400  Protein:  125-150 grams  Fluid:  > 2.2 L    Earma Reading, MS, RD, LDN Inpatient Clinical Dietitian Pager: [phone] Weekend/After Hours: [phone]

## 2018-07-27 NOTE — Progress Notes (Signed)
Occupational Therapy Weekly Progress Note  Patient Details  Name: [name] [name] MRN: [id number] Date of Birth: 26-Apr-1956  Beginning of progress report period: 07/17/18 End of progress report period: 07/27/18  Today's Date: 07/27/2018 OT Individual Time: 6222-9798 OT Individual Time Calculation (min): 91 min    Patient has met 0 of 3 short term goals.    Patient continues to demonstrate the following deficits: muscle weakness and muscle paralysis, decreased cardiorespiratoy endurance, abnormal tone and unbalanced muscle activation and decreased sitting balance and decreased standing balance and therefore will continue to benefit from skilled OT intervention to enhance overall performance with BADL.  Patient progressing toward long term goals..  Continue plan of care.  OT Short Term Goals Week 2:  OT Short Term Goal 1 (Week 2): Pt will transfer to w/c with Max A of 2 in prep for [facility] transfer  OT Short Term Goal 1 - Progress (Week 2): Not met OT Short Term Goal 2 (Week 2): Pt will complete 1/3 components of donning pants with AE as needed OT Short Term Goal 2 - Progress (Week 2): Not met OT Short Term Goal 3 (Week 2): Pt will don footwear with Mod A using AE OT Short Term Goal 3 - Progress (Week 2): Not met Week 3:  OT Short Term Goal 1 (Week 3): Pt will complete BSC transfer with LRAD and 2 helpers  OT Short Term Goal 2 (Week 3): Pt will complete 1 grooming task in supported standing to improve standing tolerance  OT Short Term Goal 3 (Week 3): Pt will thread 1 LE into pants using reacher with Min A  Pt is making slow progress at time of report. He continues to require +2 assist for functional transfers and LB self care tasks. AE training has been initiated to increase functional independence during dressing. During tx sessions, pt continues to work on supported standing, general strengthening, and neuromuscular re-education for increasing independence with self care and functional  transfers. He remains highly motivated to participate in all sessions. Continue POC.   Skilled Therapeutic Interventions/Progress Updates:    Pt greeted in bed with c/o back pain. RN in to provide medication during session. Started tx with LB dressing bedlevel with setup of reacher. Due to decreased LE mobility, pt required Total A for threading LEs through pants. Attempted bridging for elevating pants over hips with 2 helpers supporting LEs, however pt unable to clear buttocks from bed. Rolling Rt>Lt completed with 2 helpers for elevating pants over hips. Maxi Sky utilized for transfer to recliner, where he donned shirt with setup. Next worked on standing tolerance/LE weightbearing via Clarise Cruz plus and walker sling. Longest standing window without rest 1 minute 10 seconds, then LEs were noted to buckle. Cues required for pushing through LEs and forward gaze. Afterwards worked on AE training for [employer] footwear. Pt used reacher, shoe funnel, and shoe horn with instruction. He required Total A due to LE weakness and inability to manage LE with 1 UE. For remainder of session, had pt complete towel slides for LEs in to increase mobility required for footwear. At end of session pt was left in recliner with LEs elevated. Per pt, recliner was much more comfortable than w/c. K-pad applied to back for pain mgt. He was left with call bell in hand.   Therapy Documentation Precautions:  Precautions Precautions: Fall Precaution Booklet Issued: No Precaution Comments: wound VAC; Restrictions Weight Bearing Restrictions: No Pain: 6/10 in back. Addressed as written above  Pain Assessment Pain Scale:  0-10 Pain Score: 4  Pain Location: Back Pain Orientation: Lower Pain Descriptors / Indicators: Aching Pain Intervention(s): Medication (See eMAR) ADL:        Therapy/Group: Individual Therapy  Shelbe Haglund A Kyla Duffy 07/27/2018, 7:45 AM

## 2018-07-27 NOTE — Progress Notes (Signed)
Physical Therapy Session Note  Patient Details  Name: [name] [name] MRN: [id number] Date of Birth: October 27, 1955  Today's Date: 07/27/2018 PT Individual Time: 1300-1326 PT Individual Time Calculation (min): 26 min   Short Term Goals: Week 3:  PT Short Term Goal 1 (Week 3): pt will perform functional transfers with max A of 1 PT Short Term Goal 2 (Week 3): pt will perform rolling in bed with min A  Skilled Therapeutic Interventions/Progress Updates:    pt up in recliner stating 9/10 back pain and unwilling to participate in session, asks to return to bed. Pt transferred with maxi sky back to bed.  Pt noted to be incontinent of bowel. Pt performs rolling in bed with min/mod A with use of bedrails, total A for hygiene and clothing management. Pt left in bed with hot pack applied to back. Pt missed 30 minutes skilled PT due to pain.  Therapy Documentation Precautions:  Precautions Precautions: Fall Precaution Booklet Issued: No Precaution Comments: wound VAC; Restrictions Weight Bearing Restrictions: No General: PT Amount of Missed Time (min): 30 Minutes PT Missed Treatment Reason: Pain Pain: Pt c/o 9/10 pain in back, refuses to participate in therapy stating he needs to go back to bed. RN made aware, hot pack applied after session   Therapy/Group: Individual Therapy  Babara Buffalo 07/27/2018, 1:27 PM

## 2018-07-27 NOTE — Progress Notes (Signed)
Occupational Therapy Session Note  Patient Details  Name: [name] [name] MRN: [id number] Date of Birth: [birth year]  Today's Date: 07/27/2018 OT Individual Time: 1400-1430 OT Individual Time Calculation (min): 30 min  and Today's Date: 07/27/2018 OT Missed Time: 30 Minutes Missed Time Reason: Pain   Short Term Goals: Week 3:  OT Short Term Goal 1 (Week 3): Pt will complete BSC transfer with LRAD and 2 helpers  OT Short Term Goal 2 (Week 3): Pt will complete 1 grooming task in supported standing to improve standing tolerance  OT Short Term Goal 3 (Week 3): Pt will thread 1 LE into pants using reacher with Min A  Skilled Therapeutic Interventions/Progress Updates:    Upon entering the room, pt supine in bed with k pad placed on lower back secondary to 7/10 back pain. RN gave medication prior to OT arrival. Child psychotherapist and pt's wife present as well. Education provided for pain management and discharge planning. Caregiver with several questions and once answered by therapist no further questions at this time. Pain possibly due to wound vac change earlier in morning based on trend of days with patient having increased pain. OT discussed with PA to try and change time of wound vac change if possible. Pt declined OT intervention at this time secondary to pain. Pt with all needs within reach upon exiting the room.   Therapy Documentation Precautions:  Precautions Precautions: Fall Precaution Booklet Issued: No Precaution Comments: wound VAC; Restrictions Weight Bearing Restrictions: No General: General OT Amount of Missed Time: 30 Minutes PT Missed Treatment Reason: Pain Vital Signs: Therapy Vitals Temp: 98.2 F (36.8 C) Temp Source: Oral Pulse Rate: 75 Resp: 16 BP: 125/80 Patient Position (if appropriate): Sitting Oxygen Therapy SpO2: 98 % O2 Device: Room Air Pain: Pain Assessment Pain Scale: 0-10 Pain Score: 2  Faces Pain Scale: Hurts a little bit Pain Type: Chronic  pain Pain Intervention(s): RN made aware ADL:   Vision   Perception    Praxis   Exercises:   Other Treatments:     Therapy/Group: Individual Therapy  Alen Bleacher 07/27/2018, 2:38 PM

## 2018-07-27 NOTE — Consult Note (Addendum)
WOC Nurse wound consult note Reason for Consult: Vac dressing change to middle back wound Chronic full thickness wound to middle back, beefy red Drainage (amount, consistency, odor)Mod amt pink drainage in the cannister. Pt denies need for pain meds.  Applied one piece black foam,  then bridged the track pad to his right flank.  Cont suction on at 100 mm.  WOC team will plan to change dressing Q M/W/F at 0800.  Cammie Mcgee MSN, RN, CWOCN, CWCN-AP, CNS

## 2018-07-27 NOTE — Progress Notes (Signed)
[place] PHYSICAL MEDICINE & REHABILITATION PROGRESS NOTE   Subjective/Complaints:   Back pain fairly stable but persistent. Can tolerate activities with therapy but has pain again as well after he completes activities.   ROS: Patient denies fever, rash, sore throat, blurred vision, nausea, vomiting, diarrhea, cough, shortness of breath or chest pain,  , headache, or mood change.    Objective:   No results found. Recent Labs    07/27/18 0434  WBC 9.7  HGB 7.5*  HCT 25.6*  PLT 434*   Recent Labs    07/27/18 0434  NA 138  K 3.5  CL 104  CO2 28  GLUCOSE 102*  BUN 7  CREATININE 0.67  CALCIUM 8.1*    Intake/Output Summary (Last 24 hours) at 07/27/2018 1016 Last data filed at 07/27/2018 0959 Gross per 24 hour  Intake 690 ml  Output 1100 ml  Net -410 ml     Physical Exam: Vital Signs Blood pressure 130/80, pulse 87, temperature 98.1 F (36.7 C), temperature source Oral, resp. rate 18, height 5\' 10"  (1.778 m), weight (!) 189.6 kg, SpO2 100 %.  Constitutional: No distress . Vital signs reviewed. HEENT: EOMI, oral membranes moist Neck: supple Cardiovascular: RRR without murmur. No JVD    Respiratory: CTA Bilaterally without wheezes or rales. Normal effort    GI: BS +, non-tender, non-distended  Musc: low=mid back tender to palpation. Left knee and left shoulder with tenderness as well.  Neurological: He isalertand oriented  Motor:  5/5 in the right deltoid, bicep, tricep, grip 2 to 2+/5 right HE, KE, ADF unchanged 2-/5 LLE unchanged Skin:Intact.  Warm and dry. Back wound with vac Psych:pleasant and cooperative.   Assessment/Plan: 1. Functional deficits secondary to thoracic SCI, septic arthritis which require 3+ hours per day of interdisciplinary therapy in a comprehensive inpatient rehab setting.  Physiatrist is providing close team supervision and 24 hour management of active medical problems listed below.  Physiatrist and rehab team continue to  assess barriers to discharge/monitor patient progress toward functional and medical goals  Care Tool:  Bathing    Body parts bathed by patient: Left arm, Chest, Abdomen, Face   Body parts bathed by helper: Front perineal area, Buttocks, Right arm, Right upper leg, Left upper leg, Right lower leg, Left lower leg     Bathing assist Assist Level: Maximal Assistance - Patient 24 - 49%     Upper Body Dressing/Undressing Upper body dressing Upper body dressing/undressing activity did not occur (including orthotics): Safety/medical concerns What is the patient wearing?: Pull over shirt    Upper body assist Assist Level: Minimal Assistance - Patient > 75%    Lower Body Dressing/Undressing Lower body dressing      What is the patient wearing?: Pants     Lower body assist Assist for lower body dressing: Moderate Assistance - Patient 50 - 74%     Toileting Toileting Toileting Activity did not occur Press photographer and hygiene only): N/A (no void or bm)  Toileting assist Assist for toileting: 2 Helpers     Transfers Chair/bed transfer  Transfers assist     Chair/bed transfer assist level: Dependent - mechanical lift     Locomotion Ambulation   Ambulation assist   Ambulation activity did not occur: Safety/medical concerns          Walk 10 feet activity   Assist           Walk 50 feet activity   Assist  Walk 150 feet activity   Assist           Walk 10 feet on uneven surface  activity   Assist           Wheelchair     Assist Will patient use wheelchair at discharge?: Yes Type of Wheelchair: Manual Wheelchair activity did not occur: Safety/medical concerns  Wheelchair assist level: Supervision/Verbal cueing Max wheelchair distance: 150'    Wheelchair 50 feet with 2 turns activity    Assist    Wheelchair 50 feet with 2 turns activity did not occur: Safety/medical concerns   Assist Level:  Supervision/Verbal cueing   Wheelchair 150 feet activity     Assist Wheelchair 150 feet activity did not occur: Safety/medical concerns   Assist Level: Supervision/Verbal cueing    Medical Problem List and Plan: 1.Decreased functional mobilitysecondary to left C5 radiculopathy,  thoracic cord contusion with myelopathy/chronic radiculopathy-status post lumbar wound debridement drainage irrigation 9/17/2019with wound VAC placement, septic arthritis left knee MSSA bacteremia             -Continue CIR therapies including PT, OT     -need to decide about dispo plan 2. DVT Prophylaxis/Anticoagulation: Subcutaneous Lovenox.             -dopplers negative 3. Pain Management:Robaxin and hydrocodone as needed- add Kpad before med adjustment  -  hydrocodone   7.5mg  q4 prn               -ACE wrap left knee for comfort as needed  -reviewed etiologies of ongoing back pain as well today 4. Mood:Cymbalta 60 mg daily. Provide emotional support 5. Neuropsych: This patientiscapable of making decisions on hisown behalf. 6. Skin/Wound Care: wound closing             -WOC RN assisting with vac--  - weeks of vac? 7. Fluids/Electrolytes/Nutrition: push po  -I personally reviewed all of the patient's labs today, and lab work is within normal limits.            8.ID/MSSA bacteremia. IV Ancef 2 g every 8 hours through 08/11/2018 and stoped. Follow-up infectious disease.  9.Septic arthritis left knee. Status post arthroscopy irrigation and drainage, no swelling or pain with range of motion 10.Left shoulder full-thickness retracted supraspinatus tendon tear. Conservative care             - improvement in left shoulder pain s/p steroid injection 10/3   11.Acute on chronic anemia. Normocytic  -Continue ferrous gluconate  -I personally reviewed the patient's labs today.               Hemoglobin 8.0 on 10/5--> 7.9 --> 7.3 10/9--> 7.6 10/11  ---> 7.5 10/14             -stool OB  negative x2             -continue serial CBC's  -  Consider H/O consult if further drop  12.BPH/urinary retention.  Flomax 0.4 mg daily, I/O cath prn 13.Constipation. stool "mushy" per staff--held senokot-s             -likely antibiotic effect             -Continue probiotic, stools decreased 13.  HTN       Vitals:   07/26/18 2145 07/27/18 0301  BP: 103/65 130/80  Pulse: 98 87  Resp: (!) 21 18  Temp: 98.7 F (37.1 C) 98.1 F (36.7 C)  SpO2: 100% 100%  Adequate control 10/14  -initiated  norvasc 5mg  daily 10/7 14.  Hypokalemia             Potassium 3.5 10/14                    LOS: 18 days A FACE TO FACE EVALUATION WAS PERFORMED  Ranelle Oyster 07/27/2018, 10:16 AM

## 2018-07-28 ENCOUNTER — Inpatient Hospital Stay (HOSPITAL_COMMUNITY): Payer: No Typology Code available for payment source

## 2018-07-28 ENCOUNTER — Inpatient Hospital Stay (HOSPITAL_COMMUNITY): Payer: No Typology Code available for payment source | Admitting: Occupational Therapy

## 2018-07-28 ENCOUNTER — Inpatient Hospital Stay (HOSPITAL_COMMUNITY): Payer: No Typology Code available for payment source | Admitting: Physical Therapy

## 2018-07-28 ENCOUNTER — Telehealth (INDEPENDENT_AMBULATORY_CARE_PROVIDER_SITE_OTHER): Payer: Self-pay | Admitting: *Deleted

## 2018-07-28 MED ORDER — MORPHINE SULFATE ER 15 MG PO TBCR
15.0000 mg | EXTENDED_RELEASE_TABLET | Freq: Two times a day (BID) | ORAL | Status: DC
  Administered 2018-07-28 – 2018-08-07 (×21): 15 mg via ORAL
  Filled 2018-07-28 (×21): qty 1

## 2018-07-28 NOTE — Progress Notes (Signed)
Physical Therapy Session Note  Patient Details  Name: [name] [name] MRN: [id number] Date of Birth: April 23, 1956  Today's Date: 07/28/2018 PT Individual Time: 1100-1200 PT Individual Time Calculation (min): 60 min   Short Term Goals: Week 3:  PT Short Term Goal 1 (Week 3): pt will perform functional transfers with max A of 1 PT Short Term Goal 2 (Week 3): pt will perform rolling in bed with min A  Skilled Therapeutic Interventions/Progress Updates:    Pt seated in w/c upon PT arrival, agreeable to therapy tx and reports pain low back pain 3/10. Pt transported from room>gym total assist. Pt worked on standing tolerance with sarah plus this session. Pt performed x 2 sit<>stands with sarah lift and then transferred to mat. Pt performed x 3 stands from high mat with improved hip/knee alignment. Pt able to maintain standing bouts from 30-40 sec before needing to sit. Pt performed lateral leans throughout session for placement of slings. Pt transported back to room and transferred to bed with maxi sky, dependent. Pt performed rolling with mod assist to doff sling, left supine with needs in reach.   Therapy Documentation Precautions:  Precautions Precautions: Fall Precaution Booklet Issued: No Precaution Comments: wound VAC; Restrictions Weight Bearing Restrictions: No    Therapy/Group: Individual Therapy  Cresenciano Genre, PT, DPT 07/28/2018, 7:58 AM

## 2018-07-28 NOTE — Patient Care Conference (Addendum)
Inpatient RehabilitationTeam Conference and Plan of Care Update Date: 07/28/2018   Time: 2:05 PM    Patient Name: [name] [name]      Medical Record Number: [id number]  Date of Birth: [birth year] Sex: Male         Room/Bed: 4W15C/4W15C-01 Payor Info: Payor: Advertising copywriter / Plan: GEHA / Product Type: *No Product type* /    Admitting Diagnosis: Epidural abcess  Admit Date/Time:  07/09/2018  5:13 PM Admission Comments: No comment available   Primary Diagnosis:  Thoracic myelopathy Principal Problem: Thoracic myelopathy  Patient Active Problem List   Diagnosis Date Noted  . Labile blood pressure   . Urinary retention   . Hypokalemia   . Hypertension   . Acute blood loss anemia   . Anemia of chronic disease   . Abscess in epidural space of lumbar spine   . Effusion, left knee   . Pain and swelling of wrist, left   . Complete tear of left rotator cuff 07/03/2018    Class: Chronic  . AKI (acute kidney injury) (HCC)   . MSSA bacteremia   . Septic arthritis of knee, left (HCC) 06/30/2018    Class: Acute  . Leukocytosis   . Postoperative seroma involving nervous system after nervous system procedure   . Shoulder pain, bilateral   . Thoracic myelopathy 06/28/2018  . Spondylosis, thoracic, with myelopathy 06/28/2018    Class: Acute  . Abscess in epidural space of L2-L5 lumbar spine 06/28/2018    Class: Acute  . Acute urinary retention 06/28/2018  . Left cervical radiculopathy 06/28/2018    Class: Acute  . Spinal stenosis of lumbar region 11/17/2017    Class: Chronic  . Status post lumbar laminectomy 11/17/2017  . Lumbar stenosis with neurogenic claudication 08/18/2017  . Forestier's disease of thoracolumbar region 08/18/2017  . Degenerative disc disease, lumbar 08/18/2017  . Lumbar radiculopathy 09/30/2016  . Morbid (severe) obesity due to excess calories (HCC) 09/30/2016  . Biliary calculus with acute cholecystitis 03/21/2016  . Acute pyelonephritis 12/28/2013  . Ureteral  stone with hydronephrosis 12/28/2013  . Acute kidney failure (HCC) 12/27/2013  . Severe sepsis with acute organ dysfunction (HCC) 12/27/2013  . Hypotension 12/27/2013  . Lactic acidosis 12/27/2013  . Sepsis secondary to UTI (HCC) 12/27/2013  . EMPYEMA CHEST 08/30/2008  . PRURIGO 08/30/2008  . OBSTRUCTIVE SLEEP APNEA 08/30/2008    Expected Discharge Date: Expected Discharge Date: (SNF)  Team Members Present: Physician leading conference: Dr. Faith Rogue Social Worker Present: Staci Acosta, LCSW Nurse Present: Carmie End, RN PT Present: Teodoro Kil, PT OT Present: Callie Fielding, OT SLP Present: Colin Benton, SLP PPS Coordinator present : Tora Duck, RN, CRRN     Current Status/Progress Goal Weekly Team Focus  Medical   Patient has made some gains in respect to his pain.  Left shoulder has been more free to move as has his left knee.  Back remains an issue however due to the significant injury he has sustained there.  Patient is attempting to work through pain and expressing some neurological improvement.  Reduce pain and moved to dressing as opposed to vacuum for his low back  Continue to treat pain as above.  Maximize nutrition and volume status.  Maintain skin   Bowel/Bladder   incont of bowel (loose stools); I&O cath; lbm 10/14  cont of b/b with mod assist  assess q shift and prn Q4hr; encourage to attempt void; bowel program   Swallow/Nutrition/ Hydration  ADL's   +2 for Maxi Sky transfers + LB self care bedlevel. Setup UB dressing. On night baths.   Min-Mod A overall   Functional transfers, general strengthening and endurance, NMR, pt education, balance    Mobility   mod A of 2 for sliding board, maxi sky transfers with nursing and from bed, mod A bed mobility  mod assist w/c level  activity tolerance, strength, decrease pain, w/c mobility   Communication             Safety/Cognition/ Behavioral Observations            Pain   c/o pain to lower  back;   <3 out of 10  assess q shift and prn; medicate as needed   Skin   scattered chemical burns (old/healed); wound vac ; dsg changed 10/14  free from infection/breakdown  assess q shift and prn    Rehab Goals Patient on target to meet rehab goals: No Rehab Goals Revised: none *See Care Plan and progress notes for long and short-term goals.     Barriers to Discharge  Current Status/Progress Possible Resolutions Date Resolved   Physician    Medical stability        Potentially remove VAC later this week or early next.  Reduce pain      Nursing                  PT                    OT                  SLP                SW                Discharge Planning/Teaching Needs:  Pt is not on target to reach min/mod A goals and will need more rehabilitation and nursing care after CIR.  Pt is too much care at his current level to be managed at home.  CSW has begun discussions with pt and his wife about possible SNF care prior to d/c home and will now start SNF search/process.  Defer family education to next venue.   Team Discussion:  Pt with ongoing wound issues, which causes pain.  MS Contin has been added to help with pain.  Pt is still incontinent and sometimes needs to be cathed due to going 8 hours without voiding.  Holding suppository due to loose stools.  Pain and bowel movements limit pt in therapy.  Pt needs +2 with slide board tx, doing somewhat better with sara.  Pt is mod A bed mobility and PT will continue to work on txs.  Pt can thread pants with reacher and his arms are getting stronger.  Pt's wound VAC schedule has been made later on mondays and fridays, but Wednesday needs to stay the same because the doctor rounds.  Pt is very motivated and does everything asked of him.    Revisions to Treatment Plan:  none    Continued Need for Acute Rehabilitation Level of Care: The patient requires daily medical management by a physician with specialized training in physical  medicine and rehabilitation for the following conditions: Daily direction of a multidisciplinary physical rehabilitation program to ensure safe treatment while eliciting the highest outcome that is of practical value to the patient.: Yes Daily medical management of patient stability for increased activity during participation in an intensive rehabilitation  regime.: Yes Daily analysis of laboratory values and/or radiology reports with any subsequent need for medication adjustment of medical intervention for : Post surgical problems;Neurological problems   I attest that I was present, lead the team conference, and concur with the assessment and plan of the team.   Kien Mirsky, [place] 07/28/2018, 2:43 PM

## 2018-07-28 NOTE — Progress Notes (Signed)
[name] PHYSICAL MEDICINE & REHABILITATION PROGRESS NOTE   Subjective/Complaints:  Back pain is improved but still limiting. Wants to be able to do more in therapies.  ROS: Patient denies fever, rash, sore throat, blurred vision, nausea, vomiting, diarrhea, cough, shortness of breath or chest pain, joint or back pain, headache, or mood change.   Objective:   No results found. Recent Labs    07/27/18 0434  WBC 9.7  HGB 7.5*  HCT 25.6*  PLT 434*   Recent Labs    07/27/18 0434  NA 138  K 3.5  CL 104  CO2 28  GLUCOSE 102*  BUN 7  CREATININE 0.67  CALCIUM 8.1*    Intake/Output Summary (Last 24 hours) at 07/28/2018 1307 Last data filed at 07/28/2018 1235 Gross per 24 hour  Intake 580 ml  Output 1005 ml  Net -425 ml     Physical Exam: Vital Signs Blood pressure 124/81, pulse 89, temperature 97.6 F (36.4 C), resp. rate 18, height 5\' 10"  (1.778 m), weight (!) 189.6 kg, SpO2 100 %.  Constitutional: No distress . Vital signs reviewed. obese HEENT: EOMI, oral membranes moist Neck: supple Cardiovascular: RRR without murmur. No JVD    Respiratory: CTA Bilaterally without wheezes or rales. Normal effort    GI: BS +, non-tender, non-distended  Musc: low=mid back tender to palpation. Left knee and left shoulder with tenderness as well.  Neurological: He isalertand oriented  Motor:  5/5 in the right deltoid, bicep, tricep, grip 2 to 2+/5 right HE, KE, ADF stable 2-/5 LLE stable Skin:vac on back Psych:pleasant and cooperative.   Assessment/Plan: 1. Functional deficits secondary to thoracic SCI, septic arthritis which require 3+ hours per day of interdisciplinary therapy in a comprehensive inpatient rehab setting.  Physiatrist is providing close team supervision and 24 hour management of active medical problems listed below.  Physiatrist and rehab team continue to assess barriers to discharge/monitor patient progress toward functional and medical goals  Care  Tool:  Bathing    Body parts bathed by patient: Left arm, Chest, Abdomen, Face   Body parts bathed by helper: Front perineal area, Buttocks, Right arm, Right upper leg, Left upper leg, Right lower leg, Left lower leg     Bathing assist Assist Level: Maximal Assistance - Patient 24 - 49%     Upper Body Dressing/Undressing Upper body dressing Upper body dressing/undressing activity did not occur (including orthotics): Safety/medical concerns What is the patient wearing?: Hospital gown only    Upper body assist Assist Level: Set up assist    Lower Body Dressing/Undressing Lower body dressing      What is the patient wearing?: Pants     Lower body assist Assist for lower body dressing: 2 Helpers     Toileting Toileting Toileting Activity did not occur (Clothing management and hygiene only): N/A (no void or bm)  Toileting assist Assist for toileting: 2 Helpers     Transfers Chair/bed transfer  Transfers assist     Chair/bed transfer assist level: Dependent - mechanical lift     Locomotion Ambulation   Ambulation assist   Ambulation activity did not occur: Safety/medical concerns          Walk 10 feet activity   Assist           Walk 50 feet activity   Assist           Walk 150 feet activity   Assist           Walk 10  feet on uneven surface  activity   Assist           Wheelchair     Assist Will patient use wheelchair at discharge?: Yes Type of Wheelchair: Manual Wheelchair activity did not occur: Safety/medical concerns  Wheelchair assist level: Supervision/Verbal cueing Max wheelchair distance: 150'    Wheelchair 50 feet with 2 turns activity    Assist    Wheelchair 50 feet with 2 turns activity did not occur: Safety/medical concerns   Assist Level: Supervision/Verbal cueing   Wheelchair 150 feet activity     Assist Wheelchair 150 feet activity did not occur: Safety/medical concerns   Assist Level:  Supervision/Verbal cueing    Medical Problem List and Plan: 1.Decreased functional mobilitysecondary to left C5 radiculopathy,  thoracic cord contusion with myelopathy/chronic radiculopathy-status post lumbar wound debridement drainage irrigation 9/17/2019with wound VAC placement, septic arthritis left knee MSSA bacteremia             -team conference today     -need to decide about dispo plan 2. DVT Prophylaxis/Anticoagulation: Subcutaneous Lovenox.             -dopplers negative 3. Pain Management:Robaxin and hydrocodone as needed-   -  hydrocodone   7.5mg  q4 prn               -ACE wrap left knee for comfort as needed  -will introduce ms contin 15mg  q12 to better control pain 4. Mood:Cymbalta 60 mg daily. Provide emotional support 5. Neuropsych: This patientiscapable of making decisions on hisown behalf. 6. Skin/Wound Care: wound closing             -WOC RN assisting with vac--  -ortho following 7. Fluids/Electrolytes/Nutrition: push po            8.ID/MSSA bacteremia. IV Ancef 2 g every 8 hours through 08/11/2018 and stoped. Follow-up infectious disease.  9.Septic arthritis left knee. Status post arthroscopy irrigation and drainage, no swelling or pain with range of motion 10.Left shoulder full-thickness retracted supraspinatus tendon tear. Conservative care             - improvement in left shoulder pain s/p steroid injection 10/3   11.Acute on chronic anemia. Normocytic  -Continue ferrous gluconate  -I personally reviewed the patient's labs today.               Hemoglobin 8.0 on 10/5--> 7.9 --> 7.3 10/9--> 7.6 10/11  ---> 7.5 10/14----follow up again on Thursday             -stool OB negative x2             -continue serial CBC's  -  Consider H/O consult if further drop  12.BPH/urinary retention.  Flomax 0.4 mg daily, I/O cath prn 13.Constipation. stool "mushy" per staff--held senokot-s             -likely antibiotic effect              -Continue probiotic, stools decreased 13.  HTN       Vitals:   07/27/18 1946 07/28/18 0440  BP: 106/68 124/81  Pulse: (!) 101 89  Resp: 20 18  Temp: 99.5 F (37.5 C) 97.6 F (36.4 C)  SpO2: 99% 100%  Adequate control 10/15  -initiated norvasc 5mg  daily 10/7 14.  Hypokalemia             Potassium 3.5 10/14  LOS: 19 days A FACE TO FACE EVALUATION WAS PERFORMED  Ranelle Oyster 07/28/2018, 1:07 PM

## 2018-07-28 NOTE — Progress Notes (Signed)
Occupational Therapy Session Note  Patient Details  Name: [name] MRN: [id number] Date of Birth: 1956/07/01  Today's Date: 07/28/2018 OT Individual Time: 4098-1191 OT Individual Time Calculation (min): 70 min    Short Term Goals: Week 3:  OT Short Term Goal 1 (Week 3): Pt will complete BSC transfer with LRAD and 2 helpers  OT Short Term Goal 2 (Week 3): Pt will complete 1 grooming task in supported standing to improve standing tolerance  OT Short Term Goal 3 (Week 3): Pt will thread 1 LE into pants using reacher with Min A  Skilled Therapeutic Interventions/Progress Updates:    Upon entering the room, pt supine in bed with c/o soreness in back but agreeable to OT intervention. Pt utilized reacher to thread pants onto B feet with increased time of 20 minutes. Pt rolling L <> R with mod A and use of bed rail with assistance to pull pants over B hips. Sling placed under pt and maxi sky utilized to transfer pt into wheelchair. NT arrived to scan pt's bladder. Pt propelled wheelchair with supervision to sink for grooming task at mod I level while seated. Pt remained in wheelchair at end of session with call bell and all needed items within reach.   Therapy Documentation Precautions:  Precautions Precautions: Fall Precaution Booklet Issued: No Precaution Comments: wound VAC; Restrictions Weight Bearing Restrictions: No Pain: Pain Assessment Pain Scale: 0-10 Pain Score: 6  Pain Location: Back Pain Orientation: Lower Pain Descriptors / Indicators: Aching Pain Frequency: Intermittent Pain Onset: With Activity Patients Stated Pain Goal: 2 Pain Intervention(s): Medication (See eMAR);Repositioned;Heat applied Multiple Pain Sites: No   Therapy/Group: Individual Therapy  Alen Bleacher 07/28/2018, 12:47 PM

## 2018-07-28 NOTE — Progress Notes (Signed)
Occupational Therapy Session Note  Patient Details  Name: Srikar Chiang MRN: [id number] Date of Birth: 1956/08/14  Today's Date: 07/28/2018 OT Individual Time: 1430-1456  OT Individual Time Calculation (min): 26 min (Make-up session)   Short Term Goals: Week 3:  OT Short Term Goal 1 (Week 3): Pt will complete BSC transfer with LRAD and 2 helpers  OT Short Term Goal 2 (Week 3): Pt will complete 1 grooming task in supported standing to improve standing tolerance  OT Short Term Goal 3 (Week 3): Pt will thread 1 LE into pants using reacher with Min A  Skilled Therapeutic Interventions/Progress Updates:  Pt seen for OT make-up session focusing on functional transfers and pt directing care. Pt sitting EOM in therapy gym upon arrival with hand off from PT. Pt rates pain 5/10 in lower back, reports being up to date on all pain meds and denying intervention. He completed sliding board transfer EOM>w/c, +2 assist required to stabilize equipment, however, pt able to slide L>R across board with min A with cuing for head/hip relationship and hand placement. Pt ultimately required +3 assist to scoot posteriorly back into w/c for proper and safe positioning, difficulty adjusting hips back into chair 2/2 fatigue and body habitus. Pt returned to room, needed transfer back to bed for cathing, maxi-sky total A back to bed. Using hospital bed functions and +2 assist pt able to assist with adjusting up in bed and rolling with min A once LE placed in order for Maxi-sky pad to be removed. Pt left in supine, all needs in reach and wife present.      Therapy Documentation Precautions:  Precautions Precautions: Fall Precaution Booklet Issued: No Precaution Comments: wound VAC; Restrictions Weight Bearing Restrictions: No   Therapy/Group: Individual Therapy  Kaiyden Simkin L 07/28/2018, 7:39 AM

## 2018-07-28 NOTE — Telephone Encounter (Signed)
I have seen this patient since this note and entered info into his chart at hospital, he needs the [facility] until wound is healed. jen

## 2018-07-28 NOTE — Telephone Encounter (Signed)
[name] wound nurse for [facility] called wanting to set up appt with Dr. Otelia Sergeant for the Back dressing change for this pt. Pt is in 4W15.   Please call to schedule appt  [phone]

## 2018-07-28 NOTE — Evaluation (Signed)
Recreational Therapy Assessment and Plan  Patient Details  Name: [name] [name] MRN: [id number] Date of Birth: [birth year] Today's Date: 07/28/2018  Rehab Potential: Fair ELOS: discharge to SNF  Assessment  Problem List:       Patient Active Problem List   Diagnosis Date Noted  . Abscess in epidural space of lumbar spine   . Effusion, left knee   . Pain and swelling of wrist, left   . Complete tear of left rotator cuff 07/03/2018    Class: Chronic  . AKI (acute kidney injury) ([place])   . MSSA bacteremia   . Septic arthritis of knee, left ([name]) 06/30/2018    Class: Acute  . Leukocytosis   . Postoperative seroma involving nervous system after nervous system procedure   . Shoulder pain, bilateral   . Thoracic myelopathy 06/28/2018  . Spondylosis, thoracic, with myelopathy 06/28/2018    Class: Acute  . Abscess in epidural space of L2-L5 lumbar spine 06/28/2018    Class: Acute  . Acute urinary retention 06/28/2018  . Left cervical radiculopathy 06/28/2018    Class: Acute  . Spinal stenosis of lumbar region 11/17/2017    Class: Chronic  . Status post lumbar laminectomy 11/17/2017  . Lumbar stenosis with neurogenic claudication 08/18/2017  . Forestier's disease of thoracolumbar region 08/18/2017  . Degenerative disc disease, lumbar 08/18/2017  . Lumbar radiculopathy 09/30/2016  . Morbid (severe) obesity due to excess calories ([name]) 09/30/2016  . Biliary calculus with acute cholecystitis 03/21/2016  . Acute pyelonephritis 12/28/2013  . Ureteral stone with hydronephrosis 12/28/2013  . Acute kidney failure ([name]) 12/27/2013  . Severe sepsis with acute organ dysfunction ([name]) 12/27/2013  . Hypotension 12/27/2013  . Lactic acidosis 12/27/2013  . Sepsis secondary to UTI ([name]) 12/27/2013  . EMPYEMA CHEST 08/30/2008  . PRURIGO 08/30/2008  . OBSTRUCTIVE SLEEP APNEA 08/30/2008    Past Medical History:      Past Medical History:  Diagnosis Date  . Anemia    . Arthritis   . Chronic kidney disease    HX acute kidney failure / acute pyelonephritis / hydronephrosis / severe sepsis per discharge summary 12/27/13  . History of kidney stones   . Hypertension   . Morbid obesity ([name])   . Obstructive sleep apnea    does not need c pap since 110 lb wt loss  . Osteoporosis   . Prurigo 2002  . Scars    ON ARMS FROM CHEMICAL EXPLOSION 1999  . Spinal stenosis    Past Surgical History:       Past Surgical History:  Procedure Laterality Date  . BACK SURGERY  01/23/2010   lumbar  . CHOLECYSTECTOMY N/A 03/24/2016   Procedure: LAPAROSCOPIC CHOLECYSTECTOMY WITH INTRAOPERATIVE CHOLANGIOGRAM;  Surgeon: Autumn Messing III, MD;  Location: [facility];  Service: General;  Laterality: N/A;  . CIRCUMCISION    . CYSTOSCOPY WITH RETROGRADE PYELOGRAM, URETEROSCOPY AND STENT PLACEMENT Left 01/20/2014   Procedure: CYSTOSCOPY WITH RETROGRADE PYELOGRAM, URETEROSCOPY AND STENT EXCHANGE;  Surgeon: Molli Hazard, MD;  Location: [facility];  Service: Urology;  Laterality: Left;  bugbee bladder fulguration  . CYSTOSCOPY WITH STENT PLACEMENT Left 12/28/2013   Procedure: CYSTOSCOPY WITH STENT PLACEMENT left retrograde pyleogram;  Surgeon: Molli Hazard, MD;  Location: [facility];  Service: Urology;  Laterality: Left;  . ESOPHAGOGASTRODUODENOSCOPY N/A 12/07/2012   Procedure: ESOPHAGOGASTRODUODENOSCOPY (EGD);  Surgeon: Madilyn Hook, DO;  Location: [facility];  Service: General;  Laterality: N/A;  . HOLMIUM LASER APPLICATION Left 04/21/2955   Procedure: HOLMIUM LASER  APPLICATION;  Surgeon: Molli Hazard, MD;  Location: [facility];  Service: Urology;  Laterality: Left;  . KNEE ARTHROSCOPY Left 06/29/2018   Procedure: ARTHROSCOPY KNEE;  Surgeon: Jessy Oto, MD;  Location: [place];  Service: Orthopedics;  Laterality: Left;  . LAPAROSCOPIC GASTRIC SLEEVE RESECTION N/A 12/07/2012   Procedure: LAPAROSCOPIC GASTRIC SLEEVE RESECTION;  Surgeon: Madilyn Hook, DO;  Location:  [facility];  Service: General;  Laterality: N/A;  laparoscopic sleeve gastrectomy with EGD  . LUMBAR LAMINECTOMY N/A 11/17/2017   Procedure: L2-3 LAMINECTOMY AND REDO LAMINECTOMIES  L3-4, L4-5 AND L5-S1;  Surgeon: Jessy Oto, MD;  Location: [name];  Service: Orthopedics;  Laterality: N/A;  . LUMBAR WOUND DEBRIDEMENT N/A 06/29/2018   Procedure: LUMBAR WOUND DEBRIDEMENT DRAINAGE AND IRRIGATION; AND ASPIRATION OF LEFT KNEE;  Surgeon: Jessy Oto, MD;  Location: [place];  Service: Orthopedics;  Laterality: N/A;  . TEE WITHOUT CARDIOVERSION N/A 07/01/2018   Procedure: TRANSESOPHAGEAL ECHOCARDIOGRAM (TEE);  Surgeon: Larey Dresser, MD;  Location: [facility];  Service: Cardiovascular;  Laterality: N/A;    Assessment & Plan Clinical Impression: [name] [name] a 62 year old right-handed male admitted 06/28/2018 with progressive lower extremity weakness as well as falls. Per chart review patient lives with spouse. Needed assistance for ADLs prior to admission ambulating short distances with a rolling walker and was receiving outpatient therapies. Noted history of lumbar multilevel central laminectomy for recurrent lumbar spinal stenosis with multiple back surgeries and resultant foot drop. MRI thoracic lumbar spine showed no fracture or malalignment. Subcentimeter cord edema T7-8 no syrinx. Moderate to severe paraspinal muscle atrophy. Multilevel neuroforaminal narrowing severe at T2-3, T9-10 and T10-11 as well as a 2.9 x 4.3 x 6.1 fluid collection right L4-5 facet effusion. Patient underwent lumbar wound debridement drainage irrigation 06/29/2018 per Dr. Louanne Skye as well as aspiration of left knee consistent with septic arthritis. Aspiration lumbar abscess positive for Staphylococcus aureus. Infectious disease consulted. TEE completed negative for vegetation. Placed on intravenous Ancef per infectious disease and plan to continue through 08/10/2018. Nephrology consulted for elevated creatinine 2.43  and renal ultrasound showed no hydronephrosis. Placed on gentle IV fluids creatinine has improved to 0.87. Patient with complaints of left shoulder pain MRI completed showing full-thickness retracted supraspinatus tendon tear with current conservative care recommended. A follow-up MRI cervical spine completed for neck pain as well as left bicep weakness showing neuroforaminal narrowing C2-3 through C6-7. Severe from C3-4 through C6-7. Current plans to follow-up outpatient neurosurgery Dr. Annette Stable. Patient also with persistent left wrist pain as well as swelling. X-rays of left wrist 06/29/2018 showing extensive soft tissue swelling over the dorsum of the wrist without underlying fracture. CT of wrist ordered 07/08/2018 showed no evidence of septic joint or osteomyelitis. Nonspecific soft tissue edema without focal fluid collection. Patient currently on subcutaneous Lovenox for DVT prophylaxis. Wound care nurse follow-up for surgical lumbar incision with wound VAC dressing changes as recommendedM,W,F. Patient with bouts of urinary retention working with voiding trials and maintained on Flomax. Patient is tolerating a regular diet. Therapy evaluations completed with recommendations of physical medicine rehab consult. Patient was admitted for a comprehensive rehab program9/26/19.    Patient presents with muscle paralysis and L supraspinatus tear, decreased cardiorespiratoy endurance, decreased activity tolerance, decreased balance, decreased coordination Limiting pt's independence with leisure/community pursuits.    Plan Min 1 TR session >30 1 out of 7 days Recommendations for other services: None   Discharge Criteria: Patient will be discharged from TR if patient refuses treatment 3 consecutive  times without medical reason.  If treatment goals not met, if there is a change in medical status, if patient makes no progress towards goals or if patient is discharged from hospital.  The above  assessment, treatment plan, treatment alternatives and goals were discussed and mutually agreed upon: by patient  [place] 07/28/2018, 3:52 PM

## 2018-07-28 NOTE — Progress Notes (Signed)
Physical Therapy Session Note  Patient Details  Name: [name] [name] MRN: [id number] Date of Birth: 10-16-1955  Today's Date: 07/28/2018 PT Individual Time: 1330-1430 PT Individual Time Calculation (min): 60 min   Short Term Goals: Week 3:  PT Short Term Goal 1 (Week 3): pt will perform functional transfers with max A of 1 PT Short Term Goal 2 (Week 3): pt will perform rolling in bed with min A  Skilled Therapeutic Interventions/Progress Updates:    maxi sky transfer to w/c, mod A for rolling to place sling.  Sliding board transfer with max of 2 assist with pt able to assist but difficultly bearing wt through LEs.  Sit <> Stand and standing tolerance in sara with assist to position LEs, pt able to tolerate 30-45 seconds of standing before limited by LE fatigue and back pain.  Seated balance with ball toss and LE therex with AAROM for bilat LEs, seated balance with supervision.  Pt left on mat table with hand off to OT.  Therapy Documentation Precautions:  Precautions Precautions: Fall Precaution Booklet Issued: No Precaution Comments: wound VAC; Restrictions Weight Bearing Restrictions: No Pain: Pt c/o back pain with standing, improves with seated rest   Therapy/Group: Individual Therapy  DONAWERTH,KAREN 07/28/2018, 2:32 PM

## 2018-07-28 NOTE — Telephone Encounter (Signed)
I called and lmom for Dawn to call me back.

## 2018-07-29 ENCOUNTER — Inpatient Hospital Stay (HOSPITAL_COMMUNITY): Payer: No Typology Code available for payment source | Admitting: *Deleted

## 2018-07-29 ENCOUNTER — Inpatient Hospital Stay (HOSPITAL_COMMUNITY): Payer: No Typology Code available for payment source | Admitting: Physical Therapy

## 2018-07-29 ENCOUNTER — Inpatient Hospital Stay (HOSPITAL_COMMUNITY): Payer: No Typology Code available for payment source | Admitting: Occupational Therapy

## 2018-07-29 DIAGNOSIS — G894 Chronic pain syndrome: Secondary | ICD-10-CM

## 2018-07-29 MED ORDER — CALCIUM POLYCARBOPHIL 625 MG PO TABS
625.0000 mg | ORAL_TABLET | Freq: Every day | ORAL | Status: DC
  Administered 2018-07-29 – 2018-08-05 (×8): 625 mg via ORAL
  Filled 2018-07-29 (×8): qty 1

## 2018-07-29 NOTE — Progress Notes (Signed)
Occupational Therapy Session Note  Patient Details  Name: Eusevio Schriver MRN: [id number] Date of Birth: 28-Oct-1955  Today's Date: 07/29/2018 OT Individual Time: 1045-1140 OT Individual Time Calculation (min): 55 min    Short Term Goals: Week 1:  OT Short Term Goal 1 (Week 1): Pt will transfer to w/c MAX A of 2 to w/c in prep for BSC transfer OT Short Term Goal 1 - Progress (Week 1): Progressing toward goal OT Short Term Goal 2 (Week 1): Pt will don shirt MOD A OT Short Term Goal 2 - Progress (Week 1): Met OT Short Term Goal 3 (Week 1): Pt will recall hemi dressing techniques for UB dressing with min question cues OT Short Term Goal 3 - Progress (Week 1): Met OT Short Term Goal 4 (Week 1): Pt will groom at sink wiht supervision and AE PRN OT Short Term Goal 4 - Progress (Week 1): Met Week 2:  OT Short Term Goal 1 (Week 2): Pt will transfer to w/c with Max A of 2 in prep for [facility] transfer  OT Short Term Goal 1 - Progress (Week 2): Not met OT Short Term Goal 2 (Week 2): Pt will complete 1/3 components of donning pants with AE as needed OT Short Term Goal 2 - Progress (Week 2): Not met OT Short Term Goal 3 (Week 2): Pt will don footwear with Mod A using AE OT Short Term Goal 3 - Progress (Week 2): Not met Week 3:  OT Short Term Goal 1 (Week 3): Pt will complete BSC transfer with LRAD and 2 helpers  OT Short Term Goal 2 (Week 3): Pt will complete 1 grooming task in supported standing to improve standing tolerance  OT Short Term Goal 3 (Week 3): Pt will thread 1 LE into pants using reacher with Min A  Skilled Therapeutic Interventions/Progress Updates:    1:1 Pt in bed when arrived and requested to not work on standing this session due to pain. Focused on Le strengthening while in bed in supine with maxi slides underneath LE for less resistance. Pt with more active movement in right Le including hip adduction, knee flexion and hip flexion. Transitioned to EOB with max A (assistance for bilateral  LEs and trunk). Sitting EOB focus on same LE exercises against gravity with maxi slides still under Les. Pt required A for hip abduction and knee extension. Also perform UEs exercises with 2 lb weight and stretches with A for left UE. Pt requested to lay back down to due to pain and eat lunch in bed due to ongoing pain. Mod A to return to supine in bed. RN called to request meds. Left in bed to rest.   Therapy Documentation Precautions:  Precautions Precautions: Fall Precaution Booklet Issued: No Precaution Comments: wound VAC; Restrictions Weight Bearing Restrictions: No General: General OT Amount of Missed Time: 20 Minutes Pain:  pain in back and missed 20 min due to requesting to lay down and requested pain meds  Therapy/Group: Individual Therapy  [name] [facility] 07/29/2018, 3:35 PM

## 2018-07-29 NOTE — Progress Notes (Signed)
Social Work Patient ID: [name], male   DOB: 1956/02/08, 62 y.o.   MRN: [id number]   CSW met with pt and his wife 07-28-18 after team conference to update them on discussion.  Pt and wife both are aware of pt's slow progress due to pain and fatigue limiting him.  Team is concerned that pt may need more care than can be provided in a home setting and pt/wife agree.  CSW discussed looking into SNF transfer and pt/wife feel that would be appropriate at this time due to pt's condition.  CSW provided them with a SNF list and will initiate the SNF search process.  Pt has private insurance, so CSW will alert insurance case manager of the above.  CSW remains available to assist as needed.
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
## 2018-07-29 NOTE — Progress Notes (Signed)
Physical Therapy Session Note  Patient Details  Name: [name] [name] MRN: [id number] Date of Birth: 1956-03-05  Today's Date: 07/29/2018 PT Individual Time: 0900-1000 PT Individual Time Calculation (min): 60 min   Short Term Goals: Week 3:  PT Short Term Goal 1 (Week 3): pt will perform functional transfers with max A of 1 PT Short Term Goal 2 (Week 3): pt will perform rolling in bed with min A  Skilled Therapeutic Interventions/Progress Updates:    Pt received supine in bed, reports 2/10 pain in low back and is using heating pad for relief. Pt agreeable to PT. Rolling L/R with min A and use of bedrails for donning of brief, pants, and placement of maxisky sling. Maxisky transfer bed to w/c. Manual w/c propulsion x 50 ft with BUE and Supervision. Sit to stand x 2 reps to [facility] with manual assist to prevent lateral movement of knees in [employer]. Pt tolerates standing about 20 sec each bout. Pt has incontinence of bowel while standing. Maxisky transfer w/c to bed. Pt left in care of NA for pericare and brief change due to time constraints.  Therapy Documentation Precautions:  Precautions Precautions: Fall Precaution Booklet Issued: No Precaution Comments: wound VAC; Restrictions Weight Bearing Restrictions: No  Therapy/Group: Individual Therapy  Peter Congo, PT, DPT  07/29/2018, 10:35 AM

## 2018-07-29 NOTE — Progress Notes (Signed)
Recreational Therapy Session Note  Patient Details  Name: [name] [name] MRN: [id number] Date of Birth: [birth year] Today's Date: 07/29/2018 Time:  1305-1400 Pain: "I'm ok. "  Pt premedicated. Offered rest breaks and repositioning throughout           Session  GOAL:  Pt will participate in OOB session w/c level with set up assist/supervision for >30 minutes. ---MET.  Skilled Therapeutic Interventions/Progress Updates: Session focused on activity tolerance, dynamic sitting balance and UE use/strengthening during co-treat with OT.  Utilized the maxi sky to transfer pt from bed to w/c with +2 assist.  Pt performed rolling R & L with min assist and use of bed rail to place sling.  Once in w/c, pt performed w/c mobility ~10' using BUE's with Supervision, extra person to manage IV pole/wound vac.  Transitioned to "zoom ball" w/c level with focus on activity tolerance & BUE strengthening.  Pt performed exercise with lateral and horizontal movements and frequent rest breaks due to cardiorespiratory endurance.    Therapy/Group: Co-Treatment   [name] 07/29/2018, 3:20 PM

## 2018-07-29 NOTE — Progress Notes (Signed)
Occupational Therapy Session Note  Patient Details  Name: [name] [name] MRN: [id number] Date of Birth: 11-Oct-1956  Today's Date: 07/29/2018 OT Individual Time: 1300-1359 OT Individual Time Calculation (min): 59 min    Short Term Goals: Week 3:  OT Short Term Goal 1 (Week 3): Pt will complete BSC transfer with LRAD and 2 helpers  OT Short Term Goal 2 (Week 3): Pt will complete 1 grooming task in supported standing to improve standing tolerance  OT Short Term Goal 3 (Week 3): Pt will thread 1 LE into pants using reacher with Min A  Skilled Therapeutic Interventions/Progress Updates:    Upon entering the room, pt supine in bed awaiting OT arrival and agreeable to session. Recreational therapist present as well. Pt rolling L <> R with mod A to place maxi sky sling. Max sky with +2 assistance to transfer pt into wheelchair. Pt propelled himself 68' in wheelchair towards day room with supervision and OT assisted pt the rest of the way secondary to fatigue. Pt engaged in zoom ball for B UE strengthening and endurance. Pt needing multiple rest breaks secondary to fatigue but enjoyed task. OT assisted pt back to room at end of session where he remained in wheelchair with call bell and all needed items within reach upon exiting the room.   Therapy Documentation Precautions:  Precautions Precautions: Fall Precaution Booklet Issued: No Precaution Comments: wound VAC; Restrictions Weight Bearing Restrictions: No General: General OT Amount of Missed Time: 20 Minutes Vital Signs: Therapy Vitals Temp: 98.2 F (36.8 C) Pulse Rate: 100 Resp: 18 BP: (!) 110/47 Patient Position (if appropriate): Lying Oxygen Therapy SpO2: 99 % O2 Device: Room Air   Therapy/Group: Individual Therapy  Alen Bleacher 07/29/2018, 4:22 PM

## 2018-07-29 NOTE — Consult Note (Signed)
WOC Nurse wound consult note Reason for Consult: Vac dressing change to middle back wound; Dr Otelia Sergeant at the bedside to assess wound appearance. Chronic full thickness wound to middle back, beefy red Drainage (amount, consistency, odor)Mod amt pink drainage in the cannister. Pt denies need for pain meds. Applied one piece black foam,  then bridged the track pad to his rightflank. Cont suction on at 100 mm.  WOC team will plan to change dressing Q M/W/F at 12:00, as requested by patient, so he is not tired during morning therapy. Cammie Mcgee MSN, RN, CWOCN, CWCN-AP, CNS

## 2018-07-29 NOTE — Progress Notes (Signed)
[facility] PHYSICAL MEDICINE & REHABILITATION PROGRESS NOTE   Subjective/Complaints:  Pt states MS contin helpful. Still slow goings with therapy  ROS: Patient denies fever, rash, sore throat, blurred vision, nausea, vomiting, diarrhea, cough, shortness of breath or chest pain,   headache, or mood change.    Objective:   No results found. Recent Labs    07/27/18 0434  WBC 9.7  HGB 7.5*  HCT 25.6*  PLT 434*   Recent Labs    07/27/18 0434  NA 138  K 3.5  CL 104  CO2 28  GLUCOSE 102*  BUN 7  CREATININE 0.67  CALCIUM 8.1*    Intake/Output Summary (Last 24 hours) at 07/29/2018 0908 Last data filed at 07/29/2018 0807 Gross per 24 hour  Intake 360 ml  Output 1230 ml  Net -870 ml     Physical Exam: Vital Signs Blood pressure 129/66, pulse 78, temperature 98.4 F (36.9 C), temperature source Oral, resp. rate 17, height 5\' 10"  (1.778 m), weight (!) 189.6 kg, SpO2 100 %.  Constitutional: No distress . Vital signs reviewed. HEENT: EOMI, oral membranes moist Neck: supple Cardiovascular: RRR without murmur. No JVD    Respiratory: CTA Bilaterally without wheezes or rales. Normal effort    GI: BS +, non-tender, non-distended  Musc: LBP, left shoulder ROM improved. Neurological: He isalertand oriented  Motor:  5/5 in the right deltoid, bicep, tricep, grip 2 to 2+/5 right HE, KE, ADF stable 2-/5 LLE stable Skin:vac on back Psych:pleasant and cooperative.   Assessment/Plan: 1. Functional deficits secondary to thoracic SCI, septic arthritis which require 3+ hours per day of interdisciplinary therapy in a comprehensive inpatient rehab setting.  Physiatrist is providing close team supervision and 24 hour management of active medical problems listed below.  Physiatrist and rehab team continue to assess barriers to discharge/monitor patient progress toward functional and medical goals  Care Tool:  Bathing    Body parts bathed by patient: Left arm, Chest, Abdomen,  Face   Body parts bathed by helper: Front perineal area, Buttocks, Right arm, Right upper leg, Left upper leg, Right lower leg, Left lower leg     Bathing assist Assist Level: Maximal Assistance - Patient 24 - 49%     Upper Body Dressing/Undressing Upper body dressing Upper body dressing/undressing activity did not occur (including orthotics): Safety/medical concerns What is the patient wearing?: Hospital gown only    Upper body assist Assist Level: Set up assist    Lower Body Dressing/Undressing Lower body dressing      What is the patient wearing?: Pants     Lower body assist Assist for lower body dressing: 2 Helpers     Toileting Toileting Toileting Activity did not occur (Clothing management and hygiene only): N/A (no void or bm)  Toileting assist Assist for toileting: 2 Helpers     Transfers Chair/bed transfer  Transfers assist     Chair/bed transfer assist level: Dependent - mechanical lift     Locomotion Ambulation   Ambulation assist   Ambulation activity did not occur: Safety/medical concerns          Walk 10 feet activity   Assist           Walk 50 feet activity   Assist           Walk 150 feet activity   Assist           Walk 10 feet on uneven surface  activity   Assist  Wheelchair     Assist Will patient use wheelchair at discharge?: Yes Type of Wheelchair: Manual Wheelchair activity did not occur: Safety/medical concerns  Wheelchair assist level: Supervision/Verbal cueing Max wheelchair distance: 150'    Wheelchair 50 feet with 2 turns activity    Assist    Wheelchair 50 feet with 2 turns activity did not occur: Safety/medical concerns   Assist Level: Supervision/Verbal cueing   Wheelchair 150 feet activity     Assist Wheelchair 150 feet activity did not occur: Safety/medical concerns   Assist Level: Supervision/Verbal cueing    Medical Problem List and Plan: 1.Decreased  functional mobilitysecondary to left C5 radiculopathy,  thoracic cord contusion with myelopathy/chronic radiculopathy-status post lumbar wound debridement drainage irrigation 9/17/2019with wound VAC placement, septic arthritis left knee MSSA bacteremia             -motivated but slow progress. ?SNF 2. DVT Prophylaxis/Anticoagulation: Subcutaneous Lovenox.             -dopplers negative 3. Pain Management:Robaxin and hydrocodone as needed-   -  hydrocodone   7.5mg  q4 prn               -ACE wrap left knee for comfort as needed  -continue ms contin 15mg  q12 to better control pain 4. Mood:Cymbalta 60 mg daily. Provide emotional support 5. Neuropsych: This patientiscapable of making decisions on hisown behalf. 6. Skin/Wound Care: wound closing             -WOC RN assisting with vac--  -ortho following 7. Fluids/Electrolytes/Nutrition: push po            8.ID/MSSA bacteremia. IV Ancef 2 g every 8 hours through 08/11/2018 and stoped. Follow-up infectious disease.  9.Septic arthritis left knee. Status post arthroscopy irrigation and drainage, no swelling or pain with range of motion 10.Left shoulder full-thickness retracted supraspinatus tendon tear. Conservative care             - improvement in left shoulder pain s/p steroid injection 10/3   11.Acute on chronic anemia. Normocytic  -Continue ferrous gluconate  -Hemoglobin 8.0 on 10/5--> 7.9 --> 7.3 10/9--> 7.6 10/11  ---> 7.5 10/14----follow up again on Thursday             -stool OB negative x2             -continue serial CBC's  -  Consider H/O consult if further drop  12.BPH/urinary retention.  Flomax 0.4 mg daily, I/O cath prn 13.Constipation. stool still "mushy" per staff--held senokot-s  -hold suppository PRN             -likely antibiotic effect             -Continue probiotic, add fiber 13.  HTN       Vitals:   07/28/18 2040 07/29/18 0515  BP: 134/72 129/66  Pulse: 93 78  Resp: 18 17  Temp: 98.9  F (37.2 C) 98.4 F (36.9 C)  SpO2: 100% 100%  Adequate control 10/16  -continue norvasc 5mg  daily   14.  Hypokalemia             Potassium 3.5 10/14                    LOS: 20 days A FACE TO FACE EVALUATION WAS PERFORMED  Ranelle Oyster 07/29/2018, 9:08 AM

## 2018-07-30 ENCOUNTER — Inpatient Hospital Stay (HOSPITAL_COMMUNITY): Payer: No Typology Code available for payment source | Admitting: Physical Therapy

## 2018-07-30 ENCOUNTER — Inpatient Hospital Stay (HOSPITAL_COMMUNITY): Payer: No Typology Code available for payment source | Admitting: Occupational Therapy

## 2018-07-30 NOTE — Progress Notes (Signed)
Occupational Therapy Session Note  Patient Details  Name: [name] [name] MRN: [id number] Date of Birth: 1956/01/21  Today's Date: 07/30/2018 OT Individual Time: 1400-1430 OT Individual Time Calculation (min): 30 min    Short Term Goals: Week 3:  OT Short Term Goal 1 (Week 3): Pt will complete BSC transfer with LRAD and 2 helpers  OT Short Term Goal 2 (Week 3): Pt will complete 1 grooming task in supported standing to improve standing tolerance  OT Short Term Goal 3 (Week 3): Pt will thread 1 LE into pants using reacher with Min A  Skilled Therapeutic Interventions/Progress Updates:    Pt seen for OT session focusing on bed mobility in prep for transfer OOB and LE there-ex. Pt in supine upon arrival with wife present, pt agreeable to tx session. Voiced "chronic" pain in back, declining intervention.  He completed bed mobility with min A to place LE to assist with rolling and use of hospital bed functions in order to have maxi-sky sling placed and transferred to w/c. With increased time and min A for R, pt able to remove B shoes with use of reacher. Completed LE there-ex with use of slick socks and maxi-slide. Extensive education provided regarding importance of quality vs quantity of exercises. VCs and demonstration provided for exercises and proper form/technique. Rest breaks required throughout. Pt requesting to stay in this position and cont exercises from w/c level. Pt's wife present to provide supervision and instructed to don shoes when pt finished.   Therapy Documentation Precautions:  Precautions Precautions: Fall Precaution Booklet Issued: No Precaution Comments: wound VAC; Restrictions Weight Bearing Restrictions: No   Therapy/Group: Individual Therapy  Jorgina Binning L 07/30/2018, 7:27 AM

## 2018-07-30 NOTE — Progress Notes (Signed)
[place] PHYSICAL MEDICINE & REHABILITATION PROGRESS NOTE   Subjective/Complaints:  Had a good day with therapy due to better pain control. Was able to stand, transferred more easily  ROS: Patient denies fever, rash, sore throat, blurred vision, nausea, vomiting, diarrhea, cough, shortness of breath or chest pain, joint or back pain, headache, or mood change.    Objective:   No results found. No results for input(s): WBC, HGB, HCT, PLT in the last 72 hours. No results for input(s): NA, K, CL, CO2, GLUCOSE, BUN, CREATININE, CALCIUM in the last 72 hours.  Intake/Output Summary (Last 24 hours) at 07/30/2018 0926 Last data filed at 07/30/2018 0000 Gross per 24 hour  Intake 180 ml  Output 500 ml  Net -320 ml     Physical Exam: Vital Signs Blood pressure 118/72, pulse 81, temperature 99.7 F (37.6 C), temperature source Oral, resp. rate 19, height 5\' 10"  (1.778 m), weight (!) 189.6 kg, SpO2 100 %.  Constitutional: No distress . Vital signs reviewed. HEENT: EOMI, oral membranes moist Neck: supple Cardiovascular: RRR without murmur. No JVD    Respiratory: CTA Bilaterally without wheezes or rales. Normal effort    GI: BS +, non-tender, non-distended  Musc: LBP, left shoulder ROM improved. Neurological: He isalertand oriented  Motor:  5/5 in the right deltoid, bicep, tricep, grip 2 to 2+/5 right HE, KE, ADF stable 2-/5 LLE stable Skin:vac on back Psych:pleasant and cooperative.   Assessment/Plan: 1. Functional deficits secondary to thoracic SCI, septic arthritis which require 3+ hours per day of interdisciplinary therapy in a comprehensive inpatient rehab setting.  Physiatrist is providing close team supervision and 24 hour management of active medical problems listed below.  Physiatrist and rehab team continue to assess barriers to discharge/monitor patient progress toward functional and medical goals  Care Tool:  Bathing    Body parts bathed by patient: Left arm,  Chest, Abdomen, Face   Body parts bathed by helper: Front perineal area, Buttocks, Right arm, Right upper leg, Left upper leg, Right lower leg, Left lower leg     Bathing assist Assist Level: Maximal Assistance - Patient 24 - 49%     Upper Body Dressing/Undressing Upper body dressing Upper body dressing/undressing activity did not occur (including orthotics): Safety/medical concerns What is the patient wearing?: Hospital gown only    Upper body assist Assist Level: Set up assist    Lower Body Dressing/Undressing Lower body dressing      What is the patient wearing?: Pants     Lower body assist Assist for lower body dressing: 2 Helpers     Toileting Toileting Toileting Activity did not occur (Clothing management and hygiene only): N/A (no void or bm)  Toileting assist Assist for toileting: Dependent - Patient 0%     Transfers Chair/bed transfer  Transfers assist     Chair/bed transfer assist level: Dependent - mechanical lift     Locomotion Ambulation   Ambulation assist   Ambulation activity did not occur: Safety/medical concerns          Walk 10 feet activity   Assist           Walk 50 feet activity   Assist           Walk 150 feet activity   Assist           Walk 10 feet on uneven surface  activity   Assist           Wheelchair     Assist Will patient  use wheelchair at discharge?: Yes Type of Wheelchair: Manual Wheelchair activity did not occur: Safety/medical concerns  Wheelchair assist level: Supervision/Verbal cueing Max wheelchair distance: 65'    Wheelchair 50 feet with 2 turns activity    Assist    Wheelchair 50 feet with 2 turns activity did not occur: Safety/medical concerns   Assist Level: Supervision/Verbal cueing   Wheelchair 150 feet activity     Assist Wheelchair 150 feet activity did not occur: Safety/medical concerns   Assist Level: Supervision/Verbal cueing    Medical Problem  List and Plan: 1.Decreased functional mobilitysecondary to left C5 radiculopathy,  thoracic cord contusion with myelopathy/chronic radiculopathy-status post lumbar wound debridement drainage irrigation 9/17/2019with wound VAC placement, septic arthritis left knee MSSA bacteremia             -motivated but slow progress.    -seeking SNF 2. DVT Prophylaxis/Anticoagulation: Subcutaneous Lovenox.             -dopplers negative 3. Pain Management:Robaxin and hydrocodone as needed-   -  hydrocodone   7.5mg  q4 prn               -ACE wrap left knee for comfort as needed  -continue ms contin 15mg  q12 --has helped 4. Mood:Cymbalta 60 mg daily. Provide emotional support 5. Neuropsych: This patientiscapable of making decisions on hisown behalf. 6. Skin/Wound Care: wound closing             -WOC RN assisting with vac--  -ortho following 7. Fluids/Electrolytes/Nutrition: push po            8.ID/MSSA bacteremia. IV Ancef 2 g every 8 hours through 08/11/2018 and stoped. Follow-up infectious disease.  9.Septic arthritis left knee. Status post arthroscopy irrigation and drainage, no swelling or pain with range of motion 10.Left shoulder full-thickness retracted supraspinatus tendon tear. Conservative care             - improvement in left shoulder pain s/p steroid injection 10/3   11.Acute on chronic anemia. Normocytic  -Continue ferrous gluconate  -Hemoglobin 8.0 on 10/5--> 7.9 --> 7.3 10/9--> 7.6 10/11  ---> 7.5 10/14----follow up labs ordered for Friday             -stool OB negative x2             -continue serial CBC's  -  Consider H/O consult if further drop  12.BPH/urinary retention.  Flomax 0.4 mg daily, I/O cath prn 13.Constipation. stool remains "mushy" per staff--held senokot-s  -hold suppository PRN             -likely antibiotic effect             -Continue probiotic, added fiber 13.  HTN       Vitals:   07/29/18 1926 07/30/18 0424  BP: 103/65 118/72   Pulse: 96 81  Resp: 18 19  Temp: 99.7 F (37.6 C)   SpO2: 96% 100%  Adequate control 10/17  -continue norvasc 5mg  daily   14.  Hypokalemia             Potassium 3.5 10/14                    LOS: 21 days A FACE TO FACE EVALUATION WAS PERFORMED  Ranelle Oyster 07/30/2018, 9:26 AM

## 2018-07-30 NOTE — Progress Notes (Addendum)
Physical Therapy Session Note  Patient Details  Name: [name] [name] MRN: [id number] Date of Birth: 1956/01/25  Today's Date: 07/30/2018 PT Individual Time: 1100-1130 and 1520-1550 PT Individual Time Calculation (min): 30 min and 30 min   Short Term Goals: Week 3:  PT Short Term Goal 1 (Week 3): pt will perform functional transfers with max A of 1 PT Short Term Goal 2 (Week 3): pt will perform rolling in bed with min A  Skilled Therapeutic Interventions/Progress Updates:    pt seated in chair, c/o back pain but agreeable to therapy.  Pt performs sliding board transfer blocked practice with mod A of 2 with facilitation for forward wt shift, assist for lateral scooting. Pt able to scoot forward/backward in w/c and on mat with min A.  Pt with wound vac leakage noted, RN requests pt back to bed. Pt back to bed with maxi sky.  Min A with rolling to remove sling.  Mod A for scooting up in bed. Pt left in bed with needs at hand  Session 2:  Pt in w/c, no c/o pain, reports he has been incontinent of bowel.  Pt transferred to bed with maxi sky, performs rolling in bed with min A, mod A to scoot to head of bed. Pt left in bed with needs at hand, family present.  Therapy Documentation Precautions:  Precautions Precautions: Fall Precaution Booklet Issued: No Precaution Comments: wound VAC; Restrictions Weight Bearing Restrictions: No General: PT Amount of Missed Time (min): 30 Minutes PT Missed Treatment Reason: Nursing care;Wound care Pain:  pt c/o back pain, hot pack applied after session   Therapy/Group: Individual Therapy  Katlynne Mckercher 07/30/2018, 12:20 PM

## 2018-07-30 NOTE — Progress Notes (Signed)
Occupational Therapy Session Note  Patient Details  Name: [name] [name] MRN: [id number] Date of Birth: Dec 04, 1955  Today's Date: 07/30/2018 OT Individual Time: 7829-5621 OT Individual Time Calculation (min): 74 min    Short Term Goals: Week 3:  OT Short Term Goal 1 (Week 3): Pt will complete BSC transfer with LRAD and 2 helpers  OT Short Term Goal 2 (Week 3): Pt will complete 1 grooming task in supported standing to improve standing tolerance  OT Short Term Goal 3 (Week 3): Pt will thread 1 LE into pants using reacher with Min A  Skilled Therapeutic Interventions/Progress Updates:    Upon entering the room, pt supine in bed with no c/o pain this session. Pt utilized reacher to thread pants onto B LEs. Pt rolling with mod A and use of bed rails and then discovered pt had been incontinent of BM. Pt was unaware. Total A for hygiene and for clothing management. Pt transferred into wheelchair with use of maxisky. Pt propelled self to sink with supervision and increased time for grooming tasks at mod I level. Pt engaged in B UE strengthening with use of level 1 resistive theraband and min verbal cuing for proper technique. Pt taking rest breaks as needed after reps of 10. Pt reporting increase in back pain and k-pad placed for pain management. Call bell and all needed items within reach.   Therapy Documentation Precautions:  Precautions Precautions: Fall Precaution Booklet Issued: No Precaution Comments: wound VAC; Restrictions Weight Bearing Restrictions: No   Therapy/Group: Individual Therapy  Alen Bleacher 07/30/2018, 12:55 PM

## 2018-07-31 ENCOUNTER — Inpatient Hospital Stay (HOSPITAL_COMMUNITY): Payer: No Typology Code available for payment source

## 2018-07-31 ENCOUNTER — Telehealth (INDEPENDENT_AMBULATORY_CARE_PROVIDER_SITE_OTHER): Payer: Self-pay

## 2018-07-31 ENCOUNTER — Inpatient Hospital Stay (HOSPITAL_COMMUNITY): Payer: No Typology Code available for payment source | Admitting: Physical Therapy

## 2018-07-31 LAB — HEMOGLOBIN AND HEMATOCRIT, BLOOD
HCT: 24.7 % — ABNORMAL LOW (ref 39.0–52.0)
Hemoglobin: 7.5 g/dL — ABNORMAL LOW (ref 13.0–17.0)

## 2018-07-31 NOTE — Progress Notes (Addendum)
Occupational Therapy Session Note  Patient Details  Name: [name] [name] MRN: [id number] Date of Birth: December 15, 1955  Today's Date: 07/31/2018 OT Individual Time: 4709-6283 OT Individual Time Calculation (min): 45 min    Short Term Goals: Week 3:  OT Short Term Goal 1 (Week 3): Pt will complete BSC transfer with LRAD and 2 helpers  OT Short Term Goal 2 (Week 3): Pt will complete 1 grooming task in supported standing to improve standing tolerance  OT Short Term Goal 3 (Week 3): Pt will thread 1 LE into pants using reacher with Min A  Skilled Therapeutic Interventions/Progress Updates:    Session 1: 1;1. Upon entering room pt in maxi sky with NT transfering back to bed. Pt declines OT tx d/t 10/10 back and knee pain. Pt reporting just receiving pain medication from RN and willing to have OT check back in after 30 min to see if pain has decreased to tolerable level.   Upon checking back with pt, pt willing to work with OT. Pain level reduced to 6/10 in back and knee. Pt completes 4x30 beach ball volleys with dowel rod to hit ball back to OT for improved BUE endurance and strengthening required for BADLs.  Pt completes bed level BLE exercise with maxi slide to decrease friction and improve BLE ROM and strengthening as follows 2x10-15 with min A overall for full ROM: Hip ab/adduction Hip flexion/extension Knee flexion/extension Hip internal/external rotation  Gastroc stretch 30 seconds for B heel cord stretch  After LE exercises pt requesting to rest prior to wound vac RN coming to change bandage missing 15 more min of Skilled OT. Exited session with pt seated in bed, call light in reach and all needs met.    Therapy Documentation Precautions:  Precautions Precautions: Fall Precaution Booklet Issued: No Precaution Comments: wound VAC; Restrictions Weight Bearing Restrictions: No General:   Vital Signs:  Pain: Pain Assessment Pain Scale: 0-10 Pain Score: 10-Worst pain ever Pain  Type: Surgical pain Pain Location: Back Pain Orientation: Lower Pain Descriptors / Indicators: Aching;Constant Pain Frequency: Constant Pain Onset: On-going Patients Stated Pain Goal: 2 Pain Intervention(s): Medication (See eMAR) Multiple Pain Sites: No ADL:    Therapy/Group: Individual Therapy  Tonny Branch 07/31/2018, 10:37 AM

## 2018-07-31 NOTE — Progress Notes (Signed)
Physical Therapy Session Note  Patient Details  Name: [name] [name] MRN: [id number] Date of Birth: 09-Dec-1955  Today's Date: 07/31/2018 PT Individual Time: 9562-1308 PT Individual Time Calculation (min): 53 min   Short Term Goals: Week 4:  PT Short Term Goal 1 (Week 4): = LTG  Skilled Therapeutic Interventions/Progress Updates:  Pt in bed agreeable to therapy.  Supine to sit with max A, cues for technique.  Pt performs sit <> stand with sara x 3 for LE weightbearing and strengthening. Pt requires assistance for knee placement in sara and cues for hip and trunk extension. Pt's standing tolerance limited by pain this session, < 30 seconds per stand.  Pt performs sitting balance edge of bed for zoom ball task 2 x 3 minutes with supervision.  Pt transferred with maxi sky to recliner chair. Pt performs seated AAROM to bilat LEs with improved hip and knee strength noted bilat.  Pt left in chair with needs at hand.  Therapy Documentation Precautions:  Precautions Precautions: Fall Precaution Booklet Issued: No Precaution Comments: wound VAC; Restrictions Weight Bearing Restrictions: No Pain: Pt with c/o back pain when standing this session, eases with rest   Therapy/Group: Individual Therapy  Marjoria Mancillas 07/31/2018, 9:49 AM

## 2018-07-31 NOTE — Progress Notes (Signed)
Physical Therapy Weekly Progress Note  Patient Details  Name: Devontre Siedschlag MRN: [id number] Date of Birth: 1955/11/03  Beginning of progress report period: July 24, 2018 End of progress report period: July 31, 2018   Patient has met 1 of 2 short term goals.  Pt has been limited by pain and bowel incontinence limiting progress with therapy. Discharge plan changed to SNF as pt continues to require +2 assist for out of bed mobility.  Patient continues to demonstrate the following deficits muscle weakness and decreased postural control and therefore will continue to benefit from skilled PT intervention to increase functional independence with mobility.  Patient not progressing toward long term goals.  See goal revision..  Plan of care revisions: discharge changed to SNF.  PT Short Term Goals Week 3:  PT Short Term Goal 1 (Week 3): pt will perform functional transfers with max A of 1 PT Short Term Goal 1 - Progress (Week 3): Progressing toward goal PT Short Term Goal 2 (Week 3): pt will perform rolling in bed with min A PT Short Term Goal 2 - Progress (Week 3): Met Week 4:  PT Short Term Goal 1 (Week 4): = LTG  Skilled Therapeutic Interventions/Progress Updates:  Ambulation/gait training;Balance/vestibular training;Community reintegration;Discharge planning;DME/adaptive equipment instruction;Functional electrical stimulation;Functional mobility training;Neuromuscular re-education;Patient/family education;Skin care/wound management;Stair training;Therapeutic Activities;Therapeutic Exercise;UE/LE Strength taining/ROM;UE/LE Coordination activities;Wheelchair propulsion/positioning;Pain management;Splinting/orthotics     Kian Ottaviano 07/31/2018, 8:19 AM

## 2018-07-31 NOTE — Telephone Encounter (Signed)
Trude Mcburney returned your call stating that she doesn't need anything for the patient.

## 2018-07-31 NOTE — Consult Note (Signed)
WOC Nurse wound consult note Reason for Consult:Vac dressing change to middle back wound Chronic full thickness wound to middle back, beefy red Drainage (amount, consistency, odor)Mod amtpinkdrainage in the cannister. Pt denies need for pain meds. Applied one piece black foam,then bridged the track pad to his rightflank. Cont suction on at 100 mm.  WOC team will plan to change dressing Q M/W/F at 12:00, as requested by patient, so he is not tired during morning therapy. Cammie Mcgee MSN, RN, CWOCN, CWCN-AP, CNS

## 2018-07-31 NOTE — Plan of Care (Signed)
  Problem: Consults Goal: RH SPINAL CORD INJURY PATIENT EDUCATION Description  See Patient Education module for education specifics.  Outcome: Progressing   Problem: SCI BOWEL ELIMINATION Goal: RH STG MANAGE BOWEL WITH ASSISTANCE Description STG Manage Bowel with Mod Assistance.  Outcome: Progressing Goal: RH STG SCI MANAGE BOWEL PROGRAM W/ASSIST OR AS APPROPRIATE Description STG SCI Manage bowel program w/ Mod assist or as appropriate.  Outcome: Progressing   Problem: SCI BLADDER ELIMINATION Goal: RH STG MANAGE BLADDER WITH ASSISTANCE Description STG Manage Bladder With Mod Assistance  Outcome: Progressing Goal: RH STG MANAGE BLADDER WITH EQUIPMENT WITH ASSISTANCE Description STG Manage Bladder With Equipment With Mod Assistance  Outcome: Progressing   Problem: RH SKIN INTEGRITY Goal: RH STG SKIN FREE OF INFECTION/BREAKDOWN Description No new breakdown with min assist   Outcome: Progressing Goal: RH STG ABLE TO PERFORM INCISION/WOUND CARE W/ASSISTANCE Description STG Able To Perform Incision/Wound Care With Max Assistance.  Outcome: Progressing   Problem: RH PAIN MANAGEMENT Goal: RH STG PAIN MANAGED AT OR BELOW PT'S PAIN GOAL Description <3 out of 10.   Outcome: Progressing

## 2018-07-31 NOTE — Progress Notes (Signed)
Occupational Therapy Session Note  Patient Details  Name: [name] [name] MRN: [id number] Date of Birth: May 08, 1956  Today's Date: 07/31/2018 OT Individual Time: 8184-0375 OT Individual Time Calculation (min): 58 min    Short Term Goals: Week 1:  OT Short Term Goal 1 (Week 1): Pt will transfer to w/c MAX A of 2 to w/c in prep for BSC transfer OT Short Term Goal 1 - Progress (Week 1): Progressing toward goal OT Short Term Goal 2 (Week 1): Pt will don shirt MOD A OT Short Term Goal 2 - Progress (Week 1): Met OT Short Term Goal 3 (Week 1): Pt will recall hemi dressing techniques for UB dressing with min question cues OT Short Term Goal 3 - Progress (Week 1): Met OT Short Term Goal 4 (Week 1): Pt will groom at sink wiht supervision and AE PRN OT Short Term Goal 4 - Progress (Week 1): Met  Skilled Therapeutic Interventions/Progress Updates:    1:1. Pt agreeable to OT tx with 5/10 back pain that is premedicated prior to session. Pt completes level 1 theraband for BUE strengthening required for BADLs 1x15 as follows in semi reclined position in bed: Shoulder flexion/extension (min A LUE) Horizontal ab/adduction Internal/external rotation Elbow flex/extension Shoulder ab/adduct  Pt transfers into w/c via maxi sky with MIN-MOD A for rolling B to place sling.  Pt completes ball toss seated in w/c (bounce and chest pass) 3x20 passes for BUE endurance and coordination required for UB dressing and ADLs. Exited session with pt seated in w/c, call light in reach, K pad and pillow behind back for comfort and wife present in room with all needs met.    Therapy Documentation Precautions:  Precautions Precautions: Fall Precaution Booklet Issued: No Precaution Comments: wound VAC; Restrictions Weight Bearing Restrictions: No   Therapy/Group: Individual Therapy  Tonny Branch 07/31/2018, 1:12 PM

## 2018-07-31 NOTE — Telephone Encounter (Signed)
I have left another for her to call back, trying to get a understanding of what she is needing.  If hes needs and appt then we will get him scheduled in our office. We can address this further when she calls back.

## 2018-07-31 NOTE — Progress Notes (Signed)
[facility] PHYSICAL MEDICINE & REHABILITATION PROGRESS NOTE   Subjective/Complaints:  Up in bed. Pain remains under better control.  ROS: Patient denies fever, rash, sore throat, blurred vision, nausea, vomiting, diarrhea, cough, shortness of breath or chest pain, joint or back pain, headache, or mood change.   Objective:   No results found. Recent Labs    07/31/18 0416  HGB 7.5*  HCT 24.7*   No results for input(s): NA, K, CL, CO2, GLUCOSE, BUN, CREATININE, CALCIUM in the last 72 hours.  Intake/Output Summary (Last 24 hours) at 07/31/2018 1424 Last data filed at 07/31/2018 0812 Gross per 24 hour  Intake 480 ml  Output 500 ml  Net -20 ml     Physical Exam: Vital Signs Blood pressure (!) 149/73, pulse (!) 107, temperature 98 F (36.7 C), resp. rate 18, height 5\' 10"  (1.778 m), weight (!) 189.6 kg, SpO2 99 %.  Constitutional: No distress . Vital signs reviewed. obese HEENT: EOMI, oral membranes moist Neck: supple Cardiovascular: RRR without murmur. No JVD    Respiratory: CTA Bilaterally without wheezes or rales. Normal effort    GI: BS +, non-tender, non-distended  Musc: LBP, left shoulder ROM improved. Neurological: He isalertand oriented  Motor:  5/5 in the right deltoid, bicep, tricep, grip  2+/5 right HE, KE, ADF stable 2- to 2/5 LLE stable Skin:vac on right low back, multiple scars on trunk. ?new lesions which are broken Psych:pleasant and cooperative.   Assessment/Plan: 1. Functional deficits secondary to thoracic SCI, septic arthritis which require 3+ hours per day of interdisciplinary therapy in a comprehensive inpatient rehab setting.  Physiatrist is providing close team supervision and 24 hour management of active medical problems listed below.  Physiatrist and rehab team continue to assess barriers to discharge/monitor patient progress toward functional and medical goals  Care Tool:  Bathing    Body parts bathed by patient: Left arm, Chest,  Abdomen, Face   Body parts bathed by helper: Front perineal area, Buttocks, Right arm, Right upper leg, Left upper leg, Right lower leg, Left lower leg     Bathing assist Assist Level: Maximal Assistance - Patient 24 - 49%     Upper Body Dressing/Undressing Upper body dressing Upper body dressing/undressing activity did not occur (including orthotics): Safety/medical concerns What is the patient wearing?: Pull over shirt    Upper body assist Assist Level: Supervision/Verbal cueing    Lower Body Dressing/Undressing Lower body dressing      What is the patient wearing?: Pants     Lower body assist Assist for lower body dressing: Moderate Assistance - Patient 50 - 74%     Toileting Toileting Toileting Activity did not occur Press photographer and hygiene only): N/A (no void or bm)  Toileting assist Assist for toileting: Dependent - Patient 0%     Transfers Chair/bed transfer  Transfers assist     Chair/bed transfer assist level: Dependent - mechanical lift     Locomotion Ambulation   Ambulation assist   Ambulation activity did not occur: Safety/medical concerns          Walk 10 feet activity   Assist           Walk 50 feet activity   Assist           Walk 150 feet activity   Assist           Walk 10 feet on uneven surface  activity   Assist           Wheelchair  Assist Will patient use wheelchair at discharge?: Yes Type of Wheelchair: Manual Wheelchair activity did not occur: Safety/medical concerns  Wheelchair assist level: Supervision/Verbal cueing Max wheelchair distance: 33'    Wheelchair 50 feet with 2 turns activity    Assist    Wheelchair 50 feet with 2 turns activity did not occur: Safety/medical concerns   Assist Level: Supervision/Verbal cueing   Wheelchair 150 feet activity     Assist Wheelchair 150 feet activity did not occur: Safety/medical concerns   Assist Level: Supervision/Verbal  cueing    Medical Problem List and Plan: 1.Decreased functional mobilitysecondary to left C5 radiculopathy,  thoracic cord contusion with myelopathy/chronic radiculopathy-status post lumbar wound debridement drainage irrigation 9/17/2019with wound VAC placement, septic arthritis left knee MSSA bacteremia             - seeking SNF 2. DVT Prophylaxis/Anticoagulation: Subcutaneous Lovenox.             -dopplers negative 3. Pain Management:Robaxin and hydrocodone as needed-   -  hydrocodone   7.5mg  q4 prn               -continue ms contin 15mg  q12 --has helped 4. Mood:Cymbalta 60 mg daily. Provide emotional support 5. Neuropsych: This patientiscapable of making decisions on hisown behalf. 6. Skin/Wound Care: wound closing             -WOC RN assisting with vac  -ortho following 7. Fluids/Electrolytes/Nutrition: push po            8.ID/MSSA bacteremia. IV Ancef 2 g every 8 hours through 08/11/2018 and stoped. Follow-up infectious disease.  9.Septic arthritis left knee. Status post arthroscopy irrigation and drainage, no swelling or pain with range of motion 10.Left shoulder full-thickness retracted supraspinatus tendon tear. Conservative care             - improvement in left shoulder pain s/p steroid injection 10/3   11.Acute on chronic anemia. Normocytic  -Continue ferrous gluconate  -Hemoglobin holding at 7.5 10/18             -stool OB negative x2             -continue serial CBC's  -  Consider H/O consult if further drop  12.BPH/urinary retention.  Flomax 0.4 mg daily, I/O cath prn 13.Constipation. stool remains "mushy" per staff--held senokot-s  -hold suppository PRN             -likely antibiotic effect             -Continue probiotic, added fiber 13.  HTN       Vitals:   07/31/18 0529 07/31/18 1415  BP: (!) 106/48 (!) 149/73  Pulse: 85 (!) 107  Resp: 18 18  Temp: 97.6 F (36.4 C) 98 F (36.7 C)  SpO2: 96% 99%  Adequate control  10/18  -continue norvasc 5mg  daily   14.  Hypokalemia             Potassium 3.5 10/14                    LOS: 22 days A FACE TO FACE EVALUATION WAS PERFORMED  Ranelle Oyster 07/31/2018, 2:24 PM

## 2018-07-31 NOTE — Progress Notes (Signed)
Occupational Therapy Session Note  Patient Details  Name: [name] [name] MRN: [id number] Date of Birth: 10/18/1955  Today's Date: 07/31/2018 OT Individual Time: 1400-1430 OT Individual Time Calculation (min): 30 min    Short Term Goals: Week 3:  OT Short Term Goal 1 (Week 3): Pt will complete BSC transfer with LRAD and 2 helpers  OT Short Term Goal 2 (Week 3): Pt will complete 1 grooming task in supported standing to improve standing tolerance  OT Short Term Goal 3 (Week 3): Pt will thread 1 LE into pants using reacher with Min A  Skilled Therapeutic Interventions/Progress Updates:    Session focused on B UE strengthening/endurance. Pt used 4lb dumbbell in R hand and 3lb dumbbell in L hand and completed functional reaching. Pt completed 3x 15 gravity eliminated L elbow flex with cueing for technique. Edu provided to pt and wife re accessory muscle use during compensatory L shoulder raises. Pt left sitting up with all needs met and family present. No increased pain reported throughout session from baseline back pain.   Therapy Documentation Precautions:  Precautions Precautions: Fall Precaution Booklet Issued: No Precaution Comments: wound VAC; Restrictions Weight Bearing Restrictions: No General: General OT Amount of Missed Time: 45 Minutes Vital Signs: Therapy Vitals Temp: 98 F (36.7 C) Pulse Rate: (!) 107 Resp: 18 BP: (!) 149/73 Patient Position (if appropriate): Sitting Oxygen Therapy SpO2: 99 % O2 Device: Room Air Pain: Pain Assessment Pain Scale: 0-10 Pain Score: 8  Pain Type: Acute pain Pain Location: Back Pain Orientation: Lower Pain Descriptors / Indicators: Aching Pain Frequency: Constant Pain Onset: On-going Patients Stated Pain Goal: 2 Pain Intervention(s): Emotional support  Therapy/Group: Individual Therapy  Curtis Sites 07/31/2018, 3:02 PM

## 2018-07-31 NOTE — NC FL2 (Addendum)
[place] MEDICAID FL2 LEVEL OF CARE SCREENING TOOL     IDENTIFICATION  Patient Name: [name] [name] Birthdate: 1956-08-03 Sex: male Admission Date (Current Location): 07/09/2018  [facility] and [place] Number:  Producer, television/film/video and Address:  [facility], [address] [place], [place] [address]      Provider Number: [id number]  Attending Physician Name and Address:  Ranelle Oyster, MD  Relative Name and Phone Number:       Current Level of Care: Other (Comment)(Inpatient Rehabilitation) Recommended Level of Care: Skilled Nursing Facility Prior Approval Number:    Date Approved/Denied:   PASRR Number: [id number]  Discharge Plan: SNF    Current Diagnoses: Patient Active Problem List   Diagnosis Date Noted  . Chronic pain syndrome   . Labile blood pressure   . Urinary retention   . Hypokalemia   . Hypertension   . Acute blood loss anemia   . Anemia of chronic disease   . Abscess in epidural space of lumbar spine   . Effusion, left knee   . Pain and swelling of wrist, left   . Complete tear of left rotator cuff 07/03/2018    Class: Chronic  . AKI (acute kidney injury) (HCC)   . MSSA bacteremia   . Septic arthritis of knee, left (HCC) 06/30/2018    Class: Acute  . Leukocytosis   . Postoperative seroma involving nervous system after nervous system procedure   . Shoulder pain, bilateral   . Thoracic myelopathy 06/28/2018  . Spondylosis, thoracic, with myelopathy 06/28/2018    Class: Acute  . Abscess in epidural space of L2-L5 lumbar spine 06/28/2018    Class: Acute  . Acute urinary retention 06/28/2018  . Left cervical radiculopathy 06/28/2018    Class: Acute  . Spinal stenosis of lumbar region 11/17/2017    Class: Chronic  . Status post lumbar laminectomy 11/17/2017  . Lumbar stenosis with neurogenic claudication 08/18/2017  . Forestier's disease of thoracolumbar region 08/18/2017  . Degenerative disc disease, lumbar 08/18/2017   . Lumbar radiculopathy 09/30/2016  . Morbid (severe) obesity due to excess calories (HCC) 09/30/2016  . Biliary calculus with acute cholecystitis 03/21/2016  . Acute pyelonephritis 12/28/2013  . Ureteral stone with hydronephrosis 12/28/2013  . Acute kidney failure (HCC) 12/27/2013  . Severe sepsis with acute organ dysfunction (HCC) 12/27/2013  . Hypotension 12/27/2013  . Lactic acidosis 12/27/2013  . Sepsis secondary to UTI (HCC) 12/27/2013  . EMPYEMA CHEST 08/30/2008  . PRURIGO 08/30/2008  . OBSTRUCTIVE SLEEP APNEA 08/30/2008    Orientation RESPIRATION BLADDER Height & Weight     Self, Time, Situation, Place  Normal Incontinent Weight: 123.5 kg Height:  5\' 10"  (177.8 cm)  BEHAVIORAL SYMPTOMS/MOOD NEUROLOGICAL BOWEL NUTRITION STATUS      Incontinent Diet(regular diet and thin liquids)  AMBULATORY STATUS COMMUNICATION OF NEEDS Skin   Total Care(sara, maxi-sky, lift) Verbally Surgical wounds, Wound Vac to be changed M, W, F.  Settings at 155mm/Hg.                       Personal Care Assistance Level of Assistance  Bathing, Dressing Bathing Assistance: Limited assistance(bed level) Feeding assistance: Independent Dressing Assistance: Limited assistance     Functional Limitations Info  Sight, Hearing, Speech Sight Info: Adequate Hearing Info: Adequate Speech Info: Adequate    SPECIAL CARE FACTORS FREQUENCY  Blood pressure, PT (By licensed PT), OT (By licensed OT), Bowel and bladder program Blood Pressure Frequency: daily  PT Frequency: 5x/week OT Frequency: 5x/week Bowel and Bladder Program Frequency: in and out caths PRN and bowel program          Contractures Contractures Info: Not present    Additional Factors Info  Code Status, Allergies Code Status Info: full Allergies Info: Unasyn (Ampicillin-sulbactam sodium)           Current Medications (07/31/2018):  This is the current hospital active medication list Current Facility-Administered  Medications  Medication Dose Route Frequency Provider Last Rate Last Dose  . 0.9 %  sodium chloride infusion   Intravenous PRN Ranelle Oyster, MD 10 mL/hr at 07/28/18 0039 250 mL at 07/28/18 0039  . acetaminophen (TYLENOL) tablet 650 mg  650 mg Oral Q6H PRN Charlton Amor, PA-C   650 mg at 07/31/18 1420   Or  . acetaminophen (TYLENOL) suppository 650 mg  650 mg Rectal Q6H PRN Angiulli, Mcarthur Rossetti, PA-C      . alum & mag hydroxide-simeth (MAALOX/MYLANTA) 200-200-20 MG/5ML suspension 30 mL  30 mL Oral Q6H PRN Angiulli, Mcarthur Rossetti, PA-C      . amLODipine (NORVASC) tablet 5 mg  5 mg Oral Daily Ranelle Oyster, MD   5 mg at 07/30/18 0743  . bisacodyl (DULCOLAX) EC tablet 5 mg  5 mg Oral Daily PRN Angiulli, Mcarthur Rossetti, PA-C      . bisacodyl (DULCOLAX) suppository 10 mg  10 mg Rectal Q3 days Charlton Amor, PA-C   10 mg at 07/15/18 9562  . calcium-vitamin D (OSCAL WITH D) 500-200 MG-UNIT per tablet 1 tablet  1 tablet Oral Daily Charlton Amor, PA-C   1 tablet at 07/31/18 0830  . ceFAZolin (ANCEF) IVPB 2g/100 mL premix  2 g Intravenous Q8H Faith Rogue T, MD 200 mL/hr at 07/31/18 1419 2 g at 07/31/18 1419  . DULoxetine (CYMBALTA) DR capsule 60 mg  60 mg Oral Daily Charlton Amor, PA-C   60 mg at 07/30/18 0744  . enoxaparin (LOVENOX) injection 30 mg  30 mg Subcutaneous Q24H AngiulliMcarthur Rossetti, PA-C   30 mg at 07/31/18 1308  . feeding supplement (BOOST / RESOURCE BREEZE) liquid 1 Container  1 Container Oral BID BM Ranelle Oyster, MD   1 Container at 07/30/18 1402  . ferrous gluconate (FERGON) tablet 324 mg  324 mg Oral TID WC AngiulliMcarthur Rossetti, PA-C   324 mg at 07/31/18 1722  . HYDROcodone-acetaminophen (NORCO) 7.5-325 MG per tablet 1 tablet  1 tablet Oral Q4H PRN Ranelle Oyster, MD   1 tablet at 07/31/18 1017  . methocarbamol (ROBAXIN) tablet 500 mg  500 mg Oral Q6H PRN Charlton Amor, PA-C   500 mg at 07/31/18 1420   Or  . methocarbamol (ROBAXIN) 500 mg in dextrose 5 % 50 mL  IVPB  500 mg Intravenous Q6H PRN Angiulli, Mcarthur Rossetti, PA-C      . morphine (MS CONTIN) 12 hr tablet 15 mg  15 mg Oral Q12H Ranelle Oyster, MD   15 mg at 07/31/18 2018  . multivitamin with minerals tablet 1 tablet  1 tablet Oral Daily Charlton Amor, PA-C   1 tablet at 07/31/18 0830  . ondansetron (ZOFRAN) tablet 4 mg  4 mg Oral Q6H PRN Angiulli, Mcarthur Rossetti, PA-C       Or  . ondansetron (ZOFRAN) injection 4 mg  4 mg Intravenous Q6H PRN Angiulli, Mcarthur Rossetti, PA-C      . pantoprazole (PROTONIX) EC tablet 40 mg  40  mg Oral BID AC AngiulliMcarthur Rossetti, PA-C   40 mg at 07/31/18 1722  . polycarbophil (FIBERCON) tablet 625 mg  625 mg Oral Daily Ranelle Oyster, MD   625 mg at 07/31/18 0829  . potassium chloride SA (K-DUR,KLOR-CON) CR tablet 20 mEq  20 mEq Oral BID Ranelle Oyster, MD   20 mEq at 07/31/18 2018  . saccharomyces boulardii (FLORASTOR) capsule 250 mg  250 mg Oral BID Ranelle Oyster, MD   250 mg at 07/31/18 2018  . sodium chloride flush (NS) 0.9 % injection 10-40 mL  10-40 mL Intracatheter PRN Ranelle Oyster, MD   10 mL at 07/30/18 0058  . sorbitol 70 % solution 30 mL  30 mL Oral Daily PRN Angiulli, Mcarthur Rossetti, PA-C      . tamsulosin [facility]) capsule 0.4 mg  0.4 mg Oral Daily Charlton Amor, PA-C   0.4 mg at 07/31/18 0830  . vitamin B-12 (CYANOCOBALAMIN) tablet 1,000 mcg  1,000 mcg Oral Daily Charlton Amor, PA-C   1,000 mcg at 07/31/18 0830     Discharge Medications: Please see discharge summary for a list of discharge medications.  Relevant Imaging Results:  Relevant Lab Results:   Additional Information SSN: [id number]; IV Ancef 2 grams q 8 hours until 08-11-18  Brigido Mera, [place], LCSW

## 2018-08-01 ENCOUNTER — Inpatient Hospital Stay (HOSPITAL_COMMUNITY): Payer: No Typology Code available for payment source | Admitting: *Deleted

## 2018-08-01 NOTE — Progress Notes (Signed)
[name] [name] is a 62 y.o. male 1956/01/22 [id number]  Subjective: Feeling ok. Reports loose stool (and has declined bowel regimen as ordered for weeks). No pain or other concerns.  Objective: Vital signs in last 24 hours: Temp:  [97.4 F (36.3 C)-98.8 F (37.1 C)] 98.1 F (36.7 C) (10/19 0649) Pulse Rate:  [95-107] 95 (10/19 0459) Resp:  [18-20] 20 (10/19 0459) BP: (117-149)/(71-78) 132/78 (10/19 0459) SpO2:  [99 %-100 %] 100 % (10/19 0459) Weight change:  Last BM Date: 07/30/18  Intake/Output from previous day: 10/18 0701 - 10/19 0700 In: 500 [P.O.:360; I.V.:40; IV Piggyback:100] Out: 750 [Urine:650; Drains:100]  Physical Exam General: No apparent distress, pleasant.   Supine flat on air mattress, MO Lungs: Normal effort. Lungs clear to auscultation anteriorly, no crackles or wheezes. Cardiovascular: Regular rate and rhythm Neurological: No new neurological deficits   Lab Results: BMET    Component Value Date/Time   NA 138 07/27/2018 0434   K 3.5 07/27/2018 0434   CL 104 07/27/2018 0434   CO2 28 07/27/2018 0434   GLUCOSE 102 (H) 07/27/2018 0434   BUN 7 07/27/2018 0434   CREATININE 0.67 07/27/2018 0434   CREATININE 0.86 03/26/2013 0907   CALCIUM 8.1 (L) 07/27/2018 0434   GFRNONAA >60 07/27/2018 0434   GFRAA >60 07/27/2018 0434   CBC    Component Value Date/Time   WBC 9.7 07/27/2018 0434   RBC 2.92 (L) 07/27/2018 0434   HGB 7.5 (L) 07/31/2018 0416   HCT 24.7 (L) 07/31/2018 0416   PLT 434 (H) 07/27/2018 0434   MCV 87.7 07/27/2018 0434   MCH 25.7 (L) 07/27/2018 0434   MCHC 29.3 (L) 07/27/2018 0434   RDW 15.7 (H) 07/27/2018 0434   LYMPHSABS 0.8 07/14/2018 0832   MONOABS 0.9 07/14/2018 0832   EOSABS 0.1 07/14/2018 0832   BASOSABS 0.0 07/14/2018 0832   CBG's (last 3):  No results for input(s): GLUCAP in the last 72 hours. LFT's Lab Results  Component Value Date   ALT 10 07/10/2018   AST 27 07/10/2018   ALKPHOS 68 07/10/2018   BILITOT 0.6 07/10/2018     Studies/Results: No results found.  Medications:  I have reviewed the patient's current medications. Scheduled Medications: . amLODipine  5 mg Oral Daily  . bisacodyl  10 mg Rectal Q3 days  . calcium-vitamin D  1 tablet Oral Daily  . DULoxetine  60 mg Oral Daily  . enoxaparin (LOVENOX) injection  30 mg Subcutaneous Q24H  . feeding supplement  1 Container Oral BID BM  . ferrous gluconate  324 mg Oral TID WC  . morphine  15 mg Oral Q12H  . multivitamin with minerals  1 tablet Oral Daily  . pantoprazole  40 mg Oral BID AC  . polycarbophil  625 mg Oral Daily  . potassium chloride  20 mEq Oral BID  . saccharomyces boulardii  250 mg Oral BID  . tamsulosin  0.4 mg Oral Daily  . vitamin B-12  1,000 mcg Oral Daily   PRN Medications: sodium chloride, acetaminophen **OR** acetaminophen, alum & mag hydroxide-simeth, bisacodyl, HYDROcodone-acetaminophen, methocarbamol **OR** methocarbamol (ROBAXIN) IV, ondansetron **OR** ondansetron (ZOFRAN) IV, sodium chloride flush, sorbitol  Assessment/Plan: Principal Problem:   Thoracic myelopathy Active Problems:   Forestier's disease of thoracolumbar region   Degenerative disc disease, lumbar   Abscess in epidural space of L2-L5 lumbar spine   Left cervical radiculopathy   Complete tear of left rotator cuff   MSSA bacteremia   Effusion, left knee  Hypokalemia   Hypertension   Acute blood loss anemia   Anemia of chronic disease   Labile blood pressure   Urinary retention   Chronic pain syndrome   1.  Prolonged debility with thoracic myelopathy related to spinal cord infection and postoperative complications.  See below for septic and infectious disease issues.  Complicated orthopedic situation with shoulder pathology and neuro deficits complicated by infection and morbid obesity.  Continue medical management of various issues as prescribed.  Awaiting skilled nursing facility placement. 2.  MSSA bacteremia.  On IV Ancef through October 29  as per ID related to left septic arthritis and spinal issues. 3.  Persistent loose bowels despite holding medication regimen for bowels in setting of neurogenic bladder.  Suspect aggravated by antibiotics but remains afebrile.  Continue probiotic and fiber as prescribed. 4.  Urinary retention due to neurogenic issues.  Continue I/O cath with Flomax as prescribed 5.  Anemia, chronic without evidence of acute blood loss.  Continue iron supplement and recheck CBC as needed  Length of stay, days: 23  Valerie A. Felicity Coyer, MD 08/01/2018, 11:30 AM

## 2018-08-01 NOTE — Progress Notes (Signed)
Occupational Therapy Session Note  Patient Details  Name: [name] [name] MRN: [id number] Date of Birth: 01/28/1956  Today's Date: 08/01/2018 OT Individual Time: 1245-1345 OT Individual Time Calculation (min): 60 min    Skilled Therapeutic Interventions/Progress Updates: Upon approach for therapy, patient stated he thought he'd had an incontient BM episode.   So, this clinician and rehab tech changed brief (smear only).   He was able to complete lateral rolls to ease caregiver burden with cues for technique and Mod assist x2.  He completed lower body bathing with max A (he was able to washsome of his front) staff completed the back portion.  Lower body dressing of brief and shorts with use of reacher (supine in bed with head of bed raised)=maxA  [place] Dec lift out of bed to his recliner.  Mild endurance activities.   He was left with callbell and phone within reach.       Therapy Documentation Precautions:  Precautions Precautions: Fall Precaution Booklet Issued: No Precaution Comments: wound VAC; Restrictions Weight Bearing Restrictions: No  Pain:back intermittent.   RN gave meds     Therapy/Group: Individual Therapy  Bud Face [facility] 08/01/2018, 6:41 PM

## 2018-08-02 ENCOUNTER — Inpatient Hospital Stay (HOSPITAL_COMMUNITY): Payer: No Typology Code available for payment source | Admitting: Physical Therapy

## 2018-08-02 NOTE — Progress Notes (Signed)
Physical Therapy Session Note  Patient Details  Name: [name] [name] MRN: [id number] Date of Birth: [birth year]  Today's Date: 08/02/2018 PT Individual Time: 1100-1200 PT Individual Time Calculation (min): 60 min   Short Term Goals: Week 4:  PT Short Term Goal 1 (Week 4): = LTG  Skilled Therapeutic Interventions/Progress Updates:    Patient received in bed, pleasant and willing to participate in PT today. Able to roll with ModA for donning of pants totalA by PT/tech and for placement of maxisky lift pad. Transferred patient from bed to Nustep using maxisky pad and required MaxA to position comfortably in Nustep, able to then ride Nustep with B UEs/LEs on level 3 for periods of 10 minutes and 5 minutes (15 minutes total) with modA to hold R LE in appropriate position. From there transferred patient from Nustep to [place] and then able to self-propel WC approximately 8f initially with S but increased to MinA due to R shoulder pain with WC mobility. [facility] and used Maxisky to transfer up to recliner. He was left up in the recliner with wife present, all needs otherwise met this morning, quite fatigued at EOS.   Therapy Documentation Precautions:  Precautions Precautions: Fall Precaution Booklet Issued: No Precaution Comments: wound VAC; Restrictions Weight Bearing Restrictions: No General:   Vital Signs:  Pain: Pain Assessment Pain Scale: 0-10 Pain Score: 4  Pain Type: Acute pain Pain Location: Back Pain Orientation: Lower Pain Descriptors / Indicators: Aching Pain Frequency: Constant Pain Onset: On-going Patients Stated Pain Goal: 0 Pain Intervention(s): Repositioned;Ambulation/increased activity    Therapy/Group: Individual Therapy  KDeniece ReePT, DPT, CBIS  Supplemental Physical Therapist [facility]   Pager [phone] Rehab Office [phone]  08/02/2018, 12:19 PM

## 2018-08-02 NOTE — Progress Notes (Signed)
[name] [name] is a 62 y.o. male 12-18-1955 [id number]  Subjective: Slept well, no new concerns - [name] [name] unchanged. Feels RLE is most improved with some motion (compared to 100% "paralysis" before) and LLE also improving  Objective: Vital signs in last 24 hours: Temp:  [97.7 F (36.5 C)-98.1 F (36.7 C)] 97.7 F (36.5 C) (10/20 0307) Pulse Rate:  [85-96] 85 (10/20 0307) Resp:  [16-20] 18 (10/20 0307) BP: (100-129)/(59-70) 100/59 (10/20 0307) SpO2:  [99 %-100 %] 99 % (10/20 0307) Weight change:  Last BM Date: 08/01/18  Intake/Output from previous day: 10/19 0701 - 10/20 0700 In: 760 [P.O.:760] Out: 925 [Urine:925]  Physical Exam General: No apparent distress, pleasant.   Supine flat on air mattress, MO Lungs: Normal effort. Lungs clear to auscultation anteriorly, no crackles or wheezes. Cardiovascular: Regular rate and rhythm Neurological: No new neurological deficits   Lab Results: BMET    Component Value Date/Time   NA 138 07/27/2018 0434   K 3.5 07/27/2018 0434   CL 104 07/27/2018 0434   CO2 28 07/27/2018 0434   GLUCOSE 102 (H) 07/27/2018 0434   BUN 7 07/27/2018 0434   CREATININE 0.67 07/27/2018 0434   CREATININE 0.86 03/26/2013 0907   CALCIUM 8.1 (L) 07/27/2018 0434   GFRNONAA >60 07/27/2018 0434   GFRAA >60 07/27/2018 0434   CBC    Component Value Date/Time   WBC 9.7 07/27/2018 0434   RBC 2.92 (L) 07/27/2018 0434   HGB 7.5 (L) 07/31/2018 0416   HCT 24.7 (L) 07/31/2018 0416   PLT 434 (H) 07/27/2018 0434   MCV 87.7 07/27/2018 0434   MCH 25.7 (L) 07/27/2018 0434   MCHC 29.3 (L) 07/27/2018 0434   RDW 15.7 (H) 07/27/2018 0434   LYMPHSABS 0.8 07/14/2018 0832   MONOABS 0.9 07/14/2018 0832   EOSABS 0.1 07/14/2018 0832   BASOSABS 0.0 07/14/2018 0832   CBG's (last 3):  No results for input(s): GLUCAP in the last 72 hours. LFT's Lab Results  Component Value Date   ALT 10 07/10/2018   AST 27 07/10/2018   ALKPHOS 68 07/10/2018   BILITOT 0.6 07/10/2018     Studies/Results: No results found.  Medications:  I have reviewed the patient's current medications. Scheduled Medications: . amLODipine  5 mg Oral Daily  . bisacodyl  10 mg Rectal Q3 days  . calcium-vitamin D  1 tablet Oral Daily  . DULoxetine  60 mg Oral Daily  . enoxaparin (LOVENOX) injection  30 mg Subcutaneous Q24H  . feeding supplement  1 Container Oral BID BM  . ferrous gluconate  324 mg Oral TID WC  . morphine  15 mg Oral Q12H  . multivitamin with minerals  1 tablet Oral Daily  . pantoprazole  40 mg Oral BID AC  . polycarbophil  625 mg Oral Daily  . potassium chloride  20 mEq Oral BID  . saccharomyces boulardii  250 mg Oral BID  . tamsulosin  0.4 mg Oral Daily  . vitamin B-12  1,000 mcg Oral Daily   PRN Medications: sodium chloride, acetaminophen **OR** acetaminophen, alum & mag hydroxide-simeth, bisacodyl, HYDROcodone-acetaminophen, methocarbamol **OR** methocarbamol (ROBAXIN) IV, ondansetron **OR** ondansetron (ZOFRAN) IV, sodium chloride flush, sorbitol  Assessment/Plan: Principal Problem:   Thoracic myelopathy Active Problems:   Forestier's disease of thoracolumbar region   Degenerative disc disease, lumbar   Abscess in epidural space of L2-L5 lumbar spine   Left cervical radiculopathy   Complete tear of left rotator cuff   MSSA bacteremia   Hypokalemia  Hypertension   Anemia of chronic disease   Labile blood pressure   Urinary retention   Chronic pain syndrome   1.  Prolonged debility with thoracic myelopathy related to spinal cord infection and postoperative complications.  See below for septic and infectious disease issues.  Complicated orthopedic situation with shoulder pathology and neuro deficits complicated by infection and morbid obesity.  Continue medical management of various issues as prescribed.  Awaiting skilled nursing facility placement. 2.  MSSA bacteremia.  On IV Ancef through October 29 as per ID related to left septic arthritis and  spinal issues. 3.  Persistent loose bowels despite holding medication regimen for bowels in setting of neurogenic bladder.  Suspect aggravated by antibiotics but remains afebrile.  Continue probiotic and fiber as prescribed. 4.  Urinary retention due to neurogenic issues.  Continue I/O cath with Flomax as prescribed 5.  Anemia, chronic without evidence of acute blood loss.  Continue iron supplement and recheck CBC as needed  Length of stay, days: 24  [name] [name] A. Felicity Coyer, MD 08/02/2018, 11:02 AM

## 2018-08-03 ENCOUNTER — Inpatient Hospital Stay (HOSPITAL_COMMUNITY): Payer: No Typology Code available for payment source | Admitting: Physical Therapy

## 2018-08-03 ENCOUNTER — Inpatient Hospital Stay (HOSPITAL_COMMUNITY): Payer: No Typology Code available for payment source | Admitting: Occupational Therapy

## 2018-08-03 NOTE — Progress Notes (Addendum)
Physical Therapy Session Note  Patient Details  Name: [name] [name] MRN: [id number] Date of Birth: Aug 11, 1956  Today's Date: 08/03/2018 PT Individual Time: 6213-0865 and 1400-1415 PT Individual Time Calculation (min): 45 min and 15 min   Short Term Goals: Week 4:  PT Short Term Goal 1 (Week 4): = LTG  Skilled Therapeutic Interventions/Progress Updates:    pt requests to attempt standing in parallel bars. Pt performs partial stands x 10 with use of maxi sky with focus on LE mm contraction and forward wt shifts.  Pt performs kinetron 3 x 20 reps for bilat LE strengthening. Pt left in w/c with needs at hand, nursing present.  Session 2: pt initially refuses session due to increased back pain after wound vac change. With encouragement, pt agreeable to get out of bed and sit in recliner.  Pt min A for rolling both directions to place sling.  Maxi sky transfer to recliner with pt encouraged to sit up until dinner time. Pt left with needs at hand, RN present.  Therapy Documentation Precautions:  Precautions Precautions: Fall Precaution Booklet Issued: No Precaution Comments: wound VAC; Restrictions Weight Bearing Restrictions: No Pain: Pt c/o back pain, meds given prior to session, repositioned as needed   Therapy/Group: Individual Therapy  [name] [name] 08/03/2018, 11:45 AM

## 2018-08-03 NOTE — Plan of Care (Signed)
  Problem: Consults Goal: RH SPINAL CORD INJURY PATIENT EDUCATION Description  See Patient Education module for education specifics.  Outcome: Progressing   Problem: SCI BOWEL ELIMINATION Goal: RH STG MANAGE BOWEL WITH ASSISTANCE Description STG Manage Bowel with Mod Assistance.  Outcome: Progressing Goal: RH STG SCI MANAGE BOWEL PROGRAM W/ASSIST OR AS APPROPRIATE Description STG SCI Manage bowel program w/ Mod assist or as appropriate.  Outcome: Progressing   Problem: SCI BLADDER ELIMINATION Goal: RH STG MANAGE BLADDER WITH ASSISTANCE Description STG Manage Bladder With Mod Assistance  Outcome: Progressing Goal: RH STG MANAGE BLADDER WITH EQUIPMENT WITH ASSISTANCE Description STG Manage Bladder With Equipment With Mod Assistance  Outcome: Progressing   Problem: RH SKIN INTEGRITY Goal: RH STG SKIN FREE OF INFECTION/BREAKDOWN Description No new breakdown with min assist   Outcome: Progressing Goal: RH STG ABLE TO PERFORM INCISION/WOUND CARE W/ASSISTANCE Description STG Able To Perform Incision/Wound Care With Max Assistance.  Outcome: Progressing   Problem: RH PAIN MANAGEMENT Goal: RH STG PAIN MANAGED AT OR BELOW PT'S PAIN GOAL Description <3 out of 10.   Outcome: Progressing   

## 2018-08-03 NOTE — Progress Notes (Signed)
[facility] PHYSICAL MEDICINE & REHABILITATION PROGRESS NOTE   Subjective/Complaints:  No new complaints. Pain better. Vac leaked last night  ROS: Patient denies fever, rash, sore throat, blurred vision, nausea, vomiting, diarrhea, cough, shortness of breath or chest pain, joint or back pain, headache, or mood change.   Objective:   No results found. No results for input(s): WBC, HGB, HCT, PLT in the last 72 hours. No results for input(s): NA, K, CL, CO2, GLUCOSE, BUN, CREATININE, CALCIUM in the last 72 hours.  Intake/Output Summary (Last 24 hours) at 08/03/2018 0908 Last data filed at 08/03/2018 0800 Gross per 24 hour  Intake 380 ml  Output 1100 ml  Net -720 ml     Physical Exam: Vital Signs Blood pressure (!) 142/90, pulse 83, temperature 97.7 F (36.5 C), resp. rate 18, height 5\' 10"  (1.778 m), weight (!) 189.6 kg, SpO2 100 %.  Constitutional: No distress . Vital signs reviewed. HEENT: EOMI, oral membranes moist Neck: supple Cardiovascular: RRR without murmur. No JVD    Respiratory: CTA Bilaterally without wheezes or rales. Normal effort    GI: BS +, non-tender, non-distended  Musc: LBP, left shoulder ROM improved. Neurological: He isalertand oriented  Motor:  5/5 in the right deltoid, bicep, tricep, grip  2+/5 right HE, KE, ADF stable 2- to 2/5 LLE stable Skin:vac on right low back sealed. Remaining odor Psych:pleasant and cooperative.   Assessment/Plan: 1. Functional deficits secondary to thoracic SCI, septic arthritis which require 3+ hours per day of interdisciplinary therapy in a comprehensive inpatient rehab setting.  Physiatrist is providing close team supervision and 24 hour management of active medical problems listed below.  Physiatrist and rehab team continue to assess barriers to discharge/monitor patient progress toward functional and medical goals  Care Tool:  Bathing    Body parts bathed by patient: Left arm, Chest, Abdomen, Face   Body parts  bathed by helper: Front perineal area, Buttocks, Right arm, Right upper leg, Left upper leg, Right lower leg, Left lower leg     Bathing assist Assist Level: Maximal Assistance - Patient 24 - 49%     Upper Body Dressing/Undressing Upper body dressing Upper body dressing/undressing activity did not occur (including orthotics): Safety/medical concerns What is the patient wearing?: Pull over shirt    Upper body assist Assist Level: Supervision/Verbal cueing    Lower Body Dressing/Undressing Lower body dressing      What is the patient wearing?: Pants     Lower body assist Assist for lower body dressing: Moderate Assistance - Patient 50 - 74%     Toileting Toileting Toileting Activity did not occur Press photographer and hygiene only): Safety/medical concerns  Toileting assist Assist for toileting: 2 Helpers     Transfers Chair/bed transfer  Transfers assist     Chair/bed transfer assist level: Dependent - mechanical lift     Locomotion Ambulation   Ambulation assist   Ambulation activity did not occur: Safety/medical concerns          Walk 10 feet activity   Assist           Walk 50 feet activity   Assist           Walk 150 feet activity   Assist           Walk 10 feet on uneven surface  activity   Assist           Wheelchair     Assist Will patient use wheelchair at discharge?: Yes Type of  Wheelchair: Media planner activity did not occur: Safety/medical concerns  Wheelchair assist level: Minimal Assistance - Patient > 75% Max wheelchair distance: 50    Wheelchair 50 feet with 2 turns activity    Assist    Wheelchair 50 feet with 2 turns activity did not occur: Safety/medical concerns   Assist Level: Minimal Assistance - Patient > 75%   Wheelchair 150 feet activity     Assist Wheelchair 150 feet activity did not occur: Safety/medical concerns   Assist Level: Supervision/Verbal cueing     Medical Problem List and Plan: 1.Decreased functional mobilitysecondary to left C5 radiculopathy,  thoracic cord contusion with myelopathy/chronic radiculopathy-status post lumbar wound debridement drainage irrigation 9/17/2019with wound VAC placement, septic arthritis left knee MSSA bacteremia             -   SNF pending 2. DVT Prophylaxis/Anticoagulation: Subcutaneous Lovenox.             -dopplers negative 3. Pain Management:Robaxin and hydrocodone as needed-     -  hydrocodone   7.5mg  q4 prn               -continue ms contin 15mg  q12  4. Mood:Cymbalta 60 mg daily. Provide emotional support 5. Neuropsych: This patientiscapable of making decisions on hisown behalf. 6. Skin/Wound Care: wound closing             -WOC RN assisting with vac  -ortho following 7. Fluids/Electrolytes/Nutrition: push po            8.ID/MSSA bacteremia. IV Ancef 2 g every 8 hours through 08/11/2018 and stoped. Follow-up infectious disease.  9.Septic arthritis left knee. Status post arthroscopy irrigation and drainage, no swelling or pain with range of motion 10.Left shoulder full-thickness retracted supraspinatus tendon tear. Conservative care             - improvement in left shoulder pain s/p steroid injection 10/3   11.Acute on chronic anemia. Normocytic  -Continue ferrous gluconate  -Hemoglobin holding at 7.5 10/18--recheck tomorrow             -stool OB negative x2             -continue serial CBC's 12.BPH/urinary retention.  Flomax 0.4 mg daily, I/O cath prn 13.Constipation. stool remains "mushy" per staff--held senokot-s  -likely antibiotic effect             -Continue probiotic, added fiber 13.  HTN       Vitals:   08/02/18 2102 08/03/18 0528  BP: 124/75 (!) 142/90  Pulse: 86 83  Resp: 18 18  Temp: 98.1 F (36.7 C) 97.7 F (36.5 C)  SpO2: 100% 100%  Adequate to borderline control 10/21  -continue norvasc 5mg  daily   14.  Hypokalemia              Potassium 3.5 10/14                    LOS: 25 days A FACE TO FACE EVALUATION WAS PERFORMED  Ranelle Oyster 08/03/2018, 9:08 AM

## 2018-08-03 NOTE — Consult Note (Addendum)
WOC Nurse wound follow up Patient seen and VAC dressing changed in [facility] 4W15.  Patient tolerated the procedure very well.  One piece of black foam removed, one piece placed into wound bed. Bridged to right side.  VAC settings adjusted to 125 mm/Hg setting as this is the standard setting.  Order modified to reflect this; unable to locate a rationale for the existing 100 mm/Hg setting. Wound type: Surgical Wound bed: Beefy red granulation tissue Drainage (amount, consistency, odor) minimal serosanginous in cannister Periwound: Intact, normal color and texture skin. Dressing procedure/placement/frequency: MWF VAC dressing changes by WOC team. I spoke with Dr. Otelia Sergeant via telephone to let him know about the [facility] dressing time and my order to have the primary RN remove it at 7am, place a saline moistened gauze so he can view the wound, then the WOC team will come at noon to replace the [facility] dressing.  He really liked this idea and agreed to the plan. Helmut Muster, RN, MSN, CWOCN, CNS-BC, pager [phone]

## 2018-08-03 NOTE — Progress Notes (Signed)
Nutrition Follow-up  DOCUMENTATION CODES:   Morbid obesity  INTERVENTION:   - Boost Breeze po BID, each supplement provides 250 kcal and 9 grams of protein  - yogurt daily with breakfast  - MVI with minerals daily  NUTRITION DIAGNOSIS:   Increased nutrient needs related to wound healing, post-op healing as evidenced by estimated needs.  Ongoing  GOAL:   Patient will meet greater than or equal to 90% of their needs  Progressing  MONITOR:   PO intake, Supplement acceptance, Labs, Weight trends, Skin, I & O's  REASON FOR ASSESSMENT:   Malnutrition Screening Tool    ASSESSMENT:   [name] [name] is a 62 year old right-handed male admitted 06/28/2018 with progressive lower extremity weakness as well as falls.  Noted therapies now recommending SNF. Pt awaiting placement.  Spoke with pt at bedside who reports that he is doing well. Pt endorses pain but reports it is much better controlled with new medications.  Pt states that he is eating well. RD asked about meal completion in chart being 0% on some occurrences; pt states that he is having outside food 1-2 times daily (example: [employer] chicken sandwich). RD reiterated importance of adequate protein with wound healing.  Pt states that the Boost Breeze supplements are "okay" and that he is willing to continue drinking them.  Meal Completion: 0-90% x last 8 recorded meals, extremely varied  Medications reviewed and include: Dulcolax q 3 days, Oscal with D daily, Boost Breeze BID, Fergon 324 mg TID with meals, MVI with minerals daily, Protonix 40 mg BID, Fibercon 625 mg daily, K-dur 20 mEq BID, Florastor BID, vitamin B-12 1000 mcg daily, IV antibiotics  Labs reviewed: hemoglobin 7.5 (L), HCT 24.7 (L)  UOP: 1100 ml x 24 hours VAC: 100 ml x 24 hours  Diet Order:   Diet Order            Diet regular Room service appropriate? Yes; Fluid consistency: Thin  Diet effective now              EDUCATION NEEDS:    Education needs have been addressed  Skin:  Skin Assessment: Skin Integrity Issues: Wound Vac: back Incisions: lt knee  Last BM:  10/21 - medium type 7  Height:   Ht Readings from Last 1 Encounters:  07/09/18 5\' 10"  (1.778 m)    Weight:   Wt Readings from Last 1 Encounters:  07/15/18 (!) 189.6 kg    Ideal Body Weight:  75.4 kg  BMI:  Body mass index is 59.98 kg/m.  Estimated Nutritional Needs:   Kcal:  2200-2400  Protein:  125-150 grams  Fluid:  > 2.2 L    Earma Reading, MS, RD, LDN Inpatient Clinical Dietitian Pager: [phone] Weekend/After Hours: [phone]

## 2018-08-03 NOTE — Discharge Summary (Signed)
Discharge summary job # [id number]

## 2018-08-03 NOTE — Progress Notes (Signed)
Occupational Therapy Session Note  Patient Details  Name: [name] [name] MRN: [id number] Date of Birth: 1956/03/22  Today's Date: 08/03/2018 OT Individual Time: 0845-1000 OT Individual Time Calculation (min): 75 min    Short Term Goals: Week 3:  OT Short Term Goal 1 (Week 3): Pt will complete BSC transfer with LRAD and 2 helpers  OT Short Term Goal 2 (Week 3): Pt will complete 1 grooming task in supported standing to improve standing tolerance  OT Short Term Goal 3 (Week 3): Pt will thread 1 LE into pants using reacher with Min A  Skilled Therapeutic Interventions/Progress Updates:    Upon entering the room, pt supine in bed with 5/10 c/o pain in lower back but agreeable to OT intervention. Pt received medication prior to OT arrival. Pt threading pants onto B feet with use of reacher and increased time. Pt needing assistance to pull over B hips and mod A to roll L <> R with use of bed rails. Pt rolling mod A of 1 to place sling under pt. Maxi sky utilized to transfer pt into wheelchair for safety. Pt able to scoot self back in wheelchair and therapist assisted with donning of B shoes. Pt propelled wheelchair to sink with supervision and performed grooming tasks at mod I level while seated. Pt engaged in B UE strengthening exercised with 3 lbs wrist weight on R and 2 lb weight on L UE. Pt performed 3 sets of 15 alternating punches and biceps curls with rest breaks after each rep secondary to fatigue. K pad placed on pts back to assist with pain management. Call bell and all needed items within reach upon exiting the room.   Therapy Documentation Precautions:  Precautions Precautions: Fall Precaution Booklet Issued: No Precaution Comments: wound VAC; Restrictions Weight Bearing Restrictions: No Pain: Pain Assessment Pain Scale: 0-10 Pain Score: 5    Therapy/Group: Individual Therapy  Alen Bleacher 08/03/2018, 12:39 PM

## 2018-08-04 ENCOUNTER — Inpatient Hospital Stay (HOSPITAL_COMMUNITY): Payer: No Typology Code available for payment source | Admitting: Occupational Therapy

## 2018-08-04 ENCOUNTER — Inpatient Hospital Stay (HOSPITAL_COMMUNITY): Payer: No Typology Code available for payment source | Admitting: Physical Therapy

## 2018-08-04 ENCOUNTER — Encounter (HOSPITAL_COMMUNITY): Payer: No Typology Code available for payment source | Admitting: Psychology

## 2018-08-04 LAB — BASIC METABOLIC PANEL
ANION GAP: 6 (ref 5–15)
BUN: 6 mg/dL (ref 6–20)
CHLORIDE: 105 mmol/L (ref 98–111)
CO2: 25 mmol/L (ref 22–32)
CREATININE: 0.57 mg/dL — AB (ref 0.61–1.24)
Calcium: 8.3 mg/dL — ABNORMAL LOW (ref 8.9–10.3)
GFR calc non Af Amer: >60 mL/min (ref >60)
GLUCOSE: 95 mg/dL (ref 70–99)
Potassium: 3.4 mmol/L — ABNORMAL LOW (ref 3.5–5.1)
Sodium: 136 mmol/L (ref 135–145)

## 2018-08-04 LAB — CBC
HEMATOCRIT: 25.1 % — AB (ref 39.0–52.0)
Hemoglobin: 7.6 g/dL — ABNORMAL LOW (ref 13.0–17.0)
MCH: 25.9 pg — AB (ref 26.0–34.0)
MCHC: 30.3 g/dL (ref 30.0–36.0)
MCV: 85.4 fL (ref 80.0–100.0)
NRBC: 0 % (ref 0.0–0.2)
Platelets: 345 10*3/uL (ref 150–400)
RBC: 2.94 MIL/uL — ABNORMAL LOW (ref 4.22–5.81)
RDW: 16.5 % — ABNORMAL HIGH (ref 11.5–15.5)
WBC: 6.7 10*3/uL (ref 4.0–10.5)

## 2018-08-04 MED ORDER — POTASSIUM CHLORIDE CRYS ER 20 MEQ PO TBCR: 20.0000 meq | EXTENDED_RELEASE_TABLET | Freq: Two times a day (BID) | ORAL | Status: DC

## 2018-08-04 MED ORDER — MORPHINE SULFATE ER 15 MG PO TBCR: 15.0000 mg | EXTENDED_RELEASE_TABLET | Freq: Two times a day (BID) | ORAL | 0 refills | Status: DC

## 2018-08-04 MED ORDER — TAMSULOSIN HCL 0.4 MG PO CAPS: 0.4000 mg | ORAL_CAPSULE | Freq: Every day | ORAL | Status: DC

## 2018-08-04 MED ORDER — BISACODYL 5 MG PO TBEC: 5.0000 mg | DELAYED_RELEASE_TABLET | Freq: Every day | ORAL | 0 refills | Status: DC | PRN

## 2018-08-04 MED ORDER — CALCIUM POLYCARBOPHIL 625 MG PO TABS: 625.0000 mg | ORAL_TABLET | Freq: Every day | ORAL | Status: DC

## 2018-08-04 MED ORDER — METHOCARBAMOL 500 MG PO TABS: 500.0000 mg | ORAL_TABLET | Freq: Four times a day (QID) | ORAL | 0 refills | Status: DC | PRN

## 2018-08-04 MED ORDER — PANTOPRAZOLE SODIUM 40 MG PO TBEC: 40.0000 mg | DELAYED_RELEASE_TABLET | Freq: Two times a day (BID) | ORAL | Status: DC

## 2018-08-04 MED ORDER — AMLODIPINE BESYLATE 5 MG PO TABS: 5.0000 mg | ORAL_TABLET | Freq: Every day | ORAL | Status: DC

## 2018-08-04 MED ORDER — SACCHAROMYCES BOULARDII 250 MG PO CAPS: 250.0000 mg | ORAL_CAPSULE | Freq: Two times a day (BID) | ORAL | Status: DC

## 2018-08-04 MED ORDER — FERROUS GLUCONATE 324 (38 FE) MG PO TABS: 324.0000 mg | ORAL_TABLET | Freq: Three times a day (TID) | ORAL | 3 refills | Status: DC

## 2018-08-04 MED ORDER — CALCIUM CARBONATE-VITAMIN D 500-200 MG-UNIT PO TABS: 1.0000 | ORAL_TABLET | Freq: Every day | ORAL | Status: DC

## 2018-08-04 MED ORDER — CEFAZOLIN SODIUM-DEXTROSE 2-4 GM/100ML-% IV SOLN: 2.0000 g | Freq: Three times a day (TID) | INTRAVENOUS | 0 refills | Status: AC

## 2018-08-04 MED ORDER — HYDROCODONE-ACETAMINOPHEN 7.5-325 MG PO TABS: 1.0000 | ORAL_TABLET | ORAL | 0 refills | Status: DC | PRN

## 2018-08-04 MED ORDER — ACETAMINOPHEN 325 MG PO TABS: 650.0000 mg | ORAL_TABLET | Freq: Four times a day (QID) | ORAL | Status: DC | PRN

## 2018-08-04 NOTE — Progress Notes (Signed)
Physical Therapy Session Note  Patient Details  Name: [name] [name] MRN: [id number] Date of Birth: 24-Jan-1956  Today's Date: 08/04/2018 PT Individual Time:  -   PT Amount of Missed Time (min): 30 Minutes PT Missed Treatment Reason: Patient fatigue;Pain;Patient unwilling to participate     Short Term Goals: Week 4:  PT Short Term Goal 1 (Week 4): = LTG  Skilled Therapeutic Interventions/Progress Updates:    Pt received seated in recliner in room, pt reports he is supposed to d/c to SNF today. Updated pt that per CSW he may not be d/c today and that he has scheduled therapy to participate in. Pt reports 7/10 pain in low back that is improving but due to fatigue and anticipation of upcoming d/c he declines to participate in therapy session. Pt missed 30 min of scheduled skilled therapy services.  Therapy Documentation Precautions:  Precautions Precautions: Fall Precaution Booklet Issued: No Precaution Comments: wound VAC; Restrictions Weight Bearing Restrictions: No General: PT Amount of Missed Time (min): 30 Minutes PT Missed Treatment Reason: Patient fatigue;Pain;Patient unwilling to participate   Therapy/Group: Individual Therapy  Peter Congo, PT, DPT  08/04/2018, 2:58 PM

## 2018-08-04 NOTE — Progress Notes (Signed)
Occupational Therapy Session Note  Patient Details  Name: [name] [name] MRN: [id number] Date of Birth: 12-16-1955  Today's Date: 08/04/2018 OT Individual Time: 2841-3244 OT Individual Time Calculation (min): 71 min    Short Term Goals: Week 3:  OT Short Term Goal 1 (Week 3): Pt will complete BSC transfer with LRAD and 2 helpers  OT Short Term Goal 2 (Week 3): Pt will complete 1 grooming task in supported standing to improve standing tolerance  OT Short Term Goal 3 (Week 3): Pt will thread 1 LE into pants using reacher with Min A  Skilled Therapeutic Interventions/Progress Updates:    Upon entering the room, pt supine in bed with 5/10 c/o lower back pain but agreeable to OT intervention. Pt utilized LH reacher to thread pants onto B feet and was able to roll L <> R with min A this session. Pt also able to pull pants up in front and partially on sides with majority of assist to pull over buttocks. Pt rolling again for placement of maxi sky sling. Pt requesting to be transferred into recliner chair for pain management. K pad placed on lower back per pt request. Pt received medications prior to OT arrival. Pt's breakfast present and pt requesting to eat with OT placing 3 lb wrist weights on B wrists for strengthening during functional task. Pt with no further needs or concerns this session. Pt remained seated in wheelchair and positioned for comfort with call bell and all needed items within reach.   Therapy Documentation Precautions:  Precautions Precautions: Fall Precaution Booklet Issued: No Precaution Comments: wound VAC; Restrictions Weight Bearing Restrictions: No General:   Vital Signs:   Pain: Pain Assessment Faces Pain Scale: No hurt Pain Type: Chronic pain Pain Location: Back Pain Orientation: Lower Pain Descriptors / Indicators: Aching Pain Onset: On-going Pain Intervention(s): RN made aware;Repositioned   Therapy/Group: Individual Therapy  Alen Bleacher 08/04/2018, 12:02 PM
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
## 2018-08-04 NOTE — Discharge Summary (Signed)
[name] [name], [name] [name] MEDICAL RECORD ZO:[id number] ACCOUNT [id number] DATE OF BIRTH:08/29/1956 FACILITY: [facility] LOCATION: [facility] PHYSICIAN:ZACHARY SWARTZ, MD  DISCHARGE SUMMARY  DATE OF DISCHARGE:  08/07/2018  DISCHARGE DIAGNOSES: 1.  Left C5 radiculopathy, thoracic cord contusion with myelopathy, chronic radiculopathy, status post lumbar pain debridement and drainage, irrigation 06/30/2018 with wound VAC placement. 2.  Septic arthritis, left knee methicillin-sensitive Staphylococcus aureus bacteremia. 3.  Subcutaneous Lovenox for deep venous thrombosis prophylaxis. 4.  Pain management. 5.  Mood. 6.  Infectious disease with methicillin-sensitive Staphylococcus aureus bacteremia. 7.  Septic arthritis, left knee.   8.  Left shoulder full thickness retracted supraspinatus tendon tear.   9.  Acute on chronic anemia. 10.  Benign prostatic hyperplasia with urinary retention. 11.  Constipation. 12.  Hypertension.  HOSPITAL COURSE:  This is a 62 year old right-handed male admitted 06/28/2018 with progressive lower extremity weakness as well as falls.  He lives with spouse.  Needed assistance with ADLs prior to admission.  Noted history of lumbar multilevel central  laminectomy and recurrent lumbar spinal stenosis, multiple back surgeries with resultant foot drop.  MRI thoracic, lumbar spine showed no fracture or malalignment ____ subcentimeter cord edema T7-T8.  Moderate to severe paraspinal muscle atrophy.   Multilevel neural foraminal narrowing, severe at T2-3, T9-T10 and T10-T11 as well as a 2.9 x 4.3 x 6.1 fluid collection.  The patient underwent lumbar wound debridement, drainage and irrigation 06/29/2018 per Dr. Otelia Sergeant as well as aspiration of left knee  consistent with septic arthritis.  Aspiration lumbar abscess positive for Staphylococcus aureus.  Infectious disease consulted.  TEE completed.  No vegetation.  Placed on intravenous Ancef per infectious disease through 08/11/2018.  Nephrology  consulted  for elevated creatinine 2.43.  Renal ultrasound no hydronephrosis.  Responding well to gentle IV fluids.  The patient with complaints of left shoulder pain.  MRI completed showing full thickness retracted supraspinatus tendon tear with current  conservative care recommended.  A followup MRI cervical spine completed for neck pain as well as left bicep weakness showing neural foraminal narrowing at C2-C3 through C6-C7.  Severe from C3-C4 through C6-7.  Current plan is follow up outpatient.  The  patient also with persistent left wrist pain as well as swelling.  X-rays of left wrist showed extensive soft tissue swelling over the dorsum of the wrist without underlying fracture.  Nonspecific soft tissue edema without fluid collection.  Subcutaneous  Lovenox for DVT prophylaxis.  Wound care followup for surgical lumbar incision with wound VAC dressing change was recommended.  Bouts of urinary retention, placed on Flomax.  Therapy evaluations completed, the patient was admitted for comprehensive  rehabilitation program.  PAST MEDICAL HISTORY:  See discharge diagnoses.  SOCIAL HISTORY:  Lives with spouse.  Needed assistance with ADLs prior to admission.  FUNCTIONAL STATUS:  Upon admission to rehab services was +2 physical assist sit to stand, +3 assistance for listing, max assist lateral scoot transfers, max total assist with activities of daily living.  PHYSICAL EXAMINATION: VITAL SIGNS:  Blood pressure 153/86, pulse 91, temperature 98, respirations 17. GENERAL:  Alert male, oriented x3. HEENT:  EOMs intact. NECK:  Supple, nontender, no JVD. CARDIOVASCULAR:  Rate controlled. ABDOMEN:  Soft, nontender, good bowel sounds. BACK:  Incision was dressed with a wound VAC in place.  REHABILITATION HOSPITAL COURSE:  The patient was admitted to inpatient rehabilitation services.  Therapies initiated on a 3-hour daily basis, consisting of physical therapy, occupational therapy and rehabilitation  nursing.  The following issues were  addressed during patient's  rehabilitation stay.  Pertaining to the patient's cervical C5 radiculopathy, thoracic cord contusion and myelopathy, he had undergone complex lumbar wound debridement, drainage, irrigation 06/30/2018 with wound VAC placement  followed by wound care nurse as well as Dr. Otelia Sergeant of orthopedic services with wound VAC changes Mondays, Wednesdays and Fridays.  Subcutaneous Lovenox for DVT prophylaxis.  Venous Doppler studies negative.  Pain management with the use of MS Contin 15 mg  every 12 hours, Robaxin and hydrocodone as needed.  Mood stabilization with Cymbalta.  He remained on intravenous Ancef clinically extremely hours through 08/11/2018 per infectious disease for MSSA bacteremia.  Conservative care of left shoulder full  thickness retracted supraspinatus tendon tear.  He did have ____.  Acute on chronic anemia, iron supplement, hemoglobin holding at 7.5, latest guaiac stool x2 negative.  Bouts of urinary retention maintained with Flomax.  Constipation with laxative  assistance.  Blood pressure is overall controlled.  The patient received weekly collaborative interdisciplinary team conferences to discuss estimated length of stay, family teaching, any barriers to discharge.  Performs partial stands x10 with use of  Maxi Sky with focus on lower extremity contractures and forward weight shifts.  He performs ____ 3 x 20 reps for bilateral lower extremity strengthening.  Minimal assist with rolling both directions.  Max total assist for activities of daily living.  Due  to limited ____, his wife cannot provide the necessary assistance at home and felt skilled nursing facility was needed.  A bed became available 08/04/2018.    DISCHARGE MEDICATIONS:  Included Norvasc 10 mg p.o. daily, Dulcolax suppository daily, Os-Cal 1 tab p.o. daily, intravenous Ancef 2 grams q.8 hours through 08/11/2018 then stop, Cymbalta 60 mg p.o. daily, ferrous gluconate  324 mg 3 times daily, MS Contin  15 mg p.o. every 12 hours, multivitamin 1 tablet p.o. daily, Protonix 40 mg p.o. b.i.d., FiberCon 625 mg p.o. daily, potassium chloride 20 mEq p.o. b.i.d., Florastor 250 mg p.o. b.i.d., Flomax 0.4 mg p.o. daily, vitamin B12 1000 mcg p.o. daily,  hydrocodone 7.5/325 mg 1 tablet every 4 hours as needed for pain, Robaxin 500 mg p.o. every 6 hours as needed for muscle spasms.  DIET:  Regular consistency.    FOLLOWUP:  The patient followed by Dr. Faith Rogue at the outpatient rehab service office as directed; Dr. Vira Browns, call for appointment; Dr. Johny Sax of infectious disease, call for appointment.  SPECIAL INSTRUCTIONS:  Continue intravenous Ancef 2 g every 8 hours until 08/11/2018 and stop.  Continue wound VAC care to back incision, change every Monday, Wednesday, Friday.  Follow up with Dr. Otelia Sergeant of orthopedic service in regards to questions  regarding wound VAC.  TN/NUANCE D:08/03/2018 T:08/04/2018 JOB:[id number]

## 2018-08-04 NOTE — Progress Notes (Signed)
Patient ID: [name] [name], male   DOB: 11-28-1955, 62 y.o.   MRN: [id number] Call from Wound care and osteomy nursing, Schedule tomorrow for patient not permitting evaluation during VAC Change. I plan to come by in the morning at about 8 AM to assess the lumbar incision and then applied a NS wet to dry dressing until [facility] team can redo VAC in the early afternopn.

## 2018-08-04 NOTE — Progress Notes (Signed)
Occupational Therapy Session Note  Patient Details  Name: Dagoberto Riggenbach MRN: [id number] Date of Birth: 10/22/1955  Today's Date: 08/04/2018 OT Individual Time: 1030-1100 OT Individual Time Calculation (min): 30 min    Short Term Goals: Week 1:  OT Short Term Goal 1 (Week 1): Pt will transfer to w/c MAX A of 2 to w/c in prep for BSC transfer OT Short Term Goal 1 - Progress (Week 1): Progressing toward goal OT Short Term Goal 2 (Week 1): Pt will don shirt MOD A OT Short Term Goal 2 - Progress (Week 1): Met OT Short Term Goal 3 (Week 1): Pt will recall hemi dressing techniques for UB dressing with min question cues OT Short Term Goal 3 - Progress (Week 1): Met OT Short Term Goal 4 (Week 1): Pt will groom at sink wiht supervision and AE PRN OT Short Term Goal 4 - Progress (Week 1): Met Week 2:  OT Short Term Goal 1 (Week 2): Pt will transfer to w/c with Max A of 2 in prep for BSC transfer  OT Short Term Goal 1 - Progress (Week 2): Not met OT Short Term Goal 2 (Week 2): Pt will complete 1/3 components of donning pants with AE as needed OT Short Term Goal 2 - Progress (Week 2): Not met OT Short Term Goal 3 (Week 2): Pt will don footwear with Mod A using AE OT Short Term Goal 3 - Progress (Week 2): Not met  Skilled Therapeutic Interventions/Progress Updates:    Pt agreeable to working on transfers using the maxisky to be able to accurately measure his weight. +2 A for safety.  Pt transferred from bariatric recliner to arm chair sitting on scale with the lift.  Pt tolerated sitting in arm chair well.  Weight of arm chair 24.4 lbs, weight with pt in arm chair 296.6 so pt's actual weight = 272.2 lbs  Pt transferred back to recliner with all needs met.   Therapy Documentation Precautions:  Precautions Precautions: Fall Precaution Booklet Issued: No Precaution Comments: wound VAC; Restrictions Weight Bearing Restrictions: No   Pain: Pain Assessment Pain Scale: 0-10 Pain Score: 7  Faces  Pain Scale: No hurt Pain Type: Chronic pain Pain Location: Back Pain Orientation: Lower Pain Descriptors / Indicators: Aching Pain Frequency: Intermittent Pain Onset: On-going Patients Stated Pain Goal: 0 Pain Intervention(s): Medication (See eMAR);Repositioned;Heat applied Multiple Pain Sites: No  Therapy/Group: Individual Therapy  SAGUIER,JULIA 08/04/2018, 12:34 PM  

## 2018-08-04 NOTE — Progress Notes (Signed)
[facility] PHYSICAL MEDICINE & REHABILITATION PROGRESS NOTE   Subjective/Complaints:  Pt doing fairly well. No real changes  ROS: Patient denies fever, rash, sore throat, blurred vision, nausea, vomiting, diarrhea, cough, shortness of breath or chest pain,   headache, or mood change.   Objective:   No results found. Recent Labs    08/04/18 0456  WBC 6.7  HGB 7.6*  HCT 25.1*  PLT 345   Recent Labs    08/04/18 0456  NA 136  K 3.4*  CL 105  CO2 25  GLUCOSE 95  BUN 6  CREATININE 0.57*  CALCIUM 8.3*    Intake/Output Summary (Last 24 hours) at 08/04/2018 0909 Last data filed at 08/04/2018 0814 Gross per 24 hour  Intake 550 ml  Output 1050 ml  Net -500 ml     Physical Exam: Vital Signs Blood pressure 122/82, pulse 86, temperature 98.7 F (37.1 C), resp. rate 18, height 5\' 10"  (1.778 m), weight (!) 189.6 kg, SpO2 98 %.  Constitutional: No distress . Vital signs reviewed. HEENT: EOMI, oral membranes moist Neck: supple Cardiovascular: RRR without murmur. No JVD    Respiratory: CTA Bilaterally without wheezes or rales. Normal effort    GI: BS +, non-tender, non-distended  Musc: LBP, left shoulder ROM improved. Left knee tender Neurological: He isalertand oriented  Motor:  5/5 in the right deltoid, bicep, tricep, grip  2+/5 right HE, KE, ADF stable 2- to 2/5 LLE stable---no motor change Skin:vac on right low back sealed.  Psych:pleasant and cooperative.   Assessment/Plan: 1. Functional deficits secondary to thoracic SCI, septic arthritis which require 3+ hours per day of interdisciplinary therapy in a comprehensive inpatient rehab setting.  Physiatrist is providing close team supervision and 24 hour management of active medical problems listed below.  Physiatrist and rehab team continue to assess barriers to discharge/monitor patient progress toward functional and medical goals  Care Tool:  Bathing    Body parts bathed by patient: Left arm, Chest,  Abdomen, Face   Body parts bathed by helper: Front perineal area, Buttocks, Right arm, Right upper leg, Left upper leg, Right lower leg, Left lower leg     Bathing assist Assist Level: Maximal Assistance - Patient 24 - 49%     Upper Body Dressing/Undressing Upper body dressing Upper body dressing/undressing activity did not occur (including orthotics): Safety/medical concerns What is the patient wearing?: Hospital gown only    Upper body assist Assist Level: Supervision/Verbal cueing    Lower Body Dressing/Undressing Lower body dressing      What is the patient wearing?: Pants     Lower body assist Assist for lower body dressing: Moderate Assistance - Patient 50 - 74%     Toileting Toileting Toileting Activity did not occur Press photographer and hygiene only): Safety/medical concerns  Toileting assist Assist for toileting: 2 Helpers     Transfers Chair/bed transfer  Transfers assist     Chair/bed transfer assist level: Dependent - mechanical lift     Locomotion Ambulation   Ambulation assist   Ambulation activity did not occur: Safety/medical concerns          Walk 10 feet activity   Assist           Walk 50 feet activity   Assist           Walk 150 feet activity   Assist           Walk 10 feet on uneven surface  activity   Assist Walk 10  feet on uneven surfaces activity did not occur: Safety/medical concerns         Wheelchair     Assist Will patient use wheelchair at discharge?: Yes Type of Wheelchair: Manual Wheelchair activity did not occur: Safety/medical concerns  Wheelchair assist level: Minimal Assistance - Patient > 75% Max wheelchair distance: 50    Wheelchair 50 feet with 2 turns activity    Assist    Wheelchair 50 feet with 2 turns activity did not occur: Safety/medical concerns   Assist Level: Minimal Assistance - Patient > 75%   Wheelchair 150 feet activity     Assist Wheelchair 150  feet activity did not occur: Safety/medical concerns   Assist Level: Supervision/Verbal cueing    Medical Problem List and Plan: 1.Decreased functional mobilitysecondary to left C5 radiculopathy,  thoracic cord contusion with myelopathy/chronic radiculopathy-status post lumbar wound debridement drainage irrigation 9/17/2019with wound VAC placement, septic arthritis left knee MSSA bacteremia             -   SNF pending 2. DVT Prophylaxis/Anticoagulation: Subcutaneous Lovenox.             -dopplers negative 3. Pain Management:Robaxin and hydrocodone as needed-     -  hydrocodone   7.5mg  q4 prn               -continue ms contin 15mg  q12   -reasonable control at present 4. Mood:Cymbalta 60 mg daily. Provide emotional support 5. Neuropsych: This patientiscapable of making decisions on hisown behalf. 6. Skin/Wound Care: wound closing             -WOC RN assisting with vac  -ortho following 7. Fluids/Electrolytes/Nutrition: push po           -I personally reviewed the patient's labs today.      8.ID/MSSA bacteremia. IV Ancef 2 g every 8 hours through 08/11/2018 and stoped. Follow-up infectious disease.  9.Septic arthritis left knee. Status post arthroscopy irrigation and drainage, no swelling or pain with range of motion 10.Left shoulder full-thickness retracted supraspinatus tendon tear. Conservative care             - improvement in left shoulder pain s/p steroid injection 10/3   11.Acute on chronic anemia. Normocytic  -Continue ferrous gluconate  -Hemoglobin holding at 7.6 today 10/22             -stool OB negative x2             -continue serial CBC's 12.BPH/urinary retention.  Flomax 0.4 mg daily, I/O cath prn 13.Constipation. stool remains "mushy" per staff--held senokot-s  -likely antibiotic effect             -Continue probiotic, added fiber 13.  HTN       Vitals:   08/03/18 2200 08/04/18 0515  BP: 117/71 122/82  Pulse: 91 86  Resp: 18 18   Temp: 98.4 F (36.9 C) 98.7 F (37.1 C)  SpO2: 100% 98%  Adequate control 10/22  -continue norvasc 5mg  daily   14.  Hypokalemia             Potassium 3.4 10/22--continue supp                    LOS: 26 days A FACE TO FACE EVALUATION WAS PERFORMED  Ranelle Oyster 08/04/2018, 9:09 AM

## 2018-08-05 ENCOUNTER — Inpatient Hospital Stay (HOSPITAL_COMMUNITY): Payer: No Typology Code available for payment source | Admitting: Physical Therapy

## 2018-08-05 ENCOUNTER — Inpatient Hospital Stay (HOSPITAL_COMMUNITY): Payer: No Typology Code available for payment source | Admitting: Occupational Therapy

## 2018-08-05 MED ORDER — CALCIUM POLYCARBOPHIL 625 MG PO TABS
625.0000 mg | ORAL_TABLET | Freq: Two times a day (BID) | ORAL | Status: DC
  Administered 2018-08-05 – 2018-08-07 (×4): 625 mg via ORAL
  Filled 2018-08-05 (×4): qty 1

## 2018-08-05 NOTE — Progress Notes (Signed)
[facility] PHYSICAL MEDICINE & REHABILITATION PROGRESS NOTE   Subjective/Complaints:  Having more loose stool over the last 24. Otherwise no new complaints  ROS: Patient denies fever, rash, sore throat, blurred vision, nausea, vomiting, diarrhea, cough, shortness of breath or chest pain, joint or back pain, headache, or mood change.   Objective:   No results found. Recent Labs    08/04/18 0456  WBC 6.7  HGB 7.6*  HCT 25.1*  PLT 345   Recent Labs    08/04/18 0456  NA 136  K 3.4*  CL 105  CO2 25  GLUCOSE 95  BUN 6  CREATININE 0.57*  CALCIUM 8.3*    Intake/Output Summary (Last 24 hours) at 08/05/2018 0911 Last data filed at 08/05/2018 0140 Gross per 24 hour  Intake 440 ml  Output 650 ml  Net -210 ml     Physical Exam: Vital Signs Blood pressure (!) 150/96, pulse 77, temperature 97.6 F (36.4 C), resp. rate 18, height 5\' 10"  (1.778 m), weight 123.5 kg, SpO2 100 %.  Constitutional: No distress . Vital signs reviewed. HEENT: EOMI, oral membranes moist Neck: supple Cardiovascular: RRR without murmur. No JVD    Respiratory: CTA Bilaterally without wheezes or rales. Normal effort    GI: BS +, non-tender, non-distended   Musc: LBP, left shoulder ROM improved. Left knee tender Neurological: He isalertand oriented  Motor:  5/5 in the right deltoid, bicep, tricep, grip  2+/5 right HE, KE, ADF stable 2- to 2/5 LLE stable---no motor change Skin:vac on right low back sealed.--not visualized today  Psych:pleasant and cooperative.   Assessment/Plan: 1. Functional deficits secondary to thoracic SCI, septic arthritis which require 3+ hours per day of interdisciplinary therapy in a comprehensive inpatient rehab setting.  Physiatrist is providing close team supervision and 24 hour management of active medical problems listed below.  Physiatrist and rehab team continue to assess barriers to discharge/monitor patient progress toward functional and medical goals  Care  Tool:  Bathing    Body parts bathed by patient: Left arm, Chest, Abdomen, Face   Body parts bathed by helper: Front perineal area, Buttocks, Right arm, Right upper leg, Left upper leg, Right lower leg, Left lower leg     Bathing assist Assist Level: Maximal Assistance - Patient 24 - 49%     Upper Body Dressing/Undressing Upper body dressing Upper body dressing/undressing activity did not occur (including orthotics): Safety/medical concerns What is the patient wearing?: Hospital gown only    Upper body assist Assist Level: Supervision/Verbal cueing    Lower Body Dressing/Undressing Lower body dressing      What is the patient wearing?: Pants     Lower body assist Assist for lower body dressing: Moderate Assistance - Patient 50 - 74%     Toileting Toileting Toileting Activity did not occur Press photographer and hygiene only): Safety/medical concerns  Toileting assist Assist for toileting: 2 Helpers     Transfers Chair/bed transfer  Transfers assist     Chair/bed transfer assist level: Dependent - mechanical lift     Locomotion Ambulation   Ambulation assist   Ambulation activity did not occur: Safety/medical concerns          Walk 10 feet activity   Assist           Walk 50 feet activity   Assist           Walk 150 feet activity   Assist           Walk 10 feet  on uneven surface  activity   Assist Walk 10 feet on uneven surfaces activity did not occur: Safety/medical concerns         Wheelchair     Assist Will patient use wheelchair at discharge?: Yes Type of Wheelchair: Manual Wheelchair activity did not occur: Safety/medical concerns  Wheelchair assist level: Minimal Assistance - Patient > 75% Max wheelchair distance: 50    Wheelchair 50 feet with 2 turns activity    Assist    Wheelchair 50 feet with 2 turns activity did not occur: Safety/medical concerns   Assist Level: Minimal Assistance - Patient >  75%   Wheelchair 150 feet activity     Assist Wheelchair 150 feet activity did not occur: Safety/medical concerns   Assist Level: Supervision/Verbal cueing    Medical Problem List and Plan: 1.Decreased functional mobilitysecondary to left C5 radiculopathy,  thoracic cord contusion with myelopathy/chronic radiculopathy-status post lumbar wound debridement drainage irrigation 9/17/2019with wound VAC placement, septic arthritis left knee MSSA bacteremia             - SNF pending 2. DVT Prophylaxis/Anticoagulation: Subcutaneous Lovenox.             -dopplers negative 3. Pain Management:Robaxin and hydrocodone as needed-     -  hydrocodone   7.5mg  q4 prn               -continue ms contin 15mg  q12   -reasonable control at present 4. Mood:Cymbalta 60 mg daily. Provide emotional support 5. Neuropsych: This patientiscapable of making decisions on hisown behalf. 6. Skin/Wound Care: wound closing             -WOC RN assisting with vac  -ortho following 7. Fluids/Electrolytes/Nutrition: push po           -I personally reviewed the patient's labs today.      8.ID/MSSA bacteremia. IV Ancef 2 g every 8 hours through 08/11/2018.   Follow-up infectious disease.  9.Septic arthritis left knee. Status post arthroscopy irrigation and drainage, no swelling or pain with range of motion 10.Left shoulder full-thickness retracted supraspinatus tendon tear. Conservative care             - improvement in left shoulder pain s/p steroid injection 10/3   11.Acute on chronic anemia. Normocytic  -Continue ferrous gluconate  -Hemoglobin holding at 7.6 today 10/22             -stool OB negative x2             -continue serial CBC's 12.BPH/urinary retention.  Flomax 0.4 mg daily, I/O cath prn 13.Bowels: stool more loose  -likely antibiotic effect             -Continue probiotic, increase fiber 13.  HTN       Vitals:   08/04/18 2234 08/05/18 0538  BP: 133/74 (!) 150/96   Pulse: 94 77  Resp: 18 18  Temp: 98.5 F (36.9 C) 97.6 F (36.4 C)  SpO2: 97% 100%  Adequate control 10/23  -continue norvasc 5mg  daily   14.  Hypokalemia             Potassium 3.4 10/23--continue supp                    LOS: 27 days A FACE TO FACE EVALUATION WAS PERFORMED  Ranelle Oyster 08/05/2018, 9:11 AM

## 2018-08-05 NOTE — Patient Care Conference (Signed)
Inpatient RehabilitationTeam Conference and Plan of Care Update Date: 08/04/2018   Time: 2:00 PM    Patient Name: [name] [name]      Medical Record Number: [id number]  Date of Birth: 03-10-1956 Sex: Male         Room/Bed: 4W15C/4W15C-01 Payor Info: Payor: Advertising copywriter / Plan: GEHA / Product Type: *No Product type* /    Admitting Diagnosis: Epidural abcess  Admit Date/Time:  07/09/2018  5:13 PM Admission Comments: No comment available   Primary Diagnosis:  Thoracic myelopathy Principal Problem: Thoracic myelopathy  Patient Active Problem List   Diagnosis Date Noted  . Chronic pain syndrome   . Labile blood pressure   . Urinary retention   . Hypokalemia   . Hypertension   . Acute blood loss anemia   . Anemia of chronic disease   . Abscess in epidural space of lumbar spine   . Effusion, left knee   . Pain and swelling of wrist, left   . Complete tear of left rotator cuff 07/03/2018    Class: Chronic  . AKI (acute kidney injury) (HCC)   . MSSA bacteremia   . Septic arthritis of knee, left (HCC) 06/30/2018    Class: Acute  . Leukocytosis   . Postoperative seroma involving nervous system after nervous system procedure   . Shoulder pain, bilateral   . Thoracic myelopathy 06/28/2018  . Spondylosis, thoracic, with myelopathy 06/28/2018    Class: Acute  . Abscess in epidural space of L2-L5 lumbar spine 06/28/2018    Class: Acute  . Acute urinary retention 06/28/2018  . Left cervical radiculopathy 06/28/2018    Class: Acute  . Spinal stenosis of lumbar region 11/17/2017    Class: Chronic  . Status post lumbar laminectomy 11/17/2017  . Lumbar stenosis with neurogenic claudication 08/18/2017  . Forestier's disease of thoracolumbar region 08/18/2017  . Degenerative disc disease, lumbar 08/18/2017  . Lumbar radiculopathy 09/30/2016  . Morbid (severe) obesity due to excess calories (HCC) 09/30/2016  . Biliary calculus with acute cholecystitis 03/21/2016  . Acute  pyelonephritis 12/28/2013  . Ureteral stone with hydronephrosis 12/28/2013  . Acute kidney failure (HCC) 12/27/2013  . Severe sepsis with acute organ dysfunction (HCC) 12/27/2013  . Hypotension 12/27/2013  . Lactic acidosis 12/27/2013  . Sepsis secondary to UTI (HCC) 12/27/2013  . EMPYEMA CHEST 08/30/2008  . PRURIGO 08/30/2008  . OBSTRUCTIVE SLEEP APNEA 08/30/2008    Expected Discharge Date: Expected Discharge Date: (SNF)  Team Members Present: Physician leading conference: Dr. Faith Rogue Social Worker Present: Staci Acosta, LCSW Nurse Present: Tennis Must, RN PT Present: Teodoro Kil, PT OT Present: Callie Fielding, OT SLP Present: Colin Benton, SLP PPS Coordinator present : Tora Duck, RN, CRRN     Current Status/Progress Goal Weekly Team Focus  Medical   pain improved. ongoing wound issues. vac remains. some functional progress due to better pain control  pain, skin mgt, optimize emptying of bladder and bowels  pain rx, wound care/WOC RN   Bowel/Bladder   Incontinent of bowel; I&O cath q 8hrs; LBM 08/03/18  To be continent of bowel and have a normal bladder function with mod assist  Assess bowel/bladder functions q shift and as needed   Swallow/Nutrition/ Hydration             ADL's   maxi sky transfers +2, on night baths, UB with set up A, LB with mod A  Min-Mod A overall   transfers, strengthening, endurance, pt education, balance   Mobility   maxi  sky transfers, min A bed mobility  mod assist w/c level  d/c planning   Communication             Safety/Cognition/ Behavioral Observations            Pain   denied pain  <3  assess and treat pain q shift and as needed   Skin   scarttered chemical burns, surgical wound to lower back with wound vac  Free from infection/breakdown with min assist  Assess skin q shift and as needed    Rehab Goals Patient on target to meet rehab goals: No Rehab Goals Revised: none *See Care Plan and progress notes for long  and short-term goals.     Barriers to Discharge  Current Status/Progress Possible Resolutions Date Resolved   Physician    Medical stability;Wound Care;Neurogenic Bowel & Bladder        VAC out soon?, needs ongoing mgt of bowel and bladder      Nursing                  PT                    OT                  SLP                SW                Discharge Planning/Teaching Needs:  CSW is pursuing SNF transfer for pt.  Defer family education to next venue.   Team Discussion:  Pt is stable medically and ready for transfer to SNF when bed is available.  Pt has no new nursing needs.  Therapies continue to treat to work towards pt's goals.  Revisions to Treatment Plan:  none    Continued Need for Acute Rehabilitation Level of Care: The patient requires daily medical management by a physician with specialized training in physical medicine and rehabilitation for the following conditions: Daily direction of a multidisciplinary physical rehabilitation program to ensure safe treatment while eliciting the highest outcome that is of practical value to the patient.: Yes Daily medical management of patient stability for increased activity during participation in an intensive rehabilitation regime.: Yes Daily analysis of laboratory values and/or radiology reports with any subsequent need for medication adjustment of medical intervention for : Neurological problems;Urological problems;Wound care problems   I attest that I was present, lead the team conference, and concur with the assessment and plan of the team.   Sherlon Nied, [place] 08/05/2018, 12:20 PM

## 2018-08-05 NOTE — Progress Notes (Signed)
Occupational Therapy Discharge Summary  Patient Details  Name: [name] [name] MRN: [id number] Date of Birth: 1956/06/23     Patient has met 6 of 9 long term goals due to improved activity tolerance, improved balance and ability to compensate for deficits.  Patient to discharge at overall Min A - total +2 (transfers) level.    Reasons goals not met: Toileting goal not met secondary to pt requiring dependent lift transfer onto [facility] and often incontinent of BM. Pt also needing I & O cath for bladder. Bathing goal not met as pt needing mod A to complete task.  Recommendation:  Patient will benefit from ongoing skilled OT services in skilled nursing facility setting to continue to advance functional skills in the area of BADL and Reduce care partner burden.  Equipment: defer to next venue of care  Reasons for discharge: lack of progress toward goals and discharge from hospital  Patient/family agrees with progress made and goals achieved: Yes  OT Discharge Precautions/Restrictions  Precautions Precautions: Fall Precaution Booklet Issued: No Precaution Comments: wound VAC; Restrictions Weight Bearing Restrictions: No  Pain Pain Assessment Pain Score: 4  Pain Type: Surgical pain Pain Location: Back Pain Orientation: Lower Pain Descriptors / Indicators: Aching Pain Onset: On-going Patients Stated Pain Goal: 2 Pain Intervention(s): Repositioned;Heat applied Multiple Pain Sites: No Vision Baseline Vision/History: Wears glasses Wears Glasses: Reading only Cognition Overall Cognitive Status: Within Functional Limits for tasks assessed Arousal/Alertness: Awake/alert Orientation Level: Oriented X4 Sensation Sensation Light Touch: Impaired Detail Light Touch Impaired Details: Impaired RLE;Impaired LLE Proprioception: Appears Intact Coordination Gross Motor Movements are Fluid and Coordinated: No Fine Motor Movements are Fluid and Coordinated: No Motor  Motor Motor - Discharge  Observations: decreased strength bilat LEs and UEs Mobility  Bed Mobility Bed Mobility: Supine to Sit Rolling Right: Minimal Assistance - Patient > 75% Rolling Left: Minimal Assistance - Patient > 75% Supine to Sit: Maximal Assistance - Patient - Patient 25-49%  Trunk/Postural Assessment  Cervical Assessment Cervical Assessment: Within Functional Limits Thoracic Assessment Thoracic Assessment: Exceptions to WFL(kyphotic) Lumbar Assessment Lumbar Assessment: Within Functional Limits Postural Control Postural Control: Deficits on evaluation Trunk Control: improved  Balance Balance Balance Assessed: Yes Static Sitting Balance Static Sitting - Balance Support: Right upper extremity supported;Left upper extremity supported;Feet unsupported Static Sitting - Level of Assistance: 5: Stand by assistance Dynamic Sitting Balance Dynamic Sitting - Balance Support: Feet unsupported;During functional activity;Left upper extremity supported;Right upper extremity supported Dynamic Sitting - Level of Assistance: 5: Stand by assistance Extremity/Trunk Assessment RUE Assessment RUE Assessment: Exceptions to [facility] Active Range of Motion (AROM) Comments: 0-100 shoulder flex/ab, WNL elbow, wrist digit General Strength Comments: improved strength since eval and is functional LUE Assessment LUE Assessment: Exceptions to [facility] Active Range of Motion (AROM) Comments: 0-20 shoulder d/t rotator cuff tear, elbow, wrist and digit WNL exxcept unable to opose little finger to thumb General Strength Comments: increased strength since eval   Bradsher, Katie P 08/05/2018, 11:39 AM

## 2018-08-05 NOTE — Progress Notes (Signed)
Occupational Therapy Session Note  Patient Details  Name: [name] [name] MRN: [id number] Date of Birth: Aug 22, 1956  Today's Date: 08/05/2018 OT Individual Time: 4098-1191 OT Individual Time Calculation (min): 43 min    Short Term Goals: Week 3:  OT Short Term Goal 1 (Week 3): Pt will complete BSC transfer with LRAD and 2 helpers  OT Short Term Goal 2 (Week 3): Pt will complete 1 grooming task in supported standing to improve standing tolerance  OT Short Term Goal 3 (Week 3): Pt will thread 1 LE into pants using reacher with Min A  Skilled Therapeutic Interventions/Progress Updates:    Upon entering the room, pt supine in bed and reports having 3 incontinent episode of BM's this morning. Pt then states, " I think it is happening again." Pt rolling L with use of bed rail and min A and was not incontinent. Pt returning to supine and encouraged to utilize [facility] reacher to thread pants onto B feet. Pt pulling up LEs and front of waist. Pt rolling L <> R with min A and therapist pulling pants up in back. Maxi sky sling placed and pt requesting to be transferred into recliner chair secondary to increased lower back pain. Once in recliner, OT placed k pad behind pt for relief and pt reports 4/10 ache in lower back. RN notified. OT reviewed pt's therapy schedule with him and encouraged participation today. Call bell and all needs within reach.   Therapy Documentation Precautions:  Precautions Precautions: Fall Precaution Booklet Issued: No Precaution Comments: wound VAC; Restrictions Weight Bearing Restrictions: No Pain: Pain Assessment Pain Score: 4  Pain Type: Surgical pain Pain Location: Back Pain Orientation: Lower Pain Descriptors / Indicators: Aching Pain Onset: On-going Patients Stated Pain Goal: 2 Pain Intervention(s): Repositioned;Heat applied Multiple Pain Sites: No Vision Baseline Vision/History: Wears glasses Wears Glasses: Reading only   Therapy/Group: Individual  Therapy  Alen Bleacher 08/05/2018, 11:43 AM

## 2018-08-05 NOTE — Progress Notes (Signed)
Social Work Patient ID: [name] [name], male   DOB: March 31, 1956, 62 y.o.   MRN: [id number]   CSW met with pt and spoke to his wife via telephone to update them on team conference discussion and SNF process.  At the time of this writing, pt has bed offers and his chosen facility is pursuing insurance authorization.  CSW is continuing to follow up on this and hope for pt to be transferred to SNF in the next couple of days.  CSW remains available to assist as needed.

## 2018-08-05 NOTE — Progress Notes (Signed)
Occupational Therapy Session Note  Patient Details  Name: [name] [name] MRN: [id number] Date of Birth: [birth year]  Today's Date: 08/05/2018 OT Individual Time: 1400-1430 OT Individual Time Calculation (min): 30 min    Short Term Goals: Week 2:  OT Short Term Goal 1 (Week 2): Pt will transfer to w/c with Max A of 2 in prep for [facility] transfer  OT Short Term Goal 1 - Progress (Week 2): Not met OT Short Term Goal 2 (Week 2): Pt will complete 1/3 components of donning pants with AE as needed OT Short Term Goal 2 - Progress (Week 2): Not met OT Short Term Goal 3 (Week 2): Pt will don footwear with Mod A using AE OT Short Term Goal 3 - Progress (Week 2): Not met Week 3:  OT Short Term Goal 1 (Week 3): Pt will complete BSC transfer with LRAD and 2 helpers  OT Short Term Goal 2 (Week 3): Pt will complete 1 grooming task in supported standing to improve standing tolerance  OT Short Term Goal 3 (Week 3): Pt will thread 1 LE into pants using reacher with Min A Week 4:     Skilled Therapeutic Interventions/Progress Updates:    1:1 Pt in bed with heat on right shoulder when arrived. Focus on coming to EOB with bed deflated requiring max A (A for trunk and bilateral LEs.). Left AFO and shoes donned for support with total A. SARA Plus used for sit to stands and standing balance/ tolerance. Pt performed 5 sit to stands with SARA plus with walking sling for hip support. Pt able to advance to unweighting each foot and moving heels to advancing foot slightly forward and back!! Transitioned back into bed scooting up to EOB with max A and then back into supine with mod A.   Therapy Documentation Precautions:  Precautions Precautions: Fall Precaution Booklet Issued: No Precaution Comments: wound VAC; Restrictions Weight Bearing Restrictions: No Pain: Ongoing back pain- reports able to do therapy session just wants to lay back down after session due to pain and to rest  Therapy/Group: Individual  Therapy  [name] [facility] 08/05/2018, 3:17 PM

## 2018-08-05 NOTE — Consult Note (Signed)
WOC Nurse wound follow up NPWT, VAC dressing applied in [facility] 4W15.  Spouse present.  Saline moistened gauze removed to place VAC dressing. Wound type: Surgical Measurement: 6.7 cm x 1.7 cm x 3.5cm Wound bed: 100% pink, clean, granulation tissue Drainage (amount, consistency, odor) minimal serous on gauze dressing Periwound: Some fluid filled bullae to right side along the perimeter of the prior drape, otherwise, normal color and texture. Dressing procedure/placement/frequency: MWF VAC dressing by WOC team.   Helmut Muster, RN, MSN, CWOCN, CNS-BC, pager [phone]

## 2018-08-05 NOTE — Progress Notes (Signed)
Physical Therapy Session Note  Patient Details  Name: Jamani Bearce MRN: [id number] Date of Birth: 1956/01/24  Today's Date: 08/05/2018 PT Individual Time: 1100-1200 AND 1600-1700   60 min and 60 min   Short Term Goals: Week 3:  PT Short Term Goal 1 (Week 3): pt will perform functional transfers with max A of 1 PT Short Term Goal 1 - Progress (Week 3): Progressing toward goal PT Short Term Goal 2 (Week 3): pt will perform rolling in bed with min A PT Short Term Goal 2 - Progress (Week 3): Met Week 4:  PT Short Term Goal 1 (Week 4): = LTG  Skilled Therapeutic Interventions/Progress Updates:  Session 1.  Pt received sitting in recliner and agreeable to PT  Sit>stand in [place]. Lateral weight shifting to place standing lift under pt. WC placed under pt once in standing position.   Seated therex LAQ x10, hip abduction x 10, ankle PF x 15, hip flexion x 10, hip extension x 10. All completed with AAROM from PT to improve ROM and and muscle recruitment.   WC mobility instructed by PT with min-supervision assist x 38f, 261fand 7529fModerate cues for improved sitting posture and equal force of BUE to maintain straight path.   Session 2.   Pt received supine in bed and agreeable to PT. Supine>sit transfer with mod assist and min cues for use of bed rails. SB transfer to WC [facility] the L with mod assist +2 for safety. PT blocking the RLE to improve WB through LE and stabilize pt on SB.   Pt transported to day room. UBE 3, level 9 3 min forward/3 min reverse with small rest break between. Min cues for proper speed throughout from PT to improve cardiovascular endurance.   Sit<>stand in sara plus x 4 followed by standing tolerance up to 2 min. Pt attempted to perform reaching task but unable to left elbow from forearm pad.   WC mobility instructed by PT with supervision assist x 75 ft with cues for posture in WC.   Pt returned to room and performed maxisky transfer to bed. Pt left supine in bed with  call bell in reach and all needs met.       Therapy Documentation Precautions:  Precautions Precautions: Fall Precaution Booklet Issued: No Precaution Comments: wound VAC; Restrictions Weight Bearing Restrictions: No Pain: Pain Assessment Pain Score: 0-No pain    Therapy/Group: Individual Therapy  AusLorie Phenix/23/2019, 11:32 AM

## 2018-08-05 NOTE — Progress Notes (Signed)
Physical Therapy Session Note  Patient Details  Name: [name] [name] MRN: [id number] Date of Birth: [birth year]  Time: 830-840 10 minutes  Skilled Therapeutic Interventions/Progress Updates:    Pt initially refused therapy, stating "i'm leaving today, I'm not doing anything".  Pt agreeable to strength and sensation testing. Pt left in recliner with needs at hand.  Missed 50 minutes skilled PT  Therapy Documentation Precautions:  Precautions Precautions: Fall Precaution Booklet Issued: No Precaution Comments: wound VAC; Restrictions Weight Bearing Restrictions: No General:  50 minutes missed PT time due to pt refusal Pain: Pain Assessment Pain Score: 0-No pain   Therapy/Group: Individual Therapy  [name] 08/04/18, 10:31 AM

## 2018-08-06 ENCOUNTER — Inpatient Hospital Stay (HOSPITAL_COMMUNITY): Payer: No Typology Code available for payment source

## 2018-08-06 ENCOUNTER — Inpatient Hospital Stay (HOSPITAL_COMMUNITY): Payer: No Typology Code available for payment source | Admitting: Physical Therapy

## 2018-08-06 MED ORDER — AMLODIPINE BESYLATE 10 MG PO TABS
10.0000 mg | ORAL_TABLET | Freq: Every day | ORAL | Status: DC
  Administered 2018-08-07: 10 mg via ORAL
  Filled 2018-08-06: qty 1

## 2018-08-06 NOTE — Progress Notes (Signed)
[place] PHYSICAL MEDICINE & REHABILITATION PROGRESS NOTE   Subjective/Complaints:  No new complaints  ROS: Patient denies fever, rash, sore throat, blurred vision, nausea, vomiting, diarrhea, cough, shortness of breath or chest pain,   headache, or mood change.    Objective:   No results found. Recent Labs    08/04/18 0456  WBC 6.7  HGB 7.6*  HCT 25.1*  PLT 345   Recent Labs    08/04/18 0456  NA 136  K 3.4*  CL 105  CO2 25  GLUCOSE 95  BUN 6  CREATININE 0.57*  CALCIUM 8.3*    Intake/Output Summary (Last 24 hours) at 08/06/2018 1001 Last data filed at 08/06/2018 0827 Gross per 24 hour  Intake 600 ml  Output 1325 ml  Net -725 ml     Physical Exam: Vital Signs Blood pressure 140/87, pulse 83, temperature 98 F (36.7 C), resp. rate 18, height 5\' 10"  (1.778 m), weight 123.5 kg, SpO2 99 %.  Constitutional: No distress . Vital signs reviewed. obese HEENT: EOMI, oral membranes moist Neck: supple Cardiovascular: RRR without murmur. No JVD    Respiratory: CTA Bilaterally without wheezes or rales. Normal effort    GI: BS +, non-tender, non-distended  Musc: LBP, left shoulder ROM improved. Left knee tender Neurological: He isalertand oriented  Motor:  5/5 in the right deltoid, bicep, tricep, grip  2+/5 right HE, KE, ADF stable 2- to 2/5 LLE stable---no motor change Skin:vac in place  Psych:pleasant and cooperative.   Assessment/Plan: 1. Functional deficits secondary to thoracic SCI, septic arthritis which require 3+ hours per day of interdisciplinary therapy in a comprehensive inpatient rehab setting.  Physiatrist is providing close team supervision and 24 hour management of active medical problems listed below.  Physiatrist and rehab team continue to assess barriers to discharge/monitor patient progress toward functional and medical goals  Care Tool:  Bathing    Body parts bathed by patient: Left arm, Chest, Abdomen, Face, Right arm, Right upper  leg, Left upper leg   Body parts bathed by helper: Front perineal area, Buttocks, Right lower leg, Left lower leg     Bathing assist Assist Level: Moderate Assistance - Patient 50 - 74%(per staff report)     Upper Body Dressing/Undressing Upper body dressing Upper body dressing/undressing activity did not occur (including orthotics): Safety/medical concerns What is the patient wearing?: Pull over shirt    Upper body assist Assist Level: Set up assist    Lower Body Dressing/Undressing Lower body dressing      What is the patient wearing?: Pants     Lower body assist Assist for lower body dressing: Moderate Assistance - Patient 50 - 74%     Toileting Toileting Toileting Activity did not occur Press photographer and hygiene only): Safety/medical concerns  Toileting assist Assist for toileting: 2 Helpers     Transfers Chair/bed transfer  Transfers assist     Chair/bed transfer assist level: Dependent - mechanical lift     Locomotion Ambulation   Ambulation assist   Ambulation activity did not occur: Safety/medical concerns          Walk 10 feet activity   Assist           Walk 50 feet activity   Assist           Walk 150 feet activity   Assist           Walk 10 feet on uneven surface  activity   Assist Walk 10 feet on uneven  surfaces activity did not occur: Safety/medical concerns         Wheelchair     Assist Will patient use wheelchair at discharge?: Yes Type of Wheelchair: Manual Wheelchair activity did not occur: Safety/medical concerns  Wheelchair assist level: Minimal Assistance - Patient > 75% Max wheelchair distance: 50    Wheelchair 50 feet with 2 turns activity    Assist    Wheelchair 50 feet with 2 turns activity did not occur: Safety/medical concerns   Assist Level: Minimal Assistance - Patient > 75%   Wheelchair 150 feet activity     Assist Wheelchair 150 feet activity did not occur:  Safety/medical concerns   Assist Level: Supervision/Verbal cueing    Medical Problem List and Plan: 1.Decreased functional mobilitysecondary to left C5 radiculopathy,  thoracic cord contusion with myelopathy/chronic radiculopathy-status post lumbar wound debridement drainage irrigation 9/17/2019with wound VAC placement, septic arthritis left knee MSSA bacteremia             - SNF pending 2. DVT Prophylaxis/Anticoagulation: Subcutaneous Lovenox.             -dopplers negative 3. Pain Management:Robaxin and hydrocodone as needed-     -  hydrocodone   7.5mg  q4 prn               -continue ms contin 15mg  q12   -reasonable control at present 4. Mood:Cymbalta 60 mg daily. Provide emotional support 5. Neuropsych: This patientiscapable of making decisions on hisown behalf. 6. Skin/Wound Care: wound closing             -WOC RN assisting with vac  -ortho following 7. Fluids/Electrolytes/Nutrition: push po           -I personally reviewed the patient's labs today.      8.ID/MSSA bacteremia. IV Ancef 2 g every 8 hours through 08/11/2018.   Follow-up infectious disease.  9.Septic arthritis left knee. Status post arthroscopy irrigation and drainage, no swelling or pain with range of motion 10.Left shoulder full-thickness retracted supraspinatus tendon tear. Conservative care             - improvement in left shoulder pain s/p steroid injection 10/3   11.Acute on chronic anemia. Normocytic  -Continue ferrous gluconate  -Hemoglobin holding at 7.6  10/22             -stool OB negative x2             -continue serial CBC's 12.BPH/urinary retention.  Flomax 0.4 mg daily, I/O cath prn 13.Bowels: stool more loose  -likely antibiotic effect             -Continue probiotic, increase fiber 13.  HTN       Vitals:   08/05/18 2123 08/06/18 0550  BP: (!) 144/107 140/87  Pulse: 99 83  Resp: 18 18  Temp: 98.5 F (36.9 C) 98 F (36.7 C)  SpO2: 98% 99%    -increase  norvasc to 10mg  daily for better control  14.  Hypokalemia             Potassium 3.4 10/23--continue supp                    LOS: 28 days A FACE TO FACE EVALUATION WAS PERFORMED  Ranelle Oyster 08/06/2018, 10:01 AM

## 2018-08-06 NOTE — Progress Notes (Signed)
Occupational Therapy Session Note  Patient Details  Name: [name] [name] MRN: [id number] Date of Birth: May 10, 1956  Today's Date: 08/06/2018 OT Individual Time: 1100-1145 and 1300-1355 OT Individual Time Calculation (min): 45 min (pt missed 15 mins 2/2 pain) and 55 min   Short Term Goals: Week 3:  OT Short Term Goal 1 (Week 3): Pt will complete BSC transfer with LRAD and 2 helpers  OT Short Term Goal 2 (Week 3): Pt will complete 1 grooming task in supported standing to improve standing tolerance  OT Short Term Goal 3 (Week 3): Pt will thread 1 LE into pants using reacher with Min A  Skilled Therapeutic Interventions/Progress Updates:    Session 1: Pt sitting in w/c upon arrival, grimacing 2/2 low back pain, pt stated he had notified RN about meds and awaiting.  Pt engaged in seated BUE activities tossing ball and hitting ball with 2# weight bar but pt continued to escalate.  Pt requested to return to bed.  Pt returned to bed with MaxiSky and Kpad applied at lowe back.  Pt remained in bed with all needs within reach.  Session 2: Pt resting in bed upon arrival, stating that he was much better now and pain was alleviated.  He was "ready to go." Pt tranfserred to w/c with MaxiSky and transtioned to gym. Pt engaged in standing tasks with [place] Dec Plus with focus on weight shifts and activity tolerance.  Pt stood X 3 (10 mins, 5 mins, and 3 mins). Pt returned to room and remained in w/c with all needs within reach.   Therapy Documentation Precautions:  Precautions Precautions: Fall Precaution Booklet Issued: No Precaution Comments: wound VAC; Restrictions Weight Bearing Restrictions: No General: General OT Amount of Missed Time: 15 Minutes Vital Signs:   Pain:  Sessioon 1: 10/10 low back pain; RN admin meds and repositioned; heat applied Session 2: 2/10 low back pain; repositioned and activity Other Treatments:     Therapy/Group: Individual Therapy  [name] 08/06/2018,  2:01 PM

## 2018-08-06 NOTE — Progress Notes (Signed)
Physical Therapy Session Note  Patient Details  Name: [name] [name] MRN: [id number] Date of Birth: [birth year]  Today's Date: 08/06/2018 PT Individual Time: 0901-1000 PT Individual Time Calculation (min): 59 min   Short Term Goals: Week 3:  PT Short Term Goal 1 (Week 3): pt will perform functional transfers with max A of 1 PT Short Term Goal 1 - Progress (Week 3): Progressing toward goal PT Short Term Goal 2 (Week 3): pt will perform rolling in bed with min A PT Short Term Goal 2 - Progress (Week 3): Met Week 4:  PT Short Term Goal 1 (Week 4): = LTG  Skilled Therapeutic Interventions/Progress Updates:  Session 1 Pt received supine in bed and agreeable to PT. Pt noted to be incontinent of bowel. Rolling R and L with min-mod assist from PT for PT to perform pericare. PT assisted pt in donning pants with mod-max assist while supine in bed using rolling technique.   Maxi sky transfer to [facility]. PT instructed pt in WC mobility x 14f with supervision assist and min cues for posture in WC to improve biomechanics and redice shoulder pain  SB transfer to the R and the L with max assist +2 for safety. Mod multimodal cues for improved anterior weight shift to prevent slide of transfer board and awareness and BLE  Seated UE therex/ sitting balance EOB.  Press ups 2 x 6 Chest press to hit ball to PT with 2# bar weight 2 x 1 min Mid rows with level 2 tband. 2 x 15.  Min cues from PT for improved postural control to prevent posterior LOB and decreased activation of upper traps to improve shoulder mechanics.   [facility] to [facility] and all needs met.    Session 2.  Pt received supine in bed and reports that he is too fatigued and his back hurts too much for additional therapy (6-7/10 in low back). Pt left in bed with call bell in reach.        Therapy Documentation Precautions:  Precautions Precautions: Fall Precaution Booklet Issued:  No Precaution Comments: wound VAC; Restrictions Weight Bearing Restrictions: No General:   Missed time: 30 min (fatigue/pain) Pain: Pain Assessment Pain Score: 5  Pain Location: Shoulder Pain Orientation: Right Pain Descriptors / Indicators: Aching;Nagging Pain Onset: Gradual Patients Stated Pain Goal: 0 Pain Intervention(s): Medication (See eMAR) Multiple Pain Sites: No PAINAD (Pain Assessment in Advanced Dementia) Breathing: normal Negative Vocalization: none Facial Expression: smiling or inexpressive Body Language: relaxed Consolability: no need to console PAINAD Score: 0 Mobility:   Locomotion :    Trunk/Postural Assessment :    Balance:   Exercises:   Other Treatments:      Therapy/Group: Individual Therapy  ALorie Phenix10/24/2019, 9:58 AM

## 2018-08-07 ENCOUNTER — Inpatient Hospital Stay (HOSPITAL_COMMUNITY): Payer: No Typology Code available for payment source | Admitting: Occupational Therapy

## 2018-08-07 ENCOUNTER — Inpatient Hospital Stay (HOSPITAL_COMMUNITY): Payer: No Typology Code available for payment source | Admitting: Physical Therapy

## 2018-08-07 MED ORDER — CALCIUM POLYCARBOPHIL 625 MG PO TABS: 625.0000 mg | ORAL_TABLET | Freq: Two times a day (BID) | ORAL | Status: DC

## 2018-08-07 MED ORDER — HEPARIN SOD (PORK) LOCK FLUSH 100 UNIT/ML IV SOLN
250.0000 [IU] | INTRAVENOUS | Status: AC | PRN
  Administered 2018-08-07: 250 [IU]

## 2018-08-07 MED ORDER — AMLODIPINE BESYLATE 10 MG PO TABS: 10.0000 mg | ORAL_TABLET | Freq: Every day | ORAL | Status: DC

## 2018-08-07 NOTE — Progress Notes (Signed)
Discontinued wound vac dressing per order and redressed back wound with w-D dressing. tol procedure well.

## 2018-08-07 NOTE — Progress Notes (Signed)
Physical Therapy Note  Patient Details  Name: Camari Wisham MRN: [id number] Date of Birth: [birth year] Today's Date: 08/07/2018    Pt received in recliner, declining participation in therapy 2/2 8/10 back pain. Therapist provided various options (transfer to w/c, BUE/BLE exercises) but pt continued to decline. Pt left in recliner with all needs in reach.   Sandi Mariscal 08/07/2018, 10:42 AM

## 2018-08-07 NOTE — Consult Note (Signed)
WOC Nurse wound consult note Primary RN called to say the patient is being discharged today.  I have entered orders to remove the VAC dressing and place a saline moistened gauze prior to discharge.  Helmut Muster, RN, MSN, CWOCN, CNS-BC, pager [phone]

## 2018-08-07 NOTE — Progress Notes (Signed)
Social Work Patient ID: [name] [name], male   DOB: Nov 30, 1955, 62 y.o.   MRN: [id number]   Pt has bed offer and insurance authorization to transfer to [facility].  He will transfer via [facility] once room at SNF is ready.

## 2018-08-07 NOTE — Progress Notes (Signed)
Report called to [facility]. States and understanding of information provided. Pt aware of discharge to SNF awaiting [facility] transport. [name] [name]

## 2018-08-07 NOTE — Progress Notes (Signed)
Occupational Therapy Session Note  Patient Details  Name: [name] [name] MRN: [id number] Date of Birth: October 19, 1955  Today's Date: 08/07/2018 OT Individual Time: 4098-1191 OT Individual Time Calculation (min): 55 min   Skilled Therapeutic Interventions/Progress Updates:    Pt greeted in bed and agreeable to tx. Started session with LB dressing bedlevel. Pt able to thread L LE into pants with increased time using reacher! Rolling Rt>Lt completed with Mod A for elevating pants over hips. Maxi sky transfer completed to bariatric recliner, where pt donned his shirt with setup. He declined standing using Sara plus, instead opting to complete seated LE exercises to work on LE mobility for LB self care. Pt instructed through towel slides exercises bilaterally with focus on knee flexion/extension and hip abduction/adduction. To incorporate UE strengthening, also guided pt through [facility] for hips and ankles using leg lifter with LEs elevated. He required multiple rest breaks due to fatigue. At end of session pt was repositioned for comfort and left with all needs within reach.   Therapy Documentation Precautions:  Precautions Precautions: Fall Precaution Booklet Issued: No Precaution Comments: wound VAC; Restrictions Weight Bearing Restrictions: No Pain: C/o back pain, premedicated. K pad applied to lower back for pain mgt at end of session  Pain Assessment Pain Scale: 0-10 Pain Score: 4  Pain Location: Back Pain Orientation: Mid;Lower Pain Descriptors / Indicators: Aching Patients Stated Pain Goal: 1 Pain Intervention(s): Medication (See eMAR) ADL:       Therapy/Group: Individual Therapy  Reyann Troop A Zarea Diesing 08/07/2018, 12:12 PM

## 2018-08-07 NOTE — Progress Notes (Signed)
[place] PHYSICAL MEDICINE & REHABILITATION PROGRESS NOTE   Subjective/Complaints:  To SNF today. No new complaints.   ROS: Patient denies fever, rash, sore throat, blurred vision, nausea, vomiting, diarrhea, cough, shortness of breath or chest pain,  headache, or mood change.     Objective:   No results found. No results for input(s): WBC, HGB, HCT, PLT in the last 72 hours. No results for input(s): NA, K, CL, CO2, GLUCOSE, BUN, CREATININE, CALCIUM in the last 72 hours.  Intake/Output Summary (Last 24 hours) at 08/07/2018 0931 Last data filed at 08/07/2018 0700 Gross per 24 hour  Intake 240 ml  Output 950 ml  Net -710 ml     Physical Exam: Vital Signs Blood pressure 110/64, pulse 89, temperature 97.9 F (36.6 C), temperature source Oral, resp. rate 18, height 5\' 10"  (1.778 m), weight 123.5 kg, SpO2 100 %.  Constitutional: No distress . Vital signs reviewed. HEENT: EOMI, oral membranes moist Neck: supple Cardiovascular: RRR without murmur. No JVD    Respiratory: CTA Bilaterally without wheezes or rales. Normal effort    GI: BS +, non-tender, non-distended  Musc: LBP, left shoulder ROM improved. Left knee tender Neurological: He isalertand oriented  Motor:  5/5 in the right deltoid, bicep, tricep, grip  2+/5 right HE, KE, ADF stable 2- to 2/5 LLE stable---no motor change Skin:vac in place  Psych:pleasant and cooperative.   Assessment/Plan: 1. Functional deficits secondary to thoracic SCI, septic arthritis which require 3+ hours per day of interdisciplinary therapy in a comprehensive inpatient rehab setting.  Physiatrist is providing close team supervision and 24 hour management of active medical problems listed below.  Physiatrist and rehab team continue to assess barriers to discharge/monitor patient progress toward functional and medical goals  Care Tool:  Bathing    Body parts bathed by patient: Left arm, Chest, Abdomen, Face, Right arm, Right upper leg,  Left upper leg   Body parts bathed by helper: Front perineal area, Buttocks, Right lower leg, Left lower leg     Bathing assist Assist Level: Moderate Assistance - Patient 50 - 74%(per staff report)     Upper Body Dressing/Undressing Upper body dressing Upper body dressing/undressing activity did not occur (including orthotics): Safety/medical concerns What is the patient wearing?: Pull over shirt    Upper body assist Assist Level: Set up assist    Lower Body Dressing/Undressing Lower body dressing      What is the patient wearing?: Pants     Lower body assist Assist for lower body dressing: Moderate Assistance - Patient 50 - 74%     Toileting Toileting Toileting Activity did not occur Press photographer and hygiene only): Safety/medical concerns  Toileting assist Assist for toileting: 2 Helpers     Transfers Chair/bed transfer  Transfers assist     Chair/bed transfer assist level: Dependent - mechanical lift     Locomotion Ambulation   Ambulation assist   Ambulation activity did not occur: Safety/medical concerns          Walk 10 feet activity   Assist           Walk 50 feet activity   Assist           Walk 150 feet activity   Assist           Walk 10 feet on uneven surface  activity   Assist Walk 10 feet on uneven surfaces activity did not occur: Safety/medical concerns         Wheelchair  Assist Will patient use wheelchair at discharge?: Yes Type of Wheelchair: Manual Wheelchair activity did not occur: Safety/medical concerns  Wheelchair assist level: Supervision/Verbal cueing Max wheelchair distance: 25ft    Wheelchair 50 feet with 2 turns activity    Assist    Wheelchair 50 feet with 2 turns activity did not occur: Safety/medical concerns   Assist Level: Supervision/Verbal cueing   Wheelchair 150 feet activity     Assist Wheelchair 150 feet activity did not occur: Safety/medical  concerns   Assist Level: Supervision/Verbal cueing    Medical Problem List and Plan: 1.Decreased functional mobilitysecondary to left C5 radiculopathy,  thoracic cord contusion with myelopathy/chronic radiculopathy-status post lumbar wound debridement drainage irrigation 9/17/2019with wound VAC placement, septic arthritis left knee MSSA bacteremia             - SNF today  -I'll see him back in 4-6 weeks  -needs ortho, ID follow up 2. DVT Prophylaxis/Anticoagulation: Subcutaneous Lovenox.             -dopplers negative 3. Pain Management:Robaxin  as needed-     -  hydrocodone   7.5mg  q4 prn               -continue ms contin 15mg  q12     4. Mood:Cymbalta 60 mg daily. Provide emotional support 5. Neuropsych: This patientiscapable of making decisions on hisown behalf. 6. Skin/Wound Care: wound closing             -WOC RN assisting with vac  -ortho following 7. Fluids/Electrolytes/Nutrition: push po                8.ID/MSSA bacteremia. IV Ancef 2 g every 8 hours through 08/11/2018.   Follow-up infectious disease.  9.Septic arthritis left knee. Status post arthroscopy irrigation and drainage, no swelling or pain with range of motion 10.Left shoulder full-thickness retracted supraspinatus tendon tear. Conservative care             - improvement in left shoulder pain s/p steroid injection 10/3   11.Acute on chronic anemia. Normocytic  -Continue ferrous gluconate  -Hemoglobin holding at 7.6  10/22             -stool OB negative x2             -continue serial CBC's 12.BPH/urinary retention.  Flomax 0.4 mg daily, I/O cath prn 13.Bowels: stool more loose  -likely antibiotic effect             -Continue probiotic, increase fiber 13.  HTN       Vitals:   08/06/18 2056 08/07/18 0518  BP: 123/68 110/64  Pulse: 92 89  Resp: 17 18  Temp: 98 F (36.7 C) 97.9 F (36.6 C)  SpO2: 100% 100%    -increase norvasc to 10mg  daily with improved control  14.   Hypokalemia             Potassium 3.4 10/23--continue supp                    LOS: 29 days A FACE TO FACE EVALUATION WAS PERFORMED  Ranelle Oyster 08/07/2018, 9:31 AM

## 2018-08-07 NOTE — Progress Notes (Signed)
Physical Therapy Note  Patient Details  Name: [name] [name] MRN: [id number] Date of Birth: 09/26/1956 Today's Date: 08/07/2018    Pt received in recliner but declining participation in therapy reporting he's not going to do any therapy today. Pt left in recliner with all needs in reach.   Sandi Mariscal 08/07/2018, 1:10 PM

## 2018-08-09 ENCOUNTER — Inpatient Hospital Stay (HOSPITAL_COMMUNITY): Payer: No Typology Code available for payment source

## 2018-08-10 ENCOUNTER — Non-Acute Institutional Stay (SKILLED_NURSING_FACILITY): Payer: No Typology Code available for payment source | Admitting: Internal Medicine

## 2018-08-10 ENCOUNTER — Encounter: Payer: Self-pay | Admitting: Internal Medicine

## 2018-08-10 DIAGNOSIS — N179 Acute kidney failure, unspecified: Secondary | ICD-10-CM

## 2018-08-10 DIAGNOSIS — D638 Anemia in other chronic diseases classified elsewhere: Secondary | ICD-10-CM

## 2018-08-10 DIAGNOSIS — G061 Intraspinal abscess and granuloma: Secondary | ICD-10-CM | POA: Diagnosis not present

## 2018-08-10 DIAGNOSIS — Z9189 Other specified personal risk factors, not elsewhere classified: Secondary | ICD-10-CM

## 2018-08-10 DIAGNOSIS — R338 Other retention of urine: Secondary | ICD-10-CM | POA: Diagnosis not present

## 2018-08-10 DIAGNOSIS — M00062 Staphylococcal arthritis, left knee: Secondary | ICD-10-CM | POA: Diagnosis not present

## 2018-08-10 NOTE — Patient Instructions (Addendum)
See assessment and plan under each diagnosis in the problem list and acutely for this visit Total time 52  minutes; greater than 50% of the visit spent counseling patient and coordinating care for problems addressed at this encounter  

## 2018-08-10 NOTE — Progress Notes (Signed)
NURSING HOME LOCATION:  [facility] ROOM NUMBER:  [id number]  CODE STATUS:  Full Code  PCP:  Renford Dills, MD  [address] [place] [address]  This is a comprehensive admission note to [facility] performed on this date less than 30 days from date of admission. Included are preadmission medical/surgical history; reconciled medication list; family history; social history and comprehensive review of systems.  Corrections and additions to the records were documented. Comprehensive physical exam was also performed. Additionally a clinical summary was entered for each active diagnosis pertinent to this admission in the Problem List to enhance continuity of care.  HPI: Patient was discharged from physical medicine 08/07/2018 with diagnoses of left C5 radiculopathy, thoracic cord contusion with myelopathy, chronic radiculopathy, status post lumbar wound debridement and drainage, status post irrigation 9/16 with wound VAC placement.  Additional diagnosis included septic arthritis in the left knee with methicillin sensitive staph aureus bacteremia.  Left shoulder full-thickness retracted supraspinatus tendon tear is also present. Subcutaneous Lovenox was continued for deep venous thrombosis prophylaxis. He was originally admitted 06/28/2018 with progressive lower extremity weakness with recurrent falls.  He lives with his spouse but required assistance with ADLs.  He had a history of lumbar multilevel central laminectomy and recurrent lumbar spinal stenosis with multiple back surgeries.  These were complicated by foot drop. MRI revealed subcentimeter cord edema at T7-T8.  Moderate to severe paraspinal muscle atrophy was present.  Multilevel neural foraminal narrowing was present with severe findings at T2-3, T9-T10, and T10-T 11 with a 2.9 x 4.3 x 6.1 fluid collection.  Lumbar wound debridement with drainage and irrigation was accomplished 9/16 by Dr. Otelia Sergeant. Left knee was  aspirated for clinical septic arthritis.  Staph aureus grew from the aspirate from the lumbar abscess.  Infectious disease consulted.  TEE revealed no vegetations.  IV Ancef was initiated through 10/29.  Nephrology consulted for creatinine up to 2.43.  Renal ultrasound revealed no hydronephrosis.  Creatinine did respond to gentle IV fluid replacement. The patient complained about left shoulder pain, MRI revealed the full-thickness retracted supraspinatus tendon tear.  Conservative care was recommended. MRI of the cervical spine was completed because of neck pain and left biceps weakness.  This showed neural foraminal narrowing at C2-C3 through C6-C7.  These findings were severe from C3-C4 through C6-C7.   The patient complained of left wrist pain, imaging revealed extensive soft tissue swelling over the dorsum of the wrist without associated fracture. Wound care was to be continued for the lumbar incision with wound VAC dressing change.  Hospital course was complicated by intermittent urinary retention, Flomax was initiated.  Past medical and surgical history: Includes obstructive sleep apnea (previosly on CPAP), osteoporosis, morbid obesity, essential hypertension, history of renal calculi, and chronic anemia.He has had pruritic dermatitis which is inacvtive @ present.   He has had stents placed for renal calculi.He has also had cholecystectomy in addition to his multiple spinal surgeries.  Social history: Nondrinker, former smoker (age 42 only)  Family history: Reviewed   Review of systems: He requested in and out catheterization for urinary retention; 750 cc of amber-colored urine was obtained. He has not been on CPAP for at least 5 years.  His wife states that he does snore loudly and occasionally does exhibit apnea. He has symptoms of peripheral neuropathy with numbness & tingling.  Constitutional: No fever, significant weight change  Eyes: No redness, discharge, pain, vision  change ENT/mouth: No nasal congestion, purulent discharge, earache,  change in hearing, sore throat  Cardiovascular: No chest pain, palpitations, paroxysmal nocturnal dyspnea, claudication, edema  Respiratory: No cough, sputum production, hemoptysis, DOE, significant snoring, apnea Gastrointestinal: No heartburn, dysphagia, abdominal pain, nausea /vomiting, rectal bleeding, melena, change in bowels Genitourinary: No dysuria, hematuria, pyuria, incontinence, nocturia Dermatologic: No rash, pruritus, change in appearance of skin Neurologic: No dizziness, headache, syncope, seizures Psychiatric: No significant anxiety, depression, insomnia, anorexia Endocrine: No change in hair/skin/nails, excessive thirst, excessive hunger, excessive urination  Hematologic/lymphatic: No significant bruising, lymphadenopathy, abnormal bleeding Allergy/immunology: No itchy/watery eyes, significant sneezing, urticaria, angioedema  Physical exam:  Pertinent or positive findings: He does have morbid obesity with central predominance.  His hair is shaven, he has pattern alopecia.  He exhibits a slight tachycardia.  Abdomen is protuberant.  Pedal pulses are decreased.  The lower extremities are weaker than the upper extremities.  An IV is present in the right upper extremity.  He has scattered hyperpigmented scarring over the abdomen and extremities.Amateur tatoos present on hands dorsally.  General appearance: Adequately nourished; no acute distress, increased work of breathing is present.   Lymphatic: No lymphadenopathy about the head, neck, axilla. Eyes: No conjunctival inflammation or lid edema is present. There is no scleral icterus. Ears:  External ear exam shows no significant lesions or deformities.   Nose:  External nasal examination shows no deformity or inflammation. Nasal mucosa are pink and moist without lesions, exudates Oral exam: Lips and gums are healthy appearing.There is no oropharyngeal erythema or  exudate. Neck:  No thyromegaly, masses, tenderness noted.    Heart:  No gallop, murmur, click, rub.  Lungs: Chest clear to auscultation without wheezes, rhonchi, rales, rubs. Abdomen: Bowel sounds are normal.  Abdomen is soft and nontender with no organomegaly, hernias, masses. GU: Deferred  Extremities:  No cyanosis, clubbing, edema. Neurologic exam:Balance, Rhomberg, finger to nose testing could not be completed due to clinical state kin: Warm & dry w/o tenting. No significant  rash.  See clinical summary under each active problem in the Problem List with associated updated therapeutic plan

## 2018-08-10 NOTE — Assessment & Plan Note (Signed)
Wound care with wound VAC at SNF

## 2018-08-10 NOTE — Assessment & Plan Note (Signed)
Ancef every 8 hours through 08/11/2018

## 2018-08-10 NOTE — Assessment & Plan Note (Addendum)
I explained  risk of over 50 % of adverse reaction or possibly death if opioids are continued long-term.  Tylenol & NSAIDS possibly option now off Lovenox DVT prophylaxis ;but severe anemia problematic

## 2018-08-10 NOTE — Progress Notes (Signed)
Social Work Discharge Note  The overall goal for the admission was met for:   Discharge location: No - required transfer to [facility] (SNF)  Length of Stay: Yes - 29 days  Discharge activity level: No - Pt was wheelchair level, total assist.  Home/community participation: No  Services provided included: MD, RD, PT, OT, RN, TR, Pharmacy, [place]: Private Insurance: [facility])  Follow-up services arranged: Other: Pt required transfer to SNF for continued rehabilitation.  Comments (or additional information): Pt transferred to SNF via [facility] for continued rehabilitation.  Pt and wife were in agreement and CSW told them that pt has 21 days covered at SNF up to $700/day and would be responsible for any charges over that.  Patient/Family verbalized understanding of follow-up arrangements: Yes  Individual responsible for coordination of the follow-up plan: pt and his wife  Confirmed correct DME delivered: Trey Sailors 08/10/2018    Khyan Oats, Silvestre Mesi

## 2018-08-11 NOTE — Assessment & Plan Note (Addendum)
CBC to assess response to PO iron  Once anemia improved consider NSAIDS as allowed by renal function

## 2018-08-11 NOTE — Assessment & Plan Note (Signed)
Resolved with hydration; creatinine 0.57 pre D/C High was 2.99 Recheck BMET because of recurrent urinary retention

## 2018-08-12 LAB — CBC AND DIFFERENTIAL
HCT: 24 — AB (ref 41–53)
Hemoglobin: 7.9 — AB (ref 13.5–17.5)
NEUTROS ABS: 5
PLATELETS: 389 (ref 150–399)
WBC: 7.1

## 2018-08-12 LAB — BASIC METABOLIC PANEL
BUN: 8 (ref 4–21)
CREATININE: 0.5 — AB (ref 0.6–1.3)
Glucose: 76
POTASSIUM: 3.7 (ref 3.4–5.3)
Sodium: 139 (ref 137–147)

## 2018-08-19 ENCOUNTER — Telehealth (INDEPENDENT_AMBULATORY_CARE_PROVIDER_SITE_OTHER): Payer: Self-pay | Admitting: Specialist

## 2018-08-19 NOTE — Telephone Encounter (Signed)
[name] [name] from [facility] [place] called advised patient need an appointment for post op visit. Patient had surgery in September. The number to contact Gavin Pound is [phone]

## 2018-08-20 ENCOUNTER — Encounter (INDEPENDENT_AMBULATORY_CARE_PROVIDER_SITE_OTHER): Payer: Self-pay | Admitting: Surgery

## 2018-08-20 ENCOUNTER — Telehealth (INDEPENDENT_AMBULATORY_CARE_PROVIDER_SITE_OTHER): Payer: Self-pay

## 2018-08-20 ENCOUNTER — Ambulatory Visit (INDEPENDENT_AMBULATORY_CARE_PROVIDER_SITE_OTHER): Payer: No Typology Code available for payment source | Admitting: Surgery

## 2018-08-20 ENCOUNTER — Telehealth: Payer: Self-pay | Admitting: *Deleted

## 2018-08-20 VITALS — BP 109/68 | HR 94 | Temp 97.6°F | Ht 70.0 in | Wt 272.2 lb

## 2018-08-20 DIAGNOSIS — R29898 Other symptoms and signs involving the musculoskeletal system: Secondary | ICD-10-CM

## 2018-08-20 DIAGNOSIS — Z9889 Other specified postprocedural states: Secondary | ICD-10-CM

## 2018-08-20 NOTE — Progress Notes (Signed)
62 year old black male returns.  Status post lumbar wound I&D.  Patient spent several weeks at [facility] inpatient rehab and is currently doing rehab at [facility] skilled nursing facility.  Patient continues to have weakness in both lower extremities.  Transfers with [employer].  Still has PICC line but has not had follow-up visit with infectious disease.  Exam Patient seen with Dr. Otelia Sergeant today.  We did not remove wound VAC.  Wound has obviously gotten smaller. Patient still has profound weakness of both lower extremities.  Plan Patient will need to have office visit with Dr. Otelia Sergeant next Wednesday so we can remove the wound VAC and inspect the wound.  He will have wound VAC placement by home health that afternoon.  We did contact infectious disease office and will see if we can get patient an appointment for follow-up visit with them to see if PICC line can be removed or if he needs to continue IV antibiotics.  All questions answered.

## 2018-08-20 NOTE — Telephone Encounter (Signed)
Patient has appt

## 2018-08-20 NOTE — Telephone Encounter (Signed)
Thanks

## 2018-08-20 NOTE — Telephone Encounter (Signed)
Patient seen at [facility] today. They asked [facility] to schedule follow up for patient, as he has not yet been seen here.  Patient still with PICC, staying at [facility]. Per ortho, patient still getting IV antibiotics. Front desk scheduled patient for hospital follow up 11/12 with Dr Ninetta Lights.  [facility] and [facility] aware. Andree Coss, RN

## 2018-08-20 NOTE — Telephone Encounter (Signed)
Dr. Ninetta Lights will see pt 08/25/18 FYI

## 2018-08-25 ENCOUNTER — Non-Acute Institutional Stay (SKILLED_NURSING_FACILITY): Payer: No Typology Code available for payment source | Admitting: Adult Health

## 2018-08-25 ENCOUNTER — Encounter: Payer: Self-pay | Admitting: Infectious Diseases

## 2018-08-25 ENCOUNTER — Encounter: Payer: Self-pay | Admitting: Adult Health

## 2018-08-25 ENCOUNTER — Ambulatory Visit (INDEPENDENT_AMBULATORY_CARE_PROVIDER_SITE_OTHER): Payer: No Typology Code available for payment source | Admitting: Infectious Diseases

## 2018-08-25 DIAGNOSIS — R7881 Bacteremia: Secondary | ICD-10-CM

## 2018-08-25 DIAGNOSIS — E876 Hypokalemia: Secondary | ICD-10-CM

## 2018-08-25 DIAGNOSIS — G894 Chronic pain syndrome: Secondary | ICD-10-CM

## 2018-08-25 DIAGNOSIS — F339 Major depressive disorder, recurrent, unspecified: Secondary | ICD-10-CM

## 2018-08-25 DIAGNOSIS — R339 Retention of urine, unspecified: Secondary | ICD-10-CM | POA: Diagnosis not present

## 2018-08-25 DIAGNOSIS — K219 Gastro-esophageal reflux disease without esophagitis: Secondary | ICD-10-CM

## 2018-08-25 DIAGNOSIS — I10 Essential (primary) hypertension: Secondary | ICD-10-CM | POA: Diagnosis not present

## 2018-08-25 DIAGNOSIS — G061 Intraspinal abscess and granuloma: Secondary | ICD-10-CM

## 2018-08-25 DIAGNOSIS — N179 Acute kidney failure, unspecified: Secondary | ICD-10-CM

## 2018-08-25 DIAGNOSIS — D62 Acute posthemorrhagic anemia: Secondary | ICD-10-CM

## 2018-08-25 MED ORDER — FERROUS GLUCONATE 324 (38 FE) MG PO TABS: 324.0000 mg | ORAL_TABLET | Freq: Three times a day (TID) | ORAL | 0 refills | Status: DC

## 2018-08-25 MED ORDER — POTASSIUM CHLORIDE CRYS ER 20 MEQ PO TBCR: 20.0000 meq | EXTENDED_RELEASE_TABLET | Freq: Two times a day (BID) | ORAL | 0 refills | Status: DC

## 2018-08-25 MED ORDER — HYDROCODONE-ACETAMINOPHEN 7.5-325 MG PO TABS: 1.0000 | ORAL_TABLET | Freq: Three times a day (TID) | ORAL | 0 refills | Status: DC | PRN

## 2018-08-25 MED ORDER — AMLODIPINE BESYLATE 10 MG PO TABS: 10.0000 mg | ORAL_TABLET | Freq: Every day | ORAL | 0 refills | Status: DC

## 2018-08-25 MED ORDER — TAMSULOSIN HCL 0.4 MG PO CAPS: 0.4000 mg | ORAL_CAPSULE | Freq: Every day | ORAL | 0 refills | Status: DC

## 2018-08-25 MED ORDER — MORPHINE SULFATE ER 15 MG PO TBCR: 15.0000 mg | EXTENDED_RELEASE_TABLET | Freq: Two times a day (BID) | ORAL | 0 refills | Status: DC

## 2018-08-25 MED ORDER — PANTOPRAZOLE SODIUM 40 MG PO TBEC: 40.0000 mg | DELAYED_RELEASE_TABLET | Freq: Two times a day (BID) | ORAL | 0 refills | Status: DC

## 2018-08-25 MED ORDER — DULOXETINE HCL 60 MG PO CPEP: 60.0000 mg | ORAL_CAPSULE | Freq: Every day | ORAL | 0 refills | Status: DC

## 2018-08-25 MED ORDER — CEPHALEXIN 500 MG PO CAPS: 500.0000 mg | ORAL_CAPSULE | Freq: Two times a day (BID) | ORAL | 1 refills | Status: DC

## 2018-08-25 MED ORDER — METHOCARBAMOL 500 MG PO TABS: 500.0000 mg | ORAL_TABLET | Freq: Four times a day (QID) | ORAL | 0 refills | Status: DC | PRN

## 2018-08-25 MED ORDER — SACCHAROMYCES BOULARDII 250 MG PO CAPS: 250.0000 mg | ORAL_CAPSULE | Freq: Two times a day (BID) | ORAL | 0 refills | Status: DC

## 2018-08-25 NOTE — Assessment & Plan Note (Signed)
Will repeat his BCx today 

## 2018-08-25 NOTE — Progress Notes (Signed)
Per verbal order from Dr Ninetta LightsHatcher, 43 cm Single Lumen Peripherally Inserted Central Catheter removed from right basilic, tip intact. No sutures present. RN confirmed length per chart. Dressing was clean and dry. Petroleum dressing applied. Pt advised no heavy lifting with this arm, leave dressing for 24 hours and call the office or seek emergent care if dressing becomes soaked with blood or swelling or sharp pain presents. Patient verbalized understanding and agreement.  Patient's questions answered to their satisfaction. Patient tolerated procedure well, RN brought patient to lab for blood cultures and additional labs. Andree CossHowell, Tryson Lumley M, RN

## 2018-08-25 NOTE — Progress Notes (Addendum)
Location:  [facility] Room Number: 303-A Place of Service:  SNF (31) Provider:  Kenard Gower, NP  Patient Care Team: Renford Dills, MD as PCP - General (Internal Medicine) Himmelrich, Loree Fee, RD (Inactive) as Dietitian [employer])  Extended Emergency Contact Information Primary Emergency Contact: Najjar,[name] Address: [address]          [place], [place] [address] [place] Home Phone: [phone] Mobile Phone: [phone] Relation: Spouse  Code Status:  Full Code  Goals of care: Advanced Directive information Advanced Directives 07/09/2018  Does Patient Have a Medical Advance Directive? Yes  Type of Advance Directive Healthcare Power of Attorney  Does patient want to make changes to medical advance directive? No - Patient declined  Copy of Healthcare Power of Attorney in Chart? No - copy requested  Would patient like information on creating a medical advance directive? No - Patient declined  Pre-existing out of facility DNR order (yellow form or pink MOST form) -     Chief Complaint  Patient presents with  . Discharge Note    Patient to discharge home 08/27/18, is seen for a discharge visit.    HPI:  Pt is a 62 y.o. male seen today for a discharge visit.  He is to discharge home on 08/27/18 with home health OT, PT, and Nursing services.    He has been admitted to [facility] on 08/07/18 from a recent hospitalization due to left C5 radiculopathy, thoracic cord contusion with myelopathy, chronic radiculopathy, status post lumbar pain debridement and drainage, irrigation 06/30/2018 with wound VAC placement and septic arthritis on left knee methicillin sensitive Staphylococcus aureus bacteremia.  He was admitted to the hospital on 06/28/2018 with progressive lower extremity weakness and falls.  MRI thoracic, lumbar spine showed no fracture or malalignment, subcentimeter cord edema T7-T8; moderate to severe  paraspinal muscle atrophy; multilevel neural foraminal narrowing, severe at T2-3, T9-T10 and T10-T11 as well as 2.9 x 4.3 x 6.1 fluid collection.  He underwent lumbar wound debridement, drainage and irrigation on 06/29/2018 as well as aspiration of left knee consistent with septic arthritis.  Aspiration lumbar abscess positive for Staphylococcus aureus.  Infectious disease was consulted.  He was placed on intravenous Ancef per infectious disease through 08/11/2018.  Nephrology was consulted for elevated creatinine 2.43.  He has complaints of left shoulder pain and MRI showed full-thickness retracted supraspinatus tendon tear with current conservative care recommended.  A follow-up MRI cervical spine completed for neck pain as well as left bicep weakness showing neural foraminal narrowing at C2-C3 through C6-C7.  He was started on Flomax due to bouts of urinary retention.  He was seen in his room today. His PICC line on his right upper arm was discontinued today after seeing Infectious disease. He has completed I V Ancef and was started on Keflex. He continues to have wound vac to his mid lower back wound. He was noted to have an open area on the tip of his penile are which is thought to be due to the foley catheter. Instructed patient and wife to keep area clean. Patient verbalized that he cannot urinate without a foley catheter and is not interested in having a trial voiding. A referral to urology, Dr. Isabel Caprice, was done. He has a PMH of OSA, osteoporosis, morbid obesity, essential hypertension, history of renal calculi, and chronic anemia.   Patient was admitted to this facility for short-term rehabilitation after the patient's recent hospitalization.  Patient has completed SNF rehabilitation and  therapy has cleared the patient for discharge.   Past Medical History:  Diagnosis Date  . Anemia   . Arthritis   . Chronic kidney disease    HX acute kidney failure / acute pyelonephritis / hydronephrosis /  severe sepsis per discharge summary 12/27/13  . History of kidney stones   . Hypertension   . Morbid obesity (HCC)   . Obstructive sleep apnea    does not need c pap since 110 lb wt loss  . Osteoporosis   . Prurigo 2002  . Scars    ON ARMS FROM CHEMICAL EXPLOSION 1999  . Spinal stenosis    Past Surgical History:  Procedure Laterality Date  . BACK SURGERY  01/23/2010   lumbar  . CHOLECYSTECTOMY N/A 03/24/2016   Procedure: LAPAROSCOPIC CHOLECYSTECTOMY WITH INTRAOPERATIVE CHOLANGIOGRAM;  Surgeon: Chevis Pretty III, MD;  Location: [facility];  Service: General;  Laterality: N/A;  . CIRCUMCISION    . CYSTOSCOPY WITH RETROGRADE PYELOGRAM, URETEROSCOPY AND STENT PLACEMENT Left 01/20/2014   Procedure: CYSTOSCOPY WITH RETROGRADE PYELOGRAM, URETEROSCOPY AND STENT EXCHANGE;  Surgeon: Milford Cage, MD;  Location: [facility];  Service: Urology;  Laterality: Left;  bugbee bladder fulguration  . CYSTOSCOPY WITH STENT PLACEMENT Left 12/28/2013   Procedure: CYSTOSCOPY WITH STENT PLACEMENT left retrograde pyleogram;  Surgeon: Milford Cage, MD;  Location: [facility];  Service: Urology;  Laterality: Left;  . ESOPHAGOGASTRODUODENOSCOPY N/A 12/07/2012   Procedure: ESOPHAGOGASTRODUODENOSCOPY (EGD);  Surgeon: Lodema Pilot, DO;  Location: [facility];  Service: General;  Laterality: N/A;  . HOLMIUM LASER APPLICATION Left 01/20/2014   Procedure: HOLMIUM LASER APPLICATION;  Surgeon: Milford Cage, MD;  Location: [facility];  Service: Urology;  Laterality: Left;  . KNEE ARTHROSCOPY Left 06/29/2018   Procedure: ARTHROSCOPY KNEE;  Surgeon: Kerrin Champagne, MD;  Location: [facility] OR;  Service: Orthopedics;  Laterality: Left;  . LAPAROSCOPIC GASTRIC SLEEVE RESECTION N/A 12/07/2012   Procedure: LAPAROSCOPIC GASTRIC SLEEVE RESECTION;  Surgeon: Lodema Pilot, DO;  Location: [facility];  Service: General;  Laterality: N/A;  laparoscopic sleeve gastrectomy with EGD  . LUMBAR LAMINECTOMY N/A 11/17/2017   Procedure: L2-3 LAMINECTOMY AND REDO  LAMINECTOMIES  L3-4, L4-5 AND L5-S1;  Surgeon: Kerrin Champagne, MD;  Location: [facility] OR;  Service: Orthopedics;  Laterality: N/A;  . LUMBAR WOUND DEBRIDEMENT N/A 06/29/2018   Procedure: LUMBAR WOUND DEBRIDEMENT DRAINAGE AND IRRIGATION; AND ASPIRATION OF LEFT KNEE;  Surgeon: Kerrin Champagne, MD;  Location: [facility] OR;  Service: Orthopedics;  Laterality: N/A;  . TEE WITHOUT CARDIOVERSION N/A 07/01/2018   Procedure: TRANSESOPHAGEAL ECHOCARDIOGRAM (TEE);  Surgeon: Laurey Morale, MD;  Location: [facility] ENDOSCOPY;  Service: Cardiovascular;  Laterality: N/A;    Allergies  Allergen Reactions  . Unasyn [Ampicillin-Sulbactam Sodium] Hives, Itching, Swelling and Rash    Outpatient Encounter Medications as of 08/25/2018  Medication Sig  . amLODipine (NORVASC) 10 MG tablet Take 1 tablet (10 mg total) by mouth daily.  . bisacodyl (DULCOLAX) 10 MG suppository Place 10 mg rectally daily.  . calcium-vitamin D (OSCAL WITH D) 500-200 MG-UNIT tablet Take 1 tablet by mouth daily.  Marland Kitchen docusate sodium (COLACE) 100 MG capsule Take 100 mg by mouth daily.  . DULoxetine (CYMBALTA) 60 MG capsule TAKE 1 CAPSULE(60 MG) BY MOUTH DAILY  . ferrous gluconate (FERGON) 324 MG tablet Take 1 tablet (324 mg total) by mouth 3 (three) times daily with meals.  Marland Kitchen HYDROcodone-acetaminophen (NORCO) 7.5-325 MG tablet Take 1 tablet by mouth every 4 (four) hours as needed for moderate pain.  Marland Kitchen  methocarbamol (ROBAXIN) 500 MG tablet Take 1 tablet (500 mg total) by mouth every 6 (six) hours as needed for muscle spasms.  Marland Kitchen morphine (MS CONTIN) 15 MG 12 hr tablet Take 1 tablet (15 mg total) by mouth every 12 (twelve) hours.  . Multiple Vitamin (MULTIVITAMIN) tablet Take 1 tablet by mouth daily.   . pantoprazole (PROTONIX) 40 MG tablet Take 1 tablet (40 mg total) by mouth 2 (two) times daily before a meal.  . potassium chloride SA (K-DUR,KLOR-CON) 20 MEQ tablet Take 1 tablet (20 mEq total) by mouth 2 (two) times daily.  . psyllium (METAMUCIL) 58.6 % packet  Take 1 packet by mouth daily.  Marland Kitchen saccharomyces boulardii (FLORASTOR) 250 MG capsule Take 250 mg by mouth 2 (two) times daily.  . tamsulosin (FLOMAX) 0.4 MG CAPS capsule Take 1 capsule (0.4 mg total) by mouth daily.  . vitamin B-12 (CYANOCOBALAMIN) 1000 MCG tablet Take 1,000 mcg by mouth daily.  . [DISCONTINUED] acetaminophen (TYLENOL) 325 MG tablet Take 2 tablets (650 mg total) by mouth every 6 (six) hours as needed for mild pain (or Fever >/= 101).  . [DISCONTINUED] cephALEXin (KEFLEX) 500 MG capsule Take 1 capsule (500 mg total) by mouth 2 (two) times daily.  . [DISCONTINUED] polycarbophil (FIBERCON) 625 MG tablet Take 1 tablet (625 mg total) by mouth daily.  . [DISCONTINUED] saccharomyces boulardii (FLORASTOR) 250 MG capsule Take 1 capsule (250 mg total) by mouth 2 (two) times daily.   No facility-administered encounter medications on file as of 08/25/2018.     Review of Systems  GENERAL: No change in appetite, no fatigue, no weight changes, no fever, or chills  MOUTH and THROAT: Denies oral discomfort, gingival pain or bleeding RESPIRATORY: no cough, SOB, DOE, wheezing, hemoptysis CARDIAC: No chest pain, edema or palpitations GI: No abdominal pain, diarrhea, constipation, heart burn, nausea or vomiting PSYCHIATRIC: Denies feelings of depression or anxiety. No report of hallucinations, insomnia, paranoia, or agitation   Immunization History  Administered Date(s) Administered  . Influenza,inj,Quad PF,6+ Mos 06/29/2018  . Pneumococcal Polysaccharide-23 12/08/2012   Pertinent  Health Maintenance Due  Topic Date Due  . COLONOSCOPY  11/09/2007  . INFLUENZA VACCINE  Completed      Vitals:   08/25/18 1050  BP: 134/81  Pulse: 78  Resp: 18  Temp: 98.4 F (36.9 C)  TempSrc: Oral  SpO2: 99%  Weight: (!) 310 lb 9.6 oz (140.9 kg)  Height: 5\' 10"  (1.778 m)   Body mass index is 44.57 kg/m.  Physical Exam  GENERAL APPEARANCE: Well nourished. In no acute distress.Morbidly  obese SKIN:  Mid lower back wound attached to a wound vac, has open wound on tip of penis MOUTH and THROAT: Lips are without lesions. Oral mucosa is moist and without lesions. Tongue is normal in shape, size, and color and without lesions RESPIRATORY: Breathing is even & unlabored, BS CTAB CARDIAC: RRR, no murmur,no extra heart sounds, no edema GI: Abdomen soft, normal BS, no masses, no tenderness GU:  Has foley catheter attached to urine bag with clear yellowish urine EXTREMITIES:  BLE with generalized weakness, able to move BUE NEUROLOGICAL: There is no tremor. Speech is clear PSYCHIATRIC: Alert and oriented X 3. Affect and behavior are appropriate  Labs reviewed: Recent Labs    07/06/18 0540  07/08/18 0416  07/20/18 0400 07/27/18 0434 08/04/18 0456 08/12/18  NA 142   < > 139   < > 138 138 136 139  K 4.2   < > 4.0   < >  3.9 3.5 3.4* 3.7  CL 108   < > 105   < > 105 104 105  --   CO2 28   < > 26   < > 26 28 25   --   GLUCOSE 118*   < > 105*   < > 99 102* 95  --   BUN 11   < > 12   < > 9 7 6 8   CREATININE 0.86   < > 0.87   < > 0.66 0.67 0.57* 0.5*  CALCIUM 7.8*   < > 7.7*   < > 8.0* 8.1* 8.3*  --   MG 1.9  --  1.8  --   --   --   --   --    < > = values in this interval not displayed.   Recent Labs    07/02/18 0318 07/02/18 0701 07/03/18 0425 07/10/18 0631  AST QUANTITY NOT SUFFICIENT, UNABLE TO PERFORM TEST 55* 32 27  ALT QUANTITY NOT SUFFICIENT, UNABLE TO PERFORM TEST 16 11 10   ALKPHOS 102  --  114 68  BILITOT QUANTITY NOT SUFFICIENT, UNABLE TO PERFORM TEST 0.7 0.9 0.6  PROT 5.6*  --  5.9* 6.2*  ALBUMIN 1.9*  --  2.0* 1.7*   Recent Labs    07/10/18 0631  07/14/18 0832  07/24/18 0317 07/27/18 0434 07/31/18 0416 08/04/18 0456 08/12/18  WBC 11.8*   < > 7.6   < > 8.0 9.7  --  6.7 7.1  NEUTROABS 9.3*  --  5.8  --   --   --   --   --  5  HGB 7.2*   < > 6.3*   < > 7.6* 7.5* 7.5* 7.6* 7.9*  HCT 22.8*   < > 20.5*   < > 25.4* 25.6* 24.7* 25.1* 24*  MCV 89.8   < > 89.9   <  > 88.8 87.7  --  85.4  --   PLT 453*   < > 413*   < > 457* 434*  --  345 389   < > = values in this interval not displayed.   Lab Results  Component Value Date   TSH 2.882 05/01/2012   Lab Results  Component Value Date   HGBA1C 5.1 07/10/2017   Lab Results  Component Value Date   CHOL 127 03/26/2013   HDL 41 03/26/2013   LDLCALC 76 03/26/2013   TRIG 51 03/26/2013   CHOLHDL 3.1 03/26/2013    Assessment/Plan  1. Abscess in epidural space of L2-L5 lumbar spine - followed up with Infectious Disease today and was started on Keflex while awaiting wound closure at mid lower back, has wound vac, and will follow-up in 6 weeks - saccharomyces boulardii (FLORASTOR) 250 MG capsule; Take 1 capsule (250 mg total) by mouth 2 (two) times daily.  Dispense: 30 capsule; Refill: 0  2. Essential hypertension - well-controlled - amLODipine (NORVASC) 10 MG tablet; Take 1 tablet (10 mg total) by mouth daily.  Dispense: 30 tablet; Refill: 0  3. Acute blood loss anemia - ferrous gluconate (FERGON) 324 MG tablet; Take 1 tablet (324 mg total) by mouth 3 (three) times daily with meals.  Dispense: 90 tablet; Refill: 0 Lab Results  Component Value Date   HGB 9.2 (L) 08/25/2018    4. Urinary retention - referred to urology - tamsulosin (FLOMAX) 0.4 MG CAPS capsule; Take 1 capsule (0.4 mg total) by mouth daily.  Dispense: 30 capsule; Refill: 0  5. Chronic  pain syndrome - decreased dosage of Norco 7.5-325 mg Q 4 hours PRN to Q 8 hours PRN - HYDROcodone-acetaminophen (NORCO) 7.5-325 MG tablet; Take 1 tablet by mouth every 8 (eight) hours as needed for moderate pain.  Dispense: 21 tablet; Refill: 0 - methocarbamol (ROBAXIN) 500 MG tablet; Take 1 tablet (500 mg total) by mouth every 6 (six) hours as needed for muscle spasms.  Dispense: 6 tablet; Refill: 0 - morphine (MS CONTIN) 15 MG 12 hr tablet; Take 1 tablet (15 mg total) by mouth every 12 (twelve) hours.  Dispense: 14 tablet; Refill: 0  6. Morbid (severe)  obesity due to excess calories (HCC) - counseled regarding appropriate food choices   7. Depression, recurrent (HCC) - DULoxetine (CYMBALTA) 60 MG capsule; Take 1 capsule (60 mg total) by mouth daily.  Dispense: 30 capsule; Refill: 0  8. Gastroesophageal reflux disease, esophagitis presence not specified - pantoprazole (PROTONIX) 40 MG tablet; Take 1 tablet (40 mg total) by mouth 2 (two) times daily before a meal.  Dispense: 60 tablet; Refill: 0  9. Hypokalemia - potassium chloride SA (K-DUR,KLOR-CON) 20 MEQ tablet; Take 1 tablet (20 mEq total) by mouth 2 (two) times daily.  Dispense: 30 tablet; Refill: 0 Lab Results  Component Value Date   K 3.7 08/12/2018      I have filled out patient's discharge paperwork and written prescriptions.  Patient will receive home health PT, OT, and Nursing.  DME provided: Michiel Sites lift, hospital bed, wound vac  Total discharge time: Greater than 30 minutes Greater than 50% was spent in counseling and coordination of care.  Discharge time involved coordination of the discharge process with social worker, nursing staff and therapy department. Medical justification for home health services/DME verified.   Kenard Gower, NP [facility] [phone] (Monday-Friday 8:00 a.m. - 5:00 p.m.) [phone] (after hours)

## 2018-08-25 NOTE — Assessment & Plan Note (Signed)
He is aware and is working on improving his diet.

## 2018-08-25 NOTE — Assessment & Plan Note (Signed)
Will start him on po keflex while we await his VAC, wound closure.  Will check his labs today.  He appears to be doing well, home from SNF this week.  Will see him back in 6 weeks.

## 2018-08-25 NOTE — Assessment & Plan Note (Signed)
Resolved

## 2018-08-25 NOTE — Progress Notes (Signed)
   Subjective:    Patient ID: [name] [name], male    DOB: 1956/04/20, 62 y.o.   MRN: [id number]  HPI 62 y.o. M with PMHx spinal stenosis with previous lumbar laminectomy (2011, and redo 11-2017) who was admitted 9-14 following a fall while attempting to get out of bed. He had been experiencing several months of worsening right lower back pain with radiation to both legs and a numb feeling. MRI of lumbar and thoracic spine showed 2.9 x 4.3 x 6.1 cm fluid collection with right L4-5 facet effusion. Lumbar abscess cultures and blood cultures MSSA. CRP 30. His course was also noted for L wrist swelling (CT - for infection).  He was treated with Ancef with planned end date on 08-11-18 (42 days).  He was d/c to Rehab on 10-10 then d/c to SNF on 08-07-18. He will be d/c to home on 08-27-18.   He has been feeling well. His PIC is still in his RUE. No problems with this.  Still having pain in his back, intermittent. Still has wound vac, has f/u tomorrow.  Strength- RLE < LLE. Working on getting stronger. Has improved since surgery.   Review of Systems  Constitutional: Negative for appetite change and fever.  Gastrointestinal: Negative for constipation and diarrhea.  Genitourinary: Negative for difficulty urinating.  Musculoskeletal: Positive for back pain.  Neurological: Positive for weakness.  Please see HPI. All other systems reviewed and negative.      Objective:   Physical Exam  Constitutional: He is oriented to person, place, and time. He appears well-developed and well-nourished.  HENT:  Mouth/Throat: No oropharyngeal exudate.  Eyes: Pupils are equal, round, and reactive to light. EOM are normal.  Neck: Normal range of motion. Neck supple.  Cardiovascular: Normal rate, regular rhythm and normal heart sounds.  Pulmonary/Chest: Effort normal and breath sounds normal.  Abdominal: Soft. Bowel sounds are normal. He exhibits no distension.  Musculoskeletal: He exhibits edema.        Arms: Lymphadenopathy:    He has no cervical adenopathy.  Neurological: He is alert and oriented to person, place, and time.       Assessment & Plan:

## 2018-08-26 ENCOUNTER — Other Ambulatory Visit: Payer: Self-pay

## 2018-08-26 ENCOUNTER — Ambulatory Visit (INDEPENDENT_AMBULATORY_CARE_PROVIDER_SITE_OTHER): Payer: No Typology Code available for payment source | Admitting: Specialist

## 2018-08-26 ENCOUNTER — Encounter (INDEPENDENT_AMBULATORY_CARE_PROVIDER_SITE_OTHER): Payer: Self-pay | Admitting: Specialist

## 2018-08-26 ENCOUNTER — Telehealth (INDEPENDENT_AMBULATORY_CARE_PROVIDER_SITE_OTHER): Payer: Self-pay | Admitting: Specialist

## 2018-08-26 VITALS — BP 113/66 | HR 101 | Ht 70.0 in | Wt 310.6 lb

## 2018-08-26 DIAGNOSIS — M4712 Other spondylosis with myelopathy, cervical region: Secondary | ICD-10-CM

## 2018-08-26 DIAGNOSIS — B999 Unspecified infectious disease: Secondary | ICD-10-CM

## 2018-08-26 DIAGNOSIS — M7989 Other specified soft tissue disorders: Secondary | ICD-10-CM

## 2018-08-26 DIAGNOSIS — G8222 Paraplegia, incomplete: Secondary | ICD-10-CM

## 2018-08-26 LAB — CBC
HEMATOCRIT: 28.7 % — AB (ref 38.5–50.0)
HEMOGLOBIN: 9.2 g/dL — AB (ref 13.2–17.1)
MCH: 24.5 pg — ABNORMAL LOW (ref 27.0–33.0)
MCHC: 32.1 g/dL (ref 32.0–36.0)
MCV: 76.3 fL — ABNORMAL LOW (ref 80.0–100.0)
MPV: 9.3 fL (ref 7.5–12.5)
Platelets: 467 10*3/uL — ABNORMAL HIGH (ref 140–400)
RBC: 3.76 10*6/uL — ABNORMAL LOW (ref 4.20–5.80)
RDW: 16 % — AB (ref 11.0–15.0)
WBC: 7.7 10*3/uL (ref 3.8–10.8)

## 2018-08-26 LAB — COMPREHENSIVE METABOLIC PANEL
AG Ratio: 0.6 (calc) — ABNORMAL LOW (ref 1.0–2.5)
ALBUMIN MSPROF: 3.1 g/dL — AB (ref 3.6–5.1)
ALT: 5 U/L — AB (ref 9–46)
AST: 13 U/L (ref 10–35)
Alkaline phosphatase (APISO): 194 U/L — ABNORMAL HIGH (ref 40–115)
BILIRUBIN TOTAL: 0.4 mg/dL (ref 0.2–1.2)
BUN/Creatinine Ratio: 9 (calc) (ref 6–22)
BUN: 6 mg/dL — AB (ref 7–25)
CALCIUM: 9.2 mg/dL (ref 8.6–10.3)
CO2: 30 mmol/L (ref 20–32)
Chloride: 103 mmol/L (ref 98–110)
Creat: 0.68 mg/dL — ABNORMAL LOW (ref 0.70–1.25)
GLUCOSE: 95 mg/dL (ref 65–99)
Globulin: 5.1 g/dL (calc) — ABNORMAL HIGH (ref 1.9–3.7)
Potassium: 3.9 mmol/L (ref 3.5–5.3)
Sodium: 141 mmol/L (ref 135–146)
Total Protein: 8.2 g/dL — ABNORMAL HIGH (ref 6.1–8.1)

## 2018-08-26 LAB — BASIC METABOLIC PANEL
BUN: 8 (ref 4–21)
CREATININE: 0.6 (ref 0.6–1.3)
GLUCOSE: 99
POTASSIUM: 3.5 (ref 3.4–5.3)
Sodium: 144 (ref 137–147)

## 2018-08-26 LAB — C-REACTIVE PROTEIN: CRP: 88.9 mg/L — AB (ref <8.0)

## 2018-08-26 LAB — HEPATIC FUNCTION PANEL
ALT: 5 — AB (ref 10–40)
AST: 11 — AB (ref 14–40)
Alkaline Phosphatase: 160 — AB (ref 25–125)
Bilirubin, Total: 0.3

## 2018-08-26 LAB — CBC AND DIFFERENTIAL
HEMATOCRIT: 25 — AB (ref 41–53)
Hemoglobin: 8.1 — AB (ref 13.5–17.5)
Neutrophils Absolute: 5
Platelets: 375 (ref 150–399)
WBC: 8

## 2018-08-26 LAB — SEDIMENTATION RATE: SED RATE: 108 mm/h — AB (ref 0–20)

## 2018-08-26 MED ORDER — CEPHALEXIN 500 MG PO CAPS: 500.0000 mg | ORAL_CAPSULE | Freq: Two times a day (BID) | ORAL | 0 refills | Status: DC

## 2018-08-26 NOTE — Progress Notes (Signed)
Post-Op Visit Note   Patient: [name] [name]           Date of Birth: 11/05/1955           MRN: [id number] Visit Date: 08/26/2018 PCP: Renford Dills, MD   Assessment & Plan: 8 weeks post lumbar incision and drainage of lumbar incision abscess with VAC. VAC treatment was successful and I recommend normal saline wet to dry dressings till skin is totally healed.  Chief Complaint:  Chief Complaint  Patient presents with  . Lower Back - Pain  Lower limbs with profound weakness, right greater tahn left. He has spastic spasms of the thighs.  Visit Diagnoses:  1. Soft tissue infection of lumbar spine   2. Other spondylosis with myelopathy, cervical region   3. Acute incomplete spastic paraplegia (HCC)     Plan:Referral to Dr. Karene Fry for Eval and treatment of Thoracic and Cervical  Stenosis and myelopathy.  Daily dressing changes with normal saline wet to dry lumbar spine till skin is healed. Return appt in 2weeks.  Follow-Up Instructions: No follow-ups on file.   Orders:  Orders Placed This Encounter  Procedures  . Ambulatory referral to Neurosurgery   No orders of the defined types were placed in this encounter.   Imaging: No results found.  PMFS History: Patient Active Problem List   Diagnosis Date Noted  . Septic arthritis of knee, left (HCC) 06/30/2018    Priority: High    Class: Acute  . Spondylosis, thoracic, with myelopathy 06/28/2018    Priority: High    Class: Acute  . Abscess in epidural space of L2-L5 lumbar spine 06/28/2018    Priority: High    Class: Acute  . Left cervical radiculopathy 06/28/2018    Priority: High    Class: Acute  . Spinal stenosis of lumbar region 11/17/2017    Priority: High    Class: Chronic  . Complete tear of left rotator cuff 07/03/2018    Priority: Medium    Class: Chronic  . At risk for adverse drug event 08/10/2018  . Chronic pain syndrome   . Labile blood pressure   . Urinary retention   . Hypokalemia   .  Hypertension   . Acute blood loss anemia   . Anemia of chronic disease   . Abscess in epidural space of lumbar spine   . Effusion, left knee   . Pain and swelling of wrist, left   . MSSA bacteremia   . Leukocytosis   . Postoperative seroma involving nervous system after nervous system procedure   . Shoulder pain, bilateral   . Thoracic myelopathy 06/28/2018  . Acute urinary retention 06/28/2018  . Status post lumbar laminectomy 11/17/2017  . Lumbar stenosis with neurogenic claudication 08/18/2017  . Forestier's disease of thoracolumbar region 08/18/2017  . Degenerative disc disease, lumbar 08/18/2017  . Lumbar radiculopathy 09/30/2016  . Morbid (severe) obesity due to excess calories (HCC) 09/30/2016  . Biliary calculus with acute cholecystitis 03/21/2016  . Acute pyelonephritis 12/28/2013  . Ureteral stone with hydronephrosis 12/28/2013  . Acute kidney failure (HCC) 12/27/2013  . Severe sepsis with acute organ dysfunction (HCC) 12/27/2013  . Hypotension 12/27/2013  . Lactic acidosis 12/27/2013  . Sepsis secondary to UTI (HCC) 12/27/2013  . EMPYEMA CHEST 08/30/2008  . PRURIGO 08/30/2008  . OBSTRUCTIVE SLEEP APNEA 08/30/2008   Past Medical History:  Diagnosis Date  . Anemia   . Arthritis   . Chronic kidney disease    HX acute kidney failure /  acute pyelonephritis / hydronephrosis / severe sepsis per discharge summary 12/27/13  . History of kidney stones   . Hypertension   . Morbid obesity (HCC)   . Obstructive sleep apnea    does not need c pap since 110 lb wt loss  . Osteoporosis   . Prurigo 2002  . Scars    ON ARMS FROM CHEMICAL EXPLOSION 1999  . Spinal stenosis     History reviewed. No pertinent family history.  Past Surgical History:  Procedure Laterality Date  . BACK SURGERY  01/23/2010   lumbar  . CHOLECYSTECTOMY N/A 03/24/2016   Procedure: LAPAROSCOPIC CHOLECYSTECTOMY WITH INTRAOPERATIVE CHOLANGIOGRAM;  Surgeon: Chevis PrettyPaul Toth III, MD;  Location: [facility];  Service:  General;  Laterality: N/A;  . CIRCUMCISION    . CYSTOSCOPY WITH RETROGRADE PYELOGRAM, URETEROSCOPY AND STENT PLACEMENT Left 01/20/2014   Procedure: CYSTOSCOPY WITH RETROGRADE PYELOGRAM, URETEROSCOPY AND STENT EXCHANGE;  Surgeon: Milford Cageaniel Young Woodruff, MD;  Location: [facility];  Service: Urology;  Laterality: Left;  bugbee bladder fulguration  . CYSTOSCOPY WITH STENT PLACEMENT Left 12/28/2013   Procedure: CYSTOSCOPY WITH STENT PLACEMENT left retrograde pyleogram;  Surgeon: Milford Cageaniel Young Woodruff, MD;  Location: [facility];  Service: Urology;  Laterality: Left;  . ESOPHAGOGASTRODUODENOSCOPY N/A 12/07/2012   Procedure: ESOPHAGOGASTRODUODENOSCOPY (EGD);  Surgeon: Lodema PilotBrian Layton, DO;  Location: [facility];  Service: General;  Laterality: N/A;  . HOLMIUM LASER APPLICATION Left 01/20/2014   Procedure: HOLMIUM LASER APPLICATION;  Surgeon: Milford Cageaniel Young Woodruff, MD;  Location: [facility];  Service: Urology;  Laterality: Left;  . KNEE ARTHROSCOPY Left 06/29/2018   Procedure: ARTHROSCOPY KNEE;  Surgeon: Kerrin ChampagneNitka,  E, MD;  [facility];  Service: Orthopedics;  Laterality: Left;  . LAPAROSCOPIC GASTRIC SLEEVE RESECTION N/A 12/07/2012   Procedure: LAPAROSCOPIC GASTRIC SLEEVE RESECTION;  Surgeon: Lodema PilotBrian Layton, DO;  Location: [facility];  Service: General;  Laterality: N/A;  laparoscopic sleeve gastrectomy with EGD  . LUMBAR LAMINECTOMY N/A 11/17/2017   Procedure: L2-3 LAMINECTOMY AND REDO LAMINECTOMIES  L3-4, L4-5 AND L5-S1;  Surgeon: Kerrin ChampagneNitka,  E, MD;  Location: [facility] OR;  Service: Orthopedics;  Laterality: N/A;  . LUMBAR WOUND DEBRIDEMENT N/A 06/29/2018   Procedure: LUMBAR WOUND DEBRIDEMENT DRAINAGE AND IRRIGATION; AND ASPIRATION OF LEFT KNEE;  Surgeon: Kerrin ChampagneNitka,  E, MD;  Location: [facility] OR;  Service: Orthopedics;  Laterality: N/A;  . TEE WITHOUT CARDIOVERSION N/A 07/01/2018   Procedure: TRANSESOPHAGEAL ECHOCARDIOGRAM (TEE);  Surgeon: Laurey MoraleMcLean, Dalton S, MD;  [name];  Service: Cardiovascular;  Laterality: N/A;   Social  History   Occupational History  . Not on file  Tobacco Use  . Smoking status: Former Smoker    Types: Cigarettes    Last attempt to quit: 04/09/1974    Years since quitting: 44.4  . Smokeless tobacco: Never Used  Substance and Sexual Activity  . Alcohol use: No  . Drug use: No  . Sexual activity: Not on file

## 2018-08-26 NOTE — Patient Instructions (Signed)
Plan:Referral to Dr. Karene FryAndy [name] for Eval and treatment of Thoracic and Cervical  Stenosis and myelopathy.  Daily dressing changes with normal saline wet to dry lumbar spine till skin is healed. Return appt in 2weeks.

## 2018-08-26 NOTE — Telephone Encounter (Signed)
Resident of [facility], discharging home on 08/27/18.  Had a wound vac with fever. Dr. Ninetta LightsHatcher saw 08/25/18 and stated in his note t to start Keflex, but no script was written.

## 2018-08-26 NOTE — Telephone Encounter (Signed)
Patient is needing to see Dr. Otelia SergeantNitka around 09/09/18, but there was no availability.  Patient is scheduled for December 26th.  Please place patient on the cancellation list for an appointment around Thanksgiving.  CB#[phone].  Thank you.

## 2018-08-27 ENCOUNTER — Encounter: Payer: Self-pay | Admitting: Internal Medicine

## 2018-08-27 ENCOUNTER — Non-Acute Institutional Stay (SKILLED_NURSING_FACILITY): Payer: No Typology Code available for payment source | Admitting: Internal Medicine

## 2018-08-27 DIAGNOSIS — E8809 Other disorders of plasma-protein metabolism, not elsewhere classified: Secondary | ICD-10-CM | POA: Diagnosis not present

## 2018-08-27 DIAGNOSIS — Z978 Presence of other specified devices: Secondary | ICD-10-CM

## 2018-08-27 DIAGNOSIS — Z960 Presence of urogenital implants: Secondary | ICD-10-CM | POA: Diagnosis not present

## 2018-08-27 DIAGNOSIS — R509 Fever, unspecified: Secondary | ICD-10-CM | POA: Diagnosis not present

## 2018-08-27 DIAGNOSIS — D509 Iron deficiency anemia, unspecified: Secondary | ICD-10-CM

## 2018-08-27 NOTE — Assessment & Plan Note (Signed)
08/27/2018 anemia is progressive without bleeding dyscrasias other than easy bruising iron administration tid pc will be verified Recheck CBC in a.m. Hematology evaluation if progressive

## 2018-08-27 NOTE — Telephone Encounter (Signed)
I put him on the cancellation list 

## 2018-08-27 NOTE — Patient Instructions (Signed)
See assessment and plan under each diagnosis in the problem list and acutely for this visit 

## 2018-08-27 NOTE — Progress Notes (Signed)
NURSING HOME LOCATION:  [facility] ROOM NUMBER:  [id number]  CODE STATUS:  Full Code  PCP:  Renford Dills, MD  [address] [place] [address]  This is a nursing facility follow up for specific acute issue of progressive anemia and fever of 100.6.  Interim medical record and care since last [facility] visit was updated with review of diagnostic studies and change in clinical status since last visit were documented.  HPI: Patient was to have been discharged today but labs drawn yesterday through [facility] reveal hemoglobin 8.1 and hematocrit 24., down from respective values of 9.2 and 28.7 @ [facility] on 11/12 and 11.8 and 35 on 08/06/2018.  Indices are hypochromic, microcytic.  Ferrous gluconate 324 mg is ordered 3 times a day with meals.  Chemistries reveal a markedly decreased albumin with a value of 2.7, normal 3.5-5.2.  Total protein was normal, globulin was minimally elevated at 4.1.  Urinalysis revealed 3+ leukocyte esterase with positive nitrites and too numerous to count white cells. 4+ plus bacteria was noted.  Culture is pending at this time.  As noted he he does have fever; white count was 8000 with essentially normal differential.  Monocyte count was insignificantly elevated. Dr. Ninetta Lights, ID saw the patient 11/12; blood cultures were repeated because of a history of MSS a bacteremia and history of abscess in the epidural space of L2-L5.  Pending wound closure Keflex was ordered.  He does have a history of urticaria with Unasyn IV in 2015.  Follow-up with Dr. Ninetta Lights is scheduled 09/30/2018.   Orthopedic follow-up with Dr. Otelia Sergeant was yesterday.  VAC treatment was D/Ced; normal saline wet-to-dry dressings to the skin until total healing was recommended.  Follow-up was to be in 2 weeks.Dr Otelia Sergeant planned referral to Dr. Leta Speller, for evaluation treatment of thoracic and cervical stenosis with associated myelopathy.  Review of systems: He was anxious  to go home today but comprehends the clinical concerns of progressive anemia and fever without definite diagnosed etiology. He denies any bleeding dyscrasias other than being "bruise prone".  He had a colonoscopy in 2015 that was negative.  He denies a history of sickle cell disease or trait.  He is unsure whether he is getting the iron after each meal. He has no localizing signs or symptoms related to the fever but does have the Foley catheter.  He states the wound is healing well. On Keflex he denies any pruritus or urticaria.  Constitutional: No  significant weight change, fatigue  Eyes: No redness, discharge, pain, vision change ENT/mouth: No nasal congestion,  purulent discharge, earache, change in hearing, sore throat  Cardiovascular: No chest pain, palpitations, paroxysmal nocturnal dyspnea  Respiratory: No cough, sputum production, hemoptysis, DOE, significant snoring, apnea   Gastrointestinal: No heartburn, dysphagia, abdominal pain, nausea /vomiting, rectal bleeding, melena, change in bowels Genitourinary: No dysuria, hematuria, pyuria Dermatologic: No rash, pruritus, change in appearance of skin Neurologic: No dizziness, headache, syncope, seizures Psychiatric: No significant anxiety, depression, insomnia, anorexia Endocrine: No change in hair/skin/nails, excessive thirst, excessive hunger, excessive urination  Hematologic/lymphatic: No significant  lymphadenopathy, abnormal bleeding Allergy/immunology: No itchy/watery eyes, significant sneezing, urticaria, angioedema  Physical exam:  Pertinent or positive findings: He is morbidly obese.  He is supine and tends to exhibit minimal spontaneous muscular  activity.  Pattern alopecia is present.  He is Transport planner.  Breath sounds are decreased.  Gynecomastia is present.  Abdomen is massive.  Posterior tibial pulses are decreased but  the dorsalis pedis pulses are strong.  He has 1/2+ edema at the ankle/sock line.  Valgus deformity of the lower  extremities is present.  He is weak to opposition in all extremities but greatest in the right lower extremity.  He has diffuse bland hyperpigmented lesions related to repeated scratching, most notably of the torso.He has 2 amateur tattoos of the dorsum of the hands.  General appearance: Adequately nourished; no acute distress, increased work of breathing is present.   Lymphatic: No lymphadenopathy about the head, neck, axilla. Eyes: No conjunctival inflammation or lid edema is present. There is no scleral icterus. Ears:  External ear exam shows no significant lesions or deformities.   Nose:  External nasal examination shows no deformity or inflammation. Nasal mucosa are pink and moist without lesions, exudates Oral exam:  Lips and gums are healthy appearing. There is no oropharyngeal erythema or exudate. Neck:  No thyromegaly, masses, tenderness noted.    Heart:  Normal rate and regular rhythm. S1 and S2 normal without gallop, murmur, click, rub .  Lungs: Chest clear to auscultation without wheezes, rhonchi, rales, rubs. Abdomen: Bowel sounds are normal. Abdomen is soft and nontender with no organomegaly, hernias, masses. GU: Deferred  Extremities:  No cyanosis, clubbing  Neurologic exam : Balance, Rhomberg, finger to nose testing could not be completed due to clinical state Skin: Warm & dry w/o tenting. No significant rash.  See summary under each active problem in the Problem List with associated updated therapeutic plan

## 2018-08-28 ENCOUNTER — Ambulatory Visit (INDEPENDENT_AMBULATORY_CARE_PROVIDER_SITE_OTHER): Payer: No Typology Code available for payment source | Admitting: Specialist

## 2018-09-01 LAB — CULTURE, BLOOD (SINGLE)
MICRO NUMBER: 91361121
MICRO NUMBER:: 91361122
RESULT: NO GROWTH
Result:: NO GROWTH
SPECIMEN QUALITY: ADEQUATE
SPECIMEN QUALITY:: ADEQUATE

## 2018-09-21 ENCOUNTER — Encounter: Payer: No Typology Code available for payment source | Admitting: Physical Medicine & Rehabilitation

## 2018-09-21 ENCOUNTER — Encounter (INDEPENDENT_AMBULATORY_CARE_PROVIDER_SITE_OTHER): Payer: Self-pay | Admitting: Specialist

## 2018-09-21 ENCOUNTER — Ambulatory Visit (INDEPENDENT_AMBULATORY_CARE_PROVIDER_SITE_OTHER): Payer: No Typology Code available for payment source | Admitting: Specialist

## 2018-09-21 ENCOUNTER — Telehealth (INDEPENDENT_AMBULATORY_CARE_PROVIDER_SITE_OTHER): Payer: Self-pay | Admitting: Specialist

## 2018-09-21 VITALS — BP 114/80 | HR 101 | Ht 70.0 in | Wt 310.0 lb

## 2018-09-21 DIAGNOSIS — G894 Chronic pain syndrome: Secondary | ICD-10-CM

## 2018-09-21 DIAGNOSIS — M00862 Arthritis due to other bacteria, left knee: Secondary | ICD-10-CM

## 2018-09-21 DIAGNOSIS — G061 Intraspinal abscess and granuloma: Secondary | ICD-10-CM

## 2018-09-21 MED ORDER — HYDROCODONE-ACETAMINOPHEN 7.5-325 MG PO TABS: 1.0000 | ORAL_TABLET | Freq: Three times a day (TID) | ORAL | 0 refills | Status: DC | PRN

## 2018-09-21 MED ORDER — CELECOXIB 200 MG PO CAPS: 200.0000 mg | ORAL_CAPSULE | Freq: Every day | ORAL | 3 refills | Status: DC

## 2018-09-21 MED ORDER — DICLOFENAC SODIUM 1 % TD GEL: 4.0000 g | Freq: Four times a day (QID) | TRANSDERMAL | 3 refills | Status: DC

## 2018-09-21 NOTE — Telephone Encounter (Signed)
Patient is needing to see Dr. Otelia SergeantNitka around October 19, 2018.  His appointment is currently on January 22nd @ 9:45.  Please place the patient on your cancellation list.  Thank you.

## 2018-09-21 NOTE — Progress Notes (Signed)
Post-Op Visit Note   Patient: [name] [name]           Date of Birth: Jan 01, 1956           MRN: [id number] Visit Date: 09/21/2018 PCP: Renford DillsPolite, Ronald, MD   Assessment & Plan:Lumbar infection s/p I&D 07/11/2018, Myelopathy due to cervical and thoracic spinal stenosis. Bowel and bladder incontinence.   Chief Complaint:  Chief Complaint  Patient presents with  . Lower Back - Follow-up   Visit Diagnoses:  1. Abscess in epidural space of L2-L5 lumbar spine   2. Arthritis of left knee due to other bacteria [facility](HCC)     Plan: Avoid bending, stooping and avoid lifting weights greater than 10 lbs. Avoid prolong standing and walking. Avoid frequent bending and stooping  No lifting greater than 10 lbs. May use ice or moist heat for pain. Weight loss is of benefit. Handicap license is approved.  Knee is suffering from osteoarthritis, only real proven treatments are Weight loss, NSIADs like ibuprofen and exercise. Well padded shoes help. Ice the knee 2-3 times a day 15-20 mins at a time.   Follow-Up Instructions: No follow-ups on file.   Orders:  No orders of the defined types were placed in this encounter.  No orders of the defined types were placed in this encounter.   Imaging: No results found.  PMFS History: Patient Active Problem List   Diagnosis Date Noted  . Septic arthritis of knee, left (HCC) 06/30/2018    Priority: High    Class: Acute  . Spondylosis, thoracic, with myelopathy 06/28/2018    Priority: High    Class: Acute  . Abscess in epidural space of L2-L5 lumbar spine 06/28/2018    Priority: High    Class: Acute  . Left cervical radiculopathy 06/28/2018    Priority: High    Class: Acute  . Spinal stenosis of lumbar region 11/17/2017    Priority: High    Class: Chronic  . Complete tear of left rotator cuff 07/03/2018    Priority: Medium    Class: Chronic  . Microcytic hypochromic anemia 08/27/2018  . At risk for adverse drug event 08/10/2018  .  Chronic pain syndrome   . Labile blood pressure   . Urinary retention   . Hypokalemia   . Hypertension   . Acute blood loss anemia   . Anemia of chronic disease   . Abscess in epidural space of lumbar spine   . Effusion, left knee   . Pain and swelling of wrist, left   . MSSA bacteremia   . Leukocytosis   . Postoperative seroma involving nervous system after nervous system procedure   . Shoulder pain, bilateral   . Thoracic myelopathy 06/28/2018  . Acute urinary retention 06/28/2018  . Status post lumbar laminectomy 11/17/2017  . Lumbar stenosis with neurogenic claudication 08/18/2017  . Forestier's disease of thoracolumbar region 08/18/2017  . Degenerative disc disease, lumbar 08/18/2017  . Lumbar radiculopathy 09/30/2016  . Morbid (severe) obesity due to excess calories (HCC) 09/30/2016  . Biliary calculus with acute cholecystitis 03/21/2016  . Acute pyelonephritis 12/28/2013  . Ureteral stone with hydronephrosis 12/28/2013  . Acute kidney failure (HCC) 12/27/2013  . Severe sepsis with acute organ dysfunction (HCC) 12/27/2013  . Hypotension 12/27/2013  . Lactic acidosis 12/27/2013  . Sepsis secondary to UTI (HCC) 12/27/2013  . EMPYEMA CHEST 08/30/2008  . PRURIGO 08/30/2008  . OBSTRUCTIVE SLEEP APNEA 08/30/2008   Past Medical History:  Diagnosis Date  . Anemia   .  Arthritis   . Chronic kidney disease    HX acute kidney failure / acute pyelonephritis / hydronephrosis / severe sepsis per discharge summary 12/27/13  . History of kidney stones   . Hypertension   . Morbid obesity (HCC)   . Obstructive sleep apnea    does not need c pap since 110 lb wt loss  . Osteoporosis   . Prurigo 2002  . Scars    ON ARMS FROM CHEMICAL EXPLOSION 1999  . Spinal stenosis     No family history on file.  Past Surgical History:  Procedure Laterality Date  . BACK SURGERY  01/23/2010   lumbar  . CHOLECYSTECTOMY N/A 03/24/2016   Procedure: LAPAROSCOPIC CHOLECYSTECTOMY WITH INTRAOPERATIVE  CHOLANGIOGRAM;  Surgeon: Chevis Pretty III, MD;  Location: [facility];  Service: General;  Laterality: N/A;  . CIRCUMCISION    . CYSTOSCOPY WITH RETROGRADE PYELOGRAM, URETEROSCOPY AND STENT PLACEMENT Left 01/20/2014   Procedure: CYSTOSCOPY WITH RETROGRADE PYELOGRAM, URETEROSCOPY AND STENT EXCHANGE;  Surgeon: Milford Cage, MD;  Location: [facility];  Service: Urology;  Laterality: Left;  bugbee bladder fulguration  . CYSTOSCOPY WITH STENT PLACEMENT Left 12/28/2013   Procedure: CYSTOSCOPY WITH STENT PLACEMENT left retrograde pyleogram;  Surgeon: Milford Cage, MD;  Location: [facility];  Service: Urology;  Laterality: Left;  . ESOPHAGOGASTRODUODENOSCOPY N/A 12/07/2012   Procedure: ESOPHAGOGASTRODUODENOSCOPY (EGD);  Surgeon: Lodema Pilot, DO;  Location: [facility];  Service: General;  Laterality: N/A;  . HOLMIUM LASER APPLICATION Left 01/20/2014   Procedure: HOLMIUM LASER APPLICATION;  Surgeon: Milford Cage, MD;  Location: [facility];  Service: Urology;  Laterality: Left;  . KNEE ARTHROSCOPY Left 06/29/2018   Procedure: ARTHROSCOPY KNEE;  Surgeon: Kerrin Champagne, MD;  Location: [facility] OR;  Service: Orthopedics;  Laterality: Left;  . LAPAROSCOPIC GASTRIC SLEEVE RESECTION N/A 12/07/2012   Procedure: LAPAROSCOPIC GASTRIC SLEEVE RESECTION;  Surgeon: Lodema Pilot, DO;  Location: [facility];  Service: General;  Laterality: N/A;  laparoscopic sleeve gastrectomy with EGD  . LUMBAR LAMINECTOMY N/A 11/17/2017   Procedure: L2-3 LAMINECTOMY AND REDO LAMINECTOMIES  L3-4, L4-5 AND L5-S1;  Surgeon: Kerrin Champagne, MD;  Location: [facility] OR;  Service: Orthopedics;  Laterality: N/A;  . LUMBAR WOUND DEBRIDEMENT N/A 06/29/2018   Procedure: LUMBAR WOUND DEBRIDEMENT DRAINAGE AND IRRIGATION; AND ASPIRATION OF LEFT KNEE;  Surgeon: Kerrin Champagne, MD;  Location: [facility] OR;  Service: Orthopedics;  Laterality: N/A;  . TEE WITHOUT CARDIOVERSION N/A 07/01/2018   Procedure: TRANSESOPHAGEAL ECHOCARDIOGRAM (TEE);  Surgeon: Laurey Morale, MD;  Location:  [facility]COPY;  Service: Cardiovascular;  Laterality: N/A;   Social History   Occupational History  . Not on file  Tobacco Use  . Smoking status: Former Smoker    Types: Cigarettes    Last attempt to quit: 04/09/1974    Years since quitting: 44.4  . Smokeless tobacco: Never Used  Substance and Sexual Activity  . Alcohol use: No  . Drug use: No  . Sexual activity: Not on file

## 2018-09-21 NOTE — Telephone Encounter (Signed)
Put him on the cancellation list 

## 2018-09-21 NOTE — Addendum Note (Signed)
Addended by: Vira BrownsNITKA, Kimya Mccahill on: 09/21/2018 11:52 AM   Modules accepted: Orders

## 2018-09-22 ENCOUNTER — Telehealth (INDEPENDENT_AMBULATORY_CARE_PROVIDER_SITE_OTHER): Payer: Self-pay | Admitting: Specialist

## 2018-09-22 NOTE — Telephone Encounter (Signed)
Patients [name] [name] was sent to pharmacy electronically but they did not rec'e it.  Can you have [name] [name] to re-write it and call them and they will need to pick it up.  I did advise the patient that the Volteran gel was approved and that they are able to pick it up at the pharm now.

## 2018-09-22 NOTE — Telephone Encounter (Signed)
Medication Missing Hydrocodone   Patient wife called wanted to inform Dr.Nitka his prescription was never sent to the pharmacy. They were unable to pick up cream for his knee due to insurance not willing to pay.

## 2018-09-22 NOTE — Telephone Encounter (Signed)
Forwarding to Murphy OilJames Owens,PA-C

## 2018-09-25 ENCOUNTER — Other Ambulatory Visit (INDEPENDENT_AMBULATORY_CARE_PROVIDER_SITE_OTHER): Payer: Self-pay

## 2018-09-25 ENCOUNTER — Other Ambulatory Visit (INDEPENDENT_AMBULATORY_CARE_PROVIDER_SITE_OTHER): Payer: Self-pay | Admitting: Surgery

## 2018-09-25 DIAGNOSIS — G894 Chronic pain syndrome: Secondary | ICD-10-CM

## 2018-09-25 MED ORDER — HYDROCODONE-ACETAMINOPHEN 7.5-325 MG PO TABS: 1.0000 | ORAL_TABLET | Freq: Three times a day (TID) | ORAL | 0 refills | Status: DC | PRN

## 2018-09-25 NOTE — Telephone Encounter (Signed)
Reprint this so you can sigh this rx

## 2018-09-25 NOTE — Telephone Encounter (Signed)
Please reprint script for me to sign later.  Thanks.

## 2018-09-28 ENCOUNTER — Other Ambulatory Visit: Payer: Self-pay | Admitting: Adult Health

## 2018-09-28 DIAGNOSIS — K219 Gastro-esophageal reflux disease without esophagitis: Secondary | ICD-10-CM

## 2018-09-28 DIAGNOSIS — R339 Retention of urine, unspecified: Secondary | ICD-10-CM

## 2018-09-30 ENCOUNTER — Ambulatory Visit: Payer: No Typology Code available for payment source | Admitting: Infectious Diseases

## 2018-10-01 ENCOUNTER — Other Ambulatory Visit (HOSPITAL_COMMUNITY): Payer: Self-pay | Admitting: Neurosurgery

## 2018-10-01 DIAGNOSIS — M5416 Radiculopathy, lumbar region: Secondary | ICD-10-CM

## 2018-10-01 DIAGNOSIS — M4802 Spinal stenosis, cervical region: Secondary | ICD-10-CM

## 2018-10-08 ENCOUNTER — Ambulatory Visit (INDEPENDENT_AMBULATORY_CARE_PROVIDER_SITE_OTHER): Payer: No Typology Code available for payment source | Admitting: Specialist

## 2018-11-03 ENCOUNTER — Ambulatory Visit (HOSPITAL_COMMUNITY)
Admission: RE | Admit: 2018-11-03 | Discharge: 2018-11-03 | Disposition: A | Discharge status: Home / Self Care | Payer: No Typology Code available for payment source | Source: Ambulatory Visit | Attending: Neurosurgery | Admitting: Neurosurgery

## 2018-11-03 DIAGNOSIS — M5416 Radiculopathy, lumbar region: Secondary | ICD-10-CM | POA: Diagnosis not present

## 2018-11-03 DIAGNOSIS — M4802 Spinal stenosis, cervical region: Secondary | ICD-10-CM | POA: Insufficient documentation

## 2018-11-03 MED ORDER — GADOBUTROL 1 MMOL/ML IV SOLN
10.0000 mL | Freq: Once | INTRAVENOUS | Status: AC | PRN
  Administered 2018-11-03: 10 mL via INTRAVENOUS

## 2018-11-04 ENCOUNTER — Ambulatory Visit (INDEPENDENT_AMBULATORY_CARE_PROVIDER_SITE_OTHER): Payer: No Typology Code available for payment source | Admitting: Specialist

## 2018-11-26 ENCOUNTER — Other Ambulatory Visit: Payer: Self-pay | Admitting: Neurosurgery

## 2018-12-02 NOTE — Pre-Procedure Instructions (Signed)
Elazar Bethke  12/02/2018      [id number] [place], [place] - [address] AT [facility] OF [address] [place] [address] Phone: [phone] Fax: [phone]    Your procedure is scheduled on Monday, February 24th.  Report to [facility] Admitting Entrance "A" at 9:25 A.M.  Call this number if you have problems the morning of surgery:  [phone]   Remember:  Do not eat or drink after midnight.    Take these medicines the morning of surgery with A SIP OF WATER  DULoxetine (CYMBALTA) pantoprazole (PROTONIX) tamsulosin (FLOMAX) HYDROcodone-acetaminophen (NORCO)-as needed  As of today, STOP taking any Aspirin (unless otherwise instructed by your surgeon), Aleve, Naproxen, Ibuprofen, Motrin, Advil, Goody's, BC's, all herbal medications, fish oil, and all vitamins. This includes your celecoxib (CELEBREX) and diclofenac sodium (VOLTAREN) GEL.    Do not wear jewelry.  Do not wear lotions, powders, or colognes, or deodorant.  Do not shave 48 hours prior to surgery.  Men may shave face and neck.  Do not bring valuables to the hospital.  [facility] is not responsible for any belongings or valuables.  Contacts, dentures or bridgework may not be worn into surgery.  Leave your suitcase in the car.  After surgery it may be brought to your room.  For patients admitted to the hospital, discharge time will be determined by your treatment team.  Patients discharged the day of surgery will not be allowed to drive home.   Special instructions:   [facility]- Preparing For Surgery  Before surgery, you can play an important role. Because skin is not sterile, your skin needs to be as free of germs as possible. You can reduce the number of germs on your skin by washing with CHG (chlorahexidine gluconate) Soap before surgery.  CHG is an antiseptic cleaner which kills germs and bonds with the skin to continue killing germs  even after washing.    Oral Hygiene is also important to reduce your risk of infection.  Remember - BRUSH YOUR TEETH THE MORNING OF SURGERY WITH YOUR REGULAR TOOTHPASTE  Please do not use if you have an allergy to CHG or antibacterial soaps. If your skin becomes reddened/irritated stop using the CHG.  Do not shave (including legs and underarms) for at least 48 hours prior to first CHG shower. It is OK to shave your face.  Please follow these instructions carefully.   1. Shower the NIGHT BEFORE SURGERY and the MORNING OF SURGERY with CHG.   2. If you chose to wash your hair, wash your hair first as usual with your normal shampoo.  3. After you shampoo, rinse your hair and body thoroughly to remove the shampoo.  4. Use CHG as you would any other liquid soap. You can apply CHG directly to the skin and wash gently with a scrungie or a clean washcloth.   5. Apply the CHG Soap to your body ONLY FROM THE NECK DOWN.  Do not use on open wounds or open sores. Avoid contact with your eyes, ears, mouth and genitals (private parts). Wash Face and genitals (private parts)  with your normal soap.  6. Wash thoroughly, paying special attention to the area where your surgery will be performed.  7. Thoroughly rinse your body with warm water from the neck down.  8. DO NOT shower/wash with your normal soap after using and rinsing off the CHG Soap.  9. Dennie Bible  yourself dry with a CLEAN TOWEL.  10. Wear CLEAN PAJAMAS to bed the night before surgery, wear comfortable clothes the morning of surgery  11. Place CLEAN SHEETS on your bed the night of your first shower and DO NOT SLEEP WITH PETS.    Day of Surgery:  Do not apply any deodorants/lotions.  Please wear clean clothes to the hospital/surgery center.   Remember to brush your teeth WITH YOUR REGULAR TOOTHPASTE.   Please read over the following fact sheets that you were given.

## 2018-12-03 ENCOUNTER — Encounter (HOSPITAL_COMMUNITY): Payer: Self-pay

## 2018-12-03 ENCOUNTER — Other Ambulatory Visit: Payer: Self-pay

## 2018-12-03 ENCOUNTER — Encounter (HOSPITAL_COMMUNITY)
Admission: RE | Admit: 2018-12-03 | Discharge: 2018-12-03 | Disposition: A | Discharge status: Home / Self Care | Payer: No Typology Code available for payment source | Source: Ambulatory Visit | Attending: Neurosurgery | Admitting: Neurosurgery

## 2018-12-03 DIAGNOSIS — Z01818 Encounter for other preprocedural examination: Secondary | ICD-10-CM | POA: Insufficient documentation

## 2018-12-03 LAB — CBC WITH DIFFERENTIAL/PLATELET
Abs Immature Granulocytes: 0 10*3/uL (ref 0.00–0.07)
BASOS ABS: 0 10*3/uL (ref 0.0–0.1)
Basophils Relative: 1 %
EOS ABS: 0.3 10*3/uL (ref 0.0–0.5)
Eosinophils Relative: 8 %
HCT: 38.6 % — ABNORMAL LOW (ref 39.0–52.0)
Hemoglobin: 11.5 g/dL — ABNORMAL LOW (ref 13.0–17.0)
Immature Granulocytes: 0 %
LYMPHS PCT: 32 %
Lymphs Abs: 1.1 10*3/uL (ref 0.7–4.0)
MCH: 24.3 pg — ABNORMAL LOW (ref 26.0–34.0)
MCHC: 29.8 g/dL — ABNORMAL LOW (ref 30.0–36.0)
MCV: 81.6 fL (ref 80.0–100.0)
Monocytes Absolute: 0.4 10*3/uL (ref 0.1–1.0)
Monocytes Relative: 10 %
Neutro Abs: 1.7 10*3/uL (ref 1.7–7.7)
Neutrophils Relative %: 49 %
PLATELETS: 226 10*3/uL (ref 150–400)
RBC: 4.73 MIL/uL (ref 4.22–5.81)
RDW: 17.1 % — AB (ref 11.5–15.5)
WBC: 3.4 10*3/uL — ABNORMAL LOW (ref 4.0–10.5)
nRBC: 0 % (ref 0.0–0.2)

## 2018-12-03 LAB — BASIC METABOLIC PANEL
Anion gap: 6 (ref 5–15)
BUN: 19 mg/dL (ref 8–23)
CO2: 25 mmol/L (ref 22–32)
Calcium: 8.5 mg/dL — ABNORMAL LOW (ref 8.9–10.3)
Chloride: 107 mmol/L (ref 98–111)
Creatinine, Ser: 0.88 mg/dL (ref 0.61–1.24)
GFR calc Af Amer: >60 mL/min (ref >60)
GFR calc non Af Amer: >60 mL/min (ref >60)
Glucose, Bld: 100 mg/dL — ABNORMAL HIGH (ref 70–99)
Potassium: 3.8 mmol/L (ref 3.5–5.1)
Sodium: 138 mmol/L (ref 135–145)

## 2018-12-03 LAB — SURGICAL PCR SCREEN
MRSA, PCR: NEGATIVE
Staphylococcus aureus: NEGATIVE

## 2018-12-03 NOTE — Progress Notes (Addendum)
PCP - Dr. Renford Dills Cardiologist - denies  Chest x-ray - N/A EKG - 12/03/18 Stress Test - 5+ years ago ECHO - 07/01/18 Cardiac Cath -denies   Sleep Study - OSA + CPAP - denies CPAP nightly   Blood Thinner Instructions: N/A Aspirin Instructions:N/A  Anesthesia review: Yes.  Patient denies shortness of breath, fever, cough and chest pain at PAT appointment   Patient verbalized understanding of instructions that were given to them at the PAT appointment. Patient was also instructed that they will need to review over the PAT instructions again at home before surgery.

## 2018-12-04 MED ORDER — VANCOMYCIN HCL 10 G IV SOLR
1500.0000 mg | INTRAVENOUS | Status: AC
  Administered 2018-12-07: 1500 mg via INTRAVENOUS
  Filled 2018-12-04: qty 1500

## 2018-12-04 NOTE — Anesthesia Preprocedure Evaluation (Addendum)
Anesthesia Evaluation  Patient identified by MRN, date of birth, ID band Patient awake    Reviewed: Allergy & Precautions, NPO status , Patient's Chart, lab work & pertinent test results  Airway Mallampati: III  TM Distance: >3 FB Neck ROM: Full    Dental no notable dental hx. (+) Teeth Intact, Dental Advidsory Given   Pulmonary sleep apnea , former smoker,    Pulmonary exam normal breath sounds clear to auscultation       Cardiovascular hypertension, Normal cardiovascular exam Rhythm:Regular Rate:Normal  ECG: NSR, rate 75. Left axis deviation  ECHO: LV EF: 60% - 65%   Neuro/Psych PSYCHIATRIC DISORDERS    GI/Hepatic negative GI ROS, Neg liver ROS,   Endo/Other  Morbid obesity  Renal/GU Renal disease     Musculoskeletal Burns   Abdominal (+) + obese,   Peds  Hematology  (+) Blood dyscrasia, anemia ,   Anesthesia Other Findings Spinal stenosis  Reproductive/Obstetrics                         Anesthesia Physical Anesthesia Plan  ASA: III  Anesthesia Plan: General   Post-op Pain Management:    Induction: Intravenous  PONV Risk Score and Plan: 3 and Midazolam, Dexamethasone, Ondansetron and Treatment may vary due to age or medical condition  Airway Management Planned: Oral ETT and Video Laryngoscope Planned  Additional Equipment:   Intra-op Plan:   Post-operative Plan: Extubation in OR  Informed Consent: I have reviewed the patients History and Physical, chart, labs and discussed the procedure including the risks, benefits and alternatives for the proposed anesthesia with the patient or authorized representative who has indicated his/her understanding and acceptance.     Dental advisory given  Plan Discussed with: CRNA  Anesthesia Plan Comments: (Myelopathy due to cervical and thoracic spinal stenosis. Bowel and bladder incontinence. Requires Hoyer lift for transfers. TEE 07/01/18  shows EF 60% to 65%, normal wall motion.)       Anesthesia Quick Evaluation

## 2018-12-07 ENCOUNTER — Encounter (HOSPITAL_COMMUNITY)
Admission: RE | Disposition: A | Discharge status: Rehabilitation Facility | Payer: Self-pay | Source: Home / Self Care | Attending: Neurosurgery

## 2018-12-07 ENCOUNTER — Inpatient Hospital Stay (HOSPITAL_COMMUNITY): Payer: No Typology Code available for payment source | Admitting: Physician Assistant

## 2018-12-07 ENCOUNTER — Encounter (HOSPITAL_COMMUNITY): Payer: Self-pay

## 2018-12-07 ENCOUNTER — Inpatient Hospital Stay (HOSPITAL_COMMUNITY): Payer: No Typology Code available for payment source | Admitting: Anesthesiology

## 2018-12-07 ENCOUNTER — Other Ambulatory Visit: Payer: Self-pay

## 2018-12-07 ENCOUNTER — Inpatient Hospital Stay (HOSPITAL_COMMUNITY): Payer: No Typology Code available for payment source

## 2018-12-07 ENCOUNTER — Inpatient Hospital Stay (HOSPITAL_COMMUNITY)
Admission: RE | Admit: 2018-12-07 | Discharge: 2018-12-10 | DRG: 472 | Disposition: A | Discharge status: Rehabilitation Facility | Payer: No Typology Code available for payment source | Attending: Neurosurgery | Admitting: Neurosurgery

## 2018-12-07 DIAGNOSIS — Z419 Encounter for procedure for purposes other than remedying health state, unspecified: Secondary | ICD-10-CM

## 2018-12-07 DIAGNOSIS — M4804 Spinal stenosis, thoracic region: Secondary | ICD-10-CM | POA: Diagnosis present

## 2018-12-07 DIAGNOSIS — M4802 Spinal stenosis, cervical region: Principal | ICD-10-CM | POA: Diagnosis present

## 2018-12-07 DIAGNOSIS — M2578 Osteophyte, vertebrae: Secondary | ICD-10-CM | POA: Diagnosis present

## 2018-12-07 DIAGNOSIS — M5412 Radiculopathy, cervical region: Secondary | ICD-10-CM | POA: Diagnosis present

## 2018-12-07 DIAGNOSIS — G992 Myelopathy in diseases classified elsewhere: Secondary | ICD-10-CM | POA: Diagnosis present

## 2018-12-07 DIAGNOSIS — G4733 Obstructive sleep apnea (adult) (pediatric): Secondary | ICD-10-CM | POA: Diagnosis present

## 2018-12-07 DIAGNOSIS — Z87891 Personal history of nicotine dependence: Secondary | ICD-10-CM | POA: Diagnosis not present

## 2018-12-07 DIAGNOSIS — Z881 Allergy status to other antibiotic agents status: Secondary | ICD-10-CM

## 2018-12-07 DIAGNOSIS — I129 Hypertensive chronic kidney disease with stage 1 through stage 4 chronic kidney disease, or unspecified chronic kidney disease: Secondary | ICD-10-CM | POA: Diagnosis present

## 2018-12-07 DIAGNOSIS — Z791 Long term (current) use of non-steroidal anti-inflammatories (NSAID): Secondary | ICD-10-CM

## 2018-12-07 DIAGNOSIS — Z79899 Other long term (current) drug therapy: Secondary | ICD-10-CM | POA: Diagnosis not present

## 2018-12-07 DIAGNOSIS — N189 Chronic kidney disease, unspecified: Secondary | ICD-10-CM | POA: Diagnosis present

## 2018-12-07 DIAGNOSIS — M6281 Muscle weakness (generalized): Secondary | ICD-10-CM | POA: Diagnosis present

## 2018-12-07 DIAGNOSIS — Z6841 Body Mass Index (BMI) 40.0 and over, adult: Secondary | ICD-10-CM

## 2018-12-07 DIAGNOSIS — G959 Disease of spinal cord, unspecified: Secondary | ICD-10-CM | POA: Diagnosis present

## 2018-12-07 DIAGNOSIS — M810 Age-related osteoporosis without current pathological fracture: Secondary | ICD-10-CM | POA: Diagnosis present

## 2018-12-07 HISTORY — PX: ANTERIOR CERVICAL DECOMP/DISCECTOMY FUSION: SHX1161

## 2018-12-07 SURGERY — ANTERIOR CERVICAL DECOMPRESSION/DISCECTOMY FUSION 3 LEVELS
Anesthesia: General | Site: Spine Cervical

## 2018-12-07 MED ORDER — HYDROCODONE-ACETAMINOPHEN 10-325 MG PO TABS
2.0000 | ORAL_TABLET | ORAL | Status: DC | PRN
  Administered 2018-12-07 – 2018-12-10 (×7): 2 via ORAL
  Filled 2018-12-07 (×7): qty 2

## 2018-12-07 MED ORDER — PANTOPRAZOLE SODIUM 40 MG PO TBEC
40.0000 mg | DELAYED_RELEASE_TABLET | Freq: Every day | ORAL | Status: DC
  Administered 2018-12-07 – 2018-12-10 (×4): 40 mg via ORAL
  Filled 2018-12-07 (×4): qty 1

## 2018-12-07 MED ORDER — SODIUM CHLORIDE 0.9% FLUSH
3.0000 mL | Freq: Two times a day (BID) | INTRAVENOUS | Status: DC
  Administered 2018-12-07 – 2018-12-09 (×4): 3 mL via INTRAVENOUS

## 2018-12-07 MED ORDER — GLYCOPYRROLATE PF 0.2 MG/ML IJ SOSY
PREFILLED_SYRINGE | INTRAMUSCULAR | Status: DC | PRN
  Administered 2018-12-07: .2 mg via INTRAVENOUS

## 2018-12-07 MED ORDER — ACETAMINOPHEN 650 MG RE SUPP: 650.0000 mg | RECTAL | Status: DC | PRN

## 2018-12-07 MED ORDER — THROMBIN 5000 UNITS EX SOLR
OROMUCOSAL | Status: DC | PRN
  Administered 2018-12-07: 5 mL via TOPICAL

## 2018-12-07 MED ORDER — HYDROMORPHONE HCL 1 MG/ML IJ SOLN
0.2500 mg | INTRAMUSCULAR | Status: DC | PRN
  Administered 2018-12-07 (×2): 0.5 mg via INTRAVENOUS

## 2018-12-07 MED ORDER — SUGAMMADEX SODIUM 500 MG/5ML IV SOLN
INTRAVENOUS | Status: DC | PRN
  Administered 2018-12-07: 276.6 mg via INTRAVENOUS

## 2018-12-07 MED ORDER — ROCURONIUM BROMIDE 50 MG/5ML IV SOSY
PREFILLED_SYRINGE | INTRAVENOUS | Status: AC
  Filled 2018-12-07: qty 10

## 2018-12-07 MED ORDER — GLYCOPYRROLATE PF 0.2 MG/ML IJ SOSY
PREFILLED_SYRINGE | INTRAMUSCULAR | Status: AC
  Filled 2018-12-07: qty 1

## 2018-12-07 MED ORDER — DEXAMETHASONE SODIUM PHOSPHATE 10 MG/ML IJ SOLN
INTRAMUSCULAR | Status: AC
  Filled 2018-12-07: qty 1

## 2018-12-07 MED ORDER — ROCURONIUM BROMIDE 50 MG/5ML IV SOSY
PREFILLED_SYRINGE | INTRAVENOUS | Status: DC | PRN
  Administered 2018-12-07: 20 mg via INTRAVENOUS
  Administered 2018-12-07: 50 mg via INTRAVENOUS
  Administered 2018-12-07: 20 mg via INTRAVENOUS
  Administered 2018-12-07: 10 mg via INTRAVENOUS

## 2018-12-07 MED ORDER — ACETAMINOPHEN 500 MG PO TABS
1000.0000 mg | ORAL_TABLET | Freq: Once | ORAL | Status: AC
  Administered 2018-12-07: 1000 mg via ORAL

## 2018-12-07 MED ORDER — FERROUS GLUCONATE 324 (38 FE) MG PO TABS
324.0000 mg | ORAL_TABLET | Freq: Three times a day (TID) | ORAL | Status: DC
  Administered 2018-12-07 – 2018-12-10 (×10): 324 mg via ORAL
  Filled 2018-12-07 (×10): qty 1

## 2018-12-07 MED ORDER — LIDOCAINE 2% (20 MG/ML) 5 ML SYRINGE
INTRAMUSCULAR | Status: AC
  Filled 2018-12-07: qty 10

## 2018-12-07 MED ORDER — THROMBIN 20000 UNITS EX SOLR
CUTANEOUS | Status: DC | PRN
  Administered 2018-12-07: 20 mL via TOPICAL

## 2018-12-07 MED ORDER — SODIUM CHLORIDE 0.9 % IV SOLN
INTRAVENOUS | Status: DC | PRN
  Administered 2018-12-07: 30 ug/min via INTRAVENOUS

## 2018-12-07 MED ORDER — CHLORHEXIDINE GLUCONATE CLOTH 2 % EX PADS: 6.0000 | MEDICATED_PAD | Freq: Once | CUTANEOUS | Status: DC

## 2018-12-07 MED ORDER — SODIUM CHLORIDE 0.9 % IV SOLN
INTRAVENOUS | Status: DC | PRN
  Administered 2018-12-07: 500 mL

## 2018-12-07 MED ORDER — OXYCODONE HCL 5 MG/5ML PO SOLN: 5.0000 mg | Freq: Once | ORAL | Status: DC | PRN

## 2018-12-07 MED ORDER — FENTANYL CITRATE (PF) 250 MCG/5ML IJ SOLN
INTRAMUSCULAR | Status: AC
  Filled 2018-12-07: qty 5

## 2018-12-07 MED ORDER — HYDROMORPHONE HCL 1 MG/ML IJ SOLN
INTRAMUSCULAR | Status: AC
  Administered 2018-12-07: 0.5 mg via INTRAVENOUS
  Filled 2018-12-07: qty 1

## 2018-12-07 MED ORDER — SUGAMMADEX SODIUM 500 MG/5ML IV SOLN
INTRAVENOUS | Status: AC
  Filled 2018-12-07: qty 5

## 2018-12-07 MED ORDER — ONDANSETRON HCL 4 MG/2ML IJ SOLN
INTRAMUSCULAR | Status: DC | PRN
  Administered 2018-12-07: 4 mg via INTRAVENOUS

## 2018-12-07 MED ORDER — ONDANSETRON HCL 4 MG/2ML IJ SOLN
INTRAMUSCULAR | Status: AC
  Filled 2018-12-07: qty 4

## 2018-12-07 MED ORDER — PHENOL 1.4 % MT LIQD
1.0000 | OROMUCOSAL | Status: DC | PRN
  Administered 2018-12-09: 1 via OROMUCOSAL
  Filled 2018-12-07: qty 177

## 2018-12-07 MED ORDER — TAMSULOSIN HCL 0.4 MG PO CAPS
0.4000 mg | ORAL_CAPSULE | Freq: Every day | ORAL | Status: DC
  Administered 2018-12-07 – 2018-12-10 (×4): 0.4 mg via ORAL
  Filled 2018-12-07 (×4): qty 1

## 2018-12-07 MED ORDER — VANCOMYCIN HCL 10 G IV SOLR
2000.0000 mg | Freq: Once | INTRAVENOUS | Status: AC
  Administered 2018-12-07: 2000 mg via INTRAVENOUS
  Filled 2018-12-07: qty 2000

## 2018-12-07 MED ORDER — PROMETHAZINE HCL 25 MG/ML IJ SOLN: 6.2500 mg | INTRAMUSCULAR | Status: DC | PRN

## 2018-12-07 MED ORDER — OXYCODONE HCL 5 MG PO TABS: 5.0000 mg | ORAL_TABLET | Freq: Once | ORAL | Status: DC | PRN

## 2018-12-07 MED ORDER — ACETAMINOPHEN 500 MG PO TABS
ORAL_TABLET | ORAL | Status: AC
  Administered 2018-12-07: 1000 mg via ORAL
  Filled 2018-12-07: qty 2

## 2018-12-07 MED ORDER — PROPOFOL 10 MG/ML IV BOLUS
INTRAVENOUS | Status: AC
  Filled 2018-12-07: qty 20

## 2018-12-07 MED ORDER — HYDRALAZINE HCL 20 MG/ML IJ SOLN: 5.0000 mg | INTRAMUSCULAR | Status: DC | PRN

## 2018-12-07 MED ORDER — SODIUM CHLORIDE 0.9 % IV SOLN: 250.0000 mL | INTRAVENOUS | Status: DC

## 2018-12-07 MED ORDER — ONDANSETRON HCL 4 MG PO TABS: 4.0000 mg | ORAL_TABLET | Freq: Four times a day (QID) | ORAL | Status: DC | PRN

## 2018-12-07 MED ORDER — DULOXETINE HCL 60 MG PO CPEP
60.0000 mg | ORAL_CAPSULE | Freq: Every day | ORAL | Status: DC
  Administered 2018-12-07 – 2018-12-10 (×4): 60 mg via ORAL
  Filled 2018-12-07 (×4): qty 1

## 2018-12-07 MED ORDER — FENTANYL CITRATE (PF) 100 MCG/2ML IJ SOLN
INTRAMUSCULAR | Status: DC | PRN
  Administered 2018-12-07: 50 ug via INTRAVENOUS
  Administered 2018-12-07: 150 ug via INTRAVENOUS

## 2018-12-07 MED ORDER — SODIUM CHLORIDE 0.9% FLUSH: 3.0000 mL | INTRAVENOUS | Status: DC | PRN

## 2018-12-07 MED ORDER — PROPOFOL 10 MG/ML IV BOLUS
INTRAVENOUS | Status: DC | PRN
  Administered 2018-12-07: 150 mg via INTRAVENOUS

## 2018-12-07 MED ORDER — LACTATED RINGERS IV SOLN
INTRAVENOUS | Status: DC | PRN
  Administered 2018-12-07 (×2): via INTRAVENOUS

## 2018-12-07 MED ORDER — MENTHOL 3 MG MT LOZG
1.0000 | LOZENGE | OROMUCOSAL | Status: DC | PRN
  Administered 2018-12-09: 3 mg via ORAL
  Filled 2018-12-07 (×2): qty 9

## 2018-12-07 MED ORDER — PHENYLEPHRINE HCL 10 MG/ML IJ SOLN
INTRAMUSCULAR | Status: AC
  Filled 2018-12-07: qty 1

## 2018-12-07 MED ORDER — CYCLOBENZAPRINE HCL 10 MG PO TABS
10.0000 mg | ORAL_TABLET | Freq: Three times a day (TID) | ORAL | Status: DC | PRN
  Administered 2018-12-09 – 2018-12-10 (×4): 10 mg via ORAL
  Filled 2018-12-07 (×4): qty 1

## 2018-12-07 MED ORDER — MIDAZOLAM HCL 2 MG/2ML IJ SOLN
INTRAMUSCULAR | Status: AC
  Filled 2018-12-07: qty 2

## 2018-12-07 MED ORDER — LIDOCAINE 2% (20 MG/ML) 5 ML SYRINGE
INTRAMUSCULAR | Status: DC | PRN
  Administered 2018-12-07: 60 mg via INTRAVENOUS

## 2018-12-07 MED ORDER — ACETAMINOPHEN 325 MG PO TABS: 650.0000 mg | ORAL_TABLET | ORAL | Status: DC | PRN

## 2018-12-07 MED ORDER — 0.9 % SODIUM CHLORIDE (POUR BTL) OPTIME
TOPICAL | Status: DC | PRN
  Administered 2018-12-07: 1000 mL

## 2018-12-07 MED ORDER — THROMBIN 20000 UNITS EX SOLR
CUTANEOUS | Status: AC
  Filled 2018-12-07: qty 20000

## 2018-12-07 MED ORDER — HYDROMORPHONE HCL 1 MG/ML IJ SOLN: 1.0000 mg | INTRAMUSCULAR | Status: DC | PRN

## 2018-12-07 MED ORDER — DEXAMETHASONE SODIUM PHOSPHATE 10 MG/ML IJ SOLN
10.0000 mg | INTRAMUSCULAR | Status: AC
  Administered 2018-12-07: 10 mg via INTRAVENOUS

## 2018-12-07 MED ORDER — ONDANSETRON HCL 4 MG/2ML IJ SOLN: 4.0000 mg | Freq: Four times a day (QID) | INTRAMUSCULAR | Status: DC | PRN

## 2018-12-07 MED ORDER — HYDROCODONE-ACETAMINOPHEN 5-325 MG PO TABS
1.0000 | ORAL_TABLET | ORAL | Status: DC | PRN
  Administered 2018-12-10: 1 via ORAL
  Filled 2018-12-07: qty 1

## 2018-12-07 MED ORDER — KETOROLAC TROMETHAMINE 30 MG/ML IJ SOLN: 30.0000 mg | Freq: Once | INTRAMUSCULAR | Status: DC | PRN

## 2018-12-07 MED ORDER — LACTATED RINGERS IV SOLN
INTRAVENOUS | Status: DC
  Administered 2018-12-07: 10:00:00 via INTRAVENOUS

## 2018-12-07 MED ORDER — MIDAZOLAM HCL 5 MG/5ML IJ SOLN
INTRAMUSCULAR | Status: DC | PRN
  Administered 2018-12-07: 2 mg via INTRAVENOUS

## 2018-12-07 MED ORDER — THROMBIN 5000 UNITS EX SOLR
CUTANEOUS | Status: AC
  Filled 2018-12-07: qty 5000

## 2018-12-07 SURGICAL SUPPLY — 69 items
BAG DECANTER FOR FLEXI CONT (MISCELLANEOUS) ×3 IMPLANT
BENZOIN TINCTURE PRP APPL 2/3 (GAUZE/BANDAGES/DRESSINGS) ×3 IMPLANT
BIT DRILL 13 (BIT) ×1 IMPLANT
BIT DRILL 13MM (BIT) ×1
BUR MATCHSTICK NEURO 3.0 LAGG (BURR) ×3 IMPLANT
CAGE PEEK 6X14X11 (Cage) ×2 IMPLANT
CAGE PEEK 7X14X11 (Cage) ×2 IMPLANT
CAGE PEEK 8X14X11 (Cage) ×2 IMPLANT
CANISTER SUCT 3000ML PPV (MISCELLANEOUS) ×3 IMPLANT
CARTRIDGE OIL MAESTRO DRILL (MISCELLANEOUS) ×1 IMPLANT
CLOSURE STERI-STRIP 1/2X4 (GAUZE/BANDAGES/DRESSINGS) ×1
CLOSURE WOUND 1/2 X4 (GAUZE/BANDAGES/DRESSINGS) ×1
CLSR STERI-STRIP ANTIMIC 1/2X4 (GAUZE/BANDAGES/DRESSINGS) ×1 IMPLANT
COVER WAND RF STERILE (DRAPES) ×3 IMPLANT
DERMABOND ADVANCED (GAUZE/BANDAGES/DRESSINGS) ×2
DERMABOND ADVANCED .7 DNX12 (GAUZE/BANDAGES/DRESSINGS) IMPLANT
DIFFUSER DRILL AIR PNEUMATIC (MISCELLANEOUS) ×3 IMPLANT
DRAPE C-ARM 42X72 X-RAY (DRAPES) ×6 IMPLANT
DRAPE LAPAROTOMY 100X72 PEDS (DRAPES) ×3 IMPLANT
DRAPE MICROSCOPE LEICA (MISCELLANEOUS) ×3 IMPLANT
DURAPREP 6ML APPLICATOR 50/CS (WOUND CARE) ×3 IMPLANT
ELECT COATED BLADE 2.86 ST (ELECTRODE) ×3 IMPLANT
ELECT REM PT RETURN 9FT ADLT (ELECTROSURGICAL) ×3
ELECTRODE REM PT RTRN 9FT ADLT (ELECTROSURGICAL) ×1 IMPLANT
GAUZE 4X4 16PLY RFD (DISPOSABLE) IMPLANT
GAUZE SPONGE 4X4 12PLY STRL (GAUZE/BANDAGES/DRESSINGS) ×3 IMPLANT
GLOVE BIO SURGEON STRL SZ 6.5 (GLOVE) ×4 IMPLANT
GLOVE BIO SURGEONS STRL SZ 6.5 (GLOVE) ×4
GLOVE BIOGEL PI IND STRL 6.5 (GLOVE) IMPLANT
GLOVE BIOGEL PI IND STRL 7.0 (GLOVE) IMPLANT
GLOVE BIOGEL PI INDICATOR 6.5 (GLOVE) ×8
GLOVE BIOGEL PI INDICATOR 7.0 (GLOVE) ×2
GLOVE ECLIPSE 9.0 STRL (GLOVE) ×3 IMPLANT
GLOVE EXAM NITRILE XL STR (GLOVE) IMPLANT
GLOVE SURG SS PI 6.0 STRL IVOR (GLOVE) ×4 IMPLANT
GOWN STRL REUS W/ TWL LRG LVL3 (GOWN DISPOSABLE) IMPLANT
GOWN STRL REUS W/ TWL XL LVL3 (GOWN DISPOSABLE) IMPLANT
GOWN STRL REUS W/TWL 2XL LVL3 (GOWN DISPOSABLE) IMPLANT
GOWN STRL REUS W/TWL LRG LVL3 (GOWN DISPOSABLE) ×6
GOWN STRL REUS W/TWL XL LVL3 (GOWN DISPOSABLE) ×2
HALTER HD/CHIN CERV TRACTION D (MISCELLANEOUS) ×3 IMPLANT
HEMOSTAT POWDER KIT SURGIFOAM (HEMOSTASIS) ×3 IMPLANT
KIT BASIN OR (CUSTOM PROCEDURE TRAY) ×3 IMPLANT
KIT TURNOVER KIT B (KITS) ×3 IMPLANT
NDL SPNL 20GX3.5 QUINCKE YW (NEEDLE) ×1 IMPLANT
NEEDLE SPNL 20GX3.5 QUINCKE YW (NEEDLE) ×3 IMPLANT
NS IRRIG 1000ML POUR BTL (IV SOLUTION) ×3 IMPLANT
OIL CARTRIDGE MAESTRO DRILL (MISCELLANEOUS) ×3
PACK LAMINECTOMY NEURO (CUSTOM PROCEDURE TRAY) ×3 IMPLANT
PAD ARMBOARD 7.5X6 YLW CONV (MISCELLANEOUS) ×9 IMPLANT
PLATE 3 60XNS SPNE CVD ANT T (Plate) IMPLANT
PLATE 3 ATLANTIS TRANS (Plate) ×2 IMPLANT
RUBBERBAND STERILE (MISCELLANEOUS) ×6 IMPLANT
SCREW ST FIX 4 ATL 3120213 (Screw) ×16 IMPLANT
SPACER SPNL 11X14X6XPEEK CVD (Cage) IMPLANT
SPACER SPNL 11X14X7XPEEK CVD (Cage) IMPLANT
SPCR SPNL 11X14X6XPEEK CVD (Cage) ×1 IMPLANT
SPCR SPNL 11X14X7XPEEK CVD (Cage) ×1 IMPLANT
SPONGE INTESTINAL PEANUT (DISPOSABLE) ×3 IMPLANT
SPONGE SURGIFOAM ABS GEL 100 (HEMOSTASIS) ×3 IMPLANT
STRIP CLOSURE SKIN 1/2X4 (GAUZE/BANDAGES/DRESSINGS) ×2 IMPLANT
SUT VIC AB 3-0 SH 8-18 (SUTURE) ×3 IMPLANT
SUT VIC AB 4-0 RB1 18 (SUTURE) ×3 IMPLANT
TAPE CLOTH 4X10 WHT NS (GAUZE/BANDAGES/DRESSINGS) ×3 IMPLANT
TAPE CLOTH SURG 4X10 WHT LF (GAUZE/BANDAGES/DRESSINGS) ×2 IMPLANT
TOWEL GREEN STERILE (TOWEL DISPOSABLE) ×3 IMPLANT
TOWEL GREEN STERILE FF (TOWEL DISPOSABLE) ×3 IMPLANT
TRAP SPECIMEN MUCOUS 40CC (MISCELLANEOUS) ×3 IMPLANT
WATER STERILE IRR 1000ML POUR (IV SOLUTION) ×3 IMPLANT

## 2018-12-07 NOTE — Transfer of Care (Signed)
Immediate Anesthesia Transfer of Care Note  Patient: [name] [name]  Procedure(s) Performed: ANTERIOR CERVICAL DECOMPRESSION FUSION - CERVICAL THREE-CERVICAL FOUR, CERVICAL FOUR-CERVICAL FIVE, CERVICAL FIVE-CERVICAL SIX (N/A Spine Cervical)  Patient Location: PACU  Anesthesia Type:General  Level of Consciousness: awake, alert  and oriented  Airway & Oxygen Therapy: Patient Spontanous Breathing and Patient connected to face mask oxygen  Post-op Assessment: Report given to RN and Post -op Vital signs reviewed and stable  Post vital signs: Reviewed and stable  Last Vitals:  Vitals Value Taken Time  BP 133/87 12/07/2018  2:45 PM  Temp 36.5 C 12/07/2018  2:44 PM  Pulse 60 12/07/2018  2:47 PM  Resp 15 12/07/2018  2:47 PM  SpO2 100 % 12/07/2018  2:47 PM  Vitals shown include unvalidated device data.  Last Pain:  Vitals:   12/07/18 1444  TempSrc:   PainSc: 0-No pain         Complications: No apparent anesthesia complications

## 2018-12-07 NOTE — Progress Notes (Signed)
Orthopedic Tech Progress Note Patient Details:  [name] [name] September 29, 1956 [id number] RN said patient has on collar already Patient ID: [name] [name], male   DOB: 1956/05/18, 63 y.o.   MRN: [id number]   Donald Pore 12/07/2018, 3:49 PM

## 2018-12-07 NOTE — Progress Notes (Signed)
Dr. Bradley Ferris present in pt. Room, made aware of pt. elevated BP, BP re-taken 171/90. Pt. asymptomatic. Orders received, will continue to monitor.

## 2018-12-07 NOTE — Anesthesia Postprocedure Evaluation (Signed)
Anesthesia Post Note  Patient: [name] [name]  Procedure(s) Performed: ANTERIOR CERVICAL DECOMPRESSION FUSION - CERVICAL THREE-CERVICAL FOUR, CERVICAL FOUR-CERVICAL FIVE, CERVICAL FIVE-CERVICAL SIX (N/A Spine Cervical)     Patient location during evaluation: PACU Anesthesia Type: General Level of consciousness: awake and alert Pain management: pain level controlled Vital Signs Assessment: post-procedure vital signs reviewed and stable Respiratory status: spontaneous breathing, nonlabored ventilation and respiratory function stable Cardiovascular status: blood pressure returned to baseline and stable Postop Assessment: no apparent nausea or vomiting Anesthetic complications: no    Last Vitals:  Vitals:   12/07/18 1514 12/07/18 1556  BP: 138/77 (!) 141/97  Pulse: 67 74  Resp: 10 20  Temp:  36.6 C  SpO2: 95% 100%    Last Pain:  Vitals:   12/07/18 1556  TempSrc: Oral  PainSc:                  Beryle Lathe

## 2018-12-07 NOTE — Op Note (Signed)
Date of procedure: 12/07/2018  Date of dictation: Same  Service: Neurosurgery  Preoperative diagnosis: Cervical stenosis with myelopathy  Postoperative diagnosis: Same  Procedure Name: C3-4, C4-5, C5-6 anterior cervical discectomy with interbody fusion utilizing interbody peek cage, locally harvested autograft, and anterior plate instrumentation  Surgeon:Seymone Forlenza A.Robi Mitter, M.D.  Asst. Surgeon: Doran Durand, NP  Anesthesia: General  Indication: 63 year old male with multiple medical and spinal issues.  The patient has been nonambulatory for more than 1 year.  He is status post lumbar decompressive surgery by another physician which was complicated by postoperative infection.  Patient has been treated with antibiotics and the infection is now felt to be resolved.  Patient also with severe cervical and thoracic stenosis with high signal abnormality both in his cervical and thoracic cord.  Patient presents now for decompression and fusion in his cervical spine in hopes of improving his symptoms.  Operative note: After induction of anesthesia, patient position supine with neck slightly extended and held placed halter traction.  Patient's anterior cervical region prepped and draped sterilely.  Incision made overlying C4-5.  Dissection performed on the right.  Retractor placed.  Fluoroscopy used.  Levels confirmed.  Displaces at C3-4, C4-5 and C5-6 were incised.  Discectomy was then performed using various instruments down to level the posterior annulus.  Microscope was brought to the field used throughout the remainder of the discectomies.  Starting first at C3-4 remaining aspects of annulus and osteophytes removed down to level the posterior logical ligament.  Posterior logical is not elevated and resected piecemeal fashion.  A wide central decompression then performed undercutting the bodies of C3 and C4.  Decompression then proceeded into each neural foramina.  Wide anterior foraminotomies were performed on  the course exiting nerve roots bilaterally.  At this point a very thorough decompression had been achieved.  There was no evidence of injury to thecal sac and nerve roots.  Procedure then repeated in a similar fashion at both C4-5 and C5-6 again achieving wide central decompression and extensive foraminal decompression.  Wound was then irrigated fanlike solution.  Gelfoam was placed topically for hemostasis then removed.  [employer] anatomic peek cages packed with locally harvested autograft were then impacted in place at all 3 levels.  Each cage was recessed slightly from the anterior cortical margin.  Medtronic Atlantis translational plate was then placed over the C3, C4, C5 and C6 levels.  This attached under fluoroscopic guidance using 13 mm fixed angle screws to each at all 4 levels.  All screws given final tightening found to be solidly within the bone.  Locking screws were engaged in all levels.  Final images reveal good position of the hardware and the cages the proper operative level with normal alignment of spine.  Wound is then irrigated with an antibiotic solution.  Hemostasis was achieved with bipolar trocar.  Wounds and closed in layers with Vicryl sutures.  Steri-Strips and sterile dressing were applied.  No apparent complications.  Patient tolerated the procedure well and he returns to the recovery room postop.

## 2018-12-07 NOTE — Anesthesia Procedure Notes (Signed)
Procedure Name: Intubation Date/Time: 12/07/2018 11:25 AM Performed by: Carmela Rima, CRNA Pre-anesthesia Checklist: Timeout performed, Patient being monitored, Suction available, Emergency Drugs available and Patient identified Patient Re-evaluated:Patient Re-evaluated prior to induction Oxygen Delivery Method: Circle system utilized Preoxygenation: Pre-oxygenation with 100% oxygen Induction Type: IV induction Ventilation: Mask ventilation without difficulty and Oral airway inserted - appropriate to patient size Laryngoscope Size: Glidescope and 4 Grade View: Grade I Tube type: Oral Tube size: 7.5 mm Number of attempts: 1 Placement Confirmation: breath sounds checked- equal and bilateral,  positive ETCO2 and ETT inserted through vocal cords under direct vision Secured at: 24 cm Tube secured with: Tape Dental Injury: Teeth and Oropharynx as per pre-operative assessment

## 2018-12-07 NOTE — Brief Op Note (Signed)
12/07/2018  2:32 PM  PATIENT:  [name] [name]  63 y.o. male  PRE-OPERATIVE DIAGNOSIS:  Stenosis  POST-OPERATIVE DIAGNOSIS:  Stenosis  PROCEDURE:  Procedure(s): ANTERIOR CERVICAL DECOMPRESSION FUSION - CERVICAL THREE-CERVICAL FOUR, CERVICAL FOUR-CERVICAL FIVE, CERVICAL FIVE-CERVICAL SIX (N/A)  SURGEON:  Surgeon(s) and Role:    * Julio Sicks, MD - Primary  PHYSICIAN ASSISTANT:   ASSISTANTSDoran Durand, NP   ANESTHESIA:   general  EBL:  250 mL   BLOOD ADMINISTERED:none  DRAINS: none   LOCAL MEDICATIONS USED:  NONE  SPECIMEN:  No Specimen  DISPOSITION OF SPECIMEN:  N/A  COUNTS:  YES  TOURNIQUET:  * No tourniquets in log *  DICTATION: .Dragon Dictation  PLAN OF CARE: Admit to inpatient   PATIENT DISPOSITION:  PACU - hemodynamically stable.   Delay start of Pharmacological VTE agent (>24hrs) due to surgical blood loss or risk of bleeding: yes
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
## 2018-12-07 NOTE — Progress Notes (Signed)
Pharmacy Antibiotic Note  [name] [name] is a 63 y.o. male admitted on 12/07/2018 with surgical prophylaxis.  Pharmacy has been consulted for Vancomycin dosing due to penicillin allergy.  Patient only requires 1 dose post-op since no drains were placed per Brief Op Note on 02/24,  Plan: Vancomycin 2000 mg IV x1 at 2345 tonight  Height: 5\' 10"  (177.8 cm) Weight: (!) 305 lb (138.3 kg) IBW/kg (Calculated) : 73  Temp (24hrs), Avg:98 F (36.7 C), Min:97.7 F (36.5 C), Max:98.2 F (36.8 C)  Recent Labs  Lab 12/03/18 1509  WBC 3.4*  CREATININE 0.88    Estimated Creatinine Clearance: 123.6 mL/min (by C-G formula based on SCr of 0.88 mg/dL).    Allergies  Allergen Reactions  . Unasyn [Ampicillin-Sulbactam Sodium] Hives, Itching, Swelling and Rash   Thank you for allowing pharmacy to be a part of this patient's care.  Jeanella Cara, PharmD, [facility] Clinical Pharmacist Please see AMION for all Pharmacists' Contact Phone Numbers 12/07/2018, 3:48 PM

## 2018-12-08 ENCOUNTER — Encounter (HOSPITAL_COMMUNITY): Payer: Self-pay | Admitting: Neurosurgery
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
## 2018-12-08 NOTE — Progress Notes (Signed)
Rehab Admissions Coordinator Note:  Patient was screened by Trish Mage for appropriateness for an Inpatient Acute Rehab Consult.  At this time, we are recommending Inpatient Rehab consult.  [name] M 12/08/2018, 9:52 AM  I can be reached at [phone].
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
## 2018-12-08 NOTE — Progress Notes (Signed)
   Providing Compassionate, Quality Care - Together   Subjective: Patient reports he is "doing really good." He worked with physical therapy this morning and was able to stand using a steady. He is very pleased with his progress. He denies pain. He feels his strength is improving.  Objective: Vital signs in last 24 hours: Temp:  [97.6 F (36.4 C)-98 F (36.7 C)] 98 F (36.7 C) (02/25 1132) Pulse Rate:  [58-94] 78 (02/25 1132) Resp:  [10-20] 16 (02/25 0832) BP: (114-141)/(77-97) 123/84 (02/25 1132) SpO2:  [95 %-100 %] 100 % (02/25 1132)  Intake/Output from previous day: 02/24 0701 - 02/25 0700 In: 1500 [I.V.:1500] Out: 800 [Urine:550; Blood:250] Intake/Output this shift: Total I/O In: 240 [P.O.:240] Out: -   Alert and oriented x4 MAE, Strength 4-/5 LUE, 4+/5 RUE, 3/5 BLE PERRLA Incision is covered with gauze; Dressing is clean, dry, and intact  Lab Results: No results for input(s): WBC, HGB, HCT, PLT in the last 72 hours. BMET No results for input(s): NA, K, CL, CO2, GLUCOSE, BUN, CREATININE, CALCIUM in the last 72 hours.  Studies/Results: Dg Cervical Spine 2-3 Views  Result Date: 12/07/2018 CLINICAL DATA:  Surgical anterior fusion from C3-C6. EXAM: DG C-ARM 61-120 MIN; CERVICAL SPINE - 2-3 VIEW FLUOROSCOPY TIME:  6 seconds. COMPARISON:  Radiographs of June 28, 2018. FINDINGS: Two intraoperative fluoroscopic images demonstrate the patient be status post surgical anterior fusion of C3-4, C4-5 and C5-6. Good alignment of the vertebral bodies is noted. IMPRESSION: Status post surgical fusion at multiple levels as described above. Electronically Signed   By: Lupita Raider, M.D.   On: 12/07/2018 15:40   Dg C-arm 1-60 Min  Result Date: 12/07/2018 CLINICAL DATA:  Surgical anterior fusion from C3-C6. EXAM: DG C-ARM 61-120 MIN; CERVICAL SPINE - 2-3 VIEW FLUOROSCOPY TIME:  6 seconds. COMPARISON:  Radiographs of June 28, 2018. FINDINGS: Two intraoperative fluoroscopic  images demonstrate the patient be status post surgical anterior fusion of C3-4, C4-5 and C5-6. Good alignment of the vertebral bodies is noted. IMPRESSION: Status post surgical fusion at multiple levels as described above. Electronically Signed   By: Lupita Raider, M.D.   On: 12/07/2018 15:40    Assessment/Plan: [name] [name] is one day s/p C3-4, C4-5, C5-6 anterior cervical discectomy with interbody fusion utilizing interbody peek cage, locally harvested autograft, and anterior plate instrumentation. He is doing very well following this procedure.   LOS: 1 day    -Continue mobilization -Recommendation is to discharge to CIR per physical therapy   Val Eagle, DNP, AGNP-C Nurse Practitioner  [facility] [address], [place], [place] [address] P: [phone]    F: [phone]  12/08/2018, 11:52 AM
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
## 2018-12-09 NOTE — Progress Notes (Signed)
Nutrition Brief Note  Patient identified on the Malnutrition Screening Tool (MST) Report  Wt Readings from Last 15 Encounters:  12/07/18 (!) 138.3 kg  12/03/18 (!) 138.3 kg  09/21/18 (!) 140.6 kg  08/27/18 (!) 141.3 kg  08/26/18 (!) 140.9 kg  08/25/18 (!) 140.9 kg  08/20/18 123.5 kg  08/10/18 123.5 kg  08/04/18 123.5 kg  06/28/18 (!) 147.8 kg  02/16/18 (!) 144.2 kg  01/15/18 (!) 144.2 kg  12/11/17 (!) 144.2 kg  12/04/17 (!) 145.2 kg  11/27/17 (!) 145.2 kg    Body mass index is 43.76 kg/m. Patient meets criteria for morbid obesity based on current BMI. Pt with no significant weight loss per weight records.   Current diet order is regular, patient is consuming approximately 100% of meals at this time. Pt reports having a good appetite currently and PTA with no other difficulties. Labs and medications reviewed.   No nutrition interventions warranted at this time. If nutrition issues arise, please consult RD.   Roslyn Smiling, MS, RD, LDN Pager # [phone] After hours/ weekend pager # [phone]
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
## 2018-12-09 NOTE — Progress Notes (Signed)
Postop day 2.  Patient's pain minimal.  Upper and lower extremity strength and sensation are much improved from preop.  Able to stand and take a couple steps yesterday.  He is afebrile.  His vital signs are stable.  Urine output is good.  He is awake and alert.  He is oriented and appropriate.  Speech is fluent.  Motor examination with 3/5 left deltoid strength.  4/5 left bicep strength.  Grips and intrinsics much stronger in both hands.  Lower extremity strength improved to 4+/5.  Wound clean and dry.  Chest and abdomen benign.  Overall progressing well following multilevel anterior cervical decompression and fusion.  Continue efforts at mobilization and rehab.  Awaiting determination as to whether patient is able to undergo inpatient rehabilitation
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
## 2018-12-09 NOTE — Progress Notes (Signed)
Inpatient Rehab Admissions:  Inpatient Rehab consult received.  I met with patient at bedside for assessment.  He is familiar with CIR from previous stay and would like to pursue another admission pending insurance approval.  He gave permission to speak with his wife via telephone to confirm goals/expectations of inpatient rehab stay.  I will begin insurance authorization and will follow up tomorrow.    [name], PT, DPT Admissions Coordination 12/09/18 11:47 AM

## 2018-12-09 NOTE — Care Management Note (Signed)
Case Management Note  Patient Details  Name: [name] [name] MRN: [id number] Date of Birth: [birth year]  Subjective/Objective:   Pt is a 63yo male admitted secondary to C3-C6 decompression fusion after progressive bilateral UE/LE weakness causing him to be nonambulatory for the past year.  PTA, pt required assistance with ADLS; lives at home with spouse and has HH aide/personal care attendant.               Action/Plan: PT/OT recommending CIR, and consult in progress.  Hopeful for rehab admission; pt progressing well with therapies.   Expected Discharge Date:                  Expected Discharge Plan:  IP Rehab Facility  In-House Referral:     Discharge planning Services  CM Consult  Post Acute Care Choice:    Choice offered to:     DME Arranged:    DME Agency:     HH Arranged:    HH Agency:     Status of Service:  In process, will continue to follow  If discussed at Long Length of Stay Meetings, dates discussed:    Additional Comments:  Quintella Baton, RN, BSN  Trauma/Neuro ICU Case Manager [phone]

## 2018-12-10 ENCOUNTER — Encounter (HOSPITAL_COMMUNITY): Payer: Self-pay | Admitting: *Deleted

## 2018-12-10 ENCOUNTER — Inpatient Hospital Stay (HOSPITAL_COMMUNITY)
Admission: RE | Admit: 2018-12-10 | Discharge: 2018-12-29 | DRG: 559 | Disposition: A | Discharge status: Home Health | Payer: No Typology Code available for payment source | Source: Intra-hospital | Attending: Physical Medicine & Rehabilitation | Admitting: Physical Medicine & Rehabilitation

## 2018-12-10 DIAGNOSIS — Z9049 Acquired absence of other specified parts of digestive tract: Secondary | ICD-10-CM

## 2018-12-10 DIAGNOSIS — G8250 Quadriplegia, unspecified: Secondary | ICD-10-CM | POA: Diagnosis not present

## 2018-12-10 DIAGNOSIS — R338 Other retention of urine: Secondary | ICD-10-CM | POA: Diagnosis present

## 2018-12-10 DIAGNOSIS — Z87891 Personal history of nicotine dependence: Secondary | ICD-10-CM

## 2018-12-10 DIAGNOSIS — M542 Cervicalgia: Secondary | ICD-10-CM | POA: Diagnosis present

## 2018-12-10 DIAGNOSIS — N401 Enlarged prostate with lower urinary tract symptoms: Secondary | ICD-10-CM | POA: Diagnosis present

## 2018-12-10 DIAGNOSIS — M25512 Pain in left shoulder: Secondary | ICD-10-CM | POA: Diagnosis present

## 2018-12-10 DIAGNOSIS — M25511 Pain in right shoulder: Secondary | ICD-10-CM | POA: Diagnosis present

## 2018-12-10 DIAGNOSIS — M75102 Unspecified rotator cuff tear or rupture of left shoulder, not specified as traumatic: Secondary | ICD-10-CM | POA: Diagnosis not present

## 2018-12-10 DIAGNOSIS — M62830 Muscle spasm of back: Secondary | ICD-10-CM | POA: Diagnosis not present

## 2018-12-10 DIAGNOSIS — F339 Major depressive disorder, recurrent, unspecified: Secondary | ICD-10-CM

## 2018-12-10 DIAGNOSIS — Z79899 Other long term (current) drug therapy: Secondary | ICD-10-CM

## 2018-12-10 DIAGNOSIS — Z4789 Encounter for other orthopedic aftercare: Principal | ICD-10-CM

## 2018-12-10 DIAGNOSIS — D649 Anemia, unspecified: Secondary | ICD-10-CM

## 2018-12-10 DIAGNOSIS — M5410 Radiculopathy, site unspecified: Secondary | ICD-10-CM | POA: Diagnosis present

## 2018-12-10 DIAGNOSIS — Z6841 Body Mass Index (BMI) 40.0 and over, adult: Secondary | ICD-10-CM

## 2018-12-10 DIAGNOSIS — M4712 Other spondylosis with myelopathy, cervical region: Secondary | ICD-10-CM | POA: Diagnosis not present

## 2018-12-10 DIAGNOSIS — A4151 Sepsis due to Escherichia coli [E. coli]: Secondary | ICD-10-CM | POA: Diagnosis not present

## 2018-12-10 DIAGNOSIS — I129 Hypertensive chronic kidney disease with stage 1 through stage 4 chronic kidney disease, or unspecified chronic kidney disease: Secondary | ICD-10-CM | POA: Diagnosis present

## 2018-12-10 DIAGNOSIS — D72819 Decreased white blood cell count, unspecified: Secondary | ICD-10-CM | POA: Diagnosis present

## 2018-12-10 DIAGNOSIS — M5416 Radiculopathy, lumbar region: Secondary | ICD-10-CM | POA: Diagnosis not present

## 2018-12-10 DIAGNOSIS — K219 Gastro-esophageal reflux disease without esophagitis: Secondary | ICD-10-CM

## 2018-12-10 DIAGNOSIS — R509 Fever, unspecified: Secondary | ICD-10-CM

## 2018-12-10 DIAGNOSIS — R339 Retention of urine, unspecified: Secondary | ICD-10-CM

## 2018-12-10 DIAGNOSIS — B9620 Unspecified Escherichia coli [E. coli] as the cause of diseases classified elsewhere: Secondary | ICD-10-CM | POA: Diagnosis present

## 2018-12-10 DIAGNOSIS — Z9889 Other specified postprocedural states: Secondary | ICD-10-CM | POA: Diagnosis not present

## 2018-12-10 DIAGNOSIS — G894 Chronic pain syndrome: Secondary | ICD-10-CM

## 2018-12-10 DIAGNOSIS — M21371 Foot drop, right foot: Secondary | ICD-10-CM | POA: Diagnosis not present

## 2018-12-10 DIAGNOSIS — D62 Acute posthemorrhagic anemia: Secondary | ICD-10-CM | POA: Diagnosis present

## 2018-12-10 DIAGNOSIS — M47812 Spondylosis without myelopathy or radiculopathy, cervical region: Secondary | ICD-10-CM | POA: Diagnosis not present

## 2018-12-10 DIAGNOSIS — N189 Chronic kidney disease, unspecified: Secondary | ICD-10-CM | POA: Diagnosis present

## 2018-12-10 DIAGNOSIS — R5381 Other malaise: Secondary | ICD-10-CM | POA: Diagnosis present

## 2018-12-10 DIAGNOSIS — D638 Anemia in other chronic diseases classified elsewhere: Secondary | ICD-10-CM | POA: Diagnosis present

## 2018-12-10 DIAGNOSIS — Z87442 Personal history of urinary calculi: Secondary | ICD-10-CM | POA: Diagnosis not present

## 2018-12-10 DIAGNOSIS — G4733 Obstructive sleep apnea (adult) (pediatric): Secondary | ICD-10-CM | POA: Diagnosis present

## 2018-12-10 DIAGNOSIS — A499 Bacterial infection, unspecified: Secondary | ICD-10-CM | POA: Diagnosis not present

## 2018-12-10 DIAGNOSIS — K592 Neurogenic bowel, not elsewhere classified: Secondary | ICD-10-CM | POA: Diagnosis present

## 2018-12-10 DIAGNOSIS — M810 Age-related osteoporosis without current pathological fracture: Secondary | ICD-10-CM | POA: Diagnosis present

## 2018-12-10 DIAGNOSIS — Z881 Allergy status to other antibiotic agents status: Secondary | ICD-10-CM

## 2018-12-10 DIAGNOSIS — N390 Urinary tract infection, site not specified: Secondary | ICD-10-CM | POA: Diagnosis not present

## 2018-12-10 DIAGNOSIS — Z981 Arthrodesis status: Secondary | ICD-10-CM

## 2018-12-10 DIAGNOSIS — Z79891 Long term (current) use of opiate analgesic: Secondary | ICD-10-CM

## 2018-12-10 DIAGNOSIS — N319 Neuromuscular dysfunction of bladder, unspecified: Secondary | ICD-10-CM | POA: Diagnosis not present

## 2018-12-10 DIAGNOSIS — R05 Cough: Secondary | ICD-10-CM | POA: Diagnosis not present

## 2018-12-10 DIAGNOSIS — E876 Hypokalemia: Secondary | ICD-10-CM | POA: Diagnosis not present

## 2018-12-10 DIAGNOSIS — G9589 Other specified diseases of spinal cord: Secondary | ICD-10-CM | POA: Diagnosis present

## 2018-12-10 DIAGNOSIS — Z9114 Patient's other noncompliance with medication regimen: Secondary | ICD-10-CM

## 2018-12-10 MED ORDER — PANTOPRAZOLE SODIUM 40 MG PO TBEC
40.0000 mg | DELAYED_RELEASE_TABLET | Freq: Two times a day (BID) | ORAL | Status: DC
  Administered 2018-12-10 – 2018-12-29 (×38): 40 mg via ORAL
  Filled 2018-12-10 (×38): qty 1

## 2018-12-10 MED ORDER — POLYETHYLENE GLYCOL 3350 17 G PO PACK: 17.0000 g | PACK | Freq: Every day | ORAL | Status: DC | PRN

## 2018-12-10 MED ORDER — FLEET ENEMA 7-19 GM/118ML RE ENEM: 1.0000 | ENEMA | Freq: Every day | RECTAL | Status: DC | PRN

## 2018-12-10 MED ORDER — BISACODYL 10 MG RE SUPP: 10.0000 mg | Freq: Every day | RECTAL | Status: DC | PRN

## 2018-12-10 MED ORDER — MAGIC MOUTHWASH W/LIDOCAINE: 10.0000 mL | Freq: Four times a day (QID) | ORAL | Status: DC | PRN

## 2018-12-10 MED ORDER — ALUM & MAG HYDROXIDE-SIMETH 200-200-20 MG/5ML PO SUSP
30.0000 mL | ORAL | Status: DC | PRN
  Administered 2018-12-10: 30 mL via ORAL
  Filled 2018-12-10: qty 30

## 2018-12-10 MED ORDER — SORBITOL 70 % SOLN: 30.0000 mL | Freq: Every day | Status: DC | PRN

## 2018-12-10 MED ORDER — DULOXETINE HCL 60 MG PO CPEP
60.0000 mg | ORAL_CAPSULE | Freq: Every day | ORAL | Status: DC
  Administered 2018-12-11 – 2018-12-29 (×19): 60 mg via ORAL
  Filled 2018-12-10 (×20): qty 1

## 2018-12-10 MED ORDER — HYDROCODONE-ACETAMINOPHEN 5-325 MG PO TABS
1.0000 | ORAL_TABLET | Freq: Four times a day (QID) | ORAL | Status: DC | PRN
  Administered 2018-12-11 – 2018-12-28 (×43): 1 via ORAL
  Filled 2018-12-10 (×44): qty 1

## 2018-12-10 MED ORDER — ACETAMINOPHEN 325 MG PO TABS
325.0000 mg | ORAL_TABLET | ORAL | Status: DC | PRN
  Administered 2018-12-14 – 2018-12-28 (×18): 650 mg via ORAL
  Filled 2018-12-10 (×18): qty 2

## 2018-12-10 MED ORDER — TAMSULOSIN HCL 0.4 MG PO CAPS
0.4000 mg | ORAL_CAPSULE | Freq: Every day | ORAL | Status: DC
  Administered 2018-12-10 – 2018-12-28 (×19): 0.4 mg via ORAL
  Filled 2018-12-10 (×19): qty 1

## 2018-12-10 MED ORDER — FERROUS GLUCONATE 324 (38 FE) MG PO TABS
324.0000 mg | ORAL_TABLET | Freq: Three times a day (TID) | ORAL | Status: DC
  Administered 2018-12-11 – 2018-12-29 (×55): 324 mg via ORAL
  Filled 2018-12-10 (×56): qty 1

## 2018-12-10 MED ORDER — CYCLOBENZAPRINE HCL 5 MG PO TABS
5.0000 mg | ORAL_TABLET | Freq: Three times a day (TID) | ORAL | Status: DC | PRN
  Administered 2018-12-12: 5 mg via ORAL
  Filled 2018-12-10: qty 1

## 2018-12-10 MED ORDER — TRAZODONE HCL 50 MG PO TABS
50.0000 mg | ORAL_TABLET | Freq: Every evening | ORAL | Status: DC | PRN
  Administered 2018-12-14 – 2018-12-24 (×2): 50 mg via ORAL
  Filled 2018-12-10 (×4): qty 1

## 2018-12-10 NOTE — Progress Notes (Addendum)
Report given, patient transferred to CIR with all belongings at bedside.

## 2018-12-10 NOTE — Progress Notes (Signed)
Inpatient Rehab Admissions  I have insurance authorization for inpatient rehab admission today.  I have contacted Dr. Dutch Quint and he is in agreement.  I have contacted the [facility] and CSW.  Will admit pt today.    Stephania Fragmin  Admissions Coordinator 12/10/18 3:24 PM
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
## 2018-12-10 NOTE — Progress Notes (Signed)
Inpatient Rehab Admissions  I met with patient at bedside.  He continues to be interested in an inpatient rehab stay following discharge from acute care.  I await insurance approval for possible admission today.  RN aware.    [name], PT, DPT Admissions Coordinator 12/10/18 12:22 PM
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
## 2018-12-10 NOTE — Progress Notes (Signed)
To  Rehab unit per bed accompanied by NT. Alert     Patient. Oriented to unit set up. Fall prevention plan discussed by Leota Sauers and signed by patient.

## 2018-12-10 NOTE — Progress Notes (Signed)
Postop day 3.  Overall doing well.  Pain well controlled.  Swallowing well.  Voice strong.  Cervical wound healing well.  Motor and sensory exam continue to slowly improve.  Still with some left deltoid weakness which is significant improved from preop.  Lower extremity strength also significantly better.  Able to stand longer yesterday with therapy.  Awaiting determination of inpatient rehab suitability.  Continue current care.

## 2018-12-11 ENCOUNTER — Other Ambulatory Visit: Payer: Self-pay

## 2018-12-11 ENCOUNTER — Inpatient Hospital Stay (HOSPITAL_COMMUNITY): Payer: No Typology Code available for payment source | Admitting: Occupational Therapy

## 2018-12-11 ENCOUNTER — Inpatient Hospital Stay (HOSPITAL_COMMUNITY): Payer: No Typology Code available for payment source | Admitting: Physical Therapy

## 2018-12-11 DIAGNOSIS — G8250 Quadriplegia, unspecified: Secondary | ICD-10-CM

## 2018-12-11 DIAGNOSIS — M47812 Spondylosis without myelopathy or radiculopathy, cervical region: Secondary | ICD-10-CM

## 2018-12-11 LAB — COMPREHENSIVE METABOLIC PANEL
ALT: 9 U/L (ref 0–44)
AST: 11 U/L — ABNORMAL LOW (ref 15–41)
Albumin: 3.1 g/dL — ABNORMAL LOW (ref 3.5–5.0)
Alkaline Phosphatase: 92 U/L (ref 38–126)
Anion gap: 9 (ref 5–15)
BUN: 14 mg/dL (ref 8–23)
CO2: 24 mmol/L (ref 22–32)
Calcium: 8.6 mg/dL — ABNORMAL LOW (ref 8.9–10.3)
Chloride: 107 mmol/L (ref 98–111)
Creatinine, Ser: 0.59 mg/dL — ABNORMAL LOW (ref 0.61–1.24)
GFR calc Af Amer: >60 mL/min (ref >60)
GFR calc non Af Amer: >60 mL/min (ref >60)
Glucose, Bld: 99 mg/dL (ref 70–99)
Potassium: 3 mmol/L — ABNORMAL LOW (ref 3.5–5.1)
Sodium: 140 mmol/L (ref 135–145)
Total Bilirubin: 0.5 mg/dL (ref 0.3–1.2)
Total Protein: 6.6 g/dL (ref 6.5–8.1)

## 2018-12-11 LAB — CBC WITH DIFFERENTIAL/PLATELET
Abs Immature Granulocytes: 0.02 10*3/uL (ref 0.00–0.07)
BASOS ABS: 0 10*3/uL (ref 0.0–0.1)
Basophils Relative: 0 %
Eosinophils Absolute: 0.2 10*3/uL (ref 0.0–0.5)
Eosinophils Relative: 3 %
HEMATOCRIT: 32.8 % — AB (ref 39.0–52.0)
Hemoglobin: 10.3 g/dL — ABNORMAL LOW (ref 13.0–17.0)
Immature Granulocytes: 0 %
Lymphocytes Relative: 16 %
Lymphs Abs: 1 10*3/uL (ref 0.7–4.0)
MCH: 24.7 pg — ABNORMAL LOW (ref 26.0–34.0)
MCHC: 31.4 g/dL (ref 30.0–36.0)
MCV: 78.7 fL — ABNORMAL LOW (ref 80.0–100.0)
Monocytes Absolute: 0.4 10*3/uL (ref 0.1–1.0)
Monocytes Relative: 7 %
Neutro Abs: 4.5 10*3/uL (ref 1.7–7.7)
Neutrophils Relative %: 74 %
Platelets: 241 10*3/uL (ref 150–400)
RBC: 4.17 MIL/uL — ABNORMAL LOW (ref 4.22–5.81)
RDW: 16.5 % — ABNORMAL HIGH (ref 11.5–15.5)
WBC: 6.1 10*3/uL (ref 4.0–10.5)
nRBC: 0 % (ref 0.0–0.2)

## 2018-12-11 MED ORDER — BISACODYL 10 MG RE SUPP
10.0000 mg | Freq: Every day | RECTAL | Status: DC
  Administered 2018-12-15 – 2018-12-24 (×4): 10 mg via RECTAL
  Filled 2018-12-11 (×8): qty 1

## 2018-12-11 NOTE — Progress Notes (Signed)
Occupational Therapy Session Note  Patient Details  Name: [name] [name] MRN: [id number] Date of Birth: 05/01/1956  Today's Date: 12/11/2018 OT Individual Time: 1300-1330 OT Individual Time Calculation (min): 30 min    Short Term Goals: Week 1:  OT Short Term Goal 1 (Week 1): Pt will complete 1/3 components of donning pants using AE as needed  OT Short Term Goal 2 (Week 1): Pt will complete bathing sit<stand with 1 assist  OT Short Term Goal 3 (Week 1): Pt will complete 1 grooming task while semi perched in [place] to work on standing tolerance   Skilled Therapeutic Interventions/Progress Updates:  1:1 Pt received in the w/c and transported down to the gym. Focus on functional transfers and sit to stands. Slide board transfer to the mat with min A for the scoot but required a 2nd person to place the board so this Ot could help with weight shift and lifting LE. On the mat focused on sit to stands 3 times with STEADY for grab bar for sit to stands; requiring mod A.  Attempted to transition from UE being underneath his trunk to a more upright position. Able to do this only 2x tired by the 3rd one. +2 again for slide board placement and min A transfer back into w/c,    Balance/vestibular training;Community reintegration;Disease mangement/prevention;Neuromuscular re-education;Patient/family education;Self Care/advanced ADL retraining;Splinting/orthotics;Therapeutic Exercise;UE/LE Coordination activities;Wheelchair propulsion/positioning;UE/LE Strength taining/ROM;Therapeutic Activities;Skin care/wound managment;Psychosocial support;Pain management;Functional mobility training;DME/adaptive equipment instruction;Discharge planning   Therapy Documentation Precautions:  Precautions Precautions: Cervical, Fall Precaution Comments: reviewed cervical precautions with patient, min cueing to recall   Required Braces or Orthoses: Cervical Brace Cervical Brace: Soft collar Restrictions Weight Bearing  Restrictions: No General: General Chart Reviewed: Yes Family/Caregiver Present: Azucena Freed) Vital Signs:  Pain: Pain Assessment Pain Scale: 0-10 Pain Score: 5  Pain Location: Neck Pain Orientation: Posterior Pain Descriptors / Indicators: Aching Pain Frequency: Intermittent Pain Onset: On-going Pain Intervention(s): Medication (See eMAR)   Therapy/Group: Individual Therapy  Roney Mans [facility] 12/11/2018, 3:35 PM

## 2018-12-11 NOTE — Progress Notes (Signed)
Patient information reviewed and entered into eRehab System by Becky Cayle Cordoba, PPS coordinator. Information including medical coding, function ability, and quality indicators will be reviewed and updated through discharge.   

## 2018-12-11 NOTE — Evaluation (Signed)
Physical Therapy Assessment and Plan  Patient Details  Name: [name] [name] MRN: [id number] Date of Birth: [birth year]  PT Diagnosis: Impaired sensation, Muscle weakness and Paraplegia Rehab Potential: Good ELOS: 16-18 days   Today's Date: 12/11/2018 PT Individual Time: 0800-0915 PT Individual Time Calculation (min): 75 min    Problem List:  Patient Active Problem List   Diagnosis Date Noted  . Radiculopathy 12/10/2018  . Cervical myelopathy ([place]) 12/07/2018  . Microcytic hypochromic anemia 08/27/2018  . At risk for adverse drug event 08/10/2018  . Chronic pain syndrome   . Labile blood pressure   . Urinary retention   . Hypokalemia   . Hypertension   . Acute blood loss anemia   . Anemia of chronic disease   . Abscess in epidural space of lumbar spine   . Effusion, left knee   . Pain and swelling of wrist, left   . Complete tear of left rotator cuff 07/03/2018    Class: Chronic  . MSSA bacteremia   . Septic arthritis of knee, left ([place]) 06/30/2018    Class: Acute  . Leukocytosis   . Postoperative seroma involving nervous system after nervous system procedure   . Shoulder pain, bilateral   . Thoracic myelopathy 06/28/2018  . Spondylosis, thoracic, with myelopathy 06/28/2018    Class: Acute  . Abscess in epidural space of L2-L5 lumbar spine 06/28/2018    Class: Acute  . Acute urinary retention 06/28/2018  . Left cervical radiculopathy 06/28/2018    Class: Acute  . Spinal stenosis of lumbar region 11/17/2017    Class: Chronic  . Status post lumbar laminectomy 11/17/2017  . Lumbar stenosis with neurogenic claudication 08/18/2017  . Forestier's disease of thoracolumbar region 08/18/2017  . Degenerative disc disease, lumbar 08/18/2017  . Lumbar radiculopathy 09/30/2016  . Morbid (severe) obesity due to excess calories ([place]) 09/30/2016  . Biliary calculus with acute cholecystitis 03/21/2016  . Acute pyelonephritis 12/28/2013  . Ureteral stone with hydronephrosis  12/28/2013  . Acute kidney failure ([place]) 12/27/2013  . Severe sepsis with acute organ dysfunction ([place]) 12/27/2013  . Hypotension 12/27/2013  . Lactic acidosis 12/27/2013  . Sepsis secondary to UTI ([place]) 12/27/2013  . EMPYEMA CHEST 08/30/2008  . PRURIGO 08/30/2008  . OBSTRUCTIVE SLEEP APNEA 08/30/2008    Past Medical History:  Past Medical History:  Diagnosis Date  . Anemia   . Arthritis   . Chronic kidney disease    HX acute kidney failure / acute pyelonephritis / hydronephrosis / severe sepsis per discharge summary 12/27/13  . GERD (gastroesophageal reflux disease)   . History of kidney stones   . Hypertension   . Morbid obesity ([place])   . Obstructive sleep apnea    does not need c pap since 110 lb wt loss  . Osteoporosis   . Prurigo 2002  . Scars    ON ARMS FROM CHEMICAL EXPLOSION 1999  . Spinal stenosis    Past Surgical History:  Past Surgical History:  Procedure Laterality Date  . ANTERIOR CERVICAL DECOMP/DISCECTOMY FUSION N/A 12/07/2018   Procedure: ANTERIOR CERVICAL DECOMPRESSION FUSION - CERVICAL THREE-CERVICAL FOUR, CERVICAL FOUR-CERVICAL FIVE, CERVICAL FIVE-CERVICAL SIX;  Surgeon: Earnie Larsson, MD;  Location: [facility];  Service: Neurosurgery;  Laterality: N/A;  . BACK SURGERY  01/23/2010   lumbar  . CHOLECYSTECTOMY N/A 03/24/2016   Procedure: LAPAROSCOPIC CHOLECYSTECTOMY WITH INTRAOPERATIVE CHOLANGIOGRAM;  Surgeon: Autumn Messing III, MD;  Location: [facility];  Service: General;  Laterality: N/A;  . CIRCUMCISION    . CYSTOSCOPY WITH RETROGRADE  PYELOGRAM, URETEROSCOPY AND STENT PLACEMENT Left 01/20/2014   Procedure: CYSTOSCOPY WITH RETROGRADE PYELOGRAM, URETEROSCOPY AND STENT EXCHANGE;  Surgeon: Molli Hazard, MD;  Location: [facility];  Service: Urology;  Laterality: Left;  bugbee bladder fulguration  . CYSTOSCOPY WITH STENT PLACEMENT Left 12/28/2013   Procedure: CYSTOSCOPY WITH STENT PLACEMENT left retrograde pyleogram;  Surgeon: Molli Hazard, MD;  Location: [facility];   Service: Urology;  Laterality: Left;  . ESOPHAGOGASTRODUODENOSCOPY N/A 12/07/2012   Procedure: ESOPHAGOGASTRODUODENOSCOPY (EGD);  Surgeon: Madilyn Hook, DO;  Location: [facility];  Service: General;  Laterality: N/A;  . HOLMIUM LASER APPLICATION Left 10/19/1094   Procedure: HOLMIUM LASER APPLICATION;  Surgeon: Molli Hazard, MD;  Location: [facility];  Service: Urology;  Laterality: Left;  . KNEE ARTHROSCOPY Left 06/29/2018   Procedure: ARTHROSCOPY KNEE;  Surgeon: Jessy Oto, MD;  Location: [facility];  Service: Orthopedics;  Laterality: Left;  . LAPAROSCOPIC GASTRIC SLEEVE RESECTION N/A 12/07/2012   Procedure: LAPAROSCOPIC GASTRIC SLEEVE RESECTION;  Surgeon: Madilyn Hook, DO;  Location: [facility];  Service: General;  Laterality: N/A;  laparoscopic sleeve gastrectomy with EGD  . LUMBAR LAMINECTOMY N/A 11/17/2017   Procedure: L2-3 LAMINECTOMY AND REDO LAMINECTOMIES  L3-4, L4-5 AND L5-S1;  Surgeon: Jessy Oto, MD;  Location: [facility];  Service: Orthopedics;  Laterality: N/A;  . LUMBAR WOUND DEBRIDEMENT N/A 06/29/2018   Procedure: LUMBAR WOUND DEBRIDEMENT DRAINAGE AND IRRIGATION; AND ASPIRATION OF LEFT KNEE;  Surgeon: Jessy Oto, MD;  Location: [facility];  Service: Orthopedics;  Laterality: N/A;  . TEE WITHOUT CARDIOVERSION N/A 07/01/2018   Procedure: TRANSESOPHAGEAL ECHOCARDIOGRAM (TEE);  Surgeon: Larey Dresser, MD;  [facility];  Service: Cardiovascular;  Laterality: N/A;    Assessment & Plan Clinical Impression:  [name] [name] is a 63 year old male old male with history of CKD, HTN, morbid obesity- BMI- 72, BPH-h/o retention, lumbar decompression 0/4540 complicated by post op infection and septic knee with CIR stay 07/2018, neck pain with progressive BUE/BLE weakness with inability to walk due to severe cervical stenosis C3-C6 with radiculopathy. Back wound healed with infection resolved and patient admitted on 12/07/18 for ACDF C3-C6 by Dr. Annette Stable. Patient with significant debility with myelopathy and  has had HHPT/HHOT ongoing and attempting SB transfers. He continues to have deficits in mobility and self care tasks. CIR recommended for follow up therapy.Patient transferred to CIR on 12/10/2018 .   Patient currently requires max with mobility secondary to muscle weakness and decreased sitting balance, decreased standing balance and decreased balance strategies.  Prior to hospitalization, patient was min with some mobility and dependent via use of hoyer for some transfers and lived with Spouse in a House home.  Home access is  Level entry.  Patient will benefit from skilled PT intervention to maximize safe functional mobility, minimize fall risk and decrease caregiver burden for planned discharge home with 24 hour assist.  Anticipate patient will benefit from follow up [facility] at discharge.  PT - End of Session Activity Tolerance: Tolerates 30+ min activity with multiple rests Endurance Deficit: Yes Endurance Deficit Description: frequent rest breaks PT Assessment Rehab Potential (ACUTE/IP ONLY): Good PT Barriers to Discharge: Medical stability PT Patient demonstrates impairments in the following area(s): Balance;Endurance;Motor;Pain;Safety;Sensory PT Transfers Functional Problem(s): Bed Mobility;Bed to Chair;Car;Furniture;Floor PT Locomotion Functional Problem(s): Ambulation;Wheelchair Mobility;Stairs PT Plan PT Intensity: Minimum of 1-2 x/day ,45 to 90 minutes PT Frequency: 5 out of 7 days PT Duration Estimated Length of Stay: 16-18 days PT Treatment/Interventions: Balance/vestibular training;Community reintegration;Discharge planning;Disease management/prevention;DME/adaptive equipment instruction;Functional  electrical stimulation;Functional mobility training;Neuromuscular re-education;Pain management;Patient/family education;Psychosocial support;Splinting/orthotics;Therapeutic Activities;Therapeutic Exercise;UE/LE Strength taining/ROM;UE/LE Coordination activities;Wheelchair  propulsion/positioning PT Transfers Anticipated Outcome(s): min A PT Locomotion Anticipated Outcome(s): min A and w/c level PT Recommendation Recommendations for Other Services: Neuropsych consult;Therapeutic Recreation consult Therapeutic Recreation Interventions: Stress management Follow Up Recommendations: Home health PT Patient destination: Home Equipment Recommended: Other (comment)(pt already owns all necessary equipment) Equipment Details: pt already owns all necessary equipment  Skilled Therapeutic Intervention Evaluation completed (see details above and below) with education on PT POC and goals and individual treatment initiated with focus on bed mobility and functional transfer assessment. Pt received seated in bed, agreeable to PT eval. Pt reports 5/10 pain in neck, just received pain medication and declines any further intervention. Rolling L/R with max A and use of bedrails for dependent pericare and donning of brief due to bowel incontinence. Supine to sit with mod A with HOB elevated. Sit to stand with max A to stedy. Pt unable to reach full standing and remains with B knees and trunk flexed in standing. Stedy transfer bed to w/c. Pt left seated in w/c in room with needs in reach, quick release belt and chair alarm in place.  PT Evaluation Precautions/Restrictions Precautions Precautions: Cervical;Fall Precaution Comments: reviewed cervical precautions with patient, min cueing to recall   Required Braces or Orthoses: Cervical Brace Cervical Brace: Soft collar Restrictions Weight Bearing Restrictions: No General   Vital Signs Pain Pain Assessment Pain Scale: 0-10 Pain Score: 5  Pain Location: Neck Pain Orientation: Posterior Pain Descriptors / Indicators: Aching Pain Frequency: Intermittent Pain Onset: On-going Pain Intervention(s): Medication (See eMAR) Home Living/Prior Functioning Home Living Available Help at Discharge: Family;Available 24 hours/day;Personal  care attendant Type of Home: House Home Access: Level entry Home Layout: One level Additional Comments: wife was assisting 24/7  Lives With: Spouse Prior Function Level of Independence: Needs assistance with tranfers  Able to Take Stairs?: No Driving: No Comments: Most recently prior to admission, pt was using slide board for transfers in/out of w/c.  He was receiving home health services and was beginning to stand and take steps with therapy. Vision/Perception  Perception Perception: Within Functional Limits Praxis Praxis: Intact  Cognition Overall Cognitive Status: Within Functional Limits for tasks assessed Arousal/Alertness: Awake/alert Orientation Level: Oriented X4 Attention: Sustained Sustained Attention: Appears intact Memory: Appears intact Awareness: Appears intact Problem Solving: Appears intact Safety/Judgment: Appears intact Sensation Sensation Light Touch: Impaired Detail Light Touch Impaired Details: Impaired RLE;Impaired LLE(distal > proximal) Proprioception: Impaired Detail Proprioception Impaired Details: Impaired RLE;Impaired LLE(distal > proximal) Coordination Gross Motor Movements are Fluid and Coordinated: No Fine Motor Movements are Fluid and Coordinated: No Coordination and Movement Description: impaired by generalized weakness Heel Shin Test: unable to assess 2/2 weakness Motor  Motor Motor: Paraplegia Motor - Skilled Clinical Observations: BLE weakness 2/2 radiculopathy, R>L  Mobility Bed Mobility Bed Mobility: Rolling Right;Rolling Left;Supine to Sit;Sit to Supine Rolling Right: Maximal Assistance - Patient 25-49% Rolling Left: Maximal Assistance - Patient 25-49% Supine to Sit: Maximal Assistance - Patient - Patient 25-49% Sit to Supine: Maximal Assistance - Patient 25-49% Transfers Transfers: Sit to [employer] via Geophysicist/field seismologist Sit to Stand: Maximal Assistance - Patient 25-49% Transfer (Assistive device): Other (Comment)(from bed to  stedy) Transfer via Lift Equipment: Haematologist / Additional Locomotion Stairs: No Architect: No  Trunk/Postural Assessment  Cervical Assessment Cervical Assessment: Exceptions to [facility] restrictions 2/2 ACDF) Thoracic Assessment Thoracic Assessment: Exceptions to WFL(rounded shoulders) Lumbar Assessment Lumbar Assessment: Exceptions to WFL(posterior  pelvic tilt) Postural Control Postural Control: Within Functional Limits  Balance Balance Balance Assessed: Yes Static Sitting Balance Static Sitting - Balance Support: Feet supported;No upper extremity supported Static Sitting - Level of Assistance: 5: Stand by assistance Dynamic Sitting Balance Dynamic Sitting - Balance Support: Feet supported;During functional activity Dynamic Sitting - Level of Assistance: 4: Min assist Static Standing Balance Static Standing - Balance Support: Bilateral upper extremity supported;During functional activity Static Standing - Level of Assistance: 4: Min assist(in stedy) Extremity Assessment   RLE Assessment RLE Assessment: Exceptions to [facility] General Strength Comments: impaired, see below RLE Strength Right Hip Flexion: 3+/5 Right Knee Flexion: 3/5 Right Knee Extension: 3/5 Right Ankle Dorsiflexion: 0/5 LLE Assessment LLE Assessment: Exceptions to [facility] Passive Range of Motion (PROM) Comments: limited knee ROM General Strength Comments: impaired, see below LLE Strength Left Hip Flexion: 5/5 Left Knee Flexion: 3+/5 Left Knee Extension: 3+/5 Left Ankle Dorsiflexion: 3/5    Refer to Care Plan for Long Term Goals  Recommendations for other services: Neuropsych and Therapeutic Recreation  Stress management  Discharge Criteria: Patient will be discharged from PT if patient refuses treatment 3 consecutive times without medical reason, if treatment goals not met, if there is a change in medical status, if patient makes no progress towards goals or if  patient is discharged from hospital.  The above assessment, treatment plan, treatment alternatives and goals were discussed and mutually agreed upon: by patient   Excell Seltzer, PT, DPT  12/11/2018, 12:15 PM

## 2018-12-11 NOTE — H&P (Signed)
Physical Medicine and Rehabilitation Admission H&P     CC: Functional deficits due to myelopathy     HPI: [name] [name] is a 63 year old male with history of CKD, HTN, morbid obesity- BMI- 43, BPH-h/o retention,  lumbar decompression 11/2017 complicated by post op infection and septic knee with CIR stay 07/2018, neck pain with progressive BUE/BLE weakness with inability to walk due to severe cervical stenosis C3-C6 with radiculopathy. Back wound healed with infection resolved and patient admitted on 12/07/18 for ACDF C3-C6 by Dr. Jordan Likes.  Patient with significant debility with myelopathy and has had HHPT/HHOT ongoing and attempting SB transfers. He continues to have deficits in mobility and self care tasks. CIR recommended for follow up therapy.      Review of Systems  Constitutional: Negative for chills and fever.  HENT: Negative for hearing loss and tinnitus.   Eyes: Negative for blurred vision and double vision.  Respiratory: Negative for cough and shortness of breath.   Cardiovascular: Negative for chest pain and palpitations.  Gastrointestinal: Positive for heartburn. Negative for constipation and nausea.       Incontinent of bowel since last surgery  Genitourinary:       Foley in place  Musculoskeletal: Positive for joint pain (chronic right > left shoulder pain). Negative for myalgias.  Skin: Negative for rash.  Neurological: Positive for focal weakness and weakness. Negative for dizziness and headaches.  Psychiatric/Behavioral: Negative for memory loss. The patient is not nervous/anxious.           Past Medical History:  Diagnosis Date  . Anemia    . Arthritis    . Chronic kidney disease      HX acute kidney failure / acute pyelonephritis / hydronephrosis / severe sepsis per discharge summary 12/27/13  . GERD (gastroesophageal reflux disease)    . History of kidney stones    . Hypertension    . Morbid obesity (HCC)    . Obstructive sleep apnea      does not need c pap since  110 lb wt loss  . Osteoporosis    . Prurigo 2002  . Scars      ON ARMS FROM CHEMICAL EXPLOSION 1999  . Spinal stenosis             Past Surgical History:  Procedure Laterality Date  . ANTERIOR CERVICAL DECOMP/DISCECTOMY FUSION N/A 12/07/2018    Procedure: ANTERIOR CERVICAL DECOMPRESSION FUSION - CERVICAL THREE-CERVICAL FOUR, CERVICAL FOUR-CERVICAL FIVE, CERVICAL FIVE-CERVICAL SIX;  Surgeon: Julio Sicks, MD;  Location: [facility] OR;  Service: Neurosurgery;  Laterality: N/A;  . BACK SURGERY   01/23/2010    lumbar  . CHOLECYSTECTOMY N/A 03/24/2016    Procedure: LAPAROSCOPIC CHOLECYSTECTOMY WITH INTRAOPERATIVE CHOLANGIOGRAM;  Surgeon: Chevis Pretty III, MD;  Location: [facility];  Service: General;  Laterality: N/A;  . CIRCUMCISION      . CYSTOSCOPY WITH RETROGRADE PYELOGRAM, URETEROSCOPY AND STENT PLACEMENT Left 01/20/2014    Procedure: CYSTOSCOPY WITH RETROGRADE PYELOGRAM, URETEROSCOPY AND STENT EXCHANGE;  Surgeon: Milford Cage, MD;  Location: [facility];  Service: Urology;  Laterality: Left;  bugbee bladder fulguration  . CYSTOSCOPY WITH STENT PLACEMENT Left 12/28/2013    Procedure: CYSTOSCOPY WITH STENT PLACEMENT left retrograde pyleogram;  Surgeon: Milford Cage, MD;  Location: [facility];  Service: Urology;  Laterality: Left;  . ESOPHAGOGASTRODUODENOSCOPY N/A 12/07/2012    Procedure: ESOPHAGOGASTRODUODENOSCOPY (EGD);  Surgeon: Lodema Pilot, DO;  Location: [facility];  Service: General;  Laterality: N/A;  . HOLMIUM LASER APPLICATION Left 01/20/2014  Procedure: HOLMIUM LASER APPLICATION;  Surgeon: Milford Cage, MD;  Location: [facility];  Service: Urology;  Laterality: Left;  . KNEE ARTHROSCOPY Left 06/29/2018    Procedure: ARTHROSCOPY KNEE;  Surgeon: Kerrin Champagne, MD;  Location: [facility] OR;  Service: Orthopedics;  Laterality: Left;  . LAPAROSCOPIC GASTRIC SLEEVE RESECTION N/A 12/07/2012    Procedure: LAPAROSCOPIC GASTRIC SLEEVE RESECTION;  Surgeon: Lodema Pilot, DO;  Location: [facility];  Service: General;   Laterality: N/A;  laparoscopic sleeve gastrectomy with EGD  . LUMBAR LAMINECTOMY N/A 11/17/2017    Procedure: L2-3 LAMINECTOMY AND REDO LAMINECTOMIES  L3-4, L4-5 AND L5-S1;  Surgeon: Kerrin Champagne, MD;  Location: [facility] OR;  Service: Orthopedics;  Laterality: N/A;  . LUMBAR WOUND DEBRIDEMENT N/A 06/29/2018    Procedure: LUMBAR WOUND DEBRIDEMENT DRAINAGE AND IRRIGATION; AND ASPIRATION OF LEFT KNEE;  Surgeon: Kerrin Champagne, MD;  Location: [facility] OR;  Service: Orthopedics;  Laterality: N/A;  . TEE WITHOUT CARDIOVERSION N/A 07/01/2018    Procedure: TRANSESOPHAGEAL ECHOCARDIOGRAM (TEE);  Surgeon: Laurey Morale, MD;  Location: [facility] ENDOSCOPY;  Service: Cardiovascular;  Laterality: N/A;      History reviewed. No pertinent family history.      Social History:  Married. Has been requiring to CIR --> SNF-->home and working on SB transfers per reports. Needed assistance with ADLs and hoyer transfers. He   reports that he quit smoking about 44 years ago. His smoking use included cigarettes. He has never used smokeless tobacco. He reports that he does not drink alcohol or use drugs.          Allergies  Allergen Reactions  . Unasyn [Ampicillin-Sulbactam Sodium] Hives, Itching, Swelling and Rash          Medications Prior to Admission  Medication Sig Dispense Refill  . celecoxib (CELEBREX) 200 MG capsule Take 1 capsule (200 mg total) by mouth daily. 30 capsule 3  . diclofenac sodium (VOLTAREN) 1 % GEL Apply 4 g topically 4 (four) times daily. (Patient taking differently: Apply 4 g topically 2 (two) times daily. ) 5 Tube 3  . DULoxetine (CYMBALTA) 60 MG capsule Take 1 capsule (60 mg total) by mouth daily. 30 capsule 0  . ferrous gluconate (FERGON) 324 MG tablet Take 1 tablet (324 mg total) by mouth 3 (three) times daily with meals. 90 tablet 0  . HYDROcodone-acetaminophen (NORCO) 7.5-325 MG tablet Take 1 tablet by mouth every 8 (eight) hours as needed for moderate pain. 21 tablet 0  . pantoprazole (PROTONIX) 40  MG tablet Take 1 tablet (40 mg total) by mouth 2 (two) times daily before a meal. (Patient taking differently: Take 40 mg by mouth daily. ) 60 tablet 0  . tamsulosin (FLOMAX) 0.4 MG CAPS capsule Take 1 capsule (0.4 mg total) by mouth daily. 30 capsule 0      Drug Regimen Review  Drug regimen was reviewed and remains appropriate with no significant issues identified   Home: Home Living Family/patient expects to be discharged to:: Private residence Living Arrangements: Spouse/significant other Available Help at Discharge: Family, Available 24 hours/day, Personal care attendant Type of Home: House Home Access: Level entry Home Layout: One level Bathroom Shower/Tub: Health visitor: Handicapped height Home Equipment: Environmental consultant - 2 wheels, Bedside commode, Wheelchair - manual, Shower seat, Grab bars - tub/shower, Hand held shower head, Hospital bed, Other (comment), Adaptive equipment Adaptive Equipment: Reacher, Sock aid, Long-handled sponge Additional Comments: wife assist 24/7 (no aide)    Functional History: Prior Function Level of Independence:  Needs assistance Gait / Transfers Assistance Needed: able to roll and get to EOB using sliding board for transfer to Wc or hoyer lift ADL's / Homemaking Assistance Needed: wears adult briefs, total assist for pericare, assist for LB bathing, dressing but able to complete UB; setup grooming and eating    Functional Status:  Mobility: Bed Mobility Overal bed mobility: Needs Assistance Bed Mobility: Rolling, Sidelying to Sit Rolling: Min assist Sidelying to sit: Mod assist General bed mobility comments: Patient requires increased time and effort for mobiity, with reliance on rails and support to ascend trunk into sitting  Transfers Overall transfer level: Needs assistance Transfers: Sit to/from Stand, Stand Pivot Transfers Sit to Stand: Min assist, +2 physical assistance Stand pivot transfers: Min assist, +2 physical  assistance General transfer comment: deferred due to only 1+ assist and pt just returning to bed  Ambulation/Gait General Gait Details: unable   ADL: ADL Overall ADL's : Needs assistance/impaired Eating/Feeding: Minimal assistance, Sitting Grooming: Minimal assistance, Sitting Upper Body Bathing: Sitting, Moderate assistance Lower Body Bathing: Maximal assistance, Sitting/lateral leans Upper Body Dressing : Moderate assistance, Sitting Lower Body Dressing: Total assistance, Sitting/lateral leans Toilet Transfer Details (indicate cue type and reason): deferred, 1+ assist only and patient just returned to bed Functional mobility during ADLs: Moderate assistance(limited to bed mobility ) General ADL Comments: pt limited by decreased strength, body habitus, and impaired balance    Cognition: Cognition Overall Cognitive Status: Within Functional Limits for tasks assessed Orientation Level: Oriented X4 Cognition Arousal/Alertness: Awake/alert Behavior During Therapy: WFL for tasks assessed/performed Overall Cognitive Status: Within Functional Limits for tasks assessed     Blood pressure 113/77, pulse 65, temperature 97.8 F (36.6 C), temperature source Oral, resp. rate 16, height  (1.778 m), weight (!) 138.3 kg, SpO2 99 %. Physical Exam  Constitutional:  Morbidly obese male.   Genitourinary:    Genitourinary Comments: Foley in place.      General: No acute distress Mood and affect are appropriate Heart: Regular rate and rhythm no rubs murmurs or extra sounds Lungs: Clear to auscultation, breathing unlabored, no rales or wheezes Abdomen: Positive bowel sounds, soft nontender to palpation, nondistended Extremities: No clubbing, cyanosis, or edema Skin: No evidence of breakdown, no evidence of rash Neurologic motor strength is 4-/5 in bilateral deltoid, bicep, tricep, grip,3- hip flexor, knee extensors, ankle dorsiflexor and plantar flexor Sensory exam normal sensation to  light touch  in bilateral upper and lower extremities, reduced below the ankle bilaterally Cerebellar exam normal finger to nose to finger as well as heel to shin in bilateral upper and lower extremities Musculoskeletal:mildly diminished shoulder ROM. No joint swelling   Lab Results Last 48 Hours  No results found for this or any previous visit (from the past 48 hour(s)).    Imaging Results (Last 48 hours)  Dg Cervical Spine 2-3 Views   Result Date: 12/07/2018 CLINICAL DATA:  Surgical anterior fusion from C3-C6. EXAM: DG C-ARM 61-120 MIN; CERVICAL SPINE - 2-3 VIEW FLUOROSCOPY TIME:  6 seconds. COMPARISON:  Radiographs of June 28, 2018. FINDINGS: Two intraoperative fluoroscopic images demonstrate the patient be status post surgical anterior fusion of C3-4, C4-5 and C5-6. Good alignment of the vertebral bodies is noted. IMPRESSION: Status post surgical fusion at multiple levels as described above. Electronically Signed   By: Lupita Raider, M.D.   On: 12/07/2018 15:40    Dg C-arm 1-60 Min   Result Date: 12/07/2018 CLINICAL DATA:  Surgical anterior fusion from C3-C6. EXAM: DG C-ARM 61-120  MIN; CERVICAL SPINE - 2-3 VIEW FLUOROSCOPY TIME:  6 seconds. COMPARISON:  Radiographs of June 28, 2018. FINDINGS: Two intraoperative fluoroscopic images demonstrate the patient be status post surgical anterior fusion of C3-4, C4-5 and C5-6. Good alignment of the vertebral bodies is noted. IMPRESSION: Status post surgical fusion at multiple levels as described above. Electronically Signed   By: Lupita RaiderJames  Green Jr, M.D.   On: 12/07/2018 15:40             Medical Problem List and Plan: 1.  tetraparesis secondary to cervical myelopathy s/c C3-6 ACDF 2.  Antithrombotics: -DVT/anticoagulation:  Mechanical:  Antiembolism stockings, knee (TED hose) Bilateral lower extremities Sequential compression devices, below knee Bilateral lower extremities             -antiplatelet therapy: N/A 3. Pain Management: On  Cymbalta daily. Continue hydrocodone prn 4. Mood: LCSW to follow for evaluation and support.              -antipsychotic agents: N/A 5. Neuropsych: This patient is capable of making decisions on his own behalf. 6. Skin/Wound Care: Routine pressure relief measures.  7. Fluids/Electrolytes/Nutrition: Monitor I/O. Check lytes in am. 8. Urinary retention/BPH: Question plans for TURP. Continue Flomax. 9.  Anemia of chronic disease: On iron for supplement. 10. Leucopenia: Recheck WBC in am.  11. Morbid obesity: Encourage appropriate diet and weight loss to help promote mobility and health.     Post Admission Physician Evaluation: 1. Functional deficits secondary  to tetraparesis, cervical myelopathy. 2. Patient admitted to receive collaborative, interdisciplinary care between the physiatrist, rehab nursing staff, and therapy team. 3. Patient's level of medical complexity and substantial therapy needs in context of that medical necessity cannot be provided at a lesser intensity of care. 4. Patient has experienced substantial functional loss from his/her baseline. Judging by the patient's diagnosis, physical exam, and functional history, the patient has potential for functional progress which will result in measurable gains while on inpatient rehab.  These gains will be of substantial and practical use upon discharge in facilitating mobility and self-care at the household level. 5. Physiatrist will provide 24 hour management of medical needs as well as oversight of the therapy plan/treatment and provide guidance as appropriate regarding the interaction of the two. 6. 24 hour rehab nursing will assist in the management of  bladder management, bowel management, safety, skin/wound care, disease management, medication administration, pain management and patient education  and help integrate therapy concepts, techniques,education, etc. 7. PT will assess and treat for:pre gait, gait training, endurance , safety,  equipment, neuromuscular re education  .  Goals are: mod A WC level. 8. OT will assess and treat for ADLs, Cognitive perceptual skills, Neuromuscular re education, safety, endurance, equipment  .  Goals are: min A UE ADL, Mod A LE ADL.  9. SLP will assess and treat for NA  .  Goals are: N/A. 10. Case Management and Social Worker will assess and treat for psychological issues and discharge planning. 11. Team conference will be held weekly to assess progress toward goals and to determine barriers to discharge. 12.  Patient will receive at least 3 hours of therapy per day at least 5 days per week. 13. ELOS and Prognosis: 15-20d good    "I have personally performed a face to face diagnostic evaluation of this patient.  Additionally, I have reviewed and concur with the physician assistant's documentation above."  Erick ColaceAndrew E. Shayley Medlin M.D. [facility] FAAPM&R (Sports Med, Neuromuscular Med) Diplomate Am Board  of Electrodiagnostic Med  Jacquelynn Cree, PA-C 12/09/2018

## 2018-12-11 NOTE — Progress Notes (Signed)
Occupational Therapy Session Note  Patient Details  Name: [name] [name] MRN: [id number] Date of Birth: 11-03-1955  Today's Date: 12/11/2018 OT Individual Time: 1400-1443 OT Individual Time Calculation (min): 43 min    Short Term Goals: Week 1:  OT Short Term Goal 1 (Week 1): Pt will complete 1/3 components of donning pants using AE as needed  OT Short Term Goal 2 (Week 1): Pt will complete bathing sit<stand with 1 assist  OT Short Term Goal 3 (Week 1): Pt will complete 1 grooming task while semi perched in [place] to work on standing tolerance   Skilled Therapeutic Interventions/Progress Updates:  Treatment session with focus on functional transfers and sit > stand.  Pt received upright in w/c agreeable to therapy session.  Engaged in toilet transfer w/c > BSC with pt unable to complete fully due to fatigue.  Therapist placed slide board in preparation for transfer and pt completing scoots advancing along board, however due to slight incline pt fatiguing and sliding back down board to w/c despite mod-max assist to advance along board.  Attempted a second time with pt making less progress along board.  Pt reports fatigue from previous therapy sessions.  Engaged in sit > stand in [place], ultimately requiring +2 assist to come upright due to fatigue.  Engaged in partial sit > stand x3 from elevated [place] seat with mod assist.  Returned to bed via [place] with +2 for safety and engaged in rolling at bed level to clean post BM.  Mod assist for rolling and +2 for hygiene and clothing management at bed level.  Pt left semi-reclined in bed with all needs in reach.  Therapy Documentation Precautions:  Precautions Precautions: Cervical, Fall Precaution Comments: reviewed cervical precautions with patient, min cueing to recall   Required Braces or Orthoses: Cervical Brace Cervical Brace: Soft collar Restrictions Weight Bearing Restrictions: No General: General Chart Reviewed: Yes Family/Caregiver  Present: Yes(spouse [name]) Pain: Pain Assessment Pain Score: 2    Therapy/Group: Individual Therapy  Rosalio Loud 12/11/2018, 4:01 PM

## 2018-12-11 NOTE — Progress Notes (Signed)
Social Work  Social Work Assessment and Plan  Patient Details  Name: [name] [name] MRN: [id number] Date of Birth: 20-Aug-1956  Today's Date: 12/11/2018  Problem List:  Patient Active Problem List   Diagnosis Date Noted  . Radiculopathy 12/10/2018  . Cervical myelopathy (HCC) 12/07/2018  . Microcytic hypochromic anemia 08/27/2018  . At risk for adverse drug event 08/10/2018  . Chronic pain syndrome   . Labile blood pressure   . Urinary retention   . Hypokalemia   . Hypertension   . Acute blood loss anemia   . Anemia of chronic disease   . Abscess in epidural space of lumbar spine   . Effusion, left knee   . Pain and swelling of wrist, left   . Complete tear of left rotator cuff 07/03/2018    Class: Chronic  . MSSA bacteremia   . Septic arthritis of knee, left (HCC) 06/30/2018    Class: Acute  . Leukocytosis   . Postoperative seroma involving nervous system after nervous system procedure   . Shoulder pain, bilateral   . Thoracic myelopathy 06/28/2018  . Spondylosis, thoracic, with myelopathy 06/28/2018    Class: Acute  . Abscess in epidural space of L2-L5 lumbar spine 06/28/2018    Class: Acute  . Acute urinary retention 06/28/2018  . Left cervical radiculopathy 06/28/2018    Class: Acute  . Spinal stenosis of lumbar region 11/17/2017    Class: Chronic  . Status post lumbar laminectomy 11/17/2017  . Lumbar stenosis with neurogenic claudication 08/18/2017  . Forestier's disease of thoracolumbar region 08/18/2017  . Degenerative disc disease, lumbar 08/18/2017  . Lumbar radiculopathy 09/30/2016  . Morbid (severe) obesity due to excess calories (HCC) 09/30/2016  . Biliary calculus with acute cholecystitis 03/21/2016  . Acute pyelonephritis 12/28/2013  . Ureteral stone with hydronephrosis 12/28/2013  . Acute kidney failure (HCC) 12/27/2013  . Severe sepsis with acute organ dysfunction (HCC) 12/27/2013  . Hypotension 12/27/2013  . Lactic acidosis 12/27/2013  . Sepsis  secondary to UTI (HCC) 12/27/2013  . EMPYEMA CHEST 08/30/2008  . PRURIGO 08/30/2008  . OBSTRUCTIVE SLEEP APNEA 08/30/2008   Past Medical History:  Past Medical History:  Diagnosis Date  . Anemia   . Arthritis   . Chronic kidney disease    HX acute kidney failure / acute pyelonephritis / hydronephrosis / severe sepsis per discharge summary 12/27/13  . GERD (gastroesophageal reflux disease)   . History of kidney stones   . Hypertension   . Morbid obesity (HCC)   . Obstructive sleep apnea    does not need c pap since 110 lb wt loss  . Osteoporosis   . Prurigo 2002  . Scars    ON ARMS FROM CHEMICAL EXPLOSION 1999  . Spinal stenosis    Past Surgical History:  Past Surgical History:  Procedure Laterality Date  . ANTERIOR CERVICAL DECOMP/DISCECTOMY FUSION N/A 12/07/2018   Procedure: ANTERIOR CERVICAL DECOMPRESSION FUSION - CERVICAL THREE-CERVICAL FOUR, CERVICAL FOUR-CERVICAL FIVE, CERVICAL FIVE-CERVICAL SIX;  Surgeon: Julio Sicks, MD;  Location: [facility];  Service: Neurosurgery;  Laterality: N/A;  . BACK SURGERY  01/23/2010   lumbar  . CHOLECYSTECTOMY N/A 03/24/2016   Procedure: LAPAROSCOPIC CHOLECYSTECTOMY WITH INTRAOPERATIVE CHOLANGIOGRAM;  Surgeon: Chevis Pretty III, MD;  Location: [facility];  Service: General;  Laterality: N/A;  . CIRCUMCISION    . CYSTOSCOPY WITH RETROGRADE PYELOGRAM, URETEROSCOPY AND STENT PLACEMENT Left 01/20/2014   Procedure: CYSTOSCOPY WITH RETROGRADE PYELOGRAM, URETEROSCOPY AND STENT EXCHANGE;  Surgeon: Milford Cage, MD;  Location: [facility];  Service: Urology;  Laterality: Left;  bugbee bladder fulguration  . CYSTOSCOPY WITH STENT PLACEMENT Left 12/28/2013   Procedure: CYSTOSCOPY WITH STENT PLACEMENT left retrograde pyleogram;  Surgeon: Milford Cage, MD;  [facility];  Service: Urology;  Laterality: Left;  . ESOPHAGOGASTRODUODENOSCOPY N/A 12/07/2012   Procedure: ESOPHAGOGASTRODUODENOSCOPY (EGD);  Surgeon: Lodema Pilot, DO;  [facility];  Service:  General;  Laterality: N/A;  . HOLMIUM LASER APPLICATION Left 01/20/2014   Procedure: HOLMIUM LASER APPLICATION;  Surgeon: Milford Cage, MD;  [facility];  Service: Urology;  Laterality: Left;  . KNEE ARTHROSCOPY Left 06/29/2018   Procedure: ARTHROSCOPY KNEE;  Surgeon: Kerrin Champagne, MD;  Location: [facility] OR;  Service: Orthopedics;  Laterality: Left;  . LAPAROSCOPIC GASTRIC SLEEVE RESECTION N/A 12/07/2012   Procedure: LAPAROSCOPIC GASTRIC SLEEVE RESECTION;  Surgeon: Lodema Pilot, DO;  [facility];  Service: General;  Laterality: N/A;  laparoscopic sleeve gastrectomy with EGD  . LUMBAR LAMINECTOMY N/A 11/17/2017   Procedure: L2-3 LAMINECTOMY AND REDO LAMINECTOMIES  L3-4, L4-5 AND L5-S1;  Surgeon: Kerrin Champagne, MD;  [facility];  Service: Orthopedics;  Laterality: N/A;  . LUMBAR WOUND DEBRIDEMENT N/A 06/29/2018   Procedure: LUMBAR WOUND DEBRIDEMENT DRAINAGE AND IRRIGATION; AND ASPIRATION OF LEFT KNEE;  Surgeon: Kerrin Champagne, MD;  [facility];  Service: Orthopedics;  Laterality: N/A;  . TEE WITHOUT CARDIOVERSION N/A 07/01/2018   Procedure: TRANSESOPHAGEAL ECHOCARDIOGRAM (TEE);  Surgeon: Laurey Morale, MD;  Location: [facility] ENDOSCOPY;  Service: Cardiovascular;  Laterality: N/A;   Social History:  reports that he quit smoking about 44 years ago. His smoking use included cigarettes. He has never used smokeless tobacco. He reports that he does not drink alcohol or use drugs.  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   Abuse/Neglect Abuse/Neglect Assessment Can Be Completed: Yes Physical Abuse: Denies Verbal Abuse: Denies Sexual Abuse: Denies Exploitation of patient/patient's resources: Denies Self-Neglect: Denies  Emotional Status Pt's affect, behavior and adjustment status: Pt very pleasant, talkative and completes assessment interview without any difficulty.  Wife present and supportive.  Both pleased that he is able to return to CIR and that he is a better functional state that prior CIR stay. Optimistic about therapy and overall recovery.  Denies any significant emotional distress - will monitor. Recent Psychosocial Issues: Pt has had a difficult recovery from back surgery in Sept 2019 but was begin some pre-gait at home with [facility]. Psychiatric History: None Substance Abuse History: None  Patient / Family Perceptions, Expectations & Goals Pt/Family understanding of illness & functional limitations: Pt and wife with good understanding of surgery performed and current functional deficits and need for CIR return. Premorbid pt/family roles/activities: Pt needed some assistance PTA and wife is caregiver. Anticipated changes in roles/activities/participation: Little change anticipated as wife was assisting PTA Pt/family expectations/goals: "Hope I'll leave here doing a lot better."  [employer]: None Premorbid Home Care/DME Agencies: Other (Comment)([place]) Transportation available at discharge: yes  Discharge  Planning Living Arrangements: Spouse/significant other Support Systems: Spouse/significant other Type of Residence: Private residence Insurance Resources: Media planner (specify)([employer]) Financial Resources: Other (Comment)(retirement) Financial Screen Referred: No Living Expenses:  Mortgage Money Management: Spouse, Patient Does the patient have any problems obtaining your medications?: No Home Management: mostly wife Patient/Family Preliminary Plans: Pt to return home with wife to provide needed assistance. Social Work Anticipated Follow Up Needs: HH/OP Expected length of stay: 16-18 days  Clinical Impression Very pleasant gentleman who returns to CIR (here 06/2018) following cervical surgery.  Functionally at a better level than prior stay.  Wife very supportive and able to provide any assistance at d/c.  Pt denies any significant emotional distress, however, will monitor.  Motivated for therapies to start up!   Zariah Cavendish 12/11/2018, 3:37 PM

## 2018-12-11 NOTE — Evaluation (Signed)
Occupational Therapy Assessment and Plan  Patient Details  Name: Boruch Manuele MRN: [id number] Date of Birth: 01-30-1956  OT Diagnosis: abnormal posture, muscle weakness (generalized) and paraparesis  Rehab Potential: Rehab Potential (ACUTE ONLY): Good ELOS: 16-18 days   Today's Date: 12/11/2018 OT Individual Time: 1020-1119 OT Individual Time Calculation (min): 59 min     Problem List:  Patient Active Problem List   Diagnosis Date Noted  . Radiculopathy 12/10/2018  . Cervical myelopathy ([place]) 12/07/2018  . Microcytic hypochromic anemia 08/27/2018  . At risk for adverse drug event 08/10/2018  . Chronic pain syndrome   . Labile blood pressure   . Urinary retention   . Hypokalemia   . Hypertension   . Acute blood loss anemia   . Anemia of chronic disease   . Abscess in epidural space of lumbar spine   . Effusion, left knee   . Pain and swelling of wrist, left   . Complete tear of left rotator cuff 07/03/2018    Class: Chronic  . MSSA bacteremia   . Septic arthritis of knee, left ([place]) 06/30/2018    Class: Acute  . Leukocytosis   . Postoperative seroma involving nervous system after nervous system procedure   . Shoulder pain, bilateral   . Thoracic myelopathy 06/28/2018  . Spondylosis, thoracic, with myelopathy 06/28/2018    Class: Acute  . Abscess in epidural space of L2-L5 lumbar spine 06/28/2018    Class: Acute  . Acute urinary retention 06/28/2018  . Left cervical radiculopathy 06/28/2018    Class: Acute  . Spinal stenosis of lumbar region 11/17/2017    Class: Chronic  . Status post lumbar laminectomy 11/17/2017  . Lumbar stenosis with neurogenic claudication 08/18/2017  . Forestier's disease of thoracolumbar region 08/18/2017  . Degenerative disc disease, lumbar 08/18/2017  . Lumbar radiculopathy 09/30/2016  . Morbid (severe) obesity due to excess calories ([place]) 09/30/2016  . Biliary calculus with acute cholecystitis 03/21/2016  . Acute pyelonephritis 12/28/2013   . Ureteral stone with hydronephrosis 12/28/2013  . Acute kidney failure ([place]) 12/27/2013  . Severe sepsis with acute organ dysfunction ([place]) 12/27/2013  . Hypotension 12/27/2013  . Lactic acidosis 12/27/2013  . Sepsis secondary to UTI ([place]) 12/27/2013  . EMPYEMA CHEST 08/30/2008  . PRURIGO 08/30/2008  . OBSTRUCTIVE SLEEP APNEA 08/30/2008    Past Medical History:  Past Medical History:  Diagnosis Date  . Anemia   . Arthritis   . Chronic kidney disease    HX acute kidney failure / acute pyelonephritis / hydronephrosis / severe sepsis per discharge summary 12/27/13  . GERD (gastroesophageal reflux disease)   . History of kidney stones   . Hypertension   . Morbid obesity ([place])   . Obstructive sleep apnea    does not need c pap since 110 lb wt loss  . Osteoporosis   . Prurigo 2002  . Scars    ON ARMS FROM CHEMICAL EXPLOSION 1999  . Spinal stenosis    Past Surgical History:  Past Surgical History:  Procedure Laterality Date  . ANTERIOR CERVICAL DECOMP/DISCECTOMY FUSION N/A 12/07/2018   Procedure: ANTERIOR CERVICAL DECOMPRESSION FUSION - CERVICAL THREE-CERVICAL FOUR, CERVICAL FOUR-CERVICAL FIVE, CERVICAL FIVE-CERVICAL SIX;  Surgeon: Earnie Larsson, MD;  Location: [facility];  Service: Neurosurgery;  Laterality: N/A;  . BACK SURGERY  01/23/2010   lumbar  . CHOLECYSTECTOMY N/A 03/24/2016   Procedure: LAPAROSCOPIC CHOLECYSTECTOMY WITH INTRAOPERATIVE CHOLANGIOGRAM;  Surgeon: Autumn Messing III, MD;  [facility];  Service: General;  Laterality: N/A;  . CIRCUMCISION    .  CYSTOSCOPY WITH RETROGRADE PYELOGRAM, URETEROSCOPY AND STENT PLACEMENT Left 01/20/2014   Procedure: CYSTOSCOPY WITH RETROGRADE PYELOGRAM, URETEROSCOPY AND STENT EXCHANGE;  Surgeon: Molli Hazard, MD;  [facility];  Service: Urology;  Laterality: Left;  bugbee bladder fulguration  . CYSTOSCOPY WITH STENT PLACEMENT Left 12/28/2013   Procedure: CYSTOSCOPY WITH STENT PLACEMENT left retrograde pyleogram;  Surgeon: Molli Hazard, MD;  [facility];  Service: Urology;  Laterality: Left;  . ESOPHAGOGASTRODUODENOSCOPY N/A 12/07/2012   Procedure: ESOPHAGOGASTRODUODENOSCOPY (EGD);  Surgeon: Madilyn Hook, DO;  [facility];  Service: General;  Laterality: N/A;  . HOLMIUM LASER APPLICATION Left 02/14/2991   Procedure: HOLMIUM LASER APPLICATION;  Surgeon: Molli Hazard, MD;  [facility];  Service: Urology;  Laterality: Left;  . KNEE ARTHROSCOPY Left 06/29/2018   Procedure: ARTHROSCOPY KNEE;  Surgeon: Jessy Oto, MD;  Location: [facility];  Service: Orthopedics;  Laterality: Left;  . LAPAROSCOPIC GASTRIC SLEEVE RESECTION N/A 12/07/2012   Procedure: LAPAROSCOPIC GASTRIC SLEEVE RESECTION;  Surgeon: Madilyn Hook, DO;  [facility];  Service: General;  Laterality: N/A;  laparoscopic sleeve gastrectomy with EGD  . LUMBAR LAMINECTOMY N/A 11/17/2017   Procedure: L2-3 LAMINECTOMY AND REDO LAMINECTOMIES  L3-4, L4-5 AND L5-S1;  Surgeon: Jessy Oto, MD;  Location: [facility];  Service: Orthopedics;  Laterality: N/A;  . LUMBAR WOUND DEBRIDEMENT N/A 06/29/2018   Procedure: LUMBAR WOUND DEBRIDEMENT DRAINAGE AND IRRIGATION; AND ASPIRATION OF LEFT KNEE;  Surgeon: Jessy Oto, MD;  Location: [facility];  Service: Orthopedics;  Laterality: N/A;  . TEE WITHOUT CARDIOVERSION N/A 07/01/2018   Procedure: TRANSESOPHAGEAL ECHOCARDIOGRAM (TEE);  Surgeon: Larey Dresser, MD;  Location: [facility] ENDOSCOPY;  Service: Cardiovascular;  Laterality: N/A;    Assessment & Plan Clinical Impression: Hansford Hirt is a 63 year old male with history of CKD, HTN, morbid obesity- BMI- 44, BPH-h/o retention, lumbar decompression 01/2682 complicated by post op infection and septic knee with CIR stay 07/2018, neck pain with progressive BUE/BLE weakness with inability to walk due to severe cervical stenosis C3-C6 with radiculopathy. Back wound healed with infection resolved and patient admitted on 12/07/18 for ACDF C3-C6 by Dr. Annette Stable. Patient with  significant debility with myelopathy and has had HHPT/HHOT ongoing and attempting SB transfers. He continues to have deficits in mobility and self care tasks. CIR recommended for follow up therapy.   Patient currently requires max-2 helperswith basic self-care skills secondary to muscle weakness and muscle paralysis, decreased cardiorespiratoy endurance, unbalanced muscle activation and decreased standing balance and decreased balance strategies.  Prior to hospitalization, patient could complete BADLs with max.  Patient will benefit from skilled intervention to increase independence with basic self-care skills prior to discharge home with spouse.  Anticipate patient will require 24 hour supervision, minimal physical assistance and moderate physical assestance and follow up home health.  OT - End of Session Endurance Deficit: Yes Endurance Deficit Description: frequent rest breaks OT Assessment Rehab Potential (ACUTE ONLY): Good OT Barriers to Discharge: Medical stability;Incontinence;Neurogenic Bowel & Bladder;Weight OT Patient demonstrates impairments in the following area(s): Balance;Nutrition;Safety;Endurance;Sensory;Motor;Skin Integrity OT Basic ADL's Functional Problem(s): Grooming;Bathing;Dressing;Toileting OT Advanced ADL's Functional Problem(s): (N/A as he did not complete PTA) OT Transfers Functional Problem(s): Toilet;Tub/Shower OT Additional Impairment(s): Fuctional Use of Upper Extremity OT Plan OT Intensity: Minimum of 1-2 x/day, 45 to 90 minutes OT Frequency: 5 out of 7 days OT Duration/Estimated Length of Stay: 16-18 days OT Treatment/Interventions: Balance/vestibular training;Community reintegration;Disease mangement/prevention;Neuromuscular re-education;Patient/family education;Self Care/advanced ADL retraining;Splinting/orthotics;Therapeutic Exercise;UE/LE Coordination activities;Wheelchair propulsion/positioning;UE/LE Strength taining/ROM;Therapeutic  Activities;Skin  care/wound managment;Psychosocial support;Pain management;Functional mobility training;DME/adaptive equipment instruction;Discharge planning OT Self Feeding Anticipated Outcome(s): No goal OT Basic Self-Care Anticipated Outcome(s): Min A UB, Mod A LB OT Toileting Anticipated Outcome(s): Max A OT Bathroom Transfers Anticipated Outcome(s): Min A OT Recommendation Recommendations for Other Services: Therapeutic Recreation consult Therapeutic Recreation Interventions: Pet therapy Patient destination: Home Follow Up Recommendations: Home health OT Equipment Recommended: To be determined   Skilled Therapeutic Intervention Skilled OT session completed with focus on initial evaluation, education on OT role/POC, and establishment of patient centered goals. Pt completed bathing and dressing w/c level at sink, sit<stand in bariatric Stedy. Max A for sit<stand for perihygiene. Pt incontinent of bowels and required 2 assist for hygiene and brief change. 2 assist for elevating pants over hips as well. Pt reports having AE at home (reacher + sock aide) that he used during previous CIR stay. Max A for LB overall. Mod A for UB due to decreased B UE AROM (<90 degrees). He needed assist for thoroughness when reaching underarms to wash and apply deoderent. Also discussed need for bowel program with RN. Educated Therapist, sports and NT to not use BSC in room to transfer pt until therapy finds another BSC to accommodate his weight. They verbalized understanding. With increased time pt able to anterioraly weight shift to reach needed items with Min A. At end of session pt was left with all needs within reach, spouse present, and safety belt fastened.    OT Evaluation Precautions/Restrictions  Precautions Precautions: Cervical;Fall Precaution Comments: reviewed cervical precautions with patient, min cueing to recall   Required Braces or Orthoses: Cervical Brace Cervical Brace: Soft collar Restrictions Weight Bearing Restrictions:  No General Chart Reviewed: Yes Family/Caregiver Present: Yes(spouse [name]) Pain Pain Assessment Pain Scale: 0-10 Pain Score: 5  Pain Location: Neck Pain Orientation: Posterior Pain Descriptors / Indicators: Aching Pain Frequency: Intermittent Pain Onset: On-going Pain Intervention(s): Medication (See eMAR) Home Living/Prior [place] expects to be discharged to:: Private residence Living Arrangements: Spouse/significant other Available Help at Discharge: Family, Available 24 hours/day(pt reports he will not have PCA at time of d/c) Type of Home: House Home Access: Level entry Home Layout: One level Bathroom Shower/Tub: Multimedia programmer: Handicapped height Bathroom Accessibility: No Additional Comments: Cannot access bathroom with w/c or walker. Pt reports completing bathing/dressing bedlevel with spouse providing total assist  Lives With: Spouse IADL History Homemaking Responsibilities: No Occupation: Retired Type of Occupation: Chef Leisure and Hobbies: "trolling" people on Facebook IADL Comments: Spouse assisted with all IADLs Prior Function Level of Independence: Needs assistance with ADLs, Needs assistance with homemaking, Needs assistance with tranfers  Able to Take Stairs?: No Driving: No Comments: Pt reports he was using a slideboard PTA. Spouse provided Min A to place slideboard and steady equipment, and he as able to scoot himself ADL ADL Grooming: Minimal assistance Where Assessed-Grooming: Sitting at sink Upper Body Bathing: Minimal assistance Where Assessed-Upper Body Bathing: Sitting at sink Lower Body Bathing: Other (comment), Dependent(2 assist) Where Assessed-Lower Body Bathing: Wheelchair, Standing at sink, Sitting at sink(using Jeralene Huff) Upper Body Dressing: Moderate assistance Where Assessed-Upper Body Dressing: Sitting at sink Lower Body Dressing: Other (Comment), Dependent(2 assist) Where  Assessed-Lower Body Dressing: Wheelchair, Standing at sink, Sitting at sink(using Jeralene Huff) Toileting: Not assessed Toilet Transfer: Not assessed Tub/Shower Transfer: Not assessed Vision Baseline Vision/History: Wears glasses Wears Glasses: Reading only Patient Visual Report: No change from baseline Vision Assessment?: No apparent visual deficits Perception  Perception: Within Functional Limits Praxis  Praxis: Intact Cognition Overall Cognitive Status: Within Functional Limits for tasks assessed Arousal/Alertness: Awake/alert Orientation Level: Person;Place;Situation Place: Oriented Situation: Oriented Year: 2020 Month: February Day of Week: Incorrect Memory: Appears intact Immediate Memory Recall: Sock;Blue;Bed Memory Recall: Sock;Blue;Bed Memory Recall Sock: Without Cue Memory Recall Blue: Without Cue Memory Recall Bed: With Cue Attention: Sustained Sustained Attention: Appears intact Awareness: Appears intact Problem Solving: Appears intact Safety/Judgment: Appears intact Sensation Sensation Light Touch: Appears Intact(B UEs) Light Touch Impaired Details: Impaired RLE;Impaired LLE(distal > proximal) Proprioception: Impaired Detail Proprioception Impaired Details: Impaired RLE;Impaired LLE(distal > proximal) Coordination Gross Motor Movements are Fluid and Coordinated: No Fine Motor Movements are Fluid and Coordinated: No Coordination and Movement Description: Impacted by parapresis. Fine motor abilities affected by ROM limitations Lt digits  Finger Nose Finger Test: Grossly intact bilaterally  Heel Shin Test: unable to assess 2/2 weakness Motor  Motor Motor: Paraplegia Motor - Skilled Clinical Observations: BLE weakness 2/2 radiculopathy, R>L Mobility  Bed Mobility Bed Mobility: Rolling Right;Rolling Left;Supine to Sit;Sit to Supine Rolling Right: Maximal Assistance - Patient 25-49% Rolling Left: Maximal Assistance - Patient 25-49% Supine to Sit: Maximal  Assistance - Patient - Patient 25-49% Sit to Supine: Maximal Assistance - Patient 25-49% Transfers Sit to Stand: Maximal Assistance - Patient 25-49%  Trunk/Postural Assessment  Cervical Assessment Cervical Assessment: Exceptions to WFL(Cervical precautions) Thoracic Assessment Thoracic Assessment: Exceptions to WFL(rounded shoulders) Lumbar Assessment Lumbar Assessment: Exceptions to WFL(posterior pelvic tilt) Postural Control Postural Control: Within Functional Limits  Balance Balance Balance Assessed: Yes Static Sitting Balance Dynamic Sitting Balance Dynamic Sitting - Balance Support: Feet supported;During functional activity Dynamic Sitting - Level of Assistance: 5: Stand by assistance(washing LEs while leaning forward in w/c) Dynamic Standing Balance Dynamic Standing - Level of Assistance: 1: +2 Total assist(with use of Bariatric Stedy, perihygiene completion in [place]) Extremity/Trunk Assessment RUE Assessment RUE Assessment: Exceptions to [facility] Active Range of Motion (AROM) Comments: About 80 degrees shoulder flexion/abduction. Limited internal rotation  General Strength Comments: 3-/5 grossly  RUE Body System: Neuro LUE Assessment LUE Assessment: Exceptions to [facility] Active Range of Motion (AROM) Comments: 60-70 degrees shoulder flexion/abduction. Limited digit ROM  General Strength Comments: 3-/5 grossly  LUE Body System: Neuro   Refer to Care Plan for Long Term Goals  Recommendations for other services: Therapeutic Recreation  Pet therapy   Discharge Criteria: Patient will be discharged from OT if patient refuses treatment 3 consecutive times without medical reason, if treatment goals not met, if there is a change in medical status, if patient makes no progress towards goals or if patient is discharged from hospital.  The above assessment, treatment plan, treatment alternatives and goals were discussed and mutually agreed upon: by patient and by family  Skeet Simmer 12/11/2018, 12:51 PM

## 2018-12-12 ENCOUNTER — Inpatient Hospital Stay (HOSPITAL_COMMUNITY): Payer: No Typology Code available for payment source | Admitting: Physical Therapy

## 2018-12-12 ENCOUNTER — Inpatient Hospital Stay (HOSPITAL_COMMUNITY): Payer: No Typology Code available for payment source | Admitting: Occupational Therapy

## 2018-12-12 DIAGNOSIS — M21371 Foot drop, right foot: Secondary | ICD-10-CM

## 2018-12-12 DIAGNOSIS — M4712 Other spondylosis with myelopathy, cervical region: Secondary | ICD-10-CM

## 2018-12-12 MED ORDER — POTASSIUM CHLORIDE CRYS ER 20 MEQ PO TBCR
20.0000 meq | EXTENDED_RELEASE_TABLET | Freq: Three times a day (TID) | ORAL | Status: DC
  Administered 2018-12-12 – 2018-12-13 (×3): 20 meq via ORAL
  Filled 2018-12-12 (×6): qty 1

## 2018-12-12 NOTE — Progress Notes (Signed)
Physical Therapy Session Note  Patient Details  Name: [name] [name] MRN: [id number] Date of Birth: 07-28-1956  Today's Date: 12/12/2018 PT Individual Time: 1600-1656 PT Individual Time Calculation (min): 56 min   Short Term Goals: Week 1:  PT Short Term Goal 1 (Week 1): Pt will complete rolling with mod A consistently PT Short Term Goal 2 (Week 1): Pt will complete least restrictive transfer with max A PT Short Term Goal 3 (Week 1): Pt will initiate w/c mobility  Skilled Therapeutic Interventions/Progress Updates:   Pt in w/c and agreeable to therapy, pain 6/10 in posterior neck and shoulders. Pt requesting to sit in his w/c that his wife brought from home. W/c from home w/ higher back and pt thinks it will support him more. Pain 4/10 in w/c from home. Stedy transfer to w/c, +2 to stand. Pt self-propelled w/c to/from therapy gym w/ BUEs to work on endurance training. Focused on LE strengthening in therapy gym. BLE exercises listed below w/ brief rest in between. Returned to gym total assist via w/c. Stedy +2 transfer back to supine, +2 assist to reposition in bed for pressure relief and positioning. Manual and verbal cues for supine bridge and to utilize UEs on grab bars. Ended session in supine, all needs in reach.   BLE strengthening -LAQs 3x10 -knee marches 3x10 -resisted abduction w/ green theraband 3x10 -heel slides 2x10 -adduction squeezes 2x10  Therapy Documentation Precautions:  Precautions Precautions: Cervical, Fall Precaution Comments: reviewed cervical precautions with patient, min cueing to recall   Required Braces or Orthoses: Cervical Brace Cervical Brace: Soft collar Restrictions Weight Bearing Restrictions: No Vital Signs: Therapy Vitals Temp: 98.3 F (36.8 C) Pulse Rate: 77 Resp: 14 BP: 140/80 Patient Position (if appropriate): Sitting Oxygen Therapy SpO2: 99 % O2 Device: Room Air  Therapy/Group: Individual Therapy  Tocara Mennen Melton Krebs 12/12/2018, 5:08 PM

## 2018-12-12 NOTE — Progress Notes (Addendum)
[facility] [facility] PHYSICAL MEDICINE & REHABILITATION PROGRESS NOTE   Subjective/Complaints:  Some neck pain early this am, relieved with hydrocodone   ROS- no CP, SOB, N/V/D  Objective:   No results found. Recent Labs    12/11/18 0646  WBC 6.1  HGB 10.3*  HCT 32.8*  PLT 241   Recent Labs    12/11/18 0646  NA 140  K 3.0*  CL 107  CO2 24  GLUCOSE 99  BUN 14  CREATININE 0.59*  CALCIUM 8.6*    Intake/Output Summary (Last 24 hours) at 12/12/2018 1116 Last data filed at 12/12/2018 0700 Gross per 24 hour  Intake 360 ml  Output 675 ml  Net -315 ml     Physical Exam: Vital Signs Blood pressure (!) 141/75, pulse 75, temperature 98 F (36.7 C), temperature source Oral, resp. rate 18, height 5\' 10"  (1.778 m), weight (!) 141.4 kg, SpO2 100 %.   General: No acute distress Mood and affect are appropriate Heart: Regular rate and rhythm no rubs murmurs or extra sounds Lungs: Clear to auscultation, breathing unlabored, no rales or wheezes Abdomen: Positive bowel sounds, soft nontender to palpation, nondistended Extremities: No clubbing, cyanosis, or edema Skin: No evidence of breakdown, no evidence of rash Neurologic: Cranial nerves II through XII intact, motor strength is 5/5 in bilateral deltoid, bicep, tricep, grip, hip flexor, 4/5 knee extensors, tr R and 4- Left ankle dorsiflexor and plantar flexor   Musculoskeletal: Full range of motion in all 4 extremities. No joint swelling   Assessment/Plan: 1. Functional deficits secondary to cervical myelopathy which require 3+ hours per day of interdisciplinary therapy in a comprehensive inpatient rehab setting.  Physiatrist is providing close team supervision and 24 hour management of active medical problems listed below.  Physiatrist and rehab team continue to assess barriers to discharge/monitor patient progress toward functional and medical goals  Care Tool:  Bathing    Body parts bathed by patient: Chest, Abdomen, Left  upper leg, Right upper leg, Face   Body parts bathed by helper: Left arm, Right arm, Front perineal area, Buttocks, Right lower leg, Left lower leg     Bathing assist Assist Level: Maximal Assistance - Patient 24 - 49%     Upper Body Dressing/Undressing Upper body dressing   What is the patient wearing?: Pull over shirt    Upper body assist Assist Level: Minimal Assistance - Patient > 75%    Lower Body Dressing/Undressing Lower body dressing      What is the patient wearing?: Incontinence brief, Pants     Lower body assist Assist for lower body dressing: 2 Helpers     Toileting Toileting    Toileting assist Assist for toileting: Maximal Assistance - Patient 25 - 49%     Transfers Chair/bed transfer  Transfers assist  Chair/bed transfer activity did not occur: Safety/medical concerns  Chair/bed transfer assist level: Dependent - mechanical lift(STEDY/ also slide board like PTA with +2 for placement min A for scooting)     Locomotion Ambulation   Ambulation assist   Ambulation activity did not occur: Safety/medical concerns          Walk 10 feet activity   Assist  Walk 10 feet activity did not occur: Safety/medical concerns        Walk 50 feet activity   Assist Walk 50 feet with 2 turns activity did not occur: Safety/medical concerns         Walk 150 feet activity   Assist Walk 150 feet activity  did not occur: Safety/medical concerns         Walk 10 feet on uneven surface  activity   Assist Walk 10 feet on uneven surfaces activity did not occur: Safety/medical concerns         Wheelchair     Assist Will patient use wheelchair at discharge?: Yes Type of Wheelchair: Manual Wheelchair activity did not occur: Safety/medical concerns         Wheelchair 50 feet with 2 turns activity    Assist    Wheelchair 50 feet with 2 turns activity did not occur: Safety/medical concerns       Wheelchair 150 feet activity      Assist Wheelchair 150 feet activity did not occur: Safety/medical concerns        Medical Problem List and Plan: 1. tetraparesis secondary to cervical myelopathy s/c C3-6 ACDF CIR PT, OT 2. Antithrombotics: -DVT/anticoagulation:Mechanical:Antiembolism stockings, knee (TED hose) Bilateral lower extremities Sequential compression devices, below kneeBilateral lower extremities -antiplatelet therapy: N/A 3. Pain Management:On Cymbalta daily. Continue hydrocodone prn 4. Mood:LCSW to follow for evaluation and support. -antipsychotic agents: N/A 5. Neuropsych: This patientiscapable of making decisions on hisown behalf. 6. Skin/Wound Care:Routine pressure relief measures. 7. Fluids/Electrolytes/Nutrition:Monitor I/O. 2/28 lytes.Hypo K BUN/Cr normal KCL TID , recheck 3/2 8. Urinary retention/BPH: Question plans for TURP. Continue Flomax. 9. Anemia of chronic disease: On iron for supplement. 10. Leucopenia: resolved. Recheck WBC normal 6.1 K on 2/28 11. Morbid obesity: Encourage appropriate diet and weight loss to help promote mobility and health.    LOS: 2 days A FACE TO FACE EVALUATION WAS PERFORMED  Erick Colace 12/12/2018, 11:16 AM

## 2018-12-12 NOTE — Progress Notes (Signed)
Occupational Therapy Session Note  Patient Details  Name: [name] [name] MRN: [id number] Date of Birth: 06-03-1956  Today's Date: 12/12/2018 OT Individual Time: [phone] and 1305-1401 OT Individual Time Calculation (min): 70 min and 56 min  Short Term Goals: Week 1:  OT Short Term Goal 1 (Week 1): Pt will complete 1/3 components of donning pants using AE as needed  OT Short Term Goal 2 (Week 1): Pt will complete bathing sit<stand with 1 assist  OT Short Term Goal 3 (Week 1): Pt will complete 1 grooming task while semi perched in St[place] to work on standing tolerance   Skilled Therapeutic Interventions/Progress Updates:    Pt greeted in bed, no c/o pain at rest. OT put on soft c collar and he completed supine<sit with Min-Mod A with use of bedrails. Sit<stand in [place] with Max A from elevated bed. Pt with bowel incontinence during transfer. He stood in semi perched position in [place] for 20 minutes during thorough perihygiene/brief change. Able to stand without paddle support for 2-3 minute intervals before needing to sit. Vcs required for tucking in buttocks to improve upright alignment. Pt able to assist a little with hygiene in the front, but required Max-Total A overall. He then c/o increased neck pain (5/10; "worse than yesterday"). He was slightly shaky. Transitioned to seated position and notified RN for pain medication. While waiting for RN, pt resumed UB bathing and completed oral care w/c level at sink. Pain improved but "still there." Issued pt LH sponge to assist with washing LEs. Able to lift each LE against gravity and maintain lifted position for short windows of time. Min A for overhead shirt. Pt able to thread B LEs into pants without AE today! During final sit<stand to elevate pants over hips, he required 2 assist for power up from low w/c due to fatigue. After handwashing (and removing Lt hand splint to inspect skin- no redness), pt opted to remain up in w/c. He was left in w/c with all  needs within reach and safety belt fastened. Tx focus placed on sit<stands, standing endurance/balance, and activity tolerance.   2nd Session 1:1 tx (56 min)  Pt greeted in w/c with no c/o pain. Started tx with shaving w/c level at sink. With increased time using adaptive strategies, pt able to reach necessary items for task. Heavy UE reliance for anterior weight shifts. Afterwards worked on UE and core strengthening. He self propelled to gym to gather necessary items. Pt with Lt veering due to weakness Lt>Rt. Escorted pt to dayroom to listen to meaningful music. While in dayroom, he engaged in B UE therapeutic exercise using orange tband x10-15 reps 2 sets. Guided pt through horizontal abduction, bicep curls, and tricep punches. A/AROM exercises without weight completed afterwards 10-15 reps. He then completed 3 minutes forward/backward rotations on arm bike with Min A for Lt hand positioning. W/c pushups completed next for strengthening and LE weightbearing, with pt able to maintain elevated position for 3-4 seconds maximum. At end of session he was escorted back to room and left with spouse present and safety belt fastened.   Pt denied pain during exercises.   Therapy Documentation Precautions:  Precautions Precautions: Cervical, Fall Precaution Comments: reviewed cervical precautions with patient, min cueing to recall   Required Braces or Orthoses: Cervical Brace Cervical Brace: Soft collar Restrictions Weight Bearing Restrictions: No Vital Signs: Therapy Vitals Temp: 98 F (36.7 C) Temp Source: Oral Pulse Rate: 75 Resp: 18 BP: (!) 141/75 Patient Position (if appropriate): Lying Oxygen  Therapy SpO2: 100 % O2 Device: Room Air Pain: In neck. Medicated during 1st session  Pain Assessment Pain Scale: 0-10 Pain Score: 6  Pain Location: Neck Pain Orientation: Posterior Pain Descriptors / Indicators: Aching Pain Frequency: Constant Pain Onset: On-going Pain Intervention(s):  Medication (See eMAR) ADL: ADL Grooming: Minimal assistance Where Assessed-Grooming: Sitting at sink Upper Body Bathing: Minimal assistance Where Assessed-Upper Body Bathing: Sitting at sink Lower Body Bathing: Other (comment), Dependent(2 assist) Where Assessed-Lower Body Bathing: Wheelchair, Standing at sink, Sitting at sink(using Geralyn Corwin) Upper Body Dressing: Moderate assistance Where Assessed-Upper Body Dressing: Sitting at sink Lower Body Dressing: Other (Comment), Dependent(2 assist) Where Assessed-Lower Body Dressing: Wheelchair, Standing at sink, Sitting at sink(using Geralyn Corwin) Toileting: Not assessed Toilet Transfer: Not assessed Tub/Shower Transfer: Not assessed      Therapy/Group: Individual Therapy   Rettie Laird A Cheyrl Buley 12/12/2018, 9:07 AM

## 2018-12-13 ENCOUNTER — Inpatient Hospital Stay (HOSPITAL_COMMUNITY): Payer: No Typology Code available for payment source | Admitting: Occupational Therapy

## 2018-12-13 NOTE — Progress Notes (Signed)
[place] PHYSICAL MEDICINE & REHABILITATION PROGRESS NOTE   Subjective/Complaints:  No neck pain today.  His strength seems about the same today has chronic right foot drop and wears an AFO even prior to this admission. Had a bowel movement today.  Has a chronic Foley which was changed last week  ROS- no CP, SOB, N/V/D  Objective:   No results found. Recent Labs    12/11/18 0646  WBC 6.1  HGB 10.3*  HCT 32.8*  PLT 241   Recent Labs    12/11/18 0646  NA 140  K 3.0*  CL 107  CO2 24  GLUCOSE 99  BUN 14  CREATININE 0.59*  CALCIUM 8.6*    Intake/Output Summary (Last 24 hours) at 12/13/2018 1104 Last data filed at 12/13/2018 0930 Gross per 24 hour  Intake 699 ml  Output 1200 ml  Net -501 ml     Physical Exam: Vital Signs Blood pressure (!) 151/90, pulse 70, temperature 98.3 F (36.8 C), resp. rate 16, height 5\' 10"  (1.778 m), weight (!) 141.4 kg, SpO2 100 %.   General: No acute distress Mood and affect are appropriate Heart: Regular rate and rhythm no rubs murmurs or extra sounds Lungs: Clear to auscultation, breathing unlabored, no rales or wheezes Abdomen: Positive bowel sounds, soft nontender to palpation, nondistended Extremities: No clubbing, cyanosis, or edema Skin: No evidence of breakdown, no evidence of rash Neurologic: Cranial nerves II through XII intact, motor strength is 5/5 in bilateral deltoid, bicep, tricep, grip, hip flexor, 4/5 knee extensors, tr R and 4- Left ankle dorsiflexor and plantar flexor   Musculoskeletal: Full range of motion in all 4 extremities. No joint swelling   Assessment/Plan: 1. Functional deficits secondary to cervical myelopathy which require 3+ hours per day of interdisciplinary therapy in a comprehensive inpatient rehab setting.  Physiatrist is providing close team supervision and 24 hour management of active medical problems listed below.  Physiatrist and rehab team continue to assess barriers to discharge/monitor  patient progress toward functional and medical goals  Care Tool:  Bathing    Body parts bathed by patient: Chest, Abdomen, Left upper leg, Right upper leg, Face   Body parts bathed by helper: Left arm, Right arm, Front perineal area, Buttocks, Right lower leg, Left lower leg     Bathing assist Assist Level: Maximal Assistance - Patient 24 - 49%     Upper Body Dressing/Undressing Upper body dressing   What is the patient wearing?: Pull over shirt    Upper body assist Assist Level: Minimal Assistance - Patient > 75%    Lower Body Dressing/Undressing Lower body dressing      What is the patient wearing?: Incontinence brief, Pants     Lower body assist Assist for lower body dressing: 2 Helpers     Toileting Toileting    Toileting assist Assist for toileting: 2 Helpers     Transfers Chair/bed transfer  Transfers assist  Chair/bed transfer activity did not occur: Safety/medical concerns  Chair/bed transfer assist level: Dependent - mechanical lift(stedy)     Locomotion Ambulation   Ambulation assist   Ambulation activity did not occur: Safety/medical concerns          Walk 10 feet activity   Assist  Walk 10 feet activity did not occur: Safety/medical concerns        Walk 50 feet activity   Assist Walk 50 feet with 2 turns activity did not occur: Safety/medical concerns         Walk  150 feet activity   Assist Walk 150 feet activity did not occur: Safety/medical concerns         Walk 10 feet on uneven surface  activity   Assist Walk 10 feet on uneven surfaces activity did not occur: Safety/medical concerns         Wheelchair     Assist Will patient use wheelchair at discharge?: Yes Type of Wheelchair: Manual Wheelchair activity did not occur: Safety/medical concerns  Wheelchair assist level: Supervision/Verbal cueing Max wheelchair distance: 150'    Wheelchair 50 feet with 2 turns activity    Assist     Wheelchair 50 feet with 2 turns activity did not occur: Safety/medical concerns   Assist Level: Supervision/Verbal cueing   Wheelchair 150 feet activity     Assist Wheelchair 150 feet activity did not occur: Safety/medical concerns   Assist Level: Supervision/Verbal cueing    Medical Problem List and Plan: 1. tetraparesis secondary to cervical myelopathy s/c C3-6 ACDF CIR PT, OT 2. Antithrombotics: -DVT/anticoagulation:Mechanical:Antiembolism stockings, knee (TED hose) Bilateral lower extremities Sequential compression devices, below kneeBilateral lower extremities -antiplatelet therapy: N/A 3. Pain Management:On Cymbalta daily. Continue hydrocodone prn 4. Mood:LCSW to follow for evaluation and support. -antipsychotic agents: N/A 5. Neuropsych: This patientiscapable of making decisions on hisown behalf. 6. Skin/Wound Care:Routine pressure relief measures. 7. Fluids/Electrolytes/Nutrition:Monitor I/O. 2/28 lytes.Hypo K BUN/Cr normal KCL TID , recheck 3/2 8. Urinary retention/BPH: Question plans for TURP. Continue Flomax. Had Foley changed last week, consider voiding trial next week 9. Anemia of chronic disease: On iron for supplement. 10. Leucopenia: resolved. Recheck WBC normal 6.1 K on 2/28 11. Morbid obesity: Encourage appropriate diet and weight loss to help promote mobility and health.    LOS: 3 days A FACE TO FACE EVALUATION WAS PERFORMED  Erick Colace 12/13/2018, 11:04 AM

## 2018-12-13 NOTE — IPOC Note (Signed)
Overall Plan of Care [facility]) Patient Details Name: [name] [name] MRN: [id number] DOB: [birth year]  Admitting Diagnosis: <principal problem not specified>  Hospital Problems: Active Problems:   Radiculopathy     Functional Problem List: Nursing Bladder, Edema, Endurance, Medication Management, Motor, Perception, Safety, Sensory, Skin Integrity  PT Balance, Endurance, Motor, Pain, Safety, Sensory  OT Balance, Nutrition, Safety, Endurance, Sensory, Motor, Skin Integrity  SLP    TR         Basic ADL's: OT Grooming, Bathing, Dressing, Toileting     Advanced  ADL's: OT (N/A as he did not complete PTA)     Transfers: PT Bed Mobility, Bed to Chair, Car, Furniture, Civil Service fast streamer, Research scientist (life sciences): PT Ambulation, Psychologist, prison and probation services, Stairs     Additional Impairments: OT Fuctional Use of Upper Extremity  SLP        TR      Anticipated Outcomes Item Anticipated Outcome  Self Feeding No goal  Swallowing      Basic self-care  Min A UB, Mod A LB  Toileting  Max A   Bathroom Transfers Min A  Bowel/Bladder  mod I  Transfers  min A  Locomotion  min A and w/c level  Communication     Cognition     Pain  less than 2  Safety/Judgment  mod I   Therapy Plan: PT Intensity: Minimum of 1-2 x/day ,45 to 90 minutes PT Frequency: 5 out of 7 days PT Duration Estimated Length of Stay: 16-18 days OT Intensity: Minimum of 1-2 x/day, 45 to 90 minutes OT Frequency: 5 out of 7 days OT Duration/Estimated Length of Stay: 16-18 days      Team Interventions: Nursing Interventions Patient/Family Education, Skin Care/Wound Management, Psychosocial Support, Bladder Management, Disease Management/Prevention, Discharge Planning  PT interventions Balance/vestibular training, Community reintegration, Discharge planning, Disease management/prevention, DME/adaptive equipment instruction, Functional electrical stimulation, Functional mobility training, Neuromuscular re-education,  Pain management, Patient/family education, Psychosocial support, Splinting/orthotics, Therapeutic Activities, Therapeutic Exercise, UE/LE Strength taining/ROM, UE/LE Coordination activities, Wheelchair propulsion/positioning  OT Interventions Warden/ranger, Community reintegration, Disease mangement/prevention, Neuromuscular re-education, Patient/family education, Self Care/advanced ADL retraining, Splinting/orthotics, Therapeutic Exercise, UE/LE Coordination activities, Wheelchair propulsion/positioning, UE/LE Strength taining/ROM, Therapeutic Activities, Skin care/wound managment, Psychosocial support, Pain management, Functional mobility training, DME/adaptive equipment instruction, Discharge planning  SLP Interventions    TR Interventions    SW/CM Interventions Discharge Planning, Psychosocial Support   Barriers to Discharge MD  Medical stability  Nursing Other (comments)    PT Medical stability    OT Medical stability, Incontinence, Neurogenic Bowel & Bladder, Weight    SLP      SW       Team Discharge Planning: Destination: PT-Home ,OT- Home , SLP-  Projected Follow-up: PT-Home health PT, OT-  Home health OT, SLP-  Projected Equipment Needs: PT-Other (comment)(pt already owns all necessary equipment), OT- To be determined, SLP-  Equipment Details: PT-pt already owns all necessary equipment, OT-  Patient/family involved in discharge planning: PT- Patient,  OT-Family Adult nurse, Patient([name]), SLP-   MD ELOS: 15-20d Medical Rehab Prognosis:  Good Assessment:  63 year old male with history of CKD, HTN, morbid obesity- BMI- 43, BPH-h/o retention, lumbar decompression 11/2017 complicated by post op infection and septic knee with CIR stay 07/2018, neck pain with progressive BUE/BLE weakness with inability to walk due to severe cervical stenosis C3-C6 with radiculopathy. Back wound healed with infection resolved and patient admitted on 12/07/18 for ACDF C3-C6 by Dr.  Jordan Likes. Patient with significant  debility with myelopathy and has had HHPT/HHOT ongoing and attempting SB transfers   Now requiring 24/7 Rehab RN,MD, as well as CIR level PT, OT and SLP.  Treatment team will focus on ADLs and mobility with goals set at Min/Mod A See Team Conference Notes for weekly updates to the plan of care

## 2018-12-13 NOTE — Progress Notes (Signed)
Occupational Therapy Session Note  Patient Details  Name: [name] [name] MRN: [id number] Date of Birth: [birth year]  Today's Date: 12/13/2018 OT Individual Time: 5102-5852 OT Individual Time Calculation (min): 55 min   Short Term Goals: Week 1:  OT Short Term Goal 1 (Week 1): Pt will complete 1/3 components of donning pants using AE as needed  OT Short Term Goal 2 (Week 1): Pt will complete bathing sit<stand with 1 assist  OT Short Term Goal 3 (Week 1): Pt will complete 1 grooming task while semi perched in [place] to work on standing tolerance   Skilled Therapeutic Interventions/Progress Updates:    Pt greeted in bed, reported increased neck pain and requesting for bedlevel/EOB tx. Pt premedicated. OT donned c collar for support. He had a large incontinent BM in brief, with overflow and soiled bed. 2 assist for perihygiene and bed change, with pt rolling Rt>Lt with Mod-Max A. He said this was the third incontinent BM today. RN made aware. After, he was agreeable to try eating EOB to work on sitting balance. Able to tolerate unsupported sitting for about 12 minutes before needing to lie back down due to pain. 2 assist for sit<supine and pt able to boost himself up in bed with bed placed in trendelenburg position. At end of tx pt was left in bed with all needs within reach, bed alarm set, and spouse present. Tx focus placed on UB strengthening and activity tolerance during functional tasks. RN made aware of pts continued pain c/o.    Therapy Documentation Precautions:  Precautions Precautions: Cervical, Fall Precaution Comments: reviewed cervical precautions with patient, min cueing to recall   Required Braces or Orthoses: Cervical Brace Cervical Brace: Soft collar Restrictions Weight Bearing Restrictions: No Pain: 5-6/10 in neck.  Pain Assessment Pain Scale: 0-10 Pain Score: 6  Pain Location: Neck Pain Orientation: Posterior Pain Descriptors / Indicators: Aching Pain Frequency:  Intermittent Pain Onset: On-going Pain Intervention(s): Medication (See eMAR) ADL: ADL Grooming: Minimal assistance Where Assessed-Grooming: Sitting at sink Upper Body Bathing: Minimal assistance Where Assessed-Upper Body Bathing: Sitting at sink Lower Body Bathing: Other (comment), Dependent(2 assist) Where Assessed-Lower Body Bathing: Wheelchair, Standing at sink, Sitting at sink(using Geralyn Corwin) Upper Body Dressing: Moderate assistance Where Assessed-Upper Body Dressing: Sitting at sink Lower Body Dressing: Other (Comment), Dependent(2 assist) Where Assessed-Lower Body Dressing: Wheelchair, Standing at sink, Sitting at sink(using Geralyn Corwin) Toileting: Not assessed Toilet Transfer: Not assessed Tub/Shower Transfer: Not assessed      Therapy/Group: Individual Therapy  Chlora Mcbain A Schyler Counsell 12/13/2018, 1:56 PM

## 2018-12-14 ENCOUNTER — Inpatient Hospital Stay (HOSPITAL_COMMUNITY): Payer: No Typology Code available for payment source | Admitting: Occupational Therapy

## 2018-12-14 ENCOUNTER — Inpatient Hospital Stay (HOSPITAL_COMMUNITY): Payer: No Typology Code available for payment source | Admitting: Physical Therapy

## 2018-12-14 DIAGNOSIS — N319 Neuromuscular dysfunction of bladder, unspecified: Secondary | ICD-10-CM

## 2018-12-14 DIAGNOSIS — K592 Neurogenic bowel, not elsewhere classified: Secondary | ICD-10-CM

## 2018-12-14 DIAGNOSIS — M75102 Unspecified rotator cuff tear or rupture of left shoulder, not specified as traumatic: Secondary | ICD-10-CM

## 2018-12-14 MED ORDER — ACETAMINOPHEN 325 MG PO TABS: 325.0000 mg | ORAL_TABLET | ORAL | Status: DC | PRN

## 2018-12-14 MED ORDER — POTASSIUM CHLORIDE CRYS ER 20 MEQ PO TBCR
20.0000 meq | EXTENDED_RELEASE_TABLET | Freq: Two times a day (BID) | ORAL | Status: DC
  Filled 2018-12-14 (×2): qty 1

## 2018-12-14 NOTE — Progress Notes (Signed)
Occupational Therapy Session Note  Patient Details  Name: [name] [name] MRN: [id number] Date of Birth: 09-14-1956  Today's Date: 12/14/2018 OT Individual Time: 1130-1200 OT Individual Time Calculation (min): 30 min    Short Term Goals: Week 1:  OT Short Term Goal 1 (Week 1): Pt will complete 1/3 components of donning pants using AE as needed  OT Short Term Goal 2 (Week 1): Pt will complete bathing sit<stand with 1 assist  OT Short Term Goal 3 (Week 1): Pt will complete 1 grooming task while semi perched in [place] to work on standing tolerance   Skilled Therapeutic Interventions/Progress Updates:    Pt received in w/c and discussed his shoulder pain.    Attempted a sit to stand in [place] with max A of 1, but pt was not able to pull to stand and the pulling motion hurt his shoulders.  Pt worked on exercises to increase shoulder mobility of scapular retraction and rolling shoulders back.  To increase his ability to eventually stand, pt worked on pushing through arms on his arm rests to develop tricep strength. Pt reported no discomfort in his shoulders.   Pt practiced lateral leans with pushing through his arms and forward leans for pressure relief practice.   Pt was able to do a few very small push ups.   Pt also practiced AROM of knee extension.    Discussed paying attention to pressure spots on his hips as his w/c is a bit tight. He can move one arm rest back to give him more space.    Pt in w/c with all needs met.    Therapy Documentation Precautions:  Precautions Precautions: Cervical, Fall Precaution Comments: reviewed cervical precautions with patient, min cueing to recall   Required Braces or Orthoses: Cervical Brace Cervical Brace: Soft collar Restrictions Weight Bearing Restrictions: No   Pain: Pain Assessment Pain Scale: 0-10 Pain Score: 10-Worst pain ever Pain Type: Acute pain Pain Location: Neck Pain Orientation: Posterior Pain Descriptors / Indicators:  Aching Pain Frequency: Constant Pain Onset: On-going Pain Intervention(s): Medication (See eMAR)    Therapy/Group: Individual Therapy  Pegge Cumberledge 12/14/2018, 12:26 PM

## 2018-12-14 NOTE — Progress Notes (Signed)
Physical Therapy Session Note  Patient Details  Name: [name] [name] MRN: [id number] Date of Birth: [birth year]  Today's Date: 12/14/2018 PT Individual Time: 0800-8030; 6283-1517 PT Individual Time Calculation (min): 30 min and 60 min   Short Term Goals: Week 1:  PT Short Term Goal 1 (Week 1): Pt will complete rolling with mod A consistently PT Short Term Goal 2 (Week 1): Pt will complete least restrictive transfer with max A PT Short Term Goal 3 (Week 1): Pt will initiate w/c mobility  Skilled Therapeutic Interventions/Progress Updates:    Session 1: Pt received supine in bed, agreeable to PT session. Pt reports 5/10 pain in neck, declines any intervention during session but does request pain medication from RN at end of session. Rolling L/R with max A and use of bedrails for dependent pericare and brief change following bowel incontinence. Pt is max A to don shorts with rolling L/R to pull up over bottom. Supine to sit with mod A with HOB elevated. Pt is min A to change shirt while seated EOB. Sit to stand with max A to stedy. Stedy transfer bed to w/c. Pt left seated in w/c in room with needs in reach, quick release belt in place.  Session 2: Pt received seated in w/c in room, agreeable to PT session. See pain details below. Manual w/c propulsion x 50 ft with use of BUE and Supervision. Slide board transfer w/c to/from mat table with min A with 2" step under BLE x 4 reps. Sit to stand from elevated mat table to stedy with max A. Sit to stand from stedy seat height with mod A, pt unable to reach full stand with trunk and hips flexed. Pt tolerates standing x 10 sec, x 25 sec, x 29 sec. Slide board transfer w/c to bed with min A, pt unable to maintain sitting balance and loses balance posteriorly. Mod A to get BLE into bed. Dependent to don pants and shirt and don new gown. Pt left supine in bed with needs in reach, bed alarm in place, wife present.   Therapy Documentation Precautions:   Precautions Precautions: Cervical, Fall Precaution Comments: reviewed cervical precautions with patient, min cueing to recall   Required Braces or Orthoses: Cervical Brace Cervical Brace: Soft collar Restrictions Weight Bearing Restrictions: No Pain: Pain Assessment Pain Scale: 0-10 Pain Score: 6  Pain Type: Acute pain Pain Location: Back Pain Orientation: Mid Pain Descriptors / Indicators: Aching Pain Frequency: Constant Pain Onset: On-going Pain Intervention(s): Medication (See eMAR)    Therapy/Group: Individual Therapy   Peter Congo, PT, DPT  12/14/2018, 3:52 PM

## 2018-12-14 NOTE — Care Management (Signed)
[facility] Individual Statement of Services  Patient Name:  [name] [name]  Date:  12/14/2018  Welcome to the [facility].  Our goal is to provide you with an individualized program based on your diagnosis and situation, designed to meet your specific needs.  With this comprehensive rehabilitation program, you will be expected to participate in at least 3 hours of rehabilitation therapies Monday-Friday, with modified therapy programming on the weekends.  Your rehabilitation program will include the following services:  Physical Therapy (PT), Occupational Therapy (OT), 24 hour per day rehabilitation nursing, Therapeutic Recreaction (TR), Neuropsychology, Case Management (Social Worker), Rehabilitation Medicine, Nutrition Services and Pharmacy Services  Weekly team conferences will be held on Tuesdays to discuss your progress.  Your Social Worker will talk with you frequently to get your input and to update you on team discussions.  Team conferences with you and your family in attendance may also be held.  Expected length of stay: 16-18 days   Overall anticipated outcome: min - max assistance  Depending on your progress and recovery, your program may change. Your Social Worker will coordinate services and will keep you informed of any changes. Your Social Worker's name and contact numbers are listed  below.  The following services may also be recommended but are not provided by the [facility]:   Driving Evaluations  Home Health Rehabiltiation Services  Outpatient Rehabilitation Services    Arrangements will be made to provide these services after discharge if needed.  Arrangements include referral to agencies that provide these services.  Your insurance has been verified to be:  [facility] Your primary doctor is:  [facility]  Pertinent information will be shared with your doctor and your insurance company.  Social Worker:  Staci Acosta,  LCSW  [phone] or [phone]  Information discussed with and copy given to patient by: Amada Jupiter, 12/14/2018, 9:26 AM

## 2018-12-14 NOTE — Progress Notes (Signed)
Called GU office for update on patient's care. He has been followed in their office every 4 weeks for foley change and he was set up with Dr. Annabell Howells on 3/4. Per records plans for urodynamic studies in a couple of weeks prior to foley removal/oiding trial.  Informed them that patient will likely be in rehab for next few weeks.

## 2018-12-14 NOTE — Progress Notes (Signed)
Educated patient on bowel training program. Patient expressed interest in better able to predict bowel movements, but has refused suppository and post-meal toileting. Will continue to educate.

## 2018-12-14 NOTE — Progress Notes (Signed)
Occupational Therapy Session Note  Patient Details  Name: [name] [name] MRN: [id number] Date of Birth: 1956/07/01  Today's Date: 12/14/2018 OT Individual Time: 0923-3007 OT Individual Time Calculation (min): 73 min   Short Term Goals: Week 1:  OT Short Term Goal 1 (Week 1): Pt will complete 1/3 components of donning pants using AE as needed  OT Short Term Goal 2 (Week 1): Pt will complete bathing sit<stand with 1 assist  OT Short Term Goal 3 (Week 1): Pt will complete 1 grooming task while semi perched in [place] to work on standing tolerance   Skilled Therapeutic Interventions/Progress Updates:    Pt greeted in w/c with RN present. Reporting 10/10 pain in neck/Lt shoulder and requesting for seated tx today. Per RN, he had just been medicated. RN and OT encouraged sitting on BSC to attempt voiding due to MD order (it had been 30 minutes since eating breakfast). Pt refused despite education. ADL needs met. Therefore pt was escorted off unit via w/c. Tx focus placed on trunk and UE strengthening in stimulating community setting. He self propelled w/c in gift shop, focusing on w/c navigation in tight spaces and around obstacles. With vcs for locking brakes, pt sat unsupported several times to look at clothing items or lift items off of hooks. Pt also able to reach to floor to retrieve 1 dropped blanket. In atrium, had pt engage in anterior and lateral reaches to targets while sitting unsupported. Longest window of time he could engage in activity without backrest support increased from 40 sec to 1 minute 30 seconds. W/c pushups completed for 10 second holds x15 reps with instruction on breathing. Throughout tx, to ease up shoulder pain guided pt through gentle stretches including forward/backwards shoulder rolls, scapular pinches, shoulder shrugs, and modified pendulums. At end of session pt was returned to room and left in w/c with all needs within reach. Per consultation with RN and primary PT, safety belt  d/c today.   Also discussed weight loss strategies to implement post d/c for health improvement. He reported having multiple pieces of exercise equipment in the home. He and spouse have also started planning to prepare healthier meals. Encouraged pt to look into [employer] programs for social participation and for making exercise part of his daily routine.   Therapy Documentation Precautions:  Precautions Precautions: Cervical, Fall Precaution Comments: reviewed cervical precautions with patient, min cueing to recall   Required Braces or Orthoses: Cervical Brace Cervical Brace: Soft collar Restrictions Weight Bearing Restrictions: No Vital Signs:  Pain Assessment Pain Scale: 0-10 Pain Score: 10-Worst pain ever Pain Type: Acute pain Pain Location: Neck Pain Orientation: Posterior Pain Descriptors / Indicators: Aching Pain Frequency: Constant Pain Onset: On-going Pain Intervention(s): Medication (See eMAR) ADL: ADL Grooming: Minimal assistance Where Assessed-Grooming: Sitting at sink Upper Body Bathing: Minimal assistance Where Assessed-Upper Body Bathing: Sitting at sink Lower Body Bathing: Other (comment), Dependent(2 assist) Where Assessed-Lower Body Bathing: Wheelchair, Standing at sink, Sitting at sink(using [employer]) Upper Body Dressing: Moderate assistance Where Assessed-Upper Body Dressing: Sitting at sink Lower Body Dressing: Other (Comment), Dependent(2 assist) Where Assessed-Lower Body Dressing: Wheelchair, Standing at sink, Sitting at sink(using Jeralene Huff) Toileting: Not assessed Toilet Transfer: Not assessed Tub/Shower Transfer: Not assessed      Therapy/Group: Individual Therapy  Letitia Sabala A Christie Copley 12/14/2018, 12:31 PM

## 2018-12-14 NOTE — Progress Notes (Signed)
Patient      Refused to be toileted after breakfast,refused     Potassium.

## 2018-12-14 NOTE — Discharge Summary (Signed)
Physician Discharge Summary  Patient ID: [name] [name] MRN: [id number] DOB/AGE: [birth year] 63 y.o.  Admit date: 12/07/2018 Discharge date: 12/14/2018  Admission Diagnoses:  Discharge Diagnoses:  Active Problems:   Cervical myelopathy [facility])   Discharged Condition: good  Hospital Course: Patient admitted for treatment of a severe compressive myelopathy with bilateral quadriparesis.  Patient underwent uncomplicated 3 level anterior cervical decompression and fusion.  Postop Lee the patient has had dramatic return and function both in his upper and lower extremities.  He is progressing with therapy and inpatient rehabilitation has agreed that he is a good candidate for inpatient rehabilitation for further functional gains.  Consults:   Significant Diagnostic Studies:   Treatments:   Discharge Exam: Blood pressure (!) 126/93, pulse 78, temperature 97.7 F (36.5 C), temperature source Oral, resp. rate 16, height 5\' 10"  (1.778 m), weight (!) 138.3 kg, SpO2 98 %. Patient is awake and alert.  He is oriented and appropriate.  Speech is fluent.  Judgment and insight are intact.  Motor examination extremities with 3/5 strength in his left deltoid.  4-/5 strength of his left biceps.  Left wrist extensors and left triceps muscle 4/5.  Grips 4+/5 bilaterally.  Lower extremity strength 3/5 bilaterally.  Wound clean and dry.  Chest and abdomen benign. Disposition:    Allergies as of 12/10/2018      Reactions   Unasyn [ampicillin-sulbactam Sodium] Hives, Itching, Swelling, Rash      Medication List    ASK your doctor about these medications   celecoxib 200 MG capsule Commonly known as:  CELEBREX Take 1 capsule (200 mg total) by mouth daily.   diclofenac sodium 1 % Gel Commonly known as:  VOLTAREN Apply 4 g topically 4 (four) times daily.   DULoxetine 60 MG capsule Commonly known as:  CYMBALTA Take 1 capsule (60 mg total) by mouth daily.   ferrous gluconate 324 MG tablet Commonly known  as:  FERGON Take 1 tablet (324 mg total) by mouth 3 (three) times daily with meals.   HYDROcodone-acetaminophen 7.5-325 MG tablet Commonly known as:  NORCO Take 1 tablet by mouth every 8 (eight) hours as needed for moderate pain.   pantoprazole 40 MG tablet Commonly known as:  PROTONIX Take 1 tablet (40 mg total) by mouth 2 (two) times daily before a meal.   tamsulosin 0.4 MG Caps capsule Commonly known as:  FLOMAX Take 1 capsule (0.4 mg total) by mouth daily.        Signed: Kathaleen Maser Clif Serio 12/14/2018, 11:25 PM

## 2018-12-14 NOTE — Progress Notes (Signed)
[place] PHYSICAL MEDICINE & REHABILITATION PROGRESS NOTE   Subjective/Complaints:  No new issues. Has ongoing right shoulder pain and asked if I could inject his shoulder this week  ROS: Patient denies fever, rash, sore throat, blurred vision, nausea, vomiting, diarrhea, cough, shortness of breath or chest pain,   headache, or mood change.    Objective:   No results found. No results for input(s): WBC, HGB, HCT, PLT in the last 72 hours. No results for input(s): NA, K, CL, CO2, GLUCOSE, BUN, CREATININE, CALCIUM in the last 72 hours.  Intake/Output Summary (Last 24 hours) at 12/14/2018 0913 Last data filed at 12/14/2018 0528 Gross per 24 hour  Intake 473 ml  Output 1425 ml  Net -952 ml     Physical Exam: Vital Signs Blood pressure (!) 144/99, pulse 75, temperature 98.1 F (36.7 C), resp. rate 18, height 5\' 10"  (1.778 m), weight (!) 141.4 kg, SpO2 100 %.   Constitutional: No distress . Vital signs reviewed. obese HEENT: EOMI, oral membranes moist Neck: supple Cardiovascular: RRR without murmur. No JVD    Respiratory: CTA Bilaterally without wheezes or rales. Normal effort    GI: BS +, non-tender, non-distended  Extremities: No clubbing, cyanosis, or edema Skin: No evidence of breakdown, no evidence of rash Neurologic: Cranial nerves II through XII intact, motor strength is 5/5 in bilateral deltoid, bicep, tricep, grip, hip flexor, 4/5 knee extensors, tr R and 4- Left ankle dorsiflexor and plantar flexor   Musculoskeletal: left shoulder with AROM/PROM, rotator cuff impingement manuevers, subacromial pain   Assessment/Plan: 1. Functional deficits secondary to cervical myelopathy which require 3+ hours per day of interdisciplinary therapy in a comprehensive inpatient rehab setting.  Physiatrist is providing close team supervision and 24 hour management of active medical problems listed below.  Physiatrist and rehab team continue to assess barriers to discharge/monitor  patient progress toward functional and medical goals  Care Tool:  Bathing    Body parts bathed by patient: Chest, Abdomen, Left upper leg, Right upper leg, Face   Body parts bathed by helper: Left arm, Right arm, Front perineal area, Buttocks, Right lower leg, Left lower leg     Bathing assist Assist Level: Maximal Assistance - Patient 24 - 49%     Upper Body Dressing/Undressing Upper body dressing   What is the patient wearing?: Pull over shirt    Upper body assist Assist Level: Minimal Assistance - Patient > 75%    Lower Body Dressing/Undressing Lower body dressing      What is the patient wearing?: Incontinence brief, Pants     Lower body assist Assist for lower body dressing: 2 Helpers     Toileting Toileting    Toileting assist Assist for toileting: 2 Helpers     Transfers Chair/bed transfer  Transfers assist  Chair/bed transfer activity did not occur: Safety/medical concerns  Chair/bed transfer assist level: Dependent - mechanical lift(stedy)     Locomotion Ambulation   Ambulation assist   Ambulation activity did not occur: Safety/medical concerns          Walk 10 feet activity   Assist  Walk 10 feet activity did not occur: Safety/medical concerns        Walk 50 feet activity   Assist Walk 50 feet with 2 turns activity did not occur: Safety/medical concerns         Walk 150 feet activity   Assist Walk 150 feet activity did not occur: Safety/medical concerns  Walk 10 feet on uneven surface  activity   Assist Walk 10 feet on uneven surfaces activity did not occur: Safety/medical concerns         Wheelchair     Assist Will patient use wheelchair at discharge?: Yes Type of Wheelchair: Manual Wheelchair activity did not occur: Safety/medical concerns  Wheelchair assist level: Supervision/Verbal cueing Max wheelchair distance: 150'    Wheelchair 50 feet with 2 turns activity    Assist     Wheelchair 50 feet with 2 turns activity did not occur: Safety/medical concerns   Assist Level: Supervision/Verbal cueing   Wheelchair 150 feet activity     Assist Wheelchair 150 feet activity did not occur: Safety/medical concerns   Assist Level: Supervision/Verbal cueing    Medical Problem List and Plan: 1. tetraparesis secondary to cervical myelopathy s/c C3-6 ACDF CIR PT, OT 2. Antithrombotics: -DVT/anticoagulation:Mechanical:Antiembolism stockings, knee (TED hose) Bilateral lower extremities Sequential compression devices, below kneeBilateral lower extremities -antiplatelet therapy: N/A 3. Pain Management:On Cymbalta daily. Continue hydrocodone prn  -will inject left shoulder this week 4. Mood:LCSW to follow for evaluation and support. -antipsychotic agents: N/A 5. Neuropsych: This patientiscapable of making decisions on hisown behalf. 6. Skin/Wound Care:Routine pressure relief measures. 7. Fluids/Electrolytes/Nutrition:Monitor I/O. 2/28 lytes.Hypo K, BUN/Cr normal KCL TID , recheck today is pending  -pt would like to come off kdur as it causes him to have diarrhea/GI upset 8. Urinary retention/BPH: Question plans for TURP. Continue Flomax.  Had Foley changed last week  - consider voiding trial soon---need to check with urology 9. Anemia of chronic disease: On iron for supplement. 10. Leucopenia: resolved. Recheck WBC normal 6.1 K on 2/28 11. Morbid obesity: Encourage appropriate diet and weight loss to help promote mobility and health.    LOS: 4 days A FACE TO FACE EVALUATION WAS PERFORMED  Ranelle Oyster 12/14/2018, 9:13 AM

## 2018-12-15 ENCOUNTER — Inpatient Hospital Stay (HOSPITAL_COMMUNITY): Payer: No Typology Code available for payment source

## 2018-12-15 ENCOUNTER — Inpatient Hospital Stay (HOSPITAL_COMMUNITY): Payer: No Typology Code available for payment source | Admitting: Occupational Therapy

## 2018-12-15 ENCOUNTER — Inpatient Hospital Stay (HOSPITAL_COMMUNITY): Payer: No Typology Code available for payment source | Admitting: Physical Therapy

## 2018-12-15 LAB — BASIC METABOLIC PANEL
ANION GAP: 4 — AB (ref 5–15)
BUN: 10 mg/dL (ref 8–23)
CO2: 31 mmol/L (ref 22–32)
Calcium: 8.8 mg/dL — ABNORMAL LOW (ref 8.9–10.3)
Chloride: 104 mmol/L (ref 98–111)
Creatinine, Ser: 0.77 mg/dL (ref 0.61–1.24)
GFR calc Af Amer: >60 mL/min (ref >60)
GFR calc non Af Amer: >60 mL/min (ref >60)
GLUCOSE: 92 mg/dL (ref 70–99)
Potassium: 3.2 mmol/L — ABNORMAL LOW (ref 3.5–5.1)
Sodium: 139 mmol/L (ref 135–145)

## 2018-12-15 LAB — CBC
HCT: 31.3 % — ABNORMAL LOW (ref 39.0–52.0)
Hemoglobin: 9.5 g/dL — ABNORMAL LOW (ref 13.0–17.0)
MCH: 24 pg — ABNORMAL LOW (ref 26.0–34.0)
MCHC: 30.4 g/dL (ref 30.0–36.0)
MCV: 79 fL — ABNORMAL LOW (ref 80.0–100.0)
Platelets: 243 10*3/uL (ref 150–400)
RBC: 3.96 MIL/uL — ABNORMAL LOW (ref 4.22–5.81)
RDW: 16 % — ABNORMAL HIGH (ref 11.5–15.5)
WBC: 3.7 10*3/uL — ABNORMAL LOW (ref 4.0–10.5)
nRBC: 0 % (ref 0.0–0.2)

## 2018-12-15 MED ORDER — METHOCARBAMOL 500 MG PO TABS
500.0000 mg | ORAL_TABLET | Freq: Four times a day (QID) | ORAL | Status: DC | PRN
  Administered 2018-12-15 – 2018-12-27 (×14): 500 mg via ORAL
  Filled 2018-12-15 (×14): qty 1

## 2018-12-15 MED ORDER — LIDOCAINE 1% INJECTION FOR CIRCUMCISION
5.0000 mL | INJECTION | Freq: Once | INTRAVENOUS | Status: DC
  Filled 2018-12-15 (×2): qty 5

## 2018-12-15 MED ORDER — POTASSIUM CHLORIDE CRYS ER 20 MEQ PO TBCR
40.0000 meq | EXTENDED_RELEASE_TABLET | Freq: Two times a day (BID) | ORAL | Status: DC
  Administered 2018-12-15 – 2018-12-18 (×3): 40 meq via ORAL
  Filled 2018-12-15 (×5): qty 2

## 2018-12-15 MED ORDER — TRIAMCINOLONE ACETONIDE 40 MG/ML IJ SUSP
40.0000 mg | Freq: Once | INTRAMUSCULAR | Status: DC
  Filled 2018-12-15 (×2): qty 1

## 2018-12-15 NOTE — Plan of Care (Signed)
  Problem: Consults Goal: RH SPINAL CORD INJURY PATIENT EDUCATION Description  See Patient Education module for education specifics.  Outcome: Progressing Goal: RH SPINAL CORD INJURY PATIENT EDUCATION Description  See Patient Education module for education specifics.  Outcome: Progressing   Problem: Consults Goal: RH GENERAL PATIENT EDUCATION Description See Patient Education module for education specifics. Outcome: Progressing Goal: Skin Care Protocol Initiated - if Braden Score 18 or less Description If consults are not indicated, leave blank or document N/A Outcome: Progressing   Problem: RH BOWEL ELIMINATION Goal: RH STG MANAGE BOWEL WITH ASSISTANCE Description STG Manage Bowel with mod Assistance.   Outcome: Progressing   Problem: RH SKIN INTEGRITY Goal: RH STG SKIN FREE OF INFECTION/BREAKDOWN Description With mod assist   Outcome: Progressing Goal: RH STG MAINTAIN SKIN INTEGRITY WITH ASSISTANCE Description STG Maintain Skin Integrity With mod Assistance.   Outcome: Progressing Goal: RH STG ABLE TO PERFORM INCISION/WOUND CARE W/ASSISTANCE Description STG Able To Perform Incision/Wound Care With mod Assistance.  Outcome: Progressing   Problem: RH SAFETY Goal: RH STG ADHERE TO SAFETY PRECAUTIONS W/ASSISTANCE/DEVICE Description STG Adhere to Safety Precautions With Assistance/Device. Min assist  Outcome: Progressing   Problem: RH PAIN MANAGEMENT Goal: RH STG PAIN MANAGED AT OR BELOW PT'S PAIN GOAL Description Pain scale <4/10  Outcome: Progressing   Problem: RH KNOWLEDGE DEFICIT SCI Goal: RH STG INCREASE KNOWLEDGE OF SELF CARE AFTER SCI Outcome: Progressing   

## 2018-12-15 NOTE — Patient Care Conference (Signed)
Inpatient RehabilitationTeam Conference and Plan of Care Update Date: 12/15/2018   Time: 2:20 PM    Patient Name: [name] [name]      Medical Record Number: [id number]  Date of Birth: 1956/10/14 Sex: Male         Room/Bed: 4W12C/4W12C-01 Payor Info: Payor: Advertising copywriter / Plan: GEHA / Product Type: *No Product type* /    Admitting Diagnosis: cervical fusion  Admit Date/Time:  12/10/2018  5:52 PM Admission Comments: No comment available   Primary Diagnosis:  <principal problem not specified> Principal Problem: <principal problem not specified>  Patient Active Problem List   Diagnosis Date Noted  . Radiculopathy 12/10/2018  . Cervical myelopathy (HCC) 12/07/2018  . Microcytic hypochromic anemia 08/27/2018  . At risk for adverse drug event 08/10/2018  . Chronic pain syndrome   . Labile blood pressure   . Urinary retention   . Hypokalemia   . Hypertension   . Acute blood loss anemia   . Anemia of chronic disease   . Abscess in epidural space of lumbar spine   . Effusion, left knee   . Pain and swelling of wrist, left   . Complete tear of left rotator cuff 07/03/2018    Class: Chronic  . MSSA bacteremia   . Septic arthritis of knee, left (HCC) 06/30/2018    Class: Acute  . Leukocytosis   . Postoperative seroma involving nervous system after nervous system procedure   . Shoulder pain, bilateral   . Thoracic myelopathy 06/28/2018  . Spondylosis, thoracic, with myelopathy 06/28/2018    Class: Acute  . Abscess in epidural space of L2-L5 lumbar spine 06/28/2018    Class: Acute  . Acute urinary retention 06/28/2018  . Left cervical radiculopathy 06/28/2018    Class: Acute  . Spinal stenosis of lumbar region 11/17/2017    Class: Chronic  . Status post lumbar laminectomy 11/17/2017  . Lumbar stenosis with neurogenic claudication 08/18/2017  . Forestier's disease of thoracolumbar region 08/18/2017  . Degenerative disc disease, lumbar 08/18/2017  . Lumbar radiculopathy  09/30/2016  . Morbid (severe) obesity due to excess calories (HCC) 09/30/2016  . Biliary calculus with acute cholecystitis 03/21/2016  . Acute pyelonephritis 12/28/2013  . Ureteral stone with hydronephrosis 12/28/2013  . Acute kidney failure (HCC) 12/27/2013  . Severe sepsis with acute organ dysfunction (HCC) 12/27/2013  . Hypotension 12/27/2013  . Lactic acidosis 12/27/2013  . Sepsis secondary to UTI (HCC) 12/27/2013  . EMPYEMA CHEST 08/30/2008  . PRURIGO 08/30/2008  . OBSTRUCTIVE SLEEP APNEA 08/30/2008    Expected Discharge Date: Expected Discharge Date: 12/29/18  Team Members Present: Physician leading conference: Dr. Faith Rogue Social Worker Present: Staci Acosta, LCSW Nurse Present: Otilio Carpen, LPN PT Present: Other (comment)(Taylor Serita Grit, PT) OT Present: Towanda Malkin, OT SLP Present: Colin Benton, SLP PPS Coordinator present : Fae Pippin     Current Status/Progress Goal Weekly Team Focus  Medical   pt with cervical myelopathy s/p cervical. has chronic weakness, pain, neurogenic bladder and bowel  maximize his ability to participate in therapy  pain control, voiding trial?, bowel program   Bowel/Bladder   continent of bowel foley catheter urology to reassess  LBM 03/02  remain continent of bowel normal bowel pattern foley care  assist with toileting prn foley care 0500 1700   Swallow/Nutrition/ Hydration             ADL's   Min A UB dressing, 2 assist LB dressing/dressing/toileting. 2 assist for toilet transfers using Geralyn Corwin.   Min  A UB self care, Mod A LB self care. Min A functional transfers + Max A toileting   General strengthening, functional transfers, sit<stands, pt/family education    Mobility   mod A bed mobility, max A sit to stand, min A slide board transfer, S w/c mobility  min A overall at w/c level, pt goal to perform stand pivot transfer with RW  standing tolerance, transfers, BLE strength   Communication             Safety/Cognition/  Behavioral Observations            Pain   neck pain occasional generalzied pain controlled with Norco and tylenol prn  pain <3/10  assess pain qshift and prn medicate as directed   Skin   steri strips to surgical incision anterior neck   pt remains free of infection or skin breakdown   assess skin qshift and prn    Rehab Goals Patient on target to meet rehab goals: Yes Rehab Goals Revised: none *See Care Plan and progress notes for long and short-term goals.     Barriers to Discharge  Current Status/Progress Possible Resolutions Date Resolved   Physician    Medical stability;Wound Care  pain mgt     left shoulder injection, continued mobilization with therapies      Nursing                  PT                    OT Medical stability;Incontinence;Neurogenic Bowel & Bladder;Weight                SLP                SW                Discharge Planning/Teaching Needs:  Pt plans to return home with wife to provide assistance.  Pt also had HH therapies coming in to work with him and he would like to resume these.  Family education closer to d/c.   Team Discussion:  Pt with back surgery for cervical decompression, weakness, and neck/shoulder pain.  Dr. Riley Kill will inject pt's shoulder to see if that helps with his pain.  Pt still has pain with medications ordered and Dr. Riley Kill suggested using the muscle relaxer, as well.  Pt is max A with bed mobility and sit to stand with steady.  Pt is mod A with slide board.  Pt's goal is to do stand pivot txs with rolling walker.  Pt is max A for lower body dressing and min A upper body.  He is in better condition than he was when on CIR in September 2019.  Revisions to Treatment Plan:  none    Continued Need for Acute Rehabilitation Level of Care: The patient requires daily medical management by a physician with specialized training in physical medicine and rehabilitation for the following conditions: Daily direction of a multidisciplinary  physical rehabilitation program to ensure safe treatment while eliciting the highest outcome that is of practical value to the patient.: Yes Daily medical management of patient stability for increased activity during participation in an intensive rehabilitation regime.: Yes Daily analysis of laboratory values and/or radiology reports with any subsequent need for medication adjustment of medical intervention for : Post surgical problems;Wound care problems;Neurological problems;Urological problems   I attest that I was present, lead the team conference, and concur with the assessment and plan of the team.  Aicia Babinski, [place] 12/15/2018, 2:51 PM

## 2018-12-15 NOTE — Progress Notes (Signed)
Pt was given hydrocodone for pain with relief. Foley is patent with clear yellow urine. Pt is currently resting and appears comfortable. 2 SR up and call light within reach.

## 2018-12-15 NOTE — Progress Notes (Signed)
[place] PHYSICAL MEDICINE & REHABILITATION PROGRESS NOTE   Subjective/Complaints:  Had a lot of neck and upper back spasms last night. Nothing seemed to help pain. Left shoulder still sore  ROS: Patient denies fever, rash, sore throat, blurred vision, nausea, vomiting, diarrhea, cough, shortness of breath or chest pain,   headache, or mood change.   Objective:   No results found. Recent Labs    12/15/18 0545  WBC 3.7*  HGB 9.5*  HCT 31.3*  PLT 243   Recent Labs    12/15/18 0545  NA 139  K 3.2*  CL 104  CO2 31  GLUCOSE 92  BUN 10  CREATININE 0.77  CALCIUM 8.8*    Intake/Output Summary (Last 24 hours) at 12/15/2018 0846 Last data filed at 12/14/2018 1900 Gross per 24 hour  Intake 480 ml  Output 700 ml  Net -220 ml     Physical Exam: Vital Signs Blood pressure (!) 155/96, pulse 67, temperature 98.3 F (36.8 C), resp. rate 18, height 5\' 10"  (1.778 m), weight (!) 141.4 kg, SpO2 99 %.   Constitutional: No distress . Vital signs reviewed. HEENT: EOMI, oral membranes moist Neck: supple Cardiovascular: RRR without murmur. No JVD    Respiratory: CTA Bilaterally without wheezes or rales. Normal effort    GI: BS +, non-tender, non-distended   Extremities: No clubbing, cyanosis, or edema Skin: No evidence of breakdown, no evidence of rash Neurologic: Cranial nerves II through XII intact, motor strength is 5/5 in bilateral deltoid, bicep, tricep, grip, hip flexor, 4/5 knee extensors, tr R and 4- Left ankle dorsiflexor and plantar flexor   Musculoskeletal: left shoulder with AROM/PROM, rotator cuff impingement manuevers, subacromial pain--persistent, neck and shoulder girdle muscles are tight   Assessment/Plan: 1. Functional deficits secondary to cervical myelopathy which require 3+ hours per day of interdisciplinary therapy in a comprehensive inpatient rehab setting.  Physiatrist is providing close team supervision and 24 hour management of active medical problems  listed below.  Physiatrist and rehab team continue to assess barriers to discharge/monitor patient progress toward functional and medical goals  Care Tool:  Bathing  Bathing activity did not occur: Refused Body parts bathed by patient: Chest, Abdomen, Left upper leg, Right upper leg, Face   Body parts bathed by helper: Left arm, Right arm, Front perineal area, Buttocks, Right lower leg, Left lower leg     Bathing assist Assist Level: Maximal Assistance - Patient 24 - 49%     Upper Body Dressing/Undressing Upper body dressing   What is the patient wearing?: Pull over shirt    Upper body assist Assist Level: Minimal Assistance - Patient > 75%    Lower Body Dressing/Undressing Lower body dressing      What is the patient wearing?: Incontinence brief, Pants     Lower body assist Assist for lower body dressing: 2 Helpers     Toileting Toileting    Toileting assist Assist for toileting: 2 Helpers     Transfers Chair/bed transfer  Transfers assist  Chair/bed transfer activity did not occur: Safety/medical concerns  Chair/bed transfer assist level: Minimal Assistance - Patient > 75%(slide board)     Locomotion Ambulation   Ambulation assist   Ambulation activity did not occur: Safety/medical concerns          Walk 10 feet activity   Assist  Walk 10 feet activity did not occur: Safety/medical concerns        Walk 50 feet activity   Assist Walk 50 feet with 2  turns activity did not occur: Safety/medical concerns         Walk 150 feet activity   Assist Walk 150 feet activity did not occur: Safety/medical concerns         Walk 10 feet on uneven surface  activity   Assist Walk 10 feet on uneven surfaces activity did not occur: Safety/medical concerns         Wheelchair     Assist Will patient use wheelchair at discharge?: Yes Type of Wheelchair: Manual Wheelchair activity did not occur: Safety/medical concerns  Wheelchair  assist level: Supervision/Verbal cueing Max wheelchair distance: 70'    Wheelchair 50 feet with 2 turns activity    Assist    Wheelchair 50 feet with 2 turns activity did not occur: Safety/medical concerns   Assist Level: Supervision/Verbal cueing   Wheelchair 150 feet activity     Assist Wheelchair 150 feet activity did not occur: Safety/medical concerns   Assist Level: Supervision/Verbal cueing    Medical Problem List and Plan: 1. tetraparesis secondary to cervical myelopathy s/c C3-6 ACDF   --Interdisciplinary Team Conference today   2. Antithrombotics: -DVT/anticoagulation:Mechanical:Antiembolism stockings, knee (TED hose) Bilateral lower extremities Sequential compression devices, below kneeBilateral lower extremities -antiplatelet therapy: N/A 3. Pain Management:On Cymbalta daily. Continue hydrocodone prn  -will inject left shoulder today 4. Mood:LCSW to follow for evaluation and support. -antipsychotic agents: N/A 5. Neuropsych: This patientiscapable of making decisions on hisown behalf. 6. Skin/Wound Care:Routine pressure relief measures. 7. Fluids/Electrolytes/Nutrition:Monitor I/O. 2/28 lytes.Hypo K, BUN/Cr normal  ---pt would like to come off kdur as it causes him to have diarrhea/GI upset, but will need to continue given on going hypokalemia. 3.2 3/3  -change to bid at least for a few days 8. Urinary retention/BPH: Question plans for TURP. Continue Flomax.  Had Foley changed last week  - consider voiding trial soon---urology to see 9. Anemia of chronic disease: On iron for supplement. 10. Leucopenia: resolved. Recheck WBC normal 3.7 3/3 11. Morbid obesity: Encourage appropriate diet and weight loss to help promote mobility and health.    LOS: 5 days A FACE TO FACE EVALUATION WAS PERFORMED  Ranelle Oyster 12/15/2018, 8:46 AM

## 2018-12-15 NOTE — Progress Notes (Signed)
Physical Therapy Session Note  Patient Details  Name: [name] [name] MRN: [id number] Date of Birth: 04/18/1956  Today's Date: 12/15/2018 PT Individual Time: 1100-1200 PT Individual Time Calculation (min): 60 min   Short Term Goals: Week 1:  PT Short Term Goal 1 (Week 1): Pt will complete rolling with mod A consistently PT Short Term Goal 2 (Week 1): Pt will complete least restrictive transfer with max A PT Short Term Goal 3 (Week 1): Pt will initiate w/c mobility  Skilled Therapeutic Interventions/Progress Updates:    Pt received seated in w/c in room, agreeable to PT session. Pt reports 7/10 pain in neck and L upper back/shoulder region. Performed STM and PROM stretches to L trapezius with pt reporting some relief of pain. Provided hot pack to upper back region at end of session with pt reporting decrease in pain. Slide board transfer w/c to/from mat table x 4 reps with min A for balance, some assist to reposition RLE during transfer. Pt requires increased time to complete transfer and min cueing for head/hips relationship. Pt declines to perform any standing this date due to L shoulder pain. Seated BLE therex 2 x 10 reps each with yellow theraband: marches, LAQ, hip add squeeze, hip abd, HS curls. Manual w/c propulsion x 100 ft with use of BUE before onset of fatigue, Supervision. Pt left seated in w/c in room with needs in reach at end of therapy session.  Therapy Documentation Precautions:  Precautions Precautions: Cervical, Fall Precaution Comments: reviewed cervical precautions with patient, min cueing to recall   Required Braces or Orthoses: Cervical Brace Cervical Brace: Soft collar Restrictions Weight Bearing Restrictions: No    Therapy/Group: Individual Therapy   Peter Congo, PT, DPT  12/15/2018, 12:15 PM

## 2018-12-15 NOTE — Progress Notes (Signed)
Physical Therapy Session Note  Patient Details  Name: [name] [name] MRN: [id number] Date of Birth: Jan 11, 1956  Today's Date: 12/15/2018  Short Term Goals: Week 1:  PT Short Term Goal 1 (Week 1): Pt will complete rolling with mod A consistently PT Short Term Goal 2 (Week 1): Pt will complete least restrictive transfer with max A PT Short Term Goal 3 (Week 1): Pt will initiate w/c mobility  Pt missed 75 min of skilled PT due to pain. Offered patient various treatment options and pt continued to decline. All needs in reach and will follow up as able.   Therapy Documentation Precautions:  Precautions Precautions: Cervical, Fall Precaution Comments: reviewed cervical precautions with patient, min cueing to recall   Required Braces or Orthoses: Cervical Brace Cervical Brace: Soft collar Restrictions Weight Bearing Restrictions: No General: PT Amount of Missed Time (min): 75 Minutes PT Missed Treatment Reason: Pain;Patient unwilling to participate Pain: 7/10 pain in upper shoulder and neck. Premedicated and using heating pad.  12/15/2018, 1:21 PM

## 2018-12-15 NOTE — Progress Notes (Signed)
Occupational Therapy Session Note  Patient Details  Name: [name] [name] MRN: [id number] Date of Birth: [birth year]  Today's Date: 12/15/2018 OT Individual Time: 1102-1117 OT Individual Time Calculation (min): 60 min    Short Term Goals: Week 1:  OT Short Term Goal 1 (Week 1): Pt will complete 1/3 components of donning pants using AE as needed  OT Short Term Goal 2 (Week 1): Pt will complete bathing sit<stand with 1 assist  OT Short Term Goal 3 (Week 1): Pt will complete 1 grooming task while semi perched in Stedy to work on standing tolerance   Skilled Therapeutic Interventions/Progress Updates:    Patient in bed upon arrival, dependent to clean up after bowel movement.  Completed LB bathing and dressing at bed level.  Patient able to bathe upper legs and assist with CM rolling side to side in bed.  Supine to SSP with mod A.  Sit to  Stand in stedy with bed height elevated min A.  Completed grooming, UB bathing and dressing at w/c level - min A for OH shirt otherwise set up assist to complete.   Completed UB conditioning activities with ergometer 2 x 5 minutes ( no pain at left shoulder during this task) Patient returned to room, remained seated in w/c with call bell and tray table in reach.  Therapy Documentation Precautions:  Precautions Precautions: Cervical, Fall Precaution Comments: reviewed cervical precautions with patient, min cueing to recall   Required Braces or Orthoses: Cervical Brace Cervical Brace: Soft collar Restrictions Weight Bearing Restrictions: No General:   Vital Signs:  Pain: Pain Assessment Pain Scale: 0-10 Pain Score: 1  Pain Type: Acute pain Pain Location: Neck Pain Orientation: Posterior Pain Descriptors / Indicators: Aching Pain Frequency: Intermittent Pain Onset: On-going Pain Intervention(s): Medication (See eMAR)(norco)   Other Treatments:     Therapy/Group: Individual Therapy  Barrie Lyme 12/15/2018, 12:10 PM

## 2018-12-16 ENCOUNTER — Inpatient Hospital Stay (HOSPITAL_COMMUNITY): Payer: No Typology Code available for payment source

## 2018-12-16 ENCOUNTER — Inpatient Hospital Stay (HOSPITAL_COMMUNITY): Payer: No Typology Code available for payment source | Admitting: Physical Therapy

## 2018-12-16 MED ORDER — LIDOCAINE HCL 1 % IJ SOLN
10.0000 mL | Freq: Once | INTRAMUSCULAR | Status: AC
  Administered 2018-12-16: 10 mL via INTRADERMAL
  Filled 2018-12-16: qty 10

## 2018-12-16 NOTE — Progress Notes (Signed)
Occupational Therapy Session Note  Patient Details  Name: [name] [name] MRN: [id number] Date of Birth: Mar 14, 1956  Today's Date: 12/16/2018 OT Individual Time: 1130-1200 OT Individual Time Calculation (min): 30 min    Short Term Goals: Week 1:  OT Short Term Goal 1 (Week 1): Pt will complete 1/3 components of donning pants using AE as needed  OT Short Term Goal 2 (Week 1): Pt will complete bathing sit<stand with 1 assist  OT Short Term Goal 3 (Week 1): Pt will complete 1 grooming task while semi perched in Stedy to work on standing tolerance   Skilled Therapeutic Interventions/Progress Updates:    Late entry/note. Pt resting in w/c upon arrival and agreeable to therapy. OT intervention with focus on discharge planning, BUE AAROM/AROM and therex to increase BUE functional use and independence with BADLs. Pt remained in w/c with all needs within reach.  Therapy Documentation Precautions:  Precautions Precautions: Cervical, Fall Precaution Comments: reviewed cervical precautions with patient, min cueing to recall   Required Braces or Orthoses: Cervical Brace Cervical Brace: Soft collar Restrictions Weight Bearing Restrictions: No : Pain:  Pt c/o 4/10 pain in posterior neck (post surgical pain) Therapy/Group: Individual Therapy  [name] 12/16/2018, 12:08 PM

## 2018-12-16 NOTE — Progress Notes (Signed)
Physical Therapy Session Note  Patient Details  Name: [name] [name] MRN: [id number] Date of Birth: [birth year]  Today's Date: 12/16/2018 PT Individual Time: 1430-1525 PT Individual Time Calculation (min): 55 min   Short Term Goals: Week 1:  PT Short Term Goal 1 (Week 1): Pt will complete rolling with mod A consistently PT Short Term Goal 2 (Week 1): Pt will complete least restrictive transfer with max A PT Short Term Goal 3 (Week 1): Pt will initiate w/c mobility  Skilled Therapeutic Interventions/Progress Updates:    Patient received up in [facility], pleasant and willing to participate in session. Able to self-propel WC for improved B UE endurance and strength approximately 159f with S and verbal encouragement, then transported the rest of the way to PT gym with totalA for energy conservation. Focused on B LE strengthening and endurance with repeated sit to stands, MaxAx2 from height of WC due to fatigue this afternoon, but able to perform multiple stands from elevated height of stedy seat with ModAx2 fading to MinAx1/Min guard x1 from stedy seat and able to maintain standing for 35 seconds although continues to support himself quite a bit with UEs, VC provided to increase focus on LE strength and endurance this session. Finished with functional exercises for B LEs and UEs within cervical precautions. Patient fatigued at EOS and was returned [facility] left up in chair with all needs met and wife present.   Therapy Documentation Precautions:  Precautions Precautions: Cervical, Fall Precaution Comments: reviewed cervical precautions with patient, min cueing to recall   Required Braces or Orthoses: Cervical Brace Cervical Brace: Soft collar Restrictions Weight Bearing Restrictions: No General:   Pain: Pain Assessment Pain Scale: 0-10 Pain Score: 0-No pain     Therapy/Group: Individual Therapy  KDeniece ReePT, DPT, CBIS  Supplemental Physical Therap[facility]    Pager [phone] Rehab Office [phone]  12/16/2018, 3:41 PM

## 2018-12-16 NOTE — Progress Notes (Signed)
Occupational Therapy Session Note  Patient Details  Name: Idus Rathke MRN: [id number] Date of Birth: 1955-11-07  Today's Date: 12/16/2018 OT Individual Time: 7225-7505 OT Individual Time Calculation (min): 55 min    Short Term Goals: Week 1:  OT Short Term Goal 1 (Week 1): Pt will complete 1/3 components of donning pants using AE as needed  OT Short Term Goal 2 (Week 1): Pt will complete bathing sit<stand with 1 assist  OT Short Term Goal 3 (Week 1): Pt will complete 1 grooming task while semi perched in Stedy to work on standing tolerance   Skilled Therapeutic Interventions/Progress Updates:    1:1. Pt reporting 5/10 pain in neck, however declines intervention. Pt completes supine>sitting EOB with MOD A for trunkelevation. Pt completes sit to stand in stedy with min-mod A depending on height surface. Pt completes oral care and bathing perched on stedy flaps for trunk control with surgical site covered. Pt completes UB bathing with A for washing LUE d/t decreased shoulder AROM of LUE. Pt able to maintain dynamic sitting balnace on stedy flaps with S but states, "I feel it working my core." Pt completes LB dressing total A, but rolls with min-mod A for OT to advance pants. Pt completes UB dress with A for overhead. Demo use of reacher to thread shirt overhead to attempt tomorrow. Pt verbalizes understanding. Exited session with pt seated inw/c, call light in reach and all needs met  Therapy Documentation Precautions:  Precautions Precautions: Cervical, Fall Precaution Comments: reviewed cervical precautions with patient, min cueing to recall   Required Braces or Orthoses: Cervical Brace Cervical Brace: Soft collar Restrictions Weight Bearing Restrictions: No General:   Vital Signs:  Pain: Pain Assessment Pain Score: Asleep ADL: ADL Grooming: Minimal assistance Where Assessed-Grooming: Sitting at sink Upper Body Bathing: Minimal assistance Where Assessed-Upper Body Bathing:  Sitting at sink Lower Body Bathing: Other (comment), Dependent(2 assist) Where Assessed-Lower Body Bathing: Wheelchair, Standing at sink, Sitting at sink(using Jeralene Huff) Upper Body Dressing: Moderate assistance Where Assessed-Upper Body Dressing: Sitting at sink Lower Body Dressing: Other (Comment), Dependent(2 assist) Where Assessed-Lower Body Dressing: Wheelchair, Standing at sink, Sitting at sink(using Jeralene Huff) Toileting: Not assessed Toilet Transfer: Not assessed Tub/Shower Transfer: Not assessed Vision   Perception    Praxis   Exercises:   Other Treatments:     Therapy/Group: Individual Therapy  Tonny Branch 12/16/2018, 9:40 AM

## 2018-12-16 NOTE — Progress Notes (Signed)
Patient offered toileting after meals, declined. Educated about emptying bowels.

## 2018-12-16 NOTE — Progress Notes (Signed)
Pt refused suppository bowel program. Pt was educated as to the importance of the bowel program and the possible side effects of constipation. Pt still refuses states " I went several times yesterday I am good" . Pt's pain relieved with tylenol and Norco . Foley care done. Pt resting.

## 2018-12-16 NOTE — Progress Notes (Signed)
Social Work Patient ID: [name] [name], male   DOB: Feb 01, 1956, 63 y.o.   MRN: [id number]   CSW met with pt on 12-15-18 to update him on team conference discussion and pt's targeted d/c date of 12-29-18.  Pt is grateful he was able to come back to CIR and knows he will progress.  Pt had HH at home for therapies and CSW will resume this service at d/c. Pt has good family support at home.  CSW will continue to follow and assist as needed.

## 2018-12-16 NOTE — Progress Notes (Signed)
Physical Therapy Session Note  Patient Details  Name: [name] [name] MRN: [id number] Date of Birth: 05-29-1956  Today's Date: 12/16/2018 PT Individual Time: 1000-1100 PT Individual Time Calculation (min): 60 min   Short Term Goals: Week 1:  PT Short Term Goal 1 (Week 1): Pt will complete rolling with mod A consistently PT Short Term Goal 2 (Week 1): Pt will complete least restrictive transfer with max A PT Short Term Goal 3 (Week 1): Pt will initiate w/c mobility  Skilled Therapeutic Interventions/Progress Updates:    Pt received seated in w/c in room, agreeable to PT session. Pt reports pain is much improved since yesterday, no current complaints of pain. Manual w/c propulsion 1 x 50 ft, 1 x 75 ft with use of BUE and Supervision. Sit to stand in standing frame. Pt tolerates standing x 20 min with demonstration of good upright posture while performing card sorting then card game with use of BUE. Pt exhibits good tolerance for standing this date with improved posture from when he is standing in stedy with flexed trunk and hips. Pt left seated in w/c in room with needs in reach at end of session.  Therapy Documentation Precautions:  Precautions Precautions: Cervical, Fall Precaution Comments: reviewed cervical precautions with patient, min cueing to recall   Required Braces or Orthoses: Cervical Brace Cervical Brace: Soft collar Restrictions Weight Bearing Restrictions: No   Therapy/Group: Individual Therapy  Peter Congo, PT, DPT  12/16/2018, 12:24 PM

## 2018-12-16 NOTE — Progress Notes (Signed)
[place] PHYSICAL MEDICINE & REHABILITATION PROGRESS NOTE   Subjective/Complaints: States that he's working through neck/back pain. RN reports he refused bowel program this morning.   ROS: Patient denies fever, rash, sore throat, blurred vision, nausea, vomiting, diarrhea, cough, shortness of breath or chest pain, joint or back pain, headache, or mood change.   Objective:   No results found. Recent Labs    12/15/18 0545  WBC 3.7*  HGB 9.5*  HCT 31.3*  PLT 243   Recent Labs    12/15/18 0545  NA 139  K 3.2*  CL 104  CO2 31  GLUCOSE 92  BUN 10  CREATININE 0.77  CALCIUM 8.8*    Intake/Output Summary (Last 24 hours) at 12/16/2018 6389 Last data filed at 12/16/2018 0802 Gross per 24 hour  Intake 600 ml  Output 2250 ml  Net -1650 ml     Physical Exam: Vital Signs Blood pressure (!) 161/92, pulse 63, temperature (!) 97.5 F (36.4 C), temperature source Oral, resp. rate 18, height 5\' 10"  (1.778 m), weight (!) 141.4 kg, SpO2 100 %.   Constitutional: No distress . Vital signs reviewed. HEENT: EOMI, oral membranes moist Neck: supple Cardiovascular: RRR without murmur. No JVD    Respiratory: CTA Bilaterally without wheezes or rales. Normal effort    GI: BS +, non-tender, non-distended  Extremities: No clubbing, cyanosis, or edema Skin: No evidence of breakdown, no evidence of rash Neurologic: Cranial nerves II through XII intact, motor strength is 5/5 in bilateral deltoid, bicep, tricep, grip, hip flexor, 4/5 knee extensors, tr R and 4- Left ankle dorsiflexor and plantar flexor   Musculoskeletal: left shoulder with AROM/PROM, rotator cuff impingement manuevers-continued, subacromial pain    Assessment/Plan: 1. Functional deficits secondary to cervical myelopathy which require 3+ hours per day of interdisciplinary therapy in a comprehensive inpatient rehab setting.  Physiatrist is providing close team supervision and 24 hour management of active medical problems  listed below.  Physiatrist and rehab team continue to assess barriers to discharge/monitor patient progress toward functional and medical goals  Care Tool:  Bathing  Bathing activity did not occur: Refused Body parts bathed by patient: Chest, Abdomen, Left upper leg, Right upper leg, Face, Right arm, Left arm   Body parts bathed by helper: Front perineal area, Buttocks, Right lower leg, Left lower leg     Bathing assist Assist Level: Moderate Assistance - Patient 50 - 74%     Upper Body Dressing/Undressing Upper body dressing   What is the patient wearing?: Pull over shirt    Upper body assist Assist Level: Minimal Assistance - Patient > 75%    Lower Body Dressing/Undressing Lower body dressing      What is the patient wearing?: Incontinence brief, Pants     Lower body assist Assist for lower body dressing: Maximal Assistance - Patient 25 - 49%     Toileting Toileting    Toileting assist Assist for toileting: 2 Helpers     Transfers Chair/bed transfer  Transfers assist  Chair/bed transfer activity did not occur: Safety/medical concerns  Chair/bed transfer assist level: Minimal Assistance - Patient > 75%(slide board)     Locomotion Ambulation   Ambulation assist   Ambulation activity did not occur: Safety/medical concerns          Walk 10 feet activity   Assist  Walk 10 feet activity did not occur: Safety/medical concerns        Walk 50 feet activity   Assist Walk 50 feet with 2 turns  activity did not occur: Safety/medical concerns         Walk 150 feet activity   Assist Walk 150 feet activity did not occur: Safety/medical concerns         Walk 10 feet on uneven surface  activity   Assist Walk 10 feet on uneven surfaces activity did not occur: Safety/medical concerns         Wheelchair     Assist Will patient use wheelchair at discharge?: Yes Type of Wheelchair: Manual Wheelchair activity did not occur:  Safety/medical concerns  Wheelchair assist level: Supervision/Verbal cueing Max wheelchair distance: 100'    Wheelchair 50 feet with 2 turns activity    Assist    Wheelchair 50 feet with 2 turns activity did not occur: Safety/medical concerns   Assist Level: Supervision/Verbal cueing   Wheelchair 150 feet activity     Assist Wheelchair 150 feet activity did not occur: Safety/medical concerns   Assist Level: Supervision/Verbal cueing    Medical Problem List and Plan: 1. tetraparesis secondary to cervical myelopathy s/c C3-6 ACDF   -Continue CIR therapies including PT, OT   -continued education   2. Antithrombotics: -DVT/anticoagulation:Mechanical:Antiembolism stockings, knee (TED hose) Bilateral lower extremities Sequential compression devices, below kneeBilateral lower extremities -antiplatelet therapy: N/A 3. Pain Management:On Cymbalta daily. Continue hydrocodone prn  -After informed consent and preparation of the skin with betadine and isopropyl alcohol, I injected 6mg  (1cc) of celestone and 4cc of 1% lidocaine into the left subacromial space via lateral approach. Additionally, aspiration was performed prior to injection. The patient tolerated well, and no complications were encountered. Afterward the area was cleaned and dressed. Post- injection instructions were provided.   4. Mood:LCSW to follow for evaluation and support. -antipsychotic agents: N/A 5. Neuropsych: This patientiscapable of making decisions on hisown behalf. 6. Skin/Wound Care:Routine pressure relief measures. 7. Fluids/Electrolytes/Nutrition:Monitor I/O.   -continue hypokalemia. 3.2 3/3  -changed to bid---recheck BMET friday 8. Urinary retention/BPH: Question plans for TURP. Continue Flomax.  Had Foley changed 2 weeks ago  - consider voiding trial soon---urology to see during admit 9. Anemia of chronic disease: On iron for supplement. 10. Leucopenia:  resolved. Recheck WBC normal 3.7 3/3 11. Morbid obesity: Encourage appropriate diet and weight loss to help promote mobility and health. 12. Neurogenic bowel: continue patient education by team on trying to achieve a daily regimen    LOS: 6 days A FACE TO FACE EVALUATION WAS PERFORMED  Ranelle Oyster 12/16/2018, 9:07 AM

## 2018-12-17 ENCOUNTER — Inpatient Hospital Stay (HOSPITAL_COMMUNITY): Payer: No Typology Code available for payment source

## 2018-12-17 ENCOUNTER — Inpatient Hospital Stay (HOSPITAL_COMMUNITY): Payer: No Typology Code available for payment source | Admitting: Occupational Therapy

## 2018-12-17 NOTE — Plan of Care (Signed)
  Problem: Consults Goal: RH SPINAL CORD INJURY PATIENT EDUCATION Description  See Patient Education module for education specifics.  Outcome: Progressing Goal: RH SPINAL CORD INJURY PATIENT EDUCATION Description  See Patient Education module for education specifics.  Outcome: Progressing   Problem: Consults Goal: RH GENERAL PATIENT EDUCATION Description See Patient Education module for education specifics. Outcome: Progressing Goal: Skin Care Protocol Initiated - if Braden Score 18 or less Description If consults are not indicated, leave blank or document N/A Outcome: Progressing   Problem: RH BOWEL ELIMINATION Goal: RH STG MANAGE BOWEL WITH ASSISTANCE Description STG Manage Bowel with mod Assistance.   Outcome: Progressing   Problem: RH SKIN INTEGRITY Goal: RH STG SKIN FREE OF INFECTION/BREAKDOWN Description With mod assist   Outcome: Progressing Goal: RH STG MAINTAIN SKIN INTEGRITY WITH ASSISTANCE Description STG Maintain Skin Integrity With mod Assistance.   Outcome: Progressing Goal: RH STG ABLE TO PERFORM INCISION/WOUND CARE W/ASSISTANCE Description STG Able To Perform Incision/Wound Care With mod Assistance.  Outcome: Progressing   Problem: RH SAFETY Goal: RH STG ADHERE TO SAFETY PRECAUTIONS W/ASSISTANCE/DEVICE Description STG Adhere to Safety Precautions With Assistance/Device. Min assist  Outcome: Progressing   Problem: RH PAIN MANAGEMENT Goal: RH STG PAIN MANAGED AT OR BELOW PT'S PAIN GOAL Description Pain scale <4/10  Outcome: Progressing   Problem: RH KNOWLEDGE DEFICIT SCI Goal: RH STG INCREASE KNOWLEDGE OF SELF CARE AFTER SCI Outcome: Progressing   

## 2018-12-17 NOTE — Progress Notes (Signed)
Physical Therapy Session Note  Patient Details  Name: [name] [name] MRN: [id number] Date of Birth: 27-Sep-1956  Today's Date: 12/17/2018 PT Individual Time:Session1: 5852-7782; Session2: 1530-1600 PT Individual Time Calculation (min): 75 min   Short Term Goals: Week 1:  PT Short Term Goal 1 (Week 1): Pt will complete rolling with mod A consistently PT Short Term Goal 2 (Week 1): Pt will complete least restrictive transfer with max A PT Short Term Goal 3 (Week 1): Pt will initiate w/c mobility  Skilled Therapeutic Interventions/Progress Updates:    Session1: Patient in room in w/c and explained current PT activities.  Performed w/c mobility x 100' with 2 rest breaks due to L shoulder pain/fatigue. Performed slide board transfer to mat with mod A for board placement and increased time scooting hips with cues for anterior weight shift and keeping hips posterior for fall prevention.  Sit to supine min A through sidelying and performed therex in supine as noted below.  Also with powder board in L sidelying to work R LE for hip flex/extension and knee flex/ext through gait cycle.  Patient rolling and side to sit mod A.  Patient sit<>stand to stedy with mod to max A from elevated height mat x 4 reps.  Standing approx 20-30 seconds max.  Patient performed SBT back to w/c min A.  Assisted in w/c to dayroom and performed Kinetron as noted below from w/c level.  Assisted to room and left with all needs in reach.  Session2: Patient in supine and relates fatigue requesting to perform therapy in supine.  Performed LE stretching for hip rotators, hip extensors (1 & 2 joint) and heel cord stretching.  Noted some spasms in LE's during stretching.  Patient performed quad sets, glut sets with pillow squeeze between legs, scapular squeezes and head press all x 10 with 5 sec hold.  Patient left in supine with wife in room and call bell in reach.   Therapy Documentation Precautions:  Precautions Precautions: Cervical,  Fall Precaution Comments: reviewed cervical precautions with patient, min cueing to recall   Required Braces or Orthoses: Cervical Brace Cervical Brace: Soft collar Restrictions Weight Bearing Restrictions: No Pain: Pain Assessment Pain Score: 3  Pain Location: Neck Pain Orientation: Posterior Pain Descriptors / Indicators: Aching Pain Onset: On-going Pain Intervention(s): Repositioned;Ambulation/increased activity Exercises: Cardiovascular Exercises Kinetron: 3 x 2 minutes seated in w/c 30 cm/sec General Exercises - Lower Extremity Hip ABduction/ADduction: 10 reps;Strengthening;Sidelying(2 sets clamshell) Hip Flexion/Marching: 10 reps;Supine;Strengthening(2 sets marching hooklying) Repetitive Sit to Stands: Two upper extremities(4 reps to stedy) Other Exercises Other Exercises: trunk rotation 2 x 10 sets Other Exercises: bridging with assist 3 sec hold 2 x 10 reps Other Exercises: hip adductor ball squeezes 3 sec hold 2 x 10 reps Other Treatments:      Therapy/Group: Individual Therapy  Elray Mcgregor 12/17/2018, 10:13 AM

## 2018-12-17 NOTE — Progress Notes (Signed)
Occupational Therapy Session Note  Patient Details  Name: [name] [name] MRN: [id number] Date of Birth: 06-07-1956  Today's Date: 12/17/2018 OT Individual Time: 0800-0900 OT Individual Time Calculation (min): 60 min    Short Term Goals: Week 1:  OT Short Term Goal 1 (Week 1): Pt will complete 1/3 components of donning pants using AE as needed  OT Short Term Goal 2 (Week 1): Pt will complete bathing sit<stand with 1 assist  OT Short Term Goal 3 (Week 1): Pt will complete 1 grooming task while semi perched in Stedy to work on standing tolerance   Skilled Therapeutic Interventions/Progress Updates:    Upon entering the room, pt supine in bed with no c/o pain this session. Pt agreeable to OT intervention. Pt verbalizing, " I think I might need to be cleaned up." However, pt had not been incontinent. Pt rolling L <> R to don LB clothing with mod A for rolling. Supine >sit on EOB with min A for trunk elevation. Pt standing in bari STEDY this session with mod lifting assistance from elevated bed height. Pt transferred into wheelchair and he propelled self to sink. Grooming performed independently at sink from wheelchair height which is what pt does at home. Pt engaged ing UB bathing with assistance to wash B armpits due to limited mobility in shoulders and he was able to do the rest. UB dressing with assistance to get over head and fully pull down trunk. OT provided total A to don B TEDs and shoes. Pt remained in wheelchair at end of session with all needs within reach.   Therapy Documentation Precautions:  Precautions Precautions: Cervical, Fall Precaution Comments: reviewed cervical precautions with patient, min cueing to recall   Required Braces or Orthoses: Cervical Brace Cervical Brace: Soft collar Restrictions Weight Bearing Restrictions: No General:   Vital Signs:   Pain: Pain Assessment Pain Score: 3  Pain Location: Neck Pain Orientation: Posterior Pain Descriptors / Indicators:  Aching Pain Onset: On-going Pain Intervention(s): Repositioned;Ambulation/increased activity ADL: ADL Grooming: Minimal assistance Where Assessed-Grooming: Sitting at sink Upper Body Bathing: Minimal assistance Where Assessed-Upper Body Bathing: Sitting at sink Lower Body Bathing: Other (comment), Dependent(2 assist) Where Assessed-Lower Body Bathing: Wheelchair, Standing at sink, Sitting at sink(using Geralyn Corwin) Upper Body Dressing: Moderate assistance Where Assessed-Upper Body Dressing: Sitting at sink Lower Body Dressing: Other (Comment), Dependent(2 assist) Where Assessed-Lower Body Dressing: Wheelchair, Standing at sink, Sitting at sink(using Geralyn Corwin) Toileting: Not assessed Toilet Transfer: Not assessed Tub/Shower Transfer: Not assessed    Exercises: Cardiovascular Exercises Kinetron: 3 x 2 minutes seated in w/c 30 cm/sec General Exercises - Lower Extremity Hip ABduction/ADduction: 10 reps;Strengthening;Sidelying(2 sets clamshell) Hip Flexion/Marching: 10 reps;Supine;Strengthening(2 sets marching hooklying) Repetitive Sit to Stands: Two upper extremities(4 reps to stedy) Other Exercises Other Exercises: trunk rotation 2 x 10 sets Other Exercises: bridging with assist 3 sec hold 2 x 10 reps Other Exercises: hip adductor ball squeezes 3 sec hold 2 x 10 reps Other Treatments:     Therapy/Group: Individual Therapy  Alen Bleacher 12/17/2018, 10:52 AM

## 2018-12-17 NOTE — Progress Notes (Signed)
[facility] [facility] PHYSICAL MEDICINE & REHABILITATION PROGRESS NOTE   Subjective/Complaints: Pt in good spirits. RN states he refused his potassium this morning. Told me he was eating more bananas. Had good results with left shoudler injection  ROS: Patient denies fever, rash, sore throat, blurred vision, nausea, vomiting, diarrhea, cough, shortness of breath or chest pain, joint or back pain, headache, or mood change.   Objective:   No results found. Recent Labs    12/15/18 0545  WBC 3.7*  HGB 9.5*  HCT 31.3*  PLT 243   Recent Labs    12/15/18 0545  NA 139  K 3.2*  CL 104  CO2 31  GLUCOSE 92  BUN 10  CREATININE 0.77  CALCIUM 8.8*    Intake/Output Summary (Last 24 hours) at 12/17/2018 0908 Last data filed at 12/17/2018 0700 Gross per 24 hour  Intake 600 ml  Output 1375 ml  Net -775 ml     Physical Exam: Vital Signs Blood pressure 140/80, pulse 67, temperature 97.7 F (36.5 C), temperature source Oral, resp. rate 20, height 5\' 10"  (1.778 m), weight (!) 141.4 kg, SpO2 100 %.   Constitutional: No distress . Vital signs reviewed. HEENT: EOMI, oral membranes moist Neck: supple Cardiovascular: RRR without murmur. No JVD    Respiratory: CTA Bilaterally without wheezes or rales. Normal effort    GI: BS +, non-tender, non-distended   Extremities: No clubbing, cyanosis, or edema Skin: No evidence of breakdown, no evidence of rash Neurologic: Cranial nerves II through XII intact, motor strength is 5/5 in bilateral deltoid, bicep, tricep, grip, hip flexor, 4/5 knee extensors, tr R and 4- Left ankle dorsiflexor and plantar flexor   Musculoskeletal: left shoulder moves more freely.   Assessment/Plan: 1. Functional deficits secondary to cervical myelopathy which require 3+ hours per day of interdisciplinary therapy in a comprehensive inpatient rehab setting.  Physiatrist is providing close team supervision and 24 hour management of active medical problems listed  below.  Physiatrist and rehab team continue to assess barriers to discharge/monitor patient progress toward functional and medical goals  Care Tool:  Bathing  Bathing activity did not occur: Refused Body parts bathed by patient: Chest, Abdomen, Left upper leg, Right upper leg, Face, Right arm, Left arm   Body parts bathed by helper: Front perineal area, Buttocks, Right lower leg, Left lower leg     Bathing assist Assist Level: Moderate Assistance - Patient 50 - 74%     Upper Body Dressing/Undressing Upper body dressing   What is the patient wearing?: Pull over shirt    Upper body assist Assist Level: Minimal Assistance - Patient > 75%    Lower Body Dressing/Undressing Lower body dressing      What is the patient wearing?: Incontinence brief, Pants     Lower body assist Assist for lower body dressing: Maximal Assistance - Patient 25 - 49%     Toileting Toileting    Toileting assist Assist for toileting: 2 Helpers     Transfers Chair/bed transfer  Transfers assist  Chair/bed transfer activity did not occur: Safety/medical concerns  Chair/bed transfer assist level: Minimal Assistance - Patient > 75%(slide board)     Locomotion Ambulation   Ambulation assist   Ambulation activity did not occur: Safety/medical concerns          Walk 10 feet activity   Assist  Walk 10 feet activity did not occur: Safety/medical concerns        Walk 50 feet activity   Assist Walk 50 feet  with 2 turns activity did not occur: Safety/medical concerns         Walk 150 feet activity   Assist Walk 150 feet activity did not occur: Safety/medical concerns         Walk 10 feet on uneven surface  activity   Assist Walk 10 feet on uneven surfaces activity did not occur: Safety/medical concerns         Wheelchair     Assist Will patient use wheelchair at discharge?: Yes Type of Wheelchair: Manual Wheelchair activity did not occur: Safety/medical  concerns  Wheelchair assist level: Supervision/Verbal cueing Max wheelchair distance: 161ft     Wheelchair 50 feet with 2 turns activity    Assist    Wheelchair 50 feet with 2 turns activity did not occur: Safety/medical concerns   Assist Level: Supervision/Verbal cueing   Wheelchair 150 feet activity     Assist Wheelchair 150 feet activity did not occur: Safety/medical concerns   Assist Level: Supervision/Verbal cueing    Medical Problem List and Plan: 1. tetraparesis secondary to cervical myelopathy s/c C3-6 ACDF   -Continue CIR therapies including PT, OT   -continued education   2. Antithrombotics: -DVT/anticoagulation:Mechanical:Antiembolism stockings, knee (TED hose) Bilateral lower extremities Sequential compression devices, below kneeBilateral lower extremities -antiplatelet therapy: N/A 3. Pain Management:On Cymbalta daily. Continue hydrocodone prn  -improved pain after injection 4. Mood:LCSW to follow for evaluation and support. -antipsychotic agents: N/A 5. Neuropsych: This patientiscapable of making decisions on hisown behalf. 6. Skin/Wound Care:Routine pressure relief measures. 7. Fluids/Electrolytes/Nutrition:Monitor I/O.   -continue hypokalemia. 3.2 3/3  -changed to bid---recheck BMET Friday  -discussed rationale with pt as far as supplementation is concerned 8. Urinary retention/BPH: Question plans for TURP. Continue Flomax.  Had Foley changed 2 weeks ago  - consider voiding trial soon---urology to see during admit 9. Anemia of chronic disease: On iron for supplement. 10. Leucopenia: resolved. Recheck WBC normal 3.7 3/3 11. Morbid obesity: Encourage appropriate diet and weight loss to help promote mobility and health. 12. Neurogenic bowel: continue patient education by team on trying to achieve a daily regimen  -discussed with patient today    LOS: 7 days A FACE TO FACE EVALUATION WAS  PERFORMED  Ranelle Oyster 12/17/2018, 9:08 AM

## 2018-12-17 NOTE — Plan of Care (Signed)
  Problem: Consults Goal: RH SPINAL CORD INJURY PATIENT EDUCATION Description  See Patient Education module for education specifics.  Outcome: Progressing Goal: RH SPINAL CORD INJURY PATIENT EDUCATION Description  See Patient Education module for education specifics.  Outcome: Progressing   Problem: RH KNOWLEDGE DEFICIT SCI Goal: RH STG INCREASE KNOWLEDGE OF SELF CARE AFTER SCI Outcome: Progressing

## 2018-12-17 NOTE — Progress Notes (Signed)
Given prn norco and robaxin x2 for back pain with positive effects noted. Pericare and foley care provided. Slept well throughout the night. Refused dulcolax suppository this am, educated on bowel program, continues to refuse.

## 2018-12-17 NOTE — Progress Notes (Signed)
Occupational Therapy Session Note  Patient Details  Name: [name] [name] MRN: [id number] Date of Birth: [birth year]  Today's Date: 12/17/2018 OT Individual Time: 1415-1455 OT Individual Time Calculation (min): 40 min    Short Term Goals: Week 1:  OT Short Term Goal 1 (Week 1): Pt will complete 1/3 components of donning pants using AE as needed  OT Short Term Goal 2 (Week 1): Pt will complete bathing sit<stand with 1 assist  OT Short Term Goal 3 (Week 1): Pt will complete 1 grooming task while semi perched in Stedy to work on standing tolerance   Skilled Therapeutic Interventions/Progress Updates:    Pt seen for OT session fousing on transfers, sit>stand and functional activity tolerance. Pt sitting up in w/c upon arrival with wife present. Pt reporting pain as noted below, agreeable to participate as able. Pt desiring to work on standing.  Taken to gym total A in w/c for time and energy conservation. Completed max A sliding board transfer to therapy mat. Max multimodal cuing for head/hip relationship and hand placement, pt at high risk for sliding off board due to posterior weight bias.  Completed sit>stand from highly elevated EOM with max A +1. Pt sat perched in STEDY to complete ball toss activity, using #2 dowel rod to knock ball back and forth with therapist. VCs for anterior weight shift. Pt able to tolerate ~10 minutes perched in STEDY addressing WBing through B LEs and core strength/stability.  Pt returned to room perched in STEDY. Max A to stand from [facility] and transition to sitting EOB. Required max A to return to supine, total A for management of LEs into bed and VCs for log rolling technique.  Pt left in supine with all needs in reach, wife present and bed alarm on. RN made aware of sediment in catheter bag as well as strong foul odor from bag.   Therapy Documentation Precautions:  Precautions Precautions: Cervical, Fall Precaution Comments: reviewed cervical precautions with  patient, min cueing to recall   Required Braces or Orthoses: Cervical Brace Cervical Brace: Soft collar Restrictions Weight Bearing Restrictions: No Pain:   8/10 pain in back/neck. Pre-medicated prior to tx session.    Therapy/Group: Individual Therapy  Arely Tinner L 12/17/2018, 3:07 PM

## 2018-12-18 ENCOUNTER — Inpatient Hospital Stay (HOSPITAL_COMMUNITY): Payer: No Typology Code available for payment source | Admitting: Occupational Therapy

## 2018-12-18 ENCOUNTER — Inpatient Hospital Stay (HOSPITAL_COMMUNITY): Payer: No Typology Code available for payment source | Admitting: Physical Therapy

## 2018-12-18 LAB — BASIC METABOLIC PANEL
Anion gap: 6 (ref 5–15)
BUN: 12 mg/dL (ref 8–23)
CO2: 27 mmol/L (ref 22–32)
Calcium: 8.7 mg/dL — ABNORMAL LOW (ref 8.9–10.3)
Chloride: 105 mmol/L (ref 98–111)
Creatinine, Ser: 0.68 mg/dL (ref 0.61–1.24)
GFR calc Af Amer: >60 mL/min (ref >60)
GFR calc non Af Amer: >60 mL/min (ref >60)
Glucose, Bld: 91 mg/dL (ref 70–99)
Potassium: 3.4 mmol/L — ABNORMAL LOW (ref 3.5–5.1)
Sodium: 138 mmol/L (ref 135–145)

## 2018-12-18 MED ORDER — POTASSIUM CHLORIDE CRYS ER 20 MEQ PO TBCR
40.0000 meq | EXTENDED_RELEASE_TABLET | Freq: Every day | ORAL | Status: DC
  Administered 2018-12-19 – 2018-12-21 (×3): 40 meq via ORAL
  Filled 2018-12-18 (×3): qty 2

## 2018-12-18 NOTE — Progress Notes (Signed)
Rested well throughout the night. Given prn robaxin and norco x2 for complaints of neck and back of neck pain-partial effects noted. Refused potassium at hs. Dulcolax suppository given as scheduled. Reported BP reading from dynamap 180/105, recheck manually 166/98. Pt asymptomatic, no distress noted.

## 2018-12-18 NOTE — Plan of Care (Signed)
  Problem: Consults Goal: RH SPINAL CORD INJURY PATIENT EDUCATION Description  See Patient Education module for education specifics.  Outcome: Progressing Goal: RH SPINAL CORD INJURY PATIENT EDUCATION Description  See Patient Education module for education specifics.  Outcome: Progressing   Problem: Consults Goal: RH GENERAL PATIENT EDUCATION Description See Patient Education module for education specifics. Outcome: Progressing Goal: Skin Care Protocol Initiated - if Braden Score 18 or less Description If consults are not indicated, leave blank or document N/A Outcome: Progressing   Problem: RH BOWEL ELIMINATION Goal: RH STG MANAGE BOWEL WITH ASSISTANCE Description STG Manage Bowel with mod Assistance.   Outcome: Progressing   Problem: RH SKIN INTEGRITY Goal: RH STG SKIN FREE OF INFECTION/BREAKDOWN Description With mod assist   Outcome: Progressing Goal: RH STG MAINTAIN SKIN INTEGRITY WITH ASSISTANCE Description STG Maintain Skin Integrity With mod Assistance.   Outcome: Progressing Goal: RH STG ABLE TO PERFORM INCISION/WOUND CARE W/ASSISTANCE Description STG Able To Perform Incision/Wound Care With mod Assistance.  Outcome: Progressing   Problem: RH SAFETY Goal: RH STG ADHERE TO SAFETY PRECAUTIONS W/ASSISTANCE/DEVICE Description STG Adhere to Safety Precautions With Assistance/Device. Min assist  Outcome: Progressing   Problem: RH PAIN MANAGEMENT Goal: RH STG PAIN MANAGED AT OR BELOW PT'S PAIN GOAL Description Pain scale <4/10  Outcome: Progressing   Problem: RH KNOWLEDGE DEFICIT SCI Goal: RH STG INCREASE KNOWLEDGE OF SELF CARE AFTER SCI Outcome: Progressing

## 2018-12-18 NOTE — Progress Notes (Signed)
Physical Therapy Weekly Progress Note  Patient Details  Name: [name] [name] MRN: [id number] Date of Birth: May 19, 1956  Beginning of progress report period: December 11, 2018 End of progress report period: December 18, 2018  Today's Date: 12/18/2018 PT Individual Time: 1415-1530 PT Individual Time Calculation (min): 75 min   Patient has met 3 of 3 short term goals.  Pt has made good progress over the past week and is able to complete slide board transfer with mod to max A with cueing for head/hips relationship. Pt is able to propel manual w/c x 100 ft with use of BUE and Supervision before onset of fatigue. Pt is able to stand to stedy with max A x 1-2 depending on fatigue level, shoulder pain, and surface height he is standing from. Pt is able to tolerate standing in standing frame x 25 min with good demonstration of upright posture.  Patient continues to demonstrate the following deficits muscle weakness, impaired timing and sequencing and unbalanced muscle activation and decreased standing balance, decreased postural control and decreased balance strategies and therefore will continue to benefit from skilled PT intervention to increase functional independence with mobility. Pt continues to be limited by L shoulder pain which limits his ability to assist with standing and standing tolerance.  Patient progressing toward long term goals..  Continue plan of care.  PT Short Term Goals Week 1:  PT Short Term Goal 1 (Week 1): Pt will complete rolling with mod A consistently PT Short Term Goal 1 - Progress (Week 1): Met PT Short Term Goal 2 (Week 1): Pt will complete least restrictive transfer with max A PT Short Term Goal 2 - Progress (Week 1): Met PT Short Term Goal 3 (Week 1): Pt will initiate w/c mobility PT Short Term Goal 3 - Progress (Week 1): Met Week 2:  PT Short Term Goal 1 (Week 2): Pt will complete slide board transfer with mod A consistently PT Short Term Goal 2 (Week 2): Pt will stand with  mod A consistently with LRAD PT Short Term Goal 3 (Week 2): Pt will propel manual w/c x 150 ft with use of BUE and Supervision  Skilled Therapeutic Interventions/Progress Updates:  Pt received seated in w/c in room, agreeable to PT session. See pain details below. Manual w/c propulsion x 100 ft with use of BUE and Supervision. Sit to stand in standing frame: pt tolerates standing x 25 min total. Pt engages in BUE task sorting cards while supported in standing frame. Reaching outside BOS and across midline for cards with RUE for standing weight shift. Decreased support from standing frame and pt is able to maintain upright standing posture for several minutes. Standing mini-squats while in standing frame with decreased support from sling, 3 x 10 reps. Pt has incontinence of bowel while in standing. Sit to stand with max A x 2 from w/c to stedy. Stedy transfer back to bed. Sit to supine mod A for BLE management. Rolling L/R with mod A and use of bedrails for dependent brief change and pericare. Education with patient about attempting to use commode after eating as that is when he tends to have bowel movements and he had refused use of commode earlier after lunch but then was incontinent immediately following his refusal, pt agreeable. Pt left seated in bed with needs in reach, bed alarm in place.  Therapy Documentation Precautions:  Precautions Precautions: Cervical, Fall Precaution Comments: reviewed cervical precautions with patient, min cueing to recall   Required Braces or Orthoses: Cervical  Brace Cervical Brace: Soft collar Restrictions Weight Bearing Restrictions: No Pain: Pain Assessment Pain Scale: 0-10 Pain Score: 7  Pain Type: Acute pain Pain Location: Neck Pain Orientation: Posterior Pain Radiating Towards: shoulders Pain Descriptors / Indicators: Aching Pain Frequency: Constant Pain Onset: On-going Patients Stated Pain Goal: 2 Pain Intervention(s): Medication (See eMAR)(Robaxin  and tylenol) Multiple Pain Sites: No   Therapy/Group: Individual Therapy   Excell Seltzer, PT, DPT 12/18/2018, 3:41 PM

## 2018-12-18 NOTE — Progress Notes (Signed)
Occupational Therapy Session Note  Patient Details  Name: [name] [name] MRN: [id number] Date of Birth: 06-Mar-1956  Today's Date: 12/18/2018 OT Individual Time: 3154-0086 and 7619-5093 OT Individual Time Calculation (min): 70 min and 44 min  Short Term Goals: Week 1:  OT Short Term Goal 1 (Week 1): Pt will complete 1/3 components of donning pants using AE as needed  OT Short Term Goal 2 (Week 1): Pt will complete bathing sit<stand with 1 assist  OT Short Term Goal 3 (Week 1): Pt will complete 1 grooming task while semi perched in Stedy to work on standing tolerance   Skilled Therapeutic Interventions/Progress Updates:    Pt greeted in bed, premedicated for pain. Wanting to get washed up. After donning soft c-collar, supine<sit completed with Mod A. Sit<stand in [place] from elevated surface completed with Mod A as well. Worked on core strengthening/LE weightbearing while he completed UB bathing semi perched in [place]. Pt able to remain in semi perched or standing position for 22 minutes in total for bathing and perihygiene. While seated in w/c, pt used LH sponge to wash feet and reacher to thread LEs into pants. Sit<stand from low w/c completed with Max A and vcs for technique. OT often repositioned feet on platform due to sliding off. Pt is still unable to assist with elevating pants over hips due to needing B UE support on Stedy bar to maintain standing balance. While semi perched in [place], he completed oral care. Increased flexor tone in Rt knee noted at this time, with pts leg kicked back off of platform. He resumed UB dressing while sitting for safety with Mod A due to L UE AROM limitations. Teds and sneakers donned with Total A. He would benefit from using sock aide and shoe funnel. Throughout session, he was able to complete reciprocal scooting back in w/c with vcs alone. After handwashing, pt was left with all needs within reach. Tx focus placed on core strengthening, sit<stands, standing  balance/tolerance, LE weightbearing, and adaptive bathing/dressing skills.   Continued to educate pt on benefits of using BSC for toileting vs relying on brief. He reports still refusing to sit on [facility] after meals with staff, though aware this is an MD order.   2nd Session 1:1 tx (44 min) Pt greeted in w/c, premedicated for Lt shoulder pain. Amenable to tx. Provided pt with shoe funnel and issued elastic shoe laces. Tx focus placed on increasing functional independence with donning footwear. With verbal and demonstrational cuing alone, pt in time able to doff and don footwear using reacher and shoe funnel. Able to retrieve dropped AE from floor with increased time. Afterwards he was amenable to attempt toileting. Tried multiple sit<stands however pt reported Lt shoulder was too painful for powering up into [place]. Agreeable to attempt with nursing staff within the hour "after the pain pill kicks in." Discussed with RN to check in on pt for toileting. Also notified NT (for +2 assist). Pt left in w/c with all needs within reach. He declined for OT to provide him with hot/cold modalities for managing Lt shoulder pain.   Therapy Documentation Precautions:  Precautions Precautions: Cervical, Fall Precaution Comments: reviewed cervical precautions with patient, min cueing to recall   Required Braces or Orthoses: Cervical Brace Cervical Brace: Soft collar Restrictions Weight Bearing Restrictions: No Pain Assessment Pain Scale: 0-10 Pain Score: 2  Pain Type: Acute pain Pain Location: Neck Pain Orientation: Posterior Pain Descriptors / Indicators: Aching Pain Frequency: Constant Pain Onset: On-going Patients Stated  Pain Goal: 2 Pain Intervention(s): Repositioned;Refused(states he is OK) Multiple Pain Sites: No ADL: ADL Grooming: Minimal assistance Where Assessed-Grooming: Sitting at sink Upper Body Bathing: Minimal assistance Where Assessed-Upper Body Bathing: Sitting at sink Lower Body  Bathing: Other (comment), Dependent(2 assist) Where Assessed-Lower Body Bathing: Wheelchair, Standing at sink, Sitting at sink(using [name] [name]) Upper Body Dressing: Moderate assistance Where Assessed-Upper Body Dressing: Sitting at sink Lower Body Dressing: Other (Comment), Dependent(2 assist) Where Assessed-Lower Body Dressing: Wheelchair, Standing at sink, Sitting at sink(using [name] [name]) Toileting: Not assessed Toilet Transfer: Not assessed Tub/Shower Transfer: Not assessed      Therapy/Group: Individual Therapy  [name] [name] 12/18/2018, 12:30 PM

## 2018-12-18 NOTE — Plan of Care (Signed)
  Problem: Consults Goal: RH SPINAL CORD INJURY PATIENT EDUCATION Description  See Patient Education module for education specifics.  Outcome: Progressing Goal: RH SPINAL CORD INJURY PATIENT EDUCATION Description  See Patient Education module for education specifics.  Outcome: Progressing

## 2018-12-18 NOTE — Progress Notes (Signed)
[place] PHYSICAL MEDICINE & REHABILITATION PROGRESS NOTE   Subjective/Complaints: Up with OT at sink. No new issues. Had a good night.   ROS: Patient denies fever, rash, sore throat, blurred vision, nausea, vomiting, diarrhea, cough, shortness of breath or chest pain, joint or back pain, headache, or mood change.    Objective:   No results found. No results for input(s): WBC, HGB, HCT, PLT in the last 72 hours. Recent Labs    12/18/18 0359  NA 138  K 3.4*  CL 105  CO2 27  GLUCOSE 91  BUN 12  CREATININE 0.68  CALCIUM 8.7*    Intake/Output Summary (Last 24 hours) at 12/18/2018 1011 Last data filed at 12/18/2018 0418 Gross per 24 hour  Intake 360 ml  Output 1750 ml  Net -1390 ml     Physical Exam: Vital Signs Blood pressure (!) 154/88, pulse 67, temperature 97.8 F (36.6 C), temperature source Oral, resp. rate 19, height 5\' 10"  (1.778 m), weight (!) 138.5 kg, SpO2 100 %.   Constitutional: No distress . Vital signs reviewed. obese HEENT: EOMI, oral membranes moist Neck: supple Cardiovascular: RRR without murmur. No JVD    Respiratory: CTA Bilaterally without wheezes or rales. Normal effort    GI: BS +, non-tender, non-distended  Extremities: No clubbing, cyanosis, or edema Skin: No evidence of breakdown, no evidence of rash, chronic pock marks Neurologic: Cranial nerves II through XII intact, motor strength is 5/5 in bilateral deltoid, bicep, tricep, grip, hip flexor, 4/5 knee extensors, tr R and 4- Left ankle dorsiflexor and plantar flexor--no changes   Musculoskeletal: left shoulder moves more freely.   Assessment/Plan: 1. Functional deficits secondary to cervical myelopathy which require 3+ hours per day of interdisciplinary therapy in a comprehensive inpatient rehab setting.  Physiatrist is providing close team supervision and 24 hour management of active medical problems listed below.  Physiatrist and rehab team continue to assess barriers to  discharge/monitor patient progress toward functional and medical goals  Care Tool:  Bathing  Bathing activity did not occur: Refused Body parts bathed by patient: Chest, Abdomen, Left upper leg, Right upper leg, Face, Right arm, Left arm   Body parts bathed by helper: Front perineal area, Buttocks, Right lower leg, Left lower leg     Bathing assist Assist Level: Moderate Assistance - Patient 50 - 74%     Upper Body Dressing/Undressing Upper body dressing   What is the patient wearing?: Pull over shirt    Upper body assist Assist Level: Moderate Assistance - Patient 50 - 74%    Lower Body Dressing/Undressing Lower body dressing      What is the patient wearing?: Incontinence brief, Pants     Lower body assist Assist for lower body dressing: Maximal Assistance - Patient 25 - 49%     Toileting Toileting    Toileting assist Assist for toileting: Total Assistance - Patient < 25%     Transfers Chair/bed transfer  Transfers assist  Chair/bed transfer activity did not occur: Safety/medical concerns  Chair/bed transfer assist level: Moderate Assistance - Patient 50 - 74%(slide board)     Locomotion Ambulation   Ambulation assist   Ambulation activity did not occur: Safety/medical concerns          Walk 10 feet activity   Assist  Walk 10 feet activity did not occur: Safety/medical concerns        Walk 50 feet activity   Assist Walk 50 feet with 2 turns activity did not occur: Safety/medical concerns  Walk 150 feet activity   Assist Walk 150 feet activity did not occur: Safety/medical concerns         Walk 10 feet on uneven surface  activity   Assist Walk 10 feet on uneven surfaces activity did not occur: Safety/medical concerns         Wheelchair     Assist Will patient use wheelchair at discharge?: Yes Type of Wheelchair: Manual Wheelchair activity did not occur: Safety/medical concerns  Wheelchair assist level:  Supervision/Verbal cueing Max wheelchair distance: 100'    Wheelchair 50 feet with 2 turns activity    Assist    Wheelchair 50 feet with 2 turns activity did not occur: Safety/medical concerns   Assist Level: Supervision/Verbal cueing   Wheelchair 150 feet activity     Assist Wheelchair 150 feet activity did not occur: Safety/medical concerns   Assist Level: Supervision/Verbal cueing    Medical Problem List and Plan: 1. tetraparesis secondary to cervical myelopathy s/c C3-6 ACDF   -Continue CIR therapies including PT, OT   -continued education   2. Antithrombotics: -DVT/anticoagulation:Mechanical:Antiembolism stockings, knee (TED hose) Bilateral lower extremities Sequential compression devices, below kneeBilateral lower extremities -antiplatelet therapy: N/A 3. Pain Management:On Cymbalta daily. Continue hydrocodone prn  -improved left pain after injection 3/4 4. Mood:LCSW to follow for evaluation and support. -antipsychotic agents: N/A 5. Neuropsych: This patientiscapable of making decisions on hisown behalf. 6. Skin/Wound Care:Routine pressure relief measures. 7. Fluids/Electrolytes/Nutrition:Monitor I/O.   - hypokalemia--3.4 today  -pt is eating more bananas and fruit  -will change kdur to daily only  -recheck bmet monday  -reviewed that he needs to remain on supplementation for now 8. Urinary retention/BPH: Question plans for TURP. Continue Flomax.  Had Foley changed 2 weeks ago  - consider voiding trial soon---urology to see during admit at some point 9. Anemia of chronic disease: On iron for supplement. 10. Leukopenia: resolved. Recheck WBC normal 3.7 3/3 11. Morbid obesity: Encourage appropriate diet and weight loss to help promote mobility and health. 12. Neurogenic bowel: continue patient education by team on trying to achieve a daily regimen  -have reviewed with him on several occasions    LOS: 8  days A FACE TO FACE EVALUATION WAS PERFORMED  Ranelle Oyster 12/18/2018, 10:11 AM

## 2018-12-19 ENCOUNTER — Inpatient Hospital Stay (HOSPITAL_COMMUNITY): Payer: No Typology Code available for payment source | Admitting: Occupational Therapy

## 2018-12-19 DIAGNOSIS — E876 Hypokalemia: Secondary | ICD-10-CM

## 2018-12-19 DIAGNOSIS — Z9889 Other specified postprocedural states: Secondary | ICD-10-CM

## 2018-12-19 DIAGNOSIS — D649 Anemia, unspecified: Secondary | ICD-10-CM

## 2018-12-19 DIAGNOSIS — M5416 Radiculopathy, lumbar region: Secondary | ICD-10-CM

## 2018-12-19 DIAGNOSIS — R339 Retention of urine, unspecified: Secondary | ICD-10-CM

## 2018-12-19 NOTE — Progress Notes (Signed)
Occupational Therapy Weekly Progress Note  Patient Details  Name: [name] [name] MRN: [id number] Date of Birth: 1956-07-19  Beginning of progress report period: 12/11/18 End of progress report period: 12/19/18  Today's Date: 12/19/2018 OT Individual Time: 4481-8563 OT Individual Time Calculation (min): 71 min   Patient has met 3 of 3 short term goals.   Pt has made good progress at time of report and has met all STGs. He has improved his standing tolerance using the Bariatric Stedy. Pt now exhibits the ability to maintain supported standing for ~22 minutes during self care tasks. He continues to be limited functionally by Lt shoulder pain and ongoing cervical pain. Despite these limitations, pt is consistently motivated to do as much as he can and maximize his therapy time. Education regarding importance of timed toileting is ongoing, though pt has been refusing to attempt with OT and nursing staff. Will continue working towards LTG achievement for safe transition home with spouse on 3/17.     Patient continues to demonstrate the following deficits: muscle weakness and muscle paralysis, decreased cardiorespiratoy endurance, impaired timing and sequencing, unbalanced muscle activation and decreased coordination and decreased standing balance and decreased balance strategies and therefore will continue to benefit from skilled OT intervention to enhance overall performance with BADL.  Patient progressing toward long term goals..  Continue plan of care.  OT Short Term Goals Week 1:  OT Short Term Goal 1 (Week 1): Pt will complete 1/3 components of donning pants using AE as needed  OT Short Term Goal 1 - Progress (Week 1): Met OT Short Term Goal 2 (Week 1): Pt will complete bathing sit<stand with 1 assist  OT Short Term Goal 2 - Progress (Week 1): Met OT Short Term Goal 3 (Week 1): Pt will complete 1 grooming task while semi perched in [place] to work on standing tolerance  OT Short Term Goal 3 -  Progress (Week 1): Met Week 2:  OT Short Term Goal 1 (Week 2): STGs=LTGs due to ELOS  Skilled Therapeutic Interventions/Progress Updates:    Pt greeted in bed and premedicated for cervical pain. Already completed bathing with RN staff this AM, and opted to start session with dressing. After c collar was donned, Mod A for supine<sit with increased time using hospital bed functions. Pt limited by Lt shoulder pain in his ability to assist more. While EOB, worked on core strengthening during dynamic activity of LB dressing using reacher and shoe funnel. OT donned Teds. Mod-Max A sit<stand from elevated bed using Stedy. Vcs and manual facilitation required for tucking in buttocks and anterior weight shifting. Pt unable to remove one UE to assist with elevating pants over hips. While in semi perched position in [place], he donned shirt with Mod A. Continued to work on LE weightbearing/supported standing during oral care completion at sink using Stedy. Pt with increased flexor synergy in B knees, with feet kicking back and sliding off platform. OT often held feet in place. Did not attempt toilet transfer this AM due to increased LE flexor activity and safety with Jeralene Huff transfers. Therefore, for remainder of session worked on strengthening of extensor LE muscles via self propulsion of w/c backwards down hallway (pushing off through feet) approx 320 ft. Pt required vcs and manual facilitation at times for keeping hips adducted and minimizing Rt foot supination. While back in room, pt completed active B hamstring stretches using wall for 1 minute holds. Educated pt to push out through heel to maximize stretch. At end of  session pt remained in w/c with all needs within reach.    Pt is still very resistant to attempt BSC transfers. OT has continued education regarding benefits for skin integrity, hygiene, and therapy participation. He states "I am a grown man- I don't need to be potty trained." Also emphasized that  this is an MD order, with pt reporting: "Well I'm giving my own order- I won't do it." He continues to benefit from education.    Therapy Documentation Precautions:  Precautions Precautions: Cervical, Fall Precaution Comments: reviewed cervical precautions with patient, min cueing to recall   Required Braces or Orthoses: Cervical Brace Cervical Brace: Soft collar Restrictions Weight Bearing Restrictions: No Pain: Pain Assessment Pain Scale: 0-10 Pain Score: 1  Pain Type: Acute pain Pain Location: Neck Pain Orientation: Right;Left;Posterior Pain Radiating Towards: shoulders Pain Descriptors / Indicators: Aching;Discomfort Pain Frequency: Constant Pain Onset: On-going Patients Stated Pain Goal: 2 Pain Intervention(s): Medication (See eMAR) ADL: ADL Grooming: Minimal assistance Where Assessed-Grooming: Sitting at sink Upper Body Bathing: Minimal assistance Where Assessed-Upper Body Bathing: Sitting at sink Lower Body Bathing: Other (comment), Dependent(2 assist) Where Assessed-Lower Body Bathing: Wheelchair, Standing at sink, Sitting at sink(using [employer]) Upper Body Dressing: Moderate assistance Where Assessed-Upper Body Dressing: Sitting at sink Lower Body Dressing: Other (Comment), Dependent(2 assist) Where Assessed-Lower Body Dressing: Wheelchair, Standing at sink, Sitting at sink(using Jeralene Huff) Toileting: Not assessed Toilet Transfer: Not assessed Tub/Shower Transfer: Not assessed     Therapy/Group: Individual Therapy  Nickolette Espinola A Braddock Servellon 12/19/2018, 12:13 PM

## 2018-12-19 NOTE — Progress Notes (Signed)
[place] PHYSICAL MEDICINE & REHABILITATION PROGRESS NOTE   Subjective/Complaints: Patient seen laying in bed this morning.  He states he slept well overnight.  He is in good spirits.  ROS: Denies CP, SOB, N/V/D  Objective:   No results found. No results for input(s): WBC, HGB, HCT, PLT in the last 72 hours. Recent Labs    12/18/18 0359  NA 138  K 3.4*  CL 105  CO2 27  GLUCOSE 91  BUN 12  CREATININE 0.68  CALCIUM 8.7*    Intake/Output Summary (Last 24 hours) at 12/19/2018 0853 Last data filed at 12/19/2018 0804 Gross per 24 hour  Intake 444 ml  Output 1800 ml  Net -1356 ml     Physical Exam: Vital Signs Blood pressure (!) 161/97, pulse 67, temperature 97.9 F (36.6 C), resp. rate 19, height 5\' 10"  (1.778 m), weight (!) 138.5 kg, SpO2 100 %.  Constitutional: No distress . Vital signs reviewed. HENT: Normocephalic.  Atraumatic. Eyes: EOMI. No discharge. Cardiovascular: RRR. No JVD. Respiratory: CTA Bilaterally. Normal effort. GI: BS +. Non-distended. Musc: No edema or tenderness in extremities. Skin: Warm and dry.  Intact. Neurologic: Alert and oriented Motor: 5/5 in bilateral deltoid, bicep, tricep, grip  Right lower extremity: 3/5 proximal distal Left lower extremity: 3--3/5 proximal to distal  Psych: Good spirits.  Does not affect.  Normal behavior.  Assessment/Plan: 1. Functional deficits secondary to cervical myelopathy which require 3+ hours per day of interdisciplinary therapy in a comprehensive inpatient rehab setting.  Physiatrist is providing close team supervision and 24 hour management of active medical problems listed below.  Physiatrist and rehab team continue to assess barriers to discharge/monitor patient progress toward functional and medical goals  Care Tool:  Bathing  Bathing activity did not occur: Refused Body parts bathed by patient: Chest, Abdomen, Left upper leg, Right upper leg, Face, Right arm, Left arm   Body parts bathed by  helper: Front perineal area, Buttocks, Right lower leg, Left lower leg     Bathing assist Assist Level: Moderate Assistance - Patient 50 - 74%     Upper Body Dressing/Undressing Upper body dressing   What is the patient wearing?: Pull over shirt    Upper body assist Assist Level: Moderate Assistance - Patient 50 - 74%    Lower Body Dressing/Undressing Lower body dressing      What is the patient wearing?: Incontinence brief, Pants     Lower body assist Assist for lower body dressing: Maximal Assistance - Patient 25 - 49%     Toileting Toileting    Toileting assist Assist for toileting: Total Assistance - Patient < 25%     Transfers Chair/bed transfer  Transfers assist  Chair/bed transfer activity did not occur: Safety/medical concerns  Chair/bed transfer assist level: Dependent - mechanical lift     Locomotion Ambulation   Ambulation assist   Ambulation activity did not occur: Safety/medical concerns          Walk 10 feet activity   Assist  Walk 10 feet activity did not occur: Safety/medical concerns        Walk 50 feet activity   Assist Walk 50 feet with 2 turns activity did not occur: Safety/medical concerns         Walk 150 feet activity   Assist Walk 150 feet activity did not occur: Safety/medical concerns         Walk 10 feet on uneven surface  activity   Assist Walk 10 feet on uneven surfaces  activity did not occur: Safety/medical concerns         Wheelchair     Assist Will patient use wheelchair at discharge?: Yes Type of Wheelchair: Manual Wheelchair activity did not occur: Safety/medical concerns  Wheelchair assist level: Supervision/Verbal cueing Max wheelchair distance: 100'    Wheelchair 50 feet with 2 turns activity    Assist    Wheelchair 50 feet with 2 turns activity did not occur: Safety/medical concerns   Assist Level: Supervision/Verbal cueing   Wheelchair 150 feet activity     Assist  Wheelchair 150 feet activity did not occur: Safety/medical concerns   Assist Level: Supervision/Verbal cueing    Medical Problem List and Plan: 1. tetraparesis secondary to cervical myelopathy s/c C3-6 ACDF   Continue CIR  Notes reviewed  -continued education   2. Antithrombotics: -DVT/anticoagulation:Mechanical:Antiembolism stockings, knee (TED hose) Bilateral lower extremities Sequential compression devices, below kneeBilateral lower extremities -antiplatelet therapy: N/A 3. Pain Management:On Cymbalta daily. Continue hydrocodone prn  -improved left pain after injection 3/4 4. Mood:LCSW to follow for evaluation and support. -antipsychotic agents: N/A 5. Neuropsych: This patientiscapable of making decisions on hisown behalf. 6. Skin/Wound Care:Routine pressure relief measures. 7. Fluids/Electrolytes/Nutrition:Monitor I/O.   Potassium 3.4 on 3/6  -pt is eating more bananas and fruit  -Changed kdur to daily only  Labs ordered for Monday  -reviewed that he needs to remain on supplementation for now 8. Urinary retention/BPH: Question plans for TURP. Continue Flomax.  Had Foley changed 2 weeks ago  - consider voiding trial soon---urology to see during admit at some point 9. Anemia of chronic disease: On iron for supplement.  Hemoglobin 9.5 on 3/3  Labs ordered for Monday 10. Leukopenia: resolved. Recheck WBC normal 3.7 3/3 11. Morbid obesity: Encourage appropriate diet and weight loss to help promote mobility and health. 12. Neurogenic bowel: continue patient education by team on trying to achieve a daily regimen  LOS: 9 days A FACE TO FACE EVALUATION WAS PERFORMED  Ankit Karis Juba 12/19/2018, 8:53 AM

## 2018-12-20 ENCOUNTER — Inpatient Hospital Stay (HOSPITAL_COMMUNITY): Payer: No Typology Code available for payment source | Admitting: Occupational Therapy

## 2018-12-20 DIAGNOSIS — D638 Anemia in other chronic diseases classified elsewhere: Secondary | ICD-10-CM

## 2018-12-20 NOTE — Progress Notes (Signed)
Occupational Therapy Session Note  Patient Details  Name: [name] [name] MRN: [id number] Date of Birth: 12-19-1955   Skilled Therapeutic Interventions/Progress Updates:    Pt seen for makeup therapy time, however due to fatigue, he did not want to participate or get OOB.   Therapy Documentation Precautions:  Precautions Precautions: Cervical, Fall Precaution Comments: reviewed cervical precautions with patient, min cueing to recall   Required Braces or Orthoses: Cervical Brace Cervical Brace: Soft collar Restrictions Weight Bearing Restrictions: No Pain: Pain Assessment Pain Scale: 0-10 Pain Score: Asleep Pain Type: Acute pain Pain Location: Neck Pain Orientation: Right;Left;Posterior Pain Radiating Towards: shoulders Pain Descriptors / Indicators: Aching Pain Frequency: Constant Pain Onset: On-going Patients Stated Pain Goal: 0 Pain Intervention(s): Medication (See eMAR) ADL: ADL Grooming: Supervision/safety Where Assessed-Grooming: Other (comment)(standing in [place]) Upper Body Bathing: Supervision/safety Where Assessed-Upper Body Bathing: Other (Comment)(semi perched in [place]) Lower Body Bathing: Moderate assistance Where Assessed-Lower Body Bathing: Standing at sink, Sitting at sink(using Bariatric Stedy) Upper Body Dressing: Moderate assistance Where Assessed-Upper Body Dressing: Sitting at sink Lower Body Dressing: Moderate assistance Where Assessed-Lower Body Dressing: Wheelchair, Standing at sink, Sitting at sink(using Geralyn Corwin) Toileting: Other (Comment)(Sit<stand in [place]- 2 assist) Toilet Transfer: Other (comment)(2 assist) Toilet Transfer Method: Stand pivot Toilet Transfer Equipment: Bedside commode Tub/Shower Transfer: Not assessed      Therapy/Group: Individual Therapy  [name] [name] A [name] [name] 12/20/2018, 2:59 PM

## 2018-12-20 NOTE — Progress Notes (Signed)
Occupational Therapy Session Note  Patient Details  Name: [name] [name] MRN: [id number] Date of Birth: 07-Aug-1956  Today's Date: 12/20/2018 OT Group Time: 1100-1200 OT Group Time Calculation (min): 60 min  Skilled Therapeutic Interventions/Progress Updates:    Pt engaged in therapeutic w/c level dance group focusing on patient choice, UE/LE strengthening, salience, activity tolerance, and social participation. Pt was guided through various dance-based exercises involving UEs/LEs and trunk. All music was selected by group members. Emphasis placed on general strengthening and activity tolerance. Pt with high levels of participation in group, incorporating L UE per his tolerance due to shoulder pain. NT sat on his Lt side for gentle shoulder movement during handholding. R LE fatigued sooner than Lt during LB exercises. He initiated rest breaks as needed. Able to sit unsupported and rotate trunk for core strengthening. At end of session he self propelled himself back to room using Rt leg rest for support.    Therapy Documentation Precautions:  Precautions Precautions: Cervical, Fall Precaution Comments: reviewed cervical precautions with patient, min cueing to recall   Required Braces or Orthoses: Cervical Brace Cervical Brace: Soft collar Restrictions Weight Bearing Restrictions: No Pain: No s/s pain during tx  Pain Assessment Pain Score: 3  ADL: ADL Grooming: Supervision/safety Where Assessed-Grooming: Other (comment)(standing in [place]) Upper Body Bathing: Supervision/safety Where Assessed-Upper Body Bathing: Other (Comment)(semi perched in [place]) Lower Body Bathing: Moderate assistance Where Assessed-Lower Body Bathing: Standing at sink, Sitting at sink(using Bariatric Stedy) Upper Body Dressing: Moderate assistance Where Assessed-Upper Body Dressing: Sitting at sink Lower Body Dressing: Moderate assistance Where Assessed-Lower Body Dressing: Wheelchair, Standing at sink, Sitting at  sink(using Geralyn Corwin) Toileting: Other (Comment)(Sit<stand in [place]- 2 assist) Toilet Transfer: Other (comment)(2 assist) Toilet Transfer Method: Stand pivot Toilet Transfer Equipment: Bedside commode Tub/Shower Transfer: Not assessed      Therapy/Group: Group Therapy  [name] [name] 12/20/2018, 12:50 PM

## 2018-12-20 NOTE — Progress Notes (Signed)
[place] PHYSICAL MEDICINE & REHABILITATION PROGRESS NOTE   Subjective/Complaints: Patient seen laying in bed this morning.  He states he slept well overnight.  He denies complaints.  He remains very positive.  ROS: Denies CP, SOB, N/V/D  Objective:   No results found. No results for input(s): WBC, HGB, HCT, PLT in the last 72 hours. Recent Labs    12/18/18 0359  NA 138  K 3.4*  CL 105  CO2 27  GLUCOSE 91  BUN 12  CREATININE 0.68  CALCIUM 8.7*    Intake/Output Summary (Last 24 hours) at 12/20/2018 1622 Last data filed at 12/20/2018 0800 Gross per 24 hour  Intake -  Output 1230 ml  Net -1230 ml     Physical Exam: Vital Signs Blood pressure 132/75, pulse 76, temperature 98.1 F (36.7 C), resp. rate 18, height 5\' 10"  (1.778 m), weight (!) 138.5 kg, SpO2 100 %.  Constitutional: No distress . Vital signs reviewed. HENT: Normocephalic.  Atraumatic. Eyes: EOMI. No discharge. Cardiovascular: RRR.  No JVD. Respiratory: CTA bilaterally.  Normal effort. GI: BS +. Non-distended. Musc: No edema or tenderness in extremities. Skin: Warm and dry.  Intact. Neurologic: Alert and oriented Motor: 5/5 in bilateral deltoid, bicep, tricep, grip  Right lower extremity: 3/5 proximal distal, stable Left lower extremity: 3--3/5 proximal to distal, stable Psych: Good spirits.  Does not affect.  Normal behavior.  Assessment/Plan: 1. Functional deficits secondary to cervical myelopathy which require 3+ hours per day of interdisciplinary therapy in a comprehensive inpatient rehab setting.  Physiatrist is providing close team supervision and 24 hour management of active medical problems listed below.  Physiatrist and rehab team continue to assess barriers to discharge/monitor patient progress toward functional and medical goals  Care Tool:  Bathing  Bathing activity did not occur: Refused Body parts bathed by patient: Chest, Abdomen, Left upper leg, Right upper leg, Face, Right arm,  Left arm   Body parts bathed by helper: Front perineal area, Buttocks, Right lower leg, Left lower leg     Bathing assist Assist Level: Moderate Assistance - Patient 50 - 74%     Upper Body Dressing/Undressing Upper body dressing   What is the patient wearing?: Pull over shirt    Upper body assist Assist Level: Moderate Assistance - Patient 50 - 74%    Lower Body Dressing/Undressing Lower body dressing      What is the patient wearing?: Pants     Lower body assist Assist for lower body dressing: Moderate Assistance - Patient 50 - 74%     Toileting Toileting    Toileting assist Assist for toileting: Total Assistance - Patient < 25%     Transfers Chair/bed transfer  Transfers assist  Chair/bed transfer activity did not occur: Safety/medical concerns  Chair/bed transfer assist level: Dependent - mechanical lift     Locomotion Ambulation   Ambulation assist   Ambulation activity did not occur: Safety/medical concerns          Walk 10 feet activity   Assist  Walk 10 feet activity did not occur: Safety/medical concerns        Walk 50 feet activity   Assist Walk 50 feet with 2 turns activity did not occur: Safety/medical concerns         Walk 150 feet activity   Assist Walk 150 feet activity did not occur: Safety/medical concerns         Walk 10 feet on uneven surface  activity   Assist Walk 10 feet on  uneven surfaces activity did not occur: Safety/medical concerns         Wheelchair     Assist Will patient use wheelchair at discharge?: Yes Type of Wheelchair: Manual Wheelchair activity did not occur: Safety/medical concerns  Wheelchair assist level: Supervision/Verbal cueing Max wheelchair distance: 100'    Wheelchair 50 feet with 2 turns activity    Assist    Wheelchair 50 feet with 2 turns activity did not occur: Safety/medical concerns   Assist Level: Supervision/Verbal cueing   Wheelchair 150 feet activity      Assist Wheelchair 150 feet activity did not occur: Safety/medical concerns   Assist Level: Supervision/Verbal cueing    Medical Problem List and Plan: 1. tetraparesis secondary to cervical myelopathy s/c C3-6 ACDF   Continue CIR 2. Antithrombotics: -DVT/anticoagulation:Mechanical:Antiembolism stockings, knee (TED hose) Bilateral lower extremities Sequential compression devices, below kneeBilateral lower extremities -antiplatelet therapy: N/A 3. Pain Management:On Cymbalta daily. Continue hydrocodone prn  -improved left pain after injection 3/4 4. Mood:LCSW to follow for evaluation and support. -antipsychotic agents: N/A 5. Neuropsych: This patientiscapable of making decisions on hisown behalf. 6. Skin/Wound Care:Routine pressure relief measures. 7. Fluids/Electrolytes/Nutrition:Monitor I/O.   Potassium 3.4 on 3/6  -pt is eating more bananas and fruit  -Changed kdur to daily only  Labs ordered for tomorrow 8. Urinary retention/BPH: Question plans for TURP. Continue Flomax.  - consider voiding trial soon---urology to see during admit at some point 9. Anemia of chronic disease: On iron for supplement.  Hemoglobin 9.5 on 3/3  Labs ordered for tomorrow 10. Leukopenia: resolved.  11. Morbid obesity: Encourage appropriate diet and weight loss to help promote mobility and health. 12. Neurogenic bowel: continue patient education by team on trying to achieve a daily regimen  LOS: 10 days A FACE TO FACE EVALUATION WAS PERFORMED  Ankit Karis Juba 12/20/2018, 4:22 PM

## 2018-12-21 ENCOUNTER — Inpatient Hospital Stay (HOSPITAL_COMMUNITY): Payer: No Typology Code available for payment source | Admitting: Occupational Therapy

## 2018-12-21 ENCOUNTER — Inpatient Hospital Stay (HOSPITAL_COMMUNITY): Payer: No Typology Code available for payment source | Admitting: Physical Therapy

## 2018-12-21 ENCOUNTER — Inpatient Hospital Stay (HOSPITAL_COMMUNITY): Payer: No Typology Code available for payment source

## 2018-12-21 LAB — CBC WITH DIFFERENTIAL/PLATELET
Abs Immature Granulocytes: 0.01 10*3/uL (ref 0.00–0.07)
Basophils Absolute: 0 10*3/uL (ref 0.0–0.1)
Basophils Relative: 1 %
EOS ABS: 0.2 10*3/uL (ref 0.0–0.5)
EOS PCT: 5 %
HCT: 34.8 % — ABNORMAL LOW (ref 39.0–52.0)
Hemoglobin: 10.6 g/dL — ABNORMAL LOW (ref 13.0–17.0)
Immature Granulocytes: 0 %
Lymphocytes Relative: 27 %
Lymphs Abs: 1.1 10*3/uL (ref 0.7–4.0)
MCH: 23.9 pg — ABNORMAL LOW (ref 26.0–34.0)
MCHC: 30.5 g/dL (ref 30.0–36.0)
MCV: 78.6 fL — ABNORMAL LOW (ref 80.0–100.0)
MONO ABS: 0.4 10*3/uL (ref 0.1–1.0)
Monocytes Relative: 9 %
Neutro Abs: 2.3 10*3/uL (ref 1.7–7.7)
Neutrophils Relative %: 58 %
PLATELETS: 298 10*3/uL (ref 150–400)
RBC: 4.43 MIL/uL (ref 4.22–5.81)
RDW: 16 % — AB (ref 11.5–15.5)
WBC: 3.9 10*3/uL — ABNORMAL LOW (ref 4.0–10.5)
nRBC: 0 % (ref 0.0–0.2)

## 2018-12-21 LAB — BASIC METABOLIC PANEL
Anion gap: 6 (ref 5–15)
BUN: 17 mg/dL (ref 8–23)
CO2: 26 mmol/L (ref 22–32)
Calcium: 8.8 mg/dL — ABNORMAL LOW (ref 8.9–10.3)
Chloride: 104 mmol/L (ref 98–111)
Creatinine, Ser: 0.84 mg/dL (ref 0.61–1.24)
GFR calc Af Amer: >60 mL/min (ref >60)
GFR calc non Af Amer: >60 mL/min (ref >60)
Glucose, Bld: 91 mg/dL (ref 70–99)
Potassium: 3.9 mmol/L (ref 3.5–5.1)
Sodium: 136 mmol/L (ref 135–145)

## 2018-12-21 MED ORDER — POTASSIUM CHLORIDE CRYS ER 20 MEQ PO TBCR
20.0000 meq | EXTENDED_RELEASE_TABLET | Freq: Every day | ORAL | Status: DC
  Administered 2018-12-22 – 2018-12-25 (×4): 20 meq via ORAL
  Filled 2018-12-21 (×4): qty 1

## 2018-12-21 NOTE — Progress Notes (Signed)
[place] PHYSICAL MEDICINE & REHABILITATION PROGRESS NOTE   Subjective/Complaints: Pt without new complaints. OT about to start. Became very defensive when discussing a potential bowel program  ROS: Patient denies fever, rash, sore throat, blurred vision, nausea, vomiting, diarrhea, cough, shortness of breath or chest pain,   or mood change.   Objective:   No results found. Recent Labs    12/21/18 0935  WBC 3.9*  HGB 10.6*  HCT 34.8*  PLT 298   Recent Labs    12/21/18 0606  NA 136  K 3.9  CL 104  CO2 26  GLUCOSE 91  BUN 17  CREATININE 0.84  CALCIUM 8.8*    Intake/Output Summary (Last 24 hours) at 12/21/2018 1313 Last data filed at 12/21/2018 0900 Gross per 24 hour  Intake 240 ml  Output 2450 ml  Net -2210 ml     Physical Exam: Vital Signs Blood pressure 133/77, pulse 67, temperature 97.9 F (36.6 C), temperature source Oral, resp. rate 16, height 5\' 10"  (1.778 m), weight (!) 138.5 kg, SpO2 98 %.  Constitutional: No distress . Vital signs reviewed. HEENT: EOMI, oral membranes moist Neck: supple Cardiovascular: RRR without murmur. No JVD    Respiratory: CTA Bilaterally without wheezes or rales. Normal effort    GI: BS +, non-tender, non-distended  Musc: No edema or tenderness in extremities. Skin: Warm and dry.  Intact. Neurologic: Alert and oriented Motor: 5/5 in bilateral deltoid, bicep, tricep, grip  Right lower extremity: 3/5 proximal distal, stable Left lower extremity: 3--3/5 proximal to distal, stable Psych: Good spirits.  Does not affect.  Normal behavior.  Assessment/Plan: 1. Functional deficits secondary to cervical myelopathy which require 3+ hours per day of interdisciplinary therapy in a comprehensive inpatient rehab setting.  Physiatrist is providing close team supervision and 24 hour management of active medical problems listed below.  Physiatrist and rehab team continue to assess barriers to discharge/monitor patient progress toward  functional and medical goals  Care Tool:  Bathing  Bathing activity did not occur: Refused Body parts bathed by patient: Chest, Abdomen, Left upper leg, Right upper leg, Face, Right arm, Left arm, Right lower leg, Left lower leg   Body parts bathed by helper: Front perineal area, Buttocks     Bathing assist Assist Level: Moderate Assistance - Patient 50 - 74%     Upper Body Dressing/Undressing Upper body dressing   What is the patient wearing?: Pull over shirt    Upper body assist Assist Level: Minimal Assistance - Patient > 75%    Lower Body Dressing/Undressing Lower body dressing      What is the patient wearing?: Pants     Lower body assist Assist for lower body dressing: Moderate Assistance - Patient 50 - 74%     Toileting Toileting    Toileting assist Assist for toileting: Total Assistance - Patient < 25%     Transfers Chair/bed transfer  Transfers assist  Chair/bed transfer activity did not occur: Safety/medical concerns  Chair/bed transfer assist level: Moderate Assistance - Patient 50 - 74%(slideboard)     Locomotion Ambulation   Ambulation assist   Ambulation activity did not occur: Safety/medical concerns          Walk 10 feet activity   Assist  Walk 10 feet activity did not occur: Safety/medical concerns        Walk 50 feet activity   Assist Walk 50 feet with 2 turns activity did not occur: Safety/medical concerns  Walk 150 feet activity   Assist Walk 150 feet activity did not occur: Safety/medical concerns         Walk 10 feet on uneven surface  activity   Assist Walk 10 feet on uneven surfaces activity did not occur: Safety/medical concerns         Wheelchair     Assist Will patient use wheelchair at discharge?: Yes Type of Wheelchair: Manual Wheelchair activity did not occur: Safety/medical concerns  Wheelchair assist level: Supervision/Verbal cueing Max wheelchair distance: 100'     Wheelchair 50 feet with 2 turns activity    Assist    Wheelchair 50 feet with 2 turns activity did not occur: Safety/medical concerns   Assist Level: Supervision/Verbal cueing   Wheelchair 150 feet activity     Assist Wheelchair 150 feet activity did not occur: Safety/medical concerns   Assist Level: Supervision/Verbal cueing    Medical Problem List and Plan: 1. tetraparesis secondary to cervical myelopathy s/c C3-6 ACDF   Continue CIR PT, OT 2. Antithrombotics: -DVT/anticoagulation:Mechanical:Antiembolism stockings, knee (TED hose) Bilateral lower extremities Sequential compression devices, below kneeBilateral lower extremities -antiplatelet therapy: N/A 3. Pain Management:On Cymbalta daily. Continue hydrocodone prn  -improved left pain after injection 3/4 4. Mood:LCSW to follow for evaluation and support. -antipsychotic agents: N/A 5. Neuropsych: This patientiscapable of making decisions on hisown behalf. 6. Skin/Wound Care:Routine pressure relief measures. 7. Fluids/Electrolytes/Nutrition:Monitor I/O.   Potassium 3.9 today 3.9  -pt is eating more bananas and fruit  -Changed kdur to daily only  -will reduce kdur to 8. Urinary retention/BPH: Question plans for TURP. Continue Flomax.  - consider voiding trial soon---urology to see during admit at some point 9. Anemia of chronic disease: On iron for supplement.  Hemoglobin 9.5 on 3/3--- up to 10.6 today 10. Leukopenia: resolved.  11. Morbid obesity: Encourage appropriate diet and weight loss to help promote mobility and health. 12. Neurogenic bowel: pt refuses to admit he's incontinent or be willing to work toward scheduled bowel program. In total denial. Became very angry during discussion today despite education efforts  LOS: 11 days A FACE TO FACE EVALUATION WAS PERFORMED  Ranelle Oyster 12/21/2018, 1:13 PM

## 2018-12-21 NOTE — Progress Notes (Signed)
Physical Therapy Session Note  Patient Details  Name: [name] [name] MRN: [id number] Date of Birth: 1956-04-05  Today's Date: 12/21/2018 PT Individual Time: 1436-1500 PT Individual Time Calculation (min): 24 min   Short Term Goals: Week 2:  PT Short Term Goal 1 (Week 2): Pt will complete slide board transfer with mod A consistently PT Short Term Goal 2 (Week 2): Pt will stand with mod A consistently with LRAD PT Short Term Goal 3 (Week 2): Pt will propel manual w/c x 150 ft with use of BUE and Supervision  Skilled Therapeutic Interventions/Progress Updates:  Pt received in w/c & agreeable to tx. Pt reports unrated soreness in B shoulders & reports being premedicated. Pt propels w/c room<>gym with BUE & LLE with supervision. In gym pt performs BLE long arc quads, BUE chest press within available ROM with 3# weighted bar, RUE bicep curls with 4# dumbbell and B seated hip flexion for extremity strengthening. At end of session pt left sitting in w/c with all needs in reach.   Therapy Documentation Precautions:  Precautions Precautions: Cervical, Fall Precaution Comments: reviewed cervical precautions with patient, min cueing to recall   Required Braces or Orthoses: Cervical Brace Cervical Brace: Soft collar Restrictions Weight Bearing Restrictions: No    Therapy/Group: Individual Therapy  Sandi Mariscal 12/21/2018, 3:41 PM

## 2018-12-21 NOTE — Progress Notes (Signed)
Physical Therapy Session Note  Patient Details  Name: [name] [name] MRN: [id number] Date of Birth: 21-Apr-1956  Today's Date: 12/21/2018 PT Individual Time: 1030-1058 PT Individual Time Calculation (min): 28 min   Short Term Goals: Week 2:  PT Short Term Goal 1 (Week 2): Pt will complete slide board transfer with mod A consistently PT Short Term Goal 2 (Week 2): Pt will stand with mod A consistently with LRAD PT Short Term Goal 3 (Week 2): Pt will propel manual w/c x 150 ft with use of BUE and Supervision  Skilled Therapeutic Interventions/Progress Updates:    Pt seated in w/c upon PT arrival, agreeable to therapy tx and denies pain at rest. Pt transported to the gym and performed slideboard transfer to mat mod assist, verbal cues for techniques. Pt performed sit<>stand from mat within [place] max assist, performed x 5 sit<>stands this session from [place] seat with max assist, able to hold standing for bouts 15-25 sec. Pt seated edge of mat performed x 10 LAQ each. Pt performed slideboard transfer back to w/c min assist and transported back to room. Pt left in w/c with needs in reach and chair alarm set.   Therapy Documentation Precautions:  Precautions Precautions: Cervical, Fall Precaution Comments: reviewed cervical precautions with patient, min cueing to recall   Required Braces or Orthoses: Cervical Brace Cervical Brace: Soft collar Restrictions Weight Bearing Restrictions: No    Therapy/Group: Individual Therapy  Cresenciano Genre, PT, DPT 12/21/2018, 7:51 AM

## 2018-12-21 NOTE — Progress Notes (Signed)
Occupational Therapy Session Note  Patient Details  Name: [name] [name] MRN: [id number] Date of Birth: 10-19-1955  Today's Date: 12/21/2018 OT Individual Time: [phone] and 8329-1916 OT Individual Time Calculation (min): 92 min and 48 min  Short Term Goals: Week 2:  OT Short Term Goal 1 (Week 2): STGs=LTGs due to ELOS  Skilled Therapeutic Interventions/Progress Updates:    Pt greeted in bed with no c/o pain and RN present. Opting to complete bathing w/c level at sink vs shower. Supine<sit completed with Min A for L LE placement with HOB elevated today! Sit<stand from elevated bed completed with steady assist using Geralyn Corwin. Pt stood in semi perched position in [place] at [place] while completing UB bathing and perihygiene. Able to tolerate supported standing for ~23 minutes with occasional LE adjustments due to heels sliding off of platform. Steady assist sit<stand from perched position with pt able to stand for 3 minutes while OT completed perihygiene post incontinent BM. Pt able to assist with perihygiene completion in the front but required A for thoroughness. He finished LE bathing using LH sponge while seated in w/c. UB dressing completed with Min A and vcs for adaptive techniques. LB dressing completed with use of AE and instruction with assist for Teds and catheter mgt. Max A sit<stand in [place] from low w/c to elevate pants over hips. Due to Lt shoulder pain, he completed oral care and handwashing w/c level. Near end of session, guided him through gentle shoulder stretches and isometric exercises for L UE to address pain. RN also in during session to provide medication, per pt request. At end of session pt was left in w/c with all needs within reach. Tx focus on sit<stands, trunk strengthening, and standing endurance/balance.   2nd Session 1:1 tx (48 min) Pt greeted in w/c and ready to go. Started session with sit<partial stands when w/c was placed at bed, legs braced by bed. Pt initially able to  clear 1/4 buttocks from chair, progressing to clearing 3/4 buttocks with assist for only steadying equipment (B UEs on armrests). Able to hold elevated position for 3-7 second windows before needing to sit.  Discussed with pt concept of strength building in midranges to improve functional sit<stand and he verbalized understanding. Pt then requested to use BSC. Sit<stand in [place] completed with Max A from low w/c. Steady assist for sit<stands from perched Stedy positions for OT to complete clothing mgt and hygiene. Pt with continent BM!  After hand hygiene, pt returned to sitting in w/c. Pt left with all needs within reach.   Therapy Documentation Precautions:  Precautions Precautions: Cervical, Fall Precaution Comments: reviewed cervical precautions with patient, min cueing to recall   Required Braces or Orthoses: Cervical Brace Cervical Brace: Soft collar Restrictions Weight Bearing Restrictions: No Pain: Pain Assessment Pain Scale: 0-10 Pain Score: 2  Pain Type: Acute pain Pain Location: Neck Pain Orientation: Right;Left;Posterior Pain Radiating Towards: shoulders Pain Descriptors / Indicators: Aching Pain Frequency: Constant Pain Onset: On-going Patients Stated Pain Goal: 0 Pain Intervention(s): Medication (See eMAR) ADL: ADL Grooming: Supervision/safety Where Assessed-Grooming: Other (comment)(standing in [place]) Upper Body Bathing: Supervision/safety Where Assessed-Upper Body Bathing: Other (Comment)(semi perched in [place]) Lower Body Bathing: Moderate assistance Where Assessed-Lower Body Bathing: Standing at sink, Sitting at sink(using Bariatric Stedy) Upper Body Dressing: Moderate assistance Where Assessed-Upper Body Dressing: Sitting at sink Lower Body Dressing: Moderate assistance Where Assessed-Lower Body Dressing: Wheelchair, Standing at sink, Sitting at sink(using Geralyn Corwin) Toileting: Other (Comment)(Sit<stand in [place]- 2 assist) Toilet Transfer: Other  (  comment)(2 assist) Toilet Transfer Method: Stand pivot Toilet Transfer Equipment: Bedside commode Tub/Shower Transfer: Not assessed      Therapy/Group: Individual Therapy  Raymundo Rout A Zykera Abella 12/21/2018, 12:47 PM

## 2018-12-22 ENCOUNTER — Inpatient Hospital Stay (HOSPITAL_COMMUNITY): Payer: No Typology Code available for payment source | Admitting: Physical Therapy

## 2018-12-22 ENCOUNTER — Inpatient Hospital Stay (HOSPITAL_COMMUNITY): Payer: No Typology Code available for payment source

## 2018-12-22 NOTE — Progress Notes (Signed)
Occupational Therapy Session Note  Patient Details  Name: [name] [name] MRN: [id number] Date of Birth: 01/27/1956  Today's Date: 12/22/2018 OT Individual Time: 1100-1200 OT Individual Time Calculation (min): 60 min    Short Term Goals: Week 2:  OT Short Term Goal 1 (Week 2): STGs=LTGs due to ELOS  Skilled Therapeutic Interventions/Progress Updates:    Pt resting in w/c upon arrival.  Pt declined bathing/dressing this morning, stating he was "good to go." Pt propelled w/c to nurses station.  Pt transitioned to gym and performed slide board transfer to mat with min A after placement of board. Pt initially engaged in sit<>stand from [place] with bariatric Stedy (3X5). Pt transitioned to practicing lateral leans to facilitate placement of sliding board.  Pt practiced initial segment of sit<>stand with arms placed on chair in front and pushing through BLE and BUE to raise buttocks approx 3" off mat.  Pt performed 3X8. Pt also stood in [place] for 41 seconds with significant BUE support.  Pt attempted standing again but only able to stand 11 seconds.  Pt transferred back to w/c with [facility] and returned to room.  Pt remained in w/c with all needs within reach.   Therapy Documentation Precautions:  Precautions Precautions: Cervical, Fall Precaution Comments: reviewed cervical precautions with patient, min cueing to recall   Required Braces or Orthoses: Cervical Brace Cervical Brace: Soft collar Restrictions Weight Bearing Restrictions: No  Pain: Pain Assessment Pain Scale: 0-10 Pain Score: 4, B shoulders Pain Intervention(s): repositioned, meds admin prior to therapy  Therapy/Group: Individual Therapy  [name] 12/22/2018, 12:10 PM

## 2018-12-22 NOTE — Progress Notes (Signed)
Physical Therapy Session Note  Patient Details  Name: [name] [name] MRN: [id number] Date of Birth: January 11, 1956  Today's Date: 12/22/2018 PT Individual Time: 4098-1191 PT Individual Time Calculation (min): 69 min   Short Term Goals: Week 2:  PT Short Term Goal 1 (Week 2): Pt will complete slide board transfer with mod A consistently PT Short Term Goal 2 (Week 2): Pt will stand with mod A consistently with LRAD PT Short Term Goal 3 (Week 2): Pt will propel manual w/c x 150 ft with use of BUE and Supervision  Skilled Therapeutic Interventions/Progress Updates:  Pt received in w/c & agreeable to tx. Pt without soft collar donned and therapist provided education & assisted with donning it. Pt propels w/c room>dayroom with supervision and RUE/LLE. Pt transitioned to standing in standing frame & tolerated it for 22 minutes while engaging [place] checkers game to focus on BLE strengthening & weight bearing and RUE fine motor control; pt with difficulty fully extending B hips & knees in standing 2/2 weakness. Pt transferred w/c>nu-step via slide board with mod assist + 2nd person to stabilize board with cuing for head/hips relationship to increase ease of scooting and maintain buttocks back on board. Pt utilized nu-step on level 4 x 10 minutes with BLE only, reporting 8/10 RPE with task focusing on BLE strengthening & cardiopulmonary endurance training.  Pt transferred nu-step>w/c with mod assist +1 2/2 transitioning to lower surface. Pt propels w/c back to room & left sitting in w/c with wife present to supervise & needs at hand.   Pt denies pain during session, reports receiving pain meds prior to session.   Therapy Documentation Precautions:  Precautions Precautions: Cervical, Fall Precaution Comments: reviewed cervical precautions with patient, min cueing to recall   Required Braces or Orthoses: Cervical Brace Cervical Brace: Soft collar Restrictions Weight Bearing Restrictions:  No   Therapy/Group: Individual Therapy  Sandi Mariscal 12/22/2018, 3:35 PM

## 2018-12-22 NOTE — Progress Notes (Signed)
[facility] PHYSICAL MEDICINE & REHABILITATION PROGRESS NOTE   Subjective/Complaints: Patient states he had a good night.  Pain under reasonable control.  Apologized for his behavior yesterday with me regarding his bowel program.  ROS: Patient denies fever, rash, sore throat, blurred vision, nausea, vomiting, diarrhea, cough, shortness of breath or chest pain, , headache, or mood change.    Objective:   No results found. Recent Labs    12/21/18 0935  WBC 3.9*  HGB 10.6*  HCT 34.8*  PLT 298   Recent Labs    12/21/18 0606  NA 136  K 3.9  CL 104  CO2 26  GLUCOSE 91  BUN 17  CREATININE 0.84  CALCIUM 8.8*    Intake/Output Summary (Last 24 hours) at 12/22/2018 0859 Last data filed at 12/22/2018 0527 Gross per 24 hour  Intake 360 ml  Output 1300 ml  Net -940 ml     Physical Exam: Vital Signs Blood pressure (!) 145/89, pulse 77, temperature 98 F (36.7 C), temperature source Oral, resp. rate 18, height 5\' 10"  (1.778 m), weight (!) 138.5 kg, SpO2 100 %.  Constitutional: No distress . Vital signs reviewed.  Obese HEENT: EOMI, oral membranes moist Neck: supple Cardiovascular: RRR without murmur. No JVD    Respiratory: CTA Bilaterally without wheezes or rales. Normal effort    GI: BS +, non-tender, non-distended  Musc: No edema or tenderness in extremities. Skin: Warm and dry.  Intact. Neurologic: Alert and oriented Motor: 5/5 in bilateral deltoid, bicep, tricep, grip  Right lower extremity: 3/5 proximal distal, stable examination left lower extremity: 3--3/5 proximal to distal, stable examination Psych: Good spirits.  Does not affect.  Normal behavior.  Assessment/Plan: 1. Functional deficits secondary to cervical myelopathy which require 3+ hours per day of interdisciplinary therapy in a comprehensive inpatient rehab setting.  Physiatrist is providing close team supervision and 24 hour management of active medical problems listed below.  Physiatrist and rehab  team continue to assess barriers to discharge/monitor patient progress toward functional and medical goals  Care Tool:  Bathing  Bathing activity did not occur: Refused Body parts bathed by patient: Chest, Abdomen, Left upper leg, Right upper leg, Face, Right arm, Left arm, Right lower leg, Left lower leg   Body parts bathed by helper: Front perineal area, Buttocks     Bathing assist Assist Level: Moderate Assistance - Patient 50 - 74%     Upper Body Dressing/Undressing Upper body dressing   What is the patient wearing?: Pull over shirt    Upper body assist Assist Level: Minimal Assistance - Patient > 75%    Lower Body Dressing/Undressing Lower body dressing      What is the patient wearing?: Pants     Lower body assist Assist for lower body dressing: Moderate Assistance - Patient 50 - 74%     Toileting Toileting    Toileting assist Assist for toileting: Total Assistance - Patient < 25%     Transfers Chair/bed transfer  Transfers assist  Chair/bed transfer activity did not occur: Safety/medical concerns  Chair/bed transfer assist level: Moderate Assistance - Patient 50 - 74%(slideboard)     Locomotion Ambulation   Ambulation assist   Ambulation activity did not occur: Safety/medical concerns          Walk 10 feet activity   Assist  Walk 10 feet activity did not occur: Safety/medical concerns        Walk 50 feet activity   Assist Walk 50 feet with 2 turns activity  did not occur: Safety/medical concerns         Walk 150 feet activity   Assist Walk 150 feet activity did not occur: Safety/medical concerns         Walk 10 feet on uneven surface  activity   Assist Walk 10 feet on uneven surfaces activity did not occur: Safety/medical concerns         Wheelchair     Assist Will patient use wheelchair at discharge?: Yes Type of Wheelchair: Manual Wheelchair activity did not occur: Safety/medical concerns  Wheelchair assist  level: Supervision/Verbal cueing Max wheelchair distance: 100'    Wheelchair 50 feet with 2 turns activity    Assist    Wheelchair 50 feet with 2 turns activity did not occur: Safety/medical concerns   Assist Level: Supervision/Verbal cueing   Wheelchair 150 feet activity     Assist Wheelchair 150 feet activity did not occur: Safety/medical concerns   Assist Level: Supervision/Verbal cueing    Medical Problem List and Plan: 1. tetraparesis secondary to cervical myelopathy s/c C3-6 ACDF   Continue CIR PT, OT, team conference today 2. Antithrombotics: -DVT/anticoagulation:Mechanical:Antiembolism stockings, knee (TED hose) Bilateral lower extremities Sequential compression devices, below kneeBilateral lower extremities -antiplatelet therapy: N/A 3. Pain Management:On Cymbalta daily. Continue hydrocodone prn  -improved left pain after injection 3/4 4. Mood:LCSW to follow for evaluation and support. -antipsychotic agents: N/A 5. Neuropsych: This patientiscapable of making decisions on hisown behalf. 6. Skin/Wound Care:Routine pressure relief measures. 7. Fluids/Electrolytes/Nutrition:Monitor I/O.   Potassium 3.9 today 3.9  -pt is eating more bananas and fruit    -Continue kdur daily 8. Urinary retention/BPH: Question plans for TURP. Continue Flomax.  - consider voiding trial soon---urology to see during admit at some point.  Due for a Foley changed out soon 9. Anemia of chronic disease: On iron for supplement.  Hemoglobin 9.5 on 3/3--- up to 10.6 3/9 10. Leukopenia: resolved.  11. Morbid obesity: Encourage appropriate diet and weight loss to help promote mobility and health. 12. Neurogenic bowel: Patient states he is willing to work on bowel program after our discussion today.  Discussed the medical rationale for this, and he seemed to understand and agree  LOS: 12 days A FACE TO FACE EVALUATION WAS PERFORMED  Ranelle Oyster 12/22/2018, 8:59 AM

## 2018-12-22 NOTE — Progress Notes (Signed)
Physical Therapy Session Note  Patient Details  Name: [name] [name] MRN: [id number] Date of Birth: 28-Jan-1956  Today's Date: 12/22/2018 PT Individual Time: 0802-0900 PT Individual Time Calculation (min): 58 min   Short Term Goals: Week 2:  PT Short Term Goal 1 (Week 2): Pt will complete slide board transfer with mod A consistently PT Short Term Goal 2 (Week 2): Pt will stand with mod A consistently with LRAD PT Short Term Goal 3 (Week 2): Pt will propel manual w/c x 150 ft with use of BUE and Supervision  Skilled Therapeutic Interventions/Progress Updates:    Pt received supine in bed and eager to participate with therapy. Pt reported being incontinent of bowels and performed roling R and L using bedrails with min A for LE management and cuing for increased independence with task. Total A for pericare and donning brief. Therapist looped B LEs in shorts with mod A for LE management during bridging to complete dressing. Pt performed supine to sit with mod A for trunk upright and HOB slightly elevated. Pt reported increased posterior neck and L shoulder pain of 8/10 with RN notified and present for medication administration. Pt performed transfers bed>wheelchair<>mat table with assist for transfer board placement and mod A for scooting - pt demonstrated proper wheelchair set-up for transfer with min cuing. Pt performed repeated sit<>stand x4 using stedy with max A of 1-2 and manual facilitation for hip and knee extension. Pt tolerated standing in stedy for 10-20seconds with manual facilitation for trunk and hip extension for improved upright posture. Pt returned to room sitting in wheelchair with needs in reach.   Therapy Documentation Precautions:  Precautions Precautions: Cervical, Fall Precaution Comments: reviewed cervical precautions with patient, min cueing to recall   Required Braces or Orthoses: Cervical Brace Cervical Brace: Soft collar Restrictions Weight Bearing Restrictions:  No   Therapy/Group: Individual Therapy  Cresenciano Genre, PT ,DPT  Casimiro Needle, PT, DPT 12/22/2018, 9:44 AM

## 2018-12-23 ENCOUNTER — Inpatient Hospital Stay (HOSPITAL_COMMUNITY): Payer: No Typology Code available for payment source | Admitting: Occupational Therapy

## 2018-12-23 ENCOUNTER — Encounter (HOSPITAL_COMMUNITY): Payer: No Typology Code available for payment source | Admitting: Psychology

## 2018-12-23 ENCOUNTER — Inpatient Hospital Stay (HOSPITAL_COMMUNITY): Payer: No Typology Code available for payment source

## 2018-12-23 ENCOUNTER — Inpatient Hospital Stay (HOSPITAL_COMMUNITY): Payer: No Typology Code available for payment source | Admitting: Physical Therapy

## 2018-12-23 NOTE — Consult Note (Signed)
Neuropsychological Consultation   Patient:   [name] [name]   DOB:   06-09-1956  MR Number:  [id number]  Location:  [facility] [address] CENTER A [address] [id number] [place] [address] Dept: [phone] Loc: [phone]           Date of Service:   12/23/2018  Start Time:   2 PM End Time:   3 PM  Provider/Observer:  Arley Phenix, Psy.D.       Clinical Neuropsychologist       Billing Code/Service: [id number]  Chief Complaint:    [name] [name] is a 63 year old male with history of CKD, HTN, morbid obesity, BPH-h/o retention, lumbar decompression in 11/2017 complicated by post operative infection and septic knee with CIR stay, neck pain with progressive BUE/BLE weakness and inability to walk due to severe cervical stenosis C3-6 with radiculopathy.  Patient was admitted on 12/07/2018 for ACDF C3-6 by Dr. Jordan Likes  Patient with significant debility with myelopathy and has had HHPT/HHOT.  Patient is coping with extended hospital/surgical course.    Reason for Service:  Patient was referred for neuropsychological consultation due to coping and adjustment issues.  Below is the HPI for the current admission.  [id number] Tubridy is a 63 year old male with history of CKD, HTN, morbid obesity- BMI- 43, BPH-h/o retention, lumbar decompression 11/2017 complicated by post op infection and septic knee with CIR stay 07/2018, neck pain with progressive BUE/BLE weakness with inability to walk due to severe cervical stenosis C3-C6 with radiculopathy. Back wound healed with infection resolved and patient admitted on 12/07/18 for ACDF C3-C6 by Dr. Jordan Likes. Patient with significant debility with myelopathy and has had HHPT/HHOT ongoing and attempting SB transfers. He continues to have deficits in mobility and self care tasks. CIR recommended for follow up therapy.  Current Status:  Patient reports that he is starting to feel better with regard to pain.  He  reports that he was getting weaker even prior to surgeries and that he knows he is deconditioned and moving around would be with risk of falling.  Motivated verbally to fully participate in therapies.    Behavioral Observation: [name]  presents as a 63 y.o.-year-old Right African American Male who appeared his stated age. his dress was Appropriate and he was Well Groomed and his manners were Appropriate to the situation.  his participation was indicative of Appropriate and Attentive behaviors.  There were any physical disabilities noted.  he displayed an appropriate level of cooperation and motivation.     Interactions:    Active Appropriate and Attentive  Attention:   within normal limits and attention span and concentration were age appropriate  Memory:   within normal limits; recent and remote memory intact  Visuo-spatial:  not examined  Speech (Volume):  normal  Speech:   normal; normal  Thought Process:  Coherent and Relevant  Though Content:  WNL; not suicidal and not homicidal  Orientation:   person, place, time/date and situation  Judgment:   Fair  Planning:   Good  Affect:    Appropriate  Mood:    Dysphoric  Insight:   Good  Intelligence:   normal  Medical History:   Past Medical History:  Diagnosis Date  . Anemia   . Arthritis   . Chronic kidney disease    HX acute kidney failure / acute pyelonephritis / hydronephrosis / severe sepsis per discharge summary 12/27/13  . GERD (gastroesophageal reflux disease)   .  History of kidney stones   . Hypertension   . Morbid obesity (HCC)   . Obstructive sleep apnea    does not need c pap since 110 lb wt loss  . Osteoporosis   . Prurigo 2002  . Scars    ON ARMS FROM CHEMICAL EXPLOSION 1999  . Spinal stenosis    Psychiatric History:  Patient denies past psychiatric history.    Family Med/Psych History: History reviewed. No pertinent family history.  Risk of Suicide/Violence: low Patient denies SI or  HI.  Impression/DX:  [name] [name] is a 63 year old male with history of CKD, HTN, morbid obesity, BPH-h/o retention, lumbar decompression in 11/2017 complicated by post operative infection and septic knee with CIR stay, neck pain with progressive BUE/BLE weakness and inability to walk due to severe cervical stenosis C3-6 with radiculopathy.  Patient was admitted on 12/07/2018 for ACDF C3-6 by Dr. Jordan Likes  Patient with significant debility with myelopathy and has had HHPT/HHOT.  Patient is coping with extended hospital/surgical course.    Patient reports that he is starting to feel better with regard to pain.  He reports that he was getting weaker even prior to surgeries and that he knows he is deconditioned and moving around would be with risk of falling.  Motivated verbally to fully participate in therapies.   Disposition/Plan:  Will see the patient again next week if possible.    Diagnosis:    Status post lumbar laminectomy - Plan: Ambulatory referral to Physical Medicine Rehab         Electronically Signed   _______________________ Arley Phenix, Psy.D.

## 2018-12-23 NOTE — Progress Notes (Signed)
[facility] PHYSICAL MEDICINE & REHABILITATION PROGRESS NOTE   Subjective/Complaints: Patient states he had a good night.  Pain under reasonable control.  Apologized for his behavior yesterday with me regarding his bowel program.  ROS: Patient denies fever, rash, sore throat, blurred vision, nausea, vomiting, diarrhea, cough, shortness of breath or chest pain, , headache, or mood change.    Objective:   No results found. Recent Labs    12/21/18 0935  WBC 3.9*  HGB 10.6*  HCT 34.8*  PLT 298   Recent Labs    12/21/18 0606  NA 136  K 3.9  CL 104  CO2 26  GLUCOSE 91  BUN 17  CREATININE 0.84  CALCIUM 8.8*    Intake/Output Summary (Last 24 hours) at 12/23/2018 0920 Last data filed at 12/23/2018 0736 Gross per 24 hour  Intake 342 ml  Output 1700 ml  Net -1358 ml     Physical Exam: Vital Signs Blood pressure 110/72, pulse 71, temperature 98.3 F (36.8 C), temperature source Oral, resp. rate 18, height 5\' 10"  (1.778 m), weight (!) 139 kg, SpO2 100 %.  Constitutional: No distress . Vital signs reviewed.  Obese HEENT: EOMI, oral membranes moist Neck: supple Cardiovascular: RRR without murmur. No JVD    Respiratory: CTA Bilaterally without wheezes or rales. Normal effort    GI: BS +, non-tender, non-distended  Musc: No edema or tenderness in extremities. Skin: Warm and dry.  Intact. Neurologic: Alert and oriented Motor: 5/5 in bilateral deltoid, bicep, tricep, grip  Right lower extremity: 3/5 proximal distal, stable examination left lower extremity: 3--3/5 proximal to distal, stable examination Psych: Good spirits.  Does not affect.  Normal behavior.  Assessment/Plan: 1. Functional deficits secondary to cervical myelopathy which require 3+ hours per day of interdisciplinary therapy in a comprehensive inpatient rehab setting.  Physiatrist is providing close team supervision and 24 hour management of active medical problems listed below.  Physiatrist and rehab team  continue to assess barriers to discharge/monitor patient progress toward functional and medical goals  Care Tool:  Bathing  Bathing activity did not occur: Refused Body parts bathed by patient: Chest, Abdomen, Left upper leg, Right upper leg, Face, Right arm, Left arm, Right lower leg, Left lower leg   Body parts bathed by helper: Front perineal area, Buttocks     Bathing assist Assist Level: Moderate Assistance - Patient 50 - 74%     Upper Body Dressing/Undressing Upper body dressing   What is the patient wearing?: Pull over shirt    Upper body assist Assist Level: Minimal Assistance - Patient > 75%    Lower Body Dressing/Undressing Lower body dressing      What is the patient wearing?: Pants     Lower body assist Assist for lower body dressing: Maximal Assistance - Patient 25 - 49%     Toileting Toileting    Toileting assist Assist for toileting: Total Assistance - Patient < 25%     Transfers Chair/bed transfer  Transfers assist  Chair/bed transfer activity did not occur: Safety/medical concerns  Chair/bed transfer assist level: Minimal Assistance - Patient > 75%     Locomotion Ambulation   Ambulation assist   Ambulation activity did not occur: Safety/medical concerns          Walk 10 feet activity   Assist  Walk 10 feet activity did not occur: Safety/medical concerns        Walk 50 feet activity   Assist Walk 50 feet with 2 turns activity did not  occur: Safety/medical concerns         Walk 150 feet activity   Assist Walk 150 feet activity did not occur: Safety/medical concerns         Walk 10 feet on uneven surface  activity   Assist Walk 10 feet on uneven surfaces activity did not occur: Safety/medical concerns         Wheelchair     Assist Will patient use wheelchair at discharge?: Yes Type of Wheelchair: Manual Wheelchair activity did not occur: Safety/medical concerns  Wheelchair assist level:  Supervision/Verbal cueing Max wheelchair distance: 100'    Wheelchair 50 feet with 2 turns activity    Assist    Wheelchair 50 feet with 2 turns activity did not occur: Safety/medical concerns   Assist Level: Supervision/Verbal cueing   Wheelchair 150 feet activity     Assist Wheelchair 150 feet activity did not occur: Safety/medical concerns   Assist Level: Supervision/Verbal cueing    Medical Problem List and Plan: 1. Tetraparesis secondary to cervical stenosis myelopathy (c3-6)  s/p C3-6 ACDF  -hx of prior lumbar decompression    Continue CIR PT, OT, team conference today 2. Antithrombotics: -DVT/anticoagulation:Mechanical:Antiembolism stockings, knee (TED hose) Bilateral lower extremities Sequential compression devices, below kneeBilateral lower extremities -antiplatelet therapy: N/A 3. Pain Management:On Cymbalta daily. Continue hydrocodone prn  -improved left pain after injection 3/4 4. Mood:LCSW to follow for evaluation and support. -antipsychotic agents: N/A 5. Neuropsych: This patientiscapable of making decisions on hisown behalf. 6. Skin/Wound Care:Routine pressure relief measures. 7. Fluids/Electrolytes/Nutrition:Monitor I/O.   Potassium 3.9 today 3.9  -pt is eating more bananas and fruit  -Continue kdur daily---recheck friday 8. Urinary retention/BPH: Question plans for TURP. Continue Flomax.  - consider voiding trial soon---urology to see during admit at some point.  Due for a Foley changed out soon---will contact urology 9. Anemia of chronic disease: On iron for supplement.  Hemoglobin 9.5 on 3/3--- up to 10.6 3/9 10. Leukopenia: resolved.  11. Morbid obesity: Encourage appropriate diet and weight loss to help promote mobility and health. 12. Neurogenic bowel: working on re-establishing bowel program  -had bm this morning  -continue with plan LOS: 13 days A FACE TO FACE EVALUATION WAS  PERFORMED  [name] [name] 12/23/2018, 9:20 AM

## 2018-12-23 NOTE — Progress Notes (Signed)
Physical Therapy Session Note  Patient Details  Name: [name] [name] MRN: [id number] Date of Birth: 1955/12/14  Today's Date: 12/23/2018 PT Individual Time: 8101-7510 PT Individual Time Calculation (min): 40 min   Short Term Goals: Week 1:  PT Short Term Goal 1 (Week 1): Pt will complete rolling with mod A consistently PT Short Term Goal 1 - Progress (Week 1): Met PT Short Term Goal 2 (Week 1): Pt will complete least restrictive transfer with max A PT Short Term Goal 2 - Progress (Week 1): Met PT Short Term Goal 3 (Week 1): Pt will initiate w/c mobility PT Short Term Goal 3 - Progress (Week 1): Met Week 2:  PT Short Term Goal 1 (Week 2): Pt will complete slide board transfer with mod A consistently PT Short Term Goal 2 (Week 2): Pt will stand with mod A consistently with LRAD PT Short Term Goal 3 (Week 2): Pt will propel manual w/c x 150 ft with use of BUE and Supervision  Skilled Therapeutic Interventions/Progress Updates:   Pt received sitting in WC and agreeable to PT.   WC mobility x 171f to rehab gym with supervision assist, propelling WC with RUE, LLEand intermittently with RUE. Min cues from PT for doorway safety and improved LLE placement and ELR to improve safety.  PT instructed pt in seated therex with level 2 tband. LAQ, hip flexion, hip abduction, ankle DF, HS curls. All completed 2 x 8 with multiple rest breaks due to fatigue. Min cues for decreased speed of eccentric movement. Multiple rest breaks given due to fatigue.  Patient returned to room and [facility] and all needs met.           Therapy Documentation Precautions:  Precautions Precautions: Cervical, Fall Precaution Comments: reviewed cervical precautions with patient, min cueing to recall   Required Braces or Orthoses: Cervical Brace Cervical Brace: Soft collar Restrictions Weight Bearing Restrictions: No    Vital Signs: Therapy Vitals Temp: 98.1 F (36.7 C) Pulse  Rate: 83 BP: 92/66 Oxygen Therapy SpO2: 100 % O2 Device: Room Air Pain:    denies   Therapy/Group: Individual Therapy  ALorie Phenix3/08/2019, 5:00 PM

## 2018-12-23 NOTE — Progress Notes (Signed)
Occupational Therapy Session Note  Patient Details  Name: [name] [name] MRN: [id number] Date of Birth: 1956-03-31  Today's Date: 12/23/2018 OT Individual Time: 2703-5009 OT Individual Time Calculation (min): 74 min    Short Term Goals: Week 1:  OT Short Term Goal 1 (Week 1): Pt will complete 1/3 components of donning pants using AE as needed  OT Short Term Goal 1 - Progress (Week 1): Met OT Short Term Goal 2 (Week 1): Pt will complete bathing sit<stand with 1 assist  OT Short Term Goal 2 - Progress (Week 1): Met OT Short Term Goal 3 (Week 1): Pt will complete 1 grooming task while semi perched in Stedy to work on standing tolerance  OT Short Term Goal 3 - Progress (Week 1): Met Week 2:  OT Short Term Goal 1 (Week 2): STGs=LTGs due to ELOS  Skilled Therapeutic Interventions/Progress Updates:    Upon entering the room, pt supine in bed with no c/o pain and agreeable to OT intervention. Pt required assist to thread pants onto B feet. Pt rolling L <> R with min A to pull pants over B hips. Supine >sit with min A for trunk elevation to EOB. Pt unable to place slide board with L UE without assistance this session. Slide board with min A and min verbal cuing for technique and placement as pt initially coming too far forward on board. Pt propelled wheelchair to sink for UB bathing and dressing.Pt required assist to wash under arms and back but able to perform majority himself. Pt donning pull over shirt with cuing for technique to pull fully up UEs and assist to pull over head. OT assisted pt to gym for B UE strengthening exercises with use of 3 lbs resistance hand weight. Pt needing to use R UE to help L UE with weighted exercises. Pt performed 3 sets of 15 bicep curls and punches from chest with rest breaks as needed. Pt propelling wheelchair in bouts of 50' back to room and resting as needed with supervision. Call bell and all needed items within reach upon exiting the room.   Therapy  Documentation Precautions:  Precautions Precautions: Cervical, Fall Precaution Comments: reviewed cervical precautions with patient, min cueing to recall   Required Braces or Orthoses: Cervical Brace Cervical Brace: Soft collar Restrictions Weight Bearing Restrictions: No General:   Vital Signs: Therapy Vitals Temp: 98.3 F (36.8 C) Temp Source: Oral Pulse Rate: 71 Resp: 18 BP: 110/72 Patient Position (if appropriate): Lying Oxygen Therapy SpO2: 100 % O2 Device: Room Air Pain:   ADL: ADL Grooming: Supervision/safety Where Assessed-Grooming: Other (comment)(standing in [place]) Upper Body Bathing: Supervision/safety Where Assessed-Upper Body Bathing: Other (Comment)(semi perched in [place]) Lower Body Bathing: Moderate assistance Where Assessed-Lower Body Bathing: Standing at sink, Sitting at sink(using Bariatric Stedy) Upper Body Dressing: Moderate assistance Where Assessed-Upper Body Dressing: Sitting at sink Lower Body Dressing: Moderate assistance Where Assessed-Lower Body Dressing: Wheelchair, Standing at sink, Sitting at sink(using Jeralene Huff) Toileting: Other (Comment)(Sit<stand in [place]- 2 assist) Toilet Transfer: Other (comment)(2 assist) Toilet Transfer Method: Stand pivot Toilet Transfer Equipment: Bedside commode Tub/Shower Transfer: Not assessed Vision   Perception    Praxis   Exercises:   Other Treatments:     Therapy/Group: Individual Therapy  Gypsy Decant 12/23/2018, 9:05 AM

## 2018-12-23 NOTE — Progress Notes (Signed)
Physical Therapy Session Note  Patient Details  Name: [name] [name] MRN: [id number] Date of Birth: 1956/01/24  Today's Date: 12/23/2018 PT Individual Time: 0905-1000 PT Individual Time Calculation (min): 55 min   Short Term Goals: Week 2:  PT Short Term Goal 1 (Week 2): Pt will complete slide board transfer with mod A consistently PT Short Term Goal 2 (Week 2): Pt will stand with mod A consistently with LRAD PT Short Term Goal 3 (Week 2): Pt will propel manual w/c x 150 ft with use of BUE and Supervision  Skilled Therapeutic Interventions/Progress Updates: pt presented in w/c agreeable to therapy. Pt stating some soreness in B shoulders/neck but states was recently premedicated. Pt agreeable to use standing frame today again to increase tolerance of wt bearing through BLE. Pt tolerated standing frame x 27 min with pt attempting to bring knees to full extension however unable at this time. Pt was cued intermittently to increase anterior lean. Pt participated in various mini games while in standing frame. Once pt returned to w/c pt propelled w/c with use of BLE for hamstring strengthening with intermittent use of BUE for turns. Pt returned to room at end of session and left with call bell within reach and needs met.      Therapy Documentation Precautions:  Precautions Precautions: Cervical, Fall Precaution Comments: reviewed cervical precautions with patient, min cueing to recall   Required Braces or Orthoses: Cervical Brace Cervical Brace: Soft collar Restrictions Weight Bearing Restrictions: No General:   Vital Signs: Therapy Vitals Temp: 98.1 F (36.7 C) Pulse Rate: 83 BP: 92/66 Oxygen Therapy SpO2: 100 % O2 Device: Room Air   Therapy/Group: Individual Therapy  Soraida Vickers  Jamy Cleckler, PTA  12/23/2018, 3:59 PM

## 2018-12-23 NOTE — Progress Notes (Signed)
Physical Therapy Session Note  Patient Details  Name: [name] [name] MRN: [id number] Date of Birth: [birth year]  Today's Date: 12/23/2018 PT Individual Time: 1050-1120 PT Individual Time Calculation (min): 30 min   Short Term Goals: Week 2:  PT Short Term Goal 1 (Week 2): Pt will complete slide board transfer with mod A consistently PT Short Term Goal 2 (Week 2): Pt will stand with mod A consistently with LRAD PT Short Term Goal 3 (Week 2): Pt will propel manual w/c x 150 ft with use of BUE and Supervision  Skilled Therapeutic Interventions/Progress Updates:    Patient in w/c in room, propelled with S x about 100' on the way to the gym then assisted rest of distance.  Patient performed slide board transfer to mat with mod A and increased time, max cues throughout due for hand on board to prevent it from moving, to lean forward and to opposite side to which transferring and assist for board placement, repositioning feet and getting hips across board.  Sit to stand into stedy x 3 with A and momentum up from elevated mat.  Patient able to stand up to 40 seconds.  Slide board back to chair min A (downhill).  Assisted in w/c back to room and left with needs in reach.   Therapy Documentation Precautions:  Precautions Precautions: Cervical, Fall Precaution Comments: reviewed cervical precautions with patient, min cueing to recall   Required Braces or Orthoses: Cervical Brace Cervical Brace: Soft collar Restrictions Weight Bearing Restrictions: No Pain: Pain Assessment Pain Scale: 0-10 Pain Score: 2  Faces Pain Scale: Hurts a little bit Pain Type: Chronic pain Pain Location: Shoulder Pain Orientation: Left Pain Descriptors / Indicators: Aching Pain Intervention(s): Repositioned    Therapy/Group: Individual Therapy  Elray Mcgregor  Sheran Lawless, PT 12/23/2018, 11:53 AM

## 2018-12-24 ENCOUNTER — Inpatient Hospital Stay (HOSPITAL_COMMUNITY): Payer: No Typology Code available for payment source | Admitting: Occupational Therapy

## 2018-12-24 ENCOUNTER — Inpatient Hospital Stay (HOSPITAL_COMMUNITY): Payer: No Typology Code available for payment source

## 2018-12-24 ENCOUNTER — Inpatient Hospital Stay (HOSPITAL_COMMUNITY): Payer: No Typology Code available for payment source | Admitting: Physical Therapy

## 2018-12-24 MED ORDER — SENNOSIDES-DOCUSATE SODIUM 8.6-50 MG PO TABS
2.0000 | ORAL_TABLET | Freq: Every day | ORAL | Status: DC
  Administered 2018-12-24 – 2018-12-28 (×4): 2 via ORAL
  Filled 2018-12-24 (×5): qty 2

## 2018-12-24 MED ORDER — LIDOCAINE HCL URETHRAL/MUCOSAL 2 % EX GEL
1.0000 "application " | Freq: Once | CUTANEOUS | Status: DC
  Filled 2018-12-24: qty 5

## 2018-12-24 MED ORDER — GUAIFENESIN-DM 100-10 MG/5ML PO SYRP
5.0000 mL | ORAL_SOLUTION | ORAL | Status: DC | PRN
  Administered 2018-12-24 – 2018-12-27 (×7): 5 mL via ORAL
  Filled 2018-12-24 (×7): qty 5

## 2018-12-24 MED ORDER — CLONIDINE HCL 0.1 MG PO TABS
0.1000 mg | ORAL_TABLET | Freq: Once | ORAL | Status: AC
  Administered 2018-12-24: 0.1 mg via ORAL
  Filled 2018-12-24: qty 1

## 2018-12-24 NOTE — Patient Care Conference (Signed)
Inpatient RehabilitationTeam Conference and Plan of Care Update Date: 12/22/2018   Time: 2:10 PM    Patient Name: [name] [name]      Medical Record Number: [id number]  Date of Birth: 1955-12-21 Sex: Male         Room/Bed: 4W12C/4W12C-01 Payor Info: Payor: Advertising copywriter / Plan: GEHA / Product Type: *No Product type* /    Admitting Diagnosis: cervical fusion  Admit Date/Time:  12/10/2018  5:52 PM Admission Comments: No comment available   Primary Diagnosis:  <principal problem not specified> Principal Problem: <principal problem not specified>  Patient Active Problem List   Diagnosis Date Noted  . Acute on chronic anemia   . Radiculopathy 12/10/2018  . Cervical myelopathy (HCC) 12/07/2018  . Microcytic hypochromic anemia 08/27/2018  . At risk for adverse drug event 08/10/2018  . Chronic pain syndrome   . Labile blood pressure   . Urinary retention   . Hypokalemia   . Hypertension   . Acute blood loss anemia   . Anemia of chronic disease   . Abscess in epidural space of lumbar spine   . Effusion, left knee   . Pain and swelling of wrist, left   . Complete tear of left rotator cuff 07/03/2018    Class: Chronic  . MSSA bacteremia   . Septic arthritis of knee, left (HCC) 06/30/2018    Class: Acute  . Leukocytosis   . Postoperative seroma involving nervous system after nervous system procedure   . Shoulder pain, bilateral   . Thoracic myelopathy 06/28/2018  . Spondylosis, thoracic, with myelopathy 06/28/2018    Class: Acute  . Abscess in epidural space of L2-L5 lumbar spine 06/28/2018    Class: Acute  . Acute urinary retention 06/28/2018  . Left cervical radiculopathy 06/28/2018    Class: Acute  . Spinal stenosis of lumbar region 11/17/2017    Class: Chronic  . Status post lumbar laminectomy 11/17/2017  . Lumbar stenosis with neurogenic claudication 08/18/2017  . Forestier's disease of thoracolumbar region 08/18/2017  . Degenerative disc disease, lumbar 08/18/2017  .  Lumbar radiculopathy 09/30/2016  . Morbid (severe) obesity due to excess calories (HCC) 09/30/2016  . Biliary calculus with acute cholecystitis 03/21/2016  . Acute pyelonephritis 12/28/2013  . Ureteral stone with hydronephrosis 12/28/2013  . Acute kidney failure (HCC) 12/27/2013  . Severe sepsis with acute organ dysfunction (HCC) 12/27/2013  . Hypotension 12/27/2013  . Lactic acidosis 12/27/2013  . Sepsis secondary to UTI (HCC) 12/27/2013  . EMPYEMA CHEST 08/30/2008  . PRURIGO 08/30/2008  . OBSTRUCTIVE SLEEP APNEA 08/30/2008    Expected Discharge Date: Expected Discharge Date: 12/29/18  Team Members Present: Physician leading conference: Dr. Faith Rogue Social Worker Present: Staci Acosta, LCSW Nurse Present: Vincente Poli, RN PT Present: Other (comment)(Taylor Serita Grit, PT) OT Present: Roney Mans, OT SLP Present: Colin Benton, SLP     Current Status/Progress Goal Weekly Team Focus  Medical   Patient with improvements in motor skills.  Having difficulties with bowel program due to some behavioral issues.  Patient now states that he wants to buy him.  He continues with Foley catheter which has been followed by urology  Improve functional mobility  Pain management, bowel management, spinal cord injury education   Bowel/Bladder   continent of bowel and foley catheter urology to reassess LBM 03/08  remain continent of bowel with mininmal assistance  Assess bowel and bladder needs q shift and PRN and toileting q2h while awake   Swallow/Nutrition/ Hydration  ADL's   Min-Mod A UB dressing, Max A LB dressing/toileting. Total A toilet transfers using Geralyn Corwin  Min A UB self care, Mod A LB self care. Min A functional transfers + Max A toileting   General strengthening, sit<stands, standing balance/endurance, functional transfers, d/c planning    Mobility   min A rolling, mod A slide board transfer, max A to stand in stedy, S w/c mobility  min A overall at w/c  level  transfers, sit to stand tolerance   Communication             Safety/Cognition/ Behavioral Observations            Pain   Neck pain controlled with Norco and tylenol prn  Pain </=3=10  Assess pain q shift and PRN and medicate when necessary    Skin   Steri strips to anterior of neck  Remains free from infection and skin breakdown  Assess skin q shift and PRN    Rehab Goals Patient on target to meet rehab goals: Yes Rehab Goals Revised: none *See Care Plan and progress notes for long and short-term goals.     Barriers to Discharge  Current Status/Progress Possible Resolutions Date Resolved   Physician    Neurogenic Bowel & Bladder;Medical stability        See medical problem list.      Nursing                  PT  Other (comments)  shoulder pain              OT                  SLP                SW                Discharge Planning/Teaching Needs:  Pt plans to return home with wife to provide assistance.  Pt also had HH therapies coming in to work with him and he would like to resume these.  Family education closer to d/c, as needed.   Team Discussion:  Pt is now willing to do bowel program now after talking with Dr. Riley Kill.  It will take a while to get back on track.  Pt is getting stronger, but his shoulder inhibits some of his tasks.  Pt is standing in bariatric stedy and using it for toileting, as slide board is hard for him right now.  He is min/mod A for UB tasks and max A for LB.  Pt is having bowel incontinence.  Pt's goal was for stand pivot tx, but will not reach that prior to d/c.    Revisions to Treatment Plan:  none    Continued Need for Acute Rehabilitation Level of Care: The patient requires daily medical management by a physician with specialized training in physical medicine and rehabilitation for the following conditions: Daily direction of a multidisciplinary physical rehabilitation program to ensure safe treatment while eliciting the highest  outcome that is of practical value to the patient.: Yes Daily medical management of patient stability for increased activity during participation in an intensive rehabilitation regime.: Yes Daily analysis of laboratory values and/or radiology reports with any subsequent need for medication adjustment of medical intervention for : Post surgical problems;Neurological problems;Urological problems   I attest that I was present, lead the team conference, and concur with the assessment and plan of the team.   Shail Urbas, [place]  12/24/2018, 11:09 AM

## 2018-12-24 NOTE — Progress Notes (Signed)
Occupational Therapy Session Note  Patient Details  Name: [name] [name] MRN: [id number] Date of Birth: 12-03-1955  Today's Date: 12/24/2018 OT Individual Time: 1100-1155 OT Individual Time Calculation (min): 55 min    Short Term Goals: Week 1:  OT Short Term Goal 1 (Week 1): Pt will complete 1/3 components of donning pants using AE as needed  OT Short Term Goal 1 - Progress (Week 1): Met OT Short Term Goal 2 (Week 1): Pt will complete bathing sit<stand with 1 assist  OT Short Term Goal 2 - Progress (Week 1): Met OT Short Term Goal 3 (Week 1): Pt will complete 1 grooming task while semi perched in [place] to work on standing tolerance  OT Short Term Goal 3 - Progress (Week 1): Met  Skilled Therapeutic Interventions/Progress Updates:    1;1.    Drect  Handoff from previous OT. Pt completes LB and UB therex as follows for general strengthening required for BADLs and functional transfers   2x10 tricep extension and internal/ext rotation with orange level 2 theraband.  3x10 supine hip flex/ext, ab/adduct, and hip int/ext rotation with LE on maxi slide 3x15 glute bridge and frog pumps for glute strengthening and activation with minimal buttocks clearance off of bed.   Pt reporting thinking he had a BM, however after clothing management/roling B with MIN A, it was just gas.   Exited session with pt seated in bed, call lgiht in reach and all needs met  Therapy Documentation Precautions:  Precautions Precautions: Cervical, Fall Precaution Comments: reviewed cervical precautions with patient, min cueing to recall   Required Braces or Orthoses: Cervical Brace Cervical Brace: Soft collar Restrictions Weight Bearing Restrictions: No General:   Vital Signs:  Pain: Pain Assessment Pain Score: 0-No pain s:     Therapy/Group: Individual Therapy  Tonny Branch 12/24/2018, 11:20 AM

## 2018-12-24 NOTE — Progress Notes (Signed)
Physical Therapy Session Note  Patient Details  Name: [name] [name] MRN: [id number] Date of Birth: [birth year]  Today's Date: 12/24/2018 PT Individual Time: 0800-0900 PT Individual Time Calculation (min): 60 min   Short Term Goals: Week 2:  PT Short Term Goal 1 (Week 2): Pt will complete slide board transfer with mod A consistently PT Short Term Goal 2 (Week 2): Pt will stand with mod A consistently with LRAD PT Short Term Goal 3 (Week 2): Pt will propel manual w/c x 150 ft with use of BUE and Supervision  Skilled Therapeutic Interventions/Progress Updates:    Pt received seated in bed, agreeable to PT session. Pt reports L shoulder pain, just received medication from RN prior to start of therapy session. Per pt he has started his bowel program this AM but no results. Pt willing to get up to bedside commode to attempt to have a bowel movement. Supine to sit with mod A for trunk control. Sit to stand with max A to stedy from elevated bed height. Stedy transfer bed to bedside commode. Pt is able to have a bowel smear, no further results. Pt is dependent to pericare and donning new brief and pants. Stedy transfer commode to w/c. Manual w/c propulsion x 150 ft with use of BUE and BLE, pt fatigues quickly. Slide board transfer w/c to level mat table with mod A and v/c for head/hips relationship and weight shift during transfer going to the L. Pt tends to lean in the direction he is scooting towards despite ongoing education. Slide board transfer mat table to w/c with min A going downhill to the R. Attempt slide board transfer w/c to mat table going to the R, pt too fatigued to complete transfer safely so transfer aborted. Pt requires max A and increased time and cueing to scoot hips back in w/c due to fatigue. Pt left seated in w/c in room with needs in reach at end of session.  Therapy Documentation Precautions:  Precautions Precautions: Cervical, Fall Precaution Comments: reviewed cervical precautions  with patient, min cueing to recall   Required Braces or Orthoses: Cervical Brace Cervical Brace: Soft collar Restrictions Weight Bearing Restrictions: No   Therapy/Group: Individual Therapy   Peter Congo, PT, DPT  12/24/2018, 10:09 AM

## 2018-12-24 NOTE — Progress Notes (Signed)
Occupational Therapy Session Note  Patient Details  Name: [name] [name] MRN: [id number] Date of Birth: 1955-11-26  Today's Date: 12/24/2018 OT Missed Time: 60 Minutes Missed Time Reason: Patient fatigue;Patient unwilling/refused to participate without medical reason   Short Term Goals: Week 2:  OT Short Term Goal 1 (Week 2): STGs=LTGs due to ELOS  Skilled Therapeutic Interventions/Progress Updates:    THerapist arrived at 1300 for scheduled OT session. Pt in supine with wife present. Pt with complaints "of just feeling bad" reports feeling like he has cold and generalized fatigue. Attempted education regarding what to do on "off days" with pt and wife. Pt reports he would just stay in bed and not willing to expand or open to education. Pt requesting to remain rest in bed and declining session.  Therapist returning 1.5 hours later. Pt cont with complaints of not feeling well and cont to refuse therapy. Will cont POC as pt able.   Therapy Documentation Precautions:  Precautions Precautions: Cervical, Fall Precaution Comments: reviewed cervical precautions with patient, min cueing to recall   Required Braces or Orthoses: Cervical Brace Cervical Brace: Soft collar Restrictions Weight Bearing Restrictions: No   Therapy/Group: Individual Therapy  Zeda Gangwer L 12/24/2018, 7:02 AM

## 2018-12-24 NOTE — Plan of Care (Signed)
  Problem: RH BOWEL ELIMINATION Goal: RH STG MANAGE BOWEL WITH ASSISTANCE Description STG Manage Bowel with mod Assistance.   Outcome: Progressing; per pt he is on bowel program

## 2018-12-24 NOTE — Progress Notes (Signed)
Occupational Therapy Session Note  Patient Details  Name: [name] [name] MRN: [id number] Date of Birth: [birth year]  Today's Date: 12/24/2018 OT Individual Time: 1035-1100 OT Individual Time Calculation (min): 25 min    Short Term Goals: Week 1:  OT Short Term Goal 1 (Week 1): Pt will complete 1/3 components of donning pants using AE as needed  OT Short Term Goal 1 - Progress (Week 1): Met OT Short Term Goal 2 (Week 1): Pt will complete bathing sit<stand with 1 assist  OT Short Term Goal 2 - Progress (Week 1): Met OT Short Term Goal 3 (Week 1): Pt will complete 1 grooming task while semi perched in Stedy to work on standing tolerance  OT Short Term Goal 3 - Progress (Week 1): Met Week 2:  OT Short Term Goal 1 (Week 2): STGs=LTGs due to ELOS     Skilled Therapeutic Interventions/Progress Updates:    Pt received sitting in wc looking visibly uncomfortable.  He stated that he has started hurting quite a bit from sitting and just could not get comfortable. Recommended we change positions and have pt rest supine.  Reviewed slide board transfers and head/hip ratio.  Pt was able to transfer wc to bed to his L side with min A.  Pt very surprised as he said it was very difficulty for him to do earlier today.  Once at edge of bed, practiced reciprocally moving hips back (max ) and then with both hips using a forward lean. Pt able to do this well and scoot back with min A. Worked on bed mobility of laying directly to his L side , having legs brought up to bed for him and then pt rolling back.   Worked on tricep strength needed for transfers using a Level 2 theraband.  Pt's next therapist arrived for his next session.  Therapy Documentation Precautions:  Precautions Precautions: Cervical, Fall Precaution Comments: reviewed cervical precautions with patient, min cueing to recall   Required Braces or Orthoses: Cervical Brace Cervical Brace: Soft collar Restrictions Weight Bearing Restrictions:  No  Pain: Pain Assessment Pain Score: 6  Pain Type: Surgical pain Pain Location: Neck Pain Descriptors / Indicators: Aching Pain Onset: On-going Pain Intervention(s): Rest;Repositioned    Therapy/Group: Individual Therapy  [place] 12/24/2018, 11:59 AM

## 2018-12-24 NOTE — Progress Notes (Signed)
Social Work Patient ID: [name] [name], male   DOB: 1956-08-25, 63 y.o.   MRN: [id number]   CSW met with pt and his wife to update them on team conference discussion and that targeted d/c date remains 12-29-18.  Pt and wife were pleased with this and wife is using this time he is here as some needed respite and she will be ready for him when he comes home.  Pt has all needed DME and wishes to resume HH with [facility].  CSW will continue to follow and assist as needed.

## 2018-12-24 NOTE — Progress Notes (Signed)
[facility] PHYSICAL MEDICINE & REHABILITATION PROGRESS NOTE   Subjective/Complaints: Pt making gains with mobility. Urine with odor. Denies fever or chills.   ROS: Patient denies fever, rash, sore throat, blurred vision, nausea, vomiting, diarrhea, cough, shortness of breath or chest pain, joint or back pain, headache, or mood change.    Objective:   No results found. No results for input(s): WBC, HGB, HCT, PLT in the last 72 hours. No results for input(s): NA, K, CL, CO2, GLUCOSE, BUN, CREATININE, CALCIUM in the last 72 hours.  Intake/Output Summary (Last 24 hours) at 12/24/2018 1110 Last data filed at 12/24/2018 0730 Gross per 24 hour  Intake 358 ml  Output 600 ml  Net -242 ml     Physical Exam: Vital Signs Blood pressure (!) 150/80, pulse 80, temperature 98.2 F (36.8 C), temperature source Oral, resp. rate 18, height 5\' 10"  (1.778 m), weight (!) 139 kg, SpO2 100 %.  Constitutional: No distress . Vital signs reviewed. obese HEENT: EOMI, oral membranes moist Neck: supple Cardiovascular: RRR without murmur. No JVD    Respiratory: CTA Bilaterally without wheezes or rales. Normal effort    GI: BS +, non-tender, non-distended  Uro: urine clear,green in color, malodorous Musc: No edema or tenderness in extremities. Skin: Warm and dry.  Intact. Neurologic: Alert and oriented Motor: 5/5 in bilateral deltoid, bicep, tricep, grip  Right lower extremity: 3/5 proximal distal, stable examination left lower extremity: 3--3/5 proximal to distal, stable examination Psych: pleasant  Assessment/Plan: 1. Functional deficits secondary to cervical myelopathy which require 3+ hours per day of interdisciplinary therapy in a comprehensive inpatient rehab setting.  Physiatrist is providing close team supervision and 24 hour management of active medical problems listed below.  Physiatrist and rehab team continue to assess barriers to discharge/monitor patient progress toward functional and  medical goals  Care Tool:  Bathing  Bathing activity did not occur: Refused Body parts bathed by patient: Chest, Abdomen, Left upper leg, Right upper leg, Face, Right arm, Left arm, Right lower leg, Left lower leg   Body parts bathed by helper: Front perineal area, Buttocks     Bathing assist Assist Level: Moderate Assistance - Patient 50 - 74%     Upper Body Dressing/Undressing Upper body dressing   What is the patient wearing?: Pull over shirt    Upper body assist Assist Level: Minimal Assistance - Patient > 75%    Lower Body Dressing/Undressing Lower body dressing      What is the patient wearing?: Pants     Lower body assist Assist for lower body dressing: Maximal Assistance - Patient 25 - 49%     Toileting Toileting    Toileting assist Assist for toileting: Total Assistance - Patient < 25%     Transfers Chair/bed transfer  Transfers assist  Chair/bed transfer activity did not occur: Safety/medical concerns  Chair/bed transfer assist level: Moderate Assistance - Patient 50 - 74%(slide board)     Locomotion Ambulation   Ambulation assist   Ambulation activity did not occur: Safety/medical concerns          Walk 10 feet activity   Assist  Walk 10 feet activity did not occur: Safety/medical concerns        Walk 50 feet activity   Assist Walk 50 feet with 2 turns activity did not occur: Safety/medical concerns         Walk 150 feet activity   Assist Walk 150 feet activity did not occur: Safety/medical concerns  Walk 10 feet on uneven surface  activity   Assist Walk 10 feet on uneven surfaces activity did not occur: Safety/medical concerns         Wheelchair     Assist Will patient use wheelchair at discharge?: Yes Type of Wheelchair: Manual Wheelchair activity did not occur: Safety/medical concerns  Wheelchair assist level: Supervision/Verbal cueing Max wheelchair distance: 150'    Wheelchair 50 feet  with 2 turns activity    Assist    Wheelchair 50 feet with 2 turns activity did not occur: Safety/medical concerns   Assist Level: Supervision/Verbal cueing   Wheelchair 150 feet activity     Assist Wheelchair 150 feet activity did not occur: Safety/medical concerns   Assist Level: Supervision/Verbal cueing    Medical Problem List and Plan: 1. Tetraparesis secondary to cervical stenosis myelopathy (c3-6)  s/p C3-6 ACDF  -hx of prior lumbar decompression    -Continue CIR therapies including PT, OT  2. Antithrombotics: -DVT/anticoagulation:Mechanical:Antiembolism stockings, knee (TED hose) Bilateral lower extremities Sequential compression devices, below kneeBilateral lower extremities -antiplatelet therapy: N/A 3. Pain Management:On Cymbalta daily. Continue hydrocodone prn  -improved left pain after injection 3/4 4. Mood:LCSW to follow for evaluation and support. -antipsychotic agents: N/A 5. Neuropsych: This patientiscapable of making decisions on hisown behalf. 6. Skin/Wound Care:Routine pressure relief measures. 7. Fluids/Electrolytes/Nutrition:Monitor I/O.   Potassium 3.9 today 3.9  -pt is eating more bananas and fruit  -Continue kdur daily---recheck BMET friday 8. Urinary retention/BPH: Question plans for TURP. Continue Flomax.  - consider voiding trial soon---urology to see during admit at some point.    -given malodorous urine, will change out foley, flush bladder x 2.   -no fever, no sediment, curious green color however 9. Anemia of chronic disease: On iron for supplement.  Hemoglobin 9.5 on 3/3--- up to 10.6 3/9 10. Leukopenia: resolved.  11. Morbid obesity: Encourage appropriate diet and weight loss to help promote mobility and health. 12. Neurogenic bowel: working on re-establishing bowel program  -aiming for AM program  -continue with current plan LOS: 14 days A FACE TO FACE EVALUATION WAS  PERFORMED  Ranelle Oyster 12/24/2018, 11:10 AM

## 2018-12-24 NOTE — Progress Notes (Signed)
[place] [id number]

## 2018-12-25 ENCOUNTER — Inpatient Hospital Stay (HOSPITAL_COMMUNITY): Payer: No Typology Code available for payment source | Admitting: Physical Therapy

## 2018-12-25 ENCOUNTER — Inpatient Hospital Stay (HOSPITAL_COMMUNITY): Payer: No Typology Code available for payment source

## 2018-12-25 ENCOUNTER — Inpatient Hospital Stay (HOSPITAL_COMMUNITY): Payer: No Typology Code available for payment source | Admitting: Occupational Therapy

## 2018-12-25 LAB — CBC
HEMATOCRIT: 30.7 % — AB (ref 39.0–52.0)
HEMATOCRIT: 30.4 % — AB (ref 39.0–52.0)
HEMOGLOBIN: 9.3 g/dL — AB (ref 13.0–17.0)
Hemoglobin: 9.5 g/dL — ABNORMAL LOW (ref 13.0–17.0)
MCH: 24.2 pg — ABNORMAL LOW (ref 26.0–34.0)
MCH: 23.7 pg — ABNORMAL LOW (ref 26.0–34.0)
MCHC: 30.9 g/dL (ref 30.0–36.0)
MCHC: 30.6 g/dL (ref 30.0–36.0)
MCV: 78.3 fL — ABNORMAL LOW (ref 80.0–100.0)
MCV: 77.4 fL — ABNORMAL LOW (ref 80.0–100.0)
Platelets: 248 10*3/uL (ref 150–400)
Platelets: 234 10*3/uL (ref 150–400)
RBC: 3.92 MIL/uL — ABNORMAL LOW (ref 4.22–5.81)
RBC: 3.93 MIL/uL — ABNORMAL LOW (ref 4.22–5.81)
RDW: 16.1 % — AB (ref 11.5–15.5)
RDW: 16.1 % — AB (ref 11.5–15.5)
WBC: 4 10*3/uL (ref 4.0–10.5)
WBC: 4.1 10*3/uL (ref 4.0–10.5)
nRBC: 0 % (ref 0.0–0.2)
nRBC: 0 % (ref 0.0–0.2)

## 2018-12-25 LAB — BASIC METABOLIC PANEL
Anion gap: 5 (ref 5–15)
Anion gap: 4 — ABNORMAL LOW (ref 5–15)
BUN: 12 mg/dL (ref 8–23)
BUN: 14 mg/dL (ref 8–23)
CO2: 27 mmol/L (ref 22–32)
CO2: 23 mmol/L (ref 22–32)
CREATININE: 0.82 mg/dL (ref 0.61–1.24)
Calcium: 8.7 mg/dL — ABNORMAL LOW (ref 8.9–10.3)
Calcium: 8.3 mg/dL — ABNORMAL LOW (ref 8.9–10.3)
Chloride: 106 mmol/L (ref 98–111)
Chloride: 110 mmol/L (ref 98–111)
Creatinine, Ser: 0.88 mg/dL (ref 0.61–1.24)
GFR calc Af Amer: >60 mL/min (ref >60)
GFR calc Af Amer: >60 mL/min (ref >60)
GFR calc non Af Amer: >60 mL/min (ref >60)
GFR calc non Af Amer: >60 mL/min (ref >60)
Glucose, Bld: 97 mg/dL (ref 70–99)
Glucose, Bld: 110 mg/dL — ABNORMAL HIGH (ref 70–99)
Potassium: 3.5 mmol/L (ref 3.5–5.1)
Potassium: 3.4 mmol/L — ABNORMAL LOW (ref 3.5–5.1)
Sodium: 138 mmol/L (ref 135–145)
Sodium: 137 mmol/L (ref 135–145)

## 2018-12-25 LAB — URINALYSIS, COMPLETE (UACMP) WITH MICROSCOPIC
Bilirubin Urine: NEGATIVE
Glucose, UA: NEGATIVE mg/dL
Ketones, ur: NEGATIVE mg/dL
Nitrite: POSITIVE — AB
Protein, ur: 30 mg/dL — AB
RBC / HPF: >50 RBC/hpf — ABNORMAL HIGH (ref 0–5)
Specific Gravity, Urine: 1.023 (ref 1.005–1.030)
WBC, UA: >50 WBC/hpf — ABNORMAL HIGH (ref 0–5)
pH: 5 (ref 5.0–8.0)

## 2018-12-25 MED ORDER — SODIUM CHLORIDE 0.9 % IV SOLN
1.0000 g | INTRAVENOUS | Status: DC
  Administered 2018-12-25 – 2018-12-28 (×4): 1 g via INTRAVENOUS
  Filled 2018-12-25 (×4): qty 10

## 2018-12-25 MED ORDER — SODIUM CHLORIDE 0.45 % IV SOLN
INTRAVENOUS | Status: DC
  Administered 2018-12-25: 22:00:00 via INTRAVENOUS

## 2018-12-25 MED ORDER — POTASSIUM CHLORIDE CRYS ER 20 MEQ PO TBCR
40.0000 meq | EXTENDED_RELEASE_TABLET | Freq: Every day | ORAL | Status: DC
  Administered 2018-12-26 – 2018-12-28 (×2): 20 meq via ORAL
  Filled 2018-12-25 (×4): qty 2

## 2018-12-25 NOTE — Progress Notes (Signed)
[facility] PHYSICAL MEDICINE & REHABILITATION PROGRESS NOTE   Subjective/Complaints: Pt without complaints. Foley changed out without issues. Still some cough, drops/robitussin helpful. Moved bowels at 0500 this morning before bowel program  ROS: Patient denies fever, rash, sore throat, blurred vision, nausea, vomiting, diarrhea, cough, shortness of breath or chest pain, joint or back pain, headache, or mood change.   Objective:   No results found. Recent Labs    12/25/18 0559  WBC 4.0  HGB 9.5*  HCT 30.7*  PLT 248   Recent Labs    12/25/18 0559  NA 138  K 3.5  CL 106  CO2 27  GLUCOSE 97  BUN 12  CREATININE 0.82  CALCIUM 8.7*    Intake/Output Summary (Last 24 hours) at 12/25/2018 7902 Last data filed at 12/25/2018 0818 Gross per 24 hour  Intake 418 ml  Output 2050 ml  Net -1632 ml     Physical Exam: Vital Signs Blood pressure 133/88, pulse 89, temperature 99.1 F (37.3 C), temperature source Oral, resp. rate 16, height 5\' 10"  (1.778 m), weight (!) 139 kg, SpO2 97 %.  Constitutional: No distress . Vital signs reviewed. HEENT: EOMI, oral membranes moist Neck: supple Cardiovascular: RRR without murmur. No JVD    Respiratory: CTA Bilaterally without wheezes or rales. Normal effort    GI: BS +, non-tender, non-distended  Uro: yellow, clear Musc: No edema or tenderness in extremities. Skin: Warm and dry.  Intact. Neurologic: Alert and oriented Motor: 5/5 in bilateral deltoid, bicep, tricep, grip  Right lower extremity: 3/5 proximal distal, stable examination left lower extremity: 3- to 3/5 proximal to distal, stable examination Psych: pleasant  Assessment/Plan: 1. Functional deficits secondary to cervical myelopathy which require 3+ hours per day of interdisciplinary therapy in a comprehensive inpatient rehab setting.  Physiatrist is providing close team supervision and 24 hour management of active medical problems listed below.  Physiatrist and rehab team  continue to assess barriers to discharge/monitor patient progress toward functional and medical goals  Care Tool:  Bathing  Bathing activity did not occur: Refused Body parts bathed by patient: Chest, Abdomen, Left upper leg, Right upper leg, Face, Right arm, Left arm, Right lower leg, Left lower leg   Body parts bathed by helper: Front perineal area, Buttocks     Bathing assist Assist Level: Moderate Assistance - Patient 50 - 74%     Upper Body Dressing/Undressing Upper body dressing   What is the patient wearing?: Pull over shirt    Upper body assist Assist Level: Minimal Assistance - Patient > 75%    Lower Body Dressing/Undressing Lower body dressing      What is the patient wearing?: Pants     Lower body assist Assist for lower body dressing: Maximal Assistance - Patient 25 - 49%     Toileting Toileting    Toileting assist Assist for toileting: Total Assistance - Patient < 25%     Transfers Chair/bed transfer  Transfers assist  Chair/bed transfer activity did not occur: Safety/medical concerns  Chair/bed transfer assist level: Minimal Assistance - Patient > 75%(slide board)     Locomotion Ambulation   Ambulation assist   Ambulation activity did not occur: Safety/medical concerns          Walk 10 feet activity   Assist  Walk 10 feet activity did not occur: Safety/medical concerns        Walk 50 feet activity   Assist Walk 50 feet with 2 turns activity did not occur: Safety/medical concerns  Walk 150 feet activity   Assist Walk 150 feet activity did not occur: Safety/medical concerns         Walk 10 feet on uneven surface  activity   Assist Walk 10 feet on uneven surfaces activity did not occur: Safety/medical concerns         Wheelchair     Assist Will patient use wheelchair at discharge?: Yes Type of Wheelchair: Manual Wheelchair activity did not occur: Safety/medical concerns  Wheelchair assist level:  Supervision/Verbal cueing Max wheelchair distance: 150'    Wheelchair 50 feet with 2 turns activity    Assist    Wheelchair 50 feet with 2 turns activity did not occur: Safety/medical concerns   Assist Level: Supervision/Verbal cueing   Wheelchair 150 feet activity     Assist Wheelchair 150 feet activity did not occur: Safety/medical concerns   Assist Level: Supervision/Verbal cueing    Medical Problem List and Plan: 1. Tetraparesis secondary to cervical stenosis myelopathy (c3-6)  s/p C3-6 ACDF  -hx of prior lumbar decompression    -Continue CIR therapies including PT, OT  2. Antithrombotics: -DVT/anticoagulation:Mechanical:Antiembolism stockings, knee (TED hose) Bilateral lower extremities Sequential compression devices, below kneeBilateral lower extremities -antiplatelet therapy: N/A 3. Pain Management:On Cymbalta daily. Continue hydrocodone prn  -improved left pain after injection 3/4 4. Mood:LCSW to follow for evaluation and support. -antipsychotic agents: N/A 5. Neuropsych: This patientiscapable of making decisions on hisown behalf. 6. Skin/Wound Care:Routine pressure relief measures. 7. Fluids/Electrolytes/Nutrition:Monitor I/O.   Potassium 3.5 3/13  -pt is eating more bananas and fruit  -increase kdur to daily, recheck Monday  -I personally reviewed the patient's labs today.   8. Urinary retention/BPH: Question plans for TURP. Continue Flomax.  - consider voiding trial soon---urology to see during admit at some point.    -foley changed/bladder flushed  -wbc's 4. 0 3/13  -continue to boserve 9. Anemia of chronic disease: On iron for supplement.  Hemoglobin 9.5 on 3/3--- up to 10.6 3/9---> back to 9.5 3/13 10. Leukopenia: 4.0 11. Morbid obesity: Encourage appropriate diet and weight loss to help promote mobility and health. 12. Neurogenic bowel: working on re-establishing bowel program  -AM program LOS: 15  days A FACE TO FACE EVALUATION WAS PERFORMED  Ranelle Oyster 12/25/2018, 9:27 AM

## 2018-12-25 NOTE — Progress Notes (Signed)
Physical Therapy Weekly Progress Note  Patient Details  Name: [name] [name] MRN: [id number] Date of Birth: 05-18-1956  Beginning of progress report period: December 18, 2018 End of progress report period: December 25, 2018  Today's Date: 12/25/2018 PT Individual Time: 1000-1100 PT Individual Time Calculation (min): 60 min   Patient has met 2 of 3 short term goals.  Pt is able to complete slide board transfers with min to mod A depending on fatigue level. Pt is able to perform w/c mobility up to 150 ft with use of BUE and Supervision. Pt still requires max A consistently in order to stand to a stedy, is unable to safely stand to a RW at this time.  Patient continues to demonstrate the following deficits muscle weakness, abnormal tone and unbalanced muscle activation and decreased standing balance, decreased postural control and decreased balance strategies and therefore will continue to benefit from skilled PT intervention to increase functional independence with mobility.  Patient not progressing toward long term goals.  See goal revision..  Plan of care revisions: patient's sit to stand goal was downgraded from min A to max A due to slow progress towards reaching this goal. Pt was not able to perform sit to stand prior to this admission. Also d/c car transfer goal as patient was not performing car transfers prior to admission and utilized a transport company for transportation.  PT Short Term Goals Week 2:  PT Short Term Goal 1 (Week 2): Pt will complete slide board transfer with mod A consistently (Met) PT Short Term Goal 2 (Week 2): Pt will stand with mod A consistently with LRAD (Not Met) PT Short Term Goal 3 (Week 2): Pt will propel manual w/c x 150 ft with use of BUE and Supervision (Met) Week 3:  PT Short Term Goal 1 (Week 3): =LTG due to ELOS  Skilled Therapeutic Interventions/Progress Updates:    Therapy Documentation Precautions:  Precautions Precautions: Cervical, Fall Precaution  Comments: reviewed cervical precautions with patient, min cueing to recall   Required Braces or Orthoses: Cervical Brace Cervical Brace: Soft collar Restrictions Weight Bearing Restrictions: No   Therapy/Group: Individual Therapy   Excell Seltzer, PT, DPT 12/25/2018, 3:39 PM

## 2018-12-25 NOTE — Progress Notes (Signed)
Occupational Therapy Session Note  Patient Details  Name: [name] [name] MRN: [id number] Date of Birth: 1956/06/29  Today's Date: 12/25/2018 OT Individual Time: 0340-3524 OT Individual Time Calculation (min): 60 min    Short Term Goals: Week 1:  OT Short Term Goal 1 (Week 1): Pt will complete 1/3 components of donning pants using AE as needed  OT Short Term Goal 1 - Progress (Week 1): Met OT Short Term Goal 2 (Week 1): Pt will complete bathing sit<stand with 1 assist  OT Short Term Goal 2 - Progress (Week 1): Met OT Short Term Goal 3 (Week 1): Pt will complete 1 grooming task while semi perched in Stedy to work on standing tolerance  OT Short Term Goal 3 - Progress (Week 1): Met Week 2:  OT Short Term Goal 1 (Week 2): STGs=LTGs due to ELOS     Skilled Therapeutic Interventions/Progress Updates:    Pt received in bed stating he was not having pain but was sweating due to a low grade fever.  Pt sat to EOB with mod A.  Did not try a shower today due to pt not feeling as well.  From EOB did not use long sponge to prevent water spillage.  Pt needed help to fully get under R arm and to wash lower legs.    Pt then had bowel incontinence of liquidy bowel that he could not feel.  It was too difficult to cleanse pt with lateral leans.  Pt used stedy to come to stand with max A. He was unable to come into a full stand and it was too difficult to feed brief under his hips.  Called in NT to A.  It was still too difficult as his scrotum was tucked underneath him and it was unable to be pulled forward without him coming to a full stand.  Also pt began to feel dizzy and sweating profusely. Moved pt back to bed so he could roll side to side for cleansing of front perineal area and for better adjustment of brief.  Donned pants in supine with max A.  Adjusted pt in bed.  Pt donned shirt and was more relaxed and feeling better.  Pt resting in bed with all needs met. Bed alarm set.   Therapy  Documentation Precautions:  Precautions Precautions: Cervical, Fall Precaution Comments: reviewed cervical precautions with patient, min cueing to recall   Required Braces or Orthoses: Cervical Brace Cervical Brace: Soft collar Restrictions Weight Bearing Restrictions: No   Pain: Pain Assessment Pain Scale: 0-10 Pain Score: 0-No pain    Therapy/Group: Individual Therapy  [place] 12/25/2018, 10:40 AM

## 2018-12-25 NOTE — Progress Notes (Signed)
Occupational Therapy Session Note  Patient Details  Name: [name] [name] MRN: [id number] Date of Birth: 1955/12/22  Today's Date: 12/25/2018 OT Individual Time: 6389-3734 OT Individual Time Calculation (min): 58 min  17 minutes missed due to fatigue   Short Term Goals: Week 2:  OT Short Term Goal 1 (Week 2): STGs=LTGs due to ELOS  Skilled Therapeutic Interventions/Progress Updates:    Pt greeted in w/c with spouse Meriam Sprague present. They both verified that PTA pt was not using the toilet at all. He was using briefs due to incontinence, and spouse was providing Total A for hygiene bedlevel. Per spouse, she will only consider OOB toileting when pt is able to stand and pivot to toilet vs use slideboard for safety reasons. Educated pt and spouse to complete UB self care EOB and to use AE for maintaining functional independence when donning pants and shoes at home. Strongly encouraged pt to participate in daily household activities for endurance, strengthening, and OOB tolerance. Also advised return to meaningful leisure activities, such as music playing (he played multiple instruments, including guitar) to address psychosocial health, and provide therapeutic distraction from chronic pain. Attempted to complete slideboard transfer for family education. Pushing towards Rt side using Lt UE was too painful, and therefore pt setup w/c so that he was pushing towards Lt using R UE. Due to slightly elevated bed, pt was going uphill. Pt reported he was having pain in "my shoulder, my knee, my neck" and stated "I just can't do this right now." Transfer practice terminated due to pain and safety. Pt refused for OT to inquire RN if he could have pain medication. Also refused use of hot/cold modalities. Amenable to gentle ROM. OT guided him through gentle shoulder exercises to provide pain relief, including forward/backward shoulder rolls, modified pendulums (with active assist from OT), shoulder shrugs, and scapular  pinches, 10-15 reps, 2 sets each exercise. He also reported 5-6 HA rating. Issued him lavender to use via inhalation with pt consent. He reported a decrease in HA rating to 2 after a few minutes of wafting essential oil. Often during session pt exhibited posterior bias in w/c. He required vcs for recriprical scooting of hips back in w/c. When fatigued, he required Mod A and manual facilitation for Lt>Rt leans. Pt reported feeling too fatigued to continue with tx at this point. He was left with all needs within reach and spouse present. 17 minutes missed.   Therapy Documentation Precautions:  Precautions Precautions: Cervical, Fall Precaution Comments: reviewed cervical precautions with patient, min cueing to recall   Required Braces or Orthoses: Cervical Brace Cervical Brace: Soft collar Restrictions Weight Bearing Restrictions: No Vital Signs: Therapy Vitals Temp: 99.7 F (37.6 C) Temp Source: Oral Pulse Rate: 85 Resp: 15 BP: 124/74 Patient Position (if appropriate): Sitting Oxygen Therapy SpO2: 95 % O2 Device: Room Air Pain: In Lt shoulder, Lt knee, and neck. Unrated.  Pain Assessment Pain Score: 0-No pain ADL: ADL Grooming: Supervision/safety Where Assessed-Grooming: Other (comment)(standing in [place]) Upper Body Bathing: Supervision/safety Where Assessed-Upper Body Bathing: Other (Comment)(semi perched in [place]) Lower Body Bathing: Moderate assistance Where Assessed-Lower Body Bathing: Standing at sink, Sitting at sink(using Bariatric [place]) Upper Body Dressing: Moderate assistance Where Assessed-Upper Body Dressing: Sitting at sink Lower Body Dressing: Moderate assistance Where Assessed-Lower Body Dressing: Wheelchair, Standing at sink, Sitting at sink(using Geralyn Corwin) Toileting: Other (Comment)(Sit<stand in Hall- 2 assist) Toilet Transfer: Other (comment)(2 assist) Toilet Transfer Method: Stand pivot Toilet Transfer Equipment: Bedside commode Tub/Shower Transfer:  Not assessed  Therapy/Group: Individual Therapy  Zuriel Yeaman A Maze Corniel 12/25/2018, 3:54 PM

## 2018-12-26 DIAGNOSIS — A499 Bacterial infection, unspecified: Secondary | ICD-10-CM

## 2018-12-26 DIAGNOSIS — N390 Urinary tract infection, site not specified: Secondary | ICD-10-CM

## 2018-12-26 MED ORDER — SODIUM CHLORIDE 0.45 % IV SOLN
INTRAVENOUS | Status: DC
  Administered 2018-12-26 – 2018-12-27 (×3): via INTRAVENOUS

## 2018-12-26 NOTE — Progress Notes (Addendum)
Patient c/o headache meds given; T101-102  After tylenol T99-100.4;  Offered and encouraged to drinik fluids.     Continued to monitor.

## 2018-12-26 NOTE — Progress Notes (Addendum)
t-102.9 ; per pt he  has no  appetite; MD notified.

## 2018-12-26 NOTE — Progress Notes (Signed)
Patient T100.3 after tylenol;  noted small amount  whitish discharge on his penis. Noted  whitish sediments on his urine. Patient has UTI. Per patient he was feeling better after the tylenol  Offered and encouraged to drink lots of fluids. Continued to monitor.

## 2018-12-26 NOTE — Progress Notes (Signed)
[place] PHYSICAL MEDICINE & REHABILITATION PROGRESS NOTE   Subjective/Complaints: Pt developed fever 102-103 last night. Pt denied cough this morning and stated he felt a little better than last night. No appetite. RN reports some cough.   ROS: Patient denies fever, rash, sore throat, blurred vision, nausea, vomiting, diarrhea,  shortness of breath or chest pain,  headache, or mood change.   Objective:   Dg Chest Port 1 View  Result Date: 12/25/2018 CLINICAL DATA:  Fever EXAM: PORTABLE CHEST 1 VIEW COMPARISON:  June 28, 2018 FINDINGS: The lungs are clear. Heart is slightly enlarged with pulmonary vascularity normal. No adenopathy. There is aortic atherosclerosis. There is postoperative change in the lower cervical region. IMPRESSION: Mild cardiac enlargement. No edema or consolidation. Aortic Atherosclerosis (ICD10-I70.0). Electronically Signed   By: Bretta Bang III M.D.   On: 12/25/2018 21:35   Recent Labs    12/25/18 0559 12/25/18 2135  WBC 4.0 4.1  HGB 9.5* 9.3*  HCT 30.7* 30.4*  PLT 248 234   Recent Labs    12/25/18 0559 12/25/18 2135  NA 138 137  K 3.5 3.4*  CL 106 110  CO2 27 23  GLUCOSE 97 110*  BUN 12 14  CREATININE 0.82 0.88  CALCIUM 8.7* 8.3*    Intake/Output Summary (Last 24 hours) at 12/26/2018 0919 Last data filed at 12/26/2018 0530 Gross per 24 hour  Intake 1344.8 ml  Output 1825 ml  Net -480.2 ml     Physical Exam: Vital Signs Blood pressure (!) 164/99, pulse 95, temperature 99.7 F (37.6 C), temperature source Oral, resp. rate 20, height 5\' 10"  (1.778 m), weight (!) 139 kg, SpO2 94 %.  Constitutional: No distress . Vital signs reviewed. HEENT: EOMI, oral membranes moist Neck: supple Cardiovascular: RRR without murmur. No JVD    Respiratory: CTA Bilaterally without wheezes or rales. Normal effort    GI: BS +, non-tender, non-distended  Uro: yellow, no obvious sediment Musc: No edema or tenderness in extremities. Skin: Warm and  dry.  Intact. Neurologic: Alert and oriented Motor: 5/5 in bilateral deltoid, bicep, tricep, grip  Right lower extremity: 3/5 proximal distal, stable examination left lower extremity: 3- to 3/5 proximal to distal, stable examination Psych: pleasant  Assessment/Plan: 1. Functional deficits secondary to cervical myelopathy which require 3+ hours per day of interdisciplinary therapy in a comprehensive inpatient rehab setting.  Physiatrist is providing close team supervision and 24 hour management of active medical problems listed below.  Physiatrist and rehab team continue to assess barriers to discharge/monitor patient progress toward functional and medical goals  Care Tool:  Bathing  Bathing activity did not occur: Refused Body parts bathed by patient: Chest, Abdomen, Left upper leg, Right upper leg, Face, Left arm(from EOB not using long sponge)   Body parts bathed by helper: Front perineal area, Buttocks, Right lower leg, Left upper leg     Bathing assist Assist Level: Moderate Assistance - Patient 50 - 74%     Upper Body Dressing/Undressing Upper body dressing   What is the patient wearing?: Pull over shirt    Upper body assist Assist Level: Minimal Assistance - Patient > 75%    Lower Body Dressing/Undressing Lower body dressing      What is the patient wearing?: Pants     Lower body assist Assist for lower body dressing: Total Assistance - Patient < 25%     Toileting Toileting    Toileting assist Assist for toileting: Total Assistance - Patient < 25%  Transfers Chair/bed transfer  Transfers assist  Chair/bed transfer activity did not occur: Safety/medical concerns  Chair/bed transfer assist level: Minimal Assistance - Patient > 75%(slide board)     Locomotion Ambulation   Ambulation assist   Ambulation activity did not occur: Safety/medical concerns          Walk 10 feet activity   Assist  Walk 10 feet activity did not occur:  Safety/medical concerns        Walk 50 feet activity   Assist Walk 50 feet with 2 turns activity did not occur: Safety/medical concerns         Walk 150 feet activity   Assist Walk 150 feet activity did not occur: Safety/medical concerns         Walk 10 feet on uneven surface  activity   Assist Walk 10 feet on uneven surfaces activity did not occur: Safety/medical concerns         Wheelchair     Assist Will patient use wheelchair at discharge?: Yes Type of Wheelchair: Manual Wheelchair activity did not occur: Safety/medical concerns  Wheelchair assist level: Supervision/Verbal cueing Max wheelchair distance: 150'    Wheelchair 50 feet with 2 turns activity    Assist    Wheelchair 50 feet with 2 turns activity did not occur: Safety/medical concerns   Assist Level: Supervision/Verbal cueing   Wheelchair 150 feet activity     Assist Wheelchair 150 feet activity did not occur: Safety/medical concerns   Assist Level: Supervision/Verbal cueing    Medical Problem List and Plan: 1. Tetraparesis secondary to cervical stenosis myelopathy (c3-6)  s/p C3-6 ACDF  -hx of prior lumbar decompression    -Continue CIR therapies including PT, OT  2. Antithrombotics: -DVT/anticoagulation:Mechanical:Antiembolism stockings, knee (TED hose) Bilateral lower extremities Sequential compression devices, below kneeBilateral lower extremities -antiplatelet therapy: N/A 3. Pain Management:On Cymbalta daily. Continue hydrocodone prn  -improved left pain after injection 3/4 4. Mood:LCSW to follow for evaluation and support. -antipsychotic agents: N/A 5. Neuropsych: This patientiscapable of making decisions on hisown behalf. 6. Skin/Wound Care:Routine pressure relief measures. 7. Fluids/Electrolytes/Nutrition:Monitor I/O.   Potassium 3.5 3/13  -pt is eating more bananas and fruit  -increase kdur to daily, recheck  Monday  -I personally reviewed the patient's labs today.   8. Urinary retention/BPH: Question plans for TURP. Continue Flomax.  - consider voiding trial soon---urology to see during admit at some point.    -foley changed/bladder flushed  -wbc's 4. 1 3/13    9. Anemia of chronic disease: On iron for supplement.  Hemoglobin 9.5 on 3/3--- up to 10.6 3/9---> back to 9.5 3/13 10. Leukopenia: 4.0 11. Morbid obesity: Encourage appropriate diet and weight loss to help promote mobility and health. 12. Neurogenic bowel: working on re-establishing bowel program  -AM program 13. Fever: likely urosepsis  -UA +++  -UCX BCX pending  -cxr unremarkable  -pt with ?mild cough  -continue rocephin 1g q24, pending ucx  -maintain IVF and supportive care.   -reviewed with nursing staff   LOS: 16 days A FACE TO FACE EVALUATION WAS PERFORMED  Ranelle Oyster 12/26/2018, 9:19 AM

## 2018-12-27 ENCOUNTER — Encounter (HOSPITAL_COMMUNITY): Payer: No Typology Code available for payment source | Admitting: Occupational Therapy

## 2018-12-27 MED ORDER — DM-GUAIFENESIN ER 30-600 MG PO TB12
1.0000 | ORAL_TABLET | Freq: Two times a day (BID) | ORAL | Status: DC
  Administered 2018-12-27 – 2018-12-29 (×5): 1 via ORAL
  Filled 2018-12-27 (×5): qty 1

## 2018-12-27 NOTE — Progress Notes (Signed)
Patient refuses potassium pills this morning claims it makes him have diarrhea. Explained to patient about his potassium levels but he said that he eats bananas and drinks his water.

## 2018-12-27 NOTE — Progress Notes (Signed)
[place] PHYSICAL MEDICINE & REHABILITATION PROGRESS NOTE   Subjective/Complaints: Pt feeling better this morning. Was able to sleep better. Does not feel febrile this morning. Has had headache and congestion.   ROS: Patient denies fever, rash, sore throat, blurred vision, nausea, vomiting, diarrhea, cough, shortness of breath or chest pain,  headache, or mood change.    Objective:   Dg Chest Port 1 View  Result Date: 12/25/2018 CLINICAL DATA:  Fever EXAM: PORTABLE CHEST 1 VIEW COMPARISON:  June 28, 2018 FINDINGS: The lungs are clear. Heart is slightly enlarged with pulmonary vascularity normal. No adenopathy. There is aortic atherosclerosis. There is postoperative change in the lower cervical region. IMPRESSION: Mild cardiac enlargement. No edema or consolidation. Aortic Atherosclerosis (ICD10-I70.0). Electronically Signed   By: Bretta Bang III M.D.   On: 12/25/2018 21:35   Recent Labs    12/25/18 0559 12/25/18 2135  WBC 4.0 4.1  HGB 9.5* 9.3*  HCT 30.7* 30.4*  PLT 248 234   Recent Labs    12/25/18 0559 12/25/18 2135  NA 138 137  K 3.5 3.4*  CL 106 110  CO2 27 23  GLUCOSE 97 110*  BUN 12 14  CREATININE 0.82 0.88  CALCIUM 8.7* 8.3*    Intake/Output Summary (Last 24 hours) at 12/27/2018 0939 Last data filed at 12/27/2018 0700 Gross per 24 hour  Intake 2839.13 ml  Output 2375 ml  Net 464.13 ml     Physical Exam: Vital Signs Blood pressure 125/86, pulse 66, temperature 97.8 F (36.6 C), temperature source Oral, resp. rate 20, height 5\' 10"  (1.778 m), weight (!) 139 kg, SpO2 99 %.  Constitutional: No distress . Vital signs reviewed. HEENT: EOMI, oral membranes moist. Does not look or sound congested Neck: supple Cardiovascular: RRR without murmur. No JVD    Respiratory: CTA Bilaterally without wheezes or rales. Normal effort    GI: BS +, non-tender, non-distended  Uro: yellow, no obvious sediment Musc: No edema or tenderness in extremities. Skin:  Warm and dry.  Intact. Neurologic: Alert and oriented Motor: 5/5 in bilateral deltoid, bicep, tricep, grip  Right lower extremity: 3/5 proximal distal, stable examination left lower extremity: 3- to 3/5 proximal to distal, stable examination Psych: pleasant  Assessment/Plan: 1. Functional deficits secondary to cervical myelopathy which require 3+ hours per day of interdisciplinary therapy in a comprehensive inpatient rehab setting.  Physiatrist is providing close team supervision and 24 hour management of active medical problems listed below.  Physiatrist and rehab team continue to assess barriers to discharge/monitor patient progress toward functional and medical goals  Care Tool:  Bathing  Bathing activity did not occur: Refused Body parts bathed by patient: Chest, Abdomen, Left upper leg, Right upper leg, Face, Left arm(from EOB not using long sponge)   Body parts bathed by helper: Front perineal area, Buttocks, Right lower leg, Left upper leg     Bathing assist Assist Level: Moderate Assistance - Patient 50 - 74%     Upper Body Dressing/Undressing Upper body dressing   What is the patient wearing?: Pull over shirt    Upper body assist Assist Level: Minimal Assistance - Patient > 75%    Lower Body Dressing/Undressing Lower body dressing      What is the patient wearing?: Pants     Lower body assist Assist for lower body dressing: Total Assistance - Patient < 25%     Toileting Toileting    Toileting assist Assist for toileting: Total Assistance - Patient < 25%  Transfers Chair/bed transfer  Transfers assist  Chair/bed transfer activity did not occur: Safety/medical concerns  Chair/bed transfer assist level: Minimal Assistance - Patient > 75%(slide board)     Locomotion Ambulation   Ambulation assist   Ambulation activity did not occur: Safety/medical concerns          Walk 10 feet activity   Assist  Walk 10 feet activity did not occur:  Safety/medical concerns        Walk 50 feet activity   Assist Walk 50 feet with 2 turns activity did not occur: Safety/medical concerns         Walk 150 feet activity   Assist Walk 150 feet activity did not occur: Safety/medical concerns         Walk 10 feet on uneven surface  activity   Assist Walk 10 feet on uneven surfaces activity did not occur: Safety/medical concerns         Wheelchair     Assist Will patient use wheelchair at discharge?: Yes Type of Wheelchair: Manual Wheelchair activity did not occur: Safety/medical concerns  Wheelchair assist level: Supervision/Verbal cueing Max wheelchair distance: 150'    Wheelchair 50 feet with 2 turns activity    Assist    Wheelchair 50 feet with 2 turns activity did not occur: Safety/medical concerns   Assist Level: Supervision/Verbal cueing   Wheelchair 150 feet activity     Assist Wheelchair 150 feet activity did not occur: Safety/medical concerns   Assist Level: Supervision/Verbal cueing    Medical Problem List and Plan: 1. Tetraparesis secondary to cervical stenosis myelopathy (c3-6)  s/p C3-6 ACDF  -hx of prior lumbar decompression    -Continue CIR therapies including PT, OT  2. Antithrombotics: -DVT/anticoagulation:Mechanical:Antiembolism stockings, knee (TED hose) Bilateral lower extremities Sequential compression devices, below kneeBilateral lower extremities -antiplatelet therapy: N/A 3. Pain Management:On Cymbalta daily. Continue hydrocodone prn  -improved left pain after injection 3/4 4. Mood:LCSW to follow for evaluation and support. -antipsychotic agents: N/A 5. Neuropsych: This patientiscapable of making decisions on hisown behalf. 6. Skin/Wound Care:Routine pressure relief measures. 7. Fluids/Electrolytes/Nutrition:Monitor I/O.   Potassium 3.5 3/13  -pt is eating more bananas and fruit  -increase kdur to daily, recheck  Monday  -I personally reviewed the patient's labs today.   8. Urinary retention/BPH: Question plans for TURP. Continue Flomax.  - consider voiding trial soon---urology to see during admit at some point.    -foley changed this week  -wbc's 4. 1 3/13    9. Anemia of chronic disease: On iron for supplement.  Hemoglobin 9.5 on 3/3--- up to 10.6 3/9---> back to 9.5 3/13 10. Leukopenia: 4.0 11. Morbid obesity: Encourage appropriate diet and weight loss to help promote mobility and health. 12. Neurogenic bowel: working on re-establishing bowel program  -AM program 13. Fever: likely urosepsis  -UA +++  -UCX STILL pending  -BCX neg so far  -cxr unremarkable  -pt with ?mild cough which I don't think is related to fevers  -continue rocephin 1g q24, pending ucx  -dc IVF.   -add mucinex for cough  -follow up labs in am  LOS: 17 days A FACE TO FACE EVALUATION WAS PERFORMED  Ranelle Oyster 12/27/2018, 9:39 AM

## 2018-12-27 NOTE — Plan of Care (Signed)
  Problem: RH PAIN MANAGEMENT Goal: RH STG PAIN MANAGED AT OR BELOW PT'S PAIN GOAL Description Pain scale <4/10  Outcome: Not Progressing; c/o of constant h/a rt side; meds for congestion given

## 2018-12-27 NOTE — Progress Notes (Signed)
Occupational Therapy Session Note  Patient Details  Name: Alekxander Grossberg MRN: [id number] Date of Birth: 1956-04-16  Today's Date: 12/27/2018 OT Missed Time: 60 Minutes Missed Time Reason: Patient ill (comment)(HA)  Skilled Therapeutic Interventions/Progress Updates:    Pt refused group tx due to HA. RN already aware. 60 minutes missed.   Therapy Documentation Precautions:  Precautions Precautions: Cervical, Fall Precaution Comments: reviewed cervical precautions with patient, min cueing to recall   Required Braces or Orthoses: Cervical Brace Cervical Brace: Soft collar Restrictions Weight Bearing Restrictions: No Pain: Pain Assessment Pain Score: 2  ADL: ADL Grooming: Supervision/safety Where Assessed-Grooming: Other (comment)(standing in [place]) Upper Body Bathing: Supervision/safety Where Assessed-Upper Body Bathing: Other (Comment)(semi perched in [place]) Lower Body Bathing: Moderate assistance Where Assessed-Lower Body Bathing: Standing at sink, Sitting at sink(using Bariatric Stedy) Upper Body Dressing: Moderate assistance Where Assessed-Upper Body Dressing: Sitting at sink Lower Body Dressing: Moderate assistance Where Assessed-Lower Body Dressing: Wheelchair, Standing at sink, Sitting at sink(using Geralyn Corwin) Toileting: Other (Comment)(Sit<stand in [place]- 2 assist) Toilet Transfer: Other (comment)(2 assist) Toilet Transfer Method: Stand pivot Toilet Transfer Equipment: Bedside commode Tub/Shower Transfer: Not assessed     Therapy/Group: Group Therapy  Zanetta Dehaan A Maragret Vanacker 12/27/2018, 2:48 PM

## 2018-12-27 NOTE — Progress Notes (Signed)
Urine culture preliminary results  posted called to dr. Riley Kill per microbiology final results will be late tonite or tomorrow.

## 2018-12-28 ENCOUNTER — Inpatient Hospital Stay (HOSPITAL_COMMUNITY): Payer: No Typology Code available for payment source | Admitting: Occupational Therapy

## 2018-12-28 ENCOUNTER — Inpatient Hospital Stay (HOSPITAL_COMMUNITY): Payer: No Typology Code available for payment source | Admitting: Physical Therapy

## 2018-12-28 LAB — CBC
HCT: 34.4 % — ABNORMAL LOW (ref 39.0–52.0)
Hemoglobin: 10.8 g/dL — ABNORMAL LOW (ref 13.0–17.0)
MCH: 24.3 pg — ABNORMAL LOW (ref 26.0–34.0)
MCHC: 31.4 g/dL (ref 30.0–36.0)
MCV: 77.5 fL — ABNORMAL LOW (ref 80.0–100.0)
Platelets: 233 10*3/uL (ref 150–400)
RBC: 4.44 MIL/uL (ref 4.22–5.81)
RDW: 16.4 % — ABNORMAL HIGH (ref 11.5–15.5)
WBC: 4.6 10*3/uL (ref 4.0–10.5)
nRBC: 0 % (ref 0.0–0.2)

## 2018-12-28 LAB — BASIC METABOLIC PANEL
Anion gap: 8 (ref 5–15)
BUN: 6 mg/dL — AB (ref 8–23)
CO2: 25 mmol/L (ref 22–32)
Calcium: 8.7 mg/dL — ABNORMAL LOW (ref 8.9–10.3)
Chloride: 103 mmol/L (ref 98–111)
Creatinine, Ser: 0.64 mg/dL (ref 0.61–1.24)
GFR calc Af Amer: >60 mL/min (ref >60)
GFR calc non Af Amer: >60 mL/min (ref >60)
Glucose, Bld: 91 mg/dL (ref 70–99)
Potassium: 3.3 mmol/L — ABNORMAL LOW (ref 3.5–5.1)
SODIUM: 136 mmol/L (ref 135–145)

## 2018-12-28 LAB — URINE CULTURE: Culture: >=100000 — AB

## 2018-12-28 NOTE — Progress Notes (Addendum)
[place] PHYSICAL MEDICINE & REHABILITATION PROGRESS NOTE   Subjective/Complaints: Pt feeling much better. Left knee sore. Still with occasional cough  ROS: Patient denies fever, rash, sore throat, blurred vision, nausea, vomiting, diarrhea, cough, shortness of breath or chest pain,   headache, or mood change.   Objective:   No results found. Recent Labs    12/25/18 2135 12/28/18 0635  WBC 4.1 4.6  HGB 9.3* 10.8*  HCT 30.4* 34.4*  PLT 234 233   Recent Labs    12/25/18 2135 12/28/18 0635  NA 137 136  K 3.4* 3.3*  CL 110 103  CO2 23 25  GLUCOSE 110* 91  BUN 14 6*  CREATININE 0.88 0.64  CALCIUM 8.3* 8.7*    Intake/Output Summary (Last 24 hours) at 12/28/2018 0847 Last data filed at 12/28/2018 0800 Gross per 24 hour  Intake 820 ml  Output 2450 ml  Net -1630 ml     Physical Exam: Vital Signs Blood pressure (!) 147/86, pulse 70, temperature 98.1 F (36.7 C), temperature source Oral, resp. rate 16, height 5\' 10"  (1.778 m), weight (!) 139 kg, SpO2 99 %.  Constitutional: No distress . Vital signs reviewed. HEENT: EOMI, oral membranes moist Neck: supple Cardiovascular: RRR without murmur. No JVD    Respiratory: CTA Bilaterally without wheezes or rales. Normal effort    GI: BS +, non-tender, non-distended  Uro: yellow, no obvious sediment Musc: No edema or tenderness in extremities. Right knee sensitive to touch Skin: Warm and dry.  Intact. Neurologic: Alert and oriented Motor: 5/5 in bilateral deltoid, bicep, tricep, grip  Right lower extremity: 3/5 proximal distal, stable examination left lower extremity: 3- to 3/5 proximal to distal, some inhibition d/t knee pain Psych: appropriate  Assessment/Plan: 1. Functional deficits secondary to cervical myelopathy which require 3+ hours per day of interdisciplinary therapy in a comprehensive inpatient rehab setting.  Physiatrist is providing close team supervision and 24 hour management of active medical problems  listed below.  Physiatrist and rehab team continue to assess barriers to discharge/monitor patient progress toward functional and medical goals  Care Tool:  Bathing  Bathing activity did not occur: Refused Body parts bathed by patient: Chest, Abdomen, Left upper leg, Right upper leg, Face, Left arm(from EOB not using long sponge)   Body parts bathed by helper: Front perineal area, Buttocks, Right lower leg, Left upper leg     Bathing assist Assist Level: Moderate Assistance - Patient 50 - 74%     Upper Body Dressing/Undressing Upper body dressing   What is the patient wearing?: Pull over shirt    Upper body assist Assist Level: Minimal Assistance - Patient > 75%    Lower Body Dressing/Undressing Lower body dressing      What is the patient wearing?: Pants     Lower body assist Assist for lower body dressing: Total Assistance - Patient < 25%     Toileting Toileting    Toileting assist Assist for toileting: Total Assistance - Patient < 25%     Transfers Chair/bed transfer  Transfers assist  Chair/bed transfer activity did not occur: Safety/medical concerns  Chair/bed transfer assist level: Minimal Assistance - Patient > 75%(slide board)     Locomotion Ambulation   Ambulation assist   Ambulation activity did not occur: Safety/medical concerns          Walk 10 feet activity   Assist  Walk 10 feet activity did not occur: Safety/medical concerns        Walk 50 feet activity  Assist Walk 50 feet with 2 turns activity did not occur: Safety/medical concerns         Walk 150 feet activity   Assist Walk 150 feet activity did not occur: Safety/medical concerns         Walk 10 feet on uneven surface  activity   Assist Walk 10 feet on uneven surfaces activity did not occur: Safety/medical concerns         Wheelchair     Assist Will patient use wheelchair at discharge?: Yes Type of Wheelchair: Manual Wheelchair activity did not  occur: Safety/medical concerns  Wheelchair assist level: Supervision/Verbal cueing Max wheelchair distance: 150'    Wheelchair 50 feet with 2 turns activity    Assist    Wheelchair 50 feet with 2 turns activity did not occur: Safety/medical concerns   Assist Level: Supervision/Verbal cueing   Wheelchair 150 feet activity     Assist Wheelchair 150 feet activity did not occur: Safety/medical concerns   Assist Level: Supervision/Verbal cueing    Medical Problem List and Plan: 1. Tetraparesis secondary to cervical stenosis myelopathy (c3-6)  s/p C3-6 ACDF  -hx of prior lumbar decompression    -Continue CIR therapies including PT, OT   2. Antithrombotics:  -DVT/anticoagulation:Mechanical:Antiembolism stockings, knee (TED hose) Bilateral lower extremities Sequential compression devices, below kneeBilateral lower extremities -antiplatelet therapy: N/A 3. Pain Management:On Cymbalta daily. Continue hydrocodone prn  -improved left pain after injection 3/4 4. Mood:LCSW to follow for evaluation and support. -antipsychotic agents: N/A 5. Neuropsych: This patientiscapable of making decisions on hisown behalf. 6. Skin/Wound Care:Routine pressure relief measures. 7. Fluids/Electrolytes/Nutrition:Monitor I/O.   Potassium down to 3.3 today  -pt is eating more bananas and fruit  -increase kdur to BID  I personally reviewed the patient's labs today.     8. Urinary retention/BPH: Question plans for TURP. Continue Flomax.  - consider voiding trial soon---urology to see during admit at some point.    -foley changed this week  -wbc's 4.6 3/16    9. Anemia of chronic disease: On iron for supplement.  Hemoglobin stable at 10.8 3/16 10. Leukopenia: 4.0 11. Morbid obesity: Encourage appropriate diet and weight loss to help promote mobility and health. 12. Neurogenic bowel: working on re-establishing bowel program  -maintain AM program 13.  Fever: Ecoli urosepsis  -continue iv rocephin day #4 today, if no issues today, change to oral 3/17  -BCX neg so far  -cxr unremarkable  -pt with ?mild cough which I don't think is related to fevers  -continue ucinex for cough   -clinically appears improved  LOS: 18 days A FACE TO FACE EVALUATION WAS PERFORMED  Ranelle Oyster 12/28/2018, 8:47 AM

## 2018-12-28 NOTE — Progress Notes (Signed)
Physical Therapy Discharge Summary  Patient Details  Name: [name] [name] MRN: [id number] Date of Birth: 1956/01/04  Today's Date: 12/28/2018   Patient has met 5 of 5 long term goals due to improved activity tolerance, increased strength and ability to compensate for deficits.  Patient to discharge at a wheelchair level [place] overall for bed mobility and transfers. Pt is mod I for w/c mobility with assist for management of w/c parts. Pt is still max A for sit to stand transfers to stedy and will continue to work on standing with home health therapy. Pt is able to complete slide board transfers with min A currently.   Patient's care partner is independent to provide the necessary physical assistance at discharge. Pt's wife [name] [name] has been present during therapy sessions and was assisting pt at home with bed mobility and transfers prior to this admission. Pt and his wife feel comfortable with assist level needed at home.  Reasons goals not met: D/c car transfer goal due to patient not performing a car transfer prior to admission. Pt utilized a transportation service previously and plans to continue to do so. Pt met all other rehab goals.  Recommendation:  Patient will benefit from ongoing skilled PT services in home health setting to continue to advance safe functional mobility, address ongoing impairments in strength, endurance, independence with functional mobility, pain management, and minimize fall risk.  Equipment: No equipment provided. Pt already owns all necessary equipment.  Reasons for discharge: treatment goals met and discharge from hospital  Patient/family agrees with progress made and goals achieved: Yes   Skilled Intervention: Attempted to see patient for scheduled therapy session, pt declined participation due to feeling comfortable with how to perform transfers and mobility at home. Pt declines any further education. Pt missed 60 min of scheduled therapy session due to  refusal. Pt has met all goals and is safe to d/c home from rehab.  PT Discharge Precautions/Restrictions Precautions Precautions: Cervical;Fall Required Braces or Orthoses: Cervical Brace Cervical Brace: Soft collar Restrictions Weight Bearing Restrictions: No Vision/Perception  Perception Perception: Within Functional Limits Praxis Praxis: Intact  Cognition Overall Cognitive Status: Within Functional Limits for tasks assessed Arousal/Alertness: Awake/alert Orientation Level: Oriented X4 Attention: Sustained Sustained Attention: Appears intact Memory: Appears intact Awareness: Appears intact Problem Solving: Appears intact Safety/Judgment: Appears intact Sensation Sensation Light Touch: Impaired Detail Light Touch Impaired Details: Impaired RLE;Impaired LLE Proprioception: Impaired Detail Proprioception Impaired Details: Impaired RLE;Impaired LLE Coordination Gross Motor Movements are Fluid and Coordinated: No Fine Motor Movements are Fluid and Coordinated: No Coordination and Movement Description: impaired by paraparesis Heel Shin Test: unable to assess 2/2 weakness Motor  Motor Motor: Paraplegia Motor - Discharge Observations: BLE weakness 2/2 paraplegia, improved since eval  Mobility Bed Mobility Bed Mobility: Rolling Right;Rolling Left;Supine to Sit;Sit to Supine Rolling Right: Minimal Assistance - Patient > 75% Rolling Left: Minimal Assistance - Patient > 75% Supine to Sit: Moderate Assistance - Patient 50-74% Sit to Supine: Moderate Assistance - Patient 50-74% Transfers Transfers: Sit to Stand;Lateral/Scoot Transfers Sit to Stand: Maximal Assistance - Patient 25-49% Lateral/Scoot Transfers: Minimal Assistance - Patient > 75% Transfer (Assistive device): Other (Comment)(slide board) Transfer via Lift Equipment: Animal nutritionist: No Gait Gait: No Stairs / Additional Locomotion Stairs: No Architect:  Yes Wheelchair Assistance: Chartered loss adjuster: Both upper extremities;Left lower extremity Wheelchair Parts Management: Needs assistance Distance: 150  Trunk/Postural Assessment  Cervical Assessment Cervical Assessment: Exceptions to WFL(cervical precautions) Thoracic Assessment Thoracic Assessment: Exceptions to  WFL(rounded shoulders) Lumbar Assessment Lumbar Assessment: Exceptions to WFL(posterior pelvic tilt) Postural Control Postural Control: Within Functional Limits  Balance Balance Balance Assessed: Yes Static Sitting Balance Static Sitting - Balance Support: Feet supported;No upper extremity supported Static Sitting - Level of Assistance: 5: Stand by assistance Dynamic Sitting Balance Dynamic Sitting - Balance Support: Feet supported;During functional activity Dynamic Sitting - Level of Assistance: 5: Stand by assistance Static Standing Balance Static Standing - Balance Support: Bilateral upper extremity supported;During functional activity Static Standing - Level of Assistance: 4: Min assist Extremity Assessment   RLE Assessment RLE Assessment: Exceptions to [facility] General Strength Comments: impaired, see below RLE Strength Right Hip Flexion: 3+/5 Right Knee Flexion: 3/5 Right Knee Extension: 3/5 Right Ankle Dorsiflexion: 0/5 LLE Assessment LLE Assessment: Exceptions to [facility] Passive Range of Motion (PROM) Comments: limited knee ROM General Strength Comments: impaired, see below LLE Strength Left Hip Flexion: 5/5 Left Knee Flexion: 3+/5 Left Knee Extension: 3+/5 Left Ankle Dorsiflexion: 3/5     [name] [name], PT, DPT 12/28/2018, 3:56 PM

## 2018-12-28 NOTE — Progress Notes (Signed)
Occupational Therapy Session Note  Patient Details  Name: [name] [name] MRN: [id number] Date of Birth: May 12, 1956  Today's Date: 12/28/2018 OT Individual Time: 1500-1509 OT Individual Time Calculation (min): 9 min  and Today's Date: 12/28/2018 OT Missed Time: 51 Minutes Missed Time Reason: Patient unwilling/refused to participate without medical reason   Short Term Goals: Week 2:  OT Short Term Goal 1 (Week 2): STGs=LTGs due to ELOS  Skilled Therapeutic Interventions/Progress Updates:    Upon entering the room, pt seated in wheelchair with wife present in the room. Pt verbalized feeling ready for discharge and wife reports having no concerns at this time. Pt declined OT intervention secondary to " I'm gonna rest since I'm going home tomorrow." OT attempted to encourage pt to participate but pt continued to refuse. Call bell and all needed items within reach upon exiting the room.   Therapy Documentation Precautions:  Precautions Precautions: Cervical, Fall Precaution Comments: reviewed cervical precautions with patient, min cueing to recall   Required Braces or Orthoses: Cervical Brace Cervical Brace: Soft collar Restrictions Weight Bearing Restrictions: No General: General OT Amount of Missed Time: 51 Minutes PT Missed Treatment Reason: Patient unwilling to participate Vital Signs: Therapy Vitals Temp: 98.7 F (37.1 C) Temp Source: Oral Pulse Rate: 82 Resp: 19 BP: 126/65 Patient Position (if appropriate): Sitting Oxygen Therapy SpO2: 97 % O2 Device: Room Air Pain:   ADL: ADL Grooming: Setup Where Assessed-Grooming: Sitting at sink Upper Body Bathing: Supervision/safety Where Assessed-Upper Body Bathing: Edge of bed Lower Body Bathing: Moderate assistance Where Assessed-Lower Body Bathing: Bed level, Edge of bed Upper Body Dressing: Minimal assistance Where Assessed-Upper Body Dressing: Edge of bed Lower Body Dressing: Moderate assistance Where Assessed-Lower Body  Dressing: Edge of bed, Bed level Toileting: Maximal assistance Where Assessed-Toileting: Bed level Toilet Transfer: Dependent(goal d/c. Transfer using mechanical lift) Toilet Transfer Method: Stand pivot Toilet Transfer Equipment: Animator Transfer: Not assessed Film/video editor: Dependent(Goal d/c. Transfer completed with mechanical lift) Perception  Perception: Within Functional Limits Praxis Praxis: Intact   Therapy/Group: Individual Therapy  Alen Bleacher 12/28/2018, 3:23 PM

## 2018-12-28 NOTE — Progress Notes (Signed)
Occupational Therapy Discharge Summary  Patient Details  Name: [name] [name] MRN: [id number] Date of Birth: [birth year]  Today's Date: 12/28/2018 OT Individual Time:  - 5956-3875 Individual Treatment Time Calculation: 89 min    Patient has met 5 of 6 long term goals due to improved activity tolerance, improved balance and ability to compensate for deficits.  Patient to discharge at overall Mod Assist level.  Patient's care partner Rise Paganini has verbalized that she can provide this ADL assist, and was using the [place] lift mostly at home. Both she and pt expressed that functional bathroom transfers were not yet a goal for them, and therefore toilet and shower transfer goals were previously d/c. Pt and spouse stated they plan to resume their bedlevel ADL routine but completing UB self care EOB as per recommended by OT.   Due to body habitus and LE weakness, pt unable to complete sit<stand without use of mechanical lift. Therefore sit<stand goal was unable to be met.   Recommendation:  Patient will benefit from ongoing skilled OT services in home health setting to continue to advance functional skills in the area of BADL.  Equipment: No equipment provided  Reasons for discharge: discharge from hospital  Patient/family agrees with progress made and goals achieved: Yes   Skilled Therapeutic Intervention:  Pt greeted in bed and amenable to tx. Supine<sit with Mod A with HOB elevated and use of bedrails. He completed bathing/dressing EOB and bedlevel per routine at home. During bathing, pt used LH sponge to wash LEs and frontal periarea. Pt rolled Rt>Lt with Min A towards Rt and supervision towards Lt while OT washed buttocks and donned brief. When he returned to EOB, pt donned shirt and used reacher and shoe funnel to don pants and sneakers with supervision. Assist required for Teds. Min A for donning overhead shirt with pt declining to don soft c collar after. Min A for slideboard<w/c, and then he  completed oral care/grooming tasks while seated at sink with setup. Discussed with pt OT goal achievement during CIR stay. Also discussed that if pt wants to pursue toilet transfer and shower transfer goals in the future, to seek education from home health therpies before attempting. He verbalized understanding. At end of session pt was left in w/c with all needs within reach.   Pt seen for 2nd OT session after lunch, with pt refusing to participate without medical reason: "I'm not leaving my room until tomorrow when I leave the hospital." 45 minutes missed.   OT Discharge Precautions/Restrictions  Precautions Precautions: Cervical;Fall Required Braces or Orthoses: Cervical Brace Cervical Brace: Soft collar Vital Signs Therapy Vitals Temp: 98.1 F (36.7 C) Temp Source: Oral Pulse Rate: 70 Resp: 16 BP: (!) 147/86 Patient Position (if appropriate): Lying Oxygen Therapy SpO2: 99 % O2 Device: Room Air Pain: RN made aware of pts request for pain medication at start of session, and RN provided medication during tx    ADL ADL Grooming: Setup Where Assessed-Grooming: Sitting at sink Upper Body Bathing: Supervision/safety Where Assessed-Upper Body Bathing: Edge of bed Lower Body Bathing: Moderate assistance Where Assessed-Lower Body Bathing: Bed level, Edge of bed Upper Body Dressing: Minimal assistance Where Assessed-Upper Body Dressing: Edge of bed Lower Body Dressing: Moderate assistance Where Assessed-Lower Body Dressing: Edge of bed, Bed level Toileting: Maximal assistance Where Assessed-Toileting: Bed level Toilet Transfer: Dependent(goal d/c. Transfer using mechanical lift) Toilet Transfer Method: Stand pivot Toilet Transfer Equipment: Engineer, technical sales Transfer: Not assessed Social research officer, government: Dependent(Goal d/c. Transfer completed with mechanical lift) Vision  Baseline Vision/History: Wears glasses Wears Glasses: Reading only Patient Visual Report: No  change from baseline Vision Assessment?: No apparent visual deficits Perception  Perception: Within Functional Limits Praxis Praxis: Intact Cognition Overall Cognitive Status: Within Functional Limits for tasks assessed Arousal/Alertness: Awake/alert Orientation Level: Oriented X4 Sustained Attention: Appears intact Memory: Appears intact Awareness: Appears intact Problem Solving: Appears intact Safety/Judgment: Appears intact Sensation Sensation Light Touch Impaired Details: Impaired RLE;Impaired LLE Coordination Gross Motor Movements are Fluid and Coordinated: No Fine Motor Movements are Fluid and Coordinated: No Coordination and Movement Description: Impacted by parapresis. Fine motor abilities affected by ROM limitations Lt digits  Motor  Motor Motor: Paraplegia Motor - Discharge Observations: Paraplegia persists, but strength improved since time of eval Mobility    Min A slideboard transfers  Trunk/Postural Assessment  Cervical Assessment Cervical Assessment: Exceptions to WFL(cervical precautions) Thoracic Assessment Thoracic Assessment: Exceptions to WFL(rounded shoulders) Lumbar Assessment Lumbar Assessment: Exceptions to WFL(posterior pelvic tilt) Postural Control Postural Control: Within Functional Limits  Balance Balance Balance Assessed: Yes Dynamic Sitting Balance Dynamic Sitting - Balance Support: Feet supported;During functional activity(LB dressing EOB using AE) Dynamic Sitting - Level of Assistance: 5: Stand by assistance Extremity/Trunk Assessment RUE Assessment RUE Assessment: Exceptions to [facility] Active Range of Motion (AROM) Comments: About 80 degrees shoulder flexion/abduction. Limited internal rotation  General Strength Comments: 3-/5 grossly  RUE Body System: Ortho(arthritis in shoulder) LUE Assessment LUE Assessment: Exceptions to [facility] Passive Range of Motion (PROM) Comments: 160 degrees shoulder flexion, 110 degrees shoulder abduction  Active  Range of Motion (AROM) Comments: 60-70 degrees shoulder flexion/abduction. Limited digit ROM (very painful with movement) General Strength Comments: 3-/5 grossly  LUE Body System: Ortho   Skeet Simmer 12/28/2018, 9:18 AM

## 2018-12-29 DIAGNOSIS — N390 Urinary tract infection, site not specified: Secondary | ICD-10-CM

## 2018-12-29 DIAGNOSIS — B9620 Unspecified Escherichia coli [E. coli] as the cause of diseases classified elsewhere: Secondary | ICD-10-CM

## 2018-12-29 MED ORDER — LIDOCAINE HCL URETHRAL/MUCOSAL 2 % EX GEL: 1.0000 "application " | CUTANEOUS | 0 refills | Status: DC | PRN

## 2018-12-29 MED ORDER — DULOXETINE HCL 60 MG PO CPEP: 60.0000 mg | ORAL_CAPSULE | Freq: Every day | ORAL | 0 refills | Status: DC

## 2018-12-29 MED ORDER — SULFAMETHOXAZOLE-TRIMETHOPRIM 800-160 MG PO TABS: 1.0000 | ORAL_TABLET | Freq: Two times a day (BID) | ORAL | 0 refills | Status: DC

## 2018-12-29 MED ORDER — CEPHALEXIN 250 MG PO CAPS
250.0000 mg | ORAL_CAPSULE | Freq: Three times a day (TID) | ORAL | Status: DC
  Administered 2018-12-29: 250 mg via ORAL
  Filled 2018-12-29: qty 1

## 2018-12-29 MED ORDER — CEPHALEXIN 250 MG PO CAPS: 250.0000 mg | ORAL_CAPSULE | Freq: Three times a day (TID) | ORAL | 0 refills | Status: DC

## 2018-12-29 MED ORDER — PANTOPRAZOLE SODIUM 40 MG PO TBEC: 40.0000 mg | DELAYED_RELEASE_TABLET | Freq: Two times a day (BID) | ORAL | 0 refills | Status: DC

## 2018-12-29 MED ORDER — BISACODYL 10 MG RE SUPP: 10.0000 mg | Freq: Every day | RECTAL | 0 refills | Status: DC

## 2018-12-29 MED ORDER — METHOCARBAMOL 500 MG PO TABS: 500.0000 mg | ORAL_TABLET | Freq: Four times a day (QID) | ORAL | 0 refills | Status: DC | PRN

## 2018-12-29 MED ORDER — FERROUS GLUCONATE 324 (38 FE) MG PO TABS: 324.0000 mg | ORAL_TABLET | Freq: Three times a day (TID) | ORAL | 0 refills | Status: DC

## 2018-12-29 MED ORDER — POTASSIUM CHLORIDE CRYS ER 20 MEQ PO TBCR: 20.0000 meq | EXTENDED_RELEASE_TABLET | Freq: Two times a day (BID) | ORAL | 0 refills | Status: DC

## 2018-12-29 MED ORDER — SENNOSIDES-DOCUSATE SODIUM 8.6-50 MG PO TABS: 2.0000 | ORAL_TABLET | Freq: Every day | ORAL | 0 refills | Status: DC

## 2018-12-29 MED ORDER — HYDROCODONE-ACETAMINOPHEN 7.5-325 MG PO TABS: 1.0000 | ORAL_TABLET | Freq: Three times a day (TID) | ORAL | 0 refills | Status: DC | PRN

## 2018-12-29 MED ORDER — TAMSULOSIN HCL 0.4 MG PO CAPS: 0.4000 mg | ORAL_CAPSULE | Freq: Every day | ORAL | 0 refills | Status: DC

## 2018-12-29 NOTE — Progress Notes (Signed)
[facility] PHYSICAL MEDICINE & REHABILITATION PROGRESS NOTE   Subjective/Complaints: Pt feels "back to normal". Excited about going home today  ROS: Patient denies fever, rash, sore throat, blurred vision, nausea, vomiting, diarrhea, cough, shortness of breath or chest pain,   or mood change.   Objective:   No results found. Recent Labs    12/28/18 0635  WBC 4.6  HGB 10.8*  HCT 34.4*  PLT 233   Recent Labs    12/28/18 0635  NA 136  K 3.3*  CL 103  CO2 25  GLUCOSE 91  BUN 6*  CREATININE 0.64  CALCIUM 8.7*    Intake/Output Summary (Last 24 hours) at 12/29/2018 0910 Last data filed at 12/29/2018 0520 Gross per 24 hour  Intake 360 ml  Output 1625 ml  Net -1265 ml     Physical Exam: Vital Signs Blood pressure 128/83, pulse 82, temperature 97.8 F (36.6 C), temperature source Oral, resp. rate 15, height 5\' 10"  (1.778 m), weight (!) 139 kg, SpO2 97 %.  Constitutional: No distress . Vital signs reviewed. HEENT: EOMI, oral membranes moist Neck: supple Cardiovascular: RRR without murmur. No JVD    Respiratory: CTA Bilaterally without wheezes or rales. Normal effort    GI: BS +, non-tender, non-distended  Musc: No edema or tenderness in extremities. Right knee sensitive to touch Skin: Warm and dry.  Intact. Neurologic: Alert and oriented Motor: 5/5 in bilateral deltoid, bicep, tricep, grip  Right lower extremity: 3/5 proximal distal, stable examination left lower extremity: 3- to 3/5 proximal to distal, some inhibition remains d/t knee pain Psych: appropriate  Assessment/Plan: 1. Functional deficits secondary to cervical myelopathy which require 3+ hours per day of interdisciplinary therapy in a comprehensive inpatient rehab setting.  Physiatrist is providing close team supervision and 24 hour management of active medical problems listed below.  Physiatrist and rehab team continue to assess barriers to discharge/monitor patient progress toward functional and  medical goals  Care Tool:  Bathing  Bathing activity did not occur: Refused Body parts bathed by patient: Chest, Abdomen, Left upper leg, Right upper leg, Face, Left arm, Right lower leg, Left lower leg, Front perineal area, Right arm   Body parts bathed by helper: Buttocks     Bathing assist Assist Level: Moderate Assistance - Patient 50 - 74%     Upper Body Dressing/Undressing Upper body dressing   What is the patient wearing?: Pull over shirt    Upper body assist Assist Level: Minimal Assistance - Patient > 75%    Lower Body Dressing/Undressing Lower body dressing      What is the patient wearing?: Pants     Lower body assist Assist for lower body dressing: Moderate Assistance - Patient 50 - 74%(using AE as needed)     Toileting Toileting    Toileting assist Assist for toileting: Maximal Assistance - Patient 25 - 49%     Transfers Chair/bed transfer  Transfers assist  Chair/bed transfer activity did not occur: Safety/medical concerns  Chair/bed transfer assist level: Minimal Assistance - Patient > 75%(slide board)     Locomotion Ambulation   Ambulation assist   Ambulation activity did not occur: Safety/medical concerns          Walk 10 feet activity   Assist  Walk 10 feet activity did not occur: Safety/medical concerns        Walk 50 feet activity   Assist Walk 50 feet with 2 turns activity did not occur: Safety/medical concerns  Walk 150 feet activity   Assist Walk 150 feet activity did not occur: Safety/medical concerns         Walk 10 feet on uneven surface  activity   Assist Walk 10 feet on uneven surfaces activity did not occur: Safety/medical concerns         Wheelchair     Assist Will patient use wheelchair at discharge?: Yes Type of Wheelchair: Manual Wheelchair activity did not occur: Safety/medical concerns  Wheelchair assist level: Supervision/Verbal cueing Max wheelchair distance: 150'     Wheelchair 50 feet with 2 turns activity    Assist    Wheelchair 50 feet with 2 turns activity did not occur: Safety/medical concerns   Assist Level: Supervision/Verbal cueing   Wheelchair 150 feet activity     Assist Wheelchair 150 feet activity did not occur: Safety/medical concerns   Assist Level: Supervision/Verbal cueing    Medical Problem List and Plan: 1. Tetraparesis secondary to cervical stenosis myelopathy (c3-6)  s/p C3-6 ACDF  -hx of prior lumbar decompression    -dc home today  -Patient to see Rehab MD/provider in the office for transitional care encounter in 1-2 weeks.    2. Antithrombotics:  -DVT/anticoagulation:Mechanical:Antiembolism stockings, knee (TED hose) Bilateral lower extremities Sequential compression devices, below kneeBilateral lower extremities -antiplatelet therapy: N/A 3. Pain Management:On Cymbalta daily. Continue hydrocodone prn  -improved left pain after injection 3/4 4. Mood:LCSW to follow for evaluation and support. -antipsychotic agents: N/A 5. Neuropsych: This patientiscapable of making decisions on hisown behalf. 6. Skin/Wound Care:Routine pressure relief measures. 7. Fluids/Electrolytes/Nutrition:Monitor I/O.   Potassium down to 3.3 3/16  -pt is eating more bananas and fruit  -maintain kdur at BID---recommend follow up as outpt        8. Urinary retention/BPH: Question plans for TURP. Continue Flomax.  - consider voiding trial soon---needs outpt urology follow up    -foley changed this past week  -wbc's 4.6 3/16    9. Anemia of chronic disease: On iron for supplement.  Hemoglobin stable at 10.8 3/16 10. Leukopenia: 4.0 11. Morbid obesity: Encourage appropriate diet and weight loss to help promote mobility and health. 12. Neurogenic bowel: working on re-establishing bowel program  -maintain AM program 13. Fever: Ecoli urosepsis  -dc rocephin and begin keflex 250mg  tid for  6 more days.  -catheter mtc as outpt/uro follow up LOS: 19 days A FACE TO FACE EVALUATION WAS PERFORMED  Ranelle Oyster 12/29/2018, 9:10 AM

## 2018-12-29 NOTE — Plan of Care (Signed)
  Problem: Consults Goal: RH SPINAL CORD INJURY PATIENT EDUCATION Description  See Patient Education module for education specifics.  Outcome: Progressing Goal: RH SPINAL CORD INJURY PATIENT EDUCATION Description  See Patient Education module for education specifics.  Outcome: Progressing   Problem: Consults Goal: RH GENERAL PATIENT EDUCATION Description See Patient Education module for education specifics. Outcome: Progressing Goal: Skin Care Protocol Initiated - if Braden Score 18 or less Description If consults are not indicated, leave blank or document N/A Outcome: Progressing   Problem: RH BOWEL ELIMINATION Goal: RH STG MANAGE BOWEL WITH ASSISTANCE Description STG Manage Bowel with mod Assistance.   Outcome: Progressing   Problem: RH SKIN INTEGRITY Goal: RH STG SKIN FREE OF INFECTION/BREAKDOWN Description With mod assist   Outcome: Progressing Goal: RH STG MAINTAIN SKIN INTEGRITY WITH ASSISTANCE Description STG Maintain Skin Integrity With mod Assistance.   Outcome: Progressing Goal: RH STG ABLE TO PERFORM INCISION/WOUND CARE W/ASSISTANCE Description STG Able To Perform Incision/Wound Care With mod Assistance.  Outcome: Progressing   Problem: RH SAFETY Goal: RH STG ADHERE TO SAFETY PRECAUTIONS W/ASSISTANCE/DEVICE Description STG Adhere to Safety Precautions With Assistance/Device. Min assist  Outcome: Progressing   Problem: RH PAIN MANAGEMENT Goal: RH STG PAIN MANAGED AT OR BELOW PT'S PAIN GOAL Description Pain scale <4/10  Outcome: Progressing   Problem: RH KNOWLEDGE DEFICIT SCI Goal: RH STG INCREASE KNOWLEDGE OF SELF CARE AFTER SCI Outcome: Progressing   

## 2018-12-29 NOTE — Progress Notes (Signed)
Social Work Discharge Note  The overall goal for the admission was met for:   Discharge location: Yes - home with wife  Length of Stay: Yes - 19 days  Discharge activity level: Yes - w/c level minimal assistance  Home/community participation: Yes  Services provided included: MD, RD, PT, OT, RN, Pharmacy, Neuropsych and SW  Financial Services: Private Insurance: [employer]  Follow-up services arranged: Home Health: PT/OT, DME: pt has all recommended DME already at home and Patient/Family request agency HH: [facility], DME: none  Comments (or additional information):  Pt has done well on CIR and feels prepared to go home.  Pt's wife has attended family. Maximos Zayas - wife - [phone]; [phone]  Patient/Family verbalized understanding of follow-up arrangements: Yes  Individual responsible for coordination of the follow-up plan: Pt and his wife  Confirmed correct DME delivered: Trey Sailors 12/29/2018    Tasheema Perrone, Silvestre Mesi

## 2018-12-29 NOTE — Discharge Instructions (Signed)
Inpatient Rehab Discharge Instructions  [name] [name] Discharge date and time:  12/29/18  Activities/Precautions/ Functional Status: Activity: no lifting, driving, or strenuous exercise till cleared by MD Diet: regular diet Wound Care: keep wound clean and dry    Functional status:  ___ No restrictions     ___ Walk up steps independently _X__ 24/7 supervision/assistance   ___ Walk up steps with assistance ___ Intermittent supervision/assistance  ___ Bathe/dress independently ___ Walk with walker     _X__ Bathe/dress with assistance ___ Walk Independently    ___ Shower independently ___ Walk with assistance    ___ Shower with assistance _X__ No alcohol     ___ Return to work/school ________   COMMUNITY REFERRALS UPON DISCHARGE:   Home Health:   PT     OT     RN  Agency:  [facility]  Phone:  [phone]  Medical Equipment/Items Ordered:  You have all recommended DME at home already.   Special Instructions: 1. To prevent/decrease bowel incontinence- Need to maintain a bowel program--suppository in morning after breakfast. Also get on bedside commode 30-45 minutes after eating to help with evacuation and this will prevent accidents during the day.  2. Drink plenty of water to flush kidneys and prevent UTI. 3. Need to do foley care twice a day.    Indwelling Urinary Catheter Care, Adult An indwelling urinary catheter is a thin tube that is put into your bladder. The tube helps to drain pee (urine) out of your body. The tube goes in through your urethra. Your urethra is where pee comes out of your body. Your pee will come out through the catheter, then it will go into a bag (drainage bag). Take good care of your catheter so it will work well. How to wear your catheter and bag Supplies needed  Sticky tape (adhesive tape) or a leg strap.  Alcohol wipe or soap and water (if you use tape).  A clean towel (if you use tape).  Large overnight bag.  Smaller bag (leg bag). Wearing  your catheter Attach your catheter to your leg with tape or a leg strap.  Make sure the catheter is not pulled tight.  If a leg strap gets wet, take it off and put on a dry strap.  If you use tape to hold the bag on your leg: 1. Use an alcohol wipe or soap and water to wash your skin where the tape made it sticky before. 2. Use a clean towel to pat-dry that skin. 3. Use new tape to make the bag stay on your leg. Wearing your bags You should have been given a large overnight bag.  You may wear the overnight bag in the day or night.  Always have the overnight bag lower than your bladder.  Do not let the bag touch the floor.  Before you go to sleep, put a clean plastic bag in a wastebasket. Then hang the overnight bag inside the wastebasket. You should also have a smaller leg bag that fits under your clothes.  Always wear the leg bag below your knee.  Do not wear your leg bag at night. How to care for your skin and catheter Supplies needed  A clean washcloth.  Water and mild soap.  A clean towel. Caring for your skin and catheter      Clean the skin around your catheter every day: ? Wash your hands with soap and water. ? Wet a clean washcloth in warm water and mild soap. ?  Clean the skin around your urethra. ? If you are male: ? Gently spread the folds of skin around your vagina (labia). ? With the washcloth in your other hand, wipe the inner side of your labia on each side. Wipe from front to back. ? If you are male: ? Pull back any skin that covers the end of your penis (foreskin). ? With the washcloth in your other hand, wipe your penis in small circles. Start wiping at the tip of your penis, then move away from the catheter. ? With your free hand, hold the catheter close to where it goes into your body. ? Keep holding the catheter during cleaning so it does not get pulled out. ? With the washcloth in your other hand, clean the catheter. ? Only wipe downward on the  catheter. ? Do not wipe upward toward your body. Doing this may push germs into your urethra and cause infection. ? Use a clean towel to pat-dry the catheter and the skin around it. Make sure to wipe off all soap. ? Wash your hands with soap and water.  Shower every day. Do not take baths.  Do not use cream, ointment, or lotion on the area where the catheter goes into your body, unless your doctor tells you to.  Do not use powders, sprays, or lotions on your genital area.  Check your skin around the catheter every day for signs of infection. Check for: ? Redness, swelling, or pain. ? Fluid or blood. ? Warmth. ? Pus or a bad smell. How to empty the bag Supplies needed  Rubbing alcohol.  Gauze pad or cotton ball.  Tape or a leg strap. Emptying the bag Pour the pee out of your bag when it is ?- full, or at least 2-3 times a day. Do this for your overnight bag and your leg bag. 1. Wash your hands with soap and water. 2. Separate (detach) the bag from your leg. 3. Hold the bag over the toilet or a clean pail. Keep the bag lower than your hips and bladder. This is so the pee (urine) does not go back into the tube. 4. Open the pour spout. It is at the bottom of the bag. 5. Empty the pee into the toilet or pail. Do not let the pour spout touch any surface. 6. Put rubbing alcohol on a gauze pad or cotton ball. 7. Use the gauze pad or cotton ball to clean the pour spout. 8. Close the pour spout. 9. Attach the bag to your leg with tape or a leg strap. 10. Wash your hands with soap and water. Follow instructions for cleaning the drainage bag:  From the product maker.  As told by your doctor. How to change the bag Supplies needed  Alcohol wipes.  A clean bag.  Tape or a leg strap. Changing the bag Replace your bag with a clean bag once a month. If it starts to leak, smell bad, or look dirty, change it sooner. 1. Wash your hands with soap and water. 2. Separate the dirty bag  from your leg. 3. Pinch the catheter with your fingers so that pee does not spill out. 4. Separate the catheter tube from the bag tube where these tubes connect (at the connection valve). Do not let the tubes touch any surface. 5. Clean the end of the catheter tube with an alcohol wipe. Use a different alcohol wipe to clean the end of the bag tube. 6. Connect the catheter tube to the  tube of the clean bag. 7. Attach the clean bag to your leg with tape or a leg strap. Do not make the bag tight on your leg. 8. Wash your hands with soap and water. General rules   Never pull on your catheter. Never try to take it out. Doing that can hurt you.  Always wash your hands before and after you touch your catheter or bag. Use a mild, fragrance-free soap. If you do not have soap and water, use hand sanitizer.  Always make sure there are no twists or bends (kinks) in the catheter tube.  Always make sure there are no leaks in the catheter or bag.  Drink enough fluid to keep your pee pale yellow.  Do not take baths, swim, or use a hot tub.  If you are male, wipe from front to back after you poop (have a bowel movement). Contact a doctor if:  Your pee is cloudy.  Your pee smells worse than usual.  Your catheter gets clogged.  Your catheter leaks.  Your bladder feels full. Get help right away if:  You have redness, swelling, or pain where the catheter goes into your body.  You have fluid, blood, pus, or a bad smell coming from the area where the catheter goes into your body.  Your skin feels warm where the catheter goes into your body.  You have a fever.  You have pain in your: ? Belly (abdomen). ? Legs. ? Lower back. ? Bladder.  You see blood in the catheter.  Your pee is pink or red.  You feel sick to your stomach (nauseous).  You throw up (vomit).  You have chills.  Your pee is not draining into the bag.  Your catheter gets pulled out. Summary  An indwelling  urinary catheter is a thin tube that is placed into the bladder to help drain pee (urine) out of the body.  The catheter is placed into the part of the body that drains pee from the bladder (urethra).  Taking good care of your catheter will keep it working properly and help prevent problems.  Always wash your hands before and after touching your catheter or bag.  Never pull on your catheter or try to take it out. This information is not intended to replace advice given to you by your health care provider. Make sure you discuss any questions you have with your health care provider. Document Released: 01/25/2013 Document Revised: 03/23/2018 Document Reviewed: 05/16/2017 Elsevier Interactive Patient Education  2019 [employer].    My questions have been answered and I understand these instructions. I will adhere to these goals and the provided educational materials after my discharge from the hospital.  Patient/Caregiver Signature _______________________________ Date __________  Clinician Signature _______________________________________ Date __________  Please bring this form and your medication list with you to all your follow-up doctor's appointments.

## 2018-12-29 NOTE — Discharge Summary (Signed)
Physician Discharge Summary  Patient ID: [name] [name] MRN: [id number] DOB/AGE: [birth year] 63 y.o.  Admit date: 12/10/2018 Discharge date: 12/29/2018  Discharge Diagnoses:  Principal Problem:   Radiculopathy Active Problems:   Morbid (severe) obesity due to excess calories (HCC)   Anemia of chronic disease   Acute on chronic anemia   E-coli UTI   Discharged Condition:  Stable   Significant Diagnostic Studies: Dg Cervical Spine 2-3 Views  Result Date: 12/07/2018 CLINICAL DATA:  Surgical anterior fusion from C3-C6. EXAM: DG C-ARM 61-120 MIN; CERVICAL SPINE - 2-3 VIEW FLUOROSCOPY TIME:  6 seconds. COMPARISON:  Radiographs of June 28, 2018. FINDINGS: Two intraoperative fluoroscopic images demonstrate the patient be status post surgical anterior fusion of C3-4, C4-5 and C5-6. Good alignment of the vertebral bodies is noted. IMPRESSION: Status post surgical fusion at multiple levels as described above. Electronically Signed   By: Lupita RaiderJames  Green Jr, M.D.   On: 12/07/2018 15:40   Dg Chest Port 1 View  Result Date: 12/25/2018 CLINICAL DATA:  Fever EXAM: PORTABLE CHEST 1 VIEW COMPARISON:  June 28, 2018 FINDINGS: The lungs are clear. Heart is slightly enlarged with pulmonary vascularity normal. No adenopathy. There is aortic atherosclerosis. There is postoperative change in the lower cervical region. IMPRESSION: Mild cardiac enlargement. No edema or consolidation. Aortic Atherosclerosis (ICD10-I70.0). Electronically Signed   By: Bretta BangWilliam  Woodruff III M.D.   On: 12/25/2018 21:35   Dg C-arm 1-60 Min  Result Date: 12/07/2018 CLINICAL DATA:  Surgical anterior fusion from C3-C6. EXAM: DG C-ARM 61-120 MIN; CERVICAL SPINE - 2-3 VIEW FLUOROSCOPY TIME:  6 seconds. COMPARISON:  Radiographs of June 28, 2018. FINDINGS: Two intraoperative fluoroscopic images demonstrate the patient be status post surgical anterior fusion of C3-4, C4-5 and C5-6. Good alignment of the vertebral bodies is noted.  IMPRESSION: Status post surgical fusion at multiple levels as described above. Electronically Signed   By: Lupita RaiderJames  Green Jr, M.D.   On: 12/07/2018 15:40    Labs:  Basic Metabolic Panel: Recent Labs  Lab 12/25/18 0559 12/25/18 2135 12/28/18 0635  NA 138 137 136  K 3.5 3.4* 3.3*  CL 106 110 103  CO2 27 23 25   GLUCOSE 97 110* 91  BUN 12 14 6*  CREATININE 0.82 0.88 0.64  CALCIUM 8.7* 8.3* 8.7*    CBC: Recent Labs  Lab 12/25/18 0559 12/25/18 2135 12/28/18 0635  WBC 4.0 4.1 4.6  HGB 9.5* 9.3* 10.8*  HCT 30.7* 30.4* 34.4*  MCV 78.3* 77.4* 77.5*  PLT 248 234 233    CBG: No results for input(s): GLUCAP in the last 168 hours.  Brief HPI:   [name] [name] is a 63 year old male with history of CKD, HTN, morbid obesity-BMI 43, BPH-history retention, lumbar decompression complicated by postop infection and septic knee 07/2018, neck pain with progressive BUE/BLE weakness due to severe cervical stenosis C3-C6.  As back infection resolved he was cleared to undergo ACDF C3-C6 by Dr. Dutch QuintPoole on 12/07/2018.  Patient with significant debility with myelopathy and has had ongoing therapy at home with attempts at sliding board transfers.  He continues to have deficits in mobility and ADLs.  CIR was recommended for follow-up therapy.   Hospital Course: [name] [name] was admitted to rehab 12/10/2018 for inpatient therapies to consist of PT and OT at least three hours five days a week. Past admission physiatrist, therapy team and rehab RN have worked together to provide customized collaborative inpatient rehab.  Blood pressures have been monitored on twice daily basis and have been  stable.  P.o. intake has been good.  He continues to be incontinent of bowel.  Bowel program has been attempted as well as scheduled toileting after meals however patient has declined to participate in this.  His neck incision is C/D/I and healing well without signs or symptoms of infection.  Follow-up labs revealed hypokalemia and  potassium supplements were added however patient has continued to refuse this every other day.  Rationale and importance of supplement has been discussed with patient multiple times however he remains noncompliant.   Follow-up CBC shows acute blood loss anemia is resolving.  He has been instructed on appropriate diet to help with additional supplementation.  Foley remains in place and was changed out prior to discharge.  He did develop fevers and chills due to urosepsis from E. coli UTI on 3/13.  He was started on Rocephin and this was changed over to Keflex for additional 6 days to complete 10-day course of antibiotics.  He has been educated on Foley care and has been set up with urology for follow-up next week.  Patient has progressed to min assist at wheelchair level.  He will continue to receive further follow-up home health PT and OT by [facility] home health after discharge.   Rehab course: During patient's stay in rehab weekly team conferences were held to monitor patient's progress, set goals and discuss barriers to discharge. At admission, patient required max assist +2 for basic self-care tasks.  He required max assist for bed mobility and for sit to stand transfers with use of left equipment. He  has had improvement in activity tolerance, balance, postural control as well as ability to compensate for deficits.  He requires mod assist for ADL tasks. He has progressed to requiring min assist for bed mobility and sliding board transfers.  He is modified independent for navigating wheelchair with assistance to manage future parts.  He requires max assist for sit to stand transfers with steady.  Family education was completed regarding all aspects of care and mobility.  Wife to continue performing ADLs at bed level.    Disposition: Home  Diet: Regular  Special Instructions: 1. Perform foley care twice a day. Keep foley bag off the floor. 2. Will need BMET rechecked in 1-2 weeks.    Discharge  Instructions    Ambulatory referral to Physical Medicine Rehab   Complete by:  As directed    1-2 weeks transitional care appt     Allergies as of 12/29/2018      Reactions   Unasyn [ampicillin-sulbactam Sodium] Hives, Itching, Swelling, Rash      Medication List    STOP taking these medications   celecoxib 200 MG capsule Commonly known as:  CeleBREX     TAKE these medications   acetaminophen 325 MG tablet Commonly known as:  TYLENOL Take 1-2 tablets (325-650 mg total) by mouth every 4 (four) hours as needed for mild pain.   bisacodyl 10 MG suppository Commonly known as:  DULCOLAX Place 1 suppository (10 mg total) rectally daily at 6 (six) AM.   cephALEXin 250 MG capsule Commonly known as:  KEFLEX Take 1 capsule (250 mg total) by mouth every 8 (eight) hours.   diclofenac sodium 1 % Gel Commonly known as:  Voltaren Apply 4 g topically 4 (four) times daily. What changed:  when to take this   DULoxetine 60 MG capsule Commonly known as:  CYMBALTA Take 1 capsule (60 mg total) by mouth daily.   ferrous gluconate 324 MG tablet  Commonly known as:  FERGON Take 1 tablet (324 mg total) by mouth 3 (three) times daily with meals.   HYDROcodone-acetaminophen 7.5-325 MG tablet--Rx # 28 pills Commonly known as:  NORCO Take 1 tablet by mouth every 8 (eight) hours as needed for severe pain. What changed:  reasons to take this   methocarbamol 500 MG tablet Commonly known as:  ROBAXIN Take 1 tablet (500 mg total) by mouth every 6 (six) hours as needed for muscle spasms.   pantoprazole 40 MG tablet Commonly known as:  PROTONIX Take 1 tablet (40 mg total) by mouth 2 (two) times daily before a meal. What changed:  when to take this Notes to patient:  For reflux/indigestion--can decrease to once a day then as needed if symptoms improve.    potassium chloride SA 20 MEQ tablet Commonly known as:  K-DUR,KLOR-CON Take 1 tablet (20 mEq total) by mouth 2 (two) times daily. Notes to  patient:  YOU HAVE TO TAKE THIS TWICE A DAY--EVERY DAY. LOW LEVELS OF THIS CAN AFFECT YOUR HEART.    senna-docusate 8.6-50 MG tablet Commonly known as:  Senokot-S Take 2 tablets by mouth at bedtime. Notes to patient:  For constipation   tamsulosin 0.4 MG Caps capsule Commonly known as:  FLOMAX Take 1 capsule (0.4 mg total) by mouth daily after supper. What changed:  when to take this Notes to patient:  For bladder      Follow-up Information    Ranelle Oyster, MD Follow up.   Specialty:  Physical Medicine and Rehabilitation Why:  office will call you with follow up appointment Contact information: [address] [place] [address] [phone]        Julio Sicks, MD Follow up.   Specialty:  Neurosurgery Contact information: [address] [place] [address] [phone]        Renford Dills, MD. Nyra Capes on 01/11/2019.   Specialty:  Internal Medicine Why:  @ 2:30PM. for post hospital follow up--needs BMET for follow up on potassium.  Contact information: [address] [place] [address] [phone]        Astrid Divine, PA Follow up on 01/06/2019.   Specialty:  Urology Why:  Be there at 9 am for follow up appointment Contact information: [address] [place] [address] [phone]           Signed: Jacquelynn Cree 12/31/2018, 1:23 PM

## 2018-12-29 NOTE — Progress Notes (Signed)
Pt discharge home with family. Belongings sent with pt. Pt stable at time of discharge. Discharged instructions given by Elita Quick, PA. No further questions from family or pt.   [name] [name] W Cameren Earnest

## 2018-12-30 LAB — CULTURE, BLOOD (ROUTINE X 2)
Culture: NO GROWTH
Culture: NO GROWTH
Special Requests: ADEQUATE
Special Requests: ADEQUATE

## 2018-12-31 ENCOUNTER — Telehealth: Payer: Self-pay

## 2018-12-31 NOTE — Telephone Encounter (Signed)
Called patient for a Transitional Care Call and no answer on both phones. Left a detailed message on [phone] about appt and to call back.

## 2019-01-05 ENCOUNTER — Encounter: Payer: No Typology Code available for payment source | Admitting: Physical Medicine & Rehabilitation

## 2019-01-29 IMAGING — MR MR CERVICAL SPINE W/O CM
4 of 5 series · 18 of 48 positions shown · non-contrast
Comparison: MRI cervical spine 06/29/2018.

CLINICAL DATA: Bowel and bladder incontinence. Myelopathy. Chronic
neck pain and left upper arm weakness.

EXAM:
MRI CERVICAL SPINE WITHOUT CONTRAST
TECHNIQUE: Multiplanar, multisequence MR imaging of the cervical spine was
performed. No intravenous contrast was administered.

[Series 3: T2 · sagittal · 3.0mm · 0.43mm/px · 6 of 18 slices shown (1 of 2)]
[im 1/18]
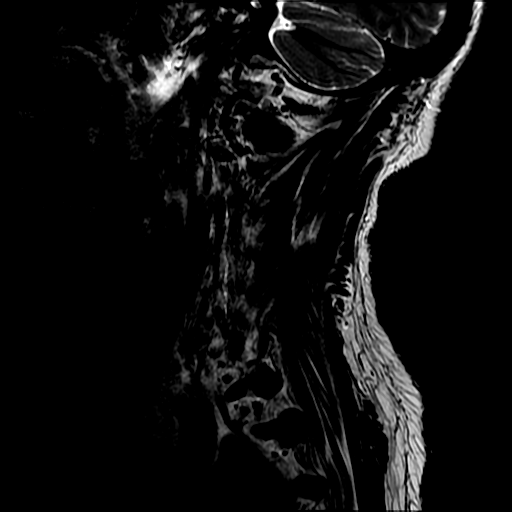
[im 4/18]
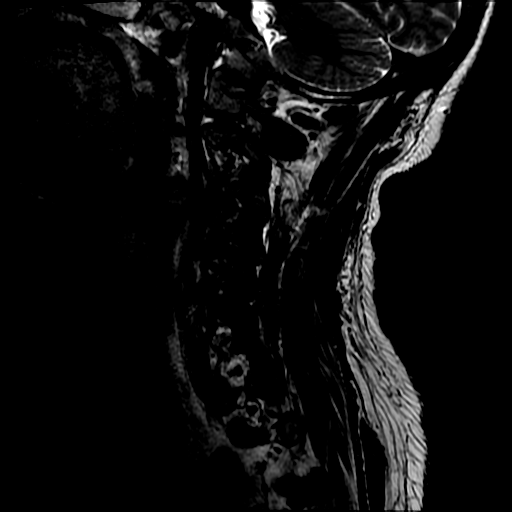
[im 7/18]
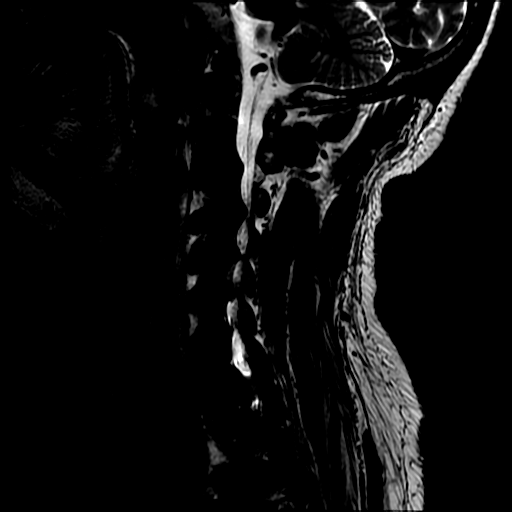
[im 11/18]
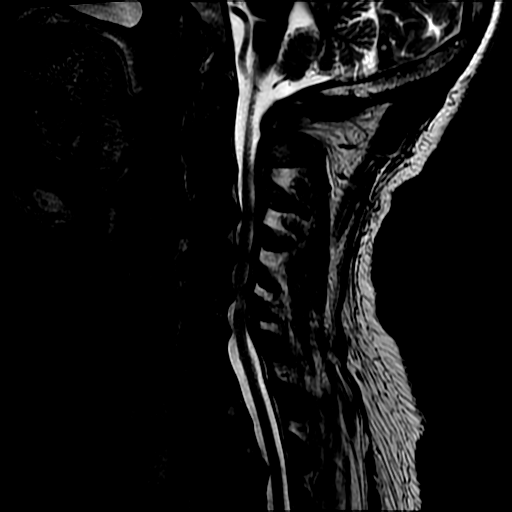
[im 14/18]
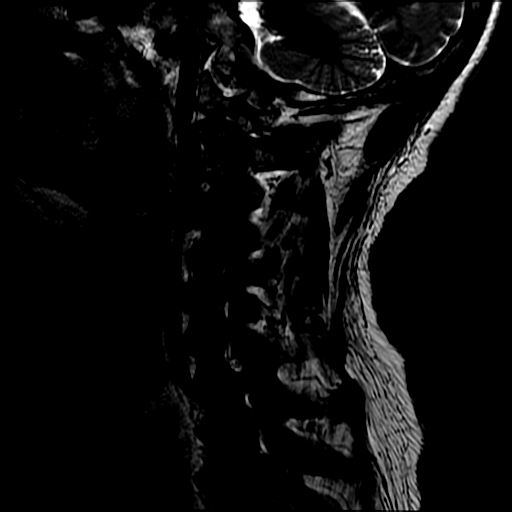
[im 18/18]
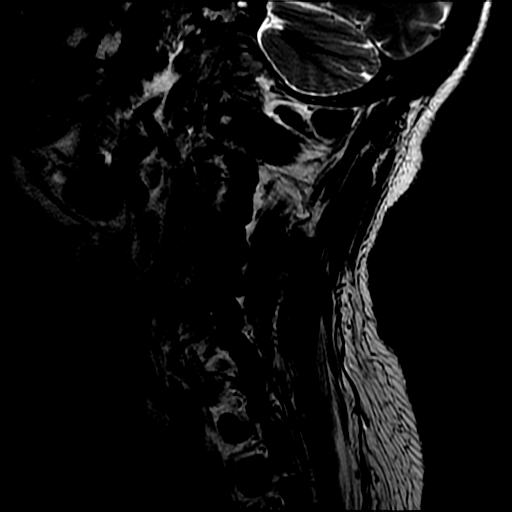

[Series 4: STIR · sagittal · 3.0mm · 0.43mm/px · 3 of 18 slices shown]
[im 3/18]
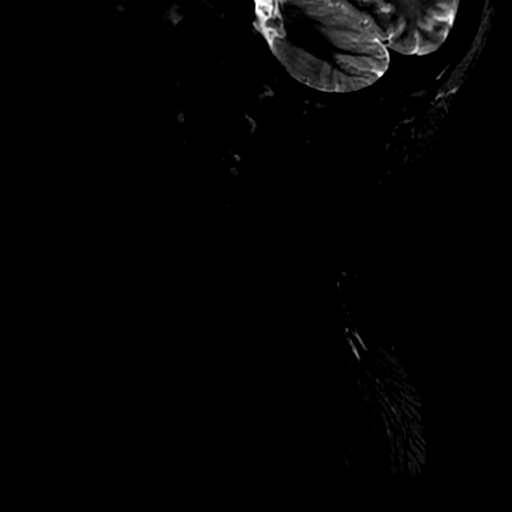
[im 9/18]
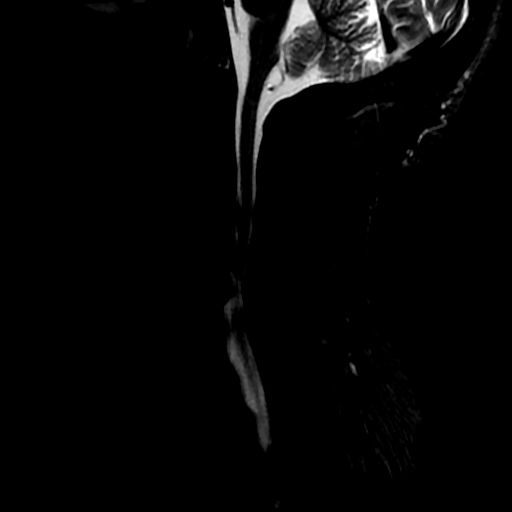
[im 15/18]
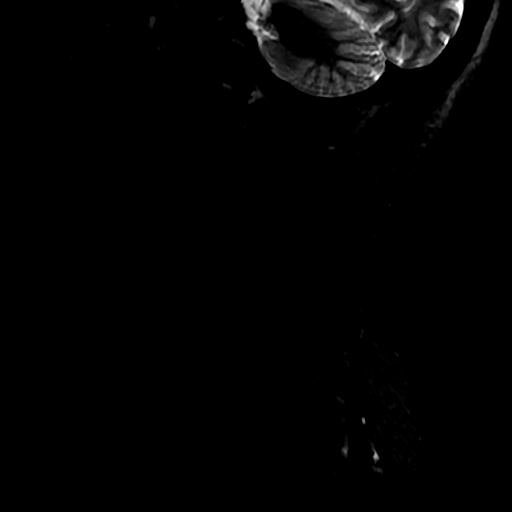

[Series 5: FLAIR · sagittal · 3.0mm · 0.43mm/px · 3 of 18 slices shown]
[im 3/18]
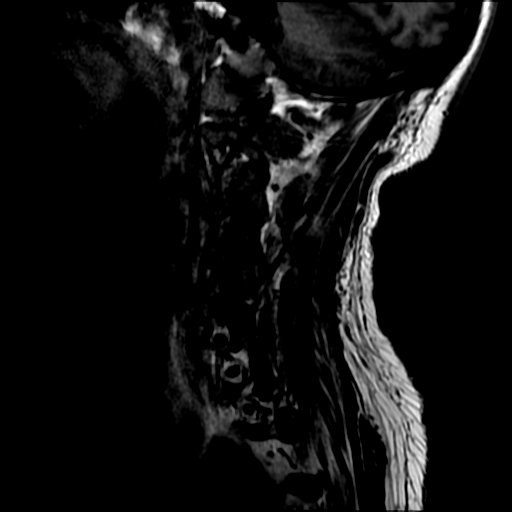
[im 9/18]
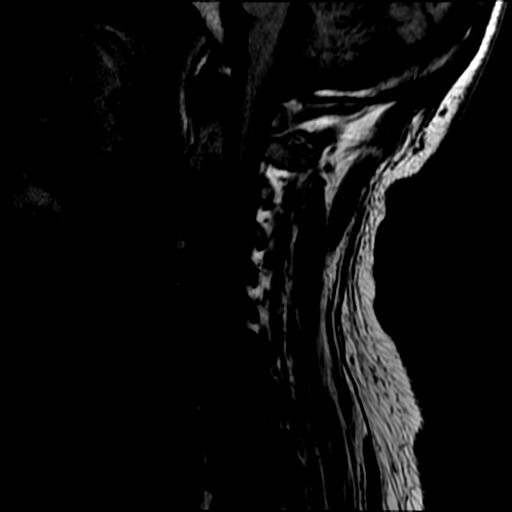
[im 15/18]
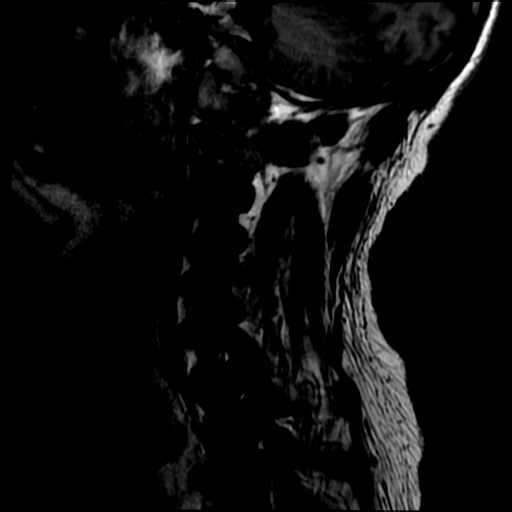

[Series 7: T2 · axial · 3.0mm · 0.35mm/px · z∈[-11,+72]mm · 6 of 33 slices shown (2 of 2)]
[im 1/33]
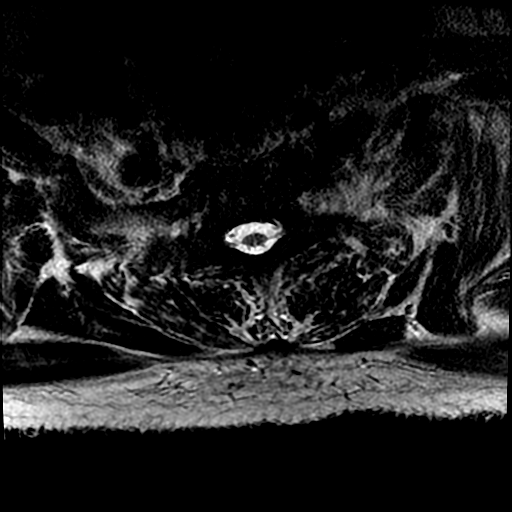
[im 6/33]
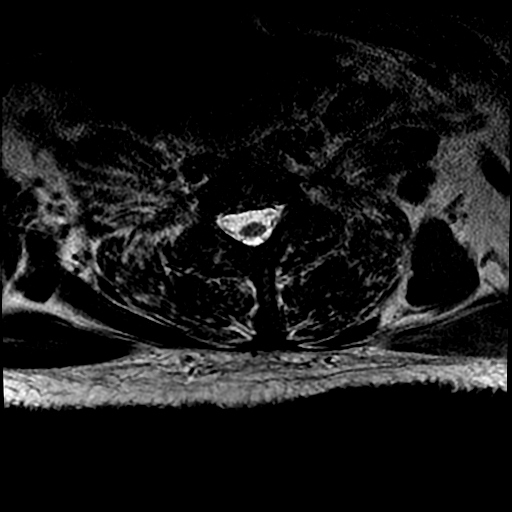
[im 11/33]
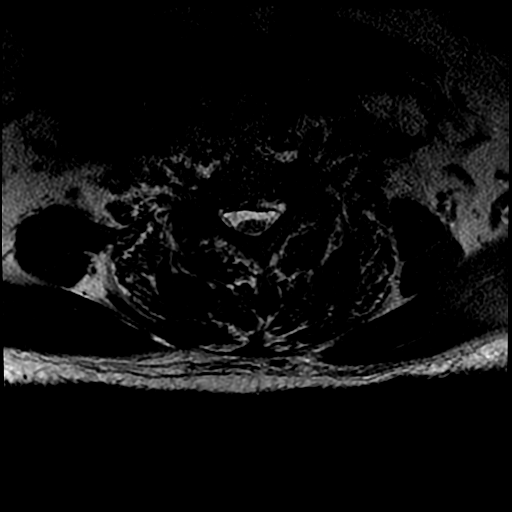
[im 14/33]
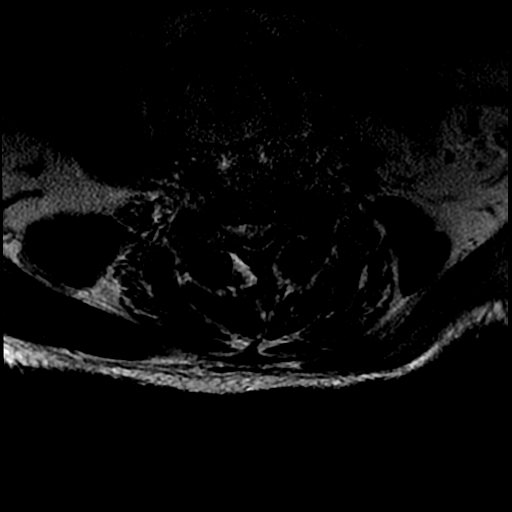
[im 17/33]
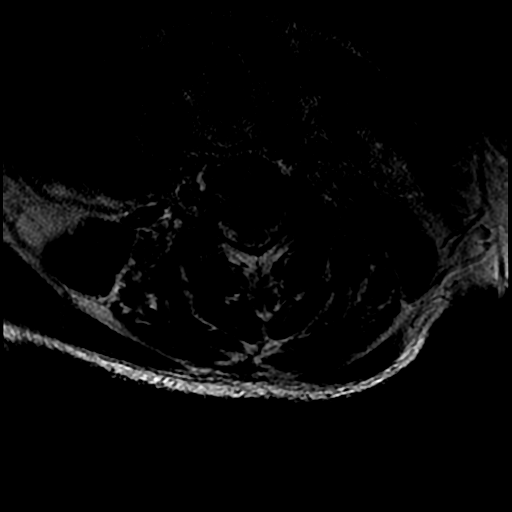
[im 27/33]
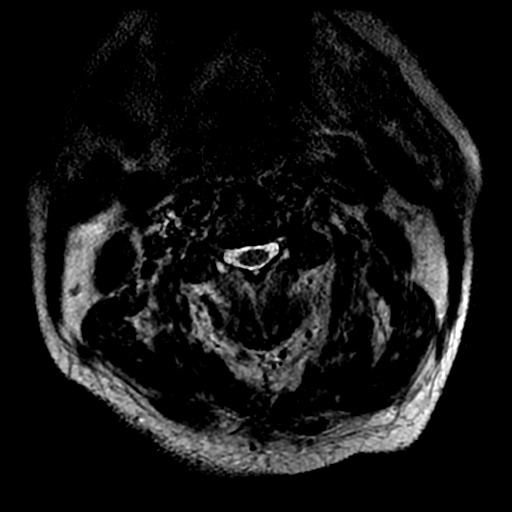

[18 of 48 positions shown; findings below may reference images not displayed]

FINDINGS: Alignment: There is mild reversal of the normal cervical lordosis.
Trace retrolisthesis C3 on C4 and C4 on C5 is noted. Alignment is
unchanged.

Vertebrae: No fracture or worrisome lesion.

Cord: Signal abnormality in the right hemicord posterior just
inferior to C5-6 is unchanged. Possible signal abnormality within
the cord at C4-5 on the prior MRI is not seen today.

Posterior Fossa, vertebral arteries, paraspinal tissues: Negative.

Disc levels:

C2-3: Posterior bony ridging and left worse than right uncovertebral
disease. The central canal is open. Moderate to moderately severe
foraminal stenosis is worse on the left.

C3-4: Disc osteophyte complex, uncovertebral disease and ligamentum
flavum thickening are seen. CSF about the cord is effaced and the is
deformed. Moderately severe to severe foraminal narrowing is worse
on the left. Spondylosis appears slightly worse than on the prior
MRI.

C4-5: Diffuse broad-based disc bulge and uncovertebral disease cause
flattening of the cord and severe bilateral foraminal narrowing. The
appearance is somewhat worse than on the prior MRI.

C5-6: Diffuse disc osteophyte complex and left worse than right
uncovertebral disease. The cord is flattened. Severe bilateral
foraminal narrowing is present. The appearance is not markedly
changed.

C6-7: Disc bulge and left worse than right uncovertebral disease.
There is mild to moderate central canal stenosis. Moderately severe
to severe foraminal narrowing is worse on the left. The appearance
is unchanged.

C7-T1: Left worse than right facet degenerative disease. Otherwise
negative.
IMPRESSION: Reversal the normal cervical lordosis contributes to central canal
narrowing from C3-4 to C5-6.

No change in myelomalacia within the right hemicord at and just
inferior to the C5-6 level. Possible signal abnormality within the
cord at C4-5 on the prior MRI is not seen today.

Some progression of spondylosis at C3-4 where there is deformity of
the cord and moderately severe to severe foraminal narrowing, worse
on the left.

Some progression spondylosis at C4-5 where the cord is flattened and
there is severe bilateral foraminal narrowing.

No change in severe central canal and bilateral foraminal narrowing
at C5-6.

No change in mild to moderate central canal stenosis and moderately
severe to severe foraminal narrowing at C6-7. Foraminal narrowing is
worse on the left.

## 2019-02-11 ENCOUNTER — Other Ambulatory Visit: Payer: Self-pay | Admitting: Physical Medicine and Rehabilitation

## 2019-02-11 DIAGNOSIS — F339 Major depressive disorder, recurrent, unspecified: Secondary | ICD-10-CM

## 2019-05-12 ENCOUNTER — Other Ambulatory Visit (INDEPENDENT_AMBULATORY_CARE_PROVIDER_SITE_OTHER): Payer: Self-pay | Admitting: Specialist

## 2019-05-12 NOTE — Telephone Encounter (Signed)
Please advise 

## 2019-06-11 ENCOUNTER — Other Ambulatory Visit: Payer: Self-pay | Admitting: Specialist

## 2019-06-11 DIAGNOSIS — G894 Chronic pain syndrome: Secondary | ICD-10-CM

## 2019-06-11 MED ORDER — HYDROCODONE-ACETAMINOPHEN 7.5-325 MG PO TABS: 1.0000 | ORAL_TABLET | Freq: Three times a day (TID) | ORAL | 0 refills | Status: DC | PRN

## 2019-06-11 NOTE — Telephone Encounter (Signed)
Pt called in requesting a refill on hydrocodone, please have that sent to [facility] on [address].  [phone]

## 2019-06-11 NOTE — Telephone Encounter (Signed)
Sent request to Dr. Nitka 

## 2019-07-26 ENCOUNTER — Other Ambulatory Visit: Payer: Self-pay | Admitting: Specialist

## 2019-07-26 DIAGNOSIS — G894 Chronic pain syndrome: Secondary | ICD-10-CM

## 2019-07-26 MED ORDER — HYDROCODONE-ACETAMINOPHEN 5-325 MG PO TABS: 1.0000 | ORAL_TABLET | Freq: Three times a day (TID) | ORAL | 0 refills | Status: AC | PRN

## 2019-07-26 NOTE — Addendum Note (Signed)
Addended by: Minda Ditto, Alyse Low N on: 07/26/2019 12:21 PM   Modules accepted: Orders

## 2019-07-26 NOTE — Telephone Encounter (Signed)
Patient called. Would like hydrocodone called in to [facility] on [address]. His call back number is [phone]

## 2019-08-04 ENCOUNTER — Encounter (HOSPITAL_COMMUNITY): Payer: Self-pay | Admitting: Emergency Medicine

## 2019-08-04 ENCOUNTER — Other Ambulatory Visit: Payer: Self-pay

## 2019-08-04 ENCOUNTER — Other Ambulatory Visit: Payer: Self-pay | Admitting: Physical Medicine and Rehabilitation

## 2019-08-04 ENCOUNTER — Emergency Department (HOSPITAL_COMMUNITY)
Admission: EM | Admit: 2019-08-04 | Discharge: 2019-08-04 | Disposition: A | Discharge status: Home / Self Care | Payer: No Typology Code available for payment source | Attending: Emergency Medicine | Admitting: Emergency Medicine

## 2019-08-04 DIAGNOSIS — T83091A Other mechanical complication of indwelling urethral catheter, initial encounter: Secondary | ICD-10-CM | POA: Insufficient documentation

## 2019-08-04 DIAGNOSIS — Z79899 Other long term (current) drug therapy: Secondary | ICD-10-CM | POA: Diagnosis not present

## 2019-08-04 DIAGNOSIS — Z978 Presence of other specified devices: Secondary | ICD-10-CM

## 2019-08-04 DIAGNOSIS — I10 Essential (primary) hypertension: Secondary | ICD-10-CM | POA: Insufficient documentation

## 2019-08-04 DIAGNOSIS — Z87891 Personal history of nicotine dependence: Secondary | ICD-10-CM | POA: Diagnosis not present

## 2019-08-04 DIAGNOSIS — R399 Unspecified symptoms and signs involving the genitourinary system: Secondary | ICD-10-CM | POA: Insufficient documentation

## 2019-08-04 DIAGNOSIS — Z791 Long term (current) use of non-steroidal anti-inflammatories (NSAID): Secondary | ICD-10-CM | POA: Insufficient documentation

## 2019-08-04 DIAGNOSIS — K219 Gastro-esophageal reflux disease without esophagitis: Secondary | ICD-10-CM

## 2019-08-04 DIAGNOSIS — Y731 Therapeutic (nonsurgical) and rehabilitative gastroenterology and urology devices associated with adverse incidents: Secondary | ICD-10-CM | POA: Diagnosis not present

## 2019-08-04 LAB — BASIC METABOLIC PANEL
Anion gap: 9 (ref 5–15)
BUN: 16 mg/dL (ref 8–23)
CO2: 23 mmol/L (ref 22–32)
Calcium: 8.6 mg/dL — ABNORMAL LOW (ref 8.9–10.3)
Chloride: 107 mmol/L (ref 98–111)
Creatinine, Ser: 0.78 mg/dL (ref 0.61–1.24)
GFR calc Af Amer: >60 mL/min (ref >60)
GFR calc non Af Amer: >60 mL/min (ref >60)
Glucose, Bld: 94 mg/dL (ref 70–99)
Potassium: 3.6 mmol/L (ref 3.5–5.1)
Sodium: 139 mmol/L (ref 135–145)

## 2019-08-04 LAB — CBC WITH DIFFERENTIAL/PLATELET
Abs Immature Granulocytes: 0.02 10*3/uL (ref 0.00–0.07)
Basophils Absolute: 0 10*3/uL (ref 0.0–0.1)
Basophils Relative: 1 %
Eosinophils Absolute: 0.3 10*3/uL (ref 0.0–0.5)
Eosinophils Relative: 5 %
HCT: 34.6 % — ABNORMAL LOW (ref 39.0–52.0)
Hemoglobin: 10.7 g/dL — ABNORMAL LOW (ref 13.0–17.0)
Immature Granulocytes: 0 %
Lymphocytes Relative: 15 %
Lymphs Abs: 1 10*3/uL (ref 0.7–4.0)
MCH: 25.7 pg — ABNORMAL LOW (ref 26.0–34.0)
MCHC: 30.9 g/dL (ref 30.0–36.0)
MCV: 83 fL (ref 80.0–100.0)
Monocytes Absolute: 0.7 10*3/uL (ref 0.1–1.0)
Monocytes Relative: 11 %
Neutro Abs: 4.5 10*3/uL (ref 1.7–7.7)
Neutrophils Relative %: 68 %
Platelets: 248 10*3/uL (ref 150–400)
RBC: 4.17 MIL/uL — ABNORMAL LOW (ref 4.22–5.81)
RDW: 16.8 % — ABNORMAL HIGH (ref 11.5–15.5)
WBC: 6.6 10*3/uL (ref 4.0–10.5)
nRBC: 0 % (ref 0.0–0.2)

## 2019-08-04 MED ORDER — INFLUENZA VAC SPLIT QUAD 0.5 ML IM SUSY: 0.5000 mL | PREFILLED_SYRINGE | INTRAMUSCULAR | Status: DC

## 2019-08-04 NOTE — ED Notes (Signed)
pts wife in route with wheelchair. Transportation service has been contacted. Discharge instructions discussed with patient. There are no further questions.

## 2019-08-04 NOTE — ED Notes (Signed)
Pt verbalized understanding of discharge instructions and denies any further questions at this time.   

## 2019-08-04 NOTE — ED Notes (Signed)
[facility] to transport pt home around 5pm.

## 2019-08-04 NOTE — ED Notes (Signed)
This RN updated wife on the phone per his request.

## 2019-08-04 NOTE — ED Notes (Signed)
Bladder scan volume = 0ml.

## 2019-08-04 NOTE — Discharge Instructions (Addendum)
Get help right away if: You have redness, swelling, or pain where the catheter enters your body. You have fluid, blood, pus, or a bad smell coming from the area where the catheter enters your body. The area where the catheter enters your body feels warm to the touch. You have a fever. You have pain in your abdomen, legs, lower back, or bladder. You see blood in the catheter. Your urine is pink or red. You have nausea, vomiting, or chills. Your urine is not draining into the bag. Your catheter gets pulled out. 

## 2019-08-04 NOTE — ED Provider Notes (Signed)
[facility] EMERGENCY DEPARTMENT Provider Note   CSN: [id number] Arrival date & time: 08/04/19  1141     History   Chief Complaint No chief complaint on file.   HPI [name] [name] is a 63 y.o. male who presents emergency department with chief complaint of bleeding from urinary catheter.  He has a past medical history of severe spinal stenosis with history of epidural abscess and cervical myelopathy.  The patient is wheelchair-bound with very limited sensation to the groin, and inability to walk.  He has a chronic indwelling Foley catheter.  Patient states that he has a home health nurse that comes out to change his catheter once a month.  She came out last night and change the catheter.  He states that when she change the catheter she noticed the clot come through the catheter followed by clots of urine and some bleeding around the urethra.  Patient states that in the middle of the night his wife took a look and saw that he continued to bleed around the catheter site.  This morning she checked it again and noticed that he was extremely edematous in the penis and testicles and had continued bleeding.  The patient spoke with Dr. Jeffie Pollock who referred him here to the emergency department for further evaluation.  He continues to have good clear urinary output from his catheter.  He denies pain.     HPI  Past Medical History:  Diagnosis Date  . Anemia   . Arthritis   . Chronic kidney disease    HX acute kidney failure / acute pyelonephritis / hydronephrosis / severe sepsis per discharge summary 12/27/13  . GERD (gastroesophageal reflux disease)   . History of kidney stones   . Hypertension   . Morbid obesity ([place])   . Obstructive sleep apnea    does not need c pap since 110 lb wt loss  . Osteoporosis   . Prurigo 2002  . Scars    ON ARMS FROM CHEMICAL EXPLOSION 1999  . Spinal stenosis     Patient Active Problem List   Diagnosis Date Noted  . E-coli UTI 12/29/2018  .  Acute on chronic anemia   . Radiculopathy 12/10/2018  . Cervical myelopathy ([place]) 12/07/2018  . Microcytic hypochromic anemia 08/27/2018  . At risk for adverse drug event 08/10/2018  . Chronic pain syndrome   . Labile blood pressure   . Urinary retention   . Hypokalemia   . Hypertension   . Acute blood loss anemia   . Anemia of chronic disease   . Abscess in epidural space of lumbar spine   . Effusion, left knee   . Pain and swelling of wrist, left   . Complete tear of left rotator cuff 07/03/2018    Class: Chronic  . MSSA bacteremia   . Septic arthritis of knee, left ([place]) 06/30/2018    Class: Acute  . Leukocytosis   . Postoperative seroma involving nervous system after nervous system procedure   . Shoulder pain, bilateral   . Thoracic myelopathy 06/28/2018  . Spondylosis, thoracic, with myelopathy 06/28/2018    Class: Acute  . Abscess in epidural space of L2-L5 lumbar spine 06/28/2018    Class: Acute  . Acute urinary retention 06/28/2018  . Left cervical radiculopathy 06/28/2018    Class: Acute  . Spinal stenosis of lumbar region 11/17/2017    Class: Chronic  . Status post lumbar laminectomy 11/17/2017  . Lumbar stenosis with neurogenic claudication 08/18/2017  . Forestier's  disease of thoracolumbar region 08/18/2017  . Degenerative disc disease, lumbar 08/18/2017  . Lumbar radiculopathy 09/30/2016  . Morbid (severe) obesity due to excess calories (HCC) 09/30/2016  . Biliary calculus with acute cholecystitis 03/21/2016  . Acute pyelonephritis 12/28/2013  . Ureteral stone with hydronephrosis 12/28/2013  . Acute kidney failure (HCC) 12/27/2013  . Severe sepsis with acute organ dysfunction (HCC) 12/27/2013  . Hypotension 12/27/2013  . Lactic acidosis 12/27/2013  . Sepsis secondary to UTI (HCC) 12/27/2013  . EMPYEMA CHEST 08/30/2008  . PRURIGO 08/30/2008  . OBSTRUCTIVE SLEEP APNEA 08/30/2008    Past Surgical History:  Procedure Laterality Date  . ANTERIOR CERVICAL  DECOMP/DISCECTOMY FUSION N/A 12/07/2018   Procedure: ANTERIOR CERVICAL DECOMPRESSION FUSION - CERVICAL THREE-CERVICAL FOUR, CERVICAL FOUR-CERVICAL FIVE, CERVICAL FIVE-CERVICAL SIX;  Surgeon: Julio Sicks, MD;  Location: [facility];  Service: Neurosurgery;  Laterality: N/A;  . BACK SURGERY  01/23/2010   lumbar  . CHOLECYSTECTOMY N/A 03/24/2016   Procedure: LAPAROSCOPIC CHOLECYSTECTOMY WITH INTRAOPERATIVE CHOLANGIOGRAM;  Surgeon: Chevis Pretty III, MD;  Location: [facility];  Service: General;  Laterality: N/A;  . CIRCUMCISION    . CYSTOSCOPY WITH RETROGRADE PYELOGRAM, URETEROSCOPY AND STENT PLACEMENT Left 01/20/2014   Procedure: CYSTOSCOPY WITH RETROGRADE PYELOGRAM, URETEROSCOPY AND STENT EXCHANGE;  Surgeon: Milford Cage, MD;  Location: [facility];  Service: Urology;  Laterality: Left;  bugbee bladder fulguration  . CYSTOSCOPY WITH STENT PLACEMENT Left 12/28/2013   Procedure: CYSTOSCOPY WITH STENT PLACEMENT left retrograde pyleogram;  Surgeon: Milford Cage, MD;  Location: [facility];  Service: Urology;  Laterality: Left;  . ESOPHAGOGASTRODUODENOSCOPY N/A 12/07/2012   Procedure: ESOPHAGOGASTRODUODENOSCOPY (EGD);  Surgeon: Lodema Pilot, DO;  [facility]: [facility];  Service: General;  Laterality: N/A;  . HOLMIUM LASER APPLICATION Left 01/20/2014   Procedure: HOLMIUM LASER APPLICATION;  Surgeon: Milford Cage, MD;  Location: [facility];  Service: Urology;  Laterality: Left;  . KNEE ARTHROSCOPY Left 06/29/2018   Procedure: ARTHROSCOPY KNEE;  Surgeon: Kerrin Champagne, MD;  Location: [facility] OR;  Service: Orthopedics;  Laterality: Left;  . LAPAROSCOPIC GASTRIC SLEEVE RESECTION N/A 12/07/2012   Procedure: LAPAROSCOPIC GASTRIC SLEEVE RESECTION;  Surgeon: Lodema Pilot, DO;  [facility]: [facility];  Service: General;  Laterality: N/A;  laparoscopic sleeve gastrectomy with EGD  . LUMBAR LAMINECTOMY N/A 11/17/2017   Procedure: L2-3 LAMINECTOMY AND REDO LAMINECTOMIES  L3-4, L4-5 AND L5-S1;  Surgeon: Kerrin Champagne, MD;  Location: [facility];   Service: Orthopedics;  Laterality: N/A;  . LUMBAR WOUND DEBRIDEMENT N/A 06/29/2018   Procedure: LUMBAR WOUND DEBRIDEMENT DRAINAGE AND IRRIGATION; AND ASPIRATION OF LEFT KNEE;  Surgeon: Kerrin Champagne, MD;  Location: [facility] OR;  Service: Orthopedics;  Laterality: N/A;  . TEE WITHOUT CARDIOVERSION N/A 07/01/2018   Procedure: TRANSESOPHAGEAL ECHOCARDIOGRAM (TEE);  Surgeon: Laurey Morale, MD;  Location: [facility] ENDOSCOPY;  Service: Cardiovascular;  Laterality: N/A;        Home Medications    Prior to Admission medications   Medication Sig Start Date End Date Taking? Authorizing Provider  acetaminophen (TYLENOL) 325 MG tablet Take 1-2 tablets (325-650 mg total) by mouth every 4 (four) hours as needed for mild pain. 12/14/18   Love, Evlyn Kanner, PA-C  bisacodyl (DULCOLAX) 10 MG suppository Place 1 suppository (10 mg total) rectally daily at 6 (six) AM. 12/30/18   Love, Evlyn Kanner, PA-C  cephALEXin (KEFLEX) 250 MG capsule Take 1 capsule (250 mg total) by mouth every 8 (eight) hours. 12/29/18   Love, Evlyn Kanner, PA-C  diclofenac sodium (VOLTAREN) 1 %  GEL Apply 4 g topically 4 (four) times daily. Patient taking differently: Apply 4 g topically 2 (two) times daily.  09/21/18   Kerrin Champagne, MD  DULoxetine (CYMBALTA) 60 MG capsule Take 1 capsule (60 mg total) by mouth daily. 12/29/18   Love, Evlyn Kanner, PA-C  ferrous gluconate (FERGON) 324 MG tablet Take 1 tablet (324 mg total) by mouth 3 (three) times daily with meals. 12/29/18   Love, Evlyn Kanner, PA-C  meloxicam (MOBIC) 15 MG tablet TAKE 1 TABLET(15 MG) BY MOUTH DAILY 05/12/19   Kerrin Champagne, MD  methocarbamol (ROBAXIN) 500 MG tablet Take 1 tablet (500 mg total) by mouth every 6 (six) hours as needed for muscle spasms. 12/29/18   Love, Evlyn Kanner, PA-C  pantoprazole (PROTONIX) 40 MG tablet Take 1 tablet (40 mg total) by mouth 2 (two) times daily before a meal. 12/29/18   Love, Evlyn Kanner, PA-C  potassium chloride SA (K-DUR,KLOR-CON) 20 MEQ tablet Take 1 tablet (20 mEq total) by  mouth 2 (two) times daily. 12/29/18   Love, Evlyn Kanner, PA-C  senna-docusate (SENOKOT-S) 8.6-50 MG tablet Take 2 tablets by mouth at bedtime. 12/29/18   Love, Evlyn Kanner, PA-C  tamsulosin (FLOMAX) 0.4 MG CAPS capsule Take 1 capsule (0.4 mg total) by mouth daily after supper. 12/29/18   LoveEvlyn Kanner, PA-C    Family History History reviewed. No pertinent family history.  Social History Social History   Tobacco Use  . Smoking status: Former Smoker    Types: Cigarettes    Quit date: 04/09/1974    Years since quitting: 45.3  . Smokeless tobacco: Never Used  Substance Use Topics  . Alcohol use: No  . Drug use: No     Allergies   Unasyn [ampicillin-sulbactam sodium]   Review of Systems Review of Systems  Ten systems reviewed and are negative for acute change, except as noted in the HPI.   Physical Exam Updated Vital Signs BP (!) 153/87   Pulse (!) 58   Temp 98.6 F (37 C) (Oral)   Resp 16   SpO2 97%   Physical Exam Vitals signs and nursing note reviewed. Exam conducted with a chaperone present.  Constitutional:      General: He is not in acute distress.    Appearance: He is well-developed. He is not diaphoretic.  HENT:     Head: Normocephalic and atraumatic.  Eyes:     General: No scleral icterus.    Conjunctiva/sclera: Conjunctivae normal.  Neck:     Musculoskeletal: Normal range of motion and neck supple.  Cardiovascular:     Rate and Rhythm: Normal rate and regular rhythm.     Heart sounds: Normal heart sounds.  Pulmonary:     Effort: Pulmonary effort is normal. No respiratory distress.     Breath sounds: Normal breath sounds.  Abdominal:     Palpations: Abdomen is soft.     Tenderness: There is no abdominal tenderness.  Genitourinary:    Penis: Circumcised.      Scrotum/Testes:        Right: Swelling present.        Left: Swelling present.     Comments: Circumcised male.  There is a chronic indwelling catheter with pale, clear urinary output, there is erosion  of the meatus and urethra along the left side of the penile shaft, no new tearing present.  There is blood around the eroded urethra and catheter just along the area of the penis.  Marked edema of the  penile and scrotal tissues present without tenderness, induration or erythema. Skin:    General: Skin is warm and dry.  Neurological:     Mental Status: He is alert.  Psychiatric:        Behavior: Behavior normal.      ED Treatments / Results  Labs (all labs ordered are listed, but only abnormal results are displayed) Labs Reviewed  BASIC METABOLIC PANEL  CBC WITH DIFFERENTIAL/PLATELET    EKG None  Radiology No results found.  Procedures Procedures (including critical care time)  Medications Ordered in ED Medications  influenza vac split quadrivalent PF (FLUARIX) injection 0.5 mL (has no administration in time range)     Initial Impression / Assessment and Plan / ED Course  I have reviewed the triage vital signs and the nursing notes.  Pertinent labs & imaging results that were available during my care of the patient were reviewed by me and considered in my medical decision making (see chart for details).  Clinical Course as of Aug 03 1420  Wed Aug 04, 2019  1318 Case discussed with Dr. Laverle PatterBorden of [facility] who feels that if his catheter is flowing appropriately there is no other work-up or intervention that needs to be done currently.  Awaiting the patient's BMP.   [AH]    Clinical Course User Index [AH] Arthor CaptainHarris, Shalice Woodring, PA-C       Patient here with bleeding after catheter change.  Personally reviewed the patient's labs which show no acute abnormality.  He has chronic normocytic anemia.  He keeps track patient is hemodynamically stable without hypotension suggestive of hemorrhage.  He has excellent urinary output from the catheter without any retained urine in the bladder.  Discussed the case with Dr. Laverle PatterBorden who asked that she should follow-up in the office in  the next 2 to 3 weeks for catheter change by Dr. Annabell HowellsWrenn.  He states that the bleeding should stop by tamponade alone however he may return to the emergency department if he has significant bleeding catheter obstruction.  I discussed this with patient he feels comfortable with plan for discharge at this time.  Final Clinical Impressions(s) / ED Diagnoses   Final diagnoses:  None    ED Discharge Orders    None       Arthor CaptainHarris, Chan Rosasco, PA-C 08/04/19 1807    Arby BarrettePfeiffer, Marcy, MD 08/07/19 [phone]

## 2019-08-04 NOTE — ED Triage Notes (Signed)
Pt from home via [facility], reports yesterday home health came and replaced his foley catheter at 7pm, afterwards he started not having any output with blood around meatus and genital swelling, when EMS arrived urine started flowing again.  Home health instructed him to call urologist who then instructed him to come to ED. Pt is WC bound.

## 2019-08-12 ENCOUNTER — Other Ambulatory Visit: Payer: Self-pay

## 2019-08-12 ENCOUNTER — Ambulatory Visit (INDEPENDENT_AMBULATORY_CARE_PROVIDER_SITE_OTHER): Payer: No Typology Code available for payment source | Admitting: Specialist

## 2019-08-12 ENCOUNTER — Encounter: Payer: Self-pay | Admitting: Specialist

## 2019-08-12 ENCOUNTER — Ambulatory Visit: Payer: Self-pay

## 2019-08-12 VITALS — BP 140/91 | HR 69 | Ht 70.0 in | Wt 310.0 lb

## 2019-08-12 DIAGNOSIS — M25562 Pain in left knee: Secondary | ICD-10-CM | POA: Diagnosis not present

## 2019-08-12 DIAGNOSIS — M4712 Other spondylosis with myelopathy, cervical region: Secondary | ICD-10-CM

## 2019-08-12 DIAGNOSIS — M1712 Unilateral primary osteoarthritis, left knee: Secondary | ICD-10-CM

## 2019-08-12 MED ORDER — METHYLPREDNISOLONE ACETATE 40 MG/ML IJ SUSP
40.0000 mg | INTRAMUSCULAR | Status: AC | PRN
  Administered 2019-08-12: 40 mg via INTRA_ARTICULAR

## 2019-08-12 MED ORDER — BUPIVACAINE HCL 0.5 % IJ SOLN
3.0000 mL | INTRAMUSCULAR | Status: AC | PRN
  Administered 2019-08-12: 3 mL via INTRA_ARTICULAR

## 2019-08-12 NOTE — Progress Notes (Signed)
Office Visit Note   Patient: [name] [name]           Date of Birth: 26-Dec-1955           MRN: [id number] Visit Date: 08/12/2019              Requested by: Seward Carol, MD [address] [place],  [place] [address] PCP: Seward Carol, MD   Assessment & Plan: Visit Diagnoses:  1. Left knee pain, unspecified chronicity     Plan:  Avoid prolong standing and walking. Exercise with the knee as tolerated,stationary bike or pool walking . Use ice to the right knee  30 min on and 15 min off as needed. Tramadol for pain . Take meloxicam for antiinflamatory affect. Kandice Hams, surgery scheduling secretary will call you to arrange for Surgery. Surgery recommended is a right total knee replacement. Risks of surgery include risk of infection 1 in 100. Risk of bleeding  Less than 0.5% chance of needing a blood transfusion.   Follow-Up Instructions: No follow-ups on file.   Orders:  Orders Placed This Encounter  Procedures   XR Knee 1-2 Views Left   No orders of the defined types were placed in this encounter.     Procedures: Large Joint Inj: L knee on 08/12/2019 4:15 PM Indications: pain Details: 25 G 1.5 in needle, anterolateral approach  Arthrogram: No  Medications: 40 mg methylPREDNISolone acetate 40 MG/ML; 3 mL bupivacaine 0.5 % Outcome: tolerated well, no immediate complications Procedure, treatment alternatives, risks and benefits explained, specific risks discussed. Consent was given by the patient. Immediately prior to procedure a time out was called to verify the correct patient, procedure, equipment, support staff and site/side marked as required. Patient was prepped and draped in the usual sterile fashion.       Clinical Data: No additional findings.   Subjective: Chief Complaint  Patient presents with   Left Knee - Pain    64 year old male with history of lumbar laminectomy for severe spinal stenosis with post operative infection. He  underwent I and D of the lumbar infection but was profoundly weak with findings of bilateral LE weakness and inability to stand or walk independently. He was found to have cervical stenosis and was referred to Dr. Vernice Jefferson for decompression And fusion and this was done. He presents today with severe left knee pain. He is not ambulating or standing due to leg weaken   Review of Systems  Constitutional: Negative.   HENT: Negative.   Eyes: Negative.   Respiratory: Negative.   Cardiovascular: Negative.   Endocrine: Negative.   Genitourinary: Negative.   Allergic/Immunologic: Negative.   Neurological: Negative.   Hematological: Negative.      Objective: Vital Signs: BP (!) 140/91 (BP Location: Left Arm, Patient Position: Sitting)    Pulse 69    Ht 5\' 10"  (1.778 m)    Wt (!) 310 lb (140.6 kg)    BMI 44.48 kg/m   Physical Exam Musculoskeletal:     Left knee: He exhibits effusion.     Left Knee Exam   Tenderness  The patient is experiencing tenderness in the medial retinaculum and medial joint line.  Range of Motion  Extension:  -10 abnormal  Flexion:  120 normal   Tests  McMurray:  Medial - positive  Varus: positive  Lachman:  Anterior - negative    Posterior - negative Drawer:  Anterior - negative     Posterior - negative Pivot shift:  negative Patellar apprehension: positive  Other  Erythema: absent Sensation: normal Pulse: absent Swelling: moderate Effusion: effusion present      Specialty Comments:  No specialty comments available.  Imaging: Xr Knee 1-2 Views Left  Result Date: 08/12/2019 AP and lateral left knee with varus deformity left knee 7-8 degrees, narrowing of the medial left knee joint line with bone on bone appearance to the medial joint and a large medial tibial plateau osteophyte and mild lateral joint osteophyte. The lateral radiograph shouse narrowing of the patellofemoral joint line with superior pole osteophyte and minimal inferior pole  osteophyte. Findings consistent with severe tricompartment OA of the left knee.    PMFS History: Patient Active Problem List   Diagnosis Date Noted   Septic arthritis of knee, left (HCC) 06/30/2018    Priority: High    Class: Acute   Spondylosis, thoracic, with myelopathy 06/28/2018    Priority: High    Class: Acute   Abscess in epidural space of L2-L5 lumbar spine 06/28/2018    Priority: High    Class: Acute   Left cervical radiculopathy 06/28/2018    Priority: High    Class: Acute   Spinal stenosis of lumbar region 11/17/2017    Priority: High    Class: Chronic   Complete tear of left rotator cuff 07/03/2018    Priority: Medium    Class: Chronic   E-coli UTI 12/29/2018   Acute on chronic anemia    Radiculopathy 12/10/2018   Cervical myelopathy (HCC) 12/07/2018   Microcytic hypochromic anemia 08/27/2018   At risk for adverse drug event 08/10/2018   Chronic pain syndrome    Labile blood pressure    Urinary retention    Hypokalemia    Hypertension    Acute blood loss anemia    Anemia of chronic disease    Abscess in epidural space of lumbar spine    Effusion, left knee    Pain and swelling of wrist, left    MSSA bacteremia    Leukocytosis    Postoperative seroma involving nervous system after nervous system procedure    Shoulder pain, bilateral    Thoracic myelopathy 06/28/2018   Acute urinary retention 06/28/2018   Status post lumbar laminectomy 11/17/2017   Lumbar stenosis with neurogenic claudication 08/18/2017   Forestier's disease of thoracolumbar region 08/18/2017   Degenerative disc disease, lumbar 08/18/2017   Lumbar radiculopathy 09/30/2016   Morbid (severe) obesity due to excess calories (HCC) 09/30/2016   Biliary calculus with acute cholecystitis 03/21/2016   Acute pyelonephritis 12/28/2013   Ureteral stone with hydronephrosis 12/28/2013   Acute kidney failure (HCC) 12/27/2013   Severe sepsis with acute organ  dysfunction (HCC) 12/27/2013   Hypotension 12/27/2013   Lactic acidosis 12/27/2013   Sepsis secondary to UTI (HCC) 12/27/2013   EMPYEMA CHEST 08/30/2008   PRURIGO 08/30/2008   OBSTRUCTIVE SLEEP APNEA 08/30/2008   Past Medical History:  Diagnosis Date   Anemia    Arthritis    Chronic kidney disease    HX acute kidney failure / acute pyelonephritis / hydronephrosis / severe sepsis per discharge summary 12/27/13   GERD (gastroesophageal reflux disease)    History of kidney stones    Hypertension    Morbid obesity (HCC)    Obstructive sleep apnea    does not need c pap since 110 lb wt loss   Osteoporosis    Prurigo 2002   Scars    ON ARMS FROM CHEMICAL EXPLOSION 1999   Spinal stenosis  No family history on file.  Past Surgical History:  Procedure Laterality Date   ANTERIOR CERVICAL DECOMP/DISCECTOMY FUSION N/A 12/07/2018   Procedure: ANTERIOR CERVICAL DECOMPRESSION FUSION - CERVICAL THREE-CERVICAL FOUR, CERVICAL FOUR-CERVICAL FIVE, CERVICAL FIVE-CERVICAL SIX;  Surgeon: Julio Sicks, MD;  Location: [facility] OR;  Service: Neurosurgery;  Laterality: N/A;   BACK SURGERY  01/23/2010   lumbar   CHOLECYSTECTOMY N/A 03/24/2016   Procedure: LAPAROSCOPIC CHOLECYSTECTOMY WITH INTRAOPERATIVE CHOLANGIOGRAM;  Surgeon: Chevis Pretty III, MD;  Location: [facility];  Service: General;  Laterality: N/A;   CIRCUMCISION     CYSTOSCOPY WITH RETROGRADE PYELOGRAM, URETEROSCOPY AND STENT PLACEMENT Left 01/20/2014   Procedure: CYSTOSCOPY WITH RETROGRADE PYELOGRAM, URETEROSCOPY AND STENT EXCHANGE;  Surgeon: Milford Cage, MD;  Location: [facility];  Service: Urology;  Laterality: Left;  bugbee bladder fulguration   CYSTOSCOPY WITH STENT PLACEMENT Left 12/28/2013   Procedure: CYSTOSCOPY WITH STENT PLACEMENT left retrograde pyleogram;  Surgeon: Milford Cage, MD;  Location: [facility];  Service: Urology;  Laterality: Left;   ESOPHAGOGASTRODUODENOSCOPY N/A 12/07/2012   Procedure:  ESOPHAGOGASTRODUODENOSCOPY (EGD);  Surgeon: Lodema Pilot, DO;  Location: [facility];  Service: General;  Laterality: N/A;   HOLMIUM LASER APPLICATION Left 01/20/2014   Procedure: HOLMIUM LASER APPLICATION;  Surgeon: Milford Cage, MD;  Location: [facility];  Service: Urology;  Laterality: Left;   KNEE ARTHROSCOPY Left 06/29/2018   Procedure: ARTHROSCOPY KNEE;  Surgeon: Kerrin Champagne, MD;  Location: [facility] OR;  Service: Orthopedics;  Laterality: Left;   LAPAROSCOPIC GASTRIC SLEEVE RESECTION N/A 12/07/2012   Procedure: LAPAROSCOPIC GASTRIC SLEEVE RESECTION;  Surgeon: Lodema Pilot, DO;  Location: [facility];  Service: General;  Laterality: N/A;  laparoscopic sleeve gastrectomy with EGD   LUMBAR LAMINECTOMY N/A 11/17/2017   Procedure: L2-3 LAMINECTOMY AND REDO LAMINECTOMIES  L3-4, L4-5 AND L5-S1;  Surgeon: Kerrin Champagne, MD;  Location: [facility];  Service: Orthopedics;  Laterality: N/A;   LUMBAR WOUND DEBRIDEMENT N/A 06/29/2018   Procedure: LUMBAR WOUND DEBRIDEMENT DRAINAGE AND IRRIGATION; AND ASPIRATION OF LEFT KNEE;  Surgeon: Kerrin Champagne, MD;  Location: [facility];  Service: Orthopedics;  Laterality: N/A;   TEE WITHOUT CARDIOVERSION N/A 07/01/2018   Procedure: TRANSESOPHAGEAL ECHOCARDIOGRAM (TEE);  Surgeon: Laurey Morale, MD;  Location: [facility] END[facility]COPY;  Service: Cardiovascular;  Laterality: N/A;   Social History   Occupational History   Not on file  Tobacco Use   Smoking status: Former Smoker    Types: Cigarettes    Quit date: 04/09/1974    Years since quitting: 45.3   Smokeless tobacco: Never Used  Substance and Sexual Activity   Alcohol use: No   Drug use: No   Sexual activity: Not Currently

## 2019-08-12 NOTE — Patient Instructions (Signed)
Avoid prolong standing and walking. Exercise with the knee as tolerated,stationary bike or pool walking . Use ice to the right knee  30 min on and 15 min off as needed. Tramadol for pain . Take meloxicam for antiinflamatory affect. Sherri Billings, surgery scheduling secretary will call you to arrange for Surgery. Surgery recommended is a right total knee replacement. Risks of surgery include risk of infection 1 in 300. Risk of bleeding  Less than 1 5 chance of needing a blood transfusion.  

## 2019-09-21 ENCOUNTER — Other Ambulatory Visit: Payer: Self-pay | Admitting: Specialist

## 2019-09-21 MED ORDER — HYDROCODONE-ACETAMINOPHEN 5-325 MG PO TABS: 1.0000 | ORAL_TABLET | Freq: Three times a day (TID) | ORAL | 0 refills | Status: DC | PRN

## 2019-09-21 NOTE — Telephone Encounter (Signed)
Pt called in requesting a refill on Hydrocodone, please have that sent to [facility] on [address].    [phone]

## 2019-09-21 NOTE — Telephone Encounter (Signed)
Sent request to Dr. Ntika  

## 2019-11-18 ENCOUNTER — Encounter (HOSPITAL_BASED_OUTPATIENT_CLINIC_OR_DEPARTMENT_OTHER): Payer: No Typology Code available for payment source | Admitting: Internal Medicine

## 2019-11-30 ENCOUNTER — Encounter (HOSPITAL_BASED_OUTPATIENT_CLINIC_OR_DEPARTMENT_OTHER): Payer: No Typology Code available for payment source | Admitting: Internal Medicine

## 2020-04-03 ENCOUNTER — Other Ambulatory Visit: Payer: Self-pay

## 2020-04-03 MED ORDER — HYDROCODONE-ACETAMINOPHEN 5-325 MG PO TABS: 1.0000 | ORAL_TABLET | Freq: Three times a day (TID) | ORAL | 0 refills | Status: DC | PRN

## 2020-04-03 NOTE — Telephone Encounter (Signed)
Needs an appointment in follow up as he needs a renewal of narcotic. jen

## 2020-04-03 NOTE — Telephone Encounter (Signed)
Patient called in wanting to get a refill on hyrdocodone.

## 2020-04-27 ENCOUNTER — Encounter: Payer: Self-pay | Admitting: Surgery

## 2020-04-27 ENCOUNTER — Ambulatory Visit (INDEPENDENT_AMBULATORY_CARE_PROVIDER_SITE_OTHER): Payer: No Typology Code available for payment source | Admitting: Surgery

## 2020-04-27 ENCOUNTER — Ambulatory Visit (INDEPENDENT_AMBULATORY_CARE_PROVIDER_SITE_OTHER): Payer: No Typology Code available for payment source

## 2020-04-27 VITALS — BP 171/99 | HR 76 | Ht 70.0 in | Wt 310.0 lb

## 2020-04-27 DIAGNOSIS — M25562 Pain in left knee: Secondary | ICD-10-CM | POA: Diagnosis not present

## 2020-04-27 NOTE — Progress Notes (Signed)
Office Visit Note   Patient: [name] [name]           Date of Birth: December 07, 1955           MRN: [id number] Visit Date: 04/27/2020              Requested by: Renford Dills, MD [address] [place],  [place] [address] PCP: Renford Dills, MD   Assessment & Plan: Visit Diagnoses:  1. Left knee pain, unspecified chronicity     Plan: Patient has requested repeat intra-articular Marcaine/Depo-Medrol injection.  I advised him that with the significant degenerative changes that he has in his knee that the injection would not be of any long-term benefit.  I recommend the process started for left total knee replacement as Dr. Ferdie Ping documented last office visit October 2020.  Will fax over clearances.  Patient will follow up in the office in 6 weeks for recheck with Dr. Ferdie Ping for repeat exam and discuss whether or not total knee replacement is still the best option.  Hopefully will have all clearances by that time.  Follow-Up Instructions: Return in about 6 weeks (around 06/08/2020) for with dr Otelia Sergeant to discuss possible scheduling of left total knee replacement and review clearances. .   Orders:  Orders Placed This Encounter  Procedures  . XR Knee 1-2 Views Left   No orders of the defined types were placed in this encounter.     Procedures: No procedures performed   Clinical Data: No additional findings.   Subjective: Chief Complaint  Patient presents with  . Left Knee - Pain    History of present illness 64 year old black male comes in with complaints of left knee pain.  He has known history of end-stage DJD left knee and was last seen by Dr. Otelia Sergeant for this October 2020 and total knee replacement was recommended.  Patient states that he does not recall being contacted for scheduling.  He continues have ongoing pain in his left knee.  He was asking about repeat left knee injection today with the last one being October 2020.   Objective: Vital Signs: BP (!) 171/99    Pulse 76   Ht 5\' 10"  (1.778 m)   Wt (!) 310 lb (140.6 kg)   BMI 44.48 kg/m   Physical Exam Left knee positive crepitus.  Some swelling without significant effusion.  Joint line tender.  Calf is nontender. Ortho Exam  Specialty Comments:  No specialty comments available.  Imaging: No results found.   PMFS History: Patient Active Problem List   Diagnosis Date Noted  . E-coli UTI 12/29/2018  . Acute on chronic anemia   . Radiculopathy 12/10/2018  . Cervical myelopathy (HCC) 12/07/2018  . Microcytic hypochromic anemia 08/27/2018  . At risk for adverse drug event 08/10/2018  . Chronic pain syndrome   . Labile blood pressure   . Urinary retention   . Hypokalemia   . Hypertension   . Acute blood loss anemia   . Anemia of chronic disease   . Abscess in epidural space of lumbar spine   . Effusion, left knee   . Pain and swelling of wrist, left   . Complete tear of left rotator cuff 07/03/2018    Class: Chronic  . MSSA bacteremia   . Septic arthritis of knee, left (HCC) 06/30/2018    Class: Acute  . Leukocytosis   . Postoperative seroma involving nervous system after nervous system procedure   . Shoulder pain, bilateral   .  Thoracic myelopathy 06/28/2018  . Spondylosis, thoracic, with myelopathy 06/28/2018    Class: Acute  . Abscess in epidural space of L2-L5 lumbar spine 06/28/2018    Class: Acute  . Acute urinary retention 06/28/2018  . Left cervical radiculopathy 06/28/2018    Class: Acute  . Spinal stenosis of lumbar region 11/17/2017    Class: Chronic  . Status post lumbar laminectomy 11/17/2017  . Lumbar stenosis with neurogenic claudication 08/18/2017  . Forestier's disease of thoracolumbar region 08/18/2017  . Degenerative disc disease, lumbar 08/18/2017  . Lumbar radiculopathy 09/30/2016  . Morbid (severe) obesity due to excess calories (HCC) 09/30/2016  . Biliary calculus with acute cholecystitis 03/21/2016  . Acute pyelonephritis 12/28/2013  . Ureteral  stone with hydronephrosis 12/28/2013  . Acute kidney failure (HCC) 12/27/2013  . Severe sepsis with acute organ dysfunction (HCC) 12/27/2013  . Hypotension 12/27/2013  . Lactic acidosis 12/27/2013  . Sepsis secondary to UTI (HCC) 12/27/2013  . EMPYEMA CHEST 08/30/2008  . PRURIGO 08/30/2008  . OBSTRUCTIVE SLEEP APNEA 08/30/2008   Past Medical History:  Diagnosis Date  . Anemia   . Arthritis   . Chronic kidney disease    HX acute kidney failure / acute pyelonephritis / hydronephrosis / severe sepsis per discharge summary 12/27/13  . GERD (gastroesophageal reflux disease)   . History of kidney stones   . Hypertension   . Morbid obesity (HCC)   . Obstructive sleep apnea    does not need c pap since 110 lb wt loss  . Osteoporosis   . Prurigo 2002  . Scars    ON ARMS FROM CHEMICAL EXPLOSION 1999  . Spinal stenosis     History reviewed. No pertinent family history.  Past Surgical History:  Procedure Laterality Date  . ANTERIOR CERVICAL DECOMP/DISCECTOMY FUSION N/A 12/07/2018   Procedure: ANTERIOR CERVICAL DECOMPRESSION FUSION - CERVICAL THREE-CERVICAL FOUR, CERVICAL FOUR-CERVICAL FIVE, CERVICAL FIVE-CERVICAL SIX;  Surgeon: Julio Sicks, MD;  Location: [facility] OR;  Service: Neurosurgery;  Laterality: N/A;  . BACK SURGERY  01/23/2010   lumbar  . CHOLECYSTECTOMY N/A 03/24/2016   Procedure: LAPAROSCOPIC CHOLECYSTECTOMY WITH INTRAOPERATIVE CHOLANGIOGRAM;  Surgeon: Chevis Pretty III, MD;  Location: [facility] ORS;  Service: General;  Laterality: N/A;  . CIRCUMCISION    . CYSTOSCOPY WITH RETROGRADE PYELOGRAM, URETEROSCOPY AND STENT PLACEMENT Left 01/20/2014   Procedure: CYSTOSCOPY WITH RETROGRADE PYELOGRAM, URETEROSCOPY AND STENT EXCHANGE;  Surgeon: Milford Cage, MD;  Location: [facility];  Service: Urology;  Laterality: Left;  bugbee bladder fulguration  . CYSTOSCOPY WITH STENT PLACEMENT Left 12/28/2013   Procedure: CYSTOSCOPY WITH STENT PLACEMENT left retrograde pyleogram;  Surgeon: Milford Cage,  MD;  Location: [facility];  Service: Urology;  Laterality: Left;  . ESOPHAGOGASTRODUODENOSCOPY N/A 12/07/2012   Procedure: ESOPHAGOGASTRODUODENOSCOPY (EGD);  Surgeon: Lodema Pilot, DO;  Location: [facility];  Service: General;  Laterality: N/A;  . HOLMIUM LASER APPLICATION Left 01/20/2014   Procedure: HOLMIUM LASER APPLICATION;  Surgeon: Milford Cage, MD;  Location: [facility];  Service: Urology;  Laterality: Left;  . KNEE ARTHROSCOPY Left 06/29/2018   Procedure: ARTHROSCOPY KNEE;  Surgeon: Kerrin Champagne, MD;  Location: [facility] OR;  Service: Orthopedics;  Laterality: Left;  . LAPAROSCOPIC GASTRIC SLEEVE RESECTION N/A 12/07/2012   Procedure: LAPAROSCOPIC GASTRIC SLEEVE RESECTION;  Surgeon: Lodema Pilot, DO;  Location: [facility];  Service: General;  Laterality: N/A;  laparoscopic sleeve gastrectomy with EGD  . LUMBAR LAMINECTOMY N/A 11/17/2017   Procedure: L2-3 LAMINECTOMY AND REDO LAMINECTOMIES  L3-4, L4-5 AND L5-S1;  Surgeon:  Kerrin Champagne, MD;  Location[facility] OR;  Service: Orthopedics;  Laterality: N/A;  . LUMBAR WOUND DEBRIDEMENT N/A 06/29/2018   Procedure: LUMBAR WOUND DEBRIDEMENT DRAINAGE AND IRRIGATION; AND ASPIRATION OF LEFT KNEE;  Surgeon: Kerrin Champagne, MD;  Location: [facility] OR;  Service: Orthopedics;  Laterality: N/A;  . TEE WITHOUT CARDIOVERSION N/A 07/01/2018   Procedure: TRANSESOPHAGEAL ECHOCARDIOGRAM (TEE);  Surgeon: Laurey Morale, MD;  Location: [facility] ENDOSCOPY;  Service: Cardiovascular;  Laterality: N/A;   Social History   Occupational History  . Not on file  Tobacco Use  . Smoking status: Former Smoker    Types: Cigarettes    Quit date: 04/09/1974    Years since quitting: 46.0  . Smokeless tobacco: Never Used  Vaping Use  . Vaping Use: Never used  Substance and Sexual Activity  . Alcohol use: No  . Drug use: No  . Sexual activity: Not Currently

## 2020-07-14 ENCOUNTER — Telehealth: Payer: Self-pay | Admitting: Specialist

## 2020-07-14 NOTE — Telephone Encounter (Signed)
Patient called.   He wanted to know if he could be prescribed hydrocodone  Call back: [phone]

## 2020-07-14 NOTE — Telephone Encounter (Signed)
Please advise 

## 2020-07-31 ENCOUNTER — Telehealth: Payer: Self-pay | Admitting: Specialist

## 2020-07-31 ENCOUNTER — Other Ambulatory Visit: Payer: Self-pay

## 2020-07-31 NOTE — Telephone Encounter (Signed)
Rx request for Hydrocodone sent to Dr. Otelia Sergeant.

## 2020-07-31 NOTE — Telephone Encounter (Signed)
Patient called. He would like a RX for hydrocodone called in. His call back number is [phone]

## 2020-09-26 ENCOUNTER — Other Ambulatory Visit: Payer: Self-pay | Admitting: Urology

## 2020-09-26 ENCOUNTER — Other Ambulatory Visit (HOSPITAL_COMMUNITY): Payer: Self-pay | Admitting: Urology

## 2020-09-26 DIAGNOSIS — N312 Flaccid neuropathic bladder, not elsewhere classified: Secondary | ICD-10-CM

## 2020-09-26 DIAGNOSIS — R338 Other retention of urine: Secondary | ICD-10-CM

## 2020-10-03 ENCOUNTER — Ambulatory Visit (HOSPITAL_COMMUNITY)
Admission: RE | Admit: 2020-10-03 | Discharge: 2020-10-03 | Disposition: A | Discharge status: Home / Self Care | Payer: No Typology Code available for payment source | Source: Ambulatory Visit | Attending: Urology | Admitting: Urology

## 2020-10-03 ENCOUNTER — Other Ambulatory Visit: Payer: Self-pay

## 2020-10-03 DIAGNOSIS — N312 Flaccid neuropathic bladder, not elsewhere classified: Secondary | ICD-10-CM | POA: Insufficient documentation

## 2020-10-03 DIAGNOSIS — R338 Other retention of urine: Secondary | ICD-10-CM | POA: Insufficient documentation

## 2021-03-20 ENCOUNTER — Other Ambulatory Visit: Payer: Self-pay | Admitting: Urology

## 2021-03-20 DIAGNOSIS — N200 Calculus of kidney: Secondary | ICD-10-CM

## 2021-03-27 ENCOUNTER — Ambulatory Visit (HOSPITAL_COMMUNITY)
Admission: RE | Admit: 2021-03-27 | Discharge: 2021-03-27 | Disposition: A | Discharge status: Home / Self Care | Payer: No Typology Code available for payment source | Source: Ambulatory Visit | Attending: Urology | Admitting: Urology

## 2021-03-27 ENCOUNTER — Other Ambulatory Visit: Payer: Self-pay

## 2021-03-27 DIAGNOSIS — N200 Calculus of kidney: Secondary | ICD-10-CM | POA: Insufficient documentation

## 2021-04-17 ENCOUNTER — Ambulatory Visit (HOSPITAL_COMMUNITY)
Admission: RE | Admit: 2021-04-17 | Discharge: 2021-04-17 | Disposition: A | Discharge status: Home / Self Care | Payer: No Typology Code available for payment source | Source: Ambulatory Visit | Attending: Urology | Admitting: Urology

## 2021-04-17 ENCOUNTER — Other Ambulatory Visit: Payer: Self-pay

## 2021-04-17 ENCOUNTER — Other Ambulatory Visit (HOSPITAL_COMMUNITY): Payer: Self-pay | Admitting: Urology

## 2021-04-17 DIAGNOSIS — N200 Calculus of kidney: Secondary | ICD-10-CM | POA: Diagnosis present

## 2021-04-19 ENCOUNTER — Other Ambulatory Visit: Payer: Self-pay | Admitting: Urology

## 2021-04-20 ENCOUNTER — Other Ambulatory Visit: Payer: Self-pay | Admitting: Urology

## 2021-05-09 NOTE — Patient Instructions (Signed)
DUE TO COVID-19 ONLY ONE VISITOR IS ALLOWED TO COME WITH YOU AND STAY IN THE WAITING ROOM ONLY DURING PRE OP AND PROCEDURE DAY OF SURGERY. THE 1 VISITOR  MAY VISIT WITH YOU AFTER SURGERY IN YOUR PRIVATE ROOM DURING VISITING HOURS ONLY!               [name] [name]   Your procedure is scheduled on: 05/15/21   Report to [facility] Main  Entrance   Report to short stay at : 5:15 AM     Call this number if you have problems the morning of surgery [phone]    Remember: Do not eat food or drink liquids :After Midnight.   BRUSH YOUR TEETH MORNING OF SURGERY AND RINSE YOUR MOUTH OUT, NO CHEWING GUM CANDY OR MINTS.    Take these medicines the morning of surgery with A SIP OF WATER: N/A                               You may not have any metal on your body including hair pins and              piercings  Do not wear jewelry, lotions, powders or perfumes, deodorant             Men may shave face and neck.   Do not bring valuables to the hospital. [facility] IS NOT             RESPONSIBLE   FOR VALUABLES.  Contacts, dentures or bridgework may not be worn into surgery.  Leave suitcase in the car. After surgery it may be brought to your room.     Patients discharged the day of surgery will not be allowed to drive home. IF YOU ARE HAVING SURGERY AND GOING HOME THE SAME DAY, YOU MUST HAVE AN ADULT TO DRIVE YOU HOME AND BE WITH YOU FOR 24 HOURS. YOU MAY GO HOME BY TAXI OR UBER OR ORTHERWISE, BUT AN ADULT MUST ACCOMPANY YOU HOME AND STAY WITH YOU FOR 24 HOURS.  Name and phone number of your driver:  Special Instructions: N/A              Please read over the following fact sheets you were given: _____________________________________________________________________           [facility] - Preparing for Surgery Before surgery, you can play an important role.  Because skin is not sterile, your skin needs to be as free of germs as possible.  You can reduce the number of germs on your skin by  washing with CHG (chlorahexidine gluconate) soap before surgery.  CHG is an antiseptic cleaner which kills germs and bonds with the skin to continue killing germs even after washing. Please DO NOT use if you have an allergy to CHG or antibacterial soaps.  If your skin becomes reddened/irritated stop using the CHG and inform your nurse when you arrive at Short Stay. Do not shave (including legs and underarms) for at least 48 hours prior to the first CHG shower.  You may shave your face/neck. Please follow these instructions carefully:  1.  Shower with CHG Soap the night before surgery and the  morning of Surgery.  2.  If you choose to wash your hair, wash your hair first as usual with your  normal  shampoo.  3.  After you shampoo, rinse your hair and body thoroughly to remove the  shampoo.  4.  Use CHG as you would any other liquid soap.  You can apply chg directly  to the skin and wash                       Gently with a scrungie or clean washcloth.  5.  Apply the CHG Soap to your body ONLY FROM THE NECK DOWN.   Do not use on face/ open                           Wound or open sores. Avoid contact with eyes, ears mouth and genitals (private parts).                       Wash face,  Genitals (private parts) with your normal soap.             6.  Wash thoroughly, paying special attention to the area where your surgery  will be performed.  7.  Thoroughly rinse your body with warm water from the neck down.  8.  DO NOT shower/wash with your normal soap after using and rinsing off  the CHG Soap.                9.  Pat yourself dry with a clean towel.            10.  Wear clean pajamas.            11.  Place clean sheets on your bed the night of your first shower and do not  sleep with pets. Day of Surgery : Do not apply any lotions/deodorants the morning of surgery.  Please wear clean clothes to the hospital/surgery center.  FAILURE TO FOLLOW THESE INSTRUCTIONS MAY RESULT IN THE  CANCELLATION OF YOUR SURGERY PATIENT SIGNATURE_________________________________  NURSE SIGNATURE__________________________________  ________________________________________________________________________

## 2021-05-10 ENCOUNTER — Other Ambulatory Visit: Payer: Self-pay

## 2021-05-10 ENCOUNTER — Encounter (HOSPITAL_COMMUNITY)
Admission: RE | Admit: 2021-05-10 | Discharge: 2021-05-10 | Disposition: A | Discharge status: Home / Self Care | Payer: No Typology Code available for payment source | Source: Ambulatory Visit | Attending: Urology | Admitting: Urology

## 2021-05-10 ENCOUNTER — Encounter (HOSPITAL_COMMUNITY): Payer: Self-pay

## 2021-05-10 DIAGNOSIS — Z01818 Encounter for other preprocedural examination: Secondary | ICD-10-CM | POA: Insufficient documentation

## 2021-05-10 LAB — CBC
HCT: 39 % (ref 39.0–52.0)
Hemoglobin: 12 g/dL — ABNORMAL LOW (ref 13.0–17.0)
MCH: 24.9 pg — ABNORMAL LOW (ref 26.0–34.0)
MCHC: 30.8 g/dL (ref 30.0–36.0)
MCV: 80.9 fL (ref 80.0–100.0)
Platelets: 266 10*3/uL (ref 150–400)
RBC: 4.82 MIL/uL (ref 4.22–5.81)
RDW: 18.6 % — ABNORMAL HIGH (ref 11.5–15.5)
WBC: 5.7 10*3/uL (ref 4.0–10.5)
nRBC: 0 % (ref 0.0–0.2)

## 2021-05-10 LAB — BASIC METABOLIC PANEL
Anion gap: 9 (ref 5–15)
BUN: 16 mg/dL (ref 8–23)
CO2: 26 mmol/L (ref 22–32)
Calcium: 8.4 mg/dL — ABNORMAL LOW (ref 8.9–10.3)
Chloride: 104 mmol/L (ref 98–111)
Creatinine, Ser: 0.79 mg/dL (ref 0.61–1.24)
GFR, Estimated: >60 mL/min (ref >60)
Glucose, Bld: 96 mg/dL (ref 70–99)
Potassium: 3.5 mmol/L (ref 3.5–5.1)
Sodium: 139 mmol/L (ref 135–145)

## 2021-05-10 NOTE — Progress Notes (Signed)
COVID Vaccine Completed: Yes Date COVID Vaccine completed: 01/2021 x 2 COVID vaccine manufacturer: Pfizer      PCP - Dr. Renford Dills Cardiologist -   Chest x-ray -  EKG -  Stress Test -  ECHO - 06/29/18 EPIC Cardiac Cath -  Pacemaker/ICD device last checked:  Sleep Study - Yes CPAP - Yes  Fasting Blood Sugar -  Checks Blood Sugar _____ times a day  Blood Thinner Instructions: Aspirin Instructions: Last Dose:  Anesthesia review: Hx: HTN,OSA(CPAP at times)  Patient denies shortness of breath, fever, cough and chest pain at PAT appointment   Patient verbalized understanding of instructions that were given to them at the PAT appointment. Patient was also instructed that they will need to review over the PAT instructions again at home before surgery.

## 2021-05-14 NOTE — H&P (Signed)
CC: Urinary retention, blood in catheter bag   HPI: 65 year old male who presents with a past medical history of neurogenic bladder. He is currently been managed with a catheter placement since 2019 and has yearly follow ups with Dr. Annabell Howells. Over the past 3 years, home health has been changing his catheter. Per his report, they have been having increased difficulty with insertion of the catheter and the catheter has had some increased bloody drainage. He has also noticed a purple tinge to his catheter bag and reports an increased amount of sediment within the catheter bag. He denies flank pain, fevers, chills, nausea, vomiting and any suprapubic pain or pressure.    03/19/21: [name] [name] returns today in f/u. He has a foley for bladder management that has been changed by home health monthly. He has had the catheter for about 3 years. He has lost 10 lb with [employer]. He has had no difficulty with the foley. He had cystoscopy with dilation in 2/22 and the bladder was noted to be unremarkable. He had a spinal injury from an epidural abscess and is gradually regaining some strength but he isn't walking yet. He has had no hematuria or flank pain. he does have a history of stones and had a 1.3cm RLP stone on [place] in 12/21 at [facility] and the stone was 0.9 on our [place] in 1/21.   05/01/2021: CT imaging ordered after last office visit showed a 9 mm nonobstructing calculi within the right ureter. Also numerous nonobstructing calculi noted within the right kidney itself. KUB obtained after CT imaging also confirmed the presence of the right proximal ureteral calculi. He is scheduled to undergo cystoscopy with right-sided ureteroscopy on 08/02 with his urologist. He continues with an indwelling Foley catheter. He does well with this endorsing good urine output without bothersome or painful urgency. Denies leaking around Catheter tubing, interval recurrence of gross hematuria. He has had no right-sided pain or discomfort  suggestive of obstructive uropathy.   Patient denies any changes to past medical history, prescription medications taken on a daily basis. Denies recent treatment for UTI. No interval fevers or chills, nausea/vomiting.     ALLERGIES: Unasyn SOLR    MEDICATIONS: Oxycodone Hcl 5 mg tablet Oral     GU PSH: Complex cystometrogram, w/ void pressure and urethral pressure profile studies, any technique - 12/16/2019, 2020 Complex Uroflow - 12/16/2019, 2020 Cysto Dilate Stricture (M or F) - 11/16/2020 Cysto Uretero Lithotripsy - 2015 Cystoscopy Insert Stent - 2015, 2015 Emg surf Electrd - 12/16/2019, 2020 Inject For cystogram - 12/16/2019, 2020 Insert Bladder Cath; Complex - 11/16/2020 Intrabd voidng Press - 12/16/2019, 2020 Non-Newborn Circumcision - 2010       PSH Notes: Cystoscopy With Ureteroscopy With Lithotripsy, Cystoscopy With Insertion Of Ureteral Stent Left, Back Surgery, Cystoscopy With Insertion Of Ureteral Stent Left, Circumcision No Clamp/Device/Dorsal Slit Older Than 28 Days   NON-GU PSH: Back surgery Cervical Laminoplasty - 2020     GU PMH: Areflexic bladder, He is doing well with the foley and had no more difficulty with exchanges. - 03/19/2021, - 11/16/2020, - 11/16/2020, - 09/14/2020 (Stable), - 2020 Bulbar urethral stricture - 03/19/2021, - 11/16/2020, - 11/16/2020 Renal calculus (Stable), He has had interval growth of the RLP stone and needs a CT for further assessment as he would be a difficult patient to do a PCNL on with his size. I would want to treat the stone with uretersocopy or ESWL if possible. - 03/19/2021, Nephrolithiasis, - 2015 Urinary Retention, He is tolerating  the foley well but is not making much progress with rehab and remains morbidly obese. He will continue monthly foley changes and I will have him return in 6 months for cystoscopy. - 09/14/2020, - 06/29/2020, He is tolerating the foley but had some bleeding requiring an ER visit in October. The urine is clear now. , - 2021, - 2020  (Stable), He has an areflexic bladder with good capacity and no instability but some loss of compliance. I discussed the options for management including continued catheter drainage or CIC which I recommend. I am going to have him return for CIC instruction and he will need a renal US and consideration of repeat urodynamics in about 6 months. , - 2020, He has post op urinary retention that appears to be secondary to SCI related to a spinal abscess. He will continue with foley drainage for now. He may be getting cervical surgery by Dr. Jordan Likes. He will need urodynamics when he is done with his surgeries. He will f/u in 6 weeks and I gave him an order for [facility] to change his foley monthly. He will need a Hoyar lift for UDS. , - 2019 Disorder of Penis Ot, Penile edema has resolved. - 08/01/2020, He has penile edema, - 06/29/2020 Unihibited neuropathic bladder - 08/01/2020, - 01/06/2020 Incomplete bladder emptying, He has transitioned from the spinal shock phase to having detrusor instability with pressures that don't suggest EDSD but EMG tracing was not obtained. He did void but left a significant PVR. His DLPP was acceptable. He is gaining leg strength and I discussed options including another attempt at CIC with consideration of an anticholinergic or myrbetriq if he has leakage between caths, a suprapubic tube or continued foley drainage. He will continue with the foley catheter to given him more time to rehab and will return in 6 months for a voiding trial if he is able to stand and ambulate sufficiently. - 01/06/2020 History of urolithiasis, No stones seen on renal US. - 2021 Personal Hx Urinary Tract Infections - 2020 Urinary Retention, Unspec - 2019 Ureteral calculus, Calculus of left ureter - 2015 Urinary Tract Inf, Unspec site, Pyuria - 2015 Hydronephrosis Unspec, Hydronephrosis - 2015 ED due to arterial insufficiency, Erectile dysfunction due to arterial insufficiency - 2014 Phimosis, Phimosis - 2014       PMH Notes:  2009-01-31 08:26:51 - Note: Arthritis   NON-GU PMH: Obesity, I discussed the importance of weight loss and the need to avoid sweets and starches. I recommended he seek out a nutritionist as well. With his weight where it is, he is not a candidate for a suprapubic tube or CIC. - 09/14/2020 Infection and inflammatory reaction due to other urinary catheter, subsequent encounter, He had some bleeding with a recent catheter change but that has resolved. I will get a culture from the new cath today. - 2021 Pressure ulcer of unspecified buttock, unspecified stage, His aide has reported a buttocks ulcer and I will get him set up for a wound center consultation. - 2021 Encounter for general adult medical examination without abnormal findings, Encounter for preventive health examination - 2015 Personal history of other diseases of the digestive system, History of gastroesophageal reflux (GERD) - 2015 Personal history of other diseases of the circulatory system, History of hypertension - 2014 Personal history of other diseases of the nervous system and sense organs, History of sleep apnea - 2014 Personal history of other endocrine, nutritional and metabolic disease, History of hypercholesterolemia - 2014 Personal history of other mental  and behavioral disorders, History of depression - 2014 Arthritis GERD    FAMILY HISTORY: Death of family member - Runs In Family Diabetes - Mother   SOCIAL HISTORY: Marital Status: Married Preferred Language: English; Race: Black or African American Current Smoking Status: Patient has never smoked.   Tobacco Use Assessment Completed: Used Tobacco in last 30 days? Drinks 3 caffeinated drinks per day.     Notes: Alcohol use, Never a smoker, Married, Caffeine use, Unemployed, Number of children   REVIEW OF SYSTEMS:    GU Review Male:   Patient denies frequent urination, hard to postpone urination, burning/ pain with urination, get up at night to urinate,  leakage of urine, stream starts and stops, trouble starting your stream, have to strain to urinate , erection problems, and penile pain.  Gastrointestinal (Upper):   Patient denies nausea, vomiting, and indigestion/ heartburn.  Gastrointestinal (Lower):   Patient denies diarrhea and constipation.  Constitutional:   Patient denies fever, night sweats, weight loss, and fatigue.  Skin:   Patient denies itching and skin rash/ lesion.  Eyes:   Patient denies blurred vision and double vision.  Ears/ Nose/ Throat:   Patient denies sore throat and sinus problems.  Hematologic/Lymphatic:   Patient denies swollen glands and easy bruising.  Cardiovascular:   Patient denies leg swelling and chest pains.  Respiratory:   Patient denies cough and shortness of breath.  Endocrine:   Patient denies excessive thirst.  Musculoskeletal:   Patient denies back pain and joint pain.  Neurological:   Patient denies headaches and dizziness.  Psychologic:   Patient denies depression and anxiety.   VITAL SIGNS:      05/01/2021 01:34 PM  BP 178/92 mmHg  Heart Rate 55 /min  Temperature 97.7 F / 36.5 C   GU PHYSICAL EXAMINATION:    Urethral Meatus: Distal shaft hypospadias . Normal size. No lesion, no wart, no polyp, no balanitis, no discharge.   Penis: Penile foley catheter present draining grossly clear-yellow urine.   MULTI-SYSTEM PHYSICAL EXAMINATION:    Constitutional: Obese. Moderate physical deformities. Normally developed. Good grooming. Pt in motorized wheelchair.   Neck: Neck symmetrical, not swollen. Normal tracheal position.  Respiratory: No labored breathing, no use of accessory muscles.   Cardiovascular: Normal temperature, normal extremity pulses, no swelling, no varicosities.  Skin: No paleness, no jaundice, no cyanosis. No lesion, no ulcer, no rash.  Neurologic / Psychiatric: Oriented to time, oriented to place, oriented to person. No depression, no anxiety, no agitation.  Gastrointestinal: Obese  abdomen. No mass, no tenderness, no rigidity.   Eyes: Normal conjunctivae. Normal eyelids.  Musculoskeletal: Wheelchair-bound with limited mobility.     Complexity of Data:  Source Of History:  Patient, Family/Caregiver, Medical Record Summary  Records Review:   Previous Doctor Records, Previous Hospital Records, Previous Patient Records  Urine Test Review:   Urinalysis, Urine Culture  X-Ray Review: KUB: Reviewed Films. Reviewed Report. CLINICAL DATA: History of kidney stone EXAM: ABDOMEN - 1 VIEW COMPARISON: 12/13/2012, 03/27/2021 CT FINDINGS: Limited by habitus and bowel gas. Small catheter over the pelvis. Possible 7 mm calcification to the right of L3 which may correspond to previously demonstrated ureteral stone. There is moderate diffuse gaseous dilatation of the bowel IMPRESSION: 1. Possible 7 mm right paraspinal calcification at L3 level which may correspond to ureteral stone demonstrated on prior CT 2. Moderate diffuse gaseous dilatation of the bowel suggestive of ileus. Electronically Signed By: Jasmine PangKim Fujinaga M.D. On: 04/17/2021 21:57 C.T. Abdomen/Pelvis: Reviewed Films. Reviewed Report.  Notes:                     CLINICAL DATA: Renal calculus, chronic catheterization   EXAM:  CT ABDOMEN AND PELVIS WITHOUT CONTRAST   TECHNIQUE:  Multidetector CT imaging of the abdomen and pelvis was performed  following the standard protocol without IV contrast.   COMPARISON: 03/21/2016   FINDINGS:  Lower chest: No acute abnormality. Bandlike scarring and or  atelectasis of the included bilateral lung bases.   Hepatobiliary: No focal liver abnormality is seen. Status post  cholecystectomy. No biliary dilatation.   Pancreas: Unremarkable. No pancreatic ductal dilatation or  surrounding inflammatory changes.   Spleen: Normal in size without significant abnormality.   Adrenals/Urinary Tract: Adrenal glands are unremarkable. There is an  elongated, 9 mm calculus in the proximal third of  the right ureter  (series 5, image 80). No hydronephrosis. Multiple additional small  nonobstructive right renal calculi. No left-sided calculi.  Thickening of the urinary bladder, which is decompressed by a Foley  catheter.   Stomach/Bowel: Status post partial sleeve gastrectomy. Appendix  appears normal. No evidence of bowel wall thickening, distention, or  inflammatory changes.   Vascular/Lymphatic: Aortic atherosclerosis. No enlarged abdominal or  pelvic lymph nodes.   Reproductive: No mass or other significant abnormality.   Other: No abdominal wall hernia or abnormality. No abdominopelvic  ascites. Fat stranding of the central small bowel mesentery (series  2, image 48)   Musculoskeletal: No acute or significant osseous findings.   IMPRESSION:  1. There is an elongated, 9 mm calculus in the proximal third of the  right ureter. No hydronephrosis.  2. Multiple additional small nonobstructive right renal calculi. No  left-sided calculi.  3. Thickening of the urinary bladder, which is decompressed by a  Foley catheter.  4. Fat stranding of the central small bowel mesentery new compared  to prior examination, nonspecific and infectious or inflammatory.   Aortic Atherosclerosis (ICD10-I70.0).    Electronically Signed  By: Lauralyn Primes M.D.  On: 03/28/2021 11:59   PROCEDURES:         Simple Foley Indwelling Cath Change - 51702  The patient's indwelling foley tube was carefully removed. A 18 French Foley SILICONE catheter was inserted into the bladder using sterile technique. 20cc balloon. The patient was taught routine catheter care. Hand irrigation of the bladder with sterile water was performed. A leg bag was connected. 30 cc of urine was obtained. A urine culture was sent to the lab.   ASSESSMENT:      ICD-10 Details  1 GU:   Areflexic bladder - N31.2 Chronic, Stable  2   Ureteral calculus - N20.1 Right, Acute, Threat to Bodily Function   PLAN:           Orders Labs  Urine Culture CATH          Schedule Return Visit/Planned Activity: Keep Scheduled Appointment - Follow up MD, Schedule Surgery          Document Letter(s):  Created for Patient: Clinical Summary         Notes:   Foley catheter exchange today, a urine specimen will be collected from the new catheter and sent for culture/sensitivity to serve as baseline testing for his upcoming procedure. All questions answered to the best of my ability regarding his upcoming ureteroscopy and expected postoperative course with understanding expressed by the patient's family member present today. He will keep his scheduled follow-up procedure date on 08/02 with his urologist.  Next Appointment:      Next Appointment: 05/15/2021 07:30 AM    Appointment Type: Surgery     Location: [facility] [phone]    Provider: Bjorn Pippin, M.D.    Reason for Visit: OP WL CYSTO RT RTG URS HLL STENT    Preop Cx:  Mx species.   Doxycycline sent to be begin 3 days preop.

## 2021-05-14 NOTE — Anesthesia Preprocedure Evaluation (Addendum)
Anesthesia Evaluation  Patient identified by MRN, date of birth, ID band Patient awake    Reviewed: Allergy & Precautions, H&P , NPO status , Patient's Chart, lab work & pertinent test results  Airway Mallampati: III  TM Distance: >3 FB Neck ROM: Full    Dental no notable dental hx. (+) Teeth Intact, Dental Advisory Given   Pulmonary sleep apnea , former smoker,    Pulmonary exam normal breath sounds clear to auscultation       Cardiovascular Exercise Tolerance: Good hypertension,  Rhythm:Regular Rate:Normal     Neuro/Psych negative neurological ROS  negative psych ROS   GI/Hepatic Neg liver ROS, GERD  Medicated,  Endo/Other  Morbid obesity  Renal/GU Renal disease  negative genitourinary   Musculoskeletal  (+) Arthritis , Osteoarthritis,    Abdominal   Peds  Hematology negative hematology ROS (+) Blood dyscrasia, anemia ,   Anesthesia Other Findings   Reproductive/Obstetrics negative OB ROS                            Anesthesia Physical Anesthesia Plan  ASA: 3  Anesthesia Plan: General   Post-op Pain Management:    Induction: Intravenous  PONV Risk Score and Plan: 3 and Ondansetron, Dexamethasone and Midazolam  Airway Management Planned: LMA  Additional Equipment:   Intra-op Plan:   Post-operative Plan: Extubation in OR  Informed Consent: I have reviewed the patients History and Physical, chart, labs and discussed the procedure including the risks, benefits and alternatives for the proposed anesthesia with the patient or authorized representative who has indicated his/her understanding and acceptance.     Dental advisory given  Plan Discussed with: CRNA  Anesthesia Plan Comments:        Anesthesia Quick Evaluation

## 2021-05-15 ENCOUNTER — Ambulatory Visit (HOSPITAL_COMMUNITY): Payer: No Typology Code available for payment source | Admitting: Anesthesiology

## 2021-05-15 ENCOUNTER — Ambulatory Visit (HOSPITAL_COMMUNITY): Payer: No Typology Code available for payment source

## 2021-05-15 ENCOUNTER — Encounter (HOSPITAL_COMMUNITY): Payer: Self-pay | Admitting: Urology

## 2021-05-15 ENCOUNTER — Encounter (HOSPITAL_COMMUNITY)
Admission: RE | Disposition: A | Discharge status: Home / Self Care | Payer: Self-pay | Source: Home / Self Care | Attending: Urology

## 2021-05-15 ENCOUNTER — Ambulatory Visit (HOSPITAL_COMMUNITY)
Admission: RE | Admit: 2021-05-15 | Discharge: 2021-05-15 | Disposition: A | Discharge status: Home / Self Care | Payer: No Typology Code available for payment source | Attending: Urology | Admitting: Urology

## 2021-05-15 DIAGNOSIS — N202 Calculus of kidney with calculus of ureter: Secondary | ICD-10-CM | POA: Insufficient documentation

## 2021-05-15 DIAGNOSIS — Z993 Dependence on wheelchair: Secondary | ICD-10-CM | POA: Insufficient documentation

## 2021-05-15 DIAGNOSIS — Z880 Allergy status to penicillin: Secondary | ICD-10-CM | POA: Insufficient documentation

## 2021-05-15 HISTORY — PX: CYSTOSCOPY/URETEROSCOPY/HOLMIUM LASER/STENT PLACEMENT: SHX6546

## 2021-05-15 SURGERY — CYSTOSCOPY/URETEROSCOPY/HOLMIUM LASER/STENT PLACEMENT
Anesthesia: General | Site: Ureter | Laterality: Right

## 2021-05-15 MED ORDER — LIDOCAINE 2% (20 MG/ML) 5 ML SYRINGE
INTRAMUSCULAR | Status: AC
  Filled 2021-05-15: qty 5

## 2021-05-15 MED ORDER — PHENYLEPHRINE 40 MCG/ML (10ML) SYRINGE FOR IV PUSH (FOR BLOOD PRESSURE SUPPORT)
PREFILLED_SYRINGE | INTRAVENOUS | Status: AC
  Filled 2021-05-15: qty 10

## 2021-05-15 MED ORDER — MIDAZOLAM HCL 2 MG/2ML IJ SOLN
INTRAMUSCULAR | Status: DC | PRN
  Administered 2021-05-15: 2 mg via INTRAVENOUS

## 2021-05-15 MED ORDER — ORAL CARE MOUTH RINSE: 15.0000 mL | Freq: Once | OROMUCOSAL | Status: AC

## 2021-05-15 MED ORDER — FENTANYL CITRATE (PF) 100 MCG/2ML IJ SOLN
INTRAMUSCULAR | Status: AC
  Filled 2021-05-15: qty 2

## 2021-05-15 MED ORDER — MIDAZOLAM HCL 2 MG/2ML IJ SOLN
INTRAMUSCULAR | Status: AC
  Filled 2021-05-15: qty 2

## 2021-05-15 MED ORDER — SODIUM CHLORIDE 0.9% FLUSH: 3.0000 mL | Freq: Two times a day (BID) | INTRAVENOUS | Status: DC

## 2021-05-15 MED ORDER — CHLORHEXIDINE GLUCONATE 0.12 % MT SOLN
15.0000 mL | Freq: Once | OROMUCOSAL | Status: AC
  Administered 2021-05-15: 15 mL via OROMUCOSAL

## 2021-05-15 MED ORDER — DEXAMETHASONE SODIUM PHOSPHATE 10 MG/ML IJ SOLN
INTRAMUSCULAR | Status: DC | PRN
  Administered 2021-05-15: 10 mg via INTRAVENOUS

## 2021-05-15 MED ORDER — ROCURONIUM BROMIDE 10 MG/ML (PF) SYRINGE
PREFILLED_SYRINGE | INTRAVENOUS | Status: AC
  Filled 2021-05-15: qty 10

## 2021-05-15 MED ORDER — ACETAMINOPHEN 325 MG PO TABS: 650.0000 mg | ORAL_TABLET | ORAL | Status: DC | PRN

## 2021-05-15 MED ORDER — LACTATED RINGERS IV SOLN: INTRAVENOUS | Status: DC

## 2021-05-15 MED ORDER — SODIUM CHLORIDE 0.9 % IR SOLN
Status: DC | PRN
  Administered 2021-05-15: 6000 mL

## 2021-05-15 MED ORDER — ONDANSETRON HCL 4 MG/2ML IJ SOLN
INTRAMUSCULAR | Status: AC
  Filled 2021-05-15: qty 2

## 2021-05-15 MED ORDER — LIDOCAINE 2% (20 MG/ML) 5 ML SYRINGE
INTRAMUSCULAR | Status: DC | PRN
  Administered 2021-05-15: 60 mg via INTRAVENOUS

## 2021-05-15 MED ORDER — PROPOFOL 10 MG/ML IV BOLUS
INTRAVENOUS | Status: DC | PRN
  Administered 2021-05-15: 200 mg via INTRAVENOUS

## 2021-05-15 MED ORDER — ACETAMINOPHEN 500 MG PO TABS
1000.0000 mg | ORAL_TABLET | Freq: Once | ORAL | Status: AC
  Administered 2021-05-15: 1000 mg via ORAL
  Filled 2021-05-15: qty 2

## 2021-05-15 MED ORDER — EPHEDRINE SULFATE-NACL 50-0.9 MG/10ML-% IV SOSY
PREFILLED_SYRINGE | INTRAVENOUS | Status: DC | PRN
  Administered 2021-05-15 (×3): 5 mg via INTRAVENOUS

## 2021-05-15 MED ORDER — FENTANYL CITRATE (PF) 100 MCG/2ML IJ SOLN
INTRAMUSCULAR | Status: DC | PRN
  Administered 2021-05-15: 100 ug via INTRAVENOUS

## 2021-05-15 MED ORDER — ACETAMINOPHEN 650 MG RE SUPP
650.0000 mg | RECTAL | Status: DC | PRN
  Filled 2021-05-15: qty 1

## 2021-05-15 MED ORDER — IOHEXOL 300 MG/ML  SOLN
INTRAMUSCULAR | Status: DC | PRN
  Administered 2021-05-15: 10 mL

## 2021-05-15 MED ORDER — PROPOFOL 10 MG/ML IV BOLUS
INTRAVENOUS | Status: AC
  Filled 2021-05-15: qty 40

## 2021-05-15 MED ORDER — PHENYLEPHRINE 40 MCG/ML (10ML) SYRINGE FOR IV PUSH (FOR BLOOD PRESSURE SUPPORT)
PREFILLED_SYRINGE | INTRAVENOUS | Status: DC | PRN
  Administered 2021-05-15: 80 ug via INTRAVENOUS

## 2021-05-15 MED ORDER — HYDROCODONE-ACETAMINOPHEN 5-325 MG PO TABS: 1.0000 | ORAL_TABLET | ORAL | 0 refills | Status: DC | PRN

## 2021-05-15 MED ORDER — ONDANSETRON HCL 4 MG/2ML IJ SOLN
INTRAMUSCULAR | Status: DC | PRN
  Administered 2021-05-15: 4 mg via INTRAVENOUS

## 2021-05-15 MED ORDER — DEXAMETHASONE SODIUM PHOSPHATE 10 MG/ML IJ SOLN
INTRAMUSCULAR | Status: AC
  Filled 2021-05-15: qty 1

## 2021-05-15 MED ORDER — CIPROFLOXACIN IN D5W 400 MG/200ML IV SOLN
400.0000 mg | INTRAVENOUS | Status: AC
  Administered 2021-05-15: 400 mg via INTRAVENOUS
  Filled 2021-05-15: qty 200

## 2021-05-15 MED ORDER — OXYCODONE HCL 5 MG PO TABS: 5.0000 mg | ORAL_TABLET | ORAL | Status: DC | PRN

## 2021-05-15 MED ORDER — HYDROMORPHONE HCL 1 MG/ML IJ SOLN: 0.2500 mg | INTRAMUSCULAR | Status: DC | PRN

## 2021-05-15 SURGICAL SUPPLY — 28 items
BAG URINE DRAIN 2000ML AR STRL (UROLOGICAL SUPPLIES) ×2 IMPLANT
BAG URO CATCHER STRL LF (MISCELLANEOUS) ×2 IMPLANT
BASKET STONE NCOMPASS (UROLOGICAL SUPPLIES) IMPLANT
CATH SILICONE 5CC 18FR (INSTRUMENTS) ×2 IMPLANT
CATH URET 5FR 28IN OPEN ENDED (CATHETERS) IMPLANT
CATH URET DUAL LUMEN 6-10FR 50 (CATHETERS) IMPLANT
CLOTH BEACON ORANGE TIMEOUT ST (SAFETY) ×2 IMPLANT
EXTRACTOR STONE NITINOL NGAGE (UROLOGICAL SUPPLIES) ×2 IMPLANT
GLOVE SURG POLYISO LF SZ8 (GLOVE) ×2 IMPLANT
GOWN STRL REUS W/TWL XL LVL3 (GOWN DISPOSABLE) ×2 IMPLANT
GUIDEWIRE ANG ZIPWIRE 038X150 (WIRE) ×2 IMPLANT
GUIDEWIRE STR DUAL SENSOR (WIRE) ×2 IMPLANT
HOLDER FOLEY CATH W/STRAP (MISCELLANEOUS) ×2 IMPLANT
IV NS IRRIG 3000ML ARTHROMATIC (IV SOLUTION) ×2 IMPLANT
KIT TURNOVER KIT A (KITS) ×2 IMPLANT
LASER FIB FLEXIVA PULSE ID 365 (Laser) IMPLANT
LASER FIB FLEXIVA PULSE ID 550 (Laser) IMPLANT
LASER FIB FLEXIVA PULSE ID 910 (Laser) IMPLANT
MANIFOLD NEPTUNE II (INSTRUMENTS) ×2 IMPLANT
PACK CYSTO (CUSTOM PROCEDURE TRAY) ×2 IMPLANT
SHEATH URETERAL 12FR 45CM (SHEATH) ×2 IMPLANT
SHEATH URETERAL 12FRX35CM (MISCELLANEOUS) ×2 IMPLANT
STENT CONTOUR 6FRX26X.038 (STENTS) ×2 IMPLANT
SYR CONTROL 10ML LL (SYRINGE) ×2 IMPLANT
TRACTIP FLEXIVA PULS ID 200XHI (Laser) ×1 IMPLANT
TRACTIP FLEXIVA PULSE ID 200 (Laser) ×1
TUBING CONNECTING 10 (TUBING) ×2 IMPLANT
TUBING UROLOGY SET (TUBING) ×2 IMPLANT

## 2021-05-15 NOTE — Anesthesia Procedure Notes (Signed)
Procedure Name: LMA Insertion Date/Time: 05/15/2021 7:35 AM Performed by: Donna Bernard, CRNA Pre-anesthesia Checklist: Patient identified, Emergency Drugs available, Suction available, Patient being monitored and Timeout performed Patient Re-evaluated:Patient Re-evaluated prior to induction Oxygen Delivery Method: Circle system utilized Preoxygenation: Pre-oxygenation with 100% oxygen Induction Type: IV induction Ventilation: Mask ventilation without difficulty LMA: LMA inserted LMA Size: 5.0 Number of attempts: 1 Tube secured with: Tape

## 2021-05-15 NOTE — Anesthesia Postprocedure Evaluation (Signed)
Anesthesia Post Note  Patient: Jerald Hennington  Procedure(s) Performed: CYSTOSCOPY RIGHT RETROGRADE RIGHT URETEROSCOPY/HOLMIUM LASER/STENT PLACEMENT (Right: Ureter)     Patient location during evaluation: PACU Anesthesia Type: General Level of consciousness: awake and alert Pain management: pain level controlled Vital Signs Assessment: post-procedure vital signs reviewed and stable Respiratory status: spontaneous breathing, nonlabored ventilation and respiratory function stable Cardiovascular status: blood pressure returned to baseline and stable Postop Assessment: no apparent nausea or vomiting Anesthetic complications: no   No notable events documented.  Last Vitals:  Vitals:   05/15/21 0930 05/15/21 0945  BP: (!) 162/96 (!) 163/96  Pulse: (!) 54 (!) 55  Resp: 12 16  Temp:  36.5 C  SpO2: 93% 94%    Last Pain:  Vitals:   05/15/21 0945  PainSc: 0-No pain                 Tniyah Nakagawa,W. EDMOND

## 2021-05-15 NOTE — Interval H&P Note (Signed)
History and Physical Interval Note:  05/15/2021 7:03 AM  Scot Jun  has presented today for surgery, with the diagnosis of RIGHT PROXIMAL STONE.  The various methods of treatment have been discussed with the patient and family. After consideration of risks, benefits and other options for treatment, the patient has consented to  Procedure(s): CYSTOSCOPY RIGHT RETROGRADE RIGHT URETEROSCOPY/HOLMIUM LASER/STENT PLACEMENT (Right) as a surgical intervention.  The patient's history has been reviewed, patient examined, no change in status, stable for surgery.  I have reviewed the patient's chart and labs.  Questions were answered to the patient's satisfaction.     Bjorn Pippin

## 2021-05-15 NOTE — Discharge Instructions (Addendum)
You may remove the ureteral stent by pulling the attached string on 05/22/21 if you feel comfortable doing this, if you don't please call the office and we can get you in to have it done.   Please resume the doxcycline.

## 2021-05-15 NOTE — Transfer of Care (Signed)
Immediate Anesthesia Transfer of Care Note  Patient: [name] [name]  Procedure(s) Performed: CYSTOSCOPY RIGHT RETROGRADE RIGHT URETEROSCOPY/HOLMIUM LASER/STENT PLACEMENT (Right: Ureter)  Patient Location: PACU  Anesthesia Type:General  Level of Consciousness: awake, oriented and patient cooperative  Airway & Oxygen Therapy: Patient Spontanous Breathing and Patient connected to face mask oxygen  Post-op Assessment: Report given to RN and Post -op Vital signs reviewed and stable  Post vital signs: stable  Last Vitals:  Vitals Value Taken Time  BP 162/91 05/15/21 0915  Temp    Pulse 52 05/15/21 0916  Resp 20 05/15/21 0916  SpO2 100 % 05/15/21 0916  Vitals shown include unvalidated device data.  Last Pain:  Vitals:   05/15/21 0606  PainSc: 0-No pain         Complications: No notable events documented.

## 2021-05-15 NOTE — Op Note (Signed)
Procedure: 1.  Cystoscopy with right retrograde pyelogram and interpretation. 2.  Right ureteroscopy with holmium laser application, stone extraction and insertion of right double-J stent. 3.  Application of fluoroscopy.  Preop diagnosis: Right proximal ureteral and renal stones.  Postop diagnosis: Same.  Surgeon: Dr. Bjorn Pippin.  Anesthesia: General.  Drain: 6 French by 26cm contour double-J stent with tether on the right. 18 French silicone Foley catheter.  Specimen: Stone fragments.  EBL: None.  Complications: None..  Indications: The patient is a 65 year old male with chronic Foley catheter drainage who was recently found to have a 7 mm right proximal ureteral stone and small renal stones and it was felt that ureteroscopy was indicated.  Procedure: He been on p.o. antibiotics preoperatively and was given Cipro in the OR.  A general anesthetic was induced.  He was placed in lithotomy position and fitted with PAS hose.  His Foley catheter was removed.  He was prepped with Betadine solution and draped in usual sterile fashion.  He had severe acquired hypospadias from his chronic Foley.  A 21 French cystoscope was passed with a 30 degree lens.  Examination revealed otherwise widely patent urethra with some changes consistent with his chronic catheter.  The external sphincter was patulous.  The prostatic urethra had bilobar hyperplasia without significant obstruction but there was some elevation of bladder neck.  The bladder wall was smooth without lesions.  The ureteral orifices were well away from the bladder neck but otherwise unremarkable.  The right ureteral orifice was cannulated with 5 [place] open-ended catheter and contrast was instilled for a retrograde pyelogram.    Examination revealed some J hooking and mild tortuosity of the ureter with a filling defect in the proximal ureter consistent with the stone and some filling defects in the lower pole consistent with stones there was  mild hydronephrosis.  A sensor wire was then advanced to the kidney under fluoroscopic guidance and a 35 cm digital access sheath 12 French inner core was advanced over the wire into the lower proximal ureter but it was not felt that this was long enough for the needs of the procedure.  So an assembled 45 cm 12/14 French digital access sheath was then passed without difficulty to the level of the stone.  The inner core and wire were removed and the dual-lumen digital flexible scope was passed.  The stone appeared to have been pushed back into the lower pole calyx.  A 200 m tract of laser fiber was then passed through the scope and the stones in the lower pole were then fragmented as needed with the fragments were removed with the engage basket was sufficiently reduced.  Additional stones in the mid and upper pole calyces were identified and removed and at the end of the procedure final inspection revealed only fine sand and small grit.  The ureteroscope was then removed over a wire and the ureter was inspected.  No ureteral injury or residual stones were noted in the ureter.  A 6 French by 26 cm contour double-J stent with tether was then advanced kidney under fluoroscopic guidance.  The wire was removed, leaving good coil in the kidney and a good coil in the bladder.  Cystoscopy was used to confirm the distal stent loop as well.  The stent string was left exiting the urethra.  The string was secured to the patient's penis after a fresh 18 French Foley catheter was inserted.  The balloon was filled with 10 mL of sterile fluid.  He was taken down from lithotomy position, his anesthetic was reversed and he was moved recovery in stable condition.  Stone fragments were given to the family to bring to the office at follow-up.

## 2021-05-16 ENCOUNTER — Encounter (HOSPITAL_COMMUNITY): Payer: Self-pay | Admitting: Urology

## 2021-05-30 ENCOUNTER — Other Ambulatory Visit: Payer: Self-pay | Admitting: Nurse Practitioner

## 2021-05-30 DIAGNOSIS — N132 Hydronephrosis with renal and ureteral calculous obstruction: Secondary | ICD-10-CM

## 2021-06-15 ENCOUNTER — Other Ambulatory Visit (HOSPITAL_COMMUNITY): Payer: Self-pay | Admitting: Nurse Practitioner

## 2021-06-15 DIAGNOSIS — N132 Hydronephrosis with renal and ureteral calculous obstruction: Secondary | ICD-10-CM

## 2021-06-21 ENCOUNTER — Other Ambulatory Visit (HOSPITAL_COMMUNITY): Payer: Commercial Managed Care - PPO

## 2021-06-26 ENCOUNTER — Ambulatory Visit (HOSPITAL_COMMUNITY)
Admission: RE | Admit: 2021-06-26 | Discharge: 2021-06-26 | Disposition: A | Discharge status: Home / Self Care | Payer: No Typology Code available for payment source | Source: Ambulatory Visit | Attending: Nurse Practitioner | Admitting: Nurse Practitioner

## 2021-06-26 ENCOUNTER — Other Ambulatory Visit: Payer: Self-pay

## 2021-06-26 DIAGNOSIS — N132 Hydronephrosis with renal and ureteral calculous obstruction: Secondary | ICD-10-CM | POA: Insufficient documentation

## 2022-09-17 ENCOUNTER — Encounter: Payer: Self-pay | Admitting: Gastroenterology

## 2022-10-14 HISTORY — PX: EYE SURGERY: SHX253

## 2022-10-22 ENCOUNTER — Ambulatory Visit (INDEPENDENT_AMBULATORY_CARE_PROVIDER_SITE_OTHER): Payer: Medicare Other | Admitting: Gastroenterology

## 2022-10-22 ENCOUNTER — Encounter: Payer: Self-pay | Admitting: Gastroenterology

## 2022-10-22 ENCOUNTER — Other Ambulatory Visit (INDEPENDENT_AMBULATORY_CARE_PROVIDER_SITE_OTHER): Payer: Medicare Other

## 2022-10-22 VITALS — HR 65 | Ht 70.0 in | Wt 370.0 lb

## 2022-10-22 DIAGNOSIS — Z1211 Encounter for screening for malignant neoplasm of colon: Secondary | ICD-10-CM | POA: Diagnosis not present

## 2022-10-22 DIAGNOSIS — Z1212 Encounter for screening for malignant neoplasm of rectum: Secondary | ICD-10-CM

## 2022-10-22 DIAGNOSIS — K219 Gastro-esophageal reflux disease without esophagitis: Secondary | ICD-10-CM

## 2022-10-22 DIAGNOSIS — R140 Abdominal distension (gaseous): Secondary | ICD-10-CM

## 2022-10-22 LAB — CBC WITH DIFFERENTIAL/PLATELET
Basophils Absolute: 0 10*3/uL (ref 0.0–0.1)
Basophils Relative: 0.4 % (ref 0.0–3.0)
Eosinophils Absolute: 0.3 10*3/uL (ref 0.0–0.7)
Eosinophils Relative: 4.1 % (ref 0.0–5.0)
HCT: 34.9 % — ABNORMAL LOW (ref 39.0–52.0)
Hemoglobin: 11.3 g/dL — ABNORMAL LOW (ref 13.0–17.0)
Lymphocytes Relative: 21.2 % (ref 12.0–46.0)
Lymphs Abs: 1.5 10*3/uL (ref 0.7–4.0)
MCHC: 32.5 g/dL (ref 30.0–36.0)
MCV: 78.8 fl (ref 78.0–100.0)
Monocytes Absolute: 0.6 10*3/uL (ref 0.1–1.0)
Monocytes Relative: 8.7 % (ref 3.0–12.0)
Neutro Abs: 4.6 10*3/uL (ref 1.4–7.7)
Neutrophils Relative %: 65.6 % (ref 43.0–77.0)
Platelets: 314 10*3/uL (ref 150.0–400.0)
RBC: 4.43 Mil/uL (ref 4.22–5.81)
RDW: 16.9 % — ABNORMAL HIGH (ref 11.5–15.5)
WBC: 7 10*3/uL (ref 4.0–10.5)

## 2022-10-22 MED ORDER — OMEPRAZOLE 40 MG PO CPDR: 40.0000 mg | DELAYED_RELEASE_CAPSULE | Freq: Every day | ORAL | 5 refills | Status: DC

## 2022-10-22 MED ORDER — NA SULFATE-K SULFATE-MG SULF 17.5-3.13-1.6 GM/177ML PO SOLN: 1.0000 | Freq: Once | ORAL | 0 refills | Status: AC

## 2022-10-22 NOTE — Progress Notes (Signed)
10/22/2022 [name] [name] [id number] 09-12-1956   HISTORY OF PRESENT ILLNESS: This is a 67 year old male who is new to our office.  Looks like he has a history with [facility] and he reports having a colonoscopy with them in 2012.  We do not have those records.  He is here today with his wife.  He tells me that he wants to discuss having colonoscopy, but he has also been having some burning upper abdominal pain and what sounds like acid may be refluxing up into his chest.  He says he has been using a lot of Tums and Pepto-Bismol.  Has used Pepcid in the past.  Has never been on a PPI from the sounds of it.  The only medications that he uses on a regular basis is vitamin B12 by mouth and Vicodin as needed.  He  that he has also started an over-the-counter iron once daily on his own accord.  He tells me that he has been told that he was anemic in the past.  He says that these problems with the stomach have been present for about the past 1.5-2.5 months or so.  He says that he was having diarrhea, but that has improved.  Stools still a little bit loose.  He denies any blood in his stools but tells me that they are very dark on a daily basis for about the past 6 weeks.  Once again he has been using Pepto-Bismol regularly.  Past Medical History:  Diagnosis Date   Anemia    Arthritis    Chronic kidney disease    HX acute kidney failure / acute pyelonephritis / hydronephrosis / severe sepsis per discharge summary 12/27/13   GERD (gastroesophageal reflux disease)    History of kidney stones    Hypertension    Morbid obesity ([place])    Obstructive sleep apnea    does not need c pap since 110 lb wt loss   Osteoporosis    Prurigo 2002   Scars    ON ARMS FROM CHEMICAL EXPLOSION 1999   Spinal stenosis    Past Surgical History:  Procedure Laterality Date   ANTERIOR CERVICAL DECOMP/DISCECTOMY FUSION N/A 12/07/2018   Procedure: ANTERIOR CERVICAL DECOMPRESSION FUSION - CERVICAL THREE-CERVICAL FOUR, CERVICAL  FOUR-CERVICAL FIVE, CERVICAL FIVE-CERVICAL SIX;  Surgeon: Earnie Larsson, MD;  Location: [place];  Service: Neurosurgery;  Laterality: N/A;   BACK SURGERY  01/23/2010   lumbar   CHOLECYSTECTOMY N/A 03/24/2016   Procedure: LAPAROSCOPIC CHOLECYSTECTOMY WITH INTRAOPERATIVE CHOLANGIOGRAM;  Surgeon: Autumn Messing III, MD;  [facility];  Service: General;  Laterality: N/A;   CIRCUMCISION     CYSTOSCOPY WITH RETROGRADE PYELOGRAM, URETEROSCOPY AND STENT PLACEMENT Left 01/20/2014   Procedure: CYSTOSCOPY WITH RETROGRADE PYELOGRAM, URETEROSCOPY AND STENT EXCHANGE;  Surgeon: Molli Hazard, MD;  [facility];  Service: Urology;  Laterality: Left;  bugbee bladder fulguration   CYSTOSCOPY WITH STENT PLACEMENT Left 12/28/2013   Procedure: CYSTOSCOPY WITH STENT PLACEMENT left retrograde pyleogram;  Surgeon: Molli Hazard, MD;  [facility];  Service: Urology;  Laterality: Left;   CYSTOSCOPY/URETEROSCOPY/HOLMIUM LASER/STENT PLACEMENT Right 05/15/2021   Procedure: CYSTOSCOPY RIGHT RETROGRADE RIGHT URETEROSCOPY/HOLMIUM LASER/STENT PLACEMENT;  Surgeon: Irine Seal, MD;  [facility];  Service: Urology;  Laterality: Right;   ESOPHAGOGASTRODUODENOSCOPY N/A 12/07/2012   Procedure: ESOPHAGOGASTRODUODENOSCOPY (EGD);  Surgeon: Madilyn Hook, DO;  [facility];  Service: General;  Laterality: N/A;   HOLMIUM LASER APPLICATION Left 0/11/7739   Procedure: HOLMIUM LASER APPLICATION;  Surgeon: Milford Cage, MD;  [facility];  Service: Urology;  Laterality: Left;   KNEE ARTHROSCOPY Left 06/29/2018   Procedure: ARTHROSCOPY KNEE;  Surgeon: Kerrin Champagne, MD;  Location: [facility];  Service: Orthopedics;  Laterality: Left;   LAPAROSCOPIC GASTRIC SLEEVE RESECTION N/A 12/07/2012   Procedure: LAPAROSCOPIC GASTRIC SLEEVE RESECTION;  Surgeon: Lodema Pilot, DO;  [facility];  Service: General;  Laterality: N/A;  laparoscopic sleeve gastrectomy with EGD   LUMBAR LAMINECTOMY N/A 11/17/2017   Procedure: L2-3  LAMINECTOMY AND REDO LAMINECTOMIES  L3-4, L4-5 AND L5-S1;  Surgeon: Kerrin Champagne, MD;  Location: [facility];  Service: Orthopedics;  Laterality: N/A;   LUMBAR WOUND DEBRIDEMENT N/A 06/29/2018   Procedure: LUMBAR WOUND DEBRIDEMENT DRAINAGE AND IRRIGATION; AND ASPIRATION OF LEFT KNEE;  Surgeon: Kerrin Champagne, MD;  Location: [facility];  Service: Orthopedics;  Laterality: N/A;   TEE WITHOUT CARDIOVERSION N/A 07/01/2018   Procedure: TRANSESOPHAGEAL ECHOCARDIOGRAM (TEE);  Surgeon: Laurey Morale, MD;  Location: [facility];  Service: Cardiovascular;  Laterality: N/A;    reports that he quit smoking about 48 years ago. His smoking use included cigarettes. He has never used smokeless tobacco. He reports that he does not drink alcohol and does not use drugs. family history includes Diabetes in his mother; Heart disease in his father. Allergies  Allergen Reactions   Unasyn [Ampicillin-Sulbactam Sodium] Hives, Itching, Swelling and Rash      Outpatient Encounter Medications as of 10/22/2022  Medication Sig   Cyanocobalamin (B-12 PO) Take 1 tablet by mouth daily.   HYDROcodone-acetaminophen (NORCO/VICODIN) 5-325 MG tablet Take 1 tablet by mouth every 4 (four) hours as needed for moderate pain.   No facility-administered encounter medications on file as of 10/22/2022.     REVIEW OF SYSTEMS  : All other systems reviewed and negative except where noted in the History of Present Illness.   PHYSICAL EXAM: Pulse 65   Ht 5\' 10"  (1.778 m) Comment: verbal  Wt (!) 370 lb (167.8 kg) Comment: verbal  BMI 53.09 kg/m  General: Well developed AA male in no acute distress; in motorized chair Head: Normocephalic and atraumatic Eyes:  Sclerae anicteric, conjunctiva pink. Ears: Normal auditory acuity. Lungs: Clear throughout to auscultation; no W/R/R. Heart: Regular rate and rhythm; no M/R/G. Abdomen: Firm and obese.  BS present.  Non-tender. Rectal:  Will be done at the time of colonoscopy. Musculoskeletal:  Symmetrical with no gross deformities  Skin: No lesions on visible extremities Extremities: B/L LE edema noted. Neurological: Alert oriented x 4, grossly non-focal Psychological:  Alert and cooperative. Normal mood and affect  ASSESSMENT AND PLAN: *Epigastric abdominal pain/GERD: Describes an upper abdominal burning sensation and what sounds like reflux.  Has been using Pepto-Bismol and Tums.  Will start omeprazole 40 mg once daily.  Prescription sent to pharmacy.  Will plan for EGD with Dr. at [facility] hospital due to BMI.  Also reports dark stools but has been using a lot of Pepto-Bismol.  Will check a CBC today. *CRC screening:  Reports colonoscopy in 2012 at [facility].  Will schedule for colonoscopy with [facility] at [facility] hospital due to BMI >50. *Abdominal distention: Question fat versus ascites.  Will plan for abdominal ultrasound to rule out ascites.   CC:  [facility], MD

## 2022-10-22 NOTE — Patient Instructions (Addendum)
_______________________________________________________  If you are age 65 or older, your body mass index should be between 23-30. Your Body mass index is 53.09 kg/m. If this is out of the aforementioned range listed, please consider follow up with your Primary Care Provider.  If you are age 19 or younger, your body mass index should be between 19-25. Your Body mass index is 53.09 kg/m. If this is out of the aformentioned range listed, please consider follow up with your Primary Care Provider.   ________________________________________________________  The [facility] providers would like to encourage you to use [facility] to communicate with providers for non-urgent requests or questions.  Due to long hold times on the telephone, sending your provider a message by [facility] may be a faster and more efficient way to get a response.  Please allow 48 business hours for a response.  Please remember that this is for non-urgent requests.  _______________________________________________________  Your provider has requested that you go to the basement level for lab work before leaving today. Press "B" on the elevator. The lab is located at the first door on the left as you exit the elevator.  You have been scheduled for an endoscopy and colonoscopy. Please follow the written instructions given to you at your visit today. Please pick up your prep supplies at the pharmacy within the next 1-3 days. If you use inhalers (even only as needed), please bring them with you on the day of your procedure.  We have sent the following medications to your pharmacy for you to pick up at your convenience: Suprep Omeprazole  It was a pleasure to see you today!  Thank you for trusting me with your gastrointestinal care!

## 2022-10-23 ENCOUNTER — Telehealth: Payer: Self-pay

## 2022-10-23 NOTE — Telephone Encounter (Signed)
Patient aware of [place] at [facility] on 10-29-2022 at 930am arrival time. NPO after midnight and he has the number to call to reschedule   You have been scheduled for an abdominal ultrasound at [facility] (1st floor of hospital) on 10-29-2022 at 10am. Please arrive 30 minutes prior to your appointment for registration. Make certain not to have anything to eat or drink midnight prior to your appointment. Should you need to reschedule your appointment, please contact radiology at [phone]. This test typically takes about 30 minutes to perform.

## 2022-10-24 NOTE — Progress Notes (Signed)
Agree with the assessment and plan as outlined by Jessica Zehr, PA-C. ? ?Wing Gfeller, DO, FACG ? ?

## 2022-10-29 ENCOUNTER — Ambulatory Visit (HOSPITAL_COMMUNITY)
Admission: RE | Admit: 2022-10-29 | Discharge: 2022-10-29 | Disposition: A | Discharge status: Home / Self Care | Payer: Medicare Other | Source: Ambulatory Visit | Attending: Gastroenterology | Admitting: Gastroenterology

## 2022-10-29 DIAGNOSIS — R140 Abdominal distension (gaseous): Secondary | ICD-10-CM | POA: Diagnosis present

## 2022-11-19 ENCOUNTER — Encounter (HOSPITAL_COMMUNITY): Payer: Self-pay | Admitting: Gastroenterology

## 2022-11-25 NOTE — Addendum Note (Signed)
Addended by: Curlene Labrum E on: 11/25/2022 01:41 PM   Modules accepted: Orders

## 2022-11-26 ENCOUNTER — Encounter (HOSPITAL_COMMUNITY): Payer: Self-pay | Admitting: Gastroenterology

## 2022-11-26 ENCOUNTER — Ambulatory Visit (HOSPITAL_COMMUNITY)
Admission: RE | Admit: 2022-11-26 | Discharge: 2022-11-26 | Disposition: A | Discharge status: Home / Self Care | Payer: Medicare Other | Attending: Gastroenterology | Admitting: Gastroenterology

## 2022-11-26 ENCOUNTER — Ambulatory Visit (HOSPITAL_BASED_OUTPATIENT_CLINIC_OR_DEPARTMENT_OTHER): Payer: Medicare Other | Admitting: Anesthesiology

## 2022-11-26 ENCOUNTER — Encounter (HOSPITAL_COMMUNITY)
Admission: RE | Disposition: A | Discharge status: Home / Self Care | Payer: Self-pay | Source: Home / Self Care | Attending: Gastroenterology

## 2022-11-26 ENCOUNTER — Ambulatory Visit (HOSPITAL_COMMUNITY): Payer: Medicare Other | Admitting: Anesthesiology

## 2022-11-26 DIAGNOSIS — R1310 Dysphagia, unspecified: Secondary | ICD-10-CM

## 2022-11-26 DIAGNOSIS — R1013 Epigastric pain: Secondary | ICD-10-CM | POA: Diagnosis present

## 2022-11-26 DIAGNOSIS — Z87891 Personal history of nicotine dependence: Secondary | ICD-10-CM | POA: Diagnosis not present

## 2022-11-26 DIAGNOSIS — Z1211 Encounter for screening for malignant neoplasm of colon: Secondary | ICD-10-CM

## 2022-11-26 DIAGNOSIS — I129 Hypertensive chronic kidney disease with stage 1 through stage 4 chronic kidney disease, or unspecified chronic kidney disease: Secondary | ICD-10-CM | POA: Insufficient documentation

## 2022-11-26 DIAGNOSIS — Z6841 Body Mass Index (BMI) 40.0 and over, adult: Secondary | ICD-10-CM | POA: Insufficient documentation

## 2022-11-26 DIAGNOSIS — N189 Chronic kidney disease, unspecified: Secondary | ICD-10-CM | POA: Diagnosis not present

## 2022-11-26 DIAGNOSIS — I10 Essential (primary) hypertension: Secondary | ICD-10-CM

## 2022-11-26 DIAGNOSIS — K219 Gastro-esophageal reflux disease without esophagitis: Secondary | ICD-10-CM | POA: Insufficient documentation

## 2022-11-26 DIAGNOSIS — R1110 Vomiting, unspecified: Secondary | ICD-10-CM

## 2022-11-26 DIAGNOSIS — K2950 Unspecified chronic gastritis without bleeding: Secondary | ICD-10-CM | POA: Diagnosis not present

## 2022-11-26 DIAGNOSIS — Z9884 Bariatric surgery status: Secondary | ICD-10-CM | POA: Insufficient documentation

## 2022-11-26 DIAGNOSIS — R12 Heartburn: Secondary | ICD-10-CM | POA: Diagnosis not present

## 2022-11-26 DIAGNOSIS — G4733 Obstructive sleep apnea (adult) (pediatric): Secondary | ICD-10-CM | POA: Diagnosis not present

## 2022-11-26 DIAGNOSIS — Z903 Acquired absence of stomach [part of]: Secondary | ICD-10-CM

## 2022-11-26 HISTORY — PX: SAVORY DILATION: SHX5439

## 2022-11-26 HISTORY — PX: ESOPHAGOGASTRODUODENOSCOPY (EGD) WITH PROPOFOL: SHX5813

## 2022-11-26 HISTORY — PX: BIOPSY: SHX5522

## 2022-11-26 SURGERY — ESOPHAGOGASTRODUODENOSCOPY (EGD) WITH PROPOFOL
Anesthesia: Monitor Anesthesia Care

## 2022-11-26 MED ORDER — PROPOFOL 500 MG/50ML IV EMUL
INTRAVENOUS | Status: DC | PRN
  Administered 2022-11-26: 125 ug/kg/min via INTRAVENOUS

## 2022-11-26 MED ORDER — PROPOFOL 10 MG/ML IV BOLUS
INTRAVENOUS | Status: DC | PRN
  Administered 2022-11-26: 25 mg via INTRAVENOUS

## 2022-11-26 MED ORDER — LIDOCAINE 2% (20 MG/ML) 5 ML SYRINGE
INTRAMUSCULAR | Status: DC | PRN
  Administered 2022-11-26: 100 mg via INTRAVENOUS

## 2022-11-26 MED ORDER — LACTATED RINGERS IV SOLN: INTRAVENOUS | Status: DC

## 2022-11-26 MED ORDER — LACTATED RINGERS IV SOLN: INTRAVENOUS | Status: DC | PRN

## 2022-11-26 MED ORDER — SODIUM CHLORIDE 0.9 % IV SOLN: INTRAVENOUS | Status: DC

## 2022-11-26 SURGICAL SUPPLY — 25 items

## 2022-11-26 NOTE — Anesthesia Postprocedure Evaluation (Signed)
Anesthesia Post Note  Patient: [name] [name]  Procedure(s) Performed: ESOPHAGOGASTRODUODENOSCOPY (EGD) WITH PROPOFOL SAVORY DILATION BIOPSY     Patient location during evaluation: PACU Anesthesia Type: MAC Level of consciousness: awake and alert Pain management: pain level controlled Vital Signs Assessment: post-procedure vital signs reviewed and stable Respiratory status: spontaneous breathing, nonlabored ventilation, respiratory function stable and patient connected to nasal cannula oxygen Cardiovascular status: stable and blood pressure returned to baseline Postop Assessment: no apparent nausea or vomiting Anesthetic complications: no  No notable events documented.  Last Vitals:  Vitals:   11/26/22 0801 11/26/22 0909  BP: (!) 147/82 (!) 146/77  Pulse: 77 80  Resp: 15 15  Temp: 37 C 36.7 C  SpO2: 98% 98%    Last Pain:  Vitals:   11/26/22 0909  TempSrc: Temporal  PainSc: 0-No pain                 Jania Steinke S

## 2022-11-26 NOTE — Anesthesia Procedure Notes (Signed)
Procedure Name: MAC Date/Time: 11/26/2022 8:30 AM  Performed by: Randye Lobo, CRNAPre-anesthesia Checklist: Patient identified, Emergency Drugs available, Suction available and Patient being monitored Patient Re-evaluated:Patient Re-evaluated prior to induction Oxygen Delivery Method: Simple face mask Preoxygenation: Pre-oxygenation with 100% oxygen Induction Type: IV induction Airway Equipment and Method: Bite block Placement Confirmation: positive ETCO2 and breath sounds checked- equal and bilateral Dental Injury: Teeth and Oropharynx as per pre-operative assessment

## 2022-11-26 NOTE — Interval H&P Note (Signed)
History and Physical Interval Note:  11/26/2022 8:14 AM  [name] [name]  has presented today for surgery, with the diagnosis of gerd, dysphagia, epigastric pain, dyspepsia, crc screening.  The various methods of treatment have been discussed with the patient and family. After consideration of risks, benefits and other options for treatment, the patient has consented to  Procedure(s): COLONOSCOPY WITH PROPOFOL (N/A) ESOPHAGOGASTRODUODENOSCOPY (EGD) WITH PROPOFOL (N/A) as a surgical intervention.  The patient's history has been reviewed, patient examined, no change in status, stable for surgery.  I have reviewed the patient's chart and labs.  Questions were answered to the patient's satisfaction.     Dominic Pea [name]

## 2022-11-26 NOTE — H&P (Signed)
GASTROENTEROLOGY PROCEDURE H&P NOTE   Primary Care Physician: Seward Carol, MD    Reason for Procedure:  Epigastric pain, dyspepsia, GERD; colon cancer screening  Plan:    EGD, colonoscopy  Patient is appropriate for endoscopic procedure(s) at [facility] unit.  The nature of the procedure, as well as the risks, benefits, and alternatives were carefully and thoroughly reviewed with the patient. Ample time for discussion and questions allowed. The patient understood, was satisfied, and agreed to proceed.     HPI: [name] [name] is a 67 y.o. male who presents for EGD for evaluation of epigastric pain, dyspepsia, and GERD, along with colonoscopy for routine CRC screening.  Was last seen in the office on 10/21/2022.  No changes in clinical history since then.  Procedures scheduled at [facility] Endoscopy unit due to elevated periprocedural risks from underlying comorbidities.  Past Medical History:  Diagnosis Date   Anemia    Arthritis    Chronic kidney disease    HX acute kidney failure / acute pyelonephritis / hydronephrosis / severe sepsis per discharge summary 12/27/13   GERD (gastroesophageal reflux disease)    History of kidney stones    Hypertension    Morbid obesity ([place])    Obstructive sleep apnea    does not need c pap since 110 lb wt loss   Osteoporosis    Prurigo 2002   Scars    ON ARMS FROM CHEMICAL EXPLOSION 1999   Spinal stenosis     Past Surgical History:  Procedure Laterality Date   ANTERIOR CERVICAL DECOMP/DISCECTOMY FUSION N/A 12/07/2018   Procedure: ANTERIOR CERVICAL DECOMPRESSION FUSION - CERVICAL THREE-CERVICAL FOUR, CERVICAL FOUR-CERVICAL FIVE, CERVICAL FIVE-CERVICAL SIX;  Surgeon: Earnie Larsson, MD;  Location: [place];  Service: Neurosurgery;  Laterality: N/A;   BACK SURGERY  01/23/2010   lumbar   CHOLECYSTECTOMY N/A 03/24/2016   Procedure: LAPAROSCOPIC CHOLECYSTECTOMY WITH INTRAOPERATIVE CHOLANGIOGRAM;  Surgeon: Autumn Messing III, MD;  [facility];  Service: General;  Laterality: N/A;   CIRCUMCISION     CYSTOSCOPY WITH RETROGRADE PYELOGRAM, URETEROSCOPY AND STENT PLACEMENT Left 01/20/2014   Procedure: CYSTOSCOPY WITH RETROGRADE PYELOGRAM, URETEROSCOPY AND STENT EXCHANGE;  Surgeon: Molli Hazard, MD;  [facility];  Service: Urology;  Laterality: Left;  bugbee bladder fulguration   CYSTOSCOPY WITH STENT PLACEMENT Left 12/28/2013   Procedure: CYSTOSCOPY WITH STENT PLACEMENT left retrograde pyleogram;  Surgeon: Molli Hazard, MD;  [facility];  Service: Urology;  Laterality: Left;   CYSTOSCOPY/URETEROSCOPY/HOLMIUM LASER/STENT PLACEMENT Right 05/15/2021   Procedure: CYSTOSCOPY RIGHT RETROGRADE RIGHT URETEROSCOPY/HOLMIUM LASER/STENT PLACEMENT;  Surgeon: Irine Seal, MD;  [facility];  Service: Urology;  Laterality: Right;   ESOPHAGOGASTRODUODENOSCOPY N/A 12/07/2012   Procedure: ESOPHAGOGASTRODUODENOSCOPY (EGD);  Surgeon: Madilyn Hook, DO;  [facility];  Service: General;  Laterality: N/A;   HOLMIUM LASER APPLICATION Left [id number]   Procedure: HOLMIUM LASER APPLICATION;  Surgeon: Molli Hazard, MD;  [facility];  Service: Urology;  Laterality: Left;   KNEE ARTHROSCOPY Left 06/29/2018   Procedure: ARTHROSCOPY KNEE;  Surgeon: Jessy Oto, MD;  Location: [place];  Service: Orthopedics;  Laterality: Left;   LAPAROSCOPIC GASTRIC SLEEVE RESECTION N/A 12/07/2012   Procedure: LAPAROSCOPIC GASTRIC SLEEVE RESECTION;  Surgeon: Madilyn Hook, DO;  [facility];  Service: General;  Laterality: N/A;  laparoscopic sleeve gastrectomy with EGD   LUMBAR LAMINECTOMY N/A 11/17/2017   Procedure: L2-3 LAMINECTOMY AND REDO LAMINECTOMIES  L3-4, L4-5 AND L5-S1;  Surgeon: Jessy Oto, MD;  Location: [place];  Service: Orthopedics;  Laterality: N/A;   LUMBAR WOUND DEBRIDEMENT N/A 06/29/2018   Procedure: LUMBAR WOUND DEBRIDEMENT DRAINAGE AND IRRIGATION; AND ASPIRATION OF LEFT KNEE;  Surgeon: Jessy Oto, MD;  Location: [place];  Service: Orthopedics;  Laterality: N/A;   TEE WITHOUT CARDIOVERSION N/A 07/01/2018   Procedure: TRANSESOPHAGEAL ECHOCARDIOGRAM (TEE);  Surgeon: Larey Dresser, MD;  Location: [facility];  Service: Cardiovascular;  Laterality: N/A;    Prior to Admission medications   Medication Sig Start Date End Date Taking? Authorizing Provider  acetaminophen (TYLENOL) 500 MG tablet Take 1,000 mg by mouth every 6 (six) hours as needed for moderate pain.   Yes [provider]  amLODipine (NORVASC) 5 MG tablet Take 5 mg by mouth every evening. 10/28/22  Yes [provider]  cetirizine (ZYRTEC) 10 MG tablet Take 10 mg by mouth daily as needed for allergies.   Yes [provider]  cyanocobalamin (VITAMIN B12) 1000 MCG tablet Take 1,000 mcg by mouth daily.   Yes [provider]  ferrous gluconate (IRON 27) 240 (27 FE) MG tablet Take 240 mg by mouth daily.   Yes [provider]  omeprazole (PRILOSEC) 40 MG capsule Take 1 capsule (40 mg total) by mouth daily. 10/22/22  Yes Zehr, Laban Emperor, PA-C    No current facility-administered medications for this encounter.    Allergies as of 10/22/2022 - Review Complete 10/22/2022  Allergen Reaction Noted   Unasyn [ampicillin-sulbactam sodium] Hives, Itching, Swelling, and Rash 01/05/2014    Family History  Problem Relation Age of Onset   Diabetes Mother    Heart disease Father     Social History   Socioeconomic History   Marital status: Married    Spouse name: Not on file   Number of children: Not on file   Years of education: Not on file   Highest education level: Not on file  Occupational History   Not on file  Tobacco Use   Smoking status: Former    Types: Cigarettes    Quit date: 04/09/1974    Years since quitting: 48.6   Smokeless tobacco: Never  Vaping Use   Vaping Use: Never used  Substance and Sexual Activity   Alcohol use: No   Drug use: No   Sexual activity: Not Currently  Other Topics  Concern   Not on file  Social History Narrative   Not on file   Social Determinants of Health   Financial Resource Strain: Not on file  Food Insecurity: Not on file  Transportation Needs: Not on file  Physical Activity: Not on file  Stress: Not on file  Social Connections: Not on file  Intimate Partner Violence: Not on file    Physical Exam: Vital signs in last 24 hours: @Wt$  (!) 158.8 kg   BMI 50.22 kg/m  GEN: NAD EYE: Sclerae anicteric ENT: MMM CV: Non-tachycardic Pulm: CTA b/l GI: Soft, NT/ND NEURO:  Alert & Oriented x [place], DO [facility]   11/26/2022 7:39 AM

## 2022-11-26 NOTE — Transfer of Care (Signed)
Immediate Anesthesia Transfer of Care Note  Patient: [name] [name]  Procedure(s) Performed: ESOPHAGOGASTRODUODENOSCOPY (EGD) WITH PROPOFOL SAVORY DILATION BIOPSY  Patient Location: PACU and Endoscopy Unit  Anesthesia Type:MAC  Level of Consciousness: awake, alert , oriented, and patient cooperative  Airway & Oxygen Therapy: Patient Spontanous Breathing  Post-op Assessment: Report given to RN and Post -op Vital signs reviewed and stable  Post vital signs: Reviewed and stable  Last Vitals:  Vitals Value Taken Time  BP    Temp    Pulse    Resp    SpO2      Last Pain:  Vitals:   11/26/22 0801  TempSrc: Oral  PainSc:          Complications: No notable events documented.

## 2022-11-26 NOTE — Discharge Instructions (Signed)
YOU HAD AN ENDOSCOPIC PROCEDURE TODAY: Refer to the procedure report and other information in the discharge instructions given to you for any specific questions about what was found during the examination. If this information does not answer your questions, please call [place] office at [phone] to clarify.  ° °YOU SHOULD EXPECT: Some feelings of bloating in the abdomen. Passage of more gas than usual. Walking can help get rid of the air that was put into your GI tract during the procedure and reduce the bloating. If you had a lower endoscopy (such as a colonoscopy or flexible sigmoidoscopy) you may notice spotting of blood in your stool or on the toilet paper. Some abdominal soreness may be present for a day or two, also. ° °DIET: Your first meal following the procedure should be a light meal and then it is ok to progress to your normal diet. A half-sandwich or bowl of soup is an example of a good first meal. Heavy or fried foods are harder to digest and may make you feel nauseous or bloated. Drink plenty of fluids but you should avoid alcoholic beverages for 24 hours. If you had a esophageal dilation, please see attached instructions for diet.   ° °ACTIVITY: Your care partner should take you home directly after the procedure. You should plan to take it easy, moving slowly for the rest of the day. You can resume normal activity the day after the procedure however YOU SHOULD NOT DRIVE, use power tools, machinery or perform tasks that involve climbing or major physical exertion for 24 hours (because of the sedation medicines used during the test).  ° °SYMPTOMS TO REPORT IMMEDIATELY: °A gastroenterologist can be reached at any hour. Please call [phone]  for any of the following symptoms:  °Following lower endoscopy (colonoscopy, flexible sigmoidoscopy) °Excessive amounts of blood in the stool  °Significant tenderness, worsening of abdominal pains  °Swelling of the abdomen that is new, acute  °Fever of 100° or  higher  °Following upper endoscopy (EGD, EUS, ERCP, esophageal dilation) °Vomiting of blood or coffee ground material  °New, significant abdominal pain  °New, significant chest pain or pain under the shoulder blades  °Painful or persistently difficult swallowing  °New shortness of breath  °Black, tarry-looking or red, bloody stools ° °FOLLOW UP:  °If any biopsies were taken you will be contacted by phone or by letter within the next 1-3 weeks. Call [phone]  if you have not heard about the biopsies in 3 weeks.  °Please also call with any specific questions about appointments or follow up tests. ° °

## 2022-11-26 NOTE — Op Note (Signed)
[facility] Patient Name: [name] [name] Procedure Date: 11/26/2022 MRN: [id number] Attending MD: Gerrit Heck , MD, [id number] Date of Birth: 05-18-1956 CSN: [id number] Age: 67 Admit Type: Outpatient Procedure:                Upper GI endoscopy Indications:              Epigastric abdominal pain, Heartburn, Suspected                            esophageal reflux, Dyspepsia, Dysphagia,                            Regurgitation Providers:                Gerrit Heck, MD, Dulcy Fanny, Janee Morn, Technician Referring MD:              Medicines:                Monitored Anesthesia Care Complications:            No immediate complications. Estimated Blood Loss:     Estimated blood loss was minimal. Procedure:                Pre-Anesthesia Assessment:                           - Prior to the procedure, a History and Physical                            was performed, and patient medications and                            allergies were reviewed. The patient's tolerance of                            previous anesthesia was also reviewed. The risks                            and benefits of the procedure and the sedation                            options and risks were discussed with the patient.                            All questions were answered, and informed consent                            was obtained. Prior Anticoagulants: The patient has                            taken no anticoagulant or antiplatelet agents. ASA                            Grade Assessment: III - A patient with  severe                            systemic disease. After reviewing the risks and                            benefits, the patient was deemed in satisfactory                            condition to undergo the procedure.                           After obtaining informed consent, the endoscope was                            passed under direct  vision. Throughout the                            procedure, the patient's blood pressure, pulse, and                            oxygen saturations were monitored continuously. The                            [id number] Olympus endoscope was introduced                            through the mouth, and advanced to the third part                            of duodenum. The upper GI endoscopy was                            accomplished without difficulty. The patient                            tolerated the procedure well. Scope In: Scope Out: Findings:      The examined esophagus was normal. A guidewire was placed and the scope       was withdrawn. Dilation was performed with a Savary dilator with no       resistance at 17 mm. The dilation site was examined following endoscope       reinsertion and showed no bleeding, mucosal tear or perforation.       Estimated blood loss: none.      Evidence of a sleeve gastrectomy was found in the gastric fundus and in       the gastric body. This was characterized by healthy appearing mucosa.      The mucosa was otherwise normal appearing throughout the stomach.       Biopsies were taken with a cold forceps for Helicobacter pylori testing.       Estimated blood loss was minimal.      The incisura, gastric antrum and pylorus were normal.      The examined duodenum was normal.      The gastroesophageal flap valve was visualized endoscopically and  classified as Hill Grade III (minimal fold, loose to endoscope, hiatal       hernia likely). Impression:               - Normal esophagus. Dilated.                           - A sleeve gastrectomy was found, characterized by                            healthy appearing mucosa.                           - Normal mucosa was found in the entire stomach.                            Biopsied.                           - Normal incisura, antrum and pylorus.                           - Normal examined  duodenum.                           - Gastroesophageal flap valve classified as Hill                            Grade III (minimal fold, loose to endoscope, hiatal                            hernia likely). Moderate Sedation:      Not Applicable - Patient had care per Anesthesia. Recommendation:           - Patient has a contact number available for                            emergencies. The signs and symptoms of potential                            delayed complications were discussed with the                            patient. Return to normal activities tomorrow.                            Written discharge instructions were provided to the                            patient.                           - Resume previous diet.                           - Continue present medications.                           -  Await pathology results.                           - Will schedule for colonoscopy with extended 2-day                            bowel preparation.                           - Trial course of OTC FDGuard or OTC peppermint oil                            for dyspepsia symptoms.                           - Suspect there is a component of reflux that could                            be secondary to the gastric sleeve and LES laxity.                            There is otherwise no esophagitis. Procedure Code(s):        --- Professional ---                           [phone], Esophagogastroduodenoscopy, flexible,                            transoral; with insertion of guide wire followed by                            passage of dilator(s) through esophagus over guide                            wire                           43239, 59, Esophagogastroduodenoscopy, flexible,                            transoral; with biopsy, single or multiple Diagnosis Code(s):        --- Professional ---                           [id number], Bariatric surgery status                           R10.13,  Epigastric pain                           R12, Heartburn                           R13.10, Dysphagia, unspecified                           R11.10, Vomiting, unspecified CPT copyright 2022 [employer]. All rights reserved. The codes documented in this report are preliminary and upon coder review may  be revised to meet current compliance requirements. Gerrit Heck, MD 11/26/2022 8:55:28 AM Number of Addenda: 0

## 2022-11-26 NOTE — Progress Notes (Signed)
Patient was brought back to the endoscopy procedure room.  Sedation started and patient positioned.  Was noted to have solid stool.  Elected to proceed with EGD with no attempt at colonoscopy due to solid stool and very high likelihood of incomplete exam/aborted procedure.  EGD completed without issue and transported to the recovery room.  Will plan to schedule colonoscopy with extended 2-day bowel preparation.   Gerrit Heck, DO, [facility]

## 2022-11-26 NOTE — Anesthesia Preprocedure Evaluation (Signed)
Anesthesia Evaluation  Patient identified by MRN, date of birth, ID band Patient awake    Reviewed: Allergy & Precautions, H&P , NPO status , Patient's Chart, lab work & pertinent test results  Airway Mallampati: III  TM Distance: <3 FB Neck ROM: Full    Dental no notable dental hx.    Pulmonary sleep apnea , former smoker   breath sounds clear to auscultation + decreased breath sounds      Cardiovascular hypertension, Normal cardiovascular exam Rhythm:Regular Rate:Normal     Neuro/Psych negative neurological ROS  negative psych ROS   GI/Hepatic negative GI ROS, Neg liver ROS,,,  Endo/Other    Morbid obesity  Renal/GU negative Renal ROS  negative genitourinary   Musculoskeletal negative musculoskeletal ROS (+)    Abdominal  (+) + obese  Peds negative pediatric ROS (+)  Hematology negative hematology ROS (+)   Anesthesia Other Findings   Reproductive/Obstetrics negative OB ROS                             Anesthesia Physical Anesthesia Plan  ASA: 3  Anesthesia Plan: MAC   Post-op Pain Management: Minimal or no pain anticipated   Induction: Intravenous  PONV Risk Score and Plan: 1 and Propofol infusion and Treatment may vary due to age or medical condition  Airway Management Planned: Simple Face Mask  Additional Equipment:   Intra-op Plan:   Post-operative Plan:   Informed Consent: I have reviewed the patients History and Physical, chart, labs and discussed the procedure including the risks, benefits and alternatives for the proposed anesthesia with the patient or authorized representative who has indicated his/her understanding and acceptance.     Dental advisory given  Plan Discussed with: CRNA and Surgeon  Anesthesia Plan Comments:        Anesthesia Quick Evaluation

## 2022-11-27 ENCOUNTER — Encounter (HOSPITAL_COMMUNITY): Payer: Self-pay | Admitting: Gastroenterology

## 2022-11-27 ENCOUNTER — Other Ambulatory Visit: Payer: Self-pay

## 2022-11-27 ENCOUNTER — Telehealth: Payer: Self-pay

## 2022-11-27 DIAGNOSIS — Z1211 Encounter for screening for malignant neoplasm of colon: Secondary | ICD-10-CM

## 2022-11-27 NOTE — Telephone Encounter (Signed)
Previsit scheduled on 01/14/23 at 1 pm and colonoscopy scheduled on 4/23 at 7:30 am due to patient's schedule. Pt verbalized understanding and had no other concerns at end of call.

## 2022-11-27 NOTE — Telephone Encounter (Signed)
-----   Message from Lavena Bullion, DO sent at 11/26/2022  8:57 AM EST ----- Patient presented today for EGD/colonoscopy.  Was noted to have solid stool, so I did not even attempt the colonoscopy.  EGD was completed.  Can you please reschedule for colonoscopy at [facility] with me.  Plan for extended 2-day bowel preparation.  Thanks.

## 2022-11-28 LAB — SURGICAL PATHOLOGY

## 2023-01-07 ENCOUNTER — Telehealth: Payer: Self-pay | Admitting: *Deleted

## 2023-01-07 NOTE — Telephone Encounter (Signed)
Yesi,  This pt's BMI is greater than 50; their procedure will need to be performed at the hospital.  Thanks,  Bearl Talarico 

## 2023-01-14 ENCOUNTER — Ambulatory Visit (AMBULATORY_SURGERY_CENTER): Payer: Medicare Other

## 2023-01-14 VITALS — Ht 70.0 in | Wt 345.0 lb

## 2023-01-14 DIAGNOSIS — Z1211 Encounter for screening for malignant neoplasm of colon: Secondary | ICD-10-CM

## 2023-01-14 MED ORDER — NA SULFATE-K SULFATE-MG SULF 17.5-3.13-1.6 GM/177ML PO SOLN: 1.0000 | Freq: Once | ORAL | 0 refills | Status: AC

## 2023-01-14 NOTE — Progress Notes (Signed)
No egg or soy allergy known to patient  No issues known to pt with past sedation with any surgeries or procedures Patient denies ever being told they had issues or difficulty with intubation  No FH of Malignant Hyperthermia Pt is not on diet pills Pt is not on  home 02  Pt is not on blood thinners  Pt denies issues with constipation at this time No A fib or A flutter Have any cardiac testing pending--no  Pt instructed to use Single[employer] or GoodRx for a price reduction on prep

## 2023-01-28 ENCOUNTER — Encounter (HOSPITAL_COMMUNITY): Payer: Self-pay | Admitting: Gastroenterology

## 2023-02-04 ENCOUNTER — Encounter (HOSPITAL_COMMUNITY): Payer: Self-pay | Admitting: Gastroenterology

## 2023-02-04 ENCOUNTER — Encounter (HOSPITAL_COMMUNITY)
Admission: RE | Disposition: A | Discharge status: Home / Self Care | Payer: Self-pay | Source: Home / Self Care | Attending: Gastroenterology

## 2023-02-04 ENCOUNTER — Encounter (HOSPITAL_COMMUNITY): Payer: Self-pay | Admitting: Anesthesiology

## 2023-02-04 ENCOUNTER — Ambulatory Visit (HOSPITAL_COMMUNITY)
Admission: RE | Admit: 2023-02-04 | Discharge: 2023-02-04 | Disposition: A | Discharge status: Home / Self Care | Payer: Medicare Other | Attending: Gastroenterology | Admitting: Gastroenterology

## 2023-02-04 DIAGNOSIS — Z1211 Encounter for screening for malignant neoplasm of colon: Secondary | ICD-10-CM | POA: Insufficient documentation

## 2023-02-04 DIAGNOSIS — Z538 Procedure and treatment not carried out for other reasons: Secondary | ICD-10-CM | POA: Insufficient documentation

## 2023-02-04 SURGERY — CANCELLED PROCEDURE

## 2023-02-04 MED ORDER — PROPOFOL 1000 MG/100ML IV EMUL
INTRAVENOUS | Status: AC
  Filled 2023-02-04: qty 100

## 2023-02-04 MED ORDER — PROPOFOL 500 MG/50ML IV EMUL
INTRAVENOUS | Status: AC
  Filled 2023-02-04: qty 50

## 2023-02-04 SURGICAL SUPPLY — 21 items
ELECT REM PT RETURN 9FT ADLT (ELECTROSURGICAL)
ELECTRODE REM PT RTRN 9FT ADLT (ELECTROSURGICAL) IMPLANT
FCP BXJMBJMB 240X2.8X (CUTTING FORCEPS)
FLOOR PAD 36X40 (MISCELLANEOUS) ×2
FORCEPS BIOP RAD 4 LRG CAP 4 (CUTTING FORCEPS) IMPLANT
FORCEPS BIOP RJ4 240 W/NDL (CUTTING FORCEPS)
FORCEPS BXJMBJMB 240X2.8X (CUTTING FORCEPS) IMPLANT
INJECTOR/SNARE I SNARE (MISCELLANEOUS) IMPLANT
LUBRICANT JELLY 4.5OZ STERILE (MISCELLANEOUS) IMPLANT
MANIFOLD NEPTUNE II (INSTRUMENTS) IMPLANT
NEEDLE SCLEROTHERAPY 25GX240 (NEEDLE) IMPLANT
PAD FLOOR 36X40 (MISCELLANEOUS) ×2 IMPLANT
PROBE APC STR FIRE (PROBE) IMPLANT
PROBE INJECTION GOLD (MISCELLANEOUS)
PROBE INJECTION GOLD 7FR (MISCELLANEOUS) IMPLANT
SNARE ROTATE MED OVAL 20MM (MISCELLANEOUS) IMPLANT
SYR 50ML LL SCALE MARK (SYRINGE) IMPLANT
TRAP SPECIMEN MUCOUS 40CC (MISCELLANEOUS) IMPLANT
TUBING ENDO SMARTCAP PENTAX (MISCELLANEOUS) IMPLANT
TUBING IRRIGATION ENDOGATOR (MISCELLANEOUS) ×2 IMPLANT
WATER STERILE IRR 1000ML POUR (IV SOLUTION) IMPLANT

## 2023-02-04 NOTE — Progress Notes (Signed)
Patient arrived and stated that his stool was still brown with some pieces though watery. Patient stated he drank all of 1 prep. He stated he did not feel all the way cleaned out. Contacted Dr. Barron Alvine and made him aware. Patient has been cancelled prior due to poor prep. Patient and Dr. Barron Alvine agree that he should rescheduled and get a double prep this time. Patient states he will call office to reschedule. Patient cancelled for today.

## 2023-02-04 NOTE — H&P (Signed)
Patient presented today for colonoscopy.  Unfortunately bowel preparation was not efficacious and still with solid, brown stool.  Procedure will be canceled and rescheduled to a future date.  Plan for extended 2-day bowel preparation for next procedure.

## 2023-02-05 ENCOUNTER — Telehealth: Payer: Self-pay

## 2023-02-05 NOTE — Telephone Encounter (Signed)
-----   Message from Vito Cirigliano V, DShellia Cleverly/2024  7:56 AM EDT ----- Patient was scheduled for colonoscopy today for screening.  Still with solid, brown stool.  Colonoscopy needs to be rescheduled at [facility] with plan for extended 2-day bowel preparation for the next procedure.  Thanks.

## 2023-02-25 ENCOUNTER — Other Ambulatory Visit: Payer: Self-pay

## 2023-02-25 DIAGNOSIS — Z1211 Encounter for screening for malignant neoplasm of colon: Secondary | ICD-10-CM

## 2023-02-25 NOTE — Telephone Encounter (Signed)
Called and spoke with patient. Pt has been scheduled for a telephone PV on 04/12/23 at 9 am, pt knows to expect a call from the nurse. Colonoscopy at [facility] has been rescheduled to Monday, 05/26/23 at 7:30 am. Patient has been advised that he will need to arrive by 6 am with a care partner. Pt knows to expect his updated appt information in the mail. Patient verbalized understanding and had no concerns at the end of the call.

## 2023-04-05 ENCOUNTER — Emergency Department (HOSPITAL_COMMUNITY)
Admission: EM | Admit: 2023-04-05 | Discharge: 2023-04-05 | Disposition: A | Discharge status: Home / Self Care | Payer: Medicare Other | Attending: Emergency Medicine | Admitting: Emergency Medicine

## 2023-04-05 ENCOUNTER — Other Ambulatory Visit: Payer: Self-pay

## 2023-04-05 DIAGNOSIS — R319 Hematuria, unspecified: Secondary | ICD-10-CM | POA: Diagnosis present

## 2023-04-05 NOTE — ED Provider Notes (Signed)
[name] [name] EMERGENCY DEPARTMENT AT [facility] Provider Note   CSN: [id number] Arrival date & time: 04/05/23  1523     History  Chief Complaint  Patient presents with   Hematuria    [name] [name] is a 67 y.o. male.  Patient complains of blood in his urine.  Patient reports he had some minor discomfort he denies any significant pain.  Patient reports that he has continued to have urine draining in the tube.  Patient reports urine is dark red.  Patient denies any other complaints he denies any illness he has not had any fever or chills he denies any urinary discomfort.  The history is provided by the patient. No language interpreter was used.  Hematuria This is a new problem. The current episode started 3 to 5 hours ago. Nothing aggravates the symptoms. Nothing relieves the symptoms. He has tried nothing for the symptoms.       Home Medications Prior to Admission medications   Medication Sig Start Date End Date Taking? Authorizing Provider  acetaminophen (TYLENOL) 500 MG tablet Take 1,000 mg by mouth every 6 (six) hours as needed for moderate pain.    [provider]  amLODipine (NORVASC) 5 MG tablet Take 5 mg by mouth every evening. 10/28/22   [provider]  cetirizine (ZYRTEC) 10 MG tablet Take 10 mg by mouth daily as needed for allergies.    [provider]  Cyanocobalamin (VITAMIN B-12 PO) Take 1 tablet by mouth daily.    [provider]  FERROUS GLUCONATE PO Take 1 tablet by mouth daily.    [provider]  omeprazole (PRILOSEC) 40 MG capsule Take 1 capsule (40 mg total) by mouth daily. 10/22/22   Zehr, Princella Pellegrini, PA-C      Allergies    Unasyn [ampicillin-sulbactam sodium]    Review of Systems   Review of Systems  Genitourinary:  Positive for hematuria.  All other systems reviewed and are negative.   Physical Exam Updated Vital Signs BP (!) 148/95 (BP Location: Left Arm)   Pulse 87   Temp 98.6 F (37 C) (Oral)    Resp 18   Ht 5\' 10"  (1.778 m)   Wt (!) 163.3 kg   SpO2 96%   BMI 51.65 kg/m  Physical Exam Vitals and nursing note reviewed.  Constitutional:      Appearance: He is well-developed.  HENT:     Head: Normocephalic.  Cardiovascular:     Rate and Rhythm: Normal rate.  Pulmonary:     Effort: Pulmonary effort is normal.  Abdominal:     General: There is no distension.  Musculoskeletal:        General: Normal range of motion.  Skin:    General: Skin is warm.  Neurological:     General: No focal deficit present.     Mental Status: He is alert and oriented to person, place, and time.     ED Results / Procedures / Treatments   Labs (all labs ordered are listed, but only abnormal results are displayed) Labs Reviewed - No data to display  EKG None  Radiology No results found.  Procedures Procedures    Medications Ordered in ED Medications - No data to display  ED Course/ Medical Decision Making/ A&P                             Medical Decision Making Patient reports some blood in his urine  after Foley was pulled on accident.  Amount and/or Complexity of Data Reviewed Independent Historian: spouse    Details: Patient is here with family who is supportive  Risk Risk Details: RN was able to make sure that balloon urine is minimally blood-tinged.  Patient counseled on bleeding he is advised if any obstruction or increased bleeding he should return to the emergency department follow-up with his urologist as needed           Final Clinical Impression(s) / ED Diagnoses Final diagnoses:  Hematuria, unspecified type    Rx / DC Orders ED Discharge Orders     None      .avs   Elson Areas, [place] 04/05/23 1727    Arby Barrette, MD 04/06/23 [phone]

## 2023-04-05 NOTE — ED Triage Notes (Signed)
Pt arrived via POV. Pt c/o hematuria that began this AM. Pt suspected they accidentally pulled their catheter. Urine is currently free flowing and dark pink. No pain.

## 2023-04-05 NOTE — ED Notes (Signed)
Catheter irrigated. Flushed well, Output pink with no clots.

## 2023-04-29 ENCOUNTER — Emergency Department (HOSPITAL_COMMUNITY): Payer: Medicare Other

## 2023-04-29 ENCOUNTER — Other Ambulatory Visit: Payer: Self-pay

## 2023-04-29 ENCOUNTER — Inpatient Hospital Stay (HOSPITAL_COMMUNITY)
Admission: EM | Admit: 2023-04-29 | Discharge: 2023-05-04 | DRG: 345 | Disposition: A | Discharge status: Home / Self Care | Payer: Medicare Other | Attending: Internal Medicine | Admitting: Internal Medicine

## 2023-04-29 DIAGNOSIS — Z87440 Personal history of urinary (tract) infections: Secondary | ICD-10-CM

## 2023-04-29 DIAGNOSIS — N319 Neuromuscular dysfunction of bladder, unspecified: Secondary | ICD-10-CM | POA: Diagnosis present

## 2023-04-29 DIAGNOSIS — K5989 Other specified functional intestinal disorders: Secondary | ICD-10-CM | POA: Diagnosis present

## 2023-04-29 DIAGNOSIS — Z8249 Family history of ischemic heart disease and other diseases of the circulatory system: Secondary | ICD-10-CM

## 2023-04-29 DIAGNOSIS — R5381 Other malaise: Secondary | ICD-10-CM | POA: Diagnosis present

## 2023-04-29 DIAGNOSIS — Z7401 Bed confinement status: Secondary | ICD-10-CM | POA: Diagnosis not present

## 2023-04-29 DIAGNOSIS — M810 Age-related osteoporosis without current pathological fracture: Secondary | ICD-10-CM | POA: Diagnosis present

## 2023-04-29 DIAGNOSIS — N202 Calculus of kidney with calculus of ureter: Secondary | ICD-10-CM | POA: Diagnosis present

## 2023-04-29 DIAGNOSIS — Z960 Presence of urogenital implants: Secondary | ICD-10-CM | POA: Diagnosis present

## 2023-04-29 DIAGNOSIS — K5939 Other megacolon: Secondary | ICD-10-CM | POA: Diagnosis present

## 2023-04-29 DIAGNOSIS — R319 Hematuria, unspecified: Secondary | ICD-10-CM

## 2023-04-29 DIAGNOSIS — G4733 Obstructive sleep apnea (adult) (pediatric): Secondary | ICD-10-CM | POA: Diagnosis present

## 2023-04-29 DIAGNOSIS — N201 Calculus of ureter: Secondary | ICD-10-CM | POA: Diagnosis not present

## 2023-04-29 DIAGNOSIS — K592 Neurogenic bowel, not elsewhere classified: Secondary | ICD-10-CM | POA: Diagnosis present

## 2023-04-29 DIAGNOSIS — Q438 Other specified congenital malformations of intestine: Secondary | ICD-10-CM | POA: Diagnosis not present

## 2023-04-29 DIAGNOSIS — Z833 Family history of diabetes mellitus: Secondary | ICD-10-CM

## 2023-04-29 DIAGNOSIS — I700 Atherosclerosis of aorta: Secondary | ICD-10-CM | POA: Diagnosis present

## 2023-04-29 DIAGNOSIS — G992 Myelopathy in diseases classified elsewhere: Secondary | ICD-10-CM | POA: Diagnosis present

## 2023-04-29 DIAGNOSIS — K449 Diaphragmatic hernia without obstruction or gangrene: Secondary | ICD-10-CM | POA: Diagnosis present

## 2023-04-29 DIAGNOSIS — Z91199 Patient's noncompliance with other medical treatment and regimen due to unspecified reason: Secondary | ICD-10-CM

## 2023-04-29 DIAGNOSIS — Z978 Presence of other specified devices: Secondary | ICD-10-CM

## 2023-04-29 DIAGNOSIS — Z87891 Personal history of nicotine dependence: Secondary | ICD-10-CM

## 2023-04-29 DIAGNOSIS — R7989 Other specified abnormal findings of blood chemistry: Secondary | ICD-10-CM

## 2023-04-29 DIAGNOSIS — E876 Hypokalemia: Secondary | ICD-10-CM | POA: Diagnosis present

## 2023-04-29 DIAGNOSIS — N210 Calculus in bladder: Secondary | ICD-10-CM | POA: Diagnosis present

## 2023-04-29 DIAGNOSIS — Z9049 Acquired absence of other specified parts of digestive tract: Secondary | ICD-10-CM

## 2023-04-29 DIAGNOSIS — D631 Anemia in chronic kidney disease: Secondary | ICD-10-CM | POA: Diagnosis present

## 2023-04-29 DIAGNOSIS — N179 Acute kidney failure, unspecified: Secondary | ICD-10-CM | POA: Diagnosis not present

## 2023-04-29 DIAGNOSIS — R140 Abdominal distension (gaseous): Secondary | ICD-10-CM | POA: Diagnosis not present

## 2023-04-29 DIAGNOSIS — Z6841 Body Mass Index (BMI) 40.0 and over, adult: Secondary | ICD-10-CM

## 2023-04-29 DIAGNOSIS — D509 Iron deficiency anemia, unspecified: Secondary | ICD-10-CM | POA: Diagnosis present

## 2023-04-29 DIAGNOSIS — Z981 Arthrodesis status: Secondary | ICD-10-CM

## 2023-04-29 DIAGNOSIS — M48061 Spinal stenosis, lumbar region without neurogenic claudication: Secondary | ICD-10-CM | POA: Diagnosis present

## 2023-04-29 DIAGNOSIS — Z9884 Bariatric surgery status: Secondary | ICD-10-CM

## 2023-04-29 DIAGNOSIS — K219 Gastro-esophageal reflux disease without esophagitis: Secondary | ICD-10-CM | POA: Diagnosis present

## 2023-04-29 DIAGNOSIS — N200 Calculus of kidney: Secondary | ICD-10-CM

## 2023-04-29 DIAGNOSIS — I10 Essential (primary) hypertension: Secondary | ICD-10-CM | POA: Diagnosis present

## 2023-04-29 DIAGNOSIS — Z888 Allergy status to other drugs, medicaments and biological substances status: Secondary | ICD-10-CM

## 2023-04-29 DIAGNOSIS — Z87442 Personal history of urinary calculi: Secondary | ICD-10-CM

## 2023-04-29 DIAGNOSIS — N390 Urinary tract infection, site not specified: Secondary | ICD-10-CM | POA: Diagnosis not present

## 2023-04-29 DIAGNOSIS — M48062 Spinal stenosis, lumbar region with neurogenic claudication: Secondary | ICD-10-CM | POA: Diagnosis present

## 2023-04-29 DIAGNOSIS — N1330 Unspecified hydronephrosis: Secondary | ICD-10-CM

## 2023-04-29 DIAGNOSIS — K567 Ileus, unspecified: Principal | ICD-10-CM

## 2023-04-29 DIAGNOSIS — D638 Anemia in other chronic diseases classified elsewhere: Secondary | ICD-10-CM | POA: Diagnosis not present

## 2023-04-29 DIAGNOSIS — N189 Chronic kidney disease, unspecified: Secondary | ICD-10-CM | POA: Diagnosis not present

## 2023-04-29 DIAGNOSIS — I129 Hypertensive chronic kidney disease with stage 1 through stage 4 chronic kidney disease, or unspecified chronic kidney disease: Secondary | ICD-10-CM | POA: Diagnosis present

## 2023-04-29 LAB — COMPREHENSIVE METABOLIC PANEL
ALT: 11 U/L (ref 0–44)
AST: 19 U/L (ref 15–41)
Albumin: 3.1 g/dL — ABNORMAL LOW (ref 3.5–5.0)
Alkaline Phosphatase: 85 U/L (ref 38–126)
Anion gap: 9 (ref 5–15)
BUN: 27 mg/dL — ABNORMAL HIGH (ref 8–23)
CO2: 23 mmol/L (ref 22–32)
Calcium: 8.4 mg/dL — ABNORMAL LOW (ref 8.9–10.3)
Chloride: 106 mmol/L (ref 98–111)
Creatinine, Ser: 1.48 mg/dL — ABNORMAL HIGH (ref 0.61–1.24)
GFR, Estimated: 52 mL/min — ABNORMAL LOW (ref >60)
Glucose, Bld: 99 mg/dL (ref 70–99)
Potassium: 2.9 mmol/L — ABNORMAL LOW (ref 3.5–5.1)
Sodium: 138 mmol/L (ref 135–145)
Total Bilirubin: 0.7 mg/dL (ref 0.3–1.2)
Total Protein: 8.3 g/dL — ABNORMAL HIGH (ref 6.5–8.1)

## 2023-04-29 LAB — URINALYSIS, ROUTINE W REFLEX MICROSCOPIC
Bilirubin Urine: NEGATIVE
Glucose, UA: NEGATIVE mg/dL
Ketones, ur: NEGATIVE mg/dL
Nitrite: NEGATIVE
Protein, ur: 100 mg/dL — AB
Specific Gravity, Urine: 1.04 — ABNORMAL HIGH (ref 1.005–1.030)
WBC, UA: >50 WBC/hpf (ref 0–5)
pH: 6 (ref 5.0–8.0)

## 2023-04-29 LAB — LIPASE, BLOOD: Lipase: 32 U/L (ref 11–51)

## 2023-04-29 LAB — CBC
HCT: 35.6 % — ABNORMAL LOW (ref 39.0–52.0)
Hemoglobin: 11 g/dL — ABNORMAL LOW (ref 13.0–17.0)
MCH: 24.4 pg — ABNORMAL LOW (ref 26.0–34.0)
MCHC: 30.9 g/dL (ref 30.0–36.0)
MCV: 78.9 fL — ABNORMAL LOW (ref 80.0–100.0)
Platelets: 411 10*3/uL — ABNORMAL HIGH (ref 150–400)
RBC: 4.51 MIL/uL (ref 4.22–5.81)
RDW: 16.8 % — ABNORMAL HIGH (ref 11.5–15.5)
WBC: 6.1 10*3/uL (ref 4.0–10.5)
nRBC: 0 % (ref 0.0–0.2)

## 2023-04-29 LAB — BRAIN NATRIURETIC PEPTIDE: B Natriuretic Peptide: 123.5 pg/mL — ABNORMAL HIGH (ref 0.0–100.0)

## 2023-04-29 MED ORDER — IOHEXOL 300 MG/ML  SOLN
100.0000 mL | Freq: Once | INTRAMUSCULAR | Status: AC | PRN
  Administered 2023-04-29: 100 mL via INTRAVENOUS

## 2023-04-29 MED ORDER — SODIUM CHLORIDE 0.9 % IV SOLN
2.0000 g | Freq: Once | INTRAVENOUS | Status: AC
  Administered 2023-04-29: 2 g via INTRAVENOUS
  Filled 2023-04-29: qty 20

## 2023-04-29 MED ORDER — POTASSIUM CHLORIDE CRYS ER 20 MEQ PO TBCR
40.0000 meq | EXTENDED_RELEASE_TABLET | Freq: Once | ORAL | Status: AC
  Administered 2023-04-29: 40 meq via ORAL
  Filled 2023-04-29: qty 4

## 2023-04-29 MED ORDER — POTASSIUM CHLORIDE 10 MEQ/100ML IV SOLN
10.0000 meq | INTRAVENOUS | Status: AC
  Administered 2023-04-29 (×2): 10 meq via INTRAVENOUS
  Filled 2023-04-29 (×2): qty 100

## 2023-04-29 NOTE — ED Triage Notes (Signed)
Pt c/o abd swelling x few weeks, shortness of breath

## 2023-04-29 NOTE — ED Provider Notes (Addendum)
[name] [name] Provider Note   CSN: [id number] Arrival date & time: 04/29/23  1258     History  Chief Complaint  Patient presents with   Bloated    [name] [name] is a 67 y.o. male with history of anemia arthritis, spinal stenosis, morbid obesity, hypertension, osteoporosis, OSA, chronic kidney disease, GERD who presents the emergency department complaining of abdominal swelling and shortness of breath x 1 week.  Patient reports that his abdomen has been getting more swollen for a few weeks, but started getting more short of breath this week.  He took lasix (thinks it is 10 mg) once earlier this week but hasn't since. No fever or chills. States his foley has been draining well but has noted a foul smell.   HPI     Home Medications Prior to Admission medications   Medication Sig Start Date End Date Taking? Authorizing Provider  acetaminophen (TYLENOL) 500 MG tablet Take 1,000 mg by mouth every 6 (six) hours as needed for moderate pain.   Yes [provider]  amLODipine (NORVASC) 5 MG tablet Take 5 mg by mouth every evening. 10/28/22  Yes [provider]  cetirizine (ZYRTEC) 10 MG tablet Take 10 mg by mouth daily as needed for allergies.   Yes [provider]  Cyanocobalamin (VITAMIN B-12 PO) Take 1 tablet by mouth daily.   Yes [provider]  FERROUS GLUCONATE PO Take 1 tablet by mouth daily.   Yes [provider]      Allergies    Unasyn [ampicillin-sulbactam sodium]    Review of Systems   Review of Systems  Constitutional:  Negative for chills and fever.  Respiratory:  Positive for shortness of breath.   Gastrointestinal:  Positive for abdominal distention and nausea. Negative for constipation, diarrhea and vomiting.  Genitourinary:        Foul smelling urine  All other systems reviewed and are negative.   Physical Exam Updated Vital Signs BP 106/68 (BP Location: Left Arm)   Pulse 70    Temp 98.4 F (36.9 C) (Oral)   Resp 14   Ht 5\' 10"  (1.778 m)   Wt (!) 163.3 kg   SpO2 95%   BMI 51.66 kg/m  Physical Exam Vitals and nursing note reviewed. Exam conducted with a chaperone present.  Constitutional:      Appearance: Normal appearance.  HENT:     Head: Normocephalic and atraumatic.  Eyes:     Conjunctiva/sclera: Conjunctivae normal.  Cardiovascular:     Rate and Rhythm: Normal rate and regular rhythm.  Pulmonary:     Effort: Pulmonary effort is normal. No respiratory distress.     Breath sounds: Decreased air movement present. Rales present.  Abdominal:     General: Abdomen is protuberant. There is distension.     Tenderness: There is no abdominal tenderness.     Comments: Very protuberant abdomen, very tight on palpation, no tenderness  Genitourinary:    Comments: Prior penile trauma with penis splitting from tip of urethra up to the base of the penis. Tissue well vascularized. Indwelling foley catheter with drainage of dark cloudy urine, site around foley without skin breakdown.  Skin:    General: Skin is warm and dry.  Neurological:     General: No focal deficit present.     Mental Status: He is alert.     ED Results / Procedures / Treatments   Labs (all labs ordered are listed, but only abnormal results  are displayed) Labs Reviewed  CBC - Abnormal; Notable for the following components:      Result Value   Hemoglobin 11.0 (*)    HCT 35.6 (*)    MCV 78.9 (*)    MCH 24.4 (*)    RDW 16.8 (*)    Platelets 411 (*)    All other components within normal limits  COMPREHENSIVE METABOLIC PANEL - Abnormal; Notable for the following components:   Potassium 2.9 (*)    BUN 27 (*)    Creatinine, Ser 1.48 (*)    Calcium 8.4 (*)    Total Protein 8.3 (*)    Albumin 3.1 (*)    GFR, Estimated 52 (*)    All other components within normal limits  URINALYSIS, ROUTINE W REFLEX MICROSCOPIC - Abnormal; Notable for the following components:   APPearance TURBID (*)     Specific Gravity, Urine 1.040 (*)    Hgb urine dipstick MODERATE (*)    Protein, ur 100 (*)    Leukocytes,Ua MODERATE (*)    Bacteria, UA MANY (*)    All other components within normal limits  BRAIN NATRIURETIC PEPTIDE - Abnormal; Notable for the following components:   B Natriuretic Peptide 123.5 (*)    All other components within normal limits  URINE CULTURE  LIPASE, BLOOD    EKG EKG Interpretation Date/Time:  Tuesday April 29 2023 13:09:37 EDT Ventricular Rate:  79 PR Interval:    QRS Duration:  151 QT Interval:  395 QTC Calculation: 447 R Axis:   -87  Text Interpretation: SInus rhythm, baseline artifact Confirmed by Alvester Chou [id number]) on 04/29/2023 5:00:01 PM  Radiology CT ABDOMEN PELVIS W CONTRAST  Result Date: 04/29/2023 CLINICAL DATA:  Ascites, abdominal distension, dyspnea EXAM: CT ABDOMEN AND PELVIS WITH CONTRAST TECHNIQUE: Multidetector CT imaging of the abdomen and pelvis was performed using the standard protocol following bolus administration of intravenous contrast. RADIATION DOSE REDUCTION: This exam was performed according to the departmental dose-optimization program which includes automated exposure control, adjustment of the mA and/or kV according to patient size and/or use of iterative reconstruction technique. CONTRAST:  OMNIPAQUE IOHEXOL 300 MG/ML  SOLN COMPARISON:  03/27/2021 FINDINGS: Lower chest: No acute abnormality. Mild cardiomegaly. Small hiatal hernia. Hepatobiliary: No focal liver abnormality is seen. Status post cholecystectomy. No biliary dilatation. Pancreas: Unremarkable Spleen: Unremarkable Adrenals/Urinary Tract: The adrenal glands are unremarkable. Kidneys are normal in position. Mild bilateral cortical atrophy. Previously noted elongate right ureteral calculus has migrated slightly distally now at the level of the right transverse process of L5, measuring 7 x 9 x 15 mm. There has developed moderate right hydronephrosis. Additionally, there  is marked proximal urothelial thickening and mild peripelvic and periureteric inflammatory stranding in keeping with urothelial inflammation, as can be seen with superimposed infection. Additional bilateral nonobstructing renal calculi are identified measuring up to 14 mm bilaterally. No hydronephrosis on the left. No ureteral calculi on the left. Mild bilateral nonspecific perinephric stranding again noted with no loculated perinephric fluid collection identified. Foley catheter balloon is seen within a decompressed bladder lumen. 7 mm bladder calculus now identified within the decompressed bladder lumen. Stomach/Bowel: There is marked gaseous distension of the colon without evidence of obstruction most suggestive of a colonic ileus. This appears progressive since prior examination and the transverse colon measures up to 16 mm in diameter. Surgical changes of gastric sleeve resection are identified. The small bowel is unremarkable. Appendix is unremarkable. No free intraperitoneal gas or fluid. Vascular/Lymphatic: Aortic atherosclerosis. No enlarged abdominal  or pelvic lymph nodes. Reproductive: Prostate is unremarkable. Other: No abdominal wall hernia. Mild nonspecific subcutaneous edema within the pannus. Musculoskeletal: Degenerative and postsurgical changes are noted within the lumbar spine with ankylosis of the vertebral bodies and posterior elements of L4-S1. Bridging osteophytes are noted within the visualized thoracolumbar spine from a T7 through L1. No acute bone abnormality. No lytic or blastic bone lesion. IMPRESSION: 1. Interval distal migration of the previously noted elongate right ureteral calculus now at the level of the right transverse process of L5, measuring 7 x 9 x 15 mm. Interval development of moderate right hydronephrosis. Additionally, there is marked proximal urothelial thickening and mild peripelvic and periureteric inflammatory stranding in keeping with urothelial inflammation, as can be  seen with superimposed infection. Correlation with urinalysis and urine culture may be helpful for further management. 2. Superimposed moderate bilateral nonobstructing nephrolithiasis. 3. 7 mm bladder calculus now identified within the decompressed bladder lumen in keeping with a recently passed calculus. 4. Progressive gaseous distension of the colon without evidence of obstruction most suggestive of a colonic ileus. Transverse colon measures up to 16 cm in diameter. 5. Mild cardiomegaly. Small hiatal hernia. 6. Aortic atherosclerosis. Aortic Atherosclerosis (ICD10-I70.0). Electronically Signed   By: Helyn Numbers M.D.   On: 04/29/2023 21:02   DG Chest Port 1 View  Result Date: 04/29/2023 CLINICAL DATA:  Shortness of breath over the last several weeks EXAM: PORTABLE CHEST 1 VIEW COMPARISON:  12/25/2018 FINDINGS: Cardiomegaly with left ventricular prominence. The patient has taken a poor inspiration. The upper lungs are clear. Possible mild atelectasis or infiltrate at the lung bases. No evidence of heart failure or effusion. Consider two-view chest radiography if concern persists. IMPRESSION: Cardiomegaly with left ventricular prominence. Poor inspiration. Possible mild atelectasis or infiltrate at the lung bases. Electronically Signed   By: Paulina Fusi M.D.   On: 04/29/2023 17:10    Procedures BLADDER CATHETERIZATION  Date/Time: 04/29/2023 9:47 PM  Performed by: Su Monks, PA-C Authorized by: Su Monks, PA-C   Consent:    Consent obtained:  Verbal   Consent given by:  Patient   Risks, benefits, and alternatives were discussed: yes     Risks discussed:  False passage, incomplete procedure, pain, infection and urethral injury   Alternatives discussed:  No treatment Universal protocol:    Procedure explained and questions answered to patient or proxy's satisfaction: yes     Patient identity confirmed:  Provided demographic data Pre-procedure details:    Procedure purpose:   Diagnostic   Preparation: Patient was prepped and draped in usual sterile fashion   Anesthesia:    Anesthesia method:  None Procedure details:    Provider performed due to:  Altered anatomy and nurse unable to complete   Altered anatomy:  Trauma   Catheter insertion:  Indwelling   Catheter type:  Foley   Catheter size:  18 Fr   Bladder irrigation: no     Number of attempts:  1   Urine characteristics:  Cloudy and dark Post-procedure details:    Procedure completion:  Tolerated well, no immediate complications     Medications Ordered in ED Medications  potassium chloride 10 mEq in 100 mL IVPB (10 mEq Intravenous New Bag/Given 04/29/23 2155)  cefTRIAXone (ROCEPHIN) 2 g in sodium chloride 0.9 % 100 mL IVPB (2 g Intravenous New Bag/Given 04/29/23 2159)  potassium chloride SA (KLOR-CON M) CR tablet 40 mEq (40 mEq Oral Given 04/29/23 1959)  iohexol (OMNIPAQUE) 300 MG/ML solution 100 mL (100  mLs Intravenous Contrast Given 04/29/23 1945)    ED Course/ Medical Decision Making/ A&P                             Medical Decision Making Amount and/or Complexity of Data Reviewed Labs: ordered. Radiology: ordered.  Risk Prescription drug management. Decision regarding hospitalization.   This patient is a 67 y.o. male  who presents to the ED for concern of abdominal distention.   Differential diagnoses prior to evaluation: The emergent differential diagnosis includes, but is not limited to,  ascites, liver failure, anasarca, bowel obstruction, ileus . This is not an exhaustive differential.   Past Medical History / Co-morbidities / Social History: anemia arthritis, spinal stenosis, morbid obesity, hypertension, osteoporosis, OSA, chronic kidney disease, GERD  Additional history: Chart reviewed. Pertinent results include: Reviewed ultrasound imaging from January 2024 ordered by gastroenterology with concern for ascites. US showed no evidence of ascites.   Hx of cholecystectomy,  laparoscopic gastric sleeve resection.   Physical Exam: Physical exam performed. The pertinent findings include: Normal vital signs, no acute distress.  Rales bilaterally.  Abdomen significant protuberant, nontender.  Indwelling Foley catheter present, draining dark cloudy urine, malodorous.  Lab Tests/Imaging studies: I personally interpreted labs/imaging and the pertinent results include: No cytosis, stable hemoglobin.  Potassium 2.9, AKI with creatinine 1.48, BUN 27, GFR 52.  Normal lipase.  Mildly elevated BNP to 123.5.  UA and urine culture pending at time of admission.   Chest x-ray with cardiomegaly, mild atelectasis versus infiltrate at the lung bases.  CT abdomen pelvis with ureteral calculus on the right measuring 7 x 9 x 15 mm with associated right hydronephrosis and inflammatory stranding.  Gaseous distention of the colon without obstruction, likely colonic ileus.  I agree with the radiologist interpretation.  Cardiac monitoring: EKG obtained and interpreted by myself and attending physician which shows: Sinus rhythm   Medications: I ordered medication including potassium replacement and antibiotics for likely UTI.  I have reviewed the patients home medicines and have made adjustments as needed.  Procedures: Foley catheter exchanged by myself due to nursing request due to patient's altered anatomy. Catheter exchanged successfully with return of urine. Patient tolerated procedure well. Urinalysis and culture obtained from new foley.   Consultations obtained: I consulted with hospitalist Dr Maisie Fus who will admit. She requested surgical consultation which my attending physician will perform.    Disposition: After consideration of the diagnostic results and the patients response to treatment, I feel that patient would benefit from medical admission for further workup and treatment of ureterolithiasis with AKI and likely concomitant infection, colonic ileus, and elevated BNP.   I  discussed this case with my attending physician Dr. Renaye Rakers who cosigned this note including patient's presenting symptoms, physical exam, and planned diagnostics and interventions. Attending physician stated agreement with plan or made changes to plan which were implemented.   Final Clinical Impression(s) / ED Diagnoses Final diagnoses:  Abdominal bloating  Ileus (HCC)  Ureterolithiasis  Elevated brain natriuretic peptide (BNP) level    Rx / DC Orders ED Discharge Orders     None      Portions of this report may have been transcribed using voice recognition software. Every effort was made to ensure accuracy; however, inadvertent computerized transcription errors may be present.    Su Monks, PA-C 04/29/23 2215    Rosamary Boudreau T, PA-C 04/29/23 2216    Terald Sleeper, MD 04/29/23 2228

## 2023-04-29 NOTE — ED Notes (Signed)
.ED TO INPATIENT HANDOFF REPORT  Name/Age/Gender Scot Jun 67 y.o. male  Code Status Code Status History     Date Active Date Inactive Code Status Order ID Comments User Context   12/10/2018 1923 12/29/2018 1437 Full Code [id number]  Lynnae Prude Inpatient   12/07/2018 1538 12/10/2018 1752 Full Code [id number]  Julio Sicks, MD Inpatient   07/09/2018 1749 08/08/2018 0432 Full Code [id number]  Charlton Amor, PA-C Inpatient   07/09/2018 1749 07/09/2018 1749 Full Code [id number]  Charlton Amor, PA-C Inpatient   06/29/2018 2236 07/09/2018 1713 Full Code [id number]  Kerrin Champagne, MD Inpatient   06/28/2018 1259 06/29/2018 2236 Full Code [id number]  Kerrin Champagne, MD ED   11/17/2017 1427 11/20/2017 1620 Full Code [id number]  Kerrin Champagne, MD Inpatient   03/21/2016 1121 03/25/2016 1649 Full Code [id number]  Nonie Hoyer, PA-C ED   12/27/2013 2343 12/30/2013 1849 Full Code [id number]  Hillary Bow, DO ED       Home/SNF/Other Home  Chief Complaint [facility]) [K56.7]  Level of Care/Admitting Diagnosis ED Disposition     ED Disposition  Admit   Condition  --   Comment  Hospital Area: [facility] [[id number]]  Level of Care: Progressive [102]  Admit to Progressive based on following criteria: GI, ENDOCRINE disease patients with GI bleeding, acute liver failure or pancreatitis, stable with diabetic ketoacidosis or thyrotoxicosis (hypothyroid) state.  May admit patient to [facility] or [facility] if equivalent level of care is available:: No  Covid Evaluation: Asymptomatic - no recent exposure (last 10 days) testing not required  Diagnosis: Ileus [facility]) [[id number]]  Admitting Physician: Lurline Del [[id number]]  Attending Physician: Lurline Del [[id number]]  Certification:: I certify this patient will need inpatient services for at least 2 midnights  Estimated Length of Stay: 3          Medical History Past Medical History:  Diagnosis Date    Anemia    Arthritis    Chronic kidney disease    HX acute kidney failure / acute pyelonephritis / hydronephrosis / severe sepsis per discharge summary 12/27/13   GERD (gastroesophageal reflux disease)    History of kidney stones    Hypertension    Morbid obesity (HCC)    Obstructive sleep apnea    does not need c pap since 110 lb wt loss   Osteoporosis    Prurigo 2002   Scars    ON ARMS FROM CHEMICAL EXPLOSION 1999   Spinal stenosis     Allergies Allergies  Allergen Reactions   Unasyn [Ampicillin-Sulbactam Sodium] Hives, Itching, Swelling and Rash    IV Location/Drains/Wounds Patient Lines/Drains/Airways Status     Active Line/Drains/Airways     Name Placement date Placement time Site Days   Peripheral IV 04/29/23 20 G Anterior;Left Forearm 04/29/23  1752  Forearm  less than 1   PICC Single Lumen 07/08/18 PICC Right Brachial 43 cm 0 cm 07/08/18  1814  Brachial  1756   Urethral Catheter Wrenn, MD Non-latex;Straight-tip 18 Fr. 05/15/21  0855  Non-latex;Straight-tip  714   Ureteral Drain/Stent Right ureter 6 Fr. 05/15/21  0853  Right ureter  714            Labs/Imaging Results for orders placed or performed during the hospital encounter of 04/29/23 (from the past 48 hour(s))  Brain natriuretic peptide     Status: Abnormal   Collection Time: 04/29/23  5:01 PM  Result Value Ref Range  B Natriuretic Peptide 123.5 (H) 0.0 - 100.0 pg/mL    Comment: Performed at [facility], [address] [place], [place] [address]  CBC     Status: Abnormal   Collection Time: 04/29/23  5:47 PM  Result Value Ref Range   WBC 6.1 4.0 - 10.5 K/uL   RBC 4.51 4.22 - 5.81 MIL/uL   Hemoglobin 11.0 (L) 13.0 - 17.0 g/dL   HCT 60.4 (L) 54.0 - 98.1 %   MCV 78.9 (L) 80.0 - 100.0 fL   MCH 24.4 (L) 26.0 - 34.0 pg   MCHC 30.9 30.0 - 36.0 g/dL   RDW 19.1 (H) 47.8 - 29.5 %   Platelets 411 (H) 150 - 400 K/uL   nRBC 0.0 0.0 - 0.2 %    Comment: Performed at [facility], [address], [place], [place] [address]  Comprehensive metabolic panel     Status: Abnormal   Collection Time: 04/29/23  5:47 PM  Result Value Ref Range   Sodium 138 135 - 145 mmol/L   Potassium 2.9 (L) 3.5 - 5.1 mmol/L   Chloride 106 98 - 111 mmol/L   CO2 23 22 - 32 mmol/L   Glucose, Bld 99 70 - 99 mg/dL    Comment: Glucose reference range applies only to samples taken after fasting for at least 8 hours.   BUN 27 (H) 8 - 23 mg/dL   Creatinine, Ser 8.65 (H) 0.61 - 1.24 mg/dL   Calcium 8.4 (L) 8.9 - 10.3 mg/dL   Total Protein 8.3 (H) 6.5 - 8.1 g/dL   Albumin 3.1 (L) 3.5 - 5.0 g/dL   AST 19 15 - 41 U/L   ALT 11 0 - 44 U/L   Alkaline Phosphatase 85 38 - 126 U/L   Total Bilirubin 0.7 0.3 - 1.2 mg/dL   GFR, Estimated 52 (L) >60 mL/min    Comment: (NOTE) Calculated using the CKD-EPI Creatinine Equation (2021)    Anion gap 9 5 - 15    Comment: Performed at [facility], [address] [place], [place] [address]  Lipase, blood     Status: None   Collection Time: 04/29/23  5:47 PM  Result Value Ref Range   Lipase 32 11 - 51 U/L    Comment: Performed at [facility], [address], [place], [place] [address]  Urinalysis, Routine w reflex microscopic -Urine, Catheterized; Indwelling urinary catheter     Status: Abnormal   Collection Time: 04/29/23  9:45 PM  Result Value Ref Range   Color, Urine YELLOW YELLOW   APPearance TURBID (A) CLEAR   Specific Gravity, Urine 1.040 (H) 1.005 - 1.030   pH 6.0 5.0 - 8.0   Glucose, UA NEGATIVE NEGATIVE mg/dL   Hgb urine dipstick MODERATE (A) NEGATIVE   Bilirubin Urine NEGATIVE NEGATIVE   Ketones, ur NEGATIVE NEGATIVE mg/dL   Protein, ur 841 (A) NEGATIVE mg/dL   Nitrite NEGATIVE NEGATIVE   Leukocytes,Ua MODERATE (A) NEGATIVE   RBC / HPF 21-50 0 - 5 RBC/hpf   WBC, UA >50 0 - 5 WBC/hpf   Bacteria, UA MANY (A) NONE SEEN   Squamous Epithelial / HPF 0-5 0 - 5 /HPF   WBC Clumps PRESENT     Comment:  Performed at [facility], [address], [place], [place] [address]   CT ABDOMEN PELVIS W CONTRAST  Result Date: 04/29/2023 CLINICAL DATA:  Ascites, abdominal distension, dyspnea EXAM: CT ABDOMEN AND PELVIS  WITH CONTRAST TECHNIQUE: Multidetector CT imaging of the abdomen and pelvis was performed using the standard protocol following bolus administration of intravenous contrast. RADIATION DOSE REDUCTION: This exam was performed according to the departmental dose-optimization program which includes automated exposure control, adjustment of the mA and/or kV according to patient size and/or use of iterative reconstruction technique. CONTRAST:  OMNIPAQUE IOHEXOL 300 MG/ML  SOLN COMPARISON:  03/27/2021 FINDINGS: Lower chest: No acute abnormality. Mild cardiomegaly. Small hiatal hernia. Hepatobiliary: No focal liver abnormality is seen. Status post cholecystectomy. No biliary dilatation. Pancreas: Unremarkable Spleen: Unremarkable Adrenals/Urinary Tract: The adrenal glands are unremarkable. Kidneys are normal in position. Mild bilateral cortical atrophy. Previously noted elongate right ureteral calculus has migrated slightly distally now at the level of the right transverse process of L5, measuring 7 x 9 x 15 mm. There has developed moderate right hydronephrosis. Additionally, there is marked proximal urothelial thickening and mild peripelvic and periureteric inflammatory stranding in keeping with urothelial inflammation, as can be seen with superimposed infection. Additional bilateral nonobstructing renal calculi are identified measuring up to 14 mm bilaterally. No hydronephrosis on the left. No ureteral calculi on the left. Mild bilateral nonspecific perinephric stranding again noted with no loculated perinephric fluid collection identified. Foley catheter balloon is seen within a decompressed bladder lumen. 7 mm bladder calculus now identified within the decompressed bladder lumen.  Stomach/Bowel: There is marked gaseous distension of the colon without evidence of obstruction most suggestive of a colonic ileus. This appears progressive since prior examination and the transverse colon measures up to 16 mm in diameter. Surgical changes of gastric sleeve resection are identified. The small bowel is unremarkable. Appendix is unremarkable. No free intraperitoneal gas or fluid. Vascular/Lymphatic: Aortic atherosclerosis. No enlarged abdominal or pelvic lymph nodes. Reproductive: Prostate is unremarkable. Other: No abdominal wall hernia. Mild nonspecific subcutaneous edema within the pannus. Musculoskeletal: Degenerative and postsurgical changes are noted within the lumbar spine with ankylosis of the vertebral bodies and posterior elements of L4-S1. Bridging osteophytes are noted within the visualized thoracolumbar spine from a T7 through L1. No acute bone abnormality. No lytic or blastic bone lesion. IMPRESSION: 1. Interval distal migration of the previously noted elongate right ureteral calculus now at the level of the right transverse process of L5, measuring 7 x 9 x 15 mm. Interval development of moderate right hydronephrosis. Additionally, there is marked proximal urothelial thickening and mild peripelvic and periureteric inflammatory stranding in keeping with urothelial inflammation, as can be seen with superimposed infection. Correlation with urinalysis and urine culture may be helpful for further management. 2. Superimposed moderate bilateral nonobstructing nephrolithiasis. 3. 7 mm bladder calculus now identified within the decompressed bladder lumen in keeping with a recently passed calculus. 4. Progressive gaseous distension of the colon without evidence of obstruction most suggestive of a colonic ileus. Transverse colon measures up to 16 cm in diameter. 5. Mild cardiomegaly. Small hiatal hernia. 6. Aortic atherosclerosis. Aortic Atherosclerosis (ICD10-I70.0). Electronically Signed   By:  Helyn Numbers M.D.   On: 04/29/2023 21:02   DG Chest Port 1 View  Result Date: 04/29/2023 CLINICAL DATA:  Shortness of breath over the last several weeks EXAM: PORTABLE CHEST 1 VIEW COMPARISON:  12/25/2018 FINDINGS: Cardiomegaly with left ventricular prominence. The patient has taken a poor inspiration. The upper lungs are clear. Possible mild atelectasis or infiltrate at the lung bases. No evidence of heart failure or effusion. Consider two-view chest radiography if concern persists. IMPRESSION: Cardiomegaly with left ventricular prominence. Poor inspiration. Possible mild atelectasis or infiltrate  at the lung bases. Electronically Signed   By: Paulina Fusi M.D.   On: 04/29/2023 17:10    Pending Labs Unresulted Labs (From admission, onward)     Start     Ordered   04/29/23 2111  Urine Culture  Add-on,   AD       Question:  Indication  Answer:  Dysuria   04/29/23 2111            Vitals/Pain Today's Vitals   04/29/23 1721 04/29/23 1930 04/29/23 2015 04/29/23 2153  BP: 121/81 136/86 (!) 157/95 106/68  Pulse: 86 74 72 70  Resp: 18  14   Temp: (!) 97.4 F (36.3 C)   98.4 F (36.9 C)  TempSrc: Oral   Oral  SpO2: 98% 99% 96% 95%  Weight:      Height:      PainSc:        Isolation Precautions No active isolations  Medications Medications  potassium chloride 10 mEq in 100 mL IVPB (10 mEq Intravenous New Bag/Given 04/29/23 2155)  potassium chloride SA (KLOR-CON M) CR tablet 40 mEq (40 mEq Oral Given 04/29/23 1959)  iohexol (OMNIPAQUE) 300 MG/ML solution 100 mL (100 mLs Intravenous Contrast Given 04/29/23 1945)  cefTRIAXone (ROCEPHIN) 2 g in sodium chloride 0.9 % 100 mL IVPB (2 g Intravenous New Bag/Given 04/29/23 2159)    Mobility power wheelchair

## 2023-04-29 NOTE — H&P (Incomplete)
History and Physical    [name] GLO:[id number] DOB: 26-Apr-1956 DOA: 04/29/2023  PCP: Renford Dills, MD  Patient coming from: home  I have personally briefly reviewed patient's old medical records in [facility]  Chief Complaint: abdominal bloating   HPI: [name] [name] is a 67 y.o. male with medical history significant of  CKDIII, hydronephrosis, recurrent UTI in setting of neurogenic bladder with chronic indwelling foley, hypertension , OSA noncompliant with cpap, Spinal stenosis lumbar region with associated myelopathy, morbid obesity, who presents to ED with complaint of painless abdominal bloating x 1-2 weeks with continued progression. Patient notes that he has no n/v/d/abdominal pain and noted that he continues to move his bowels. Patient notes last bowel movement today. He notes fever chills, sob / chest pain  or flank pain.   ED Course:   In ed patient  was found on CT scan to have  Ileus with transverse colon diameter of 16 cm, renal stone 15mm GD on right, bladder stone as well as right hydronephrosis. Case was discussed with Dr Derrell Lolling from surgery who noted no acute intervention required for colonic distention including no need for decompression and recommended GI consultation in am.  Vitals: Afeb  BP 123/85, HR 77 , rr 19 sat 94%  JOA:CZYSA rhythm Bnp 123.5 Wbc: 6.1, hgb 11, MCV78.9, PLT 411 Na 138, K 2.9 , CL 106, bicarb 23,  cr 1.48 ( 0.85) UA: wbc >50, rbc 21-50 CTAB IMPRESSION: 1. Interval distal migration of the previously noted elongate right ureteral calculus now at the level of the right transverse process of L5, measuring 7 x 9 x 15 mm. Interval development of moderate right hydronephrosis. Additionally, there is marked proximal urothelial thickening and mild peripelvic and periureteric inflammatory stranding in keeping with urothelial inflammation, as can be seen with superimposed infection. Correlation with urinalysis and urine culture may be helpful  for further management. 2. Superimposed moderate bilateral nonobstructing nephrolithiasis. 3. 7 mm bladder calculus now identified within the decompressed bladder lumen in keeping with a recently passed calculus. 4. Progressive gaseous distension of the colon without evidence of obstruction most suggestive of a colonic ileus. Transverse colon measures up to 16 cm in diameter. 5. Mild cardiomegaly. Small hiatal hernia. 6. Aortic atherosclerosis. Review of Systems: As per HPI otherwise 10 point review of systems negative.   Past Medical History:  Diagnosis Date   Anemia    Arthritis    Chronic kidney disease    HX acute kidney failure / acute pyelonephritis / hydronephrosis / severe sepsis per discharge summary 12/27/13   GERD (gastroesophageal reflux disease)    History of kidney stones    Hypertension    Morbid obesity (HCC)    Obstructive sleep apnea    does not need c pap since 110 lb wt loss   Osteoporosis    Prurigo 2002   Scars    ON ARMS FROM CHEMICAL EXPLOSION 1999   Spinal stenosis     Past Surgical History:  Procedure Laterality Date   ANTERIOR CERVICAL DECOMP/DISCECTOMY FUSION N/A 12/07/2018   Procedure: ANTERIOR CERVICAL DECOMPRESSION FUSION - CERVICAL THREE-CERVICAL FOUR, CERVICAL FOUR-CERVICAL FIVE, CERVICAL FIVE-CERVICAL SIX;  Surgeon: Julio Sicks, MD;  Location: [facility] OR;  Service: Neurosurgery;  Laterality: N/A;   BACK SURGERY  01/23/2010   lumbar   BIOPSY  11/26/2022   Procedure: BIOPSY;  Surgeon: Shellia Cleverly, DO;  Location: [facility] ENDOSCOPY;  Service: Gastroenterology;;   CHOLECYSTECTOMY N/A 03/24/2016   Procedure: LAPAROSCOPIC CHOLECYSTECTOMY WITH INTRAOPERATIVE CHOLANGIOGRAM;  Surgeon: Chevis Pretty III, MD;  Location: [facility] ORS;  Service: General;  Laterality: N/A;   CIRCUMCISION     CYSTOSCOPY WITH RETROGRADE PYELOGRAM, URETEROSCOPY AND STENT PLACEMENT Left 01/20/2014   Procedure: CYSTOSCOPY WITH RETROGRADE PYELOGRAM, URETEROSCOPY AND STENT EXCHANGE;  Surgeon: Milford Cage, MD;  Location: [facility]S;  Service: Urology;  Laterality: Left;  bugbee bladder fulguration   CYSTOSCOPY WITH STENT PLACEMENT Left 12/28/2013   Procedure: CYSTOSCOPY WITH STENT PLACEMENT left retrograde pyleogram;  Surgeon: Milford Cage, MD;  Location: [facility];  Service: Urology;  Laterality: Left;   CYSTOSCOPY/URETEROSCOPY/HOLMIUM LASER/STENT PLACEMENT Right 05/15/2021   Procedure: CYSTOSCOPY RIGHT RETROGRADE RIGHT URETEROSCOPY/HOLMIUM LASER/STENT PLACEMENT;  Surgeon: Bjorn Pippin, MD;  Location: [facility];  Service: Urology;  Laterality: Right;   ESOPHAGOGASTRODUODENOSCOPY N/A 12/07/2012   Procedure: ESOPHAGOGASTRODUODENOSCOPY (EGD);  Surgeon: Lodema Pilot, DO;  Location: [facility];  Service: General;  Laterality: N/A;   ESOPHAGOGASTRODUODENOSCOPY (EGD) WITH PROPOFOL N/A 11/26/2022   Procedure: ESOPHAGOGASTRODUODENOSCOPY (EGD) WITH PROPOFOL;  Surgeon: Shellia Cleverly, DO;  Location: [facility];  Service: Gastroenterology;  Laterality: N/A;   HOLMIUM LASER APPLICATION Left 01/20/2014   Procedure: HOLMIUM LASER APPLICATION;  Surgeon: Milford Cage, MD;  Location: [facility];  Service: Urology;  Laterality: Left;   KNEE ARTHROSCOPY Left 06/29/2018   Procedure: ARTHROSCOPY KNEE;  Surgeon: Kerrin Champagne, MD;  Location[facility] OR;  Service: Orthopedics;  Laterality: Left;   LAPAROSCOPIC GASTRIC SLEEVE RESECTION N/A 12/07/2012   Procedure: LAPAROSCOPIC GASTRIC SLEEVE RESECTION;  Surgeon: Lodema Pilot, DO;  Location: [facility];  Service: General;  Laterality: N/A;  laparoscopic sleeve gastrectomy with EGD   LUMBAR LAMINECTOMY N/A 11/17/2017   Procedure: L2-3 LAMINECTOMY AND REDO LAMINECTOMIES  L3-4, L4-5 AND L5-S1;  Surgeon: Kerrin Champagne, MD;  Location: [facility] OR;  Service: Orthopedics;  Laterality: N/A;   LUMBAR WOUND DEBRIDEMENT N/A 06/29/2018   Procedure: LUMBAR WOUND DEBRIDEMENT DRAINAGE AND IRRIGATION; AND ASPIRATION OF LEFT KNEE;  Surgeon: Kerrin Champagne, MD;  Location: [facility] OR;  Service:  Orthopedics;  Laterality: N/A;   SAVORY DILATION N/A 11/26/2022   Procedure: SAVORY DILATION;  Surgeon: Shellia Cleverly, DO;  Location: [facility];  Service: Gastroenterology;  Laterality: N/A;   TEE WITHOUT CARDIOVERSION N/A 07/01/2018   Procedure: TRANSESOPHAGEAL ECHOCARDIOGRAM (TEE);  Surgeon: Laurey Morale, MD;  Location: [facility] ENDOSCOPY;  Service: Cardiovascular;  Laterality: N/A;     reports that he quit smoking about 49 years ago. His smoking use included cigarettes. He has never used smokeless tobacco. He reports that he does not drink alcohol and does not use drugs.  Allergies  Allergen Reactions   Unasyn [Ampicillin-Sulbactam Sodium] Hives, Itching, Swelling and Rash    Family History  Problem Relation Age of Onset   Diabetes Mother    Heart disease Father    Colon cancer Neg Hx    Colon polyps Neg Hx    Esophageal cancer Neg Hx    Rectal cancer Neg Hx    Stomach cancer Neg Hx     Prior to Admission medications   Medication Sig Start Date End Date Taking? Authorizing Provider  acetaminophen (TYLENOL) 500 MG tablet Take 1,000 mg by mouth every 6 (six) hours as needed for moderate pain.   Yes [provider]  amLODipine (NORVASC) 5 MG tablet Take 5 mg by mouth every evening. 10/28/22  Yes [provider]  cetirizine (ZYRTEC) 10 MG tablet Take 10 mg by mouth daily as needed for allergies.   Yes [provider]  Cyanocobalamin (VITAMIN B-12 PO) Take 1 tablet by mouth daily.   Yes [provider]  FERROUS GLUCONATE PO Take 1 tablet by mouth daily.   Yes [provider]    Physical Exam: Vitals:   04/29/23 1721 04/29/23 1930 04/29/23 2015 04/29/23 2153  BP: 121/81 136/86 (!) 157/95 106/68  Pulse: 86 74 72 70  Resp: 18  14   Temp: (!) 97.4 F (36.3 C)   98.4 F (36.9 C)  TempSrc: Oral   Oral  SpO2: 98% 99% 96% 95%  Weight:      Height:        Constitutional: NAD, calm, comfortable Vitals:   04/29/23 1721 04/29/23  1930 04/29/23 2015 04/29/23 2153  BP: 121/81 136/86 (!) 157/95 106/68  Pulse: 86 74 72 70  Resp: 18  14   Temp: (!) 97.4 F (36.3 C)   98.4 F (36.9 C)  TempSrc: Oral   Oral  SpO2: 98% 99% 96% 95%  Weight:      Height:       Eyes: PERRL, lids and conjunctivae normal ENMT: Mucous membranes are moist. Posterior pharynx clear of any exudate or lesions.Normal dentition.  Neck: normal, supple, no masses, no thyromegaly Respiratory: clear to auscultation bilaterally, no wheezing, no crackles. Normal respiratory effort. No accessory muscle use.  Cardiovascular: Regular rate and rhythm, no murmurs / rubs / gallops. No extremity edema. 2+ pedal pulses. No carotid bruits.  Abdomen: no tenderness, no masses palpated. No hepatosplenomegaly. Bowel sounds positive.  Musculoskeletal: no clubbing / cyanosis. No joint deformity upper and lower extremities. Good ROM, no contractures. Normal muscle tone.  Skin: no rashes, lesions, ulcers. No induration Neurologic: CN 2-12 grossly intact. Sensation intact, DTR normal. Strength 5/5 in all 4.  Psychiatric: Normal judgment and insight. Alert and oriented x 3. Normal mood.    Labs on Admission: I have personally reviewed following labs and imaging studies  CBC: Recent Labs  Lab 04/29/23 1747  WBC 6.1  HGB 11.0*  HCT 35.6*  MCV 78.9*  PLT 411*   Basic Metabolic Panel: Recent Labs  Lab 04/29/23 1747  NA 138  K 2.9*  CL 106  CO2 23  GLUCOSE 99  BUN 27*  CREATININE 1.48*  CALCIUM 8.4*   GFR: Estimated Creatinine Clearance: 76.8 mL/min (A) (by C-G formula based on SCr of 1.48 mg/dL (H)). Liver Function Tests: Recent Labs  Lab 04/29/23 1747  AST 19  ALT 11  ALKPHOS 85  BILITOT 0.7  PROT 8.3*  ALBUMIN 3.1*   Recent Labs  Lab 04/29/23 1747  LIPASE 32   No results for input(s): "AMMONIA" in the last 168 hours. Coagulation Profile: No results for input(s): "INR", "PROTIME" in the last 168 hours. Cardiac Enzymes: No results for  input(s): "CKTOTAL", "CKMB", "CKMBINDEX", "TROPONINI" in the last 168 hours. BNP (last 3 results) No results for input(s): "PROBNP" in the last 8760 hours. HbA1C: No results for input(s): "HGBA1C" in the last 72 hours. CBG: No results for input(s): "GLUCAP" in the last 168 hours. Lipid Profile: No results for input(s): "CHOL", "HDL", "LDLCALC", "TRIG", "CHOLHDL", "LDLDIRECT" in the last 72 hours. Thyroid Function Tests: No results for input(s): "TSH", "T4TOTAL", "FREET4", "T3FREE", "THYROIDAB" in the last 72 hours. Anemia Panel: No results for input(s): "VITAMINB12", "FOLATE", "FERRITIN", "TIBC", "IRON", "RETICCTPCT" in the last 72 hours. Urine analysis:    Component Value Date/Time   COLORURINE YELLOW 04/29/2023 2145   APPEARANCEUR TURBID (A) 04/29/2023 2145   LABSPEC 1.040 (H) 04/29/2023  2145   PHURINE 6.0 04/29/2023 2145   GLUCOSEU NEGATIVE 04/29/2023 2145   HGBUR MODERATE (A) 04/29/2023 2145   BILIRUBINUR NEGATIVE 04/29/2023 2145   KETONESUR NEGATIVE 04/29/2023 2145   PROTEINUR 100 (A) 04/29/2023 2145   UROBILINOGEN 0.2 12/27/2013 2115   NITRITE NEGATIVE 04/29/2023 2145   LEUKOCYTESUR MODERATE (A) 04/29/2023 2145    Radiological Exams on Admission: CT ABDOMEN PELVIS W CONTRAST  Result Date: 04/29/2023 CLINICAL DATA:  Ascites, abdominal distension, dyspnea EXAM: CT ABDOMEN AND PELVIS WITH CONTRAST TECHNIQUE: Multidetector CT imaging of the abdomen and pelvis was performed using the standard protocol following bolus administration of intravenous contrast. RADIATION DOSE REDUCTION: This exam was performed according to the departmental dose-optimization program which includes automated exposure control, adjustment of the mA and/or kV according to patient size and/or use of iterative reconstruction technique. CONTRAST:  OMNIPAQUE IOHEXOL 300 MG/ML  SOLN COMPARISON:  03/27/2021 FINDINGS: Lower chest: No acute abnormality. Mild cardiomegaly. Small hiatal hernia. Hepatobiliary: No  focal liver abnormality is seen. Status post cholecystectomy. No biliary dilatation. Pancreas: Unremarkable Spleen: Unremarkable Adrenals/Urinary Tract: The adrenal glands are unremarkable. Kidneys are normal in position. Mild bilateral cortical atrophy. Previously noted elongate right ureteral calculus has migrated slightly distally now at the level of the right transverse process of L5, measuring 7 x 9 x 15 mm. There has developed moderate right hydronephrosis. Additionally, there is marked proximal urothelial thickening and mild peripelvic and periureteric inflammatory stranding in keeping with urothelial inflammation, as can be seen with superimposed infection. Additional bilateral nonobstructing renal calculi are identified measuring up to 14 mm bilaterally. No hydronephrosis on the left. No ureteral calculi on the left. Mild bilateral nonspecific perinephric stranding again noted with no loculated perinephric fluid collection identified. Foley catheter balloon is seen within a decompressed bladder lumen. 7 mm bladder calculus now identified within the decompressed bladder lumen. Stomach/Bowel: There is marked gaseous distension of the colon without evidence of obstruction most suggestive of a colonic ileus. This appears progressive since prior examination and the transverse colon measures up to 16 mm in diameter. Surgical changes of gastric sleeve resection are identified. The small bowel is unremarkable. Appendix is unremarkable. No free intraperitoneal gas or fluid. Vascular/Lymphatic: Aortic atherosclerosis. No enlarged abdominal or pelvic lymph nodes. Reproductive: Prostate is unremarkable. Other: No abdominal wall hernia. Mild nonspecific subcutaneous edema within the pannus. Musculoskeletal: Degenerative and postsurgical changes are noted within the lumbar spine with ankylosis of the vertebral bodies and posterior elements of L4-S1. Bridging osteophytes are noted within the visualized thoracolumbar spine  from a T7 through L1. No acute bone abnormality. No lytic or blastic bone lesion. IMPRESSION: 1. Interval distal migration of the previously noted elongate right ureteral calculus now at the level of the right transverse process of L5, measuring 7 x 9 x 15 mm. Interval development of moderate right hydronephrosis. Additionally, there is marked proximal urothelial thickening and mild peripelvic and periureteric inflammatory stranding in keeping with urothelial inflammation, as can be seen with superimposed infection. Correlation with urinalysis and urine culture may be helpful for further management. 2. Superimposed moderate bilateral nonobstructing nephrolithiasis. 3. 7 mm bladder calculus now identified within the decompressed bladder lumen in keeping with a recently passed calculus. 4. Progressive gaseous distension of the colon without evidence of obstruction most suggestive of a colonic ileus. Transverse colon measures up to 16 cm in diameter. 5. Mild cardiomegaly. Small hiatal hernia. 6. Aortic atherosclerosis. Aortic Atherosclerosis (ICD10-I70.0). Electronically Signed   By: Helyn Numbers M.D.   On:  04/29/2023 21:02   DG Chest Port 1 View  Result Date: 04/29/2023 CLINICAL DATA:  Shortness of breath over the last several weeks EXAM: PORTABLE CHEST 1 VIEW COMPARISON:  12/25/2018 FINDINGS: Cardiomegaly with left ventricular prominence. The patient has taken a poor inspiration. The upper lungs are clear. Possible mild atelectasis or infiltrate at the lung bases. No evidence of heart failure or effusion. Consider two-view chest radiography if concern persists. IMPRESSION: Cardiomegaly with left ventricular prominence. Poor inspiration. Possible mild atelectasis or infiltrate at the lung bases. Electronically Signed   By: Paulina Fusi M.D.   On: 04/29/2023 17:10    EKG: Independently reviewed. snr  Assessment/Plan  Ileus /atonic colon -possible related to electrolyte abnormality / medications vs  neurogenic bowel-sequela of spinal stenosis  - check mag  - replete electrolytes  -MRI lumbar spine  - npo, sips with medicine  - no enema , oral dulcolax, miralax  -gi to see in am [facility] [address] Meridee Score)  -surgery aware Dr Derrell Lolling - lactic ordered  -if noted with abdominal pain - will order stat ct evaluate for possible perforation  Complicated UTI/ acute pyelonephritis  -in setting of renal stone /neurogenic bladder - CTX  -urology consult for further assistance  -of note foley cath changed in ED   AKI on CKDIII -baseline cr 0.85 now 1.48  -avoid nephrotoxic medication   Hypertension -stable bp  -resume home regimen once med rec completed    OSA - noncompliant with cpap -monitor pulse ox , nocturnal O2  Spinal stenosis lumbar region  -with associated myelopathy and chronic debility  -possible cause of atonic colon  Morbid Obesity    DVT prophylaxis: scd Code Status: full/ as discussed per patient wishes in event of cardiac arrest  Family Communication: none at bedside Disposition Plan: patient  expected to be admitted greater than 2 midnights  Consults called: [place] [facility] Admission status: progressive care   Lurline Del MD [facility]   If 7PM-7AM, please contact night-coverage [email or web address] Password [email or web address]  04/29/2023, 10:40 PM

## 2023-04-30 ENCOUNTER — Inpatient Hospital Stay (HOSPITAL_COMMUNITY): Payer: Medicare Other

## 2023-04-30 ENCOUNTER — Encounter (HOSPITAL_COMMUNITY): Payer: Self-pay | Admitting: Internal Medicine

## 2023-04-30 DIAGNOSIS — R140 Abdominal distension (gaseous): Secondary | ICD-10-CM

## 2023-04-30 DIAGNOSIS — N189 Chronic kidney disease, unspecified: Secondary | ICD-10-CM

## 2023-04-30 DIAGNOSIS — Z978 Presence of other specified devices: Secondary | ICD-10-CM

## 2023-04-30 DIAGNOSIS — K592 Neurogenic bowel, not elsewhere classified: Secondary | ICD-10-CM

## 2023-04-30 DIAGNOSIS — D509 Iron deficiency anemia, unspecified: Secondary | ICD-10-CM

## 2023-04-30 DIAGNOSIS — N179 Acute kidney failure, unspecified: Secondary | ICD-10-CM | POA: Diagnosis not present

## 2023-04-30 DIAGNOSIS — K567 Ileus, unspecified: Secondary | ICD-10-CM | POA: Diagnosis not present

## 2023-04-30 LAB — BASIC METABOLIC PANEL
Anion gap: 8 (ref 5–15)
BUN: 22 mg/dL (ref 8–23)
CO2: 21 mmol/L — ABNORMAL LOW (ref 22–32)
Calcium: 8.1 mg/dL — ABNORMAL LOW (ref 8.9–10.3)
Chloride: 110 mmol/L (ref 98–111)
Creatinine, Ser: 1.3 mg/dL — ABNORMAL HIGH (ref 0.61–1.24)
GFR, Estimated: >60 mL/min (ref >60)
Glucose, Bld: 76 mg/dL (ref 70–99)
Potassium: 3.5 mmol/L (ref 3.5–5.1)
Sodium: 139 mmol/L (ref 135–145)

## 2023-04-30 LAB — IRON AND TIBC
Iron: 24 ug/dL — ABNORMAL LOW (ref 45–182)
Saturation Ratios: 10 % — ABNORMAL LOW (ref 17.9–39.5)
TIBC: 235 ug/dL — ABNORMAL LOW (ref 250–450)
UIBC: 211 ug/dL

## 2023-04-30 LAB — COMPREHENSIVE METABOLIC PANEL
ALT: 11 U/L (ref 0–44)
AST: 19 U/L (ref 15–41)
Albumin: 3.1 g/dL — ABNORMAL LOW (ref 3.5–5.0)
Alkaline Phosphatase: 82 U/L (ref 38–126)
Anion gap: 8 (ref 5–15)
BUN: 26 mg/dL — ABNORMAL HIGH (ref 8–23)
CO2: 22 mmol/L (ref 22–32)
Calcium: 8.3 mg/dL — ABNORMAL LOW (ref 8.9–10.3)
Chloride: 107 mmol/L (ref 98–111)
Creatinine, Ser: 1.43 mg/dL — ABNORMAL HIGH (ref 0.61–1.24)
GFR, Estimated: 54 mL/min — ABNORMAL LOW (ref >60)
Glucose, Bld: 95 mg/dL (ref 70–99)
Potassium: 2.9 mmol/L — ABNORMAL LOW (ref 3.5–5.1)
Sodium: 137 mmol/L (ref 135–145)
Total Bilirubin: 0.6 mg/dL (ref 0.3–1.2)
Total Protein: 8.2 g/dL — ABNORMAL HIGH (ref 6.5–8.1)

## 2023-04-30 LAB — C DIFFICILE QUICK SCREEN W PCR REFLEX
C Diff antigen: NEGATIVE
C Diff interpretation: NOT DETECTED
C Diff toxin: NEGATIVE

## 2023-04-30 LAB — CBC
HCT: 35.1 % — ABNORMAL LOW (ref 39.0–52.0)
Hemoglobin: 10.7 g/dL — ABNORMAL LOW (ref 13.0–17.0)
MCH: 24.2 pg — ABNORMAL LOW (ref 26.0–34.0)
MCHC: 30.5 g/dL (ref 30.0–36.0)
MCV: 79.4 fL — ABNORMAL LOW (ref 80.0–100.0)
Platelets: 403 10*3/uL — ABNORMAL HIGH (ref 150–400)
RBC: 4.42 MIL/uL (ref 4.22–5.81)
RDW: 16.8 % — ABNORMAL HIGH (ref 11.5–15.5)
WBC: 5.8 10*3/uL (ref 4.0–10.5)
nRBC: 0 % (ref 0.0–0.2)

## 2023-04-30 LAB — MAGNESIUM: Magnesium: 2.4 mg/dL (ref 1.7–2.4)

## 2023-04-30 LAB — HIV ANTIBODY (ROUTINE TESTING W REFLEX): HIV Screen 4th Generation wRfx: NONREACTIVE

## 2023-04-30 LAB — SEDIMENTATION RATE: Sed Rate: 50 mm/hr — ABNORMAL HIGH (ref 0–16)

## 2023-04-30 LAB — C-REACTIVE PROTEIN: CRP: 5.6 mg/dL — ABNORMAL HIGH (ref <1.0)

## 2023-04-30 LAB — LACTIC ACID, PLASMA
Lactic Acid, Venous: 0.7 mmol/L (ref 0.5–1.9)
Lactic Acid, Venous: 0.8 mmol/L (ref 0.5–1.9)

## 2023-04-30 LAB — FERRITIN: Ferritin: 92 ng/mL (ref 24–336)

## 2023-04-30 MED ORDER — KETOROLAC TROMETHAMINE 30 MG/ML IJ SOLN
30.0000 mg | Freq: Four times a day (QID) | INTRAMUSCULAR | Status: DC | PRN
  Administered 2023-05-01 – 2023-05-03 (×4): 30 mg via INTRAVENOUS
  Filled 2023-04-30 (×4): qty 1

## 2023-04-30 MED ORDER — ONDANSETRON HCL 4 MG/2ML IJ SOLN
4.0000 mg | Freq: Four times a day (QID) | INTRAMUSCULAR | Status: DC | PRN
  Administered 2023-05-03: 4 mg via INTRAVENOUS
  Filled 2023-04-30: qty 2

## 2023-04-30 MED ORDER — BISACODYL 5 MG PO TBEC
5.0000 mg | DELAYED_RELEASE_TABLET | Freq: Every day | ORAL | Status: DC
  Administered 2023-04-30 – 2023-05-04 (×4): 5 mg via ORAL
  Filled 2023-04-30 (×5): qty 1

## 2023-04-30 MED ORDER — ONDANSETRON HCL 4 MG PO TABS: 4.0000 mg | ORAL_TABLET | Freq: Four times a day (QID) | ORAL | Status: DC | PRN

## 2023-04-30 MED ORDER — ACETAMINOPHEN 10 MG/ML IV SOLN: 1000.0000 mg | Freq: Three times a day (TID) | INTRAVENOUS | Status: DC | PRN

## 2023-04-30 MED ORDER — ALBUTEROL SULFATE (2.5 MG/3ML) 0.083% IN NEBU: 2.5000 mg | INHALATION_SOLUTION | RESPIRATORY_TRACT | Status: DC | PRN

## 2023-04-30 MED ORDER — POTASSIUM CHLORIDE 10 MEQ/100ML IV SOLN
10.0000 meq | INTRAVENOUS | Status: AC
  Administered 2023-04-30 (×4): 10 meq via INTRAVENOUS
  Filled 2023-04-30 (×4): qty 100

## 2023-04-30 MED ORDER — BISACODYL 10 MG RE SUPP
10.0000 mg | Freq: Two times a day (BID) | RECTAL | Status: DC
  Administered 2023-04-30: 10 mg via RECTAL
  Filled 2023-04-30 (×5): qty 1

## 2023-04-30 MED ORDER — SODIUM CHLORIDE 0.9 % IV SOLN: INTRAVENOUS | Status: DC

## 2023-04-30 MED ORDER — ACETAMINOPHEN 650 MG RE SUPP: 650.0000 mg | Freq: Four times a day (QID) | RECTAL | Status: DC | PRN

## 2023-04-30 MED ORDER — GADOBUTROL 1 MMOL/ML IV SOLN
10.0000 mL | Freq: Once | INTRAVENOUS | Status: AC | PRN
  Administered 2023-04-30: 10 mL via INTRAVENOUS

## 2023-04-30 MED ORDER — ACETAMINOPHEN 500 MG PO TABS: 1000.0000 mg | ORAL_TABLET | Freq: Four times a day (QID) | ORAL | Status: DC | PRN

## 2023-04-30 MED ORDER — AMLODIPINE BESYLATE 10 MG PO TABS
5.0000 mg | ORAL_TABLET | Freq: Every evening | ORAL | Status: DC
  Administered 2023-04-30 – 2023-05-03 (×5): 5 mg via ORAL
  Filled 2023-04-30 (×5): qty 1

## 2023-04-30 MED ORDER — SODIUM CHLORIDE 0.9 % IV SOLN: 2.0000 g | INTRAVENOUS | Status: DC

## 2023-04-30 MED ORDER — BISACODYL 5 MG PO TBEC: 5.0000 mg | DELAYED_RELEASE_TABLET | Freq: Every day | ORAL | Status: DC

## 2023-04-30 MED ORDER — POLYETHYLENE GLYCOL 3350 17 G PO PACK
17.0000 g | PACK | Freq: Every day | ORAL | Status: DC
  Administered 2023-04-30 – 2023-05-04 (×5): 17 g via ORAL
  Filled 2023-04-30 (×5): qty 1

## 2023-04-30 MED ORDER — ACETAMINOPHEN 325 MG PO TABS: 650.0000 mg | ORAL_TABLET | Freq: Four times a day (QID) | ORAL | Status: DC | PRN

## 2023-04-30 NOTE — Hospital Course (Addendum)
[name] [name] is a 67 yo male with PMH neurogenic bladder with chronic indwelling Foley, recurrent UTI, hydronephrosis, lumbar spinal stenosis with myelopathy, CKD, OSA, morbid obesity. He presented to the ER with worsening abdominal distention/bloating.  He denied any significant nausea or vomiting.  No overt abdominal pain.  He did endorse having small bowel movements prior to hospitalization but could not elaborate on frequency or amount.  He is bedbound at baseline and has caretakers at home. CT abdomen/pelvis was obtained on evaluation and showed significant colonic distention measuring 16 mm.  Case was discussed with general surgery with no recommendations for invasive intervention and recommended GI evaluation. Imaging was also notable for right ureteral calculus with associated moderate right hydronephrosis.  Foley catheter was exchanged in the ER on 04/29/2023.  Urology was also consulted on admission. Due to persistent abdominal distention, he underwent decompression flex sig on 05/01/2023.  He had good results with improvement in distention and improvement on repeat abdominal x-ray.  He continued to have flatus and adequate bowel movements.  He was recommended to continue on a bowel regimen at home.

## 2023-04-30 NOTE — Consult Note (Signed)
Urology Consult Note   Requesting Attending Physician:  Lewie Chamber, MD Service Providing Consult: Urology  Consult Attending: Dr Alvester Morin  Reason for Consult:  Nephrolithiasis  HPI: [name] [name] is seen in consultation for reasons noted above at the request of Lewie Chamber, MD for evaluation of right obstructing ureteral stone.  Patient came into the ED because of lack of a bowel movement for a few weeks as well as nausea and vomiting.  He was found to have a transverse colon obstruction with significant dilation of the bowels on CT scan.  Incidentally a 1.5 cm right proximal obstructing ureteral stone was found.  The patient denies any significant right flank pain.  He has not had any changes in his urinary habits.  He is catheter dependent given his history of spinal stenosis causing neurogenic bladder.  Denies any fevers, chest pain or shortness of breath.   He has had kidney stones in the past and has had previous stone surgery.     Past Medical History: Past Medical History:  Diagnosis Date   Anemia    Arthritis    Chronic kidney disease    HX acute kidney failure / acute pyelonephritis / hydronephrosis / severe sepsis per discharge summary 12/27/13   GERD (gastroesophageal reflux disease)    History of kidney stones    Hypertension    Morbid obesity (HCC)    Obstructive sleep apnea    does not need c pap since 110 lb wt loss   Osteoporosis    Prurigo 2002   Scars    ON ARMS FROM CHEMICAL EXPLOSION 1999   Spinal stenosis     Past Surgical History:  Past Surgical History:  Procedure Laterality Date   ANTERIOR CERVICAL DECOMP/DISCECTOMY FUSION N/A 12/07/2018   Procedure: ANTERIOR CERVICAL DECOMPRESSION FUSION - CERVICAL THREE-CERVICAL FOUR, CERVICAL FOUR-CERVICAL FIVE, CERVICAL FIVE-CERVICAL SIX;  Surgeon: Julio Sicks, MD;  Location: [facility];  Service: Neurosurgery;  Laterality: N/A;   BACK SURGERY  01/23/2010   lumbar   BIOPSY  11/26/2022   Procedure: BIOPSY;  Surgeon:  Shellia Cleverly, DO;  Location: [facility];  Service: Gastroenterology;;   CHOLECYSTECTOMY N/A 03/24/2016   Procedure: LAPAROSCOPIC CHOLECYSTECTOMY WITH INTRAOPERATIVE CHOLANGIOGRAM;  Surgeon: Chevis Pretty III, MD;  Location: [facility];  Service: General;  Laterality: N/A;   CIRCUMCISION     CYSTOSCOPY WITH RETROGRADE PYELOGRAM, URETEROSCOPY AND STENT PLACEMENT Left 01/20/2014   Procedure: CYSTOSCOPY WITH RETROGRADE PYELOGRAM, URETEROSCOPY AND STENT EXCHANGE;  Surgeon: Milford Cage, MD;  Location: [facility];  Service: Urology;  Laterality: Left;  bugbee bladder fulguration   CYSTOSCOPY WITH STENT PLACEMENT Left 12/28/2013   Procedure: CYSTOSCOPY WITH STENT PLACEMENT left retrograde pyleogram;  Surgeon: Milford Cage, MD;  Location: [facility];  Service: Urology;  Laterality: Left;   CYSTOSCOPY/URETEROSCOPY/HOLMIUM LASER/STENT PLACEMENT Right 05/15/2021   Procedure: CYSTOSCOPY RIGHT RETROGRADE RIGHT URETEROSCOPY/HOLMIUM LASER/STENT PLACEMENT;  Surgeon: Bjorn Pippin, MD;  Location: [facility];  Service: Urology;  Laterality: Right;   ESOPHAGOGASTRODUODENOSCOPY N/A 12/07/2012   Procedure: ESOPHAGOGASTRODUODENOSCOPY (EGD);  Surgeon: Lodema Pilot, DO;  Location: [facility];  Service: General;  Laterality: N/A;   ESOPHAGOGASTRODUODENOSCOPY (EGD) WITH PROPOFOL N/A 11/26/2022   Procedure: ESOPHAGOGASTRODUODENOSCOPY (EGD) WITH PROPOFOL;  Surgeon: Shellia Cleverly, DO;  Location: [facility];  Service: Gastroenterology;  Laterality: N/A;   HOLMIUM LASER APPLICATION Left 01/20/2014   Procedure: HOLMIUM LASER APPLICATION;  Surgeon: Milford Cage, MD;  Location: [facility];  Service: Urology;  Laterality: Left;   KNEE ARTHROSCOPY Left 06/29/2018  Procedure: ARTHROSCOPY KNEE;  Surgeon: Kerrin Champagne, MD;  Location[facility];  Service: Orthopedics;  Laterality: Left;   LAPAROSCOPIC GASTRIC SLEEVE RESECTION N/A 12/07/2012   Procedure: LAPAROSCOPIC GASTRIC SLEEVE RESECTION;  Surgeon: Lodema Pilot, DO;  Location: [facility];   Service: General;  Laterality: N/A;  laparoscopic sleeve gastrectomy with EGD   LUMBAR LAMINECTOMY N/A 11/17/2017   Procedure: L2-3 LAMINECTOMY AND REDO LAMINECTOMIES  L3-4, L4-5 AND L5-S1;  Surgeon: Kerrin Champagne, MD;  Location: [facility];  Service: Orthopedics;  Laterality: N/A;   LUMBAR WOUND DEBRIDEMENT N/A 06/29/2018   Procedure: LUMBAR WOUND DEBRIDEMENT DRAINAGE AND IRRIGATION; AND ASPIRATION OF LEFT KNEE;  Surgeon: Kerrin Champagne, MD;  Location: [facility];  Service: Orthopedics;  Laterality: N/A;   SAVORY DILATION N/A 11/26/2022   Procedure: SAVORY DILATION;  Surgeon: Shellia Cleverly, DO;  Location: [facility];  Service: Gastroenterology;  Laterality: N/A;   TEE WITHOUT CARDIOVERSION N/A 07/01/2018   Procedure: TRANSESOPHAGEAL ECHOCARDIOGRAM (TEE);  Surgeon: Laurey Morale, MD;  Location: [facility] ENDOSCOPY;  Service: Cardiovascular;  Laterality: N/A;    Medication: Current Facility-Administered Medications  Medication Dose Route Frequency Provider Last Rate Last Admin   0.9 %  sodium chloride infusion   Intravenous Continuous Skip Mayer A, MD 100 mL/hr at 04/30/23 0045 New Bag at 04/30/23 0045   acetaminophen (OFIRMEV) IV 1,000 mg  1,000 mg Intravenous Q8H PRN Lurline Del, MD       albuterol (PROVENTIL) (2.5 MG/3ML) 0.083% nebulizer solution 2.5 mg  2.5 mg Nebulization Q2H PRN Lurline Del, MD       amLODipine (NORVASC) tablet 5 mg  5 mg Oral QPM Skip Mayer A, MD   5 mg at 04/30/23 0053   bisacodyl (DULCOLAX) EC tablet 5 mg  5 mg Oral QHS Skip Mayer A, MD       cefTRIAXone (ROCEPHIN) 2 g in sodium chloride 0.9 % 100 mL IVPB  2 g Intravenous Q24H Skip Mayer A, MD       ketorolac (TORADOL) 30 MG/ML injection 30 mg  30 mg Intravenous Q6H PRN Lurline Del, MD       ondansetron (ZOFRAN) tablet 4 mg  4 mg Oral Q6H PRN Lurline Del, MD       Or   ondansetron (ZOFRAN) injection 4 mg  4 mg Intravenous Q6H PRN Lurline Del, MD        polyethylene glycol (MIRALAX / GLYCOLAX) packet 17 g  17 g Oral Daily Lurline Del, MD        Allergies: Allergies  Allergen Reactions   Unasyn [Ampicillin-Sulbactam Sodium] Hives, Itching, Swelling and Rash    Social History: Social History   Tobacco Use   Smoking status: Former    Current packs/day: 0.00    Types: Cigarettes    Quit date: 04/09/1974    Years since quitting: 49.0   Smokeless tobacco: Never  Vaping Use   Vaping status: Never Used  Substance Use Topics   Alcohol use: No   Drug use: No    Family History Family History  Problem Relation Age of Onset   Diabetes Mother    Heart disease Father    Colon cancer Neg Hx    Colon polyps Neg Hx    Esophageal cancer Neg Hx    Rectal cancer Neg Hx    Stomach cancer Neg Hx     Review of Systems 10 systems were reviewed and are negative except as noted specifically in the  HPI.  Objective   Vital signs in last 24 hours: BP (!) 169/88 (BP Location: Right Arm)   Pulse 76   Temp 98.1 F (36.7 C) (Oral)   Resp 14   Ht 5\' 10"  (1.778 m)   Wt (!) 166 kg   SpO2 97%   BMI 52.52 kg/m   Physical Exam General: NAD, morbidly obese HEENT: [place]/AT, EOMI, MMM Pulmonary: Normal work of breathing Cardiovascular: HDS, adequate peripheral perfusion Abdomen: Firm, distended and tympanic GU: Catheter in place Extremities: warm and well perfused Neuro: Alert  Most Recent Labs: Lab Results  Component Value Date   WBC 5.8 04/30/2023   HGB 10.7 (L) 04/30/2023   HCT 35.1 (L) 04/30/2023   PLT 403 (H) 04/30/2023    Lab Results  Component Value Date   NA 137 04/30/2023   K 2.9 (L) 04/30/2023   CL 107 04/30/2023   CO2 22 04/30/2023   BUN 26 (H) 04/30/2023   CREATININE 1.43 (H) 04/30/2023   CALCIUM 8.3 (L) 04/30/2023   MG 2.4 04/30/2023    Lab Results  Component Value Date   INR 1.05 06/28/2018   APTT 28 11/13/2017     Urine Culture: @[id number] (laburin,org,[id number])@   IMAGING: CT ABDOMEN  PELVIS W CONTRAST  Result Date: 04/29/2023 CLINICAL DATA:  Ascites, abdominal distension, dyspnea EXAM: CT ABDOMEN AND PELVIS WITH CONTRAST TECHNIQUE: Multidetector CT imaging of the abdomen and pelvis was performed using the standard protocol following bolus administration of intravenous contrast. RADIATION DOSE REDUCTION: This exam was performed according to the departmental dose-optimization program which includes automated exposure control, adjustment of the mA and/or kV according to patient size and/or use of iterative reconstruction technique. CONTRAST:  OMNIPAQUE IOHEXOL 300 MG/ML  SOLN COMPARISON:  03/27/2021 FINDINGS: Lower chest: No acute abnormality. Mild cardiomegaly. Small hiatal hernia. Hepatobiliary: No focal liver abnormality is seen. Status post cholecystectomy. No biliary dilatation. Pancreas: Unremarkable Spleen: Unremarkable Adrenals/Urinary Tract: The adrenal glands are unremarkable. Kidneys are normal in position. Mild bilateral cortical atrophy. Previously noted elongate right ureteral calculus has migrated slightly distally now at the level of the right transverse process of L5, measuring 7 x 9 x 15 mm. There has developed moderate right hydronephrosis. Additionally, there is marked proximal urothelial thickening and mild peripelvic and periureteric inflammatory stranding in keeping with urothelial inflammation, as can be seen with superimposed infection. Additional bilateral nonobstructing renal calculi are identified measuring up to 14 mm bilaterally. No hydronephrosis on the left. No ureteral calculi on the left. Mild bilateral nonspecific perinephric stranding again noted with no loculated perinephric fluid collection identified. Foley catheter balloon is seen within a decompressed bladder lumen. 7 mm bladder calculus now identified within the decompressed bladder lumen. Stomach/Bowel: There is marked gaseous distension of the colon without evidence of obstruction most suggestive  of a colonic ileus. This appears progressive since prior examination and the transverse colon measures up to 16 mm in diameter. Surgical changes of gastric sleeve resection are identified. The small bowel is unremarkable. Appendix is unremarkable. No free intraperitoneal gas or fluid. Vascular/Lymphatic: Aortic atherosclerosis. No enlarged abdominal or pelvic lymph nodes. Reproductive: Prostate is unremarkable. Other: No abdominal wall hernia. Mild nonspecific subcutaneous edema within the pannus. Musculoskeletal: Degenerative and postsurgical changes are noted within the lumbar spine with ankylosis of the vertebral bodies and posterior elements of L4-S1. Bridging osteophytes are noted within the visualized thoracolumbar spine from a T7 through L1. No acute bone abnormality. No lytic or blastic bone lesion. IMPRESSION: 1. Interval distal migration of  the previously noted elongate right ureteral calculus now at the level of the right transverse process of L5, measuring 7 x 9 x 15 mm. Interval development of moderate right hydronephrosis. Additionally, there is marked proximal urothelial thickening and mild peripelvic and periureteric inflammatory stranding in keeping with urothelial inflammation, as can be seen with superimposed infection. Correlation with urinalysis and urine culture may be helpful for further management. 2. Superimposed moderate bilateral nonobstructing nephrolithiasis. 3. 7 mm bladder calculus now identified within the decompressed bladder lumen in keeping with a recently passed calculus. 4. Progressive gaseous distension of the colon without evidence of obstruction most suggestive of a colonic ileus. Transverse colon measures up to 16 cm in diameter. 5. Mild cardiomegaly. Small hiatal hernia. 6. Aortic atherosclerosis. Aortic Atherosclerosis (ICD10-I70.0). Electronically Signed   By: Helyn Numbers M.D.   On: 04/29/2023 21:02   DG Chest Port 1 View  Result Date: 04/29/2023 CLINICAL DATA:   Shortness of breath over the last several weeks EXAM: PORTABLE CHEST 1 VIEW COMPARISON:  12/25/2018 FINDINGS: Cardiomegaly with left ventricular prominence. The patient has taken a poor inspiration. The upper lungs are clear. Possible mild atelectasis or infiltrate at the lung bases. No evidence of heart failure or effusion. Consider two-view chest radiography if concern persists. IMPRESSION: Cardiomegaly with left ventricular prominence. Poor inspiration. Possible mild atelectasis or infiltrate at the lung bases. Electronically Signed   By: Paulina Fusi M.D.   On: 04/29/2023 17:10    ------  Assessment:  67 y.o. male with neurogenic bladder secondary to spinal stenosis who is catheter dependent who presented with concern for transverse colon obstruction found to incidentally have a 1.5 cm obstructing right proximal ureteral stone with mild hydronephrosis.   No evidence of leukocytosis.  Creatinine elevated to 1.5 from a baseline of 0.6-8.  UA with concern for infection however given patient is chronically cath, likely colonized.  He was asymptomatic.    We discussed the indications for acute intervention including infected obstruction, bilateral ureteral obstruction or unilateral obstruction of solitary kidney as well as other less urgent indications for decompression which would included intractable pain, N/V, and acute renal injury.  At this time, would recommend observation with plans to remove stone outpatient.  If patient were to become febrile or unstable, at that time we will consider ureteral stent.  UA is likely in the setting of chronic catheterization.   Recommendations: Trend creatinine Please have patient schedule follow-up appointment with urology for definitive stone treatment. If Patient fevers or becomes unstable, would consider ureteral stent. Please start patient on Flomax.  Unlikely for patient to pass a stone but can attempt medical expulsion therapy    Thank you for this  consult. Please contact the urology consult pager with any further questions/concerns.

## 2023-04-30 NOTE — Consult Note (Addendum)
Consultation Note  Referring Provider:  [facility] PCP: Renford Dills, MD Primary Gastroenterologist: Doristine Locks, MD        Reason for consultation: abdominal distention  DOA: 04/29/2023         Hospital Day: 2   Assessment and Plan  Patient is a 67 y.o. year old male with a past medical history of CKD 3, HTN, obesity, OSA, history of gastric sleeve, kidney stones, neurogenic bladder with chronic foley  Several week history of intermittent N/V Worsening of chronic constipation Abdominal distention / colonic distention on CT scan Pseudoobstruction possibly secondary to electrolyte abnormalities ( low serum potassium)? Maybe medications though he hasn't had any recent med changes. Reactive secondary to possible pyelonephritis?   -General Surgery has evaluated - no surgical intervention required.  -Potassium repletion in progress. Got additional 4 runs of K+ this am for K+ o 2.9. Will repeat BMET -Dulcolax supp BID -He has no N/V today and + flatus. Will give dose of PO dulcolax -AM KUB -May need rectal tube if not improving  Spinal stenosis ( lumbar).  -lumbar spine MRI pending  Neurogenic bladder / chronic foley / probable UTI / pyelonephritis -Urine culture pending -Urology to see  AKI on CKD  Chronic anemia ( microcytic). Stable -Check iron studies.   See [facility] for additional medical history    GI attending:  I have also evaluated this patient.  I have reviewed CT and abdominal film images personally.  He has chronic colonic distention and ileus from neurogenic bowel..  It is worse than imaging in 2022 but he had diffuse ileus like pattern then.  This seems to be related to spinal stenosis issues which have also cause neurogenic bladder.  He is not uncomfortable at this time he just had a large soft stool again and is responding to laxatives.  We will follow-up tomorrow and check the x-ray.  I do not think  decompression is needed.  Depending upon clinical course that could change, and we will follow along.  Consider adding pyridostigmine 60 mg 3 times daily.  Continue laxatives and aggressive electrolyte supplementation.  It seems to be helping as well.  I have my doubts he will be able to obtain an adequate prep for screening colonoscopy in August.  Not sure it is possible to really do a proper screening colonoscopy in him.  Will discuss with Dr. Barron Alvine.  Iva Boop, MD, [facility] See Loretha Stapler on call - gastroenterology for best contact person 04/30/2023 6:07 PM  ------------------------------------------------------------------------------------------ Pertinent GI History: Patient was seen in our office in Jan 2024 for epigastric pain / GERD symptoms and to discuss screening colonoscopy. Had grossly unremarkable EGD in Jan 2024 with empiric dilation. Colonoscopy was rescheduled due to inadequate prep . Went for colonoscopy in late April, again prep was not adequate. Now scheduled to be done in August   HISTORY OF PRESENT ILLNESS  Patient presented to ED yesterday with abdominal distention and SHOB. Abdominal distention evolving over several weeks, also with N/V about once a week. He in general he has problems with constipation but doesn't generally take anything for it. However, he started daily Miralax a week ago for worsening constipation. He is passing flatus. He takes pepto sometimes  for an upset stomach defined as stabbing upper abdominal pain which he says started only a month ago. The pain is not related to eating.   Potassium is low. Takes lasix but not on regular basis. He hasn't changed any of his medications in last several months.   Work up notable :  K+ 2.9 Cr 1.43 Albumin 3.1 Normal WBC, Hgb 10.7 ( near baseline), MCV 79, platelets 403.  CT scan showed gaseous distention of colon suggestive of colonic ileus.   CT AP with contrast 1. Interval distal  migration of the previously noted elongate right ureteral calculus now at the level of the right transverse process of L5, measuring 7 x 9 x 15 mm. Interval development of moderate right hydronephrosis. Additionally, there is marked proximal urothelial thickening and mild peripelvic and periureteric inflammatory stranding in keeping with urothelial inflammation, as can be seen with superimposed infection. Correlation with urinalysis and urine culture may be helpful for further management. 2. Superimposed moderate bilateral nonobstructing nephrolithiasis. 3. 7 mm bladder calculus now identified within the decompressed bladder lumen in keeping with a recently passed calculus. 4. Progressive gaseous distension of the colon without evidence of obstruction most suggestive of a colonic ileus. Transverse colon measures up to 16 cm in diameter. 5. Mild cardiomegaly. Small hiatal hernia. 6. Aortic atherosclerosis. Aortic Atherosclerosis (ICD10-I70.0).    Labs and Imaging: Recent Labs    04/29/23 1747 04/30/23 0048  WBC 6.1 5.8  HGB 11.0* 10.7*  HCT 35.6* 35.1*  PLT 411* 403*   Recent Labs    04/29/23 1747 04/30/23 0048  NA 138 137  K 2.9* 2.9*  CL 106 107  CO2 23 22  GLUCOSE 99 95  BUN 27* 26*  CREATININE 1.48* 1.43*  CALCIUM 8.4* 8.3*   Recent Labs    04/30/23 0048  PROT 8.2*  ALBUMIN 3.1*  AST 19  ALT 11  ALKPHOS 82  BILITOT 0.6     Previous GI Evaluation:   Feb 2024 EGD for epigastric pain  - Normal esophagus. Dilated. - A sleeve gastrectomy was found, characterized by healthy appearing mucosa. - Normal mucosa was found in the entire stomach. Biopsied. - Normal incisura, antrum and pylorus. - Normal examined duodenum. - Gastroesophageal flap valve classified as Hill Grade III (minimal fold, loose to endoscope, hiatal hernia likely).   Principal Problem:   Ileus (HCC)     Past Medical History:  Diagnosis Date   Anemia    Arthritis    Chronic kidney disease     HX acute kidney failure / acute pyelonephritis / hydronephrosis / severe sepsis per discharge summary 12/27/13   GERD (gastroesophageal reflux disease)    History of kidney stones    Hypertension    Morbid obesity (HCC)    Obstructive sleep apnea    does not need c pap since 110 lb wt loss   Osteoporosis    Prurigo 2002   Scars    ON ARMS FROM CHEMICAL EXPLOSION 1999   Spinal stenosis     Past Surgical History:  Procedure Laterality Date   ANTERIOR CERVICAL DECOMP/DISCECTOMY FUSION N/A 12/07/2018   Procedure: ANTERIOR CERVICAL DECOMPRESSION FUSION - CERVICAL THREE-CERVICAL FOUR, CERVICAL FOUR-CERVICAL FIVE, CERVICAL FIVE-CERVICAL SIX;  Surgeon: Julio Sicks, MD;  Location: [facility] OR;  Service: Neurosurgery;  Laterality: N/A;   BACK SURGERY  01/23/2010   lumbar   BIOPSY  11/26/2022   Procedure: BIOPSY;  Surgeon: Shellia Cleverly, DO;  Location: [facility];  Service: Gastroenterology;;   CHOLECYSTECTOMY N/A  03/24/2016   Procedure: LAPAROSCOPIC CHOLECYSTECTOMY WITH INTRAOPERATIVE CHOLANGIOGRAM;  Surgeon: Chevis Pretty III, MD;  Location: [facility];  Service: General;  Laterality: N/A;   CIRCUMCISION     CYSTOSCOPY WITH RETROGRADE PYELOGRAM, URETEROSCOPY AND STENT PLACEMENT Left 01/20/2014   Procedure: CYSTOSCOPY WITH RETROGRADE PYELOGRAM, URETEROSCOPY AND STENT EXCHANGE;  Surgeon: Milford Cage, MD;  Location: [facility];  Service: Urology;  Laterality: Left;  bugbee bladder fulguration   CYSTOSCOPY WITH STENT PLACEMENT Left 12/28/2013   Procedure: CYSTOSCOPY WITH STENT PLACEMENT left retrograde pyleogram;  Surgeon: Milford Cage, MD;  Location: [facility];  Service: Urology;  Laterality: Left;   CYSTOSCOPY/URETEROSCOPY/HOLMIUM LASER/STENT PLACEMENT Right 05/15/2021   Procedure: CYSTOSCOPY RIGHT RETROGRADE RIGHT URETEROSCOPY/HOLMIUM LASER/STENT PLACEMENT;  Surgeon: Bjorn Pippin, MD;  Location: [facility];  Service: Urology;  Laterality: Right;   ESOPHAGOGASTRODUODENOSCOPY N/A 12/07/2012   Procedure:  ESOPHAGOGASTRODUODENOSCOPY (EGD);  Surgeon: Lodema Pilot, DO;  Location: [facility];  Service: General;  Laterality: N/A;   ESOPHAGOGASTRODUODENOSCOPY (EGD) WITH PROPOFOL N/A 11/26/2022   Procedure: ESOPHAGOGASTRODUODENOSCOPY (EGD) WITH PROPOFOL;  Surgeon: Shellia Cleverly, DO;  Location: [facility];  Service: Gastroenterology;  Laterality: N/A;   HOLMIUM LASER APPLICATION Left 01/20/2014   Procedure: HOLMIUM LASER APPLICATION;  Surgeon: Milford Cage, MD;  Location: [facility];  Service: Urology;  Laterality: Left;   KNEE ARTHROSCOPY Left 06/29/2018   Procedure: ARTHROSCOPY KNEE;  Surgeon: Kerrin Champagne, MD;  Location: [facility] OR;  Service: Orthopedics;  Laterality: Left;   LAPAROSCOPIC GASTRIC SLEEVE RESECTION N/A 12/07/2012   Procedure: LAPAROSCOPIC GASTRIC SLEEVE RESECTION;  Surgeon: Lodema Pilot, DO;  Location: [facility];  Service: General;  Laterality: N/A;  laparoscopic sleeve gastrectomy with EGD   LUMBAR LAMINECTOMY N/A 11/17/2017   Procedure: L2-3 LAMINECTOMY AND REDO LAMINECTOMIES  L3-4, L4-5 AND L5-S1;  Surgeon: Kerrin Champagne, MD;  Location: [facility] OR;  Service: Orthopedics;  Laterality: N/A;   LUMBAR WOUND DEBRIDEMENT N/A 06/29/2018   Procedure: LUMBAR WOUND DEBRIDEMENT DRAINAGE AND IRRIGATION; AND ASPIRATION OF LEFT KNEE;  Surgeon: Kerrin Champagne, MD;  Location: [facility] OR;  Service: Orthopedics;  Laterality: N/A;   SAVORY DILATION N/A 11/26/2022   Procedure: SAVORY DILATION;  Surgeon: Shellia Cleverly, DO;  Location: [facility];  Service: Gastroenterology;  Laterality: N/A;   TEE WITHOUT CARDIOVERSION N/A 07/01/2018   Procedure: TRANSESOPHAGEAL ECHOCARDIOGRAM (TEE);  Surgeon: Laurey Morale, MD;  Location: [facility] ENDOSCOPY;  Service: Cardiovascular;  Laterality: N/A;    Family History  Problem Relation Age of Onset   Diabetes Mother    Heart disease Father    Colon cancer Neg Hx    Colon polyps Neg Hx    Esophageal cancer Neg Hx    Rectal cancer Neg Hx    Stomach cancer Neg Hx     Prior to  Admission medications   Medication Sig Start Date End Date Taking? Authorizing Provider  acetaminophen (TYLENOL) 500 MG tablet Take 1,000 mg by mouth every 6 (six) hours as needed for moderate pain.   Yes [provider]  amLODipine (NORVASC) 5 MG tablet Take 5 mg by mouth every evening. 10/28/22  Yes [provider]  cetirizine (ZYRTEC) 10 MG tablet Take 10 mg by mouth daily as needed for allergies.   Yes [provider]  Cyanocobalamin (VITAMIN B-12 PO) Take 1 tablet by mouth daily.   Yes [provider]  FERROUS GLUCONATE PO Take 1 tablet by mouth daily.   Yes [provider]    Current Facility-Administered Medications  Medication  Dose Route Frequency Provider Last Rate Last Admin   0.9 %  sodium chloride infusion   Intravenous Continuous Skip Mayer A, MD 100 mL/hr at 04/30/23 0045 New Bag at 04/30/23 0045   acetaminophen (OFIRMEV) IV 1,000 mg  1,000 mg Intravenous Q8H PRN Lurline Del, MD       albuterol (PROVENTIL) (2.5 MG/3ML) 0.083% nebulizer solution 2.5 mg  2.5 mg Nebulization Q2H PRN Lurline Del, MD       amLODipine (NORVASC) tablet 5 mg  5 mg Oral QPM Skip Mayer A, MD   5 mg at 04/30/23 0053   bisacodyl (DULCOLAX) EC tablet 5 mg  5 mg Oral QHS Skip Mayer A, MD       cefTRIAXone (ROCEPHIN) 2 g in sodium chloride 0.9 % 100 mL IVPB  2 g Intravenous Q24H Skip Mayer A, MD       ketorolac (TORADOL) 30 MG/ML injection 30 mg  30 mg Intravenous Q6H PRN Lurline Del, MD       ondansetron [facility]) tablet 4 mg  4 mg Oral Q6H PRN Lurline Del, MD       Or   ondansetron [facility]) injection 4 mg  4 mg Intravenous Q6H PRN Lurline Del, MD       polyethylene glycol (MIRALAX / GLYCOLAX) packet 17 g  17 g Oral Daily Lurline Del, MD        Allergies as of 04/29/2023 - Review Complete 04/29/2023  Allergen Reaction Noted   Unasyn [ampicillin-sulbactam sodium] Hives, Itching, Swelling,  and Rash 01/05/2014    Social History   Socioeconomic History   Marital status: Married    Spouse name: Not on file   Number of children: Not on file   Years of education: Not on file   Highest education level: Not on file  Occupational History   Not on file  Tobacco Use   Smoking status: Former    Current packs/day: 0.00    Types: Cigarettes    Quit date: 04/09/1974    Years since quitting: 49.0   Smokeless tobacco: Never  Vaping Use   Vaping status: Never Used  Substance and Sexual Activity   Alcohol use: No   Drug use: No   Sexual activity: Not Currently  Other Topics Concern   Not on file  Social History Narrative   Not on file   Social Determinants of Health   Financial Resource Strain: Not on file  Food Insecurity: No Food Insecurity (04/29/2023)   Hunger Vital Sign    Worried About Running Out of Food in the Last Year: Never true    Ran Out of Food in the Last Year: Never true  Transportation Needs: No Transportation Needs (04/29/2023)   PRAPARE - Administrator, Civil Service (Medical): No    Lack of Transportation (Non-Medical): No  Physical Activity: Not on file  Stress: Not on file  Social Connections: Not on file  Intimate Partner Violence: Not At Risk (04/29/2023)   Humiliation, Afraid, Rape, and Kick questionnaire    Fear of Current or Ex-Partner: No    Emotionally Abused: No    Physically Abused: No    Sexually Abused: No     Code Status   Code Status: Full Code  Review of Systems: All systems reviewed and negative except where noted in HPI.  Physical Exam: Vital signs in last 24 hours: Temp:  [97.4 F (36.3 C)-98.4 F (36.9 C)] 98.1 F (36.7 C) (07/17 0949)  Pulse Rate:  [70-86] 76 (07/17 0949) Resp:  [14-19] 14 (07/17 0711) BP: (106-169)/(68-101) 169/88 (07/17 0949) SpO2:  [94 %-100 %] 97 % (07/17 0949) Weight:  [163.3 kg-166 kg] 166 kg (07/16 2339) Last BM Date : 04/29/23  General:  Pleasant male in NAD Psych:   Cooperative. Normal mood and affect Eyes: Pupils equal Ears:  Normal auditory acuity Nose: No deformity, discharge or lesions Neck:  Supple, no masses felt Lungs:  Clear to auscultation.  Heart:  Regular rate, regular rhythm.  Abdomen:  Soft, massively distended, nontender, "metallic"  bowel sounds.  Rectal :  Deferred Msk: Symmetrical without gross deformities.  Neurologic:  Alert, oriented, grossly normal neurologically Extremities : No edema Skin:  Intact without significant lesions.    Intake/Output from previous day: 07/16 0701 - 07/17 0700 In: 1210.8 [P.O.:30; I.V.:593.8; IV Piggyback:587] Out: -      Willette Cluster, NP-C @  04/30/2023, 9:51 AM

## 2023-04-30 NOTE — Progress Notes (Signed)
Progress Note    [name] [name]   HYQ:[id number]  DOB: 1956-05-05  DOA: 04/29/2023     1 PCP: Renford Dills, MD  Initial CC: abdominal distension  Hospital Course: [name] [name] is a 67 yo male with PMH neurogenic bladder with chronic indwelling Foley, recurrent UTI, hydronephrosis, lumbar spinal stenosis with myelopathy, CKD, OSA, morbid obesity. He presented to the ER with worsening abdominal distention/bloating.  He denied any significant nausea or vomiting.  No overt abdominal pain.  He did endorse having small bowel movements prior to hospitalization but could not elaborate on frequency or amount.  He is bedbound at baseline and has caretakers at home. CT abdomen/pelvis was obtained on evaluation and showed significant colonic distention measuring 16 mm.  Case was discussed with general surgery with no recommendations for invasive intervention and recommended GI evaluation. Imaging was also notable for right ureteral calculus with associated moderate right hydronephrosis.  Foley catheter was exchanged in the ER on 04/29/2023.  Urology was also consulted on admission.  Interval History:  Patient notes that his abdomen feels mildly softer this morning but remains significantly distended.  Denies any abdominal pain nor any nausea/vomiting.  Foley exchanged yesterday and draining yellow urine noted in bag this morning.  Assessment and Plan:  Ileus /atonic colon -possible related to electrolyte abnormality / medications (not on chronic opiates aside from occasional tramadol)  vs neurogenic bowel-sequela of spinal stenosis  - will replete electrolytes as needed - still no abd pain; if occurs will repeat CT A/P - no intervention per surgery after discussion on admission - GI consulted and following, appreciate assistance - Continue serial abdominal exams and imaging - MRI L spine shows foraminal narrowing from L1-S1   Neurogenic bladder Chronic indwelling foley - suspect UA is colonized; no  overt UTI symptoms and does not appear toxic/septic (afebrile and no leukocytosis) - will d/c abx at this time - foley exchanged on 7/16 - appreciate urology evaluation as well  Right hydronephrosis -CT shows right ureteral calculus measuring 7 x 9 x 15 mm with moderate right hydronephrosis -Evaluated by urology and tentative plan for outpatient ureteroscopy unless required to be done inpatient.  Patient currently asymptomatic with stone   AKI -baseline cr 0.85; creat up to 1.48 on admission -avoid nephrotoxic medication  - continue IVF    Hypertension - continue amlodipine    OSA - noncompliant with cpap -monitor pulse ox , nocturnal O2   Spinal stenosis lumbar region  -with associated myelopathy and chronic debility  -possible cause of atonic colon   Morbid Obesity  - Complicates overall prognosis and care - Body mass index is 52.52 kg/m.   Old records reviewed in assessment of this patient  Antimicrobials: Rocephin 7/16 >> 7/17  DVT prophylaxis:  SCDs Start: 04/30/23 0013   Code Status:   Code Status: Full Code  Mobility Assessment (Last 72 Hours)     Mobility Assessment     Row Name 04/29/23 2339           Does patient have an order for bedrest or is patient medically unstable Yes- Bedfast (Level 1) - Complete                Barriers to discharge: none Disposition Plan:  Home Status is: Inpt  Objective: Blood pressure (!) 144/81, pulse 82, temperature 97.7 F (36.5 C), temperature source Oral, resp. rate 19, height 5\' 10"  (1.778 m), weight (!) 166 kg, SpO2 97%.  Examination:  Physical Exam Constitutional:  General: He is not in acute distress.    Appearance: Normal appearance. He is obese. He is not ill-appearing.  HENT:     Head: Normocephalic and atraumatic.     Mouth/Throat:     Mouth: Mucous membranes are moist.  Eyes:     Extraocular Movements: Extraocular movements intact.  Cardiovascular:     Rate and Rhythm: Normal rate and  regular rhythm.  Pulmonary:     Effort: Pulmonary effort is normal. No respiratory distress.     Breath sounds: Normal breath sounds. No wheezing.  Abdominal:     General: Abdomen is protuberant. Bowel sounds are decreased. There is distension.     Palpations: Abdomen is soft.     Tenderness: There is no abdominal tenderness.  Musculoskeletal:        General: Normal range of motion.     Cervical back: Normal range of motion and neck supple.  Skin:    General: Skin is warm and dry.  Neurological:     General: No focal deficit present.     Mental Status: He is alert.  Psychiatric:        Mood and Affect: Mood normal.        Behavior: Behavior normal.      Consultants:  GI Urology   Procedures:    Data Reviewed: Results for orders placed or performed during the hospital encounter of 04/29/23 (from the past 24 hour(s))  Brain natriuretic peptide     Status: Abnormal   Collection Time: 04/29/23  5:01 PM  Result Value Ref Range   B Natriuretic Peptide 123.5 (H) 0.0 - 100.0 pg/mL  CBC     Status: Abnormal   Collection Time: 04/29/23  5:47 PM  Result Value Ref Range   WBC 6.1 4.0 - 10.5 K/uL   RBC 4.51 4.22 - 5.81 MIL/uL   Hemoglobin 11.0 (L) 13.0 - 17.0 g/dL   HCT 54.0 (L) 98.1 - 19.1 %   MCV 78.9 (L) 80.0 - 100.0 fL   MCH 24.4 (L) 26.0 - 34.0 pg   MCHC 30.9 30.0 - 36.0 g/dL   RDW 47.8 (H) 29.5 - 62.1 %   Platelets 411 (H) 150 - 400 K/uL   nRBC 0.0 0.0 - 0.2 %  Comprehensive metabolic panel     Status: Abnormal   Collection Time: 04/29/23  5:47 PM  Result Value Ref Range   Sodium 138 135 - 145 mmol/L   Potassium 2.9 (L) 3.5 - 5.1 mmol/L   Chloride 106 98 - 111 mmol/L   CO2 23 22 - 32 mmol/L   Glucose, Bld 99 70 - 99 mg/dL   BUN 27 (H) 8 - 23 mg/dL   Creatinine, Ser 3.08 (H) 0.61 - 1.24 mg/dL   Calcium 8.4 (L) 8.9 - 10.3 mg/dL   Total Protein 8.3 (H) 6.5 - 8.1 g/dL   Albumin 3.1 (L) 3.5 - 5.0 g/dL   AST 19 15 - 41 U/L   ALT 11 0 - 44 U/L   Alkaline Phosphatase  85 38 - 126 U/L   Total Bilirubin 0.7 0.3 - 1.2 mg/dL   GFR, Estimated 52 (L) >60 mL/min   Anion gap 9 5 - 15  Lipase, blood     Status: None   Collection Time: 04/29/23  5:47 PM  Result Value Ref Range   Lipase 32 11 - 51 U/L  Urinalysis, Routine w reflex microscopic -Urine, Catheterized; Indwelling urinary catheter     Status: Abnormal   Collection  Time: 04/29/23  9:45 PM  Result Value Ref Range   Color, Urine YELLOW YELLOW   APPearance TURBID (A) CLEAR   Specific Gravity, Urine 1.040 (H) 1.005 - 1.030   pH 6.0 5.0 - 8.0   Glucose, UA NEGATIVE NEGATIVE mg/dL   Hgb urine dipstick MODERATE (A) NEGATIVE   Bilirubin Urine NEGATIVE NEGATIVE   Ketones, ur NEGATIVE NEGATIVE mg/dL   Protein, ur 540 (A) NEGATIVE mg/dL   Nitrite NEGATIVE NEGATIVE   Leukocytes,Ua MODERATE (A) NEGATIVE   RBC / HPF 21-50 0 - 5 RBC/hpf   WBC, UA >50 0 - 5 WBC/hpf   Bacteria, UA MANY (A) NONE SEEN   Squamous Epithelial / HPF 0-5 0 - 5 /HPF   WBC Clumps PRESENT   HIV Antibody (routine testing w rflx)     Status: None   Collection Time: 04/30/23 12:48 AM  Result Value Ref Range   HIV Screen 4th Generation wRfx Non Reactive Non Reactive  Lactic acid, plasma     Status: None   Collection Time: 04/30/23 12:48 AM  Result Value Ref Range   Lactic Acid, Venous 0.7 0.5 - 1.9 mmol/L  Magnesium     Status: None   Collection Time: 04/30/23 12:48 AM  Result Value Ref Range   Magnesium 2.4 1.7 - 2.4 mg/dL  Comprehensive metabolic panel     Status: Abnormal   Collection Time: 04/30/23 12:48 AM  Result Value Ref Range   Sodium 137 135 - 145 mmol/L   Potassium 2.9 (L) 3.5 - 5.1 mmol/L   Chloride 107 98 - 111 mmol/L   CO2 22 22 - 32 mmol/L   Glucose, Bld 95 70 - 99 mg/dL   BUN 26 (H) 8 - 23 mg/dL   Creatinine, Ser 9.81 (H) 0.61 - 1.24 mg/dL   Calcium 8.3 (L) 8.9 - 10.3 mg/dL   Total Protein 8.2 (H) 6.5 - 8.1 g/dL   Albumin 3.1 (L) 3.5 - 5.0 g/dL   AST 19 15 - 41 U/L   ALT 11 0 - 44 U/L   Alkaline Phosphatase  82 38 - 126 U/L   Total Bilirubin 0.6 0.3 - 1.2 mg/dL   GFR, Estimated 54 (L) >60 mL/min   Anion gap 8 5 - 15  CBC     Status: Abnormal   Collection Time: 04/30/23 12:48 AM  Result Value Ref Range   WBC 5.8 4.0 - 10.5 K/uL   RBC 4.42 4.22 - 5.81 MIL/uL   Hemoglobin 10.7 (L) 13.0 - 17.0 g/dL   HCT 19.1 (L) 47.8 - 29.5 %   MCV 79.4 (L) 80.0 - 100.0 fL   MCH 24.2 (L) 26.0 - 34.0 pg   MCHC 30.5 30.0 - 36.0 g/dL   RDW 62.1 (H) 30.8 - 65.7 %   Platelets 403 (H) 150 - 400 K/uL   nRBC 0.0 0.0 - 0.2 %  Culture, blood (Routine X 2) w Reflex to ID Panel     Status: None (Preliminary result)   Collection Time: 04/30/23  1:31 AM   Specimen: BLOOD  Result Value Ref Range   Specimen Description      BLOOD SITE NOT SPECIFIED Performed at [facility], [address] [place], [place] [address]    Special Requests      BOTTLES DRAWN AEROBIC AND ANAEROBIC Blood Culture results may not be optimal due to an inadequate volume of blood received in culture bottles Performed at [facility], [address] [place],  [place] [address]    Culture      NO GROWTH < 12 HOURS Performed at [facility], [address], [place], [place] [address]    Report Status PENDING   Sedimentation rate     Status: Abnormal   Collection Time: 04/30/23  1:31 AM  Result Value Ref Range   Sed Rate 50 (H) 0 - 16 mm/hr  C-reactive protein     Status: Abnormal   Collection Time: 04/30/23  1:31 AM  Result Value Ref Range   CRP 5.6 (H) <1.0 mg/dL  Culture, blood (Routine X 2) w Reflex to ID Panel     Status: None (Preliminary result)   Collection Time: 04/30/23  1:33 AM   Specimen: BLOOD LEFT ARM  Result Value Ref Range   Specimen Description      BLOOD LEFT ARM Performed at [facility], [address], [place], [place] [address]    Special Requests      BOTTLES DRAWN AEROBIC AND ANAEROBIC Blood Culture results may not be optimal due to an inadequate volume of blood  received in culture bottles Performed at [facility], [address] [place], [place] [address]    Culture      NO GROWTH < 12 HOURS Performed at [facility], [address] [place], [place] [address]    Report Status PENDING   Lactic acid, plasma     Status: None   Collection Time: 04/30/23  4:17 AM  Result Value Ref Range   Lactic Acid, Venous 0.8 0.5 - 1.9 mmol/L  C Difficile Quick Screen w PCR reflex     Status: None   Collection Time: 04/30/23  6:35 AM   Specimen: STOOL  Result Value Ref Range   C Diff antigen NEGATIVE NEGATIVE   C Diff toxin NEGATIVE NEGATIVE   C Diff interpretation No C. difficile detected.   Ferritin     Status: None   Collection Time: 04/30/23 12:22 PM  Result Value Ref Range   Ferritin 92 24 - 336 ng/mL  Iron and TIBC     Status: Abnormal   Collection Time: 04/30/23 12:22 PM  Result Value Ref Range   Iron 24 (L) 45 - 182 ug/dL   TIBC 756 (L) 433 - 295 ug/dL   Saturation Ratios 10 (L) 17.9 - 39.5 %   UIBC 211 ug/dL  Basic metabolic panel     Status: Abnormal   Collection Time: 04/30/23 12:22 PM  Result Value Ref Range   Sodium 139 135 - 145 mmol/L   Potassium 3.5 3.5 - 5.1 mmol/L   Chloride 110 98 - 111 mmol/L   CO2 21 (L) 22 - 32 mmol/L   Glucose, Bld 76 70 - 99 mg/dL   BUN 22 8 - 23 mg/dL   Creatinine, Ser 1.88 (H) 0.61 - 1.24 mg/dL   Calcium 8.1 (L) 8.9 - 10.3 mg/dL   GFR, Estimated >41 >66 mL/min   Anion gap 8 5 - 15    I have reviewed pertinent nursing notes, vitals, labs, and images as necessary. I have ordered labwork to follow up on as indicated.  I have reviewed the last notes from staff over past 24 hours. I have discussed patient's care plan and test results with nursing staff, CM/SW, and other staff as appropriate.  Time spent: Greater than 50% of the 55 minute visit was spent in counseling/coordination of care for the patient as laid out in the A&P.  LOS: 1 day   Lewie Chamber, MD [facility] 04/30/2023, 4:43 PM

## 2023-05-01 ENCOUNTER — Encounter (HOSPITAL_COMMUNITY)
Admission: EM | Disposition: A | Discharge status: Home / Self Care | Payer: Self-pay | Source: Home / Self Care | Attending: Internal Medicine

## 2023-05-01 ENCOUNTER — Encounter (HOSPITAL_COMMUNITY): Payer: Self-pay | Admitting: Internal Medicine

## 2023-05-01 ENCOUNTER — Inpatient Hospital Stay (HOSPITAL_COMMUNITY): Payer: Medicare Other

## 2023-05-01 DIAGNOSIS — N179 Acute kidney failure, unspecified: Secondary | ICD-10-CM | POA: Diagnosis not present

## 2023-05-01 DIAGNOSIS — K5939 Other megacolon: Secondary | ICD-10-CM

## 2023-05-01 DIAGNOSIS — D509 Iron deficiency anemia, unspecified: Secondary | ICD-10-CM | POA: Diagnosis not present

## 2023-05-01 DIAGNOSIS — K592 Neurogenic bowel, not elsewhere classified: Secondary | ICD-10-CM | POA: Diagnosis not present

## 2023-05-01 DIAGNOSIS — R140 Abdominal distension (gaseous): Secondary | ICD-10-CM | POA: Diagnosis not present

## 2023-05-01 DIAGNOSIS — D638 Anemia in other chronic diseases classified elsewhere: Secondary | ICD-10-CM

## 2023-05-01 DIAGNOSIS — Z978 Presence of other specified devices: Secondary | ICD-10-CM | POA: Diagnosis not present

## 2023-05-01 DIAGNOSIS — K567 Ileus, unspecified: Secondary | ICD-10-CM | POA: Diagnosis not present

## 2023-05-01 HISTORY — PX: FLEXIBLE SIGMOIDOSCOPY: SHX5431

## 2023-05-01 LAB — GASTROINTESTINAL PANEL BY PCR, STOOL (REPLACES STOOL CULTURE)

## 2023-05-01 LAB — BASIC METABOLIC PANEL
Anion gap: 6 (ref 5–15)
BUN: 16 mg/dL (ref 8–23)
CO2: 24 mmol/L (ref 22–32)
Calcium: 8 mg/dL — ABNORMAL LOW (ref 8.9–10.3)
Chloride: 110 mmol/L (ref 98–111)
Creatinine, Ser: 1.05 mg/dL (ref 0.61–1.24)
GFR, Estimated: >60 mL/min (ref >60)
Glucose, Bld: 78 mg/dL (ref 70–99)
Potassium: 3.5 mmol/L (ref 3.5–5.1)
Sodium: 140 mmol/L (ref 135–145)

## 2023-05-01 LAB — CBC WITH DIFFERENTIAL/PLATELET
Abs Immature Granulocytes: 0.01 10*3/uL (ref 0.00–0.07)
Basophils Absolute: 0 10*3/uL (ref 0.0–0.1)
Basophils Relative: 0 %
Eosinophils Absolute: 0.2 10*3/uL (ref 0.0–0.5)
Eosinophils Relative: 2 %
HCT: 34.7 % — ABNORMAL LOW (ref 39.0–52.0)
Hemoglobin: 10.5 g/dL — ABNORMAL LOW (ref 13.0–17.0)
Immature Granulocytes: 0 %
Lymphocytes Relative: 14 %
Lymphs Abs: 0.9 10*3/uL (ref 0.7–4.0)
MCH: 24.1 pg — ABNORMAL LOW (ref 26.0–34.0)
MCHC: 30.3 g/dL (ref 30.0–36.0)
MCV: 79.6 fL — ABNORMAL LOW (ref 80.0–100.0)
Monocytes Absolute: 0.5 10*3/uL (ref 0.1–1.0)
Monocytes Relative: 7 %
Neutro Abs: 5.1 10*3/uL (ref 1.7–7.7)
Neutrophils Relative %: 77 %
Platelets: 388 10*3/uL (ref 150–400)
RBC: 4.36 MIL/uL (ref 4.22–5.81)
RDW: 16.9 % — ABNORMAL HIGH (ref 11.5–15.5)
WBC: 6.8 10*3/uL (ref 4.0–10.5)
nRBC: 0 % (ref 0.0–0.2)

## 2023-05-01 LAB — MAGNESIUM: Magnesium: 2.1 mg/dL (ref 1.7–2.4)

## 2023-05-01 SURGERY — SIGMOIDOSCOPY, FLEXIBLE
Anesthesia: Moderate Sedation

## 2023-05-01 MED ORDER — POTASSIUM CHLORIDE CRYS ER 20 MEQ PO TBCR
20.0000 meq | EXTENDED_RELEASE_TABLET | Freq: Once | ORAL | Status: DC
  Filled 2023-05-01: qty 1

## 2023-05-01 MED ORDER — CHLORHEXIDINE GLUCONATE CLOTH 2 % EX PADS
6.0000 | MEDICATED_PAD | Freq: Every day | CUTANEOUS | Status: DC
  Administered 2023-05-01 – 2023-05-03 (×3): 6 via TOPICAL

## 2023-05-01 MED ORDER — FENTANYL CITRATE (PF) 100 MCG/2ML IJ SOLN
INTRAMUSCULAR | Status: AC
  Filled 2023-05-01: qty 4

## 2023-05-01 MED ORDER — POTASSIUM CHLORIDE 10 MEQ/100ML IV SOLN
10.0000 meq | INTRAVENOUS | Status: DC
  Administered 2023-05-01 (×2): 10 meq via INTRAVENOUS
  Filled 2023-05-01 (×3): qty 100

## 2023-05-01 MED ORDER — TAMSULOSIN HCL 0.4 MG PO CAPS
0.4000 mg | ORAL_CAPSULE | Freq: Every day | ORAL | Status: DC
  Administered 2023-05-01 – 2023-05-04 (×4): 0.4 mg via ORAL
  Filled 2023-05-01 (×4): qty 1

## 2023-05-01 MED ORDER — MIDAZOLAM HCL (PF) 5 MG/ML IJ SOLN
INTRAMUSCULAR | Status: AC
  Filled 2023-05-01: qty 2

## 2023-05-01 MED ORDER — PYRIDOSTIGMINE BROMIDE 60 MG PO TABS
60.0000 mg | ORAL_TABLET | Freq: Three times a day (TID) | ORAL | Status: DC
  Administered 2023-05-01 – 2023-05-03 (×6): 60 mg via ORAL
  Filled 2023-05-01 (×9): qty 1

## 2023-05-01 NOTE — Progress Notes (Signed)
Progress Note    [name] [name]   UJW:[id number]  DOB: 1956-09-30  DOA: 04/29/2023     2 PCP: Renford Dills, MD  Initial CC: abdominal distension  Hospital Course: [name] [name] is a 67 yo male with PMH neurogenic bladder with chronic indwelling Foley, recurrent UTI, hydronephrosis, lumbar spinal stenosis with myelopathy, CKD, OSA, morbid obesity. [name] [name] presented to the ER with worsening abdominal distention/bloating.  [name] [name] denied any significant nausea or vomiting.  No overt abdominal pain.  [name] [name] did endorse having small bowel movements prior to hospitalization but could not elaborate on frequency or amount.  [name] [name] is bedbound at baseline and has caretakers at home. CT abdomen/pelvis was obtained on evaluation and showed significant colonic distention measuring 16 mm.  Case was discussed with general surgery with no recommendations for invasive intervention and recommended GI evaluation. Imaging was also notable for right ureteral calculus with associated moderate right hydronephrosis.  Foley catheter was exchanged in the ER on 04/29/2023.  Urology was also consulted on admission.  Interval History:  Per staff and patient [name] [name] has continued to have BMs in the bed. Abdomen mildly less firm and less distended but xray notes possible worsening gaseous distension. No N/V.   Assessment and Plan:  Ileus /atonic colon -possible related to electrolyte abnormality / medications (not on chronic opiates aside from occasional tramadol)  vs neurogenic bowel-sequela of spinal stenosis  - will replete electrolytes as needed - still no abd pain; if occurs will repeat CT A/P - no intervention per surgery after discussion on admission - GI consulted and following, appreciate assistance - Continue serial abdominal exams and imaging - MRI L spine shows foraminal narrowing from L1-S1 - possible decompression planned today per GI - started on pyridostigmine on 7/18   Neurogenic bladder Chronic indwelling foley - suspect  UA is colonized; no overt UTI symptoms and does not appear toxic/septic (afebrile and no leukocytosis) - will d/c abx at this time - foley exchanged on 7/16 - appreciate urology evaluation as well  Right hydronephrosis -CT shows right ureteral calculus measuring 7 x 9 x 15 mm with moderate right hydronephrosis -Evaluated by urology and tentative plan for outpatient ureteroscopy unless required to be done inpatient.  Patient currently asymptomatic with stone - renal function also stable    AKI -baseline cr 0.85; creat up to 1.48 on admission -avoid nephrotoxic medication  - continue IVF until diet able to be advanced    Hypertension - continue amlodipine    OSA - noncompliant with cpap -monitor pulse ox , nocturnal O2   Spinal stenosis lumbar region  -with associated myelopathy and chronic debility  -possible cause of atonic colon   Morbid Obesity  - Complicates overall prognosis and care - Body mass index is 52.52 kg/m.   Old records reviewed in assessment of this patient  Antimicrobials: Rocephin 7/16 >> 7/17  DVT prophylaxis:  SCDs Start: 04/30/23 0013   Code Status:   Code Status: Full Code  Mobility Assessment (Last 72 Hours)     Mobility Assessment     Row Name 04/30/23 2130 04/29/23 2339         Does patient have an order for bedrest or is patient medically unstable Yes- Bedfast (Level 1) - Complete Yes- Bedfast (Level 1) - Complete      What is the highest level of mobility based on the progressive mobility assessment? Level 1 (Bedfast) - Unable to balance while sitting on edge of bed --      Is the  above level different from baseline mobility prior to current illness? Yes - Recommend PT order --              Mobility Assessment (Last 72 Hours)     Mobility Assessment     Row Name 04/30/23 2130 04/29/23 2339         Does patient have an order for bedrest or is patient medically unstable Yes- Bedfast (Level 1) - Complete Yes- Bedfast (Level 1)  - Complete      What is the highest level of mobility based on the progressive mobility assessment? Level 1 (Bedfast) - Unable to balance while sitting on edge of bed --      Is the above level different from baseline mobility prior to current illness? Yes - Recommend PT order --               Barriers to discharge: none Disposition Plan:  Home Status is: Inpt  Objective: Blood pressure (!) 159/82, pulse 68, temperature 98.3 F (36.8 C), temperature source Oral, resp. rate 19, height 5\' 10"  (1.778 m), weight (!) 166 kg, SpO2 96%.  Examination:  Physical Exam Constitutional:      General: [name] [name] is not in acute distress.    Appearance: Normal appearance. [name] [name] is obese. [name] [name] is not ill-appearing.  HENT:     Head: Normocephalic and atraumatic.     Mouth/Throat:     Mouth: Mucous membranes are moist.  Eyes:     Extraocular Movements: Extraocular movements intact.  Cardiovascular:     Rate and Rhythm: Normal rate and regular rhythm.  Pulmonary:     Effort: Pulmonary effort is normal. No respiratory distress.     Breath sounds: Normal breath sounds. No wheezing.  Abdominal:     General: Abdomen is protuberant. Bowel sounds are decreased. There is distension.     Palpations: Abdomen is soft.     Tenderness: There is no abdominal tenderness.  Musculoskeletal:        General: Normal range of motion.     Cervical back: Normal range of motion and neck supple.  Skin:    General: Skin is warm and dry.  Neurological:     General: No focal deficit present.     Mental Status: [name] [name] is alert.  Psychiatric:        Mood and Affect: Mood normal.        Behavior: Behavior normal.      Consultants:  GI Urology   Procedures:    Data Reviewed: Results for orders placed or performed during the hospital encounter of 04/29/23 (from the past 24 hour(s))  Basic metabolic panel     Status: Abnormal   Collection Time: 05/01/23  5:02 AM  Result Value Ref Range   Sodium 140 135 - 145 mmol/L    Potassium 3.5 3.5 - 5.1 mmol/L   Chloride 110 98 - 111 mmol/L   CO2 24 22 - 32 mmol/L   Glucose, Bld 78 70 - 99 mg/dL   BUN 16 8 - 23 mg/dL   Creatinine, Ser 4.40 0.61 - 1.24 mg/dL   Calcium 8.0 (L) 8.9 - 10.3 mg/dL   GFR, Estimated >10 >27 mL/min   Anion gap 6 5 - 15  CBC with Differential/Platelet     Status: Abnormal   Collection Time: 05/01/23  5:02 AM  Result Value Ref Range   WBC 6.8 4.0 - 10.5 K/uL   RBC 4.36 4.22 - 5.81 MIL/uL   Hemoglobin 10.5 (L) 13.0 -  17.0 g/dL   HCT 57.8 (L) 46.9 - 62.9 %   MCV 79.6 (L) 80.0 - 100.0 fL   MCH 24.1 (L) 26.0 - 34.0 pg   MCHC 30.3 30.0 - 36.0 g/dL   RDW 52.8 (H) 41.3 - 24.4 %   Platelets 388 150 - 400 K/uL   nRBC 0.0 0.0 - 0.2 %   Neutrophils Relative % 77 %   Neutro Abs 5.1 1.7 - 7.7 K/uL   Lymphocytes Relative 14 %   Lymphs Abs 0.9 0.7 - 4.0 K/uL   Monocytes Relative 7 %   Monocytes Absolute 0.5 0.1 - 1.0 K/uL   Eosinophils Relative 2 %   Eosinophils Absolute 0.2 0.0 - 0.5 K/uL   Basophils Relative 0 %   Basophils Absolute 0.0 0.0 - 0.1 K/uL   Immature Granulocytes 0 %   Abs Immature Granulocytes 0.01 0.00 - 0.07 K/uL  Magnesium     Status: None   Collection Time: 05/01/23  5:02 AM  Result Value Ref Range   Magnesium 2.1 1.7 - 2.4 mg/dL    I have reviewed pertinent nursing notes, vitals, labs, and images as necessary. I have ordered labwork to follow up on as indicated.  I have reviewed the last notes from staff over past 24 hours. I have discussed patient's care plan and test results with nursing staff, CM/SW, and other staff as appropriate.  Time spent: Greater than 50% of the 55 minute visit was spent in counseling/coordination of care for the patient as laid out in the A&P.   LOS: 2 days   Lewie Chamber, MD [facility] 05/01/2023, 1:47 PM

## 2023-05-01 NOTE — Op Note (Signed)
[facility] Patient Name: [name] [name] Procedure Date: 05/01/2023 MRN: [id number] Attending MD: Iva Boop , MD, [id number] Date of Birth: [birth year] CSN: [id number] Age: 67 Admit Type: Inpatient Procedure:                Flexible Sigmoidoscopy Indications:              Ileus Providers:                Iva Boop, MD, Lorenza Evangelist, RN, Martha Clan, RN, Harrington Challenger, Technician Referring MD:              Medicines:                None Complications:            No immediate complications. Estimated Blood Loss:     Estimated blood loss: none. Procedure:                Pre-Anesthesia Assessment:                           - Prior to the procedure, a History and Physical                            was performed, and patient medications and                            allergies were reviewed. The patient's tolerance of                            previous anesthesia was also reviewed. The risks                            and benefits of the procedure and the sedation                            options and risks were discussed with the patient.                            All questions were answered, and informed consent                            was obtained. Prior Anticoagulants: The patient has                            taken no anticoagulant or antiplatelet agents. ASA                            Grade Assessment: III - A patient with severe                            systemic disease. After reviewing the risks and  benefits, the patient was deemed in satisfactory                            condition to undergo the procedure.                           After obtaining informed consent, the scope was                            passed under direct vision. The CF-[id number] ([id number])                            Olympus colonoscope was introduced through the anus                            and advanced to the the  splenic flexure. The                            flexible sigmoidoscopy was performed with                            difficulty due to poor endoscopic visualization and                            a redundant colon. The patient tolerated the                            procedure well. No bowel preparation was given                            prior to the procedure. The quality of                            visualization was poor. Scope In: 4:22:16 PM Scope Out: 4:33:31 PM Total Procedure Duration: 0 hours 11 minutes 14 seconds  Findings:      The perianal and digital rectal examinations were normal.      The lumen of the rectum, sigmoid colon and descending colon was grossly       dilated. Suction - decompression. Scope kept reroflexing spontaneously       at area I think was splenic flexure. Impression:               - Dilated in the rectum, in the sigmoid colon and                            in the descending colon. Large amount of liquid                            stool also (was in bed also pre-procedure and was                            passing flatus)                           -  No specimens collected. Moderate Sedation:      none Recommendation:           - Return patient to hospital ward for ongoing care.                            Recheck KUB - willorder turns to left and right also Procedure Code(s):        --- Professional ---                           [phone], Sigmoidoscopy, flexible; diagnostic,                            including collection of specimen(s) by brushing or                            washing, when performed (separate procedure) Diagnosis Code(s):        --- Professional ---                           K59.39, Other meg[employer] CPT copyright 2022 [employer]. All rights reserved. The codes documented in this report are preliminary and upon coder review may  be revised to meet current compliance requirements. Iva Boop, MD 05/01/2023 4:46:45  PM This report has been signed electronically. Number of Addenda: 0

## 2023-05-01 NOTE — Progress Notes (Signed)
   Patient Name: [name] [name] Date of Encounter: 05/01/2023, 1:04 PM     Assessment and Plan  Colonic ileus - persitent and possibly worse  Decompressive sigmoidoscopy vs colonoscopy today   Subjective  Says he feels less distended, no pain + stool this AM   Objective  BP (!) 159/82 (BP Location: Right Arm)   Pulse 68   Temp 98.3 F (36.8 C) (Oral)   Resp 19   Ht 5\' 10"  (1.778 m)   Wt (!) 166 kg   SpO2 96%   BMI 52.52 kg/m  Lungs cta Cor NL Abd obese and very distended, hi pitch BS nontender  Recent Labs  Lab 04/29/23 1747 04/30/23 0048 04/30/23 1222 05/01/23 0502  NA 138 137 139 140  K 2.9* 2.9* 3.5 3.5  CL 106 107 110 110  CO2 23 22 21* 24  GLUCOSE 99 95 76 78  BUN 27* 26* 22 16  CREATININE 1.48* 1.43* 1.30* 1.05  CALCIUM 8.4* 8.3* 8.1* 8.0*  MG  --  2.4  --  2.1   Lab Results  Component Value Date   WBC 6.8 05/01/2023   HGB 10.5 (L) 05/01/2023   HCT 34.7 (L) 05/01/2023   MCV 79.6 (L) 05/01/2023   PLT 388 05/01/2023   Mg 2.1  DG Abd 1 View CLINICAL DATA:  Abdominal distention  EXAM: ABDOMEN - 1 VIEW  COMPARISON:  Previous studies including the examination of 04/30/2023  FINDINGS: There is marked gaseous distention of colon with interval worsening. There is no definite demonstrable small bowel dilation. Stomach is not distended.  IMPRESSION: There is marked gaseous distention of colon with interval worsening, possibly suggesting worsening ileus or distal colonic obstruction.  Electronically Signed   By: Ernie Avena M.D.   On: 05/01/2023 09:08     Iva Boop, MD, [facility] See Loretha Stapler on call - gastroenterology for best contact person 05/01/2023 1:04 PM

## 2023-05-01 NOTE — Consult Note (Signed)
Urology Consult Note   Requesting Attending Physician:  Lewie Chamber, MD Service Providing Consult: Urology  Consult Attending: Dr Alvester Morin  Reason for Consult:  Nephrolithiasis  HPI: [name] [name] is seen in consultation for reasons noted above at the request of Lewie Chamber, MD for evaluation of right obstructing ureteral stone.  Patient came into the ED because of lack of a bowel movement for a few weeks as well as nausea and vomiting.  He was found to have a transverse colon obstruction with significant dilation of the bowels on CT scan.  Incidentally a 1.5 cm right proximal obstructing ureteral stone was found.  The patient denies any significant right flank pain.  He has not had any changes in his urinary habits.  He is catheter dependent given his history of spinal stenosis causing neurogenic bladder.  Denies any fevers, chest pain or shortness of breath.   He has had kidney stones in the past and has had previous stone surgery.     Past Medical History: Past Medical History:  Diagnosis Date   Anemia    Arthritis    Chronic kidney disease    HX acute kidney failure / acute pyelonephritis / hydronephrosis / severe sepsis per discharge summary 12/27/13   GERD (gastroesophageal reflux disease)    History of kidney stones    Hypertension    Morbid obesity (HCC)    Obstructive sleep apnea    does not need c pap since 110 lb wt loss   Osteoporosis    Prurigo 2002   Scars    ON ARMS FROM CHEMICAL EXPLOSION 1999   Spinal stenosis     Past Surgical History:  Past Surgical History:  Procedure Laterality Date   ANTERIOR CERVICAL DECOMP/DISCECTOMY FUSION N/A 12/07/2018   Procedure: ANTERIOR CERVICAL DECOMPRESSION FUSION - CERVICAL THREE-CERVICAL FOUR, CERVICAL FOUR-CERVICAL FIVE, CERVICAL FIVE-CERVICAL SIX;  Surgeon: Julio Sicks, MD;  Location: [facility];  Service: Neurosurgery;  Laterality: N/A;   BACK SURGERY  01/23/2010   lumbar   BIOPSY  11/26/2022   Procedure: BIOPSY;  Surgeon:  Shellia Cleverly, DO;  Location: [facility];  Service: Gastroenterology;;   CHOLECYSTECTOMY N/A 03/24/2016   Procedure: LAPAROSCOPIC CHOLECYSTECTOMY WITH INTRAOPERATIVE CHOLANGIOGRAM;  Surgeon: Chevis Pretty III, MD;  Location: [facility];  Service: General;  Laterality: N/A;   CIRCUMCISION     CYSTOSCOPY WITH RETROGRADE PYELOGRAM, URETEROSCOPY AND STENT PLACEMENT Left 01/20/2014   Procedure: CYSTOSCOPY WITH RETROGRADE PYELOGRAM, URETEROSCOPY AND STENT EXCHANGE;  Surgeon: Milford Cage, MD;  Location: [facility];  Service: Urology;  Laterality: Left;  bugbee bladder fulguration   CYSTOSCOPY WITH STENT PLACEMENT Left 12/28/2013   Procedure: CYSTOSCOPY WITH STENT PLACEMENT left retrograde pyleogram;  Surgeon: Milford Cage, MD;  Location: [facility];  Service: Urology;  Laterality: Left;   CYSTOSCOPY/URETEROSCOPY/HOLMIUM LASER/STENT PLACEMENT Right 05/15/2021   Procedure: CYSTOSCOPY RIGHT RETROGRADE RIGHT URETEROSCOPY/HOLMIUM LASER/STENT PLACEMENT;  Surgeon: Bjorn Pippin, MD;  Location: [facility];  Service: Urology;  Laterality: Right;   ESOPHAGOGASTRODUODENOSCOPY N/A 12/07/2012   Procedure: ESOPHAGOGASTRODUODENOSCOPY (EGD);  Surgeon: Lodema Pilot, DO;  Location: [facility];  Service: General;  Laterality: N/A;   ESOPHAGOGASTRODUODENOSCOPY (EGD) WITH PROPOFOL N/A 11/26/2022   Procedure: ESOPHAGOGASTRODUODENOSCOPY (EGD) WITH PROPOFOL;  Surgeon: Shellia Cleverly, DO;  Location: [facility];  Service: Gastroenterology;  Laterality: N/A;   HOLMIUM LASER APPLICATION Left 01/20/2014   Procedure: HOLMIUM LASER APPLICATION;  Surgeon: Milford Cage, MD;  Location: [facility];  Service: Urology;  Laterality: Left;   KNEE ARTHROSCOPY Left 06/29/2018  Procedure: ARTHROSCOPY KNEE;  Surgeon: Kerrin Champagne, MD;  Location: [facility] OR;  Service: Orthopedics;  Laterality: Left;   LAPAROSCOPIC GASTRIC SLEEVE RESECTION N/A 12/07/2012   Procedure: LAPAROSCOPIC GASTRIC SLEEVE RESECTION;  Surgeon: Lodema Pilot, DO;  Location: [facility];   Service: General;  Laterality: N/A;  laparoscopic sleeve gastrectomy with EGD   LUMBAR LAMINECTOMY N/A 11/17/2017   Procedure: L2-3 LAMINECTOMY AND REDO LAMINECTOMIES  L3-4, L4-5 AND L5-S1;  Surgeon: Kerrin Champagne, MD;  Location: [facility];  Service: Orthopedics;  Laterality: N/A;   LUMBAR WOUND DEBRIDEMENT N/A 06/29/2018   Procedure: LUMBAR WOUND DEBRIDEMENT DRAINAGE AND IRRIGATION; AND ASPIRATION OF LEFT KNEE;  Surgeon: Kerrin Champagne, MD;  Location: [facility];  Service: Orthopedics;  Laterality: N/A;   SAVORY DILATION N/A 11/26/2022   Procedure: SAVORY DILATION;  Surgeon: Shellia Cleverly, DO;  Location: [facility];  Service: Gastroenterology;  Laterality: N/A;   TEE WITHOUT CARDIOVERSION N/A 07/01/2018   Procedure: TRANSESOPHAGEAL ECHOCARDIOGRAM (TEE);  Surgeon: Laurey Morale, MD;  Location: [facility] ENDOSCOPY;  Service: Cardiovascular;  Laterality: N/A;    Medication: Current Facility-Administered Medications  Medication Dose Route Frequency Provider Last Rate Last Admin   albuterol (PROVENTIL) (2.5 MG/3ML) 0.083% nebulizer solution 2.5 mg  2.5 mg Nebulization Q2H PRN Skip Mayer A, MD       amLODipine (NORVASC) tablet 5 mg  5 mg Oral QPM Skip Mayer A, MD   5 mg at 04/30/23 1728   bisacodyl (DULCOLAX) EC tablet 5 mg  5 mg Oral Daily Meredith Pel, NP   5 mg at 04/30/23 1728   bisacodyl (DULCOLAX) suppository 10 mg  10 mg Rectal BID Meredith Pel, NP   10 mg at 04/30/23 1728   ketorolac (TORADOL) 30 MG/ML injection 30 mg  30 mg Intravenous Q6H PRN Skip Mayer A, MD   30 mg at 05/01/23 0134   ondansetron (ZOFRAN) tablet 4 mg  4 mg Oral Q6H PRN Lurline Del, MD       Or   ondansetron [facility]) injection 4 mg  4 mg Intravenous Q6H PRN Lurline Del, MD       polyethylene glycol (MIRALAX / GLYCOLAX) packet 17 g  17 g Oral Daily Lurline Del, MD   17 g at 04/30/23 1728    Allergies: Allergies  Allergen Reactions   Unasyn [Ampicillin-Sulbactam Sodium]  Hives, Itching, Swelling and Rash    Social History: Social History   Tobacco Use   Smoking status: Former    Current packs/day: 0.00    Types: Cigarettes    Quit date: 04/09/1974    Years since quitting: 49.0   Smokeless tobacco: Never  Vaping Use   Vaping status: Never Used  Substance Use Topics   Alcohol use: No   Drug use: No    Family History Family History  Problem Relation Age of Onset   Diabetes Mother    Heart disease Father    Colon cancer Neg Hx    Colon polyps Neg Hx    Esophageal cancer Neg Hx    Rectal cancer Neg Hx    Stomach cancer Neg Hx     Review of Systems 10 systems were reviewed and are negative except as noted specifically in the HPI.  Objective   Vital signs in last 24 hours: BP (!) 168/110 (BP Location: Right Wrist)   Pulse 68   Temp 98.2 F (36.8 C) (Oral)   Resp 14   Ht 5\' 10"  (1.778 m)  Wt (!) 166 kg   SpO2 100%   BMI 52.52 kg/m   Physical Exam General: NAD, morbidly obese HEENT: /AT, EOMI, MMM Pulmonary: Normal work of breathing Cardiovascular: HDS, adequate peripheral perfusion Abdomen: Firm, distended and tympanic GU: Catheter in place Extremities: warm and well perfused Neuro: Alert  Most Recent Labs: Lab Results  Component Value Date   WBC 6.8 05/01/2023   HGB 10.5 (L) 05/01/2023   HCT 34.7 (L) 05/01/2023   PLT 388 05/01/2023    Lab Results  Component Value Date   NA 140 05/01/2023   K 3.5 05/01/2023   CL 110 05/01/2023   CO2 24 05/01/2023   BUN 16 05/01/2023   CREATININE 1.05 05/01/2023   CALCIUM 8.0 (L) 05/01/2023   MG 2.1 05/01/2023    Lab Results  Component Value Date   INR 1.05 06/28/2018   APTT 28 11/13/2017     Urine Culture: @[id number] (laburin,org,[id number])@   IMAGING: DG Abd 1 View  Result Date: 04/30/2023 CLINICAL DATA:  Abdominal pain EXAM: ABDOMEN - 1 VIEW COMPARISON:  CT done on 04/29/2023 FINDINGS: There is marked gaseous distention of colon. There is no significant small  bowel dilation. Stomach is not distended. Surgical clips are seen in right upper quadrant. In the previous CT, calculus was seen in the right ureter which could not be clearly identified in the radiographs. IMPRESSION: There is marked gaseous distention of colon suggesting possible ileus. Right ureteral calculus is not distinctly visualized and not evaluated. Electronically Signed   By: Ernie Avena M.D.   On: 04/30/2023 17:07   MR Lumbar Spine W Wo Contrast  Result Date: 04/30/2023 CLINICAL DATA:  Myelopathy, acute, lumbar spine. EXAM: MRI LUMBAR SPINE WITHOUT AND WITH CONTRAST TECHNIQUE: Multiplanar and multiecho pulse sequences of the lumbar spine were obtained without and with intravenous contrast. CONTRAST:  10mL GADAVIST GADOBUTROL 1 MMOL/ML IV SOLN COMPARISON:  MRI of the lumbar spine November 03, 2018. FINDINGS: Segmentation:  Standard. Alignment: Exaggerated lumbar lordosis with small anterolisthesis of L4 over L5 and L5 over S1. Vertebrae: No fracture, evidence of discitis, or bone lesion. Congenitally small spinal canal. L2-3, L3-4, L4-5 and L5-S1 laminectomies. Loss of disc height with partial endplate fusion at L4-5 and L5-S1. Prominent posterior element hypertrophy in the lower lumbar spine. Conus medullaris and cauda equina: Conus extends to the L2 level. Conus appear normal. Paraspinal and other soft tissues: Diffuse fatty atrophy of the paraspinal muscles. Disc levels: T12-L1: Shallow disc bulge and moderate hypertrophic facet degenerative changes resulting in mild spinal canal stenosis and mild bilateral neural foraminal narrowing. L1-2: Shallow disc bulge and moderate hypertrophic facet degenerative changes resulting in mild spinal canal stenosis and severe bilateral neural foraminal narrowing. L2-3: Disc bulge and prominent posterior element hypertrophy resulting in severe spinal canal stenosis and severe bilateral neural foraminal narrowing. L3-4: Disc bulge and prominent posterior  elements hypertrophy resulting in severe spinal canal stenosis, severe right and moderate left neural foraminal narrowing. L4-5: Anterolisthesis, endplate ridging and posterior elements hypertrophy and epidural lipomatosis. Findings result in moderate to severe narrowing of the thecal sac, prominent narrowing of the bilateral subarticular zones, severe right and moderate left neural foraminal narrowing. L5-S1: Small anterolisthesis, endplate ridging and posterior element hypertrophy resulting in moderate bilateral neural foraminal narrowing. Mild spinal canal stenosis. IMPRESSION: 1. Degenerative changes of the lumbar spine superimposed on a congenitally small spinal canal resulting in severe spinal canal stenosis at L2-3 and L3-4 and moderate to severe narrowing of the thecal sac at L4-5. 2.  Severe bilateral neural foraminal narrowing at L1-2 and L2-3. 3. Severe right and moderate left neural foraminal narrowing at L3-4 and L4-5. 4. Moderate bilateral neural foraminal narrowing at L5-S1. Electronically Signed   By: Baldemar Lenis M.D.   On: 04/30/2023 15:08   CT ABDOMEN PELVIS W CONTRAST  Result Date: 04/29/2023 CLINICAL DATA:  Ascites, abdominal distension, dyspnea EXAM: CT ABDOMEN AND PELVIS WITH CONTRAST TECHNIQUE: Multidetector CT imaging of the abdomen and pelvis was performed using the standard protocol following bolus administration of intravenous contrast. RADIATION DOSE REDUCTION: This exam was performed according to the departmental dose-optimization program which includes automated exposure control, adjustment of the mA and/or kV according to patient size and/or use of iterative reconstruction technique. CONTRAST:  OMNIPAQUE IOHEXOL 300 MG/ML  SOLN COMPARISON:  03/27/2021 FINDINGS: Lower chest: No acute abnormality. Mild cardiomegaly. Small hiatal hernia. Hepatobiliary: No focal liver abnormality is seen. Status post cholecystectomy. No biliary dilatation. Pancreas: Unremarkable  Spleen: Unremarkable Adrenals/Urinary Tract: The adrenal glands are unremarkable. Kidneys are normal in position. Mild bilateral cortical atrophy. Previously noted elongate right ureteral calculus has migrated slightly distally now at the level of the right transverse process of L5, measuring 7 x 9 x 15 mm. There has developed moderate right hydronephrosis. Additionally, there is marked proximal urothelial thickening and mild peripelvic and periureteric inflammatory stranding in keeping with urothelial inflammation, as can be seen with superimposed infection. Additional bilateral nonobstructing renal calculi are identified measuring up to 14 mm bilaterally. No hydronephrosis on the left. No ureteral calculi on the left. Mild bilateral nonspecific perinephric stranding again noted with no loculated perinephric fluid collection identified. Foley catheter balloon is seen within a decompressed bladder lumen. 7 mm bladder calculus now identified within the decompressed bladder lumen. Stomach/Bowel: There is marked gaseous distension of the colon without evidence of obstruction most suggestive of a colonic ileus. This appears progressive since prior examination and the transverse colon measures up to 16 mm in diameter. Surgical changes of gastric sleeve resection are identified. The small bowel is unremarkable. Appendix is unremarkable. No free intraperitoneal gas or fluid. Vascular/Lymphatic: Aortic atherosclerosis. No enlarged abdominal or pelvic lymph nodes. Reproductive: Prostate is unremarkable. Other: No abdominal wall hernia. Mild nonspecific subcutaneous edema within the pannus. Musculoskeletal: Degenerative and postsurgical changes are noted within the lumbar spine with ankylosis of the vertebral bodies and posterior elements of L4-S1. Bridging osteophytes are noted within the visualized thoracolumbar spine from a T7 through L1. No acute bone abnormality. No lytic or blastic bone lesion. IMPRESSION: 1. Interval  distal migration of the previously noted elongate right ureteral calculus now at the level of the right transverse process of L5, measuring 7 x 9 x 15 mm. Interval development of moderate right hydronephrosis. Additionally, there is marked proximal urothelial thickening and mild peripelvic and periureteric inflammatory stranding in keeping with urothelial inflammation, as can be seen with superimposed infection. Correlation with urinalysis and urine culture may be helpful for further management. 2. Superimposed moderate bilateral nonobstructing nephrolithiasis. 3. 7 mm bladder calculus now identified within the decompressed bladder lumen in keeping with a recently passed calculus. 4. Progressive gaseous distension of the colon without evidence of obstruction most suggestive of a colonic ileus. Transverse colon measures up to 16 cm in diameter. 5. Mild cardiomegaly. Small hiatal hernia. 6. Aortic atherosclerosis. Aortic Atherosclerosis (ICD10-I70.0). Electronically Signed   By: Helyn Numbers M.D.   On: 04/29/2023 21:02   DG Chest Port 1 View  Result Date: 04/29/2023 CLINICAL DATA:  Shortness  of breath over the last several weeks EXAM: PORTABLE CHEST 1 VIEW COMPARISON:  12/25/2018 FINDINGS: Cardiomegaly with left ventricular prominence. The patient has taken a poor inspiration. The upper lungs are clear. Possible mild atelectasis or infiltrate at the lung bases. No evidence of heart failure or effusion. Consider two-view chest radiography if concern persists. IMPRESSION: Cardiomegaly with left ventricular prominence. Poor inspiration. Possible mild atelectasis or infiltrate at the lung bases. Electronically Signed   By: Paulina Fusi M.D.   On: 04/29/2023 17:10    ------  Assessment:  67 y.o. male with neurogenic bladder secondary to spinal stenosis who is catheter dependent who presented with concern for transverse colon obstruction found to incidentally have a 1.5 cm obstructing right proximal ureteral  stone with mild hydronephrosis.   Patient currently AHDS today. He did report some increased back pain throughout his back yesterday but he is otherwise stable.   Cr downtrend to 1.05 from 1.3.    Recommendations: Trend creatinine Please have patient schedule follow-up appointment with urology for definitive stone treatment. If Patient fevers or becomes unstable, would consider ureteral stent. Continue flomax     Thank you for this consult. Please contact the urology consult pager with any further questions/concerns.

## 2023-05-01 NOTE — Progress Notes (Signed)
Transition of Care [facility]) - Inpatient Brief Assessment   Patient Details  Name: Arno Cullers MRN: [id number] Date of Birth: 04/16/1956  [facility] of [facility]) CM/SW Contact:    Larrie Kass, LCSW Phone Number: 05/01/2023, 2:41 PM   Clinical Narrative: Transition of Care Department [facility]) has reviewed patient and no TOC needs have been identified at this time. We will continue to monitor patient advancement through interdisciplinary progression rounds. If new patient transition needs arise, please place a TOC consult.   Transition of Care Asessment: Insurance and Status: Insurance coverage has been reviewed Patient has primary care physician: Yes Home environment has been reviewed: yes   Prior/Current Home Services: No current home services Social Determinants of Health Reivew: SDOH reviewed no interventions necessary Readmission risk has been reviewed: Yes Transition of care needs: no transition of care needs at this time

## 2023-05-02 ENCOUNTER — Encounter (HOSPITAL_COMMUNITY): Payer: Self-pay | Admitting: Internal Medicine

## 2023-05-02 ENCOUNTER — Inpatient Hospital Stay (HOSPITAL_COMMUNITY): Payer: Medicare Other

## 2023-05-02 DIAGNOSIS — Z978 Presence of other specified devices: Secondary | ICD-10-CM | POA: Diagnosis not present

## 2023-05-02 DIAGNOSIS — N179 Acute kidney failure, unspecified: Secondary | ICD-10-CM | POA: Diagnosis not present

## 2023-05-02 DIAGNOSIS — K592 Neurogenic bowel, not elsewhere classified: Secondary | ICD-10-CM | POA: Diagnosis not present

## 2023-05-02 DIAGNOSIS — D509 Iron deficiency anemia, unspecified: Secondary | ICD-10-CM | POA: Diagnosis not present

## 2023-05-02 DIAGNOSIS — K567 Ileus, unspecified: Secondary | ICD-10-CM | POA: Diagnosis not present

## 2023-05-02 DIAGNOSIS — R140 Abdominal distension (gaseous): Secondary | ICD-10-CM | POA: Diagnosis not present

## 2023-05-02 LAB — CBC WITH DIFFERENTIAL/PLATELET
Abs Immature Granulocytes: 0.07 10*3/uL (ref 0.00–0.07)
Basophils Absolute: 0 10*3/uL (ref 0.0–0.1)
Basophils Relative: 0 %
Eosinophils Absolute: 0.2 10*3/uL (ref 0.0–0.5)
Eosinophils Relative: 3 %
HCT: 35.2 % — ABNORMAL LOW (ref 39.0–52.0)
Hemoglobin: 10.6 g/dL — ABNORMAL LOW (ref 13.0–17.0)
Immature Granulocytes: 1 %
Lymphocytes Relative: 20 %
Lymphs Abs: 1.1 10*3/uL (ref 0.7–4.0)
MCH: 23.8 pg — ABNORMAL LOW (ref 26.0–34.0)
MCHC: 30.1 g/dL (ref 30.0–36.0)
MCV: 79.1 fL — ABNORMAL LOW (ref 80.0–100.0)
Monocytes Absolute: 0.5 10*3/uL (ref 0.1–1.0)
Monocytes Relative: 9 %
Neutro Abs: 3.7 10*3/uL (ref 1.7–7.7)
Neutrophils Relative %: 67 %
Platelets: 367 10*3/uL (ref 150–400)
RBC: 4.45 MIL/uL (ref 4.22–5.81)
RDW: 16.9 % — ABNORMAL HIGH (ref 11.5–15.5)
WBC: 5.5 10*3/uL (ref 4.0–10.5)
nRBC: 0 % (ref 0.0–0.2)

## 2023-05-02 LAB — BASIC METABOLIC PANEL
Anion gap: 8 (ref 5–15)
BUN: 12 mg/dL (ref 8–23)
CO2: 25 mmol/L (ref 22–32)
Calcium: 8.2 mg/dL — ABNORMAL LOW (ref 8.9–10.3)
Chloride: 106 mmol/L (ref 98–111)
Creatinine, Ser: 0.89 mg/dL (ref 0.61–1.24)
GFR, Estimated: >60 mL/min (ref >60)
Glucose, Bld: 81 mg/dL (ref 70–99)
Potassium: 3 mmol/L — ABNORMAL LOW (ref 3.5–5.1)
Sodium: 139 mmol/L (ref 135–145)

## 2023-05-02 LAB — URINE CULTURE: Culture: >=100000 — AB

## 2023-05-02 LAB — MAGNESIUM: Magnesium: 2 mg/dL (ref 1.7–2.4)

## 2023-05-02 LAB — CULTURE, BLOOD (ROUTINE X 2): Culture: NO GROWTH

## 2023-05-02 MED ORDER — POTASSIUM CHLORIDE CRYS ER 20 MEQ PO TBCR
40.0000 meq | EXTENDED_RELEASE_TABLET | ORAL | Status: AC
  Administered 2023-05-02 (×2): 40 meq via ORAL

## 2023-05-02 NOTE — Progress Notes (Signed)
Progress Note    [name]   ONG:[id number]  DOB: [birth year]  DOA: 04/29/2023     3 PCP: Renford Dills, MD  Initial CC: abdominal distension  Hospital Course: [name] is a 67 yo male with PMH neurogenic bladder with chronic indwelling Foley, recurrent UTI, hydronephrosis, lumbar spinal stenosis with myelopathy, CKD, OSA, morbid obesity. He presented to the ER with worsening abdominal distention/bloating.  He denied any significant nausea or vomiting.  No overt abdominal pain.  He did endorse having small bowel movements prior to hospitalization but could not elaborate on frequency or amount.  He is bedbound at baseline and has caretakers at home. CT abdomen/pelvis was obtained on evaluation and showed significant colonic distention measuring 16 mm.  Case was discussed with general surgery with no recommendations for invasive intervention and recommended GI evaluation. Imaging was also notable for right ureteral calculus with associated moderate right hydronephrosis.  Foley catheter was exchanged in the ER on 04/29/2023.  Urology was also consulted on admission.  Interval History:  Tolerated decompressive colonoscopy well yesterday.  Abdomen much softer today.  Denies any nausea/vomiting.  Hoping for starting back on a diet.  Assessment and Plan:  Ileus /atonic colon -possible related to electrolyte abnormality / medications (not on chronic opiates aside from occasional tramadol)  vs neurogenic bowel-sequela of spinal stenosis  - will replete electrolytes as needed - still no abd pain; if occurs will repeat CT A/P - no intervention per surgery after discussion on admission - GI consulted and following, appreciate assistance - Continue serial abdominal exams and imaging - MRI L spine shows foraminal narrowing from L1-S1 - underwent decompression colonoscopy on 7/18; abdomen much softer and less distended today - started on pyridostigmine on 7/18 - trial of CLD today; xray shows  improvement in distension as well    Neurogenic bladder Chronic indwelling foley - suspect UA is colonized; no overt UTI symptoms and does not appear toxic/septic (afebrile and no leukocytosis) - will d/c abx at this time - foley exchanged on 7/16 - appreciate urology evaluation as well  Right hydronephrosis -CT shows right ureteral calculus measuring 7 x 9 x 15 mm with moderate right hydronephrosis -Evaluated by urology and tentative plan for outpatient ureteroscopy unless required to be done inpatient.  Patient currently asymptomatic with stone - renal function also stable    AKI -baseline cr 0.85; creat up to 1.48 on admission -avoid nephrotoxic medication  - continue IVF until diet able to be advanced; if tolerates CLD, will try and d/c IVF soon   Hypertension - continue amlodipine    OSA - noncompliant with cpap -monitor pulse ox , nocturnal O2   Spinal stenosis lumbar region  -with associated myelopathy and chronic debility  -possible cause of atonic colon   Morbid Obesity  - Complicates overall prognosis and care - Body mass index is 52.52 kg/m.   Old records reviewed in assessment of this patient  Antimicrobials: Rocephin 7/16 >> 7/17  DVT prophylaxis:  SCDs Start: 04/30/23 0013   Code Status:   Code Status: Full Code  Mobility Assessment (Last 72 Hours)     Mobility Assessment     Row Name 05/02/23 1100 05/01/23 2015 04/30/23 2130 04/29/23 2339     Does patient have an order for bedrest or is patient medically unstable No - Continue assessment Yes- Bedfast (Level 1) - Complete Yes- Bedfast (Level 1) - Complete Yes- Bedfast (Level 1) - Complete    What is the highest level of mobility  based on the progressive mobility assessment? Level 2 (Chairfast) - Balance while sitting on edge of bed and cannot stand Level 1 (Bedfast) - Unable to balance while sitting on edge of bed Level 1 (Bedfast) - Unable to balance while sitting on edge of bed --    Is the above  level different from baseline mobility prior to current illness? -- Yes - Recommend PT order Yes - Recommend PT order --            Mobility Assessment (Last 72 Hours)     Mobility Assessment     Row Name 05/02/23 1100 05/01/23 2015 04/30/23 2130 04/29/23 2339     Does patient have an order for bedrest or is patient medically unstable No - Continue assessment Yes- Bedfast (Level 1) - Complete Yes- Bedfast (Level 1) - Complete Yes- Bedfast (Level 1) - Complete    What is the highest level of mobility based on the progressive mobility assessment? Level 2 (Chairfast) - Balance while sitting on edge of bed and cannot stand Level 1 (Bedfast) - Unable to balance while sitting on edge of bed Level 1 (Bedfast) - Unable to balance while sitting on edge of bed --    Is the above level different from baseline mobility prior to current illness? -- Yes - Recommend PT order Yes - Recommend PT order --            Mobility Assessment (Last 72 Hours)     Mobility Assessment     Row Name 05/02/23 1100 05/01/23 2015 04/30/23 2130 04/29/23 2339     Does patient have an order for bedrest or is patient medically unstable No - Continue assessment Yes- Bedfast (Level 1) - Complete Yes- Bedfast (Level 1) - Complete Yes- Bedfast (Level 1) - Complete    What is the highest level of mobility based on the progressive mobility assessment? Level 2 (Chairfast) - Balance while sitting on edge of bed and cannot stand Level 1 (Bedfast) - Unable to balance while sitting on edge of bed Level 1 (Bedfast) - Unable to balance while sitting on edge of bed --    Is the above level different from baseline mobility prior to current illness? -- Yes - Recommend PT order Yes - Recommend PT order --             Barriers to discharge: none Disposition Plan:  Home Status is: Inpt  Objective: Blood pressure 129/69, pulse 78, temperature 98.1 F (36.7 C), temperature source Oral, resp. rate 14, height 5\' 10"  (1.778 m),  weight (!) 166 kg, SpO2 97%.  Examination:  Physical Exam Constitutional:      General: He is not in acute distress.    Appearance: Normal appearance. He is obese. He is not ill-appearing.  HENT:     Head: Normocephalic and atraumatic.     Mouth/Throat:     Mouth: Mucous membranes are moist.  Eyes:     Extraocular Movements: Extraocular movements intact.  Cardiovascular:     Rate and Rhythm: Normal rate and regular rhythm.  Pulmonary:     Effort: Pulmonary effort is normal. No respiratory distress.     Breath sounds: Normal breath sounds. No wheezing.  Abdominal:     General: Abdomen is protuberant. There is distension (improved).     Palpations: Abdomen is soft.     Tenderness: There is no abdominal tenderness.     Comments: Improved bowel sounds  Musculoskeletal:        General: Normal  range of motion.     Cervical back: Normal range of motion and neck supple.  Skin:    General: Skin is warm and dry.  Neurological:     General: No focal deficit present.     Mental Status: He is alert.  Psychiatric:        Mood and Affect: Mood normal.        Behavior: Behavior normal.      Consultants:  GI Urology   Procedures:  05/01/2023: Decompressive flex sig  Data Reviewed: Results for orders placed or performed during the hospital encounter of 04/29/23 (from the past 24 hour(s))  Basic metabolic panel     Status: Abnormal   Collection Time: 05/02/23  4:37 AM  Result Value Ref Range   Sodium 139 135 - 145 mmol/L   Potassium 3.0 (L) 3.5 - 5.1 mmol/L   Chloride 106 98 - 111 mmol/L   CO2 25 22 - 32 mmol/L   Glucose, Bld 81 70 - 99 mg/dL   BUN 12 8 - 23 mg/dL   Creatinine, Ser 4.09 0.61 - 1.24 mg/dL   Calcium 8.2 (L) 8.9 - 10.3 mg/dL   GFR, Estimated >81 >19 mL/min   Anion gap 8 5 - 15  CBC with Differential/Platelet     Status: Abnormal   Collection Time: 05/02/23  4:37 AM  Result Value Ref Range   WBC 5.5 4.0 - 10.5 K/uL   RBC 4.45 4.22 - 5.81 MIL/uL   Hemoglobin  10.6 (L) 13.0 - 17.0 g/dL   HCT 14.7 (L) 82.9 - 56.2 %   MCV 79.1 (L) 80.0 - 100.0 fL   MCH 23.8 (L) 26.0 - 34.0 pg   MCHC 30.1 30.0 - 36.0 g/dL   RDW 13.0 (H) 86.5 - 78.4 %   Platelets 367 150 - 400 K/uL   nRBC 0.0 0.0 - 0.2 %   Neutrophils Relative % 67 %   Neutro Abs 3.7 1.7 - 7.7 K/uL   Lymphocytes Relative 20 %   Lymphs Abs 1.1 0.7 - 4.0 K/uL   Monocytes Relative 9 %   Monocytes Absolute 0.5 0.1 - 1.0 K/uL   Eosinophils Relative 3 %   Eosinophils Absolute 0.2 0.0 - 0.5 K/uL   Basophils Relative 0 %   Basophils Absolute 0.0 0.0 - 0.1 K/uL   Immature Granulocytes 1 %   Abs Immature Granulocytes 0.07 0.00 - 0.07 K/uL  Magnesium     Status: None   Collection Time: 05/02/23  4:37 AM  Result Value Ref Range   Magnesium 2.0 1.7 - 2.4 mg/dL    I have reviewed pertinent nursing notes, vitals, labs, and images as necessary. I have ordered labwork to follow up on as indicated.  I have reviewed the last notes from staff over past 24 hours. I have discussed patient's care plan and test results with nursing staff, CM/SW, and other staff as appropriate.  Time spent: Greater than 50% of the 55 minute visit was spent in counseling/coordination of care for the patient as laid out in the A&P.   LOS: 3 days   Lewie Chamber, MD [facility] 05/02/2023, 4:35 PM

## 2023-05-02 NOTE — Progress Notes (Signed)
   Patient Name: [name] [name] Date of Encounter: 05/02/2023, 1:40 PM     Assessment and Plan  Colonic ileus - clinically better s/p decompressive sigmoidoscopy 7/18; and also better by xray, I think - final reading pending  Hypokalemia - back to 3 keep supplementing   Subjective  Says he feels much less distended, breathing better , + flatus and + stool this AM Is turning himself  Objective  BP 130/73 (BP Location: Left Arm)   Pulse 70   Temp 98.5 F (36.9 C) (Oral)   Resp 14   Ht 5\' 10"  (1.778 m)   Wt (!) 166 kg   SpO2 98%   BMI 52.52 kg/m  Lungs cta Cor NL Abd obese and soft, + distended, + BS nontender  Recent Labs  Lab 04/29/23 1747 04/30/23 0048 04/30/23 1222 05/01/23 0502 05/02/23 0437  NA 138 137 139 140 139  K 2.9* 2.9* 3.5 3.5 3.0*  CL 106 107 110 110 106  CO2 23 22 21* 24 25  GLUCOSE 99 95 76 78 81  BUN 27* 26* 22 16 12   CREATININE 1.48* 1.43* 1.30* 1.05 0.89  CALCIUM 8.4* 8.3* 8.1* 8.0* 8.2*  MG  --  2.4  --  2.1 2.0   Lab Results  Component Value Date   WBC 5.5 05/02/2023   HGB 10.6 (L) 05/02/2023   HCT 35.2 (L) 05/02/2023   MCV 79.1 (L) 05/02/2023   PLT 367 05/02/2023   Mg 2   DG abd - my interpretation is persistent ileus w/ decreased caliber of bowel vs yesterday   Iva Boop, MD, [facility] See Loretha Stapler on call - gastroenterology for best contact person 05/02/2023 1:40 PM

## 2023-05-03 ENCOUNTER — Inpatient Hospital Stay (HOSPITAL_COMMUNITY): Payer: Medicare Other

## 2023-05-03 DIAGNOSIS — N189 Chronic kidney disease, unspecified: Secondary | ICD-10-CM | POA: Diagnosis not present

## 2023-05-03 DIAGNOSIS — K592 Neurogenic bowel, not elsewhere classified: Secondary | ICD-10-CM | POA: Diagnosis not present

## 2023-05-03 DIAGNOSIS — R140 Abdominal distension (gaseous): Secondary | ICD-10-CM | POA: Diagnosis not present

## 2023-05-03 DIAGNOSIS — K567 Ileus, unspecified: Secondary | ICD-10-CM | POA: Diagnosis not present

## 2023-05-03 DIAGNOSIS — N179 Acute kidney failure, unspecified: Secondary | ICD-10-CM | POA: Diagnosis not present

## 2023-05-03 LAB — CBC WITH DIFFERENTIAL/PLATELET
Abs Immature Granulocytes: 0.02 10*3/uL (ref 0.00–0.07)
Basophils Absolute: 0 10*3/uL (ref 0.0–0.1)
Basophils Relative: 1 %
Eosinophils Absolute: 0.2 10*3/uL (ref 0.0–0.5)
Eosinophils Relative: 4 %
HCT: 33.7 % — ABNORMAL LOW (ref 39.0–52.0)
Hemoglobin: 10.3 g/dL — ABNORMAL LOW (ref 13.0–17.0)
Immature Granulocytes: 0 %
Lymphocytes Relative: 28 %
Lymphs Abs: 1.4 10*3/uL (ref 0.7–4.0)
MCH: 24.5 pg — ABNORMAL LOW (ref 26.0–34.0)
MCHC: 30.6 g/dL (ref 30.0–36.0)
MCV: 80.2 fL (ref 80.0–100.0)
Monocytes Absolute: 0.6 10*3/uL (ref 0.1–1.0)
Monocytes Relative: 11 %
Neutro Abs: 2.7 10*3/uL (ref 1.7–7.7)
Neutrophils Relative %: 56 %
Platelets: 371 10*3/uL (ref 150–400)
RBC: 4.2 MIL/uL — ABNORMAL LOW (ref 4.22–5.81)
RDW: 17 % — ABNORMAL HIGH (ref 11.5–15.5)
WBC: 4.9 10*3/uL (ref 4.0–10.5)
nRBC: 0 % (ref 0.0–0.2)

## 2023-05-03 LAB — BASIC METABOLIC PANEL
Anion gap: 6 (ref 5–15)
BUN: 11 mg/dL (ref 8–23)
CO2: 27 mmol/L (ref 22–32)
Calcium: 8.1 mg/dL — ABNORMAL LOW (ref 8.9–10.3)
Chloride: 106 mmol/L (ref 98–111)
Creatinine, Ser: 0.92 mg/dL (ref 0.61–1.24)
GFR, Estimated: >60 mL/min (ref >60)
Glucose, Bld: 94 mg/dL (ref 70–99)
Potassium: 3.5 mmol/L (ref 3.5–5.1)
Sodium: 139 mmol/L (ref 135–145)

## 2023-05-03 LAB — CULTURE, BLOOD (ROUTINE X 2)

## 2023-05-03 LAB — MAGNESIUM: Magnesium: 2 mg/dL (ref 1.7–2.4)

## 2023-05-03 MED ORDER — POTASSIUM CHLORIDE CRYS ER 20 MEQ PO TBCR
40.0000 meq | EXTENDED_RELEASE_TABLET | Freq: Once | ORAL | Status: AC
  Administered 2023-05-03: 40 meq via ORAL
  Filled 2023-05-03: qty 2

## 2023-05-03 NOTE — Progress Notes (Signed)
   Patient Name: [name] [name] Date of Encounter: 05/03/2023, 10:13 AM     Assessment and Plan  Colonic ileus - clinically much better s/p decompressive sigmoidoscopy 7/18; and also better by xray,   Hypokalemia - improved  Anemia of chronic disease --------------------------------------------------------------------------------------------------------  I think he is turing the corner  Will d/c pyridostigmine + dulcolax suppository and advance diet  Signing off - I am communicating with primary GI MD Cirigliano about whether to proceed w/ outpt colonoscopy in Aug - I fear will not be able to successfully prep or complete - will contact patient after dc and determine next steps  Stay on a regimen of MiraLax and dulcolax routinely at DC  Iva Boop, MD, [facility] [place] [facility] See AMION on call - gastroenterology for best contact person 05/03/2023 10:16 AM   Subjective  Feeling better again Tol liquids  Objective  BP (!) 142/87 (BP Location: Left Arm)   Pulse 64   Temp 98.7 F (37.1 C) (Oral)   Resp 20   Ht 5\' 10"  (1.778 m)   Wt (!) 166 kg   SpO2 98%   BMI 52.52 kg/m   Abd obese and soft, + distended, + BS nontender  Recent Labs  Lab 04/30/23 0048 04/30/23 1222 05/01/23 0502 05/02/23 0437 05/03/23 0402  NA 137 139 140 139 139  K 2.9* 3.5 3.5 3.0* 3.5  CL 107 110 110 106 106  CO2 22 21* 24 25 27   GLUCOSE 95 76 78 81 94  BUN 26* 22 16 12 11   CREATININE 1.43* 1.30* 1.05 0.89 0.92  CALCIUM 8.3* 8.1* 8.0* 8.2* 8.1*  MG 2.4  --  2.1 2.0 2.0   Lab Results  Component Value Date   WBC 4.9 05/03/2023   HGB 10.3 (L) 05/03/2023   HCT 33.7 (L) 05/03/2023   MCV 80.2 05/03/2023   PLT 371 05/03/2023    Mg 2 DG Abd 1 View CLINICAL DATA:  Ileus.  EXAM: ABDOMEN - 1 VIEW  COMPARISON:  05/01/2023  FINDINGS: There is gaseous distension of large and small bowel loops. There has been significant improvement in dilatation of the large bowel loop in  the central pelvis seen on the study 05/01/2023.  IMPRESSION: Bowel-gas pattern consistent with ileus. Significant improvement in distension of large bowel loop in the central pelvis.  Electronically Signed   By: Norva Pavlov M.D.   On: 05/02/2023 14:15  y   Iva Boop, MD, [facility] [place] [facility] See Loretha Stapler on call - gastroenterology for best contact person 05/03/2023 10:13 AM

## 2023-05-03 NOTE — Progress Notes (Signed)
Progress Note    [name] [name]   ZOX:[id number]  DOB: 1956-09-14  DOA: 04/29/2023     4 PCP: Renford Dills, MD  Initial CC: abdominal distension  Hospital Course: [name] [name] is a 67 yo male with PMH neurogenic bladder with chronic indwelling Foley, recurrent UTI, hydronephrosis, lumbar spinal stenosis with myelopathy, CKD, OSA, morbid obesity. He presented to the ER with worsening abdominal distention/bloating.  He denied any significant nausea or vomiting.  No overt abdominal pain.  He did endorse having small bowel movements prior to hospitalization but could not elaborate on frequency or amount.  He is bedbound at baseline and has caretakers at home. CT abdomen/pelvis was obtained on evaluation and showed significant colonic distention measuring 16 mm.  Case was discussed with general surgery with no recommendations for invasive intervention and recommended GI evaluation. Imaging was also notable for right ureteral calculus with associated moderate right hydronephrosis.  Foley catheter was exchanged in the ER on 04/29/2023.  Urology was also consulted on admission. Due to persistent abdominal distention, he underwent decompression flex sig on 05/01/2023.  He had good results with improvement in distention and improvement on repeat abdominal x-ray.  He continued to have flatus and adequate bowel movements.  He was recommended to continue on a bowel regimen at home.  Interval History:  Continues to do well and improved.  Ongoing improvement of abdominal distention and continues to have adequate bowel movements and flatus.  Tolerated clear liquids yesterday and diet being further advanced today.  Denies any nausea or vomiting.  Assessment and Plan:  Ileus /atonic colon -possible related to electrolyte abnormality / medications (not on chronic opiates aside from occasional tramadol)  vs neurogenic bowel-sequela of spinal stenosis  - will replete electrolytes as needed - still no abd pain; if  occurs will repeat CT A/P - no intervention per surgery after discussion on admission - GI consulted and following, appreciate assistance - Continue serial abdominal exams and imaging - MRI L spine shows foraminal narrowing from L1-S1 - underwent decompression colonoscopy on 7/18; abdomen much softer and less distended today -Pyridostigmine and laxatives discontinued.  Will still need bowel regimen at discharge (MiraLAX and Dulcolax) - Advance diet to soft diet and monitor for tolerance   Neurogenic bladder Chronic indwelling foley - suspect UA is colonized; no overt UTI symptoms and does not appear toxic/septic (afebrile and no leukocytosis) - will d/c abx - foley exchanged on 7/16 - appreciate urology evaluation as well  Right hydronephrosis -CT shows right ureteral calculus measuring 7 x 9 x 15 mm with moderate right hydronephrosis -Evaluated by urology and tentative plan for outpatient ureteroscopy unless required to be done inpatient.  Patient currently asymptomatic with stone - renal function also stable    AKI - resolved -baseline cr 0.85; creat up to 1.48 on admission -avoid nephrotoxic medication  - d/c IVF given diet advancement    Hypertension - continue amlodipine    OSA - noncompliant with cpap -monitor pulse ox , nocturnal O2   Spinal stenosis lumbar region  -with associated myelopathy and chronic debility  -possible cause of atonic colon   Morbid Obesity  - Complicates overall prognosis and care - Body mass index is 52.52 kg/m.   Old records reviewed in assessment of this patient  Antimicrobials: Rocephin 7/16 >> 7/17  DVT prophylaxis:  SCDs Start: 04/30/23 0013   Code Status:   Code Status: Full Code  Mobility Assessment (Last 72 Hours)     Mobility Assessment  Row Name 05/03/23 0730 05/02/23 2000 05/02/23 1100 05/01/23 2015 04/30/23 2130   Does patient have an order for bedrest or is patient medically unstable No - Continue assessment No  - Continue assessment No - Continue assessment Yes- Bedfast (Level 1) - Complete Yes- Bedfast (Level 1) - Complete   What is the highest level of mobility based on the progressive mobility assessment? Level 2 (Chairfast) - Balance while sitting on edge of bed and cannot stand Level 2 (Chairfast) - Balance while sitting on edge of bed and cannot stand Level 2 (Chairfast) - Balance while sitting on edge of bed and cannot stand Level 1 (Bedfast) - Unable to balance while sitting on edge of bed Level 1 (Bedfast) - Unable to balance while sitting on edge of bed   Is the above level different from baseline mobility prior to current illness? -- Yes - Recommend PT order -- Yes - Recommend PT order Yes - Recommend PT order           Mobility Assessment (Last 72 Hours)     Mobility Assessment     Row Name 05/03/23 0730 05/02/23 2000 05/02/23 1100 05/01/23 2015 04/30/23 2130   Does patient have an order for bedrest or is patient medically unstable No - Continue assessment No - Continue assessment No - Continue assessment Yes- Bedfast (Level 1) - Complete Yes- Bedfast (Level 1) - Complete   What is the highest level of mobility based on the progressive mobility assessment? Level 2 (Chairfast) - Balance while sitting on edge of bed and cannot stand Level 2 (Chairfast) - Balance while sitting on edge of bed and cannot stand Level 2 (Chairfast) - Balance while sitting on edge of bed and cannot stand Level 1 (Bedfast) - Unable to balance while sitting on edge of bed Level 1 (Bedfast) - Unable to balance while sitting on edge of bed   Is the above level different from baseline mobility prior to current illness? -- Yes - Recommend PT order -- Yes - Recommend PT order Yes - Recommend PT order           Mobility Assessment (Last 72 Hours)     Mobility Assessment     Row Name 05/03/23 0730 05/02/23 2000 05/02/23 1100 05/01/23 2015 04/30/23 2130   Does patient have an order for bedrest or is patient medically  unstable No - Continue assessment No - Continue assessment No - Continue assessment Yes- Bedfast (Level 1) - Complete Yes- Bedfast (Level 1) - Complete   What is the highest level of mobility based on the progressive mobility assessment? Level 2 (Chairfast) - Balance while sitting on edge of bed and cannot stand Level 2 (Chairfast) - Balance while sitting on edge of bed and cannot stand Level 2 (Chairfast) - Balance while sitting on edge of bed and cannot stand Level 1 (Bedfast) - Unable to balance while sitting on edge of bed Level 1 (Bedfast) - Unable to balance while sitting on edge of bed   Is the above level different from baseline mobility prior to current illness? -- Yes - Recommend PT order -- Yes - Recommend PT order Yes - Recommend PT order           Mobility Assessment (Last 72 Hours)     Mobility Assessment     Row Name 05/03/23 0730 05/02/23 2000 05/02/23 1100 05/01/23 2015 04/30/23 2130   Does patient have an order for bedrest or is patient medically unstable No - Continue assessment No -  Continue assessment No - Continue assessment Yes- Bedfast (Level 1) - Complete Yes- Bedfast (Level 1) - Complete   What is the highest level of mobility based on the progressive mobility assessment? Level 2 (Chairfast) - Balance while sitting on edge of bed and cannot stand Level 2 (Chairfast) - Balance while sitting on edge of bed and cannot stand Level 2 (Chairfast) - Balance while sitting on edge of bed and cannot stand Level 1 (Bedfast) - Unable to balance while sitting on edge of bed Level 1 (Bedfast) - Unable to balance while sitting on edge of bed   Is the above level different from baseline mobility prior to current illness? -- Yes - Recommend PT order -- Yes - Recommend PT order Yes - Recommend PT order            Barriers to discharge: none Disposition Plan:  Home Status is: Inpt  Objective: Blood pressure 128/61, pulse 74, temperature 98 F (36.7 C), temperature source Oral,  resp. rate 20, height 5\' 10"  (1.778 m), weight (!) 166 kg, SpO2 99%.  Examination:  Physical Exam Constitutional:      General: He is not in acute distress.    Appearance: Normal appearance. He is obese. He is not ill-appearing.  HENT:     Head: Normocephalic and atraumatic.     Mouth/Throat:     Mouth: Mucous membranes are moist.  Eyes:     Extraocular Movements: Extraocular movements intact.  Cardiovascular:     Rate and Rhythm: Normal rate and regular rhythm.  Pulmonary:     Effort: Pulmonary effort is normal. No respiratory distress.     Breath sounds: Normal breath sounds. No wheezing.  Abdominal:     General: Abdomen is protuberant. There is no distension.     Palpations: Abdomen is soft.     Tenderness: There is no abdominal tenderness.     Comments: Improved bowel sounds  Musculoskeletal:        General: Normal range of motion.     Cervical back: Normal range of motion and neck supple.  Skin:    General: Skin is warm and dry.  Neurological:     General: No focal deficit present.     Mental Status: He is alert.  Psychiatric:        Mood and Affect: Mood normal.        Behavior: Behavior normal.      Consultants:  GI Urology   Procedures:  05/01/2023: Decompressive flex sig  Data Reviewed: Results for orders placed or performed during the hospital encounter of 04/29/23 (from the past 24 hour(s))  Basic metabolic panel     Status: Abnormal   Collection Time: 05/03/23  4:02 AM  Result Value Ref Range   Sodium 139 135 - 145 mmol/L   Potassium 3.5 3.5 - 5.1 mmol/L   Chloride 106 98 - 111 mmol/L   CO2 27 22 - 32 mmol/L   Glucose, Bld 94 70 - 99 mg/dL   BUN 11 8 - 23 mg/dL   Creatinine, Ser 1.61 0.61 - 1.24 mg/dL   Calcium 8.1 (L) 8.9 - 10.3 mg/dL   GFR, Estimated >09 >60 mL/min   Anion gap 6 5 - 15  CBC with Differential/Platelet     Status: Abnormal   Collection Time: 05/03/23  4:02 AM  Result Value Ref Range   WBC 4.9 4.0 - 10.5 K/uL   RBC 4.20 (L) 4.22  - 5.81 MIL/uL   Hemoglobin 10.3 (L) 13.0 -  17.0 g/dL   HCT 16.6 (L) 06.3 - 01.6 %   MCV 80.2 80.0 - 100.0 fL   MCH 24.5 (L) 26.0 - 34.0 pg   MCHC 30.6 30.0 - 36.0 g/dL   RDW 01.0 (H) 93.2 - 35.5 %   Platelets 371 150 - 400 K/uL   nRBC 0.0 0.0 - 0.2 %   Neutrophils Relative % 56 %   Neutro Abs 2.7 1.7 - 7.7 K/uL   Lymphocytes Relative 28 %   Lymphs Abs 1.4 0.7 - 4.0 K/uL   Monocytes Relative 11 %   Monocytes Absolute 0.6 0.1 - 1.0 K/uL   Eosinophils Relative 4 %   Eosinophils Absolute 0.2 0.0 - 0.5 K/uL   Basophils Relative 1 %   Basophils Absolute 0.0 0.0 - 0.1 K/uL   Immature Granulocytes 0 %   Abs Immature Granulocytes 0.02 0.00 - 0.07 K/uL  Magnesium     Status: None   Collection Time: 05/03/23  4:02 AM  Result Value Ref Range   Magnesium 2.0 1.7 - 2.4 mg/dL    I have reviewed pertinent nursing notes, vitals, labs, and images as necessary. I have ordered labwork to follow up on as indicated.  I have reviewed the last notes from staff over past 24 hours. I have discussed patient's care plan and test results with nursing staff, CM/SW, and other staff as appropriate.  Time spent: Greater than 50% of the 55 minute visit was spent in counseling/coordination of care for the patient as laid out in the A&P.   LOS: 4 days   Lewie Chamber, MD [facility] 05/03/2023, 3:02 PM

## 2023-05-04 DIAGNOSIS — K567 Ileus, unspecified: Secondary | ICD-10-CM | POA: Diagnosis not present

## 2023-05-04 DIAGNOSIS — R140 Abdominal distension (gaseous): Secondary | ICD-10-CM | POA: Diagnosis not present

## 2023-05-04 DIAGNOSIS — N179 Acute kidney failure, unspecified: Secondary | ICD-10-CM | POA: Diagnosis not present

## 2023-05-04 DIAGNOSIS — Z978 Presence of other specified devices: Secondary | ICD-10-CM | POA: Diagnosis not present

## 2023-05-04 MED ORDER — BISACODYL 5 MG PO TBEC: 5.0000 mg | DELAYED_RELEASE_TABLET | Freq: Every day | ORAL | Status: DC | PRN

## 2023-05-04 MED ORDER — POLYETHYLENE GLYCOL 3350 17 G PO PACK: 17.0000 g | PACK | Freq: Every day | ORAL | Status: DC | PRN

## 2023-05-04 MED ORDER — TAMSULOSIN HCL 0.4 MG PO CAPS: 0.4000 mg | ORAL_CAPSULE | Freq: Every day | ORAL | 3 refills | Status: DC

## 2023-05-04 NOTE — Discharge Summary (Signed)
Physician Discharge Summary   [name] [name] ZOX:[id number] DOB: 1956/04/28 DOA: 04/29/2023  PCP: Renford Dills, MD  Admit date: 04/29/2023 Discharge date: 05/04/2023   Admitted From: Home Disposition:  Home Discharging physician: Lewie Chamber, MD Barriers to discharge: none  Recommendations at discharge: Follow up with urology    Discharge Condition: stable CODE STATUS: Full Diet recommendation:  Diet Orders (From admission, onward)     Start     Ordered   05/03/23 1046  DIET SOFT Room service appropriate? Yes; Fluid consistency: Thin  Diet effective now       Question Answer Comment  Room service appropriate? Yes   Fluid consistency: Thin      05/03/23 1045            Hospital Course: [name] [name] is a 67 yo male with PMH neurogenic bladder with chronic indwelling Foley, recurrent UTI, hydronephrosis, lumbar spinal stenosis with myelopathy, CKD, OSA, morbid obesity. He presented to the ER with worsening abdominal distention/bloating.  He denied any significant nausea or vomiting.  No overt abdominal pain.  He did endorse having small bowel movements prior to hospitalization but could not elaborate on frequency or amount.  He is bedbound at baseline and has caretakers at home. CT abdomen/pelvis was obtained on evaluation and showed significant colonic distention measuring 16 mm.  Case was discussed with general surgery with no recommendations for invasive intervention and recommended GI evaluation. Imaging was also notable for right ureteral calculus with associated moderate right hydronephrosis.  Foley catheter was exchanged in the ER on 04/29/2023.  Urology was also consulted on admission. Due to persistent abdominal distention, he underwent decompression flex sig on 05/01/2023.  He had good results with improvement in distention and improvement on repeat abdominal x-ray.  He continued to have flatus and adequate bowel movements.  He was recommended to continue on a bowel regimen  at home.  Assessment and Plan:  Ileus /atonic colon -possible related to electrolyte abnormality / medications (not on chronic opiates aside from occasional tramadol)  vs neurogenic bowel-sequela of spinal stenosis  - no intervention per surgery after discussion on admission - GI consulted - MRI L spine shows foraminal narrowing from L1-S1 - underwent decompression colonoscopy on 7/18; abdomen much softer and less distended today -Pyridostigmine and laxatives discontinued.  Will still need bowel regimen at discharge (MiraLAX and Dulcolax) - Advanced diet and patient tolerated well with ongoing BMs    Neurogenic bladder Chronic indwelling foley - suspect UA is colonized; no overt UTI symptoms and does not appear toxic/septic (afebrile and no leukocytosis) - will d/c abx - foley exchanged on 7/16 - appreciate urology evaluation as well   Right hydronephrosis -CT shows right ureteral calculus measuring 7 x 9 x 15 mm with moderate right hydronephrosis -Evaluated by urology and tentative plan for outpatient ureteroscopy. Patient currently asymptomatic with stone - renal function also stable    AKI - resolved -baseline cr 0.85; creat up to 1.48 on admission -avoid nephrotoxic medication  - d/c IVF given diet advancement    Hypertension - continue amlodipine    OSA - noncompliant with cpap -monitor pulse ox , nocturnal O2   Spinal stenosis lumbar region  -with associated myelopathy and chronic debility  -possible cause of atonic colon   Morbid Obesity  - Complicates overall prognosis and care - Body mass index is 52.52 kg/m.    The patient's chronic medical conditions were treated accordingly per the patient's home medication regimen except as noted.  On day of  discharge, patient was felt deemed stable for discharge. Patient/family member advised to call PCP or come back to ER if needed.   Principal Diagnosis: Ileus [facility])  Discharge Diagnoses: Active Hospital Problems    Diagnosis Date Noted   Ileus (HCC) 04/29/2023    Priority: 1.   Abdominal distension 10/22/2022    Priority: 2.   Chronic indwelling Foley catheter 04/30/2023    Priority: 3.   AKI (acute kidney injury) (HCC)     Priority: 3.   Hypertension    Lumbar stenosis with neurogenic claudication 08/18/2017    Resolved Hospital Problems  No resolved problems to display.     Discharge Instructions     Increase activity slowly   Complete by: As directed       Allergies as of 05/04/2023       Reactions   Unasyn [ampicillin-sulbactam Sodium] Hives, Itching, Swelling, Rash        Medication List     TAKE these medications    acetaminophen 500 MG tablet Commonly known as: TYLENOL Take 1,000 mg by mouth every 6 (six) hours as needed for moderate pain.   amLODipine 5 MG tablet Commonly known as: NORVASC Take 5 mg by mouth every evening.   bisacodyl 5 MG EC tablet Commonly known as: DULCOLAX Take 1 tablet (5 mg total) by mouth daily as needed for moderate constipation.   cetirizine 10 MG tablet Commonly known as: ZYRTEC Take 10 mg by mouth daily as needed for allergies.   FERROUS GLUCONATE PO Take 1 tablet by mouth daily.   polyethylene glycol 17 g packet Commonly known as: MIRALAX / GLYCOLAX Take 17 g by mouth daily as needed.   tamsulosin 0.4 MG Caps capsule Commonly known as: FLOMAX Take 1 capsule (0.4 mg total) by mouth daily.   VITAMIN B-12 PO Take 1 tablet by mouth daily.        Follow-up Information     [facility]. Schedule an appointment as soon as possible for a visit in 1 week(s).   Contact information: [address] [place] [address] [place] [address] [phone]               Allergies  Allergen Reactions   Unasyn [Ampicillin-Sulbactam Sodium] Hives, Itching, Swelling and Rash    Consultations: GI General surgery  Procedures: 05/01/2023: Decompressive flex sig   Discharge Exam: BP 131/80 (BP Location: Right  Arm)   Pulse 66   Temp 98 F (36.7 C) (Oral)   Resp 18   Ht 5\' 10"  (1.778 m)   Wt (!) 166 kg   SpO2 98%   BMI 52.52 kg/m  Physical Exam Constitutional:      General: He is not in acute distress.    Appearance: Normal appearance. He is obese. He is not ill-appearing.  HENT:     Head: Normocephalic and atraumatic.     Mouth/Throat:     Mouth: Mucous membranes are moist.  Eyes:     Extraocular Movements: Extraocular movements intact.  Cardiovascular:     Rate and Rhythm: Normal rate and regular rhythm.  Pulmonary:     Effort: Pulmonary effort is normal. No respiratory distress.     Breath sounds: Normal breath sounds. No wheezing.  Abdominal:     General: Abdomen is protuberant. There is no distension.     Palpations: Abdomen is soft.     Tenderness: There is no abdominal tenderness.     Comments: Improved bowel sounds  Musculoskeletal:  General: Normal range of motion.     Cervical back: Normal range of motion and neck supple.  Skin:    General: Skin is warm and dry.  Neurological:     General: No focal deficit present.     Mental Status: He is alert.  Psychiatric:        Mood and Affect: Mood normal.        Behavior: Behavior normal.      The results of significant diagnostics from this hospitalization (including imaging, microbiology, ancillary and laboratory) are listed below for reference.   Microbiology: Recent Results (from the past 240 hour(s))  Urine Culture     Status: Abnormal   Collection Time: 04/29/23  9:45 PM   Specimen: Urine, Suprapubic  Result Value Ref Range Status   Specimen Description   Final    URINE, SUPRAPUBIC Performed at [facility], [address] [place], [place] [address]    Special Requests   Final    NONE Performed at [facility], [address] [place], [place] [address]    Culture (A)  Final    >=100,000 COLONIES/mL PROTEUS MIRABILIS >=100,000 COLONIES/mL KLEBSIELLA PNEUMONIAE     Report Status 05/02/2023 FINAL  Final   Organism ID, Bacteria PROTEUS MIRABILIS (A)  Final   Organism ID, Bacteria KLEBSIELLA PNEUMONIAE (A)  Final      Susceptibility   Klebsiella pneumoniae - MIC*    AMPICILLIN RESISTANT Resistant     CEFAZOLIN <=4 SENSITIVE Sensitive     CEFEPIME <=0.12 SENSITIVE Sensitive     CEFTRIAXONE <=0.25 SENSITIVE Sensitive     CIPROFLOXACIN <=0.25 SENSITIVE Sensitive     GENTAMICIN <=1 SENSITIVE Sensitive     IMIPENEM 0.5 SENSITIVE Sensitive     NITROFURANTOIN 64 INTERMEDIATE Intermediate     TRIMETH/SULFA <=20 SENSITIVE Sensitive     AMPICILLIN/SULBACTAM 4 SENSITIVE Sensitive     PIP/TAZO <=4 SENSITIVE Sensitive     * >=100,000 COLONIES/mL KLEBSIELLA PNEUMONIAE   Proteus mirabilis - MIC*    AMPICILLIN <=2 SENSITIVE Sensitive     CEFAZOLIN 8 SENSITIVE Sensitive     CEFEPIME <=0.12 SENSITIVE Sensitive     CEFTRIAXONE <=0.25 SENSITIVE Sensitive     CIPROFLOXACIN <=0.25 SENSITIVE Sensitive     GENTAMICIN <=1 SENSITIVE Sensitive     IMIPENEM 2 SENSITIVE Sensitive     NITROFURANTOIN 128 RESISTANT Resistant     TRIMETH/SULFA <=20 SENSITIVE Sensitive     AMPICILLIN/SULBACTAM <=2 SENSITIVE Sensitive     PIP/TAZO <=4 SENSITIVE Sensitive     * >=100,000 COLONIES/mL PROTEUS MIRABILIS  Culture, blood (Routine X 2) w Reflex to ID Panel     Status: None (Preliminary result)   Collection Time: 04/30/23  1:31 AM   Specimen: BLOOD  Result Value Ref Range Status   Specimen Description   Final    BLOOD SITE NOT SPECIFIED Performed at [facility], [address] [place], [place] [address]    Special Requests   Final    BOTTLES DRAWN AEROBIC AND ANAEROBIC Blood Culture results may not be optimal due to an inadequate volume of blood received in culture bottles Performed at [facility], [address] [place], [place] [address]    Culture   Final    NO GROWTH 4 DAYS Performed at [facility], [address]  [place], [place] [address]    Report Status PENDING  Incomplete  Culture, blood (Routine X 2) w Reflex to ID Panel  Status: None (Preliminary result)   Collection Time: 04/30/23  1:33 AM   Specimen: BLOOD LEFT ARM  Result Value Ref Range Status   Specimen Description   Final    BLOOD LEFT ARM Performed at [facility], [address], [place], [place] [address]    Special Requests   Final    BOTTLES DRAWN AEROBIC AND ANAEROBIC Blood Culture results may not be optimal due to an inadequate volume of blood received in culture bottles Performed at [facility], [address], [place], [place] [address]    Culture   Final    NO GROWTH 4 DAYS Performed at [facility], [address] [place], [place] [address]    Report Status PENDING  Incomplete  Gastrointestinal Panel by PCR , Stool     Status: None   Collection Time: 04/30/23  6:35 AM   Specimen: Stool  Result Value Ref Range Status   Campylobacter species NOT DETECTED NOT DETECTED Final   Plesimonas shigelloides NOT DETECTED NOT DETECTED Final   Salmonella species NOT DETECTED NOT DETECTED Final   Yersinia enterocolitica NOT DETECTED NOT DETECTED Final   Vibrio species NOT DETECTED NOT DETECTED Final   Vibrio cholerae NOT DETECTED NOT DETECTED Final   Enteroaggregative E coli (EAEC) NOT DETECTED NOT DETECTED Final   Enteropathogenic E coli (EPEC) NOT DETECTED NOT DETECTED Final   Enterotoxigenic E coli (ETEC) NOT DETECTED NOT DETECTED Final   Shiga like toxin producing E coli (STEC) NOT DETECTED NOT DETECTED Final   Shigella/Enteroinvasive E coli (EIEC) NOT DETECTED NOT DETECTED Final   Cryptosporidium NOT DETECTED NOT DETECTED Final   Cyclospora cayetanensis NOT DETECTED NOT DETECTED Final   Entamoeba histolytica NOT DETECTED NOT DETECTED Final   Giardia lamblia NOT DETECTED NOT DETECTED Final   Adenovirus F40/41 NOT DETECTED NOT DETECTED Final   Astrovirus NOT DETECTED NOT DETECTED Final    Norovirus GI/GII NOT DETECTED NOT DETECTED Final   Rotavirus A NOT DETECTED NOT DETECTED Final   Sapovirus (I, II, IV, and V) NOT DETECTED NOT DETECTED Final    Comment: Performed at [facility], [address] [place], [place] [address]  C Difficile Quick Screen w PCR reflex     Status: None   Collection Time: 04/30/23  6:35 AM   Specimen: STOOL  Result Value Ref Range Status   C Diff antigen NEGATIVE NEGATIVE Final   C Diff toxin NEGATIVE NEGATIVE Final   C Diff interpretation No C. difficile detected.  Final    Comment: Performed at [facility], [address] [place], [place] [address]     Labs: BNP (last 3 results) Recent Labs    04/29/23 1701  BNP 123.5*   Basic Metabolic Panel: Recent Labs  Lab 04/30/23 0048 04/30/23 1222 05/01/23 0502 05/02/23 0437 05/03/23 0402  NA 137 139 140 139 139  K 2.9* 3.5 3.5 3.0* 3.5  CL 107 110 110 106 106  CO2 22 21* 24 25 27   GLUCOSE 95 76 78 81 94  BUN 26* 22 16 12 11   CREATININE 1.43* 1.30* 1.05 0.89 0.92  CALCIUM 8.3* 8.1* 8.0* 8.2* 8.1*  MG 2.4  --  2.1 2.0 2.0   Liver Function Tests: Recent Labs  Lab 04/29/23 1747 04/30/23 0048  AST 19 19  ALT 11 11  ALKPHOS 85 82  BILITOT 0.7 0.6  PROT 8.3* 8.2*  ALBUMIN 3.1* 3.1*   Recent Labs  Lab 04/29/23 1747  LIPASE 32   No results for input(s): "AMMONIA" in the last 168 hours. CBC: Recent Labs  Lab 04/29/23 1747 04/30/23 0048 05/01/23 0502 05/02/23 0437 05/03/23 0402  WBC 6.1 5.8 6.8 5.5 4.9  NEUTROABS  --   --  5.1 3.7 2.7  HGB 11.0* 10.7* 10.5* 10.6* 10.3*  HCT 35.6* 35.1* 34.7* 35.2* 33.7*  MCV 78.9* 79.4* 79.6* 79.1* 80.2  PLT 411* 403* 388 367 371   Cardiac Enzymes: No results for input(s): "CKTOTAL", "CKMB", "CKMBINDEX", "TROPONINI" in the last 168 hours. BNP: Invalid input(s): "POCBNP" CBG: No results for input(s): "GLUCAP" in the last 168 hours. D-Dimer No results for input(s): "DDIMER" in the last 72 hours. Hgb  A1c No results for input(s): "HGBA1C" in the last 72 hours. Lipid Profile No results for input(s): "CHOL", "HDL", "LDLCALC", "TRIG", "CHOLHDL", "LDLDIRECT" in the last 72 hours. Thyroid function studies No results for input(s): "TSH", "T4TOTAL", "T3FREE", "THYROIDAB" in the last 72 hours.  Invalid input(s): "FREET3" Anemia work up No results for input(s): "VITAMINB12", "FOLATE", "FERRITIN", "TIBC", "IRON", "RETICCTPCT" in the last 72 hours. Urinalysis    Component Value Date/Time   COLORURINE YELLOW 04/29/2023 2145   APPEARANCEUR TURBID (A) 04/29/2023 2145   LABSPEC 1.040 (H) 04/29/2023 2145   PHURINE 6.0 04/29/2023 2145   GLUCOSEU NEGATIVE 04/29/2023 2145   HGBUR MODERATE (A) 04/29/2023 2145   BILIRUBINUR NEGATIVE 04/29/2023 2145   KETONESUR NEGATIVE 04/29/2023 2145   PROTEINUR 100 (A) 04/29/2023 2145   UROBILINOGEN 0.2 12/27/2013 2115   NITRITE NEGATIVE 04/29/2023 2145   LEUKOCYTESUR MODERATE (A) 04/29/2023 2145   Sepsis Labs Recent Labs  Lab 04/30/23 0048 05/01/23 0502 05/02/23 0437 05/03/23 0402  WBC 5.8 6.8 5.5 4.9   Microbiology Recent Results (from the past 240 hour(s))  Urine Culture     Status: Abnormal   Collection Time: 04/29/23  9:45 PM   Specimen: Urine, Suprapubic  Result Value Ref Range Status   Specimen Description   Final    URINE, SUPRAPUBIC Performed at [facility], [address], [place], [place] [address]    Special Requests   Final    NONE Performed at [facility], [address] [place], [place] [address]    Culture (A)  Final    >=100,000 COLONIES/mL PROTEUS MIRABILIS >=100,000 COLONIES/mL KLEBSIELLA PNEUMONIAE    Report Status 05/02/2023 FINAL  Final   Organism ID, Bacteria PROTEUS MIRABILIS (A)  Final   Organism ID, Bacteria KLEBSIELLA PNEUMONIAE (A)  Final      Susceptibility   Klebsiella pneumoniae - MIC*    AMPICILLIN RESISTANT Resistant     CEFAZOLIN <=4 SENSITIVE Sensitive     CEFEPIME  <=0.12 SENSITIVE Sensitive     CEFTRIAXONE <=0.25 SENSITIVE Sensitive     CIPROFLOXACIN <=0.25 SENSITIVE Sensitive     GENTAMICIN <=1 SENSITIVE Sensitive     IMIPENEM 0.5 SENSITIVE Sensitive     NITROFURANTOIN 64 INTERMEDIATE Intermediate     TRIMETH/SULFA <=20 SENSITIVE Sensitive     AMPICILLIN/SULBACTAM 4 SENSITIVE Sensitive     PIP/TAZO <=4 SENSITIVE Sensitive     * >=100,000 COLONIES/mL KLEBSIELLA PNEUMONIAE   Proteus mirabilis - MIC*    AMPICILLIN <=2 SENSITIVE Sensitive     CEFAZOLIN 8 SENSITIVE Sensitive     CEFEPIME <=0.12 SENSITIVE Sensitive     CEFTRIAXONE <=0.25 SENSITIVE Sensitive     CIPROFLOXACIN <=0.25 SENSITIVE Sensitive     GENTAMICIN <=1 SENSITIVE Sensitive     IMIPENEM 2 SENSITIVE Sensitive  NITROFURANTOIN 128 RESISTANT Resistant     TRIMETH/SULFA <=20 SENSITIVE Sensitive     AMPICILLIN/SULBACTAM <=2 SENSITIVE Sensitive     PIP/TAZO <=4 SENSITIVE Sensitive     * >=100,000 COLONIES/mL PROTEUS MIRABILIS  Culture, blood (Routine X 2) w Reflex to ID Panel     Status: None (Preliminary result)   Collection Time: 04/30/23  1:31 AM   Specimen: BLOOD  Result Value Ref Range Status   Specimen Description   Final    BLOOD SITE NOT SPECIFIED Performed at [facility], [address] [place], [place] [address]    Special Requests   Final    BOTTLES DRAWN AEROBIC AND ANAEROBIC Blood Culture results may not be optimal due to an inadequate volume of blood received in culture bottles Performed at [facility], [address], [place], [place] [address]    Culture   Final    NO GROWTH 4 DAYS Performed at [facility], [address], [place], [place] [address]    Report Status PENDING  Incomplete  Culture, blood (Routine X 2) w Reflex to ID Panel     Status: None (Preliminary result)   Collection Time: 04/30/23  1:33 AM   Specimen: BLOOD LEFT ARM  Result Value Ref Range Status   Specimen Description   Final    BLOOD LEFT  ARM Performed at [facility], [address], [place], [place] [address]    Special Requests   Final    BOTTLES DRAWN AEROBIC AND ANAEROBIC Blood Culture results may not be optimal due to an inadequate volume of blood received in culture bottles Performed at [facility], [address], [place], [place] [address]    Culture   Final    NO GROWTH 4 DAYS Performed at [facility], [address], [place], [place] [address]    Report Status PENDING  Incomplete  Gastrointestinal Panel by PCR , Stool     Status: None   Collection Time: 04/30/23  6:35 AM   Specimen: Stool  Result Value Ref Range Status   Campylobacter species NOT DETECTED NOT DETECTED Final   Plesimonas shigelloides NOT DETECTED NOT DETECTED Final   Salmonella species NOT DETECTED NOT DETECTED Final   Yersinia enterocolitica NOT DETECTED NOT DETECTED Final   Vibrio species NOT DETECTED NOT DETECTED Final   Vibrio cholerae NOT DETECTED NOT DETECTED Final   Enteroaggregative E coli (EAEC) NOT DETECTED NOT DETECTED Final   Enteropathogenic E coli (EPEC) NOT DETECTED NOT DETECTED Final   Enterotoxigenic E coli (ETEC) NOT DETECTED NOT DETECTED Final   Shiga like toxin producing E coli (STEC) NOT DETECTED NOT DETECTED Final   Shigella/Enteroinvasive E coli (EIEC) NOT DETECTED NOT DETECTED Final   Cryptosporidium NOT DETECTED NOT DETECTED Final   Cyclospora cayetanensis NOT DETECTED NOT DETECTED Final   Entamoeba histolytica NOT DETECTED NOT DETECTED Final   Giardia lamblia NOT DETECTED NOT DETECTED Final   Adenovirus F40/41 NOT DETECTED NOT DETECTED Final   Astrovirus NOT DETECTED NOT DETECTED Final   Norovirus GI/GII NOT DETECTED NOT DETECTED Final   Rotavirus A NOT DETECTED NOT DETECTED Final   Sapovirus (I, II, IV, and V) NOT DETECTED NOT DETECTED Final    Comment: Performed at [facility], [address], [place], [place] [address]  C Difficile Quick Screen w PCR reflex      Status: None   Collection Time: 04/30/23  6:35 AM   Specimen: STOOL  Result Value Ref Range Status   C Diff antigen NEGATIVE NEGATIVE Final   C Diff toxin NEGATIVE NEGATIVE Final   C Diff interpretation No C. difficile detected.  Final    Comment: Performed at [facility], [address], [place], [place] [address]    Procedures/Studies: Varney Biles Abd Portable 1V  Result Date: 05/03/2023 CLINICAL DATA:  Follow-up ileus EXAM: PORTABLE ABDOMEN - 1 VIEW COMPARISON:  05/02/2023 FINDINGS: Scattered large and small bowel gas is noted. Persistent dilatation in the left abdomen of the colon is noted stable from the previous day. No new focal abnormality is seen. Bony structures are stable. IMPRESSION: Stable changes of ileus when compared with the prior day. Electronically Signed   By: Alcide Clever M.D.   On: 05/03/2023 16:58   DG Abd 1 View  Result Date: 05/02/2023 CLINICAL DATA:  Ileus. EXAM: ABDOMEN - 1 VIEW COMPARISON:  05/01/2023 FINDINGS: There is gaseous distension of large and small bowel loops. There has been significant improvement in dilatation of the large bowel loop in the central pelvis seen on the study 05/01/2023. IMPRESSION: Bowel-gas pattern consistent with ileus. Significant improvement in distension of large bowel loop in the central pelvis. Electronically Signed   By: Norva Pavlov M.D.   On: 05/02/2023 14:15   DG Abd 1 View  Result Date: 05/01/2023 CLINICAL DATA:  Abdominal distention EXAM: ABDOMEN - 1 VIEW COMPARISON:  Previous studies including the examination of 04/30/2023 FINDINGS: There is marked gaseous distention of colon with interval worsening. There is no definite demonstrable small bowel dilation. Stomach is not distended. IMPRESSION: There is marked gaseous distention of colon with interval worsening, possibly suggesting worsening ileus or distal colonic obstruction. Electronically Signed   By: Ernie Avena M.D.   On: 05/01/2023 09:08   DG Abd 1  View  Result Date: 04/30/2023 CLINICAL DATA:  Abdominal pain EXAM: ABDOMEN - 1 VIEW COMPARISON:  CT done on 04/29/2023 FINDINGS: There is marked gaseous distention of colon. There is no significant small bowel dilation. Stomach is not distended. Surgical clips are seen in right upper quadrant. In the previous CT, calculus was seen in the right ureter which could not be clearly identified in the radiographs. IMPRESSION: There is marked gaseous distention of colon suggesting possible ileus. Right ureteral calculus is not distinctly visualized and not evaluated. Electronically Signed   By: Ernie Avena M.D.   On: 04/30/2023 17:07   MR Lumbar Spine W Wo Contrast  Result Date: 04/30/2023 CLINICAL DATA:  Myelopathy, acute, lumbar spine. EXAM: MRI LUMBAR SPINE WITHOUT AND WITH CONTRAST TECHNIQUE: Multiplanar and multiecho pulse sequences of the lumbar spine were obtained without and with intravenous contrast. CONTRAST:  10mL GADAVIST GADOBUTROL 1 MMOL/ML IV SOLN COMPARISON:  MRI of the lumbar spine November 03, 2018. FINDINGS: Segmentation:  Standard. Alignment: Exaggerated lumbar lordosis with small anterolisthesis of L4 over L5 and L5 over S1. Vertebrae: No fracture, evidence of discitis, or bone lesion. Congenitally small spinal canal. L2-3, L3-4, L4-5 and L5-S1 laminectomies. Loss of disc height with partial endplate fusion at L4-5 and L5-S1. Prominent posterior element hypertrophy in the lower lumbar spine. Conus medullaris and cauda equina: Conus extends to the L2 level. Conus appear normal. Paraspinal and other soft tissues: Diffuse fatty atrophy of the paraspinal muscles. Disc levels: T12-L1: Shallow disc bulge and moderate hypertrophic facet degenerative changes resulting in mild spinal canal stenosis and mild bilateral neural foraminal narrowing. L1-2: Shallow disc bulge and moderate hypertrophic facet degenerative changes resulting in mild  spinal canal stenosis and severe bilateral neural foraminal  narrowing. L2-3: Disc bulge and prominent posterior element hypertrophy resulting in severe spinal canal stenosis and severe bilateral neural foraminal narrowing. L3-4: Disc bulge and prominent posterior elements hypertrophy resulting in severe spinal canal stenosis, severe right and moderate left neural foraminal narrowing. L4-5: Anterolisthesis, endplate ridging and posterior elements hypertrophy and epidural lipomatosis. Findings result in moderate to severe narrowing of the thecal sac, prominent narrowing of the bilateral subarticular zones, severe right and moderate left neural foraminal narrowing. L5-S1: Small anterolisthesis, endplate ridging and posterior element hypertrophy resulting in moderate bilateral neural foraminal narrowing. Mild spinal canal stenosis. IMPRESSION: 1. Degenerative changes of the lumbar spine superimposed on a congenitally small spinal canal resulting in severe spinal canal stenosis at L2-3 and L3-4 and moderate to severe narrowing of the thecal sac at L4-5. 2. Severe bilateral neural foraminal narrowing at L1-2 and L2-3. 3. Severe right and moderate left neural foraminal narrowing at L3-4 and L4-5. 4. Moderate bilateral neural foraminal narrowing at L5-S1. Electronically Signed   By: Baldemar Lenis M.D.   On: 04/30/2023 15:08   CT ABDOMEN PELVIS W CONTRAST  Result Date: 04/29/2023 CLINICAL DATA:  Ascites, abdominal distension, dyspnea EXAM: CT ABDOMEN AND PELVIS WITH CONTRAST TECHNIQUE: Multidetector CT imaging of the abdomen and pelvis was performed using the standard protocol following bolus administration of intravenous contrast. RADIATION DOSE REDUCTION: This exam was performed according to the departmental dose-optimization program which includes automated exposure control, adjustment of the mA and/or kV according to patient size and/or use of iterative reconstruction technique. CONTRAST:  OMNIPAQUE IOHEXOL 300 MG/ML  SOLN COMPARISON:  03/27/2021  FINDINGS: Lower chest: No acute abnormality. Mild cardiomegaly. Small hiatal hernia. Hepatobiliary: No focal liver abnormality is seen. Status post cholecystectomy. No biliary dilatation. Pancreas: Unremarkable Spleen: Unremarkable Adrenals/Urinary Tract: The adrenal glands are unremarkable. Kidneys are normal in position. Mild bilateral cortical atrophy. Previously noted elongate right ureteral calculus has migrated slightly distally now at the level of the right transverse process of L5, measuring 7 x 9 x 15 mm. There has developed moderate right hydronephrosis. Additionally, there is marked proximal urothelial thickening and mild peripelvic and periureteric inflammatory stranding in keeping with urothelial inflammation, as can be seen with superimposed infection. Additional bilateral nonobstructing renal calculi are identified measuring up to 14 mm bilaterally. No hydronephrosis on the left. No ureteral calculi on the left. Mild bilateral nonspecific perinephric stranding again noted with no loculated perinephric fluid collection identified. Foley catheter balloon is seen within a decompressed bladder lumen. 7 mm bladder calculus now identified within the decompressed bladder lumen. Stomach/Bowel: There is marked gaseous distension of the colon without evidence of obstruction most suggestive of a colonic ileus. This appears progressive since prior examination and the transverse colon measures up to 16 mm in diameter. Surgical changes of gastric sleeve resection are identified. The small bowel is unremarkable. Appendix is unremarkable. No free intraperitoneal gas or fluid. Vascular/Lymphatic: Aortic atherosclerosis. No enlarged abdominal or pelvic lymph nodes. Reproductive: Prostate is unremarkable. Other: No abdominal wall hernia. Mild nonspecific subcutaneous edema within the pannus. Musculoskeletal: Degenerative and postsurgical changes are noted within the lumbar spine with ankylosis of the vertebral bodies  and posterior elements of L4-S1. Bridging osteophytes are noted within the visualized thoracolumbar spine from a T7 through L1. No acute bone abnormality. No lytic or blastic bone lesion. IMPRESSION: 1. Interval distal migration of the previously noted elongate right ureteral calculus now at the level of the right transverse  process of L5, measuring 7 x 9 x 15 mm. Interval development of moderate right hydronephrosis. Additionally, there is marked proximal urothelial thickening and mild peripelvic and periureteric inflammatory stranding in keeping with urothelial inflammation, as can be seen with superimposed infection. Correlation with urinalysis and urine culture may be helpful for further management. 2. Superimposed moderate bilateral nonobstructing nephrolithiasis. 3. 7 mm bladder calculus now identified within the decompressed bladder lumen in keeping with a recently passed calculus. 4. Progressive gaseous distension of the colon without evidence of obstruction most suggestive of a colonic ileus. Transverse colon measures up to 16 cm in diameter. 5. Mild cardiomegaly. Small hiatal hernia. 6. Aortic atherosclerosis. Aortic Atherosclerosis (ICD10-I70.0). Electronically Signed   By: Helyn Numbers M.D.   On: 04/29/2023 21:02   DG Chest Port 1 View  Result Date: 04/29/2023 CLINICAL DATA:  Shortness of breath over the last several weeks EXAM: PORTABLE CHEST 1 VIEW COMPARISON:  12/25/2018 FINDINGS: Cardiomegaly with left ventricular prominence. The patient has taken a poor inspiration. The upper lungs are clear. Possible mild atelectasis or infiltrate at the lung bases. No evidence of heart failure or effusion. Consider two-view chest radiography if concern persists. IMPRESSION: Cardiomegaly with left ventricular prominence. Poor inspiration. Possible mild atelectasis or infiltrate at the lung bases. Electronically Signed   By: Paulina Fusi M.D.   On: 04/29/2023 17:10     Time coordinating discharge: Over 30  minutes    Lewie Chamber, MD  [facility] 05/04/2023, 3:08 PM

## 2023-05-05 ENCOUNTER — Telehealth: Payer: Self-pay | Admitting: *Deleted

## 2023-05-05 LAB — CULTURE, BLOOD (ROUTINE X 2): Culture: NO GROWTH

## 2023-05-05 NOTE — Telephone Encounter (Signed)
Yesi,  This pt's BMI is greater than 50; their procedure will need to be performed at the hospital.  Thanks,  John Nulty 

## 2023-05-06 ENCOUNTER — Telehealth: Payer: Self-pay

## 2023-05-06 NOTE — Telephone Encounter (Signed)
-----   Message from Shellia Cleverly sent at 05/05/2023  4:36 PM EDT ----- Regarding: FW: colonoscopy??? Let's go ahead and cancel his colo for August and instead schedule a f/u appt with me in the office. Thanks. ----- Message ----- From: Iva Boop, MD Sent: 05/03/2023  10:11 AM EDT To: Shellia Cleverly, DO Subject: colonoscopy???                                 Vito,  He was in w/ severe colonic ileus - looking at current films and those in 2022 I would have to say I doubt a colonoscopy w/ good prep and ability to reach cecum will be feasible.  I have explained this to him and that I would send this on to you.  Not saying do not try but I don't think I would.  He has a kidney stone and will need urology intervention but they anticipate doing as outpatient - that may impact currently scheduled colonoscopy.  He is certainly one of the nicest people I have rounded on this week.  Julious Payer

## 2023-05-06 NOTE — Telephone Encounter (Signed)
LVM to the patient to call back. Scheduled hospital colon on 8/12 is cancelled,  Previsit is cancelled as well. Follow up appointment is scheduled with Dr. Barron Alvine on 8/15 at 10 AM.

## 2023-05-07 ENCOUNTER — Encounter (HOSPITAL_COMMUNITY): Payer: Self-pay

## 2023-05-09 NOTE — Telephone Encounter (Signed)
Spoke with patient's wife [place]. Made her aware that patient is scheduled with Dr. Barron Alvine for a follow up visit on 8/15 at 10 AM. Appointment for hospital colon on 8/12, and Previsit on 7/29 both are  cancelled as well. [name] verbalized understanding.

## 2023-05-26 ENCOUNTER — Other Ambulatory Visit (HOSPITAL_COMMUNITY): Payer: Self-pay | Admitting: Internal Medicine

## 2023-05-26 ENCOUNTER — Encounter (HOSPITAL_COMMUNITY): Payer: Self-pay

## 2023-05-26 ENCOUNTER — Ambulatory Visit (HOSPITAL_COMMUNITY): Admit: 2023-05-26 | Payer: Medicare Other | Admitting: Gastroenterology

## 2023-05-26 DIAGNOSIS — E8779 Other fluid overload: Secondary | ICD-10-CM

## 2023-05-26 SURGERY — COLONOSCOPY WITH PROPOFOL
Anesthesia: Monitor Anesthesia Care

## 2023-05-29 ENCOUNTER — Ambulatory Visit (INDEPENDENT_AMBULATORY_CARE_PROVIDER_SITE_OTHER): Payer: Medicare Other | Admitting: Gastroenterology

## 2023-05-29 ENCOUNTER — Encounter: Payer: Self-pay | Admitting: Gastroenterology

## 2023-05-29 VITALS — BP 130/90 | HR 65 | Ht 70.0 in | Wt 356.0 lb

## 2023-05-29 DIAGNOSIS — R197 Diarrhea, unspecified: Secondary | ICD-10-CM

## 2023-05-29 DIAGNOSIS — R195 Other fecal abnormalities: Secondary | ICD-10-CM | POA: Diagnosis not present

## 2023-05-29 NOTE — Patient Instructions (Addendum)
Follow up as needed.  Your provider has ordered "Diatherix" stool testing for you. You have received a kit from our office today containing all necessary supplies to complete this test. Please carefully read the stool collection instructions provided in the kit before opening the accompanying materials. In addition, be sure to place the label from the top left corner of the laboratory request sheet onto the "puritan opti-swab" tube that is supplied in the kit. This label should include your full name and date of birth. After completing the test, you should secure the purtian tube into the specimen biohazard bag. The laboratory request information sheet (including date and time of specimen collection) should be placed into the outside pocket of the specimen biohazard bag and returned to the [facility] lab with 2 days of collection.   _______________________________________________________  If your blood pressure at your visit was 140/90 or greater, please contact your primary care physician to follow up on this.  _______________________________________________________  If you are age 65 or older, your body mass index should be between 23-30. Your Body mass index is 51.08 kg/m. If this is out of the aforementioned range listed, please consider follow up with your Primary Care Provider. _________________________________________________________  The [facility] providers would like to encourage you to use [facility] to communicate with providers for non-urgent requests or questions.  Due to long hold times on the telephone, sending your provider a message by [facility] may be a faster and more efficient way to get a response.  Please allow 48 business hours for a response.  Please remember that this is for non-urgent requests.   Due to recent changes in healthcare laws, you may see the results of your imaging and laboratory studies on MyChart before your provider has had a chance to review them.  We understand that  in some cases there may be results that are confusing or concerning to you. Not all laboratory results come back in the same time frame and the provider may be waiting for multiple results in order to interpret others.  Please give [place] 48 hours in order for your provider to thoroughly review all the results before contacting the office for clarification of your results.     Thank you for choosing me and [facility].  Vito Cirigliano, D.O.

## 2023-05-29 NOTE — Progress Notes (Signed)
Chief Complaint:    Hospital follow-up   HPI:     Patient is a 67 y.o. male with a history of CKD 3, HTN, obesity, OSA, history of gastric sleeve, nephrolithiasis, neurogenic bladder with chronic Foley, morbid obesity (BMI 51), GERD, presenting to the [facility] for hospital follow-up.   Hospital admission in 04/2023 with colonic ileus requiring pyridostigmine and decompressive flexible sigmoidoscopy.  Case discussed with colleagues and given anatomy, inability to prep, etc., recommended against future attempts at colonoscopy.  Was discharged with MiraLAX and Dulcolax.  Today, he states abdominal pain resolved.  No longer having constipation, but now has loose to water, non-bloody stools. Started a few days after hospital discharge.  Stopped taking Miralax and Dulcolax as soon as the diarrhea started, and no appreciable change in sxs. Has 2 BM/day, which is baseline frequency, but change in consistency. No nocturnal stools. Otherwise, no change in diet or meds. Has ordered fiber supplement, but not yet started.   Appetite stable and no UGI sxs.   Has f/u with Urology for kidney stone with plan for surgery pending further w/u.   Endoscopic History: - 2012: Colonoscopy at [facility].  No record available for review. - 11/2022: EGD: Normal esophagus.  Healthy-appearing sleeve gastrectomy, normal duodenum   Review of systems:     No chest pain, no SOB, no fevers, no urinary sx   Past Medical History:  Diagnosis Date   Anemia    Arthritis    Chronic kidney disease    HX acute kidney failure / acute pyelonephritis / hydronephrosis / severe sepsis per discharge summary 12/27/13   GERD (gastroesophageal reflux disease)    History of kidney stones    Hypertension    Morbid obesity (HCC)    Obstructive sleep apnea    does not need c pap since 110 lb wt loss   Osteoporosis    Prurigo 2002   Scars    ON ARMS FROM CHEMICAL EXPLOSION 1999   Spinal stenosis     Patient's  surgical history, family medical history, social history, medications and allergies were all reviewed in Epic    Current Outpatient Medications  Medication Sig Dispense Refill   acetaminophen (TYLENOL) 500 MG tablet Take 1,000 mg by mouth every 6 (six) hours as needed for moderate pain.     amLODipine (NORVASC) 5 MG tablet Take 5 mg by mouth every evening.     cetirizine (ZYRTEC) 10 MG tablet Take 10 mg by mouth daily as needed for allergies.     Cyanocobalamin (VITAMIN B-12 PO) Take 1 tablet by mouth daily.     FERROUS GLUCONATE PO Take 1 tablet by mouth daily.     tamsulosin (FLOMAX) 0.4 MG CAPS capsule Take 1 capsule (0.4 mg total) by mouth daily. 30 capsule 3   No current facility-administered medications for this visit.    Physical Exam:     BP (!) 130/90   Pulse 65   Ht 5\' 10"  (1.778 m)   Wt (!) 356 lb (161.5 kg)   SpO2 96%   BMI 51.08 kg/m   GENERAL:  Pleasant male in NAD, in wheelchair PSYCH: : Cooperative, normal affect ABDOMEN: Protuberant, soft, nontender. Normal bowel sounds NEURO: Alert and oriented x 3   IMPRESSION and PLAN:    1) Change in bowel habits 2) Diarrhea - GI PCR via Diatherix, C diff panel, particular given recent hospitalization  3) Colon cancer screening Discussed options for CRC screening along with risk/benefit profile of each  screening tool.  Discussed role of optical colonoscopy at length.  Given elevated risks from underlying comorbidities along with difficulty with bowel preparation in the past, he declined any further CRC screening.  Similarly, we discussed the role of Cologuard, but if positive, he would not want to go through colonoscopy again, so no role in noninvasive stool studies or even virtual colonoscopy either. - As above, no further CRC screening due to risks outweighing benefits  4) History of colonic ileus Hospitalization 04/2023 requiring pyridostigmine and endoscopic decompression.    I spent 35 minutes of time, including  in depth chart review, independent review of results as outlined above, communicating results with the patient directly, face-to-face time with the patient, coordinating care, and ordering studies and medications as appropriate, and documentation.       Shellia Cleverly ,DO, FACG 05/29/2023, 10:24 AM

## 2023-06-04 ENCOUNTER — Other Ambulatory Visit: Payer: Self-pay | Admitting: Urology

## 2023-06-06 ENCOUNTER — Telehealth: Payer: Self-pay

## 2023-06-06 NOTE — Telephone Encounter (Signed)
Spoke with Patient's wife. Made her aware that Diatherix stool test for the patient came back negative per Dr. Tresa Garter. Test result sent to Scan.

## 2023-06-18 ENCOUNTER — Encounter: Payer: Self-pay | Admitting: Gastroenterology

## 2023-06-19 ENCOUNTER — Other Ambulatory Visit (HOSPITAL_COMMUNITY): Payer: Medicare Other

## 2023-06-24 ENCOUNTER — Ambulatory Visit (HOSPITAL_COMMUNITY): Payer: Medicare Other

## 2023-06-24 NOTE — Progress Notes (Signed)
COVID Vaccine received:  []  No [x]  Yes Date of any COVID positive Test in last 90 days: YES- August 2024  PCP - Renford Dills MD Cardiologist - no  Chest x-ray - 04/29/23 Epic EKG -  04/29/23 Epic Stress Test -  ECHO - 05/26/23 Epic Cardiac Cath - no  Bowel Prep -[x]   No  []   Yes ______  Pacemaker / ICD device [x]  No []  Yes   Spinal Cord Stimulator:[x]  No []  Yes       History of Sleep Apnea? []  No [x]  Yes   CPAP used?- [x]  No []  Yes    Does the patient monitor blood sugar?     [x]       No []  Yes  []  N/A  Patient has: [x]  NO Hx DM   []  Pre-DM                 []  DM1  []   DM2 Does patient have a [employer] or Dexacom? []  No []  Yes   Fasting Blood Sugar Ranges-  Checks Blood Sugar _____ times a day  GLP1 agonist / usual dose - no GLP1 instructions:  SGLT-2 inhibitors / usual dose - no SGLT-2 instructions:   Blood Thinner / Instructions:no Aspirin Instructions:no  Comments:   Activity level: Patient is unable to climb a flight of stairs without difficulty; [x]  No CP  []  No SOB,has spinal stenosis and in w/c  Patient can not perform ADLs without assistance.   Anesthesia review:   Patient denies shortness of breath, fever, cough and chest pain at PAT appointment.  Patient verbalized understanding and agreement to the Pre-Surgical Instructions that were given to them at this PAT appointment. Patient was also educated of the need to review these PAT instructions again prior to his/her surgery.I reviewed the appropriate phone numbers to call if they have any and questions or concerns.

## 2023-06-24 NOTE — Patient Instructions (Signed)
SURGICAL WAITING ROOM VISITATION  Patients having surgery or a procedure may have no more than 2 support people in the waiting area - these visitors may rotate.    Children under the age of 56 must have an adult with them who is not the patient.  Due to an increase in RSV and influenza rates and associated hospitalizations, children ages 57 and under may not visit patients in [facility] hospitals.  If the patient needs to stay at the hospital during part of their recovery, the visitor guidelines for inpatient rooms apply. Pre-op nurse will coordinate an appropriate time for 1 support person to accompany patient in pre-op.  This support person may not rotate.    Please refer to the [employer] website for the visitor guidelines for Inpatients (after your surgery is over and you are in a regular room).       Your procedure is scheduled on: 07/08/23   Report to [facility] Main Entrance    Report to admitting at 5:15 AM   Call this number if you have problems the morning of surgery [phone]   Do not eat food or drink liquids:After Midnight.  Oral Hygiene is also important to reduce your risk of infection.                                    Remember - BRUSH YOUR TEETH THE MORNING OF SURGERY WITH YOUR REGULAR TOOTHPASTE  DENTURES WILL BE REMOVED PRIOR TO SURGERY PLEASE DO NOT APPLY "Poly grip" OR ADHESIVES!!!   Do NOT smoke after Midnight   Stop all vitamins and herbal supplements 7 days before surgery.   Take these medicines the morning of surgery with A SIP OF WATER: Amlodipine, Cetirizine, Tamsulosin, Tylenol if needed  Bring CPAP mask and tubing day of surgery.                              You may not have any metal on your body including hair pins, jewelry, and body piercing             Do not wear lotions, powders,cologne, or deodorant              Men may shave face and neck.   Do not bring valuables to the hospital. [facility] IS NOT              RESPONSIBLE   FOR VALUABLES.   Contacts, glasses, dentures or bridgework may not be worn into surgery.  DO NOT BRING YOUR HOME MEDICATIONS TO THE HOSPITAL. PHARMACY WILL DISPENSE MEDICATIONS LISTED ON YOUR MEDICATION LIST TO YOU DURING YOUR ADMISSION IN THE HOSPITAL!    Patients discharged on the day of surgery will not be allowed to drive home.  Someone NEEDS to stay with you for the first 24 hours after anesthesia.   Special Instructions: Bring a copy of your healthcare power of attorney and living will documents the day of surgery if you haven't scanned them before.              Please read over the following fact sheets you were given: IF YOU HAVE QUESTIONS ABOUT YOUR PRE-OP INSTRUCTIONS PLEASE CALL [phone] Rosey [place]   If you received a COVID test during your pre-op visit  it is requested that you wear a mask when out in public, stay away from anyone  that may not be feeling well and notify your surgeon if you develop symptoms. If you test positive for Covid or have been in contact with anyone that has tested positive in the last 10 days please notify you surgeon.    [facility] - Preparing for Surgery Before surgery, you can play an important role.  Because skin is not sterile, your skin needs to be as free of germs as possible.  You can reduce the number of germs on your skin by washing with CHG (chlorahexidine gluconate) soap before surgery.  CHG is an antiseptic cleaner which kills germs and bonds with the skin to continue killing germs even after washing. Please DO NOT use if you have an allergy to CHG or antibacterial soaps.  If your skin becomes reddened/irritated stop using the CHG and inform your nurse when you arrive at Short Stay. Do not shave (including legs and underarms) for at least 48 hours prior to the first CHG shower.  You may shave your face/neck.  Please follow these instructions carefully:  1.  Shower with CHG Soap the night before surgery and the  morning of  surgery.  2.  If you choose to wash your hair, wash your hair first as usual with your normal  shampoo.  3.  After you shampoo, rinse your hair and body thoroughly to remove the shampoo.                             4.  Use CHG as you would any other liquid soap.  You can apply chg directly to the skin and wash.  Gently with a scrungie or clean washcloth.  5.  Apply the CHG Soap to your body ONLY FROM THE NECK DOWN.   Do   not use on face/ open                           Wound or open sores. Avoid contact with eyes, ears mouth and   genitals (private parts).                       Wash face,  Genitals (private parts) with your normal soap.             6.  Wash thoroughly, paying special attention to the area where your    surgery  will be performed.  7.  Thoroughly rinse your body with warm water from the neck down.  8.  DO NOT shower/wash with your normal soap after using and rinsing off the CHG Soap.                9.  Pat yourself dry with a clean towel.            10.  Wear clean pajamas.            11.  Place clean sheets on your bed the night of your first shower and do not  sleep with pets. Day of Surgery : Do not apply any lotions/deodorants the morning of surgery.  Please wear clean clothes to the hospital/surgery center.  FAILURE TO FOLLOW THESE INSTRUCTIONS MAY RESULT IN THE CANCELLATION OF YOUR SURGERY  PATIENT SIGNATURE_________________________________  NURSE SIGNATURE__________________________________  ________________________________________________________________________

## 2023-06-26 ENCOUNTER — Encounter (HOSPITAL_COMMUNITY)
Admission: RE | Admit: 2023-06-26 | Discharge: 2023-06-26 | Disposition: A | Discharge status: Home / Self Care | Payer: Medicare Other | Source: Ambulatory Visit | Attending: Urology | Admitting: Urology

## 2023-06-26 ENCOUNTER — Other Ambulatory Visit: Payer: Self-pay

## 2023-06-26 ENCOUNTER — Encounter (HOSPITAL_COMMUNITY): Payer: Self-pay

## 2023-06-26 VITALS — BP 150/93 | HR 64 | Resp 20 | Ht 70.0 in | Wt 366.0 lb

## 2023-06-26 DIAGNOSIS — I10 Essential (primary) hypertension: Secondary | ICD-10-CM | POA: Insufficient documentation

## 2023-06-26 DIAGNOSIS — Z01812 Encounter for preprocedural laboratory examination: Secondary | ICD-10-CM | POA: Diagnosis present

## 2023-06-26 LAB — BASIC METABOLIC PANEL
Anion gap: 6 (ref 5–15)
BUN: 16 mg/dL (ref 8–23)
CO2: 25 mmol/L (ref 22–32)
Calcium: 8.6 mg/dL — ABNORMAL LOW (ref 8.9–10.3)
Chloride: 106 mmol/L (ref 98–111)
Creatinine, Ser: 0.74 mg/dL (ref 0.61–1.24)
GFR, Estimated: >60 mL/min (ref >60)
Glucose, Bld: 92 mg/dL (ref 70–99)
Potassium: 3.9 mmol/L (ref 3.5–5.1)
Sodium: 137 mmol/L (ref 135–145)

## 2023-06-26 LAB — CBC
HCT: 34.4 % — ABNORMAL LOW (ref 39.0–52.0)
Hemoglobin: 10.5 g/dL — ABNORMAL LOW (ref 13.0–17.0)
MCH: 25.1 pg — ABNORMAL LOW (ref 26.0–34.0)
MCHC: 30.5 g/dL (ref 30.0–36.0)
MCV: 82.1 fL (ref 80.0–100.0)
Platelets: 289 10*3/uL (ref 150–400)
RBC: 4.19 MIL/uL — ABNORMAL LOW (ref 4.22–5.81)
RDW: 19.5 % — ABNORMAL HIGH (ref 11.5–15.5)
WBC: 3.9 10*3/uL — ABNORMAL LOW (ref 4.0–10.5)
nRBC: 0 % (ref 0.0–0.2)

## 2023-07-07 NOTE — H&P (Signed)
CC: Urinary retention, blood in catheter bag   HPI: 67 year old male who presents with a past medical history of neurogenic bladder. He is currently been managed with a catheter placement since 2019 and has yearly follow ups with Dr. Annabell Howells. Over the past 3 years, home health has been changing his catheter. Per his report, they have been having increased difficulty with insertion of the catheter and the catheter has had some increased bloody drainage. He has also noticed a purple tinge to his catheter bag and reports an increased amount of sediment within the catheter bag. He denies flank pain, fevers, chills, nausea, vomiting and any suprapubic pain or pressure.    03/19/21: Mr. Dahle returns today in f/u. He has a foley for bladder management that has been changed by home health monthly. He has had the catheter for about 3 years. He has lost 10 lb with [employer]. He has had no difficulty with the foley. He had cystoscopy with dilation in 2/22 and the bladder was noted to be unremarkable. He had a spinal injury from an epidural abscess and is gradually regaining some strength but he isn't walking yet. He has had no hematuria or flank pain. he does have a history of stones and had a 1.3cm RLP stone on [place] in 12/21 at [facility] and the stone was 0.9 on our [place] in 1/21.   05/01/2021: CT imaging ordered after last office visit showed a 9 mm nonobstructing calculi within the right ureter. Also numerous nonobstructing calculi noted within the right kidney itself. KUB obtained after CT imaging also confirmed the presence of the right proximal ureteral calculi. He is scheduled to undergo cystoscopy with right-sided ureteroscopy on 08/02 with his urologist. He continues with an indwelling Foley catheter. He does well with this endorsing good urine output without bothersome or painful urgency. Denies leaking around Catheter tubing, interval recurrence of gross hematuria. He has had no right-sided pain or discomfort  suggestive of obstructive uropathy.   Patient denies any changes to past medical history, prescription medications taken on a daily basis. Denies recent treatment for UTI. No interval fevers or chills, nausea/vomiting.   05/29/2021: Pt returns for tethered stent removal after undergoing URS with Dr Annabell Howells earlier this month. Per his urologist instruction, his wife removed an intact stent about a week after the procedure. There were no complications with this. He has had no recurrence of pain/discomfort suggestive of obstructive uropathy. His Foley catheter continues to drain well without gross hematuria. He denies painful or leaking urgency. He has had no interval fevers or chills, nausea/vomiting.   10/22/21: Orenthial returns today in f/u. He had ureteroscopy last summer for a right ureteral stone. He had a f/u renal US in 9/22 that was unremarkable and no residual stones were noted. He has had no hematuria of other issues with the catheter. He has had no issues with penile erosion. He has had the foley for about 4 years.   04/01/22: Mancel returns today for cystosocpy. He has been doing better with less foley obstruction since his last visit with the acetic acid irritation. He has had no hematuria or flank pain.   05/26/2023: Pt with above noted hx. Seen in consult by urology on 7/17 after hospital admission for bowel obstruction with CT imaging incidentally noting an obstructing 9 x 15 mm right proximal ureteral calculi. Patient also had similar size opacities in the lower poles of each kidney as well as a 7 mm bladder stone. He did have some mild aki  at time of admission but was not having specific right-sided pain or discomfort suggestive of obstructive uropathy. He did not have ureteral stenting. He underwent decompression colonoscopy on 7/18. His kidney function improved at time of hospital discharge on 7/21 with creatinine of 0.9. He continues with a chronic indwelling Foley exchanged monthly by home  health agency. His last catheter exchange occurred during his hospitalization. He had positive urine cultures with 2 separate organisms (Proteus and Klebsiella). This was thought to be related to chronic colonization so was not discharged home on oral antibiotics. Back today for f/u exam accompanied by his wife. Continues to tolerate Foley catheter well endorsing good urine output without gross hematuria or painful/leaking urgency. He does admit to having some intermittent right sided pain and discomfort and request some pain medication to use if needed. He is having normal daily bowel movements at this time. He denies fevers or chills, nausea/vomiting.     ALLERGIES: Unasyn SOLR    MEDICATIONS: Acetic Acid 0.25 % solution, irrigation Instill 30ml 2-3x weekly and leave indwelling for 30 minutes  Oxycodone Hcl 5 mg tablet Oral     GU PSH: Complex cystometrogram, w/ void pressure and urethral pressure profile studies, any technique - 2021, 2020 Complex Uroflow - 2021, 2020 Cysto Dilate Stricture (M or F) - 2022 Cysto Uretero Lithotripsy - 2015 Cystoscopy - 04/01/2022 Cystoscopy Insert Stent - 2015, 2015 Emg surf Electrd - 2021, 2020 Inject For cystogram - 2021, 2020 Insert Bladder Cath; Complex - 2022 Intrabd voidng Press - 2021, 2020 Non-Newborn Circumcision - 2010 Ureteroscopic laser litho - 2022       PSH Notes: Cystoscopy With Ureteroscopy With Lithotripsy, Cystoscopy With Insertion Of Ureteral Stent Left, Back Surgery, Cystoscopy With Insertion Of Ureteral Stent Left, Circumcision No Clamp/Device/Dorsal Slit Older Than 28 Days   NON-GU PSH: Back surgery Cervical Laminoplasty - 2020     GU PMH: Areflexic bladder, He is doing better with the foley on the acetic acid irrigations and will just need f/u in a year with a renal US and cystoscopy. - 04/01/2022, He is doing well with the foley and will return in 6 months for cystoscopy to monitor the bladder mucosa. , - 2023, - 05/29/2021, -  2022, He is doing well with the foley and had no more difficulty with exchanges. , - 2022, - 2022, - 2022, - 2021 (Stable), - 2020 Chronic cystitis (w/o hematuria), His bladder is inflammed with cloudy urine but he has not had any recent symptomatic infections. - 04/01/2022 Disorder of Penis Ot - 04/01/2022, Penile edema has resolved., - 2021, He has penile edema, - 2021 History of urolithiasis (Stable) - 04/01/2022, He had no residual hydro or stones on the [place] in September and no worrisome symptoms. , - 2023, No stones seen on renal US. , - 2021 Renal and ureteral calculus - 05/29/2021 Ureteral calculus (Stable) - 2022, Calculus of left ureter, - 2015 Bulbar urethral stricture - 2022, - 2022, - 2022 Renal calculus (Stable), He has had interval growth of the RLP stone and needs a CT for further assessment as he would be a difficult patient to do a PCNL on with his size. I would want to treat the stone with uretersocopy or ESWL if possible. - 2022, Nephrolithiasis, - 2015 Urinary Retention, He is tolerating the foley well but is not making much progress with rehab and remains morbidly obese. He will continue monthly foley changes and I will have him return in 6 months for cystoscopy. - 2021, -  2021, He is tolerating the foley but had some bleeding requiring an ER visit in October. The urine is clear now. , - 2021, - 2020 (Stable), He has an areflexic bladder with good capacity and no instability but some loss of compliance. I discussed the options for management including continued catheter drainage or CIC which I recommend. I am going to have him return for CIC instruction and he will need a renal US and consideration of repeat urodynamics in about 6 months. , - 2020, He has post op urinary retention that appears to be secondary to SCI related to a spinal abscess. He will continue with foley drainage for now. He may be getting cervical surgery by Dr. Jordan Likes. He will need urodynamics when he is done with his  surgeries. He will f/u in 6 weeks and I gave him an order for [facility] to change his foley monthly. He will need a Hoyar lift for UDS. , - 2019 Unihibited neuropathic bladder - 2021, - 2021 Incomplete bladder emptying, He has transitioned from the spinal shock phase to having detrusor instability with pressures that don't suggest EDSD but EMG tracing was not obtained. He did void but left a significant PVR. His DLPP was acceptable. He is gaining leg strength and I discussed options including another attempt at CIC with consideration of an anticholinergic or myrbetriq if he has leakage between caths, a suprapubic tube or continued foley drainage. He will continue with the foley catheter to given him more time to rehab and will return in 6 months for a voiding trial if he is able to stand and ambulate sufficiently. - 2021 Personal Hx Urinary Tract Infections - 2020 Urinary Retention, Unspec - 2019 Urinary Tract Inf, Unspec site, Pyuria - 2015 Hydronephrosis Unspec, Hydronephrosis - 2015 ED due to arterial insufficiency, Erectile dysfunction due to arterial insufficiency - 2014 Phimosis, Phimosis - 2014      PMH Notes:  2009-01-31 08:26:51 - Note: Arthritis   NON-GU PMH: Encounter for attention to other artificial openings of urinary tract, He will continue the acetic acid irrigations. - 04/01/2022, - 2023 Obesity, I discussed the importance of weight loss and the need to avoid sweets and starches. I recommended he seek out a nutritionist as well. With his weight where it is, he is not a candidate for a suprapubic tube or CIC. - 2021 Infection and inflammatory reaction due to other urinary catheter, subsequent encounter, He had some bleeding with a recent catheter change but that has resolved. I will get a culture from the new cath today. - 2021 Pressure ulcer of unspecified buttock, unspecified stage, His aide has reported a buttocks ulcer and I will get him set up for a wound center consultation. -  2021 Encounter for general adult medical examination without abnormal findings, Encounter for preventive health examination - 2015 Personal history of other diseases of the digestive system, History of gastroesophageal reflux (GERD) - 2015 Personal history of other diseases of the circulatory system, History of hypertension - 2014 Personal history of other diseases of the nervous system and sense organs, History of sleep apnea - 2014 Personal history of other endocrine, nutritional and metabolic disease, History of hypercholesterolemia - 2014 Personal history of other mental and behavioral disorders, History of depression - 2014 Arthritis GERD    FAMILY HISTORY: Death of family member - Runs In Family Diabetes - Mother   SOCIAL HISTORY: Marital Status: Married Preferred Language: English; Race: Black or African American Current Smoking Status: Patient has never smoked.  Tobacco Use Assessment Completed: Used Tobacco in last 30 days? Drinks 3 caffeinated drinks per day.     Notes: Alcohol use, Never a smoker, Married, Caffeine use, Unemployed, Number of children   REVIEW OF SYSTEMS:    GU Review Male:   Patient denies frequent urination, hard to postpone urination, burning/ pain with urination, get up at night to urinate, leakage of urine, stream starts and stops, trouble starting your stream, have to strain to urinate , erection problems, and penile pain.  Gastrointestinal (Upper):   Patient denies nausea, vomiting, and indigestion/ heartburn.  Gastrointestinal (Lower):   Patient denies diarrhea and constipation.  Constitutional:   Patient denies fever, night sweats, weight loss, and fatigue.  Skin:   Patient denies skin rash/ lesion and itching.  Eyes:   Patient denies blurred vision and double vision.  Ears/ Nose/ Throat:   Patient denies sore throat and sinus problems.  Hematologic/Lymphatic:   Patient denies easy bruising and swollen glands.  Cardiovascular:   Patient denies leg  swelling and chest pains.  Respiratory:   Patient denies cough and shortness of breath.  Endocrine:   Patient denies excessive thirst.  Musculoskeletal:   Patient denies back pain and joint pain.  Neurological:   Patient denies headaches and dizziness.  Psychologic:   Patient denies depression and anxiety.   VITAL SIGNS:      05/26/2023 08:08 AM  Weight 356 lb / 161.48 kg  Height 70 in / 177.8 cm  BP 185/115 mmHg  Pulse 76 /min  Temperature 97.1 F / 36.1 C  BMI 51.1 kg/m   GU PHYSICAL EXAMINATION:    Urethral Meatus: Distal shaft hypospadias . Normal size. No lesion, no wart, no polyp, no balanitis, no discharge.   Penis: Penile foley catheter present draining grossly clear-yellow urine.   MULTI-SYSTEM PHYSICAL EXAMINATION:    Constitutional: Obese. Moderate physical deformities. Normally developed. Good grooming. Pt in motorized wheelchair.   Neck: Neck symmetrical, not swollen. Normal tracheal position.  Respiratory: No labored breathing, no use of accessory muscles.   Cardiovascular: Normal temperature, normal extremity pulses, no swelling, no varicosities.  Skin: No paleness, no jaundice, no cyanosis. No lesion, no ulcer, no rash.  Neurologic / Psychiatric: Oriented to time, oriented to place, oriented to person. No depression, no anxiety, no agitation.  Gastrointestinal: Morbidly obese abdomen. No mass, no tenderness, no rigidity.   Musculoskeletal: Wheelchair-bound with limited mobility.     Complexity of Data:  Source Of History:  Patient, Family/Caregiver, Medical Record Summary  Lab Test Review:   BMP, CBC with Diff, CMP  Records Review:   Previous Doctor Records, Previous Hospital Records, Previous Patient Records  Urine Test Review:   Urine Culture  X-Ray Review: C.T. Abdomen/Pelvis: Reviewed Films. Reviewed Report.    Notes:                     CLINICAL DATA: Ascites, abdominal distension, dyspnea   EXAM:  CT ABDOMEN AND PELVIS WITH CONTRAST   TECHNIQUE:   Multidetector CT imaging of the abdomen and pelvis was performed  using the standard protocol following bolus administration of  intravenous contrast.   RADIATION DOSE REDUCTION: This exam was performed according to the  departmental dose-optimization program which includes automated  exposure control, adjustment of the mA and/or kV according to  patient size and/or use of iterative reconstruction technique.   CONTRAST: OMNIPAQUE IOHEXOL 300 MG/ML SOLN   COMPARISON: 03/27/2021   FINDINGS:  Lower chest: No acute abnormality.  Mild cardiomegaly. Small hiatal  hernia.   Hepatobiliary: No focal liver abnormality is seen. Status post  cholecystectomy. No biliary dilatation.   Pancreas: Unremarkable   Spleen: Unremarkable   Adrenals/Urinary Tract: The adrenal glands are unremarkable. Kidneys  are normal in position. Mild bilateral cortical atrophy. Previously  noted elongate right ureteral calculus has migrated slightly  distally now at the level of the right transverse process of L5,  measuring 7 x 9 x 15 mm. There has developed moderate right  hydronephrosis. Additionally, there is marked proximal urothelial  thickening and mild peripelvic and periureteric inflammatory  stranding in keeping with urothelial inflammation, as can be seen  with superimposed infection. Additional bilateral nonobstructing  renal calculi are identified measuring up to 14 mm bilaterally. No  hydronephrosis on the left. No ureteral calculi on the left. Mild  bilateral nonspecific perinephric stranding again noted with no  loculated perinephric fluid collection identified. Foley catheter  balloon is seen within a decompressed bladder lumen. 7 mm bladder  calculus now identified within the decompressed bladder lumen.   Stomach/Bowel: There is marked gaseous distension of the colon  without evidence of obstruction most suggestive of a colonic ileus.  This appears progressive since prior examination  and the transverse  colon measures up to 16 mm in diameter. Surgical changes of gastric  sleeve resection are identified. The small bowel is unremarkable.  Appendix is unremarkable. No free intraperitoneal gas or fluid.   Vascular/Lymphatic: Aortic atherosclerosis. No enlarged abdominal or  pelvic lymph nodes.   Reproductive: Prostate is unremarkable.   Other: No abdominal wall hernia. Mild nonspecific subcutaneous edema  within the pannus.   Musculoskeletal: Degenerative and postsurgical changes are noted  within the lumbar spine with ankylosis of the vertebral bodies and  posterior elements of L4-S1. Bridging osteophytes are noted within  the visualized thoracolumbar spine from a T7 through L1. No acute  bone abnormality. No lytic or blastic bone lesion.   IMPRESSION:  1. Interval distal migration of the previously noted elongate right  ureteral calculus now at the level of the right transverse process  of L5, measuring 7 x 9 x 15 mm. Interval development of moderate  right hydronephrosis. Additionally, there is marked proximal  urothelial thickening and mild peripelvic and periureteric  inflammatory stranding in keeping with urothelial inflammation, as  can be seen with superimposed infection. Correlation with urinalysis  and urine culture may be helpful for further management.  2. Superimposed moderate bilateral nonobstructing nephrolithiasis.  3. 7 mm bladder calculus now identified within the decompressed  bladder lumen in keeping with a recently passed calculus.  4. Progressive gaseous distension of the colon without evidence of  obstruction most suggestive of a colonic ileus. Transverse colon  measures up to 16 cm in diameter.  5. Mild cardiomegaly. Small hiatal hernia.  6. Aortic atherosclerosis.   Aortic Atherosclerosis (ICD10-I70.0).    Electronically Signed  By: Helyn Numbers M.D.  On: 04/29/2023 21:02     PROCEDURES:          Visit Complexity - G2211     ASSESSMENT:      ICD-10 Details  1 GU:   Ureteral calculus - N20.1 Right, Undiagnosed New Problem - He has a 9x24mm right proximal ureteral calculi with associated obstructive signs. Renal function preserved, he is only minimally symptomatic from this.  2   Ureteral obstruction secondary to calculous - N13.2 Right, Undiagnosed New Problem  3   Renal calculus - N20.0 Bilateral, Chronic, Worsening - His large  bilateral lower pole renal calculi, the calculus on the right somewhat of a partial staghorn appearance.  4   Areflexic bladder - N31.2 Chronic, Stable - Will continue with monthly home health catheter exchanges. I gave him an order to have a culture/sensitivity sent at time of his upcoming catheter exchange in anticipation of stone procedure.   PLAN:            Medications New Meds: Tramadol Hcl 50 mg tablet 1 tablet PO Q 6 H PRN   #10  0 Refill(s)  Pharmacy Name:  [id number]  Address:  [address]   [place], [place] [id number]  Phone:  [phone]  Fax:  [phone]            Orders Labs Urine Culture CATH  Lab Notes: To be done at next catheter exchange by home health agency          Schedule Return Visit/Planned Activity: Next Available Appointment - Schedule Surgery, Follow up MD          Document Letter(s):  Created for Patient: Clinical Summary         Notes:   Pt has an obstructing 15x9 mm right prox. ureteral calculus discovered incidentally during hospitalization for bowel obstruction. There was a small bladder stone as well. He had mild AKI which improved following colonoscopy and fluid resuscitation. Patient minimally symptomatic with some intermittent right sided pain/discomfort. He'll likely need URS for definitive management. He has had this before tolerating well. I'll send a message to Dr Annabell Howells regarding concurrent treatment of the noted bladder calculi as well as planning for continued observation vs definitive tx of his bilateral  renal calculi as well.   For ureteroscopy I described the risks which include heart attack, stroke, pulmonary embolus, death, bleeding, infection, damage to contiguous structures, positioning injury, ureteral stricture, ureteral avulsion, ureteral injury, need for ureteral stent, inability to perform ureteroscopy, need for an interval procedure, inability to clear stone burden, stent discomfort and pain.   I gave an order to have repeat urine culture and sensitivity sent at time of his upcoming home health catheter exchange. Per the patient request I sent in a prescription for some tramadol. He understands the increased risk for constipation the medication can cause. Infrequent/sparing use recommended. Once I have discussed with the patient's urologist, the patient will be contacted and scheduled appropriately.

## 2023-07-07 NOTE — Anesthesia Preprocedure Evaluation (Signed)
Anesthesia Evaluation  Patient identified by MRN, date of birth, ID band Patient awake    Reviewed: Allergy & Precautions, NPO status , Patient's Chart, lab work & pertinent test results  History of Anesthesia Complications Negative for: history of anesthetic complications  Airway Mallampati: IV  TM Distance: >3 FB Neck ROM: Full    Dental no notable dental hx.    Pulmonary sleep apnea , former smoker   Pulmonary exam normal        Cardiovascular hypertension, Pt. on medications Normal cardiovascular exam     Neuro/Psych    GI/Hepatic Neg liver ROS,GERD  Controlled,,  Endo/Other    Morbid obesity  Renal/GU Renal InsufficiencyRenal diseaseRIGHT URETERAL STONE     Musculoskeletal  (+) Arthritis ,    Abdominal   Peds  Hematology  (+) Blood dyscrasia (Hgb 10.5), anemia   Anesthesia Other Findings Day of surgery medications reviewed with patient.  Reproductive/Obstetrics negative OB ROS                              Anesthesia Physical Anesthesia Plan  ASA: 3  Anesthesia Plan: General   Post-op Pain Management: Tylenol PO (pre-op)*   Induction: Intravenous  PONV Risk Score and Plan: 2 and Treatment may vary due to age or medical condition, Ondansetron, Dexamethasone and Midazolam  Airway Management Planned: Oral ETT  Additional Equipment: None  Intra-op Plan:   Post-operative Plan: Extubation in OR  Informed Consent: I have reviewed the patients History and Physical, chart, labs and discussed the procedure including the risks, benefits and alternatives for the proposed anesthesia with the patient or authorized representative who has indicated his/her understanding and acceptance.     Dental advisory given  Plan Discussed with: CRNA  Anesthesia Plan Comments:          Anesthesia Quick Evaluation

## 2023-07-08 ENCOUNTER — Encounter (HOSPITAL_COMMUNITY): Payer: Self-pay | Admitting: Urology

## 2023-07-08 ENCOUNTER — Ambulatory Visit (HOSPITAL_COMMUNITY)
Admission: RE | Admit: 2023-07-08 | Discharge: 2023-07-08 | Disposition: A | Discharge status: Home / Self Care | Payer: Medicare Other | Source: Ambulatory Visit | Attending: Urology | Admitting: Urology

## 2023-07-08 ENCOUNTER — Encounter (HOSPITAL_COMMUNITY)
Admission: RE | Disposition: A | Discharge status: Home / Self Care | Payer: Self-pay | Source: Ambulatory Visit | Attending: Urology

## 2023-07-08 ENCOUNTER — Ambulatory Visit (HOSPITAL_BASED_OUTPATIENT_CLINIC_OR_DEPARTMENT_OTHER): Payer: Medicare Other | Admitting: Anesthesiology

## 2023-07-08 ENCOUNTER — Ambulatory Visit (HOSPITAL_COMMUNITY): Payer: Medicare Other

## 2023-07-08 ENCOUNTER — Ambulatory Visit (HOSPITAL_COMMUNITY): Payer: Medicare Other | Admitting: Anesthesiology

## 2023-07-08 DIAGNOSIS — N132 Hydronephrosis with renal and ureteral calculous obstruction: Secondary | ICD-10-CM | POA: Diagnosis present

## 2023-07-08 DIAGNOSIS — I10 Essential (primary) hypertension: Secondary | ICD-10-CM | POA: Insufficient documentation

## 2023-07-08 DIAGNOSIS — Z6841 Body Mass Index (BMI) 40.0 and over, adult: Secondary | ICD-10-CM | POA: Insufficient documentation

## 2023-07-08 DIAGNOSIS — N319 Neuromuscular dysfunction of bladder, unspecified: Secondary | ICD-10-CM | POA: Diagnosis not present

## 2023-07-08 DIAGNOSIS — N210 Calculus in bladder: Secondary | ICD-10-CM | POA: Diagnosis not present

## 2023-07-08 DIAGNOSIS — K219 Gastro-esophageal reflux disease without esophagitis: Secondary | ICD-10-CM | POA: Diagnosis not present

## 2023-07-08 DIAGNOSIS — Z87891 Personal history of nicotine dependence: Secondary | ICD-10-CM | POA: Diagnosis not present

## 2023-07-08 DIAGNOSIS — Z79899 Other long term (current) drug therapy: Secondary | ICD-10-CM | POA: Diagnosis not present

## 2023-07-08 HISTORY — PX: CYSTOSCOPY/URETEROSCOPY/HOLMIUM LASER/STENT PLACEMENT: SHX6546

## 2023-07-08 SURGERY — CYSTOSCOPY/URETEROSCOPY/HOLMIUM LASER/STENT PLACEMENT
Anesthesia: General | Laterality: Right

## 2023-07-08 MED ORDER — SODIUM CHLORIDE 0.9% FLUSH: 3.0000 mL | Freq: Two times a day (BID) | INTRAVENOUS | Status: DC

## 2023-07-08 MED ORDER — ONDANSETRON HCL 4 MG/2ML IJ SOLN
INTRAMUSCULAR | Status: DC | PRN
  Administered 2023-07-08: 4 mg via INTRAVENOUS

## 2023-07-08 MED ORDER — SODIUM CHLORIDE 0.9 % IR SOLN
Status: DC | PRN
Start: 2023-07-08 — End: 2023-07-08
  Administered 2023-07-08: 6000 mL

## 2023-07-08 MED ORDER — ONDANSETRON HCL 4 MG/2ML IJ SOLN
INTRAMUSCULAR | Status: AC
  Filled 2023-07-08: qty 2

## 2023-07-08 MED ORDER — FENTANYL CITRATE (PF) 100 MCG/2ML IJ SOLN
INTRAMUSCULAR | Status: AC
  Filled 2023-07-08: qty 2

## 2023-07-08 MED ORDER — CIPROFLOXACIN IN D5W 400 MG/200ML IV SOLN
400.0000 mg | INTRAVENOUS | Status: AC
  Administered 2023-07-08: 400 mg via INTRAVENOUS
  Filled 2023-07-08: qty 200

## 2023-07-08 MED ORDER — ACETAMINOPHEN 500 MG PO TABS
1000.0000 mg | ORAL_TABLET | Freq: Once | ORAL | Status: AC
  Administered 2023-07-08: 1000 mg via ORAL
  Filled 2023-07-08: qty 2

## 2023-07-08 MED ORDER — PROPOFOL 10 MG/ML IV BOLUS
INTRAVENOUS | Status: DC | PRN
  Administered 2023-07-08: 200 mg via INTRAVENOUS

## 2023-07-08 MED ORDER — ORAL CARE MOUTH RINSE: 15.0000 mL | Freq: Once | OROMUCOSAL | Status: AC

## 2023-07-08 MED ORDER — LIDOCAINE HCL (CARDIAC) PF 100 MG/5ML IV SOSY
PREFILLED_SYRINGE | INTRAVENOUS | Status: DC | PRN
  Administered 2023-07-08: 100 mg via INTRAVENOUS

## 2023-07-08 MED ORDER — LACTATED RINGERS IV SOLN: INTRAVENOUS | Status: DC

## 2023-07-08 MED ORDER — OXYCODONE HCL 5 MG PO TABS: 5.0000 mg | ORAL_TABLET | Freq: Once | ORAL | Status: DC | PRN

## 2023-07-08 MED ORDER — DEXAMETHASONE SODIUM PHOSPHATE 10 MG/ML IJ SOLN
INTRAMUSCULAR | Status: AC
  Filled 2023-07-08: qty 1

## 2023-07-08 MED ORDER — CIPROFLOXACIN HCL 500 MG PO TABS: 500.0000 mg | ORAL_TABLET | Freq: Two times a day (BID) | ORAL | 0 refills | Status: AC

## 2023-07-08 MED ORDER — SUGAMMADEX SODIUM 200 MG/2ML IV SOLN
INTRAVENOUS | Status: DC | PRN
Start: 2023-07-08 — End: 2023-07-08
  Administered 2023-07-08: 200 mg via INTRAVENOUS

## 2023-07-08 MED ORDER — MIDAZOLAM HCL 2 MG/2ML IJ SOLN
INTRAMUSCULAR | Status: AC
  Filled 2023-07-08: qty 2

## 2023-07-08 MED ORDER — SODIUM CHLORIDE 0.9 % IR SOLN
Status: DC | PRN
  Administered 2023-07-08: 1000 mL

## 2023-07-08 MED ORDER — FENTANYL CITRATE PF 50 MCG/ML IJ SOSY: 25.0000 ug | PREFILLED_SYRINGE | INTRAMUSCULAR | Status: DC | PRN

## 2023-07-08 MED ORDER — DROPERIDOL 2.5 MG/ML IJ SOLN: 0.6250 mg | Freq: Once | INTRAMUSCULAR | Status: DC | PRN

## 2023-07-08 MED ORDER — CHLORHEXIDINE GLUCONATE 0.12 % MT SOLN
15.0000 mL | Freq: Once | OROMUCOSAL | Status: AC
  Administered 2023-07-08: 15 mL via OROMUCOSAL

## 2023-07-08 MED ORDER — FENTANYL CITRATE (PF) 100 MCG/2ML IJ SOLN
INTRAMUSCULAR | Status: DC | PRN
  Administered 2023-07-08: 100 ug via INTRAVENOUS

## 2023-07-08 MED ORDER — TRAMADOL HCL 50 MG PO TABS: 50.0000 mg | ORAL_TABLET | Freq: Four times a day (QID) | ORAL | 0 refills | Status: DC | PRN

## 2023-07-08 MED ORDER — IOHEXOL 300 MG/ML  SOLN
INTRAMUSCULAR | Status: DC | PRN
  Administered 2023-07-08: 2 mL

## 2023-07-08 MED ORDER — ROCURONIUM BROMIDE 100 MG/10ML IV SOLN
INTRAVENOUS | Status: DC | PRN
  Administered 2023-07-08: 70 mg via INTRAVENOUS

## 2023-07-08 MED ORDER — STERILE WATER FOR IRRIGATION IR SOLN
Status: DC | PRN
Start: 2023-07-08 — End: 2023-07-08
  Administered 2023-07-08: 500 mL

## 2023-07-08 MED ORDER — ROCURONIUM BROMIDE 10 MG/ML (PF) SYRINGE
PREFILLED_SYRINGE | INTRAVENOUS | Status: AC
  Filled 2023-07-08: qty 10

## 2023-07-08 MED ORDER — LIDOCAINE HCL (PF) 2 % IJ SOLN
INTRAMUSCULAR | Status: AC
  Filled 2023-07-08: qty 5

## 2023-07-08 MED ORDER — OXYCODONE HCL 5 MG/5ML PO SOLN: 5.0000 mg | Freq: Once | ORAL | Status: DC | PRN

## 2023-07-08 MED ORDER — LIDOCAINE 2% (20 MG/ML) 5 ML SYRINGE
INTRAMUSCULAR | Status: DC | PRN
Start: 2023-07-08 — End: 2023-07-08

## 2023-07-08 MED ORDER — PROPOFOL 10 MG/ML IV BOLUS
INTRAVENOUS | Status: AC
  Filled 2023-07-08: qty 20

## 2023-07-08 MED ORDER — DEXAMETHASONE SODIUM PHOSPHATE 10 MG/ML IJ SOLN
INTRAMUSCULAR | Status: DC | PRN
Start: 2023-07-08 — End: 2023-07-08
  Administered 2023-07-08: 10 mg via INTRAVENOUS

## 2023-07-08 MED ORDER — MIDAZOLAM HCL 5 MG/5ML IJ SOLN
INTRAMUSCULAR | Status: DC | PRN
  Administered 2023-07-08 (×2): 1 mg via INTRAVENOUS

## 2023-07-08 SURGICAL SUPPLY — 28 items
BAG DRN RND TRDRP ANRFLXCHMBR (UROLOGICAL SUPPLIES) ×1
BAG URINE DRAIN 2000ML AR STRL (UROLOGICAL SUPPLIES) IMPLANT
BAG URO CATCHER STRL LF (MISCELLANEOUS) ×1 IMPLANT
BASKET STONE NCOMPASS (UROLOGICAL SUPPLIES) IMPLANT
CATH FOLEY 2WAY SLVR 30CC 20FR (CATHETERS) IMPLANT
CATH URETERAL DUAL LUMEN 10F (MISCELLANEOUS) IMPLANT
CATH URETL OPEN 5X70 (CATHETERS) IMPLANT
CLOTH BEACON ORANGE TIMEOUT ST (SAFETY) ×1 IMPLANT
EXTRACTOR STONE NITINOL NGAGE (UROLOGICAL SUPPLIES) IMPLANT
GLOVE SURG SS PI 8.0 STRL IVOR (GLOVE) ×1 IMPLANT
GOWN STRL REUS W/ TWL XL LVL3 (GOWN DISPOSABLE) ×1 IMPLANT
GOWN STRL REUS W/TWL XL LVL3 (GOWN DISPOSABLE) ×1
GUIDEWIRE ANG ZIPWIRE 038X150 (WIRE) IMPLANT
GUIDEWIRE STR DUAL SENSOR (WIRE) ×1 IMPLANT
IV NS IRRIG 3000ML ARTHROMATIC (IV SOLUTION) ×1 IMPLANT
KIT TURNOVER KIT A (KITS) IMPLANT
LASER FIB FLEXIVA PULSE ID 365 (Laser) IMPLANT
LASER FIB FLEXIVA PULSE ID 550 (Laser) IMPLANT
LASER FIB FLEXIVA PULSE ID 910 (Laser) IMPLANT
MANIFOLD NEPTUNE II (INSTRUMENTS) ×1 IMPLANT
PACK CYSTO (CUSTOM PROCEDURE TRAY) ×1 IMPLANT
SHEATH NAVIGATOR HD 11/13X36 (SHEATH) IMPLANT
STENT URET 6FRX26 CONTOUR (STENTS) IMPLANT
SYR 30ML LL (SYRINGE) IMPLANT
TRACTIP FLEXIVA PULS ID 200XHI (Laser) IMPLANT
TRACTIP FLEXIVA PULSE ID 200 (Laser)
TUBING CONNECTING 10 (TUBING) ×1 IMPLANT
TUBING UROLOGY SET (TUBING) ×1 IMPLANT

## 2023-07-08 NOTE — Op Note (Signed)
Procedure: 1.  Cystoscopy with removal of 7 mm bladder stone. 2.  Cystoscopy with right retrograde pyelogram and interpretation. 3.  Cystoscopy with insertion of right double-J stent. 4.  Application of fluoroscopy.  Preop diagnosis: 1.  7 mm bladder stone. 2.  15 mm right ureteral stone with obstruction. 3.  Right renal stones.  Postop diagnosis: Same.  Surgeon: Dr. Bjorn Pippin.  Anesthesia: General.  Specimen: None.  Drain: 6 [place] by 26 cm right contour double-J stent and 20 French Foley catheter.  EBL: None.  Complications: None.  Indications: The patient is a 67 year old male who was found on recent imaging for severe ileus to have a 15 mm right mid proximal ureteral stone with obstruction along with bilateral renal stones and a 7 mm bladder stone.  It was felt that ureteroscopy was indicated.  Procedure: He was taken the operating room where he was given Cipro.  A general anesthetic was induced.  He was placed in lithotomy position and fitted with PAS hose.  His Foley catheter was removed.  He was prepped with Betadine solution and draped in usual sterile fashion.  He had severe urethral erosion from the chronic Foley to the penoscrotal junction.  The 52 French scope was inserted with a 30 degree lens.  Examination revealed an otherwise normal bulbar urethra.  The external sphincter was intact.  The prostatic urethra had some evidence of partial resection or either catheter balloon necrosis with bilobar hyperplasia proximally.  There was a stone noted in the prostatic urethra which was removed with grasping forceps.  Inspection of the bladder demonstrated squamous metaplasia of the bladder neck.  There was some erythema of the mucosa consistent chronic catheter drainage.  No papillary tumors were identified.  Ureteral orifices were initially difficult to identify for eventually found posterior laterally and were otherwise unremarkable.  A 5 French open-ended catheter was then  used to perform a right retrograde pyelogram with Omnipaque.  He was noted to have severe J hooking of the distal ureter with a filling defect approximately 8 cm proximal to the meatus with opacity consistent with his stone.  Contrast did not flow more proximally because of the obstruction.  In attempt to pass a sensor wire through the open-ended catheter was not possible due to the degree of J hooking however was eventually able to get an angled tip Glidewire into the distal ureter over which I was able to advance the 5 [place] open-ended catheter.  Initially the wire did not want to pass the stone as it was quite impacted but eventually as I advanced the open-ended catheter to the level of the stone I was able to get to the wire into the renal collecting system.  The open-ended catheter was then advanced over the wire to the collecting system and the Glidewire was switched out for a sensor wire.  Because of his difficult anatomy and the tightly impacted stone it was felt that placement of a stent with return to the later date for ureteroscopy was most prudent approach particularly since some of the urine that effluxed around the wire was turbid and concerning for infection.  A 6 French by 26 cm contour double-J stent was then advanced over the wire under fluoroscopic and visual guidance.  Once the proximal end was in the kidney the wire was removed leaving a good coil in the kidney and a partial coil in the bladder.  The distal end was repositioned in the bladder to ensure more complete loop.  The  scope was removed and a 20 French 30 cc Foley was placed without difficulty.  The balloon was filled with 30 cc of sterile fluid and then the catheter placed to straight drainage.  He was taken down from lithotomy position, his anesthetic was reversed and he was moved to recovery in stable condition.  There were no complications.

## 2023-07-08 NOTE — Anesthesia Procedure Notes (Addendum)
Procedure Name: Intubation Date/Time: 07/08/2023 7:42 AM  Performed by: Maurene Capes, CRNAPre-anesthesia Checklist: Patient identified, Emergency Drugs available, Suction available and Patient being monitored Patient Re-evaluated:Patient Re-evaluated prior to induction Oxygen Delivery Method: Circle System Utilized Preoxygenation: Pre-oxygenation with 100% oxygen Induction Type: IV induction Ventilation: Mask ventilation without difficulty Laryngoscope Size: Mac and 4 Grade View: Grade III Tube type: Oral Tube size: 8.0 mm Number of attempts: 1 Airway Equipment and Method: Oral airway and Rigid stylet Placement Confirmation: ETT inserted through vocal cords under direct vision, positive ETCO2 and breath sounds checked- equal and bilateral Secured at: 23 cm Tube secured with: Tape Dental Injury: Teeth and Oropharynx as per pre-operative assessment

## 2023-07-08 NOTE — Transfer of Care (Signed)
Immediate Anesthesia Transfer of Care Note  Patient: [name] [name]  Procedure(s) Performed: CYSTOSCOPY, RIGHT RETROGRADE PYELOGRAM, RIGHT URETERAL STENT PLACEMENT (Right)  Patient Location: PACU  Anesthesia Type:General  Level of Consciousness: awake, alert , oriented, and patient cooperative  Airway & Oxygen Therapy: Patient Spontanous Breathing and Patient connected to face mask oxygen  Post-op Assessment: Report given to RN and Post -op Vital signs reviewed and stable  Post vital signs: Reviewed and stable  Last Vitals:  Vitals Value Taken Time  BP    Temp    Pulse    Resp    SpO2      Last Pain:  Vitals:   07/08/23 0616  TempSrc:   PainSc: 0-No pain      Patients Stated Pain Goal: 4 (07/08/23 0616)  Complications: No notable events documented.

## 2023-07-08 NOTE — Interval H&P Note (Signed)
History and Physical Interval Note: He has been having some pain.  07/08/2023 7:18 AM  [name] [name]  has presented today for surgery, with the diagnosis of RIGHT URETERAL STONE.  The various methods of treatment have been discussed with the patient and family. After consideration of risks, benefits and other options for treatment, the patient has consented to  Procedure(s) with comments: CYSTOSCOPY RIGHT RETROGRADE PYELOGRAM RIGHT URETEROSCOPY/HOLMIUM LASER/STENT PLACEMENT (Right) - 60 MINS FOR CASE as a surgical intervention.  The patient's history has been reviewed, patient examined, no change in status, stable for surgery.  I have reviewed the patient's chart and labs.  Questions were answered to the patient's satisfaction.     Bjorn Pippin

## 2023-07-08 NOTE — Addendum Note (Signed)
Addendum  created 07/08/23 1002 by Maurene Capes, CRNA   Clinical Note Signed, Intraprocedure Blocks edited, SmartForm saved

## 2023-07-08 NOTE — Anesthesia Postprocedure Evaluation (Signed)
Anesthesia Post Note  Patient: [name] [name]  Procedure(s) Performed: CYSTOSCOPY, RIGHT RETROGRADE PYELOGRAM, RIGHT URETERAL STENT PLACEMENT (Right)     Patient location during evaluation: PACU Anesthesia Type: General Level of consciousness: awake and alert Pain management: pain level controlled Vital Signs Assessment: post-procedure vital signs reviewed and stable Respiratory status: spontaneous breathing, nonlabored ventilation and respiratory function stable Cardiovascular status: blood pressure returned to baseline Postop Assessment: no apparent nausea or vomiting Anesthetic complications: no   No notable events documented.  Last Vitals:  Vitals:   07/08/23 0915 07/08/23 0920  BP: (!) 141/97 (!) 141/91  Pulse: (!) 51 (!) 52  Resp: 15 16  Temp:  (!) 36.3 C  SpO2: 93% 93%    Last Pain:  Vitals:   07/08/23 0920  TempSrc:   PainSc: 0-No pain                 [name]

## 2023-07-09 ENCOUNTER — Other Ambulatory Visit: Payer: Self-pay | Admitting: Urology

## 2023-07-09 ENCOUNTER — Encounter (HOSPITAL_COMMUNITY): Payer: Self-pay | Admitting: Urology

## 2023-07-27 NOTE — Patient Instructions (Signed)
SURGICAL WAITING ROOM VISITATION Patients having surgery or a procedure may have no more than 2 support people in the waiting area - these visitors may rotate in the visitor waiting room.   Due to an increase in RSV and influenza rates and associated hospitalizations, children ages 1 and under may not visit patients in [facility] hospitals. If the patient needs to stay at the hospital during part of their recovery, the visitor guidelines for inpatient rooms apply.  PRE-OP VISITATION  Pre-op nurse will coordinate an appropriate time for 1 support person to accompany the patient in pre-op.  This support person may not rotate.  This visitor will be contacted when the time is appropriate for the visitor to come back in the pre-op area.  Please refer to the [facility] website for the visitor guidelines for Inpatients (after your surgery is over and you are in a regular room).  You are not required to quarantine at this time prior to your surgery. However, you must do this: Hand Hygiene often Do NOT share personal items Notify your provider if you are in close contact with someone who has COVID or you develop fever 100.4 or greater, new onset of sneezing, cough, sore throat, shortness of breath or body aches.  If you test positive for Covid or have been in contact with anyone that has tested positive in the last 10 days please notify you surgeon.    Your procedure is scheduled on:  Tuesday  August 12, 2023  Report to [facility] Main Entrance: [place] entrance where the [employer] is available.   Report to admitting at: 06:45    AM  Call this number if you have any questions or problems the morning of surgery [phone]  DO NOT EAT OR DRINK ANYTHING AFTER MIDNIGHT THE NIGHT PRIOR TO YOUR SURGERY / PROCEDURE.   FOLLOW  ANY ADDITIONAL PRE OP INSTRUCTIONS YOU RECEIVED FROM YOUR SURGEON'S OFFICE!!!   Oral Hygiene is also important to reduce your risk of infection.         Remember - BRUSH YOUR TEETH THE MORNING OF SURGERY WITH YOUR REGULAR TOOTHPASTE  Do NOT smoke after Midnight the night before surgery.  STOP TAKING all Vitamins, Herbs and supplements 1 week before your surgery.   Take ONLY these medicines the morning of surgery with A SIP OF WATER: Tamsulosin (Flomax), Amlodipine ?PM,  You may take either Tylenol or Tramadol if needed for pain.                      You may not have any metal on your body including  jewelry, and body piercing  Do not wear  lotions, powders, cologne, or deodorant  Men may shave face and neck.  Contacts, Hearing Aids, dentures or bridgework may not be worn into surgery. DENTURES WILL BE REMOVED PRIOR TO SURGERY PLEASE DO NOT APPLY "Poly grip" OR ADHESIVES!!!   Patients discharged on the day of surgery will not be allowed to drive home.  Someone NEEDS to stay with you for the first 24 hours after anesthesia.  Do not bring your home medications to the hospital. The Pharmacy will dispense medications listed on your medication list to you during your admission in the Hospital.  Please read over the following fact sheets you were given: IF YOU HAVE QUESTIONS ABOUT YOUR PRE-OP INSTRUCTIONS, PLEASE CALL [phone].   [facility] - Preparing for Surgery Before surgery, you can play an important role.  Because skin  is not sterile, your skin needs to be as free of germs as possible.  You can reduce the number of germs on your skin by washing with CHG (chlorahexidine gluconate) soap before surgery.  CHG is an antiseptic cleaner which kills germs and bonds with the skin to continue killing germs even after washing. Please DO NOT use if you have an allergy to CHG or antibacterial soaps.  If your skin becomes reddened/irritated stop using the CHG and inform your nurse when you arrive at Short Stay. Do not shave (including legs and underarms) for at least 48 hours prior to the first CHG shower.  You may shave your face/neck.  Please  follow these instructions carefully:  1.  Shower with CHG Soap the night before surgery and the  morning of surgery.  2.  If you choose to wash your hair, wash your hair first as usual with your normal  shampoo.  3.  After you shampoo, rinse your hair and body thoroughly to remove the shampoo.                             4.  Use CHG as you would any other liquid soap.  You can apply chg directly to the skin and wash.  Gently with a scrungie or clean washcloth.  5.  Apply the CHG Soap to your body ONLY FROM THE NECK DOWN.   Do not use on face/ open                           Wound or open sores. Avoid contact with eyes, ears mouth and genitals (private parts).                       Wash face,  Genitals (private parts) with your normal soap.             6.  Wash thoroughly, paying special attention to the area where your  surgery  will be performed.  7.  Thoroughly rinse your body with warm water from the neck down.  8.  DO NOT shower/wash with your normal soap after using and rinsing off the CHG Soap.            9.  Pat yourself dry with a clean towel.            10.  Wear clean pajamas.            11.  Place clean sheets on your bed the night of your first shower and do not  sleep with pets.  ON THE DAY OF SURGERY : Do not apply any lotions/deodorants the morning of surgery.  Please wear clean clothes to the hospital/surgery center.    FAILURE TO FOLLOW THESE INSTRUCTIONS MAY RESULT IN THE CANCELLATION OF YOUR SURGERY  PATIENT SIGNATURE_________________________________  NURSE SIGNATURE__________________________________  ________________________________________________________________________

## 2023-07-27 NOTE — Progress Notes (Signed)
COVID Vaccine received:  []  No [x]  Yes Date of any COVID positive Test in last 90 days: August 2024  PCP - Renford Dills, MD Cardiologist -   Chest x-ray - 04-29-2023   1v  Epic EKG - 04-29-2023   Epic  Stress Test -  ECHO - TEE 07-01-2018  Epic Cardiac Cath -   PCR screen: []  Ordered & Completed []   No Order but Needs PROFEND     [x]   N/A for this surgery  Surgery Plan:  [x]  Ambulatory   []  Outpatient in bed  []  Admit Anesthesia:    [x]  General  []  Spinal  []   Choice []   MAC  Bowel Prep - [x]  No  []   Yes ______  Pacemaker / ICD device [x]  No []  Yes   Spinal Cord Stimulator:[x]  No []  Yes       History of Sleep Apnea? []  No [x]  Yes   CPAP used?- [x]  No []  Yes    Does the patient monitor blood sugar?   [x]  N/A   []  No []  Yes  Patient has: [x]  NO Hx DM   []  Pre-DM   []  DM1  []   DM2  Blood Thinner / Instructions:  none Aspirin Instructions:  none  ERAS Protocol Ordered: [x]  No  []  Yes Patient is to be NPO after: midnight prior  Dental hx: []  Dentures:  []  N/A      []  Bridge or Partial:                   []  Loose or Damaged teeth:   Comments:   Activity level: Patient is able / unable to climb a flight of stairs without difficulty; []  No CP  []  No SOB, but would have ___   Patient can / can not perform ADLs without assistance.   Anesthesia review: HTN, ACDF (C3-4,4-5,5-6) by Dr. Jordan Likes in 2020. S/p Gastric sleeve 2014, CPS-neurogenic bladder- chronic indwelling Foley since 2019, OSA- No CPAP, Hx MSSA,GERD,   anemia,   Patient denies shortness of breath, fever, cough and chest pain at PAT appointment.  Patient verbalized understanding and agreement to the Pre-Surgical Instructions that were given to them at this PAT appointment. Patient was also educated of the need to review these PAT instructions again prior to his surgery.I reviewed the appropriate phone numbers to call if they have any and questions or concerns.

## 2023-07-29 ENCOUNTER — Other Ambulatory Visit: Payer: Self-pay | Admitting: Urology

## 2023-07-29 ENCOUNTER — Encounter (HOSPITAL_COMMUNITY): Payer: Self-pay

## 2023-07-29 ENCOUNTER — Other Ambulatory Visit: Payer: Self-pay

## 2023-07-29 ENCOUNTER — Encounter (HOSPITAL_COMMUNITY)
Admission: RE | Admit: 2023-07-29 | Discharge: 2023-07-29 | Disposition: A | Discharge status: Home / Self Care | Payer: Medicare Other | Source: Ambulatory Visit | Attending: Urology | Admitting: Urology

## 2023-07-29 VITALS — BP 180/98 | HR 60 | Temp 98.6°F | Resp 18 | Ht 70.0 in | Wt 356.0 lb

## 2023-07-29 DIAGNOSIS — Z01812 Encounter for preprocedural laboratory examination: Secondary | ICD-10-CM | POA: Diagnosis present

## 2023-07-29 DIAGNOSIS — Z789 Other specified health status: Secondary | ICD-10-CM | POA: Insufficient documentation

## 2023-07-29 DIAGNOSIS — Z01818 Encounter for other preprocedural examination: Secondary | ICD-10-CM

## 2023-07-29 DIAGNOSIS — G894 Chronic pain syndrome: Secondary | ICD-10-CM | POA: Insufficient documentation

## 2023-07-29 DIAGNOSIS — I10 Essential (primary) hypertension: Secondary | ICD-10-CM | POA: Insufficient documentation

## 2023-07-29 LAB — COMPREHENSIVE METABOLIC PANEL
ALT: 12 U/L (ref 0–44)
AST: 17 U/L (ref 15–41)
Albumin: 3.8 g/dL (ref 3.5–5.0)
Alkaline Phosphatase: 110 U/L (ref 38–126)
Anion gap: 6 (ref 5–15)
BUN: 20 mg/dL (ref 8–23)
CO2: 25 mmol/L (ref 22–32)
Calcium: 8.5 mg/dL — ABNORMAL LOW (ref 8.9–10.3)
Chloride: 103 mmol/L (ref 98–111)
Creatinine, Ser: 0.68 mg/dL (ref 0.61–1.24)
GFR, Estimated: >60 mL/min (ref >60)
Glucose, Bld: 88 mg/dL (ref 70–99)
Potassium: 4.2 mmol/L (ref 3.5–5.1)
Sodium: 134 mmol/L — ABNORMAL LOW (ref 135–145)
Total Bilirubin: 0.9 mg/dL (ref 0.3–1.2)
Total Protein: 8.1 g/dL (ref 6.5–8.1)

## 2023-07-29 LAB — CBC
HCT: 36.7 % — ABNORMAL LOW (ref 39.0–52.0)
Hemoglobin: 11.2 g/dL — ABNORMAL LOW (ref 13.0–17.0)
MCH: 25.5 pg — ABNORMAL LOW (ref 26.0–34.0)
MCHC: 30.5 g/dL (ref 30.0–36.0)
MCV: 83.4 fL (ref 80.0–100.0)
Platelets: 234 10*3/uL (ref 150–400)
RBC: 4.4 MIL/uL (ref 4.22–5.81)
RDW: 16.9 % — ABNORMAL HIGH (ref 11.5–15.5)
WBC: 4.9 10*3/uL (ref 4.0–10.5)
nRBC: 0 % (ref 0.0–0.2)

## 2023-07-30 ENCOUNTER — Other Ambulatory Visit: Payer: Self-pay | Admitting: Urology

## 2023-08-11 NOTE — H&P (Signed)
My PSA is elevated above the normal range.  HPI: [name] [name] is a 67 year-old male established patient who is here for an elevated PSA.    07/23/23: [name] [name] returns today in f/u. He was started on finasteride in July for recurrent hematuria with BPH and BOO. He continues to have intermittent gross hematuria that can be worse with activity. He has rare RBC's in the UA today. He has 2 left renal stones that didn't seem to be in a position to cause bleeding. He has some thin clots. His PSA is down to 3.79 on the finasteride. His IPSS is 6 with nocturia x 3.   04/25/23: [name] [name] returns today in f/u for cystoscopy to complete his hematuria w/u. The CT showed a LLP stone but it doesn't appear to be in a position to cause bleeding, He had a prostate middle lobe but the visualization of the base of the bladder was obscured by beam hardening. UA has RBC's today and he has continued to see intermittent hematuria after physical activity.   03/24/23: [name] [name] returns today in f/u. His PSA is down to 5.78. He has stable LUTS with an IPSS of 8. He remains on tamsulosin bid. He continues to have some hematuria every 2-3 days. He has stones and is on Eliquis. He passes clots after he mows his grass. He has had no flank pain. He had ureteroscopy in 2021. He had a KUB last year that showed small bilateral lower pole stones. His testosterone level is minimally changed at .   11/20/22: [name] [name] returns today in f/u for his elevated PSA. His PSA was down to 5.17 in 11/23 but is back up to 6.72 on 11/13/21. His testosterone level was only 173 in 11/23. He has mild/mod LUTS with an IPSS of 9. He has nocturia x 2. His UA is clear today. He passed a small stone a couple of weeks ago with mild hematuria at that time.   [name] [name] is sent back by his PCP for an elevated PSA of 6.8 that was done for the evaluation of some testicular atrophy. His prior PSA was 3.16 in 2021. He has a history of stones but has no flank pain or gross hematuria.  He has chronic microhematuria with 3-10 RBC's today. He has mild LUTS with an IPSS of 7 and nocturia x 2 with some urgency. He has not had COVID recently. He was on bactrim from his dermatologist. He is on tamsulosin bid for BPH with BOO. He is on Eliquis. He had ureteroscopy in 2021. He has a history of low T and was on TRT but not in several year.     AUA Symptom Score: Less than 20% of the time he has the sensation of not emptying his bladder completely when finished urinating. Less than 20% of the time he has to urinate again fewer than two hours after he has finished urinating. Less than 20% of the time he has to start and stop again several times when he urinates. Less than 20% of the time he finds it difficult to postpone urination. Less than 50% of the time he has a weak urinary stream. Less than 20% of the time he has to push or strain to begin urination. He has to get up to urinate 1 time from the time he goes to bed until the time he gets up in the morning.   Calculated AUA Symptom Score: 8    ALLERGIES: No Known Drug Allergies    MEDICATIONS: Finasteride 5  mg tablet 1 tablet PO Daily  Levothyroxine Sodium  Metoprolol Tartrate  Tamsulosin Hcl 0.4 mg capsule 1 capsule PO Daily  Eliquis  Ibuprofen  Oxycodone Hcl  Tylenol     GU PSH: Cysto Remove Stent FB Sim - 2021 Cystoscopy - 04/25/2023 ESWL Locm 300-399Mg /Ml Iodine,1Ml - 04/11/2023 Ureteroscopic laser litho, Bilateral - 2021       PSH Notes: Lithotripsy     NON-GU PSH: Aortic valve replacement (tissue) - about 2011 Heart Surgery (Unspecified) Lumbar Laminectomy Visit Complexity (formerly GPC1X) - 03/24/2023, 11/20/2022     GU PMH: BPH w/LUTS - 04/25/2023, He is doing well on tamsulosin. , - 03/24/2023, I have refilled the tamsulosin for once daily use. , - 11/20/2022, He has mild LUTS and will stay on tamsulosin., - 08/20/2022, He is voiding better on tamsulosin bid and will continue that. He will return in a year. , -  Jun 09, 2022, He is going to change back to tamsulosin. , - 2022, - 2022, He has nocturia on the tamsulosin. I am going to have him try silodosin to see if that does a better job with the nocturia. , - 2021 Elevated PSA, PSA at f/u. - 04/25/2023, His PSA is back down some. I don't know that he needs further testing but will review that again at f/u. , - 03/24/2023, His PSA remains elevated but stable over the last 3 months. I discussed options with him and will just have him return in 4 months with another PSA. I did explain that I am concerned when I see a PSA elevation in the face of low testosterone and if there is any further increase in the PSA, I will recommend further evaluation with probable biopsy. , - 11/20/2022, His PSA is up to 6.8 from 3.16 2 years ago but his exam is benign. I am going to repeat a PSA today with reflex to free/total and if that is still up I will consider further evaluation possibly with an ExoDx and MRIP. If it is down, I will repeat in 3 months. I will also check a testosterone panel based on his history because if his T is quite low that makes a high grade prostate cancer more likely to be the cause of the PSA increase. , - 08/20/2022 Gross hematuria, He continues to have some bleeding that I think is probably from the prostate. There is a small chance it could be from the left renal stone and I will monitor that. I am going to start him on finasteride but did discuss TURP. I revewed the side effects and instructions for the med. He will f/u in 3 months with a PSA. - 04/25/2023, - 04/11/2023, He will return with the CT and I will consider a Cysto based on the results. , - 03/24/2023 Renal calculus - 04/25/2023, He has increased hematuria and bilateral renal stones. I will get a CT hematuria study to assess the stones and r/o other causes for the hematuria. , - 03/24/2023, He has stable bilateral renal stones. KUB in a year., - 06/09/22, He has passed several stones but the stones are stable  on KUB. I will do a metabolic w/u and if he continues to pass stones, I will need to do a CT. , - 2022, Overdue for f/u in regards to this. KUB could not be performed at this time so pt will need to rescheduled for f/u in the next 4-6 weeks for KUB and repeat MD exam., - 2022, - 2021 Weak Urinary Stream -  04/25/2023 Nocturia - 03/24/2023, - 11/20/2022, - 08/20/2022, - 06/03/2022, - 2022, - 2022, - 2021 Microscopic hematuria, He has stable hematuria. - 08/20/2022, He had cystoscopy last year and the hematuria is likely from the stones. , - 2022, - 2021 Urinary Retention - 04/18/2022 Buried penis (acquired) - 2022 Ureteral calculus - 2021, He has significant stent pain associated with his recent procedure. His stents were removed and his urine was cultured. he was given Cipro to cover the procedure and to continue 250mg  bid for 2 days. , - 2021, He has bilateral ureteral stones with a 13mm right proximal and 6mm left mid stone. I discussed ESWL and URS and will get him set up for bilateral URS today. I have reviewed the risks of ureteroscopy including bleeding, infection, ureteral injury, need for a stent or secondary procedures, thrombotic events and anesthetic complications. , - 2021    NON-GU PMH: Low testosterone, His T is stable but low at 169. - 03/24/2023, I will repeat the testosterone panel at f/u. , - 11/20/2022 GERD Skin Cancer, History    FAMILY HISTORY: No Family History    SOCIAL HISTORY: Marital Status: Married Preferred Language: English; Race: White Current Smoking Status: Patient has never smoked.   Tobacco Use Assessment Completed: Used Tobacco in last 30 days? Drinks 1 caffeinated drink per day.    REVIEW OF SYSTEMS:    GU Review Male:   Patient reports get up at night to urinate. Patient denies frequent urination, hard to postpone urination, burning/ pain with urination, leakage of urine, stream starts and stops, trouble starting your stream, have to strain to urinate , erection  problems, and penile pain.  Gastrointestinal (Upper):   Patient denies nausea, vomiting, and indigestion/ heartburn.  Gastrointestinal (Lower):   Patient denies diarrhea and constipation.  Constitutional:   Patient denies fever, night sweats, weight loss, and fatigue.  Skin:   Patient denies skin rash/ lesion and itching.  Eyes:   Patient denies blurred vision and double vision.  Ears/ Nose/ Throat:   Patient denies sore throat and sinus problems.  Hematologic/Lymphatic:   Patient denies swollen glands and easy bruising.  Cardiovascular:   Patient denies leg swelling and chest pains.  Respiratory:   Patient denies cough and shortness of breath.  Endocrine:   Patient denies excessive thirst.  Musculoskeletal:   Patient denies back pain and joint pain.  Neurological:   Patient denies headaches and dizziness.  Psychologic:   Patient denies depression and anxiety.   Notes: Weak stream    VITAL SIGNS: None   MULTI-SYSTEM PHYSICAL EXAMINATION:    Constitutional: Well-nourished. No physical deformities. Normally developed. Good grooming.   Respiratory: Normal breath sounds. No labored breathing, no use of accessory muscles.   Cardiovascular: Regular rate and rhythm. No murmur, no gallop.      Complexity of Data:  Lab Test Review:   PSA  Records Review:   AUA Symptom Score, Previous Patient Records  Urine Test Review:   Urinalysis   03/17/23 11/13/22 08/20/22 08/07/22 11/16/19  PSA  Total PSA 5.78 ng/mL 6.72 ng/mL 5.17 ng/mL 6.8 ng/ml 3.16 ng/ml  Free PSA 1.10 ng/mL 1.19 ng/mL 1.03 ng/mL    % Free PSA 19 % PSA 18 % PSA 20 % PSA      PROCEDURES:          Visit Complexity - G2211 Chronic management         Urinalysis w/Scope Dipstick Dipstick Cont'd Micro  Color: Yellow Bilirubin: Neg mg/dL WBC/hpf: NS (  Not Seen)  Appearance: Clear Ketones: Neg mg/dL RBC/hpf: 0 - 2/hpf  Specific Gravity: 1.020 Blood: Trace ery/uL Bacteria: NS (Not Seen)  pH: <=5.0 Protein: Neg mg/dL Cystals: NS  (Not Seen)  Glucose: Neg mg/dL Urobilinogen: 0.2 mg/dL Casts: NS (Not Seen)    Nitrites: Neg Trichomonas: Not Present    Leukocyte Esterase: Neg leu/uL Mucous: Present      Epithelial Cells: 0 - 5/hpf      Yeast: NS (Not Seen)      Sperm: Not Present    Notes: transitional epithelial cells noted          Urinalysis w/Scope Dipstick Dipstick Cont'd Micro  Color: Yellow Bilirubin: Neg mg/dL WBC/hpf: 0 - 5/hpf  Appearance: Clear Ketones: Neg mg/dL RBC/hpf: 0 - 2/hpf  Specific Gravity: 1.025 Blood: 1+ ery/uL Bacteria: NS (Not Seen)  pH: <=5.0 Protein: Neg mg/dL Cystals: NS (Not Seen)  Glucose: Neg mg/dL Urobilinogen: 0.2 mg/dL Casts: NS (Not Seen)    Nitrites: Neg Trichomonas: Not Present    Leukocyte Esterase: Trace leu/uL Mucous: Not Present      Epithelial Cells: 0 - 5/hpf      Yeast: NS (Not Seen)      Sperm: Not Present    ASSESSMENT:      ICD-10 Details  1 GU:   Elevated PSA - R97.20 Chronic, Stable, Improving - down appropriately on finasteride to 3.79.   2   BPH w/LUTS - N40.1 Chronic, Stable - He has mild LUTS and will continue current therapy.   3   Nocturia - R35.1 Chronic, Stable  4   Gross hematuria - R31.0 Chronic, Stable - He has some thin clots with the hematuria whch can be from the stones. I will get him set up for left ureteroscopy and will get him cleared to come off of Eliquis.   I have reviewed the risks of ureteroscopy including bleeding, infection, ureteral injury, need for a stent or secondary procedures, thrombotic events and anesthetic complications.    5   Renal calculus - N20.0 Chronic, Stable   PLAN:           Schedule Return Visit/Planned Activity: Next Available Appointment - Schedule Surgery  Procedure: Unspecified Date - Cysto Uretero Lithotripsy - [phone], left Notes: Next available.           Document Letter(s):  Created for Patient: Clinical Summary

## 2023-08-12 ENCOUNTER — Encounter (HOSPITAL_COMMUNITY): Payer: Self-pay | Admitting: Urology

## 2023-08-12 ENCOUNTER — Ambulatory Visit (HOSPITAL_COMMUNITY): Payer: Medicare Other | Admitting: Medical

## 2023-08-12 ENCOUNTER — Other Ambulatory Visit: Payer: Self-pay

## 2023-08-12 ENCOUNTER — Ambulatory Visit (HOSPITAL_COMMUNITY)
Admission: RE | Admit: 2023-08-12 | Discharge: 2023-08-12 | Disposition: A | Discharge status: Home / Self Care | Payer: Medicare Other | Source: Ambulatory Visit | Attending: Urology | Admitting: Urology

## 2023-08-12 ENCOUNTER — Encounter (HOSPITAL_COMMUNITY)
Admission: RE | Disposition: A | Discharge status: Home / Self Care | Payer: Self-pay | Source: Ambulatory Visit | Attending: Urology

## 2023-08-12 ENCOUNTER — Ambulatory Visit (HOSPITAL_BASED_OUTPATIENT_CLINIC_OR_DEPARTMENT_OTHER): Payer: Medicare Other | Admitting: Anesthesiology

## 2023-08-12 ENCOUNTER — Ambulatory Visit (HOSPITAL_COMMUNITY): Payer: Medicare Other

## 2023-08-12 DIAGNOSIS — N401 Enlarged prostate with lower urinary tract symptoms: Secondary | ICD-10-CM | POA: Diagnosis not present

## 2023-08-12 DIAGNOSIS — N500 Atrophy of testis: Secondary | ICD-10-CM | POA: Insufficient documentation

## 2023-08-12 DIAGNOSIS — R972 Elevated prostate specific antigen [PSA]: Secondary | ICD-10-CM | POA: Diagnosis not present

## 2023-08-12 DIAGNOSIS — N201 Calculus of ureter: Secondary | ICD-10-CM | POA: Diagnosis present

## 2023-08-12 DIAGNOSIS — D649 Anemia, unspecified: Secondary | ICD-10-CM | POA: Insufficient documentation

## 2023-08-12 DIAGNOSIS — Z6841 Body Mass Index (BMI) 40.0 and over, adult: Secondary | ICD-10-CM | POA: Insufficient documentation

## 2023-08-12 DIAGNOSIS — M1990 Unspecified osteoarthritis, unspecified site: Secondary | ICD-10-CM | POA: Diagnosis not present

## 2023-08-12 DIAGNOSIS — Z7901 Long term (current) use of anticoagulants: Secondary | ICD-10-CM | POA: Diagnosis not present

## 2023-08-12 DIAGNOSIS — N320 Bladder-neck obstruction: Secondary | ICD-10-CM | POA: Insufficient documentation

## 2023-08-12 DIAGNOSIS — N189 Chronic kidney disease, unspecified: Secondary | ICD-10-CM

## 2023-08-12 DIAGNOSIS — R310 Gross hematuria: Secondary | ICD-10-CM | POA: Diagnosis not present

## 2023-08-12 DIAGNOSIS — G4730 Sleep apnea, unspecified: Secondary | ICD-10-CM | POA: Diagnosis not present

## 2023-08-12 DIAGNOSIS — I129 Hypertensive chronic kidney disease with stage 1 through stage 4 chronic kidney disease, or unspecified chronic kidney disease: Secondary | ICD-10-CM

## 2023-08-12 DIAGNOSIS — N319 Neuromuscular dysfunction of bladder, unspecified: Secondary | ICD-10-CM | POA: Diagnosis not present

## 2023-08-12 DIAGNOSIS — R351 Nocturia: Secondary | ICD-10-CM | POA: Insufficient documentation

## 2023-08-12 DIAGNOSIS — Z87891 Personal history of nicotine dependence: Secondary | ICD-10-CM | POA: Insufficient documentation

## 2023-08-12 DIAGNOSIS — I10 Essential (primary) hypertension: Secondary | ICD-10-CM | POA: Diagnosis not present

## 2023-08-12 HISTORY — PX: CYSTOSCOPY/URETEROSCOPY/HOLMIUM LASER/STENT PLACEMENT: SHX6546

## 2023-08-12 SURGERY — CYSTOSCOPY/URETEROSCOPY/HOLMIUM LASER/STENT PLACEMENT
Anesthesia: General | Site: Ureter | Laterality: Right

## 2023-08-12 MED ORDER — PROPOFOL 10 MG/ML IV BOLUS
INTRAVENOUS | Status: AC
  Filled 2023-08-12: qty 20

## 2023-08-12 MED ORDER — LACTATED RINGERS IV SOLN: INTRAVENOUS | Status: DC

## 2023-08-12 MED ORDER — OXYCODONE HCL 5 MG PO TABS: 5.0000 mg | ORAL_TABLET | Freq: Once | ORAL | Status: DC | PRN

## 2023-08-12 MED ORDER — IOHEXOL 300 MG/ML  SOLN
INTRAMUSCULAR | Status: DC | PRN
  Administered 2023-08-12: 10 mL via URETHRAL

## 2023-08-12 MED ORDER — FENTANYL CITRATE PF 50 MCG/ML IJ SOSY
25.0000 ug | PREFILLED_SYRINGE | INTRAMUSCULAR | Status: DC | PRN
Start: 2023-08-12 — End: 2023-08-12

## 2023-08-12 MED ORDER — ORAL CARE MOUTH RINSE: 15.0000 mL | Freq: Once | OROMUCOSAL | Status: AC

## 2023-08-12 MED ORDER — SODIUM CHLORIDE 0.9 % IR SOLN
Status: DC | PRN
  Administered 2023-08-12: 3000 mL

## 2023-08-12 MED ORDER — DEXAMETHASONE SODIUM PHOSPHATE 4 MG/ML IJ SOLN
INTRAMUSCULAR | Status: DC | PRN
  Administered 2023-08-12: 5 mg via INTRAVENOUS

## 2023-08-12 MED ORDER — CIPROFLOXACIN HCL 500 MG PO TABS: 500.0000 mg | ORAL_TABLET | Freq: Two times a day (BID) | ORAL | 0 refills | Status: DC

## 2023-08-12 MED ORDER — CIPROFLOXACIN IN D5W 400 MG/200ML IV SOLN
400.0000 mg | INTRAVENOUS | Status: AC
  Administered 2023-08-12: 400 mg via INTRAVENOUS
  Filled 2023-08-12: qty 200

## 2023-08-12 MED ORDER — CHLORHEXIDINE GLUCONATE 0.12 % MT SOLN
15.0000 mL | Freq: Once | OROMUCOSAL | Status: AC
  Administered 2023-08-12: 15 mL via OROMUCOSAL

## 2023-08-12 MED ORDER — OXYCODONE HCL 5 MG/5ML PO SOLN: 5.0000 mg | Freq: Once | ORAL | Status: DC | PRN

## 2023-08-12 MED ORDER — VANCOMYCIN HCL 1500 MG/300ML IV SOLN
1500.0000 mg | Freq: Once | INTRAVENOUS | Status: AC
  Administered 2023-08-12: 1500 mg via INTRAVENOUS
  Filled 2023-08-12: qty 300

## 2023-08-12 MED ORDER — MIDAZOLAM HCL 2 MG/2ML IJ SOLN
INTRAMUSCULAR | Status: AC
  Filled 2023-08-12: qty 2

## 2023-08-12 MED ORDER — MIDAZOLAM HCL 5 MG/5ML IJ SOLN
INTRAMUSCULAR | Status: DC | PRN
  Administered 2023-08-12: 2 mg via INTRAVENOUS

## 2023-08-12 MED ORDER — LIDOCAINE 2% (20 MG/ML) 5 ML SYRINGE
INTRAMUSCULAR | Status: DC | PRN
  Administered 2023-08-12: 100 mg via INTRAVENOUS

## 2023-08-12 MED ORDER — PHENYLEPHRINE HCL (PRESSORS) 10 MG/ML IV SOLN
INTRAVENOUS | Status: DC | PRN
  Administered 2023-08-12: 80 ug via INTRAVENOUS

## 2023-08-12 MED ORDER — PROPOFOL 10 MG/ML IV BOLUS
INTRAVENOUS | Status: DC | PRN
  Administered 2023-08-12: 200 mg via INTRAVENOUS

## 2023-08-12 MED ORDER — FENTANYL CITRATE (PF) 100 MCG/2ML IJ SOLN
INTRAMUSCULAR | Status: DC | PRN
  Administered 2023-08-12 (×2): 50 ug via INTRAVENOUS

## 2023-08-12 MED ORDER — SODIUM CHLORIDE 0.9% FLUSH: 3.0000 mL | Freq: Two times a day (BID) | INTRAVENOUS | Status: DC

## 2023-08-12 MED ORDER — ONDANSETRON HCL 4 MG/2ML IJ SOLN
INTRAMUSCULAR | Status: DC | PRN
  Administered 2023-08-12: 4 mg via INTRAVENOUS

## 2023-08-12 MED ORDER — EPHEDRINE SULFATE-NACL 50-0.9 MG/10ML-% IV SOSY
PREFILLED_SYRINGE | INTRAVENOUS | Status: DC | PRN
  Administered 2023-08-12 (×2): 5 mg via INTRAVENOUS

## 2023-08-12 MED ORDER — DROPERIDOL 2.5 MG/ML IJ SOLN: 0.6250 mg | Freq: Once | INTRAMUSCULAR | Status: DC | PRN

## 2023-08-12 MED ORDER — TRAMADOL HCL 50 MG PO TABS: 50.0000 mg | ORAL_TABLET | Freq: Four times a day (QID) | ORAL | 0 refills | Status: DC | PRN

## 2023-08-12 MED ORDER — FENTANYL CITRATE (PF) 100 MCG/2ML IJ SOLN
INTRAMUSCULAR | Status: AC
  Filled 2023-08-12: qty 2

## 2023-08-12 SURGICAL SUPPLY — 28 items
BAG DRN RND TRDRP ANRFLXCHMBR (UROLOGICAL SUPPLIES) ×1
BAG URINE DRAIN 2000ML AR STRL (UROLOGICAL SUPPLIES) IMPLANT
BAG URO CATCHER STRL LF (MISCELLANEOUS) ×1 IMPLANT
BASKET STONE NCOMPASS (UROLOGICAL SUPPLIES) IMPLANT
CATH FOLEY 2WAY SLVR 30CC 20FR (CATHETERS) IMPLANT
CATH URETERAL DUAL LUMEN 10F (MISCELLANEOUS) IMPLANT
CATH URETL OPEN 5X70 (CATHETERS) IMPLANT
CLOTH BEACON ORANGE TIMEOUT ST (SAFETY) ×1 IMPLANT
EXTRACTOR STONE NITINOL NGAGE (UROLOGICAL SUPPLIES) IMPLANT
GLOVE SURG SS PI 8.0 STRL IVOR (GLOVE) ×1 IMPLANT
GOWN STRL REUS W/ TWL XL LVL3 (GOWN DISPOSABLE) ×1 IMPLANT
GOWN STRL REUS W/TWL XL LVL3 (GOWN DISPOSABLE) ×1
GUIDEWIRE STR DUAL SENSOR (WIRE) ×1 IMPLANT
HOLDER FOLEY CATH W/STRAP (MISCELLANEOUS) IMPLANT
IV NS IRRIG 3000ML ARTHROMATIC (IV SOLUTION) ×1 IMPLANT
KIT TURNOVER KIT A (KITS) IMPLANT
LASER FIB FLEXIVA PULSE ID 365 (Laser) IMPLANT
LASER FIB FLEXIVA PULSE ID 550 (Laser) IMPLANT
LASER FIB FLEXIVA PULSE ID 910 (Laser) IMPLANT
MANIFOLD NEPTUNE II (INSTRUMENTS) ×1 IMPLANT
PACK CYSTO (CUSTOM PROCEDURE TRAY) ×1 IMPLANT
PAD PREP 24X48 CUFFED NSTRL (MISCELLANEOUS) ×1 IMPLANT
SHEATH NAVIGATOR HD 11/13X36 (SHEATH) IMPLANT
STENT URET 6FRX24 CONTOUR (STENTS) IMPLANT
TRACTIP FLEXIVA PULS ID 200XHI (Laser) IMPLANT
TRACTIP FLEXIVA PULSE ID 200 (Laser) ×2
TUBING CONNECTING 10 (TUBING) ×1 IMPLANT
TUBING UROLOGY SET (TUBING) ×1 IMPLANT

## 2023-08-12 NOTE — Transfer of Care (Signed)
Immediate Anesthesia Transfer of Care Note  Patient: [name] [name]  Procedure(s) Performed: CYSTOSCOPY RIGHT   URETEROSCOPY/HOLMIUM LASER/STENT EXCHANGE (Right: Ureter)  Patient Location: PACU  Anesthesia Type:General  Level of Consciousness: awake, alert , and oriented  Airway & Oxygen Therapy: Patient Spontanous Breathing and Patient connected to nasal cannula oxygen  Post-op Assessment: Report given to RN and Post -op Vital signs reviewed and stable  Post vital signs: Reviewed and stable  Last Vitals:  Vitals Value Taken Time  BP 136/88 08/12/23 1050  Temp    Pulse 59 08/12/23 1051  Resp 14 08/12/23 1051  SpO2 96 % 08/12/23 1051  Vitals shown include unfiled device data.  Last Pain:  Vitals:   08/12/23 0747  TempSrc: Oral  PainSc:          Complications: No notable events documented.

## 2023-08-12 NOTE — Anesthesia Postprocedure Evaluation (Signed)
Anesthesia Post Note  Patient: [name] [name]  Procedure(s) Performed: CYSTOSCOPY RIGHT   URETEROSCOPY/HOLMIUM LASER/STENT EXCHANGE (Right: Ureter)     Patient location during evaluation: PACU Anesthesia Type: General Level of consciousness: sedated and patient cooperative Pain management: pain level controlled Vital Signs Assessment: post-procedure vital signs reviewed and stable Respiratory status: spontaneous breathing Cardiovascular status: stable Anesthetic complications: no   No notable events documented.  Last Vitals:  Vitals:   08/12/23 1128 08/12/23 1130  BP: (!) 155/106 (!) 163/92  Pulse: (!) 52   Resp: 15   Temp: 36.4 C   SpO2: 99%     Last Pain:  Vitals:   08/12/23 1147  TempSrc:   PainSc: 0-No pain                 Lewie Loron

## 2023-08-12 NOTE — Interval H&P Note (Signed)
History and Physical Interval Note:  He has no new complaints.   08/12/2023 9:00 AM  [name] [name]  has presented today for surgery, with the diagnosis of RIGHT URETERAL AND RENAL STONES.  The various methods of treatment have been discussed with the patient and family. After consideration of risks, benefits and other options for treatment, the patient has consented to  Procedure(s): CYSTOSCOPY RIGHT   URETEROSCOPY/HOLMIUM LASER/STENT EXCHANGE (Right) as a surgical intervention.  The patient's history has been reviewed, patient examined, no change in status, stable for surgery.  I have reviewed the patient's chart and labs.  Questions were answered to the patient's satisfaction.     Bjorn Pippin

## 2023-08-12 NOTE — Anesthesia Procedure Notes (Signed)
Procedure Name: LMA Insertion Date/Time: 08/12/2023 9:31 AM  Performed by: Nathen May, CRNAPre-anesthesia Checklist: Patient identified, Emergency Drugs available, Suction available and Patient being monitored Patient Re-evaluated:Patient Re-evaluated prior to induction Oxygen Delivery Method: Circle System Utilized Preoxygenation: Pre-oxygenation with 100% oxygen Induction Type: IV induction Ventilation: Mask ventilation without difficulty LMA: LMA inserted LMA Size: 4.0 Number of attempts: 1 Airway Equipment and Method: Bite block Placement Confirmation: positive ETCO2 Tube secured with: Tape Dental Injury: Teeth and Oropharynx as per pre-operative assessment

## 2023-08-12 NOTE — Anesthesia Preprocedure Evaluation (Addendum)
Anesthesia Evaluation  Patient identified by MRN, date of birth, ID band Patient awake    Reviewed: Allergy & Precautions, NPO status , Patient's Chart, lab work & pertinent test results  History of Anesthesia Complications Negative for: history of anesthetic complications  Airway Mallampati: III  TM Distance: >3 FB Neck ROM: Limited    Dental  (+) Dental Advisory Given   Pulmonary sleep apnea , former smoker   Pulmonary exam normal breath sounds clear to auscultation       Cardiovascular hypertension, Pt. on medications  Rhythm:Regular Rate:Normal + Systolic murmurs TEE 2019 - Left ventricle: The cavity size was normal. Wall thickness was increased in a pattern of mild LVH. Systolic function was normal. The estimated ejection fraction was in the range of 60% to 65%. Wall motion was normal; there were no regional wall motion abnormalities.  - Aortic valve: No evidence of vegetation. There was no stenosis.  - Aorta: Normal caliber thoracic aorta with mild plaque.  - Mitral valve: No evidence of vegetation. There was no significant regurgitation.  - Left atrium: No evidence of thrombus in the atrial cavity or appendage.  - Right ventricle: The cavity size was normal. Systolic function was normal.  - Right atrium: No evidence of thrombus in the atrial cavity or appendage.  - Atrial septum: No ASD or PFO by color doppler.  - Tricuspid valve: No evidence of vegetation.  - Pulmonic valve: No evidence of vegetation.   Impressions:   - No evidence of endocarditis.     Neuro/Psych    GI/Hepatic Neg liver ROS,GERD  Controlled,,  Endo/Other    Morbid obesity  Renal/GU Renal InsufficiencyRenal diseaseRIGHT URETERAL STONE     Musculoskeletal  (+) Arthritis ,    Abdominal  (+) + obese  Peds  Hematology  (+) Blood dyscrasia (Hgb 10.5), anemia   Anesthesia Other Findings Day of surgery medications reviewed with patient.   Reproductive/Obstetrics                             Anesthesia Physical Anesthesia Plan  ASA: 4  Anesthesia Plan: General   Post-op Pain Management: Tylenol PO (pre-op)*   Induction: Intravenous  PONV Risk Score and Plan: 2 and Treatment may vary due to age or medical condition, Ondansetron, Dexamethasone and Midazolam  Airway Management Planned: LMA  Additional Equipment: None  Intra-op Plan:   Post-operative Plan: Extubation in OR  Informed Consent: I have reviewed the patients History and Physical, chart, labs and discussed the procedure including the risks, benefits and alternatives for the proposed anesthesia with the patient or authorized representative who has indicated his/her understanding and acceptance.     Dental advisory given  Plan Discussed with: CRNA  Anesthesia Plan Comments:         Anesthesia Quick Evaluation

## 2023-08-12 NOTE — Op Note (Signed)
Procedure: 1.  Cystoscopy with right ureteroscopy with holmium laser application, stone extraction and exchange of double-J stent. 2.  Application of fluoroscopy.  Pre-Op diagnosis: 15 mm right proximal ureteral stone.  Postop diagnosis: Same.  Surgeon: Dr. Bjorn Pippin.  Anesthesia: General.  Specimen: Stone fragments.  Drain: 6 [place] by 24 cm right contour double-J stent with tether and 20 French Foley catheter.  EBL: None.  Complications: None.  Indications: The patient is a 67 year old male with a neurogenic bladder and chronic Foley who has a 15 mm obstructing right proximal ureteral stone for which he underwent stenting approximately a month ago.  He returns now for stone management.  He was taken operating room was given Cipro and vancomycin.  A general anesthetic was induced.  He was placed in lithotomy position.  His Foley catheter was removed.  His legs were too large for PAS hose.  His genitalia was prepped with Betadine solution he was draped in the usual sterile fashion  The 21 French cystoscope with 30 degree lens was then passed.  The anterior urethra had eroded back to the penoscrotal junction from a chronic Foley, the external sphincter was patulous.  There was pressure necrosis in the prostate from this place Foley balloons in the past.  The bladder neck was slightly high.  The bladder wall was smooth with chronic erythema in the right ureteral orifice with the stent was well up on the right lateral wall.  Left ureteral orifice was not clearly visualized.  No tumors were noted.  The stent was grasped with a rigid grasping forceps after failure to grasp it with a flexible scope because of the required angulation.  Once the stent was pulled to the urethral meatus, a sensor wire was advanced to the kidney under fluoroscopic guidance.  An 8 French dual-lumen catheter was then passed over the wire into the mid to proximal ureter and contrast was instilled through the second  port.  The retrograde study demonstrated the filling defect above the tip of the double lumen consistent with the stone with subsequent flow into the collecting system.  I only instilled sufficient contrast to ensure that I was in the proper channel.  The dual-lumen catheter was then advanced to the kidney and a second wire was then passed.  The dual-lumen catheter was then removed and the inner core of 36 cm digital access sheath was passed over the working wire and the safety wire was secured.  The access sheath inner core passed easily.  The assembled 11/13 [place] sheath was then advanced over the wire to just below the level of the stone which could be visualized fluoroscopically and the inner core and wire were removed.  The dual-lumen digital flexible scope was then passed and the stone was visualized but I did have to backed the sheath up a bit to fully visualize the stone.  The stone was then fragmented using the 262 m holmium laser fiber with the dusting setting on the [facility] laser.  The stone was primarily fragmented with 0.3 J and 60 Hz.  The fragments were then removed using an engage basket.  Final inspection revealed only sand in the ureter after fragmentation and stone removal was then performed.  The scope was then advanced into the kidney.  He had not some known stone seen on CT scan but they were not readily visualized so I was unable to treat them.  The digital access sheath was then removed under fluoroscopic guidance to ensure the safety wire  remained in the collecting system.  The cystoscope was then reinserted over the wire and a fresh 6 [place] by 24 cm contour double-J stent with tether was passed to the kidney under fluoroscopic guidance.  The wire was removed, leaving good coil in the kidney and a good coil in the bladder.  The bladder was then partially drained and the cystoscope was removed leaving the stent string exiting urethra.  A fresh 20 French Foley catheter was inserted  and the balloon was filled with 30 cc of sterile fluid.  The catheter was placed to straight drainage and then the stent string was secured to the catheter with pink tape.  He was taken down from lithotomy position, his anesthetic was reversed and he was moved to recovery in stable condition.  There were no complications.

## 2023-08-12 NOTE — Discharge Instructions (Addendum)
We will remove the stent when you return on 11/12.  Please be careful with the string that is taped to the catheter.

## 2023-08-13 ENCOUNTER — Encounter (HOSPITAL_COMMUNITY): Payer: Self-pay | Admitting: Urology

## 2023-08-17 ENCOUNTER — Emergency Department (HOSPITAL_COMMUNITY): Payer: Medicare Other

## 2023-08-17 ENCOUNTER — Encounter (HOSPITAL_COMMUNITY): Payer: Self-pay | Admitting: Internal Medicine

## 2023-08-17 ENCOUNTER — Inpatient Hospital Stay (HOSPITAL_COMMUNITY)
Admission: EM | Admit: 2023-08-17 | Discharge: 2023-08-20 | DRG: 394 | Disposition: A | Discharge status: Home / Self Care | Payer: Medicare Other | Attending: Internal Medicine | Admitting: Internal Medicine

## 2023-08-17 ENCOUNTER — Inpatient Hospital Stay (HOSPITAL_COMMUNITY): Payer: Medicare Other

## 2023-08-17 ENCOUNTER — Encounter (HOSPITAL_COMMUNITY)
Admission: EM | Disposition: A | Discharge status: Home / Self Care | Payer: Self-pay | Source: Home / Self Care | Attending: Internal Medicine

## 2023-08-17 ENCOUNTER — Other Ambulatory Visit: Payer: Self-pay

## 2023-08-17 DIAGNOSIS — R935 Abnormal findings on diagnostic imaging of other abdominal regions, including retroperitoneum: Secondary | ICD-10-CM | POA: Diagnosis not present

## 2023-08-17 DIAGNOSIS — Z881 Allergy status to other antibiotic agents status: Secondary | ICD-10-CM | POA: Diagnosis not present

## 2023-08-17 DIAGNOSIS — R109 Unspecified abdominal pain: Secondary | ICD-10-CM

## 2023-08-17 DIAGNOSIS — Y838 Other surgical procedures as the cause of abnormal reaction of the patient, or of later complication, without mention of misadventure at the time of the procedure: Secondary | ICD-10-CM | POA: Diagnosis present

## 2023-08-17 DIAGNOSIS — R140 Abdominal distension (gaseous): Principal | ICD-10-CM

## 2023-08-17 DIAGNOSIS — Z9049 Acquired absence of other specified parts of digestive tract: Secondary | ICD-10-CM | POA: Diagnosis not present

## 2023-08-17 DIAGNOSIS — D631 Anemia in chronic kidney disease: Secondary | ICD-10-CM | POA: Diagnosis present

## 2023-08-17 DIAGNOSIS — I10 Essential (primary) hypertension: Secondary | ICD-10-CM | POA: Diagnosis present

## 2023-08-17 DIAGNOSIS — E876 Hypokalemia: Secondary | ICD-10-CM | POA: Diagnosis present

## 2023-08-17 DIAGNOSIS — Z79899 Other long term (current) drug therapy: Secondary | ICD-10-CM | POA: Diagnosis not present

## 2023-08-17 DIAGNOSIS — Z87891 Personal history of nicotine dependence: Secondary | ICD-10-CM

## 2023-08-17 DIAGNOSIS — M810 Age-related osteoporosis without current pathological fracture: Secondary | ICD-10-CM | POA: Diagnosis present

## 2023-08-17 DIAGNOSIS — K9189 Other postprocedural complications and disorders of digestive system: Principal | ICD-10-CM | POA: Diagnosis present

## 2023-08-17 DIAGNOSIS — N319 Neuromuscular dysfunction of bladder, unspecified: Secondary | ICD-10-CM | POA: Diagnosis present

## 2023-08-17 DIAGNOSIS — Z833 Family history of diabetes mellitus: Secondary | ICD-10-CM

## 2023-08-17 DIAGNOSIS — Z6841 Body Mass Index (BMI) 40.0 and over, adult: Secondary | ICD-10-CM

## 2023-08-17 DIAGNOSIS — K5939 Other megacolon: Secondary | ICD-10-CM | POA: Diagnosis present

## 2023-08-17 DIAGNOSIS — M4800 Spinal stenosis, site unspecified: Secondary | ICD-10-CM | POA: Diagnosis present

## 2023-08-17 DIAGNOSIS — K567 Ileus, unspecified: Secondary | ICD-10-CM | POA: Diagnosis present

## 2023-08-17 DIAGNOSIS — K5981 Ogilvie syndrome: Secondary | ICD-10-CM | POA: Diagnosis present

## 2023-08-17 DIAGNOSIS — Z87442 Personal history of urinary calculi: Secondary | ICD-10-CM | POA: Diagnosis not present

## 2023-08-17 DIAGNOSIS — R319 Hematuria, unspecified: Secondary | ICD-10-CM | POA: Diagnosis present

## 2023-08-17 DIAGNOSIS — Z981 Arthrodesis status: Secondary | ICD-10-CM | POA: Diagnosis not present

## 2023-08-17 DIAGNOSIS — R110 Nausea: Secondary | ICD-10-CM

## 2023-08-17 DIAGNOSIS — K219 Gastro-esophageal reflux disease without esophagitis: Secondary | ICD-10-CM | POA: Diagnosis present

## 2023-08-17 DIAGNOSIS — Z8249 Family history of ischemic heart disease and other diseases of the circulatory system: Secondary | ICD-10-CM

## 2023-08-17 HISTORY — PX: FLEXIBLE SIGMOIDOSCOPY: SHX5431

## 2023-08-17 LAB — BASIC METABOLIC PANEL
Anion gap: 11 (ref 5–15)
BUN: 23 mg/dL (ref 8–23)
CO2: 26 mmol/L (ref 22–32)
Calcium: 9.4 mg/dL (ref 8.9–10.3)
Chloride: 106 mmol/L (ref 98–111)
Creatinine, Ser: 0.82 mg/dL (ref 0.61–1.24)
GFR, Estimated: >60 mL/min (ref >60)
Glucose, Bld: 104 mg/dL — ABNORMAL HIGH (ref 70–99)
Potassium: 3.7 mmol/L (ref 3.5–5.1)
Sodium: 143 mmol/L (ref 135–145)

## 2023-08-17 LAB — HEPATIC FUNCTION PANEL
ALT: 14 U/L (ref 0–44)
AST: 19 U/L (ref 15–41)
Albumin: 3.7 g/dL (ref 3.5–5.0)
Alkaline Phosphatase: 125 U/L (ref 38–126)
Bilirubin, Direct: 0.1 mg/dL (ref 0.0–0.2)
Indirect Bilirubin: 0.5 mg/dL (ref 0.3–0.9)
Total Bilirubin: 0.6 mg/dL (ref 0.3–1.2)
Total Protein: 8 g/dL (ref 6.5–8.1)

## 2023-08-17 LAB — URINALYSIS, ROUTINE W REFLEX MICROSCOPIC
Bacteria, UA: NONE SEEN
Bilirubin Urine: NEGATIVE
Glucose, UA: NEGATIVE mg/dL
Ketones, ur: 5 mg/dL — AB
Nitrite: NEGATIVE
Protein, ur: >=300 mg/dL — AB
RBC / HPF: >50 RBC/hpf (ref 0–5)
Specific Gravity, Urine: 1.019 (ref 1.005–1.030)
WBC, UA: >50 WBC/hpf (ref 0–5)
pH: 5 (ref 5.0–8.0)

## 2023-08-17 LAB — CBC
HCT: 37.5 % — ABNORMAL LOW (ref 39.0–52.0)
Hemoglobin: 11.7 g/dL — ABNORMAL LOW (ref 13.0–17.0)
MCH: 25.8 pg — ABNORMAL LOW (ref 26.0–34.0)
MCHC: 31.2 g/dL (ref 30.0–36.0)
MCV: 82.8 fL (ref 80.0–100.0)
Platelets: 236 10*3/uL (ref 150–400)
RBC: 4.53 MIL/uL (ref 4.22–5.81)
RDW: 15.5 % (ref 11.5–15.5)
WBC: 5.1 10*3/uL (ref 4.0–10.5)
nRBC: 0 % (ref 0.0–0.2)

## 2023-08-17 LAB — LIPASE, BLOOD: Lipase: 34 U/L (ref 11–51)

## 2023-08-17 SURGERY — SIGMOIDOSCOPY, FLEXIBLE
Anesthesia: Monitor Anesthesia Care

## 2023-08-17 MED ORDER — FENTANYL CITRATE (PF) 100 MCG/2ML IJ SOLN
INTRAMUSCULAR | Status: AC
  Filled 2023-08-17: qty 2

## 2023-08-17 MED ORDER — CHLORHEXIDINE GLUCONATE CLOTH 2 % EX PADS
6.0000 | MEDICATED_PAD | Freq: Every day | CUTANEOUS | Status: DC
  Administered 2023-08-17 – 2023-08-19 (×3): 6 via TOPICAL

## 2023-08-17 MED ORDER — MIDAZOLAM HCL (PF) 10 MG/2ML IJ SOLN
INTRAMUSCULAR | Status: DC | PRN
  Administered 2023-08-17: 2 mg via INTRAVENOUS

## 2023-08-17 MED ORDER — ONDANSETRON HCL 4 MG PO TABS: 4.0000 mg | ORAL_TABLET | Freq: Four times a day (QID) | ORAL | Status: DC | PRN

## 2023-08-17 MED ORDER — AMLODIPINE BESYLATE 5 MG PO TABS
5.0000 mg | ORAL_TABLET | Freq: Every day | ORAL | Status: DC
  Administered 2023-08-18: 5 mg via ORAL
  Filled 2023-08-17: qty 1

## 2023-08-17 MED ORDER — ORAL CARE MOUTH RINSE: 15.0000 mL | OROMUCOSAL | Status: DC | PRN

## 2023-08-17 MED ORDER — FENTANYL CITRATE (PF) 100 MCG/2ML IJ SOLN
INTRAMUSCULAR | Status: DC | PRN
  Administered 2023-08-17: 25 ug via INTRAVENOUS

## 2023-08-17 MED ORDER — ACETAMINOPHEN 650 MG RE SUPP: 650.0000 mg | Freq: Four times a day (QID) | RECTAL | Status: DC | PRN

## 2023-08-17 MED ORDER — ENOXAPARIN SODIUM 40 MG/0.4ML IJ SOSY
40.0000 mg | PREFILLED_SYRINGE | INTRAMUSCULAR | Status: DC
  Administered 2023-08-17: 40 mg via SUBCUTANEOUS
  Filled 2023-08-17: qty 0.4

## 2023-08-17 MED ORDER — ONDANSETRON HCL 4 MG/2ML IJ SOLN: 4.0000 mg | Freq: Four times a day (QID) | INTRAMUSCULAR | Status: DC | PRN

## 2023-08-17 MED ORDER — MIDAZOLAM HCL 2 MG/2ML IJ SOLN
INTRAMUSCULAR | Status: AC
  Filled 2023-08-17: qty 4

## 2023-08-17 MED ORDER — ACETAMINOPHEN 325 MG PO TABS: 650.0000 mg | ORAL_TABLET | Freq: Four times a day (QID) | ORAL | Status: DC | PRN

## 2023-08-17 MED ORDER — IOHEXOL 300 MG/ML  SOLN
100.0000 mL | Freq: Once | INTRAMUSCULAR | Status: AC | PRN
  Administered 2023-08-17: 100 mL via INTRAVENOUS

## 2023-08-17 NOTE — Progress Notes (Signed)
Procedure Note  The colonoscope was nota able to be passed beyond the rectum.  Stool obscured the entire visualization.  Earlier the patient did pass stool spontaneously and he is currently comfortable.  The patient received fentanyl 25 mg IV and versed 2 mg IV.  Plan; 1) Trial of enemas. 2) Move left and right in the bed. 3) Avoid narcotic medications. 4)  GI will assume care in the AM.

## 2023-08-17 NOTE — Plan of Care (Signed)
Discussed with patient plan of care for the evening, pain management and admission questions with some teach back displayed.  What is important is his bariatric bed and chronic foley which patient states was replaced by [place] on 10/29 with his stent replacement surgery  Problem: Health Behavior/Discharge Planning: Goal: Ability to manage health-related needs will improve Outcome: Progressing   Problem: Education: Goal: Knowledge of General Education information will improve Description: Including pain rating scale, medication(s)/side effects and non-pharmacologic comfort measures Outcome: Progressing

## 2023-08-17 NOTE — Progress Notes (Signed)
Upon arrival, bedside RN states that additional PIV is not needed at this time. RN to initiate additional IV team consult if needed.

## 2023-08-17 NOTE — H&P (Signed)
History and Physical    [name] [name] LKG:[id number] DOB: 02/20/1956 DOA: 08/17/2023  PCP: Renford Dills, MD   Chief Complaint:  abd distension  HPI: [name] [name] is a 67 y.o. male with medical history significant of CKD, GERD, hypertension, obesity who [facility] emergency department due to hematuria, abdominal pain.  He just had a year illogic procedure for for kidney stone extraction on 10/29.  Since his procedure he is had worsening abdominal distention and developed some hematuria today.  He presented to the ER where he was found to be hypertensive with systolics in the 180s and hemodynamically stable.  He had notable abdominal distention and underwent CT abdomen pelvis which showed interval placement of right ureteral stent with improvement of his hydronephrosis as well as right ureteral calculus resolution.  He had new left hydronephrosis.  There is also progressively worsening colonic ileus.  Surgery was consulted and recommended endoscopic decompression.  Labs were obtained which showed BMP largely unrevealing, hemoglobin 11.7, urinalysis concerning for infection.  GI was consulted for endoscopic decompression and recommended endoscopic decompression.  Patient was admitted to stepdown for close monitoring.  On evaluation he was endorsing abdominal pain and severe distention.  I spoke with radiology and cecal diameter was measured 8 cm.  Patient passed flatulence this morning.  Electrolytes are being repleted.  His hematuria since presentation has resolved and he has a Foley catheter in place without any bloody output   Review of Systems: Review of Systems  Constitutional:  Negative for fever.  HENT: Negative.    Eyes: Negative.   Respiratory: Negative.    Cardiovascular: Negative.   Gastrointestinal:  Positive for abdominal pain.  Genitourinary:  Negative for hematuria.  Musculoskeletal: Negative.   Skin: Negative.   Neurological: Negative.   Endo/Heme/Allergies: Negative.    Psychiatric/Behavioral: Negative.       As per HPI otherwise 10 point review of systems negative.   Allergies  Allergen Reactions   Unasyn [Ampicillin-Sulbactam Sodium] Hives, Itching, Swelling and Rash    Past Medical History:  Diagnosis Date   Anemia    Arthritis    Chronic kidney disease    HX acute kidney failure / acute pyelonephritis / hydronephrosis / severe sepsis per discharge summary 12/27/13   GERD (gastroesophageal reflux disease)    History of kidney stones    Hypertension    Morbid obesity (HCC)    Obstructive sleep apnea    does not need c pap since 110 lb wt loss   Osteoporosis    Prurigo 2002   Scars    ON ARMS FROM CHEMICAL EXPLOSION 1999   Spinal stenosis     Past Surgical History:  Procedure Laterality Date   ANTERIOR CERVICAL DECOMP/DISCECTOMY FUSION N/A 12/07/2018   Procedure: ANTERIOR CERVICAL DECOMPRESSION FUSION - CERVICAL THREE-CERVICAL FOUR, CERVICAL FOUR-CERVICAL FIVE, CERVICAL FIVE-CERVICAL SIX;  Surgeon: Julio Sicks, MD;  Location: [facility] OR;  Service: Neurosurgery;  Laterality: N/A;   BACK SURGERY  01/23/2010   lumbar   BIOPSY  11/26/2022   Procedure: BIOPSY;  Surgeon: Shellia Cleverly, DO;  Location: [facility];  Service: Gastroenterology;;   CHOLECYSTECTOMY N/A 03/24/2016   Procedure: LAPAROSCOPIC CHOLECYSTECTOMY WITH INTRAOPERATIVE CHOLANGIOGRAM;  Surgeon: Chevis Pretty III, MD;  Location: [facility];  Service: General;  Laterality: N/A;   CIRCUMCISION     CYSTOSCOPY WITH RETROGRADE PYELOGRAM, URETEROSCOPY AND STENT PLACEMENT Left 01/20/2014   Procedure: CYSTOSCOPY WITH RETROGRADE PYELOGRAM, URETEROSCOPY AND STENT EXCHANGE;  Surgeon: Milford Cage, MD;  Location: [facility];  Service: Urology;  Laterality: Left;  bugbee bladder fulguration   CYSTOSCOPY WITH STENT PLACEMENT Left 12/28/2013   Procedure: CYSTOSCOPY WITH STENT PLACEMENT left retrograde pyleogram;  Surgeon: Milford Cage, MD;  Location: [facility];  Service: Urology;   Laterality: Left;   CYSTOSCOPY/URETEROSCOPY/HOLMIUM LASER/STENT PLACEMENT Right 05/15/2021   Procedure: CYSTOSCOPY RIGHT RETROGRADE RIGHT URETEROSCOPY/HOLMIUM LASER/STENT PLACEMENT;  Surgeon: Bjorn Pippin, MD;  Location: [facility];  Service: Urology;  Laterality: Right;   CYSTOSCOPY/URETEROSCOPY/HOLMIUM LASER/STENT PLACEMENT Right 07/08/2023   Procedure: CYSTOSCOPY, RIGHT RETROGRADE PYELOGRAM, RIGHT URETERAL STENT PLACEMENT;  Surgeon: Bjorn Pippin, MD;  Location: [facility];  Service: Urology;  Laterality: Right;  60 MINS FOR CASE   CYSTOSCOPY/URETEROSCOPY/HOLMIUM LASER/STENT PLACEMENT Right 08/12/2023   Procedure: CYSTOSCOPY RIGHT   URETEROSCOPY/HOLMIUM LASER/STENT EXCHANGE;  Surgeon: Bjorn Pippin, MD;  Location: [facility];  Service: Urology;  Laterality: Right;   ESOPHAGOGASTRODUODENOSCOPY N/A 12/07/2012   Procedure: ESOPHAGOGASTRODUODENOSCOPY (EGD);  Surgeon: Lodema Pilot, DO;  Location: [facility];  Service: General;  Laterality: N/A;   ESOPHAGOGASTRODUODENOSCOPY (EGD) WITH PROPOFOL N/A 11/26/2022   Procedure: ESOPHAGOGASTRODUODENOSCOPY (EGD) WITH PROPOFOL;  Surgeon: Shellia Cleverly, DO;  Location: [facility];  Service: Gastroenterology;  Laterality: N/A;   EYE SURGERY Bilateral 2024   cataracts removed   FLEXIBLE SIGMOIDOSCOPY N/A 05/01/2023   Procedure: FLEXIBLE SIGMOIDOSCOPY;  Surgeon: Iva Boop, MD;  Location: [facility];  Service: Gastroenterology;  Laterality: N/A;   HOLMIUM LASER APPLICATION Left 01/20/2014   Procedure: HOLMIUM LASER APPLICATION;  Surgeon: Milford Cage, MD;  Location: [facility];  Service: Urology;  Laterality: Left;   KNEE ARTHROSCOPY Left 06/29/2018   Procedure: ARTHROSCOPY KNEE;  Surgeon: Kerrin Champagne, MD;  Location: [facility];  Service: Orthopedics;  Laterality: Left;   LAPAROSCOPIC GASTRIC SLEEVE RESECTION N/A 12/07/2012   Procedure: LAPAROSCOPIC GASTRIC SLEEVE RESECTION;  Surgeon: Lodema Pilot, DO;  Location: [facility];  Service: General;  Laterality: N/A;   laparoscopic sleeve gastrectomy with EGD   LUMBAR LAMINECTOMY N/A 11/17/2017   Procedure: L2-3 LAMINECTOMY AND REDO LAMINECTOMIES  L3-4, L4-5 AND L5-S1;  Surgeon: Kerrin Champagne, MD;  Location: [facility] OR;  Service: Orthopedics;  Laterality: N/A;   LUMBAR WOUND DEBRIDEMENT N/A 06/29/2018   Procedure: LUMBAR WOUND DEBRIDEMENT DRAINAGE AND IRRIGATION; AND ASPIRATION OF LEFT KNEE;  Surgeon: Kerrin Champagne, MD;  Location: [facility] OR;  Service: Orthopedics;  Laterality: N/A;   SAVORY DILATION N/A 11/26/2022   Procedure: SAVORY DILATION;  Surgeon: Shellia Cleverly, DO;  Location: [facility];  Service: Gastroenterology;  Laterality: N/A;   TEE WITHOUT CARDIOVERSION N/A 07/01/2018   Procedure: TRANSESOPHAGEAL ECHOCARDIOGRAM (TEE);  Surgeon: Laurey Morale, MD;  Location: [facility] END[facility]COPY;  Service: Cardiovascular;  Laterality: N/A;     reports that he quit smoking about 49 years ago. His smoking use included cigarettes. He has never used smokeless tobacco. He reports that he does not drink alcohol and does not use drugs.  Family History  Problem Relation Age of Onset   Diabetes Mother    Heart disease Father    Colon cancer Neg Hx    Colon polyps Neg Hx    Esophageal cancer Neg Hx    Rectal cancer Neg Hx    Stomach cancer Neg Hx     Prior to Admission medications   Medication Sig Start Date End Date Taking? Authorizing Provider  acetaminophen (TYLENOL) 500 MG tablet Take 1,000 mg by mouth as needed for moderate pain (pain score 4-6).   Yes [provider]  amLODipine (NORVASC) 5 MG tablet Take 5  mg by mouth in the morning. 10/28/22  Yes [provider]  cetirizine (ZYRTEC) 10 MG tablet Take 10 mg by mouth daily as needed for allergies.   Yes [provider]  Cholecalciferol (VITAMIN D3 PO) Take 1 tablet by mouth daily.   Yes [provider]  ciprofloxacin (CIPRO) 500 MG tablet Take 1 tablet (500 mg total) by mouth 2 (two) times daily. 08/12/23  Yes Bjorn Pippin, MD   Cyanocobalamin (VITAMIN B-12 PO) Take 1 capsule by mouth daily.   Yes [provider]  FERROUS GLUCONATE PO Take 1 tablet by mouth daily.   Yes [provider]  traMADol (ULTRAM) 50 MG tablet Take 1 tablet (50 mg total) by mouth every 6 (six) hours as needed for moderate pain (pain score 4-6). 08/12/23  Yes Bjorn Pippin, MD  linezolid (ZYVOX) 600 MG tablet Take 600 mg by mouth 2 (two) times daily. Patient not taking: Reported on 08/17/2023 07/29/23   [provider]  tamsulosin (FLOMAX) 0.4 MG CAPS capsule Take 1 capsule (0.4 mg total) by mouth daily. Patient not taking: Reported on 08/17/2023 05/04/23   Lewie Chamber, MD    Physical Exam: Vitals:   08/17/23 1310 08/17/23 1400 08/17/23 1744 08/17/23 1820  BP: (!) 175/97 138/84  (!) 151/93  Pulse: (!) 58 (!) 56  64  Resp: 18 18  18   Temp: (!) 97.2 F (36.2 C)  97.9 F (36.6 C)   TempSrc: Oral  Oral   SpO2: 99% 98%  99%  Weight:      Height:       Physical Exam Constitutional:      Appearance: He is obese.  HENT:     Head: Normocephalic.     Nose: Nose normal.     Mouth/Throat:     Mouth: Mucous membranes are moist.     Pharynx: Oropharynx is clear.  Eyes:     Pupils: Pupils are equal, round, and reactive to light.  Cardiovascular:     Rate and Rhythm: Normal rate and regular rhythm.     Pulses: Normal pulses.     Heart sounds: Normal heart sounds.  Pulmonary:     Effort: Pulmonary effort is normal.     Breath sounds: Normal breath sounds.  Abdominal:     General: Abdomen is flat. Bowel sounds are normal.  Musculoskeletal:        General: Normal range of motion.     Cervical back: Normal range of motion.  Skin:    General: Skin is warm.     Capillary Refill: Capillary refill takes less than 2 seconds.  Neurological:     General: No focal deficit present.     Mental Status: He is alert. Mental status is at baseline.  Psychiatric:        Mood and Affect: Mood normal.        Labs on  Admission: I have personally reviewed the patients's labs and imaging studies.  Assessment/Plan Principal Problem:   Ileus (HCC)   # Ogilvie syndrome - Present with abdominal distention found of a cecal diameter of 8.1 cm.  Previously followed by gastroenterology and was admitted in July with an ileus and was given.  Is taking and underwent flex sig  Plan: Consulted GI, Dr. Elnoria Howard who will evaluate the patient and plan for endoscopic decompression Optimize electrolytes N.p.o.  # Hematuria - Patient seen by urology on 10/29 and underwent cystoscopy with laser and stone extraction with exchange of stents patient has had  numerous prior procedures for removal of stones  Plan: Patient is hemoglobin remained stable and given recent procedure would expect some blood loss.  No hematuria noted on exam Will focus on addressing Ogilvie syndrome tonight and may need urology consult tomorrow if hematuria recurs  # Hypertension-continue amlodipine  # Chronic anemia-patient's hemoglobin 11.7 is typically around 10 will trend    Admission status: Inpatient Stepdown  Certification: The appropriate patient status for this patient is INPATIENT. Inpatient status is judged to be reasonable and necessary in order to provide the required intensity of service to ensure the patient's safety. The patient's presenting symptoms, physical exam findings, and initial radiographic and laboratory data in the context of their chronic comorbidities is felt to place them at high risk for further clinical deterioration. Furthermore, it is not anticipated that the patient will be medically stable for discharge from the hospital within 2 midnights of admission.   * I certify that at the point of admission it is my clinical judgment that the patient will require inpatient hospital care spanning beyond 2 midnights from the point of admission due to high intensity of service, high risk for further deterioration and high  frequency of surveillance required.Alan Mulder MD [facility] If 7PM-7AM, please contact night-coverage [email or web address]  08/17/2023, 6:35 PM

## 2023-08-17 NOTE — Progress Notes (Signed)
Pt with colonic ileus and no signs of obstruction.  Abd nontender and severely distended.  No surgical issues at this time.    Vanita Panda, MD  Colorectal and General Surgery [facility] Surgery

## 2023-08-17 NOTE — Consult Note (Signed)
CONSULT NOTE FOR [name]  Reason for Consult: Colonic ileus Referring Physician: [facility]  [name] HPI: This is a 67 year old male with a chronic colonic ileus, neurogenic bladder, spinal stenosis, and renal stones admitted with worsening abdominal distension.  He reports that his abdominal distension increased by 2 inches over the past couple of days.  Recently he underwent  a cystoscope with right ureteroscopy for stone extraction and stenting.  He was under general anesthesia.  The surgery went well, but he started to experience a worsening of his colonic distension.  He started to experience some mild nausea and abdominal pain.  A CT scan of his abdomen showed that he had a worsening of his colonic ileus.  He was evaluated by Surgery and he did not appear to require any surgical intervention.  Dr. Leone Payor performed an endoscopic decompression on 05/01/2023, which improved his colonic ileus.  The decompression was performed after he failed conservative management.  Past Medical History:  Diagnosis Date   Anemia    Arthritis    Chronic kidney disease    HX acute kidney failure / acute pyelonephritis / hydronephrosis / severe sepsis per discharge summary 12/27/13   GERD (gastroesophageal reflux disease)    History of kidney stones    Hypertension    Morbid obesity (HCC)    Obstructive sleep apnea    does not need c pap since 110 lb wt loss   Osteoporosis    Prurigo 2002   Scars    ON ARMS FROM CHEMICAL EXPLOSION 1999   Spinal stenosis     Past Surgical History:  Procedure Laterality Date   ANTERIOR CERVICAL DECOMP/DISCECTOMY FUSION N/A 12/07/2018   Procedure: ANTERIOR CERVICAL DECOMPRESSION FUSION - CERVICAL THREE-CERVICAL FOUR, CERVICAL FOUR-CERVICAL FIVE, CERVICAL FIVE-CERVICAL SIX;  Surgeon: Julio Sicks, MD;  Location: [facility];  Service: Neurosurgery;  Laterality: N/A;   BACK SURGERY  01/23/2010   lumbar   BIOPSY  11/26/2022   Procedure: BIOPSY;  Surgeon:  Shellia Cleverly, DO;  Location: [facility];  Service: Gastroenterology;;   CHOLECYSTECTOMY N/A 03/24/2016   Procedure: LAPAROSCOPIC CHOLECYSTECTOMY WITH INTRAOPERATIVE CHOLANGIOGRAM;  Surgeon: Chevis Pretty III, MD;  Location: [facility];  Service: General;  Laterality: N/A;   CIRCUMCISION     CYSTOSCOPY WITH RETROGRADE PYELOGRAM, URETEROSCOPY AND STENT PLACEMENT Left 01/20/2014   Procedure: CYSTOSCOPY WITH RETROGRADE PYELOGRAM, URETEROSCOPY AND STENT EXCHANGE;  Surgeon: Milford Cage, MD;  Location: [facility];  Service: Urology;  Laterality: Left;  bugbee bladder fulguration   CYSTOSCOPY WITH STENT PLACEMENT Left 12/28/2013   Procedure: CYSTOSCOPY WITH STENT PLACEMENT left retrograde pyleogram;  Surgeon: Milford Cage, MD;  Location: [facility];  Service: Urology;  Laterality: Left;   CYSTOSCOPY/URETEROSCOPY/HOLMIUM LASER/STENT PLACEMENT Right 05/15/2021   Procedure: CYSTOSCOPY RIGHT RETROGRADE RIGHT URETEROSCOPY/HOLMIUM LASER/STENT PLACEMENT;  Surgeon: Bjorn Pippin, MD;  Location: [facility];  Service: Urology;  Laterality: Right;   CYSTOSCOPY/URETEROSCOPY/HOLMIUM LASER/STENT PLACEMENT Right 07/08/2023   Procedure: CYSTOSCOPY, RIGHT RETROGRADE PYELOGRAM, RIGHT URETERAL STENT PLACEMENT;  Surgeon: Bjorn Pippin, MD;  Location: [facility];  Service: Urology;  Laterality: Right;  60 MINS FOR CASE   CYSTOSCOPY/URETEROSCOPY/HOLMIUM LASER/STENT PLACEMENT Right 08/12/2023   Procedure: CYSTOSCOPY RIGHT   URETEROSCOPY/HOLMIUM LASER/STENT EXCHANGE;  Surgeon: Bjorn Pippin, MD;  Location: [facility];  Service: Urology;  Laterality: Right;   ESOPHAGOGASTRODUODENOSCOPY N/A 12/07/2012   Procedure: ESOPHAGOGASTRODUODENOSCOPY (EGD);  Surgeon: Lodema Pilot, DO;  Location: [facility];  Service: General;  Laterality: N/A;   ESOPHAGOGASTRODUODENOSCOPY (EGD) WITH PROPOFOL N/A 11/26/2022  Procedure: ESOPHAGOGASTRODUODENOSCOPY (EGD) WITH PROPOFOL;  Surgeon: Shellia Cleverly, DO;  Location: [facility];  Service: Gastroenterology;   Laterality: N/A;   EYE SURGERY Bilateral 2024   cataracts removed   FLEXIBLE SIGMOIDOSCOPY N/A 05/01/2023   Procedure: FLEXIBLE SIGMOIDOSCOPY;  Surgeon: Iva Boop, MD;  Location: [facility];  Service: Gastroenterology;  Laterality: N/A;   HOLMIUM LASER APPLICATION Left 01/20/2014   Procedure: HOLMIUM LASER APPLICATION;  Surgeon: Milford Cage, MD;  Location: [facility];  Service: Urology;  Laterality: Left;   KNEE ARTHROSCOPY Left 06/29/2018   Procedure: ARTHROSCOPY KNEE;  Surgeon: Kerrin Champagne, MD;  Location: [facility] OR;  Service: Orthopedics;  Laterality: Left;   LAPAROSCOPIC GASTRIC SLEEVE RESECTION N/A 12/07/2012   Procedure: LAPAROSCOPIC GASTRIC SLEEVE RESECTION;  Surgeon: Lodema Pilot, DO;  Location: [facility];  Service: General;  Laterality: N/A;  laparoscopic sleeve gastrectomy with EGD   LUMBAR LAMINECTOMY N/A 11/17/2017   Procedure: L2-3 LAMINECTOMY AND REDO LAMINECTOMIES  L3-4, L4-5 AND L5-S1;  Surgeon: Kerrin Champagne, MD;  Location: [facility];  Service: Orthopedics;  Laterality: N/A;   LUMBAR WOUND DEBRIDEMENT N/A 06/29/2018   Procedure: LUMBAR WOUND DEBRIDEMENT DRAINAGE AND IRRIGATION; AND ASPIRATION OF LEFT KNEE;  Surgeon: Kerrin Champagne, MD;  Location: [facility];  Service: Orthopedics;  Laterality: N/A;   SAVORY DILATION N/A 11/26/2022   Procedure: SAVORY DILATION;  Surgeon: Shellia Cleverly, DO;  Location: [facility];  Service: Gastroenterology;  Laterality: N/A;   TEE WITHOUT CARDIOVERSION N/A 07/01/2018   Procedure: TRANSESOPHAGEAL ECHOCARDIOGRAM (TEE);  Surgeon: Laurey Morale, MD;  Location: [facility] ENDOSCOPY;  Service: Cardiovascular;  Laterality: N/A;    Family History  Problem Relation Age of Onset   Diabetes Mother    Heart disease Father    Colon cancer Neg Hx    Colon polyps Neg Hx    Esophageal cancer Neg Hx    Rectal cancer Neg Hx    Stomach cancer Neg Hx     Social History:  reports that he quit smoking about 49 years ago. His smoking use included  cigarettes. He has never used smokeless tobacco. He reports that he does not drink alcohol and does not use drugs.  Allergies:  Allergies  Allergen Reactions   Unasyn [Ampicillin-Sulbactam Sodium] Hives, Itching, Swelling and Rash    Medications: Scheduled:  [START ON 08/18/2023] amLODipine  5 mg Oral Daily   Chlorhexidine Gluconate Cloth  6 each Topical Daily   enoxaparin (LOVENOX) injection  40 mg Subcutaneous Q24H   Continuous:  Results for orders placed or performed during the hospital encounter of 08/17/23 (from the past 24 hour(s))  Basic metabolic panel     Status: Abnormal   Collection Time: 08/17/23 10:30 AM  Result Value Ref Range   Sodium 143 135 - 145 mmol/L   Potassium 3.7 3.5 - 5.1 mmol/L   Chloride 106 98 - 111 mmol/L   CO2 26 22 - 32 mmol/L   Glucose, Bld 104 (H) 70 - 99 mg/dL   BUN 23 8 - 23 mg/dL   Creatinine, Ser 3.66 0.61 - 1.24 mg/dL   Calcium 9.4 8.9 - 44.0 mg/dL   GFR, Estimated >34 >74 mL/min   Anion gap 11 5 - 15  CBC     Status: Abnormal   Collection Time: 08/17/23 10:30 AM  Result Value Ref Range   WBC 5.1 4.0 - 10.5 K/uL   RBC 4.53 4.22 - 5.81 MIL/uL   Hemoglobin 11.7 (L) 13.0 - 17.0 g/dL  HCT 37.5 (L) 39.0 - 52.0 %   MCV 82.8 80.0 - 100.0 fL   MCH 25.8 (L) 26.0 - 34.0 pg   MCHC 31.2 30.0 - 36.0 g/dL   RDW 16.1 09.6 - 04.5 %   Platelets 236 150 - 400 K/uL   nRBC 0.0 0.0 - 0.2 %  Urinalysis, Routine w reflex microscopic -Urine, Catheterized; Indwelling urinary catheter     Status: Abnormal   Collection Time: 08/17/23 12:15 PM  Result Value Ref Range   Color, Urine RED (A) YELLOW   APPearance CLOUDY (A) CLEAR   Specific Gravity, Urine 1.019 1.005 - 1.030   pH 5.0 5.0 - 8.0   Glucose, UA NEGATIVE NEGATIVE mg/dL   Hgb urine dipstick LARGE (A) NEGATIVE   Bilirubin Urine NEGATIVE NEGATIVE   Ketones, ur 5 (A) NEGATIVE mg/dL   Protein, ur >=409 (A) NEGATIVE mg/dL   Nitrite NEGATIVE NEGATIVE   Leukocytes,Ua MODERATE (A) NEGATIVE   RBC / HPF  >50 0 - 5 RBC/hpf   WBC, UA >50 0 - 5 WBC/hpf   Bacteria, UA NONE SEEN NONE SEEN   Squamous Epithelial / HPF 0-5 0 - 5 /HPF   Mucus PRESENT      DG Abd 1 View  Result Date: 08/17/2023 CLINICAL DATA:  Measures cecal diameter EXAM: ABDOMEN - 1 VIEW COMPARISON:  CT 08/17/2023, radiograph 05/02/2023, 05/03/2023 FINDINGS: Excreted contrast within the left renal collecting system with mild hydronephrosis. Contrast in the urinary bladder. Right-sided ureteral stent. Considerable gaseous dilatation of the bowel, air distended colon measuring up to 10.5 cm in the left upper quadrant. Estimated cecal diameter of 8.1 cm. IMPRESSION: 1. Considerable gaseous dilatation of the bowel, with distended colon measuring up to 10.5 cm in the left upper quadrant. 2. Excreted contrast within the left renal collecting system with mild hydronephrosis. Right-sided ureteral stent. Electronically Signed   By: Jasmine Pang M.D.   On: 08/17/2023 17:51   CT ABDOMEN PELVIS W CONTRAST  Result Date: 08/17/2023 CLINICAL DATA:  Abdominal pain. EXAM: CT ABDOMEN AND PELVIS WITH CONTRAST TECHNIQUE: Multidetector CT imaging of the abdomen and pelvis was performed using the standard protocol following bolus administration of intravenous contrast. RADIATION DOSE REDUCTION: This exam was performed according to the departmental dose-optimization program which includes automated exposure control, adjustment of the mA and/or kV according to patient size and/or use of iterative reconstruction technique. CONTRAST:  OMNIPAQUE IOHEXOL 300 MG/ML  SOLN COMPARISON:  CT abdomen pelvis dated 04/29/2023. FINDINGS: Evaluation is limited due to body habitus. Lower chest: The visualized lung bases are clear. No intra-abdominal free air or free fluid. Hepatobiliary: The liver is unremarkable. There is mild biliary dilatation, post cholecystectomy. No retained calcified stone noted in the central CBD. Pancreas: Unremarkable. No pancreatic ductal dilatation  or surrounding inflammatory changes. Spleen: Normal in size without focal abnormality. Adrenals/Urinary Tract: The left adrenal gland is unremarkable. There is nodular thickening of the medial limb of the right adrenal gland similar to prior CT. Multiple nonobstructing bilateral renal calculi measuring up to 15 mm in the inferior pole of the right kidney and 16 mm in the inferior pole of the left kidney. There has been interval placement of a pigtail right ureteral stent with proximal tip in the right renal pelvis and distal end in the urinary bladder. The previously seen right ureteral calculus is no longer visualized. Interval improvement in the right-sided hydronephrosis since the prior CT. Mild residual right hydronephrosis remains. There is thickened appearance of the urothelium of  the right renal collecting system and ureter which may represent chronic UTI. Correlation with urinalysis recommended. There is mild left hydronephrosis, new since the prior CT. No stone identified in the left ureter. The urinary bladder is decompressed around a Foley catheter. The previously seen stone within the bladder is not visualized on today's exam and has likely passed. Stomach/Bowel: Postsurgical changes of gastric sleeve. There is progressive diffuse air distention of the colon without mechanical obstruction consistent with colonic ileus. No evidence of small-bowel obstruction. The appendix is normal. Vascular/Lymphatic: Mild aortoiliac atherosclerotic disease. The IVC is unremarkable. No portal venous gas. There is no adenopathy. Reproductive: The prostate is grossly unremarkable.  No pelvic mass. Other: None Musculoskeletal: Degenerative changes of the spine. No acute osseous pathology. IMPRESSION: 1. Interval placement of a pigtail right ureteral stent with interval improvement in the right-sided hydronephrosis since the prior CT. Mild residual right hydronephrosis remains. 2. The right ureteral calculus as well as the  calculus in the urinary bladder seen on the prior CT are no longer present. 3. Multiple nonobstructing bilateral renal calculi. 4. Mild left hydronephrosis, new since the prior CT. No stone identified in the left ureter. 5. Progressive colonic ileus. No mechanical obstruction. Normal appendix. 6.  Aortic Atherosclerosis (ICD10-I70.0). Electronically Signed   By: Elgie Collard M.D.   On: 08/17/2023 15:42    ROS:  As stated above in the HPI otherwise negative.  Blood pressure (!) 108/55, pulse 61, temperature 97.9 F (36.6 C), temperature source Oral, resp. rate 18, height 5\' 10"  (1.778 m), weight (!) 166 kg, SpO2 97%.    PE: Gen: NAD, Alert and Oriented HEENT:  [place]/AT, EOMI Neck: Supple, no LAD Lungs: CTA Bilaterally CV: RRR without M/G/R ABD: Soft, markedly distended abdomen, tense, tympanic, +BS Ext: No C/C/E  Assessment/Plan: 1) Acute exacerbation of colonic ileus. 2) Abdominal pain. 3) Mild nausea.   He is not in acute distress and he has a history of the colonic ileus.  The patient did report having some abdominal pain earlier.  He had a bowel movement just prior to evaluation.  Even though he does have some motility, with his severe distension, it is prudent to perform a decompression.  It is unlikely that he will be fully decompressed, but enough to provide some relief.  Plan: 1) FFS with decompression.  Kaari Zeigler D 08/17/2023, 8:41 PM

## 2023-08-17 NOTE — ED Triage Notes (Signed)
Patient here from home reporting blood in urine with fullness to bladder. Reports chronic foley, 10/29 stone with extraction.

## 2023-08-17 NOTE — ED Provider Triage Note (Signed)
Emergency Medicine Provider Triage Evaluation Note  [name] [name] , a 67 y.o. male  was evaluated in triage.  Pt complains of abdominal bloating and hematuria since this AM. The patient recently underwent surgery for stent placement. He was having leaking around his catheter and reports he was supposed to be sent in a medication, but the pharmacy never received it. He reports that he woke up this morning with abdominal bloating and blood on his chucks. Reports some nausea yesterday, but no vomiting. Reports he does have a h/o " Lazy boweL" but he has been having small Bms since surgery.   Review of Systems  Positive:  Negative:   Physical Exam  BP (!) 180/138 (BP Location: Left Arm)   Pulse 71   Temp 98.7 F (37.1 C) (Oral)   Resp 20   Ht 5\' 10"  (1.778 m)   Wt (!) 166 kg   SpO2 100%   BMI 52.52 kg/m  Gen:   Awake, no distress   Resp:  Normal effort  MSK:   Moves extremities without difficulty  Other:  Large, protuberent abdomen that is firm and taut.   Medical Decision Making  Medically screening exam initiated at 10:53 AM.  Appropriate orders placed.  Izaih Kataoka was informed that the remainder of the evaluation will be completed by another provider, this initial triage assessment does not replace that evaluation, and the importance of remaining in the ED until their evaluation is complete.  From reading note from July 2024, same description with abdominal presentation. Will order CT scan and labs. The patient does not appear in any acute distress.    Achille Rich, PA-C 08/17/23 1056

## 2023-08-17 NOTE — ED Provider Notes (Addendum)
EMERGENCY DEPARTMENT AT [facility] Provider Note   CSN: [id number] Arrival date & time: 08/17/23  [id number]     History  Chief Complaint  Patient presents with   Hematuria    [name] [name] is a 67 y.o. male.  The history is provided by the patient, medical records and the spouse. No language interpreter was used.  Hematuria This is a recurrent problem. The current episode started more than 2 days ago. The problem occurs daily. The problem has not changed since onset.Associated symptoms include abdominal pain. Pertinent negatives include no chest pain, no headaches and no shortness of breath. Nothing aggravates the symptoms. Nothing relieves the symptoms. He has tried nothing for the symptoms. The treatment provided no relief.       Home Medications Prior to Admission medications   Medication Sig Start Date End Date Taking? Authorizing Provider  acetaminophen (TYLENOL) 500 MG tablet Take 1,000 mg by mouth every 6 (six) hours as needed for moderate pain.    [provider]  amLODipine (NORVASC) 5 MG tablet Take 5 mg by mouth in the morning. 10/28/22   [provider]  cetirizine (ZYRTEC) 10 MG tablet Take 10 mg by mouth daily as needed for allergies.    [provider]  Cholecalciferol (VITAMIN D3 PO) Take 1 tablet by mouth daily.    [provider]  ciprofloxacin (CIPRO) 500 MG tablet Take 1 tablet (500 mg total) by mouth 2 (two) times daily. 08/12/23   Bjorn Pippin, MD  Cyanocobalamin (VITAMIN B-12 PO) Take 1,000 mcg by mouth daily.    [provider]  FERROUS GLUCONATE PO Take 1 tablet by mouth daily.    [provider]  tamsulosin (FLOMAX) 0.4 MG CAPS capsule Take 1 capsule (0.4 mg total) by mouth daily. 05/04/23   Lewie Chamber, MD  traMADol (ULTRAM) 50 MG tablet Take 1 tablet (50 mg total) by mouth every 6 (six) hours as needed for moderate pain (pain score 4-6). 08/12/23   Bjorn Pippin, MD  trolamine  salicylate (ASPERCREME) 10 % cream Apply 1 Application topically as needed for muscle pain.    [provider]      Allergies    Unasyn [ampicillin-sulbactam sodium]    Review of Systems   Review of Systems  Constitutional:  Negative for chills, fatigue and fever.  HENT:  Negative for congestion.   Respiratory:  Negative for cough, chest tightness, shortness of breath and wheezing.   Cardiovascular:  Negative for chest pain.  Gastrointestinal:  Positive for abdominal distention, abdominal pain and nausea. Negative for constipation, diarrhea and vomiting.  Genitourinary:  Positive for hematuria. Negative for dysuria and flank pain.  Musculoskeletal:  Negative for back pain and neck pain.  Skin:  Negative for rash and wound.  Neurological:  Negative for headaches.  Psychiatric/Behavioral:  Negative for agitation and confusion.   All other systems reviewed and are negative.   Physical Exam Updated Vital Signs BP (!) 180/138 (BP Location: Left Arm)   Pulse 71   Temp 98.7 F (37.1 C) (Oral)   Resp 20   Ht 5\' 10"  (1.778 m)   Wt (!) 166 kg   SpO2 100%   BMI 52.52 kg/m  Physical Exam Vitals and nursing note reviewed.  Constitutional:      General: He is not in acute distress.    Appearance: He is not ill-appearing, toxic-appearing or diaphoretic.  HENT:     Head: Normocephalic.     Nose: No congestion  or rhinorrhea.     Mouth/Throat:     Mouth: Mucous membranes are moist.     Pharynx: No oropharyngeal exudate or posterior oropharyngeal erythema.  Eyes:     Pupils: Pupils are equal, round, and reactive to light.  Cardiovascular:     Rate and Rhythm: Normal rate.     Pulses: Normal pulses.     Heart sounds: No murmur heard. Pulmonary:     Breath sounds: No wheezing, rhonchi or rales.  Chest:     Chest wall: No tenderness.  Abdominal:     General: There is distension.     Tenderness: There is no abdominal tenderness. There is no right CVA tenderness, left CVA  tenderness, guarding or rebound.  Musculoskeletal:     Cervical back: Normal range of motion.  Skin:    Findings: No erythema.  Neurological:     Mental Status: He is alert.     ED Results / Procedures / Treatments   Labs (all labs ordered are listed, but only abnormal results are displayed) Labs Reviewed  URINALYSIS, ROUTINE W REFLEX MICROSCOPIC - Abnormal; Notable for the following components:      Result Value   Color, Urine RED (*)    APPearance CLOUDY (*)    Hgb urine dipstick LARGE (*)    Ketones, ur 5 (*)    Protein, ur >=300 (*)    Leukocytes,Ua MODERATE (*)    All other components within normal limits  BASIC METABOLIC PANEL - Abnormal; Notable for the following components:   Glucose, Bld 104 (*)    All other components within normal limits  CBC - Abnormal; Notable for the following components:   Hemoglobin 11.7 (*)    HCT 37.5 (*)    MCH 25.8 (*)    All other components within normal limits  URINE CULTURE  HEPATIC FUNCTION PANEL  LIPASE, BLOOD    EKG None  Radiology No results found.  Procedures Procedures    Medications Ordered in ED Medications  iohexol (OMNIPAQUE) 300 MG/ML solution 100 mL (100 mLs Intravenous Contrast Given 08/17/23 1427)    ED Course/ Medical Decision Making/ A&P                                 Medical Decision Making Amount and/or Complexity of Data Reviewed Labs: ordered.    [name] [name] is a 67 y.o. 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  According to patient, he has had ileus before when his bowel slow down and thinks that is happening again.  He reports that some nausea but is still getting some intake.  Denies any vomiting.  Reports his abdomen is  distended, swelling, and he had some discomfort earlier.  He reports no trauma.  He denies any constipation or diarrhea and he is has had some bowel movement.  Denies any chest pain, shortness of breath, or cough.  Denies any diarrhea and has had some small bowel movements over the last few days.  He is worried about his bowel slowing down again.  He does report he had some bleeding in his Foley catheter and from around the catheter site but he reports the catheter is draining normally.  On exam, lungs  clear.  Chest nontender.  Abdomen was not focally tender but is quite distended and taut.  Some tympanic sounds when percussed present.  Decreased bowel sounds.  Flanks nontender.  Clear urine going through the urinary catheter.  No leg tenderness or change in swelling that is chronic he reports.  Clinically concerned about ileus.  Will get CT abdomen pelvis and will get urinalysis and labs.  Suspect some hematuria is from his recent procedure and family is not as concerned about that now.  Will get labs and imaging and reassess.  Anticipate reassessment after workup.  Patient awaiting results of CT on pelvis.  Given the distention and my concern for bowel obstruction versus ileus, anticipate admission for further management once imaging is completed.  Care transferred oncoming team to wait for results of imaging and reassessment.   3:42 PM General Surgery is in the emergency department and was asked to evaluate the patient due to the distention and concern for possible obstruction.  They will evaluate but anticipate we may need GI involvement if this is a large bowel and not a small bowel obstruction.  Will wait for general surgery recommendation, official read, and care be transferred to oncoming team to wait for likely admission.     Final Clinical Impression(s) / ED Diagnoses Final diagnoses:  Abdominal distension  Nausea  Abdominal discomfort    Clinical Impression: 1. Abdominal  distension   2. Nausea   3. Abdominal discomfort     Disposition: Care transferred oncoming team to wait for results of imaging and reassessment.  This note was prepared with assistance of Conservation officer, historic buildings. Occasional wrong-word or sound-a-like substitutions may have occurred due to the inherent limitations of voice recognition software.      Katelan Hirt, Canary Brim, MD 08/17/23 1536    Jayna Mulnix, Canary Brim, MD 08/17/23 [phone]

## 2023-08-17 NOTE — ED Provider Notes (Signed)
Patient signed out to me at 1530 by Dr. Rush Landmark pending CT read. In short this is a 67 year old me with foley catheter dependence with recent ureteral stent placement presenting to the emergency department with abdominal distension and nausea as well as hematuria.  Patient's workup showed hematuria but no bacteriuria making UTI unlikely, hemoglobin is stable.  CT does show progressive colonic ileus.  We spoke with Dr. Maisie Fus with general surgery who will evaluate the patient at bedside but suspect will likely need GI consult for decompression and recommended hospitalist admission.  On my evaluation, the patient is hemodynamically stable no acute pain.  Abdomen is significantly distended patient is not having any nausea or vomiting states that he has been passing gas and did have a small bowel movement this morning.   Rexford Maus, DO 08/17/23 1627

## 2023-08-18 ENCOUNTER — Inpatient Hospital Stay (HOSPITAL_COMMUNITY): Payer: Medicare Other

## 2023-08-18 DIAGNOSIS — R935 Abnormal findings on diagnostic imaging of other abdominal regions, including retroperitoneum: Secondary | ICD-10-CM | POA: Diagnosis not present

## 2023-08-18 DIAGNOSIS — R140 Abdominal distension (gaseous): Secondary | ICD-10-CM

## 2023-08-18 DIAGNOSIS — K567 Ileus, unspecified: Secondary | ICD-10-CM | POA: Diagnosis not present

## 2023-08-18 LAB — URINE CULTURE: Culture: NO GROWTH

## 2023-08-18 LAB — BASIC METABOLIC PANEL
Anion gap: 9 (ref 5–15)
Anion gap: 8 (ref 5–15)
BUN: 20 mg/dL (ref 8–23)
BUN: 16 mg/dL (ref 8–23)
CO2: 26 mmol/L (ref 22–32)
CO2: 25 mmol/L (ref 22–32)
Calcium: 9 mg/dL (ref 8.9–10.3)
Calcium: 8.7 mg/dL — ABNORMAL LOW (ref 8.9–10.3)
Chloride: 107 mmol/L (ref 98–111)
Chloride: 108 mmol/L (ref 98–111)
Creatinine, Ser: 0.82 mg/dL (ref 0.61–1.24)
Creatinine, Ser: 0.69 mg/dL (ref 0.61–1.24)
GFR, Estimated: >60 mL/min (ref >60)
GFR, Estimated: >60 mL/min (ref >60)
Glucose, Bld: 86 mg/dL (ref 70–99)
Glucose, Bld: 96 mg/dL (ref 70–99)
Potassium: 3.7 mmol/L (ref 3.5–5.1)
Potassium: 3.1 mmol/L — ABNORMAL LOW (ref 3.5–5.1)
Sodium: 142 mmol/L (ref 135–145)
Sodium: 141 mmol/L (ref 135–145)

## 2023-08-18 LAB — CBC
HCT: 36.9 % — ABNORMAL LOW (ref 39.0–52.0)
Hemoglobin: 11.2 g/dL — ABNORMAL LOW (ref 13.0–17.0)
MCH: 25.5 pg — ABNORMAL LOW (ref 26.0–34.0)
MCHC: 30.4 g/dL (ref 30.0–36.0)
MCV: 84.1 fL (ref 80.0–100.0)
Platelets: 235 10*3/uL (ref 150–400)
RBC: 4.39 MIL/uL (ref 4.22–5.81)
RDW: 15.4 % (ref 11.5–15.5)
WBC: 5.8 10*3/uL (ref 4.0–10.5)
nRBC: 0 % (ref 0.0–0.2)

## 2023-08-18 LAB — MRSA NEXT GEN BY PCR, NASAL: MRSA by PCR Next Gen: NOT DETECTED

## 2023-08-18 LAB — MAGNESIUM: Magnesium: 2.3 mg/dL (ref 1.7–2.4)

## 2023-08-18 MED ORDER — ENOXAPARIN SODIUM 80 MG/0.8ML IJ SOSY
80.0000 mg | PREFILLED_SYRINGE | INTRAMUSCULAR | Status: DC
  Administered 2023-08-18 – 2023-08-19 (×2): 80 mg via SUBCUTANEOUS
  Filled 2023-08-18 (×2): qty 0.8

## 2023-08-18 MED ORDER — SORBITOL 70 % SOLN
400.0000 mL | TOPICAL_OIL | Freq: Once | ORAL | Status: AC
  Administered 2023-08-18: 400 mL via RECTAL
  Filled 2023-08-18: qty 120

## 2023-08-18 MED ORDER — DEXTROSE-SODIUM CHLORIDE 5-0.45 % IV SOLN: INTRAVENOUS | Status: DC

## 2023-08-18 MED ORDER — POTASSIUM CHLORIDE CRYS ER 20 MEQ PO TBCR
40.0000 meq | EXTENDED_RELEASE_TABLET | Freq: Two times a day (BID) | ORAL | Status: DC
  Administered 2023-08-18: 40 meq via ORAL
  Filled 2023-08-18: qty 2

## 2023-08-18 NOTE — Plan of Care (Signed)
  Problem: Education: Goal: Knowledge of General Education information will improve Description: Including pain rating scale, medication(s)/side effects and non-pharmacologic comfort measures Outcome: Progressing   Problem: Clinical Measurements: Goal: Respiratory complications will improve Outcome: Progressing   

## 2023-08-18 NOTE — Progress Notes (Addendum)
[facility] Progress Note  CC:  Chronic colonic ileus   Subjective: He denies having any nausea or vomiting. No abdominal pain. He stated his abdominal distension is about the same today when compared to yesterday. He remains NPO. He passed a total of 4 bowel movements since the attempted flex sig yesterday, last 2 BMs were this morning, first BM was large and soft, second BM was smaller. No bloody or black stools.    Objective:  Vital signs in last 24 hours: Temp:  [97.2 F (36.2 C)-98 F (36.7 C)] 98 F (36.7 C) (11/04 0800) Pulse Rate:  [56-79] 73 (11/04 0900) Resp:  [9-24] 18 (11/04 0900) BP: (108-175)/(55-99) 168/89 (11/04 0900) SpO2:  [96 %-100 %] 98 % (11/04 0900) Last BM Date : 08/18/23 General: Morbidly obese male in NAD. Heart: RRR, distant S1,S2, no murmurs. Pulm: Breath sounds clear throughout.  Abdomen: Grossly distended, nearly tense, nontender. Scattered tinkering bowel sounds to all 4 quadrants. Rectal: No external hemorrhoids. Rectum filled with mushy golden brown stool, no fecal impaction. RN at the bedside at time of exam. Extremities: -Bilateral LE edema.  Neurologic:  Alert and  oriented x 4. Speech is clear. Able to assist with turning from side to side. Reduced strength to bilateral lower extremities.  Psych:  Alert and cooperative. Normal mood and affect.  Intake/Output from previous day: 11/03 0701 - 11/04 0700 In: -  Out: 325 [Urine:325] Intake/Output this shift: No intake/output data recorded.  Lab Results: Recent Labs    08/17/23 1030 08/18/23 0252  WBC 5.1 5.8  HGB 11.7* 11.2*  HCT 37.5* 36.9*  PLT 236 235   BMET Recent Labs    08/17/23 1030 08/18/23 0252  NA 143 142  K 3.7 3.7  CL 106 107  CO2 26 26  GLUCOSE 104* 86  BUN 23 20  CREATININE 0.82 0.82  CALCIUM 9.4 9.0   LFT Recent Labs    08/17/23 2039  PROT 8.0  ALBUMIN 3.7  AST 19  ALT 14  ALKPHOS 125  BILITOT 0.6  BILIDIR 0.1  IBILI 0.5    PT/INR No results for input(s): "LABPROT", "INR" in the last 72 hours. Hepatitis Panel No results for input(s): "HEPBSAG", "HCVAB", "HEPAIGM", "HEPBIGM" in the last 72 hours.  DG Abd 1 View  Result Date: 08/17/2023 CLINICAL DATA:  Measures cecal diameter EXAM: ABDOMEN - 1 VIEW COMPARISON:  CT 08/17/2023, radiograph 05/02/2023, 05/03/2023 FINDINGS: Excreted contrast within the left renal collecting system with mild hydronephrosis. Contrast in the urinary bladder. Right-sided ureteral stent. Considerable gaseous dilatation of the bowel, air distended colon measuring up to 10.5 cm in the left upper quadrant. Estimated cecal diameter of 8.1 cm. IMPRESSION: 1. Considerable gaseous dilatation of the bowel, with distended colon measuring up to 10.5 cm in the left upper quadrant. 2. Excreted contrast within the left renal collecting system with mild hydronephrosis. Right-sided ureteral stent. Electronically Signed   By: Jasmine Pang M.D.   On: 08/17/2023 17:51   CT ABDOMEN PELVIS W CONTRAST  Result Date: 08/17/2023 CLINICAL DATA:  Abdominal pain. EXAM: CT ABDOMEN AND PELVIS WITH CONTRAST TECHNIQUE: Multidetector CT imaging of the abdomen and pelvis was performed using the standard protocol following bolus administration of intravenous contrast. RADIATION DOSE REDUCTION: This exam was performed according to the departmental dose-optimization program which includes automated exposure control, adjustment of the mA and/or kV according to patient size and/or use of iterative reconstruction technique. CONTRAST:  OMNIPAQUE IOHEXOL 300 MG/ML  SOLN COMPARISON:  CT abdomen pelvis dated 04/29/2023. FINDINGS: Evaluation is limited due to body habitus. Lower chest: The visualized lung bases are clear. No intra-abdominal free air or free fluid. Hepatobiliary: The liver is unremarkable. There is mild biliary dilatation, post cholecystectomy. No retained calcified stone noted in the central CBD. Pancreas:  Unremarkable. No pancreatic ductal dilatation or surrounding inflammatory changes. Spleen: Normal in size without focal abnormality. Adrenals/Urinary Tract: The left adrenal gland is unremarkable. There is nodular thickening of the medial limb of the right adrenal gland similar to prior CT. Multiple nonobstructing bilateral renal calculi measuring up to 15 mm in the inferior pole of the right kidney and 16 mm in the inferior pole of the left kidney. There has been interval placement of a pigtail right ureteral stent with proximal tip in the right renal pelvis and distal end in the urinary bladder. The previously seen right ureteral calculus is no longer visualized. Interval improvement in the right-sided hydronephrosis since the prior CT. Mild residual right hydronephrosis remains. There is thickened appearance of the urothelium of the right renal collecting system and ureter which may represent chronic UTI. Correlation with urinalysis recommended. There is mild left hydronephrosis, new since the prior CT. No stone identified in the left ureter. The urinary bladder is decompressed around a Foley catheter. The previously seen stone within the bladder is not visualized on today's exam and has likely passed. Stomach/Bowel: Postsurgical changes of gastric sleeve. There is progressive diffuse air distention of the colon without mechanical obstruction consistent with colonic ileus. No evidence of small-bowel obstruction. The appendix is normal. Vascular/Lymphatic: Mild aortoiliac atherosclerotic disease. The IVC is unremarkable. No portal venous gas. There is no adenopathy. Reproductive: The prostate is grossly unremarkable.  No pelvic mass. Other: None Musculoskeletal: Degenerative changes of the spine. No acute osseous pathology. IMPRESSION: 1. Interval placement of a pigtail right ureteral stent with interval improvement in the right-sided hydronephrosis since the prior CT. Mild residual right hydronephrosis remains.  2. The right ureteral calculus as well as the calculus in the urinary bladder seen on the prior CT are no longer present. 3. Multiple nonobstructing bilateral renal calculi. 4. Mild left hydronephrosis, new since the prior CT. No stone identified in the left ureter. 5. Progressive colonic ileus. No mechanical obstruction. Normal appendix. 6.  Aortic Atherosclerosis (ICD10-I70.0). Electronically Signed   By: Elgie Collard M.D.   On: 08/17/2023 15:42     Brief Narrative: Patient is a 67 year old male with a chronic colonic ileus, neurogenic bladder chronic indwelling Foley catheter, spinal stenosis, and renal stones admitted 08/17/2023 with worsening abdominal distension.   Assessment / Plan:  Acute exacerbation of chronic colonic ileus with worsening abdominal distension and nausea s/p cystoscope with right ureteroscopy for stone extraction and stenting under general anesthesia on 08/12/2023. CTAP 11/3 showed progressive colonic ileus without obstruction. Evaluated by general surgery who recommended conservative management without surgical intervention.  A flexible sigmoidoscopy for decompression was attempted by Dr. Elnoria Howard 11/3 but the scope was unable to pass beyond the rectum secondary to significant stool burden. Magnesium 2.3.  Potassium 3.7. -Keep K+ > 4 < 5 and Magnesium 2 - 2.5 -Patient is NPO, defer IV fluids to the hospitalist  -Portable abdominal xray -Consider re-attempt flexible sigmoidoscopy for decompression, await further recommendations per Dr. Marina Goodell -SMOG enema x 1 now -? Relistor  -Turn patient Q 2 hours  S/P cystoscope with right ureteroscopy for stone extraction and stenting 08/12/2023. and stenting under general anesthesia. CTAP showed Interval placement of a  pigtail right ureteral stent with interval improvement in the right-sided hydronephrosis since the prior CT with mild residual right hydronephrosis remains, multiple nonobstructing bilateral renal calculi and mild left  hydronephrosis, new since the prior CT. -Follow up with urology  Chronic anemia.  Stable hemoglobin 11.2.  MCV 84.1. No overt GI bleeding.   Principal Problem:   Ileus (HCC)     LOS: 1 day   Arnaldo Natal  08/18/2023, 12:23 PM  GI ATTENDING  Interval history data reviewed.  Patient seen and examined.  Agree with progress note.  Patient states he feels about the same.  No abdominal pain.  Marked distention of the abdomen noted.  Follow x-rays completed but pending reading.  Stool and rectal exam.  Flexible sigmoidoscopy aborted yesterday due to stool in colon.  Agree with plans for cleansing enemas.  Agree with other recommendations as outlined above.  May need attempted decompressive sigmoidoscopy if does not respond to conservative measures.  Wilhemina Bonito. Eda Keys., M.D. [facility]

## 2023-08-18 NOTE — Progress Notes (Signed)
PROGRESS NOTE    [name] [name]  NWG:[id number] DOB: [birth year] DOA: 08/17/2023 PCP: Renford Dills, MD    Brief Narrative:  67 year old with obesity, chronic indwelling Foley catheter, immobility secondary to spinal stenosis, recurrent constipation who had cystoscopy and stenting done on 10/29, went home and started abdominal distention which is progressively worse so came to the ER.  He had mild hematuria on arrival, he had abdominal distention and significant constipation.  Surgery and GI were consulted and he was admitted to the hospital. Attempted flexible sigmoidoscopy, unable to pass the scope due to hard stool.  Subjective: Patient seen and examined.  Denies any nausea vomiting.  Patient had 2 bowel movement overnight there were firm and small amount.  His abdomen is still significantly distended but he does not feel much pain.  Catheter with clear urine.   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  Probably aggravated by recent surgery.  Currently on maintenance fluid.  NPO. Surgery were consulted, they recommended endoscopic decompression. Colonoscopy attempted 11/3, unable to break the stool or pass through the stool. May attempt a repeat colonoscopy/flexible sigmoidoscopy.  Discussed with GI for follow-up.  Hematuria, anticipated after stenting and catheter exchange.  Chronic indwelling Foley catheter due to neurogenic bladder.  Currently stable.  Hypertension: Blood pressure stable on amlodipine.  Anemia of chronic disease: Fairly stable.   DVT prophylaxis: enoxaparin (LOVENOX) injection 40 mg Start: 08/17/23 2200 SCDs Start: 08/17/23 1656   Code Status: Full code Family Communication: None at the bedside Disposition Plan: Status is: Inpatient Remains inpatient appropriate because: Inpatient treatment, significant abdominal distention.  Will need inpatient treatment.     Consultants:  General surgery GI  Procedures:   None  Antimicrobials:  None     Objective: Vitals:   08/18/23 0729 08/18/23 0800 08/18/23 0815 08/18/23 0900  BP: (!) 158/88  (!) 165/99 (!) 168/89  Pulse: 78 76 79 73  Resp: (!) 24 18 16 18   Temp:  98 F (36.7 C)    TempSrc:  Oral    SpO2: 100% 99% 96% 98%  Weight:      Height:        Intake/Output Summary (Last 24 hours) at 08/18/2023 1058 Last data filed at 08/18/2023 0600 Gross per 24 hour  Intake --  Output 325 ml  Net -325 ml   Filed Weights   08/17/23 1007  Weight: (!) 166 kg    Examination:  General exam: Appears calm and comfortable  Respiratory system: Clear to auscultation. Respiratory effort normal. Cardiovascular system: S1 & S2 heard, RRR.  No pedal edema. Gastrointestinal system: Largely distended.  Soft.  Bowel sound present. Foley catheter with clear urine. Central nervous system: Alert and oriented.  Both lower extremities are weak. Skin: No rashes, lesions or ulcers   Data Reviewed: I have personally reviewed following labs and imaging studies  CBC: Recent Labs  Lab 08/17/23 1030 08/18/23 0252  WBC 5.1 5.8  HGB 11.7* 11.2*  HCT 37.5* 36.9*  MCV 82.8 84.1  PLT 236 235   Basic Metabolic Panel: Recent Labs  Lab 08/17/23 1030 08/18/23 0252  NA 143 142  K 3.7 3.7  CL 106 107  CO2 26 26  GLUCOSE 104* 86  BUN 23 20  CREATININE 0.82 0.82  CALCIUM 9.4 9.0  MG  --  2.3   GFR: Estimated Creatinine Clearance: 140 mL/min (by C-G formula based on SCr of 0.82 mg/dL). Liver Function Tests: Recent Labs  Lab 08/17/23 2039  AST 19  ALT 14  ALKPHOS 125  BILITOT 0.6  PROT 8.0  ALBUMIN 3.7   Recent Labs  Lab 08/17/23 2039  LIPASE 34   No results for input(s): "AMMONIA" in the last 168 hours. Coagulation Profile: No results for input(s): "INR", "PROTIME" in the last 168 hours. Cardiac Enzymes: No results for input(s): "CKTOTAL", "CKMB", "CKMBINDEX", "TROPONINI" in the last 168 hours. BNP (last 3 results) No results for  input(s): "PROBNP" in the last 8760 hours. HbA1C: No results for input(s): "HGBA1C" in the last 72 hours. CBG: No results for input(s): "GLUCAP" in the last 168 hours. Lipid Profile: No results for input(s): "CHOL", "HDL", "LDLCALC", "TRIG", "CHOLHDL", "LDLDIRECT" in the last 72 hours. Thyroid Function Tests: No results for input(s): "TSH", "T4TOTAL", "FREET4", "T3FREE", "THYROIDAB" in the last 72 hours. Anemia Panel: No results for input(s): "VITAMINB12", "FOLATE", "FERRITIN", "TIBC", "IRON", "RETICCTPCT" in the last 72 hours. Sepsis Labs: No results for input(s): "PROCALCITON", "LATICACIDVEN" in the last 168 hours.  No results found for this or any previous visit (from the past 240 hour(s)).       Radiology Studies: DG Abd 1 View  Result Date: 08/17/2023 CLINICAL DATA:  Measures cecal diameter EXAM: ABDOMEN - 1 VIEW COMPARISON:  CT 08/17/2023, radiograph 05/02/2023, 05/03/2023 FINDINGS: Excreted contrast within the left renal collecting system with mild hydronephrosis. Contrast in the urinary bladder. Right-sided ureteral stent. Considerable gaseous dilatation of the bowel, air distended colon measuring up to 10.5 cm in the left upper quadrant. Estimated cecal diameter of 8.1 cm. IMPRESSION: 1. Considerable gaseous dilatation of the bowel, with distended colon measuring up to 10.5 cm in the left upper quadrant. 2. Excreted contrast within the left renal collecting system with mild hydronephrosis. Right-sided ureteral stent. Electronically Signed   By: Jasmine Pang M.D.   On: 08/17/2023 17:51   CT ABDOMEN PELVIS W CONTRAST  Result Date: 08/17/2023 CLINICAL DATA:  Abdominal pain. EXAM: CT ABDOMEN AND PELVIS WITH CONTRAST TECHNIQUE: Multidetector CT imaging of the abdomen and pelvis was performed using the standard protocol following bolus administration of intravenous contrast. RADIATION DOSE REDUCTION: This exam was performed according to the departmental dose-optimization program which  includes automated exposure control, adjustment of the mA and/or kV according to patient size and/or use of iterative reconstruction technique. CONTRAST:  OMNIPAQUE IOHEXOL 300 MG/ML  SOLN COMPARISON:  CT abdomen pelvis dated 04/29/2023. FINDINGS: Evaluation is limited due to body habitus. Lower chest: The visualized lung bases are clear. No intra-abdominal free air or free fluid. Hepatobiliary: The liver is unremarkable. There is mild biliary dilatation, post cholecystectomy. No retained calcified stone noted in the central CBD. Pancreas: Unremarkable. No pancreatic ductal dilatation or surrounding inflammatory changes. Spleen: Normal in size without focal abnormality. Adrenals/Urinary Tract: The left adrenal gland is unremarkable. There is nodular thickening of the medial limb of the right adrenal gland similar to prior CT. Multiple nonobstructing bilateral renal calculi measuring up to 15 mm in the inferior pole of the right kidney and 16 mm in the inferior pole of the left kidney. There has been interval placement of a pigtail right ureteral stent with proximal tip in the right renal pelvis and distal end in the urinary bladder. The previously seen right ureteral calculus is no longer visualized. Interval improvement in the right-sided hydronephrosis since the prior CT. Mild residual right hydronephrosis remains. There is thickened appearance of the urothelium of the right renal collecting system and ureter which may represent chronic UTI. Correlation with urinalysis recommended. There is  mild left hydronephrosis, new since the prior CT. No stone identified in the left ureter. The urinary bladder is decompressed around a Foley catheter. The previously seen stone within the bladder is not visualized on today's exam and has likely passed. Stomach/Bowel: Postsurgical changes of gastric sleeve. There is progressive diffuse air distention of the colon without mechanical obstruction consistent with colonic  ileus. No evidence of small-bowel obstruction. The appendix is normal. Vascular/Lymphatic: Mild aortoiliac atherosclerotic disease. The IVC is unremarkable. No portal venous gas. There is no adenopathy. Reproductive: The prostate is grossly unremarkable.  No pelvic mass. Other: None Musculoskeletal: Degenerative changes of the spine. No acute osseous pathology. IMPRESSION: 1. Interval placement of a pigtail right ureteral stent with interval improvement in the right-sided hydronephrosis since the prior CT. Mild residual right hydronephrosis remains. 2. The right ureteral calculus as well as the calculus in the urinary bladder seen on the prior CT are no longer present. 3. Multiple nonobstructing bilateral renal calculi. 4. Mild left hydronephrosis, new since the prior CT. No stone identified in the left ureter. 5. Progressive colonic ileus. No mechanical obstruction. Normal appendix. 6.  Aortic Atherosclerosis (ICD10-I70.0). Electronically Signed   By: Elgie Collard M.D.   On: 08/17/2023 15:42        Scheduled Meds:  amLODipine  5 mg Oral Daily   Chlorhexidine Gluconate Cloth  6 each Topical Daily   enoxaparin (LOVENOX) injection  40 mg Subcutaneous Q24H   Continuous Infusions:   LOS: 1 day    Time spent: 35 minutes    Dorcas Carrow, MD [facility]

## 2023-08-18 NOTE — Procedures (Signed)
Foley catheter and tethered ureteral stent were removed at bedside at the request of Dr. Annabell Howells.  Stent came out in its entirety.  Foley catheter will be replaced with another 63f.  This will need to be exchanged in 30 days.

## 2023-08-19 DIAGNOSIS — R935 Abnormal findings on diagnostic imaging of other abdominal regions, including retroperitoneum: Secondary | ICD-10-CM | POA: Diagnosis not present

## 2023-08-19 DIAGNOSIS — K567 Ileus, unspecified: Secondary | ICD-10-CM

## 2023-08-19 DIAGNOSIS — K9189 Other postprocedural complications and disorders of digestive system: Secondary | ICD-10-CM | POA: Diagnosis not present

## 2023-08-19 DIAGNOSIS — R140 Abdominal distension (gaseous): Secondary | ICD-10-CM | POA: Diagnosis not present

## 2023-08-19 LAB — IRON AND TIBC
Iron: 25 ug/dL — ABNORMAL LOW (ref 45–182)
Saturation Ratios: 9 % — ABNORMAL LOW (ref 17.9–39.5)
TIBC: 272 ug/dL (ref 250–450)
UIBC: 247 ug/dL

## 2023-08-19 LAB — CBC
HCT: 32.7 % — ABNORMAL LOW (ref 39.0–52.0)
Hemoglobin: 10.4 g/dL — ABNORMAL LOW (ref 13.0–17.0)
MCH: 26 pg (ref 26.0–34.0)
MCHC: 31.8 g/dL (ref 30.0–36.0)
MCV: 81.8 fL (ref 80.0–100.0)
Platelets: 191 10*3/uL (ref 150–400)
RBC: 4 MIL/uL — ABNORMAL LOW (ref 4.22–5.81)
RDW: 15.4 % (ref 11.5–15.5)
WBC: 5.9 10*3/uL (ref 4.0–10.5)
nRBC: 0 % (ref 0.0–0.2)

## 2023-08-19 LAB — OCCULT BLOOD X 1 CARD TO LAB, STOOL: Fecal Occult Bld: NEGATIVE

## 2023-08-19 LAB — BASIC METABOLIC PANEL
Anion gap: 7 (ref 5–15)
BUN: 13 mg/dL (ref 8–23)
CO2: 26 mmol/L (ref 22–32)
Calcium: 8.3 mg/dL — ABNORMAL LOW (ref 8.9–10.3)
Chloride: 107 mmol/L (ref 98–111)
Creatinine, Ser: 0.63 mg/dL (ref 0.61–1.24)
GFR, Estimated: >60 mL/min (ref >60)
Glucose, Bld: 98 mg/dL (ref 70–99)
Potassium: 3.2 mmol/L — ABNORMAL LOW (ref 3.5–5.1)
Sodium: 140 mmol/L (ref 135–145)

## 2023-08-19 LAB — VITAMIN B12: Vitamin B-12: 267 pg/mL (ref 180–914)

## 2023-08-19 LAB — FERRITIN: Ferritin: 55 ng/mL (ref 24–336)

## 2023-08-19 MED ORDER — HYDRALAZINE HCL 25 MG PO TABS
25.0000 mg | ORAL_TABLET | Freq: Four times a day (QID) | ORAL | Status: DC | PRN
  Administered 2023-08-20: 25 mg via ORAL
  Filled 2023-08-19: qty 1

## 2023-08-19 MED ORDER — POTASSIUM CHLORIDE CRYS ER 20 MEQ PO TBCR
40.0000 meq | EXTENDED_RELEASE_TABLET | Freq: Two times a day (BID) | ORAL | Status: DC
  Administered 2023-08-19: 40 meq via ORAL
  Filled 2023-08-19: qty 2

## 2023-08-19 MED ORDER — AMLODIPINE BESYLATE 5 MG PO TABS
5.0000 mg | ORAL_TABLET | Freq: Every day | ORAL | Status: DC
Start: 2023-08-19 — End: 2023-08-20
  Administered 2023-08-19 – 2023-08-20 (×2): 5 mg via ORAL
  Filled 2023-08-19 (×2): qty 1

## 2023-08-19 MED ORDER — DEXTROSE-SODIUM CHLORIDE 5-0.45 % IV SOLN: INTRAVENOUS | Status: DC

## 2023-08-19 MED ORDER — POLYETHYLENE GLYCOL 3350 17 G PO PACK
17.0000 g | PACK | Freq: Two times a day (BID) | ORAL | Status: DC
Start: 2023-08-19 — End: 2023-08-20
  Administered 2023-08-19 (×2): 17 g via ORAL
  Filled 2023-08-19 (×2): qty 1

## 2023-08-19 MED ORDER — POTASSIUM CHLORIDE CRYS ER 20 MEQ PO TBCR
40.0000 meq | EXTENDED_RELEASE_TABLET | Freq: Three times a day (TID) | ORAL | Status: AC
  Administered 2023-08-19 (×3): 40 meq via ORAL
  Filled 2023-08-19 (×3): qty 2

## 2023-08-19 NOTE — Progress Notes (Signed)
PROGRESS NOTE    Tayshaun Kroh  UJW:[id number] DOB: 1955/11/10 DOA: 08/17/2023 PCP: Renford Dills, MD    Brief Narrative:  67 year old with obesity, chronic indwelling Foley catheter, immobility secondary to spinal stenosis, recurrent constipation who had cystoscopy and stenting done on 10/29, went home and started abdominal distention which is progressively worse so came to the ER.  He had mild hematuria on arrival, he had abdominal distention and significant constipation.  Surgery and GI were consulted and he was admitted to the hospital. Attempted flexible sigmoidoscopy, unable to pass the scope due to hard stool.  Patient was subsequently treated with smog enema with some improvement of bowel functions.  Subjective:  Patient seen and examined.  His abdomen is still distended but much better than yesterday.  He had multiple episodes of mostly softer stool after enema yesterday.  He is incontinent that is his chronic issue. He feels less distended.  He thinks he can avoid colonoscopy.  Assessment & Plan:   Ogilvie syndrome/abdominal distention and constipation: Recurrent issue.  Maintenance IV fluid.  He had some response to soapsuds enema.  Some clinical improvement today.  GI following. Started on full liquid diet. More aggressive bowel regimen today.  Hopefully he will avoid colonoscopy.  Hematuria, anticipated after stenting and catheter exchange.  Chronic indwelling Foley catheter due to neurogenic bladder.  Currently stable. Foley catheter exchanged, stent removed by urology.  Hypertension: Blood pressure stable on amlodipine.  Anemia of chronic disease: Fairly stable.  Hypokalemia: Replace and monitor.   DVT prophylaxis: SCDs Start: 08/17/23 1656   Code Status: Full code Family Communication: None at the bedside Disposition Plan: Status is: Inpatient Remains inpatient appropriate because: Inpatient treatment, significant abdominal distention.  Will need inpatient  treatment.     Consultants:  General surgery GI  Procedures:  None  Antimicrobials:  None     Objective: Vitals:   08/19/23 0600 08/19/23 0700 08/19/23 0800 08/19/23 0825  BP: (!) 164/66 (!) 168/70 (!) 171/134   Pulse: (!) 57 (!) 53  60  Resp: 11 13 13 14   Temp:   97.6 F (36.4 C)   TempSrc:   Oral   SpO2: 99% 100% 100% 98%  Weight:      Height:        Intake/Output Summary (Last 24 hours) at 08/19/2023 1153 Last data filed at 08/19/2023 0800 Gross per 24 hour  Intake 1887.95 ml  Output 1425 ml  Net 462.95 ml   Filed Weights   08/17/23 1007  Weight: (!) 166 kg    Examination:  General exam: Appears calm and comfortable.  Pleasant to conversation. Respiratory system: Clear to auscultation. Respiratory effort normal. Cardiovascular system: S1 & S2 heard, RRR.  No pedal edema. Gastrointestinal system: Distended but soft..  Soft.  Bowel sound present. Foley catheter with pink-tinged urine. Central nervous system: Alert and oriented.  Both lower extremities are weak. Skin: No rashes, lesions or ulcers   Data Reviewed: I have personally reviewed following labs and imaging studies  CBC: Recent Labs  Lab 08/17/23 1030 08/18/23 0252 08/19/23 0254  WBC 5.1 5.8 5.9  HGB 11.7* 11.2* 10.4*  HCT 37.5* 36.9* 32.7*  MCV 82.8 84.1 81.8  PLT 236 235 191   Basic Metabolic Panel: Recent Labs  Lab 08/17/23 1030 08/18/23 0252 08/18/23 1657 08/19/23 0254  NA 143 142 141 140  K 3.7 3.7 3.1* 3.2*  CL 106 107 108 107  CO2 26 26 25 26   GLUCOSE 104* 86 96 98  BUN  23 20 16 13   CREATININE 0.82 0.82 0.69 0.63  CALCIUM 9.4 9.0 8.7* 8.3*  MG  --  2.3  --   --    GFR: Estimated Creatinine Clearance: 143.5 mL/min (by C-G formula based on SCr of 0.63 mg/dL). Liver Function Tests: Recent Labs  Lab 08/17/23 2039  AST 19  ALT 14  ALKPHOS 125  BILITOT 0.6  PROT 8.0  ALBUMIN 3.7   Recent Labs  Lab 08/17/23 2039  LIPASE 34   No results for input(s):  "AMMONIA" in the last 168 hours. Coagulation Profile: No results for input(s): "INR", "PROTIME" in the last 168 hours. Cardiac Enzymes: No results for input(s): "CKTOTAL", "CKMB", "CKMBINDEX", "TROPONINI" in the last 168 hours. BNP (last 3 results) No results for input(s): "PROBNP" in the last 8760 hours. HbA1C: No results for input(s): "HGBA1C" in the last 72 hours. CBG: No results for input(s): "GLUCAP" in the last 168 hours. Lipid Profile: No results for input(s): "CHOL", "HDL", "LDLCALC", "TRIG", "CHOLHDL", "LDLDIRECT" in the last 72 hours. Thyroid Function Tests: No results for input(s): "TSH", "T4TOTAL", "FREET4", "T3FREE", "THYROIDAB" in the last 72 hours. Anemia Panel: Recent Labs    08/19/23 0254  VITAMINB12 267  FERRITIN 55  TIBC 272  IRON 25*   Sepsis Labs: No results for input(s): "PROCALCITON", "LATICACIDVEN" in the last 168 hours.  Recent Results (from the past 240 hour(s))  Urine Culture     Status: None   Collection Time: 08/17/23 12:15 PM   Specimen: Urine, Catheterized  Result Value Ref Range Status   Specimen Description   Final    URINE, CATHETERIZED Performed at [facility], [address], [place], [place] [address]    Special Requests   Final    NONE Performed at [facility], [address] [place], [place] [address]    Culture   Final    NO GROWTH Performed at [facility], [address] [place], [place] [address]    Report Status 08/18/2023 FINAL  Final  MRSA Next Gen by PCR, Nasal     Status: None   Collection Time: 08/18/23 11:20 AM   Specimen: Nasal Mucosa; Nasal Swab  Result Value Ref Range Status   MRSA by PCR Next Gen NOT DETECTED NOT DETECTED Final    Comment: (NOTE) The GeneXpert MRSA Assay (FDA approved for NASAL specimens only), is one component of a comprehensive MRSA colonization surveillance program. It is not intended to diagnose MRSA infection nor to guide or monitor  treatment for MRSA infections. Test performance is not FDA approved in patients less than 9 years old. Performed at [facility], [address] [place], [place] [address]          Radiology Studies: DG Abd Portable 1V  Result Date: 08/18/2023 CLINICAL DATA:  Colonic ileus.  Abdominal distension. EXAM: PORTABLE ABDOMEN - 1 VIEW COMPARISON:  CT and radiographs yesterday. FINDINGS: There is marked diffuse colonic distension. No significant change from prior exam. Right-sided nephroureteral stent again seen. The stent extends inferiorly and is likely in the prostatic urethra on CT. Known bilateral renal stones are not well delineated on the current exam. Cholecystectomy changes in the right upper quadrant. IMPRESSION: 1. Marked diffuse colonic distension, unchanged from prior exam. 2. Right-sided nephroureteral stent again seen. The stent extends inferiorly and is likely in the prostatic urethra on CT. Electronically Signed   By: Narda Rutherford M.D.   On: 08/18/2023 15:13   DG Abd 1  View  Result Date: 08/17/2023 CLINICAL DATA:  Measures cecal diameter EXAM: ABDOMEN - 1 VIEW COMPARISON:  CT 08/17/2023, radiograph 05/02/2023, 05/03/2023 FINDINGS: Excreted contrast within the left renal collecting system with mild hydronephrosis. Contrast in the urinary bladder. Right-sided ureteral stent. Considerable gaseous dilatation of the bowel, air distended colon measuring up to 10.5 cm in the left upper quadrant. Estimated cecal diameter of 8.1 cm. IMPRESSION: 1. Considerable gaseous dilatation of the bowel, with distended colon measuring up to 10.5 cm in the left upper quadrant. 2. Excreted contrast within the left renal collecting system with mild hydronephrosis. Right-sided ureteral stent. Electronically Signed   By: Jasmine Pang M.D.   On: 08/17/2023 17:51   CT ABDOMEN PELVIS W CONTRAST  Result Date: 08/17/2023 CLINICAL DATA:  Abdominal pain. EXAM: CT ABDOMEN AND PELVIS WITH  CONTRAST TECHNIQUE: Multidetector CT imaging of the abdomen and pelvis was performed using the standard protocol following bolus administration of intravenous contrast. RADIATION DOSE REDUCTION: This exam was performed according to the departmental dose-optimization program which includes automated exposure control, adjustment of the mA and/or kV according to patient size and/or use of iterative reconstruction technique. CONTRAST:  OMNIPAQUE IOHEXOL 300 MG/ML  SOLN COMPARISON:  CT abdomen pelvis dated 04/29/2023. FINDINGS: Evaluation is limited due to body habitus. Lower chest: The visualized lung bases are clear. No intra-abdominal free air or free fluid. Hepatobiliary: The liver is unremarkable. There is mild biliary dilatation, post cholecystectomy. No retained calcified stone noted in the central CBD. Pancreas: Unremarkable. No pancreatic ductal dilatation or surrounding inflammatory changes. Spleen: Normal in size without focal abnormality. Adrenals/Urinary Tract: The left adrenal gland is unremarkable. There is nodular thickening of the medial limb of the right adrenal gland similar to prior CT. Multiple nonobstructing bilateral renal calculi measuring up to 15 mm in the inferior pole of the right kidney and 16 mm in the inferior pole of the left kidney. There has been interval placement of a pigtail right ureteral stent with proximal tip in the right renal pelvis and distal end in the urinary bladder. The previously seen right ureteral calculus is no longer visualized. Interval improvement in the right-sided hydronephrosis since the prior CT. Mild residual right hydronephrosis remains. There is thickened appearance of the urothelium of the right renal collecting system and ureter which may represent chronic UTI. Correlation with urinalysis recommended. There is mild left hydronephrosis, new since the prior CT. No stone identified in the left ureter. The urinary bladder is decompressed around a Foley  catheter. The previously seen stone within the bladder is not visualized on today's exam and has likely passed. Stomach/Bowel: Postsurgical changes of gastric sleeve. There is progressive diffuse air distention of the colon without mechanical obstruction consistent with colonic ileus. No evidence of small-bowel obstruction. The appendix is normal. Vascular/Lymphatic: Mild aortoiliac atherosclerotic disease. The IVC is unremarkable. No portal venous gas. There is no adenopathy. Reproductive: The prostate is grossly unremarkable.  No pelvic mass. Other: None Musculoskeletal: Degenerative changes of the spine. No acute osseous pathology. IMPRESSION: 1. Interval placement of a pigtail right ureteral stent with interval improvement in the right-sided hydronephrosis since the prior CT. Mild residual right hydronephrosis remains. 2. The right ureteral calculus as well as the calculus in the urinary bladder seen on the prior CT are no longer present. 3. Multiple nonobstructing bilateral renal calculi. 4. Mild left hydronephrosis, new since the prior CT. No stone identified in the left ureter. 5. Progressive colonic ileus. No mechanical obstruction. Normal appendix. 6.  Aortic  Atherosclerosis (ICD10-I70.0). Electronically Signed   By: Elgie Collard M.D.   On: 08/17/2023 15:42        Scheduled Meds:  amLODipine  5 mg Oral Daily   Chlorhexidine Gluconate Cloth  6 each Topical Daily   enoxaparin (LOVENOX) injection  80 mg Subcutaneous Q24H   polyethylene glycol  17 g Oral BID   potassium chloride  40 mEq Oral TID   Continuous Infusions:  dextrose 5 % and 0.45 % NaCl 100 mL/hr at 08/19/23 0812     LOS: 2 days    Time spent: 35 minutes    Dorcas Carrow, MD [facility]

## 2023-08-19 NOTE — Progress Notes (Signed)
   08/19/23 1251  TOC Brief Assessment  Insurance and Status Reviewed  Patient has primary care physician Yes  Home environment has been reviewed Resides with spouse  Prior level of function: Needs assistance with ADLs at baseline  Prior/Current Home Services Current home services (Patient has an aide.)  Social Determinants of Health Reivew SDOH reviewed no interventions necessary  Readmission risk has been reviewed Yes  Transition of care needs no transition of care needs at this time

## 2023-08-19 NOTE — Plan of Care (Signed)
  Problem: Coping: Goal: Level of anxiety will decrease Outcome: Progressing   Problem: Pain Management: Goal: General experience of comfort will improve Outcome: Progressing   Problem: Safety: Goal: Ability to remain free from injury will improve Outcome: Progressing

## 2023-08-19 NOTE — Plan of Care (Signed)
  Problem: Education: Goal: Knowledge of General Education information will improve Description: Including pain rating scale, medication(s)/side effects and non-pharmacologic comfort measures Outcome: Progressing   Problem: Nutrition: Goal: Adequate nutrition will be maintained Outcome: Progressing   Problem: Elimination: Goal: Will not experience complications related to bowel motility Outcome: Progressing Goal: Will not experience complications related to urinary retention Outcome: Progressing   

## 2023-08-19 NOTE — Progress Notes (Addendum)
[facility] Progress Note  CC:  Chronic colonic ileus   Subjective: Patient responded well to small bowel, passing multiple huge soft to loose golden brown bowel movements overnight and this morning.  He feels his abdomen is significantly less distended.  No nausea or vomiting.  No abdominal pain.  No chest pain or shortness of breath.   Objective:  Vital signs in last 24 hours: Temp:  [97.6 F (36.4 C)-98.4 F (36.9 C)] 97.6 F (36.4 C) (11/05 0800) Pulse Rate:  [52-73] 60 (11/05 0825) Resp:  [8-24] 14 (11/05 0825) BP: (129-209)/(65-134) 171/134 (11/05 0800) SpO2:  [98 %-100 %] 98 % (11/05 0825) Last BM Date : 08/19/23 General: 67 year old male in no acute distress. Heart: Regular rate and rhythm, no murmurs. Pulm: Breath sounds clear throughout. Abdomen: Obese abdomen, gross abdominal distention is less when compared to yesterday's exam, abdomen is softer and not tense.  Hypoactive bowel sounds to all 4 quadrants.  Patient is currently sitting in a large amount of soft to loose golden brown stool. Extremities: Bilateral lower extremity edema. Neurologic:  Alert and  oriented x 4. Alert and  oriented x 4. Speech is clear. Able to assist with turning from side to side. Reduced strength to bilateral lower extremities.  Psych:  Alert and cooperative. Normal mood and affect.  Intake/Output from previous day: 11/04 0701 - 11/05 0700 In: 1794.4 [I.V.:1794.4] Out: 1425 [Urine:1425] Intake/Output this shift: No intake/output data recorded.  Lab Results: Recent Labs    08/17/23 1030 08/18/23 0252 08/19/23 0254  WBC 5.1 5.8 5.9  HGB 11.7* 11.2* 10.4*  HCT 37.5* 36.9* 32.7*  PLT 236 235 191   BMET Recent Labs    08/18/23 0252 08/18/23 1657 08/19/23 0254  NA 142 141 140  K 3.7 3.1* 3.2*  CL 107 108 107  CO2 26 25 26   GLUCOSE 86 96 98  BUN 20 16 13   CREATININE 0.82 0.69 0.63  CALCIUM 9.0 8.7* 8.3*   LFT Recent Labs    08/17/23 2039  PROT 8.0   ALBUMIN 3.7  AST 19  ALT 14  ALKPHOS 125  BILITOT 0.6  BILIDIR 0.1  IBILI 0.5   PT/INR No results for input(s): "LABPROT", "INR" in the last 72 hours. Hepatitis Panel No results for input(s): "HEPBSAG", "HCVAB", "HEPAIGM", "HEPBIGM" in the last 72 hours.  DG Abd Portable 1V  Result Date: 08/18/2023 CLINICAL DATA:  Colonic ileus.  Abdominal distension. EXAM: PORTABLE ABDOMEN - 1 VIEW COMPARISON:  CT and radiographs yesterday. FINDINGS: There is marked diffuse colonic distension. No significant change from prior exam. Right-sided nephroureteral stent again seen. The stent extends inferiorly and is likely in the prostatic urethra on CT. Known bilateral renal stones are not well delineated on the current exam. Cholecystectomy changes in the right upper quadrant. IMPRESSION: 1. Marked diffuse colonic distension, unchanged from prior exam. 2. Right-sided nephroureteral stent again seen. The stent extends inferiorly and is likely in the prostatic urethra on CT. Electronically Signed   By: Narda Rutherford M.D.   On: 08/18/2023 15:13   DG Abd 1 View  Result Date: 08/17/2023 CLINICAL DATA:  Measures cecal diameter EXAM: ABDOMEN - 1 VIEW COMPARISON:  CT 08/17/2023, radiograph 05/02/2023, 05/03/2023 FINDINGS: Excreted contrast within the left renal collecting system with mild hydronephrosis. Contrast in the urinary bladder. Right-sided ureteral stent. Considerable gaseous dilatation of the bowel, air distended colon measuring up to 10.5 cm in the left upper quadrant. Estimated cecal diameter of 8.1 cm. IMPRESSION: 1.  Considerable gaseous dilatation of the bowel, with distended colon measuring up to 10.5 cm in the left upper quadrant. 2. Excreted contrast within the left renal collecting system with mild hydronephrosis. Right-sided ureteral stent. Electronically Signed   By: Jasmine Pang M.D.   On: 08/17/2023 17:51   CT ABDOMEN PELVIS W CONTRAST  Result Date: 08/17/2023 CLINICAL DATA:  Abdominal pain.  EXAM: CT ABDOMEN AND PELVIS WITH CONTRAST TECHNIQUE: Multidetector CT imaging of the abdomen and pelvis was performed using the standard protocol following bolus administration of intravenous contrast. RADIATION DOSE REDUCTION: This exam was performed according to the departmental dose-optimization program which includes automated exposure control, adjustment of the mA and/or kV according to patient size and/or use of iterative reconstruction technique. CONTRAST:  OMNIPAQUE IOHEXOL 300 MG/ML  SOLN COMPARISON:  CT abdomen pelvis dated 04/29/2023. FINDINGS: Evaluation is limited due to body habitus. Lower chest: The visualized lung bases are clear. No intra-abdominal free air or free fluid. Hepatobiliary: The liver is unremarkable. There is mild biliary dilatation, post cholecystectomy. No retained calcified stone noted in the central CBD. Pancreas: Unremarkable. No pancreatic ductal dilatation or surrounding inflammatory changes. Spleen: Normal in size without focal abnormality. Adrenals/Urinary Tract: The left adrenal gland is unremarkable. There is nodular thickening of the medial limb of the right adrenal gland similar to prior CT. Multiple nonobstructing bilateral renal calculi measuring up to 15 mm in the inferior pole of the right kidney and 16 mm in the inferior pole of the left kidney. There has been interval placement of a pigtail right ureteral stent with proximal tip in the right renal pelvis and distal end in the urinary bladder. The previously seen right ureteral calculus is no longer visualized. Interval improvement in the right-sided hydronephrosis since the prior CT. Mild residual right hydronephrosis remains. There is thickened appearance of the urothelium of the right renal collecting system and ureter which may represent chronic UTI. Correlation with urinalysis recommended. There is mild left hydronephrosis, new since the prior CT. No stone identified in the left ureter. The urinary bladder  is decompressed around a Foley catheter. The previously seen stone within the bladder is not visualized on today's exam and has likely passed. Stomach/Bowel: Postsurgical changes of gastric sleeve. There is progressive diffuse air distention of the colon without mechanical obstruction consistent with colonic ileus. No evidence of small-bowel obstruction. The appendix is normal. Vascular/Lymphatic: Mild aortoiliac atherosclerotic disease. The IVC is unremarkable. No portal venous gas. There is no adenopathy. Reproductive: The prostate is grossly unremarkable.  No pelvic mass. Other: None Musculoskeletal: Degenerative changes of the spine. No acute osseous pathology. IMPRESSION: 1. Interval placement of a pigtail right ureteral stent with interval improvement in the right-sided hydronephrosis since the prior CT. Mild residual right hydronephrosis remains. 2. The right ureteral calculus as well as the calculus in the urinary bladder seen on the prior CT are no longer present. 3. Multiple nonobstructing bilateral renal calculi. 4. Mild left hydronephrosis, new since the prior CT. No stone identified in the left ureter. 5. Progressive colonic ileus. No mechanical obstruction. Normal appendix. 6.  Aortic Atherosclerosis (ICD10-I70.0). Electronically Signed   By: Elgie Collard M.D.   On: 08/17/2023 15:42    Assessment / Plan:  Acute exacerbation of chronic colonic ileus with worsening abdominal distension and nausea s/p cystoscope with right ureteroscopy for stone extraction and stenting under general anesthesia on 08/12/2023. CTAP 11/3 showed progressive colonic ileus without obstruction. Evaluated by general surgery who recommended conservative management without surgical intervention.  A flexible sigmoidoscopy for decompression was attempted by Dr. Elnoria Howard 11/3 but the scope was unable to pass beyond the rectum secondary to significant stool burden.  Received a smog enema yesterday which resulted in passing large  amounts of soft to loose golden brown stool overnight and this morning with decreased abdominal distention. K+ 3.2.  -Defer flex sig for decompression as patient has responded well after smog enema -Keep K+ > 4 < 5 and Magnesium 2 - 2.5 to optimize bowel motility  -Turn patient Q 2 hours -Full liquid diet -Miralax bid -Avoid narcotics  -BMP and mag level in am   S/P cystoscope with right ureteroscopy for stone extraction and stenting 08/12/2023. and stenting under general anesthesia. CTAP showed Interval placement of a pigtail right ureteral stent with interval improvement in the right-sided hydronephrosis since the prior CT with mild residual right hydronephrosis remains, multiple nonobstructing bilateral renal calculi and mild left hydronephrosis, new since the prior CT. -Follow up with urology   Chronic anemia.  Hg 11.2 -> 10.4.  MCV 84.1. Iron 25. Saturation ratio 9. Ferritin 55. B12 level 267. No overt GI bleeding.  -FOBT -Consider IV iron during this admission  Hypokalemia. K+ 3.2. -KCL replacement per the hospitalist   Hypertension.   -Management per the hospitalist    Principal Problem:   Ileus (HCC) Active Problems:   Abnormal x-ray of abdomen     LOS: 2 days   [name] [name]  08/19/2023, 9:14AM  GI ATTENDING  Interval history data reviewed.  Patient seen and examined.  Agree with interval progress note as outlined above. Abdominal exam much improved.  Having bowel movements.  Agree with full liquids.  Advance as tolerated.  MiraLAX daily.  No indication for flexible sigmoidoscopy.  Will sign off.  Wilhemina Bonito. Eda Keys., M.D. [facility]

## 2023-08-20 ENCOUNTER — Encounter (HOSPITAL_COMMUNITY): Payer: Self-pay | Admitting: Gastroenterology

## 2023-08-20 DIAGNOSIS — K567 Ileus, unspecified: Secondary | ICD-10-CM | POA: Diagnosis not present

## 2023-08-20 MED ORDER — POLYETHYLENE GLYCOL 3350 17 G PO PACK: 17.0000 g | PACK | Freq: Two times a day (BID) | ORAL | 0 refills | Status: AC

## 2023-08-20 NOTE — Plan of Care (Signed)

## 2023-08-20 NOTE — Discharge Summary (Signed)
Physician Discharge Summary  [name] [name] XBM:[id number] DOB: 1956/10/13 DOA: 08/17/2023  PCP: Renford Dills, MD  Admit date: 08/17/2023 Discharge date: 08/20/2023  Admitted From: Home Disposition: Home  Recommendations for Outpatient Follow-up:  Follow up with PCP in 1-2 weeks Please obtain BMP/CBC in one week   Home Health: Already in place Equipment/Devices: Already in place  Discharge Condition: Stable CODE STATUS: Full code Diet recommendation: Low-salt diet  Discharge summary: 67 year old with obesity, chronic indwelling Foley catheter, immobility secondary to spinal stenosis, recurrent constipation who had cystoscopy and stenting done on 10/29, went home and started abdominal distention which is progressively worse so came to the ER.  He had mild hematuria on arrival, he had abdominal distention and significant constipation.  Surgery and GI were consulted and he was admitted to the hospital. Attempted flexible sigmoidoscopy, unable to pass the scope due to hard stool.  Patient had significantly dilated colon and sigmoid colon.  He was treated with smog enema then with laxatives with improvement of symptoms.  Going home today with continuing laxative regimen.  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  Exacerbated by recent surgery.  Attempted colonoscopy, unsuccessful.  Multiple rectal enema, now on MiraLAX with improvement of symptoms.  Patient was seen by gastroenterology in the hospital.  He will continue to take MiraLAX twice daily for next few days until bowel movements are regulated then he will go back on his regular schedule of once daily.    Hematuria, anticipated after stenting and catheter exchange.  Chronic indwelling Foley catheter due to neurogenic bladder.  Currently stable. Foley catheter exchanged, stent removed by urology.  He is no more on Flomax.   Hypertension: Blood pressure stable on amlodipine.   Anemia of chronic  disease: Fairly stable.   Hypokalemia: Replaced and adequate.  Magnesium is adequate.   Medically stable to discharge home.  He has all support system, DME is available at home.   Discharge Diagnoses:  Principal Problem:   Ileus (HCC) Active Problems:   Abnormal x-ray of abdomen   Ileus, postoperative [facility])    Discharge Instructions  Discharge Instructions     Diet - low sodium heart healthy   Complete by: As directed    Increase activity slowly   Complete by: As directed    No wound care   Complete by: As directed       Allergies as of 08/20/2023       Reactions   Unasyn [ampicillin-sulbactam Sodium] Hives, Itching, Swelling, Rash        Medication List     STOP taking these medications    ciprofloxacin 500 MG tablet Commonly known as: Cipro   linezolid 600 MG tablet Commonly known as: ZYVOX   tamsulosin 0.4 MG Caps capsule Commonly known as: FLOMAX       TAKE these medications    acetaminophen 500 MG tablet Commonly known as: TYLENOL Take 1,000 mg by mouth as needed for moderate pain (pain score 4-6).   amLODipine 5 MG tablet Commonly known as: NORVASC Take 5 mg by mouth in the morning.   cetirizine 10 MG tablet Commonly known as: ZYRTEC Take 10 mg by mouth daily as needed for allergies.   FERROUS GLUCONATE PO Take 1 tablet by mouth daily.   polyethylene glycol 17 g packet Commonly known as: MIRALAX / GLYCOLAX Take 17 g by mouth 2 (two) times daily.   traMADol 50 MG tablet Commonly known as: ULTRAM Take 1 tablet (50 mg total) by mouth every 6 (  six) hours as needed for moderate pain (pain score 4-6).   VITAMIN B-12 PO Take 1 capsule by mouth daily.   VITAMIN D3 PO Take 1 tablet by mouth daily.        Allergies  Allergen Reactions   Unasyn [Ampicillin-Sulbactam Sodium] Hives, Itching, Swelling and Rash    Consultations: GI   Procedures/Studies: DG Abd Portable 1V  Result Date: 08/18/2023 CLINICAL DATA:  Colonic  ileus.  Abdominal distension. EXAM: PORTABLE ABDOMEN - 1 VIEW COMPARISON:  CT and radiographs yesterday. FINDINGS: There is marked diffuse colonic distension. No significant change from prior exam. Right-sided nephroureteral stent again seen. The stent extends inferiorly and is likely in the prostatic urethra on CT. Known bilateral renal stones are not well delineated on the current exam. Cholecystectomy changes in the right upper quadrant. IMPRESSION: 1. Marked diffuse colonic distension, unchanged from prior exam. 2. Right-sided nephroureteral stent again seen. The stent extends inferiorly and is likely in the prostatic urethra on CT. Electronically Signed   By: Narda Rutherford M.D.   On: 08/18/2023 15:13   DG Abd 1 View  Result Date: 08/17/2023 CLINICAL DATA:  Measures cecal diameter EXAM: ABDOMEN - 1 VIEW COMPARISON:  CT 08/17/2023, radiograph 05/02/2023, 05/03/2023 FINDINGS: Excreted contrast within the left renal collecting system with mild hydronephrosis. Contrast in the urinary bladder. Right-sided ureteral stent. Considerable gaseous dilatation of the bowel, air distended colon measuring up to 10.5 cm in the left upper quadrant. Estimated cecal diameter of 8.1 cm. IMPRESSION: 1. Considerable gaseous dilatation of the bowel, with distended colon measuring up to 10.5 cm in the left upper quadrant. 2. Excreted contrast within the left renal collecting system with mild hydronephrosis. Right-sided ureteral stent. Electronically Signed   By: Jasmine Pang M.D.   On: 08/17/2023 17:51   CT ABDOMEN PELVIS W CONTRAST  Result Date: 08/17/2023 CLINICAL DATA:  Abdominal pain. EXAM: CT ABDOMEN AND PELVIS WITH CONTRAST TECHNIQUE: Multidetector CT imaging of the abdomen and pelvis was performed using the standard protocol following bolus administration of intravenous contrast. RADIATION DOSE REDUCTION: This exam was performed according to the departmental dose-optimization program which includes automated exposure  control, adjustment of the mA and/or kV according to patient size and/or use of iterative reconstruction technique. CONTRAST:  OMNIPAQUE IOHEXOL 300 MG/ML  SOLN COMPARISON:  CT abdomen pelvis dated 04/29/2023. FINDINGS: Evaluation is limited due to body habitus. Lower chest: The visualized lung bases are clear. No intra-abdominal free air or free fluid. Hepatobiliary: The liver is unremarkable. There is mild biliary dilatation, post cholecystectomy. No retained calcified stone noted in the central CBD. Pancreas: Unremarkable. No pancreatic ductal dilatation or surrounding inflammatory changes. Spleen: Normal in size without focal abnormality. Adrenals/Urinary Tract: The left adrenal gland is unremarkable. There is nodular thickening of the medial limb of the right adrenal gland similar to prior CT. Multiple nonobstructing bilateral renal calculi measuring up to 15 mm in the inferior pole of the right kidney and 16 mm in the inferior pole of the left kidney. There has been interval placement of a pigtail right ureteral stent with proximal tip in the right renal pelvis and distal end in the urinary bladder. The previously seen right ureteral calculus is no longer visualized. Interval improvement in the right-sided hydronephrosis since the prior CT. Mild residual right hydronephrosis remains. There is thickened appearance of the urothelium of the right renal collecting system and ureter which may represent chronic UTI. Correlation with urinalysis recommended. There is mild left hydronephrosis, new since the prior  CT. No stone identified in the left ureter. The urinary bladder is decompressed around a Foley catheter. The previously seen stone within the bladder is not visualized on today's exam and has likely passed. Stomach/Bowel: Postsurgical changes of gastric sleeve. There is progressive diffuse air distention of the colon without mechanical obstruction consistent with colonic ileus. No evidence of small-bowel  obstruction. The appendix is normal. Vascular/Lymphatic: Mild aortoiliac atherosclerotic disease. The IVC is unremarkable. No portal venous gas. There is no adenopathy. Reproductive: The prostate is grossly unremarkable.  No pelvic mass. Other: None Musculoskeletal: Degenerative changes of the spine. No acute osseous pathology. IMPRESSION: 1. Interval placement of a pigtail right ureteral stent with interval improvement in the right-sided hydronephrosis since the prior CT. Mild residual right hydronephrosis remains. 2. The right ureteral calculus as well as the calculus in the urinary bladder seen on the prior CT are no longer present. 3. Multiple nonobstructing bilateral renal calculi. 4. Mild left hydronephrosis, new since the prior CT. No stone identified in the left ureter. 5. Progressive colonic ileus. No mechanical obstruction. Normal appendix. 6.  Aortic Atherosclerosis (ICD10-I70.0). Electronically Signed   By: Elgie Collard M.D.   On: 08/17/2023 15:42   DG C-Arm 1-60 Min-No Report  Result Date: 08/12/2023 Fluoroscopy was utilized by the requesting physician.  No radiographic interpretation.   (Echo, Carotid, EGD, Colonoscopy, ERCP)    Subjective: Seen in the morning rounds.  Denies any complaints.  Eating soft diet.  Tolerating.  Multiple bowel movements and some of them with a large amount overnight.  Eager to go home.   Discharge Exam: Vitals:   08/20/23 0600 08/20/23 0727  BP: (!) 189/94   Pulse: 65 (!) 58  Resp: 14 16  Temp:    SpO2: 100% 100%   Vitals:   08/20/23 0400 08/20/23 0410 08/20/23 0600 08/20/23 0727  BP:  (!) 176/95 (!) 189/94   Pulse:  63 65 (!) 58  Resp:  12 14 16   Temp: 97.9 F (36.6 C)     TempSrc: Axillary     SpO2:  100% 100% 100%  Weight:      Height:        General: Pt is alert, awake, not in acute distress Cardiovascular: RRR, S1/S2 +, no rubs, no gallops Respiratory: CTA bilaterally, no wheezing, no rhonchi Abdominal: Soft, NT, ND, bowel  sounds +, distended, obese and pendulous.  Nontender. Foley catheter with clear urine. Extremities: Chronic nonpitting edema.  no cyanosis    The results of significant diagnostics from this hospitalization (including imaging, microbiology, ancillary and laboratory) are listed below for reference.     Microbiology: Recent Results (from the past 240 hour(s))  Urine Culture     Status: None   Collection Time: 08/17/23 12:15 PM   Specimen: Urine, Catheterized  Result Value Ref Range Status   Specimen Description   Final    URINE, CATHETERIZED Performed at [facility], [address], [place], [place] [address]    Special Requests   Final    NONE Performed at [facility], [address], [place], [place] [address]    Culture   Final    NO GROWTH Performed at [facility], [address], [place], [place] [address]    Report Status 08/18/2023 FINAL  Final  MRSA Next Gen by PCR, Nasal     Status: None   Collection Time: 08/18/23 11:20 AM   Specimen: Nasal Mucosa; Nasal Swab  Result Value Ref Range  Status   MRSA by PCR Next Gen NOT DETECTED NOT DETECTED Final    Comment: (NOTE) The GeneXpert MRSA Assay (FDA approved for NASAL specimens only), is one component of a comprehensive MRSA colonization surveillance program. It is not intended to diagnose MRSA infection nor to guide or monitor treatment for MRSA infections. Test performance is not FDA approved in patients less than 8 years old. Performed at [facility], [address], [place], [place] [address]      Labs: BNP (last 3 results) Recent Labs    04/29/23 1701  BNP 123.5*   Basic Metabolic Panel: Recent Labs  Lab 08/17/23 1030 08/18/23 0252 08/18/23 1657 08/19/23 0254  NA 143 142 141 140  K 3.7 3.7 3.1* 3.2*  CL 106 107 108 107  CO2 26 26 25 26   GLUCOSE 104* 86 96 98  BUN 23 20 16 13   CREATININE 0.82 0.82 0.69 0.63  CALCIUM 9.4 9.0 8.7*  8.3*  MG  --  2.3  --   --    Liver Function Tests: Recent Labs  Lab 08/17/23 2039  AST 19  ALT 14  ALKPHOS 125  BILITOT 0.6  PROT 8.0  ALBUMIN 3.7   Recent Labs  Lab 08/17/23 2039  LIPASE 34   No results for input(s): "AMMONIA" in the last 168 hours. CBC: Recent Labs  Lab 08/17/23 1030 08/18/23 0252 08/19/23 0254  WBC 5.1 5.8 5.9  HGB 11.7* 11.2* 10.4*  HCT 37.5* 36.9* 32.7*  MCV 82.8 84.1 81.8  PLT 236 235 191   Cardiac Enzymes: No results for input(s): "CKTOTAL", "CKMB", "CKMBINDEX", "TROPONINI" in the last 168 hours. BNP: Invalid input(s): "POCBNP" CBG: No results for input(s): "GLUCAP" in the last 168 hours. D-Dimer No results for input(s): "DDIMER" in the last 72 hours. Hgb A1c No results for input(s): "HGBA1C" in the last 72 hours. Lipid Profile No results for input(s): "CHOL", "HDL", "LDLCALC", "TRIG", "CHOLHDL", "LDLDIRECT" in the last 72 hours. Thyroid function studies No results for input(s): "TSH", "T4TOTAL", "T3FREE", "THYROIDAB" in the last 72 hours.  Invalid input(s): "FREET3" Anemia work up Recent Labs    08/19/23 0254  VITAMINB12 267  FERRITIN 55  TIBC 272  IRON 25*   Urinalysis    Component Value Date/Time   COLORURINE RED (A) 08/17/2023 1215   APPEARANCEUR CLOUDY (A) 08/17/2023 1215   LABSPEC 1.019 08/17/2023 1215   PHURINE 5.0 08/17/2023 1215   GLUCOSEU NEGATIVE 08/17/2023 1215   HGBUR LARGE (A) 08/17/2023 1215   BILIRUBINUR NEGATIVE 08/17/2023 1215   KETONESUR 5 (A) 08/17/2023 1215   PROTEINUR >=300 (A) 08/17/2023 1215   UROBILINOGEN 0.2 12/27/2013 2115   NITRITE NEGATIVE 08/17/2023 1215   LEUKOCYTESUR MODERATE (A) 08/17/2023 1215   Sepsis Labs Recent Labs  Lab 08/17/23 1030 08/18/23 0252 08/19/23 0254  WBC 5.1 5.8 5.9   Microbiology Recent Results (from the past 240 hour(s))  Urine Culture     Status: None   Collection Time: 08/17/23 12:15 PM   Specimen: Urine, Catheterized  Result Value Ref Range Status    Specimen Description   Final    URINE, CATHETERIZED Performed at [facility], [address] [place], [place] [address]    Special Requests   Final    NONE Performed at [facility], [address] [place], [place] [address]    Culture   Final    NO GROWTH Performed at [facility], [address] [place], [place] [address]  Report Status 08/18/2023 FINAL  Final  MRSA Next Gen by PCR, Nasal     Status: None   Collection Time: 08/18/23 11:20 AM   Specimen: Nasal Mucosa; Nasal Swab  Result Value Ref Range Status   MRSA by PCR Next Gen NOT DETECTED NOT DETECTED Final    Comment: (NOTE) The GeneXpert MRSA Assay (FDA approved for NASAL specimens only), is one component of a comprehensive MRSA colonization surveillance program. It is not intended to diagnose MRSA infection nor to guide or monitor treatment for MRSA infections. Test performance is not FDA approved in patients less than 21 years old. Performed at [facility], [address] [place], [place] [address]      Time coordinating discharge: 32 minutes  SIGNED:   Dorcas Carrow, MD  [facility] 08/20/2023, 7:39 AM

## 2023-09-04 ENCOUNTER — Other Ambulatory Visit: Payer: Self-pay | Admitting: Urology

## 2023-10-06 ENCOUNTER — Encounter (HOSPITAL_COMMUNITY): Payer: Self-pay | Admitting: Nurse Practitioner

## 2023-10-06 ENCOUNTER — Other Ambulatory Visit (HOSPITAL_COMMUNITY): Payer: Self-pay | Admitting: Nurse Practitioner

## 2023-10-06 DIAGNOSIS — N202 Calculus of kidney with calculus of ureter: Secondary | ICD-10-CM

## 2023-10-13 ENCOUNTER — Ambulatory Visit (HOSPITAL_COMMUNITY)
Admission: RE | Admit: 2023-10-13 | Discharge: 2023-10-13 | Disposition: A | Discharge status: Home / Self Care | Payer: Medicare Other | Source: Ambulatory Visit | Attending: Nurse Practitioner | Admitting: Nurse Practitioner

## 2023-10-13 ENCOUNTER — Other Ambulatory Visit: Payer: Self-pay | Admitting: Urology

## 2023-10-13 DIAGNOSIS — N202 Calculus of kidney with calculus of ureter: Secondary | ICD-10-CM | POA: Insufficient documentation

## 2023-10-14 ENCOUNTER — Ambulatory Visit (HOSPITAL_COMMUNITY): Payer: Medicare Other

## 2023-12-05 ENCOUNTER — Other Ambulatory Visit (HOSPITAL_COMMUNITY): Payer: Self-pay | Admitting: Nurse Practitioner

## 2023-12-05 DIAGNOSIS — Z87442 Personal history of urinary calculi: Secondary | ICD-10-CM

## 2023-12-05 DIAGNOSIS — R109 Unspecified abdominal pain: Secondary | ICD-10-CM

## 2023-12-11 ENCOUNTER — Ambulatory Visit (HOSPITAL_COMMUNITY)
Admission: RE | Admit: 2023-12-11 | Discharge: 2023-12-11 | Disposition: A | Discharge status: Home / Self Care | Payer: Medicare Other | Source: Ambulatory Visit | Attending: Nurse Practitioner | Admitting: Nurse Practitioner

## 2023-12-11 DIAGNOSIS — Z87442 Personal history of urinary calculi: Secondary | ICD-10-CM | POA: Diagnosis present

## 2023-12-11 DIAGNOSIS — R109 Unspecified abdominal pain: Secondary | ICD-10-CM | POA: Diagnosis present

## 2024-01-08 ENCOUNTER — Encounter: Payer: Self-pay | Admitting: Family Medicine

## 2024-01-08 ENCOUNTER — Ambulatory Visit: Payer: Medicare Other | Admitting: Family Medicine

## 2024-01-08 VITALS — BP 134/84 | HR 60 | Temp 98.4°F | Ht 70.0 in

## 2024-01-08 DIAGNOSIS — I10 Essential (primary) hypertension: Secondary | ICD-10-CM

## 2024-01-08 DIAGNOSIS — M48062 Spinal stenosis, lumbar region with neurogenic claudication: Secondary | ICD-10-CM | POA: Diagnosis not present

## 2024-01-08 DIAGNOSIS — Z978 Presence of other specified devices: Secondary | ICD-10-CM

## 2024-01-08 DIAGNOSIS — M25562 Pain in left knee: Secondary | ICD-10-CM

## 2024-01-08 DIAGNOSIS — K5909 Other constipation: Secondary | ICD-10-CM | POA: Diagnosis not present

## 2024-01-08 DIAGNOSIS — Z7689 Persons encountering health services in other specified circumstances: Secondary | ICD-10-CM

## 2024-01-08 DIAGNOSIS — M25561 Pain in right knee: Secondary | ICD-10-CM | POA: Diagnosis not present

## 2024-01-08 DIAGNOSIS — G8929 Other chronic pain: Secondary | ICD-10-CM

## 2024-01-08 LAB — COMPREHENSIVE METABOLIC PANEL WITH GFR
ALT: 8 U/L (ref 0–53)
AST: 14 U/L (ref 0–37)
Albumin: 3.7 g/dL (ref 3.5–5.2)
Alkaline Phosphatase: 135 U/L — ABNORMAL HIGH (ref 39–117)
BUN: 20 mg/dL (ref 6–23)
CO2: 29 meq/L (ref 19–32)
Calcium: 8.3 mg/dL — ABNORMAL LOW (ref 8.4–10.5)
Chloride: 104 meq/L (ref 96–112)
Creatinine, Ser: 0.65 mg/dL (ref 0.40–1.50)
GFR: 98.43 mL/min (ref >60.00)
Glucose, Bld: 88 mg/dL (ref 70–99)
Potassium: 3.9 meq/L (ref 3.5–5.1)
Sodium: 138 meq/L (ref 135–145)
Total Bilirubin: 0.5 mg/dL (ref 0.2–1.2)
Total Protein: 7.6 g/dL (ref 6.0–8.3)

## 2024-01-08 LAB — CBC WITH DIFFERENTIAL/PLATELET
Basophils Absolute: 0 10*3/uL (ref 0.0–0.1)
Basophils Relative: 1.1 % (ref 0.0–3.0)
Eosinophils Absolute: 0.2 10*3/uL (ref 0.0–0.7)
Eosinophils Relative: 4.5 % (ref 0.0–5.0)
HCT: 37.7 % — ABNORMAL LOW (ref 39.0–52.0)
Hemoglobin: 11.9 g/dL — ABNORMAL LOW (ref 13.0–17.0)
Lymphocytes Relative: 27.2 % (ref 12.0–46.0)
Lymphs Abs: 1.2 10*3/uL (ref 0.7–4.0)
MCHC: 31.5 g/dL (ref 30.0–36.0)
MCV: 77.6 fl — ABNORMAL LOW (ref 78.0–100.0)
Monocytes Absolute: 0.5 10*3/uL (ref 0.1–1.0)
Monocytes Relative: 10.5 % (ref 3.0–12.0)
Neutro Abs: 2.6 10*3/uL (ref 1.4–7.7)
Neutrophils Relative %: 56.7 % (ref 43.0–77.0)
Platelets: 247 10*3/uL (ref 150.0–400.0)
RBC: 4.85 Mil/uL (ref 4.22–5.81)
RDW: 18.3 % — ABNORMAL HIGH (ref 11.5–15.5)
WBC: 4.5 10*3/uL (ref 4.0–10.5)

## 2024-01-08 LAB — LIPID PANEL
Cholesterol: 132 mg/dL (ref 0–200)
HDL: 49.1 mg/dL (ref >39.00)
LDL Cholesterol: 70 mg/dL (ref 0–99)
NonHDL: 82.99
Total CHOL/HDL Ratio: 3
Triglycerides: 65 mg/dL (ref 0.0–149.0)
VLDL: 13 mg/dL (ref 0.0–40.0)

## 2024-01-08 MED ORDER — TRAMADOL HCL 50 MG PO TABS: 50.0000 mg | ORAL_TABLET | Freq: Two times a day (BID) | ORAL | 0 refills | Status: DC | PRN

## 2024-01-08 NOTE — Progress Notes (Unsigned)
 Established Patient Office Visit   Subjective  Patient ID: [name] [name], male    DOB: 1955/11/15  Age: 68 y.o. MRN: [id number]  Chief Complaint  Patient presents with   Establish Care    Pt is here to Est Care Pt reports back pain, left knee RT side Pt reports he is not FASTING  Pt accompanied by his wife.  Patient is a 68 year old male seen for establish care and follow-up on chronic conditions.  Patient previously seen by Renford Dills, MD.  HTN: Typically 120s/80s.  Taking Norvasc 5 mg daily.  Spinal stenosis: Diagnosed several years ago.  Was in PT years ago.  Now in power wheelchair due to inability to stand or walk.  OA: Bilateral knees.  Followed by Dr. Magnus Ivan, Ortho.  Using Voltaren gel, heat, knee brace, ibuprofen.  Taking tramadol.  Urinary catheter: Patient states developed urinary retention s/p a surgery 3 years ago.  Catheter in place, changed by [facility] RN.  Followed by urology, Dr. Wilson Singer.  Heartburn: On omeprazole 40 mg daily.  Chronic constipation: Taking MiraLAX and increasing fiber.  Allergies: Unasyn-hives  Social history: Patient is married.  Patient is retired.  Patient endorses occasional alcohol use.  Denies tobacco and drug use.  Health maintenance: Eye exam-04/2023 Influenza vaccine-2024 Pneumovax-2024    Patient Active Problem List   Diagnosis Date Noted   Ileus, postoperative (HCC) 08/19/2023   Abnormal x-ray of abdomen 08/18/2023   Chronic indwelling Foley catheter 04/30/2023   Ileus (HCC) 04/29/2023   Abdominal pain, epigastric 11/26/2022   Dyspepsia 11/26/2022   H/O gastric sleeve 11/26/2022   GERD without esophagitis 11/26/2022   Gastroesophageal reflux disease 10/22/2022   Screening for colorectal cancer 10/22/2022   Abdominal distension 10/22/2022   E-coli UTI 12/29/2018   Acute on chronic anemia    Radiculopathy 12/10/2018   Cervical myelopathy (HCC) 12/07/2018   Microcytic hypochromic anemia 08/27/2018   At risk for  adverse drug event 08/10/2018   Chronic pain syndrome    Labile blood pressure    Urinary retention    Hypokalemia    Hypertension    Acute blood loss anemia    Anemia of chronic disease    Abscess in epidural space of lumbar spine    Effusion, left knee    Pain and swelling of wrist, left    Complete tear of left rotator cuff 07/03/2018    Class: Chronic   AKI (acute kidney injury) (HCC)    MSSA bacteremia    Septic arthritis of knee, left (HCC) 06/30/2018    Class: Acute   Leukocytosis    Postoperative seroma involving nervous system after nervous system procedure    Shoulder pain, bilateral    Thoracic myelopathy 06/28/2018   Spondylosis, thoracic, with myelopathy 06/28/2018    Class: Acute   Abscess in epidural space of L2-L5 lumbar spine 06/28/2018    Class: Acute   Acute urinary retention 06/28/2018   Left cervical radiculopathy 06/28/2018    Class: Acute   Spinal stenosis of lumbar region 11/17/2017    Class: Chronic   Status post lumbar laminectomy 11/17/2017   Lumbar stenosis with neurogenic claudication 08/18/2017   Forestier's disease of thoracolumbar region 08/18/2017   Degenerative disc disease, lumbar 08/18/2017   Lumbar radiculopathy 09/30/2016   Morbid (severe) obesity due to excess calories (HCC) 09/30/2016   Biliary calculus with acute cholecystitis 03/21/2016   Acute pyelonephritis 12/28/2013   Ureteral stone with hydronephrosis 12/28/2013   Acute kidney failure (HCC) 12/27/2013  Severe sepsis with acute organ dysfunction (HCC) 12/27/2013   Hypotension 12/27/2013   Lactic acidosis 12/27/2013   Sepsis secondary to UTI (HCC) 12/27/2013   Empyema of pleural space (HCC) 08/30/2008   PRURIGO 08/30/2008   Sleep apnea 08/30/2008   Past Medical History:  Diagnosis Date   Anemia    Arthritis    Chronic kidney disease    HX acute kidney failure / acute pyelonephritis / hydronephrosis / severe sepsis per discharge summary 12/27/13   GERD (gastroesophageal  reflux disease)    History of kidney stones    Hypertension    Morbid obesity (HCC)    Obstructive sleep apnea    does not need c pap since 110 lb wt loss   Osteoporosis    Prurigo 2002   Scars    ON ARMS FROM CHEMICAL EXPLOSION 1999   Spinal stenosis    Past Surgical History:  Procedure Laterality Date   ANTERIOR CERVICAL DECOMP/DISCECTOMY FUSION N/A 12/07/2018   Procedure: ANTERIOR CERVICAL DECOMPRESSION FUSION - CERVICAL THREE-CERVICAL FOUR, CERVICAL FOUR-CERVICAL FIVE, CERVICAL FIVE-CERVICAL SIX;  Surgeon: Julio Sicks, MD;  Location: [facility];  Service: Neurosurgery;  Laterality: N/A;   BACK SURGERY  01/23/2010   lumbar   BIOPSY  11/26/2022   Procedure: BIOPSY;  Surgeon: Shellia Cleverly, DO;  Location: [facility];  Service: Gastroenterology;;   CHOLECYSTECTOMY N/A 03/24/2016   Procedure: LAPAROSCOPIC CHOLECYSTECTOMY WITH INTRAOPERATIVE CHOLANGIOGRAM;  Surgeon: Chevis Pretty III, MD;  Location: [facility];  Service: General;  Laterality: N/A;   CIRCUMCISION     CYSTOSCOPY WITH RETROGRADE PYELOGRAM, URETEROSCOPY AND STENT PLACEMENT Left 01/20/2014   Procedure: CYSTOSCOPY WITH RETROGRADE PYELOGRAM, URETEROSCOPY AND STENT EXCHANGE;  Surgeon: Milford Cage, MD;  Location: [facility];  Service: Urology;  Laterality: Left;  bugbee bladder fulguration   CYSTOSCOPY WITH STENT PLACEMENT Left 12/28/2013   Procedure: CYSTOSCOPY WITH STENT PLACEMENT left retrograde pyleogram;  Surgeon: Milford Cage, MD;  Location: [facility];  Service: Urology;  Laterality: Left;   CYSTOSCOPY/URETEROSCOPY/HOLMIUM LASER/STENT PLACEMENT Right 05/15/2021   Procedure: CYSTOSCOPY RIGHT RETROGRADE RIGHT URETEROSCOPY/HOLMIUM LASER/STENT PLACEMENT;  Surgeon: Bjorn Pippin, MD;  Location: [facility];  Service: Urology;  Laterality: Right;   CYSTOSCOPY/URETEROSCOPY/HOLMIUM LASER/STENT PLACEMENT Right 07/08/2023   Procedure: CYSTOSCOPY, RIGHT RETROGRADE PYELOGRAM, RIGHT URETERAL STENT PLACEMENT;  Surgeon: Bjorn Pippin, MD;   Location: [facility];  Service: Urology;  Laterality: Right;  60 MINS FOR CASE   CYSTOSCOPY/URETEROSCOPY/HOLMIUM LASER/STENT PLACEMENT Right 08/12/2023   Procedure: CYSTOSCOPY RIGHT   URETEROSCOPY/HOLMIUM LASER/STENT EXCHANGE;  Surgeon: Bjorn Pippin, MD;  Location: [facility];  Service: Urology;  Laterality: Right;   ESOPHAGOGASTRODUODENOSCOPY N/A 12/07/2012   Procedure: ESOPHAGOGASTRODUODENOSCOPY (EGD);  Surgeon: Lodema Pilot, DO;  Location: [facility];  Service: General;  Laterality: N/A;   ESOPHAGOGASTRODUODENOSCOPY (EGD) WITH PROPOFOL N/A 11/26/2022   Procedure: ESOPHAGOGASTRODUODENOSCOPY (EGD) WITH PROPOFOL;  Surgeon: Shellia Cleverly, DO;  Location: [facility];  Service: Gastroenterology;  Laterality: N/A;   EYE SURGERY Bilateral 2024   cataracts removed   FLEXIBLE SIGMOIDOSCOPY N/A 05/01/2023   Procedure: FLEXIBLE SIGMOIDOSCOPY;  Surgeon: Iva Boop, MD;  Location: [facility];  Service: Gastroenterology;  Laterality: N/A;   FLEXIBLE SIGMOIDOSCOPY N/A 08/17/2023   Procedure: FLEXIBLE SIGMOIDOSCOPY;  Surgeon: Jeani Hawking, MD;  Location: [facility];  Service: Gastroenterology;  Laterality: N/A;   HOLMIUM LASER APPLICATION Left 01/20/2014   Procedure: HOLMIUM LASER APPLICATION;  Surgeon: Milford Cage, MD;  Location: [facility];  Service: Urology;  Laterality: Left;   KNEE ARTHROSCOPY Left 06/29/2018  Procedure: ARTHROSCOPY KNEE;  Surgeon: Kerrin Champagne, MD;  Location: [facility] OR;  Service: Orthopedics;  Laterality: Left;   LAPAROSCOPIC GASTRIC SLEEVE RESECTION N/A 12/07/2012   Procedure: LAPAROSCOPIC GASTRIC SLEEVE RESECTION;  Surgeon: Lodema Pilot, DO;  Location: [facility];  Service: General;  Laterality: N/A;  laparoscopic sleeve gastrectomy with EGD   LUMBAR LAMINECTOMY N/A 11/17/2017   Procedure: L2-3 LAMINECTOMY AND REDO LAMINECTOMIES  L3-4, L4-5 AND L5-S1;  Surgeon: Kerrin Champagne, MD;  Location: [facility];  Service: Orthopedics;  Laterality: N/A;   LUMBAR WOUND DEBRIDEMENT N/A 06/29/2018    Procedure: LUMBAR WOUND DEBRIDEMENT DRAINAGE AND IRRIGATION; AND ASPIRATION OF LEFT KNEE;  Surgeon: Kerrin Champagne, MD;  Location: [facility];  Service: Orthopedics;  Laterality: N/A;   SAVORY DILATION N/A 11/26/2022   Procedure: SAVORY DILATION;  Surgeon: Shellia Cleverly, DO;  Location: [facility];  Service: Gastroenterology;  Laterality: N/A;   TEE WITHOUT CARDIOVERSION N/A 07/01/2018   Procedure: TRANSESOPHAGEAL ECHOCARDIOGRAM (TEE);  Surgeon: Laurey Morale, MD;  Location: [facility] ENDOSCOPY;  Service: Cardiovascular;  Laterality: N/A;   Social History   Tobacco Use   Smoking status: Former    Current packs/day: 0.00    Types: Cigarettes    Quit date: 04/09/1974    Years since quitting: 49.8   Smokeless tobacco: Never  Vaping Use   Vaping status: Never Used  Substance Use Topics   Alcohol use: No   Drug use: No   Family History  Problem Relation Age of Onset   Diabetes Mother    Heart disease Father    Colon cancer Neg Hx    Colon polyps Neg Hx    Esophageal cancer Neg Hx    Rectal cancer Neg Hx    Stomach cancer Neg Hx    Allergies  Allergen Reactions   Unasyn [Ampicillin-Sulbactam Sodium] Hives, Itching, Swelling and Rash      ROS Negative unless stated above    Objective:     BP 134/84   Pulse 60   Temp 98.4 F (36.9 C)   Ht 5\' 10"  (1.778 m)   SpO2 100%   BMI 52.52 kg/m  BP Readings from Last 3 Encounters:  01/08/24 134/84  08/20/23 (!) 166/88  08/12/23 (!) 163/92   Wt Readings from Last 3 Encounters:  08/17/23 (!) 366 lb (166 kg)  08/12/23 (!) 356 lb (161.5 kg)  07/29/23 (!) 356 lb (161.5 kg)    Physical Exam Constitutional:      General: He is not in acute distress.    Appearance: Normal appearance. He is obese.  HENT:     Head: Normocephalic and atraumatic.     Nose: Nose normal.     Mouth/Throat:     Mouth: Mucous membranes are moist.  Eyes:     Extraocular Movements: Extraocular movements intact.     Conjunctiva/sclera: Conjunctivae  normal.  Cardiovascular:     Rate and Rhythm: Normal rate and regular rhythm.     Heart sounds: Normal heart sounds. No murmur heard.    No gallop.  Pulmonary:     Effort: Pulmonary effort is normal. No respiratory distress.     Breath sounds: Normal breath sounds. No wheezing, rhonchi or rales.  Abdominal:     General: There is distension.  Skin:    General: Skin is warm and dry.  Neurological:     Mental Status: He is alert and oriented to person, place, and time.     Cranial Nerves: Cranial nerves 2-12 are  intact.     Comments: Sitting in motorized wheelchair.  Gait not assessed.      No results found for any visits on 01/08/24.    Assessment & Plan:  Spinal stenosis of lumbar region with neurogenic claudication -severe -currently unable to stand or walk.  Requires motorized wheelchair -continue f/u with neurosurg prn -referral to pain management for medication and other treatment options -limited refill on Tramadol provided. -PDMP reviewed. -HH PT -     Ambulatory referral to Pain Clinic -     Ambulatory referral to Home Health -     traMADol HCl; Take 1 tablet (50 mg total) by mouth every 12 (twelve) hours as needed for up to 5 days.  Dispense: 10 tablet; Refill: 0  Chronic pain of both knees -h/o OA -continue tramadol -not currently a surgical candidate -continue f/u with Ortho -     CBC with Differential/Platelet -     Ambulatory referral to Pain Clinic -     Ambulatory referral to Home Health -     traMADol HCl; Take 1 tablet (50 mg total) by mouth every 12 (twelve) hours as needed for up to 5 days.  Dispense: 10 tablet; Refill: 0  Chronic indwelling Foley catheter -due to continued urinary retention s/p surgery 3 yrs ago -continue regular replacement with HH RN -f/u with Urology -     Ambulatory referral to Home Health  Essential hypertension -currently controlled. -continue norvasc 5 mg daily. -lifestyle modifications -monitor bp at home. -      Comprehensive metabolic panel with GFR -     CBC with Differential/Platelet -     TSH -     T4, free  Morbid obesity (HCC) -Body mass index is 52.52 kg/m. -chair exercises -HH PT -lifestyle modifications strongly encouraged. -     Comprehensive metabolic panel with GFR -     CBC with Differential/Platelet -     TSH -     T4, free -     Hemoglobin A1c -     Lipid panel -     Ambulatory referral to Home Health  Chronic constipation -daily bowel regimen encouraged.  Continue Miralax and fiber -     Comprehensive metabolic panel with GFR  Encounter to establish care -We reviewed the PMH, PSH, FH, SH, Meds and Allergies. -We provided refills for any medications we will prescribe as needed. -We addressed current concerns per orders and patient instructions. -We have asked for records for pertinent exams, studies, vaccines and notes from previous providers. -We have advised patient to follow up per instructions below.   Return in about 3 months (around 04/09/2024), or if symptoms worsen or fail to improve.   Deeann Saint, MD

## 2024-01-09 LAB — T4, FREE: Free T4: 0.99 ng/dL (ref 0.60–1.60)

## 2024-01-09 LAB — TSH: TSH: 1.78 u[IU]/mL (ref 0.35–5.50)

## 2024-01-10 LAB — HEMOGLOBIN A1C: Hgb A1c MFr Bld: 5.6 % (ref 4.6–6.5)

## 2024-01-15 ENCOUNTER — Encounter: Payer: Self-pay | Admitting: Physical Medicine & Rehabilitation

## 2024-01-23 ENCOUNTER — Other Ambulatory Visit: Payer: Self-pay | Admitting: Family Medicine

## 2024-01-23 DIAGNOSIS — R748 Abnormal levels of other serum enzymes: Secondary | ICD-10-CM

## 2024-01-23 NOTE — Addendum Note (Signed)
 Addended by: Abbe Amsterdam R on: 01/23/2024 05:44 PM   Modules accepted: Orders

## 2024-01-26 ENCOUNTER — Other Ambulatory Visit: Payer: Self-pay

## 2024-01-26 DIAGNOSIS — R748 Abnormal levels of other serum enzymes: Secondary | ICD-10-CM

## 2024-02-12 ENCOUNTER — Telehealth: Payer: Self-pay | Admitting: *Deleted

## 2024-02-12 ENCOUNTER — Other Ambulatory Visit

## 2024-02-12 NOTE — Telephone Encounter (Signed)
 Called [place]- [facility] left a VM, per Dr. Arliss Lam giving OV for PT

## 2024-02-12 NOTE — Telephone Encounter (Signed)
 Copied from CRM [id number]. Topic: Clinical - Home Health Verbal Orders >> Feb 12, 2024  1:04 PM Albertha Alosa wrote: Caller/Agency: [facility]  Callback Number: [phone] - confidential voicemail , can leave vociemail  Service Requested: Physical Therapy Frequency: 1 week 1 , 2 week 1 and 1 week 6  Any new concerns about the patient? No

## 2024-02-24 ENCOUNTER — Telehealth: Payer: Self-pay

## 2024-02-24 NOTE — Telephone Encounter (Signed)
 Ok

## 2024-02-24 NOTE — Telephone Encounter (Unsigned)
 Copied from CRM [id number]. Topic: Clinical - Home Health Verbal Orders >> Feb 24, 2024  1:38 PM Adonis Hoot wrote: Caller/Agency: [facility] Callback Number: [phone] Service Requested: Physical Therapy Frequency: 2wk3 1wk3 Any new concerns about the patient? No

## 2024-02-25 NOTE — Telephone Encounter (Signed)
 Patient is not using [facility], patient requested [facility]

## 2024-02-26 ENCOUNTER — Encounter: Admitting: Physical Medicine & Rehabilitation

## 2024-02-29 ENCOUNTER — Other Ambulatory Visit: Payer: Self-pay | Admitting: Family Medicine

## 2024-02-29 DIAGNOSIS — M48062 Spinal stenosis, lumbar region with neurogenic claudication: Secondary | ICD-10-CM

## 2024-02-29 DIAGNOSIS — G8929 Other chronic pain: Secondary | ICD-10-CM

## 2024-03-04 ENCOUNTER — Encounter: Payer: Self-pay | Admitting: Physical Medicine & Rehabilitation

## 2024-03-04 ENCOUNTER — Encounter: Attending: Physical Medicine & Rehabilitation | Admitting: Physical Medicine & Rehabilitation

## 2024-03-04 VITALS — BP 188/105 | HR 67 | Ht 70.0 in

## 2024-03-04 DIAGNOSIS — G894 Chronic pain syndrome: Secondary | ICD-10-CM

## 2024-03-04 DIAGNOSIS — Z79891 Long term (current) use of opiate analgesic: Secondary | ICD-10-CM

## 2024-03-04 DIAGNOSIS — N189 Chronic kidney disease, unspecified: Secondary | ICD-10-CM | POA: Diagnosis present

## 2024-03-04 DIAGNOSIS — Z5181 Encounter for therapeutic drug level monitoring: Secondary | ICD-10-CM

## 2024-03-04 DIAGNOSIS — M48062 Spinal stenosis, lumbar region with neurogenic claudication: Secondary | ICD-10-CM

## 2024-03-04 MED ORDER — GABAPENTIN 100 MG PO CAPS: 100.0000 mg | ORAL_CAPSULE | Freq: Three times a day (TID) | ORAL | 3 refills | Status: DC

## 2024-03-04 NOTE — Progress Notes (Addendum)
 Subjective:    Patient ID: [name] [name], male    DOB: 1955-11-17, 68 y.o.   MRN: [id number]  HPI  HPI  [name] [name] is a 68 y.o. year old male  who  has a past medical history of Anemia, Arthritis, Chronic kidney disease, GERD (gastroesophageal reflux disease), History of kidney stones, Hypertension, Morbid obesity (HCC), Obstructive sleep apnea, Osteoporosis, Prurigo (2002), Scars, and Spinal stenosis.   They are presenting to PM&R clinic as a new patient for pain management evaluation. They were referred by Dr. Clotilda Single MD for treatment of spinal stenosis pain.  Back pain has been present for over 10 years.  He played football when he was younger and this this contributed. He saw Dr. Lucilla in the past, patient has a history of lumbar multilevel laminectomy for recurrent lumbar spinal stenosis, in 2019. He reports that he was walking with cane or walker at this time. Around this time he began not taking the best care of himself.  He started drinking more alcohol and gained weight.   He stopped exercising around this time. He developed worsening weakness in his legs greater than arms.  He later had neck surgery with ACDF C3-C6 completed by Dr. Malcolm 12/07/2018 and completed rehab again.  Patient had continued worsening of the weakness in his legs and has been nonambulatory for several years.  He now has a chronic indwelling Foley catheter, reports this was placed for prostate issues.  Denies any difficulty with bowel function but chart review indicates he has had issues with ileus/atonic colon in the past.  Patient was also seen by Dr. Vernetta for knee osteoarthritis previously.  Patient reports knees hurt more laying in bed and sitting in his wheelchair.  He has pain in his right lower back going down his legs.  Patient reports tramadol  50 mg twice daily overall is keeping his pain under good control.   Red flag symptoms: No red flags for back pain endorsed in Hx or ROS, saddle  anesthesia, loss of bowel or bladder continence, new weakness, new numbness/tingling, and pain waking up at nighttime.  Medications tried: Topical medications- Hemp cream - did not help much- used today Voltaren  helps a little  Nsaids  Aleve- doesn't help much  Tylenol   doesn't help much  Opiates  Tramadol  helps his pain  Oxycodone  years ago- last used after neck surgery  Gabapentin  / Lyrica  - Denies  TCAs -Denies  SNRIs  - Denies     Other treatments: PT/OT - CIR in 2020, just started PT, session today planned TENs unit - Denies  Injections- Years ago had ESI? - Helped just a little  Surgery - L spine and C spine surgery  Goals for pain control:  Be a little more comfortable overall    Prior UDS results: No results found for: LABOPIA, COCAINSCRNUR, LABBENZ, AMPHETMU, THCU, LABBARB    Pain Inventory Average Pain 9 Pain Right Now 10 My pain is sharp, dull, and aching  In the last 24 hours, has pain interfered with the following? General activity 10 Relation with others 7 Enjoyment of life 8 What TIME of day is your pain at its worst? night Sleep (in general) Fair  Pain is worse with: sitting and inactivity Pain improves with: medication, heat, anti-inflammatory medication, Voltaren  Cream Relief from Meds: 10  use a wheelchair  disabled: date disabled 15 years ago I need assistance with the following:  dressing, bathing, and toileting, need help with transfers  Not able  to climb steps do not drive Do not have any goals  Exercise 1 day per week for an hour to work on strength  bladder control problems bowel control problems weakness numbness trouble walking  Any changes since last visit?  no  Primary care PCP Clotilda Single, MD15 years ago    Family History  Problem Relation Age of Onset   Diabetes Mother    Heart disease Father    Colon cancer Neg Hx    Colon polyps Neg Hx    Esophageal cancer Neg Hx    Rectal cancer Neg Hx     Stomach cancer Neg Hx    Social History   Socioeconomic History   Marital status: Married    Spouse name: Not on file   Number of children: Not on file   Years of education: Not on file   Highest education level: Not on file  Occupational History   Not on file  Tobacco Use   Smoking status: Former    Current packs/day: 0.00    Types: Cigarettes    Quit date: 04/09/1974    Years since quitting: 49.9   Smokeless tobacco: Never  Vaping Use   Vaping status: Never Used  Substance and Sexual Activity   Alcohol use: No   Drug use: No   Sexual activity: Not Currently    Birth control/protection: None  Other Topics Concern   Not on file  Social History Narrative   Not on file   Social Drivers of Health   Financial Resource Strain: Not on file  Food Insecurity: No Food Insecurity (08/17/2023)   Hunger Vital Sign    Worried About Running Out of Food in the Last Year: Never true    Ran Out of Food in the Last Year: Never true  Transportation Needs: No Transportation Needs (08/17/2023)   PRAPARE - Administrator, Civil Service (Medical): No    Lack of Transportation (Non-Medical): No  Physical Activity: Not on file  Stress: Not on file  Social Connections: Not on file   Past Surgical History:  Procedure Laterality Date   ANTERIOR CERVICAL DECOMP/DISCECTOMY FUSION N/A 12/07/2018   Procedure: ANTERIOR CERVICAL DECOMPRESSION FUSION - CERVICAL THREE-CERVICAL FOUR, CERVICAL FOUR-CERVICAL FIVE, CERVICAL FIVE-CERVICAL SIX;  Surgeon: Louis Shove, MD;  Location: [facility];  Service: Neurosurgery;  Laterality: N/A;   BACK SURGERY  01/23/2010   lumbar   BIOPSY  11/26/2022   Procedure: BIOPSY;  Surgeon: San Sandor GAILS, DO;  Location: [facility];  Service: Gastroenterology;;   CHOLECYSTECTOMY N/A 03/24/2016   Procedure: LAPAROSCOPIC CHOLECYSTECTOMY WITH INTRAOPERATIVE CHOLANGIOGRAM;  Surgeon: Deward Null III, MD;  Location: [facility];  Service: General;  Laterality: N/A;    CIRCUMCISION     CYSTOSCOPY WITH RETROGRADE PYELOGRAM, URETEROSCOPY AND STENT PLACEMENT Left 01/20/2014   Procedure: CYSTOSCOPY WITH RETROGRADE PYELOGRAM, URETEROSCOPY AND STENT EXCHANGE;  Surgeon: Toribio Neysa Repine, MD;  Location: [facility];  Service: Urology;  Laterality: Left;  bugbee bladder fulguration   CYSTOSCOPY WITH STENT PLACEMENT Left 12/28/2013   Procedure: CYSTOSCOPY WITH STENT PLACEMENT left retrograde pyleogram;  Surgeon: Toribio Neysa Repine, MD;  Location: [facility];  Service: Urology;  Laterality: Left;   CYSTOSCOPY/URETEROSCOPY/HOLMIUM LASER/STENT PLACEMENT Right 05/15/2021   Procedure: CYSTOSCOPY RIGHT RETROGRADE RIGHT URETEROSCOPY/HOLMIUM LASER/STENT PLACEMENT;  Surgeon: Watt Rush, MD;  Location: [facility];  Service: Urology;  Laterality: Right;   CYSTOSCOPY/URETEROSCOPY/HOLMIUM LASER/STENT PLACEMENT Right 07/08/2023   Procedure: CYSTOSCOPY, RIGHT RETROGRADE PYELOGRAM, RIGHT URETERAL STENT PLACEMENT;  Surgeon: Watt Rush, MD;  Location: [facility];  Service: Urology;  Laterality: Right;  60 MINS FOR CASE   CYSTOSCOPY/URETEROSCOPY/HOLMIUM LASER/STENT PLACEMENT Right 08/12/2023   Procedure: CYSTOSCOPY RIGHT   URETEROSCOPY/HOLMIUM LASER/STENT EXCHANGE;  Surgeon: Watt Rush, MD;  Location: [facility];  Service: Urology;  Laterality: Right;   ESOPHAGOGASTRODUODENOSCOPY N/A 12/07/2012   Procedure: ESOPHAGOGASTRODUODENOSCOPY (EGD);  Surgeon: Redell Faith, DO;  Location: [facility];  Service: General;  Laterality: N/A;   ESOPHAGOGASTRODUODENOSCOPY (EGD) WITH PROPOFOL  N/A 11/26/2022   Procedure: ESOPHAGOGASTRODUODENOSCOPY (EGD) WITH PROPOFOL ;  Surgeon: San Sandor GAILS, DO;  Location: [facility];  Service: Gastroenterology;  Laterality: N/A;   EYE SURGERY Bilateral 2024   cataracts removed   FLEXIBLE SIGMOIDOSCOPY N/A 05/01/2023   Procedure: FLEXIBLE SIGMOIDOSCOPY;  Surgeon: Avram Lupita BRAVO, MD;  Location: [facility];  Service: Gastroenterology;  Laterality: N/A;   FLEXIBLE SIGMOIDOSCOPY N/A  08/17/2023   Procedure: FLEXIBLE SIGMOIDOSCOPY;  Surgeon: Rollin Dover, MD;  Location: [facility];  Service: Gastroenterology;  Laterality: N/A;   HOLMIUM LASER APPLICATION Left 01/20/2014   Procedure: HOLMIUM LASER APPLICATION;  Surgeon: Toribio Neysa Repine, MD;  Location: [facility];  Service: Urology;  Laterality: Left;   KNEE ARTHROSCOPY Left 06/29/2018   Procedure: ARTHROSCOPY KNEE;  Surgeon: Lucilla Lynwood BRAVO, MD;  [facility];  Service: Orthopedics;  Laterality: Left;   LAPAROSCOPIC GASTRIC SLEEVE RESECTION N/A 12/07/2012   Procedure: LAPAROSCOPIC GASTRIC SLEEVE RESECTION;  Surgeon: Redell Faith, DO;  Location: [facility];  Service: General;  Laterality: N/A;  laparoscopic sleeve gastrectomy with EGD   LUMBAR LAMINECTOMY N/A 11/17/2017   Procedure: L2-3 LAMINECTOMY AND REDO LAMINECTOMIES  L3-4, L4-5 AND L5-S1;  Surgeon: Lucilla Lynwood BRAVO, MD;  Location: [facility];  Service: Orthopedics;  Laterality: N/A;   LUMBAR WOUND DEBRIDEMENT N/A 06/29/2018   Procedure: LUMBAR WOUND DEBRIDEMENT DRAINAGE AND IRRIGATION; AND ASPIRATION OF LEFT KNEE;  Surgeon: Lucilla Lynwood BRAVO, MD;  Location: [facility];  Service: Orthopedics;  Laterality: N/A;   SAVORY DILATION N/A 11/26/2022   Procedure: SAVORY DILATION;  Surgeon: San Sandor GAILS, DO;  Location: [facility];  Service: Gastroenterology;  Laterality: N/A;   TEE WITHOUT CARDIOVERSION N/A 07/01/2018   Procedure: TRANSESOPHAGEAL ECHOCARDIOGRAM (TEE);  Surgeon: Rolan Ezra RAMAN, MD;  Location: [facility] END[facility];  Service: Cardiovascular;  Laterality: N/A;   Past Medical History:  Diagnosis Date   Anemia    Arthritis    Chronic kidney disease    HX acute kidney failure / acute pyelonephritis / hydronephrosis / severe sepsis per discharge summary 12/27/13   GERD (gastroesophageal reflux disease)    History of kidney stones    Hypertension    Morbid obesity (HCC)    Obstructive sleep apnea    does not need c pap since 110 lb wt loss   Osteoporosis    Prurigo 2002    Scars    ON ARMS FROM CHEMICAL EXPLOSION 1999   Spinal stenosis    BP (!) 188/105 (Patient Position: Sitting)   Pulse 67   Ht 5' 10 (1.778 m)   SpO2 95%   BMI 52.52 kg/m   Opioid Risk Score:   Fall Risk Score:  `1  Depression screen [facility] 2/9     03/04/2024   11:03 AM 03/04/2024   10:50 AM  Depression screen PHQ 2/9  Decreased Interest 1 0  Down, Depressed, Hopeless 0 0  PHQ - 2 Score 1 0  Altered sleeping 0   Tired, decreased energy 1   Change in appetite 0   Feeling bad or failure about yourself  1   Trouble concentrating 0   Moving slowly or fidgety/restless 0   Suicidal thoughts 0   PHQ-9 Score 3   Difficult doing work/chores Not difficult at all      Review of Systems  Musculoskeletal:  Positive for arthralgias, joint swelling and myalgias.       Hip pain, knee pain  Skin:  Positive for rash.       Skin rash/breakdown, easy bleeding  Neurological:  Positive for weakness.       Bladder control problems, bowel control problems, weakness, numbness, trouble walking  All other systems reviewed and are negative.      Objective:   Physical Exam  Gen: no distress, obese HEENT: oral mucosa pink and moist, NCAT Chest: normal effort, normal rate of breathing Abd: soft, non-distended Ext: no edema Psych: pleasant, normal affect Skin: intact Neuro: Alert and awake, follows commands, cranial nerves II through XII grossly intact, normal speech and language RUE: 5/5 Deltoid, 5/5 Biceps, 5/5 Triceps, 5/5 Wrist Ext, 5/5 Grip LUE: 4-/5 Deltoid, 5/5 Biceps, 5/5 Triceps, 5/5 Wrist Ext, 5/5 Grip RLE: HF 1/5, KE 1/5, ADF 1/5, APF 1/5 LLE: HF 2/5, KE 2/5, ADF 1-2/5, APF 2/5 Sensory exam intact light touch in bilateral upper extremities, decreased to absent in bilateral lower extremities  Musculoskeletal:  Decreased left shoulder active ROM, able to passively ABduct significantly above his head Left hand unable to extend digits 3 and 4 TTP right lumbar spine paraspinal  muscles Using a power wheelchair for mobility Bilateral knee joint line TTP  MRI L spine 04/30/23 EXAM: MRI LUMBAR SPINE WITHOUT AND WITH CONTRAST   TECHNIQUE: Multiplanar and multiecho pulse sequences of the lumbar spine were obtained without and with intravenous contrast.   CONTRAST:  10mL GADAVIST  GADOBUTROL  1 MMOL/ML IV SOLN   COMPARISON:  MRI of the lumbar spine November 03, 2018.   FINDINGS: Segmentation:  Standard.   Alignment: Exaggerated lumbar lordosis with small anterolisthesis of L4 over L5 and L5 over S1.   Vertebrae: No fracture, evidence of discitis, or bone lesion. Congenitally small spinal canal. L2-3, L3-4, L4-5 and L5-S1 laminectomies. Loss of disc height with partial endplate fusion at L4-5 and L5-S1. Prominent posterior element hypertrophy in the lower lumbar spine.   Conus medullaris and cauda equina: Conus extends to the L2 level. Conus appear normal.   Paraspinal and other soft tissues: Diffuse fatty atrophy of the paraspinal muscles.   Disc levels:   T12-L1: Shallow disc bulge and moderate hypertrophic facet degenerative changes resulting in mild spinal canal stenosis and mild bilateral neural foraminal narrowing.   L1-2: Shallow disc bulge and moderate hypertrophic facet degenerative changes resulting in mild spinal canal stenosis and severe bilateral neural foraminal narrowing.   L2-3: Disc bulge and prominent posterior element hypertrophy resulting in severe spinal canal stenosis and severe bilateral neural foraminal narrowing.   L3-4: Disc bulge and prominent posterior elements hypertrophy resulting in severe spinal canal stenosis, severe right and moderate left neural foraminal narrowing.   L4-5: Anterolisthesis, endplate ridging and posterior elements hypertrophy and epidural lipomatosis. Findings result in moderate to severe narrowing of the thecal sac, prominent narrowing of the bilateral subarticular zones, severe right and  moderate left neural foraminal narrowing.   L5-S1: Small anterolisthesis, endplate ridging and posterior element hypertrophy resulting in moderate bilateral neural foraminal narrowing. Mild spinal canal stenosis.   IMPRESSION: 1. Degenerative changes of the lumbar spine superimposed on a congenitally small spinal canal resulting in severe spinal canal stenosis at L2-3  and L3-4 and moderate to severe narrowing of the thecal sac at L4-5. 2. Severe bilateral neural foraminal narrowing at L1-2 and L2-3. 3. Severe right and moderate left neural foraminal narrowing at L3-4 and L4-5. 4. Moderate bilateral neural foraminal narrowing at L5-S1.  L knee xray 2019 No acute fracture.   Moderate to severe osteoarthritic change.  Joint effusion.    Assessment & Plan:  1) Spinal cord injury. He history of severe lumbar stenosis and prior lumbar surgery 2) History of  C spine ACDF 3) Morbid Obesity Body mass index is 52.52 kg/m. 4) Hx CKD, avoid nephrotoxic medications 5) Neurogenic bladder with Chronic Foley 6) Possible neurogenic bowel No acute fracture. 7) Bilateral knee pain likely due to osteoarthritis, left > right   Moderate to severe osteoarthritic change.  Joint effusion. 7) Chronic lower back pain   UDS and pain agreement today(check on completion) Consider continuation of tramadol  50 mg twice daily, advised call 1 week away from running Opioid risk tool low Discussed trying TENS unit for his lower back-do not place over insensate skin Order Nexwave  Will start gabapentin  100 mg 3 times daily Continue monitor bowel and bladder function Will schedule left knee cortisone injection next visit  04/06/24 called pt back last week and again today to f/u after further reviewing records, unfortunately will be nonopioid status due to UDS with hydrocodone , patient indicates he did get a pill from a friend previously.  Advised against using any medications not prescribed to him

## 2024-03-08 LAB — DRUG TOX MONITOR 1 W/CONF, ORAL FLD
Amphetamines: NEGATIVE ng/mL (ref <10)
Barbiturates: NEGATIVE ng/mL (ref <10)
Benzodiazepines: NEGATIVE ng/mL (ref <0.50)
Buprenorphine: NEGATIVE ng/mL (ref <0.10)
Cocaine: NEGATIVE ng/mL (ref <5.0)
Codeine: NEGATIVE ng/mL (ref <2.5)
Dihydrocodeine: NEGATIVE ng/mL (ref <2.5)
Fentanyl: NEGATIVE ng/mL (ref <0.10)
Heroin Metabolite: NEGATIVE ng/mL (ref <1.0)
Hydrocodone: 4.6 ng/mL — ABNORMAL HIGH (ref <2.5)
Hydromorphone: NEGATIVE ng/mL (ref <2.5)
MARIJUANA: NEGATIVE ng/mL (ref <2.5)
MDMA: NEGATIVE ng/mL (ref <10)
Meprobamate: NEGATIVE ng/mL (ref <2.5)
Methadone: NEGATIVE ng/mL (ref <5.0)
Morphine: NEGATIVE ng/mL (ref <2.5)
Nicotine Metabolite: NEGATIVE ng/mL (ref <5.0)
Norhydrocodone: NEGATIVE ng/mL (ref <2.5)
Noroxycodone: NEGATIVE ng/mL (ref <2.5)
Opiates: POSITIVE ng/mL — AB (ref <2.5)
Oxycodone: NEGATIVE ng/mL (ref <2.5)
Oxymorphone: NEGATIVE ng/mL (ref <2.5)
Phencyclidine: NEGATIVE ng/mL (ref <10)
Tapentadol: NEGATIVE ng/mL (ref <5.0)
Tramadol: >500 ng/mL — ABNORMAL HIGH (ref <5.0)
Tramadol: POSITIVE ng/mL — AB (ref <5.0)
Zolpidem: NEGATIVE ng/mL (ref <5.0)

## 2024-03-08 LAB — DRUG TOX ALC METAB W/CON, ORAL FLD: Alcohol Metabolite: NEGATIVE ng/mL (ref <25)

## 2024-03-15 ENCOUNTER — Telehealth: Payer: Self-pay

## 2024-03-15 NOTE — Telephone Encounter (Signed)
 Patient states he has been on Gabapentin  since last Monday and he believes he is having a reaction to the medication. He states he is losing strength in legs. Please advice in Dr. Rayleen Cal absence.

## 2024-03-16 NOTE — Telephone Encounter (Signed)
 Patient has been notified

## 2024-03-30 ENCOUNTER — Telehealth: Payer: Self-pay

## 2024-03-30 NOTE — Telephone Encounter (Signed)
 Pt called requesting a stronger pain medication. He is currently taking the tramadol  50 mg. Pt explained that the tramadol  is not relieving the pain. Please advise.

## 2024-04-01 NOTE — Telephone Encounter (Signed)
 Mr [name] [name] called again for medication. He knows his swab had hydrocodone  in it--reported it was from a friend but his PCP had prescribed him tramadol . He is also asking if he should take aleve with his gabapentin  to help with the pain.

## 2024-04-08 ENCOUNTER — Ambulatory Visit: Admitting: Family Medicine

## 2024-04-21 ENCOUNTER — Encounter: Payer: Self-pay | Admitting: Family Medicine

## 2024-04-21 ENCOUNTER — Ambulatory Visit (INDEPENDENT_AMBULATORY_CARE_PROVIDER_SITE_OTHER): Admitting: Family Medicine

## 2024-04-21 DIAGNOSIS — G8929 Other chronic pain: Secondary | ICD-10-CM

## 2024-04-21 DIAGNOSIS — I10 Essential (primary) hypertension: Secondary | ICD-10-CM | POA: Diagnosis not present

## 2024-04-21 DIAGNOSIS — M25561 Pain in right knee: Secondary | ICD-10-CM

## 2024-04-21 DIAGNOSIS — M48062 Spinal stenosis, lumbar region with neurogenic claudication: Secondary | ICD-10-CM

## 2024-04-21 DIAGNOSIS — Z978 Presence of other specified devices: Secondary | ICD-10-CM

## 2024-04-21 DIAGNOSIS — R748 Abnormal levels of other serum enzymes: Secondary | ICD-10-CM

## 2024-04-21 DIAGNOSIS — M25562 Pain in left knee: Secondary | ICD-10-CM

## 2024-04-21 MED ORDER — AMLODIPINE BESYLATE 5 MG PO TABS: 5.0000 mg | ORAL_TABLET | Freq: Every morning | ORAL | 3 refills | Status: AC

## 2024-04-21 NOTE — Progress Notes (Signed)
 Established Patient Office Visit   Subjective  Patient ID: [name] [name], male    DOB: [birth year]  Age: 68 y.o. MRN: [id number]  Chief Complaint  Patient presents with   Medical Management of Chronic Issues    3 month follow-up, patient would like to talk about weight loss and pain management    Pt accompanied by his wife. Patient is a 68 year old male seen for follow-up.  Patient states he is doing well overall.  Still dealing with chronic pain and back and knees.  Seen by pain management however advised would be on a non-opioid pain plan due to a UDS positive for opioids.  Patient states he was supplementing current pain med with a few pills of a friend's oxycodone .  Patient has a follow-up appointment with pain management on 22nd for knee injections.  Patient concerned about his weight.  Prior history of gastric sleeve.  Taking multivitamin daily.  Patient states he just started working with his care aide Nat on exercises/increasing movement.  No longer drinking alcohol.  Pt decreasing fast food intake.  Was eating mom's prepared meals.  Notes increasing intake of lean cuisine/frozen meals.  Drinking mostly water , [place]  green tea, and vitamin water .  Patient states he forgot to take Norvasc  5 mg this morning.  Per wife needs refills.  Patient states BP was 135/88 on Monday when checked by aide.  Also had indwelling catheter switched on Monday.    Patient Active Problem List   Diagnosis Date Noted   Ileus, postoperative (HCC) 08/19/2023   Abnormal x-ray of abdomen 08/18/2023   Chronic indwelling Foley catheter 04/30/2023   Ileus (HCC) 04/29/2023   Abdominal pain, epigastric 11/26/2022   Dyspepsia 11/26/2022   H/O gastric sleeve 11/26/2022   GERD without esophagitis 11/26/2022   Gastroesophageal reflux disease 10/22/2022   Screening for colorectal cancer 10/22/2022   Abdominal distension 10/22/2022   E-coli UTI 12/29/2018   Acute on chronic anemia    Radiculopathy  12/10/2018   Cervical myelopathy (HCC) 12/07/2018   Microcytic hypochromic anemia 08/27/2018   At risk for adverse drug event 08/10/2018   Chronic pain syndrome    Labile blood pressure    Urinary retention    Hypokalemia    Hypertension    Acute blood loss anemia    Anemia of chronic disease    Abscess in epidural space of lumbar spine    Effusion, left knee    Pain and swelling of wrist, left    Complete tear of left rotator cuff 07/03/2018    Class: Chronic   AKI (acute kidney injury) (HCC)    MSSA bacteremia    Septic arthritis of knee, left (HCC) 06/30/2018    Class: Acute   Leukocytosis    Postoperative seroma involving nervous system after nervous system procedure    Shoulder pain, bilateral    Thoracic myelopathy 06/28/2018   Spondylosis, thoracic, with myelopathy 06/28/2018    Class: Acute   Abscess in epidural space of L2-L5 lumbar spine 06/28/2018    Class: Acute   Acute urinary retention 06/28/2018   Left cervical radiculopathy 06/28/2018    Class: Acute   Spinal stenosis of lumbar region 11/17/2017    Class: Chronic   Status post lumbar laminectomy 11/17/2017   Lumbar stenosis with neurogenic claudication 08/18/2017   Forestier's disease of thoracolumbar region 08/18/2017   Degenerative disc disease, lumbar 08/18/2017   Lumbar radiculopathy 09/30/2016   Morbid (severe) obesity due to excess calories (HCC) 09/30/2016   Biliary  calculus with acute cholecystitis 03/21/2016   Acute pyelonephritis 12/28/2013   Ureteral stone with hydronephrosis 12/28/2013   Acute kidney failure (HCC) 12/27/2013   Severe sepsis with acute organ dysfunction (HCC) 12/27/2013   Hypotension 12/27/2013   Lactic acidosis 12/27/2013   Sepsis secondary to UTI (HCC) 12/27/2013   Empyema of pleural space (HCC) 08/30/2008   PRURIGO 08/30/2008   Sleep apnea 08/30/2008   Past Medical History:  Diagnosis Date   Anemia    Arthritis    Chronic kidney disease    HX acute kidney failure /  acute pyelonephritis / hydronephrosis / severe sepsis per discharge summary 12/27/13   GERD (gastroesophageal reflux disease)    History of kidney stones    Hypertension    Morbid obesity (HCC)    Obstructive sleep apnea    does not need c pap since 110 lb wt loss   Osteoporosis    Prurigo 2002   Scars    ON ARMS FROM CHEMICAL EXPLOSION 1999   Spinal stenosis    Past Surgical History:  Procedure Laterality Date   ANTERIOR CERVICAL DECOMP/DISCECTOMY FUSION N/A 12/07/2018   Procedure: ANTERIOR CERVICAL DECOMPRESSION FUSION - CERVICAL THREE-CERVICAL FOUR, CERVICAL FOUR-CERVICAL FIVE, CERVICAL FIVE-CERVICAL SIX;  Surgeon: Louis Shove, MD;  Location: [facility];  Service: Neurosurgery;  Laterality: N/A;   BACK SURGERY  01/23/2010   lumbar   BIOPSY  11/26/2022   Procedure: BIOPSY;  Surgeon: San Sandor GAILS, DO;  Location: [facility];  Service: Gastroenterology;;   CHOLECYSTECTOMY N/A 03/24/2016   Procedure: LAPAROSCOPIC CHOLECYSTECTOMY WITH INTRAOPERATIVE CHOLANGIOGRAM;  Surgeon: Deward Null III, MD;  Location: [facility];  Service: General;  Laterality: N/A;   CIRCUMCISION     CYSTOSCOPY WITH RETROGRADE PYELOGRAM, URETEROSCOPY AND STENT PLACEMENT Left 01/20/2014   Procedure: CYSTOSCOPY WITH RETROGRADE PYELOGRAM, URETEROSCOPY AND STENT EXCHANGE;  Surgeon: Toribio Neysa Repine, MD;  Location: [facility];  Service: Urology;  Laterality: Left;  bugbee bladder fulguration   CYSTOSCOPY WITH STENT PLACEMENT Left 12/28/2013   Procedure: CYSTOSCOPY WITH STENT PLACEMENT left retrograde pyleogram;  Surgeon: Toribio Neysa Repine, MD;  Location: [facility];  Service: Urology;  Laterality: Left;   CYSTOSCOPY/URETEROSCOPY/HOLMIUM LASER/STENT PLACEMENT Right 05/15/2021   Procedure: CYSTOSCOPY RIGHT RETROGRADE RIGHT URETEROSCOPY/HOLMIUM LASER/STENT PLACEMENT;  Surgeon: Watt Rush, MD;  Location: [facility];  Service: Urology;  Laterality: Right;   CYSTOSCOPY/URETEROSCOPY/HOLMIUM LASER/STENT PLACEMENT Right 07/08/2023    Procedure: CYSTOSCOPY, RIGHT RETROGRADE PYELOGRAM, RIGHT URETERAL STENT PLACEMENT;  Surgeon: Watt Rush, MD;  Location: [facility];  Service: Urology;  Laterality: Right;  60 MINS FOR CASE   CYSTOSCOPY/URETEROSCOPY/HOLMIUM LASER/STENT PLACEMENT Right 08/12/2023   Procedure: CYSTOSCOPY RIGHT   URETEROSCOPY/HOLMIUM LASER/STENT EXCHANGE;  Surgeon: Watt Rush, MD;  Location: [facility];  Service: Urology;  Laterality: Right;   ESOPHAGOGASTRODUODENOSCOPY N/A 12/07/2012   Procedure: ESOPHAGOGASTRODUODENOSCOPY (EGD);  Surgeon: Redell Faith, DO;  Location: [facility];  Service: General;  Laterality: N/A;   ESOPHAGOGASTRODUODENOSCOPY (EGD) WITH PROPOFOL  N/A 11/26/2022   Procedure: ESOPHAGOGASTRODUODENOSCOPY (EGD) WITH PROPOFOL ;  Surgeon: San Sandor GAILS, DO;  Location: [facility];  Service: Gastroenterology;  Laterality: N/A;   EYE SURGERY Bilateral 2024   cataracts removed   FLEXIBLE SIGMOIDOSCOPY N/A 05/01/2023   Procedure: FLEXIBLE SIGMOIDOSCOPY;  Surgeon: Avram Lupita BRAVO, MD;  Location: [facility];  Service: Gastroenterology;  Laterality: N/A;   FLEXIBLE SIGMOIDOSCOPY N/A 08/17/2023   Procedure: FLEXIBLE SIGMOIDOSCOPY;  Surgeon: Rollin Dover, MD;  Location: [facility];  Service: Gastroenterology;  Laterality: N/A;   HOLMIUM LASER APPLICATION Left 01/20/2014   Procedure: HOLMIUM  LASER APPLICATION;  Surgeon: Toribio Neysa Repine, MD;  Location: [facility];  Service: Urology;  Laterality: Left;   KNEE ARTHROSCOPY Left 06/29/2018   Procedure: ARTHROSCOPY KNEE;  Surgeon: Lucilla Lynwood BRAVO, MD;  Location: [facility] OR;  Service: Orthopedics;  Laterality: Left;   LAPAROSCOPIC GASTRIC SLEEVE RESECTION N/A 12/07/2012   Procedure: LAPAROSCOPIC GASTRIC SLEEVE RESECTION;  Surgeon: Redell Faith, DO;  Location: [facility];  Service: General;  Laterality: N/A;  laparoscopic sleeve gastrectomy with EGD   LUMBAR LAMINECTOMY N/A 11/17/2017   Procedure: L2-3 LAMINECTOMY AND REDO LAMINECTOMIES  L3-4, L4-5 AND L5-S1;  Surgeon: Lucilla Lynwood BRAVO, MD;  Location: [facility];  Service: Orthopedics;  Laterality: N/A;   LUMBAR WOUND DEBRIDEMENT N/A 06/29/2018   Procedure: LUMBAR WOUND DEBRIDEMENT DRAINAGE AND IRRIGATION; AND ASPIRATION OF LEFT KNEE;  Surgeon: Lucilla Lynwood BRAVO, MD;  Location: [facility];  Service: Orthopedics;  Laterality: N/A;   SAVORY DILATION N/A 11/26/2022   Procedure: SAVORY DILATION;  Surgeon: San Sandor GAILS, DO;  Location: [facility];  Service: Gastroenterology;  Laterality: N/A;   TEE WITHOUT CARDIOVERSION N/A 07/01/2018   Procedure: TRANSESOPHAGEAL ECHOCARDIOGRAM (TEE);  Surgeon: Rolan Ezra RAMAN, MD;  Location: [facility];  Service: Cardiovascular;  Laterality: N/A;   Social History   Tobacco Use   Smoking status: Former    Current packs/day: 0.00    Types: Cigarettes    Quit date: 04/09/1974    Years since quitting: 50.0   Smokeless tobacco: Never  Vaping Use   Vaping status: Never Used  Substance Use Topics   Alcohol use: No   Drug use: No   Family History  Problem Relation Age of Onset   Diabetes Mother    Heart disease Father    Colon cancer Neg Hx    Colon polyps Neg Hx    Esophageal cancer Neg Hx    Rectal cancer Neg Hx    Stomach cancer Neg Hx    Allergies  Allergen Reactions   Unasyn [Ampicillin-Sulbactam Sodium] Hives, Itching, Swelling and Rash    ROS Negative unless stated above    Objective:     BP (!) 186/120 (BP Location: Right Arm, Patient Position: Sitting, Cuff Size: Large) Comment: patient did not take Blood pressure meds today  Pulse 61   Temp 98.6 F (37 C) (Oral)   Ht 5' 10 (1.778 m)   Wt (!) 396 lb (179.6 kg) Comment: per patient at home  SpO2 95%   BMI 56.82 kg/m  BP Readings from Last 3 Encounters:  04/21/24 (!) 186/120  03/04/24 (!) 188/105  01/08/24 134/84   Wt Readings from Last 3 Encounters:  04/21/24 (!) 396 lb (179.6 kg)  08/17/23 (!) 366 lb (166 kg)  08/12/23 (!) 356 lb (161.5 kg)      Physical Exam Constitutional:      General: He is not in  acute distress.    Appearance: Normal appearance. He is obese.  HENT:     Head: Normocephalic and atraumatic.     Nose: Nose normal.     Mouth/Throat:     Mouth: Mucous membranes are moist.  Cardiovascular:     Rate and Rhythm: Normal rate and regular rhythm.     Heart sounds: Normal heart sounds. No murmur heard.    No gallop.  Pulmonary:     Effort: Pulmonary effort is normal. No respiratory distress.     Breath sounds: Normal breath sounds. No wheezing, rhonchi or rales.  Skin:    General: Skin is warm  and dry.  Neurological:     Mental Status: He is alert and oriented to person, place, and time.     Comments: Gait not addressed this patient sitting in motorized wheelchair.        03/04/2024   11:03 AM 03/04/2024   10:50 AM  Depression screen PHQ 2/9  Decreased Interest 1 0  Down, Depressed, Hopeless 0 0  PHQ - 2 Score 1 0  Altered sleeping 0   Tired, decreased energy 1   Change in appetite 0   Feeling bad or failure about yourself  1   Trouble concentrating 0   Moving slowly or fidgety/restless 0   Suicidal thoughts 0   PHQ-9 Score 3   Difficult doing work/chores Not difficult at all        No data to display           No results found for any visits on 04/21/24.    Assessment & Plan:   Morbid obesity (HCC) -     Amb Ref to Medical Weight Management  Essential hypertension -     amLODIPine  Besylate; Take 1 tablet (5 mg total) by mouth in the morning.  Dispense: 90 tablet; Refill: 3  Spinal stenosis of lumbar region with neurogenic claudication  Chronic indwelling Foley catheter  Chronic pain of both knees  BMI 56.82 kg/m.  Discussed the importance of increasing movement and making changes to diet.  Patient with interest in weight loss medication.  Prior history of gastric sleeve.  Referral placed to weight management.  Advised to make consistent changes on his own for at least 3 months.  Chair exercises advised.    BP uncontrolled, hypertensive  urgency.  Patient asymptomatic.  Advised to restart Norvasc  5 mg daily.  Patient to check BP at home and keep a log to bring with him to clinic.  Advised that may need further BP medication adjustments/additions.  Reviewed possible consequences of uncontrolled HTN.  Patient advised to keep follow-up appointment with pain management.  Advised would be on a nonopioid pain management plan due to UDS positive for opioids that were not prescribed.  No follow-ups on file.   Clotilda JONELLE Single, MD

## 2024-04-26 ENCOUNTER — Encounter (INDEPENDENT_AMBULATORY_CARE_PROVIDER_SITE_OTHER): Payer: Self-pay

## 2024-04-27 ENCOUNTER — Telehealth: Payer: Self-pay | Admitting: *Deleted

## 2024-04-27 NOTE — Telephone Encounter (Signed)
 [name] [name] called with a request for a non opioid pain medication.

## 2024-04-29 ENCOUNTER — Encounter (INDEPENDENT_AMBULATORY_CARE_PROVIDER_SITE_OTHER): Payer: Self-pay

## 2024-05-04 ENCOUNTER — Encounter: Attending: Physical Medicine & Rehabilitation | Admitting: Physical Medicine & Rehabilitation

## 2024-05-04 ENCOUNTER — Encounter: Payer: Self-pay | Admitting: Physical Medicine & Rehabilitation

## 2024-05-04 VITALS — BP 144/84 | HR 86

## 2024-05-04 DIAGNOSIS — M48062 Spinal stenosis, lumbar region with neurogenic claudication: Secondary | ICD-10-CM | POA: Diagnosis present

## 2024-05-04 DIAGNOSIS — G894 Chronic pain syndrome: Secondary | ICD-10-CM | POA: Diagnosis present

## 2024-05-04 DIAGNOSIS — N189 Chronic kidney disease, unspecified: Secondary | ICD-10-CM | POA: Diagnosis not present

## 2024-05-04 MED ORDER — TRIAMCINOLONE ACETONIDE 40 MG/ML IJ SUSP
40.0000 mg | Freq: Once | INTRAMUSCULAR | Status: AC
  Administered 2024-05-04: 40 mg via INTRAMUSCULAR

## 2024-05-04 MED ORDER — SUZETRIGINE 50 MG PO TABS: 50.0000 mg | ORAL_TABLET | Freq: Two times a day (BID) | ORAL | 0 refills | Status: DC | PRN

## 2024-05-04 MED ORDER — GABAPENTIN 100 MG PO CAPS: 100.0000 mg | ORAL_CAPSULE | Freq: Two times a day (BID) | ORAL | 3 refills | Status: AC

## 2024-05-04 MED ORDER — GABAPENTIN 100 MG PO CAPS: 100.0000 mg | ORAL_CAPSULE | Freq: Two times a day (BID) | ORAL | 3 refills | Status: DC

## 2024-05-04 MED ORDER — LIDOCAINE HCL 1 % IJ SOLN
1.0000 mL | Freq: Once | INTRAMUSCULAR | Status: AC
  Administered 2024-05-04: 1 mL via INTRADERMAL

## 2024-05-04 NOTE — Progress Notes (Signed)
 Subjective:    Patient ID: [name] [name], male    DOB: [birth year], 68 y.o.   MRN: [id number]  HPI  HPI  Torion Hulgan is a 68 y.o. year old male  who  has a past medical history of Anemia, Arthritis, Chronic kidney disease, GERD (gastroesophageal reflux disease), History of kidney stones, Hypertension, Morbid obesity (HCC), Obstructive sleep apnea, Osteoporosis, Prurigo (2002), Scars, and Spinal stenosis.   They are presenting to PM&R clinic as a new patient for pain management evaluation. They were referred by Dr. Clotilda Single MD for treatment of spinal stenosis pain.  Back pain has been present for over 10 years.  He played football when he was younger and this this contributed. He saw Dr. Lucilla in the past, patient has a history of lumbar multilevel laminectomy for recurrent lumbar spinal stenosis, in 2019. He reports that he was walking with cane or walker at this time. Around this time he began not taking the best care of himself.  He started drinking more alcohol and gained weight.   He stopped exercising around this time. He developed worsening weakness in his legs greater than arms.  He later had neck surgery with ACDF C3-C6 completed by Dr. Malcolm 12/07/2018 and completed rehab again.  Patient had continued worsening of the weakness in his legs and has been nonambulatory for several years.  He now has a chronic indwelling Foley catheter, reports this was placed for prostate issues.  Denies any difficulty with bowel function but chart review indicates he has had issues with ileus/atonic colon in the past.  Patient was also seen by Dr. Vernetta for knee osteoarthritis previously.  Patient reports knees hurt more laying in bed and sitting in his wheelchair.  He has pain in his right lower back going down his legs.  Patient reports tramadol  50 mg twice daily overall is keeping his pain under good control.   Red flag symptoms: No red flags for back pain endorsed in Hx or ROS, saddle  anesthesia, loss of bowel or bladder continence, new weakness, new numbness/tingling, and pain waking up at nighttime.  Medications tried: Topical medications- Hemp cream - did not help much- used today Voltaren  helps a little  Nsaids  Aleve- doesn't help much  Tylenol   doesn't help much  Opiates  Tramadol  helps his pain  Oxycodone  years ago- last used after neck surgery  Gabapentin  / Lyrica  - Denies  TCAs -Denies  SNRIs  - Denies     Other treatments: PT/OT - CIR in 2020, just started PT, session today planned TENs unit - Denies  Injections- Years ago had ESI? - Helped just a little  Surgery - L spine and C spine surgery  Goals for pain control:  Be a little more comfortable overall    Prior UDS results: No results found for: LABOPIA, COCAINSCRNUR, LABBENZ, AMPHETMU, THCU, LABBARB   Interval history 05/04/2024  Patient is here for left knee injection.  Continues to have pain in his left knee, it is worsened after transfers or movements of his left knee.  He does report benefit to his pain with gabapentin .  He was unable to tolerate taking it 3 times a day.  He does tolerate taking it twice a day and it helps with this pain.  Patient continues to have episodes of pain flares of worsening pain in his back and legs.  Pain Inventory Average Pain 9 Pain Right Now 10 My pain is sharp, dull, and aching  In the  last 24 hours, has pain interfered with the following? General activity 10 Relation with others 7 Enjoyment of life 8 What TIME of day is your pain at its worst? night Sleep (in general) Fair  Pain is worse with: sitting and inactivity Pain improves with: medication, heat, anti-inflammatory medication, Voltaren  Cream Relief from Meds: 10  use a wheelchair  disabled: date disabled 15 years ago I need assistance with the following:  dressing, bathing, and toileting, need help with transfers  Not able to climb steps do not drive Do not have any  goals  Exercise 1 day per week for an hour to work on strength  bladder control problems bowel control problems weakness numbness trouble walking  Any changes since last visit?  no  Primary care PCP Clotilda Single, MD15 years ago    Family History  Problem Relation Age of Onset   Diabetes Mother    Heart disease Father    Colon cancer Neg Hx    Colon polyps Neg Hx    Esophageal cancer Neg Hx    Rectal cancer Neg Hx    Stomach cancer Neg Hx    Social History   Socioeconomic History   Marital status: Married    Spouse name: Not on file   Number of children: Not on file   Years of education: Not on file   Highest education level: Not on file  Occupational History   Not on file  Tobacco Use   Smoking status: Former    Current packs/day: 0.00    Types: Cigarettes    Quit date: 04/09/1974    Years since quitting: 50.1   Smokeless tobacco: Never  Vaping Use   Vaping status: Never Used  Substance and Sexual Activity   Alcohol use: No   Drug use: No   Sexual activity: Not Currently    Birth control/protection: None  Other Topics Concern   Not on file  Social History Narrative   Not on file   Social Drivers of Health   Financial Resource Strain: Not on file  Food Insecurity: No Food Insecurity (08/17/2023)   Hunger Vital Sign    Worried About Running Out of Food in the Last Year: Never true    Ran Out of Food in the Last Year: Never true  Transportation Needs: No Transportation Needs (08/17/2023)   PRAPARE - Administrator, Civil Service (Medical): No    Lack of Transportation (Non-Medical): No  Physical Activity: Not on file  Stress: Not on file  Social Connections: Not on file   Past Surgical History:  Procedure Laterality Date   ANTERIOR CERVICAL DECOMP/DISCECTOMY FUSION N/A 12/07/2018   Procedure: ANTERIOR CERVICAL DECOMPRESSION FUSION - CERVICAL THREE-CERVICAL FOUR, CERVICAL FOUR-CERVICAL FIVE, CERVICAL FIVE-CERVICAL SIX;  Surgeon: Louis Shove, MD;  Location: [facility];  Service: Neurosurgery;  Laterality: N/A;   BACK SURGERY  01/23/2010   lumbar   BIOPSY  11/26/2022   Procedure: BIOPSY;  Surgeon: San Sandor GAILS, DO;  Location: [facility];  Service: Gastroenterology;;   CHOLECYSTECTOMY N/A 03/24/2016   Procedure: LAPAROSCOPIC CHOLECYSTECTOMY WITH INTRAOPERATIVE CHOLANGIOGRAM;  Surgeon: Deward Null III, MD;  Location: [facility];  Service: General;  Laterality: N/A;   CIRCUMCISION     CYSTOSCOPY WITH RETROGRADE PYELOGRAM, URETEROSCOPY AND STENT PLACEMENT Left 01/20/2014   Procedure: CYSTOSCOPY WITH RETROGRADE PYELOGRAM, URETEROSCOPY AND STENT EXCHANGE;  Surgeon: Toribio Neysa Repine, MD;  Location: [facility];  Service: Urology;  Laterality: Left;  bugbee bladder fulguration   CYSTOSCOPY WITH  STENT PLACEMENT Left 12/28/2013   Procedure: CYSTOSCOPY WITH STENT PLACEMENT left retrograde pyleogram;  Surgeon: Toribio Neysa Repine, MD;  Location: [facility];  Service: Urology;  Laterality: Left;   CYSTOSCOPY/URETEROSCOPY/HOLMIUM LASER/STENT PLACEMENT Right 05/15/2021   Procedure: CYSTOSCOPY RIGHT RETROGRADE RIGHT URETEROSCOPY/HOLMIUM LASER/STENT PLACEMENT;  Surgeon: Watt Rush, MD;  Location: [facility];  Service: Urology;  Laterality: Right;   CYSTOSCOPY/URETEROSCOPY/HOLMIUM LASER/STENT PLACEMENT Right 07/08/2023   Procedure: CYSTOSCOPY, RIGHT RETROGRADE PYELOGRAM, RIGHT URETERAL STENT PLACEMENT;  Surgeon: Watt Rush, MD;  Location: [facility];  Service: Urology;  Laterality: Right;  60 MINS FOR CASE   CYSTOSCOPY/URETEROSCOPY/HOLMIUM LASER/STENT PLACEMENT Right 08/12/2023   Procedure: CYSTOSCOPY RIGHT   URETEROSCOPY/HOLMIUM LASER/STENT EXCHANGE;  Surgeon: Watt Rush, MD;  Location: [facility];  Service: Urology;  Laterality: Right;   ESOPHAGOGASTRODUODENOSCOPY N/A 12/07/2012   Procedure: ESOPHAGOGASTRODUODENOSCOPY (EGD);  Surgeon: Redell Faith, DO;  Location: [facility];  Service: General;  Laterality: N/A;   ESOPHAGOGASTRODUODENOSCOPY (EGD) WITH PROPOFOL   N/A 11/26/2022   Procedure: ESOPHAGOGASTRODUODENOSCOPY (EGD) WITH PROPOFOL ;  Surgeon: San Sandor GAILS, DO;  Location: [facility];  Service: Gastroenterology;  Laterality: N/A;   EYE SURGERY Bilateral 2024   cataracts removed   FLEXIBLE SIGMOIDOSCOPY N/A 05/01/2023   Procedure: FLEXIBLE SIGMOIDOSCOPY;  Surgeon: Avram Lupita BRAVO, MD;  Location: [facility];  Service: Gastroenterology;  Laterality: N/A;   FLEXIBLE SIGMOIDOSCOPY N/A 08/17/2023   Procedure: FLEXIBLE SIGMOIDOSCOPY;  Surgeon: Rollin Dover, MD;  Location: [facility];  Service: Gastroenterology;  Laterality: N/A;   HOLMIUM LASER APPLICATION Left 01/20/2014   Procedure: HOLMIUM LASER APPLICATION;  Surgeon: Toribio Neysa Repine, MD;  Location: [facility];  Service: Urology;  Laterality: Left;   KNEE ARTHROSCOPY Left 06/29/2018   Procedure: ARTHROSCOPY KNEE;  Surgeon: Lucilla Lynwood BRAVO, MD;  Location: [facility];  Service: Orthopedics;  Laterality: Left;   LAPAROSCOPIC GASTRIC SLEEVE RESECTION N/A 12/07/2012   Procedure: LAPAROSCOPIC GASTRIC SLEEVE RESECTION;  Surgeon: Redell Faith, DO;  Location: [facility];  Service: General;  Laterality: N/A;  laparoscopic sleeve gastrectomy with EGD   LUMBAR LAMINECTOMY N/A 11/17/2017   Procedure: L2-3 LAMINECTOMY AND REDO LAMINECTOMIES  L3-4, L4-5 AND L5-S1;  Surgeon: Lucilla Lynwood BRAVO, MD;  Location: [facility];  Service: Orthopedics;  Laterality: N/A;   LUMBAR WOUND DEBRIDEMENT N/A 06/29/2018   Procedure: LUMBAR WOUND DEBRIDEMENT DRAINAGE AND IRRIGATION; AND ASPIRATION OF LEFT KNEE;  Surgeon: Lucilla Lynwood BRAVO, MD;  Location: [facility];  Service: Orthopedics;  Laterality: N/A;   SAVORY DILATION N/A 11/26/2022   Procedure: SAVORY DILATION;  Surgeon: San Sandor GAILS, DO;  Location: [facility];  Service: Gastroenterology;  Laterality: N/A;   TEE WITHOUT CARDIOVERSION N/A 07/01/2018   Procedure: TRANSESOPHAGEAL ECHOCARDIOGRAM (TEE);  Surgeon: Rolan Ezra RAMAN, MD;  Location: [facility];  Service: Cardiovascular;   Laterality: N/A;   Past Medical History:  Diagnosis Date   Anemia    Arthritis    Chronic kidney disease    HX acute kidney failure / acute pyelonephritis / hydronephrosis / severe sepsis per discharge summary 12/27/13   GERD (gastroesophageal reflux disease)    History of kidney stones    Hypertension    Morbid obesity (HCC)    Obstructive sleep apnea    does not need c pap since 110 lb wt loss   Osteoporosis    Prurigo 2002   Scars    ON ARMS FROM CHEMICAL EXPLOSION 1999   Spinal stenosis    BP (!) 159/92 (Patient Position: Sitting, Cuff Size: Large)   Pulse 86   SpO2 94%  Opioid Risk Score:   Fall Risk Score:  `1  Depression screen [facility] 2/9     04/21/2024    4:27 PM 03/04/2024   11:03 AM 03/04/2024   10:50 AM  Depression screen PHQ 2/9  Decreased Interest 1 1 0  Down, Depressed, Hopeless 0 0 0  PHQ - 2 Score 1 1 0  Altered sleeping 0 0   Tired, decreased energy 1 1   Change in appetite 1 0   Feeling bad or failure about yourself  1 1   Trouble concentrating 0 0   Moving slowly or fidgety/restless 0 0   Suicidal thoughts 1 0   PHQ-9 Score 5 3   Difficult doing work/chores  Not difficult at all      Review of Systems  Musculoskeletal:  Positive for arthralgias, joint swelling and myalgias.       Hip pain, knee pain  Skin:  Positive for rash.       Skin rash/breakdown, easy bleeding  Neurological:  Positive for weakness.       Bladder control problems, bowel control problems, weakness, numbness, trouble walking  All other systems reviewed and are negative.      Objective:   Physical Exam  Gen: no distress, obese HEENT: oral mucosa pink and moist, NCAT Chest: normal effort, normal rate of breathing Abd: soft, non-distended Ext: no edema Psych: pleasant, normal affect Skin: intact Neuro: Alert and awake, follows commands, cranial nerves II through XII grossly intact, normal speech and language RUE: 5/5 Deltoid, 5/5 Biceps, 5/5 Triceps, 5/5 Wrist Ext,  5/5 Grip LUE: 4-/5 Deltoid, 5/5 Biceps, 5/5 Triceps, 5/5 Wrist Ext, 5/5 Grip RLE: HF 1/5, KE 1/5, ADF 1/5, APF 1/5 LLE: HF 2/5, KE 2/5, ADF 1-2/5, APF 2/5 Sensory exam intact light touch in bilateral upper extremities, decreased to absent in bilateral lower extremities  Musculoskeletal:  Mild joint line TTP left knee, no signs of infection or rash around his knee   Prior exam Decreased left shoulder active ROM, able to passively ABduct significantly above his head Left hand unable to extend digits 3 and 4 TTP right lumbar spine paraspinal muscles Using a power wheelchair for mobility Bilateral knee joint line TTP  MRI L spine 04/30/23 EXAM: MRI LUMBAR SPINE WITHOUT AND WITH CONTRAST   TECHNIQUE: Multiplanar and multiecho pulse sequences of the lumbar spine were obtained without and with intravenous contrast.   CONTRAST:  10mL GADAVIST  GADOBUTROL  1 MMOL/ML IV SOLN   COMPARISON:  MRI of the lumbar spine November 03, 2018.   FINDINGS: Segmentation:  Standard.   Alignment: Exaggerated lumbar lordosis with small anterolisthesis of L4 over L5 and L5 over S1.   Vertebrae: No fracture, evidence of discitis, or bone lesion. Congenitally small spinal canal. L2-3, L3-4, L4-5 and L5-S1 laminectomies. Loss of disc height with partial endplate fusion at L4-5 and L5-S1. Prominent posterior element hypertrophy in the lower lumbar spine.   Conus medullaris and cauda equina: Conus extends to the L2 level. Conus appear normal.   Paraspinal and other soft tissues: Diffuse fatty atrophy of the paraspinal muscles.   Disc levels:   T12-L1: Shallow disc bulge and moderate hypertrophic facet degenerative changes resulting in mild spinal canal stenosis and mild bilateral neural foraminal narrowing.   L1-2: Shallow disc bulge and moderate hypertrophic facet degenerative changes resulting in mild spinal canal stenosis and severe bilateral neural foraminal narrowing.   L2-3: Disc bulge and  prominent posterior element hypertrophy resulting in severe spinal canal stenosis and severe  bilateral neural foraminal narrowing.   L3-4: Disc bulge and prominent posterior elements hypertrophy resulting in severe spinal canal stenosis, severe right and moderate left neural foraminal narrowing.   L4-5: Anterolisthesis, endplate ridging and posterior elements hypertrophy and epidural lipomatosis. Findings result in moderate to severe narrowing of the thecal sac, prominent narrowing of the bilateral subarticular zones, severe right and moderate left neural foraminal narrowing.   L5-S1: Small anterolisthesis, endplate ridging and posterior element hypertrophy resulting in moderate bilateral neural foraminal narrowing. Mild spinal canal stenosis.   IMPRESSION: 1. Degenerative changes of the lumbar spine superimposed on a congenitally small spinal canal resulting in severe spinal canal stenosis at L2-3 and L3-4 and moderate to severe narrowing of the thecal sac at L4-5. 2. Severe bilateral neural foraminal narrowing at L1-2 and L2-3. 3. Severe right and moderate left neural foraminal narrowing at L3-4 and L4-5. 4. Moderate bilateral neural foraminal narrowing at L5-S1.  L knee xray 2019 No acute fracture.   Moderate to severe osteoarthritic change.  Joint effusion.    Assessment & Plan:  1) Spinal cord injury. He history of severe lumbar stenosis and prior lumbar surgery 2) History of  C spine ACDF 3) Morbid Obesity There is no height or weight on file to calculate BMI. 4) Hx CKD, avoid nephrotoxic medications  - Creatinine 0.65/BUN 20 on 01/08/2024. 5) Neurogenic bladder with Chronic Foley 6) Possible neurogenic bowel No acute fracture. 7) Bilateral knee pain likely due to osteoarthritis, left > right   Moderate to severe osteoarthritic change.  Joint effusion. 7) Chronic lower back pain  Currently he is nonopioid status at this clinic.  UDS showed hydrocodone  and this was  inconsistent Opioid risk tool low Discussed TENS unit at prior visit, nexwave was ordered Adjust gabapentin  to 100 mg twice daily, he was not able to tolerate 3 times a day Continue monitor bowel and bladder function Will schedule left knee cortisone injection next visit Patient denies history of diabetes or liver dysfunction We will start Journavx  for acute pain episodes.   Left knee injection   Indication:Knee pain not relieved by medication management and other conservative care.  Informed consent was obtained after describing risks and benefits of the procedure with the patient, this includes bleeding, bruising, infection and medication side effects. The patient wishes to proceed and has given written consent. The patient was placed in a recumbent position. The Lateral aspect of the knee was marked and prepped with Betadine  and alcohol. It was then entered with a 21-gauge 1-1/2 inch needle.  After negative draw back for blood, a solution containing one ML of 40mg  per mL kenalog  and 1 mL of 1% lidocaine  were injected. The patient tolerated the procedure well. Post procedure instructions were given.

## 2024-05-20 ENCOUNTER — Telehealth: Payer: Self-pay | Admitting: Physical Medicine & Rehabilitation

## 2024-05-20 NOTE — Telephone Encounter (Signed)
 Journavx  was sent to pharmacy [facility] at [facility]. The wife said they need more information on this.  Please call [phone].

## 2024-06-08 ENCOUNTER — Encounter: Payer: Self-pay | Admitting: Family Medicine

## 2024-06-08 ENCOUNTER — Ambulatory Visit (INDEPENDENT_AMBULATORY_CARE_PROVIDER_SITE_OTHER): Admitting: Family Medicine

## 2024-06-08 DIAGNOSIS — Z0000 Encounter for general adult medical examination without abnormal findings: Secondary | ICD-10-CM | POA: Diagnosis not present

## 2024-06-08 NOTE — Progress Notes (Signed)
 PATIENT CHECK-IN and HEALTH RISK ASSESSMENT QUESTIONNAIRE:  -completed by phone/video for upcoming Medicare Preventive Visit  Pre-Visit Check-in: 1)Vitals (height, wt, BP, etc) - record in vitals section for visit on day of visit Request home vitals (wt, BP, etc.) and enter into vitals, THEN update Vital Signs SmartPhrase below at the top of the HPI. See below.  2)Review and Update Medications, Allergies PMH, Surgeries, Social history in Epic 3)Hospitalizations in the last year with date/reason? NO   4)Review and Update Care Team (patient's specialists) in Epic 5) Complete PHQ9 in Epic  6) Complete Fall Screening in Epic 7)Review all Health Maintenance Due and order if not done.  Medicare Wellness Patient Questionnaire:  Answer theses question about your habits: How often do you have a drink containing alcohol?NO  How many drinks containing alcohol do you have on a typical day when you are drinking?Na  How often do you have six or more drinks on one occasion?Na  Have you ever smoked?NO  Quit date if applicable? NA   How many packs a day do/did you smoke? NA  Do you use smokeless tobacco?NO  Do you use an illicit drugs?No  On average, how many days per week do you engage in moderate to strenuous exercise (like a brisk walk)?daily On average, how many minutes do you engage in exercise at this level? Doing about 1 hour per daily of weights and exercise bands  Are you sexually active? NO Number of partners?NA  Typical breakfast: eggs sausage toast, smoothie  Typical lunch:sandwich  Typical dinner:meat, Vegetables  Typical snacks: jello, yogurt   Beverages: coffee, water  Bodyamor   Answer theses question about your everyday activities: Can you perform most household chores?yes  Are you deaf or have significant trouble hearing?No  Do you feel that you have a problem with memory?No  Do you feel safe at home?Yes  Last dentist visit?Unknown  8. Do you have any difficulty performing your  everyday activities?yes, patient is in a wheel chair  Are you having any difficulty walking, taking medications on your own, and or difficulty managing daily home needs?Yes, wheel chair  Do you have difficulty walking or climbing stairs?Yes  Do you have difficulty dressing or bathing?NO  Do you have difficulty doing errands alone such as visiting a doctor's office or shopping?Yes  Do you currently have any difficulty preparing food and eating?NO  Do you currently have any difficulty using the toilet?NO  Do you have any difficulty managing your finances?NO  Do you have any difficulties with housekeeping of managing your housekeeping?Yes    Do you have Advanced Directives in place (Living Will, Healthcare Power or Attorney)? Yes    Last eye Exam and location?patient has an appt today[facility]    Do you currently use prescribed or non-prescribed narcotic or opioid pain medications?yes   Do you have a history or close family history of breast, ovarian, tubal or peritoneal cancer or a family member with BRCA (breast cancer susceptibility 1 and 2) gene mutations?NO    Nurse/Assistant Credentials/time stamp: Leah A.Wright CMA 10:41 am    ----------------------------------------------------------------------------------------------------------------------------------------------------------------------------------------------------------------------  Because this visit was a virtual/telehealth visit, some criteria may be missing or patient reported. Any vitals not documented were not able to be obtained and vitals that have been documented are patient reported.    MEDICARE ANNUAL PREVENTIVE CARE VISIT WITH PROVIDER (Welcome to Medicare, initial annual wellness or annual wellness exam)  Virtual Visit via Phone Note  I connected with [name] Fallow  [name] on 06/08/24  by phone and verified that I am speaking with the correct person using two identifiers. He prefers to talk  by phone.   Location patient: home Location provider:work or home office Persons participating in the virtual visit: patient, provider  Concerns and/or follow up today: stable. Has been working on a healthier diet for about 2 weeks and has lost 4 lbs. Has cut out fast food and is reducing sweets and carbs and eating more protein.    See HM section in Epic for other details of completed HM.    ROS: negative for report of fevers, unintentional weight loss, vision changes, vision loss, hearing loss or change, chest pain, sob, hemoptysis, melena, hematochezia, hematuria, falls, bleeding or bruising, thoughts of suicide or self harm, memory loss  Patient-completed extensive health risk assessment - reviewed and discussed with the patient: See Health Risk Assessment completed with patient prior to the visit either above or in recent phone note. This was reviewed in detailed with the patient today and appropriate recommendations, orders and referrals were placed as needed per Summary below and patient instructions.   Review of Medical History: -PMH, PSH, Family History and current specialty and care providers reviewed and updated and listed below   Patient Care Team: Mercer Clotilda SAUNDERS, MD as PCP - General (Family Medicine) Himmelrich, Camie RAMAN, RD (Inactive) as Dietitian Building surveyor)   Past Medical History:  Diagnosis Date   Anemia    Arthritis    Chronic kidney disease    HX acute kidney failure / acute pyelonephritis / hydronephrosis / severe sepsis per discharge summary 12/27/13   GERD (gastroesophageal reflux disease)    History of kidney stones    Hypertension    Morbid obesity (HCC)    Obstructive sleep apnea    does not need c pap since 110 lb wt loss   Osteoporosis    Prurigo 2002   Scars    ON ARMS FROM CHEMICAL EXPLOSION 1999   Spinal stenosis     Past Surgical History:  Procedure Laterality Date   ANTERIOR CERVICAL DECOMP/DISCECTOMY FUSION N/A 12/07/2018   Procedure:  ANTERIOR CERVICAL DECOMPRESSION FUSION - CERVICAL THREE-CERVICAL FOUR, CERVICAL FOUR-CERVICAL FIVE, CERVICAL FIVE-CERVICAL SIX;  Surgeon: Louis Shove, MD;  Location: [facility];  Service: Neurosurgery;  Laterality: N/A;   BACK SURGERY  01/23/2010   lumbar   BIOPSY  11/26/2022   Procedure: BIOPSY;  Surgeon: San Sandor GAILS, DO;  Location: [facility];  Service: Gastroenterology;;   CHOLECYSTECTOMY N/A 03/24/2016   Procedure: LAPAROSCOPIC CHOLECYSTECTOMY WITH INTRAOPERATIVE CHOLANGIOGRAM;  Surgeon: Deward Null III, MD;  Location: [facility];  Service: General;  Laterality: N/A;   CIRCUMCISION     CYSTOSCOPY WITH RETROGRADE PYELOGRAM, URETEROSCOPY AND STENT PLACEMENT Left 01/20/2014   Procedure: CYSTOSCOPY WITH RETROGRADE PYELOGRAM, URETEROSCOPY AND STENT EXCHANGE;  Surgeon: Toribio Neysa Repine, MD;  Location: [facility];  Service: Urology;  Laterality: Left;  bugbee bladder fulguration   CYSTOSCOPY WITH STENT PLACEMENT Left 12/28/2013   Procedure: CYSTOSCOPY WITH STENT PLACEMENT left retrograde pyleogram;  Surgeon: Toribio Neysa Repine, MD;  [facility];  Service: Urology;  Laterality: Left;   CYSTOSCOPY/URETEROSCOPY/HOLMIUM LASER/STENT PLACEMENT Right 05/15/2021   Procedure: CYSTOSCOPY RIGHT RETROGRADE RIGHT URETEROSCOPY/HOLMIUM LASER/STENT PLACEMENT;  Surgeon: Watt Rush, MD;  [facility];  Service: Urology;  Laterality: Right;   CYSTOSCOPY/URETEROSCOPY/HOLMIUM LASER/STENT PLACEMENT Right 07/08/2023   Procedure: CYSTOSCOPY, RIGHT RETROGRADE PYELOGRAM, RIGHT URETERAL STENT PLACEMENT;  Surgeon: Watt Rush, MD;  [facility];  Service: Urology;  Laterality:  Right;  60 MINS FOR CASE   CYSTOSCOPY/URETEROSCOPY/HOLMIUM LASER/STENT PLACEMENT Right 08/12/2023   Procedure: CYSTOSCOPY RIGHT   URETEROSCOPY/HOLMIUM LASER/STENT EXCHANGE;  Surgeon: Watt Rush, MD;  [facility];  Service: Urology;  Laterality: Right;   ESOPHAGOGASTRODUODENOSCOPY N/A 12/07/2012   Procedure: ESOPHAGOGASTRODUODENOSCOPY  (EGD);  Surgeon: Redell Faith, DO;  [facility];  Service: General;  Laterality: N/A;   ESOPHAGOGASTRODUODENOSCOPY (EGD) WITH PROPOFOL  N/A 11/26/2022   Procedure: ESOPHAGOGASTRODUODENOSCOPY (EGD) WITH PROPOFOL ;  Surgeon: San Sandor GAILS, DO;  [facility];  Service: Gastroenterology;  Laterality: N/A;   EYE SURGERY Bilateral 2024   cataracts removed   FLEXIBLE SIGMOIDOSCOPY N/A 05/01/2023   Procedure: FLEXIBLE SIGMOIDOSCOPY;  Surgeon: Avram Lupita BRAVO, MD;  [facility];  Service: Gastroenterology;  Laterality: N/A;   FLEXIBLE SIGMOIDOSCOPY N/A 08/17/2023   Procedure: FLEXIBLE SIGMOIDOSCOPY;  Surgeon: Rollin Dover, MD;  [facility];  Service: Gastroenterology;  Laterality: N/A;   HOLMIUM LASER APPLICATION Left 01/20/2014   Procedure: HOLMIUM LASER APPLICATION;  Surgeon: Toribio Neysa Repine, MD;  [facility];  Service: Urology;  Laterality: Left;   KNEE ARTHROSCOPY Left 06/29/2018   Procedure: ARTHROSCOPY KNEE;  Surgeon: Lucilla Lynwood BRAVO, MD;  Location: [facility];  Service: Orthopedics;  Laterality: Left;   LAPAROSCOPIC GASTRIC SLEEVE RESECTION N/A 12/07/2012   Procedure: LAPAROSCOPIC GASTRIC SLEEVE RESECTION;  Surgeon: Redell Faith, DO;  [facility];  Service: General;  Laterality: N/A;  laparoscopic sleeve gastrectomy with EGD   LUMBAR LAMINECTOMY N/A 11/17/2017   Procedure: L2-3 LAMINECTOMY AND REDO LAMINECTOMIES  L3-4, L4-5 AND L5-S1;  Surgeon: Lucilla Lynwood BRAVO, MD;  Location: [facility];  Service: Orthopedics;  Laterality: N/A;   LUMBAR WOUND DEBRIDEMENT N/A 06/29/2018   Procedure: LUMBAR WOUND DEBRIDEMENT DRAINAGE AND IRRIGATION; AND ASPIRATION OF LEFT KNEE;  Surgeon: Lucilla Lynwood BRAVO, MD;  Location: [facility];  Service: Orthopedics;  Laterality: N/A;   SAVORY DILATION N/A 11/26/2022   Procedure: SAVORY DILATION;  Surgeon: San Sandor GAILS, DO;  [facility];  Service: Gastroenterology;  Laterality: N/A;   TEE WITHOUT CARDIOVERSION N/A 07/01/2018    Procedure: TRANSESOPHAGEAL ECHOCARDIOGRAM (TEE);  Surgeon: Rolan Ezra RAMAN, MD;  Location[facility];  Service: Cardiovascular;  Laterality: N/A;    Social History   Socioeconomic History   Marital status: Married    Spouse name: Not on file   Number of children: Not on file   Years of education: Not on file   Highest education level: Some college, no degree  Occupational History   Not on file  Tobacco Use   Smoking status: Former    Current packs/day: 0.00    Types: Cigarettes    Quit date: 04/09/1974    Years since quitting: 50.2   Smokeless tobacco: Never  Vaping Use   Vaping status: Never Used  Substance and Sexual Activity   Alcohol use: No   Drug use: No   Sexual activity: Not Currently    Birth control/protection: None  Other Topics Concern   Not on file  Social History Narrative   Not on file   Social Drivers of Health   Financial Resource Strain: Low Risk  (06/07/2024)   Overall Financial Resource Strain (CARDIA)    Difficulty of Paying Living Expenses: Not hard at all  Food Insecurity: No Food Insecurity (06/07/2024)   Hunger Vital Sign    Worried About Running Out of Food in the Last Year: Never true    Ran Out of Food in the Last Year: Never true  Transportation Needs: No Transportation Needs (06/07/2024)   PRAPARE - Administrator, Civil Service (Medical): No    Lack of Transportation (Non-Medical): No  Physical Activity: Insufficiently Active (06/07/2024)   Exercise Vital Sign    Days of Exercise per Week: 2 days    Minutes of Exercise per Session: 10 min  Stress: No Stress Concern Present (06/07/2024)   [employer] of Occupational Health - Occupational Stress Questionnaire    Feeling of Stress: Not at all  Social Connections: Socially Isolated (06/07/2024)   Social Connection and Isolation Panel    Frequency of Communication with Friends and Family: Once a week    Frequency of Social Gatherings with Friends and Family: Once a week     Attends Religious Services: Never    Database administrator or Organizations: No    Attends Engineer, structural: Not on file    Marital Status: Married  Catering manager Violence: Not At Risk (08/17/2023)   Humiliation, Afraid, Rape, and Kick questionnaire    Fear of Current or Ex-Partner: No    Emotionally Abused: No    Physically Abused: No    Sexually Abused: No    Family History  Problem Relation Age of Onset   Diabetes Mother    Heart disease Father    Colon cancer Neg Hx    Colon polyps Neg Hx    Esophageal cancer Neg Hx    Rectal cancer Neg Hx    Stomach cancer Neg Hx     Current Outpatient Medications on File Prior to Visit  Medication Sig Dispense Refill   acetaminophen  (TYLENOL ) 500 MG tablet Take 1,000 mg by mouth as needed for moderate pain (pain score 4-6).     amLODipine  (NORVASC ) 5 MG tablet Take 1 tablet (5 mg total) by mouth in the morning. 90 tablet 3   cetirizine (ZYRTEC) 10 MG tablet Take 10 mg by mouth daily as needed for allergies.     Cholecalciferol (VITAMIN D3 PO) Take 1 tablet by mouth daily.     Cyanocobalamin  (VITAMIN B-12 PO) Take 1 capsule by mouth daily.     FERROUS GLUCONATE  PO Take 1 tablet by mouth daily.     gabapentin  (NEURONTIN ) 100 MG capsule Take 1 capsule (100 mg total) by mouth 2 (two) times daily. 60 capsule 3   polyethylene glycol (MIRALAX  / GLYCOLAX ) 17 g packet Take 17 g by mouth 2 (two) times daily. 14 each 0   Suzetrigine  50 MG TABS Take 50 mg by mouth every 12 (twelve) hours as needed. Take two 50mg  capsules for your first dose at the start of a pain episode on an empty stomach, followed by 50mg  (1 capsule)  with or without food every 12 hours until pain episode is improved 29 tablet 0   traMADol  (ULTRAM ) 50 MG tablet Take 1 tablet (50 mg total) by mouth 2 (two) times daily. 60 tablet 2   No current facility-administered medications on file prior to visit.    Allergies  Allergen Reactions   Unasyn  [Ampicillin-Sulbactam Sodium] Hives, Itching, Swelling and Rash       Physical Exam Vitals requested from patient and listed below if patient had equipment and was able to obtain at home for this virtual visit: There were no vitals filed for this visit. Estimated body mass index is 56.82 kg/m as calculated from the following:   Height as of 04/21/24: 5' 10 (1.778 m).   Weight as of 04/21/24: 396 lb (179.6  kg).  EKG (optional): deferred due to virtual visit  GENERAL: alert, oriented, no acute distress detected; full vision exam deferred due to pandemic and/or virtual encounter  PSYCH/NEURO: pleasant and cooperative, no obvious depression or anxiety, speech and thought processing grossly intact, Cognitive function grossly intact  Flowsheet Row Clinical Support from 06/08/2024 in [facility]  PHQ-9 Total Score 2        06/08/2024   10:29 AM 04/21/2024    4:27 PM 03/04/2024   11:03 AM 03/04/2024   10:50 AM  Depression screen PHQ 2/9  Decreased Interest 0 1 1 0  Down, Depressed, Hopeless 0 0 0 0  PHQ - 2 Score 0 1 1 0  Altered sleeping 0 0 0   Tired, decreased energy 0 1 1   Change in appetite 2 1 0   Feeling bad or failure about yourself  0 1 1   Trouble concentrating 0 0 0   Moving slowly or fidgety/restless 0 0 0   Suicidal thoughts 0 1 0   PHQ-9 Score 2 5 3    Difficult doing work/chores   Not difficult at all        01/08/2024   10:00 AM 03/04/2024   10:50 AM 04/21/2024    4:27 PM 06/07/2024    2:58 PM 06/08/2024   10:29 AM  Fall Risk  Falls in the past year? 0 0  0 0  Was there an injury with Fall? 0  0 0 0  Fall Risk Category Calculator 0   0  0  Patient at Risk for Falls Due to   No Fall Risks  No Fall Risks  Fall risk Follow up     Falls evaluation completed     Patient-reported     SUMMARY AND PLAN:  Medicare annual wellness visit, subsequent  Discussed applicable health maintenance/preventive health measures and advised and  referred or ordered per patient preferences: -discussed colon cancer screening, he has had sigmoidoscopy due to poor clean out with colonoscopy per his wife's report. He plans to discuss plan for furture colon cancer screening with GI. -discussed vaccines due. He knows can get at the pharmacy. Advised to let us  know if he does so that we can update record.  Health Maintenance  Topic Date Due   INFLUENZA VACCINE  06/13/2024 (Originally 05/14/2024)   COVID-19 Vaccine (3 - 2024-25 season) 06/24/2024 (Originally 03/01/2024)   Zoster Vaccines- Shingrix (1 of 2) 09/08/2024 (Originally 11/09/2007)   Colonoscopy  06/08/2025 (Originally 11/08/2002)   Medicare Annual Wellness (AWV)  06/08/2025   COLON CANCER SCREENING 5 YEAR SIGMOIDOSCOPY  08/16/2028   DTaP/Tdap/Td (2 - Td or Tdap) 02/10/2033   Pneumococcal Vaccine: 50+ Years  Completed   Hepatitis C Screening  Completed   HPV VACCINES  Aged Out   Meningococcal B Vaccine  Aged Out     Education and counseling on the following was provided based on the above review of health and a plan/checklist for the patient, along with additional information discussed, was provided for the patient in the patient instructions :   -Provided counseling and safe balance exercises that can be done at home to improve balance and discussed exercise guidelines for adults with include balance exercises at least 3 days per week.  -Advised and counseled on a healthy lifestyle - including the importance of a healthy diet, regular physical activity -Reviewed patient's current diet. Advised and counseled on a whole foods based healthy diet. Discussed replacing ultra-processed grains with  whole grains (with his bread in particular) and reducing ultraprocessed foods. Discussed healthy alternatives for his current bread, plant milk and snacks. Also discussed consuming adequate healthy proteins and increasing veggies and the best veggies for metabolism. A summary of a healthy diet was  provided in the Patient Instructions.  -reviewed patient's current physical activity level and discussed exercise guidelines for adults. Discussed community resources and ideas for safe exercise at home to assist in meeting exercise guideline recommendations in a safe and healthy way.  -Advise yearly dental visits at minimum and regular eye exams   Follow up: see patient instructions   Patient Instructions  I really enjoyed getting to talk with you today! I am available on Tuesdays and Thursdays for virtual visits if you have any questions or concerns, or if I can be of any further assistance.   CHECKLIST FROM ANNUAL WELLNESS VISIT:  -Follow up (please call to schedule if not scheduled after visit):   -yearly for annual wellness visit with primary care office  Here is a list of your preventive care/health maintenance measures and the plan for each if any are due:  PLAN For any measures below that may be due:    1. Check with your Gastroenterologist on plans for colon cancer screening moving forward.   2. Can get vaccines at the pharmacy. Please let us  know if you do so that we can update you record.     Health Maintenance  Topic Date Due   INFLUENZA VACCINE  06/13/2024 (Originally 05/14/2024)   COVID-19 Vaccine (3 - 2024-25 season) 06/24/2024 (Originally 03/01/2024)   Zoster Vaccines- Shingrix (1 of 2) 09/08/2024 (Originally 11/09/2007)   Colonoscopy  06/08/2025 (Originally 11/08/2002)   Medicare Annual Wellness (AWV)  06/08/2025   COLON CANCER SCREENING 5 YEAR SIGMOIDOSCOPY  08/16/2028   DTaP/Tdap/Td (2 - Td or Tdap) 02/10/2033   Pneumococcal Vaccine: 50+ Years  Completed   Hepatitis C Screening  Completed   HPV VACCINES  Aged Out   Meningococcal B Vaccine  Aged Out    -See a dentist at least yearly  -Get your eyes checked and then per your eye specialist's recommendations  -Other issues addressed today:   -I have included below further information regarding a healthy  whole foods based diet, physical activity guidelines for adults, stress management and opportunities for social connections. I hope you find this information useful.   -----------------------------------------------------------------------------------------------------------------------------------------------------------------------------------------------------------------------------------------------------------    NUTRITION: -eat real food: lots of colorful vegetables (half the plate) and fruits -5-7 servings of vegetables and fruits per day (fresh or steamed is best), exp. 2 servings of vegetables with lunch and dinner and 2 servings of fruit per day. Berries and greens such as kale and collards are great choices.  -consume on a regular basis:  fresh fruits, fresh veggies, fish, nuts, seeds, healthy oils (such as olive oil, avocado oil), whole grains (make sure for bread/pasta/crackers/etc., that the first ingredient on label contains the word whole), legumes. -can eat small amounts of dairy and lean meat (no larger than the palm of your hand), but avoid processed meats such as ham, bacon, lunch meat, etc. -drink water  -try to avoid fast food and pre-packaged foods, processed meat, ultra processed foods/beverages (donuts, candy, etc.) -most experts advise limiting sodium to < 2300mg  per day, should limit further is any chronic conditions such as high blood pressure, heart disease, diabetes, etc. The American Heart Association advised that < 1500mg  is is ideal -try to avoid foods/beverages that contain any ingredients with names you  do not recognize  -try to avoid foods/beverages  with added sugar or sweeteners/sweets  -try to avoid sweet drinks (including diet drinks): soda, juice, Gatorade, sweet tea, power drinks, diet drinks -try to avoid white rice, white bread, pasta (unless whole grain)  EXERCISE GUIDELINES FOR ADULTS: -if you wish to increase your physical activity, do so gradually  and with the approval of your doctor -STOP and seek medical care immediately if you have any chest pain, chest discomfort or trouble breathing when starting or increasing exercise  -move and stretch your body, legs, feet and arms when sitting for long periods -Physical activity guidelines for optimal health in adults: -get at least 150 minutes per week of moderate exercise (can talk, but not sing); this is about 20-30 minutes of sustained activity 5-7 days per week or two 10-15 minute episodes of sustained activity 5-7 days per week -do some muscle building/resistance training/strength training at least 2 days per week  -balance exercises 3+ days per week:   Stand somewhere where you have something sturdy to hold onto if you lose balance    1) lift up on toes, then back down, start with 5x per day and work up to 20x   2) stand and lift one leg straight out to the side so that foot is a few inches of the floor, start with 5x each side and work up to 20x each side   3) stand on one foot, start with 5 seconds each side and work up to 20 seconds on each side  If you need ideas or help with getting more active:  -Silver sneakers https://tools.silversneakers.com  -Walk with a Doc: http://www.duncan-williams.com/  -try to include resistance (weight lifting/strength building) and balance exercises twice per week: or the following link for ideas: http://castillo-powell.com/  BuyDucts.dk  STRESS MANAGEMENT: -can try meditating, or just sitting quietly with deep breathing while intentionally relaxing all parts of your body for 5 minutes daily -if you need further help with stress, anxiety or depression please follow up with your primary doctor or contact the wonderful folks at [facility]: [phone]  SOCIAL CONNECTIONS: -options in [place] if you wish to engage in more social and exercise related  activities:  -Silver sneakers https://tools.silversneakers.com  -Walk with a Doc: http://www.duncan-williams.com/  -Check out the [facility] Active Adults 50+ section on the [place] of [employer] (hiking clubs, book clubs, cards and games, chess, exercise classes, aquatic classes and much more) - see the website for details: https://www.[place].gov/departments/parks-recreation/active-adults50  -YouTube has lots of exercise videos for different ages and abilities as well  -[facility] (a variety of indoor and outdoor inperson activities for adults). 7202583030. [address]  -Virtual Online Classes (a variety of topics): see seniorplanet.org or call [phone]  -consider volunteering at a school, hospice center, church, senior center or elsewhere            [name] [name] Cramp, DO

## 2024-06-08 NOTE — Patient Instructions (Signed)
 I really enjoyed getting to talk with you today! I am available on Tuesdays and Thursdays for virtual visits if you have any questions or concerns, or if I can be of any further assistance.   CHECKLIST FROM ANNUAL WELLNESS VISIT:  -Follow up (please call to schedule if not scheduled after visit):   -yearly for annual wellness visit with primary care office  Here is a list of your preventive care/health maintenance measures and the plan for each if any are due:  PLAN For any measures below that may be due:    1. Check with your Gastroenterologist on plans for colon cancer screening moving forward.   2. Can get vaccines at the pharmacy. Please let us  know if you do so that we can update you record.     Health Maintenance  Topic Date Due   INFLUENZA VACCINE  06/13/2024 (Originally 05/14/2024)   COVID-19 Vaccine (3 - 2024-25 season) 06/24/2024 (Originally 03/01/2024)   Zoster Vaccines- Shingrix (1 of 2) 09/08/2024 (Originally 11/09/2007)   Colonoscopy  06/08/2025 (Originally 11/08/2002)   Medicare Annual Wellness (AWV)  06/08/2025   COLON CANCER SCREENING 5 YEAR SIGMOIDOSCOPY  08/16/2028   DTaP/Tdap/Td (2 - Td or Tdap) 02/10/2033   Pneumococcal Vaccine: 50+ Years  Completed   Hepatitis C Screening  Completed   HPV VACCINES  Aged Out   Meningococcal B Vaccine  Aged Out    -See a dentist at least yearly  -Get your eyes checked and then per your eye specialist's recommendations  -Other issues addressed today:   -I have included below further information regarding a healthy whole foods based diet, physical activity guidelines for adults, stress management and opportunities for social connections. I hope you find this information useful.    -----------------------------------------------------------------------------------------------------------------------------------------------------------------------------------------------------------------------------------------------------------    NUTRITION: -eat real food: lots of colorful vegetables (half the plate) and fruits -5-7 servings of vegetables and fruits per day (fresh or steamed is best), exp. 2 servings of vegetables with lunch and dinner and 2 servings of fruit per day. Berries and greens such as kale and collards are great choices.  -consume on a regular basis:  fresh fruits, fresh veggies, fish, nuts, seeds, healthy oils (such as olive oil, avocado oil), whole grains (make sure for bread/pasta/crackers/etc., that the first ingredient on label contains the word whole), legumes. -can eat small amounts of dairy and lean meat (no larger than the palm of your hand), but avoid processed meats such as ham, bacon, lunch meat, etc. -drink water  -try to avoid fast food and pre-packaged foods, processed meat, ultra processed foods/beverages (donuts, candy, etc.) -most experts advise limiting sodium to < 2300mg  per day, should limit further is any chronic conditions such as high blood pressure, heart disease, diabetes, etc. The American Heart Association advised that < 1500mg  is is ideal -try to avoid foods/beverages that contain any ingredients with names you do not recognize  -try to avoid foods/beverages  with added sugar or sweeteners/sweets  -try to avoid sweet drinks (including diet drinks): soda, juice, Gatorade, sweet tea, power drinks, diet drinks -try to avoid white rice, white bread, pasta (unless whole grain)  EXERCISE GUIDELINES FOR ADULTS: -if you wish to increase your physical activity, do so gradually and with the approval of your doctor -STOP and seek medical care immediately if you have any chest pain, chest discomfort or trouble breathing when starting or  increasing exercise  -move and stretch your body, legs, feet and arms when sitting for long periods -Physical activity guidelines for optimal  health in adults: -get at least 150 minutes per week of moderate exercise (can talk, but not sing); this is about 20-30 minutes of sustained activity 5-7 days per week or two 10-15 minute episodes of sustained activity 5-7 days per week -do some muscle building/resistance training/strength training at least 2 days per week  -balance exercises 3+ days per week:   Stand somewhere where you have something sturdy to hold onto if you lose balance    1) lift up on toes, then back down, start with 5x per day and work up to 20x   2) stand and lift one leg straight out to the side so that foot is a few inches of the floor, start with 5x each side and work up to 20x each side   3) stand on one foot, start with 5 seconds each side and work up to 20 seconds on each side  If you need ideas or help with getting more active:  -Silver sneakers https://tools.silversneakers.com  -Walk with a Doc: http://www.duncan-williams.com/  -try to include resistance (weight lifting/strength building) and balance exercises twice per week: or the following link for ideas: http://castillo-powell.com/  BuyDucts.dk  STRESS MANAGEMENT: -can try meditating, or just sitting quietly with deep breathing while intentionally relaxing all parts of your body for 5 minutes daily -if you need further help with stress, anxiety or depression please follow up with your primary doctor or contact the wonderful folks at [facility]: [phone]  SOCIAL CONNECTIONS: -options in [place] if you wish to engage in more social and exercise related activities:  -Silver sneakers https://tools.silversneakers.com  -Walk with a Doc: http://www.duncan-williams.com/  -Check out the [facility] Active Adults 50+  section on the [place] of [employer] (hiking clubs, book clubs, cards and games, chess, exercise classes, aquatic classes and much more) - see the website for details: https://www.[place]-.gov/departments/parks-recreation/active-adults50  -YouTube has lots of exercise videos for different ages and abilities as well  -[facility] (a variety of indoor and outdoor inperson activities for adults). [phone]. [address]  -Virtual Online Classes (a variety of topics): see seniorplanet.org or call (928) 665-4037  -consider volunteering at a school, hospice center, church, senior center or elsewhere

## 2024-06-08 NOTE — Progress Notes (Signed)
 Patient was unable to self-report due to a lack of equipment at home via telehealth

## 2024-06-16 ENCOUNTER — Ambulatory Visit: Admitting: Family Medicine

## 2024-06-16 DIAGNOSIS — I10 Essential (primary) hypertension: Secondary | ICD-10-CM

## 2024-06-16 DIAGNOSIS — G8929 Other chronic pain: Secondary | ICD-10-CM

## 2024-06-16 DIAGNOSIS — M48062 Spinal stenosis, lumbar region with neurogenic claudication: Secondary | ICD-10-CM

## 2024-06-16 DIAGNOSIS — Z978 Presence of other specified devices: Secondary | ICD-10-CM

## 2024-06-17 ENCOUNTER — Telehealth: Payer: Self-pay | Admitting: *Deleted

## 2024-06-17 NOTE — Telephone Encounter (Signed)
 Called [facility] gave VO per Dr. Mercer for cath change once a month

## 2024-06-17 NOTE — Telephone Encounter (Signed)
 Copied from CRM #[id number]. Topic: Clinical - Home Health Verbal Orders >> Jun 17, 2024  2:19 PM Suzen RAMAN wrote: Caller/Agency: [facility] Callback Number: [phone] Service Requested: Skilled Nursing Frequency: once monthly Any new concerns about the patient? No

## 2024-06-23 ENCOUNTER — Encounter: Payer: Self-pay | Admitting: Family Medicine

## 2024-06-23 ENCOUNTER — Ambulatory Visit (INDEPENDENT_AMBULATORY_CARE_PROVIDER_SITE_OTHER): Admitting: Family Medicine

## 2024-06-23 VITALS — BP 142/80 | HR 68 | Temp 98.5°F | Ht 70.0 in | Wt 393.0 lb

## 2024-06-23 DIAGNOSIS — S91001A Unspecified open wound, right ankle, initial encounter: Secondary | ICD-10-CM

## 2024-06-23 DIAGNOSIS — M25561 Pain in right knee: Secondary | ICD-10-CM

## 2024-06-23 DIAGNOSIS — Z978 Presence of other specified devices: Secondary | ICD-10-CM | POA: Diagnosis not present

## 2024-06-23 DIAGNOSIS — I10 Essential (primary) hypertension: Secondary | ICD-10-CM

## 2024-06-23 DIAGNOSIS — S91002A Unspecified open wound, left ankle, initial encounter: Secondary | ICD-10-CM

## 2024-06-23 DIAGNOSIS — M25562 Pain in left knee: Secondary | ICD-10-CM

## 2024-06-23 DIAGNOSIS — M48062 Spinal stenosis, lumbar region with neurogenic claudication: Secondary | ICD-10-CM

## 2024-06-23 DIAGNOSIS — I890 Lymphedema, not elsewhere classified: Secondary | ICD-10-CM

## 2024-06-23 DIAGNOSIS — G8929 Other chronic pain: Secondary | ICD-10-CM

## 2024-06-23 DIAGNOSIS — Z23 Encounter for immunization: Secondary | ICD-10-CM | POA: Diagnosis not present

## 2024-06-23 LAB — BRAIN NATRIURETIC PEPTIDE: Pro B Natriuretic peptide (BNP): 180 pg/mL — ABNORMAL HIGH (ref 0.0–100.0)

## 2024-06-23 LAB — BASIC METABOLIC PANEL WITH GFR
BUN: 14 mg/dL (ref 6–23)
CO2: 28 meq/L (ref 19–32)
Calcium: 8.9 mg/dL (ref 8.4–10.5)
Chloride: 103 meq/L (ref 96–112)
Creatinine, Ser: 0.74 mg/dL (ref 0.40–1.50)
GFR: 94.34 mL/min (ref >60.00)
Glucose, Bld: 89 mg/dL (ref 70–99)
Potassium: 3.6 meq/L (ref 3.5–5.1)
Sodium: 138 meq/L (ref 135–145)

## 2024-06-23 MED ORDER — POTASSIUM CHLORIDE CRYS ER 20 MEQ PO TBCR: 20.0000 meq | EXTENDED_RELEASE_TABLET | Freq: Every day | ORAL | 3 refills | Status: AC

## 2024-06-23 MED ORDER — FUROSEMIDE 20 MG PO TABS: 10.0000 mg | ORAL_TABLET | Freq: Every day | ORAL | 3 refills | Status: AC

## 2024-06-23 NOTE — Patient Instructions (Addendum)
 Prescription for Lasix  10 mg daily (half a tab) were sent to your pharmacy.  A prescription for K-Dur or a potassium supplement was also sent to your pharmacy to be taken with the Lasix .  As Lasix  can increase your urination they can cause your potassium to drop hence taking the potassium supplement to help reduce the risk of your potassium dropping too low.  The potassium tablets can be rather large in size but can be dissolved in a small amount of water .  You may notice residual debris in the cup but you can add a little bit more water  to the cup and drink it to rinse the cup out.  We will try to get more information about lymphedema clinic and placed the referral for you.  The order for home health wound care was placed as well as an order for bariatric bed.

## 2024-06-23 NOTE — Progress Notes (Signed)
 Established Patient Office Visit   Subjective  Patient ID: [name] [name], male    DOB: Aug 20, 1956  Age: 68 y.o. MRN: [id number]  Chief Complaint  Patient presents with   Medical Management of Chronic Issues    6 week follow-up for weight, foley, blood pressure, and pain   Pt accompanied by his wife.  Pt is a 68 yo male seen for f/u on chronic conditions.  Pt requesting order for bariatric electric adjustable bed.  Endorses increased edema in b/l Les.  Now with wounds of ankles possibly from compression socks digging into skin.  Inquires about wound care.  Pt has an appointment with wt management next wk.  H/o gastric sleeve.  Just started increasing water  and protein intake.  Still dealing with chronic back pain from spinal stenosis and chronic knee pain.   Was taking tramadol . Followed by Pain management where he is being treated with non narcotic options.    Patient Active Problem List   Diagnosis Date Noted   Ileus, postoperative (HCC) 08/19/2023   Abnormal x-ray of abdomen 08/18/2023   Chronic indwelling Foley catheter 04/30/2023   Ileus (HCC) 04/29/2023   Abdominal pain, epigastric 11/26/2022   Dyspepsia 11/26/2022   H/O gastric sleeve 11/26/2022   GERD without esophagitis 11/26/2022   Gastroesophageal reflux disease 10/22/2022   Screening for colorectal cancer 10/22/2022   Abdominal distension 10/22/2022   E-coli UTI 12/29/2018   Acute on chronic anemia    Radiculopathy 12/10/2018   Cervical myelopathy (HCC) 12/07/2018   Microcytic hypochromic anemia 08/27/2018   At risk for adverse drug event 08/10/2018   Chronic pain syndrome    Labile blood pressure    Urinary retention    Hypokalemia    Hypertension    Acute blood loss anemia    Anemia of chronic disease    Abscess in epidural space of lumbar spine    Effusion, left knee    Pain and swelling of wrist, left    Complete tear of left rotator cuff 07/03/2018    Class: Chronic   AKI (acute kidney injury)  (HCC)    MSSA bacteremia    Septic arthritis of knee, left (HCC) 06/30/2018    Class: Acute   Leukocytosis    Postoperative seroma involving nervous system after nervous system procedure    Shoulder pain, bilateral    Thoracic myelopathy 06/28/2018   Spondylosis, thoracic, with myelopathy 06/28/2018    Class: Acute   Abscess in epidural space of L2-L5 lumbar spine 06/28/2018    Class: Acute   Acute urinary retention 06/28/2018   Left cervical radiculopathy 06/28/2018    Class: Acute   Spinal stenosis of lumbar region 11/17/2017    Class: Chronic   Status post lumbar laminectomy 11/17/2017   Lumbar stenosis with neurogenic claudication 08/18/2017   Forestier's disease of thoracolumbar region 08/18/2017   Degenerative disc disease, lumbar 08/18/2017   Lumbar radiculopathy 09/30/2016   Morbid (severe) obesity due to excess calories (HCC) 09/30/2016   Biliary calculus with acute cholecystitis 03/21/2016   Acute pyelonephritis 12/28/2013   Ureteral stone with hydronephrosis 12/28/2013   Acute kidney failure (HCC) 12/27/2013   Severe sepsis with acute organ dysfunction (HCC) 12/27/2013   Hypotension 12/27/2013   Lactic acidosis 12/27/2013   Sepsis secondary to UTI (HCC) 12/27/2013   Empyema of pleural space (HCC) 08/30/2008   PRURIGO 08/30/2008   Sleep apnea 08/30/2008   Past Medical History:  Diagnosis Date   Anemia    Arthritis  Chronic kidney disease    HX acute kidney failure / acute pyelonephritis / hydronephrosis / severe sepsis per discharge summary 12/27/13   GERD (gastroesophageal reflux disease)    History of kidney stones    Hypertension    Morbid obesity (HCC)    Neuromuscular disorder (HCC)    Obstructive sleep apnea    does not need c pap since 110 lb wt loss   Osteoporosis    Prurigo 2002   Scars    ON ARMS FROM CHEMICAL EXPLOSION 1999   Sleep apnea    Spinal stenosis    Past Surgical History:  Procedure Laterality Date   ANTERIOR CERVICAL  DECOMP/DISCECTOMY FUSION N/A 12/07/2018   Procedure: ANTERIOR CERVICAL DECOMPRESSION FUSION - CERVICAL THREE-CERVICAL FOUR, CERVICAL FOUR-CERVICAL FIVE, CERVICAL FIVE-CERVICAL SIX;  Surgeon: Louis Shove, MD;  Location: [facility];  Service: Neurosurgery;  Laterality: N/A;   BACK SURGERY  01/23/2010   lumbar   BIOPSY  11/26/2022   Procedure: BIOPSY;  Surgeon: San Sandor GAILS, DO;  Location: [facility];  Service: Gastroenterology;;   CHOLECYSTECTOMY N/A 03/24/2016   Procedure: LAPAROSCOPIC CHOLECYSTECTOMY WITH INTRAOPERATIVE CHOLANGIOGRAM;  Surgeon: Deward Null III, MD;  Location: [facility];  Service: General;  Laterality: N/A;   CIRCUMCISION     CYSTOSCOPY WITH RETROGRADE PYELOGRAM, URETEROSCOPY AND STENT PLACEMENT Left 01/20/2014   Procedure: CYSTOSCOPY WITH RETROGRADE PYELOGRAM, URETEROSCOPY AND STENT EXCHANGE;  Surgeon: Toribio Neysa Repine, MD;  Location: [facility];  Service: Urology;  Laterality: Left;  bugbee bladder fulguration   CYSTOSCOPY WITH STENT PLACEMENT Left 12/28/2013   Procedure: CYSTOSCOPY WITH STENT PLACEMENT left retrograde pyleogram;  Surgeon: Toribio Neysa Repine, MD;  Location: [facility];  Service: Urology;  Laterality: Left;   CYSTOSCOPY/URETEROSCOPY/HOLMIUM LASER/STENT PLACEMENT Right 05/15/2021   Procedure: CYSTOSCOPY RIGHT RETROGRADE RIGHT URETEROSCOPY/HOLMIUM LASER/STENT PLACEMENT;  Surgeon: Watt Rush, MD;  Location: [facility];  Service: Urology;  Laterality: Right;   CYSTOSCOPY/URETEROSCOPY/HOLMIUM LASER/STENT PLACEMENT Right 07/08/2023   Procedure: CYSTOSCOPY, RIGHT RETROGRADE PYELOGRAM, RIGHT URETERAL STENT PLACEMENT;  Surgeon: Watt Rush, MD;  Location: [facility];  Service: Urology;  Laterality: Right;  60 MINS FOR CASE   CYSTOSCOPY/URETEROSCOPY/HOLMIUM LASER/STENT PLACEMENT Right 08/12/2023   Procedure: CYSTOSCOPY RIGHT   URETEROSCOPY/HOLMIUM LASER/STENT EXCHANGE;  Surgeon: Watt Rush, MD;  Location: [facility];  Service: Urology;  Laterality: Right;   ESOPHAGOGASTRODUODENOSCOPY  N/A 12/07/2012   Procedure: ESOPHAGOGASTRODUODENOSCOPY (EGD);  Surgeon: Redell Faith, DO;  [facility]: [facility];  Service: General;  Laterality: N/A;   ESOPHAGOGASTRODUODENOSCOPY (EGD) WITH PROPOFOL  N/A 11/26/2022   Procedure: ESOPHAGOGASTRODUODENOSCOPY (EGD) WITH PROPOFOL ;  Surgeon: San Sandor GAILS, DO;  [facility];  Service: Gastroenterology;  Laterality: N/A;   EYE SURGERY Bilateral 2024   cataracts removed   FLEXIBLE SIGMOIDOSCOPY N/A 05/01/2023   Procedure: FLEXIBLE SIGMOIDOSCOPY;  Surgeon: Avram Lupita BRAVO, MD;  [facility]: [facility];  Service: Gastroenterology;  Laterality: N/A;   FLEXIBLE SIGMOIDOSCOPY N/A 08/17/2023   Procedure: FLEXIBLE SIGMOIDOSCOPY;  Surgeon: Rollin Dover, MD;  [facility];  Service: Gastroenterology;  Laterality: N/A;   HOLMIUM LASER APPLICATION Left 01/20/2014   Procedure: HOLMIUM LASER APPLICATION;  Surgeon: Toribio Neysa Repine, MD;  Location: [facility];  Service: Urology;  Laterality: Left;   KNEE ARTHROSCOPY Left 06/29/2018   Procedure: ARTHROSCOPY KNEE;  Surgeon: Lucilla Lynwood BRAVO, MD;  [facility];  Service: Orthopedics;  Laterality: Left;   LAPAROSCOPIC GASTRIC SLEEVE RESECTION N/A 12/07/2012   Procedure: LAPAROSCOPIC GASTRIC SLEEVE RESECTION;  Surgeon: Redell Faith, DO;  Location: [facility];  Service: General;  Laterality: N/A;  laparoscopic sleeve gastrectomy with EGD   LUMBAR LAMINECTOMY N/A 11/17/2017   Procedure: L2-3 LAMINECTOMY AND REDO LAMINECTOMIES  L3-4, L4-5 AND L5-S1;  Surgeon: Lucilla Lynwood BRAVO, MD;  Location: [facility];  Service: Orthopedics;  Laterality: N/A;   LUMBAR WOUND DEBRIDEMENT N/A 06/29/2018   Procedure: LUMBAR WOUND DEBRIDEMENT DRAINAGE AND IRRIGATION; AND ASPIRATION OF LEFT KNEE;  Surgeon: Lucilla Lynwood BRAVO, MD;  Location: [facility];  Service: Orthopedics;  Laterality: N/A;   SAVORY DILATION N/A 11/26/2022   Procedure: SAVORY DILATION;  Surgeon: San Sandor GAILS, DO;  [facility]: [facility];  Service: Gastroenterology;   Laterality: N/A;   SPINE SURGERY  2000   TEE WITHOUT CARDIOVERSION N/A 07/01/2018   Procedure: TRANSESOPHAGEAL ECHOCARDIOGRAM (TEE);  Surgeon: Rolan Ezra RAMAN, MD;  Location: [facility] ENDOSCOPY;  Service: Cardiovascular;  Laterality: N/A;   Social History   Tobacco Use   Smoking status: Former    Current packs/day: 0.00    Types: Cigarettes    Quit date: 04/09/1974    Years since quitting: 50.2   Smokeless tobacco: Never  Vaping Use   Vaping status: Never Used  Substance Use Topics   Alcohol use: No   Drug use: No   Family History  Problem Relation Age of Onset   Diabetes Mother    Heart disease Father    Colon cancer Neg Hx    Colon polyps Neg Hx    Esophageal cancer Neg Hx    Rectal cancer Neg Hx    Stomach cancer Neg Hx    Allergies  Allergen Reactions   Unasyn [Ampicillin-Sulbactam Sodium] Hives, Itching, Swelling and Rash    ROS Negative unless stated above    Objective:     BP (!) 142/80 (BP Location: Left Arm, Patient Position: Sitting, Cuff Size: Large)   Pulse 68   Temp 98.5 F (36.9 C) (Oral)   Ht 5' 10 (1.778 m)   Wt (!) 393 lb (178.3 kg) Comment: per patient, at home weight  SpO2 99%   BMI 56.39 kg/m  BP Readings from Last 3 Encounters:  06/23/24 (!) 142/80  05/04/24 (!) 144/84  04/21/24 (!) 186/120   Wt Readings from Last 3 Encounters:  06/23/24 (!) 393 lb (178.3 kg)  04/21/24 (!) 396 lb (179.6 kg)  08/17/23 (!) 366 lb (166 kg)      Physical Exam Constitutional:      General: He is not in acute distress.    Appearance: Normal appearance.  HENT:     Head: Normocephalic and atraumatic.     Nose: Nose normal.     Mouth/Throat:     Mouth: Mucous membranes are moist.  Cardiovascular:     Rate and Rhythm: Normal rate and regular rhythm.     Heart sounds: Normal heart sounds. No murmur heard.    No gallop.  Pulmonary:     Effort: Pulmonary effort is normal. No respiratory distress.     Breath sounds: Normal breath sounds. No wheezing,  rhonchi or rales.  Musculoskeletal:     Right lower leg: 3+ Edema present.     Left lower leg: 3+ Edema present.  Skin:    General: Skin is warm and dry.     Comments: Open draining wounds of b/l ankles.  Neurological:     Mental Status: He is alert and oriented to person, place, and time.     Cranial Nerves: Cranial nerves 2-12 are intact.     Comments: Gait not assessed as sitting in motorized wheelchair. Decreased strength  in b/l LEs        06/23/2024   10:18 AM 06/08/2024   10:29 AM 04/21/2024    4:27 PM  Depression screen PHQ 2/9  Decreased Interest 0 0 1  Down, Depressed, Hopeless 0 0 0  PHQ - 2 Score 0 0 1  Altered sleeping 0 0 0  Tired, decreased energy 1 0 1  Change in appetite 0 2 1  Feeling bad or failure about yourself  0 0 1  Trouble concentrating 0 0 0  Moving slowly or fidgety/restless 0 0 0  Suicidal thoughts 0 0 1  PHQ-9 Score 1 2 5   Difficult doing work/chores Not difficult at all        06/23/2024   10:18 AM 06/08/2024   10:31 AM 04/21/2024    4:28 PM  GAD 7 : Generalized Anxiety Score  Nervous, Anxious, on Edge 0 0 0  Control/stop worrying 0 0 0  Worry too much - different things 1 0 0  Trouble relaxing 0 0 0  Restless 0 0 0  Easily annoyed or irritable 0 1 0  Afraid - awful might happen 0 0 0  Total GAD 7 Score 1 1 0  Anxiety Difficulty Not difficult at all       No results found for any visits on 06/23/24.    Assessment & Plan:   Essential hypertension -     Basic metabolic panel with GFR; Future  Morbid obesity (HCC) -     Bed bariatric -     Ambulatory referral to Home Health  Spinal stenosis of lumbar region with neurogenic claudication -     Bed bariatric  Chronic indwelling Foley catheter -     Basic metabolic panel with GFR; Future  Chronic pain of both knees  Need for influenza vaccination -     Flu vaccine HIGH DOSE PF(Fluzone Trivalent)  Lymphedema -     Ambulatory referral to Home Health -     Furosemide ; Take 0.5  tablets (10 mg total) by mouth daily.  Dispense: 90 tablet; Refill: 3 -     Potassium Chloride  Crys ER; Take 1 tablet (20 mEq total) by mouth daily.  Dispense: 90 tablet; Refill: 3 -     Basic metabolic panel with GFR; Future -     Brain natriuretic peptide; Future  Wound of right ankle, initial encounter -     Ambulatory referral to Home Health  Ankle wound, left, initial encounter -     Ambulatory referral to Home Health   BP elevated but improving.  Recheck. Continue norvasc  5 mg dialy.  Lasix  refilled.  Discussed elevating LE to help with edema.  HH order placed for wound care.  Discussed referral to lymphedema clinic to wrap legs.  RX for bariatric bed written.  Obtain labs.  Continue diet changes and and exercise.  Appt with wt clinic next wk.  Flu vaccine given this visit. Continue f/u with pain management for chronic pain from spinal stenosis and b/l knee pain.  Return in about 4 months (around 10/23/2024) for chronic conditions.   Clotilda JONELLE Single, MD

## 2024-06-24 ENCOUNTER — Ambulatory Visit: Payer: Self-pay | Admitting: Family Medicine

## 2024-06-28 NOTE — Progress Notes (Unsigned)
 Office: [phone]  /  Fax: [phone]   Initial Visit    [name] [name] was seen in clinic today to evaluate for obesity. He is interested in losing weight to improve overall health and reduce the risk of weight related complications. He presents today to review program treatment options, initial physical assessment, and evaluation.     He was referred by: {emreferby:28303} When did he have gastric sleeve surgery?  When asked what else they would like to accomplish? He states: {EMHopetoaccomplish:28304::Adopt a healthier eating pattern and lifestyle,Improve energy levels and physical activity,Improve existing medical conditions,Improve quality of life}  When asked how has your weight affected you? He states: {EMWeightAffected:28305}  Weight history: ***  Highest weight: ***  Some associated conditions: {EMSomeConditions:28306}  Contributing factors: {EMcontributingfactors:28307}  Weight promoting medications identified: {EMWeightpromotingrx:28308}  Prior weight loss attempts: {emweightlossprograms:31590::None}  Current nutrition plan: {EMNutritionplan:28309::None}  Current level of physical activity: {EMcurrentPA:28310::None}  Current or previous pharmacotherapy: {EM previousRx:28311}  Response to medication: {EMResponsetomedication:28312}   Past medical history includes:   Past Medical History:  Diagnosis Date   Anemia    Arthritis    Chronic kidney disease    HX acute kidney failure / acute pyelonephritis / hydronephrosis / severe sepsis per discharge summary 12/27/13   GERD (gastroesophageal reflux disease)    History of kidney stones    Hypertension    Morbid obesity (HCC)    Neuromuscular disorder (HCC)    Obstructive sleep apnea    does not need c pap since 110 lb wt loss   Osteoporosis    Prurigo 2002   Scars    ON ARMS FROM CHEMICAL EXPLOSION 1999   Sleep apnea    Spinal stenosis      Objective    There were no vitals  taken for this visit. He was weighed on the bioimpedance scale: There is no height or weight on file to calculate BMI.  Body Fat%:***, Visceral Fat Rating:***, Weight trend over the last 12 months: {emweighttrend:28333}  General:  Alert, oriented and cooperative. Patient is in no acute distress.  Respiratory: Normal respiratory effort, no problems with respiration noted   Gait: able to ambulate independently  Mental Status: Normal mood and affect. Normal behavior. Normal judgment and thought content.   DIAGNOSTIC DATA REVIEWED:  BMET    Component Value Date/Time   NA 138 06/23/2024 1043   NA 144 08/26/2018 0000   K 3.6 06/23/2024 1043   CL 103 06/23/2024 1043   CO2 28 06/23/2024 1043   GLUCOSE 89 06/23/2024 1043   BUN 14 06/23/2024 1043   BUN 8 08/26/2018 0000   CREATININE 0.74 06/23/2024 1043   CREATININE 0.68 (L) 08/25/2018 1143   CALCIUM  8.9 06/23/2024 1043   GFRNONAA >60 08/19/2023 0254   GFRAA >60 08/04/2019 1316   Lab Results  Component Value Date   HGBA1C 5.6 01/08/2024   HGBA1C (H) 03/07/2008    6.4 (NOTE)   The ADA recommends the following therapeutic goals for glycemic   control related to Hgb A1C measurement:   Goal of Therapy:   < 7.0% Hgb A1C   Action Suggested:  > 8.0% Hgb A1C   Ref:  Diabetes Care, 22, Suppl. 1, 1999   No results found for: INSULIN CBC    Component Value Date/Time   WBC 4.5 01/08/2024 1105   RBC 4.85 01/08/2024 1105   HGB 11.9 (L) 01/08/2024 1105   HCT 37.7 (L) 01/08/2024 1105   PLT 247.0 01/08/2024 1105   MCV 77.6 (L)  01/08/2024 1105   MCH 26.0 08/19/2023 0254   MCHC 31.5 01/08/2024 1105   RDW 18.3 (H) 01/08/2024 1105   Iron /TIBC/Ferritin/ %Sat    Component Value Date/Time   IRON  25 (L) 08/19/2023 0254   TIBC 272 08/19/2023 0254   FERRITIN 55 08/19/2023 0254   IRONPCTSAT 9 (L) 08/19/2023 0254   IRONPCTSAT 15 (L) 03/26/2013 0907   Lipid Panel     Component Value Date/Time   CHOL 132 01/08/2024 1105   TRIG 65.0 01/08/2024  1105   HDL 49.10 01/08/2024 1105   CHOLHDL 3 01/08/2024 1105   VLDL 13.0 01/08/2024 1105   LDLCALC 70 01/08/2024 1105   Hepatic Function Panel     Component Value Date/Time   PROT 7.6 01/08/2024 1105   ALBUMIN  3.7 01/08/2024 1105   AST 14 01/08/2024 1105   ALT 8 01/08/2024 1105   ALKPHOS 135 (H) 01/08/2024 1105   BILITOT 0.5 01/08/2024 1105   BILIDIR 0.1 08/17/2023 2039   IBILI 0.5 08/17/2023 2039      Component Value Date/Time   TSH 1.78 01/08/2024 1105     Assessment and Plan   Spinal stenosis of lumbar region with neurogenic claudication  Essential hypertension  Chronic indwelling Foley catheter  Chronic pain of both knees  Lymphedema  Chronic constipation  Alkaline phosphatase elevation  H/O gastric sleeve  Anemia of chronic disease  Chronic pain syndrome  GERD without esophagitis  Forestier's disease of thoracolumbar region    Assessment and Plan Assessment & Plan         Obesity Treatment / Action Plan:  {EMobesityactionplanscribe:28314::Patient will work on garnering support from family and friends to begin weight loss journey.,Will work on eliminating or reducing the presence of highly palatable, calorie dense foods in the home.,Will complete provided nutritional and psychosocial assessment questionnaire before the next appointment.,Will be scheduled for indirect calorimetry to determine resting energy expenditure in a fasting state.  This will allow us  to create a reduced calorie, high-protein meal plan to promote loss of fat mass while preserving muscle mass.,Counseled on the health benefits of losing 5%-15% of total body weight.,Was counseled on nutritional approaches to weight loss and benefits of reducing processed foods and consuming plant-based foods and high quality protein as part of nutritional weight management.,Was counseled on pharmacotherapy and role as an adjunct in weight management. }  Obesity Education Performed  Today:  He was weighed on the bioimpedance scale and results were discussed and documented in the synopsis.  We discussed obesity as a disease and the importance of a more detailed evaluation of all the factors contributing to the disease.  We discussed the importance of long term lifestyle changes which include nutrition, exercise and behavioral modifications as well as the importance of customizing this to his specific health and social needs.  We discussed the benefits of reaching a healthier weight to alleviate the symptoms of existing conditions and reduce the risks of the biomechanical, metabolic and psychological effects of obesity.  We reviewed the four pillars of obesity medicine and importance of using a multimodal approach.  We reviewed the basic principles in weight management.   Jackee Elsie Stain appears to be in the action stage of change and states they are ready to start intensive lifestyle modifications and behavioral modifications.  I have spent *** minutes in the care of the patient today including: {NUMBER 1-10:22536} minutes before the visit reviewing and preparing the chart. *** minutes face-to-face {emfacetoface:32598::assessing and reviewing listed medical problems as outlined in obesity  care plan,providing nutritional and behavioral counseling on topics outlined in the obesity care plan,independently interpreting test results and goals of care, as described in assessment and plan,reviewing and discussing biometric information and progress} {NUMBER 1-10:22536} minutes after the visit updating chart and documentation of encounter.  Reviewed by clinician on day of visit: allergies, medications, problem list, medical history, surgical history, family history, social history, and previous encounter notes pertinent to obesity diagnosis.  Stelios Kirby,PA-C

## 2024-06-29 ENCOUNTER — Ambulatory Visit (INDEPENDENT_AMBULATORY_CARE_PROVIDER_SITE_OTHER): Admitting: Physician Assistant

## 2024-06-29 ENCOUNTER — Encounter (INDEPENDENT_AMBULATORY_CARE_PROVIDER_SITE_OTHER): Payer: Self-pay | Admitting: Physician Assistant

## 2024-06-29 VITALS — BP 112/69 | HR 60 | Temp 97.9°F | Ht 70.0 in | Wt 371.0 lb

## 2024-06-29 DIAGNOSIS — K219 Gastro-esophageal reflux disease without esophagitis: Secondary | ICD-10-CM

## 2024-06-29 DIAGNOSIS — I10 Essential (primary) hypertension: Secondary | ICD-10-CM

## 2024-06-29 DIAGNOSIS — Z0289 Encounter for other administrative examinations: Secondary | ICD-10-CM

## 2024-06-29 DIAGNOSIS — Z6841 Body Mass Index (BMI) 40.0 and over, adult: Secondary | ICD-10-CM

## 2024-06-29 DIAGNOSIS — Z7409 Other reduced mobility: Secondary | ICD-10-CM

## 2024-06-29 DIAGNOSIS — G4733 Obstructive sleep apnea (adult) (pediatric): Secondary | ICD-10-CM

## 2024-06-29 DIAGNOSIS — K5909 Other constipation: Secondary | ICD-10-CM

## 2024-06-29 DIAGNOSIS — I872 Venous insufficiency (chronic) (peripheral): Secondary | ICD-10-CM

## 2024-06-29 DIAGNOSIS — G894 Chronic pain syndrome: Secondary | ICD-10-CM

## 2024-06-29 DIAGNOSIS — M4714 Other spondylosis with myelopathy, thoracic region: Secondary | ICD-10-CM

## 2024-06-29 DIAGNOSIS — Z87891 Personal history of nicotine dependence: Secondary | ICD-10-CM

## 2024-06-29 DIAGNOSIS — I871 Compression of vein: Secondary | ICD-10-CM

## 2024-06-29 DIAGNOSIS — Z978 Presence of other specified devices: Secondary | ICD-10-CM

## 2024-06-29 DIAGNOSIS — M4815 Ankylosing hyperostosis [Forestier], thoracolumbar region: Secondary | ICD-10-CM

## 2024-06-29 DIAGNOSIS — I890 Lymphedema, not elsewhere classified: Secondary | ICD-10-CM

## 2024-06-29 DIAGNOSIS — G8929 Other chronic pain: Secondary | ICD-10-CM

## 2024-06-29 DIAGNOSIS — Z903 Acquired absence of stomach [part of]: Secondary | ICD-10-CM

## 2024-06-29 DIAGNOSIS — M48062 Spinal stenosis, lumbar region with neurogenic claudication: Secondary | ICD-10-CM

## 2024-06-29 DIAGNOSIS — D638 Anemia in other chronic diseases classified elsewhere: Secondary | ICD-10-CM

## 2024-06-29 DIAGNOSIS — M5412 Radiculopathy, cervical region: Secondary | ICD-10-CM

## 2024-06-29 DIAGNOSIS — R748 Abnormal levels of other serum enzymes: Secondary | ICD-10-CM

## 2024-06-29 DIAGNOSIS — E66813 Obesity, class 3: Secondary | ICD-10-CM

## 2024-07-06 ENCOUNTER — Encounter: Attending: Physical Medicine & Rehabilitation | Admitting: Physical Medicine & Rehabilitation

## 2024-07-06 ENCOUNTER — Encounter: Payer: Self-pay | Admitting: Physical Medicine & Rehabilitation

## 2024-07-06 ENCOUNTER — Telehealth: Payer: Self-pay

## 2024-07-06 VITALS — BP 103/65 | HR 63 | Ht 70.0 in

## 2024-07-06 DIAGNOSIS — G894 Chronic pain syndrome: Secondary | ICD-10-CM | POA: Insufficient documentation

## 2024-07-06 DIAGNOSIS — M1712 Unilateral primary osteoarthritis, left knee: Secondary | ICD-10-CM | POA: Diagnosis not present

## 2024-07-06 DIAGNOSIS — M48062 Spinal stenosis, lumbar region with neurogenic claudication: Secondary | ICD-10-CM | POA: Insufficient documentation

## 2024-07-06 MED ORDER — SUZETRIGINE 50 MG PO TABS: 50.0000 mg | ORAL_TABLET | Freq: Two times a day (BID) | ORAL | 0 refills | Status: DC | PRN

## 2024-07-06 MED ORDER — DULOXETINE HCL 20 MG PO CPEP: 20.0000 mg | ORAL_CAPSULE | Freq: Every day | ORAL | 4 refills | Status: DC

## 2024-07-06 NOTE — Progress Notes (Signed)
 Subjective:    Patient ID: [name] [name], male    DOB: August 29, 1956, 68 y.o.   MRN: [id number]  HPI  HPI  [name] [name] is a 68 y.o. year old male  who  has a past medical history of Anemia, Arthritis, Chronic kidney disease, GERD (gastroesophageal reflux disease), History of kidney stones, Hypertension, Morbid obesity (HCC), Neuromuscular disorder (HCC), Obstructive sleep apnea, Osteoporosis, Prurigo (2002), Scars, Sleep apnea, and Spinal stenosis.   They are presenting to PM&R clinic as a new patient for pain management evaluation. They were referred by Dr. Clotilda Single MD for treatment of spinal stenosis pain.  Back pain has been present for over 10 years.  He played football when he was younger and this this contributed. He saw Dr. Lucilla in the past, patient has a history of lumbar multilevel laminectomy for recurrent lumbar spinal stenosis, in 2019. He reports that he was walking with cane or walker at this time. Around this time he began not taking the best care of himself.  He started drinking more alcohol and gained weight.   He stopped exercising around this time. He developed worsening weakness in his legs greater than arms.  He later had neck surgery with ACDF C3-C6 completed by Dr. Malcolm 12/07/2018 and completed rehab again.  Patient had continued worsening of the weakness in his legs and has been nonambulatory for several years.  He now has a chronic indwelling Foley catheter, reports this was placed for prostate issues.  Denies any difficulty with bowel function but chart review indicates he has had issues with ileus/atonic colon in the past.  Patient was also seen by Dr. Vernetta for knee osteoarthritis previously.  Patient reports knees hurt more laying in bed and sitting in his wheelchair.  He has pain in his right lower back going down his legs.  Patient reports tramadol  50 mg twice daily overall is keeping his pain under good control.   Red flag symptoms: No red flags  for back pain endorsed in Hx or ROS, saddle anesthesia, loss of bowel or bladder continence, new weakness, new numbness/tingling, and pain waking up at nighttime.  Medications tried: Topical medications- Hemp cream - did not help much- used today Voltaren  helps a little  Nsaids  Aleve- doesn't help much  Tylenol   doesn't help much  Opiates  Tramadol  helps his pain  Oxycodone  years ago- last used after neck surgery  Gabapentin  / Lyrica  - Denies  TCAs -Denies  SNRIs  - Denies     Other treatments: PT/OT - CIR in 2020, just started PT, session today planned TENs unit - Denies  Injections- Years ago had ESI? - Helped just a little  Surgery - L spine and C spine surgery  Goals for pain control:  Be a little more comfortable overall    Prior UDS results: No results found for: LABOPIA, COCAINSCRNUR, LABBENZ, AMPHETMU, THCU, LABBARB   Interval history 05/04/2024  Patient is here for left knee injection.  Continues to have pain in his left knee, it is worsened after transfers or movements of his left knee.  He does report benefit to his pain with gabapentin .  He was unable to tolerate taking it 3 times a day.  He does tolerate taking it twice a day and it helps with this pain.  Patient continues to have episodes of pain flares of worsening pain in his back and legs.  Interval history 07/06/24 Reports pain is much better overall.- The left knee cortisone injection  from 05/04/2024 is starting to wear off after approximately 2 months.  - Genavix (Journavix): Reports it is working perfectly for occasional use. Currently has 5 pills remaining. A refill will be sent. Discussed medications for intermittent use of pain exacerbations. - Gabapentin : Was prescribed twice a day but was causing excessive sleepiness. Has self-reduced to once daily, which is better tolerated. - Cymbalta  (duloxetine ): Discussed as a new daily medication option for joint/nerve pain. He is agreeable to starting.  Discussed common side effects.  - PMHx: History of kidney stones. No history of liver problems. - Social: He is 68 years old.  - Bowel/Bladder: No issues reported. Bowels are moving well. - Constitutional: No new concerns.  Patient continues on tramadol  ordered by different provider.  Pain Inventory Average Pain 6 Pain Right Now 0 My pain is aching  In the last 24 hours, has pain interfered with the following? General activity 4 Relation with others 0 Enjoyment of life 0 What TIME of day is your pain at its worst? night Sleep (in general) Fair  Pain is worse with: sitting and inactivity Pain improves with: heat/ice, heat, anti-inflammatory medication, Voltaren  Cream Relief from Meds: 10      Family History  Problem Relation Age of Onset   Diabetes Mother    Heart disease Father    Colon cancer Neg Hx    Colon polyps Neg Hx    Esophageal cancer Neg Hx    Rectal cancer Neg Hx    Stomach cancer Neg Hx    Social History   Socioeconomic History   Marital status: Married    Spouse name: Not on file   Number of children: Not on file   Years of education: Not on file   Highest education level: Some college, no degree  Occupational History   Not on file  Tobacco Use   Smoking status: Former    Current packs/day: 0.00    Types: Cigarettes    Quit date: 04/09/1974    Years since quitting: 50.2   Smokeless tobacco: Never  Vaping Use   Vaping status: Never Used  Substance and Sexual Activity   Alcohol use: No   Drug use: No   Sexual activity: Not Currently    Birth control/protection: None  Other Topics Concern   Not on file  Social History Narrative   Not on file   Social Drivers of Health   Financial Resource Strain: Low Risk  (06/07/2024)   Overall Financial Resource Strain (CARDIA)    Difficulty of Paying Living Expenses: Not hard at all  Food Insecurity: No Food Insecurity (06/07/2024)   Hunger Vital Sign    Worried About Running Out of Food in the  Last Year: Never true    Ran Out of Food in the Last Year: Never true  Transportation Needs: No Transportation Needs (06/07/2024)   PRAPARE - Administrator, Civil Service (Medical): No    Lack of Transportation (Non-Medical): No  Physical Activity: Insufficiently Active (06/07/2024)   Exercise Vital Sign    Days of Exercise per Week: 2 days    Minutes of Exercise per Session: 10 min  Stress: No Stress Concern Present (06/07/2024)   [employer] of Occupational Health - Occupational Stress Questionnaire    Feeling of Stress: Not at all  Social Connections: Socially Isolated (06/07/2024)   Social Connection and Isolation Panel    Frequency of Communication with Friends and Family: Once a week    Frequency of Social Gatherings with  Friends and Family: Once a week    Attends Religious Services: Never    Database administrator or Organizations: No    Attends Engineer, structural: Not on file    Marital Status: Married   Past Surgical History:  Procedure Laterality Date   ANTERIOR CERVICAL DECOMP/DISCECTOMY FUSION N/A 12/07/2018   Procedure: ANTERIOR CERVICAL DECOMPRESSION FUSION - CERVICAL THREE-CERVICAL FOUR, CERVICAL FOUR-CERVICAL FIVE, CERVICAL FIVE-CERVICAL SIX;  Surgeon: Louis Shove, MD;  Location: [facility];  Service: Neurosurgery;  Laterality: N/A;   BACK SURGERY  01/23/2010   lumbar   BIOPSY  11/26/2022   Procedure: BIOPSY;  Surgeon: San Sandor GAILS, DO;  Location: [facility];  Service: Gastroenterology;;   CHOLECYSTECTOMY N/A 03/24/2016   Procedure: LAPAROSCOPIC CHOLECYSTECTOMY WITH INTRAOPERATIVE CHOLANGIOGRAM;  Surgeon: Deward Null III, MD;  Location: [facility];  Service: General;  Laterality: N/A;   CIRCUMCISION     CYSTOSCOPY WITH RETROGRADE PYELOGRAM, URETEROSCOPY AND STENT PLACEMENT Left 01/20/2014   Procedure: CYSTOSCOPY WITH RETROGRADE PYELOGRAM, URETEROSCOPY AND STENT EXCHANGE;  Surgeon: Toribio Neysa Repine, MD;  Location: [facility];  Service: Urology;   Laterality: Left;  bugbee bladder fulguration   CYSTOSCOPY WITH STENT PLACEMENT Left 12/28/2013   Procedure: CYSTOSCOPY WITH STENT PLACEMENT left retrograde pyleogram;  Surgeon: Toribio Neysa Repine, MD;  Location: [facility];  Service: Urology;  Laterality: Left;   CYSTOSCOPY/URETEROSCOPY/HOLMIUM LASER/STENT PLACEMENT Right 05/15/2021   Procedure: CYSTOSCOPY RIGHT RETROGRADE RIGHT URETEROSCOPY/HOLMIUM LASER/STENT PLACEMENT;  Surgeon: Watt Rush, MD;  Location: [facility];  Service: Urology;  Laterality: Right;   CYSTOSCOPY/URETEROSCOPY/HOLMIUM LASER/STENT PLACEMENT Right 07/08/2023   Procedure: CYSTOSCOPY, RIGHT RETROGRADE PYELOGRAM, RIGHT URETERAL STENT PLACEMENT;  Surgeon: Watt Rush, MD;  Location: [facility];  Service: Urology;  Laterality: Right;  60 MINS FOR CASE   CYSTOSCOPY/URETEROSCOPY/HOLMIUM LASER/STENT PLACEMENT Right 08/12/2023   Procedure: CYSTOSCOPY RIGHT   URETEROSCOPY/HOLMIUM LASER/STENT EXCHANGE;  Surgeon: Watt Rush, MD;  Location: [facility];  Service: Urology;  Laterality: Right;   ESOPHAGOGASTRODUODENOSCOPY N/A 12/07/2012   Procedure: ESOPHAGOGASTRODUODENOSCOPY (EGD);  Surgeon: Redell Faith, DO;  Location: [facility];  Service: General;  Laterality: N/A;   ESOPHAGOGASTRODUODENOSCOPY (EGD) WITH PROPOFOL  N/A 11/26/2022   Procedure: ESOPHAGOGASTRODUODENOSCOPY (EGD) WITH PROPOFOL ;  Surgeon: San Sandor GAILS, DO;  [facility]: [facility];  Service: Gastroenterology;  Laterality: N/A;   EYE SURGERY Bilateral 2024   cataracts removed   FLEXIBLE SIGMOIDOSCOPY N/A 05/01/2023   Procedure: FLEXIBLE SIGMOIDOSCOPY;  Surgeon: Avram Lupita BRAVO, MD;  [facility]: [facility];  Service: Gastroenterology;  Laterality: N/A;   FLEXIBLE SIGMOIDOSCOPY N/A 08/17/2023   Procedure: FLEXIBLE SIGMOIDOSCOPY;  Surgeon: Rollin Dover, MD;  [facility];  Service: Gastroenterology;  Laterality: N/A;   HOLMIUM LASER APPLICATION Left 01/20/2014   Procedure: HOLMIUM LASER APPLICATION;  Surgeon: Toribio Neysa Repine, MD;  Location: [facility];  Service: Urology;  Laterality: Left;   KNEE ARTHROSCOPY Left 06/29/2018   Procedure: ARTHROSCOPY KNEE;  Surgeon: Lucilla Lynwood BRAVO, MD;  Location[facility] OR;  Service: Orthopedics;  Laterality: Left;   LAPAROSCOPIC GASTRIC SLEEVE RESECTION N/A 12/07/2012   Procedure: LAPAROSCOPIC GASTRIC SLEEVE RESECTION;  Surgeon: Redell Faith, DO;  Location: [facility];  Service: General;  Laterality: N/A;  laparoscopic sleeve gastrectomy with EGD   LUMBAR LAMINECTOMY N/A 11/17/2017   Procedure: L2-3 LAMINECTOMY AND REDO LAMINECTOMIES  L3-4, L4-5 AND L5-S1;  Surgeon: Lucilla Lynwood BRAVO, MD;  Location: [facility];  Service: Orthopedics;  Laterality: N/A;   LUMBAR WOUND DEBRIDEMENT N/A 06/29/2018   Procedure: LUMBAR WOUND DEBRIDEMENT DRAINAGE AND IRRIGATION; AND ASPIRATION OF  LEFT KNEE;  Surgeon: Lucilla Lynwood BRAVO, MD;  Location: [facility] OR;  Service: Orthopedics;  Laterality: N/A;   SAVORY DILATION N/A 11/26/2022   Procedure: SAVORY DILATION;  Surgeon: San Sandor GAILS, DO;  Location: [facility];  Service: Gastroenterology;  Laterality: N/A;   SPINE SURGERY  2000   TEE WITHOUT CARDIOVERSION N/A 07/01/2018   Procedure: TRANSESOPHAGEAL ECHOCARDIOGRAM (TEE);  Surgeon: Rolan Ezra RAMAN, MD;  Location: [facility] END[facility];  Service: Cardiovascular;  Laterality: N/A;   Past Medical History:  Diagnosis Date   Anemia    Arthritis    Chronic kidney disease    HX acute kidney failure / acute pyelonephritis / hydronephrosis / severe sepsis per discharge summary 12/27/13   GERD (gastroesophageal reflux disease)    History of kidney stones    Hypertension    Morbid obesity (HCC)    Neuromuscular disorder (HCC)    Obstructive sleep apnea    does not need c pap since 110 lb wt loss   Osteoporosis    Prurigo 2002   Scars    ON ARMS FROM CHEMICAL EXPLOSION 1999   Sleep apnea    Spinal stenosis    BP 103/65   Pulse 63   Ht 5' 10 (1.778 m)   SpO2 97%   BMI 53.23 kg/m   Opioid Risk Score:   Fall Risk  Score:  `1  Depression screen [facility] 2/9     07/06/2024   11:15 AM 06/23/2024   10:18 AM 06/08/2024   10:29 AM 04/21/2024    4:27 PM 03/04/2024   11:03 AM 03/04/2024   10:50 AM  Depression screen PHQ 2/9  Decreased Interest 0 0 0 1 1 0  Down, Depressed, Hopeless 0 0 0 0 0 0  PHQ - 2 Score 0 0 0 1 1 0  Altered sleeping  0 0 0 0   Tired, decreased energy  1 0 1 1   Change in appetite  0 2 1 0   Feeling bad or failure about yourself   0 0 1 1   Trouble concentrating  0 0 0 0   Moving slowly or fidgety/restless  0 0 0 0   Suicidal thoughts  0 0 1 0   PHQ-9 Score  1 2 5 3    Difficult doing work/chores  Not difficult at all   Not difficult at all      Review of Systems  Musculoskeletal:  Positive for arthralgias, joint swelling and myalgias.       Hip pain, knee pain  Skin:  Positive for rash.       Skin rash/breakdown, easy bleeding  Neurological:  Positive for weakness.       Bladder control problems, bowel control problems, weakness, numbness, trouble walking  All other systems reviewed and are negative.      Objective:   Physical Exam  Gen: no distress, obese HEENT: oral mucosa pink and moist, NCAT Chest: normal effort, normal rate of breathing Abd: soft, non-distended Ext: no edema Psych: pleasant, normal affect Skin: intact Neuro: Alert and awake, follows commands, cranial nerves II through XII grossly intact, normal speech and language Bilateral lower extremity weakness Sensory exam intact light touch in bilateral upper extremities, decreased to absent in bilateral lower extremities  Musculoskeletal:  No significant knee TTP today Mild L-spine tenderness to palpation Sitting in power wheelchair  Prior exam Decreased left shoulder active ROM, able to passively ABduct significantly above his head Left hand unable to extend digits 3 and 4  TTP right lumbar spine paraspinal muscles Using a power wheelchair for mobility Bilateral knee joint line TTP  MRI L spine  04/30/23 EXAM: MRI LUMBAR SPINE WITHOUT AND WITH CONTRAST   TECHNIQUE: Multiplanar and multiecho pulse sequences of the lumbar spine were obtained without and with intravenous contrast.   CONTRAST:  10mL GADAVIST  GADOBUTROL  1 MMOL/ML IV SOLN   COMPARISON:  MRI of the lumbar spine November 03, 2018.   FINDINGS: Segmentation:  Standard.   Alignment: Exaggerated lumbar lordosis with small anterolisthesis of L4 over L5 and L5 over S1.   Vertebrae: No fracture, evidence of discitis, or bone lesion. Congenitally small spinal canal. L2-3, L3-4, L4-5 and L5-S1 laminectomies. Loss of disc height with partial endplate fusion at L4-5 and L5-S1. Prominent posterior element hypertrophy in the lower lumbar spine.   Conus medullaris and cauda equina: Conus extends to the L2 level. Conus appear normal.   Paraspinal and other soft tissues: Diffuse fatty atrophy of the paraspinal muscles.   Disc levels:   T12-L1: Shallow disc bulge and moderate hypertrophic facet degenerative changes resulting in mild spinal canal stenosis and mild bilateral neural foraminal narrowing.   L1-2: Shallow disc bulge and moderate hypertrophic facet degenerative changes resulting in mild spinal canal stenosis and severe bilateral neural foraminal narrowing.   L2-3: Disc bulge and prominent posterior element hypertrophy resulting in severe spinal canal stenosis and severe bilateral neural foraminal narrowing.   L3-4: Disc bulge and prominent posterior elements hypertrophy resulting in severe spinal canal stenosis, severe right and moderate left neural foraminal narrowing.   L4-5: Anterolisthesis, endplate ridging and posterior elements hypertrophy and epidural lipomatosis. Findings result in moderate to severe narrowing of the thecal sac, prominent narrowing of the bilateral subarticular zones, severe right and moderate left neural foraminal narrowing.   L5-S1: Small anterolisthesis, endplate ridging and  posterior element hypertrophy resulting in moderate bilateral neural foraminal narrowing. Mild spinal canal stenosis.   IMPRESSION: 1. Degenerative changes of the lumbar spine superimposed on a congenitally small spinal canal resulting in severe spinal canal stenosis at L2-3 and L3-4 and moderate to severe narrowing of the thecal sac at L4-5. 2. Severe bilateral neural foraminal narrowing at L1-2 and L2-3. 3. Severe right and moderate left neural foraminal narrowing at L3-4 and L4-5. 4. Moderate bilateral neural foraminal narrowing at L5-S1.  L knee xray 2019 No acute fracture.   Moderate to severe osteoarthritic change.  Joint effusion.    Assessment & Plan:  1) Spinal cord injury. He history of severe lumbar stenosis and prior lumbar surgery 2) History of  C spine ACDF 3) Morbid Obesity There is no height or weight on file to calculate BMI. 4) Hx CKD, avoid nephrotoxic medications  - Creatinine 0.65/BUN 20 on 01/08/2024. 5) Neurogenic bladder with Chronic Foley 6) Possible neurogenic bowel No acute fracture. 7) Bilateral knee pain likely due to osteoarthritis, left > right   Moderate to severe osteoarthritic change.  Joint effusion. 7) Chronic lower back pain  Currently he is nonopioid status at this clinic.  UDS showed hydrocodone  and this was inconsistent Opioid risk tool low Discussed TENS unit at prior visit, nexwave was ordered Adjust gabapentin  100 mg daily, he reports sedation with higher doses Continue monitor bowel and bladder function-reports no concerns at this time Will schedule left knee cortisone injection next visit Patient denies history of diabetes or liver dysfunction Continue Journavx  for acute pain episodes.  - Will schedule for repeat knee injection in about 5 weeks with cortisone - Start Cymbalta   20 mg daily for neuropathic/joint pain. Prescription sent to [facility] - Currently engaged in a weight loss program focusing on diet and portion control.  His goal is to lose weight to improve mobility. - Provided a list of recommended foods for weight loss. - Patient would like to consider formal physical therapy until some weight loss is achieved.

## 2024-07-06 NOTE — Telephone Encounter (Signed)
 Copied from CRM [id number]. Topic: Clinical - Order For Equipment >> Jul 06, 2024  2:31 PM Robinson H wrote: Reason for CRM: Patients wife is calling regarding order for a bariatric bed, states that she was told by insurance and supplier they don't have any orders for the patient.  Rojelio [phone]

## 2024-07-08 NOTE — Telephone Encounter (Signed)
 Order has been faxed to [facility] for DME

## 2024-07-09 ENCOUNTER — Telehealth: Payer: Self-pay

## 2024-07-09 NOTE — Telephone Encounter (Signed)
 Copied from CRM [id number]. Topic: Clinical - Home Health Verbal Orders >> Jul 09, 2024  1:48 PM Franky GRADE wrote: Caller/Agency: [facility] Callback Number: [phone] Service Requested: Skilled Nursing Frequency: Request home health Nursing to be moved to Wednesday 07/14/2024 per patient's request.  Any new concerns about the patient? No

## 2024-07-12 NOTE — Telephone Encounter (Signed)
 Called [facility], Vo was given per Dr. Mercer for home

## 2024-07-13 ENCOUNTER — Telehealth: Payer: Self-pay

## 2024-07-13 NOTE — Telephone Encounter (Signed)
 Order has been refaxed.

## 2024-07-13 NOTE — Telephone Encounter (Signed)
 Copied from CRM [id number]. Topic: Clinical - Order For Equipment >> Jul 13, 2024 12:12 PM [name] [name] wrote: Reason for CRM: Patient called in stated [facility] is stating they did not receive an order for the Bariatric bed and will need a new order sent. Did advise patient that an order was faxed on 9/25. Spoke with them today. Please call [phone]

## 2024-07-16 NOTE — Addendum Note (Signed)
 Addended by: MERCER KIRSCH R on: 07/16/2024 06:09 PM   Modules accepted: Level of Service

## 2024-07-18 ENCOUNTER — Encounter: Payer: Self-pay | Admitting: Family Medicine

## 2024-07-18 DIAGNOSIS — Z466 Encounter for fitting and adjustment of urinary device: Secondary | ICD-10-CM

## 2024-07-28 ENCOUNTER — Ambulatory Visit (INDEPENDENT_AMBULATORY_CARE_PROVIDER_SITE_OTHER): Admitting: Family Medicine

## 2024-07-28 ENCOUNTER — Encounter (INDEPENDENT_AMBULATORY_CARE_PROVIDER_SITE_OTHER): Payer: Self-pay | Admitting: Family Medicine

## 2024-07-28 VITALS — BP 138/84 | HR 70 | Temp 97.9°F | Ht 70.0 in | Wt 368.0 lb

## 2024-07-28 DIAGNOSIS — M175 Other unilateral secondary osteoarthritis of knee: Secondary | ICD-10-CM | POA: Diagnosis not present

## 2024-07-28 DIAGNOSIS — R0602 Shortness of breath: Secondary | ICD-10-CM | POA: Diagnosis not present

## 2024-07-28 DIAGNOSIS — Z1331 Encounter for screening for depression: Secondary | ICD-10-CM | POA: Diagnosis not present

## 2024-07-28 DIAGNOSIS — I10 Essential (primary) hypertension: Secondary | ICD-10-CM | POA: Diagnosis not present

## 2024-07-28 DIAGNOSIS — R5383 Other fatigue: Secondary | ICD-10-CM | POA: Diagnosis not present

## 2024-07-28 DIAGNOSIS — M48062 Spinal stenosis, lumbar region with neurogenic claudication: Secondary | ICD-10-CM

## 2024-07-28 DIAGNOSIS — M179 Osteoarthritis of knee, unspecified: Secondary | ICD-10-CM | POA: Insufficient documentation

## 2024-07-28 DIAGNOSIS — G4730 Sleep apnea, unspecified: Secondary | ICD-10-CM | POA: Diagnosis not present

## 2024-07-28 DIAGNOSIS — G4733 Obstructive sleep apnea (adult) (pediatric): Secondary | ICD-10-CM

## 2024-07-28 DIAGNOSIS — Z903 Acquired absence of stomach [part of]: Secondary | ICD-10-CM

## 2024-07-28 DIAGNOSIS — R7303 Prediabetes: Secondary | ICD-10-CM

## 2024-07-28 NOTE — Progress Notes (Unsigned)
 Chief Complaint:  Obesity   Subjective:  [name] [name] (MR# [id number]) is a 68 y.o. male who presents for evaluation and treatment of obesity and related comorbidities.   [name] [name] is currently in the action stage of change and ready to dedicate time achieving and maintaining a healthier weight. [name] [name] is interested in becoming our patient and working on intensive lifestyle modifications including (but not limited to) diet and exercise for weight loss.  [name] [name] has been struggling with his weight. He has been unsuccessful in either losing weight, maintaining weight loss, or reaching his healthy weight goal.  Indirect Calorimeter completed today shows a RMR: 2002. A BMR is not available.   Other Fatigue Keri denies daytime somnolence and denies waking up still tired. Patient has a history of symptoms of {OSA Sx:17850}. [name] [name] states that he has generally restful sleep. Epworth Sleepiness Score is 2.   Shortness of Breath [name] [name] notes increasing shortness of breath with exercising and seems to be worsening over time with weight gain. He notes getting out of breath sooner with activity than he used to. This has not gotten worse recently. [name] [name] denies shortness of breath at rest or orthopnea.  Depression Screen [name] [name]'s Food and Mood (modified PHQ-9) score was 8.     07/28/2024   10:07 AM  Depression screen PHQ 2/9  Decreased Interest 0  Down, Depressed, Hopeless 0  PHQ - 2 Score 0  Altered sleeping 0  Tired, decreased energy 1  Change in appetite 0  Feeling bad or failure about yourself  0  Trouble concentrating 0  Moving slowly or fidgety/restless 0  Suicidal thoughts 0  PHQ-9 Score 1     Objective:  Vitals Temp: 97.9 F (36.6 C) BP: 138/84 Pulse Rate: 70 SpO2: 97 %   Anthropometric Measurements Height: 5' 10 (1.778 m) Weight: (!) 368 lb (166.9 kg) BMI (Calculated): 52.8   No data recorded Other Clinical Data RMR: 2002 Fasting: yes Labs: yes Today's Visit #:  07/28/24    EKG: Normal sinus rhythm, rate 54.  General: Cooperative, alert, well developed, in no acute distress. HEENT: Conjunctivae and lids unremarkable. Cardiovascular: Regular rhythm.  Lungs: Normal work of breathing. Neurologic: No focal deficits.   Lab Results  Component Value Date   CREATININE 0.74 06/23/2024   BUN 14 06/23/2024   NA 138 06/23/2024   K 3.6 06/23/2024   CL 103 06/23/2024   CO2 28 06/23/2024   Lab Results  Component Value Date   ALT 8 01/08/2024   AST 14 01/08/2024   ALKPHOS 135 (H) 01/08/2024   BILITOT 0.5 01/08/2024   Lab Results  Component Value Date   HGBA1C 5.6 01/08/2024   HGBA1C 5.1 07/10/2017   HGBA1C  01/17/2010    5.9 (NOTE) The ADA recommends the following therapeutic goal for glycemic control related to Hgb A1c measurement: Goal of therapy: <6.5 Hgb A1c  Reference: American Diabetes Association: Clinical Practice Recommendations 2010, Diabetes Care, 2010, 33: (Suppl  1).   HGBA1C (H) 03/07/2008    6.4 (NOTE)   The ADA recommends the following therapeutic goals for glycemic   control related to Hgb A1C measurement:   Goal of Therapy:   < 7.0% Hgb A1C   Action Suggested:  > 8.0% Hgb A1C   Ref:  Diabetes Care, 22, Suppl. 1, 1999   No results found for: INSULIN Lab Results  Component Value Date   TSH 1.78 01/08/2024   Lab Results  Component Value Date   CHOL 132 01/08/2024  HDL 49.10 01/08/2024   LDLCALC 70 01/08/2024   TRIG 65.0 01/08/2024   CHOLHDL 3 01/08/2024   Lab Results  Component Value Date   WBC 4.5 01/08/2024   HGB 11.9 (L) 01/08/2024   HCT 37.7 (L) 01/08/2024   MCV 77.6 (L) 01/08/2024   PLT 247.0 01/08/2024   Lab Results  Component Value Date   IRON  25 (L) 08/19/2023   TIBC 272 08/19/2023   FERRITIN 55 08/19/2023    Assessment and Plan:   Other Fatigue  [name] [name] does feel that his weight is causing his energy to be lower than it should be. Fatigue may be related to obesity, depression or many other  causes. Labs will be ordered, and in the meanwhile, Dillian will focus on self care including making healthy food choices, increasing physical activity and focusing on stress reduction.  Shortness of Breath  [name] [name] does feel that he gets out of breath more easily that he used to when he exercises. [name] [name]'s shortness of breath appears to be obesity related and exercise induced. He has agreed to work on weight loss and gradually increase exercise to treat his exercise induced shortness of breath. Will continue to monitor closely.   Problem List Items Addressed This Visit       Cardiovascular and Mediastinum   Hypertension   Patient has been on micardis previously and is now on amlodipine  only.  No chest pain, chest pressure, headache.  BP tends to be well controlled.  Follow up on BP at next appointment CMP today.      Relevant Orders   Comprehensive metabolic panel with GFR     Respiratory   Sleep apnea   Diagnosed in 2004.  Diagnosis of severe obstructive sleep apnea with AHI of 50.  Oxygen desaturation to 62%.  Patient is agreeable to getting a re-evaluation for sleep apnea.  Will refer to [facility].      Relevant Orders   Ambulatory referral to Neurology     Musculoskeletal and Integument   Knee osteoarthritis   Sees Dr. Vernetta and patient was told he needs knee replacement surgery.  Needs BMI of 40 or less.         Other   Spinal stenosis of lumbar region (Chronic)   Previously saw Dr. Lucilla.  Takes Tramadol  as needed- taking it sparingly.      H/O gastric sleeve   Prediabetes   Not on medication- never been on medication.  Highest A1c of 6.4 13 years ago.  Will need repeat A1c and Insulin level today.      Relevant Orders   Hemoglobin A1c   Insulin, random   Other Visit Diagnoses       Other fatigue    -  Primary   Relevant Orders   Vitamin B12   T4, free   T3   Folate   VITAMIN D  25 Hydroxy (Vit-D Deficiency, Fractures)   TSH     SOBOE (shortness  of breath on exertion)       Relevant Orders   CBC with Differential/Platelet   Lipid Panel With LDL/HDL Ratio   EKG 12-Lead     Depression screening           [name] [name] is currently in the action stage of change and his goal is to continue with weight loss efforts. I recommend Kharon begin the structured treatment plan as follows:  He has agreed to {HWW Weight Loss Eojw:789035994}  Exercise goals: {MWM Exercise Recommendations:210964029}  Behavioral modification strategies:{HWW  Behavior Modification:210964008}  He was informed of the importance of frequent follow-up visits to maximize his success with intensive lifestyle modifications for his multiple health conditions. He was informed we would discuss his lab results at his next visit unless there is a critical issue that needs to be addressed sooner. [name] [name] agreed to keep his next visit at the agreed upon time to discuss these results.  Labs ordered with plans to discuss at the next visit.   Attestation Statements:  Reviewed by clinician on day of visit: allergies, medications, problem list, medical history, surgical history, family history, social history, and previous encounter notes.  Time spent on visit including pre-visit chart review and post-visit charting and care was *** minutes.   Adelita Cho, MD

## 2024-07-28 NOTE — Telephone Encounter (Signed)
Ok for new referral?

## 2024-07-28 NOTE — Assessment & Plan Note (Signed)
 Not on medication- never been on medication.  Highest A1c of 6.4 13 years ago.  Will need repeat A1c and Insulin level today.

## 2024-07-28 NOTE — Assessment & Plan Note (Addendum)
 Diagnosed in 2004.  Diagnosis of severe obstructive sleep apnea with AHI of 50.  Oxygen desaturation to 62%.  Patient is agreeable to getting a re-evaluation for sleep apnea.  Will refer to [facility].

## 2024-07-28 NOTE — Progress Notes (Unsigned)
 Patient is retired and lives at home with his wife Palisades.  He is a former Investment banker, operational.  His wife Renie) is supportive of him and is in the program.  Desired weight is 200lbs- last time he was that weight was after his gastric sleeve.  He mentions he started regaining again around 1 year post op due to loss of interest, falling back into old habits and depression.  He likes most food and dislikes only bagels.    Food Recall: orange juice (8oz) and sausage (jimmy dean 1 patty) biscuit (1 biscuit)- feels satisfied.  No snacks between that and lunch.  Eats again at lunch time.  Lunch is a chicken sandwich from [employer]- fried chicken, bun, with pickles and occasionally will eat fries and will drink sweet tea.  Eats all sandwich and drinks all tea and feels full.  May eat pistachios in the afternoon- eats more than a 1 cup over the course of the day without the shells.  No hungry when snacking but wants to eat.  Dinner is 5:30/6pm and will eat caesar salad and spaghetti.  1 cup pasta with crushed tomatoes and parmesan cheese and quarter pound of ground beef.  Felt full.    Category 3 plan

## 2024-07-28 NOTE — Assessment & Plan Note (Signed)
 Patient has been on micardis previously and is now on amlodipine  only.  No chest pain, chest pressure, headache.  BP tends to be well controlled.  Follow up on BP at next appointment CMP today.

## 2024-07-28 NOTE — Assessment & Plan Note (Signed)
 Sees Dr. Vernetta and patient was told he needs knee replacement surgery.  Needs BMI of 40 or less.

## 2024-07-28 NOTE — Assessment & Plan Note (Signed)
 Previously saw Dr. Lucilla.  Takes Tramadol  as needed- taking it sparingly.

## 2024-07-29 LAB — CBC WITH DIFFERENTIAL/PLATELET
Basophils Absolute: 0 x10E3/uL (ref 0.0–0.2)
Basos: 1 %
EOS (ABSOLUTE): 0.3 x10E3/uL (ref 0.0–0.4)
Eos: 6 %
Hematocrit: 37.1 % — ABNORMAL LOW (ref 37.5–51.0)
Hemoglobin: 11.7 g/dL — ABNORMAL LOW (ref 13.0–17.7)
Immature Grans (Abs): 0 x10E3/uL (ref 0.0–0.1)
Immature Granulocytes: 0 %
Lymphocytes Absolute: 1.4 x10E3/uL (ref 0.7–3.1)
Lymphs: 26 %
MCH: 24.7 pg — ABNORMAL LOW (ref 26.6–33.0)
MCHC: 31.5 g/dL (ref 31.5–35.7)
MCV: 78 fL — ABNORMAL LOW (ref 79–97)
Monocytes Absolute: 0.5 x10E3/uL (ref 0.1–0.9)
Monocytes: 10 %
Neutrophils Absolute: 3.1 x10E3/uL (ref 1.4–7.0)
Neutrophils: 57 %
Platelets: 316 x10E3/uL (ref 150–450)
RBC: 4.73 x10E6/uL (ref 4.14–5.80)
RDW: 15.8 % — ABNORMAL HIGH (ref 11.6–15.4)
WBC: 5.3 x10E3/uL (ref 3.4–10.8)

## 2024-07-29 LAB — COMPREHENSIVE METABOLIC PANEL WITH GFR
ALT: 7 IU/L (ref 0–44)
AST: 18 IU/L (ref 0–40)
Albumin: 3.6 g/dL — ABNORMAL LOW (ref 3.9–4.9)
Alkaline Phosphatase: 135 IU/L — ABNORMAL HIGH (ref 47–123)
BUN/Creatinine Ratio: 17 (ref 10–24)
BUN: 13 mg/dL (ref 8–27)
Bilirubin Total: 0.5 mg/dL (ref 0.0–1.2)
CO2: 25 mmol/L (ref 20–29)
Calcium: 8.8 mg/dL (ref 8.6–10.2)
Chloride: 103 mmol/L (ref 96–106)
Creatinine, Ser: 0.75 mg/dL — ABNORMAL LOW (ref 0.76–1.27)
Globulin, Total: 3.8 g/dL (ref 1.5–4.5)
Glucose: 86 mg/dL (ref 70–99)
Potassium: 3.9 mmol/L (ref 3.5–5.2)
Sodium: 141 mmol/L (ref 134–144)
Total Protein: 7.4 g/dL (ref 6.0–8.5)
eGFR: 100 mL/min/1.73 (ref >59)

## 2024-07-29 LAB — LIPID PANEL WITH LDL/HDL RATIO
Cholesterol, Total: 155 mg/dL (ref 100–199)
HDL: 55 mg/dL (ref >39)
LDL Chol Calc (NIH): 86 mg/dL (ref 0–99)
LDL/HDL Ratio: 1.6 ratio (ref 0.0–3.6)
Triglycerides: 69 mg/dL (ref 0–149)
VLDL Cholesterol Cal: 14 mg/dL (ref 5–40)

## 2024-07-29 LAB — VITAMIN B12: Vitamin B-12: 535 pg/mL (ref 232–1245)

## 2024-07-29 LAB — HEMOGLOBIN A1C
Est. average glucose Bld gHb Est-mCnc: 117 mg/dL
Hgb A1c MFr Bld: 5.7 % — ABNORMAL HIGH (ref 4.8–5.6)

## 2024-07-29 LAB — FOLATE: Folate: >20 ng/mL (ref >3.0)

## 2024-07-29 LAB — INSULIN, RANDOM: INSULIN: 6.5 u[IU]/mL (ref 2.6–24.9)

## 2024-07-29 LAB — T3: T3, Total: 102 ng/dL (ref 71–180)

## 2024-07-29 LAB — T4, FREE: Free T4: 1.35 ng/dL (ref 0.82–1.77)

## 2024-07-29 LAB — VITAMIN D 25 HYDROXY (VIT D DEFICIENCY, FRACTURES): Vit D, 25-Hydroxy: 18.3 ng/mL — ABNORMAL LOW (ref 30.0–100.0)

## 2024-07-29 LAB — TSH: TSH: 2.12 u[IU]/mL (ref 0.450–4.500)

## 2024-07-29 NOTE — Assessment & Plan Note (Signed)
 Surgery over 20 years ago.  Patient reports gaining after 1 year.  He still has a good amount of restriction.  Patient to continue taking bariatric vitamin to help vitamin levels up.

## 2024-08-10 ENCOUNTER — Encounter: Payer: Self-pay | Admitting: Physical Medicine & Rehabilitation

## 2024-08-10 ENCOUNTER — Encounter: Attending: Physical Medicine & Rehabilitation | Admitting: Physical Medicine & Rehabilitation

## 2024-08-10 VITALS — BP 135/87 | HR 92 | Ht 70.0 in | Wt 368.0 lb

## 2024-08-10 DIAGNOSIS — M1712 Unilateral primary osteoarthritis, left knee: Secondary | ICD-10-CM | POA: Insufficient documentation

## 2024-08-10 DIAGNOSIS — G894 Chronic pain syndrome: Secondary | ICD-10-CM | POA: Diagnosis not present

## 2024-08-10 MED ORDER — TRIAMCINOLONE ACETONIDE 40 MG/ML IJ SUSP
40.0000 mg | Freq: Once | INTRAMUSCULAR | Status: AC
  Administered 2024-08-10: 40 mg via INTRA_ARTICULAR

## 2024-08-10 MED ORDER — LIDOCAINE HCL 1 % IJ SOLN
4.0000 mL | Freq: Once | INTRAMUSCULAR | Status: AC
  Administered 2024-08-10: 4 mL

## 2024-08-10 MED ORDER — DULOXETINE HCL 40 MG PO CPEP: 40.0000 mg | ORAL_CAPSULE | Freq: Every day | ORAL | 3 refills | Status: AC

## 2024-08-10 NOTE — Progress Notes (Addendum)
 Subjective:    Patient ID: [name] [name], male    DOB: [birth year], 68 y.o.   MRN: [id number]  HPI  HPI  [name] [name] is a 68 y.o. year old male  who  has a past medical history of Anemia, Arthritis, Chronic kidney disease, GERD (gastroesophageal reflux disease), History of kidney stones, Hypertension, Morbid obesity (HCC), Neuromuscular disorder (HCC), Obstructive sleep apnea, Osteoporosis, Prurigo (2002), Scars, Sleep apnea, and Spinal stenosis.   They are presenting to PM&R clinic as a new patient for pain management evaluation. They were referred by Dr. Clotilda Single MD for treatment of spinal stenosis pain.  Back pain has been present for over 10 years.  He played football when he was younger and this this contributed. He saw Dr. Lucilla in the past, patient has a history of lumbar multilevel laminectomy for recurrent lumbar spinal stenosis, in 2019. He reports that he was walking with cane or walker at this time. Around this time he began not taking the best care of himself.  He started drinking more alcohol and gained weight.   He stopped exercising around this time. He developed worsening weakness in his legs greater than arms.  He later had neck surgery with ACDF C3-C6 completed by Dr. Malcolm 12/07/2018 and completed rehab again.  Patient had continued worsening of the weakness in his legs and has been nonambulatory for several years.  He now has a chronic indwelling Foley catheter, reports this was placed for prostate issues.  Denies any difficulty with bowel function but chart review indicates he has had issues with ileus/atonic colon in the past.  Patient was also seen by Dr. Vernetta for knee osteoarthritis previously.  Patient reports knees hurt more laying in bed and sitting in his wheelchair.  He has pain in his right lower back going down his legs.  Patient reports tramadol  50 mg twice daily overall is keeping his pain under good control.   Red flag symptoms: No red flags  for back pain endorsed in Hx or ROS, saddle anesthesia, loss of bowel or bladder continence, new weakness, new numbness/tingling, and pain waking up at nighttime.  Medications tried: Topical medications- Hemp cream - did not help much- used today Voltaren  helps a little  Nsaids  Aleve- doesn't help much  Tylenol   doesn't help much  Opiates  Tramadol  helps his pain  Oxycodone  years ago- last used after neck surgery  Gabapentin  / Lyrica  - Denies  TCAs -Denies  SNRIs  - Denies     Other treatments: PT/OT - CIR in 2020, just started PT, session today planned TENs unit - Denies  Injections- Years ago had ESI? - Helped just a little  Surgery - L spine and C spine surgery  Goals for pain control:  Be a little more comfortable overall    Prior UDS results: No results found for: LABOPIA, COCAINSCRNUR, LABBENZ, AMPHETMU, THCU, LABBARB   Interval history 05/04/2024  Patient is here for left knee injection.  Continues to have pain in his left knee, it is worsened after transfers or movements of his left knee.  He does report benefit to his pain with gabapentin .  He was unable to tolerate taking it 3 times a day.  He does tolerate taking it twice a day and it helps with this pain.  Patient continues to have episodes of pain flares of worsening pain in his back and legs.  Interval history 07/06/24 Reports pain is much better overall.- The left knee cortisone injection  from 05/04/2024 is starting to wear off after approximately 2 months.  - Genavix (Journavix): Reports it is working perfectly for occasional use. Currently has 5 pills remaining. A refill will be sent. Discussed medications for intermittent use of pain exacerbations. - Gabapentin : Was prescribed twice a day but was causing excessive sleepiness. Has self-reduced to once daily, which is better tolerated. - Cymbalta  (duloxetine ): Discussed as a new daily medication option for joint/nerve pain. He is agreeable to starting.  Discussed common side effects.  - PMHx: History of kidney stones. No history of liver problems. - Social: He is 68 years old.  - Bowel/Bladder: No issues reported. Bowels are moving well. - Constitutional: No new concerns.  Patient continues on tramadol  ordered by different provider.  Interval History 08/10/24 Reports left knee pain. The left knee is worse than the right. Previous injection provided relief; for a couple of months.  Medications - Cymbalta : Currently taking a low dose, which is helping with pain. No side effects reported. - Tramadol : Not used recently, but it was effective when used in the past. Ordered by different provider.  - Journavx : Has some available for occasional use and finds it helpful.   Pain Inventory Average Pain 2 Pain Right Now 0 My pain is sharp and aching  In the last 24 hours, has pain interfered with the following? General activity 4 Relation with others 7 Enjoyment of life 0 What TIME of day is your pain at its worst? night Sleep (in general) Fair  Pain is worse with: sitting and inactivity Pain improves with: heat/ice, heat, anti-inflammatory medication, Voltaren  Cream Relief from Meds: 10      Family History  Problem Relation Age of Onset   Hypertension Mother    Diabetes Mother    Hypertension Father    Heart disease Father    Sudden death Father    Colon cancer Neg Hx    Colon polyps Neg Hx    Esophageal cancer Neg Hx    Rectal cancer Neg Hx    Stomach cancer Neg Hx    Social History   Socioeconomic History   Marital status: Married    Spouse name: Not on file   Number of children: Not on file   Years of education: Not on file   Highest education level: Some college, no degree  Occupational History   Occupation: Retired  Tobacco Use   Smoking status: Former    Current packs/day: 0.00    Types: Cigarettes    Quit date: 04/09/1974    Years since quitting: 50.3   Smokeless tobacco: Never  Vaping Use   Vaping  status: Never Used  Substance and Sexual Activity   Alcohol use: No   Drug use: No   Sexual activity: Not Currently    Birth control/protection: None  Other Topics Concern   Not on file  Social History Narrative   Not on file   Social Drivers of Health   Financial Resource Strain: Low Risk  (06/07/2024)   Overall Financial Resource Strain (CARDIA)    Difficulty of Paying Living Expenses: Not hard at all  Food Insecurity: No Food Insecurity (06/07/2024)   Hunger Vital Sign    Worried About Running Out of Food in the Last Year: Never true    Ran Out of Food in the Last Year: Never true  Transportation Needs: No Transportation Needs (06/07/2024)   PRAPARE - Administrator, Civil Service (Medical): No    Lack of Transportation (Non-Medical): No  Physical Activity: Insufficiently Active (06/07/2024)   Exercise Vital Sign    Days of Exercise per Week: 2 days    Minutes of Exercise per Session: 10 min  Stress: No Stress Concern Present (06/07/2024)   [employer] of Occupational Health - Occupational Stress Questionnaire    Feeling of Stress: Not at all  Social Connections: Socially Isolated (06/07/2024)   Social Connection and Isolation Panel    Frequency of Communication with Friends and Family: Once a week    Frequency of Social Gatherings with Friends and Family: Once a week    Attends Religious Services: Never    Database Administrator or Organizations: No    Attends Engineer, Structural: Not on file    Marital Status: Married   Past Surgical History:  Procedure Laterality Date   ANTERIOR CERVICAL DECOMP/DISCECTOMY FUSION N/A 12/07/2018   Procedure: ANTERIOR CERVICAL DECOMPRESSION FUSION - CERVICAL THREE-CERVICAL FOUR, CERVICAL FOUR-CERVICAL FIVE, CERVICAL FIVE-CERVICAL SIX;  Surgeon: Louis Shove, MD;  Location: [facility];  Service: Neurosurgery;  Laterality: N/A;   BACK SURGERY  01/23/2010   lumbar   BIOPSY  11/26/2022   Procedure: BIOPSY;  Surgeon:  San Sandor GAILS, DO;  Location: [facility];  Service: Gastroenterology;;   CHOLECYSTECTOMY N/A 03/24/2016   Procedure: LAPAROSCOPIC CHOLECYSTECTOMY WITH INTRAOPERATIVE CHOLANGIOGRAM;  Surgeon: Deward Null III, MD;  Location: [facility];  Service: General;  Laterality: N/A;   CIRCUMCISION     CYSTOSCOPY WITH RETROGRADE PYELOGRAM, URETEROSCOPY AND STENT PLACEMENT Left 01/20/2014   Procedure: CYSTOSCOPY WITH RETROGRADE PYELOGRAM, URETEROSCOPY AND STENT EXCHANGE;  Surgeon: Toribio Neysa Repine, MD;  Location: [facility];  Service: Urology;  Laterality: Left;  bugbee bladder fulguration   CYSTOSCOPY WITH STENT PLACEMENT Left 12/28/2013   Procedure: CYSTOSCOPY WITH STENT PLACEMENT left retrograde pyleogram;  Surgeon: Toribio Neysa Repine, MD;  Location: [facility];  Service: Urology;  Laterality: Left;   CYSTOSCOPY/URETEROSCOPY/HOLMIUM LASER/STENT PLACEMENT Right 05/15/2021   Procedure: CYSTOSCOPY RIGHT RETROGRADE RIGHT URETEROSCOPY/HOLMIUM LASER/STENT PLACEMENT;  Surgeon: Watt Rush, MD;  Location: [facility];  Service: Urology;  Laterality: Right;   CYSTOSCOPY/URETEROSCOPY/HOLMIUM LASER/STENT PLACEMENT Right 07/08/2023   Procedure: CYSTOSCOPY, RIGHT RETROGRADE PYELOGRAM, RIGHT URETERAL STENT PLACEMENT;  Surgeon: Watt Rush, MD;  Location: [facility];  Service: Urology;  Laterality: Right;  60 MINS FOR CASE   CYSTOSCOPY/URETEROSCOPY/HOLMIUM LASER/STENT PLACEMENT Right 08/12/2023   Procedure: CYSTOSCOPY RIGHT   URETEROSCOPY/HOLMIUM LASER/STENT EXCHANGE;  Surgeon: Watt Rush, MD;  Location: [facility];  Service: Urology;  Laterality: Right;   ESOPHAGOGASTRODUODENOSCOPY N/A 12/07/2012   Procedure: ESOPHAGOGASTRODUODENOSCOPY (EGD);  Surgeon: Redell Faith, DO;  Location: [facility];  Service: General;  Laterality: N/A;   ESOPHAGOGASTRODUODENOSCOPY (EGD) WITH PROPOFOL  N/A 11/26/2022   Procedure: ESOPHAGOGASTRODUODENOSCOPY (EGD) WITH PROPOFOL ;  Surgeon: San Sandor GAILS, DO;  [facility]: [facility];  Service: Gastroenterology;   Laterality: N/A;   EYE SURGERY Bilateral 2024   cataracts removed   FLEXIBLE SIGMOIDOSCOPY N/A 05/01/2023   Procedure: FLEXIBLE SIGMOIDOSCOPY;  Surgeon: Avram Lupita BRAVO, MD;  [facility]: [facility];  Service: Gastroenterology;  Laterality: N/A;   FLEXIBLE SIGMOIDOSCOPY N/A 08/17/2023   Procedure: FLEXIBLE SIGMOIDOSCOPY;  Surgeon: Rollin Dover, MD;  Location: [facility];  Service: Gastroenterology;  Laterality: N/A;   HOLMIUM LASER APPLICATION Left 01/20/2014   Procedure: HOLMIUM LASER APPLICATION;  Surgeon: Toribio Neysa Repine, MD;  Location: [facility];  Service: Urology;  Laterality: Left;   KNEE ARTHROSCOPY Left 06/29/2018   Procedure: ARTHROSCOPY KNEE;  Surgeon: Lucilla Lynwood BRAVO, MD;  Location[facility] OR;  Service: Orthopedics;  Laterality:  Left;   LAPAROSCOPIC GASTRIC SLEEVE RESECTION N/A 12/07/2012   Procedure: LAPAROSCOPIC GASTRIC SLEEVE RESECTION;  Surgeon: Redell Faith, DO;  Location: [facility];  Service: General;  Laterality: N/A;  laparoscopic sleeve gastrectomy with EGD   LUMBAR LAMINECTOMY N/A 11/17/2017   Procedure: L2-3 LAMINECTOMY AND REDO LAMINECTOMIES  L3-4, L4-5 AND L5-S1;  Surgeon: Lucilla Lynwood BRAVO, MD;  Location: [facility];  Service: Orthopedics;  Laterality: N/A;   LUMBAR WOUND DEBRIDEMENT N/A 06/29/2018   Procedure: LUMBAR WOUND DEBRIDEMENT DRAINAGE AND IRRIGATION; AND ASPIRATION OF LEFT KNEE;  Surgeon: Lucilla Lynwood BRAVO, MD;  Location: [facility];  Service: Orthopedics;  Laterality: N/A;   SAVORY DILATION N/A 11/26/2022   Procedure: SAVORY DILATION;  Surgeon: San Sandor GAILS, DO;  Location: [facility];  Service: Gastroenterology;  Laterality: N/A;   SPINE SURGERY  2000   TEE WITHOUT CARDIOVERSION N/A 07/01/2018   Procedure: TRANSESOPHAGEAL ECHOCARDIOGRAM (TEE);  Surgeon: Rolan Ezra RAMAN, MD;  Location[facility];  Service: Cardiovascular;  Laterality: N/A;   Past Medical History:  Diagnosis Date   Anemia    Arthritis    Chronic kidney disease    HX acute kidney failure / acute  pyelonephritis / hydronephrosis / severe sepsis per discharge summary 12/27/13   GERD (gastroesophageal reflux disease)    History of kidney stones    Hypertension    Morbid obesity (HCC)    Neuromuscular disorder (HCC)    Obstructive sleep apnea    does not need c pap since 110 lb wt loss   Osteoporosis    Prurigo 2002   Scars    ON ARMS FROM CHEMICAL EXPLOSION 1999   Sleep apnea    Spinal stenosis    BP 135/87   Pulse 92   Ht 5' 10 (1.778 m)   Wt (!) 368 lb (166.9 kg) Comment: last recorded  SpO2 95%   BMI 52.80 kg/m   Opioid Risk Score:   Fall Risk Score:  `1  Depression screen [facility] 2/9     07/28/2024   10:07 AM 07/06/2024   11:15 AM 06/23/2024   10:18 AM 06/08/2024   10:29 AM 04/21/2024    4:27 PM 03/04/2024   11:03 AM 03/04/2024   10:50 AM  Depression screen PHQ 2/9  Decreased Interest 0 0 0 0 1 1 0  Down, Depressed, Hopeless 0 0 0 0 0 0 0  PHQ - 2 Score 0 0 0 0 1 1 0  Altered sleeping 0  0 0 0 0   Tired, decreased energy 1  1 0 1 1   Change in appetite 0  0 2 1 0   Feeling bad or failure about yourself  0  0 0 1 1   Trouble concentrating 0  0 0 0 0   Moving slowly or fidgety/restless 0  0 0 0 0   Suicidal thoughts 0  0 0 1 0   PHQ-9 Score 1  1 2 5 3    Difficult doing work/chores   Not difficult at all   Not difficult at all      Review of Systems  Musculoskeletal:  Positive for arthralgias, joint swelling and myalgias.       Hip pain, knee pain  Skin:  Positive for rash.       Skin rash/breakdown, easy bleeding  Neurological:  Positive for weakness.       Bladder control problems, bowel control problems, weakness, numbness, trouble walking  All other systems reviewed and are negative.  Objective:   Physical Exam  Gen: no distress, obese HEENT: oral mucosa pink and moist, NCAT Chest: normal effort, normal rate of breathing Abd: soft, non-distended Ext: no edema Psych: pleasant, normal affect Skin: intact Neuro: Alert and awake, follows  commands, cranial nerves II through XII grossly intact, normal speech and language Bilateral lower extremity weakness Sensory exam intact light touch in bilateral upper extremities, decreased to absent in bilateral lower extremities  Musculoskeletal:  L knee TTP and pain with ROM Mild L-spine tenderness to palpation Sitting in power wheelchair  Prior exam Decreased left shoulder active ROM, able to passively ABduct significantly above his head Left hand unable to extend digits 3 and 4 TTP right lumbar spine paraspinal muscles Using a power wheelchair for mobility Bilateral knee joint line TTP  MRI L spine 04/30/23 EXAM: MRI LUMBAR SPINE WITHOUT AND WITH CONTRAST   TECHNIQUE: Multiplanar and multiecho pulse sequences of the lumbar spine were obtained without and with intravenous contrast.   CONTRAST:  10mL GADAVIST  GADOBUTROL  1 MMOL/ML IV SOLN   COMPARISON:  MRI of the lumbar spine November 03, 2018.   FINDINGS: Segmentation:  Standard.   Alignment: Exaggerated lumbar lordosis with small anterolisthesis of L4 over L5 and L5 over S1.   Vertebrae: No fracture, evidence of discitis, or bone lesion. Congenitally small spinal canal. L2-3, L3-4, L4-5 and L5-S1 laminectomies. Loss of disc height with partial endplate fusion at L4-5 and L5-S1. Prominent posterior element hypertrophy in the lower lumbar spine.   Conus medullaris and cauda equina: Conus extends to the L2 level. Conus appear normal.   Paraspinal and other soft tissues: Diffuse fatty atrophy of the paraspinal muscles.   Disc levels:   T12-L1: Shallow disc bulge and moderate hypertrophic facet degenerative changes resulting in mild spinal canal stenosis and mild bilateral neural foraminal narrowing.   L1-2: Shallow disc bulge and moderate hypertrophic facet degenerative changes resulting in mild spinal canal stenosis and severe bilateral neural foraminal narrowing.   L2-3: Disc bulge and prominent posterior  element hypertrophy resulting in severe spinal canal stenosis and severe bilateral neural foraminal narrowing.   L3-4: Disc bulge and prominent posterior elements hypertrophy resulting in severe spinal canal stenosis, severe right and moderate left neural foraminal narrowing.   L4-5: Anterolisthesis, endplate ridging and posterior elements hypertrophy and epidural lipomatosis. Findings result in moderate to severe narrowing of the thecal sac, prominent narrowing of the bilateral subarticular zones, severe right and moderate left neural foraminal narrowing.   L5-S1: Small anterolisthesis, endplate ridging and posterior element hypertrophy resulting in moderate bilateral neural foraminal narrowing. Mild spinal canal stenosis.   IMPRESSION: 1. Degenerative changes of the lumbar spine superimposed on a congenitally small spinal canal resulting in severe spinal canal stenosis at L2-3 and L3-4 and moderate to severe narrowing of the thecal sac at L4-5. 2. Severe bilateral neural foraminal narrowing at L1-2 and L2-3. 3. Severe right and moderate left neural foraminal narrowing at L3-4 and L4-5. 4. Moderate bilateral neural foraminal narrowing at L5-S1.  L knee xray 2019 No acute fracture.   Moderate to severe osteoarthritic change.  Joint effusion.    Assessment & Plan:  1) Spinal cord injury. He history of severe lumbar stenosis and prior lumbar surgery 2) History of  C spine ACDF 3) Morbid Obesity There is no height or weight on file to calculate BMI. 4) Hx CKD, avoid nephrotoxic medications  - Creatinine 0.65/BUN 20 on 01/08/2024. 5) Neurogenic bladder with Chronic Foley 6) Possible neurogenic bowel No acute fracture.  7) Bilateral knee pain likely due to osteoarthritis, left > right   Moderate to severe osteoarthritic change.  Joint effusion. 7) Chronic lower back pain  Currently he is nonopioid status at this clinic.  UDS showed hydrocodone  and this was  inconsistent Opioid risk tool low Discussed TENS unit at prior visit, nexwave was ordered Continue gabapentin  100 mg daily, he reports sedation with higher doses Continue monitor bowel and bladder function-reports no concerns at this time Will schedule left knee cortisone injection next visit Patient denies history of diabetes or liver dysfunction Continue Journavx  for acute pain episodes.  -Increase Cymbalta  to 40 mg daily. Consider increase to 60mg  daily  Advised to call if any adverse effects occur. - Patient would like to consider formal physical therapy until some weight loss is achieved.  Left knee injection     Indication:Knee pain not relieved by medication management and other conservative care.   Informed consent was obtained after describing risks and benefits of the procedure with the patient, this includes bleeding, bruising, infection and medication side effects. The patient wishes to proceed and has given written consent. The patient was placed in a recumbent position. The Lateral aspect of the knee was marked and prepped with Betadine  and alcohol. It was then entered with a 21-gauge 1-1/2 inch needle.  After negative draw back for blood, a solution containing one ML of 40mg  per mL kenalog  and 4 mL of 1% lidocaine  were injected. The patient tolerated the procedure well. Post procedure instructions were given.

## 2024-08-11 ENCOUNTER — Ambulatory Visit (INDEPENDENT_AMBULATORY_CARE_PROVIDER_SITE_OTHER): Admitting: Family Medicine

## 2024-08-11 VITALS — BP 116/75 | HR 70 | Ht 70.0 in | Wt 364.0 lb

## 2024-08-11 DIAGNOSIS — R7303 Prediabetes: Secondary | ICD-10-CM | POA: Diagnosis not present

## 2024-08-11 DIAGNOSIS — E559 Vitamin D deficiency, unspecified: Secondary | ICD-10-CM

## 2024-08-11 DIAGNOSIS — G4733 Obstructive sleep apnea (adult) (pediatric): Secondary | ICD-10-CM | POA: Diagnosis not present

## 2024-08-11 DIAGNOSIS — Z6841 Body Mass Index (BMI) 40.0 and over, adult: Secondary | ICD-10-CM

## 2024-08-11 DIAGNOSIS — Z903 Acquired absence of stomach [part of]: Secondary | ICD-10-CM

## 2024-08-11 MED ORDER — VITAMIN D (ERGOCALCIFEROL) 1.25 MG (50000 UNIT) PO CAPS: 50000.0000 [IU] | ORAL_CAPSULE | ORAL | 0 refills | Status: DC

## 2024-08-11 NOTE — Progress Notes (Signed)
 SUBJECTIVE:  Chief Complaint: Obesity  Interim History: first follow up today.  He has been keeping his calorie count between 1200-1400 calories and he mostly only eats protein.  No hunger.  Did have cravings initially but since then he hasn't had any.  Typical morning is sausage (pork), egg (2), toast (1 slice of 45 calorie bread).  Lunch is baked chicken breast, spinach and ocean spray.  Dinner is baked fish, cabbage, cornbread muffin and iced tea ([place]  diet).  [name] [name] is here to discuss his progress with his obesity treatment plan. He is on the Category 3 Plan and states he is following his eating plan approximately 90 % of the time. He states he is exercising 60 minutes 3 times per week.   OBJECTIVE: Visit Diagnoses: Problem List Items Addressed This Visit   None   Vitals BP: 135/87 Pulse Rate: 92 SpO2: 95 %   Anthropometric Measurements Height: 5' 10 (1.778 m) Weight: (!) 368 lb (166.9 kg) (last recorded) BMI (Calculated): 52.8   No data recorded Other Clinical Data RMR: [id number] Fasting: No Labs: No Today's Visit #: 2 Starting Date: 07/28/24     ASSESSMENT AND PLAN: Assessment & Plan H/O gastric sleeve Continue to encourage patient on working on increasing his protein intake while limiting simple carbohydrates and sugary drinks.  Patient does have a history of early satiety given his bariatric surgery.  Will follow-up on quantity of food he can consume at next appointment. Prediabetes Pathophysiology of progression through insulin resistance to prediabetes and diabetes was discussed at length today.  Patient to continue to monitor and be in control of total intake of snack calories which may be simple carbohydrates but should be consumed only after the patient has taken in all the nutrition for the day.  Macronutrient identification, classification and daily intake ratios were discussed.  Plan to repeat labs in 3 months to monitor both hemoglobin A1c and insulin  levels.  No medications at this time as patient is not having significant hunger or cravings that would make following meal plan more difficult.    Obstructive sleep apnea syndrome Patient received initial message from [facility] concerning setting up appointment for OSA.  He will message them back to discuss time and days that work best for him. Vitamin D  deficiency Discussed importance of vitamin d  supplementation.  Vitamin d  supplementation has been shown to decrease fatigue, decrease risk of progression to insulin resistance and then prediabetes, decreases risk of falling in older age and can even assist in decreasing depressive symptoms in PTSD.   Prescription for Vitamin D  sent in.   Morbid obesity (HCC)  BMI 50.0-59.9, adult (HCC)    Diet: Kolson is currently in the action stage of change. As such, his goal is to continue with weight loss efforts and has agreed to the Category 3 Plan.   Exercise:  Older adults should determine their level of effort for physical activity relative to their level of fitness.  Behavior Modification:  We discussed the following Behavioral Modification Strategies today: increasing lean protein intake, decreasing simple carbohydrates, meal planning and cooking strategies, keeping healthy foods in the home, and avoiding temptations.   Follow-up in 2 to 3 weeks  He was informed of the importance of frequent follow up visits to maximize his success with intensive lifestyle modifications for his multiple health conditions.  Attestation Statements:   Reviewed by clinician on day of visit: allergies, medications, problem list, medical history, surgical history, family history, social history, and previous encounter  notes.     Adelita Cho, MD

## 2024-08-11 NOTE — Assessment & Plan Note (Signed)
 Patient received initial message from [facility] concerning setting up appointment for OSA.  He will message them back to discuss time and days that work best for him.

## 2024-08-11 NOTE — Assessment & Plan Note (Signed)
 Pathophysiology of progression through insulin  resistance to prediabetes and diabetes was discussed at length today.  Patient to continue to monitor and be in control of total intake of snack calories which may be simple carbohydrates but should be consumed only after the patient has taken in all the nutrition for the day.  Macronutrient identification, classification and daily intake ratios were discussed.  Plan to repeat labs in 3 months to monitor both hemoglobin A1c and insulin  levels.  No medications at this time as patient is not having significant hunger or cravings that would make following meal plan more difficult.

## 2024-08-17 NOTE — Assessment & Plan Note (Signed)
 Continue to encourage patient on working on increasing his protein intake while limiting simple carbohydrates and sugary drinks.  Patient does have a history of early satiety given his bariatric surgery.  Will follow-up on quantity of food he can consume at next appointment.

## 2024-08-19 ENCOUNTER — Telehealth: Payer: Self-pay

## 2024-08-19 NOTE — Telephone Encounter (Signed)
 Copied from CRM [id number]. Topic: Clinical - Medical Advice >> Aug 19, 2024 11:33 AM Mia F wrote: Reason for CRM: Crystal RN Case Manage from [facility] called with an updated plan of care. Pt was recertified for home health services to change cathater every 4 weeks and as needed.  Crystal [phone]

## 2024-08-19 NOTE — Telephone Encounter (Signed)
 Spoke with Crystal RN Case Manage from [facility] VO given for catheter change, per Dr. Mercer

## 2024-08-30 ENCOUNTER — Encounter: Payer: Self-pay | Admitting: Neurology

## 2024-08-30 ENCOUNTER — Ambulatory Visit: Admitting: Neurology

## 2024-08-30 VITALS — BP 132/86 | HR 79 | Ht 70.0 in | Wt 364.0 lb

## 2024-08-30 DIAGNOSIS — G959 Disease of spinal cord, unspecified: Secondary | ICD-10-CM | POA: Diagnosis not present

## 2024-08-30 DIAGNOSIS — Z9189 Other specified personal risk factors, not elsewhere classified: Secondary | ICD-10-CM | POA: Diagnosis not present

## 2024-08-30 DIAGNOSIS — Z8669 Personal history of other diseases of the nervous system and sense organs: Secondary | ICD-10-CM | POA: Insufficient documentation

## 2024-08-30 DIAGNOSIS — E662 Morbid (severe) obesity with alveolar hypoventilation: Secondary | ICD-10-CM | POA: Insufficient documentation

## 2024-08-30 DIAGNOSIS — E669 Obesity, unspecified: Secondary | ICD-10-CM | POA: Insufficient documentation

## 2024-08-30 NOTE — Progress Notes (Signed)
 @[facility]   SLEEP MEDICINE   Provider:  Dedra Gores, MD   Primary Care Physician:  Mercer Clotilda SAUNDERS, MD [address] [place] [address]   Referring Provider: Berkeley Adelita PENNER, Md [address] [place],  [place] [address]        Chief Concern for this Consultation:   Patient presents with          HPI: I have the pleasure of meeting with [name] [name] , on 08/30/24 , who is a 68 y.o.  male patient,  seen upon a referral by his weight and wellness physician, Dr Carnell , MD , for a  Sleep Medicine Consultation.  The patient's referral information asked for a new evaluation of sleep apnea after a hiatus of 20 years, his last study was directed by [employer] [facility] on [employer].  He was told he had severe sleep apnea, used CPAP for several years but then  underwent gastric sleeve surgery , and last  200 pounds -has not used CPAP again in the last 15 years.     Chief concern according to patient:  @PTNAME  presented with a medical history of  Past Medical History:  Diagnosis Date   Anemia    Arthritis    Chronic kidney disease    HX acute kidney failure / acute pyelonephritis / hydronephrosis / severe sepsis per discharge summary 12/27/13   GERD (gastroesophageal reflux disease)    History of kidney stones    Hypertension    Morbid obesity (HCC)        Obstructive sleep apnea    does not need c pap since 110 lb wt loss   Osteoporosis    Prurigo 2002   Scars    ON ARMS FROM CHEMICAL EXPLOSION 1999       Spinal stenosis , lumbar with gait disorder Spinal cord injury. He history of severe lumbar stenosis and prior lumbar surgery 2) History of  C spine ACDF 3) Morbid ( SUPER) Obesity There is no height or weight on file to calculate BMI. 4) Hx CKD, avoid nephrotoxic medications             - Creatinine 0.65/BUN 20 on 01/08/2024. 5) Neurogenic bladder with Chronic Foley cath  6) Neurogenic bowel  7) Bilateral knee pain likely due to osteoarthritis,  left > right Moderate to severe osteoarthritic change.  Joint effusion. 8) Chronic lower back pain, on gabapentin        No history of ENT surgery or problems: no Trauma such as TBI/ whiplash,  iron  deficiency Anemia, RLS,  GERD,.  Family medical history: There are no  biological family members affected by Sleep apnea . Mother had DM, father had AD,     Social history:  is retired from investment banker, operational, cooked in restaurants until 2010, disability related  end to his career. Wheelchair bound since 2019.  He  lives in a private home, in a household with spouse  and has no pets.  The patient currently  used to work in late shifts (night/ rotating,).  Nicotine use: /.  ETOH use: /,  Caffeine intake in form of: Coffee (1 am), Soft drinks (occasion ), Tea ( /) , no  Energy drinks ( including those containing  taurine ). Caffeine is last consumed in AM .  No Exercise.   Hobby: cooking.    Sleep habits and routines are as follows: The patient's dinner time is around 5-6 PM.  Evening time is spent by reading. TV .  The patient goes to bed at, or close to 8  PM. The bedroom is not shared , but wife hears his snoring.  The bedroom is described as cool, quiet, and dark. The patient reports that it takes < 10 minutes to fall asleep, then continues to sleep for 6-7 hours, uninterrupted by the need to void (Nocturia).- he has a foley catheter    The preferred sleep position is reclined, with support of 2 pillows, (non- adjustable bed/ recliner ). The total estimated sleep time is circa 7 hours.  Dreams are reportedly  frequent/ and can be vivid=. Dream enactment has not been reported.   7.30 AM is the usual week- day rise time. The patient wakes up spontaneously/ 7 AM  He  reports mostly/ feeling refreshed and restored in the morning, waking with symptoms such as dry mouth, stiffness or pain in knees, and fatigue.  No sleep paralysis has been experienced.  Naps in daytime are taken infrequently (there is a desire to  nap and opportunity), lasting from 45 to 60 and have a refreshing quality. These do not interfere with nocturnal sleep.      Review of Systems: Out of a complete 14 system review, the patient complains of only the following symptoms, and all other reviewed systems are negative.:  Hypersomnia   Snoring, no Sleep fragmentation,  no Nocturia   How likely are you to doze in the following situations: 0 = not likely, 1 = slight chance, 2 = moderate chance, 3 = high chance Sitting and Reading? Watching Television? Sitting inactive in a public place (theater or meeting)? As a passenger in a car for an hour without a break? Lying down in the afternoon when circumstances permit? Sitting and talking to someone? Sitting quietly after lunch without alcohol? In a car, while stopped for a few minutes in traffic?   Total ESS =2 / 24 points.    FSS endorsed at 19/ 63 points.  GDS:  2/ 15 Social History   Socioeconomic History   Marital status: Married    Spouse name: Not on file   Number of children: Not on file   Years of education: Not on file   Highest education level: Some college, no degree  Occupational History   Occupation: Retired  Tobacco Use   Smoking status: Former    Current packs/day: 0.00    Types: Cigarettes    Quit date: 04/09/1974    Years since quitting: 50.4   Smokeless tobacco: Never  Vaping Use   Vaping status: Never Used  Substance and Sexual Activity   Alcohol use: No   Drug use: No   Sexual activity: Not Currently    Birth control/protection: None  Other Topics Concern   Not on file  Social History Narrative   1 cup of coffee every morning    Social Drivers of Health   Financial Resource Strain: Low Risk  (06/07/2024)   Overall Financial Resource Strain (CARDIA)    Difficulty of Paying Living Expenses: Not hard at all  Food Insecurity: No Food Insecurity (06/07/2024)   Hunger Vital Sign    Worried About Running Out of Food in the Last Year: Never true     Ran Out of Food in the Last Year: Never true  Transportation Needs: No Transportation Needs (06/07/2024)   PRAPARE - Administrator, Civil Service (Medical): No    Lack of Transportation (Non-Medical): No  Physical Activity: Insufficiently Active (06/07/2024)   Exercise Vital Sign  Days of Exercise per Week: 2 days    Minutes of Exercise per Session: 10 min  Stress: No Stress Concern Present (06/07/2024)   [employer] of Occupational Health - Occupational Stress Questionnaire    Feeling of Stress: Not at all  Social Connections: Socially Isolated (06/07/2024)   Social Connection and Isolation Panel    Frequency of Communication with Friends and Family: Once a week    Frequency of Social Gatherings with Friends and Family: Once a week    Attends Religious Services: Never    Database Administrator or Organizations: No    Attends Engineer, Structural: Not on file    Marital Status: Married    Family History  Problem Relation Age of Onset   Hypertension Mother    Diabetes Mother    Hypertension Father    Heart disease Father    Sudden death Father    Colon cancer Neg Hx    Colon polyps Neg Hx    Esophageal cancer Neg Hx    Rectal cancer Neg Hx    Stomach cancer Neg Hx     Past Medical History:  Diagnosis Date   Anemia    Arthritis    Chronic kidney disease    HX acute kidney failure / acute pyelonephritis / hydronephrosis / severe sepsis per discharge summary 12/27/13   GERD (gastroesophageal reflux disease)    History of kidney stones    Hypertension    Morbid obesity (HCC)    Neuromuscular disorder (HCC)    Obstructive sleep apnea    does not need c pap since 110 lb wt loss   Osteoporosis    Prurigo 2002   Scars    ON ARMS FROM CHEMICAL EXPLOSION 1999   Sleep apnea    Spinal stenosis     Past Surgical History:  Procedure Laterality Date   ANTERIOR CERVICAL DECOMP/DISCECTOMY FUSION N/A 12/07/2018   Procedure: ANTERIOR CERVICAL  DECOMPRESSION FUSION - CERVICAL THREE-CERVICAL FOUR, CERVICAL FOUR-CERVICAL FIVE, CERVICAL FIVE-CERVICAL SIX;  Surgeon: Louis Shove, MD;  Location: [facility];  Service: Neurosurgery;  Laterality: N/A;   BACK SURGERY  01/23/2010   lumbar   BIOPSY  11/26/2022   Procedure: BIOPSY;  Surgeon: San Sandor GAILS, DO;  Location: [facility];  Service: Gastroenterology;;   CHOLECYSTECTOMY N/A 03/24/2016   Procedure: LAPAROSCOPIC CHOLECYSTECTOMY WITH INTRAOPERATIVE CHOLANGIOGRAM;  Surgeon: Deward Null III, MD;  Location: [facility];  Service: General;  Laterality: N/A;   CIRCUMCISION     CYSTOSCOPY WITH RETROGRADE PYELOGRAM, URETEROSCOPY AND STENT PLACEMENT Left 01/20/2014   Procedure: CYSTOSCOPY WITH RETROGRADE PYELOGRAM, URETEROSCOPY AND STENT EXCHANGE;  Surgeon: Toribio Neysa Repine, MD;  Location: [facility];  Service: Urology;  Laterality: Left;  bugbee bladder fulguration   CYSTOSCOPY WITH STENT PLACEMENT Left 12/28/2013   Procedure: CYSTOSCOPY WITH STENT PLACEMENT left retrograde pyleogram;  Surgeon: Toribio Neysa Repine, MD;  Location: [facility];  Service: Urology;  Laterality: Left;   CYSTOSCOPY/URETEROSCOPY/HOLMIUM LASER/STENT PLACEMENT Right 05/15/2021   Procedure: CYSTOSCOPY RIGHT RETROGRADE RIGHT URETEROSCOPY/HOLMIUM LASER/STENT PLACEMENT;  Surgeon: Watt Rush, MD;  Location: [facility];  Service: Urology;  Laterality: Right;   CYSTOSCOPY/URETEROSCOPY/HOLMIUM LASER/STENT PLACEMENT Right 07/08/2023   Procedure: CYSTOSCOPY, RIGHT RETROGRADE PYELOGRAM, RIGHT URETERAL STENT PLACEMENT;  Surgeon: Watt Rush, MD;  Location: [facility];  Service: Urology;  Laterality: Right;  60 MINS FOR CASE   CYSTOSCOPY/URETEROSCOPY/HOLMIUM LASER/STENT PLACEMENT Right 08/12/2023   Procedure: CYSTOSCOPY RIGHT   URETEROSCOPY/HOLMIUM LASER/STENT EXCHANGE;  Surgeon: Watt Rush, MD;  Location: [facility];  Service: Urology;  Laterality: Right;   ESOPHAGOGASTRODUODENOSCOPY N/A 12/07/2012   Procedure: ESOPHAGOGASTRODUODENOSCOPY (EGD);  Surgeon:  Redell Faith, DO;  [facility];  Service: General;  Laterality: N/A;   ESOPHAGOGASTRODUODENOSCOPY (EGD) WITH PROPOFOL  N/A 11/26/2022   Procedure: ESOPHAGOGASTRODUODENOSCOPY (EGD) WITH PROPOFOL ;  Surgeon: San Sandor GAILS, DO;  [facility];  Service: Gastroenterology;  Laterality: N/A;   EYE SURGERY Bilateral 2024   cataracts removed   FLEXIBLE SIGMOIDOSCOPY N/A 05/01/2023   Procedure: FLEXIBLE SIGMOIDOSCOPY;  Surgeon: Avram Lupita BRAVO, MD;  [facility];  Service: Gastroenterology;  Laterality: N/A;   FLEXIBLE SIGMOIDOSCOPY N/A 08/17/2023   Procedure: FLEXIBLE SIGMOIDOSCOPY;  Surgeon: Rollin Dover, MD;  [facility];  Service: Gastroenterology;  Laterality: N/A;   HOLMIUM LASER APPLICATION Left 01/20/2014   Procedure: HOLMIUM LASER APPLICATION;  Surgeon: Toribio Neysa Repine, MD;  Location: [facility];  Service: Urology;  Laterality: Left;   KNEE ARTHROSCOPY Left 06/29/2018   Procedure: ARTHROSCOPY KNEE;  Surgeon: Lucilla Lynwood BRAVO, MD;  Location: [facility] OR;  Service: Orthopedics;  Laterality: Left;   LAPAROSCOPIC GASTRIC SLEEVE RESECTION N/A 12/07/2012   Procedure: LAPAROSCOPIC GASTRIC SLEEVE RESECTION;  Surgeon: Redell Faith, DO;  [facility]: [facility];  Service: General;  Laterality: N/A;  laparoscopic sleeve gastrectomy with EGD   LUMBAR LAMINECTOMY N/A 11/17/2017   Procedure: L2-3 LAMINECTOMY AND REDO LAMINECTOMIES  L3-4, L4-5 AND L5-S1;  Surgeon: Lucilla Lynwood BRAVO, MD;  Location: [facility];  Service: Orthopedics;  Laterality: N/A;   LUMBAR WOUND DEBRIDEMENT N/A 06/29/2018   Procedure: LUMBAR WOUND DEBRIDEMENT DRAINAGE AND IRRIGATION; AND ASPIRATION OF LEFT KNEE;  Surgeon: Lucilla Lynwood BRAVO, MD;  Location: [facility];  Service: Orthopedics;  Laterality: N/A;   SAVORY DILATION N/A 11/26/2022   Procedure: SAVORY DILATION;  Surgeon: San Sandor GAILS, DO;  [facility];  Service: Gastroenterology;  Laterality: N/A;   SPINE SURGERY  2000   TEE WITHOUT CARDIOVERSION N/A  07/01/2018   Procedure: TRANSESOPHAGEAL ECHOCARDIOGRAM (TEE);  Surgeon: Rolan Ezra RAMAN, MD;  Location[facility] END[facility];  Service: Cardiovascular;  Laterality: N/A;     Current Outpatient Medications on File Prior to Visit  Medication Sig Dispense Refill   amLODipine  (NORVASC ) 5 MG tablet Take 1 tablet (5 mg total) by mouth in the morning. 90 tablet 3   cetirizine (ZYRTEC) 10 MG tablet Take 10 mg by mouth daily as needed for allergies.     Cholecalciferol (VITAMIN D3 PO) Take 1 tablet by mouth daily.     Cyanocobalamin  (VITAMIN B-12 PO) Take 1 capsule by mouth daily.     DULoxetine  40 MG CPEP Take 1 capsule (40 mg total) by mouth daily. 30 capsule 3   FERROUS GLUCONATE  PO Take 1 tablet by mouth daily.     furosemide  (LASIX ) 20 MG tablet Take 0.5 tablets (10 mg total) by mouth daily. 90 tablet 3   gabapentin  (NEURONTIN ) 100 MG capsule Take 1 capsule (100 mg total) by mouth 2 (two) times daily. 60 capsule 3   polyethylene glycol (MIRALAX  / GLYCOLAX ) 17 g packet Take 17 g by mouth 2 (two) times daily. 14 each 0   potassium chloride  SA (KLOR-CON  M) 20 MEQ tablet Take 1 tablet (20 mEq total) by mouth daily. 90 tablet 3   Suzetrigine  50 MG TABS Take 50 mg by mouth every 12 (twelve) hours as needed. Take two 50mg  capsules for your first dose at the start of a pain episode on an empty stomach, followed by 50mg  (1 capsule)  with or without food every  12 hours until pain episode is improved 29 tablet 0   traMADol  (ULTRAM ) 50 MG tablet Take 1 tablet (50 mg total) by mouth 2 (two) times daily. 60 tablet 2   Vitamin D , Ergocalciferol , (DRISDOL) 1.25 MG (50000 UNIT) CAPS capsule Take 1 capsule (50,000 Units total) by mouth every 7 (seven) days. 12 capsule 0   No current facility-administered medications on file prior to visit.    Allergies  Allergen Reactions   Unasyn [Ampicillin-Sulbactam Sodium] Hives, Itching, Swelling and Rash    Vitals:   08/30/24 1006  BP: 132/86  Pulse: 79  SpO2: 97%        Physical exam:   General: The patient was alert and appears not in acute distress.  Mood and affect are appropriate .  The patient's interactions are: Cooperative, makes eye contact, follows the instructions and answers questions coherently.  The patient is groomed and appropriately groomed and dressed. Head: Normocephalic, atraumatic.  Neck is supple. Mallampati: 3 plus .  The neck circumference measured 20 inches. Nasal airflow was patent ,   Overbite / Retrognathia was noted.  Dental status: biological  Cardiovascular:  Regular rate and cardiac rhythm by palpable pulse. Respiratory: no audible wheezing, no tachypnoea.   Skin:  Without evidence of ankle edema. No discoloration.  Trunk:  BMI is presumed at 52 The patient's posture ; seated in an electric wheelchair.  He is able to transfer but not stand or walk .    Neurologic exam : The patient was awake and alert, oriented to place and time.   Attention span & concentration ability appeared normal.  Speech was fluent, without dysarthria, dysphonia or aphasia, and of normal volume.     Cranial nerves:  There was no loss of smell or taste reported  Pupils are round, equal in size and briskly reactive to light.  Funduscopic exam was deferred.  Extraocular movements in vertical and horizontal planes were intact and without nystagmus. (No Diplopia reported). Visual fields by finger perimetry are intact. Hearing was intact to soft voice.    Facial sensation intact to fine touch.  Facial motor strength: Symmetric movement and tongue and uvula move midline.  Neck ROM: rotation, tilt and flexion extension were intact for age and shoulder shrug was symmetrical.    Motor exam:  Symmetric bulk, strength and ROM.   Normal tone without cog- wheeling, and symmetric grip strength.   Sensory:  Fine touch and vibration were tested by tuning fork and intact.  Proprioception tested in the upper extremities was normal.    Coordination: The patient reported no problems with button closure and no changes to penmanship.   The Finger-to-nose maneuver was intact without evidence of ataxia, dysmetria or tremor.     Deep tendon reflexes: Upper extremities did show symmetric DTRs. Lower extremity DTRs were  attenuated.   Babinski response was deferred    I would like to thank Mercer Clotilda SAUNDERS, MD and Berkeley Adelita PENNER, Md [address] [place],  [place] [address] for allowing me to meet with with her patient.   ASSESSMENT :  In short, Mr Buczynski is presenting with a very high risk f of obesity hypoventilation , obstructive sleep apnea and sleep hypoxia. ,  Risk factors for OSA were present, including : Body mass index is 52.23 kg/m., neck size of 20  and upper airway anatomy. In addition, the patient is ambulatory , making it harder for him to reposition in bed, and he is also handicapped  by  inactivity when it comes to weight loss.  He is in chronic pain, has spinal cord injuries  causing neurogenic bowel and bladder.  He denies significant fatigue or sleepiness.   The restless legs he described are an all day long phenomenon,and may be spinal cord injury  related, described as myoclonus.   He has recently been checked for anemia and still is ANEMIC but the trend is upwards over 15 months.  .  He is prediabetic.  He has low vit D levels.     My Plan is to proceed with:  HST, can be Sansa or watch pat.   1) due to his non ambulatory state, I will be unable to provide in lab testing for him at [facility]  2) I will defer to a HST, screening for apnea and for hypoxia.   If the sleep study shows OSA to be present , will either use CPAP or BiPAP  to treat. He never used BiPAP in the past,  and used CPAP with a [facility].     I plan to follow up through our NP within 4-6 months.   A total time of  45  minutes consistent of a part of face to face encounter , exam and interview,  and additional  preparation time for chart review was spent .  At today's visit, we discussed treatment options, associated risk and benefits, and engage in counseling as needed including, but not limited to:  Sleep hygiene, Quality Sleep Habits, and Safety concerns for patients with daytime sleepiness who are warned to not operate machinery/ motor vehicles when drowsy. Risk factors for sleep apnea were identified:  364 lbs and height 5.10 .   Additionally, the following were reviewed: Past medical records, past medical and surgical history, family and social background, as well as relevant laboratory results, imaging findings, and medical notes, where applicable.  This note was generated by myself in part by using dictation software, and as a result, it may contain unintentional typos and errors.  Nevertheless, effort was made to accurately convey the pertinent aspects of the patient's visit.   Dedra Gores, MD   [facility] Sleep Board certified in Sleep Medicine by The [employer] of Sleep Medicine and Diplomate of the [employer] of [employer] (AASM) . Board certified In Neurology, Diplomat of the ABPN,  Fellow of the [employer] of Neu[employer].

## 2024-08-30 NOTE — Patient Instructions (Signed)
 ASSESSMENT :  In short, [name] [name] is presenting with a very high risk f of obesity hypoventilation , obstructive sleep apnea and sleep hypoxia. ,  Risk factors for OSA were present, including : Body mass index is 52.23 kg/m., neck size of 20  and upper airway anatomy. In addition, the patient is ambulatory , making it harder for him to reposition in bed, and he is also handicapped  by inactivity when it comes to weight loss.  He is in chronic pain, has spinal cord injuries  causing neurogenic bowel and bladder.  He denies significant fatigue or sleepiness.    The restless legs he described are an all day long phenomenon,and may be spinal cord injury  related, described as myoclonus.    He has recently been checked for anemia and still is ANEMIC but the trend is upwards over 15 months.  .  He is prediabetic.  He has low vit D levels.       My Plan is to proceed with:   HST, can be Sansa or watch pat.    1) due to his non ambulatory state, I will be unable to provide in lab testing for him at [facility]  2) I will defer to a HST, screening for apnea and for hypoxia.    If the sleep study shows OSA to be present , will either use CPAP or BiPAP  to treat. He never used BiPAP in the past,  and used CPAP with a FFM.        I plan to follow up through our NP within 4-6 months.

## 2024-09-01 ENCOUNTER — Ambulatory Visit (INDEPENDENT_AMBULATORY_CARE_PROVIDER_SITE_OTHER): Admitting: Family Medicine

## 2024-09-21 ENCOUNTER — Encounter: Payer: Self-pay | Admitting: Physical Medicine & Rehabilitation

## 2024-09-21 ENCOUNTER — Encounter: Attending: Physical Medicine & Rehabilitation | Admitting: Physical Medicine & Rehabilitation

## 2024-09-21 VITALS — BP 127/66 | HR 94 | Ht 70.0 in

## 2024-09-21 DIAGNOSIS — M1712 Unilateral primary osteoarthritis, left knee: Secondary | ICD-10-CM | POA: Diagnosis present

## 2024-09-21 DIAGNOSIS — G894 Chronic pain syndrome: Secondary | ICD-10-CM | POA: Insufficient documentation

## 2024-09-21 MED ORDER — SUZETRIGINE 50 MG PO TABS: 50.0000 mg | ORAL_TABLET | Freq: Two times a day (BID) | ORAL | 0 refills | Status: DC | PRN

## 2024-09-21 NOTE — Progress Notes (Signed)
 Subjective:    Patient ID: [name] [name], male    DOB: [birth year], 67 y.o.   MRN: [id number]  HPI  HPI  [name] [name] is a 68 y.o. year old male  who  has a past medical history of Anemia, Arthritis, Chronic kidney disease, GERD (gastroesophageal reflux disease), History of kidney stones, Hypertension, Morbid obesity (HCC), Neuromuscular disorder (HCC), Obstructive sleep apnea, Osteoporosis, Prurigo (2002), Scars, Sleep apnea, and Spinal stenosis.   They are presenting to PM&R clinic as a new patient for pain management evaluation. They were referred by Dr. Clotilda Single MD for treatment of spinal stenosis pain.  Back pain has been present for over 10 years.  He played football when he was younger and this this contributed. He saw Dr. Lucilla in the past, patient has a history of lumbar multilevel laminectomy for recurrent lumbar spinal stenosis, in 2019. He reports that he was walking with cane or walker at this time. Around this time he began not taking the best care of himself.  He started drinking more alcohol and gained weight.   He stopped exercising around this time. He developed worsening weakness in his legs greater than arms.  He later had neck surgery with ACDF C3-C6 completed by Dr. Malcolm 12/07/2018 and completed rehab again.  Patient had continued worsening of the weakness in his legs and has been nonambulatory for several years.  He now has a chronic indwelling Foley catheter, reports this was placed for prostate issues.  Denies any difficulty with bowel function but chart review indicates he has had issues with ileus/atonic colon in the past.  Patient was also seen by Dr. Vernetta for knee osteoarthritis previously.  Patient reports knees hurt more laying in bed and sitting in his wheelchair.  He has pain in his right lower back going down his legs.  Patient reports tramadol  50 mg twice daily overall is keeping his pain under good control.   Red flag symptoms: No red flags  for back pain endorsed in Hx or ROS, saddle anesthesia, loss of bowel or bladder continence, new weakness, new numbness/tingling, and pain waking up at nighttime.  Medications tried: Topical medications- Hemp cream - did not help much- used today Voltaren  helps a little  Nsaids  Aleve- doesn't help much  Tylenol   doesn't help much  Opiates  Tramadol  helps his pain  Oxycodone  years ago- last used after neck surgery  Gabapentin  / Lyrica  - Denies  TCAs -Denies  SNRIs  - Denies     Other treatments: PT/OT - CIR in 2020, just started PT, session today planned TENs unit - Denies  Injections- Years ago had ESI? - Helped just a little  Surgery - L spine and C spine surgery  Goals for pain control:  Be a little more comfortable overall    Prior UDS results: No results found for: LABOPIA, COCAINSCRNUR, LABBENZ, AMPHETMU, THCU, LABBARB   Interval history 05/04/2024  Patient is here for left knee injection.  Continues to have pain in his left knee, it is worsened after transfers or movements of his left knee.  He does report benefit to his pain with gabapentin .  He was unable to tolerate taking it 3 times a day.  He does tolerate taking it twice a day and it helps with this pain.  Patient continues to have episodes of pain flares of worsening pain in his back and legs.  Interval history 07/06/24 Reports pain is much better overall.- The left knee cortisone injection  from 05/04/2024 is starting to wear off after approximately 2 months.  - Genavix (Journavix): Reports it is working perfectly for occasional use. Currently has 5 pills remaining. A refill will be sent. Discussed medications for intermittent use of pain exacerbations. - Gabapentin : Was prescribed twice a day but was causing excessive sleepiness. Has self-reduced to once daily, which is better tolerated. - Cymbalta  (duloxetine ): Discussed as a new daily medication option for joint/nerve pain. He is agreeable to starting.  Discussed common side effects.  - PMHx: History of kidney stones. No history of liver problems. - Social: He is 68 years old.  - Bowel/Bladder: No issues reported. Bowels are moving well. - Constitutional: No new concerns.  Patient continues on tramadol  ordered by different provider.  Interval History 08/10/24 Reports left knee pain. The left knee is worse than the right. Previous injection provided relief; for a couple of months.  Medications - Cymbalta : Currently taking a low dose, which is helping with pain. No side effects reported. - Tramadol : Not used recently, but it was effective when used in the past. Ordered by different provider.  - Journavx : Has some available for occasional use and finds it helpful.  Interval History 09/21/24 Reports follow-up for left knee pain. A previous cortisone injection provided approximately 2-3 weeks of relief, but the effect has now worn off. The left knee is currently the main source of pain. Back pain has improved. Neck pain is well-controlled. Reports no changes in bowel or bladder function.  Medications: - Cymbalta  40mg  is effective for back pain and will be continued at the current dose. - Gabapentin  100mg  is used as needed. - Has run out of Journavx . A new prescription will be sent to [facility] at [place] for use during severe episodes.   Pain Inventory Average Pain 5 Pain Right Now 0 My pain is sharp  In the last 24 hours, has pain interfered with the following? General activity 0 Relation with others 0 Enjoyment of life 5 What TIME of day is your pain at its worst? night Sleep (in general) Fair  Pain is worse with: sitting and inactivity Pain improves with: heat/ice, heat, anti-inflammatory medication, Voltaren  Cream Relief from Meds: 7      Family History  Problem Relation Age of Onset   Hypertension Mother    Diabetes Mother    Hypertension Father    Heart disease Father    Sudden death Father    Colon  cancer Neg Hx    Colon polyps Neg Hx    Esophageal cancer Neg Hx    Rectal cancer Neg Hx    Stomach cancer Neg Hx    Social History   Socioeconomic History   Marital status: Married    Spouse name: Not on file   Number of children: Not on file   Years of education: Not on file   Highest education level: Some college, no degree  Occupational History   Occupation: Retired  Tobacco Use   Smoking status: Former    Current packs/day: 0.00    Types: Cigarettes    Quit date: 04/09/1974    Years since quitting: 50.4   Smokeless tobacco: Never  Vaping Use   Vaping status: Never Used  Substance and Sexual Activity   Alcohol use: No   Drug use: No   Sexual activity: Not Currently    Birth control/protection: None  Other Topics Concern   Not on file  Social History Narrative   1 cup of coffee every morning  Social Drivers of Corporate Investment Banker Strain: Low Risk  (06/07/2024)   Overall Financial Resource Strain (CARDIA)    Difficulty of Paying Living Expenses: Not hard at all  Food Insecurity: No Food Insecurity (06/07/2024)   Hunger Vital Sign    Worried About Running Out of Food in the Last Year: Never true    Ran Out of Food in the Last Year: Never true  Transportation Needs: No Transportation Needs (06/07/2024)   PRAPARE - Administrator, Civil Service (Medical): No    Lack of Transportation (Non-Medical): No  Physical Activity: Insufficiently Active (06/07/2024)   Exercise Vital Sign    Days of Exercise per Week: 2 days    Minutes of Exercise per Session: 10 min  Stress: No Stress Concern Present (06/07/2024)   [employer] of Occupational Health - Occupational Stress Questionnaire    Feeling of Stress: Not at all  Social Connections: Socially Isolated (06/07/2024)   Social Connection and Isolation Panel    Frequency of Communication with Friends and Family: Once a week    Frequency of Social Gatherings with Friends and Family: Once a week     Attends Religious Services: Never    Database Administrator or Organizations: No    Attends Engineer, Structural: Not on file    Marital Status: Married   Past Surgical History:  Procedure Laterality Date   ANTERIOR CERVICAL DECOMP/DISCECTOMY FUSION N/A 12/07/2018   Procedure: ANTERIOR CERVICAL DECOMPRESSION FUSION - CERVICAL THREE-CERVICAL FOUR, CERVICAL FOUR-CERVICAL FIVE, CERVICAL FIVE-CERVICAL SIX;  Surgeon: Louis Shove, MD;  Location: [facility];  Service: Neurosurgery;  Laterality: N/A;   BACK SURGERY  01/23/2010   lumbar   BIOPSY  11/26/2022   Procedure: BIOPSY;  Surgeon: San Sandor GAILS, DO;  Location: [facility];  Service: Gastroenterology;;   CHOLECYSTECTOMY N/A 03/24/2016   Procedure: LAPAROSCOPIC CHOLECYSTECTOMY WITH INTRAOPERATIVE CHOLANGIOGRAM;  Surgeon: Deward Null III, MD;  Location: [facility];  Service: General;  Laterality: N/A;   CIRCUMCISION     CYSTOSCOPY WITH RETROGRADE PYELOGRAM, URETEROSCOPY AND STENT PLACEMENT Left 01/20/2014   Procedure: CYSTOSCOPY WITH RETROGRADE PYELOGRAM, URETEROSCOPY AND STENT EXCHANGE;  Surgeon: Toribio Neysa Repine, MD;  Location: [facility];  Service: Urology;  Laterality: Left;  bugbee bladder fulguration   CYSTOSCOPY WITH STENT PLACEMENT Left 12/28/2013   Procedure: CYSTOSCOPY WITH STENT PLACEMENT left retrograde pyleogram;  Surgeon: Toribio Neysa Repine, MD;  Location: [facility];  Service: Urology;  Laterality: Left;   CYSTOSCOPY/URETEROSCOPY/HOLMIUM LASER/STENT PLACEMENT Right 05/15/2021   Procedure: CYSTOSCOPY RIGHT RETROGRADE RIGHT URETEROSCOPY/HOLMIUM LASER/STENT PLACEMENT;  Surgeon: Watt Rush, MD;  Location: [facility];  Service: Urology;  Laterality: Right;   CYSTOSCOPY/URETEROSCOPY/HOLMIUM LASER/STENT PLACEMENT Right 07/08/2023   Procedure: CYSTOSCOPY, RIGHT RETROGRADE PYELOGRAM, RIGHT URETERAL STENT PLACEMENT;  Surgeon: Watt Rush, MD;  Location: [facility];  Service: Urology;  Laterality: Right;  60 MINS FOR CASE    CYSTOSCOPY/URETEROSCOPY/HOLMIUM LASER/STENT PLACEMENT Right 08/12/2023   Procedure: CYSTOSCOPY RIGHT   URETEROSCOPY/HOLMIUM LASER/STENT EXCHANGE;  Surgeon: Watt Rush, MD;  Location: [facility];  Service: Urology;  Laterality: Right;   ESOPHAGOGASTRODUODENOSCOPY N/A 12/07/2012   Procedure: ESOPHAGOGASTRODUODENOSCOPY (EGD);  Surgeon: Redell Faith, DO;  Location: [facility];  Service: General;  Laterality: N/A;   ESOPHAGOGASTRODUODENOSCOPY (EGD) WITH PROPOFOL  N/A 11/26/2022   Procedure: ESOPHAGOGASTRODUODENOSCOPY (EGD) WITH PROPOFOL ;  Surgeon: San Sandor GAILS, DO;  Location: [facility];  Service: Gastroenterology;  Laterality: N/A;   EYE SURGERY Bilateral 2024   cataracts removed   FLEXIBLE SIGMOIDOSCOPY N/A 05/01/2023  Procedure: FLEXIBLE SIGMOIDOSCOPY;  Surgeon: Avram Lupita BRAVO, MD;  Location: [facility];  Service: Gastroenterology;  Laterality: N/A;   FLEXIBLE SIGMOIDOSCOPY N/A 08/17/2023   Procedure: FLEXIBLE SIGMOIDOSCOPY;  Surgeon: Rollin Dover, MD;  Location: [facility];  Service: Gastroenterology;  Laterality: N/A;   HOLMIUM LASER APPLICATION Left 01/20/2014   Procedure: HOLMIUM LASER APPLICATION;  Surgeon: Toribio Neysa Repine, MD;  Location: [facility];  Service: Urology;  Laterality: Left;   KNEE ARTHROSCOPY Left 06/29/2018   Procedure: ARTHROSCOPY KNEE;  Surgeon: Lucilla Lynwood BRAVO, MD;  Location: [facility] OR;  Service: Orthopedics;  Laterality: Left;   LAPAROSCOPIC GASTRIC SLEEVE RESECTION N/A 12/07/2012   Procedure: LAPAROSCOPIC GASTRIC SLEEVE RESECTION;  Surgeon: Redell Faith, DO;  Location: [facility];  Service: General;  Laterality: N/A;  laparoscopic sleeve gastrectomy with EGD   LUMBAR LAMINECTOMY N/A 11/17/2017   Procedure: L2-3 LAMINECTOMY AND REDO LAMINECTOMIES  L3-4, L4-5 AND L5-S1;  Surgeon: Lucilla Lynwood BRAVO, MD;  Location: [facility];  Service: Orthopedics;  Laterality: N/A;   LUMBAR WOUND DEBRIDEMENT N/A 06/29/2018   Procedure: LUMBAR WOUND DEBRIDEMENT DRAINAGE AND IRRIGATION; AND ASPIRATION  OF LEFT KNEE;  Surgeon: Lucilla Lynwood BRAVO, MD;  Location: [facility];  Service: Orthopedics;  Laterality: N/A;   SAVORY DILATION N/A 11/26/2022   Procedure: SAVORY DILATION;  Surgeon: San Sandor GAILS, DO;  Location: [facility];  Service: Gastroenterology;  Laterality: N/A;   SPINE SURGERY  2000   TEE WITHOUT CARDIOVERSION N/A 07/01/2018   Procedure: TRANSESOPHAGEAL ECHOCARDIOGRAM (TEE);  Surgeon: Rolan Ezra RAMAN, MD;  [facility];  Service: Cardiovascular;  Laterality: N/A;   Past Medical History:  Diagnosis Date   Anemia    Arthritis    Chronic kidney disease    HX acute kidney failure / acute pyelonephritis / hydronephrosis / severe sepsis per discharge summary 12/27/13   GERD (gastroesophageal reflux disease)    History of kidney stones    Hypertension    Morbid obesity (HCC)    Neuromuscular disorder (HCC)    Obstructive sleep apnea    does not need c pap since 110 lb wt loss   Osteoporosis    Prurigo 2002   Scars    ON ARMS FROM CHEMICAL EXPLOSION 1999   Sleep apnea    Spinal stenosis    There were no vitals taken for this visit.  Opioid Risk Score:   Fall Risk Score:  `1  Depression screen [facility] 2/9     08/10/2024   11:47 AM 07/28/2024   10:07 AM 07/06/2024   11:15 AM 06/23/2024   10:18 AM 06/08/2024   10:29 AM 04/21/2024    4:27 PM 03/04/2024   11:03 AM  Depression screen [facility] 2/9  Decreased Interest 0 0 0 0 0 1 1  Down, Depressed, Hopeless 0 0 0 0 0 0 0  PHQ - 2 Score 0 0 0 0 0 1 1  Altered sleeping  0  0 0 0 0  Tired, decreased energy  1  1 0 1 1  Change in appetite  0  0 2 1 0  Feeling bad or failure about yourself   0  0 0 1 1  Trouble concentrating  0  0 0 0 0  Moving slowly or fidgety/restless  0  0 0 0 0  Suicidal thoughts  0  0 0 1 0  PHQ-9 Score  1   1  2  5  3    Difficult doing work/chores    Not difficult at all  Not difficult at all     Data saved with a previous flowsheet row definition     Review of Systems  Musculoskeletal:  Positive  for arthralgias and back pain.       Left knee pain  Skin:  Positive for rash.       Skin rash/breakdown, easy bleeding  Neurological:        Bladder control problems, bowel control problems, weakness, numbness, trouble walking  All other systems reviewed and are negative.      Objective:   Physical Exam  Gen: no distress, obese HEENT: oral mucosa pink and moist, NCAT Chest: normal effort, normal rate of breathing Abd: soft, non-distended Ext: no edema Psych: pleasant, normal affect Skin: intact Neuro: Alert and awake, follows commands, cranial nerves II through XII grossly intact, normal speech and language Bilateral lower extremity weakness Sensory exam intact light touch in bilateral upper extremities, decreased to absent in bilateral lower extremities  Musculoskeletal:  L knee TTP medial Mild L-spine tenderness to palpation Sitting in power wheelchair Limited active shoulder abduction b/l - about 90 degrees   Prior exam Decreased left shoulder active ROM, able to passively ABduct significantly above his head Left hand unable to extend digits 3 and 4 TTP right lumbar spine paraspinal muscles Using a power wheelchair for mobility Bilateral knee joint line TTP  MRI L spine 04/30/23 EXAM: MRI LUMBAR SPINE WITHOUT AND WITH CONTRAST   TECHNIQUE: Multiplanar and multiecho pulse sequences of the lumbar spine were obtained without and with intravenous contrast.   CONTRAST:  10mL GADAVIST  GADOBUTROL  1 MMOL/ML IV SOLN   COMPARISON:  MRI of the lumbar spine November 03, 2018.   FINDINGS: Segmentation:  Standard.   Alignment: Exaggerated lumbar lordosis with small anterolisthesis of L4 over L5 and L5 over S1.   Vertebrae: No fracture, evidence of discitis, or bone lesion. Congenitally small spinal canal. L2-3, L3-4, L4-5 and L5-S1 laminectomies. Loss of disc height with partial endplate fusion at L4-5 and L5-S1. Prominent posterior element hypertrophy in the  lower lumbar spine.   Conus medullaris and cauda equina: Conus extends to the L2 level. Conus appear normal.   Paraspinal and other soft tissues: Diffuse fatty atrophy of the paraspinal muscles.   Disc levels:   T12-L1: Shallow disc bulge and moderate hypertrophic facet degenerative changes resulting in mild spinal canal stenosis and mild bilateral neural foraminal narrowing.   L1-2: Shallow disc bulge and moderate hypertrophic facet degenerative changes resulting in mild spinal canal stenosis and severe bilateral neural foraminal narrowing.   L2-3: Disc bulge and prominent posterior element hypertrophy resulting in severe spinal canal stenosis and severe bilateral neural foraminal narrowing.   L3-4: Disc bulge and prominent posterior elements hypertrophy resulting in severe spinal canal stenosis, severe right and moderate left neural foraminal narrowing.   L4-5: Anterolisthesis, endplate ridging and posterior elements hypertrophy and epidural lipomatosis. Findings result in moderate to severe narrowing of the thecal sac, prominent narrowing of the bilateral subarticular zones, severe right and moderate left neural foraminal narrowing.   L5-S1: Small anterolisthesis, endplate ridging and posterior element hypertrophy resulting in moderate bilateral neural foraminal narrowing. Mild spinal canal stenosis.   IMPRESSION: 1. Degenerative changes of the lumbar spine superimposed on a congenitally small spinal canal resulting in severe spinal canal stenosis at L2-3 and L3-4 and moderate to severe narrowing of the thecal sac at L4-5. 2. Severe bilateral neural foraminal narrowing at L1-2 and L2-3. 3. Severe right and moderate left neural foraminal narrowing at L3-4  and L4-5. 4. Moderate bilateral neural foraminal narrowing at L5-S1.  L knee xray 2019 No acute fracture.   Moderate to severe osteoarthritic change.  Joint effusion.    Assessment & Plan:  1) Spinal cord  injury. He history of severe lumbar stenosis and prior lumbar surgery 2) History of  C spine ACDF 3) Morbid Obesity There is no height or weight on file to calculate BMI. 4) Hx CKD, avoid nephrotoxic medications  - Creatinine 0.65/BUN 20 on 01/08/2024. 5) Neurogenic bladder with Chronic Foley 6) Possible neurogenic bowel 7) Bilateral knee pain likely due to osteoarthritis, left > right   Moderate to severe osteoarthritic change.  Joint effusion. 8) Chronic lower back pain  Currently he is nonopioid status at this clinic.  UDS showed hydrocodone  and this was inconsistent Opioid risk tool low Discussed TENS unit at prior visit, nexwave was ordered Continue gabapentin  100 mg daily, he reports sedation with higher doses Continue monitor bowel and bladder function-reports no concerns at this time Will schedule left knee cortisone injection next visit Patient denies history of diabetes or liver dysfunction Continue Journavx  for acute pain episodes. - ordered -Continue Cymbalta  to 40 mg daily - Patient would like to consider formal physical therapy until some weight loss is achieved. -Will attempt to get prior authorization for Zilretta  (long-acting cortisone) for the next injection. If not approved or cost is prohibitive, will proceed with a standard cortisone injection.

## 2024-09-23 MED ORDER — SUZETRIGINE 50 MG PO TABS: 50.0000 mg | ORAL_TABLET | Freq: Two times a day (BID) | ORAL | 1 refills | Status: AC | PRN

## 2024-09-23 NOTE — Addendum Note (Signed)
 Addended by: URBANO ALBRIGHT on: 09/23/2024 05:13 PM   Modules accepted: Orders

## 2024-09-27 ENCOUNTER — Ambulatory Visit (INDEPENDENT_AMBULATORY_CARE_PROVIDER_SITE_OTHER): Admitting: Family Medicine

## 2024-09-27 ENCOUNTER — Encounter (INDEPENDENT_AMBULATORY_CARE_PROVIDER_SITE_OTHER): Payer: Self-pay | Admitting: Family Medicine

## 2024-09-27 VITALS — BP 128/88 | HR 68 | Temp 97.6°F | Ht 70.0 in | Wt 361.0 lb

## 2024-09-27 DIAGNOSIS — Z6841 Body Mass Index (BMI) 40.0 and over, adult: Secondary | ICD-10-CM | POA: Diagnosis not present

## 2024-09-27 DIAGNOSIS — M175 Other unilateral secondary osteoarthritis of knee: Secondary | ICD-10-CM | POA: Diagnosis not present

## 2024-09-27 DIAGNOSIS — I10 Essential (primary) hypertension: Secondary | ICD-10-CM

## 2024-09-27 NOTE — Progress Notes (Signed)
" ° °  SUBJECTIVE:  Chief Complaint: Obesity  Interim History: Patient here for first appointment since end of October.  He had to cancel his last appointment due to GI illness.  He mentions his Thanksgiving was quite and he stayed home and watched football.  He went slightly overboard on Thanksgiving with pie but he was able to get back on plan.  He has been consistently eating all food on plan.  He has cut back on the quantity of which he was drinking.  He has decreased his alcohol consumption.  Over the next few weeks he is not anticipating much in terms of dietary consumption of indulgence.  He does mention he needs to be more mindful of his alcohol consumption.  He is going to [place] for Christmas.    [name] [name] is here to discuss his progress with his obesity treatment plan. He is on the Category 3 Plan and states he is following his eating plan approximately 100 % of the time. He states he is upper body exercising 60 minutes 4 times per week and using resistance bands.   OBJECTIVE: Visit Diagnoses: Problem List Items Addressed This Visit       Musculoskeletal and Integument   Knee osteoarthritis - Primary   Other Visit Diagnoses       Essential hypertension         BMI 50.0-59.9, adult (HCC)         Morbid obesity (HCC)           Vitals Temp: 97.6 F (36.4 C) BP: 128/88 Pulse Rate: 68 SpO2: 98 %   Anthropometric Measurements Height: 5' 10 (1.778 m) Weight: (!) 361 lb (163.7 kg) BMI (Calculated): 51.8 Weight at Last Visit: 364 lb Starting Weight: 371 lb Total Weight Loss (lbs): 10 lb (4.536 kg)   No data recorded Other Clinical Data Today's Visit #: 3 Starting Date: 07/28/24 Comments: Cat 3     ASSESSMENT AND PLAN: Assessment & Plan Other secondary osteoarthritis of left knee Patient is working on dietary changes to achieve weight loss and to be able to qualify for knee replacement.  Will continue to follow-up on patient's orthopedic plan as patient  progresses through program at this clinic. Essential hypertension Blood pressure well-controlled today.  Patient is on amlodipine  with no side Effects mentioned.  Will continue to monitor blood pressure at upcoming Appointments BMI 50.0-59.9, adult (HCC)  Morbid obesity (HCC)    Diet: [name] [name] is currently in the action stage of change. As such, his goal is to continue with weight loss efforts and has agreed to the Category 4 Plan.   Exercise:  Older adults should determine their level of effort for physical activity relative to their level of fitness.  Behavior Modification:  We discussed the following Behavioral Modification Strategies today: increasing lean protein intake, decreasing simple carbohydrates, meal planning and cooking strategies, and holiday eating strategies.   Return in about 4 weeks (around 10/25/2024).   He was informed of the importance of frequent follow up visits to maximize his success with intensive lifestyle modifications for his multiple health conditions.  Attestation Statements:   Reviewed by clinician on day of visit: allergies, medications, problem list, medical history, surgical history, family history, social history, and previous encounter notes.   [name] Cho, MD "

## 2024-10-11 NOTE — Assessment & Plan Note (Signed)
 Patient is working on dietary changes to achieve weight loss and to be able to qualify for knee replacement.  Will continue to follow-up on patient's orthopedic plan as patient progresses through program at this clinic.

## 2024-10-25 ENCOUNTER — Ambulatory Visit: Admitting: Family Medicine

## 2024-10-27 ENCOUNTER — Encounter: Payer: Self-pay | Admitting: Neurology

## 2024-11-01 ENCOUNTER — Encounter (INDEPENDENT_AMBULATORY_CARE_PROVIDER_SITE_OTHER): Payer: Self-pay | Admitting: Family Medicine

## 2024-11-01 ENCOUNTER — Ambulatory Visit (INDEPENDENT_AMBULATORY_CARE_PROVIDER_SITE_OTHER): Admitting: Family Medicine

## 2024-11-01 VITALS — BP 112/73 | HR 103 | Temp 97.6°F | Ht 70.0 in | Wt 360.0 lb

## 2024-11-01 DIAGNOSIS — D638 Anemia in other chronic diseases classified elsewhere: Secondary | ICD-10-CM

## 2024-11-01 DIAGNOSIS — Z6841 Body Mass Index (BMI) 40.0 and over, adult: Secondary | ICD-10-CM | POA: Diagnosis not present

## 2024-11-01 DIAGNOSIS — E559 Vitamin D deficiency, unspecified: Secondary | ICD-10-CM | POA: Diagnosis not present

## 2024-11-01 MED ORDER — VITAMIN D (ERGOCALCIFEROL) 1.25 MG (50000 UNIT) PO CAPS: 50000.0000 [IU] | ORAL_CAPSULE | ORAL | 0 refills | Status: AC

## 2024-11-01 NOTE — Progress Notes (Signed)
" ° °  SUBJECTIVE:  Chief Complaint: Obesity  Interim History: Patient had a great Christmas but New Year's was less pleasant/.  He was ill over the New Year.  He hasn't done his repeat sleep study yet- he called sleep office twice and left a message and did get on the phone with the office once.  His brother is throwing him a party for his birthday in the next few weeks.  There will also be food catering.  He has been doing well getting more aligned with food plan.  He restarted January 4th to get more consistent and prior to that he was splurging more frequently.  Denies any changes that need to be made to the plan over the next few weeks.   [name] [name] is here to discuss his progress with his obesity treatment plan. He is on the Category 4 Plan and states he is following his eating plan approximately 100 % of the time. He states he is exercising 60 minutes 3 times per week.   OBJECTIVE: Visit Diagnoses: Problem List Items Addressed This Visit       Other   Anemia of chronic disease - Primary   Other Visit Diagnoses       Vitamin D  deficiency       Relevant Medications   Vitamin D , Ergocalciferol , (DRISDOL ) 1.25 MG (50000 UNIT) CAPS capsule     BMI 50.0-59.9, adult (HCC)         Morbid obesity (HCC)           Vitals Temp: 97.6 F (36.4 C) BP: 112/73 Pulse Rate: (!) 103 SpO2: 98 %   Anthropometric Measurements Height: 5' 10 (1.778 m) Weight: (!) 360 lb (163.3 kg) BMI (Calculated): 51.65 Weight at Last Visit: 361 lb Starting Weight: 371 lb Total Weight Loss (lbs): 11 lb (4.99 kg)   No data recorded Other Clinical Data Today's Visit #: 4 Starting Date: 07/28/24 Comments: Cat 4     ASSESSMENT AND PLAN: Assessment & Plan Vitamin D  deficiency Tolerating prescription strength Vitamin D  and needs refill today.  No nausea, vomiting or muscle weakness mentioned.  Refill sent to pharmacy. Anemia of chronic disease Getting labs done on 11/07/24 to recheck blood counts and  anemia panel.  Follow up on labs at that time. BMI 50.0-59.9, adult (HCC)  Morbid obesity (HCC)    Diet: Chastin is currently in the action stage of change. As such, his goal is to continue with weight loss efforts and has agreed to the Category 4 Plan.   Exercise:  Older adults should determine their level of effort for physical activity relative to their level of fitness.  Behavior Modification:  We discussed the following Behavioral Modification Strategies today: increasing lean protein intake, decreasing simple carbohydrates, increasing vegetables, meal planning and cooking strategies, celebration eating strategies, and planning for success.   Return in about 4 weeks (around 11/29/2024).   He was informed of the importance of frequent follow up visits to maximize his success with intensive lifestyle modifications for his multiple health conditions.  Attestation Statements:   Reviewed by clinician on day of visit: allergies, medications, problem list, medical history, surgical history, family history, social history, and previous encounter notes.     Adelita Cho, MD "

## 2024-11-01 NOTE — Assessment & Plan Note (Signed)
 Getting labs done on 11/07/24 to recheck blood counts and anemia panel.  Follow up on labs at that time.

## 2024-11-02 ENCOUNTER — Telehealth: Payer: Self-pay | Admitting: Family Medicine

## 2024-11-02 ENCOUNTER — Encounter (INDEPENDENT_AMBULATORY_CARE_PROVIDER_SITE_OTHER): Payer: Self-pay | Admitting: Family Medicine

## 2024-11-02 NOTE — Telephone Encounter (Signed)
 Check PA!!!

## 2024-11-02 NOTE — Telephone Encounter (Signed)
 Copied from CRM [id number]. Topic: Referral - Request for Referral >> Nov 02, 2024  2:12 PM Roselie BROCKS wrote: Did the patient discuss referral with their provider in the last year? Yes (If No - schedule appointment) (If Yes - send message)  Appointment offered? No  Type of order/referral and detailed reason for visit: Patient had issues with [facility] wants to change   Preference of office, provider, location: any   If referral order, have you been seen by this specialty before? No (If Yes, this issue or another issue? When? Where?  Can we respond through MyChart? Yes

## 2024-11-03 ENCOUNTER — Encounter: Payer: Self-pay | Admitting: Family Medicine

## 2024-11-03 ENCOUNTER — Encounter (INDEPENDENT_AMBULATORY_CARE_PROVIDER_SITE_OTHER): Payer: Self-pay | Admitting: Family Medicine

## 2024-11-03 ENCOUNTER — Other Ambulatory Visit (INDEPENDENT_AMBULATORY_CARE_PROVIDER_SITE_OTHER): Payer: Self-pay | Admitting: Family Medicine

## 2024-11-03 DIAGNOSIS — G4733 Obstructive sleep apnea (adult) (pediatric): Secondary | ICD-10-CM

## 2024-11-03 MED ORDER — ZEPBOUND 2.5 MG/0.5ML ~~LOC~~ SOAJ: 2.5000 mg | SUBCUTANEOUS | 0 refills | Status: AC

## 2024-11-03 NOTE — Telephone Encounter (Signed)
 Ok for referral to [facility] for urinary cath change monthly.  Last referral to [facility] was for same reason, but looks like HH evaluated pt and recommended wound care, unclear if they continued services for cath exchange and checking vitals.

## 2024-11-04 NOTE — Telephone Encounter (Signed)
 Called and spoke with patient, the patient would like the home health order for urinary cath change and checking vitals, patient states that his personal Aide is doing the wound care for him at this time.

## 2024-11-09 ENCOUNTER — Encounter: Admitting: Physical Medicine & Rehabilitation

## 2024-11-12 ENCOUNTER — Encounter

## 2024-11-12 DIAGNOSIS — Z8669 Personal history of other diseases of the nervous system and sense organs: Secondary | ICD-10-CM

## 2024-11-12 DIAGNOSIS — E669 Obesity, unspecified: Secondary | ICD-10-CM

## 2024-11-12 DIAGNOSIS — E662 Morbid (severe) obesity with alveolar hypoventilation: Secondary | ICD-10-CM

## 2024-11-12 DIAGNOSIS — Z9189 Other specified personal risk factors, not elsewhere classified: Secondary | ICD-10-CM

## 2024-11-16 ENCOUNTER — Telehealth: Payer: Self-pay

## 2024-11-16 NOTE — Telephone Encounter (Signed)
 Copied from CRM [id number]. Topic: Clinical - Home Health Verbal Orders >> Nov 16, 2024  3:28 PM China J wrote: Caller/Agency: [facility]  Callback Number: [phone]  Secured voicemail box Service Requested: Skilled Nursing Frequency: 1x a month if it's just the foley. Will call back if needing more time. Any new concerns about the patient? Yes Catheter is needing to be changed on a monthly basis. This order is for re-emission. Patient is also needing another appointment with Dr. Mercer. Her nurse will call the patient so he can reach out and get that taken care of.

## 2024-11-17 NOTE — Telephone Encounter (Signed)
 Ok.

## 2024-11-17 NOTE — Telephone Encounter (Signed)
 See other note

## 2024-11-17 NOTE — Telephone Encounter (Signed)
 Called [facility]  [facility] left a VM with OV per Dr. Mercer for Foley

## 2024-11-17 NOTE — Telephone Encounter (Signed)
 Spoke with patient, patient is going back with [facility], VO given for Foley care per Dr. Mercer. Patient has a follow-up appt 2/13

## 2024-11-22 ENCOUNTER — Encounter: Attending: Physical Medicine & Rehabilitation | Admitting: Physical Medicine & Rehabilitation

## 2024-11-26 ENCOUNTER — Ambulatory Visit: Admitting: Family Medicine

## 2024-11-29 ENCOUNTER — Ambulatory Visit (INDEPENDENT_AMBULATORY_CARE_PROVIDER_SITE_OTHER): Admitting: Family Medicine
